# Patient Record
Sex: Female | Born: 1940 | Race: White | Hispanic: No | Smoking: Former smoker
Health system: Southern US, Community
[De-identification: ages and names within clinical notes are randomized; demographics above are authoritative.]

## PROBLEM LIST (undated history)

## (undated) DIAGNOSIS — I739 Peripheral vascular disease, unspecified: Secondary | ICD-10-CM

## (undated) DIAGNOSIS — K219 Gastro-esophageal reflux disease without esophagitis: Secondary | ICD-10-CM

## (undated) DIAGNOSIS — I480 Paroxysmal atrial fibrillation: Secondary | ICD-10-CM

## (undated) DIAGNOSIS — I779 Disorder of arteries and arterioles, unspecified: Secondary | ICD-10-CM

## (undated) DIAGNOSIS — J45909 Unspecified asthma, uncomplicated: Secondary | ICD-10-CM

## (undated) DIAGNOSIS — IMO0002 Reserved for concepts with insufficient information to code with codable children: Secondary | ICD-10-CM

## (undated) DIAGNOSIS — I771 Stricture of artery: Secondary | ICD-10-CM

## (undated) DIAGNOSIS — I10 Essential (primary) hypertension: Secondary | ICD-10-CM

## (undated) DIAGNOSIS — E785 Hyperlipidemia, unspecified: Secondary | ICD-10-CM

## (undated) DIAGNOSIS — I447 Left bundle-branch block, unspecified: Secondary | ICD-10-CM

## (undated) DIAGNOSIS — I4891 Unspecified atrial fibrillation: Secondary | ICD-10-CM

## (undated) DIAGNOSIS — I639 Cerebral infarction, unspecified: Secondary | ICD-10-CM

## (undated) DIAGNOSIS — T7840XA Allergy, unspecified, initial encounter: Secondary | ICD-10-CM

## (undated) DIAGNOSIS — J42 Unspecified chronic bronchitis: Secondary | ICD-10-CM

## (undated) DIAGNOSIS — I701 Atherosclerosis of renal artery: Secondary | ICD-10-CM

## (undated) DIAGNOSIS — I350 Nonrheumatic aortic (valve) stenosis: Secondary | ICD-10-CM

## (undated) DIAGNOSIS — I440 Atrioventricular block, first degree: Secondary | ICD-10-CM

## (undated) DIAGNOSIS — I679 Cerebrovascular disease, unspecified: Secondary | ICD-10-CM

## (undated) DIAGNOSIS — I2510 Atherosclerotic heart disease of native coronary artery without angina pectoris: Secondary | ICD-10-CM

## (undated) DIAGNOSIS — Z952 Presence of prosthetic heart valve: Secondary | ICD-10-CM

## (undated) DIAGNOSIS — M1990 Unspecified osteoarthritis, unspecified site: Secondary | ICD-10-CM

## (undated) DIAGNOSIS — R011 Cardiac murmur, unspecified: Secondary | ICD-10-CM

## (undated) DIAGNOSIS — H269 Unspecified cataract: Secondary | ICD-10-CM

## (undated) HISTORY — DX: Allergy, unspecified, initial encounter: T78.40XA

## (undated) HISTORY — PX: VAGINAL HYSTERECTOMY: SUR661

## (undated) HISTORY — DX: Cardiac murmur, unspecified: R01.1

## (undated) HISTORY — DX: Reserved for concepts with insufficient information to code with codable children: IMO0002

## (undated) HISTORY — PX: DILATION AND CURETTAGE OF UTERUS: SHX78

## (undated) HISTORY — DX: Unspecified cataract: H26.9

## (undated) HISTORY — PX: MULTIPLE TOOTH EXTRACTIONS: SHX2053

## (undated) HISTORY — PX: BASAL CELL CARCINOMA EXCISION: SHX1214

## (undated) HISTORY — DX: Cerebral infarction, unspecified: I63.9

## (undated) HISTORY — DX: Peripheral vascular disease, unspecified: I73.9

## (undated) HISTORY — DX: Atherosclerotic heart disease of native coronary artery without angina pectoris: I25.10

## (undated) HISTORY — PX: TUBAL LIGATION: SHX77

## (undated) HISTORY — DX: Nonrheumatic aortic (valve) stenosis: I35.0

## (undated) HISTORY — DX: Hyperlipidemia, unspecified: E78.5

---
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
## 2000-06-09 ENCOUNTER — Encounter: Payer: Self-pay | Admitting: Family Medicine

## 2000-06-09 ENCOUNTER — Ambulatory Visit (HOSPITAL_COMMUNITY): Admission: RE | Admit: 2000-06-09 | Discharge: 2000-06-09 | Payer: Self-pay | Admitting: Family Medicine

## 2000-12-13 ENCOUNTER — Ambulatory Visit (HOSPITAL_COMMUNITY): Admission: RE | Admit: 2000-12-13 | Discharge: 2000-12-13 | Payer: Self-pay | Admitting: Family Medicine

## 2000-12-13 ENCOUNTER — Encounter: Payer: Self-pay | Admitting: Family Medicine

## 2001-03-08 ENCOUNTER — Other Ambulatory Visit: Admission: RE | Admit: 2001-03-08 | Discharge: 2001-03-08 | Payer: Self-pay | Admitting: Family Medicine

## 2001-03-09 ENCOUNTER — Ambulatory Visit (HOSPITAL_COMMUNITY): Admission: RE | Admit: 2001-03-09 | Discharge: 2001-03-09 | Payer: Self-pay | Admitting: Family Medicine

## 2001-03-09 ENCOUNTER — Encounter: Payer: Self-pay | Admitting: Family Medicine

## 2001-03-10 ENCOUNTER — Ambulatory Visit (HOSPITAL_COMMUNITY): Admission: RE | Admit: 2001-03-10 | Discharge: 2001-03-10 | Payer: Self-pay | Admitting: Pulmonary Disease

## 2001-07-05 ENCOUNTER — Ambulatory Visit (HOSPITAL_COMMUNITY): Admission: RE | Admit: 2001-07-05 | Discharge: 2001-07-05 | Payer: Self-pay | Admitting: Family Medicine

## 2001-07-05 ENCOUNTER — Encounter: Payer: Self-pay | Admitting: Family Medicine

## 2002-03-14 ENCOUNTER — Encounter: Payer: Self-pay | Admitting: Family Medicine

## 2002-03-14 ENCOUNTER — Ambulatory Visit (HOSPITAL_COMMUNITY): Admission: RE | Admit: 2002-03-14 | Discharge: 2002-03-14 | Payer: Self-pay | Admitting: Family Medicine

## 2003-01-22 ENCOUNTER — Encounter (HOSPITAL_BASED_OUTPATIENT_CLINIC_OR_DEPARTMENT_OTHER): Admission: RE | Admit: 2003-01-22 | Discharge: 2003-04-19 | Payer: Self-pay | Admitting: Internal Medicine

## 2003-02-16 ENCOUNTER — Encounter (HOSPITAL_BASED_OUTPATIENT_CLINIC_OR_DEPARTMENT_OTHER): Admission: RE | Admit: 2003-02-16 | Discharge: 2003-04-20 | Payer: Self-pay | Admitting: Internal Medicine

## 2003-03-20 ENCOUNTER — Ambulatory Visit (HOSPITAL_COMMUNITY): Admission: RE | Admit: 2003-03-20 | Discharge: 2003-03-20 | Payer: Self-pay | Admitting: Family Medicine

## 2003-04-20 ENCOUNTER — Encounter (HOSPITAL_BASED_OUTPATIENT_CLINIC_OR_DEPARTMENT_OTHER): Admission: RE | Admit: 2003-04-20 | Discharge: 2003-07-12 | Payer: Self-pay | Admitting: Internal Medicine

## 2003-07-18 ENCOUNTER — Encounter (HOSPITAL_BASED_OUTPATIENT_CLINIC_OR_DEPARTMENT_OTHER): Admission: RE | Admit: 2003-07-18 | Discharge: 2003-08-08 | Payer: Self-pay | Admitting: Internal Medicine

## 2003-08-01 ENCOUNTER — Ambulatory Visit (HOSPITAL_COMMUNITY): Admission: RE | Admit: 2003-08-01 | Discharge: 2003-08-01 | Payer: Self-pay | Admitting: Family Medicine

## 2003-10-25 ENCOUNTER — Ambulatory Visit (HOSPITAL_COMMUNITY): Admission: RE | Admit: 2003-10-25 | Discharge: 2003-10-25 | Payer: Self-pay | Admitting: *Deleted

## 2004-04-18 ENCOUNTER — Ambulatory Visit (HOSPITAL_COMMUNITY): Admission: RE | Admit: 2004-04-18 | Discharge: 2004-04-18 | Payer: Self-pay | Admitting: Family Medicine

## 2004-04-30 ENCOUNTER — Ambulatory Visit (HOSPITAL_COMMUNITY): Admission: RE | Admit: 2004-04-30 | Discharge: 2004-04-30 | Payer: Self-pay | Admitting: Family Medicine

## 2005-04-22 ENCOUNTER — Ambulatory Visit (HOSPITAL_COMMUNITY): Admission: RE | Admit: 2005-04-22 | Discharge: 2005-04-22 | Payer: Self-pay | Admitting: Family Medicine

## 2006-03-02 HISTORY — PX: COLONOSCOPY: SHX174

## 2006-03-18 ENCOUNTER — Ambulatory Visit: Payer: Self-pay | Admitting: Internal Medicine

## 2006-03-22 ENCOUNTER — Ambulatory Visit (HOSPITAL_COMMUNITY): Admission: RE | Admit: 2006-03-22 | Discharge: 2006-03-22 | Payer: Self-pay | Admitting: Family Medicine

## 2006-04-30 ENCOUNTER — Ambulatory Visit (HOSPITAL_COMMUNITY): Admission: RE | Admit: 2006-04-30 | Discharge: 2006-04-30 | Payer: Self-pay | Admitting: Family Medicine

## 2006-06-01 ENCOUNTER — Ambulatory Visit: Payer: Self-pay | Admitting: Internal Medicine

## 2006-06-01 ENCOUNTER — Ambulatory Visit (HOSPITAL_COMMUNITY): Admission: RE | Admit: 2006-06-01 | Discharge: 2006-06-01 | Payer: Self-pay | Admitting: Internal Medicine

## 2006-07-06 ENCOUNTER — Ambulatory Visit (HOSPITAL_COMMUNITY): Admission: RE | Admit: 2006-07-06 | Discharge: 2006-07-06 | Payer: Self-pay | Admitting: Family Medicine

## 2007-03-28 ENCOUNTER — Ambulatory Visit (HOSPITAL_COMMUNITY): Admission: RE | Admit: 2007-03-28 | Discharge: 2007-03-28 | Payer: Self-pay | Admitting: Orthopaedic Surgery

## 2007-03-28 ENCOUNTER — Encounter: Payer: Self-pay | Admitting: Orthopedic Surgery

## 2007-10-19 ENCOUNTER — Ambulatory Visit: Payer: Self-pay | Admitting: Orthopedic Surgery

## 2007-10-19 DIAGNOSIS — M25559 Pain in unspecified hip: Secondary | ICD-10-CM | POA: Insufficient documentation

## 2007-10-25 ENCOUNTER — Telehealth: Payer: Self-pay | Admitting: Orthopedic Surgery

## 2007-11-01 ENCOUNTER — Encounter: Payer: Self-pay | Admitting: Orthopedic Surgery

## 2007-11-04 ENCOUNTER — Ambulatory Visit (HOSPITAL_COMMUNITY): Admission: RE | Admit: 2007-11-04 | Discharge: 2007-11-04 | Payer: Self-pay | Admitting: Family Medicine

## 2007-11-08 ENCOUNTER — Ambulatory Visit (HOSPITAL_COMMUNITY): Admission: RE | Admit: 2007-11-08 | Discharge: 2007-11-08 | Payer: Self-pay | Admitting: Family Medicine

## 2007-11-08 ENCOUNTER — Ambulatory Visit: Payer: Self-pay | Admitting: Cardiology

## 2007-11-08 ENCOUNTER — Encounter (INDEPENDENT_AMBULATORY_CARE_PROVIDER_SITE_OTHER): Payer: Self-pay | Admitting: Family Medicine

## 2007-12-06 ENCOUNTER — Encounter: Payer: Self-pay | Admitting: Orthopedic Surgery

## 2007-12-15 ENCOUNTER — Ambulatory Visit: Payer: Self-pay | Admitting: Orthopedic Surgery

## 2007-12-21 ENCOUNTER — Ambulatory Visit: Payer: Self-pay | Admitting: Orthopedic Surgery

## 2007-12-21 DIAGNOSIS — IMO0002 Reserved for concepts with insufficient information to code with codable children: Secondary | ICD-10-CM

## 2007-12-21 HISTORY — DX: Reserved for concepts with insufficient information to code with codable children: IMO0002

## 2007-12-26 ENCOUNTER — Encounter: Payer: Self-pay | Admitting: Orthopedic Surgery

## 2007-12-26 ENCOUNTER — Encounter (HOSPITAL_COMMUNITY): Admission: RE | Admit: 2007-12-26 | Discharge: 2008-01-25 | Payer: Self-pay | Admitting: Orthopedic Surgery

## 2007-12-30 ENCOUNTER — Telehealth: Payer: Self-pay | Admitting: Orthopedic Surgery

## 2008-01-06 ENCOUNTER — Telehealth: Payer: Self-pay | Admitting: Orthopedic Surgery

## 2008-01-16 ENCOUNTER — Encounter: Payer: Self-pay | Admitting: Orthopedic Surgery

## 2008-01-30 ENCOUNTER — Encounter (HOSPITAL_COMMUNITY): Admission: RE | Admit: 2008-01-30 | Discharge: 2008-02-29 | Payer: Self-pay | Admitting: Orthopedic Surgery

## 2008-01-31 ENCOUNTER — Encounter: Payer: Self-pay | Admitting: Orthopedic Surgery

## 2008-02-17 ENCOUNTER — Encounter: Payer: Self-pay | Admitting: Orthopedic Surgery

## 2008-11-27 ENCOUNTER — Ambulatory Visit (HOSPITAL_COMMUNITY): Admission: RE | Admit: 2008-11-27 | Discharge: 2008-11-27 | Payer: Self-pay | Admitting: Family Medicine

## 2009-11-28 ENCOUNTER — Ambulatory Visit (HOSPITAL_COMMUNITY): Admission: RE | Admit: 2009-11-28 | Discharge: 2009-11-28 | Payer: Self-pay | Admitting: Family Medicine

## 2010-02-11 ENCOUNTER — Ambulatory Visit (HOSPITAL_COMMUNITY)
Admission: RE | Admit: 2010-02-11 | Discharge: 2010-02-11 | Payer: Self-pay | Source: Home / Self Care | Attending: Family Medicine | Admitting: Family Medicine

## 2010-02-28 ENCOUNTER — Ambulatory Visit (HOSPITAL_COMMUNITY)
Admission: RE | Admit: 2010-02-28 | Discharge: 2010-02-28 | Payer: Self-pay | Source: Home / Self Care | Attending: Family Medicine | Admitting: Family Medicine

## 2010-03-22 ENCOUNTER — Encounter: Payer: Self-pay | Admitting: Family Medicine

## 2010-04-11 ENCOUNTER — Other Ambulatory Visit (HOSPITAL_COMMUNITY): Payer: Self-pay | Admitting: Family Medicine

## 2010-04-11 DIAGNOSIS — L819 Disorder of pigmentation, unspecified: Secondary | ICD-10-CM

## 2010-04-11 DIAGNOSIS — I739 Peripheral vascular disease, unspecified: Secondary | ICD-10-CM

## 2010-04-11 DIAGNOSIS — R52 Pain, unspecified: Secondary | ICD-10-CM

## 2010-04-16 ENCOUNTER — Ambulatory Visit (HOSPITAL_COMMUNITY)
Admission: RE | Admit: 2010-04-16 | Discharge: 2010-04-16 | Disposition: A | Discharge status: Home / Self Care | Payer: Medicare Other | Source: Ambulatory Visit | Attending: Family Medicine | Admitting: Family Medicine

## 2010-04-16 DIAGNOSIS — I739 Peripheral vascular disease, unspecified: Secondary | ICD-10-CM | POA: Insufficient documentation

## 2010-04-16 DIAGNOSIS — L819 Disorder of pigmentation, unspecified: Secondary | ICD-10-CM

## 2010-04-16 DIAGNOSIS — R52 Pain, unspecified: Secondary | ICD-10-CM

## 2010-04-24 ENCOUNTER — Encounter: Payer: Self-pay | Admitting: Orthopedic Surgery

## 2010-04-29 NOTE — Consult Note (Signed)
Summary: [facility] Rennis Golden  [facility]ult [facility] Rennis Golden   Imported By: Cammie Sickle 04/25/2010 11:23:49  _____________________________________________________________________  External Attachment:    Type:   Image     Comment:   External Document

## 2010-05-19 ENCOUNTER — Ambulatory Visit (HOSPITAL_COMMUNITY)
Admission: RE | Admit: 2010-05-19 | Discharge: 2010-05-19 | Disposition: A | Discharge status: Home / Self Care | Payer: Medicare Other | Source: Ambulatory Visit | Attending: Cardiology | Admitting: Cardiology

## 2010-05-19 ENCOUNTER — Other Ambulatory Visit (HOSPITAL_COMMUNITY): Payer: Self-pay | Admitting: Cardiology

## 2010-05-19 DIAGNOSIS — Z01818 Encounter for other preprocedural examination: Secondary | ICD-10-CM

## 2010-05-19 DIAGNOSIS — I10 Essential (primary) hypertension: Secondary | ICD-10-CM | POA: Insufficient documentation

## 2010-05-19 DIAGNOSIS — F17200 Nicotine dependence, unspecified, uncomplicated: Secondary | ICD-10-CM | POA: Insufficient documentation

## 2010-05-19 DIAGNOSIS — E119 Type 2 diabetes mellitus without complications: Secondary | ICD-10-CM | POA: Insufficient documentation

## 2010-05-23 ENCOUNTER — Observation Stay (HOSPITAL_COMMUNITY)
Admission: RE | Admit: 2010-05-23 | Discharge: 2010-05-23 | Disposition: A | Discharge status: Home / Self Care | Payer: Medicare Other | Source: Ambulatory Visit | Attending: Cardiology | Admitting: Cardiology

## 2010-05-23 DIAGNOSIS — I739 Peripheral vascular disease, unspecified: Principal | ICD-10-CM | POA: Insufficient documentation

## 2010-05-23 DIAGNOSIS — I359 Nonrheumatic aortic valve disorder, unspecified: Secondary | ICD-10-CM | POA: Insufficient documentation

## 2010-05-29 ENCOUNTER — Other Ambulatory Visit (HOSPITAL_COMMUNITY): Payer: Self-pay | Admitting: Cardiology

## 2010-05-29 DIAGNOSIS — I739 Peripheral vascular disease, unspecified: Secondary | ICD-10-CM

## 2010-05-29 DIAGNOSIS — I719 Aortic aneurysm of unspecified site, without rupture: Secondary | ICD-10-CM

## 2010-05-31 NOTE — Procedures (Signed)
NAME:  Katelyn [name], Katelyn [name] NO.:  [id number]  MEDICAL RECORD NO.:  [id number]           PATIENT TYPE:  O  LOCATION:  6522                         FACILITY:  [facility]  PHYSICIAN:  Landry Corporal, MD DATE OF BIRTH:  08-27-1940  DATE OF PROCEDURE:  05/23/2010 DATE OF DISCHARGE:  05/23/2010                   PERIPHERAL VASCULAR INVASIVE PROCEDURE   PERFORMING PHYSICIAN:  Landry Corporal, MD  PRIMARY CARDIOLOGIST:  Nicki Guadalajara, MD  PROCEDURE PERFORMED:  Abdominal aortobiiliac angiogram with bilateral lower extremity runoff.  INDICATIONS: 1. Claudication. 2. Also no evidence of severe aortoiliac disease.  BRIEF HISTORY:  Ms. Beasley is a 70 year old woman who is a long-term smoker with history of hypertension, hyperlipidemia, with a prior negative cardiac evaluation who was seen by Dr. Rennis Golden at our [place] office at [facility] when she presented with severe lower extremity left greater than right pain with rubor.  She basically has claudication with any amount of movement.  She was evaluated at [facility] with ultrasound demonstrating severe aortoiliac disease which was reconfirmed by our scans.  She has undergone a nuclear stress test which showed no evidence of ischemia, had an echocardiogram which showed moderate-to-severe aortic stenosis.  She has a history of AAA.  She is now referred for abdominal aortic angiogram with bilateral lower extremity runoff.  As there was concern for aortoiliac occlusion, decision was made to go from a left radial approach.  The risks, benefits, alternatives, and indication of the procedure were explained to the patient in detail and informed consent was obtained with a signed form and placed on the chart after all questions were answered according to the patient's satisfaction.  PROCEDURE:  The patient was brought to the [facility] in the  fasting state.  She was prepped and draped in the usual sterile fashion after a Tora Perches test was performed on the left wrist demonstrating an excellent lateral flow through the ulnar artery.  Left wrist was prepped and she was draped in the sterile fashion.  After a time-out period was performed, the patient was sedated with intravenous Versed and fentanyl.  The left radial artery was then accessed using the Seldinger technique with placement of 5-French sheath.  After the sheath was placed, it was infiltrated with 10 mL of thin radial cocktail.  Additionally, weight-based heparin bolus was administered with 50 units per kg at the time of sheath placement. Then, using a 5-French JR-4 catheter and a long fixed wire, we got into the descending aorta.  Once I was able to get into the descending aorta, the catheter was exchanged for a 5-French long pigtail catheter which was advanced down into the abdominal aorta.  Immediately, it was noted that the wire would not pass below the level what appeared to be where the renal arteries would be taken off.  For that reason, when the catheter was placed at this location, incision was made to go ahead and proceed with angiography at this location.  Digital subtraction angiography was then performed using first 20 mL contrast with 20 seconds and then 20 mL contrast with 20 seconds with  delay on the pelvis.  Then, once this was complete demonstrating total occlusion of the abdominal aorta below the renal arteries delayed imaging, first the pelvis followed by bilateral lower extremity runoff was performed. After completion of this, it was determined that this concluded the diagnostic portion of the study and the pigtail catheter was removed completely out of the available wire and the sheath was then removed from wrist and a TR band placed in the cath lab.  The patient was then returned to the postprocedure suite for ongoing care.  The patient was stable  before, during, and after the procedure.  There were no complications.  The estimated blood loss was less than 10 mL.  CATH LAB STATISTICS: 1. Contrast was 126 mL. 2. Sedation with 1 mg of IV Versed and 25 mcg of IV fentanyl. 3. Radial cocktail was 400 mcg of nitroglycerin, 5 mg of verapamil and     2 mL of 1% lidocaine.  ANGIOGRAPHIC FINDINGS: 1. The abdominal aorta is widely patent, renal arteries bilaterally     with the right renal veins somewhat cephalad to the left.  Superior     mesenteric artery also arises at this location.  Just distal to     each renal artery in that angle - the aorta is 100% occluded.     There is no distal flow noted in the aorta down to the iliacs.     There is evidence of collateral flow filling via hypogastric and     profunda arteries on the left backfilling up into the common iliac.     Unfortunately, there is no collateral flow noted in the right     common iliac. 2. Superior mesenteric artery is a large vessel with no significant     stenosis. 3. Bilateral renal arteries are normal in caliber with no stenosis. 4. The right common iliac appears calcified but does not fill via     collaterals.  On peripheral runoff, the continuation of right     common iliac with the external and internal iliacs also do not fill     with contrast.  Due to no filling of contrast throughout the entire     right lower extremity system with the right SFA and popliteal and     tibial trifurcation not filling.  There was no evidence of any     runoff distally. 5. Left leg system does backfill as mentioned into the common iliac     with no significant disease in the common iliac, but heavy     calcification but no significant stenosis in the common iliac or     internal iliac, SFA or profunda until the mid distal SFA what     appears to be heavily calcified and likely at least may be occluded     as there does not appear to be significant flow beyond the adductor      canal.  In the popliteal region, it is not notable for and there is     only single vessel ruff off where the posterior tibial     reconstitutes but does not reach all the way down to the foot.  The     anterior tibial and peroneal arteries are not visualized.  IMPRESSION: 1. 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 2. Reconstitution only left lower extremity artery system via this     angiogram with no evidence of fill in the right lower extremity.  PLAN: 1. At this  time, we will discontinue this procedure as she will be     discharged home postprocedure.  Follow up will be in clinic. 2. Plan is to evaluate the significance for the severity of the aortic     valve stenoses noted in echocardiogram and by left and right heart     catheterization to estimate pressure gradient and valve area. 3. We will contact Dr. Myra Gianotti to refer for possibly bi-fem surgery.     It is likely that she would require CT abdomen and pelvis as we     were not able to fully visualize her peripheral vascular system.  Pending any further procedures, we will restart her back on warfarin and continue antibiotics as well as blood pressure control.          ______________________________ Landry Corporal, MD     DWH/MEDQ  D:  05/30/2010  T:  05/30/2010  Job:  [id number]  cc:   Italy Hilty, MD V. Charlena Cross, MD [facility]  Electronically Signed by Bryan Lemma MD on 05/31/2010 04:54:09 AM

## 2010-06-02 ENCOUNTER — Other Ambulatory Visit (HOSPITAL_COMMUNITY): Payer: Self-pay | Admitting: Cardiology

## 2010-06-02 ENCOUNTER — Encounter (HOSPITAL_COMMUNITY): Payer: Self-pay

## 2010-06-02 ENCOUNTER — Ambulatory Visit (HOSPITAL_COMMUNITY)
Admission: RE | Admit: 2010-06-02 | Discharge: 2010-06-02 | Disposition: A | Discharge status: Home / Self Care | Payer: Medicare Other | Source: Ambulatory Visit | Attending: Cardiology | Admitting: Cardiology

## 2010-06-02 DIAGNOSIS — I739 Peripheral vascular disease, unspecified: Secondary | ICD-10-CM | POA: Insufficient documentation

## 2010-06-02 DIAGNOSIS — R9389 Abnormal findings on diagnostic imaging of other specified body structures: Secondary | ICD-10-CM | POA: Insufficient documentation

## 2010-06-02 DIAGNOSIS — I70219 Atherosclerosis of native arteries of extremities with intermittent claudication, unspecified extremity: Secondary | ICD-10-CM | POA: Insufficient documentation

## 2010-06-02 DIAGNOSIS — I719 Aortic aneurysm of unspecified site, without rupture: Secondary | ICD-10-CM

## 2010-06-02 DIAGNOSIS — I7409 Other arterial embolism and thrombosis of abdominal aorta: Secondary | ICD-10-CM | POA: Insufficient documentation

## 2010-06-02 DIAGNOSIS — I701 Atherosclerosis of renal artery: Secondary | ICD-10-CM | POA: Insufficient documentation

## 2010-06-02 DIAGNOSIS — I708 Atherosclerosis of other arteries: Secondary | ICD-10-CM | POA: Insufficient documentation

## 2010-06-02 HISTORY — DX: Essential (primary) hypertension: I10

## 2010-06-02 MED ORDER — IOHEXOL 350 MG/ML SOLN: 100.0000 mL | Freq: Once | INTRAVENOUS | Status: AC | PRN

## 2010-06-04 ENCOUNTER — Ambulatory Visit (HOSPITAL_COMMUNITY)
Admission: RE | Admit: 2010-06-04 | Discharge: 2010-06-04 | Disposition: A | Discharge status: Home / Self Care | Payer: Medicare Other | Source: Ambulatory Visit | Attending: Cardiology | Admitting: Cardiology

## 2010-06-04 DIAGNOSIS — Z7901 Long term (current) use of anticoagulants: Secondary | ICD-10-CM | POA: Insufficient documentation

## 2010-06-04 DIAGNOSIS — I359 Nonrheumatic aortic valve disorder, unspecified: Secondary | ICD-10-CM | POA: Insufficient documentation

## 2010-06-04 DIAGNOSIS — I70219 Atherosclerosis of native arteries of extremities with intermittent claudication, unspecified extremity: Secondary | ICD-10-CM | POA: Insufficient documentation

## 2010-06-04 DIAGNOSIS — I2510 Atherosclerotic heart disease of native coronary artery without angina pectoris: Secondary | ICD-10-CM | POA: Insufficient documentation

## 2010-06-04 LAB — POCT I-STAT 3, VENOUS BLOOD GAS (G3P V)
Bicarbonate: 22.3 mEq/L (ref 20.0–24.0)
Bicarbonate: 23 mEq/L (ref 20.0–24.0)
O2 Saturation: 68 %
O2 Saturation: 64 %
TCO2: 24 mmol/L (ref 0–100)
TCO2: 24 mmol/L (ref 0–100)
pCO2, Ven: 42.5 mmHg — ABNORMAL LOW (ref 45.0–50.0)
pCO2, Ven: 42.6 mmHg — ABNORMAL LOW (ref 45.0–50.0)
pH, Ven: 7.329 — ABNORMAL HIGH (ref 7.250–7.300)
pH, Ven: 7.34 — ABNORMAL HIGH (ref 7.250–7.300)
pO2, Ven: 35 mmHg (ref 30.0–45.0)

## 2010-06-04 LAB — CBC
Hemoglobin: 12.5 g/dL (ref 12.0–15.0)
MCH: 30 pg (ref 26.0–34.0)
MCV: 89.4 fL (ref 78.0–100.0)
Platelets: 162 10*3/uL (ref 150–400)
RBC: 4.16 MIL/uL (ref 3.87–5.11)
WBC: 4.2 10*3/uL (ref 4.0–10.5)

## 2010-06-04 LAB — PROTIME-INR: Prothrombin Time: 13.1 seconds (ref 11.6–15.2)

## 2010-06-04 LAB — POCT ACTIVATED CLOTTING TIME: Activated Clotting Time: 152 seconds

## 2010-06-04 LAB — BASIC METABOLIC PANEL
CO2: 24 mEq/L (ref 19–32)
Chloride: 110 mEq/L (ref 96–112)
GFR calc Af Amer: >60 mL/min (ref >60)
Potassium: 3.3 mEq/L — ABNORMAL LOW (ref 3.5–5.1)

## 2010-06-04 LAB — POCT I-STAT 3, ART BLOOD GAS (G3+)
Bicarbonate: 21.6 mEq/L (ref 20.0–24.0)
TCO2: 23 mmol/L (ref 0–100)
pCO2 arterial: 37.3 mmHg (ref 35.0–45.0)
pH, Arterial: 7.372 (ref 7.350–7.400)
pO2, Arterial: 67 mmHg — ABNORMAL LOW (ref 80.0–100.0)

## 2010-06-06 NOTE — Procedures (Signed)
NAME:  [name], [name] NO.:  [id number]  MEDICAL RECORD NO.:  [id number]           PATIENT TYPE:  O  LOCATION:  6522                         FACILITY:  [facility]  PHYSICIAN:  Landry Corporal, MD DATE OF BIRTH:  1940-09-28  DATE OF PROCEDURE:  06/04/2010 DATE OF DISCHARGE:  06/04/2010                           CARDIAC CATHETERIZATION   PRIMARY CARDIOLOGIST:  Nicki Guadalajara, MD  PROCEDURE PERFORMED: 1. Left heart catheterization via 5-French right radial artery access.     No left ventriculogram performed as the patient has had adequate     evaluation of LV function. 2. Right heart catheterization via the right IJ with measurement of     thermodilution cardiac outputs. 3. Native coronary angiography. 4. Intracoronary nitroglycerin injection.  PERFORMING PHYSICIAN:  Landry Corporal, MD  INDICATIONS: 1. Significant peripheral vascular disease with occlusive aortic     disease consistent Leriche syndrome. 2. Echocardiographic evidence of at least moderate aortic stenosis.  HISTORY:  [name] is a very pleasant 69-year woman who was seen for bilateral with greater than right severe lifestyle-limiting claudication.  Doppler evaluation indicated likely high-grade aortoiliac disease.  She was then referred for peripheral angiography performed via the left radial approach which demonstrated flush occlusion of the aorta just below the renal arteries.  There was only mild reconstitution of left lower extremity side.  Subsequently, underwent CT angiography which revealed delayed filling bilateral lower extremities with moderate-to- severe disease distally in the femoral and tibial vessels.  The aorta itself was occluded from the point just below the infrarenal segment to the bifurcation.  This was in the segment of a known small aneurysmal dilatation.  The patient was likely referred for significant vascular surgery with concern for aortic stenosis as well as  the given the extensive peripheral vascular disease even though there was a negative Myoview, there was concern of ischemia on stress test.  The patient was referred for right and left heart catheterization to evaluate the aortic valve area as well as right coronary arteries.  The risks, benefits, alternatives, and indication of the procedure was explained to the patient in detail and informed consent was obtained and placed on the chart.  PROCEDURE:  The patient was brought to the [facility] from the short-stay area in the fasting state.  She was prepped and draped with the right radial artery access site and the right IJ access site prepared.  A modified [name]/Doppler test was performed by plethysmography to evaluate the right ulnar collateral flow, which was deemed to be acceptable.  A time-out period was performed and the patient was unit sedated with engage for intravenous Versed and fentanyl.  First, the right neck was anesthetized using 1% subcutaneous lidocaine and using direct ultrasound visualization, the right internal jugular vein was accessed using the modified Seldinger technique with placement of a 7-French sheath.  Sheath was aspirated and flushed and a 7-French Swan-Ganz catheter was advanced into the first in right atrium and then through the right atrium into the right ventricle and subsequently into the pulmonary artery and then into the wedge position.  Pressure measures were recorded in each segment.  Balloon was then deflated and cardiac outputs were measured with 3 successive injections at 10 mL of saline to calculate using the thermodilution method.  The catheter was then left in the pulmonary artery for subsequent measurement of pulmonary arterial oxygen saturations in extension with aortic arterial saturations, also for measurement of simultaneous aortic pressure recordings.  Attention was turned to the radial  artery access and the right wrist was anesthetized using 1% subcutaneous lidocaine and the right radial artery was then accessed easily with the Seldinger technique with placement of 5-French sheath.  The sheath was aspirated and flushed and a total 10 mL of radial cocktail was infiltrated through the sheath with saline flushing.  After this was done, a weight-based heparin bolus roughly 50 units/kg was administered for anticoagulation.  Then, a 5-French TIG 4-0 catheter was advanced over J-wire into the ascending aorta.  It was used to engage first the left and then the right coronary artery.  Multiple angiographic views of first the left and the right coronary system were obtained.  With the catheter in the right coronary artery, 200 mcg injection of intracoronary nitroglycerin was administered followed by a repeat angiography.  After completion of angiography, attempt was made to cross the aortic valve using a straight Versacore wire which was unsuccessful.  After several attempts ensuring adequate timing intervals, we removed the wire for wiping.  After this was unsuccessful, the catheter was exchanged over J-wire for a 5-French JR-4 catheter which again multiple attempts were made first with Versacore wire and then a long angled Glidewire.  As these attempts were unsuccessful, the catheter was exchanged over a guidewire for a 5-French MP1 catheter which was then used to again attempt to cross the aortic valve with the long angled Glidewire which was successfully used to cross the aortic valve.  The multipurpose catheter was advanced over the wire into the left ventricle and the wire was removed.  Left ventricular hemodynamics were then obtained.  Also, simultaneous left ventricular and right ventricular pressures were measured showing concordance of pressure waves.  There was no significant evidence of valvular regurgitation on the pulmonary arterial waveforms in conjunction with the  left ventricular pressures.  Prior to crossing the aortic valve, simultaneous central aortic and pulmonary arterial pressures were measured for calculating cardiac output by Fick method.  Now, with the catheter in the left ventricle, left ventricular hemodynamics were measured and then the catheter was pulled back across the aortic valve to measure the pullback gradient as the [facility] catheter was not available.  TIG catheter was then removed completely out of body over wire without any complication.  The sheath was then removed in the catheterization lab with placement of TR band with adequate nonocclusive hemostasis measured by plethysmography.  The estimated blood loss was less than 10 mL other than for samples.  There were no complications.  The patient was stable after the procedure.  Also, the right internal jugular vein sheath was then removed after measurement of ACT.  Adequate hemostasis was obtained there as well.  Multiple calculations were performed to evaluate the aortic valve area eventually by Hakki formula.  CATH LAB STATISTICS: 1. Sedation 1 mg of Versed and 25 mcg of fentanyl. 2. Radial cocktail 2 mL of 1% subcutaneous lidocaine, 5 mg of     verapamil, 400 mcg of nitroglycerin, and 10 mL of normal saline.  CONTRAST: 1. Total of 125 mL was administered. 2. Intracoronary nitroglycerin 200 mcg  injection. 3. Standard weight-based heparin bolus of 50 units/kg was administered     at the time of sheath placement.  RIGHT HEART CATHETERIZATION/HEMODYNAMIC: 1. Right atrial pressure of 5/2 mmHg. 2. Right ventricular pressures 20/0 mmHg, RVEDP of 4 mmHg. 3. PA pressures 18/7 mmHg; mean PA pressure of 13 mmHg. 4. Pulmonary capillary wedge pressure 10/9 mmHg. 5. Cardiac output by Fick was 5.13 with a cardiac index 2.7. 6. Cardiac output by thermodilution was 4.76 with a cardiac index of     2.51.  Simultaneous Hemodynamics: 1. LVP 166/11 mmHg; LVEDP of 16 mmHg; PCWP  15/11 mmH, mean of . 2. LVP 154/12 mmHg; RVP 30/2 mmHg.  Pressure readings at time of pullback, left ventricular pressures was 154/12 mmHg mmHg with EDP of 8 mmHg and aortic pressure was 128/57 mmHg with a mean of 84 mmHg.  Estimate peak-to-peak of 26 mmHg (22-2mmHg)   OXYGEN SATURATIONS:  Aortic saturation 93% and PA saturation was 64%.  Calculation of the aortic valve area using the Hakki equation and a peak- to-peak of 26 was estimated at 0.9 cm.  Peak gradient of 22-36.  The mean gradient was not actively measured.  ANGIOGRAPHIC FINDINGS: 1. Left main is a short vessel which bifurcates into the circumflex     and left LAD.  No significant disease. 2. The LAD is a moderate-sized vessel which tapers into small vessel     distally.  A  large first diagonal branch with 50% proximal     stenosis.  There is another small diagonal followed by third     diagonal which is relatively and larger than the distal portion of     the LAD.  No significant disease in the rest of the LAD system.     The distal LAD and septal branches give collaterals to the     posterior descending artery via septals and then distal LAD. 3. The circumflex artery has a 40% proximal and mid stenoses.  It then     bifurcates into an obtuse marginal branch with 40-50% proximal     stenosis.  The atrioventricular artery is a relatively small     vessel, but it gives collaterals to the right posterolateral     system. 4. The right coronary artery is a relatively small caliber vessel     which did not get much bigger after nitroglycerin injection.  There     was a 70-80% ostial/proximal stenosis and the vessel is chronically     occluded at the branch point of the marginal branch.  This marginal     branch then courses across the acute margin and gives collateral     flows to the AV nodal artery and the right posterolateral branches     backfilling the entire remaining portion of the right coronary     artery up  to another acute marginal branch.  There is only a small     segment of the right coronary artery which does not have flow and     there is some mild bridging collaterals at this location.  No     improvement of flow noted with IV nitroglycerin.  IMPRESSION: 1. Moderate aortic stenosis by gradient peak of somewhere between 22     and 36 mmHg, but not significant.  Aortic valve area was measured     at 0.9.  Based on that valve area, it will be considered moderate     to severe, but not severe, similar to the  echocardiographic     evaluation. 2. 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 3. Moderate, but nonobstructive coronary artery disease in the ostial,     proximal circumflex as well as OM as well as V1.  PLAN: 1. Standard post cath care in the radial lounge.  I will consult with     Dr. Myra Gianotti here today versus in clinic for consideration for     aortobifemoral bypass. 2. She can continue her warfarin therapy upon discharge unless told     otherwise by Dr. Myra Gianotti.          ______________________________ Landry Corporal, MD     DWH/MEDQ  D:  06/04/2010  T:  06/05/2010  Job:  [id number]  cc:   Nicki Guadalajara, M.D. [facility] V. Charlena Cross, MD  Electronically Signed by Bryan Lemma MD on 06/06/2010 09:57:04 AM

## 2010-06-06 NOTE — Discharge Summary (Signed)
  NAME:  Katelyn [name], Katelyn [name]                ACCOUNT NO.:  [id number]  MEDICAL RECORD NO.:  [id number]           PATIENT TYPE:  O  LOCATION:  6522                         FACILITY:  [facility]  PHYSICIAN:  Landry Corporal, MD DATE OF BIRTH:  Oct 13, 1940  DATE OF ADMISSION:  05/23/2010 DATE OF DISCHARGE:  05/23/2010                              DISCHARGE SUMMARY   ADMITTING DIAGNOSIS:  Severe claudication left greater than right with concern for severe aortoiliac disease.  DISCHARGE DIAGNOSES: 1. 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 2. Hypertension. 3. Hyperlipidemia. 4. Chronic smoking. 5. A least moderate-to-severe aortic stenosis by echocardiogram. 6. Chronic low back pain.  PROCEDURE PERFORMED:  Abdominal aortobiiliac angiogram with bilateral lower extremity runoff via the left radial artery.  BRIEF HOSPITAL COURSE:  Ms. Sizer was brought in for via the short stay holding area for outpatient peripheral angiogram.  This was performed in the [facility].  After completion of the study, the patient was transferred to the postprocedure area where she was monitored per intracardiac post-radial catheterization protocol. The usual plan was for her to be seen by Vascular Surgery; however, the timing of this was not such that she will be always seen prior to being discharged.  She was then discharged in stable condition after adequate post-radial cath observation.  The plan was for her to follow up with myself at [facility].  In the following week, at which time we would decide what the plan will be after I would have time to discuss with Dr. Myra Gianotti for his recommendations.  DISCHARGE MEDICATIONS:  The same as her home medications. 1. She had been holding her warfarin, which she would not continue on     discharge.  So, she is taking  warfarin according to her previous     dosing. 2. Meloxicam 15 mg daily. 3. Premarin 1.25 mg daily. 4. Folbic tab one tablet daily. 5. Verapamil 120 mg daily. 6. Crestor 20 mg daily. 7. Amlodipine 10 mg daily. 8. Cilostazol 100 mg b.i.d. 9. Plavix 75 mg daily.  FOLLOWUP PLAN:  She is to be scheduled to see myself, Dr. Herbie Baltimore and will contact by our clinic at the time for followup visit.  Otherwise, she is discharged in stable condition and will be contacted with further studies that may be performed.          ______________________________ Landry Corporal, MD     DWH/MEDQ  D:  06/05/2010  T:  06/05/2010  Job:  [id number]  Electronically Signed by Bryan Lemma MD on 06/06/2010 09:49:29 AM

## 2010-06-12 HISTORY — PX: PR VEIN BYPASS GRAFT,AORTO-FEM-POP: 35551

## 2010-06-13 NOTE — Discharge Summary (Signed)
  [name], LINS NO.:  [id number]  MEDICAL RECORD NO.:  [id number]           PATIENT TYPE:  O  LOCATION:  6522                         FACILITY:  [facility]  PHYSICIAN:  Landry Corporal, MD DATE OF BIRTH:  1940/07/25  DATE OF ADMISSION:  06/04/2010 DATE OF DISCHARGE:  06/04/2010                              DISCHARGE SUMMARY   ADMITTING DIAGNOSES: 1. Severe aortoiliac occlusive disease. 2. Moderate-to-severe aortic stenosis. 3. Significant cardiac risk factors with negative Persantine Myoview.  DISCHARGE DIAGNOSES: 1. Moderate to moderately severe aortic stenosis without significant     pressure gradient. 2. Single vessel coronary artery disease with excellent     collateralizations. 3. Hypertension. 4. Occlusive aortoiliac disease as previously noted.  PROCEDURE PERFORMED:  Left and right heart catheterization from 5-French right radial and 7-French right IJ access.  HOSPITAL COURSE:  Katelyn [name] came to the hospital to the short-stay department.  She was brought to the cardiac catheterization lab, underwent right and left heart catheterization with the results dictated separately.  She tolerated the procedure well.  Sheaths were removed in the cath lab and the patient had no complications before, during, and after the procedure.  She waited in the postprocedure radial lounge recovery suite to see if she can be seen by Katelyn [name] prior to discharge, however, as Katelyn [name] was called to emergency procedures, he was unable to see her in the hospitalization.  Therefore, the plan was for him to see her in outpatient consultation.  I did discuss the case results with him and he has reviewed the charts.  Her blood pressure was a little elevated, but was found to be under relatively good control by the time of discharge.  No change to her discharge medications and she will restart the warfarin until told otherwise by Katelyn [name].  She continues her  other home medications including: 1. Zyrtec 10 mg daily. 2. Warfarin 3 mg daily. 3. Verapamil SR 120 mg daily. 4. Premarin 1.25 mg daily. 5. Plavix 75 mg daily. 6. Meloxicam 15 mg daily. 7. Folic acid tablets daily. 8. Crestor 20 mg daily. 9. Cilostazol 100 mg b.i.d. 10.Amlodipine 10 mg daily.  After standard postprocedural evaluation time, she was stable for discharge without complications.  She will follow up with me at [facility] and Vascular as well as for Cardiology followup and we will follow up with her primary cardiologist, Katelyn [name]. Followup appointment scheduled to see Katelyn [name] at the [facility] for consideration for possible aortobifem bypass surgery.          ______________________________ Landry Corporal, MD     DWH/MEDQ  D:  06/10/2010  T:  06/11/2010  Job:  [id number]  cc:   [name], MD [name], M.D.  Electronically Signed by Bryan Lemma MD on 06/13/2010 04:38:45 PM

## 2010-06-16 ENCOUNTER — Encounter (INDEPENDENT_AMBULATORY_CARE_PROVIDER_SITE_OTHER): Payer: Medicare Other | Admitting: Surgery

## 2010-06-16 DIAGNOSIS — I714 Abdominal aortic aneurysm, without rupture, unspecified: Secondary | ICD-10-CM

## 2010-06-16 DIAGNOSIS — I7092 Chronic total occlusion of artery of the extremities: Secondary | ICD-10-CM

## 2010-06-17 NOTE — Assessment & Plan Note (Signed)
OFFICE VISIT  [name], [name] DOB:  Jan 26, 1941                                       06/16/2010 ZOXWR#:[id number]  REASON FOR VISIT:  Bilateral claudication.  HISTORY:  This is a 70 year old female that I am seeing at the request of Dr. Herbie Baltimore for evaluation of aortic occlusion and lifestyle limiting claudication.  The patient states that she has been having symptoms for at least a year but it has gotten worse over the past 5 to 6 months. She can now only walk approximately 20 feet before her legs start hurting and they give out and she has to rest.  Her left leg bothers her worse than the right.  She does complain of mild rest pain.  She has an area at the base of the left fifth toe which has started to scab.  It is an area where she has scratched.  The patient has known moderate to severe aortic stenosis.  She has recently undergone heart catheterization and has been cleared for surgery.  She has had a negative Myoview done this month.  She is maintained on Coumadin and Plavix.  Ultrasound was performed that shows ABI of 9.37 on the right, 0.33 on the left.  The patient denies chest pain, shortness of breath.  She denies neurological symptoms of numbness, weakness, amaurosis fugax or slurred speech.  PAST MEDICAL HISTORY:  Aortic occlusive disease, moderate to severe aortic stenosis, hypertension and chronic tobacco abuse.  REVIEW OF SYSTEMS:  VASCULAR:  Positive for pain in legs with walking and with lying flat, stroke in 1982. CARDIAC:  Positive for heart murmur. MUSCULOSKELETAL:  Positive for muscle pain. All other review of systems were negative as documented in the encounter form.  SOCIAL HISTORY:  She is married, retired with 1 child.  Smokes 1 pack of cigarettes a week.  Does not drink.  FAMILY HISTORY:  Unknown per the patient.  ALLERGIES:  Are to adhesive and latex which causes a rash.  HOME MEDICATIONS:  Include Crestor, Coumadin,  Zyrtec, Foltx, meloxicam, verapamil, Premarin, Pletal, Plavix and amlodipine.  PHYSICAL EXAMINATION:  Vital signs:  Heart rate 90, blood pressure 145/73, O2 sat 98%.  General:  She is well-appearing, in no distress. HEENT:  Within normal limits.  Sclerae anicteric.  Nares without drainage.  Lungs:  Clear bilaterally.  No wheezes or rhonchi. Cardiovascular:  Regular rhythm with 4/6 systolic ejection murmur which extends up in the bilateral carotids.  Pedal and popliteal and femoral pulses are not palpable.  Abdomen:  Soft, nontender.  No surgical scars. No hernias.  Musculoskeletal:  Without major deformity.  Neurological: No focal deficits.  Skin:  Without rash.  There is an area of eschar approximately 1 mm on the lateral side of the left fifth metatarsal head.  DIAGNOSTIC STUDIES:  I reviewed her angiogram as well as her CT angiogram which show aortic occlusion at the level of the renal arteries.  There is reconstitution of the external iliac from collaterals.  Femoral and popliteal vessels are patent.  ASSESSMENT:  Aortic occlusive disease.  PLAN:  I discussed proceeding with aortobifemoral bypass graft.  The risks and benefits were discussed with the patient including the risk of cardiopulmonary complications, the risk of bleeding, the risk of intestinal complications, hernia, infection and blood loss.  All these were discussed with the patient.  I diagrammed out  the operation as well as discussed the incisions that will be required.  She understands there is a 5-7 day hospital stay and it will probably take her 3-6 months to fully recover but she should have significant improvement in her quality of life.  I have scheduled her operation for Friday, April 27.  She is going to have to come off of her Plavix, Coumadin and cilostazol prior to her operation.    [name], MD Electronically Signed  VWB/MEDQ  D:  06/16/2010  T:  06/17/2010  Job:  [id number]  cc:   Dr  Lanny Hurst. Renard Matter, MD

## 2010-06-24 ENCOUNTER — Ambulatory Visit (HOSPITAL_COMMUNITY)
Admission: RE | Admit: 2010-06-24 | Discharge: 2010-06-24 | Disposition: A | Discharge status: Home / Self Care | Payer: Medicare Other | Source: Ambulatory Visit | Attending: Surgery | Admitting: Surgery

## 2010-06-24 ENCOUNTER — Other Ambulatory Visit: Payer: Self-pay | Admitting: Surgery

## 2010-06-24 ENCOUNTER — Encounter (HOSPITAL_COMMUNITY)
Admission: RE | Admit: 2010-06-24 | Discharge: 2010-06-24 | Disposition: A | Discharge status: Home / Self Care | Payer: Medicare Other | Source: Ambulatory Visit | Attending: Surgery | Admitting: Surgery

## 2010-06-24 DIAGNOSIS — Z01812 Encounter for preprocedural laboratory examination: Secondary | ICD-10-CM | POA: Insufficient documentation

## 2010-06-24 DIAGNOSIS — Z01810 Encounter for preprocedural cardiovascular examination: Secondary | ICD-10-CM | POA: Insufficient documentation

## 2010-06-24 DIAGNOSIS — I70209 Unspecified atherosclerosis of native arteries of extremities, unspecified extremity: Secondary | ICD-10-CM

## 2010-06-24 DIAGNOSIS — I10 Essential (primary) hypertension: Secondary | ICD-10-CM | POA: Insufficient documentation

## 2010-06-24 DIAGNOSIS — F17200 Nicotine dependence, unspecified, uncomplicated: Secondary | ICD-10-CM | POA: Insufficient documentation

## 2010-06-24 DIAGNOSIS — Z01818 Encounter for other preprocedural examination: Secondary | ICD-10-CM | POA: Insufficient documentation

## 2010-06-24 LAB — COMPREHENSIVE METABOLIC PANEL
ALT: 22 U/L (ref 0–35)
AST: 29 U/L (ref 0–37)
Alkaline Phosphatase: 72 U/L (ref 39–117)
CO2: 20 mEq/L (ref 19–32)
Chloride: 109 mEq/L (ref 96–112)
GFR calc Af Amer: >60 mL/min (ref >60)
GFR calc non Af Amer: >60 mL/min (ref >60)
Sodium: 137 mEq/L (ref 135–145)
Total Bilirubin: 0.4 mg/dL (ref 0.3–1.2)

## 2010-06-24 LAB — CBC
Hemoglobin: 14.4 g/dL (ref 12.0–15.0)
MCV: 89.6 fL (ref 78.0–100.0)
Platelets: 149 10*3/uL — ABNORMAL LOW (ref 150–400)
RBC: 4.7 MIL/uL (ref 3.87–5.11)
WBC: 5.8 10*3/uL (ref 4.0–10.5)

## 2010-06-24 LAB — BLOOD GAS, ARTERIAL
Acid-base deficit: 2 mmol/L (ref 0.0–2.0)
Bicarbonate: 22 mEq/L (ref 20.0–24.0)
Drawn by: 206361
pCO2 arterial: 35.6 mmHg (ref 35.0–45.0)
pO2, Arterial: 82.3 mmHg (ref 80.0–100.0)

## 2010-06-24 LAB — URINALYSIS, ROUTINE W REFLEX MICROSCOPIC
Glucose, UA: NEGATIVE mg/dL
Leukocytes, UA: NEGATIVE
Protein, ur: NEGATIVE mg/dL
Specific Gravity, Urine: 1.018 (ref 1.005–1.030)
Urobilinogen, UA: 0.2 mg/dL (ref 0.0–1.0)

## 2010-06-24 LAB — URINE MICROSCOPIC-ADD ON

## 2010-06-27 ENCOUNTER — Inpatient Hospital Stay (HOSPITAL_COMMUNITY)
Admission: RE | Admit: 2010-06-27 | Discharge: 2010-07-02 | DRG: 238 | Disposition: A | Discharge status: Home / Self Care | Payer: Medicare Other | Source: Ambulatory Visit | Attending: Surgery | Admitting: Surgery

## 2010-06-27 ENCOUNTER — Other Ambulatory Visit: Payer: Self-pay | Admitting: Surgery

## 2010-06-27 ENCOUNTER — Inpatient Hospital Stay (HOSPITAL_COMMUNITY): Payer: Medicare Other

## 2010-06-27 DIAGNOSIS — Z8673 Personal history of transient ischemic attack (TIA), and cerebral infarction without residual deficits: Secondary | ICD-10-CM

## 2010-06-27 DIAGNOSIS — I2510 Atherosclerotic heart disease of native coronary artery without angina pectoris: Secondary | ICD-10-CM | POA: Diagnosis present

## 2010-06-27 DIAGNOSIS — I7092 Chronic total occlusion of artery of the extremities: Secondary | ICD-10-CM

## 2010-06-27 DIAGNOSIS — Z01812 Encounter for preprocedural laboratory examination: Secondary | ICD-10-CM

## 2010-06-27 DIAGNOSIS — I7409 Other arterial embolism and thrombosis of abdominal aorta: Principal | ICD-10-CM | POA: Diagnosis present

## 2010-06-27 DIAGNOSIS — I10 Essential (primary) hypertension: Secondary | ICD-10-CM | POA: Diagnosis present

## 2010-06-27 DIAGNOSIS — Z8614 Personal history of Methicillin resistant Staphylococcus aureus infection: Secondary | ICD-10-CM

## 2010-06-27 DIAGNOSIS — K560 Paralytic ileus: Secondary | ICD-10-CM | POA: Diagnosis not present

## 2010-06-27 DIAGNOSIS — Z01818 Encounter for other preprocedural examination: Secondary | ICD-10-CM

## 2010-06-27 DIAGNOSIS — F17200 Nicotine dependence, unspecified, uncomplicated: Secondary | ICD-10-CM | POA: Diagnosis present

## 2010-06-27 DIAGNOSIS — Z01810 Encounter for preprocedural cardiovascular examination: Secondary | ICD-10-CM

## 2010-06-27 HISTORY — PX: AORTO-FEMORAL BYPASS GRAFT: SHX885

## 2010-06-27 LAB — CBC
MCH: 30.2 pg (ref 26.0–34.0)
MCHC: 33.6 g/dL (ref 30.0–36.0)
Platelets: 115 10*3/uL — ABNORMAL LOW (ref 150–400)

## 2010-06-27 LAB — BASIC METABOLIC PANEL
CO2: 24 mEq/L (ref 19–32)
Calcium: 7.4 mg/dL — ABNORMAL LOW (ref 8.4–10.5)
Creatinine, Ser: 0.85 mg/dL (ref 0.4–1.2)
GFR calc Af Amer: >60 mL/min (ref >60)

## 2010-06-27 LAB — MAGNESIUM: Magnesium: 1.6 mg/dL (ref 1.5–2.5)

## 2010-06-27 LAB — APTT: aPTT: 30 seconds (ref 24–37)

## 2010-06-28 ENCOUNTER — Inpatient Hospital Stay (HOSPITAL_COMMUNITY): Payer: Medicare Other

## 2010-06-28 LAB — COMPREHENSIVE METABOLIC PANEL
ALT: 18 U/L (ref 0–35)
Calcium: 7.2 mg/dL — ABNORMAL LOW (ref 8.4–10.5)
Glucose, Bld: 227 mg/dL — ABNORMAL HIGH (ref 70–99)
Sodium: 134 mEq/L — ABNORMAL LOW (ref 135–145)
Total Protein: 4.6 g/dL — ABNORMAL LOW (ref 6.0–8.3)

## 2010-06-28 LAB — GLUCOSE, CAPILLARY
Glucose-Capillary: 147 mg/dL — ABNORMAL HIGH (ref 70–99)
Glucose-Capillary: 118 mg/dL — ABNORMAL HIGH (ref 70–99)

## 2010-06-28 LAB — CBC
HCT: 35.1 % — ABNORMAL LOW (ref 36.0–46.0)
Hemoglobin: 11.7 g/dL — ABNORMAL LOW (ref 12.0–15.0)
RBC: 3.9 MIL/uL (ref 3.87–5.11)
WBC: 9.9 10*3/uL (ref 4.0–10.5)

## 2010-06-28 LAB — MAGNESIUM: Magnesium: 2.2 mg/dL (ref 1.5–2.5)

## 2010-06-29 LAB — BASIC METABOLIC PANEL
Calcium: 7.6 mg/dL — ABNORMAL LOW (ref 8.4–10.5)
Creatinine, Ser: 0.83 mg/dL (ref 0.4–1.2)
GFR calc Af Amer: >60 mL/min (ref >60)
GFR calc non Af Amer: >60 mL/min (ref >60)
Sodium: 139 mEq/L (ref 135–145)

## 2010-06-29 LAB — CBC
MCHC: 32 g/dL (ref 30.0–36.0)
RDW: 14.9 % (ref 11.5–15.5)

## 2010-06-30 LAB — CBC
HCT: 33.2 % — ABNORMAL LOW (ref 36.0–46.0)
Hemoglobin: 10.7 g/dL — ABNORMAL LOW (ref 12.0–15.0)
MCH: 30 pg (ref 26.0–34.0)
MCHC: 32.2 g/dL (ref 30.0–36.0)
MCV: 93 fL (ref 78.0–100.0)
Platelets: 84 10*3/uL — ABNORMAL LOW (ref 150–400)
RBC: 3.57 MIL/uL — ABNORMAL LOW (ref 3.87–5.11)
RDW: 14.8 % (ref 11.5–15.5)
WBC: 11.9 10*3/uL — ABNORMAL HIGH (ref 4.0–10.5)

## 2010-06-30 LAB — BASIC METABOLIC PANEL
BUN: 6 mg/dL (ref 6–23)
CO2: 25 mEq/L (ref 19–32)
Calcium: 7.8 mg/dL — ABNORMAL LOW (ref 8.4–10.5)
Chloride: 112 mEq/L (ref 96–112)
Creatinine, Ser: 0.71 mg/dL (ref 0.4–1.2)
GFR calc Af Amer: >60 mL/min (ref >60)
GFR calc non Af Amer: >60 mL/min (ref >60)
Glucose, Bld: 134 mg/dL — ABNORMAL HIGH (ref 70–99)
Potassium: 3.8 mEq/L (ref 3.5–5.1)
Sodium: 143 mEq/L (ref 135–145)

## 2010-06-30 LAB — MAGNESIUM: Magnesium: 1.9 mg/dL (ref 1.5–2.5)

## 2010-07-01 LAB — CBC
MCV: 91.3 fL (ref 78.0–100.0)
Platelets: 84 10*3/uL — ABNORMAL LOW (ref 150–400)
RBC: 3.32 MIL/uL — ABNORMAL LOW (ref 3.87–5.11)
RDW: 14.7 % (ref 11.5–15.5)
WBC: 9 10*3/uL (ref 4.0–10.5)

## 2010-07-01 LAB — POCT I-STAT 7, (LYTES, BLD GAS, ICA,H+H)
Acid-base deficit: 1 mmol/L (ref 0.0–2.0)
Bicarbonate: 23.3 mEq/L (ref 20.0–24.0)
Calcium, Ion: 1.11 mmol/L — ABNORMAL LOW (ref 1.12–1.32)
HCT: 32 % — ABNORMAL LOW (ref 36.0–46.0)
Hemoglobin: 10.9 g/dL — ABNORMAL LOW (ref 12.0–15.0)
Patient temperature: 35.4
pCO2 arterial: 35 mmHg (ref 35.0–45.0)
pO2, Arterial: 296 mmHg — ABNORMAL HIGH (ref 80.0–100.0)

## 2010-07-02 LAB — PROTIME-INR: Prothrombin Time: 16.2 seconds — ABNORMAL HIGH (ref 11.6–15.2)

## 2010-07-14 NOTE — Op Note (Signed)
NAME:  Katelyn [name], Katelyn [name]                ACCOUNT NO.:  [id number]  MEDICAL RECORD NO.:  [id number]           PATIENT TYPE:  I  LOCATION:  2303                         FACILITY:  [facility]  PHYSICIAN:  Juleen China IV, MDDATE OF BIRTH:  [birth year]  DATE OF PROCEDURE:  06/27/2010 DATE OF DISCHARGE:                              OPERATIVE REPORT   PREOPERATIVE DIAGNOSIS:  Bilateral claudication.  POSTOPERATIVE DIAGNOSIS:  Bilateral claudication.  PROCEDURE PERFORMED:  Aortobifemoral bypass graft with bilateral profundoplasties using a 16 x 8 bifurcated Dacron graft.  SURGEON: 1. Katelyn Cross, MD  ASSISTANT:  Katelyn Goo, PA-C  ANESTHESIA:  General.  BLOOD LOSS:  400 mL.  COMPLICATIONS:  None.  FINDINGS:  Initially, a suprarenal clamp was placed for 6 minutes in order to evacuate all of the thrombus from the infrarenal aorta and end- to-end proximal anastomosis was performed, reinforced with a felt strip. Bilateral femoral anastomoses were done with profundoplasties extending several millimeters onto the profunda femoral bilaterally.  There were two main right profunda branches and one left.  The profundoplasty on the right was done to the lateral branch.  INDICATIONS:  [name] is a very pleasant 70 year old female who has been evaluated for claudication.  She was found to have occlusion of her infrarenal abdominal aorta extending up to the renal arteries.  She is having severe claudication and occasional rest pain.  Surgical options were discussed with the patient as well as medical management and the decision has been made to proceed with aortobifemoral bypass graft.  The risks and benefits were discussed extensively with the patient and detailed in the clinic note.  PROCEDURE IN DETAIL:  The patient was identified in the holding area and taken to room #9.  The appropriate anesthesia lines and monitoring devices were placed.  General endotracheal anesthesia was  administered. The patient was prepped and draped in usual fashion.  A time-out was called.  Antibiotics were given.  Initially, began in the groins.  The inguinal ligament was identified by bony landmarks.  Longitudinal incisions were made in each groin.  The femoral pulse was not palpable, however, the capsular femoral artery was easily discerned.  Cautery was used to divide subcutaneous tissue down to the femoral sheath.  The femoral sheath was opened sharply on both sides.  Sharp dissection was used to dissect out the common femoral artery.  I dissected out 1-2 cm distally on the profunda and the superficial femoral artery bilaterally. I then dissected proximally under both inguinal ligaments crossing the iliac veins were divided with 2-0 silk ties.  Once I was satisfied with groin exposure, the groins were packed with wet gauze and attention was turned towards the abdomen.  A midline abdominal incision was made from the xiphoid to below the umbilicus.  Cautery was used to the subcutaneous tissue.  The midline abdominal fascia was identified and then opened with cautery.  Sharp dissection was then used to enter the peritoneal cavity, and abdomen was exposed throughout the length of the incision.  The omentum was adhesed into the pelvis and was lysis of the omental adhesions was performed with  cautery.  Once the omentum was mobilized, the abdomen was grossly inspected.  There was no gross pathology.  Her liver appeared somewhat pale on the periphery, but of normal color centrally the large small bowel without abnormality.  The appendix was normal.  Next, a Katelyn [name] was used to open the abdomen. Omni tract retractor was placed.  The omentum was reflected cephalad. The ligament of Treitz was then taken down with a combination of sharp dissection and Bovie cautery.  The small bowel was mobilized to the patient's right and packed off in a moist towel.  Next, the abdominal aorta was exposed.   The inferior mesenteric vein was divided between 2-0 silk ties.  I initially dissected out the aorta down to its bifurcation. A tunnel was then created on each side into the groin passing directly anterior to the iliac arteries, posterior to the ureter bilaterally. Umbilical tapes were then passed.  Dissection was then proceeded cephalad.  Left renal vein was identified, however, by CT scan.  The patient did have a renal vein collar and in the mid crossing posterior to the aorta in its midportion, I identified this large renal vein and stayed away from it.  I dissected out the infrarenal abdominal aorta and passed an umbilical tape around the infrarenal aorta.  I then dissected out both renal arteries and encircled them with vessel loops.  The suprarenal abdominal aorta was then dissected out on each side.  Once I was satisfied with the exposure, the patient was given systemic heparinization as well as 25 g of mannitol.  After this had circulated, the suprarenal abdominal aorta was occluded with a aortic DeBakey clamp. There is mild increase in her systolic pressure.  She tolerated this well and then made an arteriotomy on the anterior side of the abdominal aorta approximately 5 cm below the renal.  I transected the aorta at this level.  The aorta was occluded.  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  Using a hemostat and an elevator, I evacuated all the thrombus within the infrarenal abdominal aorta.  Both renal arteries were flushed making sure there was no thrombus.  There was adequate backbleeding from both renal arteries.  Next, I used a Harken clamp and placed it on the infrarenal abdominal aorta and the suprarenal clamp was removed.  I did flush the aorta once at this time.  Total suprarenal clamp time was 6 minutes.  Next, I oversewed the distal end of the abdominal aorta with two layers of 4-0 Prolene.  Next, a bifurcated 16 x 8 Dacron graft was brought onto the field and end-to-end  anastomosis was performed between the Dacron graft in the infrarenal abdominal aorta.  Three mattress posterior wall sutures were placed incorporating a felt strip.  The lateral two 3-0 sutures were used to complete the running anastomosis incorporating the felt strip.  Once the anastomosis was completed, the graft was flushed and the infrarenal clamp was removed.  The proximal anastomosis was hemostatic.  This area was packed off and both limbs of the graft were brought into each groin through the previously created tunnel making sure that the graft maintain its orientation.  The right groin anastomosis was performed first.  A baby Gregory clamp was used to occlude the profunda femoral artery.  Fistula clamp occluded the superficial femoral artery.  The proximal femoral artery was occluded with a Henley clamp.  A #11 blade was used to make an arteriotomy in a soft spot of the femoral artery.  There was  very calcified posterior plaque.  Potts scissors were used to open the femoral artery down approximately 2 mm onto the profunda femoral artery.  The right limb of the graft was cut to the appropriate length and then spatulated to fit the size of the arteriotomy.  Running anastomosis was created with 6-0 Prolene.  Prior to completion of the anastomosis, appropriate flush maneuvers were performed and the anastomosis was complete.  The right limb of the graft was opened and blood flow was reestablished to the right leg.  Next, attention was turned towards the left groin.  Baby Gregory clamp was used to occlude the profunda and superficial femoral artery.  Henley clamp occluded the common femoral artery.  The plaque on the femoral artery at this level necessitated making the arteriotomy on the lateral side.  This was done with a #11 blade.  It was extended with Potts scissors down onto the profunda femoral artery approximately 2 mm. The left limb of the graft was cut to the appropriate length  and then spatulated to fit the size of the arteriotomy.  A running anastomosis was then created with 6-0 Prolene prior to completion, appropriate flush maneuvers were performed.  There was good backbleeding from the profunda and superficial femoral artery.  The anastomosis was then completed and blood flow was reestablished in the left leg.  Handheld Doppler was used to evaluate the signals in the superficial femoral and profunda femoral artery.  They had multiphasic signals, which became monophasic with graft occlusion.  At this point, I was satisfied with repair.  The patient's heparin was reversed with 50 mg of protamine.  The abdomen was then irrigated and hemostasis was achieved.  The retroperitoneum was then reapproximated using a running 2-0 Vicryl.  FloSeal was placed up around the proximal anastomosis.  The Omni tract clamps were removed. The small bowel was placed back into anatomic position.  The small bowel was carefully inspected to make sure there were no abnormalities. Transverse colon and omentum were placed back into their anatomic position.  I verified position of the NG tube.  The fascia was then reapproximated with two running #1 PDS suture.  The subcutaneous tissue was closed with running 2-0 Vicryl, and the skin was closed with running 4-0 Vicryl.  Attention was then turned towards the groins.  They were copiously irrigated.  The femoral sheath was reapproximated with 2-0 Vicryl.  Subcutaneous tissue was closed in two additional layers of 2-0 and 3-0 Vicryl.  The skin was closed with 4-0 Vicryl.  Dermabond was placed in the wound.  The patient tolerated the procedure well.  There were no complications.     [name], MD     VWB/MEDQ  D:  06/28/2010  T:  06/29/2010  Job:  [id number]  Electronically Signed by Arelia Longest IV MD on 07/14/2010 10:59:08 PM

## 2010-07-18 NOTE — Op Note (Signed)
NAME:  Katelyn [name], Katelyn [name]                ACCOUNT NO.:  [id number]   MEDICAL RECORD NO.:  [id number]          PATIENT TYPE:  AMB   LOCATION:  DAY                           FACILITY:  [facility]   PHYSICIAN:  Lionel December, M.D.    DATE OF BIRTH:  1940/04/29   DATE OF PROCEDURE:  06/01/2006  DATE OF DISCHARGE:                               OPERATIVE REPORT   PROCEDURE:  Colonoscopy.   INDICATIONS:  Katelyn [name] is a 70 year old Caucasian female who is undergoing  average risk screening colonoscopy.  Procedure risks were reviewed with  the patient, and informed consent was obtained.   MEDICATIONS FOR CONSCIOUS SEDATION:  Demerol 25 mg IV, Versed 5 mg IV.   FINDINGS:  Procedure performed in endoscopy suite.  The patient's vital  signs and O2 saturations were monitored during the procedure and  remained stable.  The patient was placed in the left lateral recumbent  position and rectal examination performed.  No abnormality noted on  external or digital exam.  The Pentax videoscope was placed in the  rectum and advanced under direct vision into the sigmoid colon and  beyond.  The preparation was excellent, except she had stool coating the  mucosa of her right colon.  The scope was passed into the cecum which  was identified by the ileocecal valve and appendiceal orifice.  Landmarks were obvious after vigorous washing of the mucosa.  As the  scope was withdrawn, the colonic mucosa was carefully examined and was  normal throughout.  She had very prominent haustral markings, and the  areas behind these were carefully evaluated and no abnormality was  noted.  The rectal mucosa similarly was normal.  The scope was  retroflexed to examine the anorectal junction, and small hemorrhoids  were noted below the dentate line.  The endoscope was straightened and  withdrawn.  The patient tolerated the procedure well.   FINAL DIAGNOSIS:  Normal colonoscopy, except small external hemorrhoids.   RECOMMENDATIONS:  1.  She will resume Coumadin at usual dose.  2. Yearly Hemoccults.  3. The patient advised not to take meloxicam on a daily basis since      she is on Coumadin.  She may want to review her options with Dr.      Renard Matter regarding her NSAID use in the setting of anticoagulation.      Lionel December, M.D.  Electronically Signed     NR/MEDQ  D:  06/01/2006  T:  06/01/2006  Job:  [id number]   cc:   Angus G. Renard Matter, MD  Fax: [phone]

## 2010-07-18 NOTE — Procedures (Signed)
[facility]  Patient:    Katelyn [name], Katelyn [name] Visit Number: [id number] MRN: [id number]          Service Type: OUT Location: RAD Attending Physician:  Alice Reichert Dictated by:   Kari Baars, M.D. Admit Date:  03/09/2001   CC:         Butch Penny, M.D.   Stress Test  PRIMARY CARE PHYSICIAN:  Dr. Butch Penny.  INDICATION FOR PROCEDURE:  [name] has had chest pain and is undergoing graded exercise testing to rule out ischemic cardiac disease.  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  There are no contraindications to graded exercise testing.  DESCRIPTION OF PROCEDURE:  [name] exercised for four minutes on the Bruce protocol, reaching and sustaining 7.0 METs.  Her maximum recorded heart rate was 138, which is 86% of her age-predicted maximal heart rate.  Her blood pressure response to exercise was somewhat exaggerated.  She became short of breath and had "a tightness" in the back of her legs with exercise but had no chest pain.  There were no electrocardiographic changes suggestive of inducible ischemia.  IMPRESSION: 1. Poor exercise tolerance. 2. Shortness of breath and leg tightness with exercise but no chest pain. 3. No evidence of inducible ischemia. 4. Somewhat exaggerated blood pressure response to exercise. Dictated by:   Kari Baars, M.D. Attending Physician:  Alice Reichert DD:  03/10/01 TD:  03/10/01 Job: [id number]

## 2010-07-18 NOTE — Procedures (Signed)
NAME:  Katelyn [name], Katelyn [name]                          ACCOUNT NO.:  [id number]   MEDICAL RECORD NO.:  [id number]                   PATIENT TYPE:  OUT   LOCATION:  RAD                                  FACILITY:  [facility]   PHYSICIAN:  Enid Bing, M.D.               DATE OF BIRTH:  1940-08-29   DATE OF PROCEDURE:  08/01/2003  DATE OF DISCHARGE:                                  ECHOCARDIOGRAM   REFERRING PHYSICIAN:  Angus G. Renard Matter, M.D. and Willa Rough, M.D.   CLINICAL DATA:  A 70 year old woman with TIA and aortic valve disease.   M-MODE MEASUREMENTS:  Aorta 2.8, left atrium 3.7, septum 1.5, posterior wall  1.3, LV diastole 3.8, LV systole 2.5.   1. Technically adequate echocardiographic study.  2. Normal left and right atrial size; normal right ventricular size and     function; borderline RVH.  3. Marked thickening with some calcification of the aortic valve leaflets;     very mild stenosis and mild to moderate insufficiency present by Doppler     criteria.  4. Mild to moderate mitral valve thickening;  normal function.  5. Normal tricuspid valve; trivial tricuspid and mitral regurgitation.  6. Normal pulmonic valve.  7. Normal proximal pulmonary artery.  8. Normal internal dimension of the left ventricle; mild to moderate     hypertrophy with distal portion and involvement of the proximal septum.     Normal regional and global LV systolic function.  9. Mild dilatation of the IVC; resperophasic response is blunted.      ___________________________________________                                            Sasser Bing, M.D.   RR/MEDQ  D:  08/01/2003  T:  08/02/2003  Job:  [id number]   cc:   Angus G. Renard Matter, M.D.  [address]  [place]  [place] [address]  Fax: [phone]   Willa Rough, M.D.

## 2010-07-18 NOTE — Procedures (Signed)
NAME:  Katelyn [name], Katelyn [name]                ACCOUNT NO.:  [id number]   MEDICAL RECORD NO.:  [id number]          PATIENT TYPE:  OUT   LOCATION:  RAD                           FACILITY:  [facility]   PHYSICIAN:  Pricilla Riffle, MD, FACCDATE OF BIRTH:  June 16, 1940   DATE OF PROCEDURE:  03/22/2006  DATE OF DISCHARGE:                                ECHOCARDIOGRAM   REFERRING PHYSICIAN:  Angus G. Renard Matter, MD.   INDICATIONS:  A 70 year old with aortic insufficiency. Recommend follow-  up.   A 2-D echo with echo Doppler, left ventricle is normal in size with an  end-diastolic dimension of 39 mm.  The interventricular septum and  posterior wall are thickened, measuring at 16 and 12 mm each.  Left  atrium is normal.  Right atrium, right ventricle are normal.  Aortic  root is normal at 30 mm.   Aortic valve is thickened, calcified with restricted motion.  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  There is mild insufficiency.   The mitral valve is thickened with mild annular calcification.  There is  no insufficiency.  Pulmonic valve is not well seen.  Tricuspid valve is  normal with trace insufficiency.   Overall LV systolic function is normal with an LVF of approximately 60-  65%.  RVF is normal.   IVC is normal.   No pericardial effusion is seen.   From report of August 01, 2003, gradients across the aortic valve are not  mentioned, but there does not appear to be significant change in the  aortic insufficiency.      Pricilla Riffle, MD, [facility]  Electronically Signed     PVR/MEDQ  D:  03/22/2006  T:  03/22/2006  Job:  [id number]

## 2010-07-18 NOTE — Consult Note (Signed)
NAME:  Katelyn [name], Katelyn [name]                          ACCOUNT NO.:  [id number]   MEDICAL RECORD NO.:  [id number]                   PATIENT TYPE:  REC   LOCATION:  FOOT                                 FACILITY:  [facility]   PHYSICIAN:  Jonelle Sports. Sevier, M.D.              DATE OF BIRTH:  Mar 05, 1940   DATE OF CONSULTATION:  01/29/2003  DATE OF DISCHARGE:                                   CONSULTATION   HISTORY:  This 70 year old white female was referred by Dr. Margo Aye and Dr.  Donia Ast for assistance with evaluation of a nonhealing lesion on the lateral  aspect of the left leg.   The patient is not a diabetic and has had some problems with chronic venous  insufficiency, but this has not been severe and she has had no prior  ulcerations. She does use support hose.   Approximately one month ago the patient noticed what was apparently a spider  bite on the lateral aspect of the distal left lower extremity. This became  somewhat necrotic and she saw Dr. Margo Aye and eventually Dr. Donia Ast for this.  They both felt that this was likely a necrotic insect bite as opposed to  simply a nonspecific venous ulceration. She had been treated with the  application of Bactroban and dry dressing and has continued to wear a  compression stocking. Despite this the lesion has enlarged over the past  several days and she is here now for our evaluation and advice.   PAST MEDICAL HISTORY:  Notable for carotid stenosis with a history of TIAs,  chronic bronchitis, chronic venous insufficiency, and hyperlipidemia.   ALLERGIES:  She is said to be allergic to tetanus and antidepressive  medications as well as several foods.   CURRENT MEDICATIONS:  1. Coumadin by schedule.  2. Lipitor.  3. Bextra.  4. Estratest.  5. Bactroban as previously indicated.  6. She has during the treatment of this lesion had courses of oral Augmentin     and Levaquin.   PHYSICAL EXAMINATION:  Examination today is limited to just the lower  extremities. The right lower extremity is largely unremarkable and will not  be further commented upon. The left lower extremity has normal temperatures,  which are incidentally equivalent to the  contralateral side. No significant  edema or evidence of superficial varicosities and chronic venous  insufficiency. Adequately palpable pulses and preservation of protected  sensation. On the lateral supramalleolar aspect of that leg is an opened  ulceration measuring 20x30 mm and some 3 mm in depth with clean margins,  very little surrounding erythema, but considerably fibrinous base.   IMPRESSION:  Probable ulcerated wound secondary to previous insect bite.   DISPOSITION:  1. It is discussed with the patient that the first order of business is to     get this wound debrided which hopefully, in view of her Coumadin therapy,     can be done  chemically with ointments. Once that is done, it is     anticipated that perhaps the use of Oasis or some other nitrous material     will assist with the wounds healing, although it is mentioned to the     patient that she may ultimately require a graft to this area.  2. The wound does not appear debrided, but was dressed with Accuzyme     ointment and this is covered with     Adaptic and an absorptive dressing. The extremity to the knee is placed     in a Kerlix Coban wrap.  3. The patient will return for change of this dressing in four days and     again in seven days, 11 days, and will be seen by this physician in 16     days.                                               Jonelle Sports. Cheryll Cockayne, M.D.    RES/MEDQ  D:  01/29/2003  T:  01/29/2003  Job:  [id number]   cc:   Mertha Finders., M.D.  [id number] [place]  [place] [address]  Fax: [phone]   Consuello Closs, M.D.  [place]  [place]  [place]  [place] [address]  Fax: [phone]

## 2010-07-21 ENCOUNTER — Encounter (INDEPENDENT_AMBULATORY_CARE_PROVIDER_SITE_OTHER): Payer: Medicare Other

## 2010-07-21 ENCOUNTER — Ambulatory Visit (INDEPENDENT_AMBULATORY_CARE_PROVIDER_SITE_OTHER): Payer: Medicare Other | Admitting: Surgery

## 2010-07-21 DIAGNOSIS — I7092 Chronic total occlusion of artery of the extremities: Secondary | ICD-10-CM

## 2010-07-21 DIAGNOSIS — I739 Peripheral vascular disease, unspecified: Secondary | ICD-10-CM

## 2010-07-21 DIAGNOSIS — R1900 Intra-abdominal and pelvic swelling, mass and lump, unspecified site: Secondary | ICD-10-CM

## 2010-07-21 DIAGNOSIS — Z48812 Encounter for surgical aftercare following surgery on the circulatory system: Secondary | ICD-10-CM

## 2010-07-22 NOTE — Assessment & Plan Note (Signed)
OFFICE VISIT  [name], [name] DOB:  1941/01/31                                       07/21/2010 [id number]#:[id number]  HISTORY:  This is a 70 year old female with bilateral claudication and known occluded infrarenal abdominal aorta.  She underwent aortobifemoral bypass graft on 06/27/2010.  Her postoperative course was uncomplicated. She was discharged home on 05/02.  She has noticed dramatic improvement in her ability to walk.  Her biggest complaint is that of poor appetite and that she does not have taste for any foods.  She also complains of some swelling in her left groin about a week ago.  PHYSICAL EXAMINATION:  She has palpable pedal pulses bilaterally.  Her incisions are well-healed.  There is a fullness to the left groin.  Overall I think she is doing very well.  I obtained an ultrasound of the left groin to make sure that this was not a pseudoaneurysm.  This was a fluid collection most likely consistent with seroma.  I reiterated to the patient that we should observe this for now and hopefully it will resolve spontaneously.  I have encouraged her to take nutritional supplements and to try to eat as much as she can so that she does not lose strength.  I will plan on seeing her back in 1 month for followup.    [name], MD Electronically Signed  VWB/MEDQ  D:  07/21/2010  T:  07/22/2010  Job:  [id number]  cc:   Landry Corporal, MD

## 2010-07-22 NOTE — Discharge Summary (Signed)
  NAME:  Katelyn [name], Katelyn [name]                ACCOUNT NO.:  [id number]  MEDICAL RECORD NO.:  [id number]           PATIENT TYPE:  I  LOCATION:  2022                         FACILITY:  [facility]  PHYSICIAN:  Juleen China IV, MDDATE OF BIRTH:  1940-11-18  DATE OF ADMISSION:  06/27/2010 DATE OF DISCHARGE:  07/02/2010                              DISCHARGE SUMMARY   ADMISSION DIAGNOSIS:  Bilateral claudication.  HISTORY OF PRESENT ILLNESS:  This is a 70 year old female who has been evaluated for claudication.  She was found to have occlusion of her infrarenal abdominal aorta extending up to the renal arteries.  She is having severe claudication and occasional rest pain.  Surgical options were discussed with the patient as well as medical management and a decision was made to proceed with an aortobifemoral bypass graft.  The risk and benefits were discussed extensively with the patient in detail in the clinic note.  HOSPITAL COURSE:  The patient was admitted to the hospital and taken to the operating room on June 27, 2010, where she underwent an aortobifemoral bypass graft with bilateral profundoplasties using a 16 x 8 bifurcated Dacron graft.  By postoperative day #1, she had positive palpable posterior tibial and dorsalis pedal pulses.  She was transported to the telemetry floor on that day.  Of postoperative day #2, she continued to have positive palpable pulses.  Her Foley and NG tube were discontinued and she was started on ice chips p.r.n.  She did have some thrombocytopenia postoperatively.  She was restarted on her Coumadin and her diet was advanced without difficulty.  Otherwise, her postoperative course included increasing ambulation.  DISCHARGE DIAGNOSES: 1. Bilateral claudication.     a.     Status post aortobifemoral bypass graft on June 27, 2010. 2. Moderate-to-severe aortic stenosis. 3. Negative nuclear Myoview. 4. Hypertension. 5. Chronic tobacco use.  DISCHARGE  MEDICATIONS: 1. Oxycodone 5 mg one to two p.o. q. 6 h. p.r.n. pain. 2. Amlodipine 10 mg p.o. daily. 3. Cilostazol 100 mg p.o. b.i.d. 4. Crestor 20 mg p.o. daily. 5. Folic tab one p.o. daily. 6. Meloxicam 15 mg p.o. daily. 7. Plavix 75 mg p.o. daily. 8. Premarin 1.25 mg p.o. daily. 9. Verapamil 120 mg p.o. daily. 10.Warfarin 3 mg p.o. daily. 11.Zyrtec 10 mg p.o. daily.  FOLLOWUP:  The patient is to follow up with Dr. Myra Gianotti in 6 weeks.    ______________________________ Newton Pigg, PA   ______________________________ V. Charlena Cross, MD    SE/MEDQ  D:  07/16/2010  T:  07/17/2010  Job:  [id number]  cc:   Angus G. Renard Matter, MD Nicki Guadalajara, M.D. [place] Hilty, MD  Electronically Signed by Newton Pigg PA on 07/17/2010 27:25:36 PM Electronically Signed by Arelia Longest IV MD on 07/22/2010 10:10:44 PM

## 2010-08-25 ENCOUNTER — Ambulatory Visit (INDEPENDENT_AMBULATORY_CARE_PROVIDER_SITE_OTHER): Payer: Medicare Other | Admitting: Surgery

## 2010-08-25 DIAGNOSIS — I70219 Atherosclerosis of native arteries of extremities with intermittent claudication, unspecified extremity: Secondary | ICD-10-CM

## 2010-08-26 NOTE — Assessment & Plan Note (Signed)
OFFICE VISIT  [name], [name] DOB:  05-Aug-1940                                       08/25/2010 [id number]#:[id number]  The patient is status post aortobifemoral bypass graft on 06/27/2010.  I last saw her in May.  She had a left groin seroma and she was having trouble with her appetite.  She was doing well from an ambulatory perspective.  She had an ultrasound of the left groin that showed no evidence of pseudoaneurysm.  This was most likely a seroma.  She comes back today for follow-up.  Her appetite is much improved.  She continues to ambulate very well.  She does complain of bilateral leg swelling.  PHYSICAL EXAMINATION:  The seroma in the left groin is unchanged.  It is somewhat tender.  She has palpable pulses.  Her midline incision is healed.  No hernia.  She does have 1-2+ pitting edema bilaterally.  I think this would best be treated with compression stockings.  I am giving her a prescription for thigh-high 20-30 mm compression.  I am going to see her back in 6 weeks for follow-up visit.    [name], MD Electronically Signed  VWB/MEDQ  D:  08/25/2010  T:  08/26/2010  Job:  [id number]  cc:   Landry Corporal, MD

## 2010-09-04 NOTE — Procedures (Unsigned)
VASCULAR LAB EXAM  INDICATION:  Status post aortobifem graft placed on 06/27/2010, now with palpable mass in left groin.  HISTORY: Diabetes:  No. Cardiac: Hypertension:  Yes.  EXAM:  Left groin duplex.  IMPRESSION: 1. No evidence of deep venous thrombosis. 2. No evidence of fistula. 3. No evidence of active pseudoaneurysm. 4. Fluid-filled mass without vascularization, as observed in the left     groin, measuring approximately 4.2 cm x 12.3 cm.  ___________________________________________ V. Charlena Cross, MD  LT/MEDQ  D:  07/21/2010  T:  07/21/2010  Job:  [id number]

## 2010-09-19 ENCOUNTER — Encounter: Payer: Self-pay | Admitting: Surgery

## 2010-10-13 ENCOUNTER — Ambulatory Visit (INDEPENDENT_AMBULATORY_CARE_PROVIDER_SITE_OTHER): Payer: Medicare Other | Admitting: Surgery

## 2010-10-13 ENCOUNTER — Encounter: Payer: Self-pay | Admitting: Surgery

## 2010-10-13 VITALS — BP 158/70 | HR 77 | Temp 98.2°F | Ht 70.0 in | Wt 170.0 lb

## 2010-10-13 DIAGNOSIS — Z95828 Presence of other vascular implants and grafts: Secondary | ICD-10-CM

## 2010-10-13 DIAGNOSIS — I70219 Atherosclerosis of native arteries of extremities with intermittent claudication, unspecified extremity: Secondary | ICD-10-CM

## 2010-10-13 DIAGNOSIS — Z9889 Other specified postprocedural states: Secondary | ICD-10-CM

## 2010-10-13 NOTE — Progress Notes (Signed)
Subjective:     Patient ID: Katelyn [name], female   DOB: 09/13/1940, 70 y.o.   MRN: [id number]  HPI Katelyn [name] returns today for followup. She is status post aortobifemoral bypass on 06/27/2010 using a 16 x 8 bifurcated dacryon graft a supra-renal clamp was required initially to evacuate the thrombus below the renal arteries. The clamp above the renals was on for approximately 6 minutes. The patient's postoperative course was uncomplicated and she was discharged to home on the fifth postoperative day. I saw her back in the office several weeks later. She did have a left groin seroma as well as problems with her appetite. The left groin was ultrasounded there was no evidence of pseudoaneurysm. She was complaining of bilateral edema and her claudication symptoms were resolved. I recommended putting her in compression stockings. She is not gotten that prescription filled. She complains of edema from her abdomen down the right side slightly worse than the left.  Review of Systems  Constitutional: Negative for fever and chills.  Cardiovascular: Positive for leg swelling. Negative for chest pain.  All other systems reviewed and are negative.       Objective:   Physical Exam  Constitutional: She appears well-developed and well-nourished.  HENT:  Head: Normocephalic and atraumatic.  Cardiovascular: Normal rate and regular rhythm.        Palpable dorsalis pedis pulse bilaterally  Abdominal: Soft.       Incision well healed with no evidence of hernia there is a slight prominence in the left groin consistent with known seroma no evidence of infection or drainage  Musculoskeletal: Normal range of motion. She exhibits edema.  Skin: Skin is warm and dry.       Assessment:    status post aortobifemoral bypass graft    Plan:     From a surgical perspective the patient is recovering very nicely. Her symptoms of claudication have resolved. She has palpable pedal pulses. Her biggest complaint has been  bilateral edema which does extend up to her waist area although postoperative edema and is relatively, and with the groin incisions her edema appears to be a little bit more pronounced than usual I would like to order a CT scan of her abdomen and pelvis with venous phase to make sure that there is not an obstructive component and her venous outflow tract which is contributing to her edema. In addition with her known aortic stenosis and coronary disease I am going to place her on Lasix to make sure this is just not a fluid accumulation giving her 20 mg of Lasix daily and 10 mEq of potassium supplementation. I plan on seeing the patient back in 2 weeks for evaluation I am also recommending thigh-high compression stockings which she is going to get measured for the next 2 weeks in refill.

## 2010-10-14 ENCOUNTER — Other Ambulatory Visit: Payer: Self-pay | Admitting: Surgery

## 2010-10-14 DIAGNOSIS — I739 Peripheral vascular disease, unspecified: Secondary | ICD-10-CM

## 2010-10-27 ENCOUNTER — Ambulatory Visit
Admission: RE | Admit: 2010-10-27 | Discharge: 2010-10-27 | Disposition: A | Discharge status: Home / Self Care | Payer: Medicare Other | Source: Ambulatory Visit | Attending: Surgery | Admitting: Surgery

## 2010-10-27 ENCOUNTER — Ambulatory Visit (INDEPENDENT_AMBULATORY_CARE_PROVIDER_SITE_OTHER): Payer: Medicare Other | Admitting: Surgery

## 2010-10-27 ENCOUNTER — Encounter: Payer: Self-pay | Admitting: Surgery

## 2010-10-27 VITALS — BP 128/71 | HR 82 | Resp 18 | Ht 70.0 in | Wt 167.0 lb

## 2010-10-27 DIAGNOSIS — I739 Peripheral vascular disease, unspecified: Secondary | ICD-10-CM

## 2010-10-27 DIAGNOSIS — I70219 Atherosclerosis of native arteries of extremities with intermittent claudication, unspecified extremity: Secondary | ICD-10-CM

## 2010-10-27 MED ORDER — IOHEXOL 300 MG/ML  SOLN
125.0000 mL | Freq: Once | INTRAMUSCULAR | Status: AC | PRN
  Administered 2010-10-27: 125 mL via INTRAVENOUS

## 2010-10-27 NOTE — Progress Notes (Signed)
Subjective:     Patient ID: Katelyn [name], female   DOB: [birth year], 70 y.o.   MRN: [id number]  HPI This is a 70 year old female who underwent aortobifemoral bypass graft on 06/27/2010 for an occluded infrarenal abdominal aorta. She did require a 6 minute suprarenal cross-clamp time. She has known aortic stenosis. I last saw her in early August where she had complaints of bilateral edema. I felt this was more pronounced than normal. I gave her Lasix in addition to getting her fitted for compression stockings. I also ordered a CT angiogram of the venous phase to rule out an obstructive process. She is back today for followup. She is no better than when I saw her several weeks ago. She has been wearing her stockings. She has noticed an area of slight skin breakdown on the lateral aspect of the right leg. She also had one episode of numbness in her foot last night.  Review of Systems No chest pain no shortness of breath    Past Medical History  Diagnosis Date  . Hypertension   . Stroke 1982  . Claudication   . Heart murmur   . Muscle pain   . Seroma, postoperative     Left Groin    History  Substance Use Topics  . Smoking status: Former Smoker -- 0.1 packs/day    Types: Cigarettes    Quit date: 06/01/2010  . Smokeless tobacco: Not on file  . Alcohol Use: No    Family History  Problem Relation Age of Onset  . Heart disease Mother   . Stroke Father   . Hypertension Father   . Arthritis Brother     Allergies  Allergen Reactions  . Adhesive (Tape) Rash  . Latex Rash    Current outpatient prescriptions:amLODipine (NORVASC) 10 MG tablet, Take 10 mg by mouth daily.  , Disp: , Rfl: ;  cetirizine (ZYRTEC) 10 MG tablet, Take 10 mg by mouth daily.  , Disp: , Rfl: ;  estrogens, conjugated, (PREMARIN) 1.25 MG tablet, Take 1.25 mg by mouth daily.  , Disp: , Rfl: ;  FOLIC ACID PO, Take by mouth daily.  , Disp: , Rfl: ;  meloxicam (MOBIC) 15 MG tablet, Take 15 mg by mouth daily.  , Disp: ,  Rfl:  rosuvastatin (CRESTOR) 20 MG tablet, Take 20 mg by mouth daily.  , Disp: , Rfl: ;  verapamil (CALAN) 120 MG tablet, Take 120 mg by mouth daily.  , Disp: , Rfl: ;  warfarin (COUMADIN) 3 MG tablet, Take 3 mg by mouth as directed.  , Disp: , Rfl:  No current facility-administered medications for this visit. Facility-Administered Medications Ordered in Other Visits: iohexol (OMNIPAQUE) 300 MG/ML injection 125 mL, 125 mL, Intravenous, Once PRN, Medication Radiologist, 125 mL at 10/27/10 1335  BP 128/71  Pulse 82  Resp 18  Ht 5\' 10"  (1.778 m)  Wt 167 lb (75.751 kg)  BMI 23.96 kg/m2  Body mass index is 23.96 kg/(m^2).       Objective:   Physical Exam  Constitutional: She is oriented to person, place, and time. She appears well-developed.  Cardiovascular: Normal rate and regular rhythm.        Palpable pedal pulses bilaterally  Pulmonary/Chest: Effort normal.  Abdominal: Soft.       Well-healed incision, no hernia  Neurological: She is alert and oriented to person, place, and time.  Skin:       Slight skin breakdown contralateral side of the right leg with some brawny  appearance to her skin color   CT scan normal postoperative findings. Left groin seroma patent inferior vena cava and pelvic iliac veins without compression    Assessment:     Status post aortobifemoral bypass graft with postoperative edema    Plan:     The patient had minimal response to Lasix. She also had minimal response to 20-30 mm compression stockings, thigh-high. Her CT scan does not show any evidence of external compression on her iliac veins. My suspicion at this point is that the patient is suffering from lymphedema following her groin dissections. She could potentially have venous reflux but out of the surprised if he presented in this manner. I'm going to send her back to see Dr. Herbie Baltimore for reevaluation of her aortic stenosis. I will plan on seeing her back in 3 months

## 2010-11-13 ENCOUNTER — Other Ambulatory Visit: Payer: Self-pay | Admitting: Surgery

## 2010-12-09 ENCOUNTER — Other Ambulatory Visit (HOSPITAL_COMMUNITY): Payer: Self-pay | Admitting: Family Medicine

## 2010-12-09 DIAGNOSIS — Z139 Encounter for screening, unspecified: Secondary | ICD-10-CM

## 2010-12-12 ENCOUNTER — Ambulatory Visit (HOSPITAL_COMMUNITY)
Admission: RE | Admit: 2010-12-12 | Discharge: 2010-12-12 | Disposition: A | Discharge status: Home / Self Care | Payer: Medicare Other | Source: Ambulatory Visit | Attending: Family Medicine | Admitting: Family Medicine

## 2010-12-12 DIAGNOSIS — Z1231 Encounter for screening mammogram for malignant neoplasm of breast: Secondary | ICD-10-CM | POA: Insufficient documentation

## 2010-12-12 DIAGNOSIS — Z139 Encounter for screening, unspecified: Secondary | ICD-10-CM

## 2010-12-15 ENCOUNTER — Encounter: Payer: Self-pay | Admitting: Surgery

## 2011-01-30 ENCOUNTER — Encounter: Payer: Self-pay | Admitting: Surgery

## 2011-02-02 ENCOUNTER — Ambulatory Visit (INDEPENDENT_AMBULATORY_CARE_PROVIDER_SITE_OTHER): Payer: Medicare Other | Admitting: Surgery

## 2011-02-02 ENCOUNTER — Encounter: Payer: Self-pay | Admitting: Surgery

## 2011-02-02 VITALS — BP 130/54 | HR 76 | Resp 16 | Ht 70.0 in | Wt 169.0 lb

## 2011-02-02 DIAGNOSIS — I70219 Atherosclerosis of native arteries of extremities with intermittent claudication, unspecified extremity: Secondary | ICD-10-CM

## 2011-02-02 NOTE — Progress Notes (Signed)
[facility]   Patient name: Katelyn [name] MRN: [id number] DOB: [birth year] Sex: female     Chief Complaint  Patient presents with  . PVD    3 month f/up.... blister from support stockings    HISTORY OF PRESENT ILLNESS: The patient is back today for followup. She is status post aortobifemoral bypass graft on 06/27/2010 for an occluded infrarenal abdominal aorta. She did require a 6 minute suprarenal cross-clamp. She also has known severe aortic stenosis. I have been following her for postoperative bilateral edema. This was treated with Lasix as well as compression stockings. I also ordered a CT angiogram with venous phase to rule out an obstructive process giving the severity of her edema. Since her last visit her swelling has decreased, however she has had 3 superficial ulcers on the lateral side of her left ankle. She continues to wear her compression stockings complains of swelling in her thighs as well as in her pelvis area. She does not have any problems walking any more.  Past Medical History  Diagnosis Date  . Hypertension   . Stroke 1982  . Claudication   . Heart murmur   . Muscle pain   . Seroma, postoperative     Left Groin  . Leg pain     Past Surgical History  Procedure Date  . Aortobifemoral bpg 06/27/10  . Abdominal hysterectomy     Partial     History   Social History  . Marital Status: Married    Spouse Name: N/A    Number of Children: N/A  . Years of Education: N/A   Occupational History  . Not on file.   Social History Main Topics  . Smoking status: Former Smoker -- 0.1 packs/day    Types: Cigarettes    Quit date: 06/01/2010  . Smokeless tobacco: Not on file  . Alcohol Use: No  . Drug Use: No  . Sexually Active:    Other Topics Concern  . Not on file   Social History Narrative  . No narrative on file    Family History  Problem Relation Age of Onset  . Heart disease Mother   . Stroke Father   . Hypertension  Father   . Arthritis Brother     Allergies as of 02/02/2011 - Review Complete 02/02/2011  Allergen Reaction Noted  . Adhesive (tape) Rash 09/19/2010  . Latex Rash 09/19/2010    Current Outpatient Prescriptions on File Prior to Visit  Medication Sig Dispense Refill  . amLODipine (NORVASC) 10 MG tablet Take 10 mg by mouth daily.        . cetirizine (ZYRTEC) 10 MG tablet Take 10 mg by mouth daily.        Marland Kitchen estrogens, conjugated, (PREMARIN) 1.25 MG tablet Take 1.25 mg by mouth daily.        Marland Kitchen FOLIC ACID PO Take by mouth daily.        . meloxicam (MOBIC) 15 MG tablet Take 15 mg by mouth daily.        . rosuvastatin (CRESTOR) 20 MG tablet Take 20 mg by mouth daily.        . verapamil (CALAN) 120 MG tablet Take 120 mg by mouth daily.        Marland Kitchen warfarin (COUMADIN) 3 MG tablet Take 3 mg by mouth as directed.           REVIEW OF SYSTEMS: Cardiovascular: No chest pain, chest pressure, . Pulmonary: No productive cough, asthma  or wheezing. Neurologic: dizziness. Hematologic: No bleeding problems or clotting disorders. Musculoskeletal: No joint pain or joint swelling. Gastrointestinal: No blood in stool or hematemesis Genitourinary: No dysuria or hematuria. Psychiatric:: No history of major depression. Integumentary: No rashes or ulcers. Constitutional: No fever or chills.  PHYSICAL EXAMINATION:   Vital signs are BP 130/54  Pulse 76  Resp 16  Ht 5\' 10"  (1.778 m)  Wt 169 lb (76.658 kg)  BMI 24.25 kg/m2  SpO2 99% General: The patient appears their stated age. HEENT:  No gross abnormalities Pulmonary:  Non labored breathing Abdomen: Soft and non-tender Musculoskeletal: There are no major deformities. Neurologic: No focal weakness or paresthesias are detected, Skin: Nearly healed lateral malleolar ulcer Psychiatric: The patient has normal affect. Cardiovascular: Palpable femoral pulses bilaterally   Diagnostic Studies None performed today. Per the patient she had venous reflux  studies and arterial studies done at [facility]  Assessment: Status post aortobifemoral bypass Plan: The patient still has issues with lower extremity edema. She wears her stockings when she can however she has had ulceration on the left foot which has caused her to have to stop wearing her compression stockings. She is continuing to take Lasix to help with her swelling. Reportedly she has had a negative venous reflux study done at her cardiologist. She is currently wearing a Holter monitor because of some dizzy episodes. I am going to refer her today to a lymphedema therapist to see if they can help with her swelling. I also want to make sure that her aortic stenosis is not contributing to her swelling. I will defer to her cardiologist for this. I may see her back in 6 months  V. Charlena Cross, M.D. [facility] Office: [phone] Pager:  [phone]

## 2011-07-31 ENCOUNTER — Telehealth: Payer: Self-pay | Admitting: Surgery

## 2011-07-31 NOTE — Telephone Encounter (Signed)
Spoke with patient to reschedule appointment from 08/03/11 on [facility] schedule to 08/11/11 on [facility]'s schedule per Darel Hong due to [facility] meeting. Confirmed appointment with patient.

## 2011-08-03 ENCOUNTER — Ambulatory Visit: Payer: Medicare Other | Admitting: Surgery

## 2011-08-07 ENCOUNTER — Encounter: Payer: Self-pay | Admitting: Neurosurgery

## 2011-08-10 ENCOUNTER — Encounter: Payer: Self-pay | Admitting: Neurosurgery

## 2011-08-10 ENCOUNTER — Ambulatory Visit (INDEPENDENT_AMBULATORY_CARE_PROVIDER_SITE_OTHER): Payer: Medicare Other | Admitting: Neurosurgery

## 2011-08-10 VITALS — BP 142/83 | HR 80 | Resp 16 | Ht 70.0 in | Wt 167.2 lb

## 2011-08-10 DIAGNOSIS — I70219 Atherosclerosis of native arteries of extremities with intermittent claudication, unspecified extremity: Secondary | ICD-10-CM | POA: Insufficient documentation

## 2011-08-10 NOTE — Progress Notes (Signed)
[facility] [place] HISTORY AND PHYSICAL   CC: Six-month followup for PID Referring Physician: Brabham  History of Present Illness: 71 year old female patient of Dr. Myra Gianotti status post a aorta bifemoral bypass graft in April 2012. She's done well since that time, she doesn't she is attended [facility] clinic in [place] and her lower extremity wounds have healed as well as the edema has resolved. Patient states doing much better her only complaint is numbness from the groin down in her thighs to the big toes of both legs.  Past Medical History  Diagnosis Date  . Hypertension   . Stroke 1982  . Claudication   . Heart murmur   . Muscle pain   . Seroma, postoperative     Left Groin  . Leg pain   . Coronary artery disease     ROS: [x]  Positive   [ ]  Denies    General: [ ]  Weight loss, [ ]  Fever, [ ]  chills Neurologic: [ ]  Dizziness, [ ]  Blackouts, [ ]  Seizure [ ]  Stroke, [ ]  "Mini stroke", [ ]  Slurred speech, [ ]  Temporary blindness; [ ]  weakness in arms or legs, [ ]  Hoarseness Cardiac: [ ]  Chest pain/pressure, [ ]  Shortness of breath at rest [ ]  Shortness of breath with exertion, [ ]  Atrial fibrillation or irregular heartbeat Vascular: [ ]  Pain in legs with walking, [ ]  Pain in legs at rest, [ ]  Pain in legs at night,  [ ]  Non-healing ulcer, [ ]  Blood clot in vein/DVT,   Pulmonary: [ ]  Home oxygen, [ ]  Productive cough, [ ]  Coughing up blood, [ ]  Asthma,  [ ]  Wheezing Musculoskeletal:  [ ]  Arthritis, [ ]  Low back pain, [ ]  Joint pain Hematologic: [ ]  Easy Bruising, [ ]  Anemia; [ ]  Hepatitis Gastrointestinal: [ ]  Blood in stool, [ ]  Gastroesophageal Reflux/heartburn, [ ]  Trouble swallowing Urinary: [ ]  chronic Kidney disease, [ ]  on HD - [ ]  MWF or [ ]  TTHS, [ ]  Burning with urination, [ ]  Difficulty urinating Skin: [ ]  Rashes, [ ]  Wounds Psychological: [ ]  Anxiety, [ ]  Depression   Social History History  Substance Use Topics  . Smoking status: Former Smoker -- 0.1  packs/day    Types: Cigarettes    Quit date: 06/01/2010  . Smokeless tobacco: Not on file  . Alcohol Use: No    Family History Family History  Problem Relation Age of Onset  . Heart disease Mother   . Stroke Father   . Hypertension Father   . Heart disease Father   . Arthritis Brother     Allergies  Allergen Reactions  . Adhesive (Tape) Rash  . Latex Rash    Current Outpatient Prescriptions  Medication Sig Dispense Refill  . amLODipine (NORVASC) 10 MG tablet Take 10 mg by mouth daily.        . cetirizine (ZYRTEC) 10 MG tablet Take 10 mg by mouth daily.        Marland Kitchen estrogens, conjugated, (PREMARIN) 1.25 MG tablet Take 1.25 mg by mouth daily.        Marland Kitchen FOLIC ACID PO Take by mouth daily.        . furosemide (LASIX) 20 MG tablet Take 20 mg by mouth daily.       . meloxicam (MOBIC) 15 MG tablet Take 15 mg by mouth daily.        . potassium chloride (KLOR-CON) 10 MEQ CR tablet Take 10 mEq by mouth daily.        Marland Kitchen  rosuvastatin (CRESTOR) 20 MG tablet Take 20 mg by mouth daily.        . verapamil (CALAN) 120 MG tablet Take 120 mg by mouth daily.        Marland Kitchen warfarin (COUMADIN) 3 MG tablet Take 3 mg by mouth as directed.          Physical Examination  Filed Vitals:   08/10/11 1334  BP: 142/83  Pulse: 80  Resp: 16    Body mass index is 23.99 kg/(m^2).  General:  WDWN in NAD Gait: Normal HEENT: WNL Eyes: Pupils equal Pulmonary: normal non-labored breathing , without Rales, rhonchi,  wheezing Cardiac: RRR, without  Murmurs, rubs or gallops; No carotid bruits Abdomen: soft, NT, no masses Skin: no rashes, ulcers noted Vascular Exam/Pulses: Palpable PT and DP pulses bilaterally  Extremities without ischemic changes, no Gangrene , no cellulitis; no open wounds;  Musculoskeletal: no muscle wasting or atrophy  Neurologic: A&O X 3; Appropriate Affect ; SENSATION: normal; MOTOR FUNCTION:  moving all extremities equally. Speech is fluent/normal  Non-Invasive Vascular  Imaging:N/A  ASSESSMENT/PLAN: Dr. Myra Gianotti spoke with the patient and her husband at length about the numbness from the incision sites in the groin is radiating down to the great toe bilaterally. He also explain that there is no real explanation for this and there having numbness that far down the extremity is unusual. The patient has followed up with Dr. Venetia Maxon in the past regarding her back and we will refer her back over to Dr. Maeola Harman for reevaluation to see if this paresthesia-like feeling is related to radiculopathy from nerve root impingement. Patient and her husband are in agreement with this her questions were encouraged and answered. She'll followup with Dr. Myra Gianotti in 6 months.  Lauree Chandler ANP  Clinic M.D.: Myra Gianotti

## 2011-09-17 ENCOUNTER — Other Ambulatory Visit (HOSPITAL_COMMUNITY): Payer: Self-pay | Admitting: Family Medicine

## 2011-09-17 DIAGNOSIS — M549 Dorsalgia, unspecified: Secondary | ICD-10-CM

## 2011-09-21 ENCOUNTER — Ambulatory Visit (HOSPITAL_COMMUNITY)
Admission: RE | Admit: 2011-09-21 | Discharge: 2011-09-21 | Disposition: A | Discharge status: Home / Self Care | Payer: Medicare Other | Source: Ambulatory Visit | Attending: Family Medicine | Admitting: Family Medicine

## 2011-09-21 DIAGNOSIS — M549 Dorsalgia, unspecified: Secondary | ICD-10-CM

## 2011-09-21 DIAGNOSIS — M545 Low back pain, unspecified: Secondary | ICD-10-CM | POA: Insufficient documentation

## 2011-09-21 DIAGNOSIS — M5126 Other intervertebral disc displacement, lumbar region: Secondary | ICD-10-CM | POA: Insufficient documentation

## 2011-09-21 DIAGNOSIS — R209 Unspecified disturbances of skin sensation: Secondary | ICD-10-CM | POA: Insufficient documentation

## 2011-11-16 ENCOUNTER — Other Ambulatory Visit (HOSPITAL_COMMUNITY): Payer: Self-pay | Admitting: Family Medicine

## 2011-11-16 DIAGNOSIS — Z139 Encounter for screening, unspecified: Secondary | ICD-10-CM

## 2011-12-14 ENCOUNTER — Ambulatory Visit (HOSPITAL_COMMUNITY)
Admission: RE | Admit: 2011-12-14 | Discharge: 2011-12-14 | Disposition: A | Discharge status: Home / Self Care | Payer: Medicare Other | Source: Ambulatory Visit | Attending: Family Medicine | Admitting: Family Medicine

## 2011-12-14 DIAGNOSIS — Z1231 Encounter for screening mammogram for malignant neoplasm of breast: Secondary | ICD-10-CM | POA: Insufficient documentation

## 2011-12-14 DIAGNOSIS — Z139 Encounter for screening, unspecified: Secondary | ICD-10-CM

## 2012-01-14 ENCOUNTER — Other Ambulatory Visit (HOSPITAL_COMMUNITY): Payer: Self-pay | Admitting: Cardiovascular Disease

## 2012-01-14 DIAGNOSIS — I6529 Occlusion and stenosis of unspecified carotid artery: Secondary | ICD-10-CM

## 2012-01-22 ENCOUNTER — Ambulatory Visit (HOSPITAL_COMMUNITY)
Admission: RE | Admit: 2012-01-22 | Discharge: 2012-01-22 | Disposition: A | Discharge status: Home / Self Care | Payer: Medicare Other | Source: Ambulatory Visit | Attending: Cardiovascular Disease | Admitting: Cardiovascular Disease

## 2012-01-22 DIAGNOSIS — M25559 Pain in unspecified hip: Secondary | ICD-10-CM | POA: Insufficient documentation

## 2012-01-22 DIAGNOSIS — IMO0002 Reserved for concepts with insufficient information to code with codable children: Secondary | ICD-10-CM | POA: Insufficient documentation

## 2012-01-22 DIAGNOSIS — I70219 Atherosclerosis of native arteries of extremities with intermittent claudication, unspecified extremity: Secondary | ICD-10-CM | POA: Insufficient documentation

## 2012-01-22 DIAGNOSIS — I6529 Occlusion and stenosis of unspecified carotid artery: Secondary | ICD-10-CM | POA: Insufficient documentation

## 2012-01-22 NOTE — Progress Notes (Signed)
Carotid Duplex Sonogram Completed. Katelyn [name] K 01/22/2012  

## 2012-02-05 ENCOUNTER — Encounter: Payer: Self-pay | Admitting: Surgery

## 2012-02-08 ENCOUNTER — Ambulatory Visit (INDEPENDENT_AMBULATORY_CARE_PROVIDER_SITE_OTHER): Payer: Medicare Other | Admitting: Surgery

## 2012-02-08 ENCOUNTER — Encounter: Payer: Self-pay | Admitting: Surgery

## 2012-02-08 VITALS — BP 152/75 | HR 55 | Resp 16 | Ht 70.0 in | Wt 180.0 lb

## 2012-02-08 DIAGNOSIS — I70219 Atherosclerosis of native arteries of extremities with intermittent claudication, unspecified extremity: Secondary | ICD-10-CM

## 2012-02-08 NOTE — Progress Notes (Signed)
[facility] [place]   Patient name: Katelyn [name] MRN: [id number] DOB: 1940-07-08 Sex: female     Chief Complaint  Patient presents with  . PVD    6 month f/u with claudication - pt complains of the same numbness in thighs that radiates down to toes from last OV    HISTORY OF PRESENT ILLNESS: The patient is back today for followup. She is status post aortobifemoral bypass graft in April of 2012. This was done for bilateral claudication. Postoperatively, she has had issues with swelling. She has been wearing compression stockings. In addition, she has complained of numbness from her hip to her toes bilaterally. She does have a history of back issues. Dr. Venetia Maxon performed injections without benefit to her numbness. She is walking better. An  Past Medical History  Diagnosis Date  . Hypertension   . Stroke 1982  . Claudication   . Heart murmur   . Muscle pain   . Seroma, postoperative     Left Groin  . Leg pain   . Coronary artery disease     Past Surgical History  Procedure Date  . Aortobifemoral bpg 06/27/10  . Abdominal hysterectomy     Partial   . Pr vein bypass graft,aorto-fem-pop 06/12/10    History   Social History  . Marital Status: Married    Spouse Name: N/A    Number of Children: N/A  . Years of Education: N/A   Occupational History  . Not on file.   Social History Main Topics  . Smoking status: Former Smoker -- 0.1 packs/day    Types: Cigarettes    Quit date: 06/01/2010  . Smokeless tobacco: Never Used  . Alcohol Use: No  . Drug Use: No  . Sexually Active: Not on file   Other Topics Concern  . Not on file   Social History Narrative  . No narrative on file    Family History  Problem Relation Age of Onset  . Heart disease Mother   . Stroke Father   . Hypertension Father   . Heart disease Father   . Arthritis Brother     Allergies as of 02/08/2012 - Review Complete 02/08/2012  Allergen Reaction Noted  . Adhesive (tape)  Rash 09/19/2010  . Latex Rash 09/19/2010    Current Outpatient Prescriptions on File Prior to Visit  Medication Sig Dispense Refill  . cetirizine (ZYRTEC) 10 MG tablet Take 10 mg by mouth daily.        Marland Kitchen estrogens, conjugated, (PREMARIN) 1.25 MG tablet Take 1.25 mg by mouth daily.        Marland Kitchen FOLIC ACID PO Take by mouth daily.        . furosemide (LASIX) 20 MG tablet Take 20 mg by mouth daily.       . meloxicam (MOBIC) 15 MG tablet Take 15 mg by mouth daily.        . potassium chloride (KLOR-CON) 10 MEQ CR tablet Take 10 mEq by mouth daily.        . rosuvastatin (CRESTOR) 20 MG tablet Take 20 mg by mouth daily.        . verapamil (CALAN) 120 MG tablet Take 120 mg by mouth daily.        Marland Kitchen warfarin (COUMADIN) 3 MG tablet Take 3 mg by mouth as directed.        Marland Kitchen amLODipine (NORVASC) 10 MG tablet Take 10 mg by mouth daily.  REVIEW OF SYSTEMS: No changes from previous visit  PHYSICAL EXAMINATION:   Vital signs are BP 152/75  Pulse 55  Resp 16  Ht 5\' 10"  (1.778 m)  Wt 180 lb (81.647 kg)  BMI 25.83 kg/m2  SpO2 97% General: The patient appears their stated age. HEENT:  No gross abnormalities Pulmonary:  Non labored breathing Abdomen: Soft and non-tender midline incision well-healed, no hernia Musculoskeletal: There are no major deformities. Neurologic: No focal weakness or paresthesias are detected, Skin: There are no ulcer or rashes noted. Psychiatric: The patient has normal affect. Cardiovascular: Palpable femoral pulses  Diagnostic Studies None  Assessment: Status post aortobifemoral bypass graft Plan: Overall, the patient is doing very well. She has significantly improved blood flow to both legs. Her claudication symptoms have resolved. With regards to her numbness in both legs from her hip to her toes, I am unsure of the etiology. She has been referred back to her neurosurgeon who has performed injections without benefit. I have recommended Neurontin or Elavil,  however she cannot take these medications. She states that she can deal with these issues. She is satisfied that her walking ability has improved. She'll continue her compression stockings. She'll see me back in one year with ankle-brachial indices.  [name], M.D. [facility] Office: [phone] Pager:  [phone]

## 2012-02-15 ENCOUNTER — Ambulatory Visit: Payer: Medicare Other | Admitting: Surgery

## 2012-02-16 ENCOUNTER — Other Ambulatory Visit (HOSPITAL_COMMUNITY): Payer: Self-pay | Admitting: Cardiovascular Disease

## 2012-02-16 DIAGNOSIS — I350 Nonrheumatic aortic (valve) stenosis: Secondary | ICD-10-CM

## 2012-02-25 ENCOUNTER — Ambulatory Visit (HOSPITAL_COMMUNITY)
Admission: RE | Admit: 2012-02-25 | Discharge: 2012-02-25 | Disposition: A | Discharge status: Home / Self Care | Payer: Medicare Other | Source: Ambulatory Visit | Attending: Cardiovascular Disease | Admitting: Cardiovascular Disease

## 2012-02-25 DIAGNOSIS — I350 Nonrheumatic aortic (valve) stenosis: Secondary | ICD-10-CM

## 2012-02-25 DIAGNOSIS — I359 Nonrheumatic aortic valve disorder, unspecified: Secondary | ICD-10-CM | POA: Insufficient documentation

## 2012-02-25 NOTE — Progress Notes (Signed)
2D Echo Performed 02/25/2012    Clearence Ped, RCS

## 2012-11-15 ENCOUNTER — Other Ambulatory Visit (HOSPITAL_COMMUNITY): Payer: Self-pay | Admitting: Cardiovascular Disease

## 2012-11-15 DIAGNOSIS — I350 Nonrheumatic aortic (valve) stenosis: Secondary | ICD-10-CM

## 2012-11-16 ENCOUNTER — Other Ambulatory Visit (HOSPITAL_COMMUNITY): Payer: Self-pay | Admitting: Family Medicine

## 2012-11-16 DIAGNOSIS — Z139 Encounter for screening, unspecified: Secondary | ICD-10-CM

## 2012-12-06 ENCOUNTER — Ambulatory Visit (HOSPITAL_COMMUNITY)
Admission: RE | Admit: 2012-12-06 | Discharge: 2012-12-06 | Disposition: A | Discharge status: Home / Self Care | Payer: Medicare Other | Source: Ambulatory Visit | Attending: Cardiovascular Disease | Admitting: Cardiovascular Disease

## 2012-12-06 DIAGNOSIS — I350 Nonrheumatic aortic (valve) stenosis: Secondary | ICD-10-CM

## 2012-12-06 DIAGNOSIS — I359 Nonrheumatic aortic valve disorder, unspecified: Secondary | ICD-10-CM

## 2012-12-06 NOTE — Progress Notes (Signed)
2D Echo Performed 12/06/2012    Kristain Hu, RCS  

## 2012-12-13 ENCOUNTER — Ambulatory Visit: Payer: Medicare Other | Admitting: Cardiovascular Disease

## 2012-12-13 ENCOUNTER — Encounter: Payer: Self-pay | Admitting: Cardiovascular Disease

## 2012-12-13 ENCOUNTER — Ambulatory Visit (INDEPENDENT_AMBULATORY_CARE_PROVIDER_SITE_OTHER): Payer: Medicare Other | Admitting: Cardiovascular Disease

## 2012-12-13 VITALS — BP 158/78 | HR 54 | Ht 70.5 in | Wt 184.5 lb

## 2012-12-13 DIAGNOSIS — I10 Essential (primary) hypertension: Secondary | ICD-10-CM

## 2012-12-13 DIAGNOSIS — I359 Nonrheumatic aortic valve disorder, unspecified: Secondary | ICD-10-CM

## 2012-12-13 DIAGNOSIS — I739 Peripheral vascular disease, unspecified: Secondary | ICD-10-CM | POA: Insufficient documentation

## 2012-12-13 DIAGNOSIS — E782 Mixed hyperlipidemia: Secondary | ICD-10-CM | POA: Insufficient documentation

## 2012-12-13 DIAGNOSIS — E785 Hyperlipidemia, unspecified: Secondary | ICD-10-CM

## 2012-12-13 DIAGNOSIS — I2510 Atherosclerotic heart disease of native coronary artery without angina pectoris: Secondary | ICD-10-CM

## 2012-12-13 DIAGNOSIS — I350 Nonrheumatic aortic (valve) stenosis: Secondary | ICD-10-CM

## 2012-12-13 NOTE — Progress Notes (Signed)
Patient ID: Katelyn [name], female   DOB: 1941/01/22, 72 y.o.   MRN: [id number]     HPI: Katelyn [name], is a 72 y.o. female who presents to the office for 6 months cardiology evaluation  Katelyn [name] is now 72 years old. She has multiple problems which include coronary artery disease: She status post cardiac catheterization in April 2012 which revealed pulmonary RCA occlusion with collaterals, 50% LAD stenosis, 30% circumflex stenosis, and 40% marginal stenosis. 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 Darryl Lent. She has a history of hypertension with grade 2 diastolic dysfunction, hyperlipidemia, and has developed moderately severe aortic valve stenosis. An echo Doppler study in May 2013 showed a mean transvalvular aortic gradient of 34 with a maximum of 56. In December 2013, her mean gradient was 32 and peak 56 with a valve area of 0.84. She recently underwent a sig a 10 month followup echo Doppler study on 12/06/2012. This continued to show normal systolic function with an ejection fraction of 60-65%. She had grade 1 diastolic dysfunction. Peak gradients were now 61 mm and mean gradient was 38 mm. A. the aortic valve area 0.8-0.9 cm. She had mild to moderate aortic insufficiency. Had mildly thickened mitral valve leaflets with mild MR. The left atrium was mildly dilated. She had mild TR. Pulmonary pressure was mildly increased at 33 mm.  Clinically, Katelyn [name] denies any change in symptom status. Specifically, there is no chest pain. She denies presyncope or syncope. She denies any increasing shortness of breath or CHF symptoms. She presents for followup evaluation.  Past Medical History  Diagnosis Date  . Hypertension   . Stroke 1982  . Claudication   . Heart murmur   . Muscle pain   . Seroma, postoperative     Left Groin  . Leg pain   . Coronary  artery disease     Past Surgical History  Procedure Laterality Date  . Aortobifemoral bpg  06/27/10  . Abdominal hysterectomy      Partial   . Pr vein bypass graft,aorto-fem-pop  06/12/10    Allergies  Allergen Reactions  . Adhesive [Tape] Rash  . Latex Rash    Current Outpatient Prescriptions  Medication Sig Dispense Refill  . amLODipine (NORVASC) 10 MG tablet Take 10 mg by mouth daily.        . cetirizine (ZYRTEC) 10 MG tablet Take 10 mg by mouth daily.        Marland Kitchen estrogens, conjugated, (PREMARIN) 1.25 MG tablet Take 1.25 mg by mouth daily.        Marland Kitchen FOLIC ACID PO Take by mouth daily.        . furosemide (LASIX) 20 MG tablet Take 20 mg by mouth as needed.       Marland Kitchen lisinopril (PRINIVIL,ZESTRIL) 5 MG tablet Take 5 mg by mouth daily.      . meloxicam (MOBIC) 15 MG tablet Take 15 mg by mouth daily.        . metoprolol succinate (TOPROL-XL) 25 MG 24 hr tablet Take 1 tablet by mouth daily.      . potassium chloride (KLOR-CON) 10 MEQ CR tablet Take 10 mEq by mouth as needed.       . rosuvastatin (CRESTOR) 20 MG tablet Take 20 mg by mouth daily.        Marland Kitchen warfarin (COUMADIN) 3 MG tablet Take 3 mg by  mouth as directed.         No current facility-administered medications for this visit.    History   Social History  . Marital Status: Married    Spouse Name: N/A    Number of Children: N/A  . Years of Education: N/A   Occupational History  . Not on file.   Social History Main Topics  . Smoking status: Former Smoker -- 0.10 packs/day    Types: Cigarettes    Quit date: 06/01/2010  . Smokeless tobacco: Never Used  . Alcohol Use: No  . Drug Use: No  . Sexual Activity: Not on file   Other Topics Concern  . Not on file   Social History Narrative  . No narrative on file    Family History  Problem Relation Age of Onset  . Heart disease Mother   . Stroke Father   . Hypertension Father   . Heart disease Father   . Arthritis Brother    Social history is notable that she is  married has one child. She quit tobacco in April 2012 after a long-standing tobacco history having started smoking at age 75.   ROS is negative for fevers, chills or night sweats. She denies rash. She denies tremors. She denies wheezing. She denies any shortness of breath. There is no presyncope or syncope. She denies any cough. There is no chest pressure. She denies change in abdominal symptoms. She denies claudication. She denies bleeding. She denies hematuria or hematochezia. There are no paresthesias.  Other comprehensive 12 point system review is negative.  PE BP 158/78  Pulse 54  Ht 5' 10.5" (1.791 m)  Wt 184 lb 8 oz (83.689 kg)  BMI 26.09 kg/m2  General: Alert, oriented, no distress.  Skin: normal turgor, no rashes HEENT: Normocephalic, atraumatic. Pupils round and reactive; sclera anicteric;no lid lag.  Nose without nasal septal hypertrophy Mouth/Parynx benign; Mallinpatti scale 2 Neck: No JVD, no carotid briuts Lungs: clear to ausculatation and percussion; no wheezing or rales Heart: RRR, s1 s2 normal 2/6 systolic murmur compatible with her aortic stenosis that is mid peaking. 2/6 diastolic murmur compatible with her aortic insufficiency. Abdomen: soft, nontender; no hepatosplenomehaly, BS+; abdominal aorta nontender and not dilated by palpation. Pulses 2+ Extremities: no clubbing cyanosis or edema, Homan's sign negative  Neurologic: grossly nonfocal Psychological: Normal affect and mood.  ECG: Sinus bradycardia with left axis deviation. Left bundle branch block with repolarization changes  LABS:  BMET    Component Value Date/Time   NA 143 06/30/2010 0919   K 3.8 06/30/2010 0919   CL 112 06/30/2010 0919   CO2 25 06/30/2010 0919   GLUCOSE 134* 06/30/2010 0919   BUN 6 06/30/2010 0919   CREATININE 0.71 06/30/2010 0919   CALCIUM 7.8* 06/30/2010 0919   GFRNONAA >60 06/30/2010 0919   GFRAA  Value: >60        The eGFR has been calculated using the MDRD equation. This calculation has  not been validated in all clinical situations. eGFR's persistently <60 mL/min signify possible Chronic Kidney Disease. 06/30/2010 0919     Hepatic Function Panel     Component Value Date/Time   PROT 4.6* 06/28/2010 0338   ALBUMIN 2.4* 06/28/2010 0338   AST 33 06/28/2010 0338   ALT 18 06/28/2010 0338   ALKPHOS 46 06/28/2010 0338   BILITOT 0.3 06/28/2010 0338     CBC    Component Value Date/Time   WBC 9.0 07/01/2010 0416   RBC 3.32* 07/01/2010 0416  HGB 9.9* 07/01/2010 0416   HCT 30.3* 07/01/2010 0416   PLT 84* 07/01/2010 0416   MCV 91.3 07/01/2010 0416   MCH 29.8 07/01/2010 0416   MCHC 32.7 07/01/2010 0416   RDW 14.7 07/01/2010 0416     BNP No results found for this basename: probnp    Lipid Panel  No results found for this basename: chol, trig, hdl, cholhdl, vldl, ldlcalc     RADIOLOGY: No results found.    ASSESSMENT AND PLAN:  From a cardiac standpoint, Ms. Lehenbauer continues to do fairly well. She has documented coronary artery disease with mid RCA occlusion but good collateralization from her left coronary system. She also has peripheral vascular disease and is status post aortobifem graft surgery with bilateral fundoplasty. Her blood pressure today was improved when rechecked by me at 130/80. I did review her echo Doppler findings. She does have moderately severe aortic valve stenosis. There is only minimal change from her last echo of 10 months previously. Her peak gradient is now 61 mm with a mean gradient of 38 mm. She does have mild to moderate aortic insufficiency I had a long discussion with her concerning potential symptoms associated with her aortic valve disease. She clinically remains stable. I have recommended continued close followup. In 8 months, I'm recommending she undergo a followup echo Doppler study for reassessment they'll see her back in the office for followup evaluation at that time.     Lennette Bihari, MD, [facility]  12/13/2012 12:45 PM

## 2012-12-13 NOTE — Progress Notes (Signed)
Quick Note:  Discussed at patient appointment on 10/14. ______

## 2012-12-13 NOTE — Patient Instructions (Signed)
Your physician recommends that you schedule a follow-up appointment  And ECHO in 8 months.

## 2012-12-14 ENCOUNTER — Encounter: Payer: Self-pay | Admitting: Cardiovascular Disease

## 2012-12-20 ENCOUNTER — Ambulatory Visit (HOSPITAL_COMMUNITY)
Admission: RE | Admit: 2012-12-20 | Discharge: 2012-12-20 | Disposition: A | Discharge status: Home / Self Care | Payer: Medicare Other | Source: Ambulatory Visit | Attending: Family Medicine | Admitting: Family Medicine

## 2012-12-20 ENCOUNTER — Other Ambulatory Visit: Payer: Self-pay | Admitting: *Deleted

## 2012-12-20 DIAGNOSIS — Z1231 Encounter for screening mammogram for malignant neoplasm of breast: Secondary | ICD-10-CM | POA: Insufficient documentation

## 2012-12-20 DIAGNOSIS — Z139 Encounter for screening, unspecified: Secondary | ICD-10-CM

## 2012-12-20 MED ORDER — METOPROLOL SUCCINATE ER 25 MG PO TB24: 25.0000 mg | ORAL_TABLET | Freq: Every day | ORAL | Status: DC

## 2012-12-20 NOTE — Telephone Encounter (Signed)
Rx was sent to pharmacy electronically. 

## 2013-01-05 ENCOUNTER — Other Ambulatory Visit: Payer: Self-pay | Admitting: Surgery

## 2013-01-05 DIAGNOSIS — I739 Peripheral vascular disease, unspecified: Secondary | ICD-10-CM

## 2013-01-30 ENCOUNTER — Other Ambulatory Visit: Payer: Self-pay | Admitting: *Deleted

## 2013-01-30 NOTE — Telephone Encounter (Signed)
Called in folbic to patient pharmacy #[phone] refils

## 2013-02-03 ENCOUNTER — Encounter: Payer: Self-pay | Admitting: Family

## 2013-02-06 ENCOUNTER — Ambulatory Visit (INDEPENDENT_AMBULATORY_CARE_PROVIDER_SITE_OTHER): Payer: Medicare Other | Admitting: Family

## 2013-02-06 ENCOUNTER — Encounter: Payer: Self-pay | Admitting: Family

## 2013-02-06 ENCOUNTER — Encounter (HOSPITAL_COMMUNITY): Payer: Medicare Other

## 2013-02-06 ENCOUNTER — Encounter (INDEPENDENT_AMBULATORY_CARE_PROVIDER_SITE_OTHER): Payer: Self-pay

## 2013-02-06 ENCOUNTER — Ambulatory Visit (HOSPITAL_COMMUNITY)
Admission: RE | Admit: 2013-02-06 | Discharge: 2013-02-06 | Disposition: A | Discharge status: Home / Self Care | Payer: Medicare Other | Source: Ambulatory Visit | Attending: Family | Admitting: Family

## 2013-02-06 VITALS — BP 146/77 | HR 70 | Resp 16 | Ht 70.5 in | Wt 180.0 lb

## 2013-02-06 DIAGNOSIS — I70219 Atherosclerosis of native arteries of extremities with intermittent claudication, unspecified extremity: Secondary | ICD-10-CM

## 2013-02-06 DIAGNOSIS — Z48812 Encounter for surgical aftercare following surgery on the circulatory system: Secondary | ICD-10-CM

## 2013-02-06 DIAGNOSIS — I739 Peripheral vascular disease, unspecified: Secondary | ICD-10-CM | POA: Insufficient documentation

## 2013-02-06 NOTE — Progress Notes (Signed)
[facility] [place] HISTORY AND PHYSICAL -PAD  History of Present Illness Katelyn [name] is a 72 y.o. female patient of Katelyn [name] who is status post aortobifemoral bypass graft in April of 2012. This was done for bilateral claudication. Postoperatively, she has had issues with swelling.  She no longer has swelling in her lower legs since she stopped amlodipine, but she still wears compression stockings. She still has numbness from the left groin to her toes as constant and mild to moderate She does have a history of back issues. Dr. Venetia [name] performed injections without benefit to her numbness.  She denies claudication symptoms, denies non-healing wounds. She had 2 strokes in 1982, states Dr. Nicholaus [name], cardiologist,  has been monitoring her carotid arteries, she states that she has a known heart murmur. Patient states dramatic improvement in ability to walk without claudication symptoms since the above surgery; prior to the aortobifemoral bypass graft she would get cludication symptoms in both thighs and calves after 5 steps, and her toes were turning black.  Now her toes are pink and warm.  Patient denies New Medical or Surgical History.  Pt Diabetic: No Pt smoker: former smoker, quit 2 years ago  Pt meds include: Statin :Yes Betablocker: Yes ASA: Yes Other anticoagulants/antiplatelets: takes coumadin for ICA stenosis and since she had strokes, prescribed by Dr. Dickey [name].  Past Medical History  Diagnosis Date  . Hypertension   . Claudication   . Heart murmur   . Muscle pain   . Seroma, postoperative     Left Groin  . Leg pain   . Coronary artery disease   . Stroke 1982    X's 2    Social History History  Substance Use Topics  . Smoking status: Former Smoker -- 0.10 packs/day    Types: Cigarettes    Quit date: 06/01/2010  . Smokeless tobacco: Never Used  . Alcohol Use: No    Family History Family History  Problem Relation Age of Onset  . Heart  disease Mother   . Stroke Father   . Hypertension Father   . Heart disease Father   . Arthritis Brother   . Diabetes Daughter     Past Surgical History  Procedure Laterality Date  . Aortobifemoral bpg  06/27/10  . Abdominal hysterectomy      Partial   . Pr vein bypass graft,aorto-fem-pop  06/12/10    Allergies  Allergen Reactions  . Adhesive [Tape] Rash  . Latex Rash    Current Outpatient Prescriptions  Medication Sig Dispense Refill  . amLODipine (NORVASC) 10 MG tablet Take 10 mg by mouth daily.        . cetirizine (ZYRTEC) 10 MG tablet Take 10 mg by mouth daily.        Marland Kitchen estrogens, conjugated, (PREMARIN) 1.25 MG tablet Take 1.25 mg by mouth daily.        Marland Kitchen FOLIC ACID PO Take by mouth daily.        . furosemide (LASIX) 20 MG tablet Take 20 mg by mouth as needed.       Marland Kitchen lisinopril (PRINIVIL,ZESTRIL) 5 MG tablet Take 5 mg by mouth daily.      . meloxicam (MOBIC) 15 MG tablet Take 15 mg by mouth daily.        . metoprolol succinate (TOPROL-XL) 25 MG 24 hr tablet Take 1 tablet (25 mg total) by mouth daily.  30 tablet  11  . potassium chloride (KLOR-CON) 10 MEQ CR tablet Take 10 mEq  by mouth as needed.       . rosuvastatin (CRESTOR) 20 MG tablet Take 20 mg by mouth daily.        Marland Kitchen warfarin (COUMADIN) 3 MG tablet Take 3 mg by mouth as directed.         No current facility-administered medications for this visit.   Physical Examination  Filed Vitals:   02/06/13 1530  BP: 146/77  Pulse: 70  Resp: 16   Filed Weights   02/06/13 1530  Weight: 180 lb (81.647 kg)   Body mass index is 25.45 kg/(m^2).   General: A&O x 3, WDWN,  Gait: normal Eyes: PERRLA, Pulmonary: CTAB, without wheezes , rales or rhonchi, chronic dry cough. Cardiac: regular Rythm , with murmur          Carotid Bruits Left Right   Transmitted cardiac murmur Transmitted cardiac murmur  Aorta: is slightly palpable. Radial pulses: 2+ and equal                           VASCULAR EXAM: Extremities  without ischemic changes  without Gangrene; without open wounds.                                                                                                          LE Pulses LEFT RIGHT       FEMORAL   palpable   palpable        POPLITEAL  not palpable   not palpable       POSTERIOR TIBIAL   palpable    palpable        DORSALIS PEDIS      ANTERIOR TIBIAL  palpable   palpable    Abdomen: soft, NT, no masses. Skin: no rashes, no ulcers noted. Musculoskeletal: no muscle wasting or atrophy.  Neurologic: A&O X 3; Appropriate Affect ; SENSATION: normal; MOTOR FUNCTION:  moving all extremities equally, motor strength 5/5 throughout. Speech is fluent/normal. CN 2-12 intact.    Non-Invasive Vascular Imaging: DATE: 02/06/2013 ABI: RIGHT 1.12, Waveforms: triphasic;  LEFT 1.09, Waveforms: tri and biphasic  ASSESSMENT: Katelyn [name] is a 72 y.o. female who is status post aortobifemoral bypass graft in April of 2012 and had dramatic resolution of her severe bilateral claudication symptoms since this surgery. Her ABI's today indicate no arterial occlusive disease. She has no ischemic changes in her lower extremities.  PLAN:  Patient advised to speak with her PCP re her chronic dry cough. I discussed in depth with the patient the nature of atherosclerosis, and emphasized the importance of maximal medical management including strict control of blood pressure, blood glucose, and lipid levels, obtaining regular exercise, and cessation of smoking.  The patient is aware that without maximal medical management the underlying atherosclerotic disease process will progress, limiting the benefit of any interventions. Based on the patient's vascular studies and examination, pt will return to clinic in 1 year for ABI's.  The patient was given information about PAD including signs, symptoms, treatment, what symptoms should prompt the patient  to seek immediate medical care, and risk reduction measures to  take.  Katelyn March, RN, MSN, FNP-C [facility] Phone: [phone]  Clinic MD: Katelyn [name]  02/06/2013 4:05 PM

## 2013-02-06 NOTE — Patient Instructions (Signed)
Peripheral Vascular Disease Peripheral Vascular Disease (PVD), also called Peripheral Arterial Disease (PAD), is a circulation problem caused by cholesterol (atherosclerotic plaque) deposits in the arteries. PVD commonly occurs in the lower extremities (legs) but it can occur in other areas of the body, such as your arms. The cholesterol buildup in the arteries reduces blood flow which can cause pain and other serious problems. The presence of PVD can place a person at risk for Coronary Artery Disease (CAD).  CAUSES  Causes of PVD can be many. It is usually associated with more than one risk factor such as:   High Cholesterol.  Smoking.  Diabetes.  Lack of exercise or inactivity.  High blood pressure (hypertension).  Obesity.  Family history. SYMPTOMS   When the lower extremities are affected, patients with PVD may experience:  Leg pain with exertion or physical activity. This is called INTERMITTENT CLAUDICATION. This may present as cramping or numbness with physical activity. The location of the pain is associated with the level of blockage. For example, blockage at the abdominal level (distal abdominal aorta) may result in buttock or hip pain. Lower leg arterial blockage may result in calf pain.  As PVD becomes more severe, pain can develop with less physical activity.  In people with severe PVD, leg pain may occur at rest.  Other PVD signs and symptoms:  Leg numbness or weakness.  Coldness in the affected leg or foot, especially when compared to the other leg.  A change in leg color.  Patients with significant PVD are more prone to ulcers or sores on toes, feet or legs. These may take longer to heal or may reoccur. The ulcers or sores can become infected.  If signs and symptoms of PVD are ignored, gangrene may occur. This can result in the loss of toes or loss of an entire limb.  Not all leg pain is related to PVD. Other medical conditions can cause leg pain such  as:  Blood clots (embolism) or Deep Vein Thrombosis.  Inflammation of the blood vessels (vasculitis).  Spinal stenosis. DIAGNOSIS  Diagnosis of PVD can involve several different types of tests. These can include:  Pulse Volume Recording Method (PVR). This test is simple, painless and does not involve the use of X-rays. PVR involves measuring and comparing the blood pressure in the arms and legs. An ABI (Ankle-Brachial Index) is calculated. The normal ratio of blood pressures is 1. As this number becomes smaller, it indicates more severe disease.  < 0.95  indicates significant narrowing in one or more leg vessels.  <0.8 there will usually be pain in the foot, leg or buttock with exercise.  <0.4 will usually have pain in the legs at rest.  <0.25  usually indicates limb threatening PVD.  Doppler detection of pulses in the legs. This test is painless and checks to see if you have a pulses in your legs/feet.  A dye or contrast material (a substance that highlights the blood vessels so they show up on x-ray) may be given to help your caregiver better see the arteries for the following tests. The dye is eliminated from your body by the kidney's. Your caregiver may order blood work to check your kidney function and other laboratory values before the following tests are performed:  Magnetic Resonance Angiography (MRA). An MRA is a picture study of the blood vessels and arteries. The MRA machine uses a large magnet to produce images of the blood vessels.  Computed Tomography Angiography (CTA). A CTA is a   specialized x-ray that looks at how the blood flows in your blood vessels. An IV may be inserted into your arm so contrast dye can be injected.  Angiogram. Is a procedure that uses x-rays to look at your blood vessels. This procedure is minimally invasive, meaning a small incision (cut) is made in your groin. A small tube (catheter) is then inserted into the artery of your groin. The catheter is  guided to the blood vessel or artery your caregiver wants to examine. Contrast dye is injected into the catheter. X-rays are then taken of the blood vessel or artery. After the images are obtained, the catheter is taken out. TREATMENT  Treatment of PVD involves many interventions which may include:  Lifestyle changes:  Quitting smoking.  Exercise.  Following a low fat, low cholesterol diet.  Control of diabetes.  Foot care is very important to the PVD patient. Good foot care can help prevent infection.  Medication:  Cholesterol-lowering medicine.  Blood pressure medicine.  Anti-platelet drugs.  Certain medicines may reduce symptoms of Intermittent Claudication.  Interventional/Surgical options:  Angioplasty. An Angioplasty is a procedure that inflates a balloon in the blocked artery. This opens the blocked artery to improve blood flow.  Stent Implant. A wire mesh tube (stent) is placed in the artery. The stent expands and stays in place, allowing the artery to remain open.  Peripheral Bypass Surgery. This is a surgical procedure that reroutes the blood around a blocked artery to help improve blood flow. This type of procedure may be performed if Angioplasty or stent implants are not an option. SEEK IMMEDIATE MEDICAL CARE IF:   You develop pain or numbness in your arms or legs.  Your arm or leg turns cold, becomes blue in color.  You develop redness, warmth, swelling and pain in your arms or legs. MAKE SURE YOU:   Understand these instructions.  Will watch your condition.  Will get help right away if you are not doing well or get worse. Document Released: 03/26/2004 Document Revised: 05/11/2011 Document Reviewed: 02/21/2008 [employer] Patient Information 2014 [employer].  

## 2013-02-07 NOTE — Addendum Note (Signed)
Addended by: Sharee Pimple on: 02/07/2013 09:31 AM   Modules accepted: Orders

## 2013-02-28 ENCOUNTER — Other Ambulatory Visit: Payer: Self-pay | Admitting: Cardiovascular Disease

## 2013-02-28 NOTE — Telephone Encounter (Signed)
Rx was sent to pharmacy electronically. 

## 2013-04-14 ENCOUNTER — Other Ambulatory Visit (HOSPITAL_COMMUNITY): Payer: Self-pay | Admitting: Cardiovascular Disease

## 2013-04-14 DIAGNOSIS — I6529 Occlusion and stenosis of unspecified carotid artery: Secondary | ICD-10-CM

## 2013-04-18 ENCOUNTER — Encounter (HOSPITAL_COMMUNITY): Payer: Medicare Other

## 2013-04-25 ENCOUNTER — Encounter (HOSPITAL_COMMUNITY): Payer: Medicare Other

## 2013-05-03 ENCOUNTER — Ambulatory Visit (HOSPITAL_COMMUNITY)
Admission: RE | Admit: 2013-05-03 | Discharge: 2013-05-03 | Disposition: A | Discharge status: Home / Self Care | Payer: Medicare Other | Source: Ambulatory Visit | Attending: Cardiovascular Disease | Admitting: Cardiovascular Disease

## 2013-05-03 DIAGNOSIS — I6529 Occlusion and stenosis of unspecified carotid artery: Secondary | ICD-10-CM | POA: Insufficient documentation

## 2013-05-03 NOTE — Progress Notes (Signed)
Carotid Duplex Completed. Katelyn [name], BS, RDMS, RVT  

## 2013-11-04 ENCOUNTER — Other Ambulatory Visit (HOSPITAL_COMMUNITY): Payer: Self-pay | Admitting: Cardiovascular Disease

## 2013-11-07 NOTE — Telephone Encounter (Signed)
Rx refill sent to patient pharmacy   

## 2013-12-01 ENCOUNTER — Other Ambulatory Visit (HOSPITAL_COMMUNITY): Payer: Self-pay | Admitting: Family Medicine

## 2013-12-01 DIAGNOSIS — Z1231 Encounter for screening mammogram for malignant neoplasm of breast: Secondary | ICD-10-CM

## 2013-12-22 ENCOUNTER — Ambulatory Visit (HOSPITAL_COMMUNITY)
Admission: RE | Admit: 2013-12-22 | Discharge: 2013-12-22 | Disposition: A | Discharge status: Home / Self Care | Payer: Medicare Other | Source: Ambulatory Visit | Attending: Family Medicine | Admitting: Family Medicine

## 2013-12-22 DIAGNOSIS — Z1231 Encounter for screening mammogram for malignant neoplasm of breast: Secondary | ICD-10-CM

## 2013-12-27 ENCOUNTER — Ambulatory Visit (HOSPITAL_COMMUNITY): Payer: Medicare Other

## 2013-12-28 ENCOUNTER — Ambulatory Visit (HOSPITAL_COMMUNITY)
Admission: RE | Admit: 2013-12-28 | Discharge: 2013-12-28 | Disposition: A | Discharge status: Home / Self Care | Payer: Medicare Other | Source: Ambulatory Visit | Attending: Family Medicine | Admitting: Family Medicine

## 2013-12-28 DIAGNOSIS — Z1231 Encounter for screening mammogram for malignant neoplasm of breast: Secondary | ICD-10-CM | POA: Diagnosis present

## 2014-01-01 ENCOUNTER — Other Ambulatory Visit: Payer: Self-pay | Admitting: Family Medicine

## 2014-01-01 DIAGNOSIS — R928 Other abnormal and inconclusive findings on diagnostic imaging of breast: Secondary | ICD-10-CM

## 2014-01-09 ENCOUNTER — Other Ambulatory Visit (HOSPITAL_COMMUNITY): Payer: Self-pay | Admitting: Cardiovascular Disease

## 2014-01-16 ENCOUNTER — Ambulatory Visit (HOSPITAL_COMMUNITY)
Admission: RE | Admit: 2014-01-16 | Discharge: 2014-01-16 | Disposition: A | Discharge status: Home / Self Care | Payer: Medicare Other | Source: Ambulatory Visit | Attending: Family Medicine | Admitting: Family Medicine

## 2014-01-16 ENCOUNTER — Other Ambulatory Visit: Payer: Self-pay | Admitting: Family Medicine

## 2014-01-16 DIAGNOSIS — R928 Other abnormal and inconclusive findings on diagnostic imaging of breast: Secondary | ICD-10-CM | POA: Diagnosis present

## 2014-02-03 ENCOUNTER — Other Ambulatory Visit (HOSPITAL_COMMUNITY): Payer: Self-pay | Admitting: Cardiovascular Disease

## 2014-02-05 NOTE — Telephone Encounter (Signed)
Rx was sent to pharmacy electronically. 

## 2014-02-06 ENCOUNTER — Ambulatory Visit (INDEPENDENT_AMBULATORY_CARE_PROVIDER_SITE_OTHER): Payer: Medicare Other | Admitting: Cardiovascular Disease

## 2014-02-06 ENCOUNTER — Encounter: Payer: Self-pay | Admitting: *Deleted

## 2014-02-06 ENCOUNTER — Encounter: Payer: Self-pay | Admitting: Cardiovascular Disease

## 2014-02-06 VITALS — BP 150/72 | HR 58 | Ht 70.0 in | Wt 182.4 lb

## 2014-02-06 DIAGNOSIS — I70219 Atherosclerosis of native arteries of extremities with intermittent claudication, unspecified extremity: Secondary | ICD-10-CM

## 2014-02-06 DIAGNOSIS — I350 Nonrheumatic aortic (valve) stenosis: Secondary | ICD-10-CM

## 2014-02-06 DIAGNOSIS — I739 Peripheral vascular disease, unspecified: Secondary | ICD-10-CM

## 2014-02-06 DIAGNOSIS — I6529 Occlusion and stenosis of unspecified carotid artery: Secondary | ICD-10-CM

## 2014-02-06 DIAGNOSIS — E785 Hyperlipidemia, unspecified: Secondary | ICD-10-CM

## 2014-02-06 DIAGNOSIS — I359 Nonrheumatic aortic valve disorder, unspecified: Secondary | ICD-10-CM

## 2014-02-06 DIAGNOSIS — I10 Essential (primary) hypertension: Secondary | ICD-10-CM

## 2014-02-06 MED ORDER — VALSARTAN 80 MG PO TABS: 80.0000 mg | ORAL_TABLET | Freq: Every day | ORAL | Status: DC

## 2014-02-06 NOTE — Patient Instructions (Signed)
Your physician recommends that you schedule a follow-up appointment in: 2 MONTHS WITH DR Claiborne Billings  STOP LISINOPRIL  START VALSARTAN 80 MG ONCE DAILY  Your physician has requested that you have an echocardiogram. Echocardiography is a painless test that uses sound waves to create images of your heart. It provides your doctor with information about the size and shape of your heart and how well your heart's chambers and valves are working. This procedure takes approximately one hour. There are no restrictions for this procedure.   Your physician recommends that you return for lab work in: [place]

## 2014-02-06 NOTE — Progress Notes (Signed)
Patient ID: Katelyn [name], female   DOB: May 11, 1940, 73 y.o.   MRN: [id number]     HPI: Katelyn [name] is a 73 y.o. female who presents to the office for a 86 month cardiology evaluation  [name] has multiple problems which include CAD. She is status post cardiac catheterization in April 2012 which revealed pulmonary RCA occlusion with collaterals, 50% LAD stenosis, 30% circumflex stenosis, and 40% marginal stenosis. 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 Velta Addison. She has a history of hypertension with grade 2 diastolic dysfunction, hyperlipidemia, and has developed moderately severe aortic valve stenosis. An echo Doppler study in May 2013 showed a mean transvalvular aortic gradient of 34 with a maximum of 56. In December 2013, her mean gradient was 32 and peak 56 with a valve area of 0.84. An echo Doppler study on 12/06/2012 continued to show normal systolic function with an ejection fraction of 60-65%. She had grade 1 diastolic dysfunction. Peak gradients were  61 mm and mean gradient was 38 mm.  The aortic valve area 0.8-0.9 cm. She had mild to moderate aortic insufficiency. Had mildly thickened mitral valve leaflets with mild MR. The left atrium was mildly dilated. She had mild TR. Pulmonary pressure was mildly increased at 33 mm.  Clinically, [name] denies any change in symptom status. Specifically, there is no chest pain. She denies presyncope or syncope. She denies any increasing shortness of breath or CHF symptoms.  She has been on lisinopril 5 mg, Toprol-XL 25 mg and has not been taking furosemide but only if swelling occurs.  She admits to having a dry cough.  Since I last saw her, she was supposed to have had a follow-up echo Doppler study, but this had never been done.  She presents for followup evaluation.  She brought with her laboratory  that she had done on 09/28/2013 by Dr. Emilee Hero, including a CBC, comprehensive metabolic panel, INR, lipid panel, liver function studies, and thyroid studies.  I reviewed these in detail with her.  Past Medical History  Diagnosis Date  . Hypertension   . Claudication   . Heart murmur   . Muscle pain   . Seroma, postoperative     Left Groin  . Leg pain   . Coronary artery disease   . Stroke 1982    X's 2    Past Surgical History  Procedure Laterality Date  . Aortobifemoral bpg  06/27/10  . Abdominal hysterectomy      Partial   . Pr vein bypass graft,aorto-fem-pop  06/12/10    Allergies  Allergen Reactions  . Adhesive [Tape] Rash  . Latex Rash    Current Outpatient Prescriptions  Medication Sig Dispense Refill  . cetirizine (ZYRTEC) 10 MG tablet Take 10 mg by mouth daily.      Marland Kitchen estrogens, conjugated, (PREMARIN) 1.25 MG tablet Take 1.25 mg by mouth daily.      . FOLBIC 2.5-25-2 MG TABS TAKE ONE TABLET BY MOUTH ONCE DAILY 180 tablet 0  . FOLIC ACID PO Take by mouth daily.      . furosemide (LASIX) 20 MG tablet Take 20 mg by mouth as needed.     Marland Kitchen lisinopril (PRINIVIL,ZESTRIL) 5 MG tablet TAKE ONE TABLET BY MOUTH EVERY DAY 30 tablet 10  . meloxicam (MOBIC) 15 MG tablet Take 15 mg by mouth daily.      Marland Kitchen  metoprolol succinate (TOPROL-XL) 25 MG 24 hr tablet Take 1 tablet (25 mg total) by mouth daily. NEED APPOINTMENT FOR FUTURE REFILLS. 15 tablet 0  . potassium chloride (KLOR-CON) 10 MEQ CR tablet Take 10 mEq by mouth as needed.     . rosuvastatin (CRESTOR) 20 MG tablet Take 20 mg by mouth daily.      Marland Kitchen warfarin (COUMADIN) 3 MG tablet Take 3 mg by mouth as directed.       No current facility-administered medications for this visit.    History   Social History  . Marital Status: Married    Spouse Name: N/A    Number of Children: N/A  . Years of Education: N/A   Occupational History  . Not on file.   Social History Main Topics  . Smoking status: Former Smoker -- 0.10  packs/day    Types: Cigarettes    Quit date: 06/01/2010  . Smokeless tobacco: Never Used  . Alcohol Use: No  . Drug Use: No  . Sexual Activity: Not on file   Other Topics Concern  . Not on file   Social History Narrative    Family History  Problem Relation Age of Onset  . Heart disease Mother   . Stroke Father   . Hypertension Father   . Heart disease Father   . Arthritis Brother   . Diabetes Daughter    Social history is notable that she is married has one child. She quit tobacco in April 2012 after a long-standing tobacco history having started smoking at age 59.   ROS General: Negative; No fevers, chills, or night sweats;  HEENT: Negative; No changes in vision or hearing, sinus congestion, difficulty swallowing Pulmonary: Negative; No cough, wheezing, shortness of breath, hemoptysis Cardiovascular: See history of present illness.  Specifically, she denies palpitations, presyncope or syncope, or change in shortness of breath development GI: Negative; No nausea, vomiting, diarrhea, or abdominal pain GU: Negative; No dysuria, hematuria, or difficulty voiding Musculoskeletal: Negative; no myalgias, joint pain, or weakness Hematologic/Oncology: Negative; no easy bruising, bleeding Endocrine: Negative; no heat/cold intolerance; no diabetes Neuro: Negative; no changes in balance, headaches Skin: Negative; No rashes or skin lesions Psychiatric: Negative; No behavioral problems, depression Sleep: Negative; No snoring, daytime sleepiness, hypersomnolence, bruxism, restless legs, hypnogognic hallucinations, no cataplexy Other comprehensive 14 point system review is negative.   PE BP 150/72 mmHg  Pulse 58  Ht _0  (1.778 m)  Wt 182 lb 6.4 oz (82.736 kg)  BMI 26.17 kg/m2  General: Alert, oriented, no distress.  Skin: normal turgor, no rashes HEENT: Normocephalic, atraumatic. Pupils round and reactive; sclera anicteric;no lid lag.  Nose without nasal septal  hypertrophy Mouth/Parynx benign; Mallinpatti scale 2 Neck: No JVD, no carotid briuts Lungs: clear to ausculatation and percussion; no wheezing or rales Heart: RRR, s1 s2 normal 2/6 systolic murmur compatible with her aortic stenosis that is mid peaking. 6-8/0 diastolic murmur compatible with her aortic insufficiency. Abdomen: soft, nontender; no hepatosplenomehaly, BS+; abdominal aorta nontender and not dilated by palpation. Pulses 2+ Extremities: no clubbing cyanosis or edema, Homan's sign negative  Neurologic: grossly nonfocal Psychological: Normal affect and mood.  ECG (independently read by me): Sinus bradycardia 58 bpm.  Mild left atrial enlargement.  Left axis deviation.  Left bundle branch block with repolarization changes.  October 2014 ECG: Sinus bradycardia with left axis deviation. Left bundle branch block with repolarization changes  LABS:  BMET    Component Value Date/Time   NA 143 06/30/2010 0919  K 3.8 06/30/2010 0919   CL 112 06/30/2010 0919   CO2 25 06/30/2010 0919   GLUCOSE 134* 06/30/2010 0919   BUN 6 06/30/2010 0919   CREATININE 0.71 06/30/2010 0919   CALCIUM 7.8* 06/30/2010 0919   GFRNONAA >60 06/30/2010 0919   GFRAA  06/30/2010 0919    >60        The eGFR has been calculated using the MDRD equation. This calculation has not been validated in all clinical situations. eGFR's persistently <60 mL/min signify possible Chronic Kidney Disease.     Hepatic Function Panel     Component Value Date/Time   PROT 4.6* 06/28/2010 0338   ALBUMIN 2.4* 06/28/2010 0338   AST 33 06/28/2010 0338   ALT 18 06/28/2010 0338   ALKPHOS 46 06/28/2010 0338   BILITOT 0.3 06/28/2010 0338     CBC    Component Value Date/Time   WBC 9.0 07/01/2010 0416   RBC 3.32* 07/01/2010 0416   HGB 9.9* 07/01/2010 0416   HCT 30.3* 07/01/2010 0416   PLT 84* 07/01/2010 0416   MCV 91.3 07/01/2010 0416   MCH 29.8 07/01/2010 0416   MCHC 32.7 07/01/2010 0416   RDW 14.7 07/01/2010  0416     BNP No results found for: PROBNP  Lipid Panel  No results found for: CHOL   RADIOLOGY: No results found.    ASSESSMENT AND PLAN:  From a cardiac standpoint, Ms. Standley continues to do fairly well and remains essentially asymptomatic. She has documented coronary artery disease with mid RCA occlusion but good collateralization from her left coronary system. She also has peripheral vascular disease and is status post aortobifem graft surgery with bilateral fundoplasty.  I again reviewed her echo Doppler findings with her from over a year ago which showed a mean transvalvular aortic gradient of 38 mm with a peak and sent.  Anus gradient at 61 mm.  She had moderately severe aortic stenosis with mild to moderate aortic insufficiency.  I also reviewed carotid studies which she had had in March 2015.  This showed 50-69% diameter reduction in the right bulb/proximal ICA but velocities suggestive the lower end of scale.  There was mild amount of fibrous plaque in the left bulb without significant diameter reduction.  This had not significant change from 2013.  Her blood pressure today was elevated and she also complained of a dry somewhat hacking cough which I suspect is due to lisinopril.  For this reason, I'm electing to start her on valsartan at 80 mg.  I have recommended a follow-up BMET in 2 weeks to make certain she is tolerating this from a renal standpoint.  She's not having any edema and has not been taking Lasix daily but rarely takes 20 mg only if swelling occurs.  She continues to be on Crestor 20 mg for hyperlipidemia and her most recent lipid studies revealed a total cholesterol 127, triglycerides 145, HDL 61, and LDL cholesterol 37.  I am recommending that she undergo a follow-up echo Doppler study to reevaluate her systolic and diastolic function and to further assess potential progression of aortic valve stenosis.  I will see her back in the office in 6-8 weeks for follow up  evaluation and further recommendations will be made at that time.  Troy Sine, MD, [facility]  02/06/2014 1:06 PM

## 2014-02-12 ENCOUNTER — Ambulatory Visit: Payer: Medicare Other | Admitting: Family

## 2014-02-12 ENCOUNTER — Encounter (HOSPITAL_COMMUNITY): Payer: Medicare Other

## 2014-02-12 ENCOUNTER — Ambulatory Visit (HOSPITAL_COMMUNITY)
Admission: RE | Admit: 2014-02-12 | Discharge: 2014-02-12 | Disposition: A | Discharge status: Home / Self Care | Payer: Medicare Other | Source: Ambulatory Visit | Attending: Cardiology | Admitting: Cardiology

## 2014-02-12 DIAGNOSIS — I10 Essential (primary) hypertension: Secondary | ICD-10-CM | POA: Insufficient documentation

## 2014-02-12 DIAGNOSIS — I2510 Atherosclerotic heart disease of native coronary artery without angina pectoris: Secondary | ICD-10-CM | POA: Insufficient documentation

## 2014-02-12 DIAGNOSIS — I359 Nonrheumatic aortic valve disorder, unspecified: Secondary | ICD-10-CM

## 2014-02-12 DIAGNOSIS — R011 Cardiac murmur, unspecified: Secondary | ICD-10-CM | POA: Diagnosis not present

## 2014-02-12 DIAGNOSIS — I739 Peripheral vascular disease, unspecified: Secondary | ICD-10-CM | POA: Insufficient documentation

## 2014-02-12 DIAGNOSIS — I358 Other nonrheumatic aortic valve disorders: Secondary | ICD-10-CM | POA: Diagnosis present

## 2014-02-12 NOTE — Progress Notes (Signed)
2D Echo Performed 02/12/2014    Marygrace Drought, RCS

## 2014-02-13 ENCOUNTER — Encounter: Payer: Self-pay | Admitting: Cardiovascular Disease

## 2014-02-13 LAB — BASIC METABOLIC PANEL WITH GFR
BUN: 14 mg/dL (ref 6–23)
CALCIUM: 8.5 mg/dL (ref 8.4–10.5)
CHLORIDE: 105 meq/L (ref 96–112)
CO2: 26 meq/L (ref 19–32)
Creat: 0.83 mg/dL (ref 0.50–1.10)
GFR, Est African American: 82 mL/min
GFR, Est Non African American: 71 mL/min
Glucose, Bld: 85 mg/dL (ref 70–99)
POTASSIUM: 4.7 meq/L (ref 3.5–5.3)
SODIUM: 141 meq/L (ref 135–145)

## 2014-02-19 ENCOUNTER — Encounter: Payer: Self-pay | Admitting: Family

## 2014-02-20 ENCOUNTER — Other Ambulatory Visit: Payer: Self-pay | Admitting: Family

## 2014-02-20 ENCOUNTER — Ambulatory Visit (HOSPITAL_COMMUNITY)
Admission: RE | Admit: 2014-02-20 | Discharge: 2014-02-20 | Disposition: A | Discharge status: Home / Self Care | Payer: Medicare Other | Source: Ambulatory Visit | Attending: Family | Admitting: Family

## 2014-02-20 ENCOUNTER — Encounter: Payer: Self-pay | Admitting: Family

## 2014-02-20 ENCOUNTER — Ambulatory Visit (INDEPENDENT_AMBULATORY_CARE_PROVIDER_SITE_OTHER): Payer: Medicare Other | Admitting: Family

## 2014-02-20 VITALS — BP 145/72 | HR 60 | Resp 16 | Ht 69.5 in | Wt 183.0 lb

## 2014-02-20 DIAGNOSIS — I739 Peripheral vascular disease, unspecified: Secondary | ICD-10-CM | POA: Diagnosis present

## 2014-02-20 DIAGNOSIS — Z48812 Encounter for surgical aftercare following surgery on the circulatory system: Secondary | ICD-10-CM

## 2014-02-20 DIAGNOSIS — I70219 Atherosclerosis of native arteries of extremities with intermittent claudication, unspecified extremity: Secondary | ICD-10-CM

## 2014-02-20 DIAGNOSIS — I6529 Occlusion and stenosis of unspecified carotid artery: Secondary | ICD-10-CM

## 2014-02-20 DIAGNOSIS — Z8679 Personal history of other diseases of the circulatory system: Secondary | ICD-10-CM

## 2014-02-20 NOTE — Progress Notes (Signed)
[facility] [place] HISTORY AND PHYSICAL -PAD  History of Present Illness Katelyn [name] is a 73 y.o. female patient of Dr.Brabham who is status post aortobifemoral bypass graft in April of 2012. This was done for bilateral claudication. Postoperatively, she has had issues with swelling.  She no longer has swelling in her lower legs since she stopped amlodipine, but she still wears compression stockings. She still has numbness from the left groin to her toes as constant and mild to moderate She does have a history of back issues. Dr. Vertell Limber performed injections without benefit to her numbness.  She denies claudication symptoms, denies non-healing wounds. She had 2 strokes in 1982, states Dr. Georgina Peer, cardiologist, has been monitoring her carotid arteries, she states that she has a known heart murmur. Patient states dramatic improvement in ability to walk without claudication symptoms since the above surgery; prior to the aortobifemoral bypass graft she would get cludication symptoms in both thighs and calves after 5 steps, and her toes were turning black.  Now her toes are pink and warm.  Patient denies New Medical or Surgical History.  Pt Diabetic: No Pt smoker: former smoker, quit in 2012  Pt meds include: Statin :Yes Betablocker: Yes ASA: no Other anticoagulants/antiplatelets: takes coumadin for ICA stenosis and since she had strokes, prescribed by Dr. Leighton Roach.     Past Medical History  Diagnosis Date  . Hypertension   . Claudication   . Heart murmur   . Muscle pain   . Seroma, postoperative     Left Groin  . Leg pain   . Coronary artery disease   . 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 Peripheral vascular disease     Social History History  Substance Use Topics  . Smoking status: Former Smoker -- 0.10 packs/day    Types: Cigarettes    Quit date: 06/01/2010  . Smokeless tobacco: Never Used  . Alcohol Use: No    Family History Family History  Problem  Relation Age of Onset  . Heart disease Mother   . Varicose Veins Mother   . Stroke Father   . Hypertension Father   . Heart disease Father     Aneurysm  . Heart attack Father   . Arthritis Brother   . Diabetes Daughter     Past Surgical History  Procedure Laterality Date  . Aortobifemoral bpg  06/27/10  . Abdominal hysterectomy      Partial   . Pr vein bypass graft,aorto-fem-pop  06/12/10    Allergies  Allergen Reactions  . Adhesive [Tape] Rash  . Latex Rash    Current Outpatient Prescriptions  Medication Sig Dispense Refill  . cetirizine (ZYRTEC) 10 MG tablet Take 10 mg by mouth daily.      Marland Kitchen estrogens, conjugated, (PREMARIN) 1.25 MG tablet Take 1.25 mg by mouth daily.      . FOLBIC 2.5-25-2 MG TABS TAKE ONE TABLET BY MOUTH ONCE DAILY 180 tablet 0  . furosemide (LASIX) 20 MG tablet Take 20 mg by mouth as needed.     . meloxicam (MOBIC) 15 MG tablet Take 15 mg by mouth daily.      . metoprolol succinate (TOPROL-XL) 25 MG 24 hr tablet Take 1 tablet (25 mg total) by mouth daily. NEED APPOINTMENT FOR FUTURE REFILLS. 15 tablet 0  . potassium chloride (KLOR-CON) 10 MEQ CR tablet Take 10 mEq by mouth as needed.     . rosuvastatin (CRESTOR) 20 MG tablet Take 20 mg  by mouth daily.      . valsartan (DIOVAN) 80 MG tablet Take 1 tablet (80 mg total) by mouth daily. 30 tablet 12  . warfarin (COUMADIN) 3 MG tablet Take 3 mg by mouth as directed.      Marland Kitchen FOLIC ACID PO Take by mouth daily.       No current facility-administered medications for this visit.    ROS: See HPI for pertinent positives and negatives.   Physical Examination  Filed Vitals:   02/20/14 1520  BP: 145/72  Pulse: 60  Resp: 16  Height: 5' 9.5" (1.765 m)  Weight: 183 lb (83.008 kg)  SpO2: 98%   Body mass index is 26.65 kg/(m^2).  General: A&O x 3, WDWN,  Gait: normal Eyes: PERRLA, Pulmonary: CTAB, without wheezes, no rales or rhonchi. Cardiac: regular Rythm , with murmur     Carotid Bruits  Left Right   Transmitted cardiac murmur Transmitted cardiac murmur  Aorta: is not palpable. Radial pulses: 2+ and equal   VASCULAR EXAM: Extremities without ischemic changes  without Gangrene; without open wounds.     LE Pulses LEFT RIGHT   FEMORAL  not palpable  palpable    POPLITEAL not palpable  not palpable   POSTERIOR TIBIAL not palpable   2+palpable    DORSALIS PEDIS  ANTERIOR TIBIAL 1+palpable  1+palpable    Abdomen: soft, NT, no palpable masses. Skin: no rashes, no ulcers. Musculoskeletal: no muscle wasting or atrophy. Neurologic: A&O X 3; Appropriate Affect ; SENSATION: normal; MOTOR FUNCTION: moving all extremities equally, motor strength 5/5 throughout. Speech is fluent/normal. CN 2-12 intact.    Non-Invasive Vascular Imaging: DATE: 02/20/2014 ABI: RIGHT 1.09, Waveforms: triphasic;  LEFT 1.04, Waveforms: tri and biphasic Previous (02/06/13) ABI's: Right: 1.12, Left: 1.09  ASSESSMENT: Katelyn [name] is a 73 y.o. female who is status post aortobifemoral bypass graft in April of 2012 and had dramatic resolution of her severe bilateral claudication symptoms since this surgery. Her ABI's today indicate no arterial occlusive disease. She has no ischemic changes in her lower extremities.  PLAN:  I discussed in depth with the patient the nature of atherosclerosis, and emphasized the importance of maximal medical management including strict control of blood pressure, blood glucose, and lipid levels, obtaining regular exercise, and continued cessation of smoking.  The patient is aware that without maximal medical management the underlying atherosclerotic disease process will progress, limiting the benefit of any interventions.  Based on the patient's  vascular studies and examination, pt will return to clinic in 1 year for ABI's.  The patient was given information about PAD including signs, symptoms, treatment, what symptoms should prompt the patient to seek immediate medical care, and risk reduction measures to take.  Clemon Chambers, RN, MSN, FNP-C [facility] Phone: [phone]  Clinic MD: Early  02/20/2014 3:29 PM

## 2014-02-20 NOTE — Patient Instructions (Signed)

## 2014-02-21 ENCOUNTER — Other Ambulatory Visit: Payer: Self-pay

## 2014-02-21 DIAGNOSIS — Z48812 Encounter for surgical aftercare following surgery on the circulatory system: Secondary | ICD-10-CM

## 2014-02-21 DIAGNOSIS — I739 Peripheral vascular disease, unspecified: Secondary | ICD-10-CM

## 2014-02-24 ENCOUNTER — Other Ambulatory Visit (HOSPITAL_COMMUNITY): Payer: Self-pay | Admitting: Cardiovascular Disease

## 2014-02-26 NOTE — Telephone Encounter (Signed)
Rx(s) sent to pharmacy electronically.  

## 2014-04-18 ENCOUNTER — Other Ambulatory Visit (HOSPITAL_COMMUNITY): Payer: Self-pay | Admitting: Cardiovascular Disease

## 2014-04-18 DIAGNOSIS — I6529 Occlusion and stenosis of unspecified carotid artery: Secondary | ICD-10-CM

## 2014-04-20 ENCOUNTER — Ambulatory Visit (INDEPENDENT_AMBULATORY_CARE_PROVIDER_SITE_OTHER): Payer: Medicare Other | Admitting: Cardiovascular Disease

## 2014-04-20 VITALS — BP 140/70 | HR 57 | Ht 70.0 in | Wt 181.5 lb

## 2014-04-20 DIAGNOSIS — I70219 Atherosclerosis of native arteries of extremities with intermittent claudication, unspecified extremity: Secondary | ICD-10-CM

## 2014-04-20 DIAGNOSIS — I350 Nonrheumatic aortic (valve) stenosis: Secondary | ICD-10-CM

## 2014-04-20 DIAGNOSIS — I10 Essential (primary) hypertension: Secondary | ICD-10-CM

## 2014-04-20 DIAGNOSIS — E785 Hyperlipidemia, unspecified: Secondary | ICD-10-CM

## 2014-04-20 NOTE — Patient Instructions (Signed)
Your physician has requested that you have an echocardiogram. Echocardiography is a painless test that uses sound waves to create images of your heart. It provides your doctor with information about the size and shape of your heart and how well your heart's chambers and valves are working. This procedure takes approximately one hour. There are no restrictions for this procedure. This will be done in July.  Your physician recommends that you schedule a follow-up appointment in: August 2016

## 2014-04-20 NOTE — Progress Notes (Signed)
Patient ID: Katelyn [name], female   DOB: 1940/06/10, 74 y.o.   MRN: [id number]     HPI: Katelyn [name] is a 74 y.o. female who presents to the office for a  Follow-up cardiology evaluation  Katelyn [name] has known CAD and  is status post cardiac catheterization in April 2012 which revealed mid RCA occlusion with collaterals, 50% LAD stenosis, 30% circumflex stenosis, and 40% marginal stenosis. 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 Trula Slade. She has a history of hypertension with grade 2 diastolic dysfunction, hyperlipidemia, and has developed moderately severe aortic valve stenosis. An echo Doppler study in May 2013 showed a mean transvalvular aortic gradient of 34 with a maximum of 56. In December 2013, her mean gradient was 32 and peak 56 with a valve area of 0.84. An echo Doppler study on 12/06/2012 continued to show normal systolic function with an ejection fraction of 60-65%. She had grade 1 diastolic dysfunction. Peak gradients were  61 mm and mean gradient was 38 mm.  The aortic valve area 0.8-0.9 cm. She had mild to moderate aortic insufficiency. Had mildly thickened mitral valve leaflets with mild MR. The left atrium was mildly dilated. She had mild TR. Pulmonary pressure was mildly increased at 33 mm.  I saw Katelyn [name] in December 2015  Had not seen her in over 67month previously., Katelyn [name] denied any change in symptom status. Specifically, no chest pain, presyncope or syncope or any increasing shortness of breath or CHF symptoms.  She has been on lisinopril 5 mg, Toprol-XL 25 mg and has not been taking furosemide but only if swelling occurs.  She admits to having a dry cough.  Since I last saw her, she was supposed to have had a follow-up echo Doppler study, but this had never been done.   when I saw her in December.  I scheduled her for a follow-up  echo Doppler study. This was completed on 02/12/2014.  Ejection fraction remained normal at 55-60%. Her aortic valve was severely calcified and stenosis was felt to be severe with a mean gradient of 42, a peak gradient of 64 which had slightly increaed from 38 and 61 mm, respectively.  She continues to claim that she is entirely asymptomatic.  She has not noticed any change in her ability to exercise.  She presents for evaluation.  Past Medical History  Diagnosis Date  . Hypertension   . Claudication   . Heart murmur   . Muscle pain   . Seroma, postoperative     Left Groin  . Leg pain   . Coronary artery disease   . 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 Peripheral vascular disease     Past Surgical History  Procedure Laterality Date  . Aortobifemoral bpg  06/27/10  . Abdominal hysterectomy      Partial   . Pr vein bypass graft,aorto-fem-pop  06/12/10    Allergies  Allergen Reactions  . Adhesive [Tape] Rash  . Latex Rash    Current Outpatient Prescriptions  Medication Sig Dispense Refill  . cetirizine (ZYRTEC) 10 MG tablet Take 10 mg by mouth daily.      .Marland Kitchenestrogens, conjugated, (PREMARIN) 1.25 MG tablet Take 1.25 mg by mouth daily.      . FOLBIC 2.5-25-2 MG TABS TAKE ONE TABLET BY MOUTH ONCE DAILY 180 tablet 0  . FOLIC ACID PO  Take by mouth daily.      . furosemide (LASIX) 20 MG tablet Take 20 mg by mouth as needed.     Marland Kitchen ipratropium (ATROVENT) 0.03 % nasal spray Place 2 sprays into both nostrils 2 (two) times daily.    . meloxicam (MOBIC) 15 MG tablet Take 15 mg by mouth daily.      . metoprolol succinate (TOPROL-XL) 25 MG 24 hr tablet Take 1 tablet (25 mg total) by mouth daily. 30 tablet 11  . potassium chloride (KLOR-CON) 10 MEQ CR tablet Take 10 mEq by mouth as needed.     . rosuvastatin (CRESTOR) 20 MG tablet Take 20 mg by mouth daily.      . valsartan (DIOVAN) 80 MG tablet Take 1 tablet (80 mg total) by mouth daily. 30 tablet 12  . warfarin (COUMADIN) 3 MG tablet Take 3 mg by mouth  as directed.       No current facility-administered medications for this visit.    History   Social History  . Marital Status: Married    Spouse Name: N/A  . Number of Children: N/A  . Years of Education: N/A   Occupational History  . Not on file.   Social History Main Topics  . Smoking status: Former Smoker -- 0.10 packs/day    Types: Cigarettes    Quit date: 06/01/2010  . Smokeless tobacco: Never Used  . Alcohol Use: No  . Drug Use: No  . Sexual Activity: Not on file   Other Topics Concern  . Not on file   Social History Narrative    Family History  Problem Relation Age of Onset  . Heart disease Mother   . Varicose Veins Mother   . Stroke Father   . Hypertension Father   . Heart disease Father     Aneurysm  . Heart attack Father   . Arthritis Brother   . Diabetes Daughter    Social history is notable that she is married has one child. She quit tobacco in April 2012 after a long-standing tobacco history having started smoking at age 7.   ROS General: Negative; No fevers, chills, or night sweats;  HEENT: Negative; No changes in vision or hearing, sinus congestion, difficulty swallowing Pulmonary: Negative; No cough, wheezing, shortness of breath, hemoptysis Cardiovascular: See history of present illness.  Specifically, she denies palpitations, presyncope or syncope, or change in shortness of breath development GI: Negative; No nausea, vomiting, diarrhea, or abdominal pain GU: Negative; No dysuria, hematuria, or difficulty voiding Musculoskeletal: Negative; no myalgias, joint pain, or weakness Hematologic/Oncology: Negative; no easy bruising, bleeding Endocrine: Negative; no heat/cold intolerance; no diabetes Neuro: Negative; no changes in balance, headaches Skin: Negative; No rashes or skin lesions Psychiatric: Negative; No behavioral problems, depression Sleep: Negative; No snoring, daytime sleepiness, hypersomnolence, bruxism, restless legs, hypnogognic  hallucinations, no cataplexy Other comprehensive 14 point system review is negative.   PE BP 140/70 mmHg  Pulse 57  Ht '5\' 10"'  (1.778 m)  Wt 181 lb 8 oz (82.328 kg)  BMI 26.04 kg/m2  General: Alert, oriented, no distress.  Skin: normal turgor, no rashes HEENT: Normocephalic, atraumatic. Pupils round and reactive; sclera anicteric;no lid lag.  Nose without nasal septal hypertrophy Mouth/Parynx benign; Mallinpatti scale 2 Neck: No JVD, no carotid briuts Lungs: clear to ausculatation and percussion; no wheezing or rales Heart: RRR, s1 s2 normal 2/6 systolic murmur compatible with her aortic stenosis that is mid peaking. 6-6/5 diastolic murmur compatible with her aortic insufficiency. Abdomen: soft, nontender;  no hepatosplenomehaly, BS+; abdominal aorta nontender and not dilated by palpation. Pulses 2+ Extremities: no clubbing cyanosis or edema, Homan's sign negative  Neurologic: grossly nonfocal Psychological: Normal affect and mood.  ECG (independently read by me):  Sinus bradycardia 57 bpm.  Left bundle branch block with repolarization changes..  No ectopy.  ECG (independently read by me): Sinus bradycardia 58 bpm.  Mild left atrial enlargement.  Left axis deviation.  Left bundle branch block with repolarization changes.  October 2014 ECG: Sinus bradycardia with left axis deviation. Left bundle branch block with repolarization changes  LABS:  BMET    Component Value Date/Time   NA 141 02/12/2014 1115   K 4.7 02/12/2014 1115   CL 105 02/12/2014 1115   CO2 26 02/12/2014 1115   GLUCOSE 85 02/12/2014 1115   BUN 14 02/12/2014 1115   CREATININE 0.83 02/12/2014 1115   CREATININE 0.71 06/30/2010 0919   CALCIUM 8.5 02/12/2014 1115   GFRNONAA 71 02/12/2014 1115   GFRNONAA >60 06/30/2010 0919   GFRAA 82 02/12/2014 1115   GFRAA  06/30/2010 0919    >60        The eGFR has been calculated using the MDRD equation. This calculation has not been validated in all  clinical situations. eGFR's persistently <60 mL/min signify possible Chronic Kidney Disease.     Hepatic Function Panel     Component Value Date/Time   PROT 4.6* 06/28/2010 0338   ALBUMIN 2.4* 06/28/2010 0338   AST 33 06/28/2010 0338   ALT 18 06/28/2010 0338   ALKPHOS 46 06/28/2010 0338   BILITOT 0.3 06/28/2010 0338     CBC    Component Value Date/Time   WBC 9.0 07/01/2010 0416   RBC 3.32* 07/01/2010 0416   HGB 9.9* 07/01/2010 0416   HCT 30.3* 07/01/2010 0416   PLT 84* 07/01/2010 0416   MCV 91.3 07/01/2010 0416   MCH 29.8 07/01/2010 0416   MCHC 32.7 07/01/2010 0416   RDW 14.7 07/01/2010 0416     BNP No results found for: PROBNP  Lipid Panel  No results found for: CHOL   RADIOLOGY: No results found.    ASSESSMENT AND PLAN:  Ms. Vezina is a 74 year old female who has documented coronary artery disease with mid RCA occlusion but good collateralization from her left coronary system. She also has peripheral vascular disease and is status post aortobifem graft surgery with bilateral fundoplasty.   And I last saw her several months ago, she was hypertensive and I elected to initiate valsartan 80 mg.  She has tolerated this well and her blood pressure today is improved.  I spent a long time with her today and reviewed with her her most recent echo Doppler data.  Compared this specifically to her prior echocardiographic evaluations.  She has slightly progressed.  Her aortic valve stenosis.  When compared to her last echo and she now is in the severe aortic stenosis range with a mean gradient of 42. I again had a long discussion with her and specifically reiterated potential symptoms associated with aortic valve stenosis.  I discussed with her is a very close follow-up since symptoms will develop.  She currently has normal systolic function.  Since her echo Doppler study was done in December, I am recommending that in July she had a follow-up echo Doppler study to reassess any  further progression of her aortic valve disease.  We discussed potential future need for aortic valve replacement surgery.  I will see her in follow-up of her  repeat echo.  If she notices any change in symptomatology between now and then she is to contact our office for further evaluation , with need for cardiac catheterization.   Time spent: 25 minutes   Troy Sine, MD, [facility]  04/20/2014 12:21 PM

## 2014-04-21 ENCOUNTER — Encounter: Payer: Self-pay | Admitting: Cardiovascular Disease

## 2014-05-03 ENCOUNTER — Ambulatory Visit (HOSPITAL_COMMUNITY)
Admission: RE | Admit: 2014-05-03 | Discharge: 2014-05-03 | Disposition: A | Discharge status: Home / Self Care | Payer: Medicare Other | Source: Ambulatory Visit | Attending: Cardiology | Admitting: Cardiology

## 2014-05-03 DIAGNOSIS — I6529 Occlusion and stenosis of unspecified carotid artery: Secondary | ICD-10-CM | POA: Insufficient documentation

## 2014-05-03 NOTE — Progress Notes (Signed)
Carotid Duplex Completed. °Brianna L Mazza,RVT °

## 2014-05-07 ENCOUNTER — Other Ambulatory Visit (HOSPITAL_COMMUNITY): Payer: Self-pay | Admitting: Cardiovascular Disease

## 2014-05-07 NOTE — Telephone Encounter (Signed)
Rx(s) sent to pharmacy electronically.  

## 2014-06-18 ENCOUNTER — Telehealth: Payer: Self-pay | Admitting: Cardiovascular Disease

## 2014-06-18 ENCOUNTER — Ambulatory Visit (HOSPITAL_COMMUNITY)
Admission: RE | Admit: 2014-06-18 | Discharge: 2014-06-18 | Disposition: A | Discharge status: Home / Self Care | Payer: Medicare Other | Source: Ambulatory Visit | Attending: Family Medicine | Admitting: Family Medicine

## 2014-06-18 ENCOUNTER — Other Ambulatory Visit (HOSPITAL_COMMUNITY): Payer: Self-pay | Admitting: Family Medicine

## 2014-06-18 DIAGNOSIS — R531 Weakness: Secondary | ICD-10-CM

## 2014-06-18 NOTE — Telephone Encounter (Signed)
Katelyn [name] called in stating that she is having some weakness in both arms and she also has vascular disease. Dr. Everette Rank is wanting her to call follow up with Dr. Claiborne Billings this week. Please f/u with the pt  Thanks

## 2014-06-18 NOTE — Telephone Encounter (Signed)
Left message for patient instructing to call if needed.

## 2014-06-21 NOTE — Telephone Encounter (Signed)
Spoke to patient. She was given appt for 5/12 w/ [place], I asked if any way I could help prior to appt, she states should be fine until then. Will close.

## 2014-07-07 ENCOUNTER — Other Ambulatory Visit (HOSPITAL_COMMUNITY): Payer: Self-pay | Admitting: Cardiovascular Disease

## 2014-07-09 NOTE — Telephone Encounter (Signed)
Rx(s) sent to pharmacy electronically.  

## 2014-07-12 ENCOUNTER — Telehealth: Payer: Self-pay | Admitting: *Deleted

## 2014-07-12 ENCOUNTER — Encounter: Payer: Self-pay | Admitting: Cardiology

## 2014-07-12 ENCOUNTER — Encounter: Payer: Self-pay | Admitting: Cardiovascular Disease

## 2014-07-12 ENCOUNTER — Ambulatory Visit (INDEPENDENT_AMBULATORY_CARE_PROVIDER_SITE_OTHER): Payer: Medicare Other | Admitting: Cardiology

## 2014-07-12 VITALS — BP 168/74 | HR 61 | Ht 70.0 in | Wt 179.9 lb

## 2014-07-12 DIAGNOSIS — I350 Nonrheumatic aortic (valve) stenosis: Secondary | ICD-10-CM | POA: Diagnosis not present

## 2014-07-12 DIAGNOSIS — I70219 Atherosclerosis of native arteries of extremities with intermittent claudication, unspecified extremity: Secondary | ICD-10-CM

## 2014-07-12 DIAGNOSIS — I10 Essential (primary) hypertension: Secondary | ICD-10-CM | POA: Diagnosis not present

## 2014-07-12 NOTE — Patient Instructions (Addendum)
Your physician has requested that you have a cardiac catheterization. Cardiac catheterization is used to diagnose and/or treat various heart conditions. Doctors may recommend this procedure for a number of different reasons. The most common reason is to evaluate chest pain. Chest pain can be a symptom of coronary artery disease (CAD), and cardiac catheterization can show whether plaque is narrowing or blocking your heart's arteries. This procedure is also used to evaluate the valves, as well as measure the blood flow and oxygen levels in different parts of your heart. For further information please visit [email or web address]. Please follow instruction sheet, as given. SCHEDULE WITH DR [place]  Your physician recommends that you return for lab work in: PRIOR TO PROCEDURE  Coronary Angiogram A coronary angiogram, also called coronary angiography, is an X-ray procedure used to look at the arteries in the heart. In this procedure, a dye (contrast dye) is injected through a long, hollow tube (catheter). The catheter is about the size of a piece of cooked spaghetti and is inserted through your groin, wrist, or arm. The dye is injected into each artery, and X-rays are then taken to show if there is a blockage in the arteries of your heart. LET [facility] CARE PROVIDER KNOW ABOUT:  Any allergies you have, including allergies to shellfish or contrast dye.   All medicines you are taking, including vitamins, herbs, eye drops, creams, and over-the-counter medicines.   Previous problems you or members of your family have had with the use of anesthetics.   Any blood disorders you have.   Previous surgeries you have had.  History of kidney problems or failure.   Other medical conditions you have. RISKS AND COMPLICATIONS  Generally, a coronary angiogram is a safe procedure. However, problems can occur and include:  Allergic reaction to the dye.  Bleeding from the access site or other  locations.  Kidney injury, especially in people with impaired kidney function.  Stroke (rare).  Heart attack (rare). BEFORE THE PROCEDURE   Do not eat or drink anything after midnight the night before the procedure or as directed by your health care provider.   Ask your health care provider about changing or stopping your regular medicines. This is especially important if you are taking diabetes medicines or blood thinners. PROCEDURE  You may be given a medicine to help you relax (sedative) before the procedure. This medicine is given through an intravenous (IV) access tube that is inserted into one of your veins.   The area where the catheter will be inserted will be washed and shaved. This is usually done in the groin but may be done in the fold of your arm (near your elbow) or in the wrist.   A medicine will be given to numb the area where the catheter will be inserted (local anesthetic).   The health care provider will insert the catheter into an artery. The catheter will be guided by using a special type of X-ray (fluoroscopy) of the blood vessel being examined.   A special dye will then be injected into the catheter, and X-rays will be taken. The dye will help to show where any narrowing or blockages are located in the heart arteries.  AFTER THE PROCEDURE   If the procedure is done through the leg, you will be kept in bed lying flat for several hours. You will be instructed to not bend or cross your legs.  The insertion site will be checked frequently.   The pulse in your  feet or wrist will be checked frequently.   Additional blood tests, X-rays, and an electrocardiogram may be done.  Document Released: 08/23/2002 Document Revised: 07/03/2013 Document Reviewed: 07/11/2012 [facility] Patient Information 2015 [place], [place]. This information is not intended to replace advice given to you by your health care provider. Make sure you discuss any questions you have with  your health care provider.

## 2014-07-12 NOTE — Progress Notes (Signed)
07/12/2014 Katelyn [name]   [birth year]  [id number]  Primary Physician Lanette Hampshire, MD Primary Cardiologist: Dr. Claiborne Billings  Reason for Visit/CC: Arm pain  HPI: The patient is a 74 y/o female followed by Dr. Claiborne Billings, who presents to clinic today for evaluation for exertional chest and bilateral arm pain. She has known CAD and is status post cardiac catheterization in April 2012 which revealed mid RCA occlusion with collaterals, 50% LAD stenosis, 30% circumflex stenosis, and 40% marginal stenosis. 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 Trula Slade. She has a history of hypertension with grade 2 diastolic dysfunction, hyperlipidemia, and has developed severe aortic valve stenosis. Her most recent 2D echo, completed on 02/12/14, demonstrated an EF of 55-60%. Her aortic valve was severely calcified and stenosis was felt to be severe with a mean gradient of 42, a peak gradient of 64 which had slightly increaed from 38 and 61 mm, respectively.AVA was 0.7 cm ^2. Mild AI was also seen. Her last OV with Dr. Claiborne Billings was 04/20/2014. At that time, she was asymptomatic denying angina, dyspnea and syncope. Plans were made to repeat a 2D echo 08/2014 to reassess aortic valve. She is scheduled to f/u with Dr. Claiborne Billings in July.  Since her last OV, she has developed symptomatic AS. Over the last month, she has developed exertional chest and bilateral arm pain. Described as tightness. Occurs while doing household chores and walking her dog. Symptoms resolve with rest. She denies resting angina. No dyspnea or syncope.    Current Outpatient Prescriptions  Medication Sig Dispense Refill  . cetirizine (ZYRTEC) 10 MG tablet Take 10 mg by mouth daily.      Marland Kitchen estrogens, conjugated, (PREMARIN) 1.25 MG tablet Take 1.25 mg by mouth daily.      Marland Kitchen FOLIC ACID PO Take by mouth daily.      .  folic acid-pyridoxine-cyancobalamin (FOLBIC) 2.5-25-2 MG TABS Take 1 tablet by mouth daily. 90 tablet 3  . furosemide (LASIX) 20 MG tablet Take 20 mg by mouth as needed.     . meloxicam (MOBIC) 15 MG tablet Take 15 mg by mouth daily.      . metoprolol succinate (TOPROL-XL) 25 MG 24 hr tablet Take 1 tablet (25 mg total) by mouth daily. 30 tablet 9  . potassium chloride (KLOR-CON) 10 MEQ CR tablet Take 10 mEq by mouth as needed.     . rosuvastatin (CRESTOR) 20 MG tablet Take 20 mg by mouth daily.      . valsartan (DIOVAN) 80 MG tablet Take 1 tablet (80 mg total) by mouth daily. 30 tablet 12  . warfarin (COUMADIN) 3 MG tablet Take 3 mg by mouth as directed.       No current facility-administered medications for this visit.    Allergies  Allergen Reactions  . Adhesive [Tape] Rash  . Latex Rash    History   Social History  . Marital Status: Married    Spouse Name: N/A  . Number of Children: N/A  . Years of Education: N/A   Occupational History  . Not on file.   Social History Main Topics  . Smoking status: Former Smoker -- 0.10 packs/day    Types: Cigarettes    Quit date: 06/01/2010  . Smokeless tobacco: Never Used  . Alcohol Use: No  . Drug Use: No  . Sexual Activity: Not on file   Other Topics  Concern  . Not on file   Social History Narrative     Review of Systems: General: negative for chills, fever, night sweats or weight changes.  Cardiovascular: positive for chest pain, negative for dyspnea on exertion, edema, orthopnea, palpitations, paroxysmal nocturnal dyspnea or shortness of breath Dermatological: negative for rash Respiratory: negative for cough or wheezing Urologic: negative for hematuria Abdominal: negative for nausea, vomiting, diarrhea, bright red blood per rectum, melena, or hematemesis Neurologic: negative for visual changes, syncope, or dizziness All other systems reviewed and are otherwise negative except as noted above.    Blood pressure 168/74,  pulse 61, height '5\' 10"'$  (1.778 m), weight 179 lb 14.4 oz (81.602 kg).  General appearance: alert, cooperative and no distress Neck: no carotid bruit and no JVD Lungs: clear to auscultation bilaterally Heart: regular rate and rhythm and 3/6 SM Extremities: no LEE Pulses: 2+ and symmetric Skin: warm and dry Neurologic: Grossly normal  EKG NSR with chronic LBBB  ASSESSMENT AND PLAN:   1. Severe Symptomatic Aortic Stenosis: severe by valve area and gradient based on most recent 2D echo. She has now developed symptoms of exertional angina. Will plan for OP LHC with Dr. Claiborne Billings to assess for progression of her CAD. Dr. Claiborne Billings will place surgical referral after cath. Patient advised to avoid exertional activities.   2. CAD: patient had moderate CAD at time of last cath in 2012. Will plan for [facility] as part of pre-surgical evaluation for AS. She may require CABG at time of AVR if severe CAD.   3. Hypertension: SBP is moderately elevated in the 160s. Patient reports white coat syndrome. Given severe AS, will not adjust meds as we need to avoid hypotension.   PLAN  Will scheduled LHC with Dr. Claiborne Billings. Will need AVR in the near future.   Shervin Cypert, BRITTAINYPA-C 07/12/2014 12:16 PM

## 2014-07-12 NOTE — Telephone Encounter (Signed)
Spoke with pt, she is scheduled for cath 07-25-14. Per kristin, pharm md and dr Claiborne Billings, patient will hold coumadin 5 days prior to the procedure and restart the day of. Patient voiced understanding of above instructions.

## 2014-07-19 ENCOUNTER — Other Ambulatory Visit: Payer: Self-pay | Admitting: *Deleted

## 2014-07-19 DIAGNOSIS — Z01818 Encounter for other preprocedural examination: Secondary | ICD-10-CM

## 2014-07-20 LAB — PROTIME-INR
INR: 2.58 — ABNORMAL HIGH (ref <1.50)
PROTHROMBIN TIME: 27.7 s — AB (ref 11.6–15.2)

## 2014-07-20 LAB — CBC
HEMATOCRIT: 44.2 % (ref 36.0–46.0)
Hemoglobin: 14.5 g/dL (ref 12.0–15.0)
MCH: 30 pg (ref 26.0–34.0)
MCHC: 32.8 g/dL (ref 30.0–36.0)
MCV: 91.5 fL (ref 78.0–100.0)
MPV: 11.8 fL (ref 8.6–12.4)
Platelets: 136 10*3/uL — ABNORMAL LOW (ref 150–400)
RBC: 4.83 MIL/uL (ref 3.87–5.11)
RDW: 14.3 % (ref 11.5–15.5)
WBC: 5.6 10*3/uL (ref 4.0–10.5)

## 2014-07-20 LAB — BASIC METABOLIC PANEL WITH GFR
BUN: 16 mg/dL (ref 6–23)
CHLORIDE: 105 meq/L (ref 96–112)
CO2: 27 mEq/L (ref 19–32)
Calcium: 8.7 mg/dL (ref 8.4–10.5)
Creat: 0.84 mg/dL (ref 0.50–1.10)
GFR, EST AFRICAN AMERICAN: 81 mL/min
GFR, EST NON AFRICAN AMERICAN: 70 mL/min
GLUCOSE: 81 mg/dL (ref 70–99)
POTASSIUM: 4.9 meq/L (ref 3.5–5.3)
Sodium: 140 mEq/L (ref 135–145)

## 2014-07-25 ENCOUNTER — Ambulatory Visit (HOSPITAL_COMMUNITY)
Admission: RE | Admit: 2014-07-25 | Discharge: 2014-07-25 | Disposition: A | Discharge status: Home / Self Care | Payer: Medicare Other | Source: Ambulatory Visit | Attending: Cardiovascular Disease | Admitting: Cardiovascular Disease

## 2014-07-25 ENCOUNTER — Encounter (HOSPITAL_COMMUNITY)
Admission: RE | Disposition: A | Discharge status: Home / Self Care | Payer: Medicare Other | Source: Ambulatory Visit | Attending: Cardiovascular Disease

## 2014-07-25 DIAGNOSIS — Z7901 Long term (current) use of anticoagulants: Secondary | ICD-10-CM | POA: Insufficient documentation

## 2014-07-25 DIAGNOSIS — E785 Hyperlipidemia, unspecified: Secondary | ICD-10-CM | POA: Diagnosis not present

## 2014-07-25 DIAGNOSIS — I352 Nonrheumatic aortic (valve) stenosis with insufficiency: Secondary | ICD-10-CM | POA: Diagnosis not present

## 2014-07-25 DIAGNOSIS — Z91040 Latex allergy status: Secondary | ICD-10-CM | POA: Diagnosis not present

## 2014-07-25 DIAGNOSIS — I350 Nonrheumatic aortic (valve) stenosis: Secondary | ICD-10-CM

## 2014-07-25 DIAGNOSIS — R079 Chest pain, unspecified: Secondary | ICD-10-CM | POA: Diagnosis present

## 2014-07-25 DIAGNOSIS — Z87891 Personal history of nicotine dependence: Secondary | ICD-10-CM | POA: Diagnosis not present

## 2014-07-25 DIAGNOSIS — I2582 Chronic total occlusion of coronary artery: Secondary | ICD-10-CM | POA: Diagnosis not present

## 2014-07-25 DIAGNOSIS — I10 Essential (primary) hypertension: Secondary | ICD-10-CM | POA: Diagnosis not present

## 2014-07-25 DIAGNOSIS — I2510 Atherosclerotic heart disease of native coronary artery without angina pectoris: Secondary | ICD-10-CM | POA: Diagnosis not present

## 2014-07-25 DIAGNOSIS — I739 Peripheral vascular disease, unspecified: Secondary | ICD-10-CM | POA: Insufficient documentation

## 2014-07-25 DIAGNOSIS — Z01818 Encounter for other preprocedural examination: Secondary | ICD-10-CM

## 2014-07-25 HISTORY — PX: CARDIAC CATHETERIZATION: SHX172

## 2014-07-25 LAB — PROTIME-INR
INR: 1.2 (ref 0.00–1.49)
Prothrombin Time: 15.4 seconds — ABNORMAL HIGH (ref 11.6–15.2)

## 2014-07-25 SURGERY — RIGHT/LEFT HEART CATH AND CORONARY ANGIOGRAPHY

## 2014-07-25 MED ORDER — SODIUM CHLORIDE 0.9 % WEIGHT BASED INFUSION: 1.0000 mL/kg/h | INTRAVENOUS | Status: DC

## 2014-07-25 MED ORDER — VERAPAMIL HCL 2.5 MG/ML IV SOLN
INTRAVENOUS | Status: DC | PRN
  Administered 2014-07-25: 09:00:00 via INTRA_ARTERIAL

## 2014-07-25 MED ORDER — MIDAZOLAM HCL 2 MG/2ML IJ SOLN
INTRAMUSCULAR | Status: DC | PRN
  Administered 2014-07-25: 1 mg via INTRAVENOUS

## 2014-07-25 MED ORDER — SODIUM CHLORIDE 0.9 % IJ SOLN: 3.0000 mL | INTRAMUSCULAR | Status: DC | PRN

## 2014-07-25 MED ORDER — SODIUM CHLORIDE 0.9 % IV SOLN: INTRAVENOUS | Status: AC

## 2014-07-25 MED ORDER — HEPARIN SODIUM (PORCINE) 1000 UNIT/ML IJ SOLN
INTRAMUSCULAR | Status: DC | PRN
  Administered 2014-07-25: 4000 [IU] via INTRAVENOUS

## 2014-07-25 MED ORDER — SODIUM CHLORIDE 0.9 % IJ SOLN: 3.0000 mL | Freq: Two times a day (BID) | INTRAMUSCULAR | Status: DC

## 2014-07-25 MED ORDER — ASPIRIN EC 81 MG PO TBEC
81.0000 mg | DELAYED_RELEASE_TABLET | Freq: Every day | ORAL | Status: DC
  Filled 2014-07-25: qty 1

## 2014-07-25 MED ORDER — SODIUM CHLORIDE 0.9 % IV SOLN: 250.0000 mL | INTRAVENOUS | Status: DC | PRN

## 2014-07-25 MED ORDER — HEPARIN SODIUM (PORCINE) 1000 UNIT/ML IJ SOLN
INTRAMUSCULAR | Status: AC
  Filled 2014-07-25: qty 1

## 2014-07-25 MED ORDER — FENTANYL CITRATE (PF) 100 MCG/2ML IJ SOLN
INTRAMUSCULAR | Status: DC | PRN
  Administered 2014-07-25: 25 ug via INTRAVENOUS

## 2014-07-25 MED ORDER — NITROGLYCERIN 1 MG/10 ML FOR IR/CATH LAB
INTRA_ARTERIAL | Status: AC
Start: 2014-07-25 — End: 2014-07-25
  Filled 2014-07-25: qty 10

## 2014-07-25 MED ORDER — LIDOCAINE HCL (PF) 1 % IJ SOLN
INTRAMUSCULAR | Status: AC
  Filled 2014-07-25: qty 30

## 2014-07-25 MED ORDER — SODIUM CHLORIDE 0.9 % WEIGHT BASED INFUSION
3.0000 mL/kg/h | INTRAVENOUS | Status: DC
  Administered 2014-07-25: 3 mL/kg/h via INTRAVENOUS

## 2014-07-25 MED ORDER — ONDANSETRON HCL 4 MG/2ML IJ SOLN: 4.0000 mg | Freq: Four times a day (QID) | INTRAMUSCULAR | Status: DC | PRN

## 2014-07-25 MED ORDER — MIDAZOLAM HCL 2 MG/2ML IJ SOLN
INTRAMUSCULAR | Status: AC
  Filled 2014-07-25: qty 2

## 2014-07-25 MED ORDER — HEPARIN (PORCINE) IN NACL 2-0.9 UNIT/ML-% IJ SOLN
INTRAMUSCULAR | Status: AC
  Filled 2014-07-25: qty 1500

## 2014-07-25 MED ORDER — ACETAMINOPHEN 325 MG PO TABS: 650.0000 mg | ORAL_TABLET | ORAL | Status: DC | PRN

## 2014-07-25 MED ORDER — VERAPAMIL HCL 2.5 MG/ML IV SOLN
INTRAVENOUS | Status: AC
  Filled 2014-07-25: qty 2

## 2014-07-25 MED ORDER — IOHEXOL 350 MG/ML SOLN
INTRAVENOUS | Status: DC | PRN
  Administered 2014-07-25: 50 mL via INTRA_ARTERIAL
  Administered 2014-07-25: 100 mL via INTRA_ARTERIAL

## 2014-07-25 MED ORDER — FENTANYL CITRATE (PF) 100 MCG/2ML IJ SOLN
INTRAMUSCULAR | Status: AC
  Filled 2014-07-25: qty 2

## 2014-07-25 SURGICAL SUPPLY — 18 items
CATH INFINITI 5FR ANG PIGTAIL (CATHETERS) ×3 IMPLANT
CATH INFINITI 5FR MULTPACK ANG (CATHETERS) IMPLANT
CATH INFINITI JR4 5F (CATHETERS) ×2 IMPLANT
CATH OPTITORQUE TIG 4.0 5F (CATHETERS) ×3 IMPLANT
CATH SWAN GANZ 7F STRAIGHT (CATHETERS) ×3 IMPLANT
DEVICE RAD COMP TR BAND LRG (VASCULAR PRODUCTS) ×3 IMPLANT
GLIDESHEATH SLEND A-KIT 6F 22G (SHEATH) ×3 IMPLANT
KIT HEART LEFT (KITS) ×3 IMPLANT
PACK CARDIAC CATHETERIZATION (CUSTOM PROCEDURE TRAY) ×3 IMPLANT
SHEATH PINNACLE 5F 10CM (SHEATH) IMPLANT
SHEATH PINNACLE 7F 10CM (SHEATH) ×3 IMPLANT
SYR MEDRAD MARK V 150ML (SYRINGE) ×3 IMPLANT
TRANSDUCER W/STOPCOCK (MISCELLANEOUS) ×6 IMPLANT
TUBING CIL FLEX 10 FLL-RA (TUBING) ×3 IMPLANT
WIRE EMERALD 3MM-J .035X150CM (WIRE) IMPLANT
WIRE EMERALD 3MM-J .035X260CM (WIRE) ×3 IMPLANT
WIRE EMERALD ST .035X260CM (WIRE) ×2 IMPLANT
WIRE SAFE-T 1.5MM-J .035X260CM (WIRE) ×3 IMPLANT

## 2014-07-25 NOTE — Discharge Instructions (Signed)
Radial Site Care Refer to this sheet in the next few weeks. These instructions provide you with information on caring for yourself after your procedure. Your caregiver may also give you more specific instructions. Your treatment has been planned according to current medical practices, but problems sometimes occur. Call your caregiver if you have any problems or questions after your procedure. HOME CARE INSTRUCTIONS  You may shower the day after the procedure.Remove the bandage (dressing) and gently wash the site with plain soap and water.Gently pat the site dry.  Do not apply powder or lotion to the site.  Do not submerge the affected site in water for 3 to 5 days.  Inspect the site at least twice daily.  Do not flex or bend the affected arm for 24 hours.  No lifting over 5 pounds (2.3 kg) for 5 days after your procedure. Do not drive home if you are discharged the same day of the procedure. Have someone else drive you.Venogram, Care After Refer to this sheet in the next few weeks. These instructions provide you with information on caring for yourself after your procedure. Your health care provider may also give you more specific instructions. Your treatment has been planned according to current medical practices, but problems sometimes occur. Call your health care provider if you have any problems or questions after your procedure. WHAT TO EXPECT AFTER THE PROCEDURE After your procedure, it is typical to have the following sensations: Mild discomfort at the catheter insertion site. HOME CARE INSTRUCTIONS  Take all medicines exactly as directed. Follow any prescribed diet. Follow instructions regarding both rest and physical activity. Drink more fluids for the first several days after the procedure in order to help flush dye from your kidneys. SEEK MEDICAL CARE IF: You develop a rash. You have fever not controlled by medicine. SEEK IMMEDIATE MEDICAL CARE IF: There is pain, drainage,  bleeding, redness, swelling, warmth or a red streak at the site of the IV tube. The extremity where your IV tube was placed becomes discolored, numb, or cool. You have difficulty breathing or shortness of breath. You develop chest pain. You have excessive dizziness or fainting. Document Released: 12/07/2012 Document Revised: 02/21/2013 Document Reviewed: 12/07/2012 [facility] Patient Information 2015 [place], [place]. This information is not intended to replace advice given to you by your health care provider. Make sure you discuss any questions you have with your health care provider.    You may drive 24 hours after the procedure unless otherwise instructed by your caregiver.  Do not operate machinery or power tools for 24 hours.  A responsible adult should be with you for the first 24 hours after you arrive home. What to expect:  Any bruising will usually fade within 1 to 2 weeks.  Blood that collects in the tissue (hematoma) may be painful to the touch. It should usually decrease in size and tenderness within 1 to 2 weeks. SEEK IMMEDIATE MEDICAL CARE IF:  You have unusual pain at the radial site.  You have redness, warmth, swelling, or pain at the radial site.  You have drainage (other than a small amount of blood on the dressing).  You have chills.  You have a fever or persistent symptoms for more than 72 hours.  You have a fever and your symptoms suddenly get worse.  Your arm becomes pale, cool, tingly, or numb.  You have heavy bleeding from the site. Hold pressure on the site. Document Released: 03/21/2010 Document Revised: 05/11/2011 Document Reviewed: 03/21/2010 [employer] Patient Information  2015 [employer]. This information is not intended to replace advice given to you by your health care provider. Make sure you discuss any questions you have with your health care provider.

## 2014-07-25 NOTE — H&P (View-Only) (Signed)
07/12/2014 Katelyn [name]   [birth year]  [id number]  Primary Physician Lanette Hampshire, MD Primary Cardiologist: Dr. Claiborne Billings  Reason for Visit/CC: Arm pain  HPI: The patient is a 74 y/o female followed by Dr. Claiborne Billings, who presents to clinic today for evaluation for exertional chest and bilateral arm pain. She has known CAD and is status post cardiac catheterization in April 2012 which revealed mid RCA occlusion with collaterals, 50% LAD stenosis, 30% circumflex stenosis, and 40% marginal stenosis. 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 Trula Slade. She has a history of hypertension with grade 2 diastolic dysfunction, hyperlipidemia, and has developed severe aortic valve stenosis. Her most recent 2D echo, completed on 02/12/14, demonstrated an EF of 55-60%. Her aortic valve was severely calcified and stenosis was felt to be severe with a mean gradient of 42, a peak gradient of 64 which had slightly increaed from 38 and 61 mm, respectively.AVA was 0.7 cm ^2. Mild AI was also seen. Her last OV with Dr. Claiborne Billings was 04/20/2014. At that time, she was asymptomatic denying angina, dyspnea and syncope. Plans were made to repeat a 2D echo 08/2014 to reassess aortic valve. She is scheduled to f/u with Dr. Claiborne Billings in July.  Since her last OV, she has developed symptomatic AS. Over the last month, she has developed exertional chest and bilateral arm pain. Described as tightness. Occurs while doing household chores and walking her dog. Symptoms resolve with rest. She denies resting angina. No dyspnea or syncope.    Current Outpatient Prescriptions  Medication Sig Dispense Refill  . cetirizine (ZYRTEC) 10 MG tablet Take 10 mg by mouth daily.      Marland Kitchen estrogens, conjugated, (PREMARIN) 1.25 MG tablet Take 1.25 mg by mouth daily.      Marland Kitchen FOLIC ACID PO Take by mouth daily.      .  folic acid-pyridoxine-cyancobalamin (FOLBIC) 2.5-25-2 MG TABS Take 1 tablet by mouth daily. 90 tablet 3  . furosemide (LASIX) 20 MG tablet Take 20 mg by mouth as needed.     . meloxicam (MOBIC) 15 MG tablet Take 15 mg by mouth daily.      . metoprolol succinate (TOPROL-XL) 25 MG 24 hr tablet Take 1 tablet (25 mg total) by mouth daily. 30 tablet 9  . potassium chloride (KLOR-CON) 10 MEQ CR tablet Take 10 mEq by mouth as needed.     . rosuvastatin (CRESTOR) 20 MG tablet Take 20 mg by mouth daily.      . valsartan (DIOVAN) 80 MG tablet Take 1 tablet (80 mg total) by mouth daily. 30 tablet 12  . warfarin (COUMADIN) 3 MG tablet Take 3 mg by mouth as directed.       No current facility-administered medications for this visit.    Allergies  Allergen Reactions  . Adhesive [Tape] Rash  . Latex Rash    History   Social History  . Marital Status: Married    Spouse Name: N/A  . Number of Children: N/A  . Years of Education: N/A   Occupational History  . Not on file.   Social History Main Topics  . Smoking status: Former Smoker -- 0.10 packs/day    Types: Cigarettes    Quit date: 06/01/2010  . Smokeless tobacco: Never Used  . Alcohol Use: No  . Drug Use: No  . Sexual Activity: Not on file   Other Topics  Concern  . Not on file   Social History Narrative     Review of Systems: General: negative for chills, fever, night sweats or weight changes.  Cardiovascular: positive for chest pain, negative for dyspnea on exertion, edema, orthopnea, palpitations, paroxysmal nocturnal dyspnea or shortness of breath Dermatological: negative for rash Respiratory: negative for cough or wheezing Urologic: negative for hematuria Abdominal: negative for nausea, vomiting, diarrhea, bright red blood per rectum, melena, or hematemesis Neurologic: negative for visual changes, syncope, or dizziness All other systems reviewed and are otherwise negative except as noted above.    Blood pressure 168/74,  pulse 61, height '5\' 10"'$  (1.778 m), weight 179 lb 14.4 oz (81.602 kg).  General appearance: alert, cooperative and no distress Neck: no carotid bruit and no JVD Lungs: clear to auscultation bilaterally Heart: regular rate and rhythm and 3/6 SM Extremities: no LEE Pulses: 2+ and symmetric Skin: warm and dry Neurologic: Grossly normal  EKG NSR with chronic LBBB  ASSESSMENT AND PLAN:   1. Severe Symptomatic Aortic Stenosis: severe by valve area and gradient based on most recent 2D echo. She has now developed symptoms of exertional angina. Will plan for OP LHC with Dr. Claiborne Billings to assess for progression of her CAD. Dr. Claiborne Billings will place surgical referral after cath. Patient advised to avoid exertional activities.   2. CAD: patient had moderate CAD at time of last cath in 2012. Will plan for [facility] as part of pre-surgical evaluation for AS. She may require CABG at time of AVR if severe CAD.   3. Hypertension: SBP is moderately elevated in the 160s. Patient reports white coat syndrome. Given severe AS, will not adjust meds as we need to avoid hypotension.   PLAN  Will scheduled LHC with Dr. Claiborne Billings. Will need AVR in the near future.   Avett Reineck, BRITTAINYPA-C 07/12/2014 12:16 PM

## 2014-07-25 NOTE — Progress Notes (Signed)
Site area: rt groin fv sheath   Site Prior to Removal:  Level 0 Pressure Applied For:  10 minutes Manual:   yes Patient Status During Pull:  stable Post Pull Site:  Level  0 Post Pull Instructions Given:  yes Post Pull Pulses Present: yes Dressing Applied:  tegaderm  Bedrest begins @  1105

## 2014-07-25 NOTE — Interval H&P Note (Signed)
History and Physical Interval Note:  07/25/2014 8:39 AM  Katelyn [name]  has presented today for surgery, with the diagnosis of arotic stenosis  The various methods of treatment have been discussed with the patient and family. After consideration of risks, benefits and other options for treatment, the patient has consented to  Procedure(s):  Right and Left Heart Cath and Coronary Angiography (N/A) as a surgical intervention .  The patient's history has been reviewed, patient examined, no change in status, stable for surgery.  I have reviewed the patient's chart and labs.  Questions were answered to the patient's satisfaction.     [name]

## 2014-07-26 ENCOUNTER — Encounter (HOSPITAL_COMMUNITY): Payer: Self-pay | Admitting: Cardiovascular Disease

## 2014-07-26 LAB — POCT I-STAT 3, VENOUS BLOOD GAS (G3P V)
Acid-base deficit: 3 mmol/L — ABNORMAL HIGH (ref 0.0–2.0)
Bicarbonate: 23.8 mEq/L (ref 20.0–24.0)
O2 SAT: 59 %
PH VEN: 7.3 (ref 7.250–7.300)
TCO2: 25 mmol/L (ref 0–100)
pCO2, Ven: 48.3 mmHg (ref 45.0–50.0)
pO2, Ven: 34 mmHg (ref 30.0–45.0)

## 2014-07-26 LAB — POCT I-STAT 3, ART BLOOD GAS (G3+)
Acid-base deficit: 4 mmol/L — ABNORMAL HIGH (ref 0.0–2.0)
Bicarbonate: 21.5 mEq/L (ref 20.0–24.0)
O2 SAT: 92 %
TCO2: 23 mmol/L (ref 0–100)
pCO2 arterial: 41.4 mmHg (ref 35.0–45.0)
pH, Arterial: 7.324 — ABNORMAL LOW (ref 7.350–7.450)
pO2, Arterial: 68 mmHg — ABNORMAL LOW (ref 80.0–100.0)

## 2014-07-26 LAB — POCT ACTIVATED CLOTTING TIME: Activated Clotting Time: 147 seconds

## 2014-07-26 MED FILL — Lidocaine HCl Local Preservative Free (PF) Inj 1%: INTRAMUSCULAR | Qty: 30 | Status: AC

## 2014-07-26 MED FILL — Heparin Sodium (Porcine) 2 Unit/ML in Sodium Chloride 0.9%: INTRAMUSCULAR | Qty: 1500 | Status: AC

## 2014-08-01 ENCOUNTER — Institutional Professional Consult (permissible substitution) (INDEPENDENT_AMBULATORY_CARE_PROVIDER_SITE_OTHER): Payer: Medicare Other | Admitting: Surgery

## 2014-08-01 ENCOUNTER — Encounter: Payer: Self-pay | Admitting: Surgery

## 2014-08-01 VITALS — BP 166/82 | HR 60 | Resp 20 | Ht 70.0 in | Wt 179.0 lb

## 2014-08-01 DIAGNOSIS — I2510 Atherosclerotic heart disease of native coronary artery without angina pectoris: Secondary | ICD-10-CM | POA: Diagnosis not present

## 2014-08-01 DIAGNOSIS — I350 Nonrheumatic aortic (valve) stenosis: Secondary | ICD-10-CM | POA: Diagnosis not present

## 2014-08-02 ENCOUNTER — Encounter: Payer: Self-pay | Admitting: Surgery

## 2014-08-02 ENCOUNTER — Other Ambulatory Visit: Payer: Self-pay | Admitting: *Deleted

## 2014-08-02 DIAGNOSIS — I350 Nonrheumatic aortic (valve) stenosis: Secondary | ICD-10-CM

## 2014-08-02 NOTE — Progress Notes (Signed)
Cardiothoracic Surgery Consultation   PCP is Lanette Hampshire, MD Referring Provider is Troy Sine, MD  Chief Complaint  Patient presents with  . Aortic Stenosis    Surgical eval for CABG and AVR, Cardiac Cath 07/25/14, 2D ECHO 02/12/14  . Coronary Artery Disease    HPI:  The patient is a 74 year old former smoker with HTN, HLD, known CAD s/p cath in 06/2010 showing mid RCA occlusion with collaterals, 50% LAD stenosis, 30% circumflex stenosis, and 40% marginal stenosis. She has known severe aortic stenosis by echo in 01/2014 with a mean gradient of 42 mm Hg and grade 2 diastolic dysfunction. 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 Trula Slade in 06/2010 for impending gangrene of her feet. Her severe aortic stenosis was apparently followed medically since she was asymptomatic. Over the last month she has developed exertional chest discomfort radiating into both arms that occurs with doing work around the house and walking her dog. She has shortness of breath and fatigue associated with this. She has had no symptoms at rest, no dizziness or syncope. She underwent cardiac cath on 07/25/2014 showing the chronically occluded RCA with collaterals to the distal vessel. There is a 70% ostial D1 stenosis, 50% ostial LCX stenosis, 40% AV groove LCX stenosis. There is 1-2+ AI, 2+ MR and severe AS with a peak to peak gradient of 46 mm Hg and a mean gradient of 39.5 mm HG. LVEF is 55%. It can also be seen on fluoroscopy that there is extensive diffuse calcification of the entire ascending aorta and arch. Past Medical History  Diagnosis Date  . Hypertension   . Claudication   . Heart murmur   . Muscle pain   . Seroma, postoperative     Left Groin  . Leg pain   . Coronary artery disease   . 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 Peripheral vascular disease     Past  Surgical History  Procedure Laterality Date  . Aortobifemoral bpg  06/27/10  . Abdominal hysterectomy      Partial   . Pr vein bypass graft,aorto-fem-pop  06/12/10  . Cardiac catheterization N/A 07/25/2014    Procedure: Right/Left Heart Cath and Coronary Angiography;  Surgeon: Troy Sine, MD;  Location: [facility];  Service: Cardiovascular;  Laterality: N/A;    Family History  Problem Relation Age of Onset  . Heart disease Mother   . Varicose Veins Mother   . Stroke Father   . Hypertension Father   . Heart disease Father     Aneurysm  . Heart attack Father   . Arthritis Brother   . Diabetes Daughter     Social History History  Substance Use Topics  . Smoking status: Former Smoker -- 0.10 packs/day    Types: Cigarettes    Quit date: 06/01/2010  . Smokeless tobacco: Never Used  . Alcohol Use: No    Current Outpatient Prescriptions  Medication Sig Dispense Refill  . cetirizine (ZYRTEC) 10 MG tablet Take 10 mg by mouth daily.      Marland Kitchen estrogens, conjugated, (PREMARIN) 1.25 MG tablet Take 1.25 mg by mouth daily.      Marland Kitchen FOLIC ACID PO Take by mouth daily.      . folic acid-pyridoxine-cyancobalamin (FOLBIC) 2.5-25-2 MG TABS Take 1 tablet by mouth daily. 90 tablet 3  . furosemide (LASIX) 20  MG tablet Take 20 mg by mouth as needed.     . meloxicam (MOBIC) 15 MG tablet Take 15 mg by mouth daily.      . metoprolol succinate (TOPROL-XL) 25 MG 24 hr tablet Take 1 tablet (25 mg total) by mouth daily. 30 tablet 9  . potassium chloride (KLOR-CON) 10 MEQ CR tablet Take 10 mEq by mouth as needed.     . rosuvastatin (CRESTOR) 20 MG tablet Take 20 mg by mouth daily.      . valsartan (DIOVAN) 80 MG tablet Take 1 tablet (80 mg total) by mouth daily. 30 tablet 12  . warfarin (COUMADIN) 3 MG tablet Take 3 mg by mouth as directed.       No current facility-administered medications for this visit.    Allergies  Allergen Reactions  . Adhesive [Tape] Rash  . Latex Rash    Review of  Systems  Constitutional: Positive for activity change and fatigue. Negative for fever, chills and unexpected weight change.  HENT: Negative.   Eyes: Negative.   Respiratory: Positive for shortness of breath.   Cardiovascular: Negative for palpitations and leg swelling.       Chest tightness radiating into arms with exertion  Gastrointestinal: Negative.   Endocrine: Negative.   Genitourinary: Negative.   Musculoskeletal: Negative.   Skin: Negative.   Allergic/Immunologic: Negative.   Neurological: Negative.   Hematological: Negative.   Psychiatric/Behavioral: Negative.     BP 166/82 mmHg  Pulse 60  Resp 20  Ht '5\' 10"'$  (1.778 m)  Wt 179 lb (81.194 kg)  BMI 25.68 kg/m2  SpO2 96% Physical Exam  Constitutional: She is oriented to person, place, and time. She appears well-developed and well-nourished. No distress.  HENT:  Head: Normocephalic and atraumatic.  Mouth/Throat: Oropharynx is clear and moist.  Eyes: EOM are normal. Pupils are equal, round, and reactive to light.  Neck: Neck supple. No JVD present. No thyromegaly present.  Cardiovascular: Normal rate and regular rhythm.   Murmur heard. 3/6 harsh systolic murmur along RSB. 2/6 diastolic murmur along RSB radiating to apex.  Palpable femoral pulses   Pulmonary/Chest: Effort normal and breath sounds normal. No respiratory distress. She has no wheezes. She has no rales.  Abdominal: Soft. Bowel sounds are normal. She exhibits no distension and no mass. There is no tenderness.  Musculoskeletal: Normal range of motion. She exhibits no edema.  Lymphadenopathy:    She has no cervical adenopathy.  Neurological: She is alert and oriented to person, place, and time. She has normal strength. No cranial nerve deficit or sensory deficit.  Skin: Skin is warm and dry.  Psychiatric: She has a normal mood and affect.     Diagnostic Tests:  *[facility] [place], [place], [place] [address]              [phone]  ------------------------------------------------------------------- Transthoracic Echocardiography  Patient:  Katelyn [name], Katelyn [name] MR #:    [id number] Study Date: 02/12/2014 Gender:   F Age:    39 Height:   177.8 cm Weight:   82.6 kg BSA:    2.03 m^2 Pt. Status: Room:  ORDERING   Shelva Majestic, M.D. REFERRING  Shelva Majestic, M.D. ATTENDING  Kirk Ruths SONOGRAPHER Marygrace Drought, RCS PERFORMING  [facility], Outpatient  cc:  ------------------------------------------------------------------- LV EF: 55% -  60%  ------------------------------------------------------------------- Indications:   424.1 Aortic valve  disorders.  ------------------------------------------------------------------- History:  PMH: Claudication. Murmur. Coronary artery disease. Risk factors: Hypertension.  ------------------------------------------------------------------- Study Conclusions  - Left ventricle: The cavity size was normal. Wall thickness was increased in a pattern of moderate LVH. Systolic function was normal. The estimated ejection fraction was in the range of 55% to 60%. Features are consistent with a pseudonormal left ventricular filling pattern, with concomitant abnormal relaxation and increased filling pressure (grade 2 diastolic dysfunction). E/medial e&' > 15, suggesting LV end diastolic pressure at least 20 mmHg. - Aortic valve: Trileaflet; severely calcified leaflets. There was severe stenosis. There was mild regurgitation. Mean gradient (S): 42 mm Hg. Peak gradient (S): 64 mm Hg. Valve area (VTI): 0.7 cm^2. - Mitral valve: Moderately calcified annulus. Mildly calcified leaflets . There was trivial regurgitation. - Left atrium: The atrium was mildly dilated. - Right ventricle: The cavity size was normal. Systolic function was normal. -  Tricuspid valve: Peak RV-RA gradient (S): 41 mm Hg. - Pulmonary arteries: PA peak pressure: 44 mm Hg (S). - Inferior vena cava: The vessel was normal in size. The respirophasic diameter changes were in the normal range (= 50%), consistent with normal central venous pressure.  Impressions:  - Normal LV size with moderate LV hypertrophy, EF 55-60%. Moderate diastolic dysfunction with evidence for elevated LV filling pressure. Normal RV size and systolic function. Severe aortic stenosis with mild aortic insufficiency. Trileaflet valve. Mild pulmonary hypertension.  Transthoracic echocardiography. M-mode, complete 2D, spectral Doppler, and color Doppler. Birthdate: Patient birthdate: [birth year]. Age: Patient is 73 yr old. Sex: Gender: female. BMI: 26.1 kg/m^2. Blood pressure:   171/71 Patient status: Outpatient. Study date: Study date: 02/12/2014. Study time: 10:15 AM. Location: Echo laboratory.  -------------------------------------------------------------------  ------------------------------------------------------------------- Left ventricle: The cavity size was normal. Wall thickness was increased in a pattern of moderate LVH. Systolic function was normal. The estimated ejection fraction was in the range of 55% to 60%. Features are consistent with a pseudonormal left ventricular filling pattern, with concomitant abnormal relaxation and increased filling pressure (grade 2 diastolic dysfunction). E/medial e&' > 15, suggesting LV end diastolic pressure at least 20 mmHg.  ------------------------------------------------------------------- Aortic valve:  Trileaflet; severely calcified leaflets. Doppler: There was severe stenosis.  There was mild regurgitation.  VTI ratio of LVOT to aortic valve: 0.2. Valve area (VTI): 0.7 cm^2. Indexed valve area (VTI): 0.34 cm^2/m^2. Indexed valve area (Vmax): 0.37 cm^2/m^2. Mean velocity ratio of LVOT to aortic  valve: 0.21. Indexed valve area (Vmean): 0.35 cm^2/m^2.  Mean gradient (S): 42 mm Hg. Peak gradient (S): 64 mm Hg.  ------------------------------------------------------------------- Aorta: Aortic root: The aortic root was normal in size. Ascending aorta: The ascending aorta was normal in size.  ------------------------------------------------------------------- Mitral valve:  Moderately calcified annulus. Mildly calcified leaflets . Doppler:  There was no evidence for stenosis.  There was trivial regurgitation.  Peak gradient (D): 7 mm Hg.  ------------------------------------------------------------------- Left atrium: LA volume/ BSA = 32.3 ml/m2. The atrium was mildly dilated.  ------------------------------------------------------------------- Right ventricle: The cavity size was normal. Systolic function was normal.  ------------------------------------------------------------------- Pulmonic valve:  Structurally normal valve.  Cusp separation was normal. Doppler: Transvalvular velocity was within the normal range. There was trivial regurgitation.  ------------------------------------------------------------------- Tricuspid valve:  Doppler: There was trivial regurgitation.  ------------------------------------------------------------------- Right atrium: The atrium was normal in size.  ------------------------------------------------------------------- Pericardium: There was no pericardial effusion.  ------------------------------------------------------------------- Systemic veins: Inferior vena cava: The vessel was normal in size. The respirophasic diameter changes were in the normal range (= 50%), consistent with normal central venous pressure.  Diameter: 12 mm.  ------------------------------------------------------------------- Measurements  IVC                    Value     Reference ID                     12  mm    ---------  Left ventricle              Value     Reference LV ID, ED, PLAX chordal          45.6 mm    43 - 52 LV ID, ES, PLAX chordal          32.5 mm    23 - 38 LV fx shortening, PLAX chordal      29  %    >=29 LV PW thickness, ED            13.4 mm    --------- IVS/LV PW ratio, ED            1.22      <=1.3 Stroke volume, 2D             81  ml    --------- Stroke volume/bsa, 2D           40  ml/m^2  --------- LV e&', lateral              8.22 cm/s   --------- LV E/e&', lateral             15.94     --------- LV e&', medial               2.74 cm/s   --------- LV E/e&', medial              47.81     --------- LV e&', average              5.48 cm/s   --------- LV E/e&', average             23.91     ---------  Ventricular septum            Value     Reference IVS thickness, ED             16.4 mm    ---------  LVOT                   Value     Reference LVOT ID, S                21  mm    --------- LVOT area                 3.46 cm^2   --------- LVOT peak velocity, S           86.7 cm/s   --------- LVOT mean velocity, S           63.2 cm/s   --------- LVOT VTI, S                23.5 cm    ---------  Aortic valve               Value     Reference Aortic valve mean velocity, S       305  cm/s   --------- Aortic valve VTI, S            116  cm    --------- Aortic mean gradient, S  41  mm Hg  --------- Aortic peak gradient, S          64  mm Hg  --------- VTI ratio, LVOT/AV             0.2      --------- Aortic valve area, VTI          0.7  cm^2   --------- Aortic valve area/bsa, VTI        0.34 cm^2/m^2 --------- Aortic valve area/bsa, peak        0.37 cm^2/m^2 --------- velocity Velocity ratio, mean, LVOT/AV       0.21      --------- Aortic valve area/bsa, mean        0.35 cm^2/m^2 --------- velocity Aortic regurg pressure half-time     423  ms    ---------  Aorta                   Value     Reference Aortic root ID, ED            33  mm    ---------  Left atrium                Value     Reference LA ID, A-P, ES              38  mm    --------- LA ID/bsa, A-P              1.87 cm/m^2  <=2.2 LA volume, S               65  ml    --------- LA volume/bsa, S             32  ml/m^2  --------- LA volume, ES, 1-p A4C          54  ml    --------- LA volume/bsa, ES, 1-p A4C        26.6 ml/m^2  --------- LA volume, ES, 1-p A2C          78  ml    --------- LA volume/bsa, ES, 1-p A2C        38.4 ml/m^2  ---------  Mitral valve               Value     Reference Mitral E-wave peak velocity        131  cm/s   --------- Mitral A-wave peak velocity        65.2 cm/s   --------- Mitral deceleration time     (H)   246  ms    150 - 230 Mitral peak gradient, D          7   mm Hg  --------- Mitral E/A ratio, peak          2       ---------  Pulmonary arteries            Value     Reference PA pressure, S, DP        (H)   44  mm Hg  <=30  Tricuspid valve              Value     Reference Tricuspid regurg peak velocity      322  cm/s   --------- Tricuspid peak RV-RA gradient        41  mm Hg  --------- Tricuspid maximal regurg         322  cm/s   ---------  velocity, PISA  Systemic veins              Value     Reference Estimated CVP               3   mm Hg  ---------  Legend: (L) and (H) mark values outside specified reference range.  ------------------------------------------------------------------- Prepared and Electronically Authenticated by  Loralie Champagne, M.D. 2015-12-14T15:14:32       Cardiac Cath:  Conclusion     Mid RCA lesion, 100% stenosed.  Ost 1st Diag lesion, 70% stenosed.  Ost Cx lesion, 50% stenosed.  Mid Cx lesion, 40% stenosed.  Ost RCA lesion, 60% stenosed.  Normal LV function with an ejection fraction at a proximally 55% but with evidence for small focal region of mid to basal inferior hypocontractility. 2+ angiographic mitral regurgitation with mean palmar E wedge pressure at 19 and V wave at 31 mmHg. Severe aortic valve stenosis with a peak to peak gradient of 46 mmHg, and a mean gradient of 39.5 mmHg. Calculated aortic valve  area 0.64 cm. 1 - 2+ angiographic aortic regurgitation. Significant native coronary obstructive disease with evidence for coronary calcification and 70% proximal first diagonal stenosis; 50% proximal circumflex stenosis before the first marginal branch with 40% AV groove circumflex stenosis; 60% ostial narrowing of the RCA with 100% mid RCA occlusion after a marginal branch with bridging collaterals supplying the mid RCA extending from this marginal branch as well as left-to-right collaterals from the distal circumflex and LAD to the distal RCA.  RECOMMENDATION: Surgical consultation will be obtained for aortic valve replacement/CABG revascularization surgery.     Coronary Findings    Dominance: Right   Left Anterior Descending   . 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 Ost 1st Diag lesion, 70% stenosed. diffuse .       Left Circumflex   . Ost Cx lesion, 50% stenosed. calcified .   Marland Kitchen Mid Cx lesion, 40% stenosed.     Right Coronary Artery  Dist RCA filled by collaterals from Acute Mrg. Mid RCA filled by collaterals from Acute Mrg.   . Ost RCA lesion, 60% stenosed.   . Mid RCA lesion, 100% stenosed.   . Right Posterior Descending Artery   RPDA filled by collaterals from Dist LAD.   Marland Kitchen Inferior Septal   Inf Sept filled by collaterals from Dist Cx.       Right Heart Pressures Hemodynamic findings consistent with mild pulmonary hypertension, mitral valve regurgitation and aortic stenosis. Elevated LV EDP consistent with volume overload.    Wall Motion                 Left Heart    Left Ventricle The left ventricular size is normal. The left ventricular systolic function is normal. There are wall motion abnormalities in the left ventricle. Mild focal upper mid to basal hypokinesis   Aortic Valve There is severe aortic valve stenosis, and mild (2+) aortic regurgitation. The aortic valve is calcified. There is restricted aortic valve motion. The aortic valve is not congenitally bicuspid.    Coronary Diagrams    Diagnostic Diagram            Hemo Data       Most Recent Value   Fick Cardiac Output  4.07 L/min   Fick Cardiac Output Index  2.05 (L/min)/BSA   Thermal Cardiac Output  4.28 L/min   Thermal Cardiac Output Index  2.15 (L/min)/BSA   Aortic Mean Gradient  39.5 mmHg  Aortic Peak Gradient  46 mmHg   RA A Wave  9 mmHg   RA V Wave  6 mmHg   RA Mean  5 mmHg   RV Systolic Pressure  36 mmHg   RV Diastolic Pressure  -4 mmHg   RV EDP  8 mmHg   PA Systolic Pressure  35 mmHg   PA Diastolic Pressure  15 mmHg   PA Mean  25 mmHg   PW A Wave  17 mmHg   PW V Wave  25 mmHg   PW Mean  16 mmHg   AO Systolic Pressure  382 mmHg   AO Diastolic Pressure  55 mmHg   AO Mean  82 mmHg   LV Systolic Pressure  505 mmHg   LV Diastolic Pressure  7 mmHg   LV EDP   21 mmHg   Arterial Occlusion Pressure Extended Systolic Pressure  397 mmHg   Arterial Occlusion Pressure Extended Diastolic Pressure  56 mmHg   Arterial Occlusion Pressure Extended Mean Pressure  90 mmHg   Left Ventricular Apex Extended Systolic Pressure  673 mmHg   Left Ventricular Apex Extended Diastolic Pressure  15 mmHg   Left Ventricular Apex Extended EDP Pressure  22 mmHg    Implants    Name ID Temporary Type Supply   No information to display    Hemo Data       Most Recent Value   Fick Cardiac Output  4.07 L/min   Fick Cardiac Output Index  2.05 (L/min)/BSA   Thermal Cardiac Output  4.28 L/min   Thermal Cardiac Output Index  2.15 (L/min)/BSA   Aortic Mean Gradient  39.5 mmHg   Aortic Peak Gradient  46 mmHg   RA A Wave  9 mmHg   RA V Wave  6 mmHg   RA Mean  5 mmHg   RV Systolic Pressure  36 mmHg   RV Diastolic Pressure  -4 mmHg   RV EDP  8 mmHg   PA Systolic Pressure  35 mmHg   PA Diastolic Pressure  15 mmHg   PA Mean  25 mmHg   PW A Wave  17 mmHg   PW V Wave  25 mmHg   PW Mean  16 mmHg   AO Systolic Pressure  419 mmHg   AO Diastolic Pressure  55 mmHg   AO Mean  82 mmHg   LV Systolic Pressure  379 mmHg   LV Diastolic Pressure  7 mmHg   LV EDP  21 mmHg   Arterial Occlusion Pressure Extended Systolic Pressure  024 mmHg   Arterial Occlusion Pressure Extended Diastolic Pressure  56 mmHg   Arterial Occlusion Pressure Extended Mean Pressure  90 mmHg   Left Ventricular Apex Extended Systolic Pressure  097 mmHg   Left Ventricular Apex Extended Diastolic Pressure  15 mmHg   Left Ventricular Apex Extended EDP Pressure  22 mmHg    Order-Level Documents:    There are no order-level documents.    Encounter-Level Documents - 07/12/14:      Scan on 07/26/2014 5:16 PM by Provider Default, MDScan on 07/26/2014 5:16 PM by Provider Default, MD     Scan on 07/26/2014 5:12 PM by Provider Default, MDScan on 07/26/2014 5:12 PM by  Provider Default, MD     Scan on 07/25/2014 10:08 AM by Provider Default, MDScan on 07/25/2014 10:08 AM by Provider Default, MD     Electronic signature on 07/25/2014 6:15 AM     Scan on 08/02/2014 9:39  AM by Provider Default, MDScan on 08/02/2014 9:39 AM by Provider Default, MD    Signed    Electronically signed by Troy Sine, MD on 07/25/14 at 1056 EDT     Impression:  I have personally reviewed her cardiac cath and echo studies. She has Stage D symptomatic severe aortic stenosis and moderate coronary disease. Her chest tightness radiating into her arms suggests angina but her coronary disease is not much different than on her last cath in 2012 with a chronically occluded and collateralized RCA with otherwise mild to moderate disease and no stenosis in the large LAD except for the D1 stenosis. Her symptoms are also consistent with severe aortic stenosis and it may be that the severe AS and mild to moderate AI in combination with the moderate coronary disease is bringing out symptoms. Unfortunately she has a completely calcified ascending aorta and arch which is not surprising in someone with prior abdominal aortic occlusion and I think open surgical AVR and CABG is out of the question. I think TAVR would be a reasonable alternative for treating her AS and if she continued to have anginal symptoms it may be possible to do PCI. I discussed the treatment of severe AS with her and her family and reviewed the echo and cath studies with them. We discussed treatment alternatives including open surgery , TAVR and palliative medical therapy. We discussed the natural progression of AS with worsening symptoms leading to death. She would like to proceed with TAVR evaluation.   Plan:  She will be scheduled for gated cardiac CT, CTA of the chest, abdomen and pelvis, and PFT's. I will see her back afterward to discuss the results and if these studies show that TAVR is feasible for her I will have her  evaluated by Dr. Roxy Manns and Dr. Burt Knack or Dr. Angelena Form.    Gaye Pollack, MD [facility] [phone]

## 2014-08-08 ENCOUNTER — Ambulatory Visit (HOSPITAL_COMMUNITY): Payer: Medicare Other

## 2014-08-08 ENCOUNTER — Ambulatory Visit (HOSPITAL_COMMUNITY)
Admission: RE | Admit: 2014-08-08 | Discharge: 2014-08-08 | Disposition: A | Discharge status: Home / Self Care | Payer: Medicare Other | Source: Ambulatory Visit | Attending: Surgery | Admitting: Surgery

## 2014-08-08 ENCOUNTER — Encounter (HOSPITAL_COMMUNITY): Payer: Self-pay

## 2014-08-08 DIAGNOSIS — I350 Nonrheumatic aortic (valve) stenosis: Secondary | ICD-10-CM

## 2014-08-08 DIAGNOSIS — I700 Atherosclerosis of aorta: Secondary | ICD-10-CM | POA: Insufficient documentation

## 2014-08-08 DIAGNOSIS — K8020 Calculus of gallbladder without cholecystitis without obstruction: Secondary | ICD-10-CM | POA: Diagnosis not present

## 2014-08-08 DIAGNOSIS — Z01818 Encounter for other preprocedural examination: Secondary | ICD-10-CM | POA: Insufficient documentation

## 2014-08-08 DIAGNOSIS — M899 Disorder of bone, unspecified: Secondary | ICD-10-CM | POA: Diagnosis not present

## 2014-08-08 DIAGNOSIS — R918 Other nonspecific abnormal finding of lung field: Secondary | ICD-10-CM | POA: Diagnosis not present

## 2014-08-08 DIAGNOSIS — I2584 Coronary atherosclerosis due to calcified coronary lesion: Secondary | ICD-10-CM | POA: Diagnosis not present

## 2014-08-08 DIAGNOSIS — I2510 Atherosclerotic heart disease of native coronary artery without angina pectoris: Secondary | ICD-10-CM | POA: Insufficient documentation

## 2014-08-08 DIAGNOSIS — J439 Emphysema, unspecified: Secondary | ICD-10-CM | POA: Diagnosis not present

## 2014-08-08 LAB — PULMONARY FUNCTION TEST
DL/VA % pred: 71 %
DL/VA: 3.86 ml/min/mmHg/L
DLCO UNC % PRED: 57 %
DLCO UNC: 18.45 ml/min/mmHg
DLCO cor % pred: 55 %
DLCO cor: 17.87 ml/min/mmHg
FEF 25-75 Post: 1.1 L/sec
FEF 25-75 Pre: 0.72 L/sec
FEF2575-%Change-Post: 52 %
FEF2575-%PRED-POST: 50 %
FEF2575-%Pred-Pre: 33 %
FEV1-%CHANGE-POST: 9 %
FEV1-%PRED-PRE: 61 %
FEV1-%Pred-Post: 66 %
FEV1-Post: 1.88 L
FEV1-Pre: 1.72 L
FEV1FVC-%CHANGE-POST: -3 %
FEV1FVC-%Pred-Pre: 82 %
FEV6-%CHANGE-POST: 9 %
FEV6-%PRED-POST: 78 %
FEV6-%Pred-Pre: 71 %
FEV6-POST: 2.77 L
FEV6-PRE: 2.53 L
FEV6FVC-%CHANGE-POST: -3 %
FEV6FVC-%Pred-Post: 93 %
FEV6FVC-%Pred-Pre: 96 %
FVC-%CHANGE-POST: 13 %
FVC-%Pred-Post: 83 %
FVC-%Pred-Pre: 73 %
FVC-PRE: 2.72 L
FVC-Post: 3.09 L
POST FEV1/FVC RATIO: 61 %
POST FEV6/FVC RATIO: 90 %
PRE FEV1/FVC RATIO: 63 %
PRE FEV6/FVC RATIO: 93 %
RV % pred: 113 %
RV: 2.88 L
TLC % PRED: 95 %
TLC: 5.69 L

## 2014-08-08 MED ORDER — IOHEXOL 350 MG/ML SOLN
80.0000 mL | Freq: Once | INTRAVENOUS | Status: AC | PRN
  Administered 2014-08-08: 80 mL via INTRAVENOUS

## 2014-08-08 MED ORDER — METOPROLOL TARTRATE 1 MG/ML IV SOLN
INTRAVENOUS | Status: AC
  Administered 2014-08-08: 2.5 mg
  Filled 2014-08-08: qty 5

## 2014-08-08 MED ORDER — IOHEXOL 350 MG/ML SOLN
70.0000 mL | Freq: Once | INTRAVENOUS | Status: AC | PRN
  Administered 2014-08-08: 70 mL via INTRAVENOUS

## 2014-08-08 MED ORDER — ALBUTEROL SULFATE (2.5 MG/3ML) 0.083% IN NEBU
2.5000 mg | INHALATION_SOLUTION | Freq: Once | RESPIRATORY_TRACT | Status: AC
  Administered 2014-08-08: 2.5 mg via RESPIRATORY_TRACT

## 2014-08-15 ENCOUNTER — Ambulatory Visit (INDEPENDENT_AMBULATORY_CARE_PROVIDER_SITE_OTHER): Payer: Medicare Other | Admitting: Surgery

## 2014-08-15 ENCOUNTER — Encounter: Payer: Self-pay | Admitting: Surgery

## 2014-08-15 ENCOUNTER — Encounter: Payer: Self-pay | Admitting: Physical Therapy

## 2014-08-15 ENCOUNTER — Ambulatory Visit: Payer: Medicare Other | Attending: Surgery | Admitting: Physical Therapy

## 2014-08-15 VITALS — BP 148/72 | HR 60 | Resp 16 | Ht 70.0 in | Wt 179.0 lb

## 2014-08-15 DIAGNOSIS — R262 Difficulty in walking, not elsewhere classified: Secondary | ICD-10-CM | POA: Diagnosis not present

## 2014-08-15 DIAGNOSIS — I350 Nonrheumatic aortic (valve) stenosis: Secondary | ICD-10-CM

## 2014-08-15 DIAGNOSIS — I2510 Atherosclerotic heart disease of native coronary artery without angina pectoris: Secondary | ICD-10-CM | POA: Diagnosis not present

## 2014-08-16 ENCOUNTER — Encounter: Payer: Self-pay | Admitting: Surgery

## 2014-08-16 NOTE — Progress Notes (Signed)
HPI:  The patient returns today to review the results of her recent CT scans done for TAVR workup and to discuss further plans. She says that she has been feeling worse since I last saw her on 08/02/2014. She has been very fatigued and short of breath with any activity. Her family says that she almost collapsed getting out of the car at home after out last visit.   Current Outpatient Prescriptions  Medication Sig Dispense Refill  . cetirizine (ZYRTEC) 10 MG tablet Take 10 mg by mouth daily.      Marland Kitchen estrogens, conjugated, (PREMARIN) 1.25 MG tablet Take 1.25 mg by mouth daily.      Marland Kitchen FOLIC ACID PO Take by mouth daily.      . folic acid-pyridoxine-cyancobalamin (FOLBIC) 2.5-25-2 MG TABS Take 1 tablet by mouth daily. 90 tablet 3  . furosemide (LASIX) 20 MG tablet Take 20 mg by mouth as needed.     . meloxicam (MOBIC) 15 MG tablet Take 15 mg by mouth daily.      . metoprolol succinate (TOPROL-XL) 25 MG 24 hr tablet Take 1 tablet (25 mg total) by mouth daily. 30 tablet 9  . potassium chloride (KLOR-CON) 10 MEQ CR tablet Take 10 mEq by mouth as needed.     . rosuvastatin (CRESTOR) 20 MG tablet Take 20 mg by mouth daily.      . valsartan (DIOVAN) 80 MG tablet Take 1 tablet (80 mg total) by mouth daily. 30 tablet 12  . warfarin (COUMADIN) 3 MG tablet Take 3 mg by mouth as directed.       No current facility-administered medications for this visit.     Physical Exam: BP 148/72 mmHg  Pulse 60  Resp 16  Ht '5\' 10"'$  (1.778 m)  Wt 179 lb (81.194 kg)  BMI 25.68 kg/m2  SpO2 96% Looks tired but in no distress Lungs are clear Cardiac exam shows a regular rate and rhythm with a 3/6 harsh systolic murmur along RSB. 2/6 diastolic murmur along RSB radiating to apex There is no peripheral edema  Diagnostic Tests:  ADDENDUM REPORT: 08/08/2014 19:16  CLINICAL DATA: 74 year old female with severe aortic stenosis.  EXAM: Cardiac TAVR CT  TECHNIQUE: The patient was scanned on a Philips 256  scanner. A 120 kV retrospective scan was triggered in the descending thoracic aorta at 111 HU's. Gantry rotation speed was 270 msecs and collimation was .9 mm. 2.5 of iv Metoprolol and no nitro were given. The 3D data set was reconstructed in 5% intervals of the R-R cycle. Systolic and diastolic phases were analyzed on a dedicated work station using MPR, MIP and VRT modes. The patient received 80 cc of contrast.  FINDINGS: Aortic Valve: Trileaflet, severely thickened, moderately calcified with severely restricted leaflet excursions.  Aorta: Normal caliber, no dissection. There are severe diffuse calcifications in the entire thoracic aorta predominantly in the aortic arch and descending thoracic aorta (no porcelain aorta).  Sinotubular Junction: 30 x 29 mm  Ascending Thoracic Aorta: 32 x 32 mm  Aortic Arch: 27 x 24 mm  Descending Thoracic Aorta: 25 x 23 mm  Sinus of Valsalva Measurements:  Non-coronary: 35 mm  Right -coronary: 35 mm  Left -coronary: 36 mm  Coronary Artery Height above Annulus:  Left Main: 14 mm  Right Coronary: 18 mm  Virtual Basal Annulus Measurements:  Maximum/Minimum Diameter: 28 x 24 mm  Perimeter: 99 mm  Area: 551 mm2  Coronary Arteries: The study suboptimal for coronary evaluation  performed without use of NTG.  Optimum Fluoroscopic Angle for Delivery: LAO 10 CAU 4  IMPRESSION: 1. Severely thickened and moderately calcified aortic valve with restricted leaflet opening and annular measurements suitable for delivery of 29 mm Edward-SAPIEN 3 valve.  2. Sufficient annulus to coronary distance.  3. Optimum fluoroscopic angle for delivery LAO 10 CAU 4.  4. Severe diffuse calcifications in the entire thoracic aorta predominantly in the aortic arch and descending thoracic aorta.  Ena Dawley   Electronically Signed  By: Ena Dawley  On: 08/08/2014 19:16      Study Result      EXAM: OVER-READ INTERPRETATION CT CHEST  The following report is an over-read performed by radiologist Dr. Rebekah Chesterfield [facility] on 08/08/2014. This over-read does not include interpretation of cardiac or coronary anatomy or pathology. The coronary calcium score/coronary CTA interpretation by the cardiologist is attached.  COMPARISON: No priors.  FINDINGS: Extracardiac findings will be fully described on separate dictation for a contemporaneously obtained CTA of the chest abdomen and pelvis.  IMPRESSION: See separate dictation for full description of extracardiac findings associated with the report for the CTA of the chest, abdomen and pelvis performed 08/08/2014.  Electronically Signed: By: Vinnie Langton M.D. On: 08/08/2014 15:30   CLINICAL DATA: 74 year old female with history of severe aortic stenosis. Preprocedural study prior to potential transcatheter aortic valve replacement (TAVR).  EXAM: CT ANGIOGRAPHY CHEST, ABDOMEN AND PELVIS  TECHNIQUE: Multidetector CT imaging through the chest, abdomen and pelvis was performed using the standard protocol during bolus administration of intravenous contrast. Multiplanar reconstructed images and MIPs were obtained and reviewed to evaluate the vascular anatomy.  CONTRAST: 87m OMNIPAQUE IOHEXOL 350 MG/ML SOLN, 731mOMNIPAQUE IOHEXOL 350 MG/ML SOLN  COMPARISON: CTA of the abdomen and pelvis 10/27/2010.  FINDINGS: CTA CHEST FINDINGS  Mediastinum/Lymph Nodes: Heart size is borderline enlarged with concentric left ventricular hypertrophy. There is no significant pericardial fluid, thickening or pericardial calcification. Severe thickening calcifications of the aortic valve. There is atherosclerosis of the thoracic aorta, the great vessels of the mediastinum and the coronary arteries, including calcified atherosclerotic plaque in the left main, left anterior descending, left circumflex  and right coronary arteries. No pathologically enlarged mediastinal or hilar lymph nodes. Numerous densely calcified right hilar and mediastinal lymph nodes are noted. Esophagus is unremarkable in appearance. No axillary lymphadenopathy.  Lungs/Pleura: Several calcified pulmonary nodules are noted in the lungs bilaterally, compatible with benign granulomas. 4 mm ground-glass attenuation nodule in the superior segment of the left lower lobe (image 21 of series 407). No other larger more suspicious appearing pulmonary nodules or masses are otherwise noted. Bilateral apical pleuroparenchymal thickening, somewhat nodular in appearance, presumably areas of post infectious or inflammatory scarring. Dependent ground-glass attenuation in the posterior aspect of the right lower lobe may reflect a focus of infection or sequela of recent mild aspiration. Mild diffuse bronchial wall thickening with mild centrilobular and paraseptal emphysema. No pleural effusions.  Musculoskeletal/Soft Tissues: There are no aggressive appearing lytic or blastic lesions noted in the visualized portions of the skeleton.  CTA ABDOMEN AND PELVIS FINDINGS  Hepatobiliary: No suspicious appearing cystic or solid hepatic lesions are noted. No intra or extrahepatic biliary ductal dilatation. Tiny calcified gallstones layer dependently in the gallbladder. No signs of acute cholecystitis at this time.  Pancreas: No pancreatic mass. No pancreatic ductal dilatation. No pancreatic or peripancreatic fluid or inflammatory changes.  Spleen: Unremarkable.  Adrenals/Urinary Tract: Bilateral adrenal glands and bilateral kidneys are normal in appearance. No hydroureteronephrosis. Urinary  bladder is normal in appearance.  Stomach/Bowel: Normal appearance of the stomach. No pathologic dilatation of small bowel or colon. Normal appendix.  Vascular/Lymphatic: Vascular findings and measurements pertinent to potential  TAVR procedure, as detailed below. As described, patient is status post aortobifemoral bypass graft, which is widely patent. 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 Retroaortic left renal vein (normal anatomical variant) incidentally noted. No lymphadenopathy noted in the abdomen or pelvis.  Reproductive: Status post hysterectomy. Ovaries are not confidently identified may be surgically absent or atrophic.  Other: No significant volume of ascites. No pneumoperitoneum. Surgical clips in the left inguinal region. Immediately superficial to this there is a well-circumscribed 2.6 x 2.3 cm low-attenuation lesion which likely represents a chronic postoperative fluid collections such as a seroma; this finding is smaller than remote prior study 10/27/2010.  Musculoskeletal: Well-circumscribed lucent lesion with sclerotic margins and narrow zone of transition in the intertrochanteric region of the left femur measures approximately 2.2 cm in diameter and is very similar to remote prior study 10/27/2010, and although indeterminate, is presumably benign. There are no other aggressive appearing lytic or blastic lesions noted in the visualized portions of the skeleton.  VASCULAR MEASUREMENTS PERTINENT TO TAVR:  AORTA:  Minimal Aortic Diameter - 14 x 16 mm  Severity of Aortic Calcification - Moderate to severe in the native abdominal aorta which is completely occluded in the infrarenal region, replaced by the aortobifemoral bypass graft.  COMMENT: The patient is status post aortobifemoral bypass graft. The bypass graft is widely patent, and the measurements indicated below reflect the portions of the graft which replace the corresponding portions of the native pelvic vessels. The native common iliac arteries are occluded bilaterally, while there is reconstituted flow in the native external iliac arteries and  native common femoral arteries bilaterally, these vessels are diminutive in size and severely diseased with atherosclerosis.  RIGHT PELVIS:  Right Common Iliac Artery -  Minimal Diameter - 9.2 x 7.6 mm  Tortuosity - minimal  Calcification - none  Right External Iliac Artery -  Minimal Diameter - 10.3 x 10.3 mm  Tortuosity - minimal  Calcification - none  Right Common Femoral Artery -  Minimal Diameter - 10.1 x 9.4 mm  Tortuosity - minimal  Calcification - none  LEFT PELVIS:  Left Common Iliac Artery -  Minimal Diameter - 10.1 x 10.1 mm  Tortuosity - mild  Calcification - none  Left External Iliac Artery -  Minimal Diameter - 10.1 x 10.9 mm  Tortuosity - minimal  Calcification - none  Left Common Femoral Artery -  Minimal Diameter - 10.4 x 11.1 mm  Tortuosity - minimal  Calcification - none  Review of the MIP images confirms the above findings.  IMPRESSION: 1. Status post aortobifemoral bypass graft placement. The bypass graft is widely patent. Assuming vascular access is acceptable via the graft rather than the native vessels, this patient does appear to have suitable pelvic arterial access, as above. 2. Severe thickening in calcifications of the aortic valve, compatible with the reported clinical history of aortic stenosis. This is associated with some concentric left ventricular hypertrophy. 3. Dependent ground-glass attenuation airspace disease in the right lower lobe likely reflects a mild infection, or sequela of recent aspiration. Clinical correlation is recommended. 4. Mild diffuse bronchial wall thickening with mild centrilobular and paraseptal emphysema; imaging findings suggestive of underlying COPD. 5. Small resolving postoperative fluid collection in the left inguinal region is presumably a resolving  seroma or old chronic hematoma. 6. Additional incidental findings, as above.   Electronically  Signed  By: Vinnie Langton M.D.  On: 08/08/2014 15:52    Ref Range 8d ago    FVC-Pre L 2.72   FVC-%Pred-Pre % 73   FVC-Post L 3.09   FVC-%Pred-Post % 83   FVC-%Change-Post % 13   FEV1-Pre L 1.72   FEV1-%Pred-Pre % 61   FEV1-Post L 1.88   FEV1-%Pred-Post % 66   FEV1-%Change-Post % 9   FEV6-Pre L 2.53   FEV6-%Pred-Pre % 71   FEV6-Post L 2.77   FEV6-%Pred-Post % 78   FEV6-%Change-Post % 9   Pre FEV1/FVC ratio % 63   FEV1FVC-%Pred-Pre % 82   Post FEV1/FVC ratio % 61   FEV1FVC-%Change-Post % -3   Pre FEV6/FVC Ratio % 93   FEV6FVC-%Pred-Pre % 96   Post FEV6/FVC ratio % 90   FEV6FVC-%Pred-Post % 93   FEV6FVC-%Change-Post % -3   FEF 25-75 Pre L/sec 0.72   FEF2575-%Pred-Pre % 33   FEF 25-75 Post L/sec 1.10   FEF2575-%Pred-Post % 50   FEF2575-%Change-Post % 52   RV L 2.88   RV % pred % 113   TLC L 5.69   TLC % pred % 95   DLCO unc ml/min/mmHg 18.45   DLCO unc % pred % 57   DLCO cor ml/min/mmHg 17.87   DLCO cor % pred % 55   DL/VA ml/min/mmHg/L 3.86   DL/VA % pred % 71   Resulting Agency [facility]       Specimen Collected: 08/08/14 2:47 PM Last Resulted: 08/08/14 3:43 PM                   Physical Therapy Evaluation  Patient Details  Name: DANISA KOPEC MRN: [id number] Date of Birth: [birth year] Referring Provider: Gaye Pollack, MD  Encounter Date: 08/15/2014      PT End of Session - 08/15/14 1333    Visit Number 1   PT Start Time 1638   PT Stop Time 1404   PT Time Calculation (min) 36 min      Past Medical History  Diagnosis Date  . Hypertension   . Claudication   . Heart murmur   . Muscle pain   . Seroma, postoperative     Left Groin  . Leg pain   . Coronary artery disease   . 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 Peripheral vascular disease     Past Surgical History  Procedure Laterality Date  . Aortobifemoral bpg  06/27/10  .  Abdominal hysterectomy      Partial   . Pr vein bypass graft,aorto-fem-pop  06/12/10  . Cardiac catheterization N/A 07/25/2014    Procedure: Right/Left Heart Cath and Coronary Angiography; Surgeon: Troy Sine, MD; Location: [facility]; Service: Cardiovascular; Laterality: N/A;    There were no vitals filed for this visit.  Visit Diagnosis: Difficulty walking - Plan: PT PLAN OF CARE CERT/RE-CERT  Severe aortic stenosis - Plan: PT PLAN OF CARE CERT/RE-CERT      Subjective Assessment - 08/15/14 1331    Subjective started notcing heaviness in chest and radiating down arms with walking about a month to a month and half ago with walking - would ease with rest, also reports difficulty taking deep breath during these times   Patient Stated Goals improve mobility   Currently in Pain? No/denies            [facility] PT Assessment -  08/15/14 0001    Assessment   Medical Diagnosis severe aortic stenosis   Onset Date/Surgical Date 07/15/14  approximate   Precautions   Precautions None   Precaution Comments except no driving   Restrictions   Weight Bearing Restrictions No   Balance Screen   Has the patient fallen in the past 6 months No   Has the patient had a decrease in activity level because of a fear of falling?  No   Is the patient reluctant to leave their home because of a fear of falling?  No   Home Environment   Living Environment Private residence   Living Arrangements Spouse/significant other   Home Access Stairs to enter   Entrance Stairs-Number of Steps 3   Entrance Stairs-Rails None   Home Layout One level   Prior Function   Level of Independence Independent with basic ADLs;Independent with homemaking with ambulation  in last month, now afraid to go out without supervision   ROM / Strength   AROM / PROM / Strength AROM;Strength   AROM   Overall AROM  Within  functional limits for tasks performed   Strength   Overall Strength Comments grossly 5/5 RUE/RLE; LUE/LLE grossly 3-4/5 due to hx of CVAs   Strength Assessment Site Hand   Right/Left hand Right;Left   Right Hand Grip (lbs) 44   Left Hand Grip (lbs) 30          [facility] Pre-Surgical Assessment - 08/15/14 0001    5 Meter Walk Test- trial 1 4.8 sec   5 Meter Walk Test- trial 2 5 sec.   5 Meter Walk Test- trial 3 4.6 sec.  </= 6 sec WNL   5 meter walk test average 4.8 sec   Timed Up & Go Test trial  9.1 sec.   Comments </= 12 seconds WNL   4 Stage Balance Test tolerated for:  10 sec.   4 Stage Balance Test Position 4   comment not indicative if high fall risk   Comment unable to stand without UE support   ADL/IADL Independent with: Bathing;Dressing;Meal prep;Finances   ADL/IADL Needs Assistance with: Valla Leaver work   ADL/IADL Fraility Index Vulnerable   6 Minute Walk- Baseline yes   BP (mmHg) 146/60 mmHg   HR (bpm) 67   02 Sat (%RA) 95 %   Modified Borg Scale for Dyspnea 0- Nothing at all   Perceived Rate of Exertion (Borg) 6-   6 Minute Walk Post Test yes   BP (mmHg) 170/68 mmHg   HR (bpm) 81   02 Sat (%RA) 92 %   Modified Borg Scale for Dyspnea 3- Moderate shortness of breath or breathing difficulty   Perceived Rate of Exertion (Borg) 12-   Aerobic Endurance Distance Walked 880   Endurance additional comments seated rest at 2:00, 360' O2 92%, HR 86 bpm, rested for 1 minute then able to resume walking for the remainder of the 6 minutes                                     Plan - 08/16/14 0710    Clinical Impression Statement Pt is a 74 yo female presenting to OP PT for evaluation prior to possible TAVR surgery due to severe aortic stenosis. Pt presents with ROM WNL, fair strength as L side has residual weakness from a past CVA, good balance as  she did not score at a  high fall risk per Timed up and go or 4 stage balance test. Pt's gait speed was WNL. She was able to ambulate 880' in 6 minute walk test but required a seated rest break at 2:00 (360') due to SOB. At that time her O2 had dropped from 95% at rest to 92%. Pt recovered after 1 minute of rest and was able to complete the remainder without resting again. Pt's primary limitation in mobility at this time appears to be SOB with exertion.    PT Frequency One time visit          G-Codes - 08-18-14 0715    Functional Assessment Tool Used 880' in 6 minute walk with 1 rest required, O2 92%   Functional Limitation Mobility: Walking and moving around   Mobility: Walking and Moving Around Current Status [phone]) At least 20 percent but less than 40 percent impaired, limited or restricted   Mobility: Walking and Moving Around Goal Status [phone]) At least 20 percent but less than 40 percent impaired, limited or restricted   Mobility: Walking and Moving Around Discharge Status [phone]) At least 20 percent but less than 40 percent impaired, limited or restricted       Problem List Patient Active Problem List   Diagnosis Date Noted  . Aortic stenosis, severe   . CAD in native artery   . Aftercare following surgery of the circulatory system, [place] 02/06/2013  . Aortic valve stenosis 12/13/2012  . Peripheral vascular disease 12/13/2012  . Essential hypertension 12/13/2012  . Hyperlipidemia with target LDL less than 70 12/13/2012  . Atherosclerosis of native arteries of extremity with intermittent claudication 08/10/2011  . LUMBAR RADICULOPATHY, RIGHT 12/21/2007  . HIP PAIN, LEFT 10/19/2007    Romualdo Bolk, PT 08-18-2014, 7:17 AM  [place], [place], [address] Phone: [phone] Fax: [phone]       Impression:  I have personally reviewed  her CT studies. She has Stage D symptomatic severe aortic stenosis and moderate coronary disease. Cardiac CT shows that she is a candidate for a 29 mm Sapien 3 or XT valve. There is extensive calcification of the ascending aorta that would preclude transaortic access. The abdominal and pelvic CT shows a widely patent aortobifemoral bypass graft with severe native femoral artery disease. I don't think transfemoral access via a limb of the graft is advisable. I think it would be best to plan on transapical access. I am concerned that her symptoms are worsening with near collapse two weeks ago. We will try to get her in for a second surgical consultation and cardiology consultation as soon as possible so that she can be scheduled as soon as possible.   Following the decision to proceed with transcatheter aortic valve replacement, a discussion has been held regarding what types of management strategies would be attempted intraoperatively in the event of life-threatening complications, including whether or not the patient would be considered a candidate for the use of cardiopulmonary bypass and/or conversion to open sternotomy for attempted surgical intervention.  The patient has been advised of a variety of complications that might develop including but not limited to risks of death, stroke, paravalvular leak, aortic dissection or other major vascular complications, aortic annulus rupture, device embolization, cardiac rupture or perforation, mitral regurgitation, acute myocardial infarction, arrhythmia, heart block or bradycardia requiring permanent pacemaker placement, congestive heart failure, respiratory failure, renal failure, pneumonia, infection, other late complications related to structural valve deterioration or migration, or other complications that  might ultimately cause a temporary or permanent loss of functional independence or other long term morbidity.  The patient provides full informed consent for the  procedure as described and all questions were answered.   Plan:  Second surgical consult and cardiology consultation. The schedule TAVR   Gaye Pollack, MD [facility] [phone]

## 2014-08-16 NOTE — Therapy (Signed)
[place], [place], [address] Phone: [phone]   Fax:  [phone]  Physical Therapy Evaluation  Patient Details  Name: Katelyn [name] MRN: [id number] Date of Birth: [birth year] Referring Provider:  Gaye Pollack, MD  Encounter Date: 08/15/2014      PT End of Session - 08/15/14 1333    Visit Number 1   PT Start Time 4818   PT Stop Time 1404   PT Time Calculation (min) 36 min      Past Medical History  Diagnosis Date  . Hypertension   . Claudication   . Heart murmur   . Muscle pain   . Seroma, postoperative     Left Groin  . Leg pain   . Coronary artery disease   . 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 Peripheral vascular disease     Past Surgical History  Procedure Laterality Date  . Aortobifemoral bpg  06/27/10  . Abdominal hysterectomy      Partial   . Pr vein bypass graft,aorto-fem-pop  06/12/10  . Cardiac catheterization N/A 07/25/2014    Procedure: Right/Left Heart Cath and Coronary Angiography;  Surgeon: Troy Sine, MD;  Location: [facility];  Service: Cardiovascular;  Laterality: N/A;    There were no vitals filed for this visit.  Visit Diagnosis:  Difficulty walking - Plan: PT PLAN OF CARE CERT/RE-CERT  Severe aortic stenosis - Plan: PT PLAN OF CARE CERT/RE-CERT      Subjective Assessment - 08/15/14 1331    Subjective started notcing heaviness in chest and radiating down arms with walking about a month to a month and half ago with walking - would ease with rest, also reports difficulty taking deep breath during these times   Patient Stated Goals improve mobility   Currently in Pain? No/denies            [facility] PT Assessment - 08/15/14 0001    Assessment   Medical Diagnosis severe aortic stenosis   Onset Date/Surgical Date 07/15/14  approximate   Precautions   Precautions None   Precaution Comments except no driving   Restrictions   Weight Bearing Restrictions No    Balance Screen   Has the patient fallen in the past 6 months No   Has the patient had a decrease in activity level because of a fear of falling?  No   Is the patient reluctant to leave their home because of a fear of falling?  No   Home Environment   Living Environment Private residence   Living Arrangements Spouse/significant other   Home Access Stairs to enter   Entrance Stairs-Number of Steps 3   Entrance Stairs-Rails None   Home Layout One level   Prior Function   Level of Independence Independent with basic ADLs;Independent with homemaking with ambulation  in last month, now afraid to go out without supervision   ROM / Strength   AROM / PROM / Strength AROM;Strength   AROM   Overall AROM  Within functional limits for tasks performed   Strength   Overall Strength Comments grossly 5/5 RUE/RLE; LUE/LLE grossly 3-4/5 due to hx of CVAs   Strength Assessment Site Hand   Right/Left hand Right;Left   Right Hand Grip (lbs) 44   Left Hand Grip (lbs) 30          [facility] Pre-Surgical Assessment - 08/15/14 0001    5 Meter Walk Test- trial 1 4.8 sec  5 Meter Walk Test- trial 2 5 sec.    5 Meter Walk Test- trial 3 4.6 sec.  </= 6 sec WNL   5 meter walk test average 4.8 sec   Timed Up & Go Test trial  9.1 sec.   Comments </= 12 seconds WNL   4 Stage Balance Test tolerated for:  10 sec.   4 Stage Balance Test Position 4   comment not indicative if high fall risk   Comment unable to stand without UE support   ADL/IADL Independent with: Bathing;Dressing;Meal prep;Finances   ADL/IADL Needs Assistance with: [employer] work   ADL/IADL Fraility Index Vulnerable   6 Minute Walk- Baseline yes   BP (mmHg) 146/60 mmHg   HR (bpm) 67   02 Sat (%RA) 95 %   Modified Borg Scale for Dyspnea 0- Nothing at all   Perceived Rate of Exertion (Borg) 6-   6 Minute Walk Post Test yes   BP (mmHg) 170/68 mmHg   HR (bpm) 81   02 Sat (%RA) 92 %   Modified Borg Scale for Dyspnea 3- Moderate shortness of  breath or breathing difficulty   Perceived Rate of Exertion (Borg) 12-   Aerobic Endurance Distance Walked 880   Endurance additional comments seated rest at 2:00, 360' O2 92%, HR 86 bpm, rested for 1 minute then able to resume walking for the remainder of the 6 minutes                                     Plan - 09/13/14 0710    Clinical Impression Statement Pt is a 74 yo female presenting to OP PT for evaluation prior to possible TAVR surgery due to severe aortic stenosis. Pt presents with ROM WNL, fair strength as L side has residual weakness from a past CVA, good balance as she did not score at a high fall risk per Timed up and go or 4 stage balance test. Pt's gait speed was WNL. She was able to ambulate 880' in 6 minute walk test but required a seated rest break at 2:00 (360') due to SOB. At that time her O2 had dropped from 95% at rest to 92%. Pt recovered after 1 minute of rest and was able to complete the remainder without resting again. Pt's primary limitation in mobility at this time appears to be SOB with exertion.    PT Frequency One time visit          G-Codes - 09/13/14 0715    Functional Assessment Tool Used 880' in 6 minute walk with 1 rest required, O2 92%   Functional Limitation Mobility: Walking and moving around   Mobility: Walking and Moving Around Current Status [phone]) At least 20 percent but less than 40 percent impaired, limited or restricted   Mobility: Walking and Moving Around Goal Status [phone]) At least 20 percent but less than 40 percent impaired, limited or restricted   Mobility: Walking and Moving Around Discharge Status [phone]) At least 20 percent but less than 40 percent impaired, limited or restricted       Problem List Patient Active Problem List   Diagnosis Date Noted  . Aortic stenosis, severe   . CAD in native artery   . Aftercare following surgery of the circulatory system, [place] 02/06/2013  . Aortic valve stenosis  12/13/2012  . Peripheral vascular disease 12/13/2012  . Essential hypertension 12/13/2012  . Hyperlipidemia with  target LDL less than 70 12/13/2012  . Atherosclerosis of native arteries of extremity with intermittent claudication 08/10/2011  . LUMBAR RADICULOPATHY, RIGHT 12/21/2007  . HIP PAIN, LEFT 10/19/2007    Romualdo Bolk, PT 08/16/2014, 7:17 AM  [facility] [address] [place], [place], [address] Phone: [phone]   Fax:  [phone]

## 2014-08-23 ENCOUNTER — Ambulatory Visit (INDEPENDENT_AMBULATORY_CARE_PROVIDER_SITE_OTHER): Payer: Medicare Other | Admitting: Cardiovascular Disease

## 2014-08-23 ENCOUNTER — Encounter: Payer: Self-pay | Admitting: Cardiovascular Disease

## 2014-08-23 VITALS — BP 140/78 | HR 65 | Ht 70.0 in | Wt 180.0 lb

## 2014-08-23 DIAGNOSIS — I350 Nonrheumatic aortic (valve) stenosis: Secondary | ICD-10-CM

## 2014-08-23 DIAGNOSIS — I2510 Atherosclerotic heart disease of native coronary artery without angina pectoris: Secondary | ICD-10-CM

## 2014-08-23 NOTE — Progress Notes (Signed)
Chief Complaint  Patient presents with  . Shortness of Breath    History of Present Illness: 74 yo female with history of severe aortic valve stenosis, HTN, HLD, CAD, PAD, prior CVA who is referred today to the multi-disciplinary valve clinic for further discussion regarding her aortic stenosis and planning for TAVR. She is followed by Dr. Claiborne Billings. She is known to have CAD with last cath 07/25/14 showing chronically occluded RCA with left to right collaterals, 70% diagonal stenosis, 50% ostial Circumflex stenosis, 40% AV groove Circumflex stenosis. Echo shows normal LV systolic function with severe AS  with mean gradient 42 mmHg. The aortic valve is heavily calcified and does not open well. 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 Trula Slade in April 2012 for impending gangrene of her feet. Over the last 2 months, she has developed exertional chest discomfort with associated dyspnea and fatigue. This led to the cardiac cath which is noted above. Her CAD was felt to stable. She was referred to see Dr. Cyndia Bent in [place] surgery and it is felt that her aortic stenosis is severe and likely causing her symptoms. She is felt to be a poor candidate for conventional AVR. She is being considered for TAVR. She has diffuse aortic calcification which also makes her a poor candidate for open surgery or trans-aortic approach to TAVR. Her PAD with prior aortobifemoral bypass will limit the option of trans-femoral access for TAVR.   She tells me today that she is having progressive dyspnea with minimal exertion, chest pressure and dizziness. She denies any syncopal events. She is with her husband and daughter who endorse progression of symptoms over the last 4 weeks. No LE edema, weight gain, PND or orthopnea.   Primary Care Physician: Everette Rank Primary Cardiologist: Ellouise Newer   Past Medical History  Diagnosis  Date  . Hypertension   . Claudication   . Heart murmur   . Muscle pain   . Seroma, postoperative     Left Groin  . Leg pain   . Coronary artery disease   . 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 Peripheral vascular disease     Past Surgical History  Procedure Laterality Date  . Aortobifemoral bpg  06/27/10  . Abdominal hysterectomy      Partial   . Pr vein bypass graft,aorto-fem-pop  06/12/10  . Cardiac catheterization N/A 07/25/2014    Procedure: Right/Left Heart Cath and Coronary Angiography;  Surgeon: Troy Sine, MD;  Location: [facility];  Service: Cardiovascular;  Laterality: N/A;    Current Outpatient Prescriptions  Medication Sig Dispense Refill  . cetirizine (ZYRTEC) 10 MG tablet Take 10 mg by mouth daily.      Marland Kitchen estrogens, conjugated, (PREMARIN) 1.25 MG tablet Take 1.25 mg by mouth daily.      Marland Kitchen FOLIC ACID PO Take 1 tablet by mouth daily.     . folic acid-pyridoxine-cyancobalamin (FOLBIC) 2.5-25-2 MG TABS Take 1 tablet by mouth daily. 90 tablet 3  . furosemide (LASIX) 20 MG tablet Take 20 mg by mouth as needed (fluid retention).     . meloxicam (MOBIC) 15 MG tablet Take 15 mg by mouth daily.      . metoprolol succinate (TOPROL-XL) 25 MG 24 hr tablet Take 1 tablet (25 mg total) by mouth daily. 30 tablet 9  . potassium chloride (KLOR-CON) 10 MEQ CR tablet Take 10  mEq by mouth as needed (when taking Lasix).     . rosuvastatin (CRESTOR) 20 MG tablet Take 20 mg by mouth daily.      . valsartan (DIOVAN) 80 MG tablet Take 1 tablet (80 mg total) by mouth daily. 30 tablet 12  . warfarin (COUMADIN) 3 MG tablet Take 3 mg by mouth as directed.       No current facility-administered medications for this visit.    Allergies  Allergen Reactions  . Adhesive [Tape] Rash  . Latex Rash    History   Social History  . Marital Status: Married    Spouse Name: N/A  . Number of Children: N/A  . Years of Education: N/A   Occupational History  . Not on file.   Social History Main  Topics  . Smoking status: Former Smoker -- 2.00 packs/day for 56 years    Types: Cigarettes    Quit date: 06/01/2010  . Smokeless tobacco: Never Used     Comment: HAD WEANED DOWN TO 1/4 PK A DAY BEFORE D/C  . Alcohol Use: No  . Drug Use: No  . Sexual Activity: Not on file   Other Topics Concern  . Not on file   Social History Narrative    Family History  Problem Relation Age of Onset  . Heart disease Mother   . Varicose Veins Mother   . Stroke Father   . Hypertension Father   . Heart disease Father     Aneurysm  . Heart attack Father   . Arthritis Brother   . Diabetes Daughter     Review of Systems:  As stated in the HPI and otherwise negative.   BP 140/78 mmHg  Pulse 65  Ht '5\' 10"'$  (1.778 m)  Wt 180 lb (81.647 kg)  BMI 25.83 kg/m2  Physical Examination: General: Well developed, well nourished, NAD HEENT: OP clear, mucus membranes moist SKIN: warm, dry. No rashes. Neuro: No focal deficits Musculoskeletal: Muscle strength 5/5 all ext Psychiatric: Mood and affect normal Neck: No JVD, no carotid bruits, no thyromegaly, no lymphadenopathy. Lungs:Clear bilaterally, no wheezes, rhonci, crackles Cardiovascular: Regular rate and rhythm. Harsh loud systolic murmur noted. No gallops or rubs. Abdomen:Soft. Bowel sounds present. Non-tender.  Extremities: No lower extremity edema. Pulses are 2 + in the bilateral DP/PT.  Cardiac cath 07/25/14: Normal LV function with an ejection fraction at a proximally 55% but with evidence for small focal region of mid to basal inferior hypocontractility. 2+ angiographic mitral regurgitation with mean palmar E wedge pressure at 19 and V wave at 31 mmHg. Severe aortic valve stenosis with a peak to peak gradient of 46 mmHg, and a mean gradient of 39.5 mmHg. Calculated aortic valve  area 0.64 cm. 1 - 2+ angiographic aortic regurgitation. Significant native coronary obstructive disease with evidence for coronary calcification and  70% proximal first diagonal stenosis; 50% proximal circumflex stenosis before the first marginal branch with 40% AV groove circumflex stenosis; 60% ostial narrowing of the RCA with 100% mid RCA occlusion after a marginal branch with bridging collaterals supplying the mid RCA extending from this marginal branch as well as left-to-right collaterals from the distal circumflex and LAD to the distal RCA.  RECOMMENDATION: Surgical consultation will be obtained for aortic valve replacement/CABG revascularization surgery.  Echo 02/12/14: Left ventricle: The cavity size was normal. Wall thickness was increased in a pattern of moderate LVH. Systolic function was normal. The estimated ejection fraction was in the range of 55% to 60%. Features are consistent  with a pseudonormal left ventricular filling pattern, with concomitant abnormal relaxation and increased filling pressure (grade 2 diastolic dysfunction). E/medial e&' > 15, suggesting LV end diastolic pressure at least 20 mmHg. - Aortic valve: Trileaflet; severely calcified leaflets. There was severe stenosis. There was mild regurgitation. Mean gradient (S): 42 mm Hg. Peak gradient (S): 64 mm Hg. Valve area (VTI): 0.7 cm^2. - Mitral valve: Moderately calcified annulus. Mildly calcified leaflets . There was trivial regurgitation. - Left atrium: The atrium was mildly dilated. - Right ventricle: The cavity size was normal. Systolic function was normal. - Tricuspid valve: Peak RV-RA gradient (S): 41 mm Hg. - Pulmonary arteries: PA peak pressure: 44 mm Hg (S). - Inferior vena cava: The vessel was normal in size. The respirophasic diameter changes were in the normal range (= 50%), consistent with normal central venous pressure.  Impressions:  - Normal LV size with moderate LV hypertrophy, EF 55-60%. Moderate diastolic dysfunction with evidence for elevated LV filling pressure. Normal RV size and systolic function.  Severe aortic stenosis with mild aortic insufficiency. Trileaflet valve. Mild pulmonary hypertension.  Transthoracic echocardiography. M-mode, complete 2D, spectral Doppler, and color Doppler. Birthdate: Patient birthdate: May 22, 1940. Age: Patient is 74 yr old. Sex: Gender: female. BMI: 26.1 kg/m^2. Blood pressure:   171/71 Patient status: Outpatient. Study date: Study date: 02/12/2014. Study time: 10:15 AM. Location: Echo laboratory.  -------------------------------------------------------------------  ------------------------------------------------------------------- Left ventricle: The cavity size was normal. Wall thickness was increased in a pattern of moderate LVH. Systolic function was normal. The estimated ejection fraction was in the range of 55% to 60%. Features are consistent with a pseudonormal left ventricular filling pattern, with concomitant abnormal relaxation and increased filling pressure (grade 2 diastolic dysfunction). E/medial e&' > 15, suggesting LV end diastolic pressure at least 20 mmHg.  ------------------------------------------------------------------- Aortic valve:  Trileaflet; severely calcified leaflets. Doppler: There was severe stenosis.  There was mild regurgitation.  VTI ratio of LVOT to aortic valve: 0.2. Valve area (VTI): 0.7 cm^2. Indexed valve area (VTI): 0.34 cm^2/m^2. Indexed valve area (Vmax): 0.37 cm^2/m^2. Mean velocity ratio of LVOT to aortic valve: 0.21. Indexed valve area (Vmean): 0.35 cm^2/m^2.  Mean gradient (S): 42 mm Hg. Peak gradient (S): 64 mm Hg.  ------------------------------------------------------------------- Aorta: Aortic root: The aortic root was normal in size. Ascending aorta: The ascending aorta was normal in size.  ------------------------------------------------------------------- Mitral valve:  Moderately calcified annulus. Mildly calcified leaflets . Doppler:  There was no evidence  for stenosis.  There was trivial regurgitation.  Peak gradient (D): 7 mm Hg.  ------------------------------------------------------------------- Left atrium: LA volume/ BSA = 32.3 ml/m2. The atrium was mildly dilated.  ------------------------------------------------------------------- Right ventricle: The cavity size was normal. Systolic function was normal.  ------------------------------------------------------------------- Pulmonic valve:  Structurally normal valve.  Cusp separation was normal. Doppler: Transvalvular velocity was within the normal range. There was trivial regurgitation.  ------------------------------------------------------------------- Tricuspid valve:  Doppler: There was trivial regurgitation.  ------------------------------------------------------------------- Right atrium: The atrium was normal in size.  ------------------------------------------------------------------- Pericardium: There was no pericardial effusion.  ------------------------------------------------------------------- Systemic veins: Inferior vena cava: The vessel was normal in size. The respirophasic diameter changes were in the normal range (= 50%), consistent with normal central venous pressure. Diameter: 12 mm.  ------------------------------------------------------------------- Measurements  IVC                    Value     Reference ID                    12  mm    ---------  Left ventricle              Value     Reference LV ID, ED, PLAX chordal          45.6 mm    43 - 52 LV ID, ES, PLAX chordal          32.5 mm    23 - 38 LV fx shortening, PLAX chordal      29  %    >=29 LV PW thickness, ED            13.4 mm    --------- IVS/LV PW ratio, ED            1.22      <=1.3 Stroke volume, 2D             81   ml    --------- Stroke volume/bsa, 2D           40  ml/m^2  --------- LV e&', lateral              8.22 cm/s   --------- LV E/e&', lateral             15.94     --------- LV e&', medial               2.74 cm/s   --------- LV E/e&', medial              47.81     --------- LV e&', average              5.48 cm/s   --------- LV E/e&', average             23.91     ---------  Ventricular septum            Value     Reference IVS thickness, ED             16.4 mm    ---------  LVOT                   Value     Reference LVOT ID, S                21  mm    --------- LVOT area                 3.46 cm^2   --------- LVOT peak velocity, S           86.7 cm/s   --------- LVOT mean velocity, S           63.2 cm/s   --------- LVOT VTI, S                23.5 cm    ---------  Aortic valve               Value     Reference Aortic valve mean velocity, S       305  cm/s   --------- Aortic valve VTI, S            116  cm    --------- Aortic mean gradient, S          41  mm Hg  --------- Aortic peak gradient, S          64  mm Hg  --------- VTI ratio, LVOT/AV            0.2      --------- Aortic valve area, VTI  0.7  cm^2   --------- Aortic valve area/bsa, VTI        0.34 cm^2/m^2 --------- Aortic valve area/bsa, peak        0.37 cm^2/m^2 --------- velocity Velocity ratio, mean, LVOT/AV       0.21      --------- Aortic valve area/bsa, mean        0.35 cm^2/m^2 --------- velocity Aortic regurg pressure half-time     423  ms    ---------  Aorta                    Value     Reference Aortic root ID, ED            33  mm    ---------  Left atrium                Value     Reference LA ID, A-P, ES              38  mm    --------- LA ID/bsa, A-P              1.87 cm/m^2  <=2.2 LA volume, S               65  ml    --------- LA volume/bsa, S             32  ml/m^2  --------- LA volume, ES, 1-p A4C          54  ml    --------- LA volume/bsa, ES, 1-p A4C        26.6 ml/m^2  --------- LA volume, ES, 1-p A2C          78  ml    --------- LA volume/bsa, ES, 1-p A2C        38.4 ml/m^2  ---------  Mitral valve               Value     Reference Mitral E-wave peak velocity        131  cm/s   --------- Mitral A-wave peak velocity        65.2 cm/s   --------- Mitral deceleration time     (H)   246  ms    150 - 230 Mitral peak gradient, D          7   mm Hg  --------- Mitral E/A ratio, peak          2       ---------  Pulmonary arteries            Value     Reference PA pressure, S, DP        (H)   44  mm Hg  <=30  Tricuspid valve              Value     Reference Tricuspid regurg peak velocity      322  cm/s   --------- Tricuspid peak RV-RA gradient       41  mm Hg  --------- Tricuspid maximal regurg         322  cm/s   --------- velocity, PISA  Systemic veins              Value     Reference Estimated CVP               3   mm Hg  ---------  CTA chest/abdomen/chest 08/08/14: CTA CHEST FINDINGS  Mediastinum/Lymph Nodes:  Heart size is borderline enlarged with concentric left ventricular hypertrophy. There is no significant pericardial fluid, thickening or  pericardial calcification. Severe thickening calcifications of the aortic valve. There is atherosclerosis of the thoracic aorta, the great vessels of the mediastinum and the coronary arteries, including calcified atherosclerotic plaque in the left main, left anterior descending, left circumflex and right coronary arteries. No pathologically enlarged mediastinal or hilar lymph nodes. Numerous densely calcified right hilar and mediastinal lymph nodes are noted. Esophagus is unremarkable in appearance. No axillary lymphadenopathy.  Lungs/Pleura: Several calcified pulmonary nodules are noted in the lungs bilaterally, compatible with benign granulomas. 4 mm ground-glass attenuation nodule in the superior segment of the left lower lobe (image 21 of series 407). No other larger more suspicious appearing pulmonary nodules or masses are otherwise noted. Bilateral apical pleuroparenchymal thickening, somewhat nodular in appearance, presumably areas of post infectious or inflammatory scarring. Dependent ground-glass attenuation in the posterior aspect of the right lower lobe may reflect a focus of infection or sequela of recent mild aspiration. Mild diffuse bronchial wall thickening with mild centrilobular and paraseptal emphysema. No pleural effusions.  Musculoskeletal/Soft Tissues: There are no aggressive appearing lytic or blastic lesions noted in the visualized portions of the skeleton.  CTA ABDOMEN AND PELVIS FINDINGS  Hepatobiliary: No suspicious appearing cystic or solid hepatic lesions are noted. No intra or extrahepatic biliary ductal dilatation. Tiny calcified gallstones layer dependently in the gallbladder. No signs of acute cholecystitis at this time.  Pancreas: No pancreatic mass. No pancreatic ductal dilatation. No pancreatic or peripancreatic fluid or inflammatory changes.  Spleen: Unremarkable.  Adrenals/Urinary Tract: Bilateral adrenal glands and bilateral kidneys  are normal in appearance. No hydroureteronephrosis. Urinary bladder is normal in appearance.  Stomach/Bowel: Normal appearance of the stomach. No pathologic dilatation of small bowel or colon. Normal appendix.  Vascular/Lymphatic: Vascular findings and measurements pertinent to potential TAVR procedure, as detailed below. As described, patient is status post aortobifemoral bypass graft, which is widely patent. 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 Retroaortic left renal vein (normal anatomical variant) incidentally noted. No lymphadenopathy noted in the abdomen or pelvis.  Reproductive: Status post hysterectomy. Ovaries are not confidently identified may be surgically absent or atrophic.  Other: No significant volume of ascites. No pneumoperitoneum. Surgical clips in the left inguinal region. Immediately superficial to this there is a well-circumscribed 2.6 x 2.3 cm low-attenuation lesion which likely represents a chronic postoperative fluid collections such as a seroma; this finding is smaller than remote prior study 10/27/2010.  Musculoskeletal: Well-circumscribed lucent lesion with sclerotic margins and narrow zone of transition in the intertrochanteric region of the left femur measures approximately 2.2 cm in diameter and is very similar to remote prior study 10/27/2010, and although indeterminate, is presumably benign. There are no other aggressive appearing lytic or blastic lesions noted in the visualized portions of the skeleton.  VASCULAR MEASUREMENTS PERTINENT TO TAVR:  AORTA:  Minimal Aortic Diameter - 14 x 16 mm  Severity of Aortic Calcification - Moderate to severe in the native abdominal aorta which is completely occluded in the infrarenal region, replaced by the aortobifemoral bypass graft.  COMMENT: The patient is status post aortobifemoral bypass graft. The bypass graft is  widely patent, and the measurements indicated below reflect the portions of the graft which replace the corresponding portions of the native pelvic vessels. The native common iliac arteries are occluded bilaterally, while there is reconstituted flow in the native external iliac arteries and native  common femoral arteries bilaterally, these vessels are diminutive in size and severely diseased with atherosclerosis.  RIGHT PELVIS:  Right Common Iliac Artery -  Minimal Diameter - 9.2 x 7.6 mm  Tortuosity - minimal  Calcification - none  Right External Iliac Artery -  Minimal Diameter - 10.3 x 10.3 mm  Tortuosity - minimal  Calcification - none  Right Common Femoral Artery -  Minimal Diameter - 10.1 x 9.4 mm  Tortuosity - minimal  Calcification - none  LEFT PELVIS:  Left Common Iliac Artery -  Minimal Diameter - 10.1 x 10.1 mm  Tortuosity - mild  Calcification - none  Left External Iliac Artery -  Minimal Diameter - 10.1 x 10.9 mm  Tortuosity - minimal  Calcification - none  Left Common Femoral Artery -  Minimal Diameter - 10.4 x 11.1 mm  Tortuosity - minimal  Calcification - none  Review of the MIP images confirms the above findings.  IMPRESSION: 1. Status post aortobifemoral bypass graft placement. The bypass graft is widely patent. Assuming vascular access is acceptable via the graft rather than the native vessels, this patient does appear to have suitable pelvic arterial access, as above. 2. Severe thickening in calcifications of the aortic valve, compatible with the reported clinical history of aortic stenosis. This is associated with some concentric left ventricular hypertrophy. 3. Dependent ground-glass attenuation airspace disease in the right lower lobe likely reflects a mild infection, or sequela of recent aspiration. Clinical correlation is recommended. 4. Mild diffuse bronchial wall thickening with mild  centrilobular and paraseptal emphysema; imaging findings suggestive of underlying COPD. 5. Small resolving postoperative fluid collection in the left inguinal region is presumably a resolving seroma or old chronic hematoma. 6. Additional incidental findings, as above.  Cardiac CT 08/08/14:  Aortic Valve: Trileaflet, severely thickened, moderately calcified with severely restricted leaflet excursions.  Aorta: Normal caliber, no dissection. There are severe diffuse calcifications in the entire thoracic aorta predominantly in the aortic arch and descending thoracic aorta (no porcelain aorta).  Sinotubular Junction: 30 x 29 mm  Ascending Thoracic Aorta: 32 x 32 mm  Aortic Arch: 27 x 24 mm  Descending Thoracic Aorta: 25 x 23 mm  Sinus of Valsalva Measurements:  Non-coronary: 35 mm  Right -coronary: 35 mm  Left -coronary: 36 mm  Coronary Artery Height above Annulus:  Left Main: 14 mm  Right Coronary: 18 mm  Virtual Basal Annulus Measurements:  Maximum/Minimum Diameter: 28 x 24 mm  Perimeter: 99 mm  Area: 551 mm2  Coronary Arteries: The study suboptimal for coronary evaluation performed without use of NTG.  Optimum Fluoroscopic Angle for Delivery: LAO 10 CAU 4  IMPRESSION: 1. Severely thickened and moderately calcified aortic valve with restricted leaflet opening and annular measurements suitable for delivery of 29 mm Edward-SAPIEN 3 valve.  2. Sufficient annulus to coronary distance.  3. Optimum fluoroscopic angle for delivery LAO 10 CAU 4.  4. Severe diffuse calcifications in the entire thoracic aorta predominantly in the aortic arch and descending thoracic aorta.  EKG:  EKG is not ordered today. The ekg ordered today demonstrates   Recent Labs: 07/19/2014: BUN 16; Creat 0.84; Hemoglobin 14.5; Platelets 136*; Potassium 4.9; Sodium 140   Lipid Panel No results found for: CHOL, TRIG, HDL, CHOLHDL, VLDL, LDLCALC,  LDLDIRECT   Wt Readings from Last 3 Encounters:  08/23/14 180 lb (81.647 kg)  08/15/14 179 lb (81.194 kg)  08/01/14 179 lb (81.194 kg)     Other studies Reviewed: Additional studies/ records that  were reviewed today include: Chest/Abd/Pelvis CTA, Cardiac CT, echo, cath films, labs. Review of the above records demonstrates: see below  Assessment and Plan:   1. Severe aortic valve stenosis: She has Class D symptomatic severe aortic valve stenosis. She is having dyspnea and chest pressure with minimal exertion. Her CAD is stable by recent cardiac cath. I agree that her aortic stenosis is likely contributing to her symptoms. I agree that AVR is indicated. She is a poor candidate for conventional AVR given the extent of calcification of her thoracic aorta. She appears to be an acceptable candidate for TAVR. She has completed all of her pre-TAVR scans. She will be appropriate for a 29 mm Sapien 3 valve. Her prior aorto-bifemoral bypass surgery will limit options for trans-femoral access for TAVR.  Based on her calcified aorta, will proceed with TAVR from the trans-apical approach. She will need to see Dr. Roxy Manns over the next few weeks and at that visit, we will plan a date for her procedure. She will need to hold coumadin 5 days prior to the TAVR. It seems that she has been on coumadin for over 30 years following a stroke. I do not think she needs a Lovenox bridge while holding coumadin for her procedure. She was not bridged prior to her cath last month.   2. CAD: Stable. Recent cath. Continue medical therapy  Current medicines are reviewed at length with the patient today.  The patient does not have concerns regarding medicines.  The following changes have been made:  no change  Labs/ tests ordered today include:  No orders of the defined types were placed in this encounter.    Disposition:   FU with me one month following her TAVR procedure.  Signed, Lauree Chandler, MD 08/23/2014 11:18  AM    [facility] [place], [place], [place]  [address] Phone: [phone]; Fax: [phone]

## 2014-08-27 ENCOUNTER — Other Ambulatory Visit: Payer: Self-pay

## 2014-08-28 ENCOUNTER — Other Ambulatory Visit: Payer: Self-pay | Admitting: *Deleted

## 2014-08-28 DIAGNOSIS — I350 Nonrheumatic aortic (valve) stenosis: Secondary | ICD-10-CM

## 2014-08-29 ENCOUNTER — Encounter: Payer: Medicare Other | Admitting: Thoracic Surgery (Cardiothoracic Vascular Surgery)

## 2014-08-29 ENCOUNTER — Ambulatory Visit (HOSPITAL_COMMUNITY)
Admission: RE | Admit: 2014-08-29 | Discharge: 2014-08-29 | Disposition: A | Discharge status: Home / Self Care | Payer: Medicare Other | Source: Ambulatory Visit | Attending: Thoracic Surgery (Cardiothoracic Vascular Surgery) | Admitting: Thoracic Surgery (Cardiothoracic Vascular Surgery)

## 2014-08-29 ENCOUNTER — Emergency Department (HOSPITAL_COMMUNITY): Payer: Medicare Other

## 2014-08-29 ENCOUNTER — Other Ambulatory Visit (HOSPITAL_COMMUNITY): Payer: Self-pay

## 2014-08-29 ENCOUNTER — Encounter (HOSPITAL_COMMUNITY)
Admission: RE | Admit: 2014-08-29 | Discharge: 2014-08-29 | Disposition: A | Discharge status: Home / Self Care | Payer: Medicare Other | Source: Ambulatory Visit | Attending: Thoracic Surgery (Cardiothoracic Vascular Surgery) | Admitting: Thoracic Surgery (Cardiothoracic Vascular Surgery)

## 2014-08-29 ENCOUNTER — Other Ambulatory Visit: Payer: Self-pay | Admitting: *Deleted

## 2014-08-29 ENCOUNTER — Inpatient Hospital Stay (HOSPITAL_COMMUNITY)
Admission: EM | Admit: 2014-08-29 | Discharge: 2014-09-05 | DRG: 267 | Disposition: A | Discharge status: Home Health | Payer: Medicare Other | Attending: Cardiothoracic Surgery | Admitting: Cardiothoracic Surgery

## 2014-08-29 ENCOUNTER — Other Ambulatory Visit: Payer: Self-pay

## 2014-08-29 ENCOUNTER — Encounter (HOSPITAL_COMMUNITY): Payer: Self-pay | Admitting: Emergency Medicine

## 2014-08-29 ENCOUNTER — Encounter (HOSPITAL_COMMUNITY): Payer: Self-pay

## 2014-08-29 VITALS — BP 178/94 | HR 89 | Temp 98.3°F | Resp 20 | Ht 70.0 in | Wt 179.2 lb

## 2014-08-29 DIAGNOSIS — Z952 Presence of prosthetic heart valve: Secondary | ICD-10-CM

## 2014-08-29 DIAGNOSIS — I2510 Atherosclerotic heart disease of native coronary artery without angina pectoris: Secondary | ICD-10-CM | POA: Diagnosis present

## 2014-08-29 DIAGNOSIS — Z7901 Long term (current) use of anticoagulants: Secondary | ICD-10-CM | POA: Diagnosis not present

## 2014-08-29 DIAGNOSIS — Z01812 Encounter for preprocedural laboratory examination: Secondary | ICD-10-CM

## 2014-08-29 DIAGNOSIS — I700 Atherosclerosis of aorta: Secondary | ICD-10-CM | POA: Diagnosis present

## 2014-08-29 DIAGNOSIS — I4891 Unspecified atrial fibrillation: Secondary | ICD-10-CM | POA: Diagnosis not present

## 2014-08-29 DIAGNOSIS — D6959 Other secondary thrombocytopenia: Secondary | ICD-10-CM | POA: Diagnosis not present

## 2014-08-29 DIAGNOSIS — Z91040 Latex allergy status: Secondary | ICD-10-CM

## 2014-08-29 DIAGNOSIS — Z888 Allergy status to other drugs, medicaments and biological substances status: Secondary | ICD-10-CM

## 2014-08-29 DIAGNOSIS — I272 Other secondary pulmonary hypertension: Secondary | ICD-10-CM | POA: Diagnosis present

## 2014-08-29 DIAGNOSIS — Z953 Presence of xenogenic heart valve: Secondary | ICD-10-CM

## 2014-08-29 DIAGNOSIS — R011 Cardiac murmur, unspecified: Secondary | ICD-10-CM | POA: Diagnosis present

## 2014-08-29 DIAGNOSIS — I352 Nonrheumatic aortic (valve) stenosis with insufficiency: Secondary | ICD-10-CM

## 2014-08-29 DIAGNOSIS — Z0183 Encounter for blood typing: Secondary | ICD-10-CM | POA: Insufficient documentation

## 2014-08-29 DIAGNOSIS — I350 Nonrheumatic aortic (valve) stenosis: Secondary | ICD-10-CM

## 2014-08-29 DIAGNOSIS — Z8673 Personal history of transient ischemic attack (TIA), and cerebral infarction without residual deficits: Secondary | ICD-10-CM | POA: Insufficient documentation

## 2014-08-29 DIAGNOSIS — E782 Mixed hyperlipidemia: Secondary | ICD-10-CM | POA: Diagnosis present

## 2014-08-29 DIAGNOSIS — Z01810 Encounter for preprocedural cardiovascular examination: Secondary | ICD-10-CM

## 2014-08-29 DIAGNOSIS — Z01818 Encounter for other preprocedural examination: Secondary | ICD-10-CM | POA: Insufficient documentation

## 2014-08-29 DIAGNOSIS — I447 Left bundle-branch block, unspecified: Secondary | ICD-10-CM

## 2014-08-29 DIAGNOSIS — I495 Sick sinus syndrome: Secondary | ICD-10-CM | POA: Diagnosis present

## 2014-08-29 DIAGNOSIS — Z91048 Other nonmedicinal substance allergy status: Secondary | ICD-10-CM | POA: Diagnosis not present

## 2014-08-29 DIAGNOSIS — Z87891 Personal history of nicotine dependence: Secondary | ICD-10-CM

## 2014-08-29 DIAGNOSIS — E785 Hyperlipidemia, unspecified: Secondary | ICD-10-CM | POA: Diagnosis present

## 2014-08-29 DIAGNOSIS — I10 Essential (primary) hypertension: Secondary | ICD-10-CM

## 2014-08-29 DIAGNOSIS — J439 Emphysema, unspecified: Secondary | ICD-10-CM

## 2014-08-29 DIAGNOSIS — Z954 Presence of other heart-valve replacement: Secondary | ICD-10-CM | POA: Diagnosis not present

## 2014-08-29 DIAGNOSIS — J42 Unspecified chronic bronchitis: Secondary | ICD-10-CM | POA: Diagnosis present

## 2014-08-29 DIAGNOSIS — M19011 Primary osteoarthritis, right shoulder: Secondary | ICD-10-CM | POA: Insufficient documentation

## 2014-08-29 DIAGNOSIS — I4819 Other persistent atrial fibrillation: Secondary | ICD-10-CM | POA: Diagnosis present

## 2014-08-29 DIAGNOSIS — Z006 Encounter for examination for normal comparison and control in clinical research program: Secondary | ICD-10-CM | POA: Diagnosis not present

## 2014-08-29 DIAGNOSIS — K219 Gastro-esophageal reflux disease without esophagitis: Secondary | ICD-10-CM | POA: Diagnosis present

## 2014-08-29 DIAGNOSIS — Z79899 Other long term (current) drug therapy: Secondary | ICD-10-CM

## 2014-08-29 DIAGNOSIS — I480 Paroxysmal atrial fibrillation: Secondary | ICD-10-CM | POA: Diagnosis not present

## 2014-08-29 DIAGNOSIS — I739 Peripheral vascular disease, unspecified: Secondary | ICD-10-CM | POA: Diagnosis present

## 2014-08-29 HISTORY — DX: Unspecified osteoarthritis, unspecified site: M19.90

## 2014-08-29 HISTORY — DX: Presence of prosthetic heart valve: Z95.2

## 2014-08-29 HISTORY — DX: Nonrheumatic aortic (valve) stenosis: I35.0

## 2014-08-29 HISTORY — DX: Gastro-esophageal reflux disease without esophagitis: K21.9

## 2014-08-29 HISTORY — DX: Unspecified asthma, uncomplicated: J45.909

## 2014-08-29 HISTORY — DX: Unspecified chronic bronchitis: J42

## 2014-08-29 LAB — COMPREHENSIVE METABOLIC PANEL
ALT: 18 U/L (ref 14–54)
ANION GAP: 9 (ref 5–15)
AST: 27 U/L (ref 15–41)
Albumin: 3.8 g/dL (ref 3.5–5.0)
Alkaline Phosphatase: 68 U/L (ref 38–126)
BUN: 17 mg/dL (ref 6–20)
CALCIUM: 8.8 mg/dL — AB (ref 8.9–10.3)
CO2: 19 mmol/L — AB (ref 22–32)
CREATININE: 0.88 mg/dL (ref 0.44–1.00)
Chloride: 109 mmol/L (ref 101–111)
GFR calc non Af Amer: >60 mL/min (ref >60)
GLUCOSE: 87 mg/dL (ref 65–99)
Potassium: 4.6 mmol/L (ref 3.5–5.1)
Sodium: 137 mmol/L (ref 135–145)
TOTAL PROTEIN: 6.6 g/dL (ref 6.5–8.1)
Total Bilirubin: 0.5 mg/dL (ref 0.3–1.2)

## 2014-08-29 LAB — APTT: aPTT: 33 seconds (ref 24–37)

## 2014-08-29 LAB — CBC
HCT: 44 % (ref 36.0–46.0)
HCT: 48.5 % — ABNORMAL HIGH (ref 36.0–46.0)
HEMOGLOBIN: 14.8 g/dL (ref 12.0–15.0)
Hemoglobin: 16.3 g/dL — ABNORMAL HIGH (ref 12.0–15.0)
MCH: 31 pg (ref 26.0–34.0)
MCH: 31 pg (ref 26.0–34.0)
MCHC: 33.6 g/dL (ref 30.0–36.0)
MCHC: 33.6 g/dL (ref 30.0–36.0)
MCV: 92.1 fL (ref 78.0–100.0)
MCV: 92.2 fL (ref 78.0–100.0)
PLATELETS: 115 10*3/uL — AB (ref 150–400)
Platelets: 113 10*3/uL — ABNORMAL LOW (ref 150–400)
RBC: 4.78 MIL/uL (ref 3.87–5.11)
RBC: 5.26 MIL/uL — AB (ref 3.87–5.11)
RDW: 14.4 % (ref 11.5–15.5)
RDW: 14.5 % (ref 11.5–15.5)
WBC: 6.1 10*3/uL (ref 4.0–10.5)
WBC: 6.2 10*3/uL (ref 4.0–10.5)

## 2014-08-29 LAB — URINALYSIS, ROUTINE W REFLEX MICROSCOPIC
Bilirubin Urine: NEGATIVE
Glucose, UA: NEGATIVE mg/dL
Ketones, ur: NEGATIVE mg/dL
Leukocytes, UA: NEGATIVE
NITRITE: NEGATIVE
PROTEIN: NEGATIVE mg/dL
Specific Gravity, Urine: 1.021 (ref 1.005–1.030)
Urobilinogen, UA: 0.2 mg/dL (ref 0.0–1.0)
pH: 5 (ref 5.0–8.0)

## 2014-08-29 LAB — BASIC METABOLIC PANEL
Anion gap: 7 (ref 5–15)
BUN: 16 mg/dL (ref 6–20)
CALCIUM: 9.1 mg/dL (ref 8.9–10.3)
CO2: 24 mmol/L (ref 22–32)
CREATININE: 0.95 mg/dL (ref 0.44–1.00)
Chloride: 108 mmol/L (ref 101–111)
GFR calc Af Amer: >60 mL/min (ref >60)
GFR, EST NON AFRICAN AMERICAN: 59 mL/min — AB (ref >60)
GLUCOSE: 97 mg/dL (ref 65–99)
Potassium: 4.4 mmol/L (ref 3.5–5.1)
Sodium: 139 mmol/L (ref 135–145)

## 2014-08-29 LAB — PROTIME-INR
INR: 2.44 — AB (ref 0.00–1.49)
INR: 2.54 — AB (ref 0.00–1.49)
PROTHROMBIN TIME: 26.2 s — AB (ref 11.6–15.2)
Prothrombin Time: 27 seconds — ABNORMAL HIGH (ref 11.6–15.2)

## 2014-08-29 LAB — BLOOD GAS, ARTERIAL
Acid-base deficit: 3.8 mmol/L — ABNORMAL HIGH (ref 0.0–2.0)
Bicarbonate: 20.4 mEq/L (ref 20.0–24.0)
DRAWN BY: 428831
FIO2: 0.21 %
O2 SAT: 98.1 %
Patient temperature: 98.6
TCO2: 21.5 mmol/L (ref 0–100)
pCO2 arterial: 35.2 mmHg (ref 35.0–45.0)
pH, Arterial: 7.381 (ref 7.350–7.450)
pO2, Arterial: 106 mmHg — ABNORMAL HIGH (ref 80.0–100.0)

## 2014-08-29 LAB — I-STAT TROPONIN, ED: TROPONIN I, POC: 0.01 ng/mL (ref 0.00–0.08)

## 2014-08-29 LAB — URINE MICROSCOPIC-ADD ON

## 2014-08-29 LAB — TYPE AND SCREEN
ABO/RH(D): B POS
ANTIBODY SCREEN: NEGATIVE

## 2014-08-29 LAB — SURGICAL PCR SCREEN
MRSA, PCR: NEGATIVE
STAPHYLOCOCCUS AUREUS: NEGATIVE

## 2014-08-29 LAB — TSH: TSH: 2.106 u[IU]/mL (ref 0.350–4.500)

## 2014-08-29 MED ORDER — PHYTONADIONE 5 MG PO TABS
10.0000 mg | ORAL_TABLET | Freq: Once | ORAL | Status: AC
  Administered 2014-08-30: 10 mg via ORAL
  Filled 2014-08-29: qty 2

## 2014-08-29 MED ORDER — DILTIAZEM LOAD VIA INFUSION
10.0000 mg | Freq: Once | INTRAVENOUS | Status: AC
  Administered 2014-08-29: 10 mg via INTRAVENOUS

## 2014-08-29 MED ORDER — METOPROLOL SUCCINATE ER 25 MG PO TB24
25.0000 mg | ORAL_TABLET | Freq: Every day | ORAL | Status: DC
  Administered 2014-08-30 – 2014-08-31 (×2): 25 mg via ORAL
  Filled 2014-08-29 (×2): qty 1

## 2014-08-29 MED ORDER — ESTROGENS CONJUGATED 1.25 MG PO TABS
1.2500 mg | ORAL_TABLET | Freq: Every day | ORAL | Status: DC
  Administered 2014-08-30 – 2014-09-05 (×7): 1.25 mg via ORAL
  Filled 2014-08-29 (×7): qty 1

## 2014-08-29 MED ORDER — PHYTONADIONE 5 MG PO TABS
10.0000 mg | ORAL_TABLET | Freq: Once | ORAL | Status: AC
  Administered 2014-08-29: 10 mg via ORAL
  Filled 2014-08-29: qty 2

## 2014-08-29 MED ORDER — FUROSEMIDE 40 MG PO TABS: 20.0000 mg | ORAL_TABLET | Freq: Every day | ORAL | Status: DC | PRN

## 2014-08-29 MED ORDER — DILTIAZEM HCL 100 MG IV SOLR
5.0000 mg/h | INTRAVENOUS | Status: DC
  Administered 2014-08-29: 5 mg/h via INTRAVENOUS

## 2014-08-29 MED ORDER — POTASSIUM CHLORIDE ER 10 MEQ PO TBCR: 10.0000 meq | EXTENDED_RELEASE_TABLET | ORAL | Status: DC | PRN

## 2014-08-29 MED ORDER — DILTIAZEM HCL 100 MG IV SOLR
10.0000 mg/h | INTRAVENOUS | Status: DC
  Administered 2014-08-29 – 2014-08-31 (×2): 2.5 mg/h via INTRAVENOUS
  Filled 2014-08-29: qty 100

## 2014-08-29 MED ORDER — ROSUVASTATIN CALCIUM 20 MG PO TABS
20.0000 mg | ORAL_TABLET | Freq: Every day | ORAL | Status: DC
  Administered 2014-08-29 – 2014-09-04 (×6): 20 mg via ORAL
  Filled 2014-08-29: qty 1
  Filled 2014-08-29: qty 2
  Filled 2014-08-29 (×5): qty 1
  Filled 2014-08-29: qty 2

## 2014-08-29 MED ORDER — POTASSIUM CHLORIDE CRYS ER 20 MEQ PO TBCR: 10.0000 meq | EXTENDED_RELEASE_TABLET | Freq: Every day | ORAL | Status: DC | PRN

## 2014-08-29 NOTE — ED Notes (Signed)
Chest xray at bedside.

## 2014-08-29 NOTE — Progress Notes (Signed)
ANTICOAGULATION CONSULT NOTE - Initial Consult  Pharmacy Consult for Heparin Indication: atrial fibrillation  Allergies  Allergen Reactions  . Adhesive [Tape] Rash  . Latex Rash  . Tetanus Toxoids Rash    Patient Measurements:   Heparin Dosing Weight: 81.3 kg  Vital Signs: Temp: 98.3 F (36.8 C) (06/29 0936) BP: 141/80 mmHg (06/29 1430) Pulse Rate: 77 (06/29 1430)  Labs:  Recent Labs  08/29/14 1147 08/29/14 1250  HGB 14.8 16.3*  HCT 44.0 48.5*  PLT 113* 115*  APTT 33  --   LABPROT 26.2* 27.0*  INR 2.44* 2.54*  CREATININE 0.88 0.95    Estimated Creatinine Clearance: 58.7 mL/min (by C-G formula based on Cr of 0.95).   Medical History: Past Medical History  Diagnosis Date  . Hypertension   . Claudication   . Heart murmur   . Muscle pain   . Seroma, postoperative     Left Groin  . Leg pain   . Coronary artery disease   . 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 Peripheral vascular disease   . Glaucoma     early stage  . Shortness of breath dyspnea   . Aortic stenosis, severe   . GERD (gastroesophageal reflux disease)   . Headache     PMH  . Arthritis     Medications:   (Not in a hospital admission) Scheduled:   Infusions:  . diltiazem (CARDIZEM) infusion 10 mg/hr (08/29/14 1422)    Assessment: 74yo female with history of CAD, HTN, HLD, PAD and prior CVA presents in Afib. Pharmacy is consulted to dose heparin for atrial fibrillation when INR < 2.0. Hgb 16.3, Plt 115, sCr 0.95, INR 2.54.  Goal of Therapy:  Heparin level 0.3-0.7 units/ml Monitor platelets by anticoagulation protocol: Yes   Plan:  Will start heparin when INR < 2.0 Daily INR Continue to monitor H&H and platelets  Andrey Cota. Diona Foley, PharmD Clinical Pharmacist Pager [phone] 08/29/2014,3:04 PM

## 2014-08-29 NOTE — Pre-Procedure Instructions (Signed)
KATIEANN HUNGATE  08/29/2014      [facility] - [place], [place] - [address] [place] [address] Phone: [phone] Fax: [phone]  [id number] [place], [place] - [place] [address] [place] [address] Phone: [phone] Fax: [phone]    Your procedure is scheduled on Friday, August 31, 2014  Report to [facility] Admitting at 10:30 A.M.  Call this number if you have problems the morning of surgery:  [phone]   Remember: Bring your Incentive Spirometer ( breathing device) and cardiac education book with you on day of admission and ointment ( only if required ).   Do not eat food or drink liquids after midnight Thursday, August 30, 2014  Take these medicines the morning of surgery with A SIP OF WATER :None  Stop taking vitamins  and herbal medications. Do not take any NSAIDs ie: Ibuprofen, Advil, Naproxen or meloxicam (MOBIC)    Do not wear jewelry, make-up or nail polish.  Do not wear lotions, powders, or perfumes.  You may not  wear deodorant.  Do not shave 48 hours prior to surgery.    Do not bring valuables to the hospital.  [facility] is not responsible for any belongings or valuables.  Contacts, dentures or bridgework may not be worn into surgery.  Leave your suitcase in the car.  After surgery it may be brought to your room.  For patients admitted to the hospital, discharge time will be determined by your treatment team.  Patients discharged the day of surgery will not be allowed to drive home.   Name and phone number of your driver:    Special instructions:   Special Instructions:Special Instructions: [facility] - Preparing for Surgery  Before surgery, you can play an important role.  Because skin is not sterile, your skin needs to be as free of germs as possible.  You can reduce the number of germs on you skin by washing with CHG (chlorahexidine gluconate) soap  before surgery.  CHG is an antiseptic cleaner which kills germs and bonds with the skin to continue killing germs even after washing.  Please DO NOT use if you have an allergy to CHG or antibacterial soaps.  If your skin becomes reddened/irritated stop using the CHG and inform your nurse when you arrive at Short Stay.  Do not shave (including legs and underarms) for at least 48 hours prior to the first CHG shower.  You may shave your face.  Please follow these instructions carefully:   1.  Shower with CHG Soap the night before surgery and the morning of Surgery.  2.  If you choose to wash your hair, wash your hair first as usual with your normal shampoo.  3.  After you shampoo, rinse your hair and body thoroughly to remove the Shampoo.  4.  Use CHG as you would any other liquid soap.  You can apply chg directly  to the skin and wash gently with scrungie or a clean washcloth.  5.  Apply the CHG Soap to your body ONLY FROM THE NECK DOWN.  Do not use on open wounds or open sores.  Avoid contact with your eyes, ears, mouth and genitals (private parts).  Wash genitals (private parts) with your normal soap.  6.  Wash thoroughly, paying special attention to the area where your surgery will be performed.  7.  Thoroughly rinse your body with warm water from  the neck down.  8.  DO NOT shower/wash with your normal soap after using and rinsing off the CHG Soap.  9.  Pat yourself dry with a clean towel.            10.  Wear clean pajamas.            11.  Place clean sheets on your bed the night of your first shower and do not sleep with pets.  Day of Surgery  Do not apply any lotions/deodorants the morning of surgery.  Please wear clean clothes to the hospital/surgery center.  Please read over the following fact sheets that you were given. Pain Booklet, Coughing and Deep Breathing, Blood Transfusion Information, Open Heart Packet, MRSA Information and Surgical Site Infection Prevention

## 2014-08-29 NOTE — ED Notes (Addendum)
Cards MD at bedside

## 2014-08-29 NOTE — ED Notes (Signed)
Cardiologist at bedside MD at bedside.

## 2014-08-29 NOTE — Progress Notes (Signed)
Pt denies chest pain but is under the care of Dr.Kelly, cardiology. Pt stated that she takes all of her medications at night. Pt stated that her last dose of Coumadin was Wednesday, 08/27/14. Pt assessed by Ebony Hail, [place] and sent to ED for new onset of symptomatic afib.

## 2014-08-29 NOTE — Consult Note (Signed)
[address]       [place] [address]             [phone]          CARDIOTHORACIC SURGERY CONSULTATION REPORT  PCP is Lanette Hampshire, MD Referring Provider is Gaye Pollack, MD Primary Cardiologist is Troy Sine, MD  Reason for consultation:  Severe aortic stenosis  HPI:  Patient is a 74 year old female with severe symptomatic aortic stenosis, coronary artery disease, hypertension, previous stroke on chronic warfarin therapy, and peripheral arterial disease who has been referred for a second surgical opinion to discuss treatment options for management of stage D severe symptomatic aortic stenosis.  The patient has known history of heart murmur for which she has been followed for several years by Dr. Claiborne Billings.  Transthoracic echocardiograms have demonstrated aortic stenosis which has gradually progressed over several years of follow up.  Echocardiogram performed 02/12/2014 revealed severe aortic stenosis with peak velocity across the aortic valve measured greater than 4 m/s corresponding to a mean transvalvular gradient of 42 mmHg. Left ventricular systolic function remained preserved with ejection fraction estimated 55-60%.  The patient was seen in follow-up by Dr. Claiborne Billings on 04/20/2014. At that time the patient claimed to remain asymptomatic.  Close follow-up was planned.  Over the next few months the patient developed progressive symptoms of exertional chest tightness and shortness of breath. She was seen in follow-up in early May and subsequently scheduled for diagnostic cardiac catheterization.  Cardiac catheterization performed 07/25/2014 revealed multivessel coronary artery disease with 100% chronic occlusion of the mid right coronary artery, 70% proximal stenosis of a large diagonal branch, and 50% stenosis of the left circumflex coronary artery. Peak to peak and mean transvalvular gradients measured across the aortic valve at catheterization measured 46 and  39.5 mmHg, respectively. Aortic valve area was calculated at 0.64 cm. Left ventricular function remained preserved with ejection fraction estimated 55%. The patient was referred for surgical consultation and evaluated by Dr. Cyndia Bent in early June. He noted that the patient had severe calcification involving the entire proximal ascending thoracic aorta and felt that the patient would be at very high risk for conventional surgical aortic valve replacement with or without coronary artery bypass grafting. Transcatheter aortic valve replacement was discussed as an alternative therapy and the patient has subsequently undergone CT angiography and been evaluated by a multidisciplinary team of specialists including Dr. Angelena Form from interventional cardiology.  The patient underwent numerous routine preoperative diagnostic tests as an outpatient earlier today. However, the patient developed rapid atrial fibrillation associated with near syncopal event and was subsequently admitted to the hospital.  Cardiothoracic surgical consultation was requested.  The patient is married and lives in [place] with her husband.  She has been reasonably active and functionally independent all of her life. Her mobility is limited somewhat by moderate chronic pain in her lower back and hip related to degenerative arthritis. However, she is fully ambulatory and requires no mechanical assistance. She drives an automobile and tends to her own personal daily needs. She describes a several month history of progressive symptoms of exertional shortness of breath and chest tightness. She recently has developed intermittent tachycardia palpitations associated with dizzy spells and near syncope.  She denies any history resting shortness of breath, PND, orthopnea. She has had mild lower extremity edema.  Past Medical History  Diagnosis Date  . Hypertension   . Claudication   . Heart murmur   . Muscle pain   .  Seroma, postoperative     Left  Groin  . Leg pain   . Coronary artery disease   . Peripheral vascular disease   . Glaucoma     early stage  . Shortness of breath dyspnea   . Aortic stenosis, severe   . GERD (gastroesophageal reflux disease)   . Headache     PMH  . Childhood asthma   . Emphysema of lung     "the beginning; slight" (08/29/2014)  . Chronic bronchitis     "usually q yr; haven't had it in the last couple years" (08/29/2014)  . Arthritis     "hips; spine" (08/29/2014)  . Stroke 1982 X 2    "a little bit weaker on the left side since" (08/29/2014)    Past Surgical History  Procedure Laterality Date  . Aorto-femoral bypass graft Bilateral 06/27/10  . Vaginal hysterectomy  1970's    Partial   . Pr vein bypass graft,aorto-fem-pop  06/12/10  . Cardiac catheterization N/A 07/25/2014    Procedure: Right/Left Heart Cath and Coronary Angiography;  Surgeon: Troy Sine, MD;  Location: [facility];  Service: Cardiovascular;  Laterality: N/A;  . Colonoscopy    . Multiple tooth extractions    . Dilation and curettage of uterus  "2 or 3"  . Tubal ligation  1970's  . Basal cell carcinoma excision Left     face    Family History  Problem Relation Age of Onset  . Heart disease Mother   . Varicose Veins Mother   . Stroke Father   . Hypertension Father   . Heart disease Father     Aneurysm  . Heart attack Father   . Arthritis Brother   . Diabetes Daughter     History   Social History  . Marital Status: Married    Spouse Name: N/A  . Number of Children: N/A  . Years of Education: N/A   Occupational History  . Not on file.   Social History Main Topics  . Smoking status: Former Smoker -- 2.00 packs/day for 56 years    Types: Cigarettes    Quit date: 06/01/2010  . Smokeless tobacco: Never Used     Comment: HAD WEANED DOWN TO 1/4 PK A DAY BEFORE D/C  . Alcohol Use: 0.0 oz/week    0 Standard drinks or equivalent per week     Comment: 08/29/2014 "never drank much; quit in the 1990's"  .  Drug Use: No  . Sexual Activity: Yes   Other Topics Concern  . Not on file   Social History Narrative    Prior to Admission medications   Medication Sig Start Date End Date Taking? Authorizing Provider  cetirizine (ZYRTEC) 10 MG tablet Take 10 mg by mouth daily.     Yes Historical Provider, MD  estrogens, conjugated, (PREMARIN) 1.25 MG tablet Take 1.25 mg by mouth daily.     Yes Historical Provider, MD  folic acid-pyridoxine-cyancobalamin (FOLBIC) 2.5-25-2 MG TABS Take 1 tablet by mouth daily. 05/07/14  Yes Troy Sine, MD  furosemide (LASIX) 20 MG tablet Take 20 mg by mouth as needed (fluid retention).    Yes Historical Provider, MD  meloxicam (MOBIC) 15 MG tablet Take 15 mg by mouth daily.     Yes Historical Provider, MD  metoprolol succinate (TOPROL-XL) 25 MG 24 hr tablet Take 1 tablet (25 mg total) by mouth daily. 07/09/14  Yes Troy Sine, MD  potassium chloride (KLOR-CON) 10 MEQ CR tablet Take  10 mEq by mouth as needed (when taking Lasix).    Yes Historical Provider, MD  rosuvastatin (CRESTOR) 20 MG tablet Take 20 mg by mouth daily.     Yes Historical Provider, MD  valsartan (DIOVAN) 80 MG tablet Take 1 tablet (80 mg total) by mouth daily. 02/06/14  Yes Troy Sine, MD  warfarin (COUMADIN) 3 MG tablet Take 3 mg by mouth daily at 6 PM.    Yes Historical Provider, MD    Current Facility-Administered Medications  Medication Dose Route Frequency Provider Last Rate Last Dose  . diltiazem (CARDIZEM) 100 mg in dextrose 5 % 100 mL (1 mg/mL) infusion  10 mg/hr Intravenous Titrated Brett Canales, PA-C 2.5 mL/hr at 08/29/14 1740 2.5 mg/hr at 08/29/14 1740  . [START ON 08/30/2014] estrogens (conjugated) (PREMARIN) tablet 1.25 mg  1.25 mg Oral Daily Brett Canales, PA-C      . furosemide (LASIX) tablet 20 mg  20 mg Oral Daily PRN Brett Canales, PA-C      . [START ON 08/30/2014] metoprolol succinate (TOPROL-XL) 24 hr tablet 25 mg  25 mg Oral Daily Bryan W Hager, PA-C      . phytonadione  (VITAMIN K) tablet 10 mg  10 mg Oral Once Rexene Alberts, MD      . Derrill Memo ON 08/30/2014] phytonadione (VITAMIN K) tablet 10 mg  10 mg Oral Once Rexene Alberts, MD      . potassium chloride (K-DUR,KLOR-CON) CR tablet 10 mEq  10 mEq Oral Daily PRN Troy Sine, MD      . rosuvastatin (CRESTOR) tablet 20 mg  20 mg Oral q1800 Brett Canales, PA-C   20 mg at 08/29/14 1811    Allergies  Allergen Reactions  . Adhesive [Tape] Rash  . Latex Rash  . Tetanus Toxoids Rash      Review of Systems:   General:  normal appetite, decreased energy, no weight gain, no weight loss, no fever  Cardiac:  + chest pain with exertion, no chest pain at rest, + SOB with exertion, no resting SOB, no PND, no orthopnea, + palpitations, + arrhythmia, + atrial fibrillation, + LE edema, + dizzy spells, + near syncope  Respiratory:  + shortness of breath, no home oxygen, no productive cough, no dry cough, no bronchitis, no wheezing, no hemoptysis, no asthma, no pain with inspiration or cough, no sleep apnea, no CPAP at night  GI:   + difficulty swallowing, no reflux, no frequent heartburn, no hiatal hernia, no abdominal pain, no constipation, no diarrhea, no hematochezia, no hematemesis, no melena  GU:   no dysuria,  no frequency, no urinary tract infection, no hematuria, no kidney stones, no kidney disease  Vascular:  no pain suggestive of claudication, no pain in feet, no leg cramps, no varicose veins, no DVT, no non-healing foot ulcer  Neuro:   + stroke in 1982, no TIA's, no seizures, no headaches, no temporary blindness one eye,  no slurred speech, + peripheral neuropathy, + mild chronic pain, no instability of gait, no memory/cognitive dysfunction  Musculoskeletal: + arthritis - primarily involving the lower back and hip, no joint swelling, no myalgias, no difficulty walking, normal mobility   Skin:   no rash, no itching, no skin infections, no pressure sores or ulcerations  Psych:   no anxiety, no depression, no  nervousness, no unusual recent stress  Eyes:   no blurry vision, no floaters, no recent vision changes, + wears glasses or contacts  ENT:  no hearing loss, no loose or painful teeth, + dentures, last saw dentist within the past year  Hematologic:  + easy bruising, no abnormal bleeding, no clotting disorder, no frequent epistaxis, no problems on long term warfarin  Endocrine:  no diabetes, does not check CBG's at home     Physical Exam:   BP 139/58 mmHg  Pulse 53  Temp(Src) 97.8 F (36.6 C) (Oral)  Resp 15  Ht '5\' 10"'$  (1.778 m)  SpO2 99%  General:    well-appearing  HEENT:  Unremarkable   Neck:   no JVD, no bruits, no adenopathy   Chest:   clear to auscultation, symmetrical breath sounds, no wheezes, no rhonchi   CV:   RRR, grade III/VI crescendo/decrescendo systolic murmur   Abdomen:  soft, non-tender, no masses   Extremities:  warm, well-perfused, pulses palpable, trace lower extremity edema  Rectal/GU  Deferred  Neuro:   Grossly non-focal and symmetrical throughout  Skin:   Clean and dry, no rashes, no breakdown  Diagnostic Tests:  Transthoracic Echocardiography  Patient:  Katelyn [name], Katelyn [name] MR #:    [id number] Study Date: 02/12/2014 Gender:   F Age:    63 Height:   177.8 cm Weight:   82.6 kg BSA:    2.03 m^2 Pt. Status: Room:  ORDERING   Shelva Majestic, M.D. REFERRING  Shelva Majestic, M.D. ATTENDING  Kirk Ruths SONOGRAPHER Marygrace Drought, RCS PERFORMING  [facility], Outpatient  cc:  ------------------------------------------------------------------- LV EF: 55% -  60%  ------------------------------------------------------------------- Indications:   424.1 Aortic valve disorders.  ------------------------------------------------------------------- History:  PMH: Claudication. Murmur. Coronary artery disease. Risk factors: Hypertension.  ------------------------------------------------------------------- Study Conclusions  -  Left ventricle: The cavity size was normal. Wall thickness was increased in a pattern of moderate LVH. Systolic function was normal. The estimated ejection fraction was in the range of 55% to 60%. Features are consistent with a pseudonormal left ventricular filling pattern, with concomitant abnormal relaxation and increased filling pressure (grade 2 diastolic dysfunction). E/medial e&' > 15, suggesting LV end diastolic pressure at least 20 mmHg. - Aortic valve: Trileaflet; severely calcified leaflets. There was severe stenosis. There was mild regurgitation. Mean gradient (S): 42 mm Hg. Peak gradient (S): 64 mm Hg. Valve area (VTI): 0.7 cm^2. - Mitral valve: Moderately calcified annulus. Mildly calcified leaflets . There was trivial regurgitation. - Left atrium: The atrium was mildly dilated. - Right ventricle: The cavity size was normal. Systolic function was normal. - Tricuspid valve: Peak RV-RA gradient (S): 41 mm Hg. - Pulmonary arteries: PA peak pressure: 44 mm Hg (S). - Inferior vena cava: The vessel was normal in size. The respirophasic diameter changes were in the normal range (= 50%), consistent with normal central venous pressure.  Impressions:  - Normal LV size with moderate LV hypertrophy, EF 55-60%. Moderate diastolic dysfunction with evidence for elevated LV filling pressure. Normal RV size and systolic function. Severe aortic stenosis with mild aortic insufficiency. Trileaflet valve. Mild pulmonary hypertension.  Transthoracic echocardiography. M-mode, complete 2D, spectral Doppler, and color Doppler. Birthdate: Patient birthdate: 06/26/1940. Age: Patient is 74 yr old. Sex: Gender: female. BMI: 26.1 kg/m^2. Blood pressure:   171/71 Patient status: Outpatient. Study date: Study date: 02/12/2014. Study time: 10:15 AM. Location: Echo  laboratory.  -------------------------------------------------------------------  ------------------------------------------------------------------- Left ventricle: The cavity size was normal. Wall thickness was increased in a pattern of moderate LVH. Systolic function was normal. The estimated ejection fraction was in the range of 55% to 60%. Features are consistent with a pseudonormal left ventricular  filling pattern, with concomitant abnormal relaxation and increased filling pressure (grade 2 diastolic dysfunction). E/medial e&' > 15, suggesting LV end diastolic pressure at least 20 mmHg.  ------------------------------------------------------------------- Aortic valve:  Trileaflet; severely calcified leaflets. Doppler: There was severe stenosis.  There was mild regurgitation.  VTI ratio of LVOT to aortic valve: 0.2. Valve area (VTI): 0.7 cm^2. Indexed valve area (VTI): 0.34 cm^2/m^2. Indexed valve area (Vmax): 0.37 cm^2/m^2. Mean velocity ratio of LVOT to aortic valve: 0.21. Indexed valve area (Vmean): 0.35 cm^2/m^2.  Mean gradient (S): 42 mm Hg. Peak gradient (S): 64 mm Hg.  ------------------------------------------------------------------- Aorta: Aortic root: The aortic root was normal in size. Ascending aorta: The ascending aorta was normal in size.  ------------------------------------------------------------------- Mitral valve:  Moderately calcified annulus. Mildly calcified leaflets . Doppler:  There was no evidence for stenosis.  There was trivial regurgitation.  Peak gradient (D): 7 mm Hg.  ------------------------------------------------------------------- Left atrium: LA volume/ BSA = 32.3 ml/m2. The atrium was mildly dilated.  ------------------------------------------------------------------- Right ventricle: The cavity size was normal. Systolic function  was normal.  ------------------------------------------------------------------- Pulmonic valve:  Structurally normal valve.  Cusp separation was normal. Doppler: Transvalvular velocity was within the normal range. There was trivial regurgitation.  ------------------------------------------------------------------- Tricuspid valve:  Doppler: There was trivial regurgitation.  ------------------------------------------------------------------- Right atrium: The atrium was normal in size.  ------------------------------------------------------------------- Pericardium: There was no pericardial effusion.  ------------------------------------------------------------------- Systemic veins: Inferior vena cava: The vessel was normal in size. The respirophasic diameter changes were in the normal range (= 50%), consistent with normal central venous pressure. Diameter: 12 mm.  ------------------------------------------------------------------- Measurements  IVC                    Value     Reference ID                    12  mm    ---------  Left ventricle              Value     Reference LV ID, ED, PLAX chordal          45.6 mm    43 - 52 LV ID, ES, PLAX chordal          32.5 mm    23 - 38 LV fx shortening, PLAX chordal      29  %    >=29 LV PW thickness, ED            13.4 mm    --------- IVS/LV PW ratio, ED            1.22      <=1.3 Stroke volume, 2D             81  ml    --------- Stroke volume/bsa, 2D           40  ml/m^2  --------- LV e&', lateral              8.22 cm/s   --------- LV E/e&', lateral             15.94     --------- LV e&', medial               2.74 cm/s   --------- LV E/e&', medial              47.81      --------- LV e&', average              5.48 cm/s   --------- LV  E/e&', average             23.91     ---------  Ventricular septum            Value     Reference IVS thickness, ED             16.4 mm    ---------  LVOT                   Value     Reference LVOT ID, S                21  mm    --------- LVOT area                 3.46 cm^2   --------- LVOT peak velocity, S           86.7 cm/s   --------- LVOT mean velocity, S           63.2 cm/s   --------- LVOT VTI, S                23.5 cm    ---------  Aortic valve               Value     Reference Aortic valve mean velocity, S       305  cm/s   --------- Aortic valve VTI, S            116  cm    --------- Aortic mean gradient, S          41  mm Hg  --------- Aortic peak gradient, S          64  mm Hg  --------- VTI ratio, LVOT/AV            0.2      --------- Aortic valve area, VTI          0.7  cm^2   --------- Aortic valve area/bsa, VTI        0.34 cm^2/m^2 --------- Aortic valve area/bsa, peak        0.37 cm^2/m^2 --------- velocity Velocity ratio, mean, LVOT/AV       0.21      --------- Aortic valve area/bsa, mean        0.35 cm^2/m^2 --------- velocity Aortic regurg pressure half-time     423  ms    ---------  Aorta                   Value     Reference Aortic root ID, ED            33  mm    ---------  Left atrium                Value     Reference LA ID, A-P, ES              38  mm    --------- LA ID/bsa, A-P              1.87 cm/m^2  <=2.2 LA volume, S                65  ml    --------- LA volume/bsa, S             32  ml/m^2  --------- LA volume, ES, 1-p A4C          54  ml    --------- LA volume/bsa, ES, 1-p A4C  26.6 ml/m^2  --------- LA volume, ES, 1-p A2C          78  ml    --------- LA volume/bsa, ES, 1-p A2C        38.4 ml/m^2  ---------  Mitral valve               Value     Reference Mitral E-wave peak velocity        131  cm/s   --------- Mitral A-wave peak velocity        65.2 cm/s   --------- Mitral deceleration time     (H)   246  ms    150 - 230 Mitral peak gradient, D          7   mm Hg  --------- Mitral E/A ratio, peak          2       ---------  Pulmonary arteries            Value     Reference PA pressure, S, DP        (H)   44  mm Hg  <=30  Tricuspid valve              Value     Reference Tricuspid regurg peak velocity      322  cm/s   --------- Tricuspid peak RV-RA gradient       41  mm Hg  --------- Tricuspid maximal regurg         322  cm/s   --------- velocity, PISA  Systemic veins              Value     Reference Estimated CVP               3   mm Hg  ---------  Legend: (L) and (H) mark values outside specified reference range.  ------------------------------------------------------------------- Prepared and Electronically Authenticated by  Loralie Champagne, M.D. 2015-12-14T15:14:32     CARDIAC CATHETERIZATION Conclusion     Mid RCA lesion, 100% stenosed.  Ost 1st Diag lesion, 70% stenosed.  Ost Cx lesion, 50% stenosed.  Mid Cx lesion, 40% stenosed.  Ost RCA lesion, 60% stenosed.  Normal LV function with an ejection fraction at a proximally 55% but with evidence for small focal region of mid to basal  inferior hypocontractility. 2+ angiographic mitral regurgitation with mean palmar E wedge pressure at 19 and V wave at 31 mmHg. Severe aortic valve stenosis with a peak to peak gradient of 46 mmHg, and a mean gradient of 39.5 mmHg. Calculated aortic valve  area 0.64 cm. 1 - 2+ angiographic aortic regurgitation. Significant native coronary obstructive disease with evidence for coronary calcification and 70% proximal first diagonal stenosis; 50% proximal circumflex stenosis before the first marginal branch with 40% AV groove circumflex stenosis; 60% ostial narrowing of the RCA with 100% mid RCA occlusion after a marginal branch with bridging collaterals supplying the mid RCA extending from this marginal branch as well as left-to-right collaterals from the distal circumflex and LAD to the distal RCA.  RECOMMENDATION: Surgical consultation will be obtained for aortic valve replacement/CABG revascularization surgery.     Coronary Findings    Dominance: Right   Left Anterior Descending   . 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 Ost 1st Diag lesion, 70% stenosed. diffuse .     Left Circumflex   . Ost Cx lesion, 50% stenosed. calcified .   Marland Kitchen Mid Cx lesion, 40% stenosed.  Right Coronary Artery  Dist RCA filled by collaterals from Acute Mrg. Mid RCA filled by collaterals from Acute Mrg.   . Ost RCA lesion, 60% stenosed.   . Mid RCA lesion, 100% stenosed.   . Right Posterior Descending Artery   RPDA filled by collaterals from Dist LAD.   Marland Kitchen Inferior Septal   Inf Sept filled by collaterals from Dist Cx.       Right Heart Pressures Hemodynamic findings consistent with mild pulmonary hypertension, mitral valve regurgitation and aortic stenosis. Elevated LV EDP consistent with volume overload.    Wall Motion                 Left Heart    Left Ventricle The left ventricular size is normal. The left ventricular systolic function is normal. There are wall motion  abnormalities in the left ventricle. Mild focal upper mid to basal hypokinesis   Aortic Valve There is severe aortic valve stenosis, and mild (2+) aortic regurgitation. The aortic valve is calcified. There is restricted aortic valve motion. The aortic valve is not congenitally bicuspid.    Coronary Diagrams    Diagnostic Diagram            Implants    Name ID Temporary Type Supply   No information to display    Hemo Data       Most Recent Value   Fick Cardiac Output  4.07 L/min   Fick Cardiac Output Index  2.05 (L/min)/BSA   Thermal Cardiac Output  4.28 L/min   Thermal Cardiac Output Index  2.15 (L/min)/BSA   Aortic Mean Gradient  39.5 mmHg   Aortic Peak Gradient  46 mmHg   Aortic Value Area Index  0.32 cm2/BSA   RA A Wave  9 mmHg   RA V Wave  6 mmHg   RA Mean  5 mmHg   RV Systolic Pressure  36 mmHg   RV Diastolic Pressure  -4 mmHg   RV EDP  8 mmHg   PA Systolic Pressure  35 mmHg   PA Diastolic Pressure  15 mmHg   PA Mean  25 mmHg   PW A Wave  17 mmHg   PW V Wave  25 mmHg   PW Mean  16 mmHg   AO Systolic Pressure  272 mmHg   AO Diastolic Pressure  55 mmHg   AO Mean  82 mmHg   LV Systolic Pressure  536 mmHg   LV Diastolic Pressure  7 mmHg   LV EDP  21 mmHg   Arterial Occlusion Pressure Extended Systolic Pressure  644 mmHg   Arterial Occlusion Pressure Extended Diastolic Pressure  56 mmHg   Arterial Occlusion Pressure Extended Mean Pressure  90 mmHg   Left Ventricular Apex Extended Systolic Pressure  034 mmHg   Left Ventricular Apex Extended Diastolic Pressure  15 mmHg   Left Ventricular Apex Extended EDP Pressure  22 mmHg   TPVR Index  11.63 HRUI   TSVR Index  38.13 HRUI   PVR SVR Ratio  0.08   TPVR/TSVR Ratio  0.3    Cardiac TAVR CT  TECHNIQUE: The patient was scanned on a [employer] 256 scanner. A 120 kV retrospective scan was triggered in the descending thoracic aorta at 111 HU's. Gantry rotation speed was 270  msecs and collimation was .9 mm. 2.5 of iv Metoprolol and no nitro were given. The 3D data set was reconstructed in 5% intervals of the R-R cycle. Systolic and diastolic phases were analyzed on  a dedicated work station using MPR, MIP and VRT modes. The patient received 80 cc of contrast.  FINDINGS: Aortic Valve: Trileaflet, severely thickened, moderately calcified with severely restricted leaflet excursions.  Aorta: Normal caliber, no dissection. There are severe diffuse calcifications in the entire thoracic aorta predominantly in the aortic arch and descending thoracic aorta (no porcelain aorta).  Sinotubular Junction: 30 x 29 mm  Ascending Thoracic Aorta: 32 x 32 mm  Aortic Arch: 27 x 24 mm  Descending Thoracic Aorta: 25 x 23 mm  Sinus of Valsalva Measurements:  Non-coronary: 35 mm  Right -coronary: 35 mm  Left -coronary: 36 mm  Coronary Artery Height above Annulus:  Left Main: 14 mm  Right Coronary: 18 mm  Virtual Basal Annulus Measurements:  Maximum/Minimum Diameter: 28 x 24 mm  Perimeter: 99 mm  Area: 551 mm2  Coronary Arteries: The study suboptimal for coronary evaluation performed without use of NTG.  Optimum Fluoroscopic Angle for Delivery: LAO 10 CAU 4  IMPRESSION: 1. Severely thickened and moderately calcified aortic valve with restricted leaflet opening and annular measurements suitable for delivery of 29 mm Edward-SAPIEN 3 valve.  2. Sufficient annulus to coronary distance.  3. Optimum fluoroscopic angle for delivery LAO 10 CAU 4.  4. Severe diffuse calcifications in the entire thoracic aorta predominantly in the aortic arch and descending thoracic aorta.  Ena Dawley   Electronically Signed  By: Ena Dawley  On: 08/08/2014 19:16      Study Result     EXAM: OVER-READ INTERPRETATION CT CHEST  The following report is an over-read performed by radiologist Dr. Rebekah Chesterfield  [facility] on 08/08/2014. This over-read does not include interpretation of cardiac or coronary anatomy or pathology. The coronary calcium score/coronary CTA interpretation by the cardiologist is attached.  COMPARISON: No priors.  FINDINGS: Extracardiac findings will be fully described on separate dictation for a contemporaneously obtained CTA of the chest abdomen and pelvis.  IMPRESSION: See separate dictation for full description of extracardiac findings associated with the report for the CTA of the chest, abdomen and pelvis performed 08/08/2014.  Electronically Signed: By: Vinnie Langton M.D. On: 08/08/2014 15:30     CT ANGIOGRAPHY CHEST, ABDOMEN AND PELVIS  TECHNIQUE: Multidetector CT imaging through the chest, abdomen and pelvis was performed using the standard protocol during bolus administration of intravenous contrast. Multiplanar reconstructed images and MIPs were obtained and reviewed to evaluate the vascular anatomy.  CONTRAST: 23m OMNIPAQUE IOHEXOL 350 MG/ML SOLN, 725mOMNIPAQUE IOHEXOL 350 MG/ML SOLN  COMPARISON: CTA of the abdomen and pelvis 10/27/2010.  FINDINGS: CTA CHEST FINDINGS  Mediastinum/Lymph Nodes: Heart size is borderline enlarged with concentric left ventricular hypertrophy. There is no significant pericardial fluid, thickening or pericardial calcification. Severe thickening calcifications of the aortic valve. There is atherosclerosis of the thoracic aorta, the great vessels of the mediastinum and the coronary arteries, including calcified atherosclerotic plaque in the left main, left anterior descending, left circumflex and right coronary arteries. No pathologically enlarged mediastinal or hilar lymph nodes. Numerous densely calcified right hilar and mediastinal lymph nodes are noted. Esophagus is unremarkable in appearance. No axillary lymphadenopathy.  Lungs/Pleura: Several calcified pulmonary nodules are  noted in the lungs bilaterally, compatible with benign granulomas. 4 mm ground-glass attenuation nodule in the superior segment of the left lower lobe (image 21 of series 407). No other larger more suspicious appearing pulmonary nodules or masses are otherwise noted. Bilateral apical pleuroparenchymal thickening, somewhat nodular in appearance, presumably areas of post infectious or inflammatory scarring. Dependent ground-glass  attenuation in the posterior aspect of the right lower lobe may reflect a focus of infection or sequela of recent mild aspiration. Mild diffuse bronchial wall thickening with mild centrilobular and paraseptal emphysema. No pleural effusions.  Musculoskeletal/Soft Tissues: There are no aggressive appearing lytic or blastic lesions noted in the visualized portions of the skeleton.  CTA ABDOMEN AND PELVIS FINDINGS  Hepatobiliary: No suspicious appearing cystic or solid hepatic lesions are noted. No intra or extrahepatic biliary ductal dilatation. Tiny calcified gallstones layer dependently in the gallbladder. No signs of acute cholecystitis at this time.  Pancreas: No pancreatic mass. No pancreatic ductal dilatation. No pancreatic or peripancreatic fluid or inflammatory changes.  Spleen: Unremarkable.  Adrenals/Urinary Tract: Bilateral adrenal glands and bilateral kidneys are normal in appearance. No hydroureteronephrosis. Urinary bladder is normal in appearance.  Stomach/Bowel: Normal appearance of the stomach. No pathologic dilatation of small bowel or colon. Normal appendix.  Vascular/Lymphatic: Vascular findings and measurements pertinent to potential TAVR procedure, as detailed below. As described, patient is status post aortobifemoral bypass graft, which is widely patent. 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 Retroaortic left renal vein (normal anatomical variant)  incidentally noted. No lymphadenopathy noted in the abdomen or pelvis.  Reproductive: Status post hysterectomy. Ovaries are not confidently identified may be surgically absent or atrophic.  Other: No significant volume of ascites. No pneumoperitoneum. Surgical clips in the left inguinal region. Immediately superficial to this there is a well-circumscribed 2.6 x 2.3 cm low-attenuation lesion which likely represents a chronic postoperative fluid collections such as a seroma; this finding is smaller than remote prior study 10/27/2010.  Musculoskeletal: Well-circumscribed lucent lesion with sclerotic margins and narrow zone of transition in the intertrochanteric region of the left femur measures approximately 2.2 cm in diameter and is very similar to remote prior study 10/27/2010, and although indeterminate, is presumably benign. There are no other aggressive appearing lytic or blastic lesions noted in the visualized portions of the skeleton.  VASCULAR MEASUREMENTS PERTINENT TO TAVR:  AORTA:  Minimal Aortic Diameter - 14 x 16 mm  Severity of Aortic Calcification - Moderate to severe in the native abdominal aorta which is completely occluded in the infrarenal region, replaced by the aortobifemoral bypass graft.  COMMENT: The patient is status post aortobifemoral bypass graft. The bypass graft is widely patent, and the measurements indicated below reflect the portions of the graft which replace the corresponding portions of the native pelvic vessels. The native common iliac arteries are occluded bilaterally, while there is reconstituted flow in the native external iliac arteries and native common femoral arteries bilaterally, these vessels are diminutive in size and severely diseased with atherosclerosis.  RIGHT PELVIS:  Right Common Iliac Artery -  Minimal Diameter - 9.2 x 7.6 mm  Tortuosity - minimal  Calcification - none  Right External Iliac Artery  -  Minimal Diameter - 10.3 x 10.3 mm  Tortuosity - minimal  Calcification - none  Right Common Femoral Artery -  Minimal Diameter - 10.1 x 9.4 mm  Tortuosity - minimal  Calcification - none  LEFT PELVIS:  Left Common Iliac Artery -  Minimal Diameter - 10.1 x 10.1 mm  Tortuosity - mild  Calcification - none  Left External Iliac Artery -  Minimal Diameter - 10.1 x 10.9 mm  Tortuosity - minimal  Calcification - none  Left Common Femoral Artery -  Minimal Diameter - 10.4 x 11.1 mm  Tortuosity - minimal  Calcification - none  Review  of the MIP images confirms the above findings.  IMPRESSION: 1. Status post aortobifemoral bypass graft placement. The bypass graft is widely patent. Assuming vascular access is acceptable via the graft rather than the native vessels, this patient does appear to have suitable pelvic arterial access, as above. 2. Severe thickening in calcifications of the aortic valve, compatible with the reported clinical history of aortic stenosis. This is associated with some concentric left ventricular hypertrophy. 3. Dependent ground-glass attenuation airspace disease in the right lower lobe likely reflects a mild infection, or sequela of recent aspiration. Clinical correlation is recommended. 4. Mild diffuse bronchial wall thickening with mild centrilobular and paraseptal emphysema; imaging findings suggestive of underlying COPD. 5. Small resolving postoperative fluid collection in the left inguinal region is presumably a resolving seroma or old chronic hematoma. 6. Additional incidental findings, as above.   Electronically Signed  By: Vinnie Langton M.D.  On: 08/08/2014 15:52    Results for MIKIYA, NEBERGALL (MRN [id number]) as of 08/29/2014 20:43  Ref. Range 08/08/2014 14:47  FVC-Pre Latest Units: L 2.72  FVC-%Pred-Pre Latest Units: % 73  FEV1-Pre Latest Units: L 1.72  FEV1-%Pred-Pre Latest Units: % 61   Pre FEV1/FVC ratio Latest Units: % 63  FEV1FVC-%Pred-Pre Latest Units: % 82  FEF 25-75 Pre Latest Units: L/sec 0.72  FEF2575-%Pred-Pre Latest Units: % 33  FEV6-Pre Latest Units: L 2.53  FEV6-%Pred-Pre Latest Units: % 71  Pre FEV6/FVC Ratio Latest Units: % 93  FEV6FVC-%Pred-Pre Latest Units: % 96  FVC-Post Latest Units: L 3.09  FVC-%Pred-Post Latest Units: % 83  FVC-%Change-Post Latest Units: % 13  FEV1-Post Latest Units: L 1.88  FEV1-%Pred-Post Latest Units: % 66  FEV1-%Change-Post Latest Units: % 9  Post FEV1/FVC ratio Latest Units: % 61  FEV1FVC-%Change-Post Latest Units: % -3  FEF 25-75 Post Latest Units: L/sec 1.10  FEF2575-%Pred-Post Latest Units: % 50  FEF2575-%Change-Post Latest Units: % 52  FEV6-Post Latest Units: L 2.77  FEV6-%Pred-Post Latest Units: % 78  FEV6-%Change-Post Latest Units: % 9  Post FEV6/FVC ratio Latest Units: % 90  FEV6FVC-%Pred-Post Latest Units: % 93  FEV6FVC-%Change-Post Latest Units: % -3  TLC Latest Units: L 5.69  TLC % pred Latest Units: % 95  RV Latest Units: L 2.88  RV % pred Latest Units: % 113  DLCO cor Latest Units: ml/min/mmHg 17.87  DLCO cor % pred Latest Units: % 55  DLCO unc Latest Units: ml/min/mmHg 18.45  DLCO unc % pred Latest Units: % 57  DL/VA Latest Units: ml/min/mmHg/L 3.86  DL/VA % pred Latest Units: % 71     STS Risk Calculator  Procedure    AVR + CABG  Risk of Mortality   6.0% Morbidity or Mortality  34.2% Prolonged LOS   19.7% Short LOS    13.0% Permanent Stroke   4.2% Prolonged Vent Support  26.5% DSW Infection    0.9% Renal Failure    8.6% Reoperation    11.6%    Impression:  Patient has 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 The patient has  also recently developed intermittent episodes of tachypalpitations associated with dizzy spells and near syncope. Earlier today she was found to be in rapid atrial fibrillation with underlying left bundle branch block.   I have personally reviewed the patient's most recent transthoracic echocardiogram which was performed  in December 2015. At that time the patient had severe calcific aortic stenosis with heavy calcification, thickening, and restricted leaflet mobility involving all 3 leaflets of her aortic valve. Peak velocity across the aortic valve measured greater than 4 m/s corresponding to mean transvalvular gradient of 42 mmHg. Left ventricular ejection fraction was estimated 55-60% at that time. I have also reviewed the patient's recent cardiac catheterization. The patient has multivessel coronary artery disease with chronic occlusion of the right coronary artery and 70% stenosis of a large diagonal branch. She otherwise has moderate nonobstructive coronary artery disease. Catheterization also revealed the presence of severe calcification of nearly the entire ascending thoracic aorta. Under the circumstances, risks associated with conventional surgical aortic valve replacement and coronary artery bypass grafting would be very high, much higher than that predicted using the STS risk calculator.   Cardiac gated CT angiogram of the heart demonstrates anatomical characteristics suitable for transcatheter aortic valve replacement.  I feel that transcatheter aortic valve replacement would be a far less risky means to definitively manage the patient's severe aortic stenosis. The patient has previously undergone aortobifemoral bypass graft using a 16 x 8 bifurcated graft for aortoiliac occlusive disease. CT angiogram of the abdomen and pelvis demonstrates that this graft is widely patent and free of disease. It may be feasible to proceed via open transfemoral approach using direct cannula placement into the distal  limb of one side of the patient's aortobifemoral graft.  Alternatively, a transapical approach appears relatively straightforward.  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   Plan:  The patient and her daughter counseled at length regarding treatment alternatives for management of severe symptomatic aortic stenosis.  The patient's husband is not currently present for consultation.  Alternative approaches such as conventional aortic valve replacement, transcatheter aortic valve replacement, and palliative medical therapy were compared and contrasted at length.  The risks associated with conventional surgical aortic valve replacement were been discussed in detail, as were expectations for post-operative convalescence. Long-term prognosis with medical therapy was discussed. This discussion was placed in the context of the patient's own specific clinical presentation and past medical history.   The patient hopes to proceed with transcatheter aortic valve replacement on Friday as previously planned.  The patient stopped taking Coumadin 2 days ago in anticipation of surgery but she remains therapeutic at this time. We will give vitamin K this evening and recheck her INR tomorrow. I favor preoperative consultation with the EP service given the patient's presenting problems with paroxysmal atrial fibrillation, baseline sinus bradycardia with left bundle branch block, and a fairly significant potential that she may require permanent pacemaker following TAVR.   I spent in excess of 120 minutes during the conduct of this hospital consultation and >50% of this time involved direct face-to-face encounter for counseling and/or coordination of the patient's care.   Valentina Gu. Roxy Manns, MD 08/29/2014 8:38 PM

## 2014-08-29 NOTE — ED Notes (Signed)
Aortic valve to be replaced Friday; was in short stay for preop and pt's heart rate ranging from 40's to 156. Pt was having chest pain and became diaphoretic and pale. Denies any symptoms currently.

## 2014-08-29 NOTE — Progress Notes (Addendum)
Pt admitted to [facility].  Upon assessment pt HR 50's and sinus brady rhythm.  Tarri Fuller, PA on floor and notified.  Orders received to D/C cardizem gtt.  Will continue to monitor closely.   Orders received by Tarri Fuller, PA to leave pt on cardizem gtt at 2.5

## 2014-08-29 NOTE — ED Provider Notes (Signed)
CSN: [id number]     Arrival date & time 08/29/14  1236 History   First MD Initiated Contact with Patient 08/29/14 1247     Chief Complaint  Patient presents with  . Atrial Fibrillation     (Consider location/radiation/quality/duration/timing/severity/associated sxs/prior Treatment) Patient is a 74 y.o. female presenting with chest pain.  Chest Pain Pain location:  Unable to specify Pain quality: pressure   Pain radiates to:  Does not radiate Pain radiates to the back: no   Pain severity:  Moderate Onset quality:  Gradual Duration:  1 hour Timing:  Constant Progression:  Resolved Chronicity:  New Context comment:  Found to be in afib with rvr Relieved by:  Nothing Worsened by:  Nothing tried Ineffective treatments:  None tried Associated symptoms: dizziness (lightheadedness), nausea, palpitations and shortness of breath   Associated symptoms: no cough, no fever, no near-syncope and not vomiting     Past Medical History  Diagnosis Date  . Hypertension   . Claudication   . Heart murmur   . Muscle pain   . Seroma, postoperative     Left Groin  . Leg pain   . Coronary artery disease   . Peripheral vascular disease   . Glaucoma     early stage  . Shortness of breath dyspnea   . Aortic stenosis, severe   . GERD (gastroesophageal reflux disease)   . Headache     PMH  . Childhood asthma   . Emphysema of lung     "the beginning; slight" (08/29/2014)  . Chronic bronchitis     "usually q yr; haven't had it in the last couple years" (08/29/2014)  . Arthritis     "hips; spine" (08/29/2014)  . Stroke 1982 X 2    "a little bit weaker on the left side since" (08/29/2014)   Past Surgical History  Procedure Laterality Date  . Aorto-femoral bypass graft Bilateral 06/27/10  . Vaginal hysterectomy  1970's    Partial   . Pr vein bypass graft,aorto-fem-pop  06/12/10  . Cardiac catheterization N/A 07/25/2014    Procedure: Right/Left Heart Cath and Coronary Angiography;  Surgeon:  Troy Sine, MD;  Location: [facility];  Service: Cardiovascular;  Laterality: N/A;  . Colonoscopy    . Multiple tooth extractions    . Dilation and curettage of uterus  "2 or 3"  . Tubal ligation  1970's  . Basal cell carcinoma excision Left     face   Family History  Problem Relation Age of Onset  . Heart disease Mother   . Varicose Veins Mother   . Stroke Father   . Hypertension Father   . Heart disease Father     Aneurysm  . Heart attack Father   . Arthritis Brother   . Diabetes Daughter    History  Substance Use Topics  . Smoking status: Former Smoker -- 2.00 packs/day for 56 years    Types: Cigarettes    Quit date: 06/01/2010  . Smokeless tobacco: Never Used     Comment: HAD WEANED DOWN TO 1/4 PK A DAY BEFORE D/C  . Alcohol Use: 0.0 oz/week    0 Standard drinks or equivalent per week     Comment: 08/29/2014 "never drank much; quit in the 1990's"   OB History    No data available     Review of Systems  Constitutional: Negative for fever.  Respiratory: Positive for shortness of breath. Negative for cough.   Cardiovascular: Positive for chest pain  and palpitations. Negative for near-syncope.  Gastrointestinal: Positive for nausea. Negative for vomiting.  Neurological: Positive for dizziness (lightheadedness).  All other systems reviewed and are negative.     Allergies  Adhesive; Latex; and Tetanus toxoids  Home Medications   Prior to Admission medications   Medication Sig Start Date End Date Taking? Authorizing Provider  cetirizine (ZYRTEC) 10 MG tablet Take 10 mg by mouth daily.     Yes Historical Provider, MD  estrogens, conjugated, (PREMARIN) 1.25 MG tablet Take 1.25 mg by mouth daily.     Yes Historical Provider, MD  folic acid-pyridoxine-cyancobalamin (FOLBIC) 2.5-25-2 MG TABS Take 1 tablet by mouth daily. 05/07/14  Yes Troy Sine, MD  furosemide (LASIX) 20 MG tablet Take 20 mg by mouth as needed (fluid retention).    Yes Historical  Provider, MD  meloxicam (MOBIC) 15 MG tablet Take 15 mg by mouth daily.     Yes Historical Provider, MD  metoprolol succinate (TOPROL-XL) 25 MG 24 hr tablet Take 1 tablet (25 mg total) by mouth daily. 07/09/14  Yes Troy Sine, MD  potassium chloride (KLOR-CON) 10 MEQ CR tablet Take 10 mEq by mouth as needed (when taking Lasix).    Yes Historical Provider, MD  rosuvastatin (CRESTOR) 20 MG tablet Take 20 mg by mouth daily.     Yes Historical Provider, MD  valsartan (DIOVAN) 80 MG tablet Take 1 tablet (80 mg total) by mouth daily. 02/06/14  Yes Troy Sine, MD  warfarin (COUMADIN) 3 MG tablet Take 3 mg by mouth daily at 6 PM.    Yes Historical Provider, MD   BP 130/52 mmHg  Pulse 65  Temp(Src) 98 F (36.7 C) (Oral)  Resp 15  Ht '5\' 10"'$  (1.778 m)  Wt 178 lb (80.74 kg)  BMI 25.54 kg/m2  SpO2 95% Physical Exam  Constitutional: She is oriented to person, place, and time. She appears well-developed and well-nourished.  HENT:  Head: Normocephalic and atraumatic.  Right Ear: External ear normal.  Left Ear: External ear normal.  Eyes: Conjunctivae and EOM are normal. Pupils are equal, round, and reactive to light.  Neck: Normal range of motion. Neck supple.  Cardiovascular: Normal heart sounds and intact distal pulses.  An irregularly irregular rhythm present. Tachycardia present.   Pulmonary/Chest: Effort normal and breath sounds normal.  Abdominal: Soft. Bowel sounds are normal. There is no tenderness.  Musculoskeletal: Normal range of motion.  Neurological: She is alert and oriented to person, place, and time.  Skin: Skin is warm and dry.  Vitals reviewed.   ED Course  Procedures (including critical care time) Labs Review Labs Reviewed  BASIC METABOLIC PANEL - Abnormal; Notable for the following:    GFR calc non Af Amer 59 (*)    All other components within normal limits  CBC - Abnormal; Notable for the following:    RBC 5.26 (*)    Hemoglobin 16.3 (*)    HCT 48.5 (*)     Platelets 115 (*)    All other components within normal limits  PROTIME-INR - Abnormal; Notable for the following:    Prothrombin Time 27.0 (*)    INR 2.54 (*)    All other components within normal limits  PROTIME-INR - Abnormal; Notable for the following:    Prothrombin Time 21.9 (*)    INR 1.92 (*)    All other components within normal limits  CBC - Abnormal; Notable for the following:    Platelets 99 (*)    All  other components within normal limits  BASIC METABOLIC PANEL - Abnormal; Notable for the following:    Glucose, Bld 103 (*)    Calcium 8.7 (*)    GFR calc non Af Amer 57 (*)    All other components within normal limits  TSH  I-STAT TROPOININ, ED  TYPE AND SCREEN    Imaging Review Dg Chest 2 View  08/29/2014   CLINICAL DATA:  Preoperative chest radiograph. Aortic valve replacement.  EXAM: CHEST  2 VIEW  COMPARISON:  CT 08/08/2014.  FINDINGS: Emphysema. Bilateral pleural apical scarring demonstrated on prior chest CT. Aortic atherosclerosis. No airspace disease. No pleural effusion. 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  IMPRESSION: No acute cardiopulmonary disease. Emphysema and bilateral chronic pleural apical scarring.   Electronically Signed   By: Dereck Ligas M.D.   On: 08/29/2014 12:35     EKG Interpretation None      MDM   Final diagnoses:  Atrial fibrillation with RVR    74 y.o. female with pertinent PMH of aortic valve stenosis, HTN, prior CVA presents with new onset afib with rvr while in preop clinic for aortic valve replacement.  After initial onset, pt had chest pain, fatigue.  Asymptomatic on arrival here.  Exam as above.  Started on dilt drip, but still in RVR.  Rest of wu unremarkable.  Admitted to cardiology.    I have reviewed all laboratory and imaging studies if ordered as above  1. Atrial fibrillation with RVR         Debby Freiberg, MD 08/30/14 [phone]

## 2014-08-29 NOTE — H&P (Signed)
Cardiologist:  Katelyn [name] is an 74 y.o. female.   Chief Complaint:  HPI:   74 yo female with history of severe aortic valve stenosis, HTN, HLD, CAD, PAD, prior CVA who is referred today to the multi-disciplinary valve clinic for further discussion regarding her aortic stenosis and planning for TAVR. She is followed by Dr. Claiborne [name]. She is known to have CAD with last cath 07/25/14 showing chronically occluded RCA with left to right collaterals, 70% diagonal stenosis, 50% ostial Circumflex stenosis, 40% AV groove Circumflex stenosis. Echo shows normal LV systolic function with severe AS with mean gradient 42 mmHg. The aortic valve is heavily calcified and does not open well. 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 Trula [name] in April 2012 for impending gangrene of her feet. Over the last 2 months, she has developed exertional chest discomfort with associated dyspnea and fatigue. This led to the cardiac cath 07/26/14 which is noted below. Her CAD was felt to stable. She was referred to see Dr. Cyndia [name] in [place] [facility] and it is felt that her aortic stenosis is severe and likely causing her symptoms. She is felt to be a poor candidate for conventional AVR. She is being considered for TAVR. She has diffuse aortic calcification which also makes her a poor candidate for open surgery or trans-aortic approach to TAVR. Her PAD with prior aortobifemoral bypass will limit the option of trans-femoral access for TAVR.   The patient presented for pre-surgical work up and was found to be in [place].  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 She reports feeling her heart rate elevated.  It has done this about two times in the last week but usually lasts only a few seconds.  Today she became hot, SOB with heaviness across her chest.  She can feel her HR "flying fast".   The patient currently denies nausea, vomiting, fever, orthopnea,  dizziness, PND, cough, congestion, abdominal pain, hematochezia, melena, lower extremity edema.  She has been on coumadin since 1982 when she had a stroke.  She stopped the coumadin the day before last in anticipation of her surgery.  INR is therapeutic.      Right and left heart cath 07/25/14  Mid RCA lesion, 100% stenosed.  Ost 1st Diag lesion, 70% stenosed.  Ost Cx lesion, 50% stenosed.  Mid Cx lesion, 40% stenosed.  Ost RCA lesion, 60% stenosed.  Normal LV function with an ejection fraction at a proximally 55% but with evidence for small focal region of mid to basal inferior hypocontractility. 2+ angiographic mitral regurgitation with mean palmar E wedge pressure at 19 and V wave at 31 mmHg.  Severe aortic valve stenosis with a peak to peak gradient of 46 mmHg, and a mean gradient of 39.5 mmHg. Calculated aortic valvearea 0.64 cm.  1 - 2+ angiographic aortic regurgitation.  Significant native coronary obstructive disease with evidence for coronary calcification and 70% proximal first diagonal stenosis; 50% proximal circumflex stenosis before the first marginal branch with 40% AV groove circumflex stenosis; 60% ostial narrowing of the RCA with 100% mid RCA occlusion after a marginal branch with bridging collaterals supplying the mid RCA extending from this marginal branch as well as left-to-right collaterals from the distal circumflex and LAD to the distal RCA.  Medications:  Medication Sig  cetirizine (ZYRTEC) 10 MG tablet Take 10 mg by mouth daily.    estrogens, conjugated, (PREMARIN) 1.25 MG  tablet Take 1.25 mg by mouth daily.    FOLIC ACID PO Take 1 tablet by mouth daily.   folic acid-pyridoxine-cyancobalamin (FOLBIC) 2.5-25-2 MG TABS Take 1 tablet by mouth daily.  furosemide (LASIX) 20 MG tablet Take 20 mg by mouth as needed (fluid retention).   meloxicam (MOBIC) 15 MG tablet Take 15 mg by mouth daily.    metoprolol succinate (TOPROL-XL) 25 MG 24 hr tablet Take 1 tablet (25 mg  total) by mouth daily.  potassium chloride (KLOR-CON) 10 MEQ CR tablet Take 10 mEq by mouth as needed (when taking Lasix).   rosuvastatin (CRESTOR) 20 MG tablet Take 20 mg by mouth daily.    valsartan (DIOVAN) 80 MG tablet Take 1 tablet (80 mg total) by mouth daily.  warfarin (COUMADIN) 3 MG tablet Take 3 mg by mouth as directed.       Past Medical History  Diagnosis Date  . Hypertension   . Claudication   . Heart murmur   . Muscle pain   . Seroma, postoperative     Left Groin  . Leg pain   . Coronary artery disease   . 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 Peripheral vascular disease   . Glaucoma     early stage  . Shortness of breath dyspnea   . Aortic stenosis, severe   . GERD (gastroesophageal reflux disease)   . Headache     PMH  . Arthritis     Past Surgical History  Procedure Laterality Date  . Aortobifemoral bpg  06/27/10  . Abdominal hysterectomy      Partial   . Pr vein bypass graft,aorto-fem-pop  06/12/10  . Cardiac catheterization N/A 07/25/2014    Procedure: Right/Left Heart Cath and Coronary Angiography;  Surgeon: Katelyn Sine, MD;  Location: [facility];  Service: Cardiovascular;  Laterality: N/A;  . Colonoscopy    . Multiple tooth extractions      Family History  Problem Relation Age of Onset  . Heart disease Mother   . Varicose Veins Mother   . Stroke Father   . Hypertension Father   . Heart disease Father     Aneurysm  . Heart attack Father   . Arthritis Brother   . Diabetes Daughter    Social History:  reports that she quit smoking about 4 years ago. Her smoking use included Cigarettes. She has a 112 pack-year smoking history. She has never used smokeless tobacco. She reports that she does not drink alcohol or use illicit drugs.  Allergies:  Allergies  Allergen Reactions  . Adhesive [Tape] Rash  . Latex Rash  . Tetanus Toxoids Rash     (Not in a hospital admission)  Results for orders placed or performed during the hospital encounter of  08/29/14 (from the past 48 hour(s))  Basic metabolic panel     Status: Abnormal   Collection Time: 08/29/14 12:50 PM  Result Value Ref Range   Sodium 139 135 - 145 mmol/L   Potassium 4.4 3.5 - 5.1 mmol/L   Chloride 108 101 - 111 mmol/L   CO2 24 22 - 32 mmol/L   Glucose, Bld 97 65 - 99 mg/dL   BUN 16 6 - 20 mg/dL   Creatinine, Ser 0.95 0.44 - 1.00 mg/dL   Calcium 9.1 8.9 - 10.3 mg/dL   GFR calc non Af Amer 59 (L) >60 mL/min   GFR calc Af Amer >60 >60 mL/min    Comment: (NOTE) The eGFR has been  calculated using the CKD EPI equation. This calculation has not been validated in all clinical situations. eGFR's persistently <60 mL/min signify possible Chronic Kidney Disease.    Anion gap 7 5 - 15  CBC     Status: Abnormal   Collection Time: 08/29/14 12:50 PM  Result Value Ref Range   WBC 6.2 4.0 - 10.5 K/uL   RBC 5.26 (H) 3.87 - 5.11 MIL/uL   Hemoglobin 16.3 (H) 12.0 - 15.0 g/dL   HCT 48.5 (H) 36.0 - 46.0 %   MCV 92.2 78.0 - 100.0 fL   MCH 31.0 26.0 - 34.0 pg   MCHC 33.6 30.0 - 36.0 g/dL   RDW 14.5 11.5 - 15.5 %   Platelets 115 (L) 150 - 400 K/uL    Comment: REPEATED TO VERIFY CONSISTENT WITH PREVIOUS RESULT   Protime-INR     Status: Abnormal   Collection Time: 08/29/14 12:50 PM  Result Value Ref Range   Prothrombin Time 27.0 (H) 11.6 - 15.2 seconds   INR 2.54 (H) 0.00 - 1.49  I-stat troponin, ED     Status: None   Collection Time: 08/29/14  1:04 PM  Result Value Ref Range   Troponin i, poc 0.01 0.00 - 0.08 ng/mL   Comment 3            Comment: Due to the release kinetics of cTnI, a negative result within the first hours of the onset of symptoms does not rule out myocardial infarction with certainty. If myocardial infarction is still suspected, repeat the test at appropriate intervals.    Dg Chest 2 View  08/29/2014   CLINICAL DATA:  Preoperative chest radiograph. Aortic valve replacement.  EXAM: CHEST  2 VIEW  COMPARISON:  CT 08/08/2014.  FINDINGS: Emphysema.  Bilateral pleural apical scarring demonstrated on prior chest CT. Aortic atherosclerosis. No airspace disease. No pleural effusion. 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  IMPRESSION: No acute cardiopulmonary disease. Emphysema and bilateral chronic pleural apical scarring.   Electronically Signed   By: Dereck Ligas M.D.   On: 08/29/2014 12:35    Review of Systems  Constitutional: Positive for diaphoresis. Negative for fever.  HENT: Negative for congestion and sore throat.   Eyes: Positive for pain.  Respiratory: Positive for shortness of breath. Negative for cough.   Cardiovascular: Positive for chest pain and palpitations. Negative for orthopnea, leg swelling and PND.  Gastrointestinal: Negative for nausea, vomiting, abdominal pain, blood in stool and melena.  Genitourinary: Negative for hematuria.  Musculoskeletal: Negative for myalgias.  Neurological: Negative for dizziness.  All other systems reviewed and are negative.   Blood pressure 109/72, pulse 56, resp. rate 10, SpO2 95 %. Physical Exam  Nursing note and vitals reviewed. Constitutional: She is oriented to person, place, and time. She appears well-developed and well-nourished. No distress.  HENT:  Head: Normocephalic and atraumatic.  Mouth/Throat: No oropharyngeal exudate.  Eyes: EOM are normal. Pupils are equal, round, and reactive to light. No scleral icterus.  Neck: Normal range of motion. Neck supple.  Cardiovascular: An irregularly irregular rhythm present. Tachycardia present.   Murmur heard.  Systolic murmur is present with a grade of 2/6  Pulses:      Radial pulses are 2+ on the right side, and 2+ on the left side.       Dorsalis pedis pulses are 2+ on the right side, and 2+ on the left side.  MM at RSB  Respiratory: Effort normal. She has no wheezes.  Some crackles  GI: Soft. Bowel sounds are normal. She exhibits no distension. There is no tenderness.  Musculoskeletal: She exhibits no edema.   Lymphadenopathy:    She has no cervical adenopathy.  Neurological: She is alert and oriented to person, place, and time. She exhibits normal muscle tone.  Skin: Skin is warm and dry.  Psychiatric: She has a normal mood and affect.     Assessment/Plan Principal Problem:   Atrial fibrillation with RVR Active Problems:   Aortic stenosis, severe   Peripheral vascular disease   Essential hypertension   Hyperlipidemia with target LDL less than 70   CAD in native artery  She was given a bolus of IV cardizem and started on 23m/hr.  We increased that to 19mhrwhile I was in the room.  Rate is improving.  INR is therapeutic.  She stopped coumadin two days ago.  Will need heparin once <2.0.  Will hold diovan since we added cardizem.  Hopefully she will convert with the dilt.  Normal EF by cath and echo(01/2014).  EKG: LBBB with lateral ST depression.    HATarri FullerPABarbourmeade/29/2016, 2:43 PM  History and all data above reviewed.  Patient examined.  I agree with the findings as above. Patient with severe AS.  Planned TAVR on Friday.  However, during evaluation today she was noted to be in atrial fib with RVR.  She had presyncope with this.  Currently no chest pain or SOB.  She does feel her heart racing and she has had similar episodes in the past couple of months.  The patient exam reveals COKTC:[id number],  Lungs: Clear  ,  Abd: Positive bowel sounds, no rebound no guarding, Ext No edema  .  All available labs, radiology testing, previous records reviewed. Agree with documented assessment and plan. Atrial fib with RVR:  Admit and rate control with Cardizem.  Off of warfarin but INR greater than 2.  Heparin when INR drifts down. Contact Drs. McAlhany and [facility]   JaJeneen Rinksochrein  3:07 PM  08/29/2014

## 2014-08-29 NOTE — Progress Notes (Signed)
Anesthesia Note: Patient is a 74 year old female scheduled for TAVR on 08/31/14 by Dr. Roxy Manns. Per Dr. Camillia Herter note, her history of prior AFBG limits options for transfemoral approach and "based on her calcified aorta, will proceed with TAVR from the trans-apical approach."    Her primary cardiologist is Dr. Claiborne Billings.  She has already been evaluated by CT surgeon Dr. Cyndia Bent for TAVR and was scheduled to meed Dr. Roxy Manns this afternoon. However, I was notified by patient's PAT RN that today's EKG showed what appeared to be new-onset afib.  HR was 101 at that time. She also had a left BBB which was old. On my way over to evaluate patient, she apparently experienced an episode of acute SOB and appeared pale. O2 sat pulse check showed HR in the 150's. I came directly over to see patient while vitals were cycling and HR was back down to 89 at that time (so HR of 150's likely occurred for < 30-60 seconds). BP up to 176/130. She felt her breathing was back to baseline, and she no longer appeared pale. She denied chest pain. She was alert and cooperative. She was placed on O2/Anza at 2L.  Vitals recycled again three minutes later (1207) and BP was down to 155/84. O2 sats stayed 97-99%. Vitals were periodically checked, and she remained on continuous pulse oximetry while in PAT (see flowsheet). Dr. Angelena Form was paged and Thurmond Butts at [facility] notified.  Plans were made to transport patient to the ED.  Patient was not hypotensive during her stay in PAT; however, her HR remained irregular and her rate was quite variable with brief drops to the low 40's and up the the 120's. Due to her variable HR and what sounded like unstable symptoms with rapid afib ~ 150 bpm, the rapid response team was contacted to assist with transport of patient to the ED on a telemetry monitor. I spoke with Dr. Angelena Form just prior to her transfer to the ED. He said cardiology would plan to admit patient following initial ED evaluation.  Thurmond Butts will notify Dr. Roxy Manns of  plans for patient to be admitted.      History includes severe AS, former smoker, HTN, PAD s/p AFBG 06/27/10 (from records it appears she is on warfarin for PAD), GERD, CVA '82. PCP is listed as Dr. Marjean Donna.  She lives in [place] with her husband.  07/25/14 Cardiac cath:  Mid RCA lesion, 100% stenosed.  Ost 1st Diag lesion, 70% stenosed.  Ost Cx lesion, 50% stenosed.  Mid Cx lesion, 40% stenosed.  Ost RCA lesion, 60% stenosed. Normal LV function with an ejection fraction at a proximally 55% but with evidence for small focal region of mid to basal inferiorhypocontractility. 2+ angiographic mitral regurgitation with mean palmar E wedge pressure at 19 and V wave at 31 mmHg. Severe aortic valve stenosis with a peak to peak gradient of 46 mmHg, and a mean gradient of 39.5 mmHg. Calculated aortic valvearea 0.64 cm. 1 - 2+ angiographic aortic regurgitation. Significant native coronary obstructive disease with evidence for coronary calcification and 70% proximal first diagonal stenosis; 50% proximal circumflex stenosis before the first marginal branch with 40% AV groove circumflex stenosis; 60% ostial narrowing of the RCA with 100% mid RCA occlusion after a marginal branch with bridging collaterals supplying the mid RCA extending from this marginal branch as well as left-to-right collaterals from the distal circumflex and LAD to the distal RCA. RECOMMENDATION: Surgical consultation will be obtained for aortic valve replacement/CABG revascularization surgery.  Per Dr. Vivi Martens note, "Her symptoms are also consistent with severe aortic stenosis and it may be that the severe AS and mild to moderate AI in combination with the moderate coronary disease is bringing out symptoms. Unfortunately she has a completely calcified ascending aorta and arch which is not surprising in someone with prior abdominal aortic occlusion and I think open surgical AVR and CABG is out of the question. I think TAVR  would be a reasonable alternative for treating her AS and if she continued to have anginal symptoms it may be possible to do PCI."  02/12/14 Echo: Normal LV size with moderate LV hypertrophy, EF 55-60%. Moderatediastolic dysfunction with evidence for elevated LV filling pressure. Normal RV size and systolic function. Severe aorticstenosis with mild aortic insufficiency. Trileaflet valve. Mildpulmonary hypertension. Mean gradient (S): 42 mm Hg. Peak gradient (S): 64 mm Hg. Valve area (VTI): 0.7 cm^2.  05/03/14 Carotid duplex: RICA 50-69% diameter reduction. LICA mild fibrous plaque with no evidence of a significant diameter reduction.   Preoperative Cardiac CT and CTA of chest/abd/pelvis noted. See Results Review tab.  08/29/14 CXR: IMPRESSION: No acute cardiopulmonary disease. Emphysema and bilateral chronic pleural apical scarring.  Labs from 08/29/14 1147 and 1250 noted.   Further management by the ED team, cardiology, and CT surgery.  If procedure remains as scheduled she will be further evaluated by one of our anesthesiologists preoperatively.  [facility] Short Stay Center/Anesthesiology Phone [phone] 08/29/2014 2:43 PM

## 2014-08-30 ENCOUNTER — Other Ambulatory Visit: Payer: Self-pay | Admitting: *Deleted

## 2014-08-30 ENCOUNTER — Encounter (HOSPITAL_COMMUNITY): Payer: Self-pay | Admitting: Nurse Practitioner

## 2014-08-30 ENCOUNTER — Inpatient Hospital Stay (HOSPITAL_COMMUNITY): Payer: Medicare Other

## 2014-08-30 DIAGNOSIS — I350 Nonrheumatic aortic (valve) stenosis: Secondary | ICD-10-CM

## 2014-08-30 DIAGNOSIS — I352 Nonrheumatic aortic (valve) stenosis with insufficiency: Secondary | ICD-10-CM

## 2014-08-30 DIAGNOSIS — I495 Sick sinus syndrome: Secondary | ICD-10-CM | POA: Diagnosis present

## 2014-08-30 DIAGNOSIS — I480 Paroxysmal atrial fibrillation: Secondary | ICD-10-CM

## 2014-08-30 LAB — BASIC METABOLIC PANEL
Anion gap: 10 (ref 5–15)
BUN: 16 mg/dL (ref 6–20)
CALCIUM: 8.7 mg/dL — AB (ref 8.9–10.3)
CO2: 23 mmol/L (ref 22–32)
CREATININE: 0.97 mg/dL (ref 0.44–1.00)
Chloride: 105 mmol/L (ref 101–111)
GFR calc Af Amer: >60 mL/min (ref >60)
GFR, EST NON AFRICAN AMERICAN: 57 mL/min — AB (ref >60)
Glucose, Bld: 103 mg/dL — ABNORMAL HIGH (ref 65–99)
Potassium: 3.9 mmol/L (ref 3.5–5.1)
Sodium: 138 mmol/L (ref 135–145)

## 2014-08-30 LAB — CBC
HCT: 43.6 % (ref 36.0–46.0)
Hemoglobin: 14.3 g/dL (ref 12.0–15.0)
MCH: 30.7 pg (ref 26.0–34.0)
MCHC: 32.8 g/dL (ref 30.0–36.0)
MCV: 93.6 fL (ref 78.0–100.0)
Platelets: 99 10*3/uL — ABNORMAL LOW (ref 150–400)
RBC: 4.66 MIL/uL (ref 3.87–5.11)
RDW: 14.5 % (ref 11.5–15.5)
WBC: 5.2 10*3/uL (ref 4.0–10.5)

## 2014-08-30 LAB — HEMOGLOBIN A1C
Hgb A1c MFr Bld: 5.8 % — ABNORMAL HIGH (ref 4.8–5.6)
Mean Plasma Glucose: 120 mg/dL

## 2014-08-30 LAB — PROTIME-INR
INR: 1.92 — AB (ref 0.00–1.49)
PROTHROMBIN TIME: 21.9 s — AB (ref 11.6–15.2)

## 2014-08-30 MED ORDER — PHENYLEPHRINE HCL 10 MG/ML IJ SOLN
30.0000 ug/min | INTRAVENOUS | Status: DC
  Filled 2014-08-30: qty 2

## 2014-08-30 MED ORDER — SODIUM CHLORIDE 0.9 % IV SOLN
INTRAVENOUS | Status: DC
  Filled 2014-08-30: qty 30

## 2014-08-30 MED ORDER — OFF THE BEAT BOOK
Freq: Once | Status: AC
  Administered 2014-08-30: 1
  Filled 2014-08-30: qty 1

## 2014-08-30 MED ORDER — DEXTROSE 5 % IV SOLN
1.5000 g | INTRAVENOUS | Status: DC
  Filled 2014-08-30: qty 1.5

## 2014-08-30 MED ORDER — METOPROLOL TARTRATE 12.5 MG HALF TABLET: 12.5000 mg | ORAL_TABLET | ORAL | Status: DC

## 2014-08-30 MED ORDER — DEXMEDETOMIDINE HCL IN NACL 400 MCG/100ML IV SOLN
0.1000 ug/kg/h | INTRAVENOUS | Status: DC
  Filled 2014-08-30: qty 100

## 2014-08-30 MED ORDER — DEXTROSE 5 % IV SOLN
0.0000 ug/min | INTRAVENOUS | Status: DC
  Filled 2014-08-30: qty 4

## 2014-08-30 MED ORDER — POTASSIUM CHLORIDE 2 MEQ/ML IV SOLN
80.0000 meq | INTRAVENOUS | Status: DC
  Filled 2014-08-30: qty 40

## 2014-08-30 MED ORDER — CHLORHEXIDINE GLUCONATE 4 % EX LIQD
60.0000 mL | Freq: Once | CUTANEOUS | Status: AC
  Administered 2014-08-31: 4 via TOPICAL
  Filled 2014-08-30: qty 60

## 2014-08-30 MED ORDER — MAGNESIUM SULFATE 50 % IJ SOLN
40.0000 meq | INTRAMUSCULAR | Status: DC
  Filled 2014-08-30: qty 10

## 2014-08-30 MED ORDER — VANCOMYCIN HCL 10 G IV SOLR
1250.0000 mg | INTRAVENOUS | Status: DC
  Filled 2014-08-30: qty 1250

## 2014-08-30 MED ORDER — NITROGLYCERIN IN D5W 200-5 MCG/ML-% IV SOLN
2.0000 ug/min | INTRAVENOUS | Status: DC
  Filled 2014-08-30: qty 250

## 2014-08-30 MED ORDER — SODIUM CHLORIDE 0.9 % IV SOLN
INTRAVENOUS | Status: DC
  Filled 2014-08-30: qty 2.5

## 2014-08-30 MED ORDER — DOPAMINE-DEXTROSE 3.2-5 MG/ML-% IV SOLN
0.0000 ug/kg/min | INTRAVENOUS | Status: DC
  Filled 2014-08-30: qty 250

## 2014-08-30 MED ORDER — CHLORHEXIDINE GLUCONATE 4 % EX LIQD
60.0000 mL | Freq: Once | CUTANEOUS | Status: AC
  Administered 2014-08-30: 4 via TOPICAL
  Filled 2014-08-30: qty 60

## 2014-08-30 NOTE — Progress Notes (Signed)
Utilization review completed. Teshia Mahone, RN, BSN. 

## 2014-08-30 NOTE — Progress Notes (Addendum)
      [address]       [place] [address]             [phone]     CARDIOTHORACIC SURGERY PROGRESS NOTE  Subjective: Clinically stable and feels well.  Objective: Vital signs in last 24 hours: Temp:  [97.8 F (36.6 C)-98.2 F (36.8 C)] 98.2 F (36.8 C) (06/30 1620) Pulse Rate:  [59-69] 59 (06/30 1620) Cardiac Rhythm:  [-] Normal sinus rhythm (06/30 0800) Resp:  [13-20] 19 (06/30 1620) BP: (112-142)/(51-82) 142/82 mmHg (06/30 1620) SpO2:  [95 %-99 %] 96 % (06/30 1620) Weight:  [80.74 kg (178 lb)] 80.74 kg (178 lb) (06/30 0400)  Physical Exam:  Rhythm:   sinus  Breath sounds: clear  Heart sounds:  RRR w/ systolic murmur  Incisions:  n/a  Abdomen:  soft  Extremities:  warm   Intake/Output from previous day: 06/29 0701 - 06/30 0700 In: -  Out: 650 [Urine:650] Intake/Output this shift: Total I/O In: 840 [P.O.:840] Out: 700 [Urine:700]  Lab Results:  Recent Labs  08/29/14 1250 08/30/14 0451  WBC 6.2 5.2  HGB 16.3* 14.3  HCT 48.5* 43.6  PLT 115* 99*   BMET:  Recent Labs  08/29/14 1250 08/30/14 0451  NA 139 138  K 4.4 3.9  CL 108 105  CO2 24 23  GLUCOSE 97 103*  BUN 16 16  CREATININE 0.95 0.97  CALCIUM 9.1 8.7*    CBG (last 3)  No results for input(s): GLUCAP in the last 72 hours. PT/INR:   Recent Labs  08/30/14 0451  LABPROT 21.9*  INR 1.92*    CXR:  CHEST 2 VIEW  COMPARISON: CT 08/08/2014.  FINDINGS: Emphysema. Bilateral pleural apical scarring demonstrated on prior chest CT. Aortic atherosclerosis. No airspace disease. No pleural effusion. 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  IMPRESSION: No acute cardiopulmonary disease. Emphysema and bilateral chronic pleural apical scarring.   Electronically Signed  By: Dereck Ligas M.D.  On: 08/29/2014 12:35  Assessment/Plan:  Input from Dr Rayann Heman and EPS appreciated.  I have again reviewed the indications, risks and potential  benefits of TAVR with the patient this evening.  We plan to attempt TAVR via open right transfemoral approach using direct cutdown onto the distal limb of her aortobifemoral graft.  If there is too much scar tissue to allow for safe cannulation we will proceed with transapical approach.  All questions answered.  I spent in excess of 15 minutes during the conduct of this hospital encounter and >50% of this time involved direct face-to-face encounter with the patient for counseling and/or coordination of their care.   Rexene Alberts 08/30/2014 6:11 PM

## 2014-08-30 NOTE — Consult Note (Signed)
ELECTROPHYSIOLOGY CONSULT NOTE    Patient ID: Katelyn [name] MRN: [id number], DOB/AGE: [birth year] 74 y.o.  Admit date: 08/29/2014 Date of Consult: 08/30/2014  Primary Physician: Lanette Hampshire, MD Primary Cardiologist: Claiborne Billings Requesting Physician: Roxy Manns  Reason for Consultation: AF with RVR  HPI:  Katelyn [name] is a 74 y.o. female with a past medical history significant for CAD, hypertension, PVD, prior CVA, and severe aortic stenosis. She has been evaluated by the TAVR team and has planned TAVR tomorrow.  During pre-op evaluation with anesthesia, she developed chest pain and pre-syncope and EKG demonstrated AF with RVR.  She was admitted for further observation.  She has been placed on Diltiazem drip with improvement in rate control but with 3 second post termination pauses.  EP has been asked to evaluate for treatment options.   Echo 01/2014 demonstrated EF 79-02%, grade 2 diastolic dysfunction, trivial MR, severe AS with mild aortic insufficiency, LA 38.   She currently states that her symptoms are much improved.  She has had occasional palpitations in the past but none as severe as the day of admission.  She currently denies chest pain, shortness of breath, LE edema, recent fevers, chills, nausea or vomiting.  She has not had frank syncope.   Past Medical History  Diagnosis Date  . Hypertension   . Claudication   . Seroma, postoperative     Left Groin  . Coronary artery disease     a. April 2012 which revealed mid RCA occlusion with collaterals, 50% LAD stenosis, 30% circumflex stenosis, and 40% marginal stenosis  . Peripheral vascular disease   . Glaucoma     early stage  . Aortic stenosis, severe   . GERD (gastroesophageal reflux disease)   . Childhood asthma   . Chronic bronchitis        . Arthritis        . Stroke 1982 X 2    "a little bit weaker on the left side since" (08/29/2014)     Surgical History:  Past Surgical History  Procedure Laterality Date  .  Aorto-femoral bypass graft Bilateral 06/27/10  . Vaginal hysterectomy  1970's    Partial   . Pr vein bypass graft,aorto-fem-pop  06/12/10  . Cardiac catheterization N/A 07/25/2014    Procedure: Right/Left Heart Cath and Coronary Angiography;  Surgeon: Troy Sine, MD;  Location: [facility];  Service: Cardiovascular;  Laterality: N/A;  . Colonoscopy    . Multiple tooth extractions    . Dilation and curettage of uterus  "2 or 3"  . Tubal ligation  1970's  . Basal cell carcinoma excision Left     face     Prescriptions prior to admission  Medication Sig Dispense Refill Last Dose  . cetirizine (ZYRTEC) 10 MG tablet Take 10 mg by mouth daily.     08/28/2014 at Unknown time  . estrogens, conjugated, (PREMARIN) 1.25 MG tablet Take 1.25 mg by mouth daily.     08/28/2014 at Unknown time  . folic acid-pyridoxine-cyancobalamin (FOLBIC) 2.5-25-2 MG TABS Take 1 tablet by mouth daily. 90 tablet 3 08/28/2014 at Unknown time  . furosemide (LASIX) 20 MG tablet Take 20 mg by mouth as needed (fluid retention).    Past Week at Unknown time  . meloxicam (MOBIC) 15 MG tablet Take 15 mg by mouth daily.     Past Week at Unknown time  . metoprolol succinate (TOPROL-XL) 25 MG 24 hr tablet Take 1 tablet (25 mg total) by mouth daily.  30 tablet 9 08/28/2014 at 1900  . potassium chloride (KLOR-CON) 10 MEQ CR tablet Take 10 mEq by mouth as needed (when taking Lasix).    Past Week at Unknown time  . rosuvastatin (CRESTOR) 20 MG tablet Take 20 mg by mouth daily.     08/28/2014 at Unknown time  . valsartan (DIOVAN) 80 MG tablet Take 1 tablet (80 mg total) by mouth daily. 30 tablet 12 08/28/2014 at Unknown time  . warfarin (COUMADIN) 3 MG tablet Take 3 mg by mouth daily at 6 PM.    Past Week at Unknown time    Inpatient Medications:  . estrogens (conjugated)  1.25 mg Oral Daily  . metoprolol succinate  25 mg Oral Daily  . phytonadione  10 mg Oral Once  . rosuvastatin  20 mg Oral q1800    Allergies:  Allergies    Allergen Reactions  . Adhesive [Tape] Rash  . Latex Rash  . Tetanus Toxoids Rash    History   Social History  . Marital Status: Married    Spouse Name: N/A  . Number of Children: N/A  . Years of Education: N/A   Occupational History  . Not on file.   Social History Main Topics  . Smoking status: Former Smoker -- 2.00 packs/day for 56 years    Types: Cigarettes    Quit date: 06/01/2010  . Smokeless tobacco: Never Used     Comment: HAD WEANED DOWN TO 1/4 PK A DAY BEFORE D/C  . Alcohol Use: 0.0 oz/week    0 Standard drinks or equivalent per week     Comment: 08/29/2014 "never drank much; quit in the 1990's"  . Drug Use: No  . Sexual Activity: Yes   Other Topics Concern  . Not on file   Social History Narrative     Family History  Problem Relation Age of Onset  . Heart disease Mother   . Varicose Veins Mother   . Stroke Father   . Hypertension Father   . Heart disease Father     Aneurysm  . Heart attack Father   . Arthritis Brother   . Diabetes Daughter      Review of Systems: All other systems reviewed and are otherwise negative except as noted above.  Physical Exam: Filed Vitals:   08/30/14 0000 08/30/14 0053 08/30/14 0400 08/30/14 0721  BP: 125/51 130/52    Pulse: 68 65    Temp: 98 F (36.7 C)  97.8 F (36.6 C) 98 F (36.7 C)  TempSrc: Oral  Oral Oral  Resp:      Height:      Weight:   178 lb (80.74 kg)   SpO2: 99% 95%      GEN- The patient is well appearing, alert and oriented x 3 today.   HEENT: normocephalic, atraumatic; sclera clear, conjunctiva pink; hearing intact; oropharynx clear; neck supple Lungs- Clear to ausculation bilaterally, normal work of breathing.  No wheezes, rales, rhonchi Heart- Irregular rate and rhythm, 3/6 systolic murmur GI- soft, non-tender, non-distended, bowel sounds present  Extremities- no clubbing, cyanosis, or edema  MS- no significant deformity or atrophy Skin- warm and dry, no rash or lesion Psych- euthymic  mood, full affect Neuro- strength and sensation are intact  Labs:   Lab Results  Component Value Date   WBC 5.2 08/30/2014   HGB 14.3 08/30/2014   HCT 43.6 08/30/2014   MCV 93.6 08/30/2014   PLT 99* 08/30/2014    Recent Labs Lab 08/29/14 1147  08/30/14 0451  NA 137  < > 138  K 4.6  < > 3.9  CL 109  < > 105  CO2 19*  < > 23  BUN 17  < > 16  CREATININE 0.88  < > 0.97  CALCIUM 8.8*  < > 8.7*  PROT 6.6  --   --   BILITOT 0.5  --   --   ALKPHOS 68  --   --   ALT 18  --   --   AST 27  --   --   GLUCOSE 87  < > 103*  < > = values in this interval not displayed.    Radiology/Studies: Dg Chest 2 View 08/29/2014   CLINICAL DATA:  Preoperative chest radiograph. Aortic valve replacement.  EXAM: CHEST  2 VIEW  COMPARISON:  CT 08/08/2014.  FINDINGS: Emphysema. Bilateral pleural apical scarring demonstrated on prior chest CT. Aortic atherosclerosis. No airspace disease. No pleural effusion. 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  IMPRESSION: No acute cardiopulmonary disease. Emphysema and bilateral chronic pleural apical scarring.   Electronically Signed   By: Dereck Ligas M.D.   On: 08/29/2014 12:35   EKG:AF with RVR, LBBB, QRS 116  TELEMETRY: AF with RVR, up to 3 second post termination pauses, sinus rhythm   Assessment/Plan: 1.  Paroxysmal atrial fibrillation Katelyn [name] has been found to have paroxysmal atrial fibrillation with RVR. She also has up to 3 second post termination pauses that have been asymptomatic so far.  She has been maintained on Warfarin for stroke; continue for [facility] of at least 6.   Continue diltiazem drip for now.  With post termination pauses and underlying conduction system disease, PPM is likely indicated. Discussed with Drs Roxy Manns and Burt Knack, will plan PPM implant post TAVR.  Concern for development of CHB during surgery with underlying conduction system disease, if this occurs, will plan PPM implant on Saturday.   2.  Aortic  stenosis Management per interventional cardiology/TCTS  Dr Rayann Heman to see later today.  Signed, Chanetta Marshall, NP 08/30/2014 8:24 AM  I have seen, examined the patient, and reviewed the above assessment and plan.  On exam, RRR with late peaking systolic murmur.  Changes to above are made where necessary.  She has tachycardia/ bradycardia syndrome.  Afib with RVR contributes to her symptoms of chest pain.  She will require aggressive rate control going forward.  This is currently limited by symptomatic post termination pauses and sinus bradycardia.  She also has evidence of intrinsic conduction system disease (LBBB) and has a very high likelihood of progression to AV block in the future. Plans for TAVR are noted.  Would anticipate possible ppm implant depending on how she does with TAVR.  This could be done later this admission, or potentially more electively down the road if her AV conduction remains in tact.   Co Sign: Thompson Grayer, MD 08/30/2014 3:05 PM

## 2014-08-30 NOTE — Progress Notes (Signed)
  Echocardiogram 2D Echocardiogram has been performed.  Katelyn [name] 08/30/2014, 1:21 PM

## 2014-08-31 ENCOUNTER — Encounter (HOSPITAL_COMMUNITY)
Admission: EM | Disposition: A | Discharge status: Home Health | Payer: Medicare Other | Source: Home / Self Care | Attending: Cardiothoracic Surgery

## 2014-08-31 ENCOUNTER — Inpatient Hospital Stay (HOSPITAL_COMMUNITY): Payer: Medicare Other

## 2014-08-31 ENCOUNTER — Inpatient Hospital Stay (HOSPITAL_COMMUNITY): Payer: Medicare Other | Admitting: Vascular Surgery

## 2014-08-31 ENCOUNTER — Encounter (HOSPITAL_COMMUNITY): Payer: Self-pay | Admitting: Certified Registered Nurse Anesthetist

## 2014-08-31 ENCOUNTER — Inpatient Hospital Stay (HOSPITAL_COMMUNITY): Payer: Medicare Other | Admitting: Certified Registered Nurse Anesthetist

## 2014-08-31 ENCOUNTER — Inpatient Hospital Stay (HOSPITAL_COMMUNITY)
Admission: RE | Admit: 2014-08-31 | Payer: Medicare Other | Source: Ambulatory Visit | Admitting: Thoracic Surgery (Cardiothoracic Vascular Surgery)

## 2014-08-31 DIAGNOSIS — Z006 Encounter for examination for normal comparison and control in clinical research program: Secondary | ICD-10-CM

## 2014-08-31 DIAGNOSIS — I350 Nonrheumatic aortic (valve) stenosis: Secondary | ICD-10-CM

## 2014-08-31 DIAGNOSIS — Z952 Presence of prosthetic heart valve: Secondary | ICD-10-CM

## 2014-08-31 HISTORY — DX: Presence of prosthetic heart valve: Z95.2

## 2014-08-31 HISTORY — PX: TRANSCATHETER AORTIC VALVE REPLACEMENT, TRANSFEMORAL: SHX6400

## 2014-08-31 HISTORY — PX: TEE WITHOUT CARDIOVERSION: SHX5443

## 2014-08-31 HISTORY — DX: Nonrheumatic aortic (valve) stenosis: I35.0

## 2014-08-31 LAB — APTT
APTT: 32 s (ref 24–37)
aPTT: 26 seconds (ref 24–37)

## 2014-08-31 LAB — POCT I-STAT, CHEM 8
BUN: 13 mg/dL (ref 6–20)
BUN: 12 mg/dL (ref 6–20)
BUN: 12 mg/dL (ref 6–20)
CALCIUM ION: 1.21 mmol/L (ref 1.13–1.30)
Calcium, Ion: 1.25 mmol/L (ref 1.13–1.30)
Calcium, Ion: 1.19 mmol/L (ref 1.13–1.30)
Chloride: 106 mmol/L (ref 101–111)
Chloride: 107 mmol/L (ref 101–111)
Chloride: 108 mmol/L (ref 101–111)
Creatinine, Ser: 0.7 mg/dL (ref 0.44–1.00)
Creatinine, Ser: 0.7 mg/dL (ref 0.44–1.00)
Creatinine, Ser: 0.8 mg/dL (ref 0.44–1.00)
GLUCOSE: 129 mg/dL — AB (ref 65–99)
Glucose, Bld: 115 mg/dL — ABNORMAL HIGH (ref 65–99)
Glucose, Bld: 116 mg/dL — ABNORMAL HIGH (ref 65–99)
HCT: 38 % (ref 36.0–46.0)
HCT: 35 % — ABNORMAL LOW (ref 36.0–46.0)
HEMATOCRIT: 41 % (ref 36.0–46.0)
HEMOGLOBIN: 11.9 g/dL — AB (ref 12.0–15.0)
Hemoglobin: 13.9 g/dL (ref 12.0–15.0)
Hemoglobin: 12.9 g/dL (ref 12.0–15.0)
POTASSIUM: 4.3 mmol/L (ref 3.5–5.1)
POTASSIUM: 3.9 mmol/L (ref 3.5–5.1)
Potassium: 4.1 mmol/L (ref 3.5–5.1)
SODIUM: 140 mmol/L (ref 135–145)
Sodium: 141 mmol/L (ref 135–145)
Sodium: 142 mmol/L (ref 135–145)
TCO2: 24 mmol/L (ref 0–100)
TCO2: 26 mmol/L (ref 0–100)
TCO2: 22 mmol/L (ref 0–100)

## 2014-08-31 LAB — BASIC METABOLIC PANEL
ANION GAP: 6 (ref 5–15)
BUN: 12 mg/dL (ref 6–20)
CALCIUM: 8.4 mg/dL — AB (ref 8.9–10.3)
CO2: 25 mmol/L (ref 22–32)
Chloride: 109 mmol/L (ref 101–111)
Creatinine, Ser: 0.89 mg/dL (ref 0.44–1.00)
GFR calc Af Amer: >60 mL/min (ref >60)
Glucose, Bld: 101 mg/dL — ABNORMAL HIGH (ref 65–99)
Potassium: 4 mmol/L (ref 3.5–5.1)
Sodium: 140 mmol/L (ref 135–145)

## 2014-08-31 LAB — PROTIME-INR
INR: 1.18 (ref 0.00–1.49)
INR: 1.43 (ref 0.00–1.49)
PROTHROMBIN TIME: 15.2 s (ref 11.6–15.2)
Prothrombin Time: 17.5 seconds — ABNORMAL HIGH (ref 11.6–15.2)

## 2014-08-31 LAB — BLOOD GAS, ARTERIAL
Acid-base deficit: 0.7 mmol/L (ref 0.0–2.0)
Bicarbonate: 23.8 mEq/L (ref 20.0–24.0)
Drawn by: 10006
FIO2: 0.21 %
O2 SAT: 94.6 %
PCO2 ART: 41.4 mmHg (ref 35.0–45.0)
PH ART: 7.378 (ref 7.350–7.450)
Patient temperature: 98.6
TCO2: 25.1 mmol/L (ref 0–100)
pO2, Arterial: 76.6 mmHg — ABNORMAL LOW (ref 80.0–100.0)

## 2014-08-31 LAB — POCT I-STAT 3, ART BLOOD GAS (G3+)
Acid-base deficit: 2 mmol/L (ref 0.0–2.0)
BICARBONATE: 23.8 meq/L (ref 20.0–24.0)
O2 Saturation: 91 %
PCO2 ART: 44 mmHg (ref 35.0–45.0)
PO2 ART: 62 mmHg — AB (ref 80.0–100.0)
TCO2: 25 mmol/L (ref 0–100)
pH, Arterial: 7.338 — ABNORMAL LOW (ref 7.350–7.450)

## 2014-08-31 LAB — CBC
HCT: 42.1 % (ref 36.0–46.0)
HCT: 36.3 % (ref 36.0–46.0)
HEMOGLOBIN: 11.9 g/dL — AB (ref 12.0–15.0)
Hemoglobin: 14 g/dL (ref 12.0–15.0)
MCH: 30.8 pg (ref 26.0–34.0)
MCH: 30.7 pg (ref 26.0–34.0)
MCHC: 33.3 g/dL (ref 30.0–36.0)
MCHC: 32.8 g/dL (ref 30.0–36.0)
MCV: 92.7 fL (ref 78.0–100.0)
MCV: 93.8 fL (ref 78.0–100.0)
Platelets: 98 10*3/uL — ABNORMAL LOW (ref 150–400)
Platelets: 75 10*3/uL — ABNORMAL LOW (ref 150–400)
RBC: 4.54 MIL/uL (ref 3.87–5.11)
RBC: 3.87 MIL/uL (ref 3.87–5.11)
RDW: 14.2 % (ref 11.5–15.5)
RDW: 14.2 % (ref 11.5–15.5)
WBC: 5.7 10*3/uL (ref 4.0–10.5)
WBC: 6.1 10*3/uL (ref 4.0–10.5)

## 2014-08-31 LAB — PREPARE PLATELET PHERESIS: Unit division: 0

## 2014-08-31 LAB — PREPARE RBC (CROSSMATCH)

## 2014-08-31 SURGERY — IMPLANTATION, AORTIC VALVE, TRANSCATHETER, FEMORAL APPROACH
Anesthesia: General | Site: Chest

## 2014-08-31 MED ORDER — NOREPINEPHRINE BITARTRATE 1 MG/ML IV SOLN
0.0000 ug/min | INTRAVENOUS | Status: DC
  Filled 2014-08-31: qty 4

## 2014-08-31 MED ORDER — HYDRALAZINE HCL 20 MG/ML IJ SOLN
20.0000 mg | Freq: Once | INTRAMUSCULAR | Status: AC
  Administered 2014-08-31: 20 mg via INTRAVENOUS

## 2014-08-31 MED ORDER — INSULIN REGULAR HUMAN 100 UNIT/ML IJ SOLN
INTRAMUSCULAR | Status: DC
  Filled 2014-08-31: qty 2.5

## 2014-08-31 MED ORDER — ONDANSETRON HCL 4 MG/2ML IJ SOLN
INTRAMUSCULAR | Status: DC | PRN
  Administered 2014-08-31: 4 mg via INTRAVENOUS

## 2014-08-31 MED ORDER — ACETAMINOPHEN 160 MG/5ML PO SOLN
1000.0000 mg | Freq: Four times a day (QID) | ORAL | Status: DC
  Filled 2014-08-31: qty 40

## 2014-08-31 MED ORDER — SODIUM CHLORIDE 0.9 % IV SOLN
INTRAVENOUS | Status: DC
  Filled 2014-08-31: qty 2.5

## 2014-08-31 MED ORDER — LACTATED RINGERS IV SOLN: 500.0000 mL | Freq: Once | INTRAVENOUS | Status: AC | PRN

## 2014-08-31 MED ORDER — SODIUM CHLORIDE 0.9 % IJ SOLN
INTRAMUSCULAR | Status: DC | PRN
  Administered 2014-08-31: 10 mL via INTRAVENOUS

## 2014-08-31 MED ORDER — FENTANYL CITRATE (PF) 100 MCG/2ML IJ SOLN
INTRAMUSCULAR | Status: DC | PRN
  Administered 2014-08-31 (×2): 50 ug via INTRAVENOUS

## 2014-08-31 MED ORDER — POTASSIUM CHLORIDE 2 MEQ/ML IV SOLN
80.0000 meq | INTRAVENOUS | Status: DC
  Filled 2014-08-31: qty 40

## 2014-08-31 MED ORDER — MORPHINE SULFATE 2 MG/ML IJ SOLN
1.0000 mg | INTRAMUSCULAR | Status: AC | PRN
  Administered 2014-08-31: 2 mg via INTRAVENOUS

## 2014-08-31 MED ORDER — PROPOFOL 10 MG/ML IV BOLUS
INTRAVENOUS | Status: DC | PRN
  Administered 2014-08-31: 40 mg via INTRAVENOUS
  Administered 2014-08-31: 50 mg via INTRAVENOUS

## 2014-08-31 MED ORDER — PROTAMINE SULFATE 10 MG/ML IV SOLN
INTRAVENOUS | Status: DC | PRN
  Administered 2014-08-31: 100 mg via INTRAVENOUS

## 2014-08-31 MED ORDER — NEOSTIGMINE METHYLSULFATE 10 MG/10ML IV SOLN
INTRAVENOUS | Status: DC | PRN
  Administered 2014-08-31: 3 mg via INTRAVENOUS

## 2014-08-31 MED ORDER — ALBUMIN HUMAN 5 % IV SOLN
INTRAVENOUS | Status: DC | PRN
Start: 2014-08-31 — End: 2014-08-31
  Administered 2014-08-31 (×2): via INTRAVENOUS

## 2014-08-31 MED ORDER — ONDANSETRON HCL 4 MG/2ML IJ SOLN
4.0000 mg | Freq: Four times a day (QID) | INTRAMUSCULAR | Status: DC | PRN
  Administered 2014-08-31 (×2): 4 mg via INTRAVENOUS
  Filled 2014-08-31 (×2): qty 2

## 2014-08-31 MED ORDER — MIDAZOLAM HCL 2 MG/2ML IJ SOLN: 2.0000 mg | INTRAMUSCULAR | Status: DC | PRN

## 2014-08-31 MED ORDER — GLYCOPYRROLATE 0.2 MG/ML IJ SOLN
INTRAMUSCULAR | Status: DC | PRN
  Administered 2014-08-31 (×2): 0.4 mg via INTRAVENOUS

## 2014-08-31 MED ORDER — CLOPIDOGREL BISULFATE 75 MG PO TABS
75.0000 mg | ORAL_TABLET | Freq: Every day | ORAL | Status: DC
  Administered 2014-09-01 – 2014-09-02 (×2): 75 mg via ORAL
  Filled 2014-08-31 (×3): qty 1

## 2014-08-31 MED ORDER — INSULIN REGULAR BOLUS VIA INFUSION
0.0000 [IU] | Freq: Three times a day (TID) | INTRAVENOUS | Status: DC
  Filled 2014-08-31: qty 10

## 2014-08-31 MED ORDER — VANCOMYCIN HCL 500 MG IV SOLR
INTRAVENOUS | Status: DC | PRN
  Administered 2014-08-31: 500 mg

## 2014-08-31 MED ORDER — LACTATED RINGERS IV SOLN
INTRAVENOUS | Status: DC | PRN
  Administered 2014-08-31: 12:00:00 via INTRAVENOUS

## 2014-08-31 MED ORDER — DEXMEDETOMIDINE HCL IN NACL 200 MCG/50ML IV SOLN: 0.1000 ug/kg/h | INTRAVENOUS | Status: DC

## 2014-08-31 MED ORDER — NITROGLYCERIN IN D5W 200-5 MCG/ML-% IV SOLN
2.0000 ug/min | INTRAVENOUS | Status: DC
  Filled 2014-08-31: qty 250

## 2014-08-31 MED ORDER — MAGNESIUM SULFATE 4 GM/100ML IV SOLN
4.0000 g | Freq: Once | INTRAVENOUS | Status: AC
  Administered 2014-08-31: 4 g via INTRAVENOUS
  Filled 2014-08-31: qty 100

## 2014-08-31 MED ORDER — SODIUM CHLORIDE 0.9 % IV SOLN
20.0000 mg | INTRAVENOUS | Status: DC | PRN
  Administered 2014-08-31: 10 ug/min via INTRAVENOUS

## 2014-08-31 MED ORDER — METOPROLOL TARTRATE 12.5 MG HALF TABLET
12.5000 mg | ORAL_TABLET | Freq: Two times a day (BID) | ORAL | Status: DC
  Filled 2014-08-31: qty 1

## 2014-08-31 MED ORDER — EPHEDRINE SULFATE 50 MG/ML IJ SOLN
INTRAMUSCULAR | Status: DC | PRN
  Administered 2014-08-31: 5 mg via INTRAVENOUS

## 2014-08-31 MED ORDER — EPINEPHRINE HCL 1 MG/ML IJ SOLN
INTRAMUSCULAR | Status: DC | PRN
  Administered 2014-08-31: 1 mg

## 2014-08-31 MED ORDER — OXYCODONE HCL 5 MG PO TABS: 5.0000 mg | ORAL_TABLET | ORAL | Status: DC | PRN

## 2014-08-31 MED ORDER — ACETAMINOPHEN 160 MG/5ML PO SOLN: 650.0000 mg | Freq: Once | ORAL | Status: AC

## 2014-08-31 MED ORDER — ASPIRIN 81 MG PO CHEW: 324.0000 mg | CHEWABLE_TABLET | Freq: Every day | ORAL | Status: DC

## 2014-08-31 MED ORDER — ACETAMINOPHEN 500 MG PO TABS
1000.0000 mg | ORAL_TABLET | Freq: Four times a day (QID) | ORAL | Status: DC
  Administered 2014-09-01 – 2014-09-04 (×12): 1000 mg via ORAL
  Filled 2014-08-31 (×18): qty 2

## 2014-08-31 MED ORDER — CHLORHEXIDINE GLUCONATE 4 % EX LIQD
Freq: Once | CUTANEOUS | Status: DC
  Filled 2014-08-31: qty 60

## 2014-08-31 MED ORDER — VANCOMYCIN HCL IN DEXTROSE 1-5 GM/200ML-% IV SOLN
1000.0000 mg | Freq: Once | INTRAVENOUS | Status: AC
  Administered 2014-08-31: 1000 mg via INTRAVENOUS
  Filled 2014-08-31: qty 200

## 2014-08-31 MED ORDER — ROCURONIUM BROMIDE 100 MG/10ML IV SOLN
INTRAVENOUS | Status: DC | PRN
Start: 2014-08-31 — End: 2014-08-31
  Administered 2014-08-31: 40 mg via INTRAVENOUS

## 2014-08-31 MED ORDER — SODIUM CHLORIDE 0.9 % IV SOLN
INTRAVENOUS | Status: DC
  Filled 2014-08-31: qty 30

## 2014-08-31 MED ORDER — SODIUM CHLORIDE 0.9 % IV SOLN: 0.0125 ug/kg/min | INTRAVENOUS | Status: DC

## 2014-08-31 MED ORDER — PHENYLEPHRINE HCL 10 MG/ML IJ SOLN
0.0000 ug/min | INTRAVENOUS | Status: DC
  Filled 2014-08-31: qty 2

## 2014-08-31 MED ORDER — IODIXANOL 320 MG/ML IV SOLN
INTRAVENOUS | Status: DC | PRN
  Administered 2014-08-31: 60.1 mL via INTRAVENOUS

## 2014-08-31 MED ORDER — EPINEPHRINE HCL 1 MG/ML IJ SOLN
INTRAMUSCULAR | Status: AC
  Filled 2014-08-31: qty 1

## 2014-08-31 MED ORDER — TRAMADOL HCL 50 MG PO TABS
50.0000 mg | ORAL_TABLET | ORAL | Status: DC | PRN
  Administered 2014-09-03: 50 mg via ORAL
  Filled 2014-08-31: qty 1

## 2014-08-31 MED ORDER — DEXMEDETOMIDINE HCL IN NACL 400 MCG/100ML IV SOLN
0.1000 ug/kg/h | INTRAVENOUS | Status: AC
  Administered 2014-08-31: .4 ug/kg/h via INTRAVENOUS
  Filled 2014-08-31: qty 100

## 2014-08-31 MED ORDER — VANCOMYCIN HCL 500 MG IV SOLR
INTRAVENOUS | Status: AC
  Filled 2014-08-31: qty 1000

## 2014-08-31 MED ORDER — METOPROLOL TARTRATE 1 MG/ML IV SOLN
2.5000 mg | INTRAVENOUS | Status: DC | PRN
  Administered 2014-08-31 – 2014-09-01 (×2): 5 mg via INTRAVENOUS
  Filled 2014-08-31 (×3): qty 5

## 2014-08-31 MED ORDER — DOPAMINE-DEXTROSE 3.2-5 MG/ML-% IV SOLN: 0.0000 ug/kg/min | INTRAVENOUS | Status: DC

## 2014-08-31 MED ORDER — ASPIRIN EC 325 MG PO TBEC
325.0000 mg | DELAYED_RELEASE_TABLET | Freq: Every day | ORAL | Status: DC
  Administered 2014-09-01 – 2014-09-02 (×2): 325 mg via ORAL
  Filled 2014-08-31 (×2): qty 1

## 2014-08-31 MED ORDER — SODIUM CHLORIDE 0.9 % IR SOLN
Status: DC | PRN
  Administered 2014-08-31 (×3): 500 mL

## 2014-08-31 MED ORDER — HYDRALAZINE HCL 20 MG/ML IJ SOLN: 20.0000 mg | INTRAMUSCULAR | Status: DC | PRN

## 2014-08-31 MED ORDER — PHENYLEPHRINE HCL 10 MG/ML IJ SOLN
30.0000 ug/min | INTRAVENOUS | Status: DC
  Filled 2014-08-31: qty 2

## 2014-08-31 MED ORDER — ACETAMINOPHEN 650 MG RE SUPP
650.0000 mg | Freq: Once | RECTAL | Status: AC
  Administered 2014-08-31: 650 mg via RECTAL

## 2014-08-31 MED ORDER — MAGNESIUM SULFATE 50 % IJ SOLN
40.0000 meq | INTRAMUSCULAR | Status: DC
  Filled 2014-08-31: qty 10

## 2014-08-31 MED ORDER — METOPROLOL TARTRATE 25 MG/10 ML ORAL SUSPENSION
12.5000 mg | Freq: Two times a day (BID) | ORAL | Status: DC
  Filled 2014-08-31: qty 5

## 2014-08-31 MED ORDER — NITROGLYCERIN IN D5W 200-5 MCG/ML-% IV SOLN
0.0000 ug/min | INTRAVENOUS | Status: DC
  Filled 2014-08-31: qty 250

## 2014-08-31 MED ORDER — 0.9 % SODIUM CHLORIDE (POUR BTL) OPTIME
TOPICAL | Status: DC | PRN
  Administered 2014-08-31: 3000 mL

## 2014-08-31 MED ORDER — DEXTROSE 5 % IV SOLN
1.5000 g | INTRAVENOUS | Status: AC
  Administered 2014-08-31: 1.5 g via INTRAVENOUS

## 2014-08-31 MED ORDER — HEPARIN SODIUM (PORCINE) 1000 UNIT/ML IJ SOLN
INTRAMUSCULAR | Status: DC | PRN
  Administered 2014-08-31: 10000 [IU] via INTRAVENOUS

## 2014-08-31 MED ORDER — SODIUM CHLORIDE 0.9 % IV SOLN
4000.0000 ug | INTRAVENOUS | Status: DC | PRN
  Administered 2014-08-31: 1 ug/min via INTRAVENOUS

## 2014-08-31 MED ORDER — LABETALOL HCL 5 MG/ML IV SOLN
10.0000 mg | INTRAVENOUS | Status: DC | PRN
  Administered 2014-08-31: 10 mg via INTRAVENOUS
  Filled 2014-08-31 (×2): qty 4

## 2014-08-31 MED ORDER — SODIUM CHLORIDE 0.9 % IV SOLN
INTRAVENOUS | Status: AC
  Administered 2014-08-31: 75 mL/h via INTRAVENOUS

## 2014-08-31 MED ORDER — VANCOMYCIN HCL 10 G IV SOLR
1250.0000 mg | INTRAVENOUS | Status: AC
  Administered 2014-08-31: 1250 mg via INTRAVENOUS
  Filled 2014-08-31: qty 1250

## 2014-08-31 MED ORDER — POTASSIUM CHLORIDE 10 MEQ/50ML IV SOLN
10.0000 meq | INTRAVENOUS | Status: AC
  Administered 2014-08-31 (×3): 10 meq via INTRAVENOUS

## 2014-08-31 MED ORDER — MORPHINE SULFATE 2 MG/ML IJ SOLN
2.0000 mg | INTRAMUSCULAR | Status: DC | PRN
  Administered 2014-08-31: 4 mg via INTRAVENOUS
  Filled 2014-08-31: qty 2
  Filled 2014-08-31: qty 1

## 2014-08-31 MED ORDER — REMIFENTANIL HCL 1 MG IV SOLR
INTRAVENOUS | Status: DC | PRN
  Administered 2014-08-31: .5 ug/kg/min via INTRAVENOUS

## 2014-08-31 MED ORDER — FAMOTIDINE IN NACL 20-0.9 MG/50ML-% IV SOLN
20.0000 mg | Freq: Two times a day (BID) | INTRAVENOUS | Status: AC
  Administered 2014-08-31: 20 mg via INTRAVENOUS

## 2014-08-31 MED ORDER — ALBUMIN HUMAN 5 % IV SOLN: 250.0000 mL | INTRAVENOUS | Status: AC | PRN

## 2014-08-31 MED ORDER — SODIUM CHLORIDE 0.9 % IV SOLN
Freq: Once | INTRAVENOUS | Status: AC
  Administered 2014-09-01: 10 mL/h via INTRAVENOUS

## 2014-08-31 MED ORDER — DEXTROSE 5 % IV SOLN
1.5000 g | Freq: Two times a day (BID) | INTRAVENOUS | Status: AC
  Administered 2014-08-31 – 2014-09-02 (×4): 1.5 g via INTRAVENOUS
  Filled 2014-08-31 (×4): qty 1.5

## 2014-08-31 MED ORDER — EPINEPHRINE HCL 1 MG/ML IJ SOLN
0.0000 ug/min | INTRAVENOUS | Status: DC
  Filled 2014-08-31: qty 4

## 2014-08-31 MED ORDER — PANTOPRAZOLE SODIUM 40 MG PO TBEC
40.0000 mg | DELAYED_RELEASE_TABLET | Freq: Every day | ORAL | Status: DC
Start: 2014-09-02 — End: 2014-09-05
  Administered 2014-09-02 – 2014-09-05 (×4): 40 mg via ORAL
  Filled 2014-08-31 (×4): qty 1

## 2014-08-31 SURGICAL SUPPLY — 103 items
ADH SKN CLS APL DERMABOND .7 (GAUZE/BANDAGES/DRESSINGS) ×1
BAG BANDED W/RUBBER/TAPE 36X54 (MISCELLANEOUS) ×2 IMPLANT
BAG DECANTER FOR FLEXI CONT (MISCELLANEOUS) IMPLANT
BAG EQP BAND 135X91 W/RBR TAPE (MISCELLANEOUS) ×1
BAG SNAP BAND KOVER 36X36 (MISCELLANEOUS) ×4 IMPLANT
BLADE OSCILLATING /SAGITTAL (BLADE) IMPLANT
BLADE STERNUM SYSTEM 6 (BLADE) ×2 IMPLANT
BLADE SURG ROTATE 9660 (MISCELLANEOUS) IMPLANT
CABLE PACING FASLOC BIEGE (MISCELLANEOUS) ×1 IMPLANT
CABLE PACING FASLOC BLUE (MISCELLANEOUS) ×3 IMPLANT
CANNULA FEM VENOUS REMOTE 22FR (CANNULA) IMPLANT
CANNULA OPTISITE PERFUSION 16F (CANNULA) IMPLANT
CANNULA OPTISITE PERFUSION 18F (CANNULA) IMPLANT
CATH DIAG EXPO 6F AL2 (CATHETERS) ×1 IMPLANT
CATH DIAG EXPO 6F VENT PIG 145 (CATHETERS) ×2 IMPLANT
CATH S G BIP PACING (SET/KITS/TRAYS/PACK) ×3 IMPLANT
CATH SOFT-VU 4F 65 STRAIGHT (CATHETERS) IMPLANT
CATH SOFT-VU STRAIGHT 4F 65CM (CATHETERS) ×2
CAUTERY SURG HI TEMP FINE TIP (MISCELLANEOUS) ×1 IMPLANT
CLIP TI MEDIUM 24 (CLIP) ×2 IMPLANT
CLIP TI WIDE RED SMALL 24 (CLIP) ×2 IMPLANT
CONT SPEC 4OZ CLIKSEAL STRL BL (MISCELLANEOUS) ×3 IMPLANT
COVER BACK TABLE 24X17X13 BIG (DRAPES) ×2 IMPLANT
COVER DOME SNAP 22 D (MISCELLANEOUS) ×2 IMPLANT
COVER MAYO STAND STRL (DRAPES) ×2 IMPLANT
COVER TABLE BACK 60X90 (DRAPES) ×2 IMPLANT
CRADLE DONUT ADULT HEAD (MISCELLANEOUS) ×2 IMPLANT
DERMABOND ADVANCED (GAUZE/BANDAGES/DRESSINGS) ×1
DERMABOND ADVANCED .7 DNX12 (GAUZE/BANDAGES/DRESSINGS) ×1 IMPLANT
DRAPE INCISE IOBAN 66X45 STRL (DRAPES) IMPLANT
DRAPE SLUSH MACHINE 52X66 (DRAPES) ×2 IMPLANT
DRAPE TABLE COVER HEAVY DUTY (DRAPES) ×2 IMPLANT
DRSG TEGADERM 4X4.75 (GAUZE/BANDAGES/DRESSINGS) ×2 IMPLANT
ELECT REM PT RETURN 9FT ADLT (ELECTROSURGICAL) ×4
ELECTRODE REM PT RTRN 9FT ADLT (ELECTROSURGICAL) ×2 IMPLANT
FELT TEFLON 6X6 (MISCELLANEOUS) ×2 IMPLANT
FEMORAL VENOUS CANN RAP (CANNULA) IMPLANT
GAUZE SPONGE 4X4 12PLY STRL (GAUZE/BANDAGES/DRESSINGS) ×2 IMPLANT
GLOVE BIO SURGEON STRL SZ 6.5 (GLOVE) ×2 IMPLANT
GLOVE BIO SURGEON STRL SZ7.5 (GLOVE) ×1 IMPLANT
GLOVE ECLIPSE 7.5 STRL STRAW (GLOVE) ×2 IMPLANT
GLOVE ECLIPSE 8.0 STRL XLNG CF (GLOVE) ×4 IMPLANT
GLOVE EUDERMIC 7 POWDERFREE (GLOVE) ×4 IMPLANT
GLOVE ORTHO TXT STRL SZ7.5 (GLOVE) ×4 IMPLANT
GLOVE SURG SS PI 6.5 STRL IVOR (GLOVE) ×2 IMPLANT
GOWN STRL REUS W/ TWL LRG LVL3 (GOWN DISPOSABLE) ×3 IMPLANT
GOWN STRL REUS W/ TWL XL LVL3 (GOWN DISPOSABLE) ×6 IMPLANT
GOWN STRL REUS W/TWL LRG LVL3 (GOWN DISPOSABLE) ×6
GOWN STRL REUS W/TWL XL LVL3 (GOWN DISPOSABLE) ×12
GUIDEWIRE SAF TJ AMPL .035X180 (WIRE) ×3 IMPLANT
GUIDEWIRE SAFE TJ AMPLATZ EXST (WIRE) ×1 IMPLANT
GUIDEWIRE STRAIGHT .035 260CM (WIRE) ×1 IMPLANT
INSERT FOGARTY 61MM (MISCELLANEOUS) ×2 IMPLANT
INSERT FOGARTY SM (MISCELLANEOUS) ×4 IMPLANT
INSERT FOGARTY XLG (MISCELLANEOUS) IMPLANT
KIT BASIN OR (CUSTOM PROCEDURE TRAY) ×2 IMPLANT
KIT DILATOR VASC 18G NDL (KITS) IMPLANT
KIT HEART LEFT (KITS) ×1 IMPLANT
KIT ROOM TURNOVER OR (KITS) ×2 IMPLANT
KIT SUCTION CATH 14FR (SUCTIONS) ×4 IMPLANT
NDL PERC 18GX7CM (NEEDLE) ×1 IMPLANT
NEEDLE PERC 18GX7CM (NEEDLE) ×2 IMPLANT
NS IRRIG 1000ML POUR BTL (IV SOLUTION) ×6 IMPLANT
PACK AORTA (CUSTOM PROCEDURE TRAY) ×2 IMPLANT
PAD ARMBOARD 7.5X6 YLW CONV (MISCELLANEOUS) ×4 IMPLANT
PAD ELECT DEFIB RADIOL ZOLL (MISCELLANEOUS) ×2 IMPLANT
SHEATH PINNACLE 6F 10CM (SHEATH) ×2 IMPLANT
SPONGE GAUZE 4X4 12PLY STER LF (GAUZE/BANDAGES/DRESSINGS) ×1 IMPLANT
SPONGE LAP 4X18 X RAY DECT (DISPOSABLE) ×2 IMPLANT
STOPCOCK 4 WAY LG BORE MALE ST (IV SETS) ×1 IMPLANT
STOPCOCK MORSE 400PSI 3WAY (MISCELLANEOUS) ×4 IMPLANT
SUT ETHIBOND X763 2 0 SH 1 (SUTURE) ×2 IMPLANT
SUT GORETEX CV 4 TH 22 36 (SUTURE) ×2 IMPLANT
SUT GORETEX CV4 TH-18 (SUTURE) ×6 IMPLANT
SUT GORETEX TH-18 36 INCH (SUTURE) ×4 IMPLANT
SUT MNCRL AB 3-0 PS2 18 (SUTURE) ×2 IMPLANT
SUT PROLENE 2 0 MH 48 (SUTURE) ×3 IMPLANT
SUT PROLENE 3 0 SH1 36 (SUTURE) IMPLANT
SUT PROLENE 4 0 RB 1 (SUTURE) ×10
SUT PROLENE 4-0 RB1 .5 CRCL 36 (SUTURE) ×1 IMPLANT
SUT PROLENE 5 0 C 1 36 (SUTURE) ×4 IMPLANT
SUT PROLENE 6 0 C 1 30 (SUTURE) ×4 IMPLANT
SUT SILK  1 MH (SUTURE) ×1
SUT SILK 1 MH (SUTURE) ×1 IMPLANT
SUT SILK 2 0 SH CR/8 (SUTURE) IMPLANT
SUT VIC AB 2-0 CT1 27 (SUTURE) ×2
SUT VIC AB 2-0 CT1 TAPERPNT 27 (SUTURE) ×1 IMPLANT
SUT VIC AB 2-0 CTX 36 (SUTURE) IMPLANT
SUT VIC AB 3-0 SH 8-18 (SUTURE) ×5 IMPLANT
SYR 30ML LL (SYRINGE) ×4 IMPLANT
SYR 50ML LL SCALE MARK (SYRINGE) ×2 IMPLANT
SYR CONTROL 10ML LL (SYRINGE) ×1 IMPLANT
SYRINGE 10CC LL (SYRINGE) ×2 IMPLANT
TAPE CLOTH SURG 4X10 WHT LF (GAUZE/BANDAGES/DRESSINGS) ×1 IMPLANT
TOWEL OR 17X26 10 PK STRL BLUE (TOWEL DISPOSABLE) ×4 IMPLANT
TRANSDUCER W/STOPCOCK (MISCELLANEOUS) ×2 IMPLANT
TRAY FOLEY IC TEMP SENS 16FR (CATHETERS) ×2 IMPLANT
TUBING ART PRESS 72  MALE/FEM (TUBING) ×1
TUBING ART PRESS 72 MALE/FEM (TUBING) IMPLANT
TUBING HIGH PRESSURE 120CM (CONNECTOR) ×2 IMPLANT
VALVE HEART TRANSCATH SZ3 26MM (Prosthesis & Implant Heart) ×1 IMPLANT
WIRE .035 3MM-J 145CM (WIRE) ×1 IMPLANT
WIRE AMPLATZ SS-J .035X180CM (WIRE) ×1 IMPLANT

## 2014-08-31 NOTE — Op Note (Signed)
CARDIOTHORACIC SURGERY OPERATIVE NOTE  Date of Procedure:  08/31/2014  Preoperative Diagnosis: Severe Aortic Stenosis   Postoperative Diagnosis: Same   Procedure:    Transcatheter Aortic Valve Replacement - Open Right Transfemoral Approach  Edwards Sapien 3 Transcatheter Heart Valve (size 26 mm, model # 9600TFX, serial # [id number])   Co-Surgeons:  Valentina Gu. Roxy Manns, MD and Lauree Chandler, MD  Assistants:   John Giovanni, PA-C  Anesthesiologist:  Laurie Panda, MD  Echocardiographer:  Loralie Champagne  Pre-operative Echo Findings:  Severe aortic stenosis  Normal left ventricular systolic function  Post-operative Echo Findings:  Trivial paravalvular leak  Normal left ventricular systolic function      DETAILS OF THE OPERATIVE PROCEDURE  The majority of the procedure is documented separately in a procedure note by Dr. Angelena Form.   TRANSFEMORAL ACCESS:   A small oblique transverse incision is made immediately above the inguinal ligament in the right groin. The subcutaneous tissues are divided with electrocautery.  Dissection is continued deep to the abdominal fascia in the preperitoneal space until the right limb of the patient's aortobifemoral bypass graft was identified.Hervey Ard dissection is utilized to free up the graft proximally and distally over a short distance with adequate circumferential dissection to allow for proximal and distal control.  The patient is heparinized systemically and ACT verified > 250 seconds.  The graft is transiently occluded proximally and thermal eye cautery is used to make a small transverse incision in the graft.  The incision is dilated bluntly with a hemostat and a soft J-tipped guidewire is passed through the graft into the abdominal aorta under fluoroscopic guidance after removal of the proximal clamp.  A 6 Fr straight diagnostic catheter is placed over the guidewire and the guidewire is removed.  An Amplatz super stiff guidewire is passed  through the sheath into the descending thoracic aorta and the introducing diagnostic catheter is removed.  Serial dilators are passed over the guidewire under continuous fluoroscopic guidance, making certain that each dilator passes easily all of the way into the distal abdominal aorta.  A 14 Fr Commander introducer sheath is passed over the guidewire into the abdominal aorta.  The introducing dilator is removed, the sheath is flushed with heparinized saline, and the sheath is secured to the skin.    FEMORAL SHEATH REMOVAL AND ARTERIAL CLOSURE:  After the completion of successful valve deployment as documented separately by Dr. Angelena Form, the femoral artery sheath is removed and the incision in the graft is closed using the interrupted 4-0 Prolene sutures. Once the repair has been completed protamine was administered to reverse the anticoagulation.  The incision is irrigated with saline solution and subsequently closed in multiple layers using absorbable suture.  The skin incision is closed using a subcuticular skin closure.     Valentina Gu. Roxy Manns MD 08/31/2014 2:46 PM

## 2014-08-31 NOTE — Anesthesia Postprocedure Evaluation (Signed)
  Anesthesia Post-op Note  Patient: Katelyn [name]  Procedure(s) Performed: Procedure(s): TRANSCATHETER AORTIC VALVE REPLACEMENT, TRANSFEMORAL approach (N/A) TRANSESOPHAGEAL ECHOCARDIOGRAM (TEE) (N/A)  Patient Location: ICU  Anesthesia Type:General  Level of Consciousness: awake, alert  and patient cooperative  Airway and Oxygen Therapy: Patient Spontanous Breathing  Post-op Pain: none  Post-op Assessment: Post-op Vital signs reviewed, Patient's Cardiovascular Status Stable, Respiratory Function Stable, Patent Airway, No signs of Nausea or vomiting and Pain level controlled               Post-op Vital Signs: Reviewed and stable  Last Vitals:  Filed Vitals:   08/31/14 1027  BP: 133/85  Pulse: 96  Temp:   Resp:     Complications: No apparent anesthesia complications, temporary pacing wire in place wuth VVI 50bpm

## 2014-08-31 NOTE — Transfer of Care (Signed)
Immediate Anesthesia Transfer of Care Note  Patient: CORNELLA EMMER  Procedure(s) Performed: Procedure(s): TRANSCATHETER AORTIC VALVE REPLACEMENT, TRANSFEMORAL approach (N/A) TRANSESOPHAGEAL ECHOCARDIOGRAM (TEE) (N/A)  Patient Location: SICU  Anesthesia Type:General  Level of Consciousness: awake, alert , oriented and patient cooperative  Airway & Oxygen Therapy: Patient Spontanous Breathing and Patient connected to nasal cannula oxygen  Post-op Assessment: Report given to RN, Post -op Vital signs reviewed and stable and Patient moving all extremities  Post vital signs: Reviewed and stable  Last Vitals:  Filed Vitals:   08/31/14 1027  BP: 133/85  Pulse: 96  Temp:   Resp:     Complications: No apparent anesthesia complications

## 2014-08-31 NOTE — Anesthesia Preprocedure Evaluation (Addendum)
Anesthesia Evaluation  Patient identified by MRN, date of birth, ID band Patient awake    Reviewed: Allergy & Precautions, NPO status , Patient's Chart, lab work & pertinent test results, reviewed documented beta blocker date and time   History of Anesthesia Complications Negative for: history of anesthetic complications  Airway Mallampati: II  TM Distance: >3 FB Neck ROM: Full    Dental  (+) Partial Lower, Upper Dentures   Pulmonary neg shortness of breath, neg sleep apnea, neg COPDneg recent URI, former smoker,  breath sounds clear to auscultation        Cardiovascular hypertension, Pt. on medications and Pt. on home beta blockers - angina+ CAD and + Peripheral Vascular Disease + dysrhythmias Atrial Fibrillation + Valvular Problems/Murmurs AS Rhythm:Irregular + Systolic murmurs    Neuro/Psych CVA, Residual Symptoms negative psych ROS   GI/Hepatic negative GI ROS, Neg liver ROS,   Endo/Other  negative endocrine ROS  Renal/GU negative Renal ROS     Musculoskeletal  (+) Arthritis -,   Abdominal   Peds  Hematology negative hematology ROS (+)   Anesthesia Other Findings   Reproductive/Obstetrics                            Anesthesia Physical Anesthesia Plan  ASA: IV  Anesthesia Plan: General   Post-op Pain Management:    Induction: Intravenous  Airway Management Planned: Oral ETT  Additional Equipment: Arterial line, TEE, CVP, PA Cath and Ultrasound Guidance Line Placement  Intra-op Plan:   Post-operative Plan: Extubation in OR and Possible Post-op intubation/ventilation  Informed Consent: I have reviewed the patients History and Physical, chart, labs and discussed the procedure including the risks, benefits and alternatives for the proposed anesthesia with the patient or authorized representative who has indicated his/her understanding and acceptance.   Dental advisory given  Plan  Discussed with: Surgeon and CRNA  Anesthesia Plan Comments:         Anesthesia Quick Evaluation

## 2014-08-31 NOTE — CV Procedure (Signed)
[facility]  TAVR OPERATIVE NOTE   Date of Procedure:  08/31/2014  Preoperative Diagnosis: Severe Aortic Stenosis   Postoperative Diagnosis: Same   Procedure:    Transcatheter Aortic Valve Replacement - Transfemoral Approach  Edwards Sapien 3 THV (size 26 mm, model # [id number]/ serial # [id number])   Co-Surgeons:  Valentina Gu. Roxy Manns, MD and Lauree Chandler, MD  Anesthesiologist:  Ermalene Postin  Echocardiographer:  Aundra Dubin  Pre-operative Echo Findings:  Severe aortic stenosis  Normal left ventricular systolic function  Post-operative Echo Findings:  Trivial paravalvular leak  Normal left ventricular systolic function   BRIEF CLINICAL NOTE AND INDICATIONS FOR SURGERY  74 yo female with history of severe aortic valve stenosis, HTN, HLD, CAD, PAD, prior CVA who is here today for TAVR. She is followed by Dr. Claiborne Billings. She is known to have CAD with last cath 07/25/14 showing chronically occluded RCA with left to right collaterals, 70% diagonal stenosis, 50% ostial Circumflex stenosis, 40% AV groove Circumflex stenosis. Echo shows normal LV systolic function with severe AS with mean gradient 42 mmHg. The aortic valve is heavily calcified and does not open well. 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 Trula Slade in April 2012 for impending gangrene of her feet. Over the last 2 months, she has developed exertional chest discomfort with associated dyspnea and fatigue. This led to the cardiac cath which is noted above. Her CAD was felt to stable. She was referred to see Dr. Cyndia Bent in [place] surgery and it is felt that her aortic stenosis is severe and likely causing her symptoms. She is felt to be a poor candidate for conventional AVR given her calcified aortic arch. We have planned TAVR through the right limb of the aorto-bifemoral bypass graft.    During the course of the  patient's preoperative work up they have been evaluated comprehensively by a multidisciplinary team of specialists coordinated through the [facility] in the [facility].  They have been demonstrated to suffer from symptomatic severe aortic stenosis as noted above. The patient has been counseled extensively as to the relative risks and benefits of all options for the treatment of severe aortic stenosis including long term medical therapy, conventional surgery for aortic valve replacement, and transcatheter aortic valve replacement.  The patient has been independently evaluated by two cardiac surgeons including Dr Roxy Manns and Dr. Cyndia Bent, and they are felt to be at high risk for conventional surgical aortic valve replacement. Both surgeons indicated the patient would be a poor candidate for conventional surgery. Based upon review of all of the patient's preoperative diagnostic tests they are felt to be candidate for transcatheter aortic valve replacement using the transfemoral approach through the bypass graft as an alternative to high risk conventional surgery.    Following the decision to proceed with transcatheter aortic valve replacement, a discussion has been held regarding what types of management strategies would be attempted intraoperatively in the event of life-threatening complications, including whether or not the patient would be considered a candidate for the use of cardiopulmonary bypass and/or conversion to open sternotomy for attempted surgical intervention.  The patient has been advised of a variety of complications that might develop peculiar to this approach including but not limited to risks of death, stroke, paravalvular leak, aortic dissection or other major vascular complications, aortic annulus rupture, device embolization, cardiac rupture or perforation, acute myocardial infarction,  arrhythmia, heart block or bradycardia requiring permanent  pacemaker placement, congestive heart failure, respiratory failure, renal failure, pneumonia, infection, other late complications related to structural valve deterioration or migration, or other complications that might ultimately cause a temporary or permanent loss of functional independence or other long term morbidity.  The patient provides full informed consent for the procedure as described and all questions were answered preoperatively.    DETAILS OF THE OPERATIVE PROCEDURE  PREPARATION:    The patient is brought to the operating room on the above mentioned date and central monitoring was established by the anesthesia team including placement of Swan-Ganz catheter and radial arterial line. The patient is placed in the supine position on the operating table.  Intravenous antibiotics are administered. General endotracheal anesthesia is induced uneventfully. A Foley catheter is placed.  Baseline transesophageal echocardiogram was performed. The patient's chest, abdomen, both groins, and both lower extremities are prepared and draped in a sterile manner. A time out procedure is performed.   PERIPHERAL ACCESS:    Using the modified Seldinger technique, femoral arterial and venous access was obtained with placement of 6 Fr sheaths on the left side.  A pigtail diagnostic catheter was passed through the left femoral bypass graft sheath under fluoroscopic guidance into the aortic root.  A temporary transvenous pacemaker catheter was passed through the left femoral venous sheath under fluoroscopic guidance into the right ventricle.  The pacemaker was tested to ensure stable lead placement and pacemaker capture. Aortic root angiography was performed in order to determine the optimal angiographic angle for valve deployment.   TRANSFEMORAL ACCESS:   A cutdown to the right femoral limb of the bypass graft was performed by Dr Roxy Manns. Please see his separate operative note for details. The patient was  heparinized systemically and ACT verified > 250 seconds.    A 14 Fr transfemoral E-sheath was introduced into the bypass graft of the right femoral artery after progressively dilating over an Amplatz superstiff wire. An AL2 catheter was used to direct a straight-tip exchange length wire across the native aortic valve into the left ventricle. This was exchanged out for a pigtail catheter and position was confirmed in the LV apex. Simultaneous LV and Ao pressures were recorded.  The pigtail catheter was then exchanged for an Amplatz Extra-stiff wire in the LV apex. At that point, BAV was performed using a 23 mm valvuloplasty balloon.  Once optimal position was achieved, BAV was done under rapid ventricular pacing at 180 bpm. The patient recovered well hemodynamically.   TRANSCATHETER HEART VALVE DEPLOYMENT:  An Edwards Sapien 3 THV (size 26 mm) was prepared and crimped per manufacturer's guidelines, and the proper orientation of the valve is confirmed on the [employer] delivery system. The valve was advanced through the introducer sheath using normal technique until in an appropriate position in the abdominal aorta beyond the sheath tip. The balloon was then retracted and using the fine-tuning wheel was centered on the valve. The valve was then advanced across the aortic arch using appropriate flexion of the catheter. The valve was carefully positioned across the aortic valve annulus. The Commander catheter was retracted using normal technique. Once final position of the valve has been confirmed by angiographic assessment, the valve is deployed while temporarily holding ventilation and during rapid ventricular pacing to maintain systolic blood pressure < 50 mmHg and pulse pressure < 10 mmHg. The balloon inflation is held for >3 seconds after reaching full deployment volume. Once the balloon has fully deflated the balloon  is retracted into the ascending aorta and valve function is assessed using TEE. There  is felt to be trivial paravalvular leak and no central aortic insufficiency.  The patient's hemodynamic recovery following valve deployment is good.  The deployment balloon and guidewire are both removed. Echo demostrated acceptable post-procedural gradients, stable mitral valve function, and trivial AI.   PROCEDURE COMPLETION:  The sheath was then removed and arteriotomy repaired by Dr Roxy Manns. Please see his separate report for details. Protamine was administered once femoral arterial repair was complete. The temporary pacemaker, pigtail catheters and femoral sheaths were removed with manual pressure used for hemostasis.   The patient tolerated the procedure well and is transported to the surgical intensive care in stable condition. There were no immediate intraoperative complications. All sponge instrument and needle counts are verified correct at completion of the operation.   No blood products were administered during the operation.  The patient received a total of  60.5 mL of intravenous contrast during the procedure.  MCALHANY,CHRISTOPHER MD 08/31/2014 2:15 PM

## 2014-08-31 NOTE — OR Nursing (Signed)
Dr. Roxy Manns was informed of patient's report of latex sensitivity. Dr. Roxy Manns stated that he is ok with using latex products for this procedure.

## 2014-08-31 NOTE — Progress Notes (Signed)
Patient: Katelyn [name] / Admit Date: 08/29/2014 / Date of Encounter: 08/31/2014, 10:12 AM   Subjective: Awaiting TAVR, denies chest pain or sob.    Objective: Telemetry: nsr Physical Exam: Blood pressure 141/70, pulse 60, temperature 97.8 F (36.6 C), temperature source Oral, resp. rate 15, height '5\' 10"'$  (1.778 m), weight 174 lb 12.8 oz (79.289 kg), SpO2 95 %. General: Well developed, well nourished, in no acute distress. Head: Normocephalic, atraumatic, sclera non-icteric, no xanthomas, nares are without discharge. Neck: Negative for carotid bruits. JVP not elevated. Lungs: Clear bilaterally to auscultation without wheezes, rales, or rhonchi. Breathing is unlabored. Heart: RRR S1 S2 with AS murmurs, rubs, or gallops.  Abdomen: Soft, non-tender, non-distended with normoactive bowel sounds. No rebound/guarding. Extremities: No clubbing or cyanosis. No edema. Distal pedal pulses are 2+ and equal bilaterally. Neuro: Alert and oriented X 3. Moves all extremities spontaneously. Psych:  Responds to questions appropriately with a normal affect.   Intake/Output Summary (Last 24 hours) at 08/31/14 1012 Last data filed at 08/31/14 0858  Gross per 24 hour  Intake    240 ml  Output   2150 ml  Net  -1910 ml    Inpatient Medications:  . cefUROXime (ZINACEF)  IV  1.5 g Intravenous To OR  . chlorhexidine   Topical Once  . dexmedetomidine  0.1-0.7 mcg/kg/hr Intravenous To OR  . DOPamine  0-10 mcg/kg/min Intravenous To OR  . epinephrine  0-10 mcg/min Intravenous To OR  . estrogens (conjugated)  1.25 mg Oral Daily  . heparin 30,000 units/NS 1000 mL solution for CELLSAVER   Other To OR  . insulin (NOVOLIN-R) infusion   Intravenous To OR  . magnesium sulfate  40 mEq Other To OR  . metoprolol succinate  25 mg Oral Daily  . metoprolol tartrate  12.5 mg Oral To SS-Surg  . nitroGLYCERIN  2-200 mcg/min Intravenous To OR  . norepinephrine (LEVOPHED) Adult infusion  0-10 mcg/min Intravenous To OR  .  phenylephrine (NEO-SYNEPHRINE) Adult infusion  30-200 mcg/min Intravenous To OR  . potassium chloride  80 mEq Other To OR  . rosuvastatin  20 mg Oral q1800  . vancomycin  1,250 mg Intravenous To OR   Infusions:  . diltiazem (CARDIZEM) infusion 2.5 mg/hr (08/31/14 0806)    Labs:  Recent Labs  08/30/14 0451 08/31/14 0350  NA 138 140  K 3.9 4.0  CL 105 109  CO2 23 25  GLUCOSE 103* 101*  BUN 16 12  CREATININE 0.97 0.89  CALCIUM 8.7* 8.4*    Recent Labs  08/29/14 1147  AST 27  ALT 18  ALKPHOS 68  BILITOT 0.5  PROT 6.6  ALBUMIN 3.8    Recent Labs  08/30/14 0451 08/31/14 0350  WBC 5.2 5.7  HGB 14.3 14.0  HCT 43.6 42.1  MCV 93.6 92.7  PLT 99* 98*   No results for input(s): CKTOTAL, CKMB, TROPONINI in the last 72 hours. Invalid input(s): POCBNP  Recent Labs  08/29/14 1151  HGBA1C 5.8*     Radiology/Studies:  Dg Chest 2 View  08/29/2014   CLINICAL DATA:  Preoperative chest radiograph. Aortic valve replacement.  EXAM: CHEST  2 VIEW  COMPARISON:  CT 08/08/2014.  FINDINGS: Emphysema. Bilateral pleural apical scarring demonstrated on prior chest CT. Aortic atherosclerosis. No airspace disease. No pleural effusion. 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  IMPRESSION: No acute cardiopulmonary disease. Emphysema and bilateral chronic pleural apical scarring.   Electronically Signed   By: Arvilla Market.D.  On: 08/29/2014 12:35   Ct Coronary Morp W/cta Cor W/score W/ca W/cm &/or Wo/cm  08/08/2014   ADDENDUM REPORT: 08/08/2014 19:16  CLINICAL DATA:  74 year old female with severe aortic stenosis.  EXAM: Cardiac TAVR CT  TECHNIQUE: The patient was scanned on a Philips 256 scanner. A 120 kV retrospective scan was triggered in the descending thoracic aorta at 111 HU's. Gantry rotation speed was 270 msecs and collimation was .9 mm. 2.5 of iv Metoprolol and no nitro were given. The 3D data set was reconstructed in 5% intervals of the R-R cycle. Systolic and  diastolic phases were analyzed on a dedicated work station using MPR, MIP and VRT modes. The patient received 80 cc of contrast.  FINDINGS: Aortic Valve: Trileaflet, severely thickened, moderately calcified with severely restricted leaflet excursions.  Aorta: Normal caliber, no dissection. There are severe diffuse calcifications in the entire thoracic aorta predominantly in the aortic arch and descending thoracic aorta (no porcelain aorta).  Sinotubular Junction:  30 x 29 mm  Ascending Thoracic Aorta:  32 x 32 mm  Aortic Arch:  27 x 24 mm  Descending Thoracic Aorta:  25 x 23 mm  Sinus of Valsalva Measurements:  Non-coronary:  35 mm  Right -coronary:  35 mm  Left -coronary:  36 mm  Coronary Artery Height above Annulus:  Left Main:  14 mm  Right Coronary:  18 mm  Virtual Basal Annulus Measurements:  Maximum/Minimum Diameter:  28 x 24 mm  Perimeter:  99 mm  Area:  551 mm2  Coronary Arteries: The study suboptimal for coronary evaluation performed without use of NTG.  Optimum Fluoroscopic Angle for Delivery:  LAO 10 CAU 4  IMPRESSION: 1. Severely thickened and moderately calcified aortic valve with restricted leaflet opening and annular measurements suitable for delivery of 29 mm Edward-SAPIEN 3 valve.  2.  Sufficient annulus to coronary distance.  3. Optimum fluoroscopic angle for delivery LAO 10 CAU 4.  4. Severe diffuse calcifications in the entire thoracic aorta predominantly in the aortic arch and descending thoracic aorta.  Ena Dawley   Electronically Signed   By: Ena Dawley   On: 08/08/2014 19:16   08/08/2014   EXAM: OVER-READ INTERPRETATION  CT CHEST  The following report is an over-read performed by radiologist Dr. Rebekah Chesterfield [facility] on 08/08/2014. This over-read does not include interpretation of cardiac or coronary anatomy or pathology. The coronary calcium score/coronary CTA interpretation by the cardiologist is attached.  COMPARISON:  No priors.  FINDINGS: Extracardiac  findings will be fully described on separate dictation for a contemporaneously obtained CTA of the chest abdomen and pelvis.  IMPRESSION: See separate dictation for full description of extracardiac findings associated with the report for the CTA of the chest, abdomen and pelvis performed 08/08/2014.  Electronically Signed: By: Vinnie Langton M.D. On: 08/08/2014 15:30   Ct Angio Chest Aorta W/cm &/or Wo/cm  08/08/2014   CLINICAL DATA:  74 year old female with history of severe aortic stenosis. Preprocedural study prior to potential transcatheter aortic valve replacement (TAVR).  EXAM: CT ANGIOGRAPHY CHEST, ABDOMEN AND PELVIS  TECHNIQUE: Multidetector CT imaging through the chest, abdomen and pelvis was performed using the standard protocol during bolus administration of intravenous contrast. Multiplanar reconstructed images and MIPs were obtained and reviewed to evaluate the vascular anatomy.  CONTRAST:  35m OMNIPAQUE IOHEXOL 350 MG/ML SOLN, 751mOMNIPAQUE IOHEXOL 350 MG/ML SOLN  COMPARISON:  CTA of the abdomen and pelvis 10/27/2010.  FINDINGS: CTA CHEST FINDINGS  Mediastinum/Lymph Nodes: Heart size  is borderline enlarged with concentric left ventricular hypertrophy. There is no significant pericardial fluid, thickening or pericardial calcification. Severe thickening calcifications of the aortic valve. There is atherosclerosis of the thoracic aorta, the great vessels of the mediastinum and the coronary arteries, including calcified atherosclerotic plaque in the left main, left anterior descending, left circumflex and right coronary arteries. No pathologically enlarged mediastinal or hilar lymph nodes. Numerous densely calcified right hilar and mediastinal lymph nodes are noted. Esophagus is unremarkable in appearance. No axillary lymphadenopathy.  Lungs/Pleura: Several calcified pulmonary nodules are noted in the lungs bilaterally, compatible with benign granulomas. 4 mm ground-glass attenuation nodule in the  superior segment of the left lower lobe (image 21 of series 407). No other larger more suspicious appearing pulmonary nodules or masses are otherwise noted. Bilateral apical pleuroparenchymal thickening, somewhat nodular in appearance, presumably areas of post infectious or inflammatory scarring. Dependent ground-glass attenuation in the posterior aspect of the right lower lobe may reflect a focus of infection or sequela of recent mild aspiration. Mild diffuse bronchial wall thickening with mild centrilobular and paraseptal emphysema. No pleural effusions.  Musculoskeletal/Soft Tissues: There are no aggressive appearing lytic or blastic lesions noted in the visualized portions of the skeleton.  CTA ABDOMEN AND PELVIS FINDINGS  Hepatobiliary: No suspicious appearing cystic or solid hepatic lesions are noted. No intra or extrahepatic biliary ductal dilatation. Tiny calcified gallstones layer dependently in the gallbladder. No signs of acute cholecystitis at this time.  Pancreas: No pancreatic mass. No pancreatic ductal dilatation. No pancreatic or peripancreatic fluid or inflammatory changes.  Spleen: Unremarkable.  Adrenals/Urinary Tract: Bilateral adrenal glands and bilateral kidneys are normal in appearance. No hydroureteronephrosis. Urinary bladder is normal in appearance.  Stomach/Bowel: Normal appearance of the stomach. No pathologic dilatation of small bowel or colon. Normal appendix.  Vascular/Lymphatic: Vascular findings and measurements pertinent to potential TAVR procedure, as detailed below. As described, patient is status post aortobifemoral bypass graft, which is widely patent. 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 Retroaortic left renal vein (normal anatomical variant) incidentally noted. No lymphadenopathy noted in the abdomen or pelvis.  Reproductive: Status post hysterectomy. Ovaries are not confidently identified may be  surgically absent or atrophic.  Other: No significant volume of ascites. No pneumoperitoneum. Surgical clips in the left inguinal region. Immediately superficial to this there is a well-circumscribed 2.6 x 2.3 cm low-attenuation lesion which likely represents a chronic postoperative fluid collections such as a seroma; this finding is smaller than remote prior study 10/27/2010.  Musculoskeletal: Well-circumscribed lucent lesion with sclerotic margins and narrow zone of transition in the intertrochanteric region of the left femur measures approximately 2.2 cm in diameter and is very similar to remote prior study 10/27/2010, and although indeterminate, is presumably benign. There are no other aggressive appearing lytic or blastic lesions noted in the visualized portions of the skeleton.  VASCULAR MEASUREMENTS PERTINENT TO TAVR:  AORTA:  Minimal Aortic Diameter -  14 x 16 mm  Severity of Aortic Calcification - Moderate to severe in the native abdominal aorta which is completely occluded in the infrarenal region, replaced by the aortobifemoral bypass graft.  COMMENT: The patient is status post aortobifemoral bypass graft. The bypass graft is widely patent, and the measurements indicated below reflect the portions of the graft which replace the corresponding portions of the native pelvic vessels. The native common iliac arteries are occluded bilaterally, while there is reconstituted flow in the native external iliac arteries and native common femoral  arteries bilaterally, these vessels are diminutive in size and severely diseased with atherosclerosis.  RIGHT PELVIS:  Right Common Iliac Artery -  Minimal Diameter - 9.2 x 7.6 mm  Tortuosity - minimal  Calcification - none  Right External Iliac Artery -  Minimal Diameter - 10.3 x 10.3 mm  Tortuosity - minimal  Calcification - none  Right Common Femoral Artery -  Minimal Diameter - 10.1 x 9.4 mm  Tortuosity - minimal  Calcification - none  LEFT PELVIS:  Left Common Iliac  Artery -  Minimal Diameter - 10.1 x 10.1 mm  Tortuosity - mild  Calcification - none  Left External Iliac Artery -  Minimal Diameter - 10.1 x 10.9 mm  Tortuosity - minimal  Calcification - none  Left Common Femoral Artery -  Minimal Diameter - 10.4 x 11.1 mm  Tortuosity - minimal  Calcification - none  Review of the MIP images confirms the above findings.  IMPRESSION: 1. Status post aortobifemoral bypass graft placement. The bypass graft is widely patent. Assuming vascular access is acceptable via the graft rather than the native vessels, this patient does appear to have suitable pelvic arterial access, as above. 2. Severe thickening in calcifications of the aortic valve, compatible with the reported clinical history of aortic stenosis. This is associated with some concentric left ventricular hypertrophy. 3. Dependent ground-glass attenuation airspace disease in the right lower lobe likely reflects a mild infection, or sequela of recent aspiration. Clinical correlation is recommended. 4. Mild diffuse bronchial wall thickening with mild centrilobular and paraseptal emphysema; imaging findings suggestive of underlying COPD. 5. Small resolving postoperative fluid collection in the left inguinal region is presumably a resolving seroma or old chronic hematoma. 6. Additional incidental findings, as above.   Electronically Signed   By: Vinnie Langton M.D.   On: 08/08/2014 15:52   Ct Angio Abd/pel W/ And/or W/o  08/08/2014   CLINICAL DATA:  74 year old female with history of severe aortic stenosis. Preprocedural study prior to potential transcatheter aortic valve replacement (TAVR).  EXAM: CT ANGIOGRAPHY CHEST, ABDOMEN AND PELVIS  TECHNIQUE: Multidetector CT imaging through the chest, abdomen and pelvis was performed using the standard protocol during bolus administration of intravenous contrast. Multiplanar reconstructed images and MIPs were obtained and reviewed to evaluate the vascular anatomy.  CONTRAST:  58m  OMNIPAQUE IOHEXOL 350 MG/ML SOLN, 739mOMNIPAQUE IOHEXOL 350 MG/ML SOLN  COMPARISON:  CTA of the abdomen and pelvis 10/27/2010.  FINDINGS: CTA CHEST FINDINGS  Mediastinum/Lymph Nodes: Heart size is borderline enlarged with concentric left ventricular hypertrophy. There is no significant pericardial fluid, thickening or pericardial calcification. Severe thickening calcifications of the aortic valve. There is atherosclerosis of the thoracic aorta, the great vessels of the mediastinum and the coronary arteries, including calcified atherosclerotic plaque in the left main, left anterior descending, left circumflex and right coronary arteries. No pathologically enlarged mediastinal or hilar lymph nodes. Numerous densely calcified right hilar and mediastinal lymph nodes are noted. Esophagus is unremarkable in appearance. No axillary lymphadenopathy.  Lungs/Pleura: Several calcified pulmonary nodules are noted in the lungs bilaterally, compatible with benign granulomas. 4 mm ground-glass attenuation nodule in the superior segment of the left lower lobe (image 21 of series 407). No other larger more suspicious appearing pulmonary nodules or masses are otherwise noted. Bilateral apical pleuroparenchymal thickening, somewhat nodular in appearance, presumably areas of post infectious or inflammatory scarring. Dependent ground-glass attenuation in the posterior aspect of the right lower lobe may reflect a focus of infection or sequela of recent  mild aspiration. Mild diffuse bronchial wall thickening with mild centrilobular and paraseptal emphysema. No pleural effusions.  Musculoskeletal/Soft Tissues: There are no aggressive appearing lytic or blastic lesions noted in the visualized portions of the skeleton.  CTA ABDOMEN AND PELVIS FINDINGS  Hepatobiliary: No suspicious appearing cystic or solid hepatic lesions are noted. No intra or extrahepatic biliary ductal dilatation. Tiny calcified gallstones layer dependently in the  gallbladder. No signs of acute cholecystitis at this time.  Pancreas: No pancreatic mass. No pancreatic ductal dilatation. No pancreatic or peripancreatic fluid or inflammatory changes.  Spleen: Unremarkable.  Adrenals/Urinary Tract: Bilateral adrenal glands and bilateral kidneys are normal in appearance. No hydroureteronephrosis. Urinary bladder is normal in appearance.  Stomach/Bowel: Normal appearance of the stomach. No pathologic dilatation of small bowel or colon. Normal appendix.  Vascular/Lymphatic: Vascular findings and measurements pertinent to potential TAVR procedure, as detailed below. As described, patient is status post aortobifemoral bypass graft, which is widely patent. 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 Retroaortic left renal vein (normal anatomical variant) incidentally noted. No lymphadenopathy noted in the abdomen or pelvis.  Reproductive: Status post hysterectomy. Ovaries are not confidently identified may be surgically absent or atrophic.  Other: No significant volume of ascites. No pneumoperitoneum. Surgical clips in the left inguinal region. Immediately superficial to this there is a well-circumscribed 2.6 x 2.3 cm low-attenuation lesion which likely represents a chronic postoperative fluid collections such as a seroma; this finding is smaller than remote prior study 10/27/2010.  Musculoskeletal: Well-circumscribed lucent lesion with sclerotic margins and narrow zone of transition in the intertrochanteric region of the left femur measures approximately 2.2 cm in diameter and is very similar to remote prior study 10/27/2010, and although indeterminate, is presumably benign. There are no other aggressive appearing lytic or blastic lesions noted in the visualized portions of the skeleton.  VASCULAR MEASUREMENTS PERTINENT TO TAVR:  AORTA:  Minimal Aortic Diameter -  14 x 16 mm  Severity of Aortic Calcification - Moderate  to severe in the native abdominal aorta which is completely occluded in the infrarenal region, replaced by the aortobifemoral bypass graft.  COMMENT: The patient is status post aortobifemoral bypass graft. The bypass graft is widely patent, and the measurements indicated below reflect the portions of the graft which replace the corresponding portions of the native pelvic vessels. The native common iliac arteries are occluded bilaterally, while there is reconstituted flow in the native external iliac arteries and native common femoral arteries bilaterally, these vessels are diminutive in size and severely diseased with atherosclerosis.  RIGHT PELVIS:  Right Common Iliac Artery -  Minimal Diameter - 9.2 x 7.6 mm  Tortuosity - minimal  Calcification - none  Right External Iliac Artery -  Minimal Diameter - 10.3 x 10.3 mm  Tortuosity - minimal  Calcification - none  Right Common Femoral Artery -  Minimal Diameter - 10.1 x 9.4 mm  Tortuosity - minimal  Calcification - none  LEFT PELVIS:  Left Common Iliac Artery -  Minimal Diameter - 10.1 x 10.1 mm  Tortuosity - mild  Calcification - none  Left External Iliac Artery -  Minimal Diameter - 10.1 x 10.9 mm  Tortuosity - minimal  Calcification - none  Left Common Femoral Artery -  Minimal Diameter - 10.4 x 11.1 mm  Tortuosity - minimal  Calcification - none  Review of the MIP images confirms the above findings.  IMPRESSION: 1. Status post aortobifemoral bypass graft placement. The bypass  graft is widely patent. Assuming vascular access is acceptable via the graft rather than the native vessels, this patient does appear to have suitable pelvic arterial access, as above. 2. Severe thickening in calcifications of the aortic valve, compatible with the reported clinical history of aortic stenosis. This is associated with some concentric left ventricular hypertrophy. 3. Dependent ground-glass attenuation airspace disease in the right lower lobe likely reflects a mild infection, or  sequela of recent aspiration. Clinical correlation is recommended. 4. Mild diffuse bronchial wall thickening with mild centrilobular and paraseptal emphysema; imaging findings suggestive of underlying COPD. 5. Small resolving postoperative fluid collection in the left inguinal region is presumably a resolving seroma or old chronic hematoma. 6. Additional incidental findings, as above.   Electronically Signed   By: Vinnie Langton M.D.   On: 08/08/2014 15:52     Assessment and Plan  1.Aortic stenosis for TAVR 2. LBBB 3. H/o bradycardia Rec: If she has heart block develop after TAVR, we will plan to proceed with early PPM. I have discussed the risks/benefits/goals/expectations of the procedure and she wishes to proceed if needed.  Mikle Bosworth.D.

## 2014-08-31 NOTE — Care Management Note (Signed)
Case Management Note  Patient Details  Name: Katelyn [name] MRN: [id number] Date of Birth: 1940-05-22  Subjective/Objective:  Pt admitted for AF with RVR. Severe Aortic Stenosis. Pan for TAVR today. Pt is from home with husband.                   Action/Plan: CM will continue to monitor disposition needs post procedure.    Expected Discharge Date:                  Expected Discharge Plan:  [place]  In-House Referral:     Discharge planning Services  CM Consult  Post Acute Care Choice:    Choice offered to:     DME Arranged:    DME Agency:     HH Arranged:    [place] Agency:     Status of Service:  In process, will continue to follow  Medicare Important Message Given:    Date Medicare IM Given:    Medicare IM give by:    Date Additional Medicare IM Given:    Additional Medicare Important Message give by:     If discussed at [place] of Stay Meetings, dates discussed:    Additional Comments:  Bethena Roys, RN 08/31/2014, 9:45 AM

## 2014-08-31 NOTE — Anesthesia Procedure Notes (Addendum)
Procedure Name: Intubation Date/Time: 08/31/2014 12:12 PM Performed by: Rogers Blocker Pre-anesthesia Checklist: Patient identified, Emergency Drugs available, Suction available, Patient being monitored and Timeout performed Patient Re-evaluated:Patient Re-evaluated prior to inductionOxygen Delivery Method: Circle system utilized Preoxygenation: Pre-oxygenation with 100% oxygen Intubation Type: IV induction Ventilation: Mask ventilation without difficulty Laryngoscope Size: Mac and 3 Grade View: Grade I Tube type: Oral Tube size: 7.5 mm Number of attempts: 1 Airway Equipment and Method: Stylet Placement Confirmation: ETT inserted through vocal cords under direct vision,  breath sounds checked- equal and bilateral and positive ETCO2 Secured at: 22 cm Tube secured with: Tape Dental Injury: Teeth and Oropharynx as per pre-operative assessment

## 2014-08-31 NOTE — Progress Notes (Signed)
RT instructed patient on IS. Patient able to demonstrate proper technique.

## 2014-08-31 NOTE — Progress Notes (Signed)
Pt had 7 beats Vtach. Pt asymptomatic; sleeping. Will continue to monitor.

## 2014-08-31 NOTE — Progress Notes (Signed)
UR Completed Elaysha Bevard Graves-Bigelow, RN,BSN [phone]  

## 2014-08-31 NOTE — Progress Notes (Signed)
CT surgery p.m. Rounds  Resting comfortably breathing spontaneously Heart rate remains 60-70 No hematoma right groin Temporary pacing wire left groin Continue current care

## 2014-08-31 NOTE — Progress Notes (Signed)
  Echocardiogram Echocardiogram Transesophageal has been performed.  [name] 08/31/2014, 2:23 PM

## 2014-09-01 ENCOUNTER — Inpatient Hospital Stay (HOSPITAL_COMMUNITY): Payer: Medicare Other

## 2014-09-01 DIAGNOSIS — Z954 Presence of other heart-valve replacement: Secondary | ICD-10-CM

## 2014-09-01 DIAGNOSIS — I350 Nonrheumatic aortic (valve) stenosis: Secondary | ICD-10-CM

## 2014-09-01 LAB — CBC
HCT: 34.9 % — ABNORMAL LOW (ref 36.0–46.0)
Hemoglobin: 11.5 g/dL — ABNORMAL LOW (ref 12.0–15.0)
MCH: 30.6 pg (ref 26.0–34.0)
MCHC: 33 g/dL (ref 30.0–36.0)
MCV: 92.8 fL (ref 78.0–100.0)
Platelets: 69 10*3/uL — ABNORMAL LOW (ref 150–400)
RBC: 3.76 MIL/uL — AB (ref 3.87–5.11)
RDW: 14.4 % (ref 11.5–15.5)
WBC: 8.8 10*3/uL (ref 4.0–10.5)

## 2014-09-01 LAB — HEMOGLOBIN A1C
Hgb A1c MFr Bld: 5.9 % — ABNORMAL HIGH (ref 4.8–5.6)
MEAN PLASMA GLUCOSE: 123 mg/dL

## 2014-09-01 LAB — BASIC METABOLIC PANEL
ANION GAP: 6 (ref 5–15)
BUN: 11 mg/dL (ref 6–20)
CO2: 22 mmol/L (ref 22–32)
CREATININE: 0.89 mg/dL (ref 0.44–1.00)
Calcium: 7.5 mg/dL — ABNORMAL LOW (ref 8.9–10.3)
Chloride: 108 mmol/L (ref 101–111)
GFR calc Af Amer: >60 mL/min (ref >60)
GFR calc non Af Amer: >60 mL/min (ref >60)
Glucose, Bld: 113 mg/dL — ABNORMAL HIGH (ref 65–99)
POTASSIUM: 4.5 mmol/L (ref 3.5–5.1)
Sodium: 136 mmol/L (ref 135–145)

## 2014-09-01 LAB — PROTIME-INR
INR: 1.35 (ref 0.00–1.49)
PROTHROMBIN TIME: 16.8 s — AB (ref 11.6–15.2)

## 2014-09-01 LAB — MAGNESIUM: Magnesium: 2.3 mg/dL (ref 1.7–2.4)

## 2014-09-01 MED ORDER — AMIODARONE LOAD VIA INFUSION
150.0000 mg | Freq: Once | INTRAVENOUS | Status: AC
  Administered 2014-09-01: 150 mg via INTRAVENOUS
  Filled 2014-09-01: qty 83.34

## 2014-09-01 MED ORDER — AMIODARONE HCL IN DEXTROSE 360-4.14 MG/200ML-% IV SOLN
INTRAVENOUS | Status: AC
  Filled 2014-09-01: qty 200

## 2014-09-01 MED ORDER — FUROSEMIDE 10 MG/ML IJ SOLN
20.0000 mg | Freq: Two times a day (BID) | INTRAMUSCULAR | Status: DC
  Administered 2014-09-01: 20 mg via INTRAVENOUS
  Filled 2014-09-01 (×3): qty 2

## 2014-09-01 MED ORDER — AMIODARONE HCL IN DEXTROSE 360-4.14 MG/200ML-% IV SOLN
30.0000 mg/h | INTRAVENOUS | Status: DC
  Administered 2014-09-01: 30 mg/h via INTRAVENOUS
  Filled 2014-09-01 (×3): qty 200

## 2014-09-01 MED ORDER — AMIODARONE HCL IN DEXTROSE 360-4.14 MG/200ML-% IV SOLN
60.0000 mg/h | INTRAVENOUS | Status: AC
  Administered 2014-09-01 (×2): 60 mg/h via INTRAVENOUS
  Filled 2014-09-01: qty 200

## 2014-09-01 MED ORDER — IRBESARTAN 75 MG PO TABS
75.0000 mg | ORAL_TABLET | Freq: Every day | ORAL | Status: DC
  Administered 2014-09-01 – 2014-09-05 (×5): 75 mg via ORAL
  Filled 2014-09-01 (×5): qty 1

## 2014-09-01 MED ORDER — TEMAZEPAM 15 MG PO CAPS
15.0000 mg | ORAL_CAPSULE | Freq: Every evening | ORAL | Status: DC | PRN
  Administered 2014-09-01 – 2014-09-03 (×3): 15 mg via ORAL
  Filled 2014-09-01 (×3): qty 1

## 2014-09-01 NOTE — Plan of Care (Signed)
Problem: Phase II - Intermediate Post-Op Goal: Advance Diet Outcome: Progressing Pt is tolerating liquid diet well, does not want to advance yet Goal: Activity Progressed Outcome: Not Progressing Pt remains on bedrest due to Left groin sheath

## 2014-09-01 NOTE — Progress Notes (Signed)
  Echocardiogram 2D Echocardiogram has been performed.  Katelyn [name] 09/01/2014, 10:33 AM

## 2014-09-01 NOTE — Progress Notes (Signed)
MD Prescott Gum made aware of lack of urniary output since the beginning of my shift. New orders received will monitor  Katelyn [name]

## 2014-09-01 NOTE — Progress Notes (Signed)
CT surgery p.m. Rounds  Patient back in sinus rhythm after starting amiodarone for Rapid atrial fibrillation  No pain in right groin which is without hematoma excellent diuresis from the 1 dose of Lasix  will remove temporary pacing wire in a.m.

## 2014-09-01 NOTE — Progress Notes (Signed)
Pt noted to convert from NSR to rate controlled afib to afib with RVR. MD Prescott Gum paged to make aware. Orders received for amio load and bolus. Will monitor.  Pt was asymptomatic with event and VSS  Lorre Munroe

## 2014-09-01 NOTE — Progress Notes (Signed)
1 Day Post-Op Procedure(s) (LRB): TRANSCATHETER AORTIC VALVE REPLACEMENT, TRANSFEMORAL approach (N/A) TRANSESOPHAGEAL ECHOCARDIOGRAM (TEE) (N/A) Subjective: Patient more alert this morning neuro intact Heart rate 70-80/m, temperature patient wire in place but not needed Right retroperitoneal incision clean without hematoma We'll give Lasix for decreased urine output and remove PA catheter Patient can be mobilized out of bed to chair after left femoral vein sheath removed for interpretation wire Objective: Vital signs in last 24 hours: Temp:  [97.2 F (36.2 C)-99.9 F (37.7 C)] 99.7 F (37.6 C) (07/02 1000) Pulse Rate:  [66-96] 84 (07/02 1000) Cardiac Rhythm:  [-] Normal sinus rhythm (07/02 1000) Resp:  [10-23] 16 (07/02 1000) BP: (98-164)/(44-85) 131/61 mmHg (07/02 1000) SpO2:  [95 %-99 %] 98 % (07/02 1000) Arterial Line BP: (127-197)/(28-65) 195/54 mmHg (07/02 0730)  Hemodynamic parameters for last 24 hours: PAP: (19-39)/(6-24) 39/24 mmHg CO:  [5.1 L/min-7.5 L/min] 6.9 L/min CI:  [2.6 L/min/m2-3.8 L/min/m2] 3.5 L/min/m2  Intake/Output from previous day: 07/01 0701 - 07/02 0700 In: 3745.3 [P.O.:240; I.V.:2455.3; IV Piggyback:1050] Out: 1525 [Urine:1525] Intake/Output this shift: Total I/O In: 152.5 [I.V.:152.5] Out: -   Neuro intact Lungs clear Extremities well-perfused  Lab Results:  Recent Labs  08/31/14 1620 09/01/14 0300  WBC 6.1 8.8  HGB 11.9* 11.5*  HCT 36.3 34.9*  PLT 75* 69*   BMET:  Recent Labs  08/31/14 0350  08/31/14 1607 09/01/14 0300  NA 140  < > 142 136  K 4.0  < > 3.9 4.5  CL 109  < > 108 108  CO2 25  --   --  22  GLUCOSE 101*  < > 116* 113*  BUN 12  < > 12 11  CREATININE 0.89  < > 0.80 0.89  CALCIUM 8.4*  --   --  7.5*  < > = values in this interval not displayed.  PT/INR:  Recent Labs  09/01/14 0300  LABPROT 16.8*  INR 1.35   ABG    Component Value Date/Time   PHART 7.338* 08/31/2014 1609   HCO3 23.8 08/31/2014 1609   TCO2  25 08/31/2014 1609   ACIDBASEDEF 2.0 08/31/2014 1609   O2SAT 91.0 08/31/2014 1609   CBG (last 3)  No results for input(s): GLUCAP in the last 72 hours.  Assessment/Plan: S/P Procedure(s) (LRB): TRANSCATHETER AORTIC VALVE REPLACEMENT, TRANSFEMORAL approach (N/A) TRANSESOPHAGEAL ECHOCARDIOGRAM (TEE) (N/A) See progression orders   LOS: 3 days    Katelyn [name] 09/01/2014

## 2014-09-02 ENCOUNTER — Inpatient Hospital Stay (HOSPITAL_COMMUNITY): Payer: Medicare Other

## 2014-09-02 DIAGNOSIS — I739 Peripheral vascular disease, unspecified: Secondary | ICD-10-CM

## 2014-09-02 DIAGNOSIS — I2510 Atherosclerotic heart disease of native coronary artery without angina pectoris: Secondary | ICD-10-CM

## 2014-09-02 DIAGNOSIS — I10 Essential (primary) hypertension: Secondary | ICD-10-CM

## 2014-09-02 DIAGNOSIS — I447 Left bundle-branch block, unspecified: Secondary | ICD-10-CM

## 2014-09-02 DIAGNOSIS — E785 Hyperlipidemia, unspecified: Secondary | ICD-10-CM

## 2014-09-02 LAB — TYPE AND SCREEN
ABO/RH(D): B POS
ANTIBODY SCREEN: NEGATIVE
Unit division: 0
Unit division: 0

## 2014-09-02 LAB — BASIC METABOLIC PANEL
Anion gap: 7 (ref 5–15)
BUN: 8 mg/dL (ref 6–20)
CALCIUM: 7.9 mg/dL — AB (ref 8.9–10.3)
CO2: 29 mmol/L (ref 22–32)
Chloride: 103 mmol/L (ref 101–111)
Creatinine, Ser: 0.99 mg/dL (ref 0.44–1.00)
GFR calc Af Amer: >60 mL/min (ref >60)
GFR calc non Af Amer: 56 mL/min — ABNORMAL LOW (ref >60)
Glucose, Bld: 113 mg/dL — ABNORMAL HIGH (ref 65–99)
Potassium: 3.4 mmol/L — ABNORMAL LOW (ref 3.5–5.1)
Sodium: 139 mmol/L (ref 135–145)

## 2014-09-02 LAB — PROTIME-INR
INR: 1.3 (ref 0.00–1.49)
Prothrombin Time: 16.3 seconds — ABNORMAL HIGH (ref 11.6–15.2)

## 2014-09-02 LAB — GLUCOSE, CAPILLARY: GLUCOSE-CAPILLARY: 94 mg/dL (ref 65–99)

## 2014-09-02 MED ORDER — PHENOL 1.4 % MT LIQD: 1.0000 | OROMUCOSAL | Status: DC | PRN

## 2014-09-02 MED ORDER — WARFARIN SODIUM 2.5 MG PO TABS
2.5000 mg | ORAL_TABLET | Freq: Every day | ORAL | Status: DC
  Administered 2014-09-02: 2.5 mg via ORAL
  Filled 2014-09-02 (×2): qty 1

## 2014-09-02 MED ORDER — INSULIN ASPART 100 UNIT/ML ~~LOC~~ SOLN: 0.0000 [IU] | SUBCUTANEOUS | Status: DC

## 2014-09-02 MED ORDER — AMIODARONE HCL 200 MG PO TABS
200.0000 mg | ORAL_TABLET | Freq: Every day | ORAL | Status: DC
  Administered 2014-09-02 – 2014-09-03 (×2): 200 mg via ORAL
  Filled 2014-09-02 (×2): qty 1

## 2014-09-02 MED ORDER — ASPIRIN EC 81 MG PO TBEC
81.0000 mg | DELAYED_RELEASE_TABLET | Freq: Every day | ORAL | Status: DC
  Administered 2014-09-03 – 2014-09-05 (×3): 81 mg via ORAL
  Filled 2014-09-02 (×3): qty 1

## 2014-09-02 MED ORDER — POTASSIUM CHLORIDE 10 MEQ/50ML IV SOLN
10.0000 meq | INTRAVENOUS | Status: AC
  Administered 2014-09-02 (×3): 10 meq via INTRAVENOUS
  Filled 2014-09-02 (×3): qty 50

## 2014-09-02 MED ORDER — WARFARIN - PHYSICIAN DOSING INPATIENT
Freq: Every day | Status: DC
  Administered 2014-09-03: 1

## 2014-09-02 NOTE — Progress Notes (Signed)
CT Surgery PM Rounds  Patient examined and record reviewed.Hemodynamics stable,labs satisfactory.Patient had stable day.Continue current care. With postop afib will start coumadin and hold plavix Tharon Aquas Trigt III 09/02/2014

## 2014-09-02 NOTE — Progress Notes (Signed)
SUBJECTIVE: Feels well. Denies chest pain and shortness of breath. Had been in atrial fibrillation earlier. Amiodarone started and reverted back to sinus rhythm. Pacing currently with plans to remove wire today as per CTS notes.     Intake/Output Summary (Last 24 hours) at 09/02/14 1037 Last data filed at 09/02/14 0930  Gross per 24 hour  Intake  933.8 ml  Output   4380 ml  Net -3446.2 ml    Current Facility-Administered Medications  Medication Dose Route Frequency Provider Last Rate Last Dose  . acetaminophen (TYLENOL) tablet 1,000 mg  1,000 mg Oral 4 times per day Burnell Blanks, MD   1,000 mg at 09/01/14 2213   Or  . acetaminophen (TYLENOL) solution 1,000 mg  1,000 mg Per Tube 4 times per day Burnell Blanks, MD      . amiodarone (NEXTERONE PREMIX) 360 MG/200ML (1.8 mg/mL) IV infusion  30 mg/hr Intravenous Continuous Ivin Poot, MD 16.7 mL/hr at 09/02/14 0800 30 mg/hr at 09/02/14 0800  . amiodarone (PACERONE) tablet 200 mg  200 mg Oral Daily Ivin Poot, MD   200 mg at 09/02/14 0929  . aspirin EC tablet 325 mg  325 mg Oral Daily Burnell Blanks, MD   325 mg at 09/02/14 1610   Or  . aspirin chewable tablet 324 mg  324 mg Per Tube Daily Burnell Blanks, MD      . chlorhexidine (HIBICLENS) 4 % liquid   Topical Once Troy Sine, MD      . clopidogrel (PLAVIX) tablet 75 mg  75 mg Oral Q breakfast Burnell Blanks, MD   75 mg at 09/02/14 0827  . estrogens (conjugated) (PREMARIN) tablet 1.25 mg  1.25 mg Oral Daily Brett Canales, PA-C   1.25 mg at 09/02/14 9604  . hydrALAZINE (APRESOLINE) injection 20 mg  20 mg Intravenous Q4H PRN Burnell Blanks, MD      . irbesartan (AVAPRO) tablet 75 mg  75 mg Oral Daily Ivin Poot, MD   75 mg at 09/02/14 0929  . labetalol (NORMODYNE,TRANDATE) injection 10 mg  10 mg Intravenous Q2H PRN Rexene Alberts, MD   10 mg at 08/31/14 1650  . metoprolol (LOPRESSOR) injection 2.5-5 mg  2.5-5 mg  Intravenous Q2H PRN Burnell Blanks, MD   5 mg at 09/01/14 1249  . morphine 2 MG/ML injection 2-5 mg  2-5 mg Intravenous Q1H PRN Burnell Blanks, MD   4 mg at 08/31/14 1630  . ondansetron (ZOFRAN) injection 4 mg  4 mg Intravenous Q6H PRN Burnell Blanks, MD   4 mg at 08/31/14 2300  . oxyCODONE (Oxy IR/ROXICODONE) immediate release tablet 5-10 mg  5-10 mg Oral Q3H PRN Burnell Blanks, MD      . pantoprazole (PROTONIX) EC tablet 40 mg  40 mg Oral Daily Burnell Blanks, MD   40 mg at 09/02/14 0929  . phenol (CHLORASEPTIC) mouth spray 1 spray  1 spray Mouth/Throat PRN Ivin Poot, MD      . phenylephrine (NEO-SYNEPHRINE) 20 mg in dextrose 5 % 250 mL (0.08 mg/mL) infusion  0-100 mcg/min Intravenous Titrated Burnell Blanks, MD   Stopped at 08/31/14 1545  . potassium chloride (K-DUR,KLOR-CON) CR tablet 10 mEq  10 mEq Oral Daily PRN Troy Sine, MD      . rosuvastatin (CRESTOR) tablet 20 mg  20 mg Oral q1800 Brett Canales, PA-C   20 mg at  09/01/14 1733  . temazepam (RESTORIL) capsule 15 mg  15 mg Oral QHS PRN Ivin Poot, MD   15 mg at 09/01/14 2213  . traMADol (ULTRAM) tablet 50-100 mg  50-100 mg Oral Q4H PRN Burnell Blanks, MD      . warfarin (COUMADIN) tablet 2.5 mg  2.5 mg Oral q1800 Ivin Poot, MD      . Warfarin - Physician Dosing Inpatient   Does not apply q1800 Ivin Poot, MD        Filed Vitals:   09/02/14 0700 09/02/14 0800 09/02/14 0804 09/02/14 0900  BP: 134/99 121/100  123/66  Pulse: 121 84  112  Temp:   97.6 F (36.4 C)   TempSrc:   Oral   Resp: '21 17  22  '$ Height:      Weight:      SpO2: 94% 94%  96%    PHYSICAL EXAM General: NAD HEENT: Normal. Neck: No JVD, no thyromegaly.  Lungs: Clear to auscultation bilaterally with normal respiratory effort. CV: Nondisplaced PMI.  Regular rate and rhythm, normal S1/crisp S2, no O2/V0, 1/6 systolic murmur over RUSB.  No pretibial edema.    Abdomen: Soft, nontender, no  distention.  Neurologic: Alert and oriented x 3.  Psych: Normal affect. Musculoskeletal: No gross deformities. Extremities: No clubbing or cyanosis.   TELEMETRY: Reviewed telemetry pt in paced rhythm. Atrial fibrillation earlier.  LABS: Basic Metabolic Panel:  Recent Labs  09/01/14 0300 09/02/14 0410  NA 136 139  K 4.5 3.4*  CL 108 103  CO2 22 29  GLUCOSE 113* 113*  BUN 11 8  CREATININE 0.89 0.99  CALCIUM 7.5* 7.9*  MG 2.3  --    Liver Function Tests: No results for input(s): AST, ALT, ALKPHOS, BILITOT, PROT, ALBUMIN in the last 72 hours. No results for input(s): LIPASE, AMYLASE in the last 72 hours. CBC:  Recent Labs  08/31/14 1620 09/01/14 0300  WBC 6.1 8.8  HGB 11.9* 11.5*  HCT 36.3 34.9*  MCV 93.8 92.8  PLT 75* 69*   Cardiac Enzymes: No results for input(s): CKTOTAL, CKMB, CKMBINDEX, TROPONINI in the last 72 hours. BNP: Invalid input(s): POCBNP D-Dimer: No results for input(s): DDIMER in the last 72 hours. Hemoglobin A1C:  Recent Labs  08/31/14 0350  HGBA1C 5.9*   Fasting Lipid Panel: No results for input(s): CHOL, HDL, LDLCALC, TRIG, CHOLHDL, LDLDIRECT in the last 72 hours. Thyroid Function Tests: No results for input(s): TSH, T4TOTAL, T3FREE, THYROIDAB in the last 72 hours.  Invalid input(s): FREET3 Anemia Panel: No results for input(s): VITAMINB12, FOLATE, FERRITIN, TIBC, IRON, RETICCTPCT in the last 72 hours.  RADIOLOGY: Dg Chest 2 View  08/29/2014   CLINICAL DATA:  Preoperative chest radiograph. Aortic valve replacement.  EXAM: CHEST  2 VIEW  COMPARISON:  CT 08/08/2014.  FINDINGS: Emphysema. Bilateral pleural apical scarring demonstrated on prior chest CT. Aortic atherosclerosis. No airspace disease. No pleural effusion. 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  IMPRESSION: No acute cardiopulmonary disease. Emphysema and bilateral chronic pleural apical scarring.   Electronically Signed   By: Dereck Ligas M.D.   On:  08/29/2014 12:35   Ct Coronary Morp W/cta Cor W/score W/ca W/cm &/or Wo/cm  08/08/2014   ADDENDUM REPORT: 08/08/2014 19:16  CLINICAL DATA:  74 year old female with severe aortic stenosis.  EXAM: Cardiac TAVR CT  TECHNIQUE: The patient was scanned on a Philips 256 scanner. A 120 kV retrospective scan was triggered in the descending thoracic aorta at 111 HU's.  Gantry rotation speed was 270 msecs and collimation was .9 mm. 2.5 of iv Metoprolol and no nitro were given. The 3D data set was reconstructed in 5% intervals of the R-R cycle. Systolic and diastolic phases were analyzed on a dedicated work station using MPR, MIP and VRT modes. The patient received 80 cc of contrast.  FINDINGS: Aortic Valve: Trileaflet, severely thickened, moderately calcified with severely restricted leaflet excursions.  Aorta: Normal caliber, no dissection. There are severe diffuse calcifications in the entire thoracic aorta predominantly in the aortic arch and descending thoracic aorta (no porcelain aorta).  Sinotubular Junction:  30 x 29 mm  Ascending Thoracic Aorta:  32 x 32 mm  Aortic Arch:  27 x 24 mm  Descending Thoracic Aorta:  25 x 23 mm  Sinus of Valsalva Measurements:  Non-coronary:  35 mm  Right -coronary:  35 mm  Left -coronary:  36 mm  Coronary Artery Height above Annulus:  Left Main:  14 mm  Right Coronary:  18 mm  Virtual Basal Annulus Measurements:  Maximum/Minimum Diameter:  28 x 24 mm  Perimeter:  99 mm  Area:  551 mm2  Coronary Arteries: The study suboptimal for coronary evaluation performed without use of NTG.  Optimum Fluoroscopic Angle for Delivery:  LAO 10 CAU 4  IMPRESSION: 1. Severely thickened and moderately calcified aortic valve with restricted leaflet opening and annular measurements suitable for delivery of 29 mm Edward-SAPIEN 3 valve.  2.  Sufficient annulus to coronary distance.  3. Optimum fluoroscopic angle for delivery LAO 10 CAU 4.  4. Severe diffuse calcifications in the entire thoracic aorta  predominantly in the aortic arch and descending thoracic aorta.  Ena Dawley   Electronically Signed   By: Ena Dawley   On: 08/08/2014 19:16   08/08/2014   EXAM: OVER-READ INTERPRETATION  CT CHEST  The following report is an over-read performed by radiologist Dr. Rebekah Chesterfield [facility] on 08/08/2014. This over-read does not include interpretation of cardiac or coronary anatomy or pathology. The coronary calcium score/coronary CTA interpretation by the cardiologist is attached.  COMPARISON:  No priors.  FINDINGS: Extracardiac findings will be fully described on separate dictation for a contemporaneously obtained CTA of the chest abdomen and pelvis.  IMPRESSION: See separate dictation for full description of extracardiac findings associated with the report for the CTA of the chest, abdomen and pelvis performed 08/08/2014.  Electronically Signed: By: Vinnie Langton M.D. On: 08/08/2014 15:30   Dg Chest Port 1 View  09/02/2014   CLINICAL DATA:  Status post valve replacement.  Shortness of Breath  EXAM: PORTABLE CHEST - 1 VIEW  COMPARISON:  09/01/2014  FINDINGS: Changes of aortic valve replacement. Interval removal of Swan-Ganz catheter. Pacer entering from the IVC remains in the right ventricle, stable. Heart is upper limits normal in size. Biapical scarring. No acute airspace opacities. No effusions or pneumothorax.  IMPRESSION: No acute findings.  Biapical scarring.  No pneumothorax.   Electronically Signed   By: Rolm Baptise M.D.   On: 09/02/2014 07:42   Dg Chest Port 1 View  09/01/2014   CLINICAL DATA:  One day post transcatheter aortic valve replacement. Shortness of breath.  EXAM: PORTABLE CHEST - 1 VIEW  COMPARISON:  08/31/2014 and earlier.  FINDINGS: Aortic valve prosthesis, unchanged. Right femoral temporary pacing wire at the expected location of the RV apex, unchanged. Right jugular Swan-Ganz catheter tip projects at the expected level of the pulmonary outflow tract or  proximal main pulmonary trunk, unchanged.  Cardiac silhouette upper normal in size to slightly enlarged, unchanged. Lungs clear. No pleural effusions.  IMPRESSION: 1. Support apparatus satisfactory. 2.  No acute cardiopulmonary disease.   Electronically Signed   By: Evangeline Dakin M.D.   On: 09/01/2014 07:33   Dg Chest Port 1 View  08/31/2014   CLINICAL DATA:  Status post transcatheter aortic valve replacement  EXAM: PORTABLE CHEST - 1 VIEW  COMPARISON:  PA and lateral chest x-ray of August 29, 2014  FINDINGS: The lungs are well-expanded. There is no focal infiltrate. There is no pleural effusion or pneumothorax. The cardiopericardial silhouette is mildly enlarged. The it cage from the transcatheter aortic valve placement is in reasonable radiographic position. There is a Swan-Ganz catheter in place via the right internal jugular approach whose tip lies in the region of the proximal pulmonary outflow tract. There is an additional catheter present placed via femoral produce that has its tip overlying the right ventricular apex. The bony thorax exhibits no acute abnormality.  IMPRESSION: 1. There is no postprocedure complication followingtranscatheter aortic valve replacement. The vascular support catheters are in place as described. 2. Underlying COPD.   Electronically Signed   By: David  Martinique M.D.   On: 08/31/2014 15:59   Ct Angio Chest Aorta W/cm &/or Wo/cm  08/08/2014   CLINICAL DATA:  74 year old female with history of severe aortic stenosis. Preprocedural study prior to potential transcatheter aortic valve replacement (TAVR).  EXAM: CT ANGIOGRAPHY CHEST, ABDOMEN AND PELVIS  TECHNIQUE: Multidetector CT imaging through the chest, abdomen and pelvis was performed using the standard protocol during bolus administration of intravenous contrast. Multiplanar reconstructed images and MIPs were obtained and reviewed to evaluate the vascular anatomy.  CONTRAST:  35m OMNIPAQUE IOHEXOL 350 MG/ML SOLN, 73mOMNIPAQUE  IOHEXOL 350 MG/ML SOLN  COMPARISON:  CTA of the abdomen and pelvis 10/27/2010.  FINDINGS: CTA CHEST FINDINGS  Mediastinum/Lymph Nodes: Heart size is borderline enlarged with concentric left ventricular hypertrophy. There is no significant pericardial fluid, thickening or pericardial calcification. Severe thickening calcifications of the aortic valve. There is atherosclerosis of the thoracic aorta, the great vessels of the mediastinum and the coronary arteries, including calcified atherosclerotic plaque in the left main, left anterior descending, left circumflex and right coronary arteries. No pathologically enlarged mediastinal or hilar lymph nodes. Numerous densely calcified right hilar and mediastinal lymph nodes are noted. Esophagus is unremarkable in appearance. No axillary lymphadenopathy.  Lungs/Pleura: Several calcified pulmonary nodules are noted in the lungs bilaterally, compatible with benign granulomas. 4 mm ground-glass attenuation nodule in the superior segment of the left lower lobe (image 21 of series 407). No other larger more suspicious appearing pulmonary nodules or masses are otherwise noted. Bilateral apical pleuroparenchymal thickening, somewhat nodular in appearance, presumably areas of post infectious or inflammatory scarring. Dependent ground-glass attenuation in the posterior aspect of the right lower lobe may reflect a focus of infection or sequela of recent mild aspiration. Mild diffuse bronchial wall thickening with mild centrilobular and paraseptal emphysema. No pleural effusions.  Musculoskeletal/Soft Tissues: There are no aggressive appearing lytic or blastic lesions noted in the visualized portions of the skeleton.  CTA ABDOMEN AND PELVIS FINDINGS  Hepatobiliary: No suspicious appearing cystic or solid hepatic lesions are noted. No intra or extrahepatic biliary ductal dilatation. Tiny calcified gallstones layer dependently in the gallbladder. No signs of acute cholecystitis at this  time.  Pancreas: No pancreatic mass. No pancreatic ductal dilatation. No pancreatic or peripancreatic fluid or inflammatory changes.  Spleen: Unremarkable.  Adrenals/Urinary Tract: Bilateral  adrenal glands and bilateral kidneys are normal in appearance. No hydroureteronephrosis. Urinary bladder is normal in appearance.  Stomach/Bowel: Normal appearance of the stomach. No pathologic dilatation of small bowel or colon. Normal appendix.  Vascular/Lymphatic: Vascular findings and measurements pertinent to potential TAVR procedure, as detailed below. As described, patient is status post aortobifemoral bypass graft, which is widely patent. 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 Retroaortic left renal vein (normal anatomical variant) incidentally noted. No lymphadenopathy noted in the abdomen or pelvis.  Reproductive: Status post hysterectomy. Ovaries are not confidently identified may be surgically absent or atrophic.  Other: No significant volume of ascites. No pneumoperitoneum. Surgical clips in the left inguinal region. Immediately superficial to this there is a well-circumscribed 2.6 x 2.3 cm low-attenuation lesion which likely represents a chronic postoperative fluid collections such as a seroma; this finding is smaller than remote prior study 10/27/2010.  Musculoskeletal: Well-circumscribed lucent lesion with sclerotic margins and narrow zone of transition in the intertrochanteric region of the left femur measures approximately 2.2 cm in diameter and is very similar to remote prior study 10/27/2010, and although indeterminate, is presumably benign. There are no other aggressive appearing lytic or blastic lesions noted in the visualized portions of the skeleton.  VASCULAR MEASUREMENTS PERTINENT TO TAVR:  AORTA:  Minimal Aortic Diameter -  14 x 16 mm  Severity of Aortic Calcification - Moderate to severe in the native abdominal aorta which is  completely occluded in the infrarenal region, replaced by the aortobifemoral bypass graft.  COMMENT: The patient is status post aortobifemoral bypass graft. The bypass graft is widely patent, and the measurements indicated below reflect the portions of the graft which replace the corresponding portions of the native pelvic vessels. The native common iliac arteries are occluded bilaterally, while there is reconstituted flow in the native external iliac arteries and native common femoral arteries bilaterally, these vessels are diminutive in size and severely diseased with atherosclerosis.  RIGHT PELVIS:  Right Common Iliac Artery -  Minimal Diameter - 9.2 x 7.6 mm  Tortuosity - minimal  Calcification - none  Right External Iliac Artery -  Minimal Diameter - 10.3 x 10.3 mm  Tortuosity - minimal  Calcification - none  Right Common Femoral Artery -  Minimal Diameter - 10.1 x 9.4 mm  Tortuosity - minimal  Calcification - none  LEFT PELVIS:  Left Common Iliac Artery -  Minimal Diameter - 10.1 x 10.1 mm  Tortuosity - mild  Calcification - none  Left External Iliac Artery -  Minimal Diameter - 10.1 x 10.9 mm  Tortuosity - minimal  Calcification - none  Left Common Femoral Artery -  Minimal Diameter - 10.4 x 11.1 mm  Tortuosity - minimal  Calcification - none  Review of the MIP images confirms the above findings.  IMPRESSION: 1. Status post aortobifemoral bypass graft placement. The bypass graft is widely patent. Assuming vascular access is acceptable via the graft rather than the native vessels, this patient does appear to have suitable pelvic arterial access, as above. 2. Severe thickening in calcifications of the aortic valve, compatible with the reported clinical history of aortic stenosis. This is associated with some concentric left ventricular hypertrophy. 3. Dependent ground-glass attenuation airspace disease in the right lower lobe likely reflects a mild infection, or sequela of recent aspiration. Clinical  correlation is recommended. 4. Mild diffuse bronchial wall thickening with mild centrilobular and paraseptal emphysema; imaging findings suggestive of underlying COPD. 5. Small  resolving postoperative fluid collection in the left inguinal region is presumably a resolving seroma or old chronic hematoma. 6. Additional incidental findings, as above.   Electronically Signed   By: Vinnie Langton M.D.   On: 08/08/2014 15:52   Ct Angio Abd/pel W/ And/or W/o  08/08/2014   CLINICAL DATA:  74 year old female with history of severe aortic stenosis. Preprocedural study prior to potential transcatheter aortic valve replacement (TAVR).  EXAM: CT ANGIOGRAPHY CHEST, ABDOMEN AND PELVIS  TECHNIQUE: Multidetector CT imaging through the chest, abdomen and pelvis was performed using the standard protocol during bolus administration of intravenous contrast. Multiplanar reconstructed images and MIPs were obtained and reviewed to evaluate the vascular anatomy.  CONTRAST:  72m OMNIPAQUE IOHEXOL 350 MG/ML SOLN, 724mOMNIPAQUE IOHEXOL 350 MG/ML SOLN  COMPARISON:  CTA of the abdomen and pelvis 10/27/2010.  FINDINGS: CTA CHEST FINDINGS  Mediastinum/Lymph Nodes: Heart size is borderline enlarged with concentric left ventricular hypertrophy. There is no significant pericardial fluid, thickening or pericardial calcification. Severe thickening calcifications of the aortic valve. There is atherosclerosis of the thoracic aorta, the great vessels of the mediastinum and the coronary arteries, including calcified atherosclerotic plaque in the left main, left anterior descending, left circumflex and right coronary arteries. No pathologically enlarged mediastinal or hilar lymph nodes. Numerous densely calcified right hilar and mediastinal lymph nodes are noted. Esophagus is unremarkable in appearance. No axillary lymphadenopathy.  Lungs/Pleura: Several calcified pulmonary nodules are noted in the lungs bilaterally, compatible with benign granulomas.  4 mm ground-glass attenuation nodule in the superior segment of the left lower lobe (image 21 of series 407). No other larger more suspicious appearing pulmonary nodules or masses are otherwise noted. Bilateral apical pleuroparenchymal thickening, somewhat nodular in appearance, presumably areas of post infectious or inflammatory scarring. Dependent ground-glass attenuation in the posterior aspect of the right lower lobe may reflect a focus of infection or sequela of recent mild aspiration. Mild diffuse bronchial wall thickening with mild centrilobular and paraseptal emphysema. No pleural effusions.  Musculoskeletal/Soft Tissues: There are no aggressive appearing lytic or blastic lesions noted in the visualized portions of the skeleton.  CTA ABDOMEN AND PELVIS FINDINGS  Hepatobiliary: No suspicious appearing cystic or solid hepatic lesions are noted. No intra or extrahepatic biliary ductal dilatation. Tiny calcified gallstones layer dependently in the gallbladder. No signs of acute cholecystitis at this time.  Pancreas: No pancreatic mass. No pancreatic ductal dilatation. No pancreatic or peripancreatic fluid or inflammatory changes.  Spleen: Unremarkable.  Adrenals/Urinary Tract: Bilateral adrenal glands and bilateral kidneys are normal in appearance. No hydroureteronephrosis. Urinary bladder is normal in appearance.  Stomach/Bowel: Normal appearance of the stomach. No pathologic dilatation of small bowel or colon. Normal appendix.  Vascular/Lymphatic: Vascular findings and measurements pertinent to potential TAVR procedure, as detailed below. As described, patient is status post aortobifemoral bypass graft, which is widely patent. 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 Retroaortic left renal vein (normal anatomical variant) incidentally noted. No lymphadenopathy noted in the abdomen or pelvis.  Reproductive: Status post hysterectomy. Ovaries  are not confidently identified may be surgically absent or atrophic.  Other: No significant volume of ascites. No pneumoperitoneum. Surgical clips in the left inguinal region. Immediately superficial to this there is a well-circumscribed 2.6 x 2.3 cm low-attenuation lesion which likely represents a chronic postoperative fluid collections such as a seroma; this finding is smaller than remote prior study 10/27/2010.  Musculoskeletal: Well-circumscribed lucent lesion with sclerotic margins and narrow zone of transition  in the intertrochanteric region of the left femur measures approximately 2.2 cm in diameter and is very similar to remote prior study 10/27/2010, and although indeterminate, is presumably benign. There are no other aggressive appearing lytic or blastic lesions noted in the visualized portions of the skeleton.  VASCULAR MEASUREMENTS PERTINENT TO TAVR:  AORTA:  Minimal Aortic Diameter -  14 x 16 mm  Severity of Aortic Calcification - Moderate to severe in the native abdominal aorta which is completely occluded in the infrarenal region, replaced by the aortobifemoral bypass graft.  COMMENT: The patient is status post aortobifemoral bypass graft. The bypass graft is widely patent, and the measurements indicated below reflect the portions of the graft which replace the corresponding portions of the native pelvic vessels. The native common iliac arteries are occluded bilaterally, while there is reconstituted flow in the native external iliac arteries and native common femoral arteries bilaterally, these vessels are diminutive in size and severely diseased with atherosclerosis.  RIGHT PELVIS:  Right Common Iliac Artery -  Minimal Diameter - 9.2 x 7.6 mm  Tortuosity - minimal  Calcification - none  Right External Iliac Artery -  Minimal Diameter - 10.3 x 10.3 mm  Tortuosity - minimal  Calcification - none  Right Common Femoral Artery -  Minimal Diameter - 10.1 x 9.4 mm  Tortuosity - minimal  Calcification - none   LEFT PELVIS:  Left Common Iliac Artery -  Minimal Diameter - 10.1 x 10.1 mm  Tortuosity - mild  Calcification - none  Left External Iliac Artery -  Minimal Diameter - 10.1 x 10.9 mm  Tortuosity - minimal  Calcification - none  Left Common Femoral Artery -  Minimal Diameter - 10.4 x 11.1 mm  Tortuosity - minimal  Calcification - none  Review of the MIP images confirms the above findings.  IMPRESSION: 1. Status post aortobifemoral bypass graft placement. The bypass graft is widely patent. Assuming vascular access is acceptable via the graft rather than the native vessels, this patient does appear to have suitable pelvic arterial access, as above. 2. Severe thickening in calcifications of the aortic valve, compatible with the reported clinical history of aortic stenosis. This is associated with some concentric left ventricular hypertrophy. 3. Dependent ground-glass attenuation airspace disease in the right lower lobe likely reflects a mild infection, or sequela of recent aspiration. Clinical correlation is recommended. 4. Mild diffuse bronchial wall thickening with mild centrilobular and paraseptal emphysema; imaging findings suggestive of underlying COPD. 5. Small resolving postoperative fluid collection in the left inguinal region is presumably a resolving seroma or old chronic hematoma. 6. Additional incidental findings, as above.   Electronically Signed   By: Vinnie Langton M.D.   On: 08/08/2014 15:52      ASSESSMENT AND PLAN: 1. Severe aortic stenosis s/p TAVR on 7/1: Symptomatically stable. Continue ASA and Plavix.  2. CAD: Symptomatically stable. On metoprolol as outpatient, currently being given IV prn. Continue Crestor.  3. Essential HTN: Normal today. Continue irbesartan.  4. PVD s/p aortobifemoral bypass and b/l profundoplasty: Stable. Continue ASA, Plavix, and statin.  5. 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   Kate Sable, M.D., F.A.C.C.

## 2014-09-02 NOTE — Progress Notes (Signed)
2 Days Post-Op Procedure(s) (LRB): TRANSCATHETER AORTIC VALVE REPLACEMENT, TRANSFEMORAL approach (N/A) TRANSESOPHAGEAL ECHOCARDIOGRAM (TEE) (N/A) Subjective: Rapid atrial fibrillation yesterday converted to sinus rhythm with IV amiodarone protocol--heart rate has been greater than 70 bpm without postoperative bradycardia We'll start oral amiodarone and stop IV infusion Temporary transvenous RV pacing wire and femoral vein sheath removed today so patient can be mobilize out of bed Coumadin dosing has started postop-2.5 mg daily  Objective: Vital signs in last 24 hours: Temp:  [97.6 F (36.4 C)-98.3 F (36.8 C)] 97.6 F (36.4 C) (07/03 0804) Pulse Rate:  [69-134] 80 (07/03 1100) Cardiac Rhythm:  [-] Normal sinus rhythm (07/03 1100) Resp:  [17-26] 17 (07/03 1100) BP: (86-154)/(38-100) 148/56 mmHg (07/03 1100) SpO2:  [91 %-96 %] 96 % (07/03 1100) Weight:  [179 lb 0.2 oz (81.2 kg)] 179 lb 0.2 oz (81.2 kg) (07/03 0600)  Hemodynamic parameters for last 24 hours:   stable  Intake/Output from previous day: 07/02 0701 - 07/03 0700 In: 1002.9 [P.O.:100; I.V.:702.9; IV Piggyback:200] Out: 4030 [Urine:4030] Intake/Output this shift: Total I/O In: 166.8 [I.V.:66.8; IV Piggyback:100] Out: 730 [Urine:730]  Right lower quadrant surgical incision clean and dry without hematoma  Lab Results:  Recent Labs  08/31/14 1620 09/01/14 0300  WBC 6.1 8.8  HGB 11.9* 11.5*  HCT 36.3 34.9*  PLT 75* 69*   BMET:  Recent Labs  09/01/14 0300 09/02/14 0410  NA 136 139  K 4.5 3.4*  CL 108 103  CO2 22 29  GLUCOSE 113* 113*  BUN 11 8  CREATININE 0.89 0.99  CALCIUM 7.5* 7.9*    PT/INR:  Recent Labs  09/02/14 0410  LABPROT 16.3*  INR 1.30   ABG    Component Value Date/Time   PHART 7.338* 08/31/2014 1609   HCO3 23.8 08/31/2014 1609   TCO2 25 08/31/2014 1609   ACIDBASEDEF 2.0 08/31/2014 1609   O2SAT 91.0 08/31/2014 1609   CBG (last 3)  No results for input(s): GLUCAP in the last 72  hours.  Assessment/Plan: S/P Procedure(s) (LRB): TRANSCATHETER AORTIC VALVE REPLACEMENT, TRANSFEMORAL approach (N/A) TRANSESOPHAGEAL ECHOCARDIOGRAM (TEE) (N/A) Mobilize Diuresis See progression orders   LOS: 4 days    Tharon Aquas Trigt III 09/02/2014

## 2014-09-03 ENCOUNTER — Inpatient Hospital Stay (HOSPITAL_COMMUNITY): Payer: Medicare Other

## 2014-09-03 LAB — CBC
HCT: 37.3 % (ref 36.0–46.0)
Hemoglobin: 12.2 g/dL (ref 12.0–15.0)
MCH: 30.7 pg (ref 26.0–34.0)
MCHC: 32.7 g/dL (ref 30.0–36.0)
MCV: 94 fL (ref 78.0–100.0)
Platelets: 56 10*3/uL — ABNORMAL LOW (ref 150–400)
RBC: 3.97 MIL/uL (ref 3.87–5.11)
RDW: 14.3 % (ref 11.5–15.5)
WBC: 8 10*3/uL (ref 4.0–10.5)

## 2014-09-03 LAB — BASIC METABOLIC PANEL
ANION GAP: 8 (ref 5–15)
BUN: 12 mg/dL (ref 6–20)
CHLORIDE: 105 mmol/L (ref 101–111)
CO2: 25 mmol/L (ref 22–32)
Calcium: 8 mg/dL — ABNORMAL LOW (ref 8.9–10.3)
Creatinine, Ser: 1.04 mg/dL — ABNORMAL HIGH (ref 0.44–1.00)
GFR calc Af Amer: >60 mL/min (ref >60)
GFR calc non Af Amer: 53 mL/min — ABNORMAL LOW (ref >60)
GLUCOSE: 112 mg/dL — AB (ref 65–99)
POTASSIUM: 3.9 mmol/L (ref 3.5–5.1)
Sodium: 138 mmol/L (ref 135–145)

## 2014-09-03 LAB — PROTIME-INR
INR: 1.2 (ref 0.00–1.49)
PROTHROMBIN TIME: 15.3 s — AB (ref 11.6–15.2)

## 2014-09-03 MED ORDER — SODIUM CHLORIDE 0.9 % IJ SOLN: 3.0000 mL | INTRAMUSCULAR | Status: DC | PRN

## 2014-09-03 MED ORDER — AMIODARONE HCL 200 MG PO TABS
200.0000 mg | ORAL_TABLET | Freq: Two times a day (BID) | ORAL | Status: DC
  Administered 2014-09-03: 200 mg via ORAL
  Filled 2014-09-03 (×3): qty 1

## 2014-09-03 MED ORDER — METOPROLOL TARTRATE 25 MG PO TABS
25.0000 mg | ORAL_TABLET | Freq: Two times a day (BID) | ORAL | Status: DC
  Administered 2014-09-03 (×2): 25 mg via ORAL
  Filled 2014-09-03 (×4): qty 1

## 2014-09-03 MED ORDER — WARFARIN SODIUM 3 MG PO TABS
3.0000 mg | ORAL_TABLET | Freq: Every day | ORAL | Status: DC
  Administered 2014-09-03 – 2014-09-04 (×2): 3 mg via ORAL
  Filled 2014-09-03 (×3): qty 1

## 2014-09-03 MED ORDER — SODIUM CHLORIDE 0.9 % IJ SOLN
3.0000 mL | Freq: Two times a day (BID) | INTRAMUSCULAR | Status: DC
  Administered 2014-09-03 – 2014-09-04 (×3): 3 mL via INTRAVENOUS

## 2014-09-03 NOTE — Progress Notes (Signed)
3 Days Post-Op Procedure(s) (LRB): TRANSCATHETER AORTIC VALVE REPLACEMENT, TRANSFEMORAL approach (N/A) TRANSESOPHAGEAL ECHOCARDIOGRAM (TEE) (N/A) Subjective: Back in PAF 120 - no w on inc dose amio,po metoprolol, and coumadin loading   -  feels great  Objective: Vital signs in last 24 hours: Temp:  [97.5 F (36.4 C)-98.2 F (36.8 C)] 98 F (36.7 C) (07/04 0743) Pulse Rate:  [36-127] 127 (07/04 0825) Cardiac Rhythm:  [-] Atrial flutter (07/04 0820) Resp:  [13-24] 18 (07/04 0825) BP: (93-148)/(29-83) 127/72 mmHg (07/04 0800) SpO2:  [93 %-97 %] 96 % (07/04 0825) Weight:  [174 lb (78.926 kg)] 174 lb (78.926 kg) (07/04 0545)  Hemodynamic parameters for last 24 hours:  stable  Intake/Output from previous day: 07/03 0701 - 07/04 0700 In: 406.8 [P.O.:240; I.V.:66.8; IV Piggyback:100] Out: 1920 [Urine:1920] Intake/Output this shift:    No groin hematoma  Lab Results:  Recent Labs  09/01/14 0300 09/03/14 0205  WBC 8.8 8.0  HGB 11.5* 12.2  HCT 34.9* 37.3  PLT 69* 56*   BMET:  Recent Labs  09/02/14 0410 09/03/14 0205  NA 139 138  K 3.4* 3.9  CL 103 105  CO2 29 25  GLUCOSE 113* 112*  BUN 8 12  CREATININE 0.99 1.04*  CALCIUM 7.9* 8.0*    PT/INR:  Recent Labs  09/03/14 0205  LABPROT 15.3*  INR 1.20   ABG    Component Value Date/Time   PHART 7.338* 08/31/2014 1609   HCO3 23.8 08/31/2014 1609   TCO2 25 08/31/2014 1609   ACIDBASEDEF 2.0 08/31/2014 1609   O2SAT 91.0 08/31/2014 1609   CBG (last 3)   Recent Labs  09/02/14 1245  GLUCAP 94    Assessment/Plan: S/P Procedure(s) (LRB): TRANSCATHETER AORTIC VALVE REPLACEMENT, TRANSFEMORAL approach (N/A) TRANSESOPHAGEAL ECHOCARDIOGRAM (TEE) (N/A) tx stepdpwn Cont coumadin  LOS: 5 days    Tharon Aquas Trigt III 09/03/2014

## 2014-09-03 NOTE — Plan of Care (Signed)
Problem: Phase II Progression Outcomes Goal: Ventricular heart rate < 100/min Outcome: Progressing Occasionally still increases with acitivity but more controlled this evening

## 2014-09-03 NOTE — Progress Notes (Signed)
Patient ID: Katelyn [name], female   DOB: 1940/03/14, 74 y.o.   MRN: [id number]    Patient Name: Katelyn [name] Date of Encounter: 09/03/2014     Principal Problem:   S/P TAVR (transcatheter aortic valve replacement) Active Problems:   Peripheral vascular disease   Essential hypertension   Hyperlipidemia with target LDL less than 70   Aortic stenosis, severe   CAD in native artery   Atrial fibrillation with RVR   LBBB (left bundle branch block), chronic   Tachycardia-bradycardia syndrome   Severe aortic valve stenosis    SUBJECTIVE  No chest pain or sob.   CURRENT MEDS . acetaminophen  1,000 mg Oral 4 times per day   Or  . acetaminophen (TYLENOL) oral liquid 160 mg/5 mL  1,000 mg Per Tube 4 times per day  . amiodarone  200 mg Oral Daily  . aspirin EC  81 mg Oral Daily  . chlorhexidine   Topical Once  . estrogens (conjugated)  1.25 mg Oral Daily  . irbesartan  75 mg Oral Daily  . pantoprazole  40 mg Oral Daily  . rosuvastatin  20 mg Oral q1800  . warfarin  2.5 mg Oral q1800  . Warfarin - Physician Dosing Inpatient   Does not apply q1800    OBJECTIVE  Filed Vitals:   09/03/14 0600 09/03/14 0700 09/03/14 0743 09/03/14 0800  BP: 107/60   127/72  Pulse: 124 118  109  Temp:   98 F (36.7 C)   TempSrc:   Oral   Resp: '16 18  16  '$ Height:      Weight:      SpO2: 95% 96%  96%    Intake/Output Summary (Last 24 hours) at 09/03/14 0840 Last data filed at 09/03/14 0500  Gross per 24 hour  Intake  340.1 ml  Output   1570 ml  Net -1229.9 ml   Filed Weights   08/31/14 0500 09/02/14 0600 09/03/14 0545  Weight: 174 lb 12.8 oz (79.289 kg) 179 lb 0.2 oz (81.2 kg) 174 lb (78.926 kg)    PHYSICAL EXAM  General: Pleasant, NAD. Neuro: Alert and oriented X 3. Moves all extremities spontaneously. Psych: Normal affect. HEENT:  Normal  Neck: Supple without bruits or JVD. Lungs:  Resp regular and unlabored, CTA. Heart: Reg tachy no s3, s4, soft systolic murmur. Abdomen:  Soft, non-tender, non-distended, BS + x 4.  Extremities: No clubbing, cyanosis or edema. DP/PT/Radials 2+ and equal bilaterally.  Accessory Clinical Findings  CBC  Recent Labs  09/01/14 0300 09/03/14 0205  WBC 8.8 8.0  HGB 11.5* 12.2  HCT 34.9* 37.3  MCV 92.8 94.0  PLT 69* 56*   Basic Metabolic Panel  Recent Labs  09/01/14 0300 09/02/14 0410 09/03/14 0205  NA 136 139 138  K 4.5 3.4* 3.9  CL 108 103 105  CO2 '22 29 25  '$ GLUCOSE 113* 113* 112*  BUN '11 8 12  '$ CREATININE 0.89 0.99 1.04*  CALCIUM 7.5* 7.9* 8.0*  MG 2.3  --   --    Liver Function Tests No results for input(s): AST, ALT, ALKPHOS, BILITOT, PROT, ALBUMIN in the last 72 hours. No results for input(s): LIPASE, AMYLASE in the last 72 hours. Cardiac Enzymes No results for input(s): CKTOTAL, CKMB, CKMBINDEX, TROPONINI in the last 72 hours. BNP Invalid input(s): POCBNP D-Dimer No results for input(s): DDIMER in the last 72 hours. Hemoglobin A1C No results for input(s): HGBA1C in the last 72 hours. Fasting Lipid Panel No results  for input(s): CHOL, HDL, LDLCALC, TRIG, CHOLHDL, LDLDIRECT in the last 72 hours. Thyroid Function Tests No results for input(s): TSH, T4TOTAL, T3FREE, THYROIDAB in the last 72 hours.  Invalid input(s): FREET3  TELE  nsr to atrial fib/flutter  Radiology/Studies  Dg Chest 2 View  08/29/2014   CLINICAL DATA:  Preoperative chest radiograph. Aortic valve replacement.  EXAM: CHEST  2 VIEW  COMPARISON:  CT 08/08/2014.  FINDINGS: Emphysema. Bilateral pleural apical scarring demonstrated on prior chest CT. Aortic atherosclerosis. No airspace disease. No pleural effusion. 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  IMPRESSION: No acute cardiopulmonary disease. Emphysema and bilateral chronic pleural apical scarring.   Electronically Signed   By: Dereck Ligas M.D.   On: 08/29/2014 12:35   Ct Coronary Morp W/cta Cor W/score W/ca W/cm &/or Wo/cm  08/08/2014   ADDENDUM REPORT:  08/08/2014 19:16  CLINICAL DATA:  75 year old female with severe aortic stenosis.  EXAM: Cardiac TAVR CT  TECHNIQUE: The patient was scanned on a Philips 256 scanner. A 120 kV retrospective scan was triggered in the descending thoracic aorta at 111 HU's. Gantry rotation speed was 270 msecs and collimation was .9 mm. 2.5 of iv Metoprolol and no nitro were given. The 3D data set was reconstructed in 5% intervals of the R-R cycle. Systolic and diastolic phases were analyzed on a dedicated work station using MPR, MIP and VRT modes. The patient received 80 cc of contrast.  FINDINGS: Aortic Valve: Trileaflet, severely thickened, moderately calcified with severely restricted leaflet excursions.  Aorta: Normal caliber, no dissection. There are severe diffuse calcifications in the entire thoracic aorta predominantly in the aortic arch and descending thoracic aorta (no porcelain aorta).  Sinotubular Junction:  30 x 29 mm  Ascending Thoracic Aorta:  32 x 32 mm  Aortic Arch:  27 x 24 mm  Descending Thoracic Aorta:  25 x 23 mm  Sinus of Valsalva Measurements:  Non-coronary:  35 mm  Right -coronary:  35 mm  Left -coronary:  36 mm  Coronary Artery Height above Annulus:  Left Main:  14 mm  Right Coronary:  18 mm  Virtual Basal Annulus Measurements:  Maximum/Minimum Diameter:  28 x 24 mm  Perimeter:  99 mm  Area:  551 mm2  Coronary Arteries: The study suboptimal for coronary evaluation performed without use of NTG.  Optimum Fluoroscopic Angle for Delivery:  LAO 10 CAU 4  IMPRESSION: 1. Severely thickened and moderately calcified aortic valve with restricted leaflet opening and annular measurements suitable for delivery of 29 mm Edward-SAPIEN 3 valve.  2.  Sufficient annulus to coronary distance.  3. Optimum fluoroscopic angle for delivery LAO 10 CAU 4.  4. Severe diffuse calcifications in the entire thoracic aorta predominantly in the aortic arch and descending thoracic aorta.  Ena Dawley   Electronically Signed   By: Ena Dawley   On: 08/08/2014 19:16   08/08/2014   EXAM: OVER-READ INTERPRETATION  CT CHEST  The following report is an over-read performed by radiologist Dr. Rebekah Chesterfield [facility] on 08/08/2014. This over-read does not include interpretation of cardiac or coronary anatomy or pathology. The coronary calcium score/coronary CTA interpretation by the cardiologist is attached.  COMPARISON:  No priors.  FINDINGS: Extracardiac findings will be fully described on separate dictation for a contemporaneously obtained CTA of the chest abdomen and pelvis.  IMPRESSION: See separate dictation for full description of extracardiac findings associated with the report for the CTA of the chest, abdomen and pelvis performed 08/08/2014.  Electronically Signed: By: Vinnie Langton M.D. On: 08/08/2014 15:30   Dg Chest Port 1 View  09/02/2014   CLINICAL DATA:  Status post valve replacement.  Shortness of Breath  EXAM: PORTABLE CHEST - 1 VIEW  COMPARISON:  09/01/2014  FINDINGS: Changes of aortic valve replacement. Interval removal of Swan-Ganz catheter. Pacer entering from the IVC remains in the right ventricle, stable. Heart is upper limits normal in size. Biapical scarring. No acute airspace opacities. No effusions or pneumothorax.  IMPRESSION: No acute findings.  Biapical scarring.  No pneumothorax.   Electronically Signed   By: Rolm Baptise M.D.   On: 09/02/2014 07:42   Dg Chest Port 1 View  09/01/2014   CLINICAL DATA:  One day post transcatheter aortic valve replacement. Shortness of breath.  EXAM: PORTABLE CHEST - 1 VIEW  COMPARISON:  08/31/2014 and earlier.  FINDINGS: Aortic valve prosthesis, unchanged. Right femoral temporary pacing wire at the expected location of the RV apex, unchanged. Right jugular Swan-Ganz catheter tip projects at the expected level of the pulmonary outflow tract or proximal main pulmonary trunk, unchanged.  Cardiac silhouette upper normal in size to slightly enlarged, unchanged. Lungs clear.  No pleural effusions.  IMPRESSION: 1. Support apparatus satisfactory. 2.  No acute cardiopulmonary disease.   Electronically Signed   By: Evangeline Dakin M.D.   On: 09/01/2014 07:33   Dg Chest Port 1 View  08/31/2014   CLINICAL DATA:  Status post transcatheter aortic valve replacement  EXAM: PORTABLE CHEST - 1 VIEW  COMPARISON:  PA and lateral chest x-ray of August 29, 2014  FINDINGS: The lungs are well-expanded. There is no focal infiltrate. There is no pleural effusion or pneumothorax. The cardiopericardial silhouette is mildly enlarged. The it cage from the transcatheter aortic valve placement is in reasonable radiographic position. There is a Swan-Ganz catheter in place via the right internal jugular approach whose tip lies in the region of the proximal pulmonary outflow tract. There is an additional catheter present placed via femoral produce that has its tip overlying the right ventricular apex. The bony thorax exhibits no acute abnormality.  IMPRESSION: 1. There is no postprocedure complication followingtranscatheter aortic valve replacement. The vascular support catheters are in place as described. 2. Underlying COPD.   Electronically Signed   By: David  Martinique M.D.   On: 08/31/2014 15:59   Ct Angio Chest Aorta W/cm &/or Wo/cm  08/08/2014   CLINICAL DATA:  74 year old female with history of severe aortic stenosis. Preprocedural study prior to potential transcatheter aortic valve replacement (TAVR).  EXAM: CT ANGIOGRAPHY CHEST, ABDOMEN AND PELVIS  TECHNIQUE: Multidetector CT imaging through the chest, abdomen and pelvis was performed using the standard protocol during bolus administration of intravenous contrast. Multiplanar reconstructed images and MIPs were obtained and reviewed to evaluate the vascular anatomy.  CONTRAST:  46m OMNIPAQUE IOHEXOL 350 MG/ML SOLN, 733mOMNIPAQUE IOHEXOL 350 MG/ML SOLN  COMPARISON:  CTA of the abdomen and pelvis 10/27/2010.  FINDINGS: CTA CHEST FINDINGS  Mediastinum/Lymph  Nodes: Heart size is borderline enlarged with concentric left ventricular hypertrophy. There is no significant pericardial fluid, thickening or pericardial calcification. Severe thickening calcifications of the aortic valve. There is atherosclerosis of the thoracic aorta, the great vessels of the mediastinum and the coronary arteries, including calcified atherosclerotic plaque in the left main, left anterior descending, left circumflex and right coronary arteries. No pathologically enlarged mediastinal or hilar lymph nodes. Numerous densely calcified right hilar and mediastinal lymph nodes are noted. Esophagus is unremarkable in appearance. No  axillary lymphadenopathy.  Lungs/Pleura: Several calcified pulmonary nodules are noted in the lungs bilaterally, compatible with benign granulomas. 4 mm ground-glass attenuation nodule in the superior segment of the left lower lobe (image 21 of series 407). No other larger more suspicious appearing pulmonary nodules or masses are otherwise noted. Bilateral apical pleuroparenchymal thickening, somewhat nodular in appearance, presumably areas of post infectious or inflammatory scarring. Dependent ground-glass attenuation in the posterior aspect of the right lower lobe may reflect a focus of infection or sequela of recent mild aspiration. Mild diffuse bronchial wall thickening with mild centrilobular and paraseptal emphysema. No pleural effusions.  Musculoskeletal/Soft Tissues: There are no aggressive appearing lytic or blastic lesions noted in the visualized portions of the skeleton.  CTA ABDOMEN AND PELVIS FINDINGS  Hepatobiliary: No suspicious appearing cystic or solid hepatic lesions are noted. No intra or extrahepatic biliary ductal dilatation. Tiny calcified gallstones layer dependently in the gallbladder. No signs of acute cholecystitis at this time.  Pancreas: No pancreatic mass. No pancreatic ductal dilatation. No pancreatic or peripancreatic fluid or inflammatory  changes.  Spleen: Unremarkable.  Adrenals/Urinary Tract: Bilateral adrenal glands and bilateral kidneys are normal in appearance. No hydroureteronephrosis. Urinary bladder is normal in appearance.  Stomach/Bowel: Normal appearance of the stomach. No pathologic dilatation of small bowel or colon. Normal appendix.  Vascular/Lymphatic: Vascular findings and measurements pertinent to potential TAVR procedure, as detailed below. As described, patient is status post aortobifemoral bypass graft, which is widely patent. 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 Retroaortic left renal vein (normal anatomical variant) incidentally noted. No lymphadenopathy noted in the abdomen or pelvis.  Reproductive: Status post hysterectomy. Ovaries are not confidently identified may be surgically absent or atrophic.  Other: No significant volume of ascites. No pneumoperitoneum. Surgical clips in the left inguinal region. Immediately superficial to this there is a well-circumscribed 2.6 x 2.3 cm low-attenuation lesion which likely represents a chronic postoperative fluid collections such as a seroma; this finding is smaller than remote prior study 10/27/2010.  Musculoskeletal: Well-circumscribed lucent lesion with sclerotic margins and narrow zone of transition in the intertrochanteric region of the left femur measures approximately 2.2 cm in diameter and is very similar to remote prior study 10/27/2010, and although indeterminate, is presumably benign. There are no other aggressive appearing lytic or blastic lesions noted in the visualized portions of the skeleton.  VASCULAR MEASUREMENTS PERTINENT TO TAVR:  AORTA:  Minimal Aortic Diameter -  14 x 16 mm  Severity of Aortic Calcification - Moderate to severe in the native abdominal aorta which is completely occluded in the infrarenal region, replaced by the aortobifemoral bypass graft.  COMMENT: The patient is status post  aortobifemoral bypass graft. The bypass graft is widely patent, and the measurements indicated below reflect the portions of the graft which replace the corresponding portions of the native pelvic vessels. The native common iliac arteries are occluded bilaterally, while there is reconstituted flow in the native external iliac arteries and native common femoral arteries bilaterally, these vessels are diminutive in size and severely diseased with atherosclerosis.  RIGHT PELVIS:  Right Common Iliac Artery -  Minimal Diameter - 9.2 x 7.6 mm  Tortuosity - minimal  Calcification - none  Right External Iliac Artery -  Minimal Diameter - 10.3 x 10.3 mm  Tortuosity - minimal  Calcification - none  Right Common Femoral Artery -  Minimal Diameter - 10.1 x 9.4 mm  Tortuosity - minimal  Calcification - none  LEFT  PELVIS:  Left Common Iliac Artery -  Minimal Diameter - 10.1 x 10.1 mm  Tortuosity - mild  Calcification - none  Left External Iliac Artery -  Minimal Diameter - 10.1 x 10.9 mm  Tortuosity - minimal  Calcification - none  Left Common Femoral Artery -  Minimal Diameter - 10.4 x 11.1 mm  Tortuosity - minimal  Calcification - none  Review of the MIP images confirms the above findings.  IMPRESSION: 1. Status post aortobifemoral bypass graft placement. The bypass graft is widely patent. Assuming vascular access is acceptable via the graft rather than the native vessels, this patient does appear to have suitable pelvic arterial access, as above. 2. Severe thickening in calcifications of the aortic valve, compatible with the reported clinical history of aortic stenosis. This is associated with some concentric left ventricular hypertrophy. 3. Dependent ground-glass attenuation airspace disease in the right lower lobe likely reflects a mild infection, or sequela of recent aspiration. Clinical correlation is recommended. 4. Mild diffuse bronchial wall thickening with mild centrilobular and paraseptal emphysema; imaging findings  suggestive of underlying COPD. 5. Small resolving postoperative fluid collection in the left inguinal region is presumably a resolving seroma or old chronic hematoma. 6. Additional incidental findings, as above.   Electronically Signed   By: Vinnie Langton M.D.   On: 08/08/2014 15:52   Ct Angio Abd/pel W/ And/or W/o  08/08/2014   CLINICAL DATA:  74 year old female with history of severe aortic stenosis. Preprocedural study prior to potential transcatheter aortic valve replacement (TAVR).  EXAM: CT ANGIOGRAPHY CHEST, ABDOMEN AND PELVIS  TECHNIQUE: Multidetector CT imaging through the chest, abdomen and pelvis was performed using the standard protocol during bolus administration of intravenous contrast. Multiplanar reconstructed images and MIPs were obtained and reviewed to evaluate the vascular anatomy.  CONTRAST:  56m OMNIPAQUE IOHEXOL 350 MG/ML SOLN, 724mOMNIPAQUE IOHEXOL 350 MG/ML SOLN  COMPARISON:  CTA of the abdomen and pelvis 10/27/2010.  FINDINGS: CTA CHEST FINDINGS  Mediastinum/Lymph Nodes: Heart size is borderline enlarged with concentric left ventricular hypertrophy. There is no significant pericardial fluid, thickening or pericardial calcification. Severe thickening calcifications of the aortic valve. There is atherosclerosis of the thoracic aorta, the great vessels of the mediastinum and the coronary arteries, including calcified atherosclerotic plaque in the left main, left anterior descending, left circumflex and right coronary arteries. No pathologically enlarged mediastinal or hilar lymph nodes. Numerous densely calcified right hilar and mediastinal lymph nodes are noted. Esophagus is unremarkable in appearance. No axillary lymphadenopathy.  Lungs/Pleura: Several calcified pulmonary nodules are noted in the lungs bilaterally, compatible with benign granulomas. 4 mm ground-glass attenuation nodule in the superior segment of the left lower lobe (image 21 of series 407). No other larger more  suspicious appearing pulmonary nodules or masses are otherwise noted. Bilateral apical pleuroparenchymal thickening, somewhat nodular in appearance, presumably areas of post infectious or inflammatory scarring. Dependent ground-glass attenuation in the posterior aspect of the right lower lobe may reflect a focus of infection or sequela of recent mild aspiration. Mild diffuse bronchial wall thickening with mild centrilobular and paraseptal emphysema. No pleural effusions.  Musculoskeletal/Soft Tissues: There are no aggressive appearing lytic or blastic lesions noted in the visualized portions of the skeleton.  CTA ABDOMEN AND PELVIS FINDINGS  Hepatobiliary: No suspicious appearing cystic or solid hepatic lesions are noted. No intra or extrahepatic biliary ductal dilatation. Tiny calcified gallstones layer dependently in the gallbladder. No signs of acute cholecystitis at this time.  Pancreas: No pancreatic mass. No pancreatic  ductal dilatation. No pancreatic or peripancreatic fluid or inflammatory changes.  Spleen: Unremarkable.  Adrenals/Urinary Tract: Bilateral adrenal glands and bilateral kidneys are normal in appearance. No hydroureteronephrosis. Urinary bladder is normal in appearance.  Stomach/Bowel: Normal appearance of the stomach. No pathologic dilatation of small bowel or colon. Normal appendix.  Vascular/Lymphatic: Vascular findings and measurements pertinent to potential TAVR procedure, as detailed below. As described, patient is status post aortobifemoral bypass graft, which is widely patent. 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 Retroaortic left renal vein (normal anatomical variant) incidentally noted. No lymphadenopathy noted in the abdomen or pelvis.  Reproductive: Status post hysterectomy. Ovaries are not confidently identified may be surgically absent or atrophic.  Other: No significant volume of ascites. No pneumoperitoneum.  Surgical clips in the left inguinal region. Immediately superficial to this there is a well-circumscribed 2.6 x 2.3 cm low-attenuation lesion which likely represents a chronic postoperative fluid collections such as a seroma; this finding is smaller than remote prior study 10/27/2010.  Musculoskeletal: Well-circumscribed lucent lesion with sclerotic margins and narrow zone of transition in the intertrochanteric region of the left femur measures approximately 2.2 cm in diameter and is very similar to remote prior study 10/27/2010, and although indeterminate, is presumably benign. There are no other aggressive appearing lytic or blastic lesions noted in the visualized portions of the skeleton.  VASCULAR MEASUREMENTS PERTINENT TO TAVR:  AORTA:  Minimal Aortic Diameter -  14 x 16 mm  Severity of Aortic Calcification - Moderate to severe in the native abdominal aorta which is completely occluded in the infrarenal region, replaced by the aortobifemoral bypass graft.  COMMENT: The patient is status post aortobifemoral bypass graft. The bypass graft is widely patent, and the measurements indicated below reflect the portions of the graft which replace the corresponding portions of the native pelvic vessels. The native common iliac arteries are occluded bilaterally, while there is reconstituted flow in the native external iliac arteries and native common femoral arteries bilaterally, these vessels are diminutive in size and severely diseased with atherosclerosis.  RIGHT PELVIS:  Right Common Iliac Artery -  Minimal Diameter - 9.2 x 7.6 mm  Tortuosity - minimal  Calcification - none  Right External Iliac Artery -  Minimal Diameter - 10.3 x 10.3 mm  Tortuosity - minimal  Calcification - none  Right Common Femoral Artery -  Minimal Diameter - 10.1 x 9.4 mm  Tortuosity - minimal  Calcification - none  LEFT PELVIS:  Left Common Iliac Artery -  Minimal Diameter - 10.1 x 10.1 mm  Tortuosity - mild  Calcification - none  Left  External Iliac Artery -  Minimal Diameter - 10.1 x 10.9 mm  Tortuosity - minimal  Calcification - none  Left Common Femoral Artery -  Minimal Diameter - 10.4 x 11.1 mm  Tortuosity - minimal  Calcification - none  Review of the MIP images confirms the above findings.  IMPRESSION: 1. Status post aortobifemoral bypass graft placement. The bypass graft is widely patent. Assuming vascular access is acceptable via the graft rather than the native vessels, this patient does appear to have suitable pelvic arterial access, as above. 2. Severe thickening in calcifications of the aortic valve, compatible with the reported clinical history of aortic stenosis. This is associated with some concentric left ventricular hypertrophy. 3. Dependent ground-glass attenuation airspace disease in the right lower lobe likely reflects a mild infection, or sequela of recent aspiration. Clinical correlation is recommended. 4. Mild diffuse  bronchial wall thickening with mild centrilobular and paraseptal emphysema; imaging findings suggestive of underlying COPD. 5. Small resolving postoperative fluid collection in the left inguinal region is presumably a resolving seroma or old chronic hematoma. 6. Additional incidental findings, as above.   Electronically Signed   By: Vinnie Langton M.D.   On: 08/08/2014 15:52    ASSESSMENT AND PLAN  1. Aortic stenosis, s/p TAVR 2. Post op atrial fib/flutter - on low dose amiodarone. Will add metoprolol po. Hopefully she will go back to NSR without a long pause. Agree with initiation of anti-coagulation. If she needs PPM, coumadin will not prevent the implant unless INR over 3.  3. LBBB - she has underlying conduction disease which did not appear to get damaged further with TAVR.   Gregg Taylor,M.D.  09/03/2014 8:40 AM

## 2014-09-03 NOTE — Care Management (Signed)
Important Message  Patient Details  Name: Katelyn [name] MRN: [id number] Date of Birth: August 19, 1940   Medicare Important Message Given:  Yes-second notification given    Loann Quill 09/03/2014, 9:29 AM

## 2014-09-03 NOTE — Progress Notes (Signed)
CT surgery p.m. Rounds  Patient maintained sinus rhythm with oral amiodarone and oral metoprolol Otherwise progressing Plan transfer to step down unit tomorrow

## 2014-09-04 ENCOUNTER — Inpatient Hospital Stay (HOSPITAL_COMMUNITY): Payer: Medicare Other

## 2014-09-04 ENCOUNTER — Encounter (HOSPITAL_COMMUNITY): Payer: Self-pay | Admitting: Thoracic Surgery (Cardiothoracic Vascular Surgery)

## 2014-09-04 LAB — PROTIME-INR
INR: 1.14 (ref 0.00–1.49)
PROTHROMBIN TIME: 14.8 s (ref 11.6–15.2)

## 2014-09-04 LAB — COMPREHENSIVE METABOLIC PANEL
ALT: 13 U/L — ABNORMAL LOW (ref 14–54)
AST: 15 U/L (ref 15–41)
Albumin: 2.8 g/dL — ABNORMAL LOW (ref 3.5–5.0)
Alkaline Phosphatase: 54 U/L (ref 38–126)
Anion gap: 8 (ref 5–15)
BUN: 15 mg/dL (ref 6–20)
CO2: 27 mmol/L (ref 22–32)
Calcium: 8.2 mg/dL — ABNORMAL LOW (ref 8.9–10.3)
Chloride: 104 mmol/L (ref 101–111)
Creatinine, Ser: 1.06 mg/dL — ABNORMAL HIGH (ref 0.44–1.00)
GFR calc Af Amer: 60 mL/min — ABNORMAL LOW (ref >60)
GFR calc non Af Amer: 52 mL/min — ABNORMAL LOW (ref >60)
Glucose, Bld: 109 mg/dL — ABNORMAL HIGH (ref 65–99)
Potassium: 4 mmol/L (ref 3.5–5.1)
Sodium: 139 mmol/L (ref 135–145)
Total Bilirubin: 0.4 mg/dL (ref 0.3–1.2)
Total Protein: 5.3 g/dL — ABNORMAL LOW (ref 6.5–8.1)

## 2014-09-04 LAB — CBC
HCT: 37.5 % (ref 36.0–46.0)
Hemoglobin: 12.2 g/dL (ref 12.0–15.0)
MCH: 30.4 pg (ref 26.0–34.0)
MCHC: 32.5 g/dL (ref 30.0–36.0)
MCV: 93.5 fL (ref 78.0–100.0)
Platelets: 59 10*3/uL — ABNORMAL LOW (ref 150–400)
RBC: 4.01 MIL/uL (ref 3.87–5.11)
RDW: 14.1 % (ref 11.5–15.5)
WBC: 7 10*3/uL (ref 4.0–10.5)

## 2014-09-04 MED ORDER — HYDROCORTISONE 1 % EX CREA
TOPICAL_CREAM | Freq: Two times a day (BID) | CUTANEOUS | Status: DC
  Administered 2014-09-04 – 2014-09-05 (×3): via TOPICAL
  Filled 2014-09-04: qty 28

## 2014-09-04 MED ORDER — AMIODARONE HCL 200 MG PO TABS
200.0000 mg | ORAL_TABLET | Freq: Every day | ORAL | Status: DC
  Administered 2014-09-04 – 2014-09-05 (×2): 200 mg via ORAL
  Filled 2014-09-04 (×2): qty 1

## 2014-09-04 MED ORDER — METOPROLOL TARTRATE 12.5 MG HALF TABLET
12.5000 mg | ORAL_TABLET | Freq: Two times a day (BID) | ORAL | Status: DC
  Administered 2014-09-04 – 2014-09-05 (×3): 12.5 mg via ORAL
  Filled 2014-09-04 (×4): qty 1

## 2014-09-04 NOTE — Progress Notes (Addendum)
T[facility] DAILY ICU PROGRESS NOTE                   [address]            [place] [address]          [phone]   4 Days Post-Op Procedure(s) (LRB): TRANSCATHETER AORTIC VALVE REPLACEMENT, TRANSFEMORAL approach (N/A) TRANSESOPHAGEAL ECHOCARDIOGRAM (TEE) (N/A)  Total Length of Stay:  LOS: 6 days   Subjective: Feels well, no complaints.   Objective: Vital signs in last 24 hours: Temp:  [97.6 F (36.4 C)-98.2 F (36.8 C)] 97.6 F (36.4 C) (07/05 0742) Pulse Rate:  [64-134] 67 (07/04 1100) Cardiac Rhythm:  [-] Sinus bradycardia (07/05 0445) Resp:  [11-22] 15 (07/05 0742) BP: (107-152)/(40-72) 116/59 mmHg (07/05 0742) SpO2:  [95 %-97 %] 95 % (07/05 0742) Weight:  [167 lb 8.8 oz (76 kg)] 167 lb 8.8 oz (76 kg) (07/05 0500)  Filed Weights   09/02/14 0600 09/03/14 0545 09/04/14 0500  Weight: 179 lb 0.2 oz (81.2 kg) 174 lb (78.926 kg) 167 lb 8.8 oz (76 kg)    Weight change: -6 lb 7.2 oz (-2.926 kg)   Hemodynamic parameters for last 24 hours:    Intake/Output from previous day: 07/04 0701 - 07/05 0700 In: 1080 [P.O.:1080] Out: 2050 [Urine:2050]  Intake/Output this shift:    Current Meds: Scheduled Meds: . acetaminophen  1,000 mg Oral 4 times per day   Or  . acetaminophen (TYLENOL) oral liquid 160 mg/5 mL  1,000 mg Per Tube 4 times per day  . amiodarone  200 mg Oral BID  . aspirin EC  81 mg Oral Daily  . chlorhexidine   Topical Once  . estrogens (conjugated)  1.25 mg Oral Daily  . irbesartan  75 mg Oral Daily  . metoprolol tartrate  25 mg Oral BID  . pantoprazole  40 mg Oral Daily  . rosuvastatin  20 mg Oral q1800  . sodium chloride  3 mL Intravenous Q12H  . warfarin  3 mg Oral q1800  . Warfarin - Physician Dosing Inpatient   Does not apply q1800   Continuous Infusions:  PRN Meds:.hydrALAZINE, labetalol, metoprolol, morphine injection, ondansetron (ZOFRAN) IV, oxyCODONE, phenol, potassium chloride, sodium chloride, temazepam, traMADol  Physical  Exam: General appearance: alert, cooperative and no distress Heart: Brady in 50s Lungs: clear to auscultation bilaterally Extremities: no edema, redness or tenderness in the calves or thighs Wound: Clean and dry    Lab Results: CBC: Recent Labs  09/03/14 0205 09/04/14 0216  WBC 8.0 7.0  HGB 12.2 12.2  HCT 37.3 37.5  PLT 56* 59*   BMET:  Recent Labs  09/03/14 0205 09/04/14 0216  NA 138 139  K 3.9 4.0  CL 105 104  CO2 25 27  GLUCOSE 112* 109*  BUN 12 15  CREATININE 1.04* 1.06*  CALCIUM 8.0* 8.2*    PT/INR:  Recent Labs  09/04/14 0216  LABPROT 14.8  INR 1.14   Radiology: Dg Chest 2 View  09/04/2014   CLINICAL DATA:  Aortic valve replacement .  EXAM: CHEST  2 VIEW  COMPARISON:  09/03/2014.  FINDINGS: Mediastinum and hilar structures are normal. Prior aortic valve replacement. Heart size stable. No pulmonary venous congestion. Mild left base subsegmental atelectasis. Apical pleural thickening noted consistent with scarring. No pneumothorax. No acute bony abnormality.  IMPRESSION: 1. Aortic valve replacement.  No CHF. 2. Mild left base subsegmental atelectasis .   Electronically Signed   By: Marcello Moores  Register   On: 09/04/2014 07:17     Assessment/Plan: S/P Procedure(s) (LRB): TRANSCATHETER AORTIC VALVE REPLACEMENT, TRANSFEMORAL approach (N/A) TRANSESOPHAGEAL ECHOCARDIOGRAM (TEE) (N/A)  CV- AF, now sinus brady on Amio, Lopressor.  Will decrease Amio dose and watch for recurrent AF.  BPs stable.  Thrombocytopenia- plts remain stable although low.  Continue to follow.  Mobilize, continue IS/pulm toilet, transfer to stepdown.  Possibly ready for d/c home 1-2 days if rhythm remains stable.   COLLINS,GINA H 09/04/2014 7:46 AM   tx to stepdown Decrease lopressor dose Cont home dose coumadin patient examined and medical record reviewed,agree with above note. Tharon Aquas Trigt III 09/04/2014

## 2014-09-04 NOTE — Progress Notes (Signed)
    Subjective:  Feels well. No chest pain or shortness of breath.  Objective:  Vital Signs in the last 24 hours: Temp:  [97.6 F (36.4 C)-98.2 F (36.8 C)] 97.6 F (36.4 C) (07/05 0742) Pulse Rate:  [55-134] 55 (07/05 0800) Resp:  [11-22] 18 (07/05 0800) BP: (107-152)/(40-62) 110/54 mmHg (07/05 0800) SpO2:  [94 %-97 %] 94 % (07/05 0800) Weight:  [167 lb 8.8 oz (76 kg)] 167 lb 8.8 oz (76 kg) (07/05 0500)  Intake/Output from previous day: 07/04 0701 - 07/05 0700 In: 1080 [P.O.:1080] Out: 2050 [Urine:2050]  Physical Exam: Pt is alert and oriented, NAD HEENT: normal Neck: JVP - normal Lungs: CTA bilaterally CV: brady and regular with 2/6 systolic murmur at RUSB Abd: soft, NT, Positive BS, no hepatomegaly Ext: no C/C/E, groin sites clear Skin: warm/dry - rash present on chest and back  Lab Results:  Recent Labs  09/03/14 0205 09/04/14 0216  WBC 8.0 7.0  HGB 12.2 12.2  PLT 56* 59*    Recent Labs  09/03/14 0205 09/04/14 0216  NA 138 139  K 3.9 4.0  CL 105 104  CO2 25 27  GLUCOSE 112* 109*  BUN 12 15  CREATININE 1.04* 1.06*   No results for input(s): TROPONINI in the last 72 hours.  Invalid input(s): CK, MB  Cardiac Studies: POD #1 Echo: Study Conclusions  - Left ventricle: The cavity size was normal. There was moderate concentric hypertrophy. Systolic function was normal. The estimated ejection fraction was in the range of 60% to 65%. Wall motion was normal; there were no regional wall motion abnormalities. Doppler parameters are consistent with abnormal left ventricular relaxation (grade 1 diastolic dysfunction). - Aortic valve: Aortic stent valve with normal function. There was no significant regurgitation. Valve area (VTI): 2.08 cm^2. Valve area (Vmax): 1.84 cm^2. Valve area (Vmean): 1.94 cm^2. - Mitral valve: Calcified annulus. Mildly thickened leaflets .  Tele: Episodes of AF, none since yesterday morning. Now sinus rhythm, sinus  brady  Assessment/Plan:  1. Severe AS s/p TAVR - normal valve function by echo, progressing well. 2. Post-op atrial fib/flutter - now on warfarin/ASA. Amio for rhythm control 3. Bradycardia/LBBB - significant conduction disease at baseline but no major change since TAVR procedure. Decrease metoprolol per Dr Prescott Gum - agree. OK to follow for now as no acute indication for PPM 4. Dispo: anticipate home tomorrow if rhythm stable.   Sherren Mocha, M.D. 09/04/2014, 8:24 AM

## 2014-09-04 NOTE — Progress Notes (Signed)
09/04/2014 09:30 PM The patient's IV site infiltrated and the IV was taken out.  When asked to have the IV replaced, the patient declined saying she did not need another IV.  Will continue to monitor. Lupita Dawn, RN

## 2014-09-04 NOTE — Discharge Summary (Signed)
[address]       [place] [address]             [phone]              Discharge Summary  Name: Katelyn [name] DOB: 01/19/1941 74 y.o. MRN: [id number]   Admission Date: 08/29/2014 Discharge Date:     Admitting Diagnosis: Severe aortic stenosis Multivessel coronary artery disease Atrial fibrillation   Discharge Diagnosis:  Principal Problem:   S/P TAVR (transcatheter aortic valve replacement) Active Problems:   Peripheral vascular disease   Essential hypertension   Hyperlipidemia with target LDL less than 70   Aortic stenosis, severe   CAD in native artery   Atrial fibrillation with RVR   LBBB (left bundle branch block), chronic   Tachycardia-bradycardia syndrome   Severe aortic valve stenosis Postoperative thrombocytopenia   Past Medical History  Diagnosis Date  . Hypertension   . Claudication   . Seroma, postoperative     Left Groin  . Coronary artery disease     a. April 2012 which revealed mid RCA occlusion with collaterals, 50% LAD stenosis, 30% circumflex stenosis, and 40% marginal stenosis  . Peripheral vascular disease   . Glaucoma     early stage  . Aortic stenosis, severe   . GERD (gastroesophageal reflux disease)   . Childhood asthma   . Chronic bronchitis        . Arthritis        . Stroke 1982 X 2    "a little bit weaker on the left side since" (08/29/2014)  . 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    26 mm Edwards Sapien 3 transcatheter heart valve placed via open right transfemoral approach     Procedures: Transcatheter Aortic Valve Replacement - Open Right Transfemoral Approach  Edwards Sapien 3 Transcatheter Heart Valve (size 26 mm) - 08/31/2014    HPI:  The patient is a 74 y.o. female with severe symptomatic aortic stenosis, coronary artery disease, hypertension, previous stroke on chronic warfarin therapy, and peripheral arterial disease. The patient has known history of heart  murmur for which she has been followed for several years by Dr. Claiborne Billings. Transthoracic echocardiograms have demonstrated aortic stenosis which has gradually progressed over several years of follow up. Echocardiogram performed 02/12/2014 revealed severe aortic stenosis with peak velocity across the aortic valve measured greater than 4 m/s corresponding to a mean transvalvular gradient of 42 mmHg. Left ventricular systolic function remained preserved with ejection fraction estimated 55-60%. The patient was seen in follow-up by Dr. Claiborne Billings on 04/20/2014. At that time, the patient claimed to have remained asymptomatic. Close follow-up was planned. Over the next few months, the patient developed progressive symptoms of exertional chest tightness and shortness of breath. She was seen in follow-up in early May and subsequently scheduled for diagnostic cardiac catheterization. Cardiac catheterization performed 07/25/2014 revealed multivessel coronary artery disease with 100% chronic occlusion of the mid right coronary artery, 70% proximal stenosis of a large diagonal branch, and 50% stenosis of the left circumflex coronary artery. Peak to peak and mean transvalvular gradients measured across the aortic valve at catheterization measured 46 and 39.5 mmHg, respectively. Aortic valve area was calculated at 0.64 cm. Left ventricular function remained preserved with ejection fraction estimated 55%. The patient was referred for surgical consultation and was evaluated by Dr. Cyndia Bent in early June. He noted that the patient had severe calcification involving the entire proximal ascending thoracic aorta and felt  that the patient would be at very high risk for conventional surgical aortic valve replacement with or without coronary artery bypass grafting. Transcatheter aortic valve replacement was discussed as an alternative therapy and the patient has subsequently undergone CT angiography and been evaluated by a multidisciplinary  team of specialists. The patient underwent numerous routine preoperative diagnostic tests as an outpatient. However, the patient developed rapid atrial fibrillation associated with a near syncopal event during her anesthesia consultation and was subsequently admitted to the hospital for treatment.   Hospital Course:  The patient was admitted to [facility] on 08/29/2014. She was started on IV Cardizem and she rate was controlled, but she developed significant pauses.  Electrophysiology was consulted and it was felt that she would likely require permanent pacemaker in the future due to tachy/brady syndrome.  She was evaluated by Dr. Roxy Manns again for possible TAVR.  Alternative approaches such as conventional aortic valve replacement, transcatheter aortic valve replacement, and palliative medical therapy were compared and contrasted at length, and the patient elected to proceed with TAVR. All risks, benefits and alternatives of surgery were explained in detail, and the patient agreed to proceed. The patient was taken to the operating room and underwent the above procedure.    The postoperative course was notable for atrial fibrillation/flutter.  The patient was treated with IV Amiodarone and po Metoprolol and converted to sinus rhythm. She did have some bradycardia which was asymptomatic, and her doses of Amiodarone and Lopressor were decreased. It is not felt that she will require a permanent pacemaker at this time. Coumadin was started and INR is trending up appropriately.  Groin wound is stable with no hematoma. She has had a postoperative thrombocytopenia which has remained stable and is slowly improving. The patient is ambulating in the halls without difficulty and is tolerating a regular diet. It is felt that if no further rhythm issues occur, the patient will be ready for discharge home in the next 24-48 hours.     Recent vital signs:  Filed Vitals:   09/04/14 1152  BP: 150/66  Pulse: 67  Temp: 97.4  F (36.3 C)  Resp: 18    Recent laboratory studies:  CBC: Recent Labs  09/03/14 0205 09/04/14 0216  WBC 8.0 7.0  HGB 12.2 12.2  HCT 37.3 37.5  PLT 56* 59*   BMET:  Recent Labs  09/03/14 0205 09/04/14 0216  NA 138 139  K 3.9 4.0  CL 105 104  CO2 25 27  GLUCOSE 112* 109*  BUN 12 15  CREATININE 1.04* 1.06*  CALCIUM 8.0* 8.2*    PT/INR:  Recent Labs  09/04/14 0216  LABPROT 14.8  INR 1.14      Medication List    STOP taking these medications        metoprolol succinate 25 MG 24 hr tablet  Commonly known as:  TOPROL-XL      TAKE these medications        amiodarone 200 MG tablet  Commonly known as:  PACERONE  Take 1 tablet (200 mg total) by mouth daily.     aspirin 81 MG EC tablet  Take 1 tablet (81 mg total) by mouth daily.     cetirizine 10 MG tablet  Commonly known as:  ZYRTEC  Take 10 mg by mouth daily.     estrogens (conjugated) 1.25 MG tablet  Commonly known as:  PREMARIN  Take 1.25 mg by mouth daily.     folic acid-pyridoxine-cyancobalamin 2.5-25-2 MG Tabs  Commonly  known as:  FOLBIC  Take 1 tablet by mouth daily.     furosemide 20 MG tablet  Commonly known as:  LASIX  Take 20 mg by mouth as needed (fluid retention).     meloxicam 15 MG tablet  Commonly known as:  MOBIC  Take 15 mg by mouth daily.     metoprolol tartrate 25 MG tablet  Commonly known as:  LOPRESSOR  Take 0.5 tablets (12.5 mg total) by mouth 2 (two) times daily.     oxyCODONE 5 MG immediate release tablet  Commonly known as:  Oxy IR/ROXICODONE  Take 1-2 tablets (5-10 mg total) by mouth every 6 (six) hours as needed for severe pain.     potassium chloride 10 MEQ CR tablet  Commonly known as:  KLOR-CON  Take 10 mEq by mouth as needed (when taking Lasix).     rosuvastatin 20 MG tablet  Commonly known as:  CRESTOR  Take 20 mg by mouth daily.     valsartan 80 MG tablet  Commonly known as:  DIOVAN  Take 1 tablet (80 mg total) by mouth daily.     warfarin 3 MG  tablet  Commonly known as:  COUMADIN  Take 3 mg by mouth daily at 6 PM.         Discharge Instructions:  The patient is to refrain from driving, heavy lifting or strenuous activity.  May shower daily and clean incisions with soap and water.  May resume regular diet.  Follow-up Information    Follow up with Thompson Grayer, MD On 10/15/2014.   Specialty:  Cardiology   Why:  at 4:15PM   Contact information:   [address] [place] [address] [phone]       Follow up with Lauree Chandler, MD.   Specialty:  Cardiology   Why:  Office will contact you with an appointment   Contact information:   [place] [place] [address] [phone]       Follow up with Rexene Alberts, MD.   Specialty:  Cardiothoracic Surgery   Why:  office will contact you   Contact information:   [address] [place] [address] [phone]       Follow up with Lanette Hampshire, MD.   Specialty:  Family Medicine   Why:  PT/INR blood test on friday to adjust coumadin dose   Contact information:   [place] [address] [phone]            COLLINS,GINA H 09/04/2014, 1:33 PM

## 2014-09-04 NOTE — Progress Notes (Signed)
Report called to [facility] on [facility]. Delsa Walder S. 11:31 AM   Pt transported to 2w20 in wheelchair, family at side and gathered all patient belongs, pt stable, SR, no complaints.  1145.

## 2014-09-05 LAB — PROTIME-INR
INR: 1.13 (ref 0.00–1.49)
Prothrombin Time: 14.6 seconds (ref 11.6–15.2)

## 2014-09-05 MED ORDER — METOPROLOL TARTRATE 25 MG PO TABS: 12.5000 mg | ORAL_TABLET | Freq: Two times a day (BID) | ORAL | Status: DC

## 2014-09-05 MED ORDER — AMIODARONE HCL 200 MG PO TABS: 200.0000 mg | ORAL_TABLET | Freq: Every day | ORAL | Status: DC

## 2014-09-05 MED ORDER — OXYCODONE HCL 5 MG PO TABS: 5.0000 mg | ORAL_TABLET | Freq: Four times a day (QID) | ORAL | Status: DC | PRN

## 2014-09-05 MED ORDER — ASPIRIN 81 MG PO TBEC
81.0000 mg | DELAYED_RELEASE_TABLET | Freq: Every day | ORAL | Status: DC
Start: 2014-09-05 — End: 2014-10-01

## 2014-09-05 MED FILL — Heparin Sodium (Porcine) Inj 1000 Unit/ML: INTRAMUSCULAR | Qty: 30 | Status: AC

## 2014-09-05 MED FILL — Magnesium Sulfate Inj 50%: INTRAMUSCULAR | Qty: 10 | Status: AC

## 2014-09-05 MED FILL — Insulin Regular (Human) Inj 100 Unit/ML: INTRAMUSCULAR | Qty: 2.5 | Status: AC

## 2014-09-05 MED FILL — Potassium Chloride Inj 2 mEq/ML: INTRAVENOUS | Qty: 40 | Status: AC

## 2014-09-05 NOTE — Discharge Instructions (Signed)
Atrial Fibrillation °Atrial fibrillation is a type of irregular heart rhythm (arrhythmia). During atrial fibrillation, the upper chambers of the heart (atria) quiver continuously in a chaotic pattern. This causes an irregular and often rapid heart rate.  °Atrial fibrillation is the result of the heart becoming overloaded with disorganized signals that tell it to beat. These signals are normally released one at a time by a part of the right atrium called the sinoatrial node. They then travel from the atria to the lower chambers of the heart (ventricles), causing the atria and ventricles to contract and pump blood as they pass. In atrial fibrillation, parts of the atria outside of the sinoatrial node also release these signals. This results in two problems. First, the atria receive so many signals that they do not have time to fully contract. Second, the ventricles, which can only receive one signal at a time, beat irregularly and out of rhythm with the atria.  °There are three types of atrial fibrillation:  °· Paroxysmal. Paroxysmal atrial fibrillation starts suddenly and stops on its own within a week. °· Persistent. Persistent atrial fibrillation lasts for more than a week. It may stop on its own or with treatment. °· Permanent. Permanent atrial fibrillation does not go away. Episodes of atrial fibrillation may lead to permanent atrial fibrillation. °Atrial fibrillation can prevent your heart from pumping blood normally. It increases your risk of stroke and can lead to heart failure.  °CAUSES  °· Heart conditions, including a heart attack, heart failure, coronary artery disease, and heart valve conditions.   °· Inflammation of the sac that surrounds the heart (pericarditis). °· Blockage of an artery in the lungs (pulmonary embolism). °· Pneumonia or other infections. °· Chronic lung disease. °· Thyroid problems, especially if the thyroid is overactive (hyperthyroidism). °· Caffeine, excessive alcohol use, and use  of some illegal drugs.   °· Use of some medicines, including certain decongestants and diet pills. °· Heart surgery.   °· Birth defects.   °Sometimes, no cause can be found. When this happens, the atrial fibrillation is called lone atrial fibrillation. The risk of complications from atrial fibrillation increases if you have lone atrial fibrillation and you are age 60 years or older. °RISK FACTORS °· Heart failure. °· Coronary artery disease. °· Diabetes mellitus.   °· High blood pressure (hypertension).   °· Obesity.   °· Other arrhythmias.   °· Increased age. °SIGNS AND SYMPTOMS  °· A feeling that your heart is beating rapidly or irregularly.   °· A feeling of discomfort or pain in your chest.   °· Shortness of breath.   °· Sudden light-headedness or weakness.   °· Getting tired easily when exercising.   °· Urinating more often than normal (mainly when atrial fibrillation first begins).   °In paroxysmal atrial fibrillation, symptoms may start and suddenly stop. °DIAGNOSIS  °Your health care provider may be able to detect atrial fibrillation when taking your pulse. Your health care provider may have you take a test called an ambulatory electrocardiogram (ECG). An ECG records your heartbeat patterns over a 24-hour period. You may also have other tests, such as: °· Transthoracic echocardiogram (TTE). During echocardiography, sound waves are used to evaluate how blood flows through your heart. °· Transesophageal echocardiogram (TEE). °· Stress test. There is more than one type of stress test. If a stress test is needed, ask your health care provider about which type is best for you. °· Chest X-ray exam. °· Blood tests. °· Computed tomography (CT). °TREATMENT  °Treatment may include: °· Treating any underlying conditions. For example, if you   have an overactive thyroid, treating the condition may correct atrial fibrillation.  Taking medicine. Medicines may be given to control a rapid heart rate or to prevent blood  clots, heart failure, or a stroke.  Having a procedure to correct the rhythm of the heart:  Electrical cardioversion. During electrical cardioversion, a controlled, low-energy shock is delivered to the heart through your skin. If you have chest pain, very low blood pressure, or sudden heart failure, this procedure may need to be done as an emergency.  Catheter ablation. During this procedure, heart tissues that send the signals that cause atrial fibrillation are destroyed.  Surgical ablation. During this surgery, thin lines of heart tissue that carry the abnormal signals are destroyed. This procedure can either be an open-heart surgery or a minimally invasive surgery. With the minimally invasive surgery, small cuts are made to access the heart instead of a large opening.  Pulmonary venous isolation. During this surgery, tissue around the veins that carry blood from the lungs (pulmonary veins) is destroyed. This tissue is thought to carry the abnormal signals. HOME CARE INSTRUCTIONS   Take medicines only as directed by your health care provider. Some medicines can make atrial fibrillation worse or recur.  If blood thinners were prescribed by your health care provider, take them exactly as directed. Too much blood-thinning medicine can cause bleeding. If you take too little, you will not have the needed protection against stroke and other problems.  Perform blood tests at home if directed by your health care provider. Perform blood tests exactly as directed.  Quit smoking if you smoke.  Do not drink alcohol.  Do not drink caffeinated beverages such as coffee, soda, and some teas. You may drink decaffeinated coffee, soda, or tea.   Maintain a healthy weight.Do not use diet pills unless your health care provider approves. They may make heart problems worse.   Follow diet instructions as directed by your health care provider.  Exercise regularly as directed by your health care  provider.  Keep all follow-up visits as directed by your health care provider. This is important. PREVENTION  The following substances can cause atrial fibrillation to recur:   Caffeinated beverages.  Alcohol.  Certain medicines, especially those used for breathing problems.  Certain herbs and herbal medicines, such as those containing ephedra or ginseng.  Illegal drugs, such as cocaine and amphetamines. Sometimes medicines are given to prevent atrial fibrillation from recurring. Proper treatment of any underlying condition is also important in helping prevent recurrence.  SEEK MEDICAL CARE IF:  You notice a change in the rate, rhythm, or strength of your heartbeat.  You suddenly begin urinating more frequently.  You tire more easily when exerting yourself or exercising. SEEK IMMEDIATE MEDICAL CARE IF:   You have chest pain, abdominal pain, sweating, or weakness.  You feel nauseous.  You have shortness of breath.  You suddenly have swollen feet and ankles.  You feel dizzy.  Your face or limbs feel numb or weak.  You have a change in your vision or speech. MAKE SURE YOU:   Understand these instructions.  Will watch your condition.  Will get help right away if you are not doing well or get worse. Document Released: 02/16/2005 Document Revised: 07/03/2013 Document Reviewed: 03/29/2012 [facility] Patient Information 2015 [place], [place]. This information is not intended to replace advice given to you by your health care provider. Make sure you discuss any questions you have with your health care provider.    After Your Transcatheter  Aortic Valve Replacement (TAVR) You have just had surgery to replace your aortic valve with a new biological tissue valve. When you go home, follow all instructions for medicines, pain control, diet, activity, and wound care. Make sure to keep all your follow-up appointments. You should feel better after your surgery. Complete recovery may  take several weeks. How long depends on whether the surgery was done through your groin, underneath the collarbone, or between your ribs. All of these methods are considered less invasive than open heart surgery. This means fewer complications and a shorter recovery time. Below are some general guidelines to follow as you heal.  A pill organizer can help you keep track of the medicines you take each day. Recovering at home Follow your healthcare providers directions for recovery at home. It may take several weeks to get back to your normal routine. Using the groin artery is the least invasive method and heals the fastest. TAVR done between your ribs requires a larger incision takes longer to recover from.  During your recovery:  Take medicine as directed. Take pain medicine, blood thinners, and any other medicine exactly as your healthcare provider advises. You will likely need to take aspirin and another blood thinner (antiplatelets) for a period. Youll need to take the aspirin for the rest of your life. Be sure your doctor knows about all other medicines you take, including over-the-counter medicines and dietary supplements.  Care for your incision. Its normal for your incision to be bruised, itchy, or sore while its healing. Your incision may take a week or more to heal. A surgery site between your ribs will take longer to heal than one in your groin. Care for the bandage and incision as advised. Wash it every day with warm water and soap. Gently pat it dry. Dont put powder, lotion, or ointment on the incision until its healed.  Shower carefully. Unless youre told otherwise, you can shower once you get home. Use warm water and a mild soap. Avoid hot water because it can make you feel lightheaded. Dont take a bath until your healthcare provider says its OK. Also dont sit in a swimming pool or hot tub until your doctor says its OK. If your surgery was between your ribs, you may need to keep  the incision site dry for a certain period.  Avoid activities as directed. Your doctor may tell you to avoid strenuous activities for a week or more. This includes no heavy lifting. Instructions on what to do are different depending on how your surgery was done. Your doctor will give you specific instructions that are appropriate for you.  Dont drive if you are taking opioid pain medicine. These medicines can make you feel sleepy and it is unsafe to drive or operate heavy machinery while taking them.  Walk regularly. One of the best ways to get stronger is to walk. If your doctor agrees, start with short walks at home. Walk a little more each day. Take someone with you until you feel OK to walk alone. Your healthcare provider may recommend a cardiac rehab program. This will allow you to be closely monitored  by trained personnel during exercise. Most insurance companies will cover these programs.  Call 911 Call 911 for immediate care if you have:  Chest pain  Severe difficulty breathing  Signs of stroke such as sudden numbness or weakness in the face, arms, or legs  When should I call my healthcare provider? Seek immediate medical help if you have  any of the following symptoms:  Bowel movement that is bright red  Bleeding that is frequent or persistent, such as nosebleeds  Chills or fever of 101F  or higher  Dizziness, lightheadedness, or fainting  Redness, swelling, bleeding, warmth, or fluid draining at the incision site  Weight gain of more than 2 to 3 pounds in 24 hours or more than 5 pounds in 1 week  Swelling in your hands, feet, or ankles  Shortness of breath that doesnt get better when you rest  Pain that gets worse or doesnt go away  Fast, slow, or irregular pulse  Worsening or severe fatigue  Other signs or symptoms as indicated by your healthcare provider  Follow-up care Follow-up visits with your healthcare provider help make sure youre recovering well.  In fact, to keep feeling your best, youll need regular checkups for the rest of your life. During these visits, you may have:  Blood tests to monitor the function of your heart and kidneys, and check for anemia  Echocardiograms to check how well your heart and new heart valve are working  Electrocardiograms (ECGs) to show if your heart rhythm has changed after your valve replacement  Staying healthy after a heart valve replacement Learn to take your own blood pressure and pulse. Keep a record of your results. Ask your healthcare provider which readings mean that you need medical care.  Weigh yourself everyday and keep a record of this. It is normal for your weight to fluctuate 2 to 3 pounds in a day, anything more than this could mean you are retaining fluid and developing heart failure  Tell all your healthcare providers and dentists youve had a valve replacement. Before any dental procedure, you will need to take an antibiotic to protect your new heart valve.  Take any prescribed blood thinners exactly as directed.  Make lifestyle changes as advised by your healthcare provider. Keep in mind that healthy habits such as exercise, and a healthy diet can strengthen your heart.  Alert your healthcare provider to any new symptoms.    Information on my medicine - Coumadin   (Warfarin)  This medication education was reviewed with me or my healthcare representative as part of my discharge preparation.  The pharmacist that spoke with me during my hospital stay was:  Reginia Naas, [facility]  Why was Coumadin prescribed for you? Coumadin was prescribed for you because you have a blood clot or a medical condition that can cause an increased risk of forming blood clots. Blood clots can cause serious health problems by blocking the flow of blood to the heart, lung, or brain. Coumadin can prevent harmful blood clots from forming. As a reminder your indication for Coumadin is:   Stroke Prevention  Because Of Atrial Fibrillation  What test will check on my response to Coumadin? While on Coumadin (warfarin) you will need to have an INR test regularly to ensure that your dose is keeping you in the desired range. The INR (international normalized ratio) number is calculated from the result of the laboratory test called prothrombin time (PT).  If an INR APPOINTMENT HAS NOT ALREADY BEEN MADE FOR YOU please schedule an appointment to have this lab work done by your health care provider within 7 days. Your INR goal is usually a number between:  2 to 3 or your provider may give you a more narrow range like 2-2.5.  Ask your health care provider during an office visit what your goal INR is.  What  do you need to  know  About  COUMADIN? Take Coumadin (warfarin) exactly as prescribed by your healthcare provider about the same time each day.  DO NOT stop taking without talking to the doctor who prescribed the medication.  Stopping without other blood clot prevention medication to take the place of Coumadin may increase your risk of developing a new clot or stroke.  Get refills before you run out.  What do you do if you miss a dose? If you miss a dose, take it as soon as you remember on the same day then continue your regularly scheduled regimen the next day.  Do not take two doses of Coumadin at the same time.  Important Safety Information A possible side effect of Coumadin (Warfarin) is an increased risk of bleeding. You should call your healthcare provider right away if you experience any of the following: ? Bleeding from an injury or your nose that does not stop. ? Unusual colored urine (red or dark brown) or unusual colored stools (red or black). ? Unusual bruising for unknown reasons. ? A serious fall or if you hit your head (even if there is no bleeding).  Some foods or medicines interact with Coumadin (warfarin) and might alter your response to warfarin. To help avoid this: ? Eat a balanced  diet, maintaining a consistent amount of Vitamin K. ? Notify your provider about major diet changes you plan to make. ? Avoid alcohol or limit your intake to 1 drink for women and 2 drinks for men per day. (1 drink is 5 oz. wine, 12 oz. beer, or 1.5 oz. liquor.)  Make sure that ANY health care provider who prescribes medication for you knows that you are taking Coumadin (warfarin).  Also make sure the healthcare provider who is monitoring your Coumadin knows when you have started a new medication including herbals and non-prescription products.  Coumadin (Warfarin)  Major Drug Interactions  Increased Warfarin Effect Decreased Warfarin Effect  Alcohol (large quantities) Antibiotics (esp. Septra/Bactrim, Flagyl, Cipro) Amiodarone (Cordarone) Aspirin (ASA) Cimetidine (Tagamet) Megestrol (Megace) NSAIDs (ibuprofen, naproxen, etc.) Piroxicam (Feldene) Propafenone (Rythmol SR) Propranolol (Inderal) Isoniazid (INH) Posaconazole (Noxafil) Barbiturates (Phenobarbital) Carbamazepine (Tegretol) Chlordiazepoxide (Librium) Cholestyramine (Questran) Griseofulvin Oral Contraceptives Rifampin Sucralfate (Carafate) Vitamin K   Coumadin (Warfarin) Major Herbal Interactions  Increased Warfarin Effect Decreased Warfarin Effect  Garlic Ginseng Ginkgo biloba Coenzyme Q10 Green tea St. Johns wort    Coumadin (Warfarin) FOOD Interactions  Eat a consistent number of servings per week of foods HIGH in Vitamin K (1 serving =  cup)  Collards (cooked, or boiled & drained) Kale (cooked, or boiled & drained) Mustard greens (cooked, or boiled & drained) Parsley *serving size only =  cup Spinach (cooked, or boiled & drained) Swiss chard (cooked, or boiled & drained) Turnip greens (cooked, or boiled & drained)  Eat a consistent number of servings per week of foods MEDIUM-HIGH in Vitamin K (1 serving = 1 cup)  Asparagus (cooked, or boiled & drained) Broccoli (cooked, boiled & drained, or raw &  chopped) Brussel sprouts (cooked, or boiled & drained) *serving size only =  cup Lettuce, raw (green leaf, endive, romaine) Spinach, raw Turnip greens, raw & chopped   These websites have more information on Coumadin (warfarin):  FailFactory.se; VeganReport.com.au;

## 2014-09-05 NOTE — Progress Notes (Signed)
[address]       [place], [address]             [phone]      5 Days Post-Op Procedure(s) (LRB): TRANSCATHETER AORTIC VALVE REPLACEMENT, TRANSFEMORAL approach (N/A) TRANSESOPHAGEAL ECHOCARDIOGRAM (TEE) (N/A) Subjective: Feels well, no new issues. In sinus rhythm  Objective: Vital signs in last 24 hours: Temp:  [97.4 F (36.3 C)-98.3 F (36.8 C)] 98 F (36.7 C) (07/06 0534) Pulse Rate:  [55-76] 76 (07/06 0534) Cardiac Rhythm:  [-] Normal sinus rhythm;Heart block (07/05 1955) Resp:  [15-18] 18 (07/06 0534) BP: (110-150)/(54-98) 145/57 mmHg (07/06 0534) SpO2:  [94 %-97 %] 96 % (07/06 0534) Weight:  [176 lb 4.8 oz (79.969 kg)] 176 lb 4.8 oz (79.969 kg) (07/06 0534)  Hemodynamic parameters for last 24 hours:    Intake/Output from previous day: 07/05 0701 - 07/06 0700 In: 222 [P.O.:222] Out: 200 [Urine:200] Intake/Output this shift:    General appearance: alert, cooperative and no distress Heart: regular rate and rhythm and 2/6 syst aortic murmur Lungs: clear to auscultation bilaterally Abdomen: benign exam Extremities: no edema Wound: incis healing well  Lab Results:  Recent Labs  09/03/14 0205 09/04/14 0216  WBC 8.0 7.0  HGB 12.2 12.2  HCT 37.3 37.5  PLT 56* 59*   BMET:  Recent Labs  09/03/14 0205 09/04/14 0216  NA 138 139  K 3.9 4.0  CL 105 104  CO2 25 27  GLUCOSE 112* 109*  BUN 12 15  CREATININE 1.04* 1.06*  CALCIUM 8.0* 8.2*    PT/INR:  Recent Labs  09/05/14 0300  LABPROT 14.6  INR 1.13   ABG    Component Value Date/Time   PHART 7.338* 08/31/2014 1609   HCO3 23.8 08/31/2014 1609   TCO2 25 08/31/2014 1609   ACIDBASEDEF 2.0 08/31/2014 1609   O2SAT 91.0 08/31/2014 1609   CBG (last 3)   Recent Labs  09/02/14 1245  GLUCAP 94    Meds Scheduled Meds: . acetaminophen  1,000 mg Oral 4 times per day   Or  . acetaminophen (TYLENOL) oral liquid 160 mg/5 mL  1,000 mg Per Tube 4 times per day  . amiodarone   200 mg Oral Daily  . aspirin EC  81 mg Oral Daily  . chlorhexidine   Topical Once  . estrogens (conjugated)  1.25 mg Oral Daily  . hydrocortisone cream   Topical BID  . irbesartan  75 mg Oral Daily  . metoprolol tartrate  12.5 mg Oral BID  . pantoprazole  40 mg Oral Daily  . rosuvastatin  20 mg Oral q1800  . sodium chloride  3 mL Intravenous Q12H  . warfarin  3 mg Oral q1800  . Warfarin - Physician Dosing Inpatient   Does not apply q1800   Continuous Infusions:  PRN Meds:.hydrALAZINE, labetalol, ondansetron (ZOFRAN) IV, oxyCODONE, phenol, potassium chloride, sodium chloride, temazepam, traMADol  Xrays Dg Chest 2 View  09/04/2014   CLINICAL DATA:  Aortic valve replacement .  EXAM: CHEST  2 VIEW  COMPARISON:  09/03/2014.  FINDINGS: Mediastinum and hilar structures are normal. Prior aortic valve replacement. Heart size stable. No pulmonary venous congestion. Mild left base subsegmental atelectasis. Apical pleural thickening noted consistent with scarring. No pneumothorax. No acute bony abnormality.  IMPRESSION: 1. Aortic valve replacement.  No CHF. 2. Mild left base subsegmental atelectasis .   Electronically Signed   By: Marcello Moores  Register   On: 09/04/2014 07:17    Assessment/Plan:  S/P Procedure(s) (LRB): TRANSCATHETER AORTIC VALVE REPLACEMENT, TRANSFEMORAL approach (N/A) TRANSESOPHAGEAL ECHOCARDIOGRAM (TEE) (N/A)  1 conts to make excellent progress 2 d/w cardiology - ready for discharge home   LOS: 7 days    Cannon Quinton E 09/05/2014

## 2014-09-05 NOTE — Care Management Note (Signed)
Case Management Note  Patient Details  Name: Katelyn [name] MRN: [id number] Date of Birth: 08-30-1940  Subjective/Objective:   Pt had TAVR                 Action/Plan:  Pt is from home with support of husband.  CM will assess pt and monitor for disposition needs   Expected Discharge Date:                  Expected Discharge Plan:  [place]  In-House Referral:     Discharge planning Services  CM Consult  Post Acute Care Choice:    Choice offered to:     DME Arranged:    DME Agency:     HH Arranged:    [place] Agency:     Status of Service:  Completed, signed off  Medicare Important Message Given:  Yes-second notification given Date Medicare IM Given:    Medicare IM give by:    Date Additional Medicare IM Given:    Additional Medicare Important Message give by:     If discussed at [place] of Stay Meetings, dates discussed:    Additional Comments: CM assessed pt , pt is independent and will discharge home with self/care.  No CM needs Maryclare Labrador, RN 09/05/2014, 9:52 AM

## 2014-09-05 NOTE — Care Management (Signed)
Important Message  Patient Details  Name: Katelyn [name] MRN: [id number] Date of Birth: 1940/08/09   Medicare Important Message Given:  Yes-third notification given    Maryclare Labrador, RN 09/05/2014, 9:55 AM

## 2014-09-05 NOTE — Progress Notes (Signed)
     SUBJECTIVE: No complaints. No chest pain or SOB.   BP 145/57 mmHg  Pulse 76  Temp(Src) 98 F (36.7 C) (Oral)  Resp 18  Ht '5\' 10"'$  (1.778 m)  Wt 176 lb 4.8 oz (79.969 kg)  BMI 25.30 kg/m2  SpO2 96%  Intake/Output Summary (Last 24 hours) at 09/05/14 0710 Last data filed at 09/04/14 2300  Gross per 24 hour  Intake    222 ml  Output    200 ml  Net     22 ml    PHYSICAL EXAM General: Well developed, well nourished, in no acute distress. Alert and oriented x 3.  Psych:  Good affect, responds appropriately Neck: No JVD. No masses noted.  Lungs: Clear bilaterally with no wheezes or rhonci noted.  Heart: RRR with ectopy, slight systolic murmur.  Abdomen: Bowel sounds are present. Soft, non-tender.  Extremities: No lower extremity edema.   LABS: Basic Metabolic Panel:  Recent Labs  09/03/14 0205 09/04/14 0216  NA 138 139  K 3.9 4.0  CL 105 104  CO2 25 27  GLUCOSE 112* 109*  BUN 12 15  CREATININE 1.04* 1.06*  CALCIUM 8.0* 8.2*   CBC:  Recent Labs  09/03/14 0205 09/04/14 0216  WBC 8.0 7.0  HGB 12.2 12.2  HCT 37.3 37.5  MCV 94.0 93.5  PLT 56* 59*   Current Meds: . acetaminophen  1,000 mg Oral 4 times per day   Or  . acetaminophen (TYLENOL) oral liquid 160 mg/5 mL  1,000 mg Per Tube 4 times per day  . amiodarone  200 mg Oral Daily  . aspirin EC  81 mg Oral Daily  . chlorhexidine   Topical Once  . estrogens (conjugated)  1.25 mg Oral Daily  . hydrocortisone cream   Topical BID  . irbesartan  75 mg Oral Daily  . metoprolol tartrate  12.5 mg Oral BID  . pantoprazole  40 mg Oral Daily  . rosuvastatin  20 mg Oral q1800  . sodium chloride  3 mL Intravenous Q12H  . warfarin  3 mg Oral q1800  . Warfarin - Physician Dosing Inpatient   Does not apply q1800    ASSESSMENT AND PLAN:  1. Severe AS: POD #5 s/p TAVR. Normal valve function by echo, progressing well. Will continue ASA and coumadin.   2. Atrial fib/flutter, paroxysmal: Now on coumadin/ASA. Amio  for rhythm control. She had been on coumadin as an outpatient. This is followed by her primary care doctor.   3. Bradycardia/LBBB: significant conduction disease at baseline but no major change since TAVR procedure. Continue low dose metoprolol.  No acute indication for PPM  4. Dispo: discharge home today. She will need 1-2 week f/u with Dr. Cyndia Bent or Dr. Roxy Manns and 30 day f/u with me. I will contact my office to arrange f/u with me. She will need to see primary care in several days for INR check. Coumadin dosing will likely be different now that she is on amiodarone.   MCALHANY,CHRISTOPHER  7/6/20167:10 AM

## 2014-09-06 LAB — HIT PANEL (HEPARIN AB + SRA)
Heparin Induced Plt Ab: 0.216 OD (ref 0.000–0.400)
SRA .2 IU/mL UFH Ser-aCnc: 1 % (ref 0–20)
SRA 100IU/mL UFH Ser-aCnc: 1 % (ref 0–20)

## 2014-09-07 ENCOUNTER — Telehealth: Payer: Self-pay | Admitting: *Deleted

## 2014-09-07 DIAGNOSIS — I350 Nonrheumatic aortic (valve) stenosis: Secondary | ICD-10-CM

## 2014-09-07 NOTE — Telephone Encounter (Signed)
Pt needs 30 day post TAVR appointment with Dr. Angelena Form.  I have scheduled echo for 2:00 on October 01, 2014 and appt with Dr. Angelena Form at 3:00 the same day.  I placed call to pt to give her appointment information. Left message to call back

## 2014-09-07 NOTE — Telephone Encounter (Signed)
Spoke with pt and she will be here for these appointments.

## 2014-09-10 ENCOUNTER — Ambulatory Visit (INDEPENDENT_AMBULATORY_CARE_PROVIDER_SITE_OTHER): Payer: Medicare Other | Admitting: Physician Assistant

## 2014-09-10 VITALS — BP 149/74 | HR 63 | Resp 20 | Ht 70.0 in | Wt 175.0 lb

## 2014-09-10 DIAGNOSIS — I350 Nonrheumatic aortic (valve) stenosis: Secondary | ICD-10-CM | POA: Diagnosis not present

## 2014-09-10 DIAGNOSIS — Z952 Presence of prosthetic heart valve: Secondary | ICD-10-CM

## 2014-09-10 DIAGNOSIS — Z954 Presence of other heart-valve replacement: Secondary | ICD-10-CM

## 2014-09-10 NOTE — Progress Notes (Signed)
  HPI:  Patient returns for routine postoperative follow-up having undergone a TAVR via right femoral approach by Dr. Roxy Manns on 08/31/2014  The patient's early postoperative recovery while in the hospital was notable for Since hospital discharge the patient reports   Current Outpatient Prescriptions  Medication Sig Dispense Refill  . amiodarone (PACERONE) 200 MG tablet Take 1 tablet (200 mg total) by mouth daily. 30 tablet 1  . aspirin EC 81 MG EC tablet Take 1 tablet (81 mg total) by mouth daily.    . cetirizine (ZYRTEC) 10 MG tablet Take 10 mg by mouth daily.      Marland Kitchen estrogens, conjugated, (PREMARIN) 1.25 MG tablet Take 1.25 mg by mouth daily.      . folic acid-pyridoxine-cyancobalamin (FOLBIC) 2.5-25-2 MG TABS Take 1 tablet by mouth daily. 90 tablet 3  . furosemide (LASIX) 20 MG tablet Take 20 mg by mouth as needed (fluid retention).     . meloxicam (MOBIC) 15 MG tablet Take 15 mg by mouth daily.      . metoprolol succinate (TOPROL-XL) 25 MG 24 hr tablet     . metoprolol tartrate (LOPRESSOR) 25 MG tablet Take 0.5 tablets (12.5 mg total) by mouth 2 (two) times daily. 31 tablet 1  . oxyCODONE (OXY IR/ROXICODONE) 5 MG immediate release tablet Take 1-2 tablets (5-10 mg total) by mouth every 6 (six) hours as needed for severe pain. 40 tablet 0  . potassium chloride (KLOR-CON) 10 MEQ CR tablet Take 10 mEq by mouth as needed (when taking Lasix).     . rosuvastatin (CRESTOR) 20 MG tablet Take 20 mg by mouth daily.      . valsartan (DIOVAN) 80 MG tablet Take 1 tablet (80 mg total) by mouth daily. 30 tablet 12  . warfarin (COUMADIN) 3 MG tablet Take 3 mg by mouth daily at 6 PM.      No current facility-administered medications for this visit.   Vital Signs: BP 149/74 RR 20 HR 63 Oxygen saturation 97% on room air  Physical Exam: CV-RRR, systolic murmur Pulmonary-Clear to auscultation bilaterally Extremities-no cyanosis, clubbing, or edema Wounds-Right groin wound is clean and dry. Dermabond  intact. No drainage and no erythema. Left groin puncture wound is clean and dry. No hematoma.   Impression and Plan: Overall, Katelyn [name] is recovering well from TAVR. She states her PCP Dr. Everette Rank is following her PT and INR. She has had previous CVAs and has been on Coumadin for quite some time.INR was drawn this past Friday and, according to the patient, her INR was 1.2. She had a fib/flutter post op. She is maintaining sinus rhythm and is currently on Amiodarone 200 mg daily and Lopressor 12.5 mg bid. Hopefully, Amiodarone will be stopped after she sees Dr. Angelena Form in August. Her systolic BP is elevated at this visit. She may require titration of her current medications if continues to be elevated.She inquired about driving. She is NOT taking narcotics for pain. She was instructed she may begin driving short distances (i.e. 30 minutes or less) and gradually increase her frequency and duration as tolerates. Her follow up ECHO and appointment with Dr. Angelena Form is on 10/01/2014. She will return to see [place] on a PRN basis.  Nani Skillern, PA-C [facility] [phone]

## 2014-09-11 ENCOUNTER — Encounter: Payer: Medicare Other | Admitting: Thoracic Surgery (Cardiothoracic Vascular Surgery)

## 2014-10-01 ENCOUNTER — Encounter: Payer: Self-pay | Admitting: Cardiovascular Disease

## 2014-10-01 ENCOUNTER — Ambulatory Visit (HOSPITAL_COMMUNITY): Payer: Medicare Other | Attending: Cardiovascular Disease

## 2014-10-01 ENCOUNTER — Other Ambulatory Visit: Payer: Self-pay

## 2014-10-01 ENCOUNTER — Ambulatory Visit (INDEPENDENT_AMBULATORY_CARE_PROVIDER_SITE_OTHER): Payer: Medicare Other | Admitting: Cardiovascular Disease

## 2014-10-01 VITALS — BP 180/62 | HR 57 | Ht 70.0 in | Wt 176.8 lb

## 2014-10-01 DIAGNOSIS — Z954 Presence of other heart-valve replacement: Secondary | ICD-10-CM | POA: Diagnosis not present

## 2014-10-01 DIAGNOSIS — I10 Essential (primary) hypertension: Secondary | ICD-10-CM | POA: Diagnosis not present

## 2014-10-01 DIAGNOSIS — I350 Nonrheumatic aortic (valve) stenosis: Secondary | ICD-10-CM

## 2014-10-01 DIAGNOSIS — I351 Nonrheumatic aortic (valve) insufficiency: Secondary | ICD-10-CM | POA: Insufficient documentation

## 2014-10-01 DIAGNOSIS — E785 Hyperlipidemia, unspecified: Secondary | ICD-10-CM | POA: Insufficient documentation

## 2014-10-01 DIAGNOSIS — I2510 Atherosclerotic heart disease of native coronary artery without angina pectoris: Secondary | ICD-10-CM | POA: Diagnosis not present

## 2014-10-01 DIAGNOSIS — I359 Nonrheumatic aortic valve disorder, unspecified: Secondary | ICD-10-CM | POA: Diagnosis present

## 2014-10-01 DIAGNOSIS — Z953 Presence of xenogenic heart valve: Secondary | ICD-10-CM

## 2014-10-01 NOTE — Patient Instructions (Signed)
Medication Instructions:  Your physician has recommended you make the following change in your medication: Stop amiodarone   Labwork: none  Testing/Procedures: none  Follow-Up: Your physician recommends that you schedule a follow-up appointment in:  3 months with Dr. Claiborne Billings  Your physician wants you to follow-up in: 12 months with Dr. Angelena Form. You will receive a reminder letter in the mail two months in advance. If you don't receive a letter, please call our office to schedule the follow-up appointment.   Any Other Special Instructions Will Be Listed Below (If Applicable).

## 2014-10-01 NOTE — Progress Notes (Signed)
Chief Complaint  Patient presents with  . Follow-up    History of Present Illness: 74 yo female with history of severe aortic valve stenosis, HTN, HLD, CAD, PAD, prior CVA who is here today in the valve clinic for follow up after her TAVR procedure on 08/31/14. She is followed by Dr. Claiborne Billings. She underwent TAVR on 08/31/14 with placement of a 26 mm Edwards Sapien 3 valve from the right femoral approach. She developed atrial fibrillation post-op and was started on amiodarone. At her surgical f/u visit on 09/10/14 she was in sinus.    She tells me today that she feels well. She is able to do all household chores with minimal LE edema. Resolved at Lasix. NYHA class 2. No chest pain or SOB. No palpitations.   Echo today shows normal LV function with normally functioning valve. Trivial AI.   Primary Care Physician: Everette Rank Primary Cardiologist: Ellouise Newer   Past Medical History  Diagnosis Date  . Hypertension   . Claudication   . Seroma, postoperative     Left Groin  . Coronary artery disease     a. April 2012 which revealed mid RCA occlusion with collaterals, 50% LAD stenosis, 30% circumflex stenosis, and 40% marginal stenosis  . Peripheral vascular disease   . Glaucoma     early stage  . Aortic stenosis, severe   . GERD (gastroesophageal reflux disease)   . Childhood asthma   . Chronic bronchitis        . Arthritis        . Stroke 1982 X 2    "a little bit weaker on the left side since" (08/29/2014)  . 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 Aorto-femoral bypass graft Bilateral 06/27/10  . Vaginal hysterectomy  1970's    Partial   . Pr vein bypass graft,aorto-fem-pop  06/12/10  . Cardiac catheterization N/A 07/25/2014    Procedure: Right/Left Heart Cath and Coronary Angiography;  Surgeon: Troy Sine, MD;   Location: [facility];  Service: Cardiovascular;  Laterality: N/A;  . Colonoscopy    . Multiple tooth extractions    . Dilation and curettage of uterus  "2 or 3"  . Tubal ligation  1970's  . Basal cell carcinoma excision Left     face  . Transcatheter aortic valve replacement, transfemoral N/A 08/31/2014    Procedure: TRANSCATHETER AORTIC VALVE REPLACEMENT, TRANSFEMORAL approach;  Surgeon: Rexene Alberts, MD;  Location: [facility];  Service: Open Heart Surgery;  Laterality: N/A;  . Tee without cardioversion N/A 08/31/2014    Procedure: TRANSESOPHAGEAL ECHOCARDIOGRAM (TEE);  Surgeon: Rexene Alberts, MD;  Location: [facility];  Service: Open Heart Surgery;  Laterality: N/A;    Current Outpatient Prescriptions  Medication Sig Dispense Refill  . cetirizine (ZYRTEC) 10 MG tablet Take 10 mg by mouth daily.      Marland Kitchen estrogens, conjugated, (PREMARIN) 1.25 MG tablet Take 1.25 mg by mouth daily.      . folic acid-pyridoxine-cyancobalamin (FOLBIC) 2.5-25-2 MG TABS Take 1 tablet by mouth daily. 90 tablet 3  . furosemide (LASIX) 20 MG tablet Take 20 mg by mouth as needed (fluid retention).     . meloxicam (MOBIC) 15 MG tablet Take 15 mg by mouth daily.      . metoprolol succinate (TOPROL-XL) 25 MG 24 hr tablet  Take 25 mg by mouth daily.     . potassium chloride (KLOR-CON) 10 MEQ CR tablet Take 10 mEq by mouth as needed (when taking Lasix).     . rosuvastatin (CRESTOR) 20 MG tablet Take 20 mg by mouth daily.      . valsartan (DIOVAN) 80 MG tablet Take 1 tablet (80 mg total) by mouth daily. 30 tablet 12  . warfarin (COUMADIN) 3 MG tablet Take 3 mg by mouth daily at 6 PM.      No current facility-administered medications for this visit.    Allergies  Allergen Reactions  . Adhesive [Tape] Rash  . Latex Rash  . Tetanus Toxoids Rash    History   Social History  . Marital Status: Married    Spouse Name: N/A  . Number of Children: N/A  . Years of Education: N/A   Occupational History  . Not on file.    Social History Main Topics  . Smoking status: Former Smoker -- 2.00 packs/day for 56 years    Types: Cigarettes    Quit date: 06/01/2010  . Smokeless tobacco: Never Used     Comment: HAD WEANED DOWN TO 1/4 PK A DAY BEFORE D/C  . Alcohol Use: 0.0 oz/week    0 Standard drinks or equivalent per week     Comment: 08/29/2014 "never drank much; quit in the 1990's"  . Drug Use: No  . Sexual Activity: Yes   Other Topics Concern  . Not on file   Social History Narrative    Family History  Problem Relation Age of Onset  . Heart disease Mother   . Varicose Veins Mother   . Stroke Father   . Hypertension Father   . Heart disease Father     Aneurysm  . Heart attack Father   . Arthritis Brother   . Diabetes Daughter     Review of Systems:  As stated in the HPI and otherwise negative.   BP 180/62 mmHg  Pulse 57  Ht '5\' 10"'$  (1.778 m)  Wt 176 lb 12.8 oz (80.196 kg)  BMI 25.37 kg/m2  SpO2 95%  Physical Examination: General: Well developed, well nourished, NAD HEENT: OP clear, mucus membranes moist SKIN: warm, dry. No rashes. Neuro: No focal deficits Musculoskeletal: Muscle strength 5/5 all ext Psychiatric: Mood and affect normal Neck: No JVD, no carotid bruits, no thyromegaly, no lymphadenopathy. Lungs:Clear bilaterally, no wheezes, rhonci, crackles Cardiovascular: Regular rate and rhythm. Harsh loud systolic murmur noted. No gallops or rubs. Abdomen:Soft. Bowel sounds present. Non-tender.  Extremities: No lower extremity edema. Pulses are 2 + in the bilateral DP/PT.  Echo 10/01/14: Left ventricle: The cavity size was normal. Wall thickness was increased in a pattern of mild LVH. Systolic function was normal. The estimated ejection fraction was in the range of 60% to 65%. Wall motion was normal; there were no regional wall motion abnormalities. Features are consistent with a pseudonormal left ventricular filling pattern, with concomitant abnormal relaxation and  increased filling pressure (grade 2 diastolic dysfunction). - Aortic valve: There was trivial regurgitation. Mean gradient (S): 16 mm Hg. Peak gradient (S): 26 mm Hg. - Mitral valve: Valve area by pressure half-time: 1.88 cm^2. - Pulmonary arteries: PA peak pressure: 38 mm Hg (S).  EKG:  EKG is not ordered today. The ekg ordered today demonstrates   Recent Labs: 08/29/2014: TSH 2.106 09/01/2014: Magnesium 2.3 09/04/2014: ALT 13*; BUN 15; Creatinine, Ser 1.06*; Hemoglobin 12.2; Platelets 59*; Potassium 4.0; Sodium 139   Lipid Panel  No results found for: CHOL, TRIG, HDL, CHOLHDL, VLDL, LDLCALC, LDLDIRECT   Wt Readings from Last 3 Encounters:  10/01/14 176 lb 12.8 oz (80.196 kg)  09/10/14 175 lb (79.379 kg)  09/05/14 176 lb 4.8 oz (79.969 kg)     Other studies Reviewed: Additional studies/ records that were reviewed today include Review of the above records demonstrates:   Assessment and Plan:   1. Severe aortic valve stenosis: She is now s/p TAVR on 08/31/14 with 26 mm Sapien 3 valve. She is doing well with no SOB or chest pain (NYHA class 2). Echo shows normally functioning bioprosthetic valve with no AI. LVEF is unchanged. Continue coumadin.   2. CAD: Stable. Recent cath. Continue medical therapy  3. Atrial fibrillation, paroxysmal: Occurred following procedure. Sinus today. Will stop amiodarone. She is on coumadin.   Current medicines are reviewed at length with the patient today.  The patient does not have concerns regarding medicines.  The following changes have been made:  no change  Labs/ tests ordered today include:  No orders of the defined types were placed in this encounter.    Disposition:   FU with me one year and Dr. Claiborne Billings 3 months.   Signed, Lauree Chandler, MD 10/02/2014 1:22 PM    [facility] [address], [place], [place]  [address] Phone: [phone]; Fax: [phone]

## 2014-10-15 ENCOUNTER — Encounter: Payer: Medicare Other | Admitting: Internal Medicine

## 2015-01-01 ENCOUNTER — Encounter: Payer: Self-pay | Admitting: Cardiovascular Disease

## 2015-01-01 ENCOUNTER — Ambulatory Visit (INDEPENDENT_AMBULATORY_CARE_PROVIDER_SITE_OTHER): Payer: Medicare Other | Admitting: Cardiovascular Disease

## 2015-01-01 VITALS — BP 152/76 | HR 55 | Ht 69.75 in | Wt 184.5 lb

## 2015-01-01 DIAGNOSIS — I2510 Atherosclerotic heart disease of native coronary artery without angina pectoris: Secondary | ICD-10-CM

## 2015-01-01 DIAGNOSIS — Z954 Presence of other heart-valve replacement: Secondary | ICD-10-CM

## 2015-01-01 DIAGNOSIS — I10 Essential (primary) hypertension: Secondary | ICD-10-CM | POA: Diagnosis not present

## 2015-01-01 DIAGNOSIS — Z952 Presence of prosthetic heart valve: Secondary | ICD-10-CM

## 2015-01-01 DIAGNOSIS — I25810 Atherosclerosis of coronary artery bypass graft(s) without angina pectoris: Secondary | ICD-10-CM | POA: Diagnosis not present

## 2015-01-01 DIAGNOSIS — I447 Left bundle-branch block, unspecified: Secondary | ICD-10-CM

## 2015-01-01 DIAGNOSIS — I4891 Unspecified atrial fibrillation: Secondary | ICD-10-CM | POA: Diagnosis not present

## 2015-01-01 DIAGNOSIS — I70211 Atherosclerosis of native arteries of extremities with intermittent claudication, right leg: Secondary | ICD-10-CM

## 2015-01-01 DIAGNOSIS — E785 Hyperlipidemia, unspecified: Secondary | ICD-10-CM

## 2015-01-01 MED ORDER — VALSARTAN 160 MG PO TABS: 160.0000 mg | ORAL_TABLET | Freq: Every day | ORAL | Status: DC

## 2015-01-01 NOTE — Patient Instructions (Signed)
Your physician has recommended you make the following change in your medication: the valsartan has been increased to 160 mg daily. You can take (2) of your 80 mg tablets daily until completed then start the new 160 mg prescription. This has already been sent to your pharmacy.  Your physician recommends that you return for lab work fasting.  Your physician recommends that you schedule a follow-up appointment in: 4 months with Dr. Claiborne Billings.  If you need a refill on your cardiac medications before your next appointment, please call your pharmacy.

## 2015-01-01 NOTE — Progress Notes (Signed)
Patient ID: AMRITHA YORKE, female   DOB: 1940-08-14, 74 y.o.   MRN: [id number]     HPI: LORENA BENHAM is a 74 y.o. female who presents to the office for a  Follow-up cardiology evaluation  Mrs. Hedstrom has known CAD. Cardiac catheterization in April 2012  revealed mid RCA occlusion with collaterals, 50% LAD stenosis, 30% circumflex stenosis, and 40% marginal stenosis. 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 Trula Slade. She has a history of hypertension with grade 2 diastolic dysfunction, hyperlipidemia, and has developed moderately severe aortic valve stenosis. An echo Doppler study in May 2013 showed a mean transvalvular aortic gradient of 34 with a maximum of 56. In December 2013, her mean gradient was 32 and peak 56 with a valve area of 0.84. An echo Doppler study on 12/06/2012 continued to show normal systolic function with an ejection fraction of 60-65%. She had grade 1 diastolic dysfunction. Peak gradients were  61 mm and mean gradient was 38 mm.  The aortic valve area 0.8-0.9 cm. She had mild to moderate aortic insufficiency. Had mildly thickened mitral valve leaflets with mild MR. The left atrium was mildly dilated. She had mild TR. Pulmonary pressure was mildly increased at 33 mm. A follow-up echo Doppler study on 02/12/2014 continue to show a normal EF at 55-60%. Her aortic valve was severely calcified and stenosis was felt to be severe with a mean gradient of 42, a peak gradient of 64 which had slightly increaed from 38 and 61 mm, respectively.  When I last saw her in February 2016.  She remained asymptomatic.  Socially, the spring of this year.  She began to develop exertional chest discomfort with associated dyspnea and fatigue.  She underwent repeat cardiac catheterization on 07/25/2014 which showed a chronically occluded RCA with left-to-right  collaterals, a 70% diagonal stenosis, 50% ostial circumflex stenosis and 40% AV groove circumflex stenosis.  Her aortic stenosis at further progressed such that she had a mean gradient of 42 mm.  She was felt to be a poor candidate for conventional aortic valve replacement and on 08/31/2014 underwent successful  TAVR with placement of a 26 mm Edward CP and 3 valve from the right femoral approach by Drs. Roxy Manns and [place].  She developed postoperative atrial fibrillation for which she was transiently treated with amiodarone and when seen for office follow-up she was back in sinus rhythm.  Over the last several months, she has noticed slight improvement in energy.  She is unaware of recurrent atrial fibrillation.  She denies recurrent chest tightness.  She at times is fatigued.  Subsequent echo on 10/01/2014 showed an EF of 60-65% with a mean aortic gradient of 16 mm and peak gradient 26 with trivial aortic insufficiency.  She presents for follow-up evaluation.     Past Medical History  Diagnosis Date  . Hypertension   . Claudication ([place])   . Seroma, postoperative     Left Groin  . Coronary artery disease     a. April 2012 which revealed mid RCA occlusion with collaterals, 50% LAD stenosis, 30% circumflex stenosis, and 40% marginal stenosis  . Peripheral vascular disease ([place])   . Glaucoma     early stage  . Aortic stenosis, severe   . GERD (gastroesophageal reflux disease)   . Childhood asthma   . Chronic bronchitis ([place])        . Arthritis        .  Stroke [facility]) 1982 X 2    "a little bit weaker on the left side since" (08/29/2014)  . 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 Aorto-femoral bypass graft Bilateral 06/27/10  . Vaginal hysterectomy  1970's    Partial   . Pr vein bypass graft,aorto-fem-pop  06/12/10  .  Cardiac catheterization N/A 07/25/2014    Procedure: Right/Left Heart Cath and Coronary Angiography;  Surgeon: Troy Sine, MD;  Location: [place] [facility];  Service: Cardiovascular;  Laterality: N/A;  . Colonoscopy    . Multiple tooth extractions    . Dilation and curettage of uterus  "2 or 3"  . Tubal ligation  1970's  . Basal cell carcinoma excision Left     face  . Transcatheter aortic valve replacement, transfemoral N/A 08/31/2014    Procedure: TRANSCATHETER AORTIC VALVE REPLACEMENT, TRANSFEMORAL approach;  Surgeon: Rexene Alberts, MD;  Location: [place];  Service: Open Heart Surgery;  Laterality: N/A;  . Tee without cardioversion N/A 08/31/2014    Procedure: TRANSESOPHAGEAL ECHOCARDIOGRAM (TEE);  Surgeon: Rexene Alberts, MD;  Location: [place];  Service: Open Heart Surgery;  Laterality: N/A;    Allergies  Allergen Reactions  . Adhesive [Tape] Rash  . Latex Rash  . Tetanus Toxoids Rash    Current Outpatient Prescriptions  Medication Sig Dispense Refill  . cetirizine (ZYRTEC) 10 MG tablet Take 10 mg by mouth daily.      Marland Kitchen estrogens, conjugated, (PREMARIN) 1.25 MG tablet Take 1.25 mg by mouth daily.      . folic acid-pyridoxine-cyancobalamin (FOLBIC) 2.5-25-2 MG TABS Take 1 tablet by mouth daily. 90 tablet 3  . furosemide (LASIX) 20 MG tablet Take 20 mg by mouth as needed (fluid retention).     . meloxicam (MOBIC) 15 MG tablet Take 15 mg by mouth daily.      . metoprolol succinate (TOPROL-XL) 25 MG 24 hr tablet Take 25 mg by mouth daily.     . potassium chloride (KLOR-CON) 10 MEQ CR tablet Take 10 mEq by mouth as needed (when taking Lasix).     . rosuvastatin (CRESTOR) 20 MG tablet Take 20 mg by mouth daily.      Marland Kitchen warfarin (COUMADIN) 3 MG tablet Take 3 mg by mouth daily at 6 PM.     . valsartan (DIOVAN) 160 MG tablet Take 1 tablet (160 mg total) by mouth daily. 30 tablet 11   No current facility-administered medications for this visit.    Social History   Social History  .  Marital Status: Married    Spouse Name: N/A  . Number of Children: N/A  . Years of Education: N/A   Occupational History  . Not on file.   Social History Main Topics  . Smoking status: Former Smoker -- 2.00 packs/day for 56 years    Types: Cigarettes    Quit date: 06/01/2010  . Smokeless tobacco: Never Used     Comment: HAD WEANED DOWN TO 1/4 PK A DAY BEFORE D/C  . Alcohol Use: 0.0 oz/week    0 Standard drinks or equivalent per week     Comment: 08/29/2014 "never drank much; quit in the 1990's"  . Drug Use: No  . Sexual Activity: Yes   Other Topics Concern  . Not on file   Social History Narrative    Family  History  Problem Relation Age of Onset  . Heart disease Mother   . Varicose Veins Mother   . Stroke Father   . Hypertension Father   . Heart disease Father     Aneurysm  . Heart attack Father   . Arthritis Brother   . Diabetes Daughter    Social history is notable that she is married has one child. She quit tobacco in April 2012 after a long-standing tobacco history having started smoking at age 77.   ROS General: Negative; No fevers, chills, or night sweats;  HEENT: Negative; No changes in vision or hearing, sinus congestion, difficulty swallowing Pulmonary: Negative; No cough, wheezing, shortness of breath, hemoptysis Cardiovascular: See history of present illness.   GI: Negative; No nausea, vomiting, diarrhea, or abdominal pain GU: Negative; No dysuria, hematuria, or difficulty voiding Musculoskeletal: Negative; no myalgias, joint pain, or weakness Hematologic/Oncology: Negative; no easy bruising, bleeding Endocrine: Negative; no heat/cold intolerance; no diabetes Neuro: Negative; no changes in balance, headaches Skin: Negative; No rashes or skin lesions Psychiatric: Negative; No behavioral problems, depression Sleep: Negative; No snoring, daytime sleepiness, hypersomnolence, bruxism, restless legs, hypnogognic hallucinations, no cataplexy Other  comprehensive 14 point system review is negative.   PE BP 152/76 mmHg  Pulse 55  Ht 5' 9.75" (1.772 m)  Wt 184 lb 8 oz (83.689 kg)  BMI 26.65 kg/m2  General: Alert, oriented, no distress.  Skin: normal turgor, no rashes HEENT: Normocephalic, atraumatic. Pupils round and reactive; sclera anicteric;no lid lag.  Nose without nasal septal hypertrophy Mouth/Parynx benign; Mallinpatti scale 2 Neck: No JVD, no carotid briuts Lungs: clear to ausculatation and percussion; no wheezing or rales Heart: RRR, s1 s2 normal 2/6 systolic murmur compatible with her aortic stenosis that is mid peaking. 6-8/3 diastolic murmur compatible with her aortic insufficiency. Abdomen: soft, nontender; no hepatosplenomehaly, BS+; abdominal aorta nontender and not dilated by palpation. Pulses 2+ Extremities: no clubbing cyanosis or edema, Homan's sign negative  Neurologic: grossly nonfocal Psychological: Normal affect and mood.  ECG (independently read by me): Sinus bradycardia 55 bpm with occasional premature atrial complex.  Left bundle-branch block.  February 2016 ECG (independently read by me):  Sinus bradycardia 57 bpm.  Left bundle branch block with repolarization changes..  No ectopy.  December 2015 ECG (independently read by me): Sinus bradycardia 58 bpm.  Mild left atrial enlargement.  Left axis deviation.  Left bundle branch block with repolarization changes.  October 2014 ECG: Sinus bradycardia with left axis deviation. Left bundle branch block with repolarization changes  LAB BMP Latest Ref Rng 09/04/2014 09/03/2014 09/02/2014  Glucose 65 - 99 mg/dL 109(H) 112(H) 113(H)  BUN 6 - 20 mg/dL '15 12 8  ' Creatinine 0.44 - 1.00 mg/dL 1.06(H) 1.04(H) 0.99  Sodium 135 - 145 mmol/L 139 138 139  Potassium 3.5 - 5.1 mmol/L 4.0 3.9 3.4(L)  Chloride 101 - 111 mmol/L 104 105 103  CO2 22 - 32 mmol/L '27 25 29  ' Calcium 8.9 - 10.3 mg/dL 8.2(L) 8.0(L) 7.9(L)   Hepatic Function Latest Ref Rng 09/04/2014 08/29/2014 06/28/2010    Total Protein 6.5 - 8.1 g/dL 5.3(L) 6.6 4.6(L)  Albumin 3.5 - 5.0 g/dL 2.8(L) 3.8 2.4(L)  AST 15 - 41 U/L 15 27 33  ALT 14 - 54 U/L 13(L) 18 18  Alk Phosphatase 38 - 126 U/L 54 68 46  Total Bilirubin 0.3 - 1.2 mg/dL 0.4 0.5 0.3   CBC Latest Ref Rng 09/04/2014 09/03/2014 09/01/2014  WBC 4.0 - 10.5 K/uL 7.0 8.0 8.8  Hemoglobin 12.0 - 15.0 g/dL 12.2 12.2 11.5(L)  Hematocrit 36.0 - 46.0 % 37.5 37.3 34.9(L)  Platelets 150 - 400 K/uL 59(L) 56(L) 69(L)   Lab Results  Component Value Date   MCV 93.5 09/04/2014   MCV 94.0 09/03/2014   MCV 92.8 09/01/2014   Lab Results  Component Value Date   TSH 2.106 08/29/2014   Lipid Panel  No results found for: CHOL, TRIG, HDL, CHOLHDL, VLDL, LDLCALC, LDLDIRECT   RADIOLOGY: No results found.    ASSESSMENT AND PLAN:  Ms. Sommerfield is a 74 year old female who has documented CAD, NP, VD, who developed progressive aortic valve stenosis.  Due to preoperative candidacy with traditional AVR.  She underwent successful Taber procedure on 08/31/2014 with placement of a 26 mm Edward C.  3.  Valve from the right femoral approach.  Presently, she denies any anginal symptoms.  She denies presyncope or syncope.  She denies any increasing shortness of breath.  She is maintaining sinus rhythm but does have occasional PACs.  Her blood pressure today is elevated at 152/76.  Review of her last office visit with Dr. Aleen Sells also indicates significant blood pressure elevation.  For this reason, I am further titrating her valsartan from 80 mg to 160 mg daily for more optimal blood pressure control.  She continues to be on warfarin anticoagulation and her INR yesterday was therapeutic at 3.3.  She denies any bleeding.  In 1-2 weeks she will undergo complete set of fasting laboratory.  She is on Crestor 20 mg for hyperlipidemia with target LDL  less than 70 in this patient with established CAD and PVD.  Presently she is not having significant edema and continues to do well with  furosemide.  I will contact her regarding her laboratory and adjustments to her medical regimen will be made if necessary.  I will see her in 4 months for reevaluation or sooner if problems arise.   Time spent: 25 minutes   Troy Sine, MD, [facility]  01/01/2015 2:30 PM

## 2015-01-14 ENCOUNTER — Other Ambulatory Visit (HOSPITAL_COMMUNITY): Payer: Self-pay | Admitting: Family Medicine

## 2015-01-14 DIAGNOSIS — Z1231 Encounter for screening mammogram for malignant neoplasm of breast: Secondary | ICD-10-CM

## 2015-01-17 LAB — COMPREHENSIVE METABOLIC PANEL
ALT: 12 U/L (ref 6–29)
AST: 21 U/L (ref 10–35)
Albumin: 3.7 g/dL (ref 3.6–5.1)
Alkaline Phosphatase: 63 U/L (ref 33–130)
BUN: 16 mg/dL (ref 7–25)
CO2: 30 mmol/L (ref 20–31)
CREATININE: 0.98 mg/dL — AB (ref 0.60–0.93)
Calcium: 9.4 mg/dL (ref 8.6–10.4)
Chloride: 103 mmol/L (ref 98–110)
Glucose, Bld: 84 mg/dL (ref 65–99)
Potassium: 4.8 mmol/L (ref 3.5–5.3)
Sodium: 139 mmol/L (ref 135–146)
TOTAL PROTEIN: 6.4 g/dL (ref 6.1–8.1)
Total Bilirubin: 0.7 mg/dL (ref 0.2–1.2)

## 2015-01-17 LAB — LIPID PANEL
CHOLESTEROL: 134 mg/dL (ref 125–200)
HDL: 54 mg/dL (ref >=46)
LDL CALC: 40 mg/dL (ref <130)
Total CHOL/HDL Ratio: 2.5 Ratio (ref <=5.0)
Triglycerides: 202 mg/dL — ABNORMAL HIGH (ref <150)
VLDL: 40 mg/dL — AB (ref <30)

## 2015-01-17 LAB — CBC
HCT: 46.5 % — ABNORMAL HIGH (ref 36.0–46.0)
Hemoglobin: 15.5 g/dL — ABNORMAL HIGH (ref 12.0–15.0)
MCH: 30.4 pg (ref 26.0–34.0)
MCHC: 33.3 g/dL (ref 30.0–36.0)
MCV: 91.2 fL (ref 78.0–100.0)
MPV: 12.2 fL (ref 8.6–12.4)
Platelets: 117 10*3/uL — ABNORMAL LOW (ref 150–400)
RBC: 5.1 MIL/uL (ref 3.87–5.11)
RDW: 14.1 % (ref 11.5–15.5)
WBC: 6.9 10*3/uL (ref 4.0–10.5)

## 2015-01-17 LAB — TSH: TSH: 3.997 u[IU]/mL (ref 0.350–4.500)

## 2015-01-21 ENCOUNTER — Ambulatory Visit (HOSPITAL_COMMUNITY)
Admission: RE | Admit: 2015-01-21 | Discharge: 2015-01-21 | Disposition: A | Discharge status: Home / Self Care | Payer: Medicare Other | Source: Ambulatory Visit | Attending: Family Medicine | Admitting: Family Medicine

## 2015-01-21 DIAGNOSIS — Z1231 Encounter for screening mammogram for malignant neoplasm of breast: Secondary | ICD-10-CM

## 2015-02-26 LAB — PROTIME-INR

## 2015-02-28 ENCOUNTER — Encounter: Payer: Self-pay | Admitting: Family

## 2015-03-08 ENCOUNTER — Other Ambulatory Visit (HOSPITAL_COMMUNITY): Payer: Self-pay | Admitting: Cardiovascular Disease

## 2015-03-11 ENCOUNTER — Ambulatory Visit (HOSPITAL_COMMUNITY)
Admission: RE | Admit: 2015-03-11 | Discharge: 2015-03-11 | Disposition: A | Discharge status: Home / Self Care | Payer: Medicare Other | Source: Ambulatory Visit | Attending: Family | Admitting: Family

## 2015-03-11 ENCOUNTER — Encounter: Payer: Self-pay | Admitting: Family

## 2015-03-11 ENCOUNTER — Ambulatory Visit (INDEPENDENT_AMBULATORY_CARE_PROVIDER_SITE_OTHER): Payer: Medicare Other | Admitting: Family

## 2015-03-11 VITALS — BP 174/83 | HR 60 | Temp 97.9°F | Resp 16 | Ht 69.5 in | Wt 185.0 lb

## 2015-03-11 DIAGNOSIS — I7409 Other arterial embolism and thrombosis of abdominal aorta: Secondary | ICD-10-CM

## 2015-03-11 DIAGNOSIS — R938 Abnormal findings on diagnostic imaging of other specified body structures: Secondary | ICD-10-CM | POA: Insufficient documentation

## 2015-03-11 DIAGNOSIS — Z48812 Encounter for surgical aftercare following surgery on the circulatory system: Secondary | ICD-10-CM | POA: Diagnosis not present

## 2015-03-11 DIAGNOSIS — I739 Peripheral vascular disease, unspecified: Secondary | ICD-10-CM | POA: Diagnosis not present

## 2015-03-11 DIAGNOSIS — I10 Essential (primary) hypertension: Secondary | ICD-10-CM | POA: Insufficient documentation

## 2015-03-11 DIAGNOSIS — Z95828 Presence of other vascular implants and grafts: Secondary | ICD-10-CM

## 2015-03-11 DIAGNOSIS — Z87891 Personal history of nicotine dependence: Secondary | ICD-10-CM

## 2015-03-11 DIAGNOSIS — Z4889 Encounter for other specified surgical aftercare: Secondary | ICD-10-CM

## 2015-03-11 DIAGNOSIS — Z8679 Personal history of other diseases of the circulatory system: Secondary | ICD-10-CM | POA: Diagnosis not present

## 2015-03-11 NOTE — Patient Instructions (Signed)
Peripheral Vascular Disease Peripheral vascular disease (PVD) is a disease of the blood vessels that are not part of your heart and brain. A simple term for PVD is poor circulation. In most cases, PVD narrows the blood vessels that carry blood from your heart to the rest of your body. This can result in a decreased supply of blood to your arms, legs, and internal organs, like your stomach or kidneys. However, it most often affects a person's lower legs and feet. There are two types of PVD.  Organic PVD. This is the more common type. It is caused by damage to the structure of blood vessels.  Functional PVD. This is caused by conditions that make blood vessels contract and tighten (spasm). Without treatment, PVD tends to get worse over time. PVD can also lead to acute ischemic limb. This is when an arm or limb suddenly has trouble getting enough blood. This is a medical emergency. CAUSES Each type of PVD has many different causes. The most common cause of PVD is buildup of a fatty material (plaque) inside of your arteries (atherosclerosis). Small amounts of plaque can break off from the walls of the blood vessels and become lodged in a smaller artery. This blocks blood flow and can cause acute ischemic limb. Other common causes of PVD include:  Blood clots that form inside of blood vessels.  Injuries to blood vessels.  Diseases that cause inflammation of blood vessels or cause blood vessel spasms.  Health behaviors and health history that increase your risk of developing PVD. RISK FACTORS  You may have a greater risk of PVD if you:  Have a family history of PVD.  Have certain medical conditions, including:  High cholesterol.  Diabetes.  High blood pressure (hypertension).  Coronary heart disease.  Past problems with blood clots.  Past injury, such as burns or a broken bone. These may have damaged blood vessels in your limbs.  Buerger disease. This is caused by inflamed blood  vessels in your hands and feet.  Some forms of arthritis.  Rare birth defects that affect the arteries in your legs.  Use tobacco.  Do not get enough exercise.  Are obese.  Are age 50 or older. SIGNS AND SYMPTOMS  PVD may cause many different symptoms. Your symptoms depend on what part of your body is not getting enough blood. Some common signs and symptoms include:  Cramps in your lower legs. This may be a symptom of poor leg circulation (claudication).  Pain and weakness in your legs while you are physically active that goes away when you rest (intermittent claudication).  Leg pain when at rest.  Leg numbness, tingling, or weakness.  Coldness in a leg or foot, especially when compared with the other leg.  Skin or hair changes. These can include:  Hair loss.  Shiny skin.  Pale or bluish skin.  Thick toenails.  Inability to get or maintain an erection (erectile dysfunction). People with PVD are more prone to developing ulcers and sores on their toes, feet, or legs. These may take longer than normal to heal. DIAGNOSIS Your health care provider may diagnose PVD from your signs and symptoms. The health care provider will also do a physical exam. You may have tests to find out what is causing your PVD and determine its severity. Tests may include:  Blood pressure recordings from your arms and legs and measurements of the strength of your pulses (pulse volume recordings).  Imaging studies using sound waves to take pictures of   the blood flow through your blood vessels (Doppler ultrasound).  Injecting a dye into your blood vessels before having imaging studies using:  X-rays (angiogram or arteriogram).  Computer-generated X-rays (CT angiogram).  A powerful electromagnetic field and a computer (magnetic resonance angiogram or MRA). TREATMENT Treatment for PVD depends on the cause of your condition and the severity of your symptoms. It also depends on your age. Underlying  causes need to be treated and controlled. These include long-lasting (chronic) conditions, such as diabetes, high cholesterol, and high blood pressure. You may need to first try making lifestyle changes and taking medicines. Surgery may be needed if these do not work. Lifestyle changes may include:  Quitting smoking.  Exercising regularly.  Following a low-fat, low-cholesterol diet. Medicines may include:  Blood thinners to prevent blood clots.  Medicines to improve blood flow.  Medicines to improve your blood cholesterol levels. Surgical procedures may include:  A procedure that uses an inflated balloon to open a blocked artery and improve blood flow (angioplasty).  A procedure to put in a tube (stent) to keep a blocked artery open (stent implant).  Surgery to reroute blood flow around a blocked artery (peripheral bypass surgery).  Surgery to remove dead tissue from an infected wound on the affected limb.  Amputation. This is surgical removal of the affected limb. This may be necessary in cases of acute ischemic limb that are not improved through medical or surgical treatments. HOME CARE INSTRUCTIONS  Take medicines only as directed by your health care provider.  Do not use any tobacco products, including cigarettes, chewing tobacco, or electronic cigarettes. If you need help quitting, ask your health care provider.  Lose weight if you are overweight, and maintain a healthy weight as directed by your health care provider.  Eat a diet that is low in fat and cholesterol. If you need help, ask your health care provider.  Exercise regularly. Ask your health care provider to suggest some good activities for you.  Use compression stockings or other mechanical devices as directed by your health care provider.  Take good care of your feet.  Wear comfortable shoes that fit well.  Check your feet often for any cuts or sores. SEEK MEDICAL CARE IF:  You have cramps in your legs  while walking.  You have leg pain when you are at rest.  You have coldness in a leg or foot.  Your skin changes.  You have erectile dysfunction.  You have cuts or sores on your feet that are not healing. SEEK IMMEDIATE MEDICAL CARE IF:  Your arm or leg turns cold and blue.  Your arms or legs become red, warm, swollen, painful, or numb.  You have chest pain or trouble breathing.  You suddenly have weakness in your face, arm, or leg.  You become very confused or lose the ability to speak.  You suddenly have a very bad headache or lose your vision.   This information is not intended to replace advice given to you by your health care provider. Make sure you discuss any questions you have with your health care provider.   Document Released: 03/26/2004 Document Revised: 03/09/2014 Document Reviewed: 07/27/2013 [employer]vier Interactive Patient Education 2016 [employer]  

## 2015-03-11 NOTE — Progress Notes (Signed)
[facility] [place] HISTORY AND PHYSICAL -PAD  History of Present Illness Katelyn [name] is a 75 y.o. female patient of Dr.Brabham who is status post aortobifemoral bypass graft in April of 2012. This was done for bilateral claudication. Postoperatively, she has had issues with swelling.  She no longer has swelling in her lower legs since she stopped amlodipine, but she still wears compression stockings.  She still has numbness from both groins to her toes as constant and mild.  She does have a history of back issues. Dr. Vertell Limber performed injections without benefit to her numbness.  She denies claudication symptoms, denies non-healing wounds.  She had 2 strokes in 1982, states Dr. Georgina Peer, cardiologist, has been monitoring her carotid arteries, she states that she has a known heart murmur.  Patient states dramatic improvement in ability to walk without claudication symptoms since the above surgery; prior to the aortobifemoral bypass graft she would get cludication symptoms in both thighs and calves after 5 steps, and her toes were turning black.  Now her toes are pink and warm.  Patient reports New Medical or Surgical History: aortic valve replaced July 2016 with a bovine valve. She has felt tired since this, she is slowly getting her energy back.  Pt states her blood pressure increases in a medical office, that at home it runs 120-140/60-80.  Pt Diabetic: No  Pt smoker: former smoker, quit in 2012   Pt meds include:  Statin :Yes  Betablocker: Yes  ASA: no  Other anticoagulants/antiplatelets: takes coumadin for ICA stenosis and since she had strokes, prescribed by Dr. Leighton Roach.     Past Medical History  Diagnosis Date  . Hypertension   . Claudication ([place])   . Seroma, postoperative     Left Groin  . Coronary artery disease     a. April 2012 which revealed mid RCA occlusion with collaterals, 50% LAD stenosis, 30% circumflex stenosis, and 40% marginal stenosis  .  Peripheral vascular disease ([place])   . Glaucoma     early stage  . Aortic stenosis, severe   . GERD (gastroesophageal reflux disease)   . Childhood asthma   . Chronic bronchitis ([place])        . Arthritis        . Stroke [facility]) 1982 X 2    "a little bit weaker on the left side since" (08/29/2014)  . 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 Smoking status: Former Smoker -- 2.00 packs/day for 56 years    Types: Cigarettes    Quit date: 06/01/2010  . Smokeless tobacco: Never Used     Comment: HAD WEANED DOWN TO 1/4 PK A DAY BEFORE D/C  . Alcohol Use: 0.0 oz/week    0 Standard drinks or equivalent per week     Comment: 08/29/2014 "never drank much; quit in the 1990's"    Family History Family History  Problem Relation Age of Onset  . Heart disease Mother   . Varicose Veins Mother   . Stroke Father   . Hypertension Father   . Heart disease Father     Aneurysm  . Heart attack Father   . Arthritis Brother   . Diabetes Daughter     Past Surgical History  Procedure Laterality Date  . Aorto-femoral bypass graft Bilateral 06/27/10  . Vaginal  hysterectomy  1970's    Partial   . Pr vein bypass graft,aorto-fem-pop  06/12/10  . Cardiac catheterization N/A 07/25/2014    Procedure: Right/Left Heart Cath and Coronary Angiography;  Surgeon: Troy Sine, MD;  Location: [place] [facility];  Service: Cardiovascular;  Laterality: N/A;  . Colonoscopy    . Multiple tooth extractions    . Dilation and curettage of uterus  "2 or 3"  . Tubal ligation  1970's  . Basal cell carcinoma excision Left     face  . Transcatheter aortic valve replacement, transfemoral N/A 08/31/2014    Procedure: TRANSCATHETER AORTIC VALVE REPLACEMENT, TRANSFEMORAL approach;  Surgeon: Rexene Alberts, MD;  Location: [facility];  Service: Open Heart  Surgery;  Laterality: N/A;  . Tee without cardioversion N/A 08/31/2014    Procedure: TRANSESOPHAGEAL ECHOCARDIOGRAM (TEE);  Surgeon: Rexene Alberts, MD;  Location: [place];  Service: Open Heart Surgery;  Laterality: N/A;    Allergies  Allergen Reactions  . Adhesive [Tape] Rash  . Latex Rash  . Tetanus Toxoids Rash    Current Outpatient Prescriptions  Medication Sig Dispense Refill  . cetirizine (ZYRTEC) 10 MG tablet Take 10 mg by mouth daily.      Marland Kitchen estrogens, conjugated, (PREMARIN) 1.25 MG tablet Take 1.25 mg by mouth daily.      . folic acid-pyridoxine-cyancobalamin (FOLBIC) 2.5-25-2 MG TABS Take 1 tablet by mouth daily. 90 tablet 3  . furosemide (LASIX) 20 MG tablet Take 20 mg by mouth as needed (fluid retention).     . meloxicam (MOBIC) 15 MG tablet Take 15 mg by mouth daily.      . metoprolol succinate (TOPROL-XL) 25 MG 24 hr tablet Take 25 mg by mouth daily.     . metoprolol succinate (TOPROL-XL) 25 MG 24 hr tablet TAKE ONE TABLET BY MOUTH ONCE DAILY 30 tablet 2  . potassium chloride (KLOR-CON) 10 MEQ CR tablet Take 10 mEq by mouth as needed (when taking Lasix).     . rosuvastatin (CRESTOR) 20 MG tablet Take 20 mg by mouth daily.      . valsartan (DIOVAN) 160 MG tablet Take 1 tablet (160 mg total) by mouth daily. 30 tablet 11  . warfarin (COUMADIN) 3 MG tablet Take 3 mg by mouth daily at 6 PM.      No current facility-administered medications for this visit.    ROS: See HPI for pertinent positives and negatives.   Physical Examination  Filed Vitals:   03/11/15 1324 03/11/15 1329  BP: 158/80 174/83  Pulse: 60 60  Temp: 97.9 F (36.6 C)   TempSrc: Oral   Resp: 16   Height: 5' 9.5" (1.765 m)   Weight: 185 lb (83.915 kg)   SpO2: 97%    Body mass index is 26.94 kg/(m^2).   General: A&O x 3, WDWN,  Gait: normal Eyes: PERRLA, Pulmonary: CTAB, without wheezes, no rales or rhonchi. Cardiac: regular rythm, with murmur     Carotid Bruits Left Right    Transmitted cardiac murmur Transmitted cardiac murmur  Aorta: is not palpable. Radial pulses: 2+ and equal   VASCULAR EXAM: Extremities without ischemic changes  without Gangrene; without open wounds.     LE Pulses LEFT RIGHT   FEMORAL  not palpable  3+palpable    POPLITEAL not palpable  not palpable   POSTERIOR TIBIAL 2+ palpable   2+palpable    DORSALIS PEDIS  ANTERIOR TIBIAL 2+palpable  2+palpable    Abdomen: soft, NT, no palpable masses. Skin:  no rashes, no ulcers. Musculoskeletal: no muscle wasting or atrophy. Neurologic: A&O X 3; Appropriate Affect ; SENSATION: normal; MOTOR FUNCTION: moving all extremities equally, motor strength 5/5 throughout. Speech is fluent/normal. CN 2-12 intact.          Non-Invasive Vascular Imaging: DATE: 03/11/2015 ABI (Date: 03/11/2015)  R: 1.10 (1.09, 12//22/15), DP: triphasic, PT: triphasic, TBI: 0.71  L: 1.11 (1.04), DP: triphasic, PT: triphasic, TBI: 0.76   ASSESSMENT: GALENA LOGIE is a 75 y.o. female who is status post aortobifemoral bypass graft in April of 2012 and had dramatic resolution of her severe bilateral claudication symptoms since this surgery. There are no signs of ischemia in her feet/legs.  Her ABI's and TBI's remain normal with all triphasic waveforms.     PLAN:  Continued graduated walking program.   Based on the patient's vascular studies and examination, pt will return to clinic in 1 year for ABI's. I advised her to notify our office if she develops concerns re the circulation in her feet/legs.  I discussed in depth with the patient the nature of atherosclerosis, and emphasized the importance of maximal medical management including strict control of blood pressure, blood  glucose, and lipid levels, obtaining regular exercise, and continued cessation of smoking.  The patient is aware that without maximal medical management the underlying atherosclerotic disease process will progress, limiting the benefit of any interventions.  The patient was given information about PAD including signs, symptoms, treatment, what symptoms should prompt the patient to seek immediate medical care, and risk reduction measures to take.  Clemon Chambers, RN, MSN, FNP-C [facility] Phone: [phone]  Clinic MD: Trula Slade  03/11/2015 12:47 PM

## 2015-03-12 NOTE — Addendum Note (Signed)
Addended by: Dorthula Rue L on: 03/12/2015 12:04 PM   Modules accepted: Orders

## 2015-03-29 DIAGNOSIS — Z7901 Long term (current) use of anticoagulants: Secondary | ICD-10-CM | POA: Diagnosis not present

## 2015-04-03 DIAGNOSIS — Z7901 Long term (current) use of anticoagulants: Secondary | ICD-10-CM | POA: Diagnosis not present

## 2015-04-03 DIAGNOSIS — I69954 Hemiplegia and hemiparesis following unspecified cerebrovascular disease affecting left non-dominant side: Secondary | ICD-10-CM | POA: Diagnosis not present

## 2015-04-03 DIAGNOSIS — N951 Menopausal and female climacteric states: Secondary | ICD-10-CM | POA: Diagnosis not present

## 2015-04-03 DIAGNOSIS — E785 Hyperlipidemia, unspecified: Secondary | ICD-10-CM | POA: Diagnosis not present

## 2015-04-29 DIAGNOSIS — Z7901 Long term (current) use of anticoagulants: Secondary | ICD-10-CM | POA: Diagnosis not present

## 2015-05-06 ENCOUNTER — Encounter: Payer: Self-pay | Admitting: Cardiovascular Disease

## 2015-05-06 ENCOUNTER — Ambulatory Visit (INDEPENDENT_AMBULATORY_CARE_PROVIDER_SITE_OTHER): Payer: Medicare Other | Admitting: Cardiovascular Disease

## 2015-05-06 VITALS — BP 148/78 | HR 54 | Ht 69.5 in | Wt 183.0 lb

## 2015-05-06 DIAGNOSIS — Z954 Presence of other heart-valve replacement: Secondary | ICD-10-CM

## 2015-05-06 DIAGNOSIS — I2510 Atherosclerotic heart disease of native coronary artery without angina pectoris: Secondary | ICD-10-CM

## 2015-05-06 DIAGNOSIS — I447 Left bundle-branch block, unspecified: Secondary | ICD-10-CM | POA: Diagnosis not present

## 2015-05-06 DIAGNOSIS — I739 Peripheral vascular disease, unspecified: Secondary | ICD-10-CM

## 2015-05-06 DIAGNOSIS — I10 Essential (primary) hypertension: Secondary | ICD-10-CM

## 2015-05-06 DIAGNOSIS — E785 Hyperlipidemia, unspecified: Secondary | ICD-10-CM

## 2015-05-06 DIAGNOSIS — Z952 Presence of prosthetic heart valve: Secondary | ICD-10-CM

## 2015-05-06 MED ORDER — VALSARTAN 160 MG PO TABS: ORAL_TABLET | ORAL | Status: DC

## 2015-05-06 NOTE — Patient Instructions (Addendum)
Your physician recommends that you schedule a follow-up appointment with Dr. Angelena Form in  August. And Dr Claiborne Billings in February 2018.  Your physician has requested that you have an echocardiogram. Echocardiography is a painless test that uses sound waves to create images of your heart. It provides your doctor with information about the size and shape of your heart and how well your heart's chambers and valves are working. This procedure takes approximately one hour. There are no restrictions for this procedure. THIS WILL BE DONE IN July.

## 2015-05-07 ENCOUNTER — Encounter: Payer: Self-pay | Admitting: Cardiovascular Disease

## 2015-05-07 NOTE — Progress Notes (Signed)
Patient ID: Katelyn [name], female   DOB: 11-11-1940, 75 y.o.   MRN: [id number]     HPI: Katelyn [name] is a 75 y.o. female who presents to the office for a  Follow-up cardiology evaluation  [name] has known CAD and underwent initial cardiac catheterization in  April 2012 which revealed mid RCA occlusion with collaterals, 50% LAD stenosis, 30% circumflex stenosis, and 40% marginal stenosis. 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 Trula Slade. She has a history of hypertension with grade 2 diastolic dysfunction, hyperlipidemia, and has developed moderately severe aortic valve stenosis. An echo Doppler study in May 2013 showed a mean transvalvular aortic gradient of 34 with a maximum of 56. In December 2013, her mean gradient was 32 and peak 56 with a valve area of 0.84. An echo Doppler study on 12/06/2012 continued to show normal systolic function with an ejection fraction of 60-65%. She had grade 1 diastolic dysfunction. Peak gradients were  61 mm and mean gradient was 38 mm.  The aortic valve area 0.8-0.9 cm. She had mild to moderate aortic insufficiency. Had mildly thickened mitral valve leaflets with mild MR. The left atrium was mildly dilated. She had mild TR. Pulmonary pressure was mildly increased at 33 mm.  A follow-up echo Doppler study on 02/12/2014  Showed normal EF at 55-60%. Her aortic valve was severely calcified and stenosis was felt to be severe with a mean gradient of 42, a peak gradient of 64 which had slightly increaed from 38 and 61 mm, respectively.  She began to develop exertional chest discomfort with associated dyspnea and fatigue.  She underwent repeat cardiac catheterization on 07/25/2014 which showed a chronically occluded RCA with left-to-right collaterals, a 70% diagonal stenosis, 50% ostial circumflex stenosis and 40% AV groove  circumflex stenosis.  Her aortic stenosis at further progressed such that she had a mean gradient of 42 mm.  She was felt to be a poor candidate for conventional aortic valve replacement and on 08/31/2014 underwent successful  TAVR with placement of a 26 mm Edward Sapien 3 valve from the right femoral approach by Drs. Roxy Manns and [place].  She developed postoperative atrial fibrillation for which she was transiently treated with amiodarone and when seen for office follow-up she was back in sinus rhythm.  She has noticed improvement in energy.  She is unaware of recurrent atrial fibrillation.  She denies recurrent chest tightness.  She at times is fatigued.  A subsequent echo on 10/01/2014 showed an EF of 60-65% with a mean aortic gradient of 16 mm and peak gradient 26 with trivial aortic insufficiency.    Since I last saw her, she admits to experiencing one very brief episode of palpitations.  He denies any episodes of chest pain.  She admits to mild shortness of breath with walking.  She hasn't maintained on Toprol-XL 25 mg, valsartan 160 mg and has a prescription for furosemide as needed for swelling.  She continues to be on warfarin anticoagulation.  She has been on rosuvastatin 20 mg for hyperlipidemia.  She presents for reevaluation.   Past Medical History  Diagnosis Date  . Hypertension   . Claudication ([place])   . Seroma, postoperative     Left Groin  . Coronary artery disease     a. April 2012 which revealed mid RCA occlusion with collaterals, 50% LAD stenosis, 30% circumflex stenosis, and 40% marginal  stenosis  . Peripheral vascular disease ([place])   . Glaucoma     early stage  . Aortic stenosis, severe   . GERD (gastroesophageal reflux disease)   . Childhood asthma   . Chronic bronchitis ([place])        . Arthritis        . Stroke [facility]) 1982 X 2    "a little bit weaker on the left side since" (08/29/2014)  . 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 Aorto-femoral bypass graft Bilateral 06/27/10  . Vaginal hysterectomy  1970's    Partial   . Pr vein bypass graft,aorto-fem-pop  06/12/10  . Cardiac catheterization N/A 07/25/2014    Procedure: Right/Left Heart Cath and Coronary Angiography;  Surgeon: Troy Sine, MD;  Location: [place] [facility];  Service: Cardiovascular;  Laterality: N/A;  . Colonoscopy    . Multiple tooth extractions    . Dilation and curettage of uterus  "2 or 3"  . Tubal ligation  1970's  . Basal cell carcinoma excision Left     face  . Transcatheter aortic valve replacement, transfemoral N/A 08/31/2014    Procedure: TRANSCATHETER AORTIC VALVE REPLACEMENT, TRANSFEMORAL approach;  Surgeon: Rexene Alberts, MD;  Location: [place];  Service: Open Heart Surgery;  Laterality: N/A;  . Tee without cardioversion N/A 08/31/2014    Procedure: TRANSESOPHAGEAL ECHOCARDIOGRAM (TEE);  Surgeon: Rexene Alberts, MD;  Location: [place];  Service: Open Heart Surgery;  Laterality: N/A;    Allergies  Allergen Reactions  . Adhesive [Tape] Rash  . Latex Rash  . Tetanus Toxoids Rash    Current Outpatient Prescriptions  Medication Sig Dispense Refill  . cetirizine (ZYRTEC) 10 MG tablet Take 10 mg by mouth daily.      Marland Kitchen estradiol (ESTRACE) 1 MG tablet 1 mg once.    . folic acid-pyridoxine-cyancobalamin (FOLBIC) 2.5-25-2 MG TABS Take 1 tablet by mouth daily. 90 tablet 3  . furosemide (LASIX) 20 MG tablet Take 20 mg by mouth as needed (fluid retention).     . meloxicam (MOBIC) 15 MG tablet Take 15 mg by mouth daily.      . metoprolol succinate (TOPROL-XL) 25 MG 24 hr tablet Take 25 mg by mouth daily.     . metoprolol succinate (TOPROL-XL) 25 MG 24 hr tablet TAKE ONE TABLET BY MOUTH ONCE DAILY 30 tablet 2  . potassium chloride (KLOR-CON) 10 MEQ CR tablet Take 10 mEq by mouth as needed (when taking Lasix).      . rosuvastatin (CRESTOR) 20 MG tablet Take 20 mg by mouth daily.      . valsartan (DIOVAN) 160 MG tablet Take 1 tablet alternating with 1/2 tablet daily 30 tablet 11  . warfarin (COUMADIN) 3 MG tablet Take 3 mg by mouth daily at 6 PM.      No current facility-administered medications for this visit.    Social History   Social History  . Marital Status: Married    Spouse Name: N/A  . Number of Children: N/A  . Years of Education: N/A   Occupational History  . Not on file.   Social History Main Topics  . Smoking status: Former Smoker -- 2.00 packs/day for 56 years    Types: Cigarettes    Quit date: 06/01/2010  . Smokeless tobacco: Never Used  Comment: HAD WEANED DOWN TO 1/4 PK A DAY BEFORE D/C  . Alcohol Use: 0.0 oz/week    0 Standard drinks or equivalent per week     Comment: 08/29/2014 "never drank much; quit in the 1990's"  . Drug Use: No  . Sexual Activity: Yes   Other Topics Concern  . Not on file   Social History Narrative    Family History  Problem Relation Age of Onset  . Heart disease Mother   . Varicose Veins Mother   . Stroke Father   . Hypertension Father   . Heart disease Father     Aneurysm  . Heart attack Father   . Arthritis Brother   . Diabetes Daughter    Social history is notable that she is married has one child. She quit tobacco in April 2012 after a long-standing tobacco history having started smoking at age 74.   ROS General: Negative; No fevers, chills, or night sweats;  HEENT: Negative; No changes in vision or hearing, sinus congestion, difficulty swallowing Pulmonary: Negative; No cough, wheezing, shortness of breath, hemoptysis Cardiovascular: See history of present illness.   GI: Negative; No nausea, vomiting, diarrhea, or abdominal pain GU: Negative; No dysuria, hematuria, or difficulty voiding Musculoskeletal: Negative; no myalgias, joint pain, or weakness Hematologic/Oncology: Negative; no easy bruising, bleeding Endocrine:  Negative; no heat/cold intolerance; no diabetes Neuro: Negative; no changes in balance, headaches Skin: Negative; No rashes or skin lesions Psychiatric: Negative; No behavioral problems, depression Sleep: Negative; No snoring, daytime sleepiness, hypersomnolence, bruxism, restless legs, hypnogognic hallucinations, no cataplexy Other comprehensive 14 point system review is negative.   PE BP 148/78 mmHg  Pulse 54  Ht 5' 9.5" (1.765 m)  Wt 183 lb (83.008 kg)  BMI 26.65 kg/m2   Repeat blood pressure by me 132/80  Wt Readings from Last 3 Encounters:  05/06/15 183 lb (83.008 kg)  03/11/15 185 lb (83.915 kg)  01/01/15 184 lb 8 oz (83.689 kg)   General: Alert, oriented, no distress.  Skin: normal turgor, no rashes HEENT: Normocephalic, atraumatic. Pupils round and reactive; sclera anicteric;no lid lag.  Nose without nasal septal hypertrophy Mouth/Parynx benign; Mallinpatti scale 2 Neck: No JVD, no carotid bruits Lungs: clear to ausculatation and percussion; no wheezing or rales Heart: RRR, s1 s2 normal 8-0/0 systolic murmur compatible with her aortic bioprosthesis.  There was no audible aortic insufficiency. Abdomen: soft, nontender; no hepatosplenomehaly, BS+; abdominal aorta nontender and not dilated by palpation. Pulses 2+ Extremities: no clubbing cyanosis or edema, Homan's sign negative  Neurologic: grossly nonfocal Psychological: Normal affect and mood.  ECG (independently read by me): Sinus bradycardia 54 bpm.  Left bundle branch block with repolarization changes.  November 2016 ECG (independently read by me): Sinus bradycardia 55 bpm with occasional premature atrial complex.  Left bundle-branch block.  February 2016 ECG (independently read by me):  Sinus bradycardia 57 bpm.  Left bundle branch block with repolarization changes..  No ectopy.  December 2015 ECG (independently read by me): Sinus bradycardia 58 bpm.  Mild left atrial enlargement.  Left axis deviation.  Left  bundle branch block with repolarization changes.  October 2014 ECG: Sinus bradycardia with left axis deviation. Left bundle branch block with repolarization changes  LAB BMP Latest Ref Rng 01/16/2015 09/04/2014 09/03/2014  Glucose 65 - 99 mg/dL 84 109(H) 112(H)  BUN 7 - 25 mg/dL '16 15 12  ' Creatinine 0.60 - 0.93 mg/dL 0.98(H) 1.06(H) 1.04(H)  Sodium 135 - 146 mmol/L 139 139 138  Potassium 3.5 -  5.3 mmol/L 4.8 4.0 3.9  Chloride 98 - 110 mmol/L 103 104 105  CO2 20 - 31 mmol/L '30 27 25  ' Calcium 8.6 - 10.4 mg/dL 9.4 8.2(L) 8.0(L)   Hepatic Function Latest Ref Rng 01/16/2015 09/04/2014 08/29/2014  Total Protein 6.1 - 8.1 g/dL 6.4 5.3(L) 6.6  Albumin 3.6 - 5.1 g/dL 3.7 2.8(L) 3.8  AST 10 - 35 U/L '21 15 27  ' ALT 6 - 29 U/L 12 13(L) 18  Alk Phosphatase 33 - 130 U/L 63 54 68  Total Bilirubin 0.2 - 1.2 mg/dL 0.7 0.4 0.5   CBC Latest Ref Rng 01/16/2015 09/04/2014 09/03/2014  WBC 4.0 - 10.5 K/uL 6.9 7.0 8.0  Hemoglobin 12.0 - 15.0 g/dL 15.5(H) 12.2 12.2  Hematocrit 36.0 - 46.0 % 46.5(H) 37.5 37.3  Platelets 150 - 400 K/uL 117(L) 59(L) 56(L)   Lab Results  Component Value Date   MCV 91.2 01/16/2015   MCV 93.5 09/04/2014   MCV 94.0 09/03/2014   Lab Results  Component Value Date   TSH 3.997 01/16/2015   Lipid Panel     Component Value Date/Time   CHOL 134 01/16/2015 1028   TRIG 202* 01/16/2015 1028   HDL 54 01/16/2015 1028   CHOLHDL 2.5 01/16/2015 1028   VLDL 40* 01/16/2015 1028   LDLCALC 40 01/16/2015 1028     RADIOLOGY: No results found.    ASSESSMENT AND PLAN: Ms. Pata is a 75 year old female who has documented CAD, and PVD who developed progressive aortic valve stenosis.   She underwent successful TAVR procedure on 08/31/2014 with placement of a 26 mm Edward Sapien 3 Valve from the right femoral approach.  Presently, she denies any anginal symptoms.  She's had issues with hypertension and has been maintained on 160 mg, valsartan daily, Toprol-XL 25 mg in addition to furosemide 20 mg  as needed for leg swelling.  She has experienced an episode of mild dizziness and one episode of palpitation.  Her repeat blood pressure by me was 132/80.  She is bradycardic on ECG with heart rate at 54.  She's had normal renal function.  I have suggested slight additional reduction in valsartan dose and she will alternate 160 mg with 80 mg every other day.  Will be undergoing repeat laboratory by Dr. Karie Kirks and will be seeing him later this month.  I am also suggesting that she have a follow-up echo Doppler study in July prior to her one-year follow-up evaluation with Dr. Julianne Handler.  If she experiences any recurrent episodes of palpitation.  A monitor will be obtained.  Her lipid studies were excellent with an LDL of 40 on Crestor 20 mg.  I will schedule her one-year follow-up evaluation with Dr. Kinnie Scales in August and I will see her back in the office in February 2018.   Time spent: 25 minutes   Troy Sine, MD, [facility]  05/07/2015 6:29 PM

## 2015-05-15 DIAGNOSIS — E785 Hyperlipidemia, unspecified: Secondary | ICD-10-CM | POA: Diagnosis not present

## 2015-05-15 DIAGNOSIS — M1990 Unspecified osteoarthritis, unspecified site: Secondary | ICD-10-CM | POA: Diagnosis not present

## 2015-05-15 DIAGNOSIS — Z7901 Long term (current) use of anticoagulants: Secondary | ICD-10-CM | POA: Diagnosis not present

## 2015-05-15 DIAGNOSIS — N951 Menopausal and female climacteric states: Secondary | ICD-10-CM | POA: Diagnosis not present

## 2015-05-30 DIAGNOSIS — H35033 Hypertensive retinopathy, bilateral: Secondary | ICD-10-CM | POA: Diagnosis not present

## 2015-06-03 DIAGNOSIS — Z7901 Long term (current) use of anticoagulants: Secondary | ICD-10-CM | POA: Diagnosis not present

## 2015-07-03 DIAGNOSIS — Z7901 Long term (current) use of anticoagulants: Secondary | ICD-10-CM | POA: Diagnosis not present

## 2015-07-08 DIAGNOSIS — Z008 Encounter for other general examination: Secondary | ICD-10-CM | POA: Diagnosis not present

## 2015-08-06 DIAGNOSIS — Z7901 Long term (current) use of anticoagulants: Secondary | ICD-10-CM | POA: Diagnosis not present

## 2015-09-09 ENCOUNTER — Other Ambulatory Visit: Payer: Self-pay

## 2015-09-09 ENCOUNTER — Ambulatory Visit (HOSPITAL_COMMUNITY): Payer: Medicare Other | Attending: Cardiology

## 2015-09-09 DIAGNOSIS — Z7901 Long term (current) use of anticoagulants: Secondary | ICD-10-CM | POA: Diagnosis not present

## 2015-09-09 DIAGNOSIS — Z954 Presence of other heart-valve replacement: Secondary | ICD-10-CM

## 2015-09-09 DIAGNOSIS — Z952 Presence of prosthetic heart valve: Secondary | ICD-10-CM

## 2015-09-09 DIAGNOSIS — Z953 Presence of xenogenic heart valve: Secondary | ICD-10-CM | POA: Diagnosis not present

## 2015-09-09 DIAGNOSIS — I119 Hypertensive heart disease without heart failure: Secondary | ICD-10-CM | POA: Insufficient documentation

## 2015-09-09 DIAGNOSIS — I340 Nonrheumatic mitral (valve) insufficiency: Secondary | ICD-10-CM | POA: Diagnosis not present

## 2015-09-09 DIAGNOSIS — I2510 Atherosclerotic heart disease of native coronary artery without angina pectoris: Secondary | ICD-10-CM | POA: Diagnosis not present

## 2015-09-09 DIAGNOSIS — I10 Essential (primary) hypertension: Secondary | ICD-10-CM

## 2015-09-11 NOTE — Progress Notes (Signed)
Chief Complaint  Patient presents with  . Follow-up    1 year f/u    History of Present Illness: 75 yo female with history of severe aortic valve stenosis, HTN, HLD, CAD, PAD, prior CVA who is here today in the valve clinic for one year follow up after her TAVR procedure on 08/31/14. She is followed by Dr. Claiborne Billings. She underwent TAVR on 08/31/14 with placement of a 26 mm Edwards Sapien 3 valve from the right femoral approach. She developed atrial fibrillation post-op and was started on amiodarone. At her surgical f/u visit on 09/10/14 she was in sinus.  Her amiodarone was stopped.   She tells me today that she feels well. She is having occasional chest pressure. This only lasts for several minutes, occurs at rest. No dyspnea. Lower ext edema late in the day that resolves at night. Echo today shows normal LV function with normally functioning valve. No AI.   Primary Care Physician:  Robert Bellow, MD Primary Cardiologist: Ellouise Newer   Past Medical History  Diagnosis Date  . Hypertension   . Claudication ([place])   . Seroma, postoperative     Left Groin  . Coronary artery disease     a. April 2012 which revealed mid RCA occlusion with collaterals, 50% LAD stenosis, 30% circumflex stenosis, and 40% marginal stenosis  . Peripheral vascular disease ([place])   . Glaucoma     early stage  . Aortic stenosis, severe   . GERD (gastroesophageal reflux disease)   . Childhood asthma   . Chronic bronchitis ([place])        . Arthritis        . Stroke [facility]) 1982 X 2    "a little bit weaker on the left side since" (08/29/2014)  . 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 Aorto-femoral bypass graft Bilateral 06/27/10  . Vaginal hysterectomy  1970's    Partial   . Pr vein bypass graft,aorto-fem-pop  06/12/10  . Cardiac catheterization  N/A 07/25/2014    Procedure: Right/Left Heart Cath and Coronary Angiography;  Surgeon: Troy Sine, MD;  Location: [place] [facility];  Service: Cardiovascular;  Laterality: N/A;  . Colonoscopy    . Multiple tooth extractions    . Dilation and curettage of uterus  "2 or 3"  . Tubal ligation  1970's  . Basal cell carcinoma excision Left     face  . Transcatheter aortic valve replacement, transfemoral N/A 08/31/2014    Procedure: TRANSCATHETER AORTIC VALVE REPLACEMENT, TRANSFEMORAL approach;  Surgeon: Rexene Alberts, MD;  Location: [place];  Service: Open Heart Surgery;  Laterality: N/A;  . Tee without cardioversion N/A 08/31/2014    Procedure: TRANSESOPHAGEAL ECHOCARDIOGRAM (TEE);  Surgeon: Rexene Alberts, MD;  Location: [place];  Service: Open Heart Surgery;  Laterality: N/A;    Current Outpatient Prescriptions  Medication Sig Dispense Refill  . cetirizine (ZYRTEC) 10 MG tablet Take 10 mg by mouth daily.      Marland Kitchen estradiol (ESTRACE) 1 MG tablet 1 mg once.    . furosemide (LASIX) 20 MG tablet Take 20 mg by mouth as needed (fluid retention).     . meloxicam (MOBIC) 15 MG tablet Take 15 mg by mouth daily.      . metoprolol succinate (TOPROL-XL) 25 MG 24 hr tablet Take 25  mg by mouth daily.     . potassium chloride (KLOR-CON) 10 MEQ CR tablet Take 10 mEq by mouth as needed (when taking Lasix).     . rosuvastatin (CRESTOR) 20 MG tablet Take 20 mg by mouth daily.      . valsartan (DIOVAN) 160 MG tablet Take 1 tablet alternating with 1/2 tablet daily 30 tablet 11  . warfarin (COUMADIN) 3 MG tablet Take 3 mg by mouth daily at 6 PM.     . nitroGLYCERIN (NITROSTAT) 0.4 MG SL tablet Place 1 tablet (0.4 mg total) under the tongue every 5 (five) minutes as needed for chest pain. 25 tablet 6   No current facility-administered medications for this visit.    Allergies  Allergen Reactions  . Adhesive [Tape] Rash  . Latex Rash  . Tetanus Toxoids Rash    Social History   Social History  . Marital  Status: Married    Spouse Name: N/A  . Number of Children: N/A  . Years of Education: N/A   Occupational History  . Not on file.   Social History Main Topics  . Smoking status: Former Smoker -- 2.00 packs/day for 56 years    Types: Cigarettes    Quit date: 06/01/2010  . Smokeless tobacco: Never Used     Comment: HAD WEANED DOWN TO 1/4 PK A DAY BEFORE D/C  . Alcohol Use: 0.0 oz/week    0 Standard drinks or equivalent per week     Comment: 08/29/2014 "never drank much; quit in the 1990's"  . Drug Use: No  . Sexual Activity: Yes   Other Topics Concern  . Not on file   Social History Narrative    Family History  Problem Relation Age of Onset  . Heart disease Mother   . Varicose Veins Mother   . Stroke Father   . Hypertension Father   . Heart disease Father     Aneurysm  . Heart attack Father   . Arthritis Brother   . Diabetes Daughter     Review of Systems:  As stated in the HPI and otherwise negative.   BP 128/66 mmHg  Pulse 54  Ht '5\' 10"'$  (1.778 m)  Wt 188 lb (85.276 kg)  BMI 26.98 kg/m2  Physical Examination: General: Well developed, well nourished, NAD HEENT: OP clear, mucus membranes moist SKIN: warm, dry. No rashes. Neuro: No focal deficits Musculoskeletal: Muscle strength 5/5 all ext Psychiatric: Mood and affect normal Neck: No JVD, no carotid bruits, no thyromegaly, no lymphadenopathy. Lungs:Clear bilaterally, no wheezes, rhonci, crackles Cardiovascular: Regular rate and rhythm. Soft systolic murmur noted. No gallops or rubs. Abdomen:Soft. Bowel sounds present. Non-tender.  Extremities: No lower extremity edema. Pulses are 2 + in the bilateral DP/PT.  Echo 09/09/15: Left ventricle: The cavity size was normal. There was moderate  focal basal hypertrophy. Systolic function was normal. The  estimated ejection fraction was in the range of 60% to 65%. Wall  motion was normal; there were no regional wall motion  abnormalities. Features are consistent  with a pseudonormal left  ventricular filling pattern, with concomitant abnormal relaxation  and increased filling pressure (grade 2 diastolic dysfunction).  Doppler parameters are consistent with high ventricular filling  pressure. - Aortic valve: S/P TAVR with 63m Edwards Sapien 3 valve which  appears well seated. The mean AVG is 182mg. There is no  perivavlular leak. - Mitral valve: Calcified annulus. There was mild regurgitation. - Left atrium: The atrium was mildly dilated. - Right ventricle: The  cavity size was mildly dilated. Wall  thickness was normal. - Pulmonary arteries: PA peak pressure: 33 mm Hg (S).   EKG:  EKG is not ordered today. The ekg ordered today demonstrates   Recent Labs: 01/16/2015: ALT 12; BUN 16; Creat 0.98*; Hemoglobin 15.5*; Platelets 117*; Potassium 4.8; Sodium 139; TSH 3.997   Lipid Panel    Component Value Date/Time   CHOL 134 01/16/2015 1028   TRIG 202* 01/16/2015 1028   HDL 54 01/16/2015 1028   CHOLHDL 2.5 01/16/2015 1028   VLDL 40* 01/16/2015 1028   LDLCALC 40 01/16/2015 1028     Wt Readings from Last 3 Encounters:  09/12/15 188 lb (85.276 kg)  05/06/15 183 lb (83.008 kg)  03/11/15 185 lb (83.915 kg)     Other studies Reviewed: Additional studies/ records that were reviewed today include Review of the above records demonstrates:   Assessment and Plan:   1. Severe aortic valve stenosis: She is now one year s/p TAVR on 08/31/14 with 26 mm Sapien 3 valve. She is doing well with mild dyspnea with exertion. (NYHA class 2). Echo today shows normally functioning bioprosthetic valve with no AI. LVEF is unchanged. Continue coumadin.   2. Atrial fibrillation, paroxysmal: Occurred following procedure. Appears to be in sinus today. She is on coumadin.   3. CAD: Rare chest pains. Will refill NTG today.   Current medicines are reviewed at length with the patient today.  The patient does not have concerns regarding medicines.  The  following changes have been made:  no change  Labs/ tests ordered today include:  No orders of the defined types were placed in this encounter.    Disposition:   Follow up with Dr. Claiborne Billings as planned.    Signed, Lauree Chandler, MD 09/12/2015 10:30 AM    [facility] [place], [place], [place]  [address] Phone: [phone]; Fax: [phone]

## 2015-09-12 ENCOUNTER — Ambulatory Visit (INDEPENDENT_AMBULATORY_CARE_PROVIDER_SITE_OTHER): Payer: Medicare Other | Admitting: Cardiovascular Disease

## 2015-09-12 ENCOUNTER — Encounter: Payer: Self-pay | Admitting: Cardiovascular Disease

## 2015-09-12 VITALS — BP 128/66 | HR 54 | Ht 70.0 in | Wt 188.0 lb

## 2015-09-12 DIAGNOSIS — Z952 Presence of prosthetic heart valve: Secondary | ICD-10-CM

## 2015-09-12 DIAGNOSIS — I350 Nonrheumatic aortic (valve) stenosis: Secondary | ICD-10-CM | POA: Diagnosis not present

## 2015-09-12 DIAGNOSIS — I2510 Atherosclerotic heart disease of native coronary artery without angina pectoris: Secondary | ICD-10-CM

## 2015-09-12 DIAGNOSIS — Z954 Presence of other heart-valve replacement: Secondary | ICD-10-CM

## 2015-09-12 DIAGNOSIS — I480 Paroxysmal atrial fibrillation: Secondary | ICD-10-CM

## 2015-09-12 MED ORDER — NITROGLYCERIN 0.4 MG SL SUBL: 0.4000 mg | SUBLINGUAL_TABLET | SUBLINGUAL | Status: DC | PRN

## 2015-09-12 NOTE — Patient Instructions (Addendum)
Medication Instructions:  Your physician recommends that you continue on your current medications as directed. Please refer to the Current Medication list given to you today.   Labwork: none  Testing/Procedures: none  Follow-Up: Your physician recommends that you schedule a follow-up appointment in about 6-8 weeks with Dr. Claiborne Billings    Any Other Special Instructions Will Be Listed Below (If Applicable).     If you need a refill on your cardiac medications before your next appointment, please call your pharmacy.

## 2015-09-30 ENCOUNTER — Telehealth: Payer: Self-pay | Admitting: *Deleted

## 2015-09-30 NOTE — Telephone Encounter (Signed)
Patient notified of results. Voiced understanding.

## 2015-09-30 NOTE — Telephone Encounter (Signed)
-----   Message from Troy Sine, MD sent at 09/24/2015 11:19 PM EDT ----- Nl EF; s/p TAVR; well seated; MAC with mild MR

## 2015-10-09 DIAGNOSIS — Z7901 Long term (current) use of anticoagulants: Secondary | ICD-10-CM | POA: Diagnosis not present

## 2015-11-07 ENCOUNTER — Other Ambulatory Visit (HOSPITAL_COMMUNITY): Payer: Self-pay | Admitting: Cardiovascular Disease

## 2015-11-11 DIAGNOSIS — Z7901 Long term (current) use of anticoagulants: Secondary | ICD-10-CM | POA: Diagnosis not present

## 2015-11-13 DIAGNOSIS — N951 Menopausal and female climacteric states: Secondary | ICD-10-CM | POA: Diagnosis not present

## 2015-11-13 DIAGNOSIS — Z7901 Long term (current) use of anticoagulants: Secondary | ICD-10-CM | POA: Diagnosis not present

## 2015-11-13 DIAGNOSIS — Z954 Presence of other heart-valve replacement: Secondary | ICD-10-CM | POA: Diagnosis not present

## 2015-11-13 DIAGNOSIS — I10 Essential (primary) hypertension: Secondary | ICD-10-CM | POA: Diagnosis not present

## 2015-11-13 DIAGNOSIS — Z23 Encounter for immunization: Secondary | ICD-10-CM | POA: Diagnosis not present

## 2015-12-23 ENCOUNTER — Encounter: Payer: Self-pay | Admitting: Cardiovascular Disease

## 2015-12-23 ENCOUNTER — Ambulatory Visit (INDEPENDENT_AMBULATORY_CARE_PROVIDER_SITE_OTHER): Payer: Medicare Other | Admitting: Cardiovascular Disease

## 2015-12-23 VITALS — BP 149/77 | HR 74 | Ht 69.5 in | Wt 185.8 lb

## 2015-12-23 DIAGNOSIS — I2510 Atherosclerotic heart disease of native coronary artery without angina pectoris: Secondary | ICD-10-CM

## 2015-12-23 DIAGNOSIS — I739 Peripheral vascular disease, unspecified: Secondary | ICD-10-CM

## 2015-12-23 DIAGNOSIS — I4891 Unspecified atrial fibrillation: Secondary | ICD-10-CM | POA: Diagnosis not present

## 2015-12-23 DIAGNOSIS — E785 Hyperlipidemia, unspecified: Secondary | ICD-10-CM

## 2015-12-23 DIAGNOSIS — I10 Essential (primary) hypertension: Secondary | ICD-10-CM

## 2015-12-23 DIAGNOSIS — I493 Ventricular premature depolarization: Secondary | ICD-10-CM

## 2015-12-23 DIAGNOSIS — I70211 Atherosclerosis of native arteries of extremities with intermittent claudication, right leg: Secondary | ICD-10-CM

## 2015-12-23 MED ORDER — VALSARTAN 160 MG PO TABS
ORAL_TABLET | ORAL | 11 refills | Status: DC
Start: 2015-12-23 — End: 2016-01-06

## 2015-12-23 MED ORDER — METOPROLOL SUCCINATE ER 50 MG PO TB24: 50.0000 mg | ORAL_TABLET | Freq: Every day | ORAL | 6 refills | Status: DC

## 2015-12-23 NOTE — Progress Notes (Signed)
Patient ID: TYSHAUNA FINKBINER, female   DOB: 01/06/1941, 75 y.o.   MRN: [id number]     HPI: DENNA FRYBERGER is a 75 y.o. female who presents to the office for a 7 month follow-up cardiology evaluation  Mrs. Keshishyan has known CAD and underwent initial cardiac catheterization in  April 2012 which revealed mid RCA occlusion with collaterals, 50% LAD stenosis, 30% circumflex stenosis, and 40% marginal stenosis. 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 Trula Slade. She has a history of hypertension with grade 2 diastolic dysfunction, hyperlipidemia, and has developed moderately severe aortic valve stenosis. An echo Doppler study in May 2013 showed a mean transvalvular aortic gradient of 34 with a maximum of 56. In December 2013, her mean gradient was 32 and peak 56 with a valve area of 0.84. An echo Doppler study on 12/06/2012 continued to show normal systolic function with an ejection fraction of 60-65%. She had grade 1 diastolic dysfunction. Peak gradients were  61 mm and mean gradient was 38 mm.  The aortic valve area 0.8-0.9 cm. She had mild to moderate aortic insufficiency. Had mildly thickened mitral valve leaflets with mild MR. The left atrium was mildly dilated. She had mild TR. Pulmonary pressure was mildly increased at 33 mm.  A follow-up echo Doppler study on 02/12/2014  Showed normal EF at 55-60%. Her aortic valve was severely calcified and stenosis was felt to be severe with a mean gradient of 42, a peak gradient of 64 which had slightly increaed from 38 and 61 mm, respectively.  She began to develop exertional chest discomfort with associated dyspnea and fatigue.  She underwent repeat cardiac catheterization on 07/25/2014 which showed a chronically occluded RCA with left-to-right collaterals, a 70% diagonal stenosis, 50% ostial circumflex stenosis and 40% AV groove  circumflex stenosis.  Her aortic stenosis at further progressed such that she had a mean gradient of 42 mm.  She was felt to be a poor candidate for conventional aortic valve replacement and on 08/31/2014 underwent successful  TAVR with placement of a 26 mm Edward Sapien 3 valve from the right femoral approach by Drs. Roxy Manns and [place].  She developed postoperative atrial fibrillation for which she was transiently treated with amiodarone and when seen for office follow-up she was back in sinus rhythm.  She has noticed improvement in energy.  She is unaware of recurrent atrial fibrillation.  She denies recurrent chest tightness.  She at times is fatigued.  A subsequent echo on 10/01/2014 showed an EF of 60-65% with a mean aortic gradient of 16 mm and peak gradient 26 with trivial aortic insufficiency.    Since I last saw her, she denies any episodes of chest pain.  She admits to mild shortness of breath with walking.  She is on Toprol-XL 25 mg, and had been on valsartan 160 mg.  Recently, she tells me her primary physician, reduced this to 80 mg.  She is unaware of palpitations.  She is on Coumadin for anticoagulation.  She takes furosemide on an as-needed basis for leg swelling.  She underwent a follow-up echo Doppler study on 09/09/2015.  This showed an EF of 60-65%.  There was grade 2 diastolic dysfunction.  There were no wall motion abnormalities.  Her 26 mm Edward's Sapien 3 valve was well-seated from her TAVR procedure.  The mean valve gradient was 16 mm.  There was no evidence  for perivalvular leak.  There was mitral annular calcification with mild MR, mild LA dilatation and mild dilation of her right ventricle.  There is mild coronary hypertension with a PA pressure 33 mm.  She presents for evaluation.  Past Medical History:  Diagnosis Date  . Aortic stenosis, severe   . Arthritis       . Childhood asthma   . Chronic bronchitis ([place])       . Claudication ([place])   . Coronary artery disease     a. April 2012 which revealed mid RCA occlusion with collaterals, 50% LAD stenosis, 30% circumflex stenosis, and 40% marginal stenosis  . GERD (gastroesophageal reflux disease)   . Glaucoma    early stage  . Hypertension   . Peripheral vascular disease ([place])   . 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 Seroma, postoperative    Left Groin  . Stroke [facility]) 1982 X 2   "a little bit weaker on the left side since" (08/29/2014)    Past Surgical History:  Procedure Laterality Date  . AORTO-FEMORAL BYPASS GRAFT Bilateral 06/27/10  . BASAL CELL CARCINOMA EXCISION Left    face  . CARDIAC CATHETERIZATION N/A 07/25/2014   Procedure: Right/Left Heart Cath and Coronary Angiography;  Surgeon: Troy Sine, MD;  Location: [facility];  Service: Cardiovascular;  Laterality: N/A;  . COLONOSCOPY    . DILATION AND CURETTAGE OF UTERUS  "2 or 3"  . MULTIPLE TOOTH EXTRACTIONS    . PR VEIN BYPASS GRAFT,AORTO-FEM-POP  06/12/10  . TEE WITHOUT CARDIOVERSION N/A 08/31/2014   Procedure: TRANSESOPHAGEAL ECHOCARDIOGRAM (TEE);  Surgeon: Rexene Alberts, MD;  Location: [facility];  Service: Open Heart Surgery;  Laterality: N/A;  . TRANSCATHETER AORTIC VALVE REPLACEMENT, TRANSFEMORAL N/A 08/31/2014   Procedure: TRANSCATHETER AORTIC VALVE REPLACEMENT, TRANSFEMORAL approach;  Surgeon: Rexene Alberts, MD;  Location: [place];  Service: Open Heart Surgery;  Laterality: N/A;  . TUBAL LIGATION  1970's  . VAGINAL HYSTERECTOMY  1970's   Partial     Allergies  Allergen Reactions  . Adhesive [Tape] Rash  . Latex Rash  . Tetanus Toxoids Rash    Current Outpatient Prescriptions  Medication Sig Dispense Refill  . cetirizine (ZYRTEC) 10 MG tablet Take 10 mg by mouth daily.      Marland Kitchen estradiol (ESTRACE) 1 MG tablet 1 mg once.    . furosemide (LASIX) 20 MG tablet Take 20 mg by mouth as needed (fluid retention).       . meloxicam (MOBIC) 15 MG tablet Take 15 mg by mouth daily.      . nitroGLYCERIN (NITROSTAT) 0.4 MG SL tablet Place 1 tablet (0.4 mg total) under the tongue every 5 (five) minutes as needed for chest pain. 25 tablet 6  . potassium chloride (KLOR-CON) 10 MEQ CR tablet Take 10 mEq by mouth as needed (when taking Lasix).     . rosuvastatin (CRESTOR) 20 MG tablet Take 20 mg by mouth daily.      . valsartan (DIOVAN) 160 MG tablet Take 1/2 tablet daily 30 tablet 11  . warfarin (COUMADIN) 3 MG tablet Take 3 mg by mouth daily at 6 PM.     . metoprolol succinate (TOPROL-XL) 50 MG 24 hr tablet Take 1 tablet (50 mg total) by mouth daily. Take with or immediately following a meal. 30 tablet 6   No current facility-administered medications for this  visit.     Social History   Social History  . Marital status: Married    Spouse name: N/A  . Number of children: N/A  . Years of education: N/A   Occupational History  . Not on file.   Social History Main Topics  . Smoking status: Former Smoker    Packs/day: 2.00    Years: 56.00    Types: Cigarettes    Quit date: 06/01/2010  . Smokeless tobacco: Never Used     Comment: HAD WEANED DOWN TO 1/4 PK A DAY BEFORE D/C  . Alcohol use 0.0 oz/week     Comment: 08/29/2014 "never drank much; quit in the 1990's"  . Drug use: No  . Sexual activity: Yes   Other Topics Concern  . Not on file   Social History Narrative  . No narrative on file    Family History  Problem Relation Age of Onset  . Heart disease Mother   . Varicose Veins Mother   . Stroke Father   . Hypertension Father   . Heart disease Father     Aneurysm  . Heart attack Father   . Arthritis Brother   . Diabetes Daughter    Social history is notable that she is married has one child. She quit tobacco in April 2012 after a long-standing tobacco history having started smoking at age 63.   ROS General: Negative; No fevers, chills, or night sweats;  HEENT: Negative; No changes in  vision or hearing, sinus congestion, difficulty swallowing Pulmonary: Negative; No cough, wheezing, shortness of breath, hemoptysis Cardiovascular: See history of present illness.   GI: Negative; No nausea, vomiting, diarrhea, or abdominal pain GU: Negative; No dysuria, hematuria, or difficulty voiding Musculoskeletal: Negative; no myalgias, joint pain, or weakness Hematologic/Oncology: Negative; no easy bruising, bleeding Endocrine: Negative; no heat/cold intolerance; no diabetes Neuro: Negative; no changes in balance, headaches Skin: Negative; No rashes or skin lesions Psychiatric: Negative; No behavioral problems, depression Sleep: Negative; No snoring, daytime sleepiness, hypersomnolence, bruxism, restless legs, hypnogognic hallucinations, no cataplexy Other comprehensive 14 point system review is negative.   PE BP (!) 149/77   Pulse 74   Ht 5' 9.5" (1.765 m)   Wt 185 lb 12.8 oz (84.3 kg)   BMI 27.04 kg/m    Repeat blood pressure by me 130/78  Wt Readings from Last 3 Encounters:  12/23/15 185 lb 12.8 oz (84.3 kg)  09/12/15 188 lb (85.3 kg)  05/06/15 183 lb (83 kg)   General: Alert, oriented, no distress.  Skin: normal turgor, no rashes HEENT: Normocephalic, atraumatic. Pupils round and reactive; sclera anicteric;no lid lag.  Nose without nasal septal hypertrophy Mouth/Parynx benign; Mallinpatti scale 2 Neck: No JVD, no carotid bruits Lungs: clear to ausculatation and percussion; no wheezing or rales Heart: Regular rate with an occasional ectopic complex., s1 s2 normal 6-0/6 systolic murmur compatible with her aortic bioprosthesis.  There was no audible aortic insufficiency. Abdomen: soft, nontender; no hepatosplenomehaly, BS+; abdominal aorta nontender and not dilated by palpation. Pulses 2+ Extremities: no clubbing cyanosis or edema, Homan's sign negative  Neurologic: grossly nonfocal Psychological: Normal affect and mood.  ECG (independently read by me): Sinus rhythm  at 74 bpm with frequent PVCs in a trigeminal pattern.  March 2017 ECG (independently read by me): Sinus bradycardia 54 bpm.  Left bundle branch block with repolarization changes.  November 2016 ECG (independently read by me): Sinus bradycardia 55 bpm with occasional premature atrial complex.  Left bundle-branch block.  February 2016 ECG (  independently read by me):  Sinus bradycardia 57 bpm.  Left bundle branch block with repolarization changes..  No ectopy.  December 2015 ECG (independently read by me): Sinus bradycardia 58 bpm.  Mild left atrial enlargement.  Left axis deviation.  Left bundle branch block with repolarization changes.  October 2014 ECG: Sinus bradycardia with left axis deviation. Left bundle branch block with repolarization changes  LAB BMP Latest Ref Rng & Units 01/16/2015 09/04/2014 09/03/2014  Glucose 65 - 99 mg/dL 84 109(H) 112(H)  BUN 7 - 25 mg/dL _0 Creatinine 0.60 - 0.93 mg/dL 0.98(H) 1.06(H) 1.04(H)  Sodium 135 - 146 mmol/L 139 139 138  Potassium 3.5 - 5.3 mmol/L 4.8 4.0 3.9  Chloride 98 - 110 mmol/L 103 104 105  CO2 20 - 31 mmol/L _1 Calcium 8.6 - 10.4 mg/dL 9.4 8.2(L) 8.0(L)   Hepatic Function Latest Ref Rng & Units 01/16/2015 09/04/2014 08/29/2014  Total Protein 6.1 - 8.1 g/dL 6.4 5.3(L) 6.6  Albumin 3.6 - 5.1 g/dL 3.7 2.8(L) 3.8  AST 10 - 35 U/L _2 ALT 6 - 29 U/L 12 13(L) 18  Alk Phosphatase 33 - 130 U/L 63 54 68  Total Bilirubin 0.2 - 1.2 mg/dL 0.7 0.4 0.5   CBC Latest Ref Rng & Units 01/16/2015 09/04/2014 09/03/2014  WBC 4.0 - 10.5 K/uL 6.9 7.0 8.0  Hemoglobin 12.0 - 15.0 g/dL 15.5(H) 12.2 12.2  Hematocrit 36.0 - 46.0 % 46.5(H) 37.5 37.3  Platelets 150 - 400 K/uL 117(L) 59(L) 56(L)   Lab Results  Component Value Date   MCV 91.2 01/16/2015   MCV 93.5 09/04/2014   MCV 94.0 09/03/2014   Lab Results  Component Value Date   TSH 3.997 01/16/2015   Lipid Panel     Component Value Date/Time   CHOL 134 01/16/2015 1028   TRIG 202 (H)  01/16/2015 1028   HDL 54 01/16/2015 1028   CHOLHDL 2.5 01/16/2015 1028   VLDL 40 (H) 01/16/2015 1028   LDLCALC 40 01/16/2015 1028     RADIOLOGY: No results found.    ASSESSMENT AND PLAN: Ms. Cassity is a 75 year old female who has documented CAD, and PVD who developed progressive aortic valve stenosis and underwent successful TAVR procedure on 08/31/2014 with placement of a 26 mm Edward Sapien 3 Valve from the right femoral approach.  Presently, she denies any anginal symptoms.  She had issues with hypertension and had been maintained on 160 mg, valsartan daily, Toprol-XL 25 mg in addition to furosemide 20 mg as needed for leg swelling.  Recently, she tells me that Dr. Karie Kirks was concerned that her blood pressure was dropping when she was standing and he reduced her valsartan dose to 80 mg.  On exam today she has occasional to frequent palpitations and has documented trigeminy on ECG.  With her being on the reduced valsartan dose, I recommended further titration of her beta blocker and she will increase Toprol to 50 mg daily.  I reviewed her most recent echo Doppler study, which shows a well-seated aortic valve replacement with no signs of insufficiency and a mean gradient, consistent with her valve.  She continues to be on rosuvastatin 20 mg for hyperlipidemia.  She denies myalgias.  I am recommending complete set of fasting laboratory be obtained including a CMP, magnesium, TSH, CBC, and lipid panel.  Adjustments to her medications will be made if needed.  I will see her in 2-3 months for reevaluation.   Time spent:  25 minutes   Troy Sine, MD, [facility]  12/23/2015 6:54 PM

## 2015-12-23 NOTE — Patient Instructions (Addendum)
Your physician has recommended you make the following change in your medication:   1.) the metoprolol has been increased to 50 mg daily. A new 50 mg prescription has been sent to your pharmacy. You can take (2) of the 25 mg tablets until completed, then start the new 50 mg tablet.  Your physician recommends that you return for lab work FASTING.  Your physician recommends that you schedule a follow-up appointment in: 2-3  MONTHS

## 2015-12-25 ENCOUNTER — Other Ambulatory Visit: Payer: Self-pay | Admitting: Cardiovascular Disease

## 2015-12-25 DIAGNOSIS — I70211 Atherosclerosis of native arteries of extremities with intermittent claudication, right leg: Secondary | ICD-10-CM | POA: Diagnosis not present

## 2015-12-25 DIAGNOSIS — E785 Hyperlipidemia, unspecified: Secondary | ICD-10-CM | POA: Diagnosis not present

## 2015-12-25 DIAGNOSIS — I4891 Unspecified atrial fibrillation: Secondary | ICD-10-CM | POA: Diagnosis not present

## 2015-12-25 DIAGNOSIS — I10 Essential (primary) hypertension: Secondary | ICD-10-CM | POA: Diagnosis not present

## 2015-12-26 LAB — COMPREHENSIVE METABOLIC PANEL
ALBUMIN: 4.2 g/dL (ref 3.5–4.8)
ALT: 24 IU/L (ref 0–32)
AST: 29 IU/L (ref 0–40)
Albumin/Globulin Ratio: 1.9 (ref 1.2–2.2)
Alkaline Phosphatase: 95 IU/L (ref 39–117)
BILIRUBIN TOTAL: 0.5 mg/dL (ref 0.0–1.2)
BUN/Creatinine Ratio: 21 (ref 12–28)
BUN: 21 mg/dL (ref 8–27)
CHLORIDE: 108 mmol/L — AB (ref 96–106)
CO2: 24 mmol/L (ref 18–29)
Calcium: 8.9 mg/dL (ref 8.7–10.3)
Creatinine, Ser: 1.01 mg/dL — ABNORMAL HIGH (ref 0.57–1.00)
GFR calc Af Amer: 64 mL/min/{1.73_m2} (ref >59)
GFR calc non Af Amer: 55 mL/min/{1.73_m2} — ABNORMAL LOW (ref >59)
GLOBULIN, TOTAL: 2.2 g/dL (ref 1.5–4.5)
GLUCOSE: 98 mg/dL (ref 65–99)
Potassium: 4.6 mmol/L (ref 3.5–5.2)
SODIUM: 145 mmol/L — AB (ref 134–144)
Total Protein: 6.4 g/dL (ref 6.0–8.5)

## 2015-12-26 LAB — MAGNESIUM: Magnesium: 1.9 mg/dL (ref 1.6–2.3)

## 2015-12-26 LAB — LIPID PANEL
Chol/HDL Ratio: 2.6 ratio units (ref 0.0–4.4)
Cholesterol, Total: 118 mg/dL (ref 100–199)
HDL: 45 mg/dL (ref >39)
LDL Calculated: 44 mg/dL (ref 0–99)
Triglycerides: 144 mg/dL (ref 0–149)
VLDL CHOLESTEROL CAL: 29 mg/dL (ref 5–40)

## 2015-12-26 LAB — CBC
Hematocrit: 45.8 % (ref 34.0–46.6)
Hemoglobin: 15 g/dL (ref 11.1–15.9)
MCH: 30.7 pg (ref 26.6–33.0)
MCHC: 32.8 g/dL (ref 31.5–35.7)
MCV: 94 fL (ref 79–97)
Platelets: 129 10*3/uL — ABNORMAL LOW (ref 150–379)
RBC: 4.88 x10E6/uL (ref 3.77–5.28)
RDW: 14.8 % (ref 12.3–15.4)
WBC: 6.4 10*3/uL (ref 3.4–10.8)

## 2015-12-26 LAB — TSH: TSH: 3.3 u[IU]/mL (ref 0.450–4.500)

## 2015-12-30 DIAGNOSIS — I10 Essential (primary) hypertension: Secondary | ICD-10-CM | POA: Diagnosis not present

## 2015-12-30 DIAGNOSIS — N951 Menopausal and female climacteric states: Secondary | ICD-10-CM | POA: Diagnosis not present

## 2016-01-06 ENCOUNTER — Other Ambulatory Visit (HOSPITAL_COMMUNITY): Payer: Self-pay | Admitting: Family Medicine

## 2016-01-06 ENCOUNTER — Other Ambulatory Visit: Payer: Self-pay | Admitting: Cardiovascular Disease

## 2016-01-06 DIAGNOSIS — Z1231 Encounter for screening mammogram for malignant neoplasm of breast: Secondary | ICD-10-CM

## 2016-01-06 NOTE — Telephone Encounter (Signed)
Rx request sent to pharmacy.  

## 2016-01-15 DIAGNOSIS — Z7901 Long term (current) use of anticoagulants: Secondary | ICD-10-CM | POA: Diagnosis not present

## 2016-01-22 ENCOUNTER — Ambulatory Visit (HOSPITAL_COMMUNITY)
Admission: RE | Admit: 2016-01-22 | Discharge: 2016-01-22 | Disposition: A | Discharge status: Home / Self Care | Payer: Medicare Other | Source: Ambulatory Visit | Attending: Family Medicine | Admitting: Family Medicine

## 2016-01-22 DIAGNOSIS — Z1231 Encounter for screening mammogram for malignant neoplasm of breast: Secondary | ICD-10-CM | POA: Diagnosis not present

## 2016-02-12 ENCOUNTER — Ambulatory Visit (INDEPENDENT_AMBULATORY_CARE_PROVIDER_SITE_OTHER): Payer: Medicare Other | Admitting: Cardiovascular Disease

## 2016-02-12 ENCOUNTER — Encounter: Payer: Self-pay | Admitting: Cardiovascular Disease

## 2016-02-12 VITALS — BP 190/85 | HR 57 | Ht 69.5 in | Wt 187.8 lb

## 2016-02-12 DIAGNOSIS — I4891 Unspecified atrial fibrillation: Secondary | ICD-10-CM | POA: Diagnosis not present

## 2016-02-12 DIAGNOSIS — I2510 Atherosclerotic heart disease of native coronary artery without angina pectoris: Secondary | ICD-10-CM | POA: Diagnosis not present

## 2016-02-12 DIAGNOSIS — E785 Hyperlipidemia, unspecified: Secondary | ICD-10-CM

## 2016-02-12 DIAGNOSIS — I480 Paroxysmal atrial fibrillation: Secondary | ICD-10-CM

## 2016-02-12 DIAGNOSIS — Z7901 Long term (current) use of anticoagulants: Secondary | ICD-10-CM

## 2016-02-12 DIAGNOSIS — I739 Peripheral vascular disease, unspecified: Secondary | ICD-10-CM

## 2016-02-12 DIAGNOSIS — I10 Essential (primary) hypertension: Secondary | ICD-10-CM | POA: Diagnosis not present

## 2016-02-12 DIAGNOSIS — Z952 Presence of prosthetic heart valve: Secondary | ICD-10-CM

## 2016-02-12 DIAGNOSIS — I70211 Atherosclerosis of native arteries of extremities with intermittent claudication, right leg: Secondary | ICD-10-CM | POA: Diagnosis not present

## 2016-02-12 MED ORDER — AMLODIPINE BESYLATE 5 MG PO TABS: 5.0000 mg | ORAL_TABLET | Freq: Every day | ORAL | 6 refills | Status: DC

## 2016-02-12 NOTE — Patient Instructions (Signed)
Your physician has recommended you make the following change in your medication:   1.) start new amlodipine 5 mg prescription. This has been sent to your pharmacy.  Your physician recommends that you schedule a follow-up appointment and bloodwork in 6 weeks.

## 2016-02-13 DIAGNOSIS — I480 Paroxysmal atrial fibrillation: Secondary | ICD-10-CM | POA: Insufficient documentation

## 2016-02-13 NOTE — Progress Notes (Signed)
Patient ID: LILLYN WIECZOREK, female   DOB: 1940-07-07, 75 y.o.   MRN: [id number]    PCP: Dr. Newt Minion HPI: DEKAYLA PRESTRIDGE is a 75 y.o. female who presents to the office for a 2 month follow-up cardiology evaluation  Mrs. Iseminger has known CAD and underwent initial cardiac catheterization in  April 2012 which revealed mid RCA occlusion with collaterals, 50% LAD stenosis, 30% circumflex stenosis, and 40% marginal stenosis. 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 Trula Slade. She has a history of hypertension with grade 2 diastolic dysfunction, hyperlipidemia, and has developed moderately severe aortic valve stenosis. An echo Doppler study in May 2013 showed a mean transvalvular aortic gradient of 34 with a maximum of 56. In December 2013, her mean gradient was 32 and peak 56 with a valve area of 0.84. An echo Doppler study on 12/06/2012 continued to show normal systolic function with an ejection fraction of 60-65%. She had grade 1 diastolic dysfunction. Peak gradients were  61 mm and mean gradient was 38 mm.  The aortic valve area 0.8-0.9 cm. She had mild to moderate aortic insufficiency. Had mildly thickened mitral valve leaflets with mild MR. The left atrium was mildly dilated. She had mild TR. Pulmonary pressure was mildly increased at 33 mm.  A follow-up echo Doppler study on 02/12/2014  Showed normal EF at 55-60%. Her aortic valve was severely calcified and stenosis was felt to be severe with a mean gradient of 42, a peak gradient of 64 which had slightly increaed from 38 and 61 mm, respectively.  She began to develop exertional chest discomfort with associated dyspnea and fatigue.  She underwent repeat cardiac catheterization on 07/25/2014 which showed a chronically occluded RCA with left-to-right collaterals, a 70% diagonal stenosis, 50% ostial circumflex  stenosis and 40% AV groove circumflex stenosis.  Her aortic stenosis at further progressed such that she had a mean gradient of 42 mm.  She was felt to be a poor candidate for conventional aortic valve replacement and on 08/31/2014 underwent successful  TAVR with placement of a 26 mm Edward Sapien 3 valve from the right femoral approach by Drs. Roxy Manns and Rowena.  She developed postoperative atrial fibrillation for which she was transiently treated with amiodarone and when seen for office follow-up she was back in sinus rhythm.  She has noticed improvement in energy.  She is unaware of recurrent atrial fibrillation.  She denies recurrent chest tightness.  She at times is fatigued.  A subsequent echo on 10/01/2014 showed an EF of 60-65% with a mean aortic gradient of 16 mm and peak gradient 26 with trivial aortic insufficiency.    She underwent a follow-up echo Doppler study on 09/09/2015.  This showed an EF of 60-65%.  There was grade 2 diastolic dysfunction.  There were no wall motion abnormalities.  Her 26 mm Edward's Sapien 3 valve was well-seated from her TAVR procedure.  The mean valve gradient was 16 mm.  There was no evidence for perivalvular leak.  There was mitral annular calcification with mild MR, mild LA dilatation and mild dilation of her right ventricle.  There is mild coronary hypertension with a PA pressure 33 mm.    When I last saw her, her dose of valsartan had been reduced prior to the office visit by Dr. Karie Kirks.  At that time, she was having more frequent palpitations and had trigeminy on  her ECG.  With her being on reduce valsartan.  I recommended further titration of her beta blocker and increased her Toprol to 50 mg daily.  Improved on this regimen and is unaware of any recurrent palpitations or skipped beats.  She denies any orthostatic symptoms.  She denies PND or orthopnea.  She presents for evaluation.  Past Medical History:  Diagnosis Date  . Aortic stenosis, severe   .  Arthritis       . Childhood asthma   . Chronic bronchitis ([place])       . Claudication ([place])   . Coronary artery disease    a. April 2012 which revealed mid RCA occlusion with collaterals, 50% LAD stenosis, 30% circumflex stenosis, and 40% marginal stenosis  . GERD (gastroesophageal reflux disease)   . Glaucoma    early stage  . Hypertension   . Peripheral vascular disease ([place])   . 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 Seroma, postoperative    Left Groin  . Stroke [facility]) 1982 X 2   "a little bit weaker on the left side since" (08/29/2014)    Past Surgical History:  Procedure Laterality Date  . AORTO-FEMORAL BYPASS GRAFT Bilateral 06/27/10  . BASAL CELL CARCINOMA EXCISION Left    face  . CARDIAC CATHETERIZATION N/A 07/25/2014   Procedure: Right/Left Heart Cath and Coronary Angiography;  Surgeon: Troy Sine, MD;  Location: [place] [facility];  Service: Cardiovascular;  Laterality: N/A;  . COLONOSCOPY    . DILATION AND CURETTAGE OF UTERUS  "2 or 3"  . MULTIPLE TOOTH EXTRACTIONS    . PR VEIN BYPASS GRAFT,AORTO-FEM-POP  06/12/10  . TEE WITHOUT CARDIOVERSION N/A 08/31/2014   Procedure: TRANSESOPHAGEAL ECHOCARDIOGRAM (TEE);  Surgeon: Rexene Alberts, MD;  Location: [place];  Service: Open Heart Surgery;  Laterality: N/A;  . TRANSCATHETER AORTIC VALVE REPLACEMENT, TRANSFEMORAL N/A 08/31/2014   Procedure: TRANSCATHETER AORTIC VALVE REPLACEMENT, TRANSFEMORAL approach;  Surgeon: Rexene Alberts, MD;  Location: [place];  Service: Open Heart Surgery;  Laterality: N/A;  . TUBAL LIGATION  1970's  . VAGINAL HYSTERECTOMY  1970's   Partial     Allergies  Allergen Reactions  . Adhesive [Tape] Rash  . Latex Rash  . Tetanus Toxoids Rash    Current Outpatient Prescriptions  Medication Sig Dispense Refill  . cetirizine (ZYRTEC) 10 MG tablet Take 10 mg by mouth daily.      Marland Kitchen  estradiol (ESTRACE) 1 MG tablet 1 mg once.    . furosemide (LASIX) 20 MG tablet Take 20 mg by mouth as needed (fluid retention).     . meloxicam (MOBIC) 15 MG tablet Take 15 mg by mouth daily.      . metoprolol succinate (TOPROL-XL) 50 MG 24 hr tablet Take 1 tablet (50 mg total) by mouth daily. Take with or immediately following a meal. 30 tablet 6  . nitroGLYCERIN (NITROSTAT) 0.4 MG SL tablet Place 1 tablet (0.4 mg total) under the tongue every 5 (five) minutes as needed for chest pain. 25 tablet 6  . potassium chloride (KLOR-CON) 10 MEQ CR tablet Take 10 mEq by mouth as needed (when taking Lasix).     . rosuvastatin (CRESTOR) 20 MG tablet Take 20 mg by mouth daily.      Marland Kitchen warfarin (COUMADIN) 3 MG tablet Take 3 mg by mouth daily at 6 PM.     . amLODipine (NORVASC)  5 MG tablet Take 1 tablet (5 mg total) by mouth daily. 30 tablet 6   No current facility-administered medications for this visit.     Social History   Social History  . Marital status: Married    Spouse name: N/A  . Number of children: N/A  . Years of education: N/A   Occupational History  . Not on file.   Social History Main Topics  . Smoking status: Former Smoker    Packs/day: 2.00    Years: 56.00    Types: Cigarettes    Quit date: 06/01/2010  . Smokeless tobacco: Never Used     Comment: HAD WEANED DOWN TO 1/4 PK A DAY BEFORE D/C  . Alcohol use 0.0 oz/week     Comment: 08/29/2014 "never drank much; quit in the 1990's"  . Drug use: No  . Sexual activity: Yes   Other Topics Concern  . Not on file   Social History Narrative  . No narrative on file    Family History  Problem Relation Age of Onset  . Heart disease Mother   . Varicose Veins Mother   . Stroke Father   . Hypertension Father   . Heart disease Father     Aneurysm  . Heart attack Father   . Arthritis Brother   . Diabetes Daughter    Social history is notable that she is married has one child. She quit tobacco in April 2012 after a long-standing  tobacco history having started smoking at age 78.   ROS General: Negative; No fevers, chills, or night sweats;  HEENT: Negative; No changes in vision or hearing, sinus congestion, difficulty swallowing Pulmonary: Negative; No cough, wheezing, shortness of breath, hemoptysis Cardiovascular: See history of present illness.   GI: Negative; No nausea, vomiting, diarrhea, or abdominal pain GU: Negative; No dysuria, hematuria, or difficulty voiding Musculoskeletal: Negative; no myalgias, joint pain, or weakness Hematologic/Oncology: Negative; no easy bruising, bleeding Endocrine: Negative; no heat/cold intolerance; no diabetes Neuro: Negative; no changes in balance, headaches Skin: Negative; No rashes or skin lesions Psychiatric: Negative; No behavioral problems, depression Sleep: Negative; No snoring, daytime sleepiness, hypersomnolence, bruxism, restless legs, hypnogognic hallucinations, no cataplexy Other comprehensive 14 point system review is negative.   PE BP (!) 190/85   Pulse (!) 57   Ht 5' 9.5" (1.765 m)   Wt 187 lb 12.8 oz (85.2 kg)   BMI 27.34 kg/m    Repeat blood pressure by me 180/80.  Wt Readings from Last 3 Encounters:  02/12/16 187 lb 12.8 oz (85.2 kg)  12/23/15 185 lb 12.8 oz (84.3 kg)  09/12/15 188 lb (85.3 kg)   General: Alert, oriented, no distress.  Skin: normal turgor, no rashes HEENT: Normocephalic, atraumatic. Pupils round and reactive; sclera anicteric;no lid lag.  Nose without nasal septal hypertrophy Mouth/Parynx benign; Mallinpatti scale 2 Neck: No JVD, no carotid bruits Lungs: clear to ausculatation and percussion; no wheezing or rales Heart: Regular rate with an occasional ectopic complex., s1 s2 normal 3-0/1 systolic murmur compatible with her aortic bioprosthesis.  There was no audible aortic insufficiency. Abdomen: soft, nontender; no hepatosplenomehaly, BS+; abdominal aorta nontender and not dilated by palpation. Pulses 2+ Extremities: no  clubbing cyanosis or edema, Homan's sign negative  Neurologic: grossly nonfocal Psychological: Normal affect and mood.  ECG (independently read by me): Sinus bradycardia 57 bpm.  Left bundle branch block with repolarization changes.  October 2017 ECG (independently read by me): Sinus rhythm at 74 bpm with frequent PVCs in a trigeminal  pattern.  March 2017 ECG (independently read by me): Sinus bradycardia 54 bpm.  Left bundle branch block with repolarization changes.  November 2016 ECG (independently read by me): Sinus bradycardia 55 bpm with occasional premature atrial complex.  Left bundle-branch block.  February 2016 ECG (independently read by me):  Sinus bradycardia 57 bpm.  Left bundle branch block with repolarization changes..  No ectopy.  December 2015 ECG (independently read by me): Sinus bradycardia 58 bpm.  Mild left atrial enlargement.  Left axis deviation.  Left bundle branch block with repolarization changes.  October 2014 ECG: Sinus bradycardia with left axis deviation. Left bundle branch block with repolarization changes  LAB BMP Latest Ref Rng & Units 12/25/2015 01/16/2015 09/04/2014  Glucose 65 - 99 mg/dL 98 84 109(H)  BUN 8 - 27 mg/dL '21 16 15  ' Creatinine 0.57 - 1.00 mg/dL 1.01(H) 0.98(H) 1.06(H)  BUN/Creat Ratio 12 - 28 21 - -  Sodium 134 - 144 mmol/L 145(H) 139 139  Potassium 3.5 - 5.2 mmol/L 4.6 4.8 4.0  Chloride 96 - 106 mmol/L 108(H) 103 104  CO2 18 - 29 mmol/L '24 30 27  ' Calcium 8.7 - 10.3 mg/dL 8.9 9.4 8.2(L)   Hepatic Function Latest Ref Rng & Units 12/25/2015 01/16/2015 09/04/2014  Total Protein 6.0 - 8.5 g/dL 6.4 6.4 5.3(L)  Albumin 3.5 - 4.8 g/dL 4.2 3.7 2.8(L)  AST 0 - 40 IU/L '29 21 15  ' ALT 0 - 32 IU/L 24 12 13(L)  Alk Phosphatase 39 - 117 IU/L 95 63 54  Total Bilirubin 0.0 - 1.2 mg/dL 0.5 0.7 0.4   CBC Latest Ref Rng & Units 12/25/2015 01/16/2015 09/04/2014  WBC 3.4 - 10.8 x10E3/uL 6.4 6.9 7.0  Hemoglobin 12.0 - 15.0 g/dL - 15.5(H) 12.2  Hematocrit 34.0  - 46.6 % 45.8 46.5(H) 37.5  Platelets 150 - 379 x10E3/uL 129(L) 117(L) 59(L)   Lab Results  Component Value Date   MCV 94 12/25/2015   MCV 91.2 01/16/2015   MCV 93.5 09/04/2014   Lab Results  Component Value Date   TSH 3.300 12/25/2015   Lipid Panel     Component Value Date/Time   CHOL 118 12/25/2015 0909   TRIG 144 12/25/2015 0909   HDL 45 12/25/2015 0909   CHOLHDL 2.6 12/25/2015 0909   CHOLHDL 2.5 01/16/2015 1028   VLDL 40 (H) 01/16/2015 1028   LDLCALC 44 12/25/2015 0909     RADIOLOGY: No results found.  IMPRESSION:  1. Atherosclerosis of native artery of right lower extremity with intermittent claudication ([place])   2. Essential hypertension, benign   3. CAD in native artery   4. Atrial fibrillation with RVR ([place])   5. Hyperlipidemia with target LDL less than 70   6. Peripheral vascular disease ([place])   7. S/P TAVR (transcatheter aortic valve replacement)   8. Paroxysmal atrial fibrillation (H[facility])   9. Anticoagulation adequate      ASSESSMENT AND PLAN: Ms. Worthington is a 75 year old female who has documented CAD, and PVD who developed progressive aortic valve stenosis and underwent successful TAVR procedure on 08/31/2014 with placement of a 26 mm Edward Sapien 3 Valve from the right femoral approach.  Presently, she denies any anginal symptoms.  She had issues with hypertension and had been maintained on 160 mg, valsartan daily, Toprol-XL 25 mg in addition to furosemide 20 mg as needed for leg swelling.  Several months ago, Dr. Karie Kirks was concerned that her blood pressure was dropping when she was standing and he reduced her  valsartan dose to 80 mg.  When I last saw her, she was having frequent ectopy and I further titrated her Toprol-XL to 50 mg daily.  Her blood pressure today is significantly elevated.  Her resting pulse is 57.  She is no longer taking her valsartan.  I am adding amlodipine 5 mg for more optimal blood pressure control.  Reviewed laboratory from October.   Her creatinine at that time was 1.01.  In November.  Lipid studies were excellent with a total cholesterol of 118, LDL cholesterol 44, triglycerides 144 and HDL 45, on Crestor 20 mg.  She continues to be on warfarin for anticoagulation.  She will undergo a follow-up chemistry profile and CBC.  I will see her in 6 weeks for reevaluation.    Time spent: 25 minutes   Troy Sine, MD, [facility]  02/13/2016 3:25 PM

## 2016-02-19 DIAGNOSIS — Z7901 Long term (current) use of anticoagulants: Secondary | ICD-10-CM | POA: Diagnosis not present

## 2016-03-12 ENCOUNTER — Encounter: Payer: Self-pay | Admitting: Family

## 2016-03-13 DIAGNOSIS — I4891 Unspecified atrial fibrillation: Secondary | ICD-10-CM | POA: Diagnosis not present

## 2016-03-13 DIAGNOSIS — I70211 Atherosclerosis of native arteries of extremities with intermittent claudication, right leg: Secondary | ICD-10-CM | POA: Diagnosis not present

## 2016-03-13 LAB — COMPREHENSIVE METABOLIC PANEL
ALBUMIN: 3.8 g/dL (ref 3.6–5.1)
ALT: 13 U/L (ref 6–29)
AST: 19 U/L (ref 10–35)
Alkaline Phosphatase: 76 U/L (ref 33–130)
BUN: 18 mg/dL (ref 7–25)
CALCIUM: 8.4 mg/dL — AB (ref 8.6–10.4)
CHLORIDE: 106 mmol/L (ref 98–110)
CO2: 25 mmol/L (ref 20–31)
Creat: 1.06 mg/dL — ABNORMAL HIGH (ref 0.60–0.93)
Glucose, Bld: 88 mg/dL (ref 65–99)
POTASSIUM: 4.3 mmol/L (ref 3.5–5.3)
Sodium: 140 mmol/L (ref 135–146)
TOTAL PROTEIN: 6.2 g/dL (ref 6.1–8.1)
Total Bilirubin: 0.4 mg/dL (ref 0.2–1.2)

## 2016-03-13 LAB — CBC
HEMATOCRIT: 45.2 % — AB (ref 35.0–45.0)
HEMOGLOBIN: 14.8 g/dL (ref 11.7–15.5)
MCH: 30.6 pg (ref 27.0–33.0)
MCHC: 32.7 g/dL (ref 32.0–36.0)
MCV: 93.6 fL (ref 80.0–100.0)
MPV: 11 fL (ref 7.5–12.5)
Platelets: 135 10*3/uL — ABNORMAL LOW (ref 140–400)
RBC: 4.83 MIL/uL (ref 3.80–5.10)
RDW: 14.3 % (ref 11.0–15.0)
WBC: 5.6 10*3/uL (ref 3.8–10.8)

## 2016-03-16 ENCOUNTER — Encounter: Payer: Self-pay | Admitting: Family

## 2016-03-16 ENCOUNTER — Ambulatory Visit (INDEPENDENT_AMBULATORY_CARE_PROVIDER_SITE_OTHER): Payer: Medicare Other | Admitting: Family

## 2016-03-16 ENCOUNTER — Ambulatory Visit (HOSPITAL_COMMUNITY)
Admission: RE | Admit: 2016-03-16 | Discharge: 2016-03-16 | Disposition: A | Discharge status: Home / Self Care | Payer: Medicare Other | Source: Ambulatory Visit | Attending: Family | Admitting: Family

## 2016-03-16 VITALS — BP 126/70 | HR 58 | Temp 97.5°F | Resp 16 | Ht 69.5 in | Wt 187.0 lb

## 2016-03-16 DIAGNOSIS — I10 Essential (primary) hypertension: Secondary | ICD-10-CM | POA: Diagnosis not present

## 2016-03-16 DIAGNOSIS — Z8679 Personal history of other diseases of the circulatory system: Secondary | ICD-10-CM | POA: Diagnosis not present

## 2016-03-16 DIAGNOSIS — R938 Abnormal findings on diagnostic imaging of other specified body structures: Secondary | ICD-10-CM | POA: Insufficient documentation

## 2016-03-16 DIAGNOSIS — Z95828 Presence of other vascular implants and grafts: Secondary | ICD-10-CM

## 2016-03-16 DIAGNOSIS — I7409 Other arterial embolism and thrombosis of abdominal aorta: Secondary | ICD-10-CM

## 2016-03-16 DIAGNOSIS — E785 Hyperlipidemia, unspecified: Secondary | ICD-10-CM | POA: Diagnosis not present

## 2016-03-16 DIAGNOSIS — R0989 Other specified symptoms and signs involving the circulatory and respiratory systems: Secondary | ICD-10-CM | POA: Diagnosis present

## 2016-03-16 DIAGNOSIS — Z48812 Encounter for surgical aftercare following surgery on the circulatory system: Secondary | ICD-10-CM

## 2016-03-16 DIAGNOSIS — Z87891 Personal history of nicotine dependence: Secondary | ICD-10-CM

## 2016-03-16 DIAGNOSIS — Z4889 Encounter for other specified surgical aftercare: Secondary | ICD-10-CM

## 2016-03-16 NOTE — Progress Notes (Signed)
[facility] [place]   CC: Follow up peripheral artery occlusive disease  History of Present Illness Katelyn [name] is a 76 y.o. female patient of Dr.Brabham who is status post aortobifemoral bypass graft in April of 2012. This was done for bilateral claudication. Postoperatively, she had issues with swelling.  She no longer has swelling in her lower legs since she stopped amlodipine, but she still wears compression stockings.   She still has numbness from both groins to her toes as constant and mild.  She does have a history of back issues. Dr. Vertell Limber performed injections without benefit to her numbness.  She denies claudication symptoms, denies non-healing wounds.   She had 2 strokes in 1982; Dr. Georgina Peer, cardiologist, has been monitoring her carotid arteries, pt states that she has a known heart murmur.  Patient states dramatic improvement in ability to walk without claudication symptoms since the above surgery; prior to the aortobifemoral bypass graft she would get claudication symptoms in both thighs and calves after 5 steps, and her toes were turning black.  Now her toes are pink and warm.   Patient's aortic valve was replaced July 2016 with a bovine valve. She reports feeling tired since this. Her walking seems limited by her general fatigue.  Pt states her blood pressure increases in a medical office, that at home it runs 120-140/60-80.  Pt Diabetic: No  Pt smoker: former smoker, quit in 2012   Pt meds include:  Statin :Yes  Betablocker: Yes  ASA: no  Other anticoagulants/antiplatelets: takes coumadin for ICA stenosis and since she had strokes, prescribed by Dr. Leighton Roach.     Past Medical History:  Diagnosis Date  . Aortic stenosis, severe   . Arthritis       . Childhood asthma   . Chronic bronchitis ([place])       . Claudication ([place])   . Coronary artery disease    a. April 2012 which revealed mid RCA occlusion with collaterals, 50% LAD stenosis,  30% circumflex stenosis, and 40% marginal stenosis  . GERD (gastroesophageal reflux disease)   . Glaucoma    early stage  . Hypertension   . Peripheral vascular disease ([place])   . 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 Seroma, postoperative    Left Groin  . Stroke [facility]) 1982 X 2   "a little bit weaker on the left side since" (08/29/2014)    Social History Social History  Substance Use Topics  . Smoking status: Former Smoker    Packs/day: 2.00    Years: 56.00    Types: Cigarettes    Quit date: 06/01/2010  . Smokeless tobacco: Never Used     Comment: HAD WEANED DOWN TO 1/4 PK A DAY BEFORE D/C  . Alcohol use 0.0 oz/week     Comment: 08/29/2014 "never drank much; quit in the 1990's"    Family History Family History  Problem Relation Age of Onset  . Heart disease Mother   . Varicose Veins Mother   . Stroke Father   . Hypertension Father   . Heart disease Father     Aneurysm  . Heart attack Father   . Arthritis Brother   . Diabetes Daughter     Past Surgical History:  Procedure Laterality Date  . AORTO-FEMORAL BYPASS GRAFT Bilateral 06/27/10  . BASAL CELL CARCINOMA EXCISION Left    face  . CARDIAC  CATHETERIZATION N/A 07/25/2014   Procedure: Right/Left Heart Cath and Coronary Angiography;  Surgeon: Troy Sine, MD;  Location: [place] [facility];  Service: Cardiovascular;  Laterality: N/A;  . COLONOSCOPY    . DILATION AND CURETTAGE OF UTERUS  "2 or 3"  . MULTIPLE TOOTH EXTRACTIONS    . PR VEIN BYPASS GRAFT,AORTO-FEM-POP  06/12/10  . TEE WITHOUT CARDIOVERSION N/A 08/31/2014   Procedure: TRANSESOPHAGEAL ECHOCARDIOGRAM (TEE);  Surgeon: Rexene Alberts, MD;  Location: [place];  Service: Open Heart Surgery;  Laterality: N/A;  . TRANSCATHETER AORTIC VALVE REPLACEMENT, TRANSFEMORAL N/A 08/31/2014   Procedure: TRANSCATHETER AORTIC VALVE REPLACEMENT, TRANSFEMORAL  approach;  Surgeon: Rexene Alberts, MD;  Location: [place];  Service: Open Heart Surgery;  Laterality: N/A;  . TUBAL LIGATION  1970's  . VAGINAL HYSTERECTOMY  1970's   Partial     Allergies  Allergen Reactions  . Adhesive [Tape] Rash  . Latex Rash  . Tetanus Toxoids Rash    Current Outpatient Prescriptions  Medication Sig Dispense Refill  . amLODipine (NORVASC) 5 MG tablet Take 1 tablet (5 mg total) by mouth daily. 30 tablet 6  . cetirizine (ZYRTEC) 10 MG tablet Take 10 mg by mouth daily.      Marland Kitchen estradiol (ESTRACE) 1 MG tablet 1 mg once.    . furosemide (LASIX) 20 MG tablet Take 20 mg by mouth as needed (fluid retention).     . meloxicam (MOBIC) 15 MG tablet Take 15 mg by mouth daily.      . metoprolol succinate (TOPROL-XL) 50 MG 24 hr tablet Take 1 tablet (50 mg total) by mouth daily. Take with or immediately following a meal. 30 tablet 6  . nitroGLYCERIN (NITROSTAT) 0.4 MG SL tablet Place 1 tablet (0.4 mg total) under the tongue every 5 (five) minutes as needed for chest pain. 25 tablet 6  . potassium chloride (KLOR-CON) 10 MEQ CR tablet Take 10 mEq by mouth as needed (when taking Lasix).     . rosuvastatin (CRESTOR) 20 MG tablet Take 20 mg by mouth daily.      Marland Kitchen warfarin (COUMADIN) 3 MG tablet Take 3 mg by mouth daily at 6 PM.      No current facility-administered medications for this visit.     ROS: See HPI for pertinent positives and negatives.   Physical Examination  Vitals:   03/16/16 1229  BP: 126/70  Pulse: (!) 58  Resp: 16  Temp: 97.5 F (36.4 C)  TempSrc: Oral  SpO2: 95%  Weight: 187 lb (84.8 kg)  Height: 5' 9.5" (1.765 m)   Body mass index is 27.22 kg/m.  General: A&O x 3, WDWN,  Gait: normal Eyes: PERRLA, Pulmonary: Respirations are non labored, CTAB, without wheezes, no rales or rhonchi. Cardiac: regular rythm, with murmur     Carotid Bruits Left Right   Transmitted cardiac murmur Transmitted cardiac murmur  Aorta: is not  palpable. Radial pulses: 2+ and equal   VASCULAR EXAM: Extremities without ischemic changes  without Gangrene; without open wounds.     LE Pulses LEFT RIGHT   FEMORAL  not palpable  3+palpable    POPLITEAL not palpable  not palpable   POSTERIOR TIBIAL 2+ palpable   2+palpable    DORSALIS PEDIS  ANTERIOR TIBIAL 2+palpable  2+palpable    Abdomen: soft, NT, no palpable masses. Skin: no rashes, no ulcers. Musculoskeletal: no muscle wasting or atrophy. Neurologic: A&O X 3; Appropriate Affect ; SENSATION: normal; MOTOR FUNCTION: moving all extremities equally, motor  strength 5/5 throughout. Speech is fluent/normal. CN 2-12 intact.     ASSESSMENT: TEOFILA BOWERY is a 76 y.o. female who is status post aortobifemoral bypass graft in April of 2012 and had dramatic resolution of her severe bilateral claudication symptoms since this surgery. There are no signs of ischemia in her feet/legs.   DATA ABI's today: Her ABI's and TBI's remain normal with all triphasic waveforms.     PLAN:  Continued graduated walking program.   Based on the patient's vascular studies and examination, pt will return to clinic in 1 year for ABI's. I advised her to notify our office if she develops concerns re the circulation in her feet/legs.   I discussed in depth with the patient the nature of atherosclerosis, and emphasized the importance of maximal medical management including strict control of blood pressure, blood glucose, and lipid levels, obtaining regular exercise, and continued cessation of smoking.  The patient is aware that without maximal medical management the underlying atherosclerotic disease process will progress, limiting the benefit of any  interventions.  The patient was given information about PAD including signs, symptoms, treatment, what symptoms should prompt the patient to seek immediate medical care, and risk reduction measures to take.  Clemon Chambers, RN, MSN, FNP-C [facility] Phone: [phone]  Clinic MD:   03/16/16 12:39 PM

## 2016-03-16 NOTE — Patient Instructions (Signed)

## 2016-03-19 NOTE — Addendum Note (Signed)
Addended by: Lianne Cure A on: 03/19/2016 01:02 PM   Modules accepted: Orders

## 2016-03-23 DIAGNOSIS — Z7901 Long term (current) use of anticoagulants: Secondary | ICD-10-CM | POA: Diagnosis not present

## 2016-03-31 ENCOUNTER — Encounter: Payer: Self-pay | Admitting: Cardiovascular Disease

## 2016-03-31 ENCOUNTER — Ambulatory Visit (INDEPENDENT_AMBULATORY_CARE_PROVIDER_SITE_OTHER): Payer: Medicare Other | Admitting: Cardiovascular Disease

## 2016-03-31 VITALS — BP 138/72 | HR 91 | Ht 69.0 in | Wt 187.0 lb

## 2016-03-31 DIAGNOSIS — Z95828 Presence of other vascular implants and grafts: Secondary | ICD-10-CM

## 2016-03-31 DIAGNOSIS — I2510 Atherosclerotic heart disease of native coronary artery without angina pectoris: Secondary | ICD-10-CM | POA: Diagnosis not present

## 2016-03-31 DIAGNOSIS — Z952 Presence of prosthetic heart valve: Secondary | ICD-10-CM

## 2016-03-31 DIAGNOSIS — Z7901 Long term (current) use of anticoagulants: Secondary | ICD-10-CM

## 2016-03-31 DIAGNOSIS — I480 Paroxysmal atrial fibrillation: Secondary | ICD-10-CM | POA: Diagnosis not present

## 2016-03-31 DIAGNOSIS — I10 Essential (primary) hypertension: Secondary | ICD-10-CM

## 2016-03-31 DIAGNOSIS — Z87891 Personal history of nicotine dependence: Secondary | ICD-10-CM

## 2016-03-31 DIAGNOSIS — E785 Hyperlipidemia, unspecified: Secondary | ICD-10-CM

## 2016-03-31 MED ORDER — METOPROLOL SUCCINATE ER 50 MG PO TB24: ORAL_TABLET | ORAL | 6 refills | Status: DC

## 2016-03-31 NOTE — Patient Instructions (Signed)
Your physician recommends that you return for lab work in: 3 weeks.  Your physician has recommended you make the following change in your medication:   1.) increase the metoprolol succ to 75 mg ( 1.5 tablet) daily for 5 days then increase to 2 tablets daily. ( 100 mg)  Your physician recommends that you schedule a follow-up appointment in: 4-6 weeks.

## 2016-04-01 NOTE — Progress Notes (Signed)
Patient ID: Katelyn [name], female   DOB: 1940/07/12, 76 y.o.   MRN: [id number]    PCP: Dr. Katharine Look HPI: Katelyn [name] is a 76 y.o. female who presents to the office for a 6 week follow-up cardiology evaluation  Katelyn [name] has known CAD and underwent initial cardiac catheterization in  April 2012 which revealed mid RCA occlusion with collaterals, 50% LAD stenosis, 30% circumflex stenosis, and 40% marginal stenosis. 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 Myra Gianotti. Katelyn [name] has a history of hypertension with grade 2 diastolic dysfunction, hyperlipidemia, and has developed moderately severe aortic valve stenosis. An echo Doppler study in May 2013 showed a mean transvalvular aortic gradient of 34 with a maximum of 56. In December 2013, her mean gradient was 32 and peak 56 with a valve area of 0.84. An echo Doppler study on 12/06/2012 continued to show normal systolic function with an ejection fraction of 60-65%. Katelyn [name] had grade 1 diastolic dysfunction. Peak gradients were  61 mm and mean gradient was 38 mm.  The aortic valve area 0.8-0.9 cm. Katelyn [name] had mild to moderate aortic insufficiency. Had mildly thickened mitral valve leaflets with mild MR. The left atrium was mildly dilated. Katelyn [name] had mild TR. Pulmonary pressure was mildly increased at 33 mm.  A follow-up echo Doppler study on 02/12/2014  Showed normal EF at 55-60%. Her aortic valve was severely calcified and stenosis was felt to be severe with a mean gradient of 42, a peak gradient of 64 which had slightly increaed from 38 and 61 mm, respectively.  Katelyn [name] began to develop exertional chest discomfort with associated dyspnea and fatigue.  Katelyn [name] underwent repeat cardiac catheterization on 07/25/2014 which showed a chronically occluded RCA with left-to-right collaterals, a 70% diagonal stenosis, 50% ostial circumflex  stenosis and 40% AV groove circumflex stenosis.  Her aortic stenosis at further progressed such that Katelyn [name] had a mean gradient of 42 mm.  Katelyn [name] was felt to be a poor candidate for conventional aortic valve replacement and on 08/31/2014 underwent successful  TAVR with placement of a 26 mm Edward Sapien 3 valve from the right femoral approach by Drs. Cornelius Moras and Harper.  Katelyn [name] developed postoperative atrial fibrillation for which Katelyn [name] was transiently treated with amiodarone and when seen for office follow-up Katelyn [name] was back in sinus rhythm.  Katelyn [name] has noticed improvement in energy.  Katelyn [name] is unaware of recurrent atrial fibrillation.  Katelyn [name] denies recurrent chest tightness.  Katelyn [name] at times is fatigued.  A subsequent echo on 10/01/2014 showed an EF of 60-65% with a mean aortic gradient of 16 mm and peak gradient 26 with trivial aortic insufficiency.    Katelyn [name] underwent a follow-up echo Doppler study on 09/09/2015.  This showed an EF of 60-65%.  There was grade 2 diastolic dysfunction.  There were no wall motion abnormalities.  Her 26 mm Edward's Sapien 3 valve was well-seated from her TAVR procedure.  The mean valve gradient was 16 mm.  There was no evidence for perivalvular leak.  There was mitral annular calcification with mild MR, mild LA dilatation and mild dilation of her right ventricle.  There is mild coronary hypertension with a PA pressure 33 mm.    When I  saw her in October 2017, her dose of valsartan had been reduced prior to the office visit by Dr. Sudie Bailey.  At that time, Katelyn [name] was having more frequent palpitations and  had trigeminy on her ECG.  With her being on reduce valsartan.  I recommended further titration of her beta blocker and increased her Toprol to 50 mg daily.    I saw her last in December 2017 at which time her blood pressure was significantly elevated.  Her resting pulse was 57.  Katelyn [name] was no longer taking valsartan and I added amlodipine 5 mg for more optimal blood pressure control.  Katelyn [name] recently underwent  lower extremity Doppler studies on 03/16/2016 by Dr. Velta Addison which revealed normal ABIs bilaterally.  Katelyn [name] denies chest pain.  Katelyn [name] has noticed some irregularity to her heart rate.  Katelyn [name] presents for evaluation.  Past Medical History:  Diagnosis Date  . Aortic stenosis, severe   . Arthritis       . Childhood asthma   . Chronic bronchitis ([place])       . Claudication ([place])   . Coronary artery disease    a. April 2012 which revealed mid RCA occlusion with collaterals, 50% LAD stenosis, 30% circumflex stenosis, and 40% marginal stenosis  . GERD (gastroesophageal reflux disease)   . Glaucoma    early stage  . Hypertension   . Peripheral vascular disease ([place])   . 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 Seroma, postoperative    Left Groin  . Stroke [facility]) 1982 X 2   "a little bit weaker on the left side since" (08/29/2014)    Past Surgical History:  Procedure Laterality Date  . AORTO-FEMORAL BYPASS GRAFT Bilateral 06/27/10  . BASAL CELL CARCINOMA EXCISION Left    face  . CARDIAC CATHETERIZATION N/A 07/25/2014   Procedure: Right/Left Heart Cath and Coronary Angiography;  Surgeon: Troy Sine, MD;  Location: [facility];  Service: Cardiovascular;  Laterality: N/A;  . COLONOSCOPY    . DILATION AND CURETTAGE OF UTERUS  "2 or 3"  . MULTIPLE TOOTH EXTRACTIONS    . PR VEIN BYPASS GRAFT,AORTO-FEM-POP  06/12/10  . TEE WITHOUT CARDIOVERSION N/A 08/31/2014   Procedure: TRANSESOPHAGEAL ECHOCARDIOGRAM (TEE);  Surgeon: Rexene Alberts, MD;  Location: [facility];  Service: Open Heart Surgery;  Laterality: N/A;  . TRANSCATHETER AORTIC VALVE REPLACEMENT, TRANSFEMORAL N/A 08/31/2014   Procedure: TRANSCATHETER AORTIC VALVE REPLACEMENT, TRANSFEMORAL approach;  Surgeon: Rexene Alberts, MD;  Location: [facility];  Service: Open Heart Surgery;  Laterality: N/A;  . TUBAL LIGATION  1970's  . VAGINAL  HYSTERECTOMY  1970's   Partial     Allergies  Allergen Reactions  . Adhesive [Tape] Rash  . Latex Rash  . Tetanus Toxoids Rash    Current Outpatient Prescriptions  Medication Sig Dispense Refill  . amLODipine (NORVASC) 5 MG tablet Take 1 tablet (5 mg total) by mouth daily. 30 tablet 6  . cetirizine (ZYRTEC) 10 MG tablet Take 10 mg by mouth daily.      Marland Kitchen estradiol (ESTRACE) 1 MG tablet 1 mg once.    . furosemide (LASIX) 20 MG tablet Take 20 mg by mouth as needed (fluid retention).     . meloxicam (MOBIC) 15 MG tablet Take 15 mg by mouth daily.      . nitroGLYCERIN (NITROSTAT) 0.4 MG SL tablet Place 1 tablet (0.4 mg total) under the tongue every 5 (five) minutes as needed for chest pain. 25 tablet 6  . potassium chloride (KLOR-CON) 10 MEQ CR tablet Take 10 mEq by mouth as needed (when taking Lasix).     Marland Kitchen  rosuvastatin (CRESTOR) 20 MG tablet Take 20 mg by mouth daily.      Marland Kitchen warfarin (COUMADIN) 3 MG tablet Take 3 mg by mouth daily at 6 PM.     . metoprolol succinate (TOPROL-XL) 50 MG 24 hr tablet Take 1.5 tablets daily for 5 days then increase to 2 tablets daily 60 tablet 6   No current facility-administered medications for this visit.     Social History   Social History  . Marital status: Married    Spouse name: N/A  . Number of children: N/A  . Years of education: N/A   Occupational History  . Not on file.   Social History Main Topics  . Smoking status: Former Smoker    Packs/day: 2.00    Years: 56.00    Types: Cigarettes    Quit date: 06/01/2010  . Smokeless tobacco: Never Used     Comment: HAD WEANED DOWN TO 1/4 PK A DAY BEFORE D/C  . Alcohol use 0.0 oz/week     Comment: 08/29/2014 "never drank much; quit in the 1990's"  . Drug use: No  . Sexual activity: Yes   Other Topics Concern  . Not on file   Social History Narrative  . No narrative on file    Family History  Problem Relation Age of Onset  . Heart disease Mother   . Varicose Veins Mother   . Stroke  Father   . Hypertension Father   . Heart disease Father     Aneurysm  . Heart attack Father   . Arthritis Brother   . Diabetes Daughter    Social history is notable that Katelyn [name] is married has one child. Katelyn [name] quit tobacco in April 2012 after a long-standing tobacco history having started smoking at age 89.   ROS General: Negative; No fevers, chills, or night sweats;  HEENT: Negative; No changes in vision or hearing, sinus congestion, difficulty swallowing Pulmonary: Negative; No cough, wheezing, shortness of breath, hemoptysis Cardiovascular: See history of present illness.   GI: Negative; No nausea, vomiting, diarrhea, or abdominal pain GU: Negative; No dysuria, hematuria, or difficulty voiding Musculoskeletal: Negative; no myalgias, joint pain, or weakness Hematologic/Oncology: Negative; no easy bruising, bleeding Endocrine: Negative; no heat/cold intolerance; no diabetes Neuro: Negative; no changes in balance, headaches Skin: Negative; No rashes or skin lesions Psychiatric: Negative; No behavioral problems, depression Sleep: Negative; No snoring, daytime sleepiness, hypersomnolence, bruxism, restless legs, hypnogognic hallucinations, no cataplexy Other comprehensive 14 point system review is negative.   PE BP 138/72   Pulse 91   Ht _0  (1.753 m)   Wt 187 lb (84.8 kg)   BMI 27.62 kg/m    Repeat blood pressure by me 136/80  Wt Readings from Last 3 Encounters:  03/31/16 187 lb (84.8 kg)  03/16/16 187 lb (84.8 kg)  02/12/16 187 lb 12.8 oz (85.2 kg)   General: Alert, oriented, no distress.  Skin: normal turgor, no rashes HEENT: Normocephalic, atraumatic. Pupils round and reactive; sclera anicteric;no lid lag.  Nose without nasal septal hypertrophy Mouth/Parynx benign; Mallinpatti scale 2 Neck: No JVD, no carotid bruits Lungs: clear to ausculatation and percussion; no wheezing or rales Heart: Irregularly irregular with a rate in the 90s ., s1 s2 normal 7-5/6 systolic  murmur compatible with her aortic bioprosthesis.  There was no audible aortic insufficiency. Abdomen: soft, nontender; no hepatosplenomehaly, BS+; abdominal aorta nontender and not dilated by palpation. Pulses 2+ Extremities: no clubbing cyanosis or edema, Homan's sign negative  Neurologic: grossly nonfocal Psychological:  Normal affect and mood.  ECG (independently read by me): Atrial fibrillation with a rate at 91.  Left bundle-branch block.  December 2017 ECG (independently read by me): Sinus bradycardia 57 bpm.  Left bundle branch block with repolarization changes.  October 2017 ECG (independently read by me): Sinus rhythm at 74 bpm with frequent PVCs in a trigeminal pattern.  March 2017 ECG (independently read by me): Sinus bradycardia 54 bpm.  Left bundle branch block with repolarization changes.  November 2016 ECG (independently read by me): Sinus bradycardia 55 bpm with occasional premature atrial complex.  Left bundle-branch block.  February 2016 ECG (independently read by me):  Sinus bradycardia 57 bpm.  Left bundle branch block with repolarization changes..  No ectopy.  December 2015 ECG (independently read by me): Sinus bradycardia 58 bpm.  Mild left atrial enlargement.  Left axis deviation.  Left bundle branch block with repolarization changes.  October 2014 ECG: Sinus bradycardia with left axis deviation. Left bundle branch block with repolarization changes  LAB BMP Latest Ref Rng & Units 03/13/2016 12/25/2015 01/16/2015  Glucose 65 - 99 mg/dL 88 98 84  BUN 7 - 25 mg/dL _0 Creatinine 0.60 - 0.93 mg/dL 1.06(H) 1.01(H) 0.98(H)  BUN/Creat Ratio 12 - 28 - 21 -  Sodium 135 - 146 mmol/L 140 145(H) 139  Potassium 3.5 - 5.3 mmol/L 4.3 4.6 4.8  Chloride 98 - 110 mmol/L 106 108(H) 103  CO2 20 - 31 mmol/L _1 Calcium 8.6 - 10.4 mg/dL 8.4(L) 8.9 9.4   Hepatic Function Latest Ref Rng & Units 03/13/2016 12/25/2015 01/16/2015  Total Protein 6.1 - 8.1 g/dL 6.2 6.4 6.4    Albumin 3.6 - 5.1 g/dL 3.8 4.2 3.7  AST 10 - 35 U/L _2 ALT 6 - 29 U/L _3 Alk Phosphatase 33 - 130 U/L 76 95 63  Total Bilirubin 0.2 - 1.2 mg/dL 0.4 0.5 0.7   CBC Latest Ref Rng & Units 03/13/2016 12/25/2015 01/16/2015  WBC 3.8 - 10.8 K/uL 5.6 6.4 6.9  Hemoglobin 11.7 - 15.5 g/dL 14.8 - 15.5(H)  Hematocrit 35.0 - 45.0 % 45.2(H) 45.8 46.5(H)  Platelets 140 - 400 K/uL 135(L) 129(L) 117(L)   Lab Results  Component Value Date   MCV 93.6 03/13/2016   MCV 94 12/25/2015   MCV 91.2 01/16/2015   Lab Results  Component Value Date   TSH 3.300 12/25/2015   Lipid Panel     Component Value Date/Time   CHOL 118 12/25/2015 0909   TRIG 144 12/25/2015 0909   HDL 45 12/25/2015 0909   CHOLHDL 2.6 12/25/2015 0909   CHOLHDL 2.5 01/16/2015 1028   VLDL 40 (H) 01/16/2015 1028   LDLCALC 44 12/25/2015 0909     RADIOLOGY: No results found.  IMPRESSION:  1. Paroxysmal atrial fibrillation (HCC)   2. Essential hypertension   3. CAD in native artery   4. Long term current use of anticoagulant therapy   5. S/P TAVR (transcatheter aortic valve replacement)   6. S/P aortobifemoral bypass surgery   7. Former smoker   8. Hyperlipidemia with target LDL less than 70      ASSESSMENT AND PLAN: Katelyn [name] is a 76 year old female who has documented CAD, and PVD who developed progressive aortic valve stenosis and underwent successful TAVR procedure on 08/31/2014 with placement of a 26 mm Edward Sapien 3 Valve from the right femoral approach.  Presently, Katelyn [name] denies any anginal symptoms.  Katelyn [name] had issues  with hypertension and had been maintained on 160 mg, valsartan daily, Toprol-XL 25 mg in addition to furosemide 20 mg as needed for leg swelling.  Several months ago, Dr. Karie Kirks was concerned that her blood pressure was dropping when Katelyn [name] was standing and he reduced her valsartan dose.  I have subsequently increased her beta blocker dose due to complaints of palpitations.  When last seen, Katelyn [name] had  stage II hypertension and was no longer taking valsartan.  Katelyn [name] has now been on amlodipine with improvement in blood pressure.  Her ECG today, however, shows atrial fibrillation which is new from her last ECG in December 2017.  Katelyn [name] continues to be on warfarin anticoagulation and her INR recently was 2.8.  I am further titrating Toprol-XL and Katelyn [name] will increase this to 75 mg for 5 days and after 5 days if Katelyn [name] still notes her heart rate irregular.  Katelyn [name] will then further titrate to 100 mg as long as her pulse is not 50s.  I will recheck laboratory.  I will see her in 4 weeks for reassessment.    Time spent: 25 minutes   Troy Sine, MD, [facility]  04/01/2016 8:13 PM

## 2016-04-03 ENCOUNTER — Encounter: Payer: Self-pay | Admitting: Internal Medicine

## 2016-04-15 DIAGNOSIS — I350 Nonrheumatic aortic (valve) stenosis: Secondary | ICD-10-CM | POA: Diagnosis not present

## 2016-04-15 DIAGNOSIS — Z7901 Long term (current) use of anticoagulants: Secondary | ICD-10-CM | POA: Diagnosis not present

## 2016-04-15 DIAGNOSIS — N951 Menopausal and female climacteric states: Secondary | ICD-10-CM | POA: Diagnosis not present

## 2016-04-15 DIAGNOSIS — I10 Essential (primary) hypertension: Secondary | ICD-10-CM | POA: Diagnosis not present

## 2016-04-20 DIAGNOSIS — I10 Essential (primary) hypertension: Secondary | ICD-10-CM | POA: Diagnosis not present

## 2016-04-20 DIAGNOSIS — Z7901 Long term (current) use of anticoagulants: Secondary | ICD-10-CM | POA: Diagnosis not present

## 2016-04-20 DIAGNOSIS — I480 Paroxysmal atrial fibrillation: Secondary | ICD-10-CM | POA: Diagnosis not present

## 2016-04-21 LAB — BASIC METABOLIC PANEL
BUN: 21 mg/dL (ref 7–25)
CALCIUM: 8.9 mg/dL (ref 8.6–10.4)
CO2: 28 mmol/L (ref 20–31)
CREATININE: 1.14 mg/dL — AB (ref 0.60–0.93)
Chloride: 106 mmol/L (ref 98–110)
Glucose, Bld: 99 mg/dL (ref 65–99)
Potassium: 4.6 mmol/L (ref 3.5–5.3)
Sodium: 141 mmol/L (ref 135–146)

## 2016-04-21 LAB — TSH: TSH: 2.98 m[IU]/L

## 2016-04-21 LAB — PROTIME-INR
INR: 1.5 — ABNORMAL HIGH
PROTHROMBIN TIME: 15.9 s — AB (ref 9.0–11.5)

## 2016-04-21 LAB — MAGNESIUM: MAGNESIUM: 1.8 mg/dL (ref 1.5–2.5)

## 2016-04-28 ENCOUNTER — Ambulatory Visit (INDEPENDENT_AMBULATORY_CARE_PROVIDER_SITE_OTHER): Payer: Medicare Other | Admitting: Allergy & Immunology

## 2016-04-28 ENCOUNTER — Encounter: Payer: Self-pay | Admitting: Allergy & Immunology

## 2016-04-28 VITALS — BP 120/80 | HR 85 | Temp 97.8°F | Resp 16 | Ht 68.9 in | Wt 183.8 lb

## 2016-04-28 DIAGNOSIS — J310 Chronic rhinitis: Secondary | ICD-10-CM

## 2016-04-28 DIAGNOSIS — J3489 Other specified disorders of nose and nasal sinuses: Secondary | ICD-10-CM | POA: Diagnosis not present

## 2016-04-28 MED ORDER — AZELASTINE-FLUTICASONE 137-50 MCG/ACT NA SUSP: 2.0000 | Freq: Every day | NASAL | 6 refills | Status: DC

## 2016-04-28 NOTE — Patient Instructions (Addendum)
1. Chronic rhinitis - likely allergic - We will get blood work to look for environmental allergies.  - In the meantime, start Dymista one spray per nostril 1-2 times daily.  - Start Allegra '180mg'$  once daily.  - We will call you one week with the results. - We can discuss possible treatment options once we get lab work back.   2. Return in about 6 months (around 10/26/2016).  Please inform us of any Emergency Department visits, hospitalizations, or changes in symptoms. Call us before going to the ED for breathing or allergy symptoms since we might be able to fit you in for a sick visit. Feel free to contact us anytime with any questions, problems, or concerns.  It was a pleasure to meet you today! Best wishes in the [place] Year!   Websites that have reliable patient information: 1. American Academy of Asthma, Allergy, and Immunology: www.aaaai.org 2. Food Allergy Research and Education (FARE): foodallergy.org 3. Mothers of Asthmatics: http://www.asthmacommunitynetwork.org 4. American College of Allergy, Asthma, and Immunology: www.acaai.org

## 2016-04-28 NOTE — Progress Notes (Signed)
NEW PATIENT  Date of Service/Encounter:  04/28/16  Referring provider: Robert Bellow, MD   Assessment:   Chronic rhinitis  Rhinorrhea  Postnasal drip  Current treatment with beta blocker    Plan/Recommendations:   1. Chronic rhinitis - likely allergic - We will get blood work to look for environmental allergies.  - In the meantime, start Dymista one spray per nostril 1-2 times daily (administration technique discussed).  - Start Allegra '180mg'$  once daily (samples provided)  - We will call you one week with the results of the testing.  - We can discuss possible treatment options once we get lab work back.  - There is a relative contraindication in starting allergy shots in patients on beta blockers, therefore if something comes up positive we will have to have a risks versus benefit discussion regarding starting allergy shots.  - The beta blocker is clearly indicated in a patient with Katelyn [name]'s history, therefore substituting another antiarrythmic is likely not an option.   2. Return in about 6 months (around 10/26/2016).  Subjective:   Katelyn [name] is a 76 y.o. female presenting today for evaluation of  Chief Complaint  Patient presents with  . Nasal Congestion    [name] has a history of the following: Patient Active Problem List   Diagnosis Date Noted  . Paroxysmal atrial fibrillation ([place]) 02/13/2016  . Hyperlipidemia LDL goal <70 05/07/2015  . 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 Severe aortic valve stenosis 08/31/2014  . Tachycardia-bradycardia syndrome ([place])   . Atrial fibrillation with RVR ([place]) 08/29/2014  . LBBB (left bundle branch block), chronic 08/29/2014  . Aortic stenosis, severe   . CAD in native artery   . Peripheral vascular disease ([place]) 12/13/2012  . Essential hypertension 12/13/2012  . Hyperlipidemia with target LDL less than 70 12/13/2012  . Atherosclerosis of native arteries of extremity with  intermittent claudication ([place]) 08/10/2011  . LUMBAR RADICULOPATHY, RIGHT 12/21/2007  . HIP PAIN, LEFT 10/19/2007    History obtained from: chart review and patient.  Katelyn [name] was referred by Robert Bellow, MD.     Katelyn [name] is a 76 y.o. female presenting for nasal congestion and rhinorrhea. She has had problems that have been ongoing for 5-6 years but have become more frequent as of late. She feels that all of her symptoms started following an abdominal aortic bypass in 2012. At that time, she presented with bilateral darkening feet. 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 She had open surgery to ameliorate this. In any case, she has had worsening nasal rhinorrhea since that time. These symptoms started when she gets up in the morning with nasal dripping and sneezing. At night, she has no problems whatsoever. Symptoms occur throughout the year. She was on cetirizine for a period of time, but this stopped providing any relief. Benadryl no longer helps.  She did go for an evaluation to a physician in [place], where she was given a nose spray. She does not recall the name, but she developed nose bleeds from it. 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 She is unsure whether she got worked up for bleeding disorders. She also has a cardiac history significant for an aortic valve replacement in July 2017.  Katelyn [name] does have a dog. She has had the dog for six years. She has carpeting throughout the home with  new carpeting put in since the dog entered the home. She does have some COPD but she does not use inhalers. She does endorse intermittent SOB.    Otherwise, there is no history of other atopic diseases, including asthma, drug allergies, food allergies, stinging insect allergies, or urticaria. There is no significant infectious history. Vaccinations are up to date.    Past Medical History: Patient Active  Problem List   Diagnosis Date Noted  . Paroxysmal atrial fibrillation ([place]) 02/13/2016  . Hyperlipidemia LDL goal <70 05/07/2015  . 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 Severe aortic valve stenosis 08/31/2014  . Tachycardia-bradycardia syndrome ([place])   . Atrial fibrillation with RVR ([place]) 08/29/2014  . LBBB (left bundle branch block), chronic 08/29/2014  . Aortic stenosis, severe   . CAD in native artery   . Peripheral vascular disease ([place]) 12/13/2012  . Essential hypertension 12/13/2012  . Hyperlipidemia with target LDL less than 70 12/13/2012  . Atherosclerosis of native arteries of extremity with intermittent claudication ([place]) 08/10/2011  . LUMBAR RADICULOPATHY, RIGHT 12/21/2007  . HIP PAIN, LEFT 10/19/2007    Medication List:  Allergies as of 04/28/2016      Reactions   Adhesive [tape] Rash   Latex Rash   Tetanus Toxoids Rash      Medication List       Accurate as of 04/28/16  9:52 AM. Always use your most recent med list.          amLODipine 5 MG tablet Commonly known as:  NORVASC Take 1 tablet (5 mg total) by mouth daily.   cetirizine 10 MG tablet Commonly known as:  ZYRTEC Take 10 mg by mouth daily.   estradiol 2 MG tablet Commonly known as:  ESTRACE   furosemide 20 MG tablet Commonly known as:  LASIX Take 20 mg by mouth as needed (fluid retention).   meloxicam 15 MG tablet Commonly known as:  MOBIC Take 15 mg by mouth daily.   metoprolol succinate 50 MG 24 hr tablet Commonly known as:  TOPROL-XL Take 1.5 tablets daily for 5 days then increase to 2 tablets daily   nitroGLYCERIN 0.4 MG SL tablet Commonly known as:  NITROSTAT Place 1 tablet (0.4 mg total) under the tongue every 5 (five) minutes as needed for chest pain.   potassium chloride 10 MEQ CR tablet Commonly known as:  KLOR-CON Take 10 mEq by mouth as needed (when taking Lasix).   rosuvastatin 20 MG tablet Commonly known as:  CRESTOR Take 20 mg by mouth  daily.   valsartan 160 MG tablet Commonly known as:  DIOVAN   warfarin 3 MG tablet Commonly known as:  COUMADIN Take 3 mg by mouth daily at 6 PM.       Birth History: non-contributory.  Developmental History: non-contributory.  Past Surgical History: Past Surgical History:  Procedure Laterality Date  . AORTO-FEMORAL BYPASS GRAFT Bilateral 06/27/10  . BASAL CELL CARCINOMA EXCISION Left    face  . CARDIAC CATHETERIZATION N/A 07/25/2014   Procedure: Right/Left Heart Cath and Coronary Angiography;  Surgeon: Troy Sine, MD;  Location: [facility];  Service: Cardiovascular;  Laterality: N/A;  . COLONOSCOPY    . DILATION AND CURETTAGE OF UTERUS  "2 or 3"  . MULTIPLE TOOTH EXTRACTIONS    . PR VEIN BYPASS GRAFT,AORTO-FEM-POP  06/12/10  . TEE WITHOUT CARDIOVERSION N/A 08/31/2014   Procedure: TRANSESOPHAGEAL ECHOCARDIOGRAM (TEE);  Surgeon: Rexene Alberts, MD;  Location: [facility];  Service: Open Heart  Surgery;  Laterality: N/A;  . TRANSCATHETER AORTIC VALVE REPLACEMENT, TRANSFEMORAL N/A 08/31/2014   Procedure: TRANSCATHETER AORTIC VALVE REPLACEMENT, TRANSFEMORAL approach;  Surgeon: Rexene Alberts, MD;  Location: [facility];  Service: Open Heart Surgery;  Laterality: N/A;  . TUBAL LIGATION  1970's  . VAGINAL HYSTERECTOMY  1970's   Partial      Family History: Family History  Problem Relation Age of Onset  . Heart disease Mother   . Varicose Veins Mother   . Stroke Father   . Hypertension Father   . Heart disease Father     Aneurysm  . Heart attack Father   . Arthritis Brother   . Diabetes Daughter   . Allergic rhinitis Neg Hx   . Angioedema Neg Hx   . Asthma Neg Hx   . Atopy Neg Hx   . Eczema Neg Hx   . Immunodeficiency Neg Hx   . Urticaria Neg Hx      Social History: Jaquetta lives at home with her husband. They live in a home with carpeting throughout. There is one dog that for around 5 years. They have electric heating and central cooling. There are dust mite coverings  on their bedding. There is no exposure to tobacco smoke. She was a smoker for 56 years.   Review of Systems: a 14-point review of systems is pertinent for what is mentioned in HPI.  Otherwise, all other systems were negative. Constitutional: negative other than that listed in the HPI Eyes: negative other than that listed in the HPI Ears, nose, mouth, throat, and face: negative other than that listed in the HPI Respiratory: negative other than that listed in the HPI Cardiovascular: negative other than that listed in the HPI Gastrointestinal: negative other than that listed in the HPI Genitourinary: negative other than that listed in the HPI Integument: negative other than that listed in the HPI Hematologic: negative other than that listed in the HPI Musculoskeletal: negative other than that listed in the HPI Neurological: negative other than that listed in the HPI Allergy/Immunologic: negative other than that listed in the HPI    Objective:   Blood pressure 120/80, pulse 85, temperature 97.8 F (36.6 C), temperature source Oral, resp. rate 16, height 5' 8.9" (1.75 m), weight 183 lb 12.8 oz (83.4 kg), SpO2 96 %. Body mass index is 27.22 kg/m.   Physical Exam:  General: Alert, interactive, in no acute distress. Cooperative with the exam. Friendly.  Eyes: No conjunctival injection present on the right, No conjunctival injection present on the left, PERRL bilaterally, No discharge on the right, No discharge on the left and No Horner-Trantas dots present Ears: Right TM pearly gray with normal light reflex, Left TM pearly gray with normal light reflex, Right TM intact without perforation and Left TM intact without perforation.  Nose/Throat: External nose within normal limits and septum midline, turbinates edematous and pale with clear discharge, post-pharynx erythematous with cobblestoning in the posterior oropharynx. Tonsils 2+ without exudates Neck: Supple without  thyromegaly. Adenopathy: no enlarged lymph nodes appreciated in the anterior cervical, occipital, axillary, epitrochlear, inguinal, or popliteal regions Lungs: Clear to auscultation without wheezing, rhonchi or rales. No increased work of breathing. CV: Normal S1/S2, no murmurs. Capillary refill <2 seconds.  Abdomen: Nondistended, nontender. No guarding or rebound tenderness. Bowel sounds present in all fields and hypoactive  Skin: Warm and dry, without lesions or rashes. Extremities:  No clubbing, cyanosis or edema. Neuro:   Grossly intact. No focal deficits appreciated. Responsive to questions.  Diagnostic studies: None    Salvatore Marvel, MD [facility]

## 2016-04-30 LAB — CP584 ZONE 3
Allergen, A. alternata, m6: <0.1 kU/L
Allergen, Black Locust, Acacia9: <0.1 kU/L
Allergen, D pternoyssinus,d7: <0.1 kU/L
Allergen, Mucor Racemosus, M4: <0.1 kU/L
Allergen, Mulberry, t76: <0.1 kU/L
Allergen, S. Botryosum, m10: <0.1 kU/L
Aspergillus fumigatus, m3: <0.1 kU/L
Bahia Grass: <0.1 kU/L
Bermuda Grass: <0.1 kU/L
Box Elder IgE: <0.1 kU/L
Cockroach: <0.1 kU/L
Common Ragweed: <0.1 kU/L
D. farinae: <0.1 kU/L
Nettle: <0.1 kU/L
Plantain: <0.1 kU/L

## 2016-05-06 ENCOUNTER — Encounter: Payer: Self-pay | Admitting: Cardiovascular Disease

## 2016-05-06 ENCOUNTER — Ambulatory Visit (INDEPENDENT_AMBULATORY_CARE_PROVIDER_SITE_OTHER): Payer: Medicare Other | Admitting: Cardiovascular Disease

## 2016-05-06 VITALS — BP 130/62 | HR 52 | Ht 68.0 in | Wt 188.0 lb

## 2016-05-06 DIAGNOSIS — Z95828 Presence of other vascular implants and grafts: Secondary | ICD-10-CM

## 2016-05-06 DIAGNOSIS — I480 Paroxysmal atrial fibrillation: Secondary | ICD-10-CM | POA: Diagnosis not present

## 2016-05-06 DIAGNOSIS — Z952 Presence of prosthetic heart valve: Secondary | ICD-10-CM | POA: Diagnosis not present

## 2016-05-06 DIAGNOSIS — I350 Nonrheumatic aortic (valve) stenosis: Secondary | ICD-10-CM | POA: Diagnosis not present

## 2016-05-06 DIAGNOSIS — E785 Hyperlipidemia, unspecified: Secondary | ICD-10-CM | POA: Diagnosis not present

## 2016-05-06 DIAGNOSIS — I2510 Atherosclerotic heart disease of native coronary artery without angina pectoris: Secondary | ICD-10-CM

## 2016-05-06 DIAGNOSIS — I10 Essential (primary) hypertension: Secondary | ICD-10-CM

## 2016-05-06 DIAGNOSIS — Z7901 Long term (current) use of anticoagulants: Secondary | ICD-10-CM | POA: Diagnosis not present

## 2016-05-06 MED ORDER — METOPROLOL SUCCINATE ER 50 MG PO TB24: ORAL_TABLET | ORAL | 6 refills | Status: DC

## 2016-05-06 NOTE — Patient Instructions (Signed)
Dr Claiborne Billings requests that you get a echo in July. Appointment to see him in August.  No changes were made today in your therapy.

## 2016-05-07 NOTE — Progress Notes (Signed)
Patient ID: Katelyn [name], female   DOB: 05-03-1940, 76 y.o.   MRN: [id number]    PCP: Katelyn [name] HPI: Katelyn [name] is a 76 y.o. female who presents to the office for a 2 month follow-up cardiology evaluation  Katelyn [name] has known CAD and underwent initial cardiac catheterization in  April 2012 which revealed mid RCA occlusion with collaterals, 50% LAD stenosis, 30% circumflex stenosis, and 40% marginal stenosis. 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 She has a history of hypertension with grade 2 diastolic dysfunction, hyperlipidemia, and has developed moderately severe aortic valve stenosis. An echo Doppler study in May 2013 showed a mean transvalvular aortic gradient of 34 with a maximum of 56. In December 2013, her mean gradient was 32 and peak 56 with a valve area of 0.84. An echo Doppler study on 12/06/2012 continued to show normal systolic function with an ejection fraction of 60-65%. She had grade 1 diastolic dysfunction. Peak gradients were  61 mm and mean gradient was 38 mm.  The aortic valve area 0.8-0.9 cm. She had mild to moderate aortic insufficiency. Had mildly thickened mitral valve leaflets with mild MR. The left atrium was mildly dilated. She had mild TR. Pulmonary pressure was mildly increased at 33 mm.  A follow-up echo Doppler study on 02/12/2014  Showed normal EF at 55-60%. Her aortic valve was severely calcified and stenosis was felt to be severe with a mean gradient of 42, a peak gradient of 64 which had slightly increaed from 38 and 61 mm, respectively.  She began to develop exertional chest discomfort with associated dyspnea and fatigue.  She underwent repeat cardiac catheterization on 07/25/2014 which showed a chronically occluded RCA with left-to-right collaterals, a 70% diagonal stenosis, 50% ostial circumflex  stenosis and 40% AV groove circumflex stenosis.  Her aortic stenosis at further progressed such that she had a mean gradient of 42 mm.  She was felt to be a poor candidate for conventional aortic valve replacement and on 08/31/2014 underwent successful  TAVR with placement of a 26 mm Edward Sapien 3 valve from the right femoral approach by Katelyn [name].  She developed postoperative atrial fibrillation for which she was transiently treated with amiodarone and when seen for office follow-up she was back in sinus rhythm.  She has noticed improvement in energy.  She is unaware of recurrent atrial fibrillation.  She denies recurrent chest tightness.  She at times is fatigued.  A subsequent echo on 10/01/2014 showed an EF of 60-65% with a mean aortic gradient of 16 mm and peak gradient 26 with trivial aortic insufficiency.    She underwent a follow-up echo Doppler study on 09/09/2015.  This showed an EF of 60-65%.  There was grade 2 diastolic dysfunction.  There were no wall motion abnormalities.  Her 26 mm Edward's Sapien 3 valve was well-seated from her TAVR procedure.  The mean valve gradient was 16 mm.  There was no evidence for perivalvular leak.  There was mitral annular calcification with mild MR, mild LA dilatation and mild dilation of her right ventricle.  There is mild coronary hypertension with a PA pressure 33 mm.    When I  saw her in October 2017, her dose of valsartan had been reduced prior to the office visit by Katelyn [name].  At that time, she was having more frequent palpitations and  had trigeminy on her ECG.  With her being on reduce valsartan.  I recommended further titration of her beta blocker and increased her Toprol to 50 mg daily.    When I saw her last in December 2017 at which time her blood pressure was significantly elevated.  Her resting pulse was 57.  She was no longer taking valsartan and I added amlodipine 5 mg for more optimal blood pressure control.  She  underwent  lower extremity Doppler studies on 03/16/2016 by Katelyn [name] which revealed normal ABIs bilaterally. When I saw her in f/u on 03/31/16 BP was improved but was in atrial fibrillation which was new.  She has continued to be on warfarin anticoagulation and her INR was therapeutic.  I further titrated Toprol-XL to 75 mg for 5 days .  Amended that she increase it to 100 mg depending upon her heart rate response.  She did take 100 mg, she did experience some dizziness and as result, self reduced her back to 75 mg daily.  She is unaware of her rhythm being irregular.  She presents for re-evaluation.  Past Medical History:  Diagnosis Date  . Aortic stenosis, severe   . Arthritis       . Childhood asthma   . Chronic bronchitis ([place])       . Claudication ([place])   . Coronary artery disease    a. April 2012 which revealed mid RCA occlusion with collaterals, 50% LAD stenosis, 30% circumflex stenosis, and 40% marginal stenosis  . GERD (gastroesophageal reflux disease)   . Glaucoma    early stage  . Hypertension   . Peripheral vascular disease ([place])   . 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 Seroma, postoperative    Left Groin  . Stroke [facility]) 1982 X 2   "a little bit weaker on the left side since" (08/29/2014)    Past Surgical History:  Procedure Laterality Date  . AORTO-FEMORAL BYPASS GRAFT Bilateral 06/27/10  . BASAL CELL CARCINOMA EXCISION Left    face  . CARDIAC CATHETERIZATION N/A 07/25/2014   Procedure: Right/Left Heart Cath and Coronary Angiography;  Surgeon: Katelyn Sine, MD;  Location: [facility];  Service: Cardiovascular;  Laterality: N/A;  . COLONOSCOPY    . DILATION AND CURETTAGE OF UTERUS  "2 or 3"  . MULTIPLE TOOTH EXTRACTIONS    . PR VEIN BYPASS GRAFT,AORTO-FEM-POP  06/12/10  . TEE WITHOUT CARDIOVERSION N/A 08/31/2014   Procedure: TRANSESOPHAGEAL ECHOCARDIOGRAM  (TEE);  Surgeon: Katelyn Alberts, MD;  Location: [facility];  Service: Open Heart Surgery;  Laterality: N/A;  . TRANSCATHETER AORTIC VALVE REPLACEMENT, TRANSFEMORAL N/A 08/31/2014   Procedure: TRANSCATHETER AORTIC VALVE REPLACEMENT, TRANSFEMORAL approach;  Surgeon: Katelyn Alberts, MD;  Location: [facility];  Service: Open Heart Surgery;  Laterality: N/A;  . TUBAL LIGATION  1970's  . VAGINAL HYSTERECTOMY  1970's   Partial     Allergies  Allergen Reactions  . Adhesive [Tape] Rash  . Latex Rash  . Tetanus Toxoids Rash    Current Outpatient Prescriptions  Medication Sig Dispense Refill  . amLODipine (NORVASC) 5 MG tablet Take 1 tablet (5 mg total) by mouth daily. 30 tablet 6  . Azelastine-Fluticasone 137-50 MCG/ACT SUSP Place 2 sprays into the nose daily. 23 g 6  . cetirizine (ZYRTEC) 10 MG tablet Take 10 mg by mouth daily.      Marland Kitchen estradiol (ESTRACE)  2 MG tablet     . furosemide (LASIX) 20 MG tablet Take 20 mg by mouth as needed (fluid retention).     . meloxicam (MOBIC) 15 MG tablet Take 15 mg by mouth daily.      . metoprolol succinate (TOPROL-XL) 50 MG 24 hr tablet Take 1.5 tablets daily 45 tablet 6  . nitroGLYCERIN (NITROSTAT) 0.4 MG SL tablet Place 1 tablet (0.4 mg total) under the tongue every 5 (five) minutes as needed for chest pain. 25 tablet 6  . potassium chloride (KLOR-CON) 10 MEQ CR tablet Take 10 mEq by mouth as needed (when taking Lasix).     . rosuvastatin (CRESTOR) 20 MG tablet Take 20 mg by mouth daily.      . valsartan (DIOVAN) 160 MG tablet     . warfarin (COUMADIN) 3 MG tablet Take 3 mg by mouth daily at 6 PM.      No current facility-administered medications for this visit.     Social History   Social History  . Marital status: Married    Spouse name: N/A  . Number of children: N/A  . Years of education: N/A   Occupational History  . Not on file.   Social History Main Topics  . Smoking status: Former Smoker    Packs/day: 2.00    Years: 56.00    Types: Cigarettes     Quit date: 06/01/2010  . Smokeless tobacco: Never Used     Comment: HAD WEANED DOWN TO 1/4 PK A DAY BEFORE D/C  . Alcohol use 0.0 oz/week     Comment: 08/29/2014 "never drank much; quit in the 1990's"  . Drug use: No  . Sexual activity: Yes   Other Topics Concern  . Not on file   Social History Narrative  . No narrative on file    Family History  Problem Relation Age of Onset  . Heart disease Mother   . Varicose Veins Mother   . Stroke Father   . Hypertension Father   . Heart disease Father     Aneurysm  . Heart attack Father   . Arthritis Brother   . Diabetes Daughter   . Allergic rhinitis Neg Hx   . Angioedema Neg Hx   . Asthma Neg Hx   . Atopy Neg Hx   . Eczema Neg Hx   . Immunodeficiency Neg Hx   . Urticaria Neg Hx    Social history is notable that she is married has one child. She quit tobacco in April 2012 after a long-standing tobacco history having started smoking at age 52.   ROS General: Negative; No fevers, chills, or night sweats;  HEENT: Negative; No changes in vision or hearing, sinus congestion, difficulty swallowing Pulmonary: Negative; No cough, wheezing, shortness of breath, hemoptysis Cardiovascular: See history of present illness.   GI: Negative; No nausea, vomiting, diarrhea, or abdominal pain GU: Negative; No dysuria, hematuria, or difficulty voiding Musculoskeletal: Negative; no myalgias, joint pain, or weakness Hematologic/Oncology: Negative; no easy bruising, bleeding Endocrine: Negative; no heat/cold intolerance; no diabetes Neuro: Negative; no changes in balance, headaches Skin: Negative; No rashes or skin lesions Psychiatric: Negative; No behavioral problems, depression Sleep: Negative; No snoring, daytime sleepiness, hypersomnolence, bruxism, restless legs, hypnogognic hallucinations, no cataplexy Other comprehensive 14 point system review is negative.   PE BP 130/62   Pulse (!) 52   Ht _0  (1.727 m)   Wt 188 lb (85.3 kg)    BMI 28.59 kg/m    Repeat blood  pressure by me 136/80  Wt Readings from Last 3 Encounters:  05/06/16 188 lb (85.3 kg)  04/28/16 183 lb 12.8 oz (83.4 kg)  03/31/16 187 lb (84.8 kg)   General: Alert, oriented, no distress.  Skin: normal turgor, no rashes HEENT: Normocephalic, atraumatic. Pupils round and reactive; sclera anicteric;no lid lag.  Nose without nasal septal hypertrophy Mouth/Parynx benign; Mallinpatti scale 2 Neck: No JVD, no carotid bruits Lungs: clear to ausculatation and percussion; no wheezing or rales Heart: Rhythm regular; rate in the 50s ., s1 s2 normal 2-0/9 systolic murmur compatible with her aortic bioprosthesis.  There was no audible aortic insufficiency. Abdomen: soft, nontender; no hepatosplenomehaly, BS+; abdominal aorta nontender and not dilated by palpation. Pulses 2+ Extremities: no clubbing cyanosis or edema, Homan's sign negative  Neurologic: grossly nonfocal Psychological: Normal affect and mood.  ECG (independently read by me): Sinus bradycardia 52 bpm.  First-degree AV block with a PR interval of 210 ms.  Left bundle branch block with repolarization changes.  PE the C.  January 2018 ECG (independently read by me): Atrial fibrillation with a rate at 91.  Left bundle-branch block.  December 2017 ECG (independently read by me): Sinus bradycardia 57 bpm.  Left bundle branch block with repolarization changes.  October 2017 ECG (independently read by me): Sinus rhythm at 74 bpm with frequent PVCs in a trigeminal pattern.  March 2017 ECG (independently read by me): Sinus bradycardia 54 bpm.  Left bundle branch block with repolarization changes.  November 2016 ECG (independently read by me): Sinus bradycardia 55 bpm with occasional premature atrial complex.  Left bundle-branch block.  February 2016 ECG (independently read by me):  Sinus bradycardia 57 bpm.  Left bundle branch block with repolarization changes..  No ectopy.  December 2015 ECG (independently  read by me): Sinus bradycardia 58 bpm.  Mild left atrial enlargement.  Left axis deviation.  Left bundle branch block with repolarization changes.  October 2014 ECG: Sinus bradycardia with left axis deviation. Left bundle branch block with repolarization changes  LAB BMP Latest Ref Rng & Units 04/20/2016 03/13/2016 12/25/2015  Glucose 65 - 99 mg/dL 99 88 98  BUN 7 - 25 mg/dL _0 Creatinine 0.60 - 0.93 mg/dL 1.14(H) 1.06(H) 1.01(H)  BUN/Creat Ratio 12 - 28 - - 21  Sodium 135 - 146 mmol/L 141 140 145(H)  Potassium 3.5 - 5.3 mmol/L 4.6 4.3 4.6  Chloride 98 - 110 mmol/L 106 106 108(H)  CO2 20 - 31 mmol/L _1 Calcium 8.6 - 10.4 mg/dL 8.9 8.4(L) 8.9   Hepatic Function Latest Ref Rng & Units 03/13/2016 12/25/2015 01/16/2015  Total Protein 6.1 - 8.1 g/dL 6.2 6.4 6.4  Albumin 3.6 - 5.1 g/dL 3.8 4.2 3.7  AST 10 - 35 U/L _2 ALT 6 - 29 U/L _3 Alk Phosphatase 33 - 130 U/L 76 95 63  Total Bilirubin 0.2 - 1.2 mg/dL 0.4 0.5 0.7   CBC Latest Ref Rng & Units 03/13/2016 12/25/2015 01/16/2015  WBC 3.8 - 10.8 K/uL 5.6 6.4 6.9  Hemoglobin 11.7 - 15.5 g/dL 14.8 - 15.5(H)  Hematocrit 35.0 - 45.0 % 45.2(H) 45.8 46.5(H)  Platelets 140 - 400 K/uL 135(L) 129(L) 117(L)   Lab Results  Component Value Date   MCV 93.6 03/13/2016   MCV 94 12/25/2015   MCV 91.2 01/16/2015   Lab Results  Component Value Date   TSH 2.98 04/20/2016   Lipid Panel     Component Value  Date/Time   CHOL 118 12/25/2015 0909   TRIG 144 12/25/2015 0909   HDL 45 12/25/2015 0909   CHOLHDL 2.6 12/25/2015 0909   CHOLHDL 2.5 01/16/2015 1028   VLDL 40 (H) 01/16/2015 1028   LDLCALC 44 12/25/2015 0909     RADIOLOGY: No results found.  IMPRESSION:  1. CAD in native artery   2. Essential hypertension   3. Aortic stenosis, severe   4. S/P TAVR (transcatheter aortic valve replacement)   5. PAF (paroxysmal atrial fibrillation) ([place])   6. Long term current use of anticoagulant therapy   7. S/P  aortobifemoral bypass surgery   8. Hyperlipidemia with target LDL less than 70      ASSESSMENT AND PLAN: Ms. Keisler is a 76 year old female who has documented CAD, and PVD who developed progressive aortic valve stenosis and underwent successful TAVR procedure on 08/31/2014 with placement of a 26 mm Edward Sapien 3 Valve from the right femoral approach. Recently developed stage II hypertension resulting in increased medical regimen.  When last seen, she developed atrial fibrillation for which he was unaware.  She has converted back to sinus rhythm on her increased beta blocker dose.  Her blood pressure today is stable on Toprol-XL 75 mg, valsartan 160 mg, furosemide 20 mg, and amlodipine 5 mg.  She is bradycardic.  I suspect an she had increase Toprol to 100 mg she may have become symptomatic with reference to bradycardia leading to dizziness.  Her for a follow-up echo Doppler study to reassess her aortic valve replacement and this will be scheduled for July 2018.  Recent lipid studies in October 2017 showed a LDL cholesterol at 44.  Her in August 2018 for follow-up evaluation.   Time spent: 25 minutes   Katelyn Sine, MD, [facility]  05/08/2016 8:06 PM

## 2016-05-18 DIAGNOSIS — Z7901 Long term (current) use of anticoagulants: Secondary | ICD-10-CM | POA: Diagnosis not present

## 2016-06-05 DIAGNOSIS — M542 Cervicalgia: Secondary | ICD-10-CM | POA: Diagnosis not present

## 2016-06-08 ENCOUNTER — Ambulatory Visit (HOSPITAL_COMMUNITY)
Admission: RE | Admit: 2016-06-08 | Discharge: 2016-06-08 | Disposition: A | Discharge status: Home / Self Care | Payer: Medicare Other | Source: Ambulatory Visit | Attending: Family Medicine | Admitting: Family Medicine

## 2016-06-08 ENCOUNTER — Other Ambulatory Visit (HOSPITAL_COMMUNITY): Payer: Self-pay | Admitting: Family Medicine

## 2016-06-08 DIAGNOSIS — M542 Cervicalgia: Secondary | ICD-10-CM

## 2016-06-08 DIAGNOSIS — M47812 Spondylosis without myelopathy or radiculopathy, cervical region: Secondary | ICD-10-CM | POA: Insufficient documentation

## 2016-06-29 DIAGNOSIS — Z7901 Long term (current) use of anticoagulants: Secondary | ICD-10-CM | POA: Diagnosis not present

## 2016-07-16 ENCOUNTER — Other Ambulatory Visit (HOSPITAL_COMMUNITY): Payer: Self-pay | Admitting: Family Medicine

## 2016-07-16 DIAGNOSIS — R0989 Other specified symptoms and signs involving the circulatory and respiratory systems: Secondary | ICD-10-CM

## 2016-07-17 ENCOUNTER — Ambulatory Visit (HOSPITAL_COMMUNITY)
Admission: RE | Admit: 2016-07-17 | Discharge: 2016-07-17 | Disposition: A | Discharge status: Home / Self Care | Payer: Medicare Other | Source: Ambulatory Visit | Attending: Internal Medicine | Admitting: Internal Medicine

## 2016-07-17 DIAGNOSIS — I6523 Occlusion and stenosis of bilateral carotid arteries: Secondary | ICD-10-CM | POA: Insufficient documentation

## 2016-07-17 DIAGNOSIS — R0989 Other specified symptoms and signs involving the circulatory and respiratory systems: Secondary | ICD-10-CM

## 2016-07-29 DIAGNOSIS — Z7901 Long term (current) use of anticoagulants: Secondary | ICD-10-CM | POA: Diagnosis not present

## 2016-07-29 DIAGNOSIS — I10 Essential (primary) hypertension: Secondary | ICD-10-CM | POA: Diagnosis not present

## 2016-07-29 DIAGNOSIS — Z954 Presence of other heart-valve replacement: Secondary | ICD-10-CM | POA: Diagnosis not present

## 2016-07-29 DIAGNOSIS — N951 Menopausal and female climacteric states: Secondary | ICD-10-CM | POA: Diagnosis not present

## 2016-07-29 DIAGNOSIS — Z87891 Personal history of nicotine dependence: Secondary | ICD-10-CM | POA: Diagnosis not present

## 2016-07-29 DIAGNOSIS — E785 Hyperlipidemia, unspecified: Secondary | ICD-10-CM | POA: Diagnosis not present

## 2016-07-31 ENCOUNTER — Other Ambulatory Visit: Payer: Self-pay | Admitting: Cardiovascular Disease

## 2016-08-03 DIAGNOSIS — H35363 Drusen (degenerative) of macula, bilateral: Secondary | ICD-10-CM | POA: Diagnosis not present

## 2016-08-25 DIAGNOSIS — Z7901 Long term (current) use of anticoagulants: Secondary | ICD-10-CM | POA: Diagnosis not present

## 2016-08-31 DIAGNOSIS — H25013 Cortical age-related cataract, bilateral: Secondary | ICD-10-CM | POA: Diagnosis not present

## 2016-08-31 DIAGNOSIS — H25012 Cortical age-related cataract, left eye: Secondary | ICD-10-CM | POA: Diagnosis not present

## 2016-08-31 DIAGNOSIS — H2512 Age-related nuclear cataract, left eye: Secondary | ICD-10-CM | POA: Diagnosis not present

## 2016-08-31 DIAGNOSIS — H2513 Age-related nuclear cataract, bilateral: Secondary | ICD-10-CM | POA: Diagnosis not present

## 2016-08-31 DIAGNOSIS — H35363 Drusen (degenerative) of macula, bilateral: Secondary | ICD-10-CM | POA: Diagnosis not present

## 2016-08-31 DIAGNOSIS — H35033 Hypertensive retinopathy, bilateral: Secondary | ICD-10-CM | POA: Diagnosis not present

## 2016-09-09 ENCOUNTER — Ambulatory Visit (HOSPITAL_COMMUNITY): Payer: Medicare Other | Attending: Cardiovascular Disease

## 2016-09-09 ENCOUNTER — Other Ambulatory Visit: Payer: Self-pay

## 2016-09-09 DIAGNOSIS — I350 Nonrheumatic aortic (valve) stenosis: Secondary | ICD-10-CM | POA: Insufficient documentation

## 2016-09-09 LAB — ECHOCARDIOGRAM COMPLETE
AV Peak grad: 21 mmHg
AV VEL mean LVOT/AV: 0.56
AV pk vel: 229 cm/s
AVG: 10 mmHg
AVLVOTPG: 6 mmHg
Ao pk vel: 0.52 m/s
CHL CUP DOP CALC LVOT VTI: 29.7 cm
DOP CAL AO MEAN VELOCITY: 151 cm/s
E/e' ratio: 26.61
EWDT: 324 ms
FS: 32 % (ref 28–44)
IV/PV OW: 1.39
LA diam end sys: 47 mm
LA vol A4C: 64 ml
LA vol index: 32.7 mL/m2
LADIAMINDEX: 2.36 cm/m2
LASIZE: 47 mm
LAVOL: 65 mL
LV E/e'average: 26.61
LV PW d: 11.3 mm — AB (ref 0.6–1.1)
LV TDI E'LATERAL: 4.51
LV e' LATERAL: 4.51 cm/s
LVEEMED: 26.61
LVOT peak VTI: 0.55 cm
LVOT peak vel: 120 cm/s
Lateral S' vel: 11.8 cm/s
MV Dec: 324
MV pk A vel: 118 m/s
MVPG: 6 mmHg
MVPKEVEL: 120 m/s
Reg peak vel: 281 cm/s
TDI e' medial: 5.64
TRMAXVEL: 281 cm/s
VTI: 54.1 cm

## 2016-09-28 DIAGNOSIS — Z7901 Long term (current) use of anticoagulants: Secondary | ICD-10-CM | POA: Diagnosis not present

## 2016-09-29 DIAGNOSIS — H25812 Combined forms of age-related cataract, left eye: Secondary | ICD-10-CM | POA: Diagnosis not present

## 2016-09-29 DIAGNOSIS — H2512 Age-related nuclear cataract, left eye: Secondary | ICD-10-CM | POA: Diagnosis not present

## 2016-09-29 HISTORY — PX: CATARACT EXTRACTION, BILATERAL: SHX1313

## 2016-10-05 ENCOUNTER — Ambulatory Visit (INDEPENDENT_AMBULATORY_CARE_PROVIDER_SITE_OTHER): Payer: Medicare Other | Admitting: Cardiovascular Disease

## 2016-10-05 ENCOUNTER — Encounter: Payer: Self-pay | Admitting: Cardiovascular Disease

## 2016-10-05 VITALS — BP 130/52 | HR 64 | Ht 69.5 in | Wt 188.6 lb

## 2016-10-05 DIAGNOSIS — I2510 Atherosclerotic heart disease of native coronary artery without angina pectoris: Secondary | ICD-10-CM

## 2016-10-05 DIAGNOSIS — I10 Essential (primary) hypertension: Secondary | ICD-10-CM | POA: Diagnosis not present

## 2016-10-05 DIAGNOSIS — Z79899 Other long term (current) drug therapy: Secondary | ICD-10-CM | POA: Diagnosis not present

## 2016-10-05 DIAGNOSIS — Z952 Presence of prosthetic heart valve: Secondary | ICD-10-CM

## 2016-10-05 DIAGNOSIS — I480 Paroxysmal atrial fibrillation: Secondary | ICD-10-CM | POA: Diagnosis not present

## 2016-10-05 DIAGNOSIS — E785 Hyperlipidemia, unspecified: Secondary | ICD-10-CM

## 2016-10-05 DIAGNOSIS — Z7901 Long term (current) use of anticoagulants: Secondary | ICD-10-CM | POA: Diagnosis not present

## 2016-10-05 MED ORDER — IRBESARTAN 150 MG PO TABS: 150.0000 mg | ORAL_TABLET | Freq: Every day | ORAL | 3 refills | Status: DC

## 2016-10-05 NOTE — Patient Instructions (Signed)
Medication Instructions:  START irbesartan 150 mg (1 tablet) daily  Labwork: Please return in 2 weeks for lab work (CMET, CBC, Lipids).   We will check with Dr. Karie Kirks for recent labs-if lipid panel was completed we will not repeat.    Follow-Up: Your physician wants you to follow-up in: 6 MONTHS with Dr. Claiborne Billings.  You will receive a reminder letter in the mail two months in advance. If you don't receive a letter, please call our office to schedule the follow-up appointment.   Any Other Special Instructions Will Be Listed Below (If Applicable).      If you need a refill on your cardiac medications before your next appointment, please call your pharmacy.

## 2016-10-05 NOTE — Progress Notes (Signed)
Patient ID: Katelyn [name], female   DOB: [birth year], 76 y.o.   MRN: [id number]    PCP: Dr. Newt Minion HPI: Katelyn [name] is a 76 y.o. female who presents to the office for  5 month follow-up cardiology evaluation  Katelyn [name] has known CAD and underwent initial cardiac catheterization in  April 2012 which revealed mid RCA occlusion with collaterals, 50% LAD stenosis, 30% circumflex stenosis, and 40% marginal stenosis. 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 Trula Slade. She has a history of hypertension with grade 2 diastolic dysfunction, hyperlipidemia, and has developed moderately severe aortic valve stenosis. An echo Doppler study in May 2013 showed a mean transvalvular aortic gradient of 34 with a maximum of 56. In December 2013, her mean gradient was 32 and peak 56 with a valve area of 0.84. An echo Doppler study on 12/06/2012 continued to show normal systolic function with an ejection fraction of 60-65%. She had grade 1 diastolic dysfunction. Peak gradients were  61 mm and mean gradient was 38 mm.  The aortic valve area 0.8-0.9 cm. She had mild to moderate aortic insufficiency. Had mildly thickened mitral valve leaflets with mild MR. The left atrium was mildly dilated. She had mild TR. Pulmonary pressure was mildly increased at 33 mm.  A follow-up echo Doppler study on 02/12/2014  Showed normal EF at 55-60%. Her aortic valve was severely calcified and stenosis was felt to be severe with a mean gradient of 42, a peak gradient of 64 which had slightly increaed from 38 and 61 mm, respectively.  She began to develop exertional chest discomfort with associated dyspnea and fatigue.  She underwent repeat cardiac catheterization on 07/25/2014 which showed a chronically occluded RCA with left-to-right collaterals, a 70% diagonal stenosis, 50% ostial circumflex  stenosis and 40% AV groove circumflex stenosis.  Her aortic stenosis at further progressed such that she had a mean gradient of 42 mm.  She was felt to be a poor candidate for conventional aortic valve replacement and on 08/31/2014 underwent successful  TAVR with placement of a 26 mm Edward Sapien 3 valve from the right femoral approach by Drs. Roxy Manns and [place].  She developed postoperative atrial fibrillation for which she was transiently treated with amiodarone and when seen for office follow-up she was back in sinus rhythm.  She has noticed improvement in energy.  She is unaware of recurrent atrial fibrillation.  She denies recurrent chest tightness.  She at times is fatigued.  A subsequent echo on 10/01/2014 showed an EF of 60-65% with a mean aortic gradient of 16 mm and peak gradient 26 with trivial aortic insufficiency.    She underwent a follow-up echo Doppler study on 09/09/2015.  This showed an EF of 60-65%.  There was grade 2 diastolic dysfunction.  There were no wall motion abnormalities.  Her 26 mm Edward's Sapien 3 valve was well-seated from her TAVR procedure.  The mean valve gradient was 16 mm.  There was no evidence for perivalvular leak.  There was mitral annular calcification with mild MR, mild LA dilatation and mild dilation of her right ventricle.  There is mild coronary hypertension with a PA pressure 33 mm.    When I  saw her in October 2017, her dose of valsartan had been reduced prior to the office visit by Dr. Karie [name].  At that time, she was having more frequent palpitations and  had trigeminy on her ECG.  With her being on reduce valsartan.  I recommended further titration of her beta blocker and increased her Toprol to 50 mg daily.    When I saw her in December 2017 her blood pressure was significantly elevated.  Her resting pulse was 57.  She was no longer taking valsartan and I added amlodipine 5 mg for more optimal blood pressure control.  She  underwent lower extremity  Doppler studies on 03/16/2016 by Katelyn [name] which revealed normal ABIs bilaterally. When I saw her in f/u on 03/31/16 BP was improved but was in atrial fibrillation which was new.  She has continued to be on warfarin anticoagulation and her INR was therapeutic.  I further titrated Toprol-XL to 75 mg for 5 days and recommended that she increase it to 100 mg depending upon her heart rate response.  She did take 100 mg, she did experience some dizziness and as result, self reduced her back to 75 mg daily.  She is unaware of her rhythm being irregular.  When I last saw her in March 2018 her blood pressure was stable.  On 09/09/2016, she underwent a one-year follow-up echo Doppler study.  This showed mild LVH with normal systolic function.  There was grade 1 diastolic dysfunction.  The TAVR bioprosthesis was functioning well.  She had mild LA dilation and mild pulmonary hypertension at 40 mm.  She recently underwent lab work in May by Dr. Sudie [name].  After she left the office.  I was able to obtain these results.  This showed a cholesterol of 128 and LDL of 54, HDL 49, and triglycerides of 168.  Her creatinine was 1.0.  Hemoglobin 15.7, hematocrit 45.8.  She had recently been notified by her pharmacy that the valsartan that she was taken was recently recalled.  As result, she stopped taking this actually 5 days ago.  She presents for evaluation  Past Medical History:  Diagnosis Date  . Aortic stenosis, severe   . Arthritis       . Childhood asthma   . Chronic bronchitis (HCC)       . Claudication (HCC)   . Coronary artery disease    a. April 2012 which revealed mid RCA occlusion with collaterals, 50% LAD stenosis, 30% circumflex stenosis, and 40% marginal stenosis  . GERD (gastroesophageal reflux disease)   . Glaucoma    early stage  . Hypertension   . Peripheral vascular disease (HCC)   . 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 Seroma, postoperative    Left Groin  . Stroke [facility]) 1982 X 2   "a little bit weaker on the left side since" (08/29/2014)    Past Surgical History:  Procedure Laterality Date  . AORTO-FEMORAL BYPASS GRAFT Bilateral 06/27/10  . BASAL CELL CARCINOMA EXCISION Left    face  . CARDIAC CATHETERIZATION N/A 07/25/2014   Procedure: Right/Left Heart Cath and Coronary Angiography;  Surgeon: Lennette Bihari, MD;  Location: [facility] INVASIVE CV LAB;  Service: Cardiovascular;  Laterality: N/A;  . COLONOSCOPY    . DILATION AND CURETTAGE OF UTERUS  "2 or 3"  . MULTIPLE TOOTH EXTRACTIONS    . PR VEIN BYPASS GRAFT,AORTO-FEM-POP  06/12/10  . TEE WITHOUT CARDIOVERSION N/A 08/31/2014   Procedure: TRANSESOPHAGEAL ECHOCARDIOGRAM (TEE);  Surgeon: Purcell Nails, MD;  Location: [facility] OR;  Service: Open Heart Surgery;  Laterality: N/A;  .  TRANSCATHETER AORTIC VALVE REPLACEMENT, TRANSFEMORAL N/A 08/31/2014   Procedure: TRANSCATHETER AORTIC VALVE REPLACEMENT, TRANSFEMORAL approach;  Surgeon: Rexene Alberts, MD;  Location: [place];  Service: Open Heart Surgery;  Laterality: N/A;  . TUBAL LIGATION  1970's  . VAGINAL HYSTERECTOMY  1970's   Partial     Allergies  Allergen Reactions  . Adhesive [Tape] Rash  . Latex Rash  . Tetanus Toxoids Rash    Current Outpatient Prescriptions  Medication Sig Dispense Refill  . amLODipine (NORVASC) 5 MG tablet Take 5 mg by mouth daily.    . Azelastine-Fluticasone 137-50 MCG/ACT SUSP Place 2 sprays into the nose as directed.    . cetirizine (ZYRTEC) 10 MG tablet Take 10 mg by mouth daily.      Marland Kitchen estradiol (ESTRACE) 2 MG tablet     . furosemide (LASIX) 20 MG tablet Take 20 mg by mouth as needed (fluid retention).     . meloxicam (MOBIC) 15 MG tablet Take 15 mg by mouth daily.      . metoprolol succinate (TOPROL-XL) 50 MG 24 hr tablet Take 1.5 tablets daily 45 tablet 6  . nitroGLYCERIN (NITROSTAT) 0.4 MG SL tablet Place 1 tablet (0.4 mg total) under  the tongue every 5 (five) minutes as needed for chest pain. 25 tablet 6  . potassium chloride (KLOR-CON) 10 MEQ CR tablet Take 10 mEq by mouth as needed (when taking Lasix).     . rosuvastatin (CRESTOR) 20 MG tablet Take 20 mg by mouth daily.      Marland Kitchen warfarin (COUMADIN) 3 MG tablet Take 3 mg by mouth daily at 6 PM.     . irbesartan (AVAPRO) 150 MG tablet Take 1 tablet (150 mg total) by mouth daily. 90 tablet 3   No current facility-administered medications for this visit.     Social History   Social History  . Marital status: Married    Spouse name: N/A  . Number of children: N/A  . Years of education: N/A   Occupational History  . Not on file.   Social History Main Topics  . Smoking status: Former Smoker    Packs/day: 2.00    Years: 56.00    Types: Cigarettes    Quit date: 06/01/2010  . Smokeless tobacco: Never Used     Comment: HAD WEANED DOWN TO 1/4 PK A DAY BEFORE D/C  . Alcohol use 0.0 oz/week     Comment: 08/29/2014 "never drank much; quit in the 1990's"  . Drug use: No  . Sexual activity: Yes   Other Topics Concern  . Not on file   Social History Narrative  . No narrative on file    Family History  Problem Relation Age of Onset  . Heart disease Mother   . Varicose Veins Mother   . Stroke Father   . Hypertension Father   . Heart disease Father        Aneurysm  . Heart attack Father   . Arthritis Brother   . Diabetes Daughter   . Allergic rhinitis Neg Hx   . Angioedema Neg Hx   . Asthma Neg Hx   . Atopy Neg Hx   . Eczema Neg Hx   . Immunodeficiency Neg Hx   . Urticaria Neg Hx    Social history is notable that she is married has one child. She quit tobacco in April 2012 after a long-standing tobacco history having started smoking at age 71.   ROS General: Negative; No fevers, chills, or night  sweats;  HEENT: Negative; No changes in vision or hearing, sinus congestion, difficulty swallowing Pulmonary: Negative; No cough, wheezing, shortness of breath,  hemoptysis Cardiovascular: See history of present illness.   GI: Negative; No nausea, vomiting, diarrhea, or abdominal pain GU: Negative; No dysuria, hematuria, or difficulty voiding Musculoskeletal: Negative; no myalgias, joint pain, or weakness Hematologic/Oncology: Negative; no easy bruising, bleeding Endocrine: Negative; no heat/cold intolerance; no diabetes Neuro: Negative; no changes in balance, headaches Skin: Negative; No rashes or skin lesions Psychiatric: Negative; No behavioral problems, depression Sleep: Negative; No snoring, daytime sleepiness, hypersomnolence, bruxism, restless legs, hypnogognic hallucinations, no cataplexy Other comprehensive 14 point system review is negative.   PE BP (!) 130/52   Pulse 64   Ht 5' 9.5" (1.765 m)   Wt 188 lb 9.6 oz (85.5 kg)   BMI 27.45 kg/m    Repeat blood pressure by me 136/80  Wt Readings from Last 3 Encounters:  10/05/16 188 lb 9.6 oz (85.5 kg)  05/06/16 188 lb (85.3 kg)  04/28/16 183 lb 12.8 oz (83.4 kg)   General: Alert, oriented, no distress.  Skin: normal turgor, no rashes HEENT: Normocephalic, atraumatic. Pupils round and reactive; sclera anicteric;no lid lag.  Nose without nasal septal hypertrophy Mouth/Parynx benign; Mallinpatti scale 2 Neck: No JVD, no carotid bruits Lungs: clear to ausculatation and percussion; no wheezing or rales Heart: Rhythm regular; rate in the 50s ., s1 s2 normal 0-8/6 systolic murmur compatible with her aortic bioprosthesis.  There was no audible aortic insufficiency. Abdomen: soft, nontender; no hepatosplenomehaly, BS+; abdominal aorta nontender and not dilated by palpation. Pulses 2+ Extremities: no clubbing cyanosis or edema, Homan's sign negative  Neurologic: grossly nonfocal Psychological: Normal affect and mood.  ECG (independently read by me): Sinus bradycardia with occasional PACs.  Left axis deviation.  Borderline first-degree AV block with a PR interval of 22 ms.  Left  bundle-branch block  March 2018 ECG (independently read by me): Sinus bradycardia 52 bpm.  First-degree AV block with a PR interval of 210 ms.  Left bundle branch block with repolarization changes.  PE the C.  January 2018 ECG (independently read by me): Atrial fibrillation with a rate at 91.  Left bundle-branch block.  December 2017 ECG (independently read by me): Sinus bradycardia 57 bpm.  Left bundle branch block with repolarization changes.  October 2017 ECG (independently read by me): Sinus rhythm at 74 bpm with frequent PVCs in a trigeminal pattern.  March 2017 ECG (independently read by me): Sinus bradycardia 54 bpm.  Left bundle branch block with repolarization changes.  November 2016 ECG (independently read by me): Sinus bradycardia 55 bpm with occasional premature atrial complex.  Left bundle-branch block.  February 2016 ECG (independently read by me):  Sinus bradycardia 57 bpm.  Left bundle branch block with repolarization changes..  No ectopy.  December 2015 ECG (independently read by me): Sinus bradycardia 58 bpm.  Mild left atrial enlargement.  Left axis deviation.  Left bundle branch block with repolarization changes.  October 2014 ECG: Sinus bradycardia with left axis deviation. Left bundle branch block with repolarization changes  LAB BMP Latest Ref Rng & Units 04/20/2016 03/13/2016 12/25/2015  Glucose 65 - 99 mg/dL 99 88 98  BUN 7 - 25 mg/dL _0 Creatinine 0.60 - 0.93 mg/dL 1.14(H) 1.06(H) 1.01(H)  BUN/Creat Ratio 12 - 28 - - 21  Sodium 135 - 146 mmol/L 141 140 145(H)  Potassium 3.5 - 5.3 mmol/L 4.6 4.3 4.6  Chloride 98 - 110 mmol/L  106 106 108(H)  CO2 20 - 31 mmol/L _0 Calcium 8.6 - 10.4 mg/dL 8.9 8.4(L) 8.9   Hepatic Function Latest Ref Rng & Units 03/13/2016 12/25/2015 01/16/2015  Total Protein 6.1 - 8.1 g/dL 6.2 6.4 6.4  Albumin 3.6 - 5.1 g/dL 3.8 4.2 3.7  AST 10 - 35 U/L _1 ALT 6 - 29 U/L _2 Alk Phosphatase 33 - 130 U/L 76 95 63    Total Bilirubin 0.2 - 1.2 mg/dL 0.4 0.5 0.7   CBC Latest Ref Rng & Units 03/13/2016 12/25/2015 01/16/2015  WBC 3.8 - 10.8 K/uL 5.6 6.4 6.9  Hemoglobin 11.7 - 15.5 g/dL 14.8 15.0 15.5(H)  Hematocrit 35.0 - 45.0 % 45.2(H) 45.8 46.5(H)  Platelets 140 - 400 K/uL 135(L) 129(L) 117(L)   Lab Results  Component Value Date   MCV 93.6 03/13/2016   MCV 94 12/25/2015   MCV 91.2 01/16/2015   Lab Results  Component Value Date   TSH 2.98 04/20/2016   Lipid Panel     Component Value Date/Time   CHOL 118 12/25/2015 0909   TRIG 144 12/25/2015 0909   HDL 45 12/25/2015 0909   CHOLHDL 2.6 12/25/2015 0909   CHOLHDL 2.5 01/16/2015 1028   VLDL 40 (H) 01/16/2015 1028   LDLCALC 44 12/25/2015 0909     RADIOLOGY: No results found.  IMPRESSION:  1. S/P TAVR (transcatheter aortic valve replacement)   2. CAD in native artery   3. Essential hypertension   4. PAF (paroxysmal atrial fibrillation) ([place])   5. Hyperlipidemia with target LDL less than 70   6. Medication management   7. Warfarin anticoagulation      ASSESSMENT AND PLAN: Katelyn [name] is a 76 year old female who has documented CAD, and PVD who developed progressive aortic valve stenosis and underwent successful TAVR procedure on 08/31/2014 with placement of a 26 mm Edward Sapien 3 Valve from the right femoral approach. Recently developed stage II hypertension resulting in increased medical regimen.  She had developed atrial fibrillation for which he was unaware.  She has converted back to sinus rhythm on her increased beta blocker dose.  She is on warfarin for anticoagulation and denies bleeding.  She underwent a one-year follow-up echo Doppler study.  I reviewed this with her in detail.  This continues to show normal systolic function.  Her bioprosthetic TAVR valve was functioning well without mobility restriction.  The mean gradient was 10 with a peak gradient of 21.  I reviewed her recent blood work done by Dr. Karie [name].  Her LDL continues to  be excellent, but triglycerides are mildly increased.  Her blood pressure today is stable and she had been on valsartan 160 mg, metoprolol succinate 75 mg, and amlodipine 5 mg with when necessary furosemide.  With the recall of her valsartan preparation, I will change her to irbesartan 150 mg.  She will undergo a repeat chemistry profile and CBC in several weeks.  She will also will be following up with Dr. Karie [name].  I will see her in 6 months for cardiology reevaluation.     Time spent: 25 minutes   Troy Sine, MD, [facility]  10/05/2016 6:34 PM

## 2016-10-21 DIAGNOSIS — H25011 Cortical age-related cataract, right eye: Secondary | ICD-10-CM | POA: Diagnosis not present

## 2016-10-21 DIAGNOSIS — H2511 Age-related nuclear cataract, right eye: Secondary | ICD-10-CM | POA: Diagnosis not present

## 2016-10-22 DIAGNOSIS — I10 Essential (primary) hypertension: Secondary | ICD-10-CM | POA: Diagnosis not present

## 2016-10-22 DIAGNOSIS — E785 Hyperlipidemia, unspecified: Secondary | ICD-10-CM | POA: Diagnosis not present

## 2016-10-22 DIAGNOSIS — I480 Paroxysmal atrial fibrillation: Secondary | ICD-10-CM | POA: Diagnosis not present

## 2016-10-22 DIAGNOSIS — I2510 Atherosclerotic heart disease of native coronary artery without angina pectoris: Secondary | ICD-10-CM | POA: Diagnosis not present

## 2016-10-22 DIAGNOSIS — Z79899 Other long term (current) drug therapy: Secondary | ICD-10-CM | POA: Diagnosis not present

## 2016-10-22 DIAGNOSIS — Z7901 Long term (current) use of anticoagulants: Secondary | ICD-10-CM | POA: Diagnosis not present

## 2016-10-22 LAB — CBC
HCT: 44.3 % (ref 35.0–45.0)
Hemoglobin: 14.6 g/dL (ref 11.7–15.5)
MCH: 30.9 pg (ref 27.0–33.0)
MCHC: 33 g/dL (ref 32.0–36.0)
MCV: 93.7 fL (ref 80.0–100.0)
MPV: 11.7 fL (ref 7.5–12.5)
Platelets: 134 10*3/uL — ABNORMAL LOW (ref 140–400)
RBC: 4.73 MIL/uL (ref 3.80–5.10)
RDW: 13.9 % (ref 11.0–15.0)
WBC: 6.3 10*3/uL (ref 3.8–10.8)

## 2016-10-23 LAB — COMPREHENSIVE METABOLIC PANEL
ALT: 16 U/L (ref 6–29)
AST: 21 U/L (ref 10–35)
Albumin: 3.8 g/dL (ref 3.6–5.1)
Alkaline Phosphatase: 79 U/L (ref 33–130)
BILIRUBIN TOTAL: 0.5 mg/dL (ref 0.2–1.2)
BUN: 20 mg/dL (ref 7–25)
CALCIUM: 9 mg/dL (ref 8.6–10.4)
CHLORIDE: 105 mmol/L (ref 98–110)
CO2: 25 mmol/L (ref 20–32)
CREATININE: 1.18 mg/dL — AB (ref 0.60–0.93)
GLUCOSE: 117 mg/dL — AB (ref 65–99)
Potassium: 4.8 mmol/L (ref 3.5–5.3)
SODIUM: 140 mmol/L (ref 135–146)
Total Protein: 6 g/dL — ABNORMAL LOW (ref 6.1–8.1)

## 2016-10-27 ENCOUNTER — Ambulatory Visit: Payer: Medicare Other | Admitting: Allergy & Immunology

## 2016-10-27 DIAGNOSIS — H25811 Combined forms of age-related cataract, right eye: Secondary | ICD-10-CM | POA: Diagnosis not present

## 2016-10-27 DIAGNOSIS — H2511 Age-related nuclear cataract, right eye: Secondary | ICD-10-CM | POA: Diagnosis not present

## 2016-11-06 ENCOUNTER — Other Ambulatory Visit: Payer: Self-pay | Admitting: Cardiovascular Disease

## 2016-11-06 NOTE — Telephone Encounter (Signed)
Rx has been sent to the pharmacy electronically. ° °

## 2016-11-10 ENCOUNTER — Ambulatory Visit (INDEPENDENT_AMBULATORY_CARE_PROVIDER_SITE_OTHER): Payer: Medicare Other | Admitting: Allergy & Immunology

## 2016-11-10 ENCOUNTER — Encounter: Payer: Self-pay | Admitting: Allergy & Immunology

## 2016-11-10 ENCOUNTER — Ambulatory Visit: Payer: Medicare Other | Admitting: Allergy & Immunology

## 2016-11-10 VITALS — BP 130/60 | HR 65 | Temp 97.8°F | Resp 16

## 2016-11-10 DIAGNOSIS — Z7901 Long term (current) use of anticoagulants: Secondary | ICD-10-CM | POA: Diagnosis not present

## 2016-11-10 DIAGNOSIS — J310 Chronic rhinitis: Secondary | ICD-10-CM | POA: Diagnosis not present

## 2016-11-10 DIAGNOSIS — I10 Essential (primary) hypertension: Secondary | ICD-10-CM | POA: Diagnosis not present

## 2016-11-10 DIAGNOSIS — E785 Hyperlipidemia, unspecified: Secondary | ICD-10-CM | POA: Diagnosis not present

## 2016-11-10 DIAGNOSIS — I350 Nonrheumatic aortic (valve) stenosis: Secondary | ICD-10-CM | POA: Diagnosis not present

## 2016-11-10 MED ORDER — IPRATROPIUM BROMIDE 0.06 % NA SOLN: 1.0000 | Freq: Four times a day (QID) | NASAL | 3 refills | Status: DC

## 2016-11-10 NOTE — Progress Notes (Signed)
FOLLOW UP  Date of Service/Encounter:  11/10/16   Assessment:   Non-allergic rhinitis  Plan/Recommendations:   1. Non-allergic rhinitis (negative testing in Feb/March 2018) - Continue with Dymista one spray per nostril 1-2 times daily.  - Continue Allegra 180mg  once daily.  - Add nasal ipratropium one spray per nostril every 6 hours as needed for runny nose.   2. Return in about 6 months (around 05/10/2017).   Subjective:   Katelyn [name] is a 76 y.o. female presenting today for follow up of  Chief Complaint  Patient presents with  . Allergic Rhinitis     ELKY FUNCHES has a history of the following: Patient Active Problem List   Diagnosis Date Noted  . Non-allergic rhinitis 11/10/2016  . Paroxysmal atrial fibrillation ([place]) 02/13/2016  . Hyperlipidemia LDL goal <70 05/07/2015  . 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 Severe aortic valve stenosis 08/31/2014  . Tachycardia-bradycardia syndrome ([place])   . Atrial fibrillation with RVR ([place]) 08/29/2014  . LBBB (left bundle branch block), chronic 08/29/2014  . Aortic stenosis, severe   . CAD in native artery   . Peripheral vascular disease ([place]) 12/13/2012  . Essential hypertension 12/13/2012  . Hyperlipidemia with target LDL less than 70 12/13/2012  . Atherosclerosis of native arteries of extremity with intermittent claudication ([place]) 08/10/2011  . LUMBAR RADICULOPATHY, RIGHT 12/21/2007  . HIP PAIN, LEFT 10/19/2007    History obtained from: chart review and patient.  Katelyn [name] Primary Care Provider is Lemmie Evens, MD.     Katelyn [name] is a 76 y.o. female presenting for a follow up visit. She was first seen in February 2018 for a new patient evaluation. She was endorsing chronic nasal drainage with throat clearing. I recommended doing Dymista and Allegra to help with the drainage. We did get environmental allergy testing via the blood which was completely negative.   Since the last  visit, she has done somewhat better. However, she still travels around with a tissue in case her nose decides to start running. She is using the [place] regularly which does provide some relief. Symptoms do not seem to worsen during a particular time of the year. She is not using the Allegra regularly.  She has otherwise done well. She did have surgery to correct a heart valve as well as her aorta. She recently underwent cataract surgery and this is the first time that she drove herself in quite some time. She did not do any traveling this summer but she would like to go back to [place] where she was born (in [place], [place]).  Otherwise, there have been no changes to her past medical history, surgical history, family history, or social history.    Review of Systems: a 14-point review of systems is pertinent for what is mentioned in HPI.  Otherwise, all other systems were negative. Constitutional: negative other than that listed in the HPI Eyes: negative other than that listed in the HPI Ears, nose, mouth, throat, and face: negative other than that listed in the HPI Respiratory: negative other than that listed in the HPI Cardiovascular: negative other than that listed in the HPI Gastrointestinal: negative other than that listed in the HPI Genitourinary: negative other than that listed in the HPI Integument: negative other than that listed in the HPI Hematologic: negative other than that listed in the HPI Musculoskeletal: negative other than that listed in the HPI Neurological: negative other than that listed in the HPI Allergy/Immunologic: negative other than  that listed in the HPI    Objective:   Blood pressure 130/60, pulse 65, temperature 97.8 F (36.6 C), temperature source Oral, resp. rate 16, SpO2 95 %. There is no height or weight on file to calculate BMI.   Physical Exam:  General: Alert, interactive, in no acute distress. Pleasant adorable female.  Eyes: No conjunctival  injection present on the right, No conjunctival injection present on the left, PERRL bilaterally, No discharge on the right, No discharge on the left and No Horner-Trantas dots present Ears: Right TM pearly gray with normal light reflex, Left TM pearly gray with normal light reflex, Right TM intact without perforation and Left TM intact without perforation.  Nose/Throat: External nose within normal limits and septum midline, turbinates minimally edematous without discharge, post-pharynx erythematous with cobblestoning in the posterior oropharynx. Tonsils 2+ without exudates Neck: Supple without thyromegaly. Lungs: Clear to auscultation without wheezing, rhonchi or rales. No increased work of breathing. CV: Normal S1/S2, no murmurs. Capillary refill <2 seconds.  Skin: Warm and dry, without lesions or rashes. Neuro:   Grossly intact. No focal deficits appreciated. Responsive to questions.   Diagnostic studies: none    Salvatore Marvel, MD [place] [facility] [place]

## 2016-11-10 NOTE — Patient Instructions (Addendum)
1. Non-allergic rhinitis (negative testing in Feb/March 2018) - Continue with Dymista one spray per nostril 1-2 times daily.  - Continue Allegra 180mg  once daily.  - Add nasal ipratropium one spray per nostril every 6 hours as needed for runny nose.   2. Return in about 6 months (around 05/10/2017).   Please inform us of any Emergency Department visits, hospitalizations, or changes in symptoms. Call us before going to the ED for breathing or allergy symptoms since we might be able to fit you in for a sick visit. Feel free to contact us anytime with any questions, problems, or concerns.  It was a pleasure to see you again today! Enjoy the upcoming fall season!  Websites that have reliable patient information: 1. American Academy of Asthma, Allergy, and Immunology: www.aaaai.org 2. Food Allergy Research and Education (FARE): foodallergy.org 3. Mothers of Asthmatics: http://www.asthmacommunitynetwork.org 4. American College of Allergy, Asthma, and Immunology: www.acaai.org   Election Day is coming up on Tuesday, November 6th! Make your voice heard! Register to vote at [email or web address]!

## 2016-11-24 DIAGNOSIS — Z7901 Long term (current) use of anticoagulants: Secondary | ICD-10-CM | POA: Diagnosis not present

## 2016-12-01 ENCOUNTER — Other Ambulatory Visit: Payer: Self-pay | Admitting: Cardiovascular Disease

## 2016-12-21 ENCOUNTER — Other Ambulatory Visit: Payer: Self-pay | Admitting: *Deleted

## 2016-12-21 DIAGNOSIS — I6523 Occlusion and stenosis of bilateral carotid arteries: Secondary | ICD-10-CM

## 2016-12-22 DIAGNOSIS — Z23 Encounter for immunization: Secondary | ICD-10-CM | POA: Diagnosis not present

## 2016-12-24 DIAGNOSIS — Z7901 Long term (current) use of anticoagulants: Secondary | ICD-10-CM | POA: Diagnosis not present

## 2017-01-29 DIAGNOSIS — Z7901 Long term (current) use of anticoagulants: Secondary | ICD-10-CM | POA: Diagnosis not present

## 2017-02-07 ENCOUNTER — Other Ambulatory Visit: Payer: Self-pay | Admitting: Cardiovascular Disease

## 2017-02-16 ENCOUNTER — Other Ambulatory Visit (HOSPITAL_COMMUNITY): Payer: Self-pay | Admitting: Family Medicine

## 2017-02-16 DIAGNOSIS — Z1231 Encounter for screening mammogram for malignant neoplasm of breast: Secondary | ICD-10-CM

## 2017-02-16 DIAGNOSIS — I350 Nonrheumatic aortic (valve) stenosis: Secondary | ICD-10-CM | POA: Diagnosis not present

## 2017-02-16 DIAGNOSIS — Z7901 Long term (current) use of anticoagulants: Secondary | ICD-10-CM | POA: Diagnosis not present

## 2017-02-16 DIAGNOSIS — R600 Localized edema: Secondary | ICD-10-CM | POA: Diagnosis not present

## 2017-02-16 DIAGNOSIS — I10 Essential (primary) hypertension: Secondary | ICD-10-CM | POA: Diagnosis not present

## 2017-02-24 ENCOUNTER — Ambulatory Visit (HOSPITAL_COMMUNITY)
Admission: RE | Admit: 2017-02-24 | Discharge: 2017-02-24 | Disposition: A | Discharge status: Home / Self Care | Payer: Medicare Other | Source: Ambulatory Visit | Attending: Family Medicine | Admitting: Family Medicine

## 2017-02-24 DIAGNOSIS — Z1231 Encounter for screening mammogram for malignant neoplasm of breast: Secondary | ICD-10-CM

## 2017-03-03 ENCOUNTER — Other Ambulatory Visit (HOSPITAL_COMMUNITY)
Admission: RE | Admit: 2017-03-03 | Discharge: 2017-03-03 | Disposition: A | Discharge status: Home / Self Care | Payer: Medicare Other | Source: Ambulatory Visit | Attending: Family Medicine | Admitting: Family Medicine

## 2017-03-03 ENCOUNTER — Ambulatory Visit (HOSPITAL_COMMUNITY)
Admission: RE | Admit: 2017-03-03 | Discharge: 2017-03-03 | Disposition: A | Discharge status: Home / Self Care | Payer: Medicare Other | Source: Ambulatory Visit | Attending: Family Medicine | Admitting: Family Medicine

## 2017-03-03 ENCOUNTER — Other Ambulatory Visit (HOSPITAL_COMMUNITY): Payer: Self-pay | Admitting: Family Medicine

## 2017-03-03 DIAGNOSIS — Z954 Presence of other heart-valve replacement: Secondary | ICD-10-CM | POA: Diagnosis not present

## 2017-03-03 DIAGNOSIS — M7989 Other specified soft tissue disorders: Secondary | ICD-10-CM | POA: Insufficient documentation

## 2017-03-03 DIAGNOSIS — R600 Localized edema: Secondary | ICD-10-CM | POA: Diagnosis not present

## 2017-03-03 DIAGNOSIS — Z7901 Long term (current) use of anticoagulants: Secondary | ICD-10-CM | POA: Diagnosis not present

## 2017-03-03 LAB — D-DIMER, QUANTITATIVE: D-Dimer, Quant: 0.92 ug/mL-FEU — ABNORMAL HIGH (ref 0.00–0.50)

## 2017-03-03 LAB — CBC
HEMATOCRIT: 46.2 % — AB (ref 36.0–46.0)
Hemoglobin: 14.6 g/dL (ref 12.0–15.0)
MCH: 30.4 pg (ref 26.0–34.0)
MCHC: 31.6 g/dL (ref 30.0–36.0)
MCV: 96.3 fL (ref 78.0–100.0)
Platelets: 130 10*3/uL — ABNORMAL LOW (ref 150–400)
RBC: 4.8 MIL/uL (ref 3.87–5.11)
RDW: 14 % (ref 11.5–15.5)
WBC: 6.9 10*3/uL (ref 4.0–10.5)

## 2017-03-08 DIAGNOSIS — Z7901 Long term (current) use of anticoagulants: Secondary | ICD-10-CM | POA: Diagnosis not present

## 2017-03-10 ENCOUNTER — Ambulatory Visit (INDEPENDENT_AMBULATORY_CARE_PROVIDER_SITE_OTHER): Payer: Medicare Other | Admitting: Family Medicine

## 2017-03-10 ENCOUNTER — Other Ambulatory Visit: Payer: Self-pay

## 2017-03-10 ENCOUNTER — Encounter: Payer: Self-pay | Admitting: Family Medicine

## 2017-03-10 VITALS — BP 138/70 | HR 60 | Temp 97.3°F | Resp 16 | Ht 69.0 in | Wt 193.1 lb

## 2017-03-10 DIAGNOSIS — Z7901 Long term (current) use of anticoagulants: Secondary | ICD-10-CM | POA: Insufficient documentation

## 2017-03-10 DIAGNOSIS — E785 Hyperlipidemia, unspecified: Secondary | ICD-10-CM

## 2017-03-10 DIAGNOSIS — Z79890 Hormone replacement therapy (postmenopausal): Secondary | ICD-10-CM | POA: Diagnosis not present

## 2017-03-10 DIAGNOSIS — I10 Essential (primary) hypertension: Secondary | ICD-10-CM | POA: Diagnosis not present

## 2017-03-10 DIAGNOSIS — I480 Paroxysmal atrial fibrillation: Secondary | ICD-10-CM | POA: Diagnosis not present

## 2017-03-10 NOTE — Patient Instructions (Signed)
Need old records  See me for a PE in one month

## 2017-03-10 NOTE — Progress Notes (Signed)
Chief Complaint  Patient presents with  . Hypertension    est care   This is a new patient to establish.  She has coronary artery disease, atrial fibrillation, history of valvular disease and valve replacement who is followed by cardiology.  She sees Dr. Claiborne Billings in [place].  She is on chronic anticoagulation in the form of warfarin.  She gets her INR checked once a month.  Currently followed by her family practitioner Dr.  She wishes to be followed here.  She inquires whether she could be placed on an anticoagulation regimen then did not require dietary restrictions and blood work.  I will ask her cardiologist if she can change to a newer agent. She had a hysterectomy many years ago for unknown reasons.  She does not think she was ever diagnosed with cancer.  She is on post surgery/postmenopausal hormone replacement ever since.  She takes Estrace 2 mg a day.  We discussed that is not optimal to stay on estrogen for life.  Does increase her risk of breast cancer.  She states she does get mammograms every year.  She states that at her age she would rather risk of breast cancer then go off of the estrogen. She has hypertension, well controlled.  She has hyperlipidemia, controlled on statin. She does not get any exercise.  She states she has arthritis in her back. She likes to crochet, however has significant arthritis in her hands. She states that her immunizations are up-to-date.  She has had flu, Prevnar, and shingles shot.  Uncertain tetanus status. She states she has had a colonoscopy some years ago.  She does not think it is yet due.  It was normal.   Patient Active Problem List   Diagnosis Date Noted  . Chronic anticoagulation 03/10/2017  . Current long-term use of postmenopausal hormone replacement therapy 03/10/2017  . Non-allergic rhinitis 11/10/2016  . Paroxysmal atrial fibrillation ([place]) 02/13/2016  . 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  Tachycardia-bradycardia syndrome ([place])   . Atrial fibrillation with RVR ([place]) 08/29/2014  . LBBB (left bundle branch block), chronic 08/29/2014  . CAD in native artery   . Peripheral vascular disease ([place]) 12/13/2012  . Essential hypertension 12/13/2012  . Hyperlipidemia with target LDL less than 70 12/13/2012  . Atherosclerosis of native arteries of extremity with intermittent claudication ([place]) 08/10/2011    Outpatient Encounter Medications as of 03/10/2017  Medication Sig  . amLODipine (NORVASC) 5 MG tablet take 1 tablet by mouth once daily  . Azelastine-Fluticasone 137-50 MCG/ACT SUSP Place 2 sprays into the nose as directed.  . cetirizine (ZYRTEC) 10 MG tablet Take 10 mg by mouth daily.    Marland Kitchen estradiol (ESTRACE) 2 MG tablet   . furosemide (LASIX) 20 MG tablet Take 20 mg by mouth as needed (fluid retention).   Marland Kitchen ipratropium (ATROVENT) 0.06 % nasal spray Place 1 spray into both nostrils 4 (four) times daily.  . irbesartan (AVAPRO) 150 MG tablet Take 1 tablet (150 mg total) by mouth daily.  . meloxicam (MOBIC) 15 MG tablet Take 15 mg by mouth daily.    . metoprolol succinate (TOPROL-XL) 25 MG 24 hr tablet Take 3 tablets (75 mg total) by mouth daily.  . nitroGLYCERIN (NITROSTAT) 0.4 MG SL tablet Place 1 tablet (0.4 mg total) under the tongue every 5 (five) minutes as needed for chest pain.  . potassium chloride (KLOR-CON) 10 MEQ CR tablet Take 10 mEq by mouth as needed (when taking Lasix).   Marland Kitchen  rosuvastatin (CRESTOR) 20 MG tablet Take 20 mg by mouth daily.    Marland Kitchen warfarin (COUMADIN) 3 MG tablet Take 3 mg by mouth daily at 6 PM.    No facility-administered encounter medications on file as of 03/10/2017.     Past Medical History:  Diagnosis Date  . Allergy    rhinitis  . Aortic stenosis, severe   . Aortic stenosis, severe   . Arthritis    spine and various joints  . Cataract   . Childhood asthma   . Chronic bronchitis ([place])       . Claudication ([place])   . Coronary artery disease     a. April 2012 which revealed mid RCA occlusion with collaterals, 50% LAD stenosis, 30% circumflex stenosis, and 40% marginal stenosis  . GERD (gastroesophageal reflux disease)   . Heart murmur   . Hyperlipidemia   . Hypertension   . LUMBAR RADICULOPATHY, RIGHT 12/21/2007   Qualifier: Diagnosis of  By: Aline Brochure MD, Dorothyann Peng    . Peripheral vascular disease ([place])   . 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 Seroma, postoperative    Left Groin  . Severe aortic valve stenosis 08/31/2014  . Stroke [facility]) 1982 X 2   "a little bit weaker on the left side since" (08/29/2014)    Past Surgical History:  Procedure Laterality Date  . AORTO-FEMORAL BYPASS GRAFT Bilateral 06/27/10  . BASAL CELL CARCINOMA EXCISION Left    face  . CARDIAC CATHETERIZATION N/A 07/25/2014   Procedure: Right/Left Heart Cath and Coronary Angiography;  Surgeon: Troy Sine, MD;  Location: [facility];  Service: Cardiovascular;  Laterality: N/A;  . COLONOSCOPY    . DILATION AND CURETTAGE OF UTERUS  "2 or 3"  . MULTIPLE TOOTH EXTRACTIONS    . PR VEIN BYPASS GRAFT,AORTO-FEM-POP  06/12/10  . TEE WITHOUT CARDIOVERSION N/A 08/31/2014   Procedure: TRANSESOPHAGEAL ECHOCARDIOGRAM (TEE);  Surgeon: Rexene Alberts, MD;  Location: [facility];  Service: Open Heart Surgery;  Laterality: N/A;  . TRANSCATHETER AORTIC VALVE REPLACEMENT, TRANSFEMORAL N/A 08/31/2014   Procedure: TRANSCATHETER AORTIC VALVE REPLACEMENT, TRANSFEMORAL approach;  Surgeon: Rexene Alberts, MD;  Location: [facility];  Service: Open Heart Surgery;  Laterality: N/A;  . TUBAL LIGATION  1970's  . VAGINAL HYSTERECTOMY  1970's   Partial     Social History   Socioeconomic History  . Marital status: Married    Spouse name: bobby  . Number of children: 1  . Years of education: Not on file  . Highest education level: High school graduate  Social Needs  .  Financial resource strain: Not very hard  . Food insecurity - worry: Never true  . Food insecurity - inability: Never true  . Transportation needs - medical: No  . Transportation needs - non-medical: No  Occupational History  . 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 Smoking status: Former Smoker    Packs/day: 2.00    Years: 56.00    Pack years: 112.00    Types: Cigarettes    Start date: 03/02/1952    Last attempt to quit: 06/01/2010    Years since quitting: 6.7  . Smokeless tobacco: Never Used  . Tobacco comment: HAD WEANED DOWN TO 1/4 PK A DAY BEFORE D/C  Substance and Sexual Activity  . Alcohol use: No    Alcohol/week: 0.0 oz  Frequency: Never    Comment: 08/29/2014 "never drank much; quit in the 1990's"  . Drug use: No  . Sexual activity: Yes  Other Topics Concern  . Not on file  Social History Narrative   Married to [place] 60 years   Mortimer Fries has 3 daughters   Patient has one daughter, 3 grands   Likes to crochet    Family History  Problem Relation Age of Onset  . Heart disease Mother 58       died young  . Varicose Veins Mother   . Stroke Father   . Hypertension Father   . Heart disease Father 23       Aneurysm  . Heart attack Father   . Arthritis Brother   . Stroke Brother   . Diabetes Daughter   . Arthritis Daughter   . Allergic rhinitis Neg Hx   . Angioedema Neg Hx   . Asthma Neg Hx   . Atopy Neg Hx   . Eczema Neg Hx   . Immunodeficiency Neg Hx   . Urticaria Neg Hx     Review of Systems  Constitutional: Negative for chills, fever and weight loss.  HENT: Negative for congestion and hearing loss.   Eyes: Negative for blurred vision and pain.  Respiratory: Negative for cough and shortness of breath.   Cardiovascular: Negative for chest pain and leg swelling.  Gastrointestinal: Negative for abdominal pain, constipation, diarrhea and heartburn.  Genitourinary: Negative for dysuria and frequency.  Musculoskeletal: Positive for back  pain and joint pain. Negative for falls and myalgias.  Neurological: Negative for dizziness, seizures and headaches.  Psychiatric/Behavioral: Negative for depression. The patient is not nervous/anxious and does not have insomnia.     BP 138/70 (BP Location: Left Arm, Patient Position: Sitting, Cuff Size: Large)   Pulse 60   Temp (!) 97.3 F (36.3 C) (Temporal)   Resp 16   Ht 5\' 9"  (1.753 m)   Wt 193 lb 1.3 oz (87.6 kg)   SpO2 95%   BMI 28.51 kg/m   Physical Exam  Constitutional: She is oriented to person, place, and time. She appears well-developed and well-nourished.  Pleasant elderly woman.  Appears stated age  HENT:  Head: Normocephalic and atraumatic.  Mouth/Throat: Oropharynx is clear and moist.  Eyes: Conjunctivae are normal. Pupils are equal, round, and reactive to light.  Neck: Normal range of motion. Neck supple. No thyromegaly present.  Cardiovascular: Normal rate, regular rhythm and normal heart sounds.  Pulmonary/Chest: Effort normal and breath sounds normal. No respiratory distress.  Abdominal: Soft. Bowel sounds are normal.  Musculoskeletal: Normal range of motion. She exhibits no edema.  Lymphadenopathy:    She has no cervical adenopathy.  Neurological: She is alert and oriented to person, place, and time.  Gait normal  Skin: Skin is warm and dry.  Psychiatric: She has a normal mood and affect. Her behavior is normal. Thought content normal.  Nursing note and vitals reviewed. ASSESSMENT/PLAN:   1. Paroxysmal atrial fibrillation [facility]) Patient states she is unaware of A. fib.  She feels well.  No chest pain.  On Coumadin.  2. Chronic anticoagulation Standing order sent to lab - PT with INR/Fingerstick; Standing  3. Current long-term use of postmenopausal hormone replacement therapy Discussed Estrace.  Patient desires to stay on.  She understands risk  4. Essential hypertension Controlled  5. Hyperlipidemia with target LDL less than 70 Compliant with  Crestor   Patient Instructions  Need old records  See me for  a PE in one month   Raylene Everts, MD

## 2017-03-17 ENCOUNTER — Other Ambulatory Visit: Payer: Self-pay | Admitting: Family Medicine

## 2017-03-22 ENCOUNTER — Encounter (HOSPITAL_COMMUNITY): Payer: Medicare Other

## 2017-03-22 ENCOUNTER — Ambulatory Visit: Payer: Medicare Other | Admitting: Family

## 2017-03-29 ENCOUNTER — Other Ambulatory Visit: Payer: Self-pay

## 2017-03-29 ENCOUNTER — Encounter: Payer: Self-pay | Admitting: Family

## 2017-03-29 ENCOUNTER — Ambulatory Visit (INDEPENDENT_AMBULATORY_CARE_PROVIDER_SITE_OTHER): Payer: Medicare Other | Admitting: Family

## 2017-03-29 ENCOUNTER — Ambulatory Visit (HOSPITAL_COMMUNITY)
Admission: RE | Admit: 2017-03-29 | Discharge: 2017-03-29 | Disposition: A | Discharge status: Home / Self Care | Payer: Medicare Other | Source: Ambulatory Visit | Attending: Family | Admitting: Family

## 2017-03-29 VITALS — BP 140/71 | HR 58 | Temp 97.1°F | Resp 16 | Ht 70.0 in | Wt 191.0 lb

## 2017-03-29 DIAGNOSIS — I7409 Other arterial embolism and thrombosis of abdominal aorta: Secondary | ICD-10-CM | POA: Diagnosis not present

## 2017-03-29 DIAGNOSIS — Z87891 Personal history of nicotine dependence: Secondary | ICD-10-CM | POA: Diagnosis not present

## 2017-03-29 DIAGNOSIS — Z95828 Presence of other vascular implants and grafts: Secondary | ICD-10-CM

## 2017-03-29 DIAGNOSIS — Z8679 Personal history of other diseases of the circulatory system: Secondary | ICD-10-CM

## 2017-03-29 DIAGNOSIS — I7789 Other specified disorders of arteries and arterioles: Secondary | ICD-10-CM | POA: Insufficient documentation

## 2017-03-29 NOTE — Patient Instructions (Signed)

## 2017-03-29 NOTE — Progress Notes (Signed)
[facility] [place]   CC: Follow up peripheral artery occlusive disease  History of Present Illness Katelyn [name] is a 77 y.o. female who is status post aortobifemoral bypass graft in April of 2012 by Dr. Trula Slade. This was done for bilateral claudication. Postoperatively, she had issues with swelling.  She has swelling in both lower legs, left more so than right, which mostly resolve by morning, she still wears compression stockings.   She still has numbness from both groins to her toes as constant and mild.  She does have a history of arthritis pain. Dr. Vertell Limber performed injections in her right hip, per pt.   She had 2 strokes in 1982; Dr. Georgina Peer, her cardiologist, has been monitoring her carotid arteries. She denies any stroke or TIA's since 1982.   Patient states dramatic improvement in ability to walk without claudication symptoms since the above surgery; prior to the aortobifemoral bypass graft she would get claudication symptoms in both thighs and calves after 5 steps, and her toes were turning black.  Now her toes are pink and warm.   Patient's aortic valve was replaced July 2016 with a bovine valve.  Pt states her blood pressure increases in a medical office, that at home it runs 120-140/60-80.  Pt Diabetic: No Pt smoker: former smoker, quit in 2012  Pt meds include:  Statin :Yes Betablocker: Yes ASA: no  Other anticoagulants/antiplatelets: takes coumadin for a history of atrial fib, and since she had strokes, prescribed by Dr. Leighton Roach, her PCP as of the end of 2018 is Dr. Lysle Morales      Past Medical History:  Diagnosis Date  . Allergy    rhinitis  . Aortic stenosis, severe   . Aortic stenosis, severe   . Arthritis    spine and various joints  . Cataract   . Childhood asthma   . Chronic bronchitis ([place])       . Claudication ([place])   . Coronary artery disease    a. April 2012 which revealed mid RCA occlusion with collaterals, 50%  LAD stenosis, 30% circumflex stenosis, and 40% marginal stenosis  . GERD (gastroesophageal reflux disease)   . Heart murmur   . Hyperlipidemia   . Hypertension   . LUMBAR RADICULOPATHY, RIGHT 12/21/2007   Qualifier: Diagnosis of  By: Aline Brochure MD, Dorothyann Peng    . Peripheral vascular disease ([place])   . 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 Seroma, postoperative    Left Groin  . Severe aortic valve stenosis 08/31/2014  . Stroke [facility]) 1982 X 2   "a little bit weaker on the left side since" (08/29/2014)    Social History Social History   Tobacco Use  . Smoking status: Former Smoker    Packs/day: 2.00    Years: 56.00    Pack years: 112.00    Types: Cigarettes    Start date: 03/02/1952    Last attempt to quit: 06/01/2010    Years since quitting: 6.8  . Smokeless tobacco: Never Used  . Tobacco comment: HAD WEANED DOWN TO 1/4 PK A DAY BEFORE D/C  Substance Use Topics  . Alcohol use: No    Alcohol/week: 0.0 oz    Frequency: Never    Comment: 08/29/2014 "never drank much; quit in the 1990's"  . Drug use: No    Family History Family History  Problem Relation Age of Onset  .  Heart disease Mother 56       died young  . Varicose Veins Mother   . Stroke Father   . Hypertension Father   . Heart disease Father 64       Aneurysm  . Heart attack Father   . Arthritis Brother   . Stroke Brother   . Diabetes Daughter   . Arthritis Daughter   . Allergic rhinitis Neg Hx   . Angioedema Neg Hx   . Asthma Neg Hx   . Atopy Neg Hx   . Eczema Neg Hx   . Immunodeficiency Neg Hx   . Urticaria Neg Hx     Past Surgical History:  Procedure Laterality Date  . AORTO-FEMORAL BYPASS GRAFT Bilateral 06/27/10  . BASAL CELL CARCINOMA EXCISION Left    face  . CARDIAC CATHETERIZATION N/A 07/25/2014   Procedure: Right/Left Heart Cath and Coronary Angiography;  Surgeon: Troy Sine, MD;   Location: [facility];  Service: Cardiovascular;  Laterality: N/A;  . CATARACT EXTRACTION, BILATERAL Bilateral 09/29/2016  . COLONOSCOPY    . DILATION AND CURETTAGE OF UTERUS  "2 or 3"  . MULTIPLE TOOTH EXTRACTIONS    . PR VEIN BYPASS GRAFT,AORTO-FEM-POP  06/12/10  . TEE WITHOUT CARDIOVERSION N/A 08/31/2014   Procedure: TRANSESOPHAGEAL ECHOCARDIOGRAM (TEE);  Surgeon: Rexene Alberts, MD;  Location: [facility];  Service: Open Heart Surgery;  Laterality: N/A;  . TRANSCATHETER AORTIC VALVE REPLACEMENT, TRANSFEMORAL N/A 08/31/2014   Procedure: TRANSCATHETER AORTIC VALVE REPLACEMENT, TRANSFEMORAL approach;  Surgeon: Rexene Alberts, MD;  Location: [facility];  Service: Open Heart Surgery;  Laterality: N/A;  . TUBAL LIGATION  1970's  . VAGINAL HYSTERECTOMY  1970's   Partial     Allergies  Allergen Reactions  . Adhesive [Tape] Rash  . Latex Rash  . Tetanus Toxoids Rash    Current Outpatient Medications  Medication Sig Dispense Refill  . amLODipine (NORVASC) 5 MG tablet take 1 tablet by mouth once daily 90 tablet 3  . cetirizine (ZYRTEC) 10 MG tablet Take 10 mg by mouth daily.      Marland Kitchen estradiol (ESTRACE) 2 MG tablet     . furosemide (LASIX) 20 MG tablet Take 20 mg by mouth as needed (fluid retention).     Marland Kitchen ipratropium (ATROVENT) 0.06 % nasal spray Place 1 spray into both nostrils 4 (four) times daily. 15 mL 3  . irbesartan (AVAPRO) 150 MG tablet Take 1 tablet (150 mg total) by mouth daily. 90 tablet 3  . metoprolol succinate (TOPROL-XL) 25 MG 24 hr tablet Take 3 tablets (75 mg total) by mouth daily. 90 tablet 6  . nitroGLYCERIN (NITROSTAT) 0.4 MG SL tablet Place 1 tablet (0.4 mg total) under the tongue every 5 (five) minutes as needed for chest pain. 25 tablet 6  . potassium chloride (KLOR-CON) 10 MEQ CR tablet Take 10 mEq by mouth as needed (when taking Lasix).     . rosuvastatin (CRESTOR) 20 MG tablet Take 20 mg by mouth daily.      Marland Kitchen warfarin (COUMADIN) 3 MG tablet Take 3 mg by mouth daily at 6  PM.     . Azelastine-Fluticasone 137-50 MCG/ACT SUSP Place 2 sprays into the nose as directed.     No current facility-administered medications for this visit.     ROS: See HPI for pertinent positives and negatives.   Physical Examination  Vitals:   03/29/17 0929  BP: 140/71  Pulse: (!) 58  Resp: 16  Temp: (!) 97.1 F (  36.2 C)  TempSrc: Oral  SpO2: 97%  Weight: 191 lb (86.6 kg)  Height: 5\' 10"  (1.778 m)   Body mass index is 27.41 kg/m.  General:A&O x 3, WDWN female Gait: normal HENT: No gross abnormalities  Eyes: PERRLA, Pulmonary: Respirations are non labored, CTAB, without wheezes, no rales or rhonchi. Cardiac: regular rhythm with occasional premature contraction,no detected murmur     Carotid Bruits Left Right   negative negative   Abdominal aortic pulse is not palpable. Radial pulses: 2+ and equal   VASCULAR EXAM: Extremitieswithout ischemic changes  without Gangrene; without open wounds. Trace bilateral ankle edema, knee high compression hose on.      LE Pulses LEFT RIGHT   FEMORAL  2+ palpable  2+palpable    POPLITEAL not palpable  not palpable   POSTERIOR TIBIAL not  palpable   not palpable    DORSALIS PEDIS  ANTERIOR TIBIAL not palpable  not palpable    Abdomen: soft, NT, no palpable masses. Skin: no rashes, no ulcers, no cellulitis. Musculoskeletal: no muscle wasting or atrophy. Neurologic: A&O X 3; appropriate affect, sensation is normal; MOTOR FUNCTION: moving all extremities equally, motor strength 5/5 throughout. Speech is fluent/normal. CN 2-12 intact Psychiatric: Thought content is normal, mood appropriate for clinical situation.     ASSESSMENT: Katelyn [name] is a 77 y.o. female who  is status post aortobifemoral bypass graft in April of 2012 and had dramatic resolution of her severe bilateral claudication symptoms since this surgery. There are no signs of ischemia in her feet/legs.   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 She has had no stroke or TIA since 1982.   DATA  ABI (Date: 03/29/2017):  R:   ABI: 1.22 (was 1.03 on 03-16-16),   PT: Waveform morphology not documented (was triphasic)  DP: Waveform morphology not documented (was triphasic)  TBI:  0.69 (was 0.67)  L:   ABI: 1.12 (was 1.11),   PT: Waveform morphology not documented (was triphasic)  DP: Waveform morphology not documented (was triphasic)   TBI: 0.60 (was 0.58)  Bilateral ABI remain normal.    May, 2018 Carotid Duplex requested by Dr. Karie Kirks: Heterogeneous plaque, bilaterally. 60-79% stenosis in the right ICA which has increased from prior exam. 1-39% stenosis in the left ICA. >50% stenosis in the left ECA. Normal subclavian arteries, bilaterally. Patent vertebral arteries with antegrade flow. f/u 1 year   PLAN:  Walk at least 30 minutes daily, at a moderate pace or higher.   Based on the patient's vascular studies and examination, pt will returnto clinic in 1 year for ABI's.  I advised her to notify our office if she develops concerns re the circulation in her feet/legs.    I discussed in depth with the patient the nature of atherosclerosis, and emphasized the importance of maximal medical management including strict control of blood pressure, blood glucose, and lipid levels, obtaining regular exercise, and continued cessation of smoking.  The patient is aware that without maximal medical management the underlying atherosclerotic disease process will progress, limiting the benefit of any interventions.  The patient was given information about PAD including signs, symptoms, treatment, what symptoms should prompt the patient to seek  immediate medical care, and risk reduction measures to take.  Clemon Chambers, RN, MSN, FNP-C [facility] Phone: [phone]  Clinic MD: Trula Slade  03/29/17 9:41 AM

## 2017-04-12 ENCOUNTER — Encounter: Payer: Medicare Other | Admitting: Family Medicine

## 2017-04-13 ENCOUNTER — Ambulatory Visit (INDEPENDENT_AMBULATORY_CARE_PROVIDER_SITE_OTHER): Payer: Medicare Other | Admitting: Family Medicine

## 2017-04-13 ENCOUNTER — Encounter: Payer: Self-pay | Admitting: Family Medicine

## 2017-04-13 ENCOUNTER — Other Ambulatory Visit: Payer: Self-pay

## 2017-04-13 VITALS — BP 134/68 | HR 60 | Temp 97.2°F | Resp 16 | Ht 70.0 in | Wt 194.1 lb

## 2017-04-13 DIAGNOSIS — R1319 Other dysphagia: Secondary | ICD-10-CM | POA: Insufficient documentation

## 2017-04-13 DIAGNOSIS — K219 Gastro-esophageal reflux disease without esophagitis: Secondary | ICD-10-CM | POA: Diagnosis not present

## 2017-04-13 DIAGNOSIS — I480 Paroxysmal atrial fibrillation: Secondary | ICD-10-CM

## 2017-04-13 DIAGNOSIS — E785 Hyperlipidemia, unspecified: Secondary | ICD-10-CM

## 2017-04-13 DIAGNOSIS — E559 Vitamin D deficiency, unspecified: Secondary | ICD-10-CM | POA: Diagnosis not present

## 2017-04-13 DIAGNOSIS — I10 Essential (primary) hypertension: Secondary | ICD-10-CM

## 2017-04-13 DIAGNOSIS — R1310 Dysphagia, unspecified: Secondary | ICD-10-CM | POA: Diagnosis not present

## 2017-04-13 DIAGNOSIS — Z7901 Long term (current) use of anticoagulants: Secondary | ICD-10-CM

## 2017-04-13 MED ORDER — ROSUVASTATIN CALCIUM 20 MG PO TABS: 20.0000 mg | ORAL_TABLET | Freq: Every day | ORAL | 0 refills | Status: DC

## 2017-04-13 NOTE — Progress Notes (Signed)
Chief Complaint  Patient presents with  . Annual Exam   Patient is here for her annual examination. She had a mammogram last fall. DEXA scan is up-to-date. She has had 2 colonoscopies, both normal.  She does not think she wants to have another.  I will get those reports and see if they were truly negative, she may benefit from cologuard. Patient immunizations are up-to-date. She complains today of acid reflux.  She states that when she lays down acid washes up to the back of her throat.  She takes Tums every night.  She is never had this evaluated.  She does not take any antiacid pills like Zantac or omeprazole over-the-counter.  She feels like she gets good enough control with her Tums.  She does have difficulty swallowing.  She periodically feels like food gets stuck and she points to the center of her chest.  She states that when this happens is very painful and she has to get up and walk the floor.  Usually this will go away within a matter of minutes.  She states is upsetting to her husband.  I told her that this is a symptom that requires additional evaluation.  Would refer her to gastroenterology and feel that she needs an EGD. She is under the care of cardiology for her atrial fibrillation.  She is due for laboratory work today.  I am going to add a PT/INR.  Patient Active Problem List   Diagnosis Date Noted  . Gastroesophageal reflux disease 04/13/2017  . Esophageal dysphagia 04/13/2017  . Chronic anticoagulation 03/10/2017  . Current long-term use of postmenopausal hormone replacement therapy 03/10/2017  . Non-allergic rhinitis 11/10/2016  . Paroxysmal atrial fibrillation ([place]) 02/13/2016  . 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 Tachycardia-bradycardia syndrome ([place])   . Atrial fibrillation with RVR ([place]) 08/29/2014  . LBBB (left bundle branch block), chronic 08/29/2014  . CAD in native artery   . Peripheral vascular disease ([place]) 12/13/2012  .  Essential hypertension 12/13/2012  . Hyperlipidemia with target LDL less than 70 12/13/2012  . Atherosclerosis of native arteries of extremity with intermittent claudication ([place]) 08/10/2011    Outpatient Encounter Medications as of 04/13/2017  Medication Sig  . amLODipine (NORVASC) 5 MG tablet take 1 tablet by mouth once daily  . Azelastine-Fluticasone 137-50 MCG/ACT SUSP Place 2 sprays into the nose as directed.  . cetirizine (ZYRTEC) 10 MG tablet Take 10 mg by mouth daily.    Marland Kitchen estradiol (ESTRACE) 2 MG tablet   . furosemide (LASIX) 20 MG tablet Take 20 mg by mouth as needed (fluid retention).   Marland Kitchen ipratropium (ATROVENT) 0.06 % nasal spray Place 1 spray into both nostrils 4 (four) times daily.  . irbesartan (AVAPRO) 150 MG tablet Take 1 tablet (150 mg total) by mouth daily.  . metoprolol succinate (TOPROL-XL) 25 MG 24 hr tablet Take 3 tablets (75 mg total) by mouth daily.  . nitroGLYCERIN (NITROSTAT) 0.4 MG SL tablet Place 1 tablet (0.4 mg total) under the tongue every 5 (five) minutes as needed for chest pain.  . potassium chloride (KLOR-CON) 10 MEQ CR tablet Take 10 mEq by mouth as needed (when taking Lasix).   . rosuvastatin (CRESTOR) 20 MG tablet Take 1 tablet (20 mg total) by mouth daily.  Marland Kitchen warfarin (COUMADIN) 3 MG tablet Take 3 mg by mouth daily at 6 PM.   . [DISCONTINUED] rosuvastatin (CRESTOR) 20 MG tablet Take 20 mg by mouth daily.  No facility-administered encounter medications on file as of 04/13/2017.     Allergies  Allergen Reactions  . Adhesive [Tape] Rash  . Latex Rash  . Tetanus Toxoids Rash    Review of Systems  Constitutional: Negative for activity change, appetite change and unexpected weight change.  HENT: Positive for trouble swallowing. Negative for congestion, dental problem, postnasal drip and rhinorrhea.   Eyes: Negative for redness and visual disturbance.  Respiratory: Negative for cough and shortness of breath.   Cardiovascular: Negative for chest  pain, palpitations and leg swelling.  Gastrointestinal: Positive for abdominal distention and abdominal pain. Negative for constipation and diarrhea.       GERD, bloating, "gas"  Genitourinary: Negative for difficulty urinating and frequency.  Musculoskeletal: Negative for arthralgias and back pain.  Neurological: Negative for dizziness and headaches.  Psychiatric/Behavioral: Negative for dysphoric mood and sleep disturbance. The patient is not nervous/anxious.     BP 134/68 (BP Location: Left Arm, Patient Position: Sitting, Cuff Size: Large)   Pulse 60   Temp (!) 97.2 F (36.2 C) (Temporal)   Resp 16   Ht 5\' 10"  (1.778 m)   Wt 194 lb 1.9 oz (88.1 kg)   BMI 27.85 kg/m   Physical Exam   BP 134/68 (BP Location: Left Arm, Patient Position: Sitting, Cuff Size: Large)   Pulse 60   Temp (!) 97.2 F (36.2 C) (Temporal)   Resp 16   Ht 5\' 10"  (1.778 m)   Wt 194 lb 1.9 oz (88.1 kg)   BMI 27.85 kg/m   General Appearance:    Alert, cooperative, no distress, appears stated age  Head:    Normocephalic, without obvious abnormality, atraumatic  Eyes:    PERRL, conjunctiva/corneas clear, EOM's intact, fundi    benign, both eyes  Ears:    Normal TM's and external ear canals, both ears  Nose:   Nares normal, septum midline, mucosa normal, no drainage    or sinus tenderness  Throat:   Lips, mucosa, and tongue normal; teeth well repaired, partial plates, and gums normal  Neck:   Supple, symmetrical, trachea midline, no adenopathy;    thyroid:  no enlargement/tenderness/nodules; no carotid   bruit   Back:     Symmetric, no curvature, ROM normal, no CVA tenderness  Lungs:     Clear to auscultation bilaterally, respirations unlabored  Chest Wall:    No tenderness or deformity   Heart:    Regular rate and rhythm, S1 and S2 normal, prominent systolic murmur, no rub   or gallop  Breast Exam:    No tenderness, masses, or nipple abnormality  Abdomen:     Soft, non-tender, bowel sounds active all  four quadrants,    no masses, no organomegaly.large central scar from peripheral vascular surgery.  Mild tenderness deep epigastric palpation  Extremities:   Extremities normal, atraumatic, no cyanosis or edema  Pulses:  Absent in feet, 2+ in upper and symmetric extremities  Skin:   Skin color, texture, turgor normal, no rashes or lesions.  Hyperpigmentation of feet from chronic venous insufficiency.  Dystrophic great toenails bilaterally with pincer deformities  Lymph nodes:   Cervical, supraclavicular, and axillary nodes normal  Neurologic:   CNII-XII intact, normal strength, sensation and reflexes    throughout     ASSESSMENT/PLAN:  1. Paroxysmal atrial fibrillation ([place]) Under care of cardiology.  On Coumadin - COMPLETE METABOLIC PANEL WITH GFR - CBC - Lipid panel - TSH - Urinalysis, Routine w reflex microscopic - VITAMIN D 25  Hydroxy (Vit-D Deficiency, Fractures)  2. Chronic anticoagulation - INR/PT  3. Essential hypertension Well-controlled  4. Hyperlipidemia with target LDL less than 70 Controlled by history.  Check lipids  5. Vitamin D deficiency Not on replacement.  Will check vitamin D level  6. Esophageal dysphagia New complaint - Ambulatory referral to Gastroenterology  7. Gastroesophageal reflux disease, esophagitis presence not specified New complaint. - Ambulatory referral to Gastroenterology 8.  Annual physical examination  Patient Instructions  Labs today No change in medicines Need colonoscopy record  See me in 3 months    Raylene Everts, MD

## 2017-04-13 NOTE — Patient Instructions (Signed)
Labs today No change in medicines Need colonoscopy record  See me in 3 months

## 2017-04-14 ENCOUNTER — Encounter: Payer: Self-pay | Admitting: Cardiovascular Disease

## 2017-04-14 ENCOUNTER — Ambulatory Visit (INDEPENDENT_AMBULATORY_CARE_PROVIDER_SITE_OTHER): Payer: Medicare Other | Admitting: Cardiovascular Disease

## 2017-04-14 VITALS — BP 124/74 | HR 77 | Ht 70.0 in | Wt 193.4 lb

## 2017-04-14 DIAGNOSIS — Z7901 Long term (current) use of anticoagulants: Secondary | ICD-10-CM

## 2017-04-14 DIAGNOSIS — E559 Vitamin D deficiency, unspecified: Secondary | ICD-10-CM

## 2017-04-14 DIAGNOSIS — I2510 Atherosclerotic heart disease of native coronary artery without angina pectoris: Secondary | ICD-10-CM | POA: Diagnosis not present

## 2017-04-14 DIAGNOSIS — I10 Essential (primary) hypertension: Secondary | ICD-10-CM | POA: Diagnosis not present

## 2017-04-14 DIAGNOSIS — Z952 Presence of prosthetic heart valve: Secondary | ICD-10-CM | POA: Diagnosis not present

## 2017-04-14 DIAGNOSIS — I480 Paroxysmal atrial fibrillation: Secondary | ICD-10-CM

## 2017-04-14 LAB — URINALYSIS, ROUTINE W REFLEX MICROSCOPIC
Bacteria, UA: NONE SEEN /HPF
Bilirubin Urine: NEGATIVE
GLUCOSE, UA: NEGATIVE
HYALINE CAST: NONE SEEN /LPF
Ketones, ur: NEGATIVE
Leukocytes, UA: NEGATIVE
Nitrite: NEGATIVE
Protein, ur: NEGATIVE
SPECIFIC GRAVITY, URINE: 1.02 (ref 1.001–1.03)
WBC UA: NONE SEEN /HPF (ref 0–5)

## 2017-04-14 LAB — CBC
HEMATOCRIT: 43.8 % (ref 35.0–45.0)
HEMOGLOBIN: 15 g/dL (ref 11.7–15.5)
MCH: 30.8 pg (ref 27.0–33.0)
MCHC: 34.2 g/dL (ref 32.0–36.0)
MCV: 89.9 fL (ref 80.0–100.0)
MPV: 11.7 fL (ref 7.5–12.5)
Platelets: 125 10*3/uL — ABNORMAL LOW (ref 140–400)
RBC: 4.87 10*6/uL (ref 3.80–5.10)
RDW: 12.9 % (ref 11.0–15.0)
WBC: 5.7 10*3/uL (ref 3.8–10.8)

## 2017-04-14 LAB — LIPID PANEL
CHOL/HDL RATIO: 2.7 (calc) (ref <5.0)
Cholesterol: 124 mg/dL (ref <200)
HDL: 46 mg/dL — ABNORMAL LOW (ref >50)
LDL CHOLESTEROL (CALC): 50 mg/dL
Non-HDL Cholesterol (Calc): 78 mg/dL (calc) (ref <130)
TRIGLYCERIDES: 214 mg/dL — AB (ref <150)

## 2017-04-14 LAB — COMPLETE METABOLIC PANEL WITH GFR
AG RATIO: 1.5 (calc) (ref 1.0–2.5)
ALT: 12 U/L (ref 6–29)
AST: 16 U/L (ref 10–35)
Albumin: 4 g/dL (ref 3.6–5.1)
Alkaline phosphatase (APISO): 87 U/L (ref 33–130)
BUN/Creatinine Ratio: 20 (calc) (ref 6–22)
BUN: 20 mg/dL (ref 7–25)
CALCIUM: 8.8 mg/dL (ref 8.6–10.4)
CO2: 29 mmol/L (ref 20–32)
CREATININE: 1.01 mg/dL — AB (ref 0.60–0.93)
Chloride: 105 mmol/L (ref 98–110)
GFR, EST AFRICAN AMERICAN: 64 mL/min/{1.73_m2} (ref >=60)
GFR, EST NON AFRICAN AMERICAN: 55 mL/min/{1.73_m2} — AB (ref >=60)
Globulin: 2.6 g/dL (calc) (ref 1.9–3.7)
Glucose, Bld: 90 mg/dL (ref 65–139)
POTASSIUM: 4.8 mmol/L (ref 3.5–5.3)
Sodium: 138 mmol/L (ref 135–146)
TOTAL PROTEIN: 6.6 g/dL (ref 6.1–8.1)
Total Bilirubin: 0.6 mg/dL (ref 0.2–1.2)

## 2017-04-14 LAB — VITAMIN D 25 HYDROXY (VIT D DEFICIENCY, FRACTURES): Vit D, 25-Hydroxy: 7 ng/mL — ABNORMAL LOW (ref 30–100)

## 2017-04-14 LAB — PROTIME-INR
INR: 2.6 — ABNORMAL HIGH
PROTHROMBIN TIME: 27.3 s — AB (ref 9.0–11.5)

## 2017-04-14 LAB — TSH: TSH: 3.33 m[IU]/L (ref 0.40–4.50)

## 2017-04-14 MED ORDER — METOPROLOL SUCCINATE ER 100 MG PO TB24: 100.0000 mg | ORAL_TABLET | Freq: Every day | ORAL | 3 refills | Status: DC

## 2017-04-14 MED ORDER — VITAMIN D (ERGOCALCIFEROL) 1.25 MG (50000 UNIT) PO CAPS: 50000.0000 [IU] | ORAL_CAPSULE | ORAL | 0 refills | Status: DC

## 2017-04-14 NOTE — Progress Notes (Signed)
Patient ID: SHANON SEAWRIGHT, female   DOB: 11-28-1940, 77 y.o.   MRN: [id number]    PCP: Dr. Newt Minion HPI: TYCHELLE PURKEY is a 77 y.o. female who presents to the office for a 6 month follow-up cardiology evaluation  Mrs. Gruenhagen has known CAD and underwent initial cardiac catheterization in  April 2012 which revealed mid RCA occlusion with collaterals, 50% LAD stenosis, 30% circumflex stenosis, and 40% marginal stenosis. 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 Trula Slade. She has a history of hypertension with grade 2 diastolic dysfunction, hyperlipidemia, and has developed moderately severe aortic valve stenosis. An echo Doppler study in May 2013 showed a mean transvalvular aortic gradient of 34 with a maximum of 56. In December 2013, her mean gradient was 32 and peak 56 with a valve area of 0.84. An echo Doppler study on 12/06/2012 continued to show normal systolic function with an ejection fraction of 60-65%. She had grade 1 diastolic dysfunction. Peak gradients were  61 mm and mean gradient was 38 mm.  The aortic valve area 0.8-0.9 cm. She had mild to moderate aortic insufficiency. Had mildly thickened mitral valve leaflets with mild MR. The left atrium was mildly dilated. She had mild TR. Pulmonary pressure was mildly increased at 33 mm.  A follow-up echo Doppler study on 02/12/2014  Showed normal EF at 55-60%. Her aortic valve was severely calcified and stenosis was felt to be severe with a mean gradient of 42, a peak gradient of 64 which had slightly increaed from 38 and 61 mm, respectively.  She began to develop exertional chest discomfort with associated dyspnea and fatigue.  She underwent repeat cardiac catheterization on 07/25/2014 which showed a chronically occluded RCA with left-to-right collaterals, a 70% diagonal stenosis, 50% ostial circumflex  stenosis and 40% AV groove circumflex stenosis.  Her aortic stenosis at further progressed such that she had a mean gradient of 42 mm.  She was felt to be a poor candidate for conventional aortic valve replacement and on 08/31/2014 underwent successful  TAVR with placement of a 26 mm Edward Sapien 3 valve from the right femoral approach by Drs. Roxy Manns and [place].  She developed postoperative atrial fibrillation for which she was transiently treated with amiodarone and when seen for office follow-up she was back in sinus rhythm.  She has noticed improvement in energy.  She is unaware of recurrent atrial fibrillation.  She denies recurrent chest tightness.  She at times is fatigued.  A subsequent echo on 10/01/2014 showed an EF of 60-65% with a mean aortic gradient of 16 mm and peak gradient 26 with trivial aortic insufficiency.    A follow-up echo Doppler study on 09/09/2015 showed an EF of 60-65% and grade 2 diastolic dysfunction.  There were no wall motion abnormalities.  Her 26 mm Edward's Sapien 3 valve was well-seated from her TAVR procedure.  The mean valve gradient was 16 mm.  There was no evidence for perivalvular leak.  There was mitral annular calcification with mild MR, mild LA dilatation and mild dilation of her right ventricle.  There is mild coronary hypertension with a PA pressure 33 mm.    When I  saw her in October 2017, her dose of valsartan had been reduced prior to the office visit by Dr. Karie Kirks.  At that time, she was having more frequent palpitations and had trigeminy on her ECG.  With her being on reduce valsartan.  I recommended further titration of her beta blocker and increased her Toprol to 50 mg daily.    When I saw her in December 2017 her blood pressure was significantly elevated.  Her resting pulse was 57.  She was no longer taking valsartan and I added amlodipine 5 mg for more optimal blood pressure control.  She  underwent lower extremity Doppler studies on 03/16/2016 by  [place] which revealed normal ABIs bilaterally. When I saw her in f/u on 03/31/16 BP was improved but was in atrial fibrillation which was new.  She has continued to be on warfarin anticoagulation and her INR was therapeutic.  I further titrated Toprol-XL to 75 mg for 5 days and recommended that she increase it to 100 mg depending upon her heart rate response.  She did take 100 mg, she did experience some dizziness and as result, self reduced her back to 75 mg daily.  She is unaware of her rhythm being irregular.  In March 2018 her blood pressure was stable.  On 09/09/2016, she underwent a one-year follow-up echo Doppler study.  This showed mild LVH with normal systolic function.  There was grade 1 diastolic dysfunction.  The TAVR bioprosthesis was functioning well.  She had mild LA dilation and mild pulmonary hypertension at 40 mm.  She recently underwent lab work in May by Dr. Karie Kirks.  After she left the office.  I was able to obtain these results.  This showed a cholesterol of 128 and LDL of 54, HDL 49, and triglycerides of 168.  Her creatinine was 1.0.  Hemoglobin 15.7, hematocrit 45.8.  She had recently been notified by her pharmacy that the valsartan that she was taken was recently recalled.  When I last saw her, she had stopped taking the valsartan.  I changed her to irbesartan 150 mg.  Since I last saw her, she was seen by Dr. Albesa Seen, for primary care in [place] earlier this week.  Her INR was therapeutic at 2.6.  She has been on amlodipine 5 mg, furosemide as needed, irbesartan 150 mg, and Toprol 25 mg.  She also continues to be on rosuvastatin 20 mg and warfarin. Laboratory from every 12 2019 showed a creatinine of 1.01.  TSH was pending.  Hemoglobin 15, hematocrit 43, but platelets were decreased at 1 25,000.  Total cholesterol is 124, HDL 46, triglycerides 214, and LDL 50.  Vitamin D was very low at 7.  She presents for evaluation.  Past Medical History:  Diagnosis Date  . Allergy     rhinitis  . Aortic stenosis, severe   . Aortic stenosis, severe   . Arthritis    spine and various joints  . Cataract   . Childhood asthma   . Chronic bronchitis ([place])       . Claudication ([place])   . Coronary artery disease    a. April 2012 which revealed mid RCA occlusion with collaterals, 50% LAD stenosis, 30% circumflex stenosis, and 40% marginal stenosis  . GERD (gastroesophageal reflux disease)   . Heart murmur   . Hyperlipidemia   . Hypertension   . LUMBAR RADICULOPATHY, RIGHT 12/21/2007   Qualifier: Diagnosis of  By: Aline Brochure MD, Dorothyann Peng    . Peripheral vascular disease ([place])   . 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 Seroma, postoperative    Left Groin  . Severe aortic  valve stenosis 08/31/2014  . Stroke [facility]) 1982 X 2   "a little bit weaker on the left side since" (08/29/2014)    Past Surgical History:  Procedure Laterality Date  . AORTO-FEMORAL BYPASS GRAFT Bilateral 06/27/10  . BASAL CELL CARCINOMA EXCISION Left    face  . CARDIAC CATHETERIZATION N/A 07/25/2014   Procedure: Right/Left Heart Cath and Coronary Angiography;  Surgeon: Troy Sine, MD;  Location: [place] [facility];  Service: Cardiovascular;  Laterality: N/A;  . CATARACT EXTRACTION, BILATERAL Bilateral 09/29/2016  . COLONOSCOPY    . DILATION AND CURETTAGE OF UTERUS  "2 or 3"  . MULTIPLE TOOTH EXTRACTIONS    . PR VEIN BYPASS GRAFT,AORTO-FEM-POP  06/12/10  . TEE WITHOUT CARDIOVERSION N/A 08/31/2014   Procedure: TRANSESOPHAGEAL ECHOCARDIOGRAM (TEE);  Surgeon: Rexene Alberts, MD;  Location: [place];  Service: Open Heart Surgery;  Laterality: N/A;  . TRANSCATHETER AORTIC VALVE REPLACEMENT, TRANSFEMORAL N/A 08/31/2014   Procedure: TRANSCATHETER AORTIC VALVE REPLACEMENT, TRANSFEMORAL approach;  Surgeon: Rexene Alberts, MD;  Location: [place];  Service: Open Heart Surgery;  Laterality: N/A;  . TUBAL  LIGATION  1970's  . VAGINAL HYSTERECTOMY  1970's   Partial     Allergies  Allergen Reactions  . Adhesive [Tape] Rash  . Latex Rash  . Tetanus Toxoids Rash    Current Outpatient Medications  Medication Sig Dispense Refill  . amLODipine (NORVASC) 5 MG tablet take 1 tablet by mouth once daily 90 tablet 3  . Azelastine-Fluticasone 137-50 MCG/ACT SUSP Place 2 sprays into the nose as directed.    . cetirizine (ZYRTEC) 10 MG tablet Take 10 mg by mouth daily.      Marland Kitchen estradiol (ESTRACE) 2 MG tablet     . furosemide (LASIX) 20 MG tablet Take 20 mg by mouth as needed (fluid retention).     Marland Kitchen ipratropium (ATROVENT) 0.06 % nasal spray Place 1 spray into both nostrils 4 (four) times daily. 15 mL 3  . irbesartan (AVAPRO) 150 MG tablet Take 1 tablet (150 mg total) by mouth daily. 90 tablet 3  . metoprolol succinate (TOPROL-XL) 100 MG 24 hr tablet Take 1 tablet (100 mg total) by mouth daily. 90 tablet 3  . nitroGLYCERIN (NITROSTAT) 0.4 MG SL tablet Place 1 tablet (0.4 mg total) under the tongue every 5 (five) minutes as needed for chest pain. 25 tablet 6  . potassium chloride (KLOR-CON) 10 MEQ CR tablet Take 10 mEq by mouth as needed (when taking Lasix).     . rosuvastatin (CRESTOR) 20 MG tablet Take 1 tablet (20 mg total) by mouth daily. 90 tablet 0  . warfarin (COUMADIN) 3 MG tablet Take 3 mg by mouth daily at 6 PM.     . Vitamin D, Ergocalciferol, (DRISDOL) 50000 units CAPS capsule Take 1 capsule (50,000 Units total) by mouth every 7 (seven) days. 14 capsule 0   No current facility-administered medications for this visit.     Social History   Socioeconomic History  . Marital status: Married    Spouse name: bobby  . Number of children: 1  . Years of education: Not on file  . Highest education level: High school graduate  Social Needs  . Financial resource strain: Not very hard  . Food insecurity - worry: Never true  . Food insecurity - inability: Never true  . Transportation needs -  medical: No  . Transportation needs - non-medical: No  Occupational History  . 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 Smoking status: Former Smoker    Packs/day: 2.00    Years: 56.00    Pack years: 112.00    Types: Cigarettes    Start date: 03/02/1952    Last attempt to quit: 06/01/2010    Years since quitting: 6.8  . Smokeless tobacco: Never Used  . Tobacco comment: HAD WEANED DOWN TO 1/4 PK A DAY BEFORE D/C  Substance and Sexual Activity  . Alcohol use: No    Alcohol/week: 0.0 oz    Frequency: Never    Comment: 08/29/2014 "never drank much; quit in the 1990's"  . Drug use: No  . Sexual activity: Yes  Other Topics Concern  . Not on file  Social History Narrative   Married to [place] 37 years   Mortimer Fries has 3 daughters   Patient has one daughter, 3 grands   Likes to crochet    Family History  Problem Relation Age of Onset  . Heart disease Mother 51       died young  . Varicose Veins Mother   . Stroke Father   . Hypertension Father   . Heart disease Father 23       Aneurysm  . Heart attack Father   . Arthritis Brother   . Stroke Brother   . Diabetes Daughter   . Arthritis Daughter   . Allergic rhinitis Neg Hx   . Angioedema Neg Hx   . Asthma Neg Hx   . Atopy Neg Hx   . Eczema Neg Hx   . Immunodeficiency Neg Hx   . Urticaria Neg Hx    Social history is notable that she is married has one child. She quit tobacco in April 2012 after a long-standing tobacco history having started smoking at age 48.   ROS General: Negative; No fevers, chills, or night sweats;  HEENT: Negative; No changes in vision or hearing, sinus congestion, difficulty swallowing Pulmonary: Negative; No cough, wheezing, shortness of breath, hemoptysis Cardiovascular: See history of present illness.   GI: Negative; No nausea, vomiting, diarrhea, or abdominal pain GU: Negative; No dysuria, hematuria, or difficulty voiding Musculoskeletal: Negative; no myalgias, joint pain, or  weakness Hematologic/Oncology: Negative; no easy bruising, bleeding Endocrine: Negative; no heat/cold intolerance; no diabetes Neuro: Negative; no changes in balance, headaches Skin: Negative; No rashes or skin lesions Psychiatric: Negative; No behavioral problems, depression Sleep: Negative; No snoring, daytime sleepiness, hypersomnolence, bruxism, restless legs, hypnogognic hallucinations, no cataplexy Other comprehensive 14 point system review is negative.   PE BP 124/74 (BP Location: Left Arm, Patient Position: Sitting)   Pulse 77   Ht _0  (1.778 m)   Wt 193 lb 6.4 oz (87.7 kg)   SpO2 96%   BMI 27.75 kg/m    Repeat blood pressure by me was 122/76  Wt Readings from Last 3 Encounters:  04/14/17 193 lb 6.4 oz (87.7 kg)  04/13/17 194 lb 1.9 oz (88.1 kg)  03/29/17 191 lb (86.6 kg)   General: Alert, oriented, no distress.  Skin: normal turgor, no rashes, warm and dry HEENT: Normocephalic, atraumatic. Pupils equal round and reactive to light; sclera anicteric; extraocular muscles intact; Nose without nasal septal hypertrophy Mouth/Parynx benign; Mallinpatti scale 3 Neck: No JVD, no carotid bruits; normal carotid upstroke Lungs: clear to ausculatation and percussion; no wheezing or rales Chest wall: without tenderness to palpitation Heart: PMI not displaced, rhythm appeared irregular, irregular with a rate in the 70s, s1 s2 normal, 2/6 systolic murmur, no diastolic murmur, no rubs, gallops, thrills,  or heaves Abdomen: soft, nontender; no hepatosplenomehaly, BS+; abdominal aorta nontender and not dilated by palpation. Back: no CVA tenderness Pulses 2+ Musculoskeletal: full range of motion, normal strength, no joint deformities Extremities: no clubbing cyanosis or edema, Homan's sign negative  Neurologic: grossly nonfocal; Cranial nerves grossly wnl Psychologic: Normal mood and affect  ECG (independently read by me): Atrial fibrillation at 77 bpm left bundle branch block.   Left axis deviation.  QTc interval 520 ms  August 2018 ECG (independently read by me): Sinus bradycardia with occasional PACs.  Left axis deviation.  Borderline first-degree AV block with a PR interval of 22 ms.  Left bundle-branch block  March 2018 ECG (independently read by me): Sinus bradycardia 52 bpm.  First-degree AV block with a PR interval of 210 ms.  Left bundle branch block with repolarization changes.  PE the C.  January 2018 ECG (independently read by me): Atrial fibrillation with a rate at 91.  Left bundle-branch block.  December 2017 ECG (independently read by me): Sinus bradycardia 57 bpm.  Left bundle branch block with repolarization changes.  October 2017 ECG (independently read by me): Sinus rhythm at 74 bpm with frequent PVCs in a trigeminal pattern.  March 2017 ECG (independently read by me): Sinus bradycardia 54 bpm.  Left bundle branch block with repolarization changes.  November 2016 ECG (independently read by me): Sinus bradycardia 55 bpm with occasional premature atrial complex.  Left bundle-branch block.  February 2016 ECG (independently read by me):  Sinus bradycardia 57 bpm.  Left bundle branch block with repolarization changes..  No ectopy.  December 2015 ECG (independently read by me): Sinus bradycardia 58 bpm.  Mild left atrial enlargement.  Left axis deviation.  Left bundle branch block with repolarization changes.  October 2014 ECG: Sinus bradycardia with left axis deviation. Left bundle branch block with repolarization changes  LAB BMP Latest Ref Rng & Units 04/13/2017 10/22/2016 04/20/2016  Glucose 65 - 139 mg/dL 90 117(H) 99  BUN 7 - 25 mg/dL _0 Creatinine 0.60 - 0.93 mg/dL 1.01(H) 1.18(H) 1.14(H)  BUN/Creat Ratio 6 - 22 (calc) 20 - -  Sodium 135 - 146 mmol/L 138 140 141  Potassium 3.5 - 5.3 mmol/L 4.8 4.8 4.6  Chloride 98 - 110 mmol/L 105 105 106  CO2 20 - 32 mmol/L _1 Calcium 8.6 - 10.4 mg/dL 8.8 9.0 8.9   Hepatic Function Latest Ref  Rng & Units 04/13/2017 10/22/2016 03/13/2016  Total Protein 6.1 - 8.1 g/dL 6.6 6.0(L) 6.2  Albumin 3.6 - 5.1 g/dL - 3.8 3.8  AST 10 - 35 U/L _2 ALT 6 - 29 U/L _3 Alk Phosphatase 33 - 130 U/L - 79 76  Total Bilirubin 0.2 - 1.2 mg/dL 0.6 0.5 0.4   CBC Latest Ref Rng & Units 04/13/2017 03/03/2017 10/22/2016  WBC 3.8 - 10.8 Thousand/uL 5.7 6.9 6.3  Hemoglobin 11.7 - 15.5 g/dL 15.0 14.6 14.6  Hematocrit 35.0 - 45.0 % 43.8 46.2(H) 44.3  Platelets 140 - 400 Thousand/uL 125(L) 130(L) 134(L)   Lab Results  Component Value Date   MCV 89.9 04/13/2017   MCV 96.3 03/03/2017   MCV 93.7 10/22/2016   Lab Results  Component Value Date   TSH 3.33 04/13/2017   Lipid Panel     Component Value Date/Time   CHOL 124 04/13/2017 1439   CHOL 118 12/25/2015 0909   TRIG 214 (H) 04/13/2017 1439   HDL 46 (L) 04/13/2017 1439  HDL 45 12/25/2015 0909   CHOLHDL 2.7 04/13/2017 1439   VLDL 40 (H) 01/16/2015 1028   LDLCALC 44 12/25/2015 0909     RADIOLOGY: No results found.  IMPRESSION:  1. PAF (paroxysmal atrial fibrillation) ([place])   2. S/P TAVR (transcatheter aortic valve replacement)   3. CAD in native artery   4. Warfarin anticoagulation   5. Vitamin D deficiency   6. Essential hypertension      ASSESSMENT AND PLAN: Ms. Molenda is a 77 year old female who has documented CAD, and PVD who developed progressive aortic valve stenosis and underwent successful TAVR procedure on 08/31/2014 with placement of a 26 mm Edward Sapien 3 Valve from the right femoral approach. Recently developed stage II hypertension resulting in increased medical regimen.  She had developed atrial fibrillation for which he was unaware.  She has converted back to sinus rhythm on her increased beta blocker dose.  She is on warfarin for anticoagulation and denies bleeding.  Her last echo Doppler study in July 2018 continue to show normal systolic function.  Her bioprosthetic TAVR valve was functioning well without mobility  restriction.  The mean gradient was 10 with a peak gradient of 21.  Her ECG today reveals that she is in atrial fibrillation of questionable duration.  She had seen her primary physician earlier in the week and there was no mention in that report of atrial fibrillation.  No ECG was done.  I am recommending further titration of metoprolol, succinate up to 100 mg daily.  I'm scheduling her for repeat echo Doppler study.  She is significantly vitamin D deficient and I am initiating vitamin D 50,000 units weekly for the next 3 months.  Prior nor is therapeutic at 2.6.  I will see her in 4 weeks for reevaluation.  Time spent: 25 minutes  Troy Sine, MD, [facility]  04/16/2017 4:17 PM

## 2017-04-14 NOTE — Patient Instructions (Signed)
Medication Instructions:  INCREASE metoprolol succinate (Toprol XL) to 100 mg daily  START Vitamin D 50,000 units once weekly x 3 months  Testing/Procedures: Your physician has requested that you have an echocardiogram. Echocardiography is a painless test that uses sound waves to create images of your heart. It provides your doctor with information about the size and shape of your heart and how well your heart's chambers and valves are working. This procedure takes approximately one hour. There are no restrictions for this procedure.  This will be done at our [facility] location:  [place]: 4 weeks with Dr. Claiborne Billings   If you need a refill on your cardiac medications before your next appointment, please call your pharmacy.

## 2017-04-16 ENCOUNTER — Encounter: Payer: Self-pay | Admitting: Cardiovascular Disease

## 2017-04-16 ENCOUNTER — Encounter: Payer: Self-pay | Admitting: Family Medicine

## 2017-04-20 ENCOUNTER — Encounter: Payer: Self-pay | Admitting: Internal Medicine

## 2017-04-23 ENCOUNTER — Other Ambulatory Visit: Payer: Self-pay

## 2017-04-23 ENCOUNTER — Ambulatory Visit (HOSPITAL_COMMUNITY): Payer: Medicare Other | Attending: Cardiovascular Disease

## 2017-04-23 DIAGNOSIS — Z952 Presence of prosthetic heart valve: Secondary | ICD-10-CM | POA: Diagnosis not present

## 2017-04-23 DIAGNOSIS — I2510 Atherosclerotic heart disease of native coronary artery without angina pectoris: Secondary | ICD-10-CM | POA: Insufficient documentation

## 2017-04-23 DIAGNOSIS — I071 Rheumatic tricuspid insufficiency: Secondary | ICD-10-CM | POA: Insufficient documentation

## 2017-04-23 DIAGNOSIS — Z8673 Personal history of transient ischemic attack (TIA), and cerebral infarction without residual deficits: Secondary | ICD-10-CM | POA: Insufficient documentation

## 2017-04-23 DIAGNOSIS — R011 Cardiac murmur, unspecified: Secondary | ICD-10-CM | POA: Diagnosis not present

## 2017-04-23 DIAGNOSIS — I480 Paroxysmal atrial fibrillation: Secondary | ICD-10-CM | POA: Insufficient documentation

## 2017-04-23 DIAGNOSIS — Z953 Presence of xenogenic heart valve: Secondary | ICD-10-CM | POA: Diagnosis not present

## 2017-04-23 DIAGNOSIS — I4891 Unspecified atrial fibrillation: Secondary | ICD-10-CM | POA: Diagnosis present

## 2017-05-01 ENCOUNTER — Other Ambulatory Visit: Payer: Self-pay | Admitting: Allergy & Immunology

## 2017-05-01 ENCOUNTER — Other Ambulatory Visit: Payer: Self-pay | Admitting: Family Medicine

## 2017-05-07 ENCOUNTER — Telehealth: Payer: Self-pay | Admitting: Family Medicine

## 2017-05-07 DIAGNOSIS — Z7901 Long term (current) use of anticoagulants: Secondary | ICD-10-CM | POA: Diagnosis not present

## 2017-05-07 LAB — PROTIME-INR
INR: 2.1 — ABNORMAL HIGH
Prothrombin Time: 22 s — ABNORMAL HIGH (ref 9.0–11.5)

## 2017-05-07 NOTE — Telephone Encounter (Signed)
Left message that INR order is already at lab.

## 2017-05-07 NOTE — Telephone Encounter (Signed)
Patient is requesting an INR order for lab

## 2017-05-10 ENCOUNTER — Telehealth: Payer: Self-pay

## 2017-05-10 NOTE — Telephone Encounter (Signed)
Called and LVM advising patient of results. Advised to call back if any questions.

## 2017-05-10 NOTE — Telephone Encounter (Signed)
-----   Message from Raylene Everts, MD sent at 05/10/2017  8:26 AM EDT ----- Please call and tell Katelyn [name] her INR.  It is therapeutic.  Check monthly

## 2017-05-11 ENCOUNTER — Encounter: Payer: Self-pay | Admitting: Allergy & Immunology

## 2017-05-11 ENCOUNTER — Ambulatory Visit (INDEPENDENT_AMBULATORY_CARE_PROVIDER_SITE_OTHER): Payer: Medicare Other | Admitting: Allergy & Immunology

## 2017-05-11 VITALS — BP 130/68 | HR 62 | Temp 98.0°F | Resp 18

## 2017-05-11 DIAGNOSIS — J300 Vasomotor rhinitis: Secondary | ICD-10-CM | POA: Diagnosis not present

## 2017-05-11 DIAGNOSIS — I2510 Atherosclerotic heart disease of native coronary artery without angina pectoris: Secondary | ICD-10-CM

## 2017-05-11 MED ORDER — IPRATROPIUM BROMIDE 0.06 % NA SOLN: 1.0000 | Freq: Four times a day (QID) | NASAL | 5 refills | Status: DC | PRN

## 2017-05-11 NOTE — Progress Notes (Signed)
FOLLOW UP  Date of Service/Encounter:  05/11/17   Assessment:   Vasomotor rhinitis   Plan/Recommendations:   1. 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  - Continue with Benadryl as needed. - Samples of Ryvent provided (can use every 6 hours as needed for runny nose) in lieu of Benadryl.  - I did ask her to call us if she would like a prescription for the Ryvent.   2. Return in about 1 year (around 05/12/2018).  Subjective:   Katelyn [name] is a 77 y.o. female presenting today for follow up of  Chief Complaint  Patient presents with  . Non-Allergic Rhinitis    everything has been doing well. she needs medication refills.     Katelyn [name] has a history of the following: Patient Active Problem List   Diagnosis Date Noted  . Gastroesophageal reflux disease 04/13/2017  . Esophageal dysphagia 04/13/2017  . Chronic anticoagulation 03/10/2017  . Current long-term use of postmenopausal hormone replacement therapy 03/10/2017  . Non-allergic rhinitis 11/10/2016  . Paroxysmal atrial fibrillation ([place]) 02/13/2016  . 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 Tachycardia-bradycardia syndrome ([place])   . Atrial fibrillation with RVR ([place]) 08/29/2014  . LBBB (left bundle branch block), chronic 08/29/2014  . CAD in native artery   . Peripheral vascular disease ([place]) 12/13/2012  . Essential hypertension 12/13/2012  . Hyperlipidemia with target LDL less than 70 12/13/2012  . Atherosclerosis of native arteries of extremity with intermittent claudication ([place]) 08/10/2011    History obtained from: chart review and patient.  Katelyn [name] Primary Care Provider is Raylene Everts, MD.     Katelyn [name] is a 77 y.o. female presenting for a follow up visit. She was last seen in September 2018. At that time, we continued her on Dymista one spray per nostril 1-2  times daily as well as Allegra. We also added on nasal ipratropium one spray per nostril every six hours as needed. She had testing that was negative in February 2018.   Since the last visit, she has done well. She remains on the nasal ipratropium which does provide some relief, but it does help somewhat. She is no longer on the Dymista and does not know why she stopped this. She is no longer on the Allegra because it did not seem to be helping. She does use Benadryl at night to help with drying of of her nasal passages.   Otherwise, there have been no changes to her past medical history, surgical history, family history, or social history. She is doing well from a health perspective otherwise. She is doing well following her cataract surgery and she is doing well following her heart valve replacement. She has no plans to travel and she is looking forward to the warmer weather.     Review of Systems: a 14-point review of systems is pertinent for what is mentioned in HPI.  Otherwise, all other systems were negative. Constitutional: negative other than that listed in the HPI Eyes: negative other than that listed in the HPI Ears, nose, mouth, throat, and face: negative other than that listed in the HPI Respiratory: negative other than that listed in the HPI Cardiovascular: negative other than that listed in the HPI Gastrointestinal: negative other than that listed in the HPI Genitourinary: negative other than that listed in the HPI Integument: negative other than that listed in  the HPI Hematologic: negative other than that listed in the HPI Musculoskeletal: negative other than that listed in the HPI Neurological: negative other than that listed in the HPI Allergy/Immunologic: negative other than that listed in the HPI    Objective:   Blood pressure 130/68, pulse 62, temperature 98 F (36.7 C), temperature source Oral, resp. rate 18, SpO2 95 %. There is no height or weight on file to  calculate BMI.   Physical Exam:  General: Alert, interactive, in no acute distress. Eyes: No conjunctival injection bilaterally, no discharge on the right, no discharge on the left and no Horner-Trantas dots present. PERRL bilaterally. EOMI without pain. No photophobia.  Ears: Cerumen bilaterall but TMs clearly visualized, Right TM pearly gray with normal light reflex, Left TM pearly gray with normal light reflex, Right TM intact without perforation and Left TM intact without perforation.  Nose/Throat: External nose within normal limits and septum midline. Turbinates minimally edematous without discharge. Posterior oropharynx mildly erythematous without cobblestoning in the posterior oropharynx. Tonsils 2+ without exudates.  Tongue without thrush. Lungs: Clear to auscultation without wheezing, rhonchi or rales. No increased work of breathing. CV: Normal S1/S2. No murmurs. Capillary refill <2 seconds.  Skin: Warm and dry, without lesions or rashes. Neuro:   Grossly intact. No focal deficits appreciated. Responsive to questions.  Diagnostic studies: none      Salvatore Marvel, MD [facility]

## 2017-05-11 NOTE — Patient Instructions (Addendum)
1. Non-allergic rhinitis (negative testing in Feb 2018) - Continue with nasal ipratropium one spray per nostril every 6 hours as needed for runny nose.  - Continue with Benadryl as needed. - Samples of Ryvent provided (can use every 6 hours as needed for runny nose) in lieu of Benadryl.   2. Return in about 1 year (around 05/12/2018).   Please inform us of any Emergency Department visits, hospitalizations, or changes in symptoms. Call us before going to the ED for breathing or allergy symptoms since we might be able to fit you in for a sick visit. Feel free to contact us anytime with any questions, problems, or concerns.  It was a pleasure to see you again today!  Websites that have reliable patient information: 1. American Academy of Asthma, Allergy, and Immunology: www.aaaai.org 2. Food Allergy Research and Education (FARE): foodallergy.org 3. Mothers of Asthmatics: http://www.asthmacommunitynetwork.org 4. American College of Allergy, Asthma, and Immunology: www.acaai.org

## 2017-05-21 ENCOUNTER — Encounter: Payer: Self-pay | Admitting: Cardiovascular Disease

## 2017-05-21 ENCOUNTER — Ambulatory Visit (INDEPENDENT_AMBULATORY_CARE_PROVIDER_SITE_OTHER): Payer: Medicare Other | Admitting: Cardiovascular Disease

## 2017-05-21 VITALS — BP 128/66 | HR 56 | Ht 69.5 in | Wt 195.4 lb

## 2017-05-21 DIAGNOSIS — Z952 Presence of prosthetic heart valve: Secondary | ICD-10-CM

## 2017-05-21 DIAGNOSIS — I480 Paroxysmal atrial fibrillation: Secondary | ICD-10-CM | POA: Diagnosis not present

## 2017-05-21 DIAGNOSIS — I10 Essential (primary) hypertension: Secondary | ICD-10-CM | POA: Diagnosis not present

## 2017-05-21 DIAGNOSIS — Z7901 Long term (current) use of anticoagulants: Secondary | ICD-10-CM | POA: Diagnosis not present

## 2017-05-21 DIAGNOSIS — I2510 Atherosclerotic heart disease of native coronary artery without angina pectoris: Secondary | ICD-10-CM | POA: Diagnosis not present

## 2017-05-21 NOTE — Progress Notes (Signed)
Patient ID: Katelyn [name], female   DOB: [birth year], 77 y.o.   MRN: [id number]    PCP: Dr. Newt Minion  HPI: Katelyn [name] is a 77 y.o. female who presents to the office for a 6 week follow-up cardiology evaluation  Mrs. Hitch has known CAD and underwent initial cardiac catheterization in  April 2012 which revealed mid RCA occlusion with collaterals, 50% LAD stenosis, 30% circumflex stenosis, and 40% marginal stenosis. 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 Trula Slade. She has a history of hypertension with grade 2 diastolic dysfunction, hyperlipidemia, and has developed moderately severe aortic valve stenosis. An echo Doppler study in May 2013 showed a mean transvalvular aortic gradient of 34 with a maximum of 56. In December 2013, her mean gradient was 32 and peak 56 with a valve area of 0.84. An echo Doppler study on 12/06/2012 continued to show normal systolic function with an ejection fraction of 60-65%. She had grade 1 diastolic dysfunction. Peak gradients were  61 mm and mean gradient was 38 mm.  The aortic valve area 0.8-0.9 cm. She had mild to moderate aortic insufficiency. Had mildly thickened mitral valve leaflets with mild MR. The left atrium was mildly dilated. She had mild TR. Pulmonary pressure was mildly increased at 33 mm.  A follow-up echo Doppler study on 02/12/2014  Showed normal EF at 55-60%. Her aortic valve was severely calcified and stenosis was felt to be severe with a mean gradient of 42, a peak gradient of 64 which had slightly increaed from 38 and 61 mm, respectively.  She began to develop exertional chest discomfort with associated dyspnea and fatigue.  She underwent repeat cardiac catheterization on 07/25/2014 which showed a chronically occluded RCA with left-to-right collaterals, a 70% diagonal stenosis, 50% ostial circumflex  stenosis and 40% AV groove circumflex stenosis.  Her aortic stenosis at further progressed such that she had a mean gradient of 42 mm.  She was felt to be a poor candidate for conventional aortic valve replacement and on 08/31/2014 underwent successful  TAVR with placement of a 26 mm Edward Sapien 3 valve from the right femoral approach by Drs. Roxy Manns and [place].  She developed postoperative atrial fibrillation for which she was transiently treated with amiodarone and when seen for office follow-up she was back in sinus rhythm.  She has noticed improvement in energy.  She is unaware of recurrent atrial fibrillation.  She denies recurrent chest tightness.  She at times is fatigued.  A subsequent echo on 10/01/2014 showed an EF of 60-65% with a mean aortic gradient of 16 mm and peak gradient 26 with trivial aortic insufficiency.    A follow-up echo Doppler study on 09/09/2015 showed an EF of 60-65% and grade 2 diastolic dysfunction.  There were no wall motion abnormalities.  Her 26 mm Edward's Sapien 3 valve was well-seated from her TAVR procedure.  The mean valve gradient was 16 mm.  There was no evidence for perivalvular leak.  There was mitral annular calcification with mild MR, mild LA dilatation and mild dilation of her right ventricle.  There is mild coronary hypertension with a PA pressure 33 mm.    When I  saw her in October 2017, her dose of valsartan had been reduced prior to the office visit by Dr. Karie Kirks.  At that time, she was having more frequent palpitations and had trigeminy on her ECG.  With her being on reduce valsartan.  I recommended further titration of her beta blocker and increased her Toprol to 50 mg daily.    When I saw her in December 2017 her blood pressure was significantly elevated.  Her resting pulse was 57.  She was no longer taking valsartan and I added amlodipine 5 mg for more optimal blood pressure control.  She  underwent lower extremity Doppler studies on 03/16/2016 by  [place] which revealed normal ABIs bilaterally. When I saw her in f/u on 03/31/16 BP was improved but was in atrial fibrillation which was new.  She has continued to be on warfarin anticoagulation and her INR was therapeutic.  I further titrated Toprol-XL to 75 mg for 5 days and recommended that she increase it to 100 mg depending upon her heart rate response.  She did take 100 mg, she did experience some dizziness and as result, self reduced her back to 75 mg daily.  She is unaware of her rhythm being irregular.  In March 2018 her blood pressure was stable.  On 09/09/2016, she underwent a one-year follow-up echo Doppler study.  This showed mild LVH with normal systolic function.  There was grade 1 diastolic dysfunction.  The TAVR bioprosthesis was functioning well.  She had mild LA dilation and mild pulmonary hypertension at 40 mm.  She recently underwent lab work in May by Dr. Karie Kirks.  After she left the office.  I was able to obtain these results.  This showed a cholesterol of 128 and LDL of 54, HDL 49, and triglycerides of 168.  Her creatinine was 1.0.  Hemoglobin 15.7, hematocrit 45.8.  She had recently been notified by her pharmacy that the valsartan that she was taken was recently recalled.  When I last saw her, she had stopped taking the valsartan.  I changed her to irbesartan 150 mg.  She was seen by Dr. Meda Coffee, for primary care in [place] in early February 2019. Her INR was therapeutic at 2.6.  She was on amlodipine 5 mg, furosemide as needed, irbesartan 150 mg, and Toprol 25 mg in addition to rosuvastatin 20 mg and warfarin.  Lab from 04/24/17  showed a creatinine of 1.01.  TSH was pending.  Hemoglobin 15, hematocrit 43, but platelets were decreased at 1 25,000.  Total cholesterol is 124, HDL 46, triglycerides 214, and LDL 50.  Vitamin D was very low at 7.  When I saw her in February 2019, her ECG revealed that she was in atrial fibrillation for which she was asymptomatic.  At that time, I  titrated metoprolol succinate to 100 mg daily.  I also started her on 50,000 units of vitamin D weekly for at least 3 months.  She underwent a follow-up echo Doppler study on for 05/21/2017 which showed an EF of 60-65% with grade 2 diastolic dysfunction.  HerTAVR bioprosthesis was well-seated and functioned well.  There was no aortic insufficiency. Her left atrium was severely dilated.  Estimated PA pressure was 34 mm.  Over the past several weeks, she has felt well.  She is unaware of any rhythm disturbance.  She denies shortness of breath, PND, orthopnea.  She presents for reevaluation.  Past Medical History:  Diagnosis Date  . Allergy    rhinitis  . Aortic stenosis, severe   . Aortic stenosis, severe   . Arthritis    spine and various joints  . Cataract   . Childhood asthma   . Chronic bronchitis ([place])       .  Claudication ([place])   . Coronary artery disease    a. April 2012 which revealed mid RCA occlusion with collaterals, 50% LAD stenosis, 30% circumflex stenosis, and 40% marginal stenosis  . GERD (gastroesophageal reflux disease)   . Heart murmur   . Hyperlipidemia   . Hypertension   . LUMBAR RADICULOPATHY, RIGHT 12/21/2007   Qualifier: Diagnosis of  By: Aline Brochure MD, Dorothyann Peng    . Peripheral vascular disease ([place])   . 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 Seroma, postoperative    Left Groin  . Severe aortic valve stenosis 08/31/2014  . Stroke [facility]) 1982 X 2   "a little bit weaker on the left side since" (08/29/2014)    Past Surgical History:  Procedure Laterality Date  . AORTO-FEMORAL BYPASS GRAFT Bilateral 06/27/10  . BASAL CELL CARCINOMA EXCISION Left    face  . CARDIAC CATHETERIZATION N/A 07/25/2014   Procedure: Right/Left Heart Cath and Coronary Angiography;  Surgeon: Troy Sine, MD;  Location: [facility];  Service: Cardiovascular;  Laterality:  N/A;  . CATARACT EXTRACTION, BILATERAL Bilateral 09/29/2016  . COLONOSCOPY    . DILATION AND CURETTAGE OF UTERUS  "2 or 3"  . MULTIPLE TOOTH EXTRACTIONS    . PR VEIN BYPASS GRAFT,AORTO-FEM-POP  06/12/10  . TEE WITHOUT CARDIOVERSION N/A 08/31/2014   Procedure: TRANSESOPHAGEAL ECHOCARDIOGRAM (TEE);  Surgeon: Rexene Alberts, MD;  Location: [facility];  Service: Open Heart Surgery;  Laterality: N/A;  . TRANSCATHETER AORTIC VALVE REPLACEMENT, TRANSFEMORAL N/A 08/31/2014   Procedure: TRANSCATHETER AORTIC VALVE REPLACEMENT, TRANSFEMORAL approach;  Surgeon: Rexene Alberts, MD;  Location: [facility];  Service: Open Heart Surgery;  Laterality: N/A;  . TUBAL LIGATION  1970's  . VAGINAL HYSTERECTOMY  1970's   Partial     Allergies  Allergen Reactions  . Adhesive [Tape] Rash  . Latex Rash  . Tetanus Toxoids Rash    Current Outpatient Medications  Medication Sig Dispense Refill  . amLODipine (NORVASC) 5 MG tablet take 1 tablet by mouth once daily 90 tablet 3  . estradiol (ESTRACE) 2 MG tablet     . furosemide (LASIX) 20 MG tablet Take 20 mg by mouth as needed (fluid retention).     Marland Kitchen ipratropium (ATROVENT) 0.06 % nasal spray Place 1 spray into both nostrils every 6 (six) hours as needed for rhinitis. 16 mL 5  . irbesartan (AVAPRO) 150 MG tablet Take 1 tablet (150 mg total) by mouth daily. 90 tablet 3  . metoprolol succinate (TOPROL-XL) 100 MG 24 hr tablet Take 1 tablet (100 mg total) by mouth daily. 90 tablet 3  . nitroGLYCERIN (NITROSTAT) 0.4 MG SL tablet Place 1 tablet (0.4 mg total) under the tongue every 5 (five) minutes as needed for chest pain. 25 tablet 6  . potassium chloride (KLOR-CON) 10 MEQ CR tablet Take 10 mEq by mouth as needed (when taking Lasix).     . rosuvastatin (CRESTOR) 20 MG tablet Take 1 tablet (20 mg total) by mouth daily. 90 tablet 0  . Vitamin D, Ergocalciferol, (DRISDOL) 50000 units CAPS capsule Take 1 capsule (50,000 Units total) by mouth every 7 (seven) days. 14 capsule 0  .  warfarin (COUMADIN) 3 MG tablet Take 3 mg by mouth daily at 6 PM.      No current facility-administered medications for this visit.     Social History   Socioeconomic History  . Marital  status: Married    Spouse name: bobby  . Number of children: 1  . Years of education: Not on file  . Highest education level: High school graduate  Occupational History  . 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 Financial resource strain: Not very hard  . Food insecurity:    Worry: Never true    Inability: Never true  . Transportation needs:    Medical: No    Non-medical: No  Tobacco Use  . Smoking status: Former Smoker    Packs/day: 2.00    Years: 56.00    Pack years: 112.00    Types: Cigarettes    Start date: 03/02/1952    Last attempt to quit: 06/01/2010    Years since quitting: 6.9  . Smokeless tobacco: Never Used  . Tobacco comment: HAD WEANED DOWN TO 1/4 PK A DAY BEFORE D/C  Substance and Sexual Activity  . Alcohol use: No    Alcohol/week: 0.0 oz    Frequency: Never    Comment: 08/29/2014 "never drank much; quit in the 1990's"  . Drug use: No  . Sexual activity: Yes  Lifestyle  . Physical activity:    Days per week: 0 days    Minutes per session: 0 min  . Stress: Only a little  Relationships  . Social connections:    Talks on phone: More than three times a week    Gets together: More than three times a week    Attends religious service: Patient refused    Active member of club or organization: Patient refused    Attends meetings of clubs or organizations: Never    Relationship status: Married  . Intimate partner violence:    Fear of current or ex partner: No    Emotionally abused: No    Physically abused: No    Forced sexual activity: No  Other Topics Concern  . Not on file  Social History Narrative   Married to [place] 32 years   Mortimer Fries has 3 daughters   Patient has one daughter, 3 grands   Likes to crochet    Family History  Problem Relation  Age of Onset  . Heart disease Mother 73       died young  . Varicose Veins Mother   . Stroke Father   . Hypertension Father   . Heart disease Father 66       Aneurysm  . Heart attack Father   . Arthritis Brother   . Stroke Brother   . Diabetes Daughter   . Arthritis Daughter   . Allergic rhinitis Neg Hx   . Angioedema Neg Hx   . Asthma Neg Hx   . Atopy Neg Hx   . Eczema Neg Hx   . Immunodeficiency Neg Hx   . Urticaria Neg Hx    Social history is notable that she is married has one child. She quit tobacco in April 2012 after a long-standing tobacco history having started smoking at age 73.   ROS General: Negative; No fevers, chills, or night sweats;  HEENT: Negative; No changes in vision or hearing, sinus congestion, difficulty swallowing Pulmonary: Negative; No cough, wheezing, shortness of breath, hemoptysis Cardiovascular: See history of present illness.   GI: Negative; No nausea, vomiting, diarrhea, or abdominal pain GU: Negative; No dysuria, hematuria, or difficulty voiding Musculoskeletal: Negative; no myalgias, joint pain, or weakness Hematologic/Oncology: Negative; no easy bruising, bleeding Endocrine: Negative; no heat/cold intolerance; no diabetes Neuro: Negative; no changes  in balance, headaches Skin: Negative; No rashes or skin lesions Psychiatric: Negative; No behavioral problems, depression Sleep: Negative; No snoring, daytime sleepiness, hypersomnolence, bruxism, restless legs, hypnogognic hallucinations, no cataplexy Other comprehensive 14 point system review is negative.   PE BP 128/66   Pulse (!) 56   Ht 5' 9.5" (1.765 m)   Wt 195 lb 6.4 oz (88.6 kg)   BMI 28.44 kg/m    Repeat blood pressure was 124/64  Wt Readings from Last 3 Encounters:  05/21/17 195 lb 6.4 oz (88.6 kg)  04/14/17 193 lb 6.4 oz (87.7 kg)  04/13/17 194 lb 1.9 oz (88.1 kg)   General: Alert, oriented, no distress.  Skin: normal turgor, no rashes, warm and dry HEENT:  Normocephalic, atraumatic. Pupils equal round and reactive to light; sclera anicteric; extraocular muscles intact;  Nose without nasal septal hypertrophy Mouth/Parynx benign; Mallinpatti scale 3 Neck: No JVD, no carotid bruits; normal carotid upstroke Lungs: clear to ausculatation and percussion; no wheezing or rales Chest wall: without tenderness to palpitation Heart: PMI not displaced, RRR, s1 s2 normal, 2/6 systolic murmur, no diastolic murmur, no rubs, gallops, thrills, or heaves Abdomen: soft, nontender; no hepatosplenomehaly, BS+; abdominal aorta nontender and not dilated by palpation. Back: no CVA tenderness Pulses 2+ Musculoskeletal: full range of motion, normal strength, no joint deformities Extremities: no clubbing cyanosis or edema, Homan's sign negative  Neurologic: grossly nonfocal; Cranial nerves grossly wnl Psychologic: Normal mood and affect   ECG (independently read by me): sinus bradycardia 56 bpm.  Left bundle branch block.  PR interval 198 ms.  04/14/2016 ECG (independently read by me): Atrial fibrillation at 77 bpm left bundle branch block.  Left axis deviation.  QTc interval 520 ms  August 2018 ECG (independently read by me): Sinus bradycardia with occasional PACs.  Left axis deviation.  Borderline first-degree AV block with a PR interval of 22 ms.  Left bundle-branch block  March 2018 ECG (independently read by me): Sinus bradycardia 52 bpm.  First-degree AV block with a PR interval of 210 ms.  Left bundle branch block with repolarization changes.  PE the C.  January 2018 ECG (independently read by me): Atrial fibrillation with a rate at 91.  Left bundle-branch block.  December 2017 ECG (independently read by me): Sinus bradycardia 57 bpm.  Left bundle branch block with repolarization changes.  October 2017 ECG (independently read by me): Sinus rhythm at 74 bpm with frequent PVCs in a trigeminal pattern.  March 2017 ECG (independently read by me): Sinus  bradycardia 54 bpm.  Left bundle branch block with repolarization changes.  November 2016 ECG (independently read by me): Sinus bradycardia 55 bpm with occasional premature atrial complex.  Left bundle-branch block.  February 2016 ECG (independently read by me):  Sinus bradycardia 57 bpm.  Left bundle branch block with repolarization changes..  No ectopy.  December 2015 ECG (independently read by me): Sinus bradycardia 58 bpm.  Mild left atrial enlargement.  Left axis deviation.  Left bundle branch block with repolarization changes.  October 2014 ECG: Sinus bradycardia with left axis deviation. Left bundle branch block with repolarization changes  LAB BMP Latest Ref Rng & Units 04/13/2017 10/22/2016 04/20/2016  Glucose 65 - 139 mg/dL 90 117(H) 99  BUN 7 - 25 mg/dL '20 20 21  ' Creatinine 0.60 - 0.93 mg/dL 1.01(H) 1.18(H) 1.14(H)  BUN/Creat Ratio 6 - 22 (calc) 20 - -  Sodium 135 - 146 mmol/L 138 140 141  Potassium 3.5 - 5.3 mmol/L 4.8 4.8 4.6  Chloride 98 - 110 mmol/L 105 105 106  CO2 20 - 32 mmol/L '29 25 28  ' Calcium 8.6 - 10.4 mg/dL 8.8 9.0 8.9   Hepatic Function Latest Ref Rng & Units 04/13/2017 10/22/2016 03/13/2016  Total Protein 6.1 - 8.1 g/dL 6.6 6.0(L) 6.2  Albumin 3.6 - 5.1 g/dL - 3.8 3.8  AST 10 - 35 U/L '16 21 19  ' ALT 6 - 29 U/L '12 16 13  ' Alk Phosphatase 33 - 130 U/L - 79 76  Total Bilirubin 0.2 - 1.2 mg/dL 0.6 0.5 0.4   CBC Latest Ref Rng & Units 04/13/2017 03/03/2017 10/22/2016  WBC 3.8 - 10.8 Thousand/uL 5.7 6.9 6.3  Hemoglobin 11.7 - 15.5 g/dL 15.0 14.6 14.6  Hematocrit 35.0 - 45.0 % 43.8 46.2(H) 44.3  Platelets 140 - 400 Thousand/uL 125(L) 130(L) 134(L)   Lab Results  Component Value Date   MCV 89.9 04/13/2017   MCV 96.3 03/03/2017   MCV 93.7 10/22/2016   Lab Results  Component Value Date   TSH 3.33 04/13/2017   Lipid Panel     Component Value Date/Time   CHOL 124 04/13/2017 1439   CHOL 118 12/25/2015 0909   TRIG 214 (H) 04/13/2017 1439   HDL 46 (L) 04/13/2017 1439    HDL 45 12/25/2015 0909   CHOLHDL 2.7 04/13/2017 1439   VLDL 40 (H) 01/16/2015 1028   LDLCALC 50 04/13/2017 1439     RADIOLOGY: No results found.  IMPRESSION:  1. Paroxysmal atrial fibrillation (HCC)   2. CAD in native artery   3. Warfarin anticoagulation   4. Essential hypertension   5. S/P TAVR (transcatheter aortic valve replacement)      ASSESSMENT AND PLAN: Ms. Hagerty is a 77 year old female who has documented CAD and PVD who developed progressive aortic valve stenosis and underwent successful TAVR procedure on 08/31/2014 with placement of a 26 mm Edward Sapien 3 Valve from the right femoral approach.  She later  developed stage II hypertension resulting in increased medical regimen.  .  She developed postoperative atrial fibrillation and previouslyconverted back to sinus rhythm on her increased beta blocker dose.  She is on warfarin for anticoagulation and denies bleeding.  An echo Doppler study in July 2018 continued to show normal systolic function.  Her bioprosthetic TAVR valve was functioning well without mobility restriction.  The mean gradient was 10 with a peak gradient of 21.  When I saw her on 04/14/2017, her ECG demonstrated recurrent atrial fibrillation of questionable duration.  She was anticoagulated on warfarin.  I further titrated metoprolol succinate up to 100 mg daily.  Her repeat echo Doppler study was reviewed with her in detail today.  This continued to show normal systolic function.  There was grade 2 diastolic dysfunction.  Her bioprosthetic valve was functioning well.  The mean gradient was 12 and peak gradient 25.  There was no aortic insufficiency.  PA peak pressure was 34 mm.  Over the past several weeks she has noticed improved energy and feels better.  Her ECG today confirms that she is converted back to sinus rhythm.  She is not having any ectopy.  She continues to be on rosuvastatin 20 mg for hyperlipidemia.  LDL is excellent at 50.  Her blood pressure today  is excellent on irbesartan 150 mg, amlodipine 5 mg and she takes furosemide on an as-needed basis.  She also continues to be on the Toprol-XL 100 mg daily.  She is tolerating vitamin D replacement.  She will be following  up with her primary M.D.  I will see her in 6 months for reevaluation or sooner if problems arise.   Time spent: 25 minutes  Troy Sine, MD, [facility]  05/21/2017 12:48 PM

## 2017-05-21 NOTE — Patient Instructions (Signed)
MEDICATION INSTRUCTIONS  NO CHANGE WITH CURRENT MEDICATIONS       Your physician wants you to follow-up in [place]. You will receive a reminder letter in the mail two months in advance. If you don't receive a letter, please call our office to schedule the follow-up appointment.  If you need a refill on your cardiac medications before your next appointment, please call your pharmacy.

## 2017-06-07 ENCOUNTER — Other Ambulatory Visit: Payer: Self-pay | Admitting: Family Medicine

## 2017-06-07 DIAGNOSIS — Z952 Presence of prosthetic heart valve: Secondary | ICD-10-CM | POA: Diagnosis not present

## 2017-06-08 LAB — PROTIME-INR
INR: 2.8 — ABNORMAL HIGH
Prothrombin Time: 29.8 s — ABNORMAL HIGH (ref 9.0–11.5)

## 2017-06-08 NOTE — Progress Notes (Signed)
Spoke with patient and gave results of recent lab results with verbal understanding.

## 2017-06-09 ENCOUNTER — Encounter: Payer: Self-pay | Admitting: Gastroenterology

## 2017-06-09 ENCOUNTER — Ambulatory Visit (INDEPENDENT_AMBULATORY_CARE_PROVIDER_SITE_OTHER): Payer: Medicare Other | Admitting: Gastroenterology

## 2017-06-09 ENCOUNTER — Telehealth: Payer: Self-pay | Admitting: Gastroenterology

## 2017-06-09 VITALS — BP 149/85 | HR 81 | Temp 97.1°F | Ht 70.0 in | Wt 191.8 lb

## 2017-06-09 DIAGNOSIS — K219 Gastro-esophageal reflux disease without esophagitis: Secondary | ICD-10-CM

## 2017-06-09 DIAGNOSIS — R1310 Dysphagia, unspecified: Secondary | ICD-10-CM | POA: Diagnosis not present

## 2017-06-09 DIAGNOSIS — I2510 Atherosclerotic heart disease of native coronary artery without angina pectoris: Secondary | ICD-10-CM | POA: Diagnosis not present

## 2017-06-09 DIAGNOSIS — R1319 Other dysphagia: Secondary | ICD-10-CM

## 2017-06-09 MED ORDER — PANTOPRAZOLE SODIUM 40 MG PO TBEC: 40.0000 mg | DELAYED_RELEASE_TABLET | Freq: Every day | ORAL | 5 refills | Status: DC

## 2017-06-09 NOTE — Assessment & Plan Note (Signed)
77 year old female with chronic nocturnal reflux occurring for at least 7 years, now with complaints of solid food dysphagia off and on.  Sometimes food gets lodged and she has to wait for her to go down but usually will within a few minutes.  Suspect esophageal stricture related to untreated reflux.  We discussed at length options of barium esophagram versus upper endoscopy.  Things are complicated by a requirement of Coumadin therapy.  Based on her symptoms however I suspect esophageal stricture and ultimately anticipate esophagram to be abnormal and she would still need upper endoscopy.  We will start her on PPI therapy, reinforced antireflux measures.  We will discuss Coumadin management with Dr. Claiborne Billings as well as Dr. Gala Romney and make arrangements accordingly.  I have discussed the risks, alternatives, benefits with regards to but not limited to the risk of reaction to medication, bleeding, infection, perforation and the patient is agreeable to proceed. Written consent to be obtained.

## 2017-06-09 NOTE — Progress Notes (Signed)
Primary Care Physician:  Caren Macadam, MD  Primary Gastroenterologist:  Garfield Cornea, MD   Chief Complaint  Patient presents with  . Gastroesophageal Reflux  . Dysphagia    food gets stuck in chest; gets strangled    HPI:  Katelyn [name] is a 77 y.o. female with multiple comorbidities including here peripheral vascular disease status post aortofemoral bypass graft, transcatheter aortic valve replacement for severe aortic stenosis (9528) complicated by postoperative A. fib, history of stroke, CAD who presents for further evaluation of GERD, esophageal dysphagia at the request of Dr. Meda Coffee.  Feels like stufff getting stuck in her chest, does not occur every time she eats.  She has been more careful chewing her food thoroughly to try to prevent problems.  No issues swallowing her pills.  When the food becomes lodged she has to wait for her to go down on its own, she often walks around, stretches.  She finds that she is not able to wash it down with liquids.  It is uncomfortable.  During the day she does well as far as reflux.  Really denies any significant heartburn.  At nighttime however she frequently wakes up with acid in her mouth, burning.  She takes Tums when this occurs and will have to wait a while before she lays back down.  She denies any preventative acid reflux medications such as PPI therapy or H2 blockers.  No bowel concerns.  No blood in stool or melena.  Her last colonoscopy was in 2008 by Dr. Laural Golden and was unremarkable.  Patient declines having future colonoscopies.   Current Outpatient Medications  Medication Sig Dispense Refill  . amLODipine (NORVASC) 5 MG tablet take 1 tablet by mouth once daily 90 tablet 3  . furosemide (LASIX) 20 MG tablet Take 20 mg by mouth as needed (fluid retention).     Marland Kitchen ipratropium (ATROVENT) 0.06 % nasal spray Place 1 spray into both nostrils every 6 (six) hours as needed for rhinitis. 16 mL 5  . irbesartan (AVAPRO) 150 MG tablet Take 1 tablet  (150 mg total) by mouth daily. 90 tablet 3  . metoprolol succinate (TOPROL-XL) 100 MG 24 hr tablet Take 1 tablet (100 mg total) by mouth daily. 90 tablet 3  . nitroGLYCERIN (NITROSTAT) 0.4 MG SL tablet Place 1 tablet (0.4 mg total) under the tongue every 5 (five) minutes as needed for chest pain. 25 tablet 6  . potassium chloride (KLOR-CON) 10 MEQ CR tablet Take 10 mEq by mouth as needed (when taking Lasix).     . rosuvastatin (CRESTOR) 20 MG tablet Take 1 tablet (20 mg total) by mouth daily. 90 tablet 0  . Vitamin D, Ergocalciferol, (DRISDOL) 50000 units CAPS capsule Take 1 capsule (50,000 Units total) by mouth every 7 (seven) days. 14 capsule 0  . warfarin (COUMADIN) 3 MG tablet Take 3 mg by mouth daily at 6 PM.     . estradiol (ESTRACE) 2 MG tablet Take 2 mg by mouth daily.      No current facility-administered medications for this visit.     Allergies as of 06/09/2017 - Review Complete 06/09/2017  Allergen Reaction Noted  . Adhesive [tape] Rash 09/19/2010  . Latex Rash 09/19/2010  . Tetanus toxoids Rash 08/29/2014    Past Medical History:  Diagnosis Date  . Allergy    rhinitis  . Aortic stenosis, severe   . Aortic stenosis, severe   . Arthritis    spine and various joints  . Cataract   .  Childhood asthma   . Chronic bronchitis ([place])       . Claudication ([place])   . Coronary artery disease    a. April 2012 which revealed mid RCA occlusion with collaterals, 50% LAD stenosis, 30% circumflex stenosis, and 40% marginal stenosis  . GERD (gastroesophageal reflux disease)   . Heart murmur   . Hyperlipidemia   . Hypertension   . LUMBAR RADICULOPATHY, RIGHT 12/21/2007   Qualifier: Diagnosis of  By: Aline Brochure MD, Dorothyann Peng    . Peripheral vascular disease ([place])   . 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 Seroma, postoperative    Left Groin  . Severe aortic valve  stenosis 08/31/2014  . Stroke [facility]) 1982 X 2   "a little bit weaker on the left side since" (08/29/2014)    Past Surgical History:  Procedure Laterality Date  . AORTO-FEMORAL BYPASS GRAFT Bilateral 06/27/10  . BASAL CELL CARCINOMA EXCISION Left    face  . CARDIAC CATHETERIZATION N/A 07/25/2014   Procedure: Right/Left Heart Cath and Coronary Angiography;  Surgeon: Troy Sine, MD;  Location: [facility];  Service: Cardiovascular;  Laterality: N/A;  . CATARACT EXTRACTION, BILATERAL Bilateral 09/29/2016  . COLONOSCOPY  2008   Dr. Laural Golden: normal.   . DILATION AND CURETTAGE OF UTERUS  "2 or 3"  . MULTIPLE TOOTH EXTRACTIONS    . PR VEIN BYPASS GRAFT,AORTO-FEM-POP  06/12/10  . TEE WITHOUT CARDIOVERSION N/A 08/31/2014   Procedure: TRANSESOPHAGEAL ECHOCARDIOGRAM (TEE);  Surgeon: Rexene Alberts, MD;  Location: [facility];  Service: Open Heart Surgery;  Laterality: N/A;  . TRANSCATHETER AORTIC VALVE REPLACEMENT, TRANSFEMORAL N/A 08/31/2014   Procedure: TRANSCATHETER AORTIC VALVE REPLACEMENT, TRANSFEMORAL approach;  Surgeon: Rexene Alberts, MD;  Location: [facility];  Service: Open Heart Surgery;  Laterality: N/A;  . TUBAL LIGATION  1970's  . VAGINAL HYSTERECTOMY  1970's   Partial     Family History  Problem Relation Age of Onset  . Heart disease Mother 58       died young  . Varicose Veins Mother   . Stroke Father   . Hypertension Father   . Heart disease Father 87       Aneurysm  . Heart attack Father   . Arthritis Brother   . Stroke Brother   . Diabetes Daughter   . Arthritis Daughter   . Allergic rhinitis Neg Hx   . Angioedema Neg Hx   . Asthma Neg Hx   . Atopy Neg Hx   . Eczema Neg Hx   . Immunodeficiency Neg Hx   . Urticaria Neg Hx     Social History   Socioeconomic History  . Marital status: Married    Spouse name: bobby  . Number of children: 1  . Years of education: Not on file  . Highest education level: High school graduate  Occupational History  . 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 Financial resource strain: Not very hard  . Food insecurity:    Worry: Never true    Inability: Never true  . Transportation needs:    Medical: No    Non-medical: No  Tobacco Use  . Smoking status: Former Smoker    Packs/day: 2.00    Years: 56.00    Pack years: 112.00    Types: Cigarettes    Start date: 03/02/1952    Last  attempt to quit: 06/01/2010    Years since quitting: 7.0  . Smokeless tobacco: Never Used  . Tobacco comment: HAD WEANED DOWN TO 1/4 PK A DAY BEFORE D/C  Substance and Sexual Activity  . Alcohol use: No    Alcohol/week: 0.0 oz    Frequency: Never    Comment: 08/29/2014 "never drank much; quit in the 1990's"  . Drug use: No  . Sexual activity: Yes  Lifestyle  . Physical activity:    Days per week: 0 days    Minutes per session: 0 min  . Stress: Only a little  Relationships  . Social connections:    Talks on phone: More than three times a week    Gets together: More than three times a week    Attends religious service: Patient refused    Active member of club or organization: Patient refused    Attends meetings of clubs or organizations: Never    Relationship status: Married  . Intimate partner violence:    Fear of current or ex partner: No    Emotionally abused: No    Physically abused: No    Forced sexual activity: No  Other Topics Concern  . Not on file  Social History Narrative   Married to [place] 91 years   Mortimer Fries has 3 daughters   Patient has one daughter, 3 grands   Likes to crochet      ROS:  General: Negative for anorexia, weight loss, fever, chills, fatigue, weakness. Eyes: Negative for vision changes.  ENT: Negative for hoarseness,   nasal congestion. See hpi CV: Negative for chest pain, angina, palpitations, dyspnea on exertion, peripheral edema.  Respiratory: Negative for dyspnea at rest, dyspnea on exertion, cough, sputum, wheezing.  GI: See history of present illness. GU:   Negative for dysuria, hematuria, urinary incontinence, urinary frequency, nocturnal urination.  MS: Negative for joint pain, low back pain.  Derm: Negative for rash or itching.  Neuro: Negative for weakness, abnormal sensation, seizure, frequent headaches, memory loss, confusion.  Psych: Negative for anxiety, depression, suicidal ideation, hallucinations.  Endo: Negative for unusual weight change.  Heme: Negative for bruising or bleeding. Allergy: Negative for rash or hives.    Physical Examination:  BP (!) 149/85   Pulse 81   Temp (!) 97.1 F (36.2 C) (Oral)   Ht 5\' 10"  (1.778 m)   Wt 191 lb 12.8 oz (87 kg)   BMI 27.52 kg/m    General: Well-nourished, well-developed in no acute distress.  Head: Normocephalic, atraumatic.   Eyes: Conjunctiva pink, no icterus. Mouth: Oropharyngeal mucosa moist and pink , no lesions erythema or exudate. Neck: Supple without thyromegaly, masses, or lymphadenopathy.  Lungs: Clear to auscultation bilaterally.  Heart: Regular rate and rhythm, no murmurs rubs or gallops.  Abdomen: Bowel sounds are normal, nontender, nondistended, no hepatosplenomegaly or masses, no abdominal bruits or    hernia , no rebound or guarding.   Rectal: not performed Extremities: No lower extremity edema. No clubbing or deformities.  Neuro: Alert and oriented x 4 , grossly normal neurologically.  Skin: Warm and dry, no rash or jaundice.   Psych: Alert and cooperative, normal mood and affect.  Labs: Lab Results  Component Value Date   INR 2.8 (H) 06/07/2017   INR 2.1 (H) 05/07/2017   INR 2.6 (H) 04/13/2017   Lab Results  Component Value Date   CREATININE 1.01 (H) 04/13/2017   BUN 20 04/13/2017   NA 138 04/13/2017   K 4.8 04/13/2017  CL 105 04/13/2017   CO2 29 04/13/2017   Lab Results  Component Value Date   ALT 12 04/13/2017   AST 16 04/13/2017   ALKPHOS 79 10/22/2016   BILITOT 0.6 04/13/2017   Lab Results  Component Value Date   WBC 5.7 04/13/2017    HGB 15.0 04/13/2017   HCT 43.8 04/13/2017   MCV 89.9 04/13/2017   PLT 125 (L) 04/13/2017     Imaging Studies: No results found.

## 2017-06-09 NOTE — Telephone Encounter (Signed)
Dr. Claiborne Billings,  Patient will require upper endoscopy with esophageal dilation in near future. We will need her off coumadin for 3-4 days prior to endoscopy but also for a period of time post procedure.   Does she require Lovenox bridging?   Laureen Ochs. Bernarda Caffey [facility] [phone] 4/10/20194:19 PM

## 2017-06-09 NOTE — Patient Instructions (Signed)
1. Start pantoprazole 30 minutes before supper daily to prevent night time acid reflux. 2. I will discuss upper endoscopy and coumadin management with Dr. Claiborne Billings and Dr. Gala Romney. Further recommendations to follow.    Gastroesophageal Reflux Disease, Adult Normally, food travels down the esophagus and stays in the stomach to be digested. However, when a person has gastroesophageal reflux disease (GERD), food and stomach acid move back up into the esophagus. When this happens, the esophagus becomes sore and inflamed. Over time, GERD can create small holes (ulcers) in the lining of the esophagus. What are the causes? This condition is caused by a problem with the muscle between the esophagus and the stomach (lower esophageal sphincter, or LES). Normally, the LES muscle closes after food passes through the esophagus to the stomach. When the LES is weakened or abnormal, it does not close properly, and that allows food and stomach acid to go back up into the esophagus. The LES can be weakened by certain dietary substances, medicines, and medical conditions, including:  Tobacco use.  Pregnancy.  Having a hiatal hernia.  Heavy alcohol use.  Certain foods and beverages, such as coffee, chocolate, onions, and peppermint.  What increases the risk? This condition is more likely to develop in:  People who have an increased body weight.  People who have connective tissue disorders.  People who use NSAID medicines.  What are the signs or symptoms? Symptoms of this condition include:  Heartburn.  Difficult or painful swallowing.  The feeling of having a lump in the throat.  Abitter taste in the mouth.  Bad breath.  Having a large amount of saliva.  Having an upset or bloated stomach.  Belching.  Chest pain.  Shortness of breath or wheezing.  Ongoing (chronic) cough or a night-time cough.  Wearing away of tooth enamel.  Weight loss.  Different conditions can cause chest pain.  Make sure to see your health care provider if you experience chest pain. How is this diagnosed? Your health care provider will take a medical history and perform a physical exam. To determine if you have mild or severe GERD, your health care provider may also monitor how you respond to treatment. You may also have other tests, including:  An endoscopy toexamine your stomach and esophagus with a small camera.  A test thatmeasures the acidity level in your esophagus.  A test thatmeasures how much pressure is on your esophagus.  A barium swallow or modified barium swallow to show the shape, size, and functioning of your esophagus.  How is this treated? The goal of treatment is to help relieve your symptoms and to prevent complications. Treatment for this condition may vary depending on how severe your symptoms are. Your health care provider may recommend:  Changes to your diet.  Medicine.  Surgery.  Follow these instructions at home: Diet  Follow a diet as recommended by your health care provider. This may involve avoiding foods and drinks such as: ? Coffee and tea (with or without caffeine). ? Drinks that containalcohol. ? Energy drinks and sports drinks. ? Carbonated drinks or sodas. ? Chocolate and cocoa. ? Peppermint and mint flavorings. ? Garlic and onions. ? Horseradish. ? Spicy and acidic foods, including peppers, chili powder, curry powder, vinegar, hot sauces, and barbecue sauce. ? Citrus fruit juices and citrus fruits, such as oranges, lemons, and limes. ? Tomato-based foods, such as red sauce, chili, salsa, and pizza with red sauce. ? Fried and fatty foods, such as donuts, french fries, potato  chips, and high-fat dressings. ? High-fat meats, such as hot dogs and fatty cuts of red and white meats, such as rib eye steak, sausage, ham, and bacon. ? High-fat dairy items, such as whole milk, butter, and cream cheese.  Eat small, frequent meals instead of large  meals.  Avoid drinking large amounts of liquid with your meals.  Avoid eating meals during the 2-3 hours before bedtime.  Avoid lying down right after you eat.  Do not exercise right after you eat. General instructions  Pay attention to any changes in your symptoms.  Take over-the-counter and prescription medicines only as told by your health care provider. Do not take aspirin, ibuprofen, or other NSAIDs unless your health care provider told you to do so.  Do not use any tobacco products, including cigarettes, chewing tobacco, and e-cigarettes. If you need help quitting, ask your health care provider.  Wear loose-fitting clothing. Do not wear anything tight around your waist that causes pressure on your abdomen.  Raise (elevate) the head of your bed 6 inches (15cm).  Try to reduce your stress, such as with yoga or meditation. If you need help reducing stress, ask your health care provider.  If you are overweight, reduce your weight to an amount that is healthy for you. Ask your health care provider for guidance about a safe weight loss goal.  Keep all follow-up visits as told by your health care provider. This is important. Contact a health care provider if:  You have new symptoms.  You have unexplained weight loss.  You have difficulty swallowing, or it hurts to swallow.  You have wheezing or a persistent cough.  Your symptoms do not improve with treatment.  You have a hoarse voice. Get help right away if:  You have pain in your arms, neck, jaw, teeth, or back.  You feel sweaty, dizzy, or light-headed.  You have chest pain or shortness of breath.  You vomit and your vomit looks like blood or coffee grounds.  You faint.  Your stool is bloody or black.  You cannot swallow, drink, or eat. This information is not intended to replace advice given to you by your health care provider. Make sure you discuss any questions you have with your health care  provider. Document Released: 11/26/2004 Document Revised: 07/17/2015 Document Reviewed: 06/13/2014 Elsevier Interactive Patient Education  2018 [employer] for Gastroesophageal Reflux Disease, Adult When you have gastroesophageal reflux disease (GERD), the foods you eat and your eating habits are very important. Choosing the right foods can help ease the discomfort of GERD. Consider working with a diet and nutrition specialist (dietitian) to help you make healthy food choices. What general guidelines should I follow? Eating plan  Choose healthy foods low in fat, such as fruits, vegetables, whole grains, low-fat dairy products, and lean meat, fish, and poultry.  Eat frequent, small meals instead of three large meals each day. Eat your meals slowly, in a relaxed setting. Avoid bending over or lying down until 2-3 hours after eating.  Limit high-fat foods such as fatty meats or fried foods.  Limit your intake of oils, butter, and shortening to less than 8 teaspoons each day.  Avoid the following: ? Foods that cause symptoms. These may be different for different people. Keep a food diary to keep track of foods that cause symptoms. ? Alcohol. ? Drinking large amounts of liquid with meals. ? Eating meals during the 2-3 hours before bed.  Cook foods using methods  other than frying. This may include baking, grilling, or broiling. Lifestyle   Maintain a healthy weight. Ask your health care provider what weight is healthy for you. If you need to lose weight, work with your health care provider to do so safely.  Exercise for at least 30 minutes on 5 or more days each week, or as told by your health care provider.  Avoid wearing clothes that fit tightly around your waist and chest.  Do not use any products that contain nicotine or tobacco, such as cigarettes and e-cigarettes. If you need help quitting, ask your health care provider.  Sleep with the head of your bed raised.  Use a wedge under the mattress or blocks under the bed frame to raise the head of the bed. What foods are not recommended? The items listed may not be a complete list. Talk with your dietitian about what dietary choices are best for you. Grains Pastries or quick breads with added fat. [place] toast. Vegetables Deep fried vegetables. [place] fries. Any vegetables prepared with added fat. Any vegetables that cause symptoms. For some people this may include tomatoes and tomato products, chili peppers, onions and garlic, and horseradish. Fruits Any fruits prepared with added fat. Any fruits that cause symptoms. For some people this may include citrus fruits, such as oranges, grapefruit, pineapple, and lemons. Meats and other protein foods High-fat meats, such as fatty beef or pork, hot dogs, ribs, ham, sausage, salami and bacon. Fried meat or protein, including fried fish and fried chicken. Nuts and nut butters. Dairy Whole milk and chocolate milk. Sour cream. Cream. Ice cream. Cream cheese. Milk shakes. Beverages Coffee and tea, with or without caffeine. Carbonated beverages. Sodas. Energy drinks. Fruit juice made with acidic fruits (such as orange or grapefruit). Tomato juice. Alcoholic drinks. Fats and oils Butter. Margarine. Shortening. Ghee. Sweets and desserts Chocolate and cocoa. Donuts. Seasoning and other foods Pepper. Peppermint and spearmint. Any condiments, herbs, or seasonings that cause symptoms. For some people, this may include curry, hot sauce, or vinegar-based salad dressings. Summary  When you have gastroesophageal reflux disease (GERD), food and lifestyle choices are very important to help ease the discomfort of GERD.  Eat frequent, small meals instead of three large meals each day. Eat your meals slowly, in a relaxed setting. Avoid bending over or lying down until 2-3 hours after eating.  Limit high-fat foods such as fatty meat or fried foods. This information is not  intended to replace advice given to you by your health care provider. Make sure you discuss any questions you have with your health care provider. Document Released: 02/16/2005 Document Revised: 02/18/2016 Document Reviewed: 02/18/2016 Elsevier Interactive Patient Education  [employer].

## 2017-06-10 NOTE — Progress Notes (Signed)
CC'ED TO PCP 

## 2017-06-11 NOTE — Telephone Encounter (Signed)
Patient with diagnosis of Atrial fibrillation and aortic valve replecement on warfarin for anticoagulation.    Procedure: endoscopy Date of procedure: TBD  Per office protocol, patient can hold warfarin prior to procedure.   Bridging with Lovenox (enoxaparin) around procedure is indicated.   **This patient warfarin is NOT follow by [place]. Please contact coumadin clinic in charge with day of procedure to arrange bridging plan*    Marriah Sanderlin Rodriguez-Guzman PharmD, BCPS, [place] [address] 06/11/2017 10:08 AM

## 2017-06-11 NOTE — Telephone Encounter (Signed)
Thank you for your input.   Patient is not followed by a coumadin clinic, her PCP manages her anticoagulation.   I will make arrangements for Lovenox bridging.

## 2017-06-14 ENCOUNTER — Other Ambulatory Visit: Payer: Self-pay | Admitting: Allergy & Immunology

## 2017-06-14 NOTE — Telephone Encounter (Signed)
   Primary Cardiologist: Shelva Majestic, MD  Chart reviewed as part of pre-operative protocol coverage. Given past medical history and time since last visit, based on ACC/AHA guidelines, Katelyn [name] would be at acceptable risk for the planned procedure without further cardiovascular testing.   See attached anticoagulant recommendations.   I will route this recommendation to the requesting party via Epic fax function and remove from pre-op pool.  Please call with questions.  [name], NP 06/14/2017, 2:12 PM

## 2017-06-16 NOTE — Telephone Encounter (Signed)
   Please schedule EGD/ED with RMR. Patient will need Lovenox bridge. Please let me know when scheduled and I will give further instructions.

## 2017-06-17 ENCOUNTER — Other Ambulatory Visit: Payer: Self-pay

## 2017-06-17 DIAGNOSIS — K219 Gastro-esophageal reflux disease without esophagitis: Secondary | ICD-10-CM

## 2017-06-17 DIAGNOSIS — R1310 Dysphagia, unspecified: Secondary | ICD-10-CM

## 2017-06-17 DIAGNOSIS — R1319 Other dysphagia: Secondary | ICD-10-CM

## 2017-06-17 NOTE — Telephone Encounter (Signed)
Called pt. EGD/DIL w/RMR scheduled for 08/18/17 at 2:00pm. She is aware she will be holding Coumadin prior to procedure and will need Lovenox bridge. Instructions will be mailed after LSL gives further instructions.  Magda Paganini, she requests for Lovenox rx be sent to [facility] in [place].

## 2017-06-25 MED ORDER — ENOXAPARIN SODIUM 100 MG/ML ~~LOC~~ SOLN: 1.0000 mg/kg | Freq: Two times a day (BID) | SUBCUTANEOUS | 0 refills | Status: DC

## 2017-06-25 NOTE — Telephone Encounter (Signed)
EGD, Coumadin/Lovenox instructions mailed to pt.

## 2017-06-25 NOTE — Addendum Note (Signed)
Addended by: Mahala Menghini on: 06/25/2017 07:57 AM   Modules accepted: Orders

## 2017-06-25 NOTE — Telephone Encounter (Signed)
Please follow the Medication Instructions below for your procedure:  You need to hold your coumadin four days before your procedure and start a Lovenox bridge.  Your procedure is scheduled for August 18, 2017. RX for Lovenox has been sent to your pharmacy. Please pick up asap in case they have to order it, etc.     August 13, 2017 Last dose of Coumadin/warfarin   August 14, 2017  No Coumadin/warfarin   August 15, 2017  Begin Lovenox/enoxaparin [place] 87 mg at 10am and 87 mg at 10pm   August 16, 2017 No Coumadin/warfarin Lovenox/enoxaparin [place] 87 mg at10am and 87 mg at10pm  August 17, 2017 No Coumadin/warfarin Lovenox/enoxaparin [place] 87 mg at 10amand 87 mg at 10pm  August 18, 2017 Day of procedue No Coumadin/warfarin No Lovenox/enoxaparin  Dr. Gala Romney will provide you with further instructions about your coumadin and Lovenox after your procedure.     You will be instructed when to get your next INR checked.

## 2017-07-02 ENCOUNTER — Other Ambulatory Visit: Payer: Self-pay | Admitting: Family Medicine

## 2017-07-13 ENCOUNTER — Ambulatory Visit: Payer: Medicare Other | Admitting: Family Medicine

## 2017-07-13 ENCOUNTER — Other Ambulatory Visit: Payer: Self-pay | Admitting: Family Medicine

## 2017-07-13 DIAGNOSIS — Z952 Presence of prosthetic heart valve: Secondary | ICD-10-CM | POA: Diagnosis not present

## 2017-07-14 LAB — PROTIME-INR
INR: 2.9 — ABNORMAL HIGH
Prothrombin Time: 30.6 s — ABNORMAL HIGH (ref 9.0–11.5)

## 2017-07-19 ENCOUNTER — Encounter: Payer: Self-pay | Admitting: Family Medicine

## 2017-07-19 ENCOUNTER — Ambulatory Visit (HOSPITAL_COMMUNITY)
Admission: RE | Admit: 2017-07-19 | Discharge: 2017-07-19 | Disposition: A | Discharge status: Home / Self Care | Payer: Medicare Other | Source: Ambulatory Visit | Attending: Cardiovascular Disease | Admitting: Cardiovascular Disease

## 2017-07-19 DIAGNOSIS — I6523 Occlusion and stenosis of bilateral carotid arteries: Secondary | ICD-10-CM | POA: Insufficient documentation

## 2017-07-20 ENCOUNTER — Other Ambulatory Visit: Payer: Self-pay | Admitting: *Deleted

## 2017-07-20 ENCOUNTER — Telehealth: Payer: Self-pay | Admitting: Family Medicine

## 2017-07-20 DIAGNOSIS — I6523 Occlusion and stenosis of bilateral carotid arteries: Secondary | ICD-10-CM

## 2017-07-20 NOTE — Telephone Encounter (Signed)
CB#: [phone]  Patient came in requesting INR results, last INR is 2.9 and her range is 2.0-3.0  Patient wants to know if she needs to make any changes to her coumadin

## 2017-07-20 NOTE — Telephone Encounter (Signed)
Advised patient of Dr.Hagler's recommendations with verbal understanding. She has an appt to establish care on 5/31 and I advised her to discuss her coumadin and INR testing with Dr.Hagler at that visit. She verbalized understanding.

## 2017-07-20 NOTE — Telephone Encounter (Signed)
Please call patient and find out who has been managing her Coumadin.  It appears that she is seeing cardiology and they have followed this in the past.  I have never seen this patient.  She will need to schedule an office visit.

## 2017-07-21 ENCOUNTER — Telehealth: Payer: Self-pay

## 2017-07-21 NOTE — Telephone Encounter (Signed)
Advised patient to go to the lab and have INR drawn prior to appt on 5/31 per Dr.Hagler with verbal understanding.

## 2017-07-28 ENCOUNTER — Telehealth: Payer: Self-pay

## 2017-07-28 DIAGNOSIS — I4891 Unspecified atrial fibrillation: Secondary | ICD-10-CM | POA: Diagnosis not present

## 2017-07-29 LAB — PROTIME-INR
INR: 3.4 — ABNORMAL HIGH
PROTHROMBIN TIME: 35.5 s — AB (ref 9.0–11.5)

## 2017-07-30 ENCOUNTER — Other Ambulatory Visit: Payer: Self-pay

## 2017-07-30 ENCOUNTER — Ambulatory Visit (INDEPENDENT_AMBULATORY_CARE_PROVIDER_SITE_OTHER): Payer: Medicare Other | Admitting: Family Medicine

## 2017-07-30 ENCOUNTER — Encounter: Payer: Self-pay | Admitting: Family Medicine

## 2017-07-30 VITALS — BP 126/60 | HR 61 | Temp 98.6°F | Resp 14 | Ht 70.0 in | Wt 194.0 lb

## 2017-07-30 DIAGNOSIS — Z952 Presence of prosthetic heart valve: Secondary | ICD-10-CM

## 2017-07-30 DIAGNOSIS — I10 Essential (primary) hypertension: Secondary | ICD-10-CM | POA: Diagnosis not present

## 2017-07-30 DIAGNOSIS — Z79890 Hormone replacement therapy (postmenopausal): Secondary | ICD-10-CM

## 2017-07-30 DIAGNOSIS — E559 Vitamin D deficiency, unspecified: Secondary | ICD-10-CM | POA: Diagnosis not present

## 2017-07-30 DIAGNOSIS — J439 Emphysema, unspecified: Secondary | ICD-10-CM

## 2017-07-30 DIAGNOSIS — R319 Hematuria, unspecified: Secondary | ICD-10-CM | POA: Diagnosis not present

## 2017-07-30 DIAGNOSIS — Z23 Encounter for immunization: Secondary | ICD-10-CM

## 2017-07-30 MED ORDER — VITAMIN D (ERGOCALCIFEROL) 1.25 MG (50000 UNIT) PO CAPS: 50000.0000 [IU] | ORAL_CAPSULE | ORAL | 0 refills | Status: DC

## 2017-07-30 MED ORDER — ESTRADIOL 2 MG PO TABS: 2.0000 mg | ORAL_TABLET | Freq: Every day | ORAL | 0 refills | Status: DC

## 2017-07-30 NOTE — Progress Notes (Signed)
Patient ID: Katelyn [name], female    DOB: March 26, 1940, 77 y.o.   MRN: [id number]  Chief Complaint  Patient presents with  . Establish Care    former Katelyn [name] patient    Allergies Adhesive [tape]; Latex; and Tetanus toxoids  Subjective:   Katelyn [name] is a 77 y.o. female who presents to [facility] today.  HPI Katelyn [name] presents today to establish care.  She is previously been followed by Dr. Lysle [name].  She has a history significant for peripheral artery disease and coronary artery disease.  She is followed by multiple specialists for her medical problems. She reports that yesterday she was bending down and when she stood up she felt a little bit dizzy.  She reports that this is happened before and she has had a work-up.  She reports she was even seen by ear nose and throat and they told her that everything was okay.  She denies any ear pain.  Has not had any subsequent episodes.  Denies any chest pain, shortness of breath, or swelling in her extremities.  Reports her mood is good.  Is taking all her medications.  Had labs done for her INR a couple days ago.  Has been on this dose for long time.  Reports she never misses a dose. Her care team includes: Dr. Kelly/Cardiology Dr. Mila Merry Dr. Gallaghger/Allergies Dr. Enedina Finner  He would like to discuss her hormone replacement therapy.  She reports that she cannot live without this medication.  She reports her hot flashes are so severe she stops this medication that she cannot make it.  She reports that it would be unbearable.  She reports hot flashes and sweating.  She reports that she needs a refill of this medication.  Has been on this long-term since her hysterectomy.  She reports that she has been counseled by many doctors regarding the risks.  She understands that she is at increased risk of heart attack, stroke, cancer, blood clots.  She reports she understands all these things but still wishes to take the  medication.  Nuys any symptoms of urinary tract infection.  Denies any dysuria.  Denies seeing any blood in her urine.  Did smoke approximately 2 packs a day for over 50 years.  Quit smoking at the time of her valve replacement. Lives with her husband.  Has a daughter.  Enjoys working in her flower beds.  Denies feeling sad, down, or depressed.  Appetite is good.  Reports that she does not use any inhalers for her breathing.   Past Medical History:  Diagnosis Date  . Allergy    rhinitis  . Aortic stenosis, severe   . Aortic stenosis, severe   . Arthritis    spine and various joints  . Cataract   . Childhood asthma   . Chronic bronchitis ([place])       . Claudication ([place])   . Coronary artery disease    a. April 2012 which revealed mid RCA occlusion with collaterals, 50% LAD stenosis, 30% circumflex stenosis, and 40% marginal stenosis  . GERD (gastroesophageal reflux disease)   . Heart murmur   . Hyperlipidemia   . Hypertension   . LUMBAR RADICULOPATHY, RIGHT 12/21/2007   Qualifier: Diagnosis of  By: Aline Brochure MD, Dorothyann Peng    . Peripheral vascular disease ([place])   . 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 Seroma,  postoperative    Left Groin  . Severe aortic valve stenosis 08/31/2014  . Stroke [facility]) 1982 X 2   "a little bit weaker on the left side since" (08/29/2014)    Past Surgical History:  Procedure Laterality Date  . AORTO-FEMORAL BYPASS GRAFT Bilateral 06/27/10  . BASAL CELL CARCINOMA EXCISION Left    face  . CARDIAC CATHETERIZATION N/A 07/25/2014   Procedure: Right/Left Heart Cath and Coronary Angiography;  Surgeon: Troy Sine, MD;  Location: [place] [facility];  Service: Cardiovascular;  Laterality: N/A;  . CATARACT EXTRACTION, BILATERAL Bilateral 09/29/2016  . COLONOSCOPY  2008   Dr. Laural Golden: normal.   . DILATION AND CURETTAGE OF UTERUS  "2 or 3"  .  MULTIPLE TOOTH EXTRACTIONS    . PR VEIN BYPASS GRAFT,AORTO-FEM-POP  06/12/10  . TEE WITHOUT CARDIOVERSION N/A 08/31/2014   Procedure: TRANSESOPHAGEAL ECHOCARDIOGRAM (TEE);  Surgeon: Rexene Alberts, MD;  Location: [place];  Service: Open Heart Surgery;  Laterality: N/A;  . TRANSCATHETER AORTIC VALVE REPLACEMENT, TRANSFEMORAL N/A 08/31/2014   Procedure: TRANSCATHETER AORTIC VALVE REPLACEMENT, TRANSFEMORAL approach;  Surgeon: Rexene Alberts, MD;  Location: [place];  Service: Open Heart Surgery;  Laterality: N/A;  . TUBAL LIGATION  1970's  . VAGINAL HYSTERECTOMY  1970's   Partial     Family History  Problem Relation Age of Onset  . Heart disease Mother 67       died young  . Varicose Veins Mother   . Stroke Father   . Hypertension Father   . Heart disease Father 73       Aneurysm  . Heart attack Father   . Arthritis Brother   . Stroke Brother   . Diabetes Daughter   . Arthritis Daughter   . Allergic rhinitis Neg Hx   . Angioedema Neg Hx   . Asthma Neg Hx   . Atopy Neg Hx   . Eczema Neg Hx   . Immunodeficiency Neg Hx   . Urticaria Neg Hx      Social History   Socioeconomic History  . Marital status: Married    Spouse name: Katelyn [name]  . Number of children: 1  . Years of education: Not on file  . Highest education level: High school graduate  Occupational History  . 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 Financial resource strain: Not very hard  . Food insecurity:    Worry: Never true    Inability: Never true  . Transportation needs:    Medical: No    Non-medical: No  Tobacco Use  . Smoking status: Former Smoker    Packs/day: 2.00    Years: 56.00    Pack years: 112.00    Types: Cigarettes    Start date: 03/02/1952    Last attempt to quit: 06/01/2010    Years since quitting: 7.1  . Smokeless tobacco: Never Used  . Tobacco comment: HAD WEANED DOWN TO 1/4 PK A DAY BEFORE D/C  Substance and Sexual Activity  . Alcohol use: No    Alcohol/week: 0.0 oz     Frequency: Never    Comment: 08/29/2014 "never drank much; quit in the 1990's"  . Drug use: No  . Sexual activity: Yes    Partners: Male  Lifestyle  . Physical activity:    Days per week: 0 days    Minutes per session: 0 min  . Stress: Only a little  Relationships  . Social connections:    Talks on  phone: More than three times a week    Gets together: More than three times a week    Attends religious service: Patient refused    Active member of club or organization: Patient refused    Attends meetings of clubs or organizations: Never    Relationship status: Married  Other Topics Concern  . Not on file  Social History Narrative   Married. Has one daughter from first marriage, second husband has three daughters.    Daughter livers here.      Katelyn [name] has 3 daughters   Patient has one daughter, 3 grands   Likes to crochet   Works in [employer].    Current Outpatient Medications on File Prior to Visit  Medication Sig Dispense Refill  . amLODipine (NORVASC) 5 MG tablet take 1 tablet by mouth once daily 90 tablet 3  . enoxaparin (LOVENOX) 100 MG/ML injection Inject 0.85 mLs (85 mg total) into the skin every 12 (twelve) hours. At 10am and 10pm as directed 12 Syringe 0  . furosemide (LASIX) 20 MG tablet Take 20 mg by mouth as needed (fluid retention).     Marland Kitchen ipratropium (ATROVENT) 0.06 % nasal spray Place 1 spray into both nostrils every 6 (six) hours as needed for rhinitis. 16 mL 5  . irbesartan (AVAPRO) 150 MG tablet Take 1 tablet (150 mg total) by mouth daily. 90 tablet 3  . meloxicam (MOBIC) 15 MG tablet Take 15 mg by mouth daily.  11  . metoprolol succinate (TOPROL-XL) 100 MG 24 hr tablet Take 1 tablet (100 mg total) by mouth daily. 90 tablet 3  . nitroGLYCERIN (NITROSTAT) 0.4 MG SL tablet Place 1 tablet (0.4 mg total) under the tongue every 5 (five) minutes as needed for chest pain. 25 tablet 6  . pantoprazole (PROTONIX) 40 MG tablet Take 1 tablet (40 mg total) by mouth daily before  supper. 30 tablet 5  . potassium chloride (KLOR-CON) 10 MEQ CR tablet Take 10 mEq by mouth as needed (when taking Lasix).     . rosuvastatin (CRESTOR) 20 MG tablet TAKE 1 TABLET BY MOUTH ONCE DAILY 90 tablet 0  . warfarin (COUMADIN) 3 MG tablet Take 3 mg by mouth daily at 6 PM.      No current facility-administered medications on file prior to visit.     Review of Systems   Objective:   BP 126/60 (BP Location: Left Arm, Patient Position: Sitting, Cuff Size: Large)   Pulse 61   Temp 98.6 F (37 C) (Temporal)   Resp 14   Ht 5\' 10"  (1.778 m)   Wt 194 lb 0.6 oz (88 kg)   SpO2 94%   BMI 27.84 kg/m   Physical Exam  Constitutional: She is oriented to person, place, and time. She appears well-developed and well-nourished. No distress.  HENT:  Head: Normocephalic and atraumatic.  Right Ear: External ear normal.  Left Ear: External ear normal.  Nose: Nose normal.  Mouth/Throat: Oropharynx is clear and moist. No oropharyngeal exudate.  Eyes: Pupils are equal, round, and reactive to light. Conjunctivae are normal. No scleral icterus.  Neck: Normal range of motion. Neck supple. No JVD present. No thyromegaly present.  Cardiovascular: Normal rate, regular rhythm and normal heart sounds.  Pulmonary/Chest: Effort normal and breath sounds normal. No respiratory distress.  Abdominal: Soft. Bowel sounds are normal.  Musculoskeletal: Normal range of motion.  Lymphadenopathy:    She has no cervical adenopathy.  Neurological: She is alert and oriented to person, place, and  time. No cranial nerve deficit.  Skin: Skin is warm and dry. Capillary refill takes less than 2 seconds.  Psychiatric: She has a normal mood and affect. Her behavior is normal. Judgment and thought content normal.  Nursing note and vitals reviewed.  Depression screen [facility] 2/9 07/30/2017 03/10/2017  Decreased Interest 0 0  Down, Depressed, Hopeless 0 0  PHQ - 2 Score 0 0     Assessment and Plan  1. Pulmonary emphysema,  unspecified emphysema type ([place]) Follow up with allergy /asthma as directed.  - Pneumococcal conjugate vaccine 13-valent IM Patient with long history of tobacco use.  Quit greater than 10 years ago. 2. S/P TAVR (transcatheter aortic valve replacement) Patient was counseled concerning her PT/INR.  She is to return to lab in 10 days for recheck.  Continue current dosing. - INR/PT  3. Hematuria, unspecified type Review several urinalysis with evidence of red blood cells.  Will send for reflex microscopy/urinalysis. - Urinalysis, Routine w reflex microscopic  4. Vitamin D deficiency 3 months ago level of 7.  Will refill at this time and plan to check in about 6 months. - Vitamin D, Ergocalciferol, (DRISDOL) 50000 units CAPS capsule; Take 1 capsule (50,000 Units total) by mouth every 7 (seven) days.  Dispense: 12 capsule; Refill: 0  5. Essential hypertension Lifestyle modifications discussed with patient including a diet emphasizing vegetables, fruits, and whole grains. Limiting intake of sodium to less than 2,400 mg per day.  Recommendations discussed include consuming low-fat dairy products, poultry, fish, legumes, non-tropical vegetable oils, and nuts; and limiting intake of sweets, sugar-sweetened beverages, and red meat. Discussed following a plan such as the Dietary Approaches to Stop Hypertension (DASH) diet. Patient to read up on this diet.    6. Current long-term use of postmenopausal hormone replacement therapy Patient counseled in detail regarding the risks of medication. Told to call or return to clinic if develop any worrisome signs or symptoms. Patient voiced understanding.  Patient understands the cardiovascular risks and malignancy risks associated with this medication.  - estradiol (ESTRACE) 2 MG tablet; Take 1 tablet (2 mg total) by mouth daily.  Dispense: 90 tablet; Refill: 0  She was encouraged to keep her scheduled visits with cardiology.  Patient office visit today was  greater than 30 minutes.  Greater than 50% of office visit spent counseling and coordinating care.  We have reviewed her history.  She understands her medication use.  She also understands the risk versus benefits of her medication. Return in about 2 months (around 09/29/2017). Caren Macadam, MD 07/30/2017

## 2017-07-30 NOTE — Patient Instructions (Addendum)
Calcium 600 mg tablets, one a day  Centrum silver, one a day

## 2017-08-05 ENCOUNTER — Encounter: Payer: Self-pay | Admitting: Family Medicine

## 2017-08-06 ENCOUNTER — Other Ambulatory Visit: Payer: Self-pay | Admitting: Cardiovascular Disease

## 2017-08-06 ENCOUNTER — Other Ambulatory Visit: Payer: Self-pay | Admitting: Family Medicine

## 2017-08-06 ENCOUNTER — Encounter: Payer: Self-pay | Admitting: Family Medicine

## 2017-08-06 DIAGNOSIS — E559 Vitamin D deficiency, unspecified: Secondary | ICD-10-CM

## 2017-08-06 DIAGNOSIS — Z79890 Hormone replacement therapy (postmenopausal): Secondary | ICD-10-CM

## 2017-08-06 NOTE — Telephone Encounter (Signed)
Rx sent to pharmacy   

## 2017-08-11 ENCOUNTER — Telehealth: Payer: Self-pay | Admitting: Family Medicine

## 2017-08-11 DIAGNOSIS — Z952 Presence of prosthetic heart valve: Secondary | ICD-10-CM | POA: Diagnosis not present

## 2017-08-11 DIAGNOSIS — R319 Hematuria, unspecified: Secondary | ICD-10-CM | POA: Diagnosis not present

## 2017-08-11 LAB — URINALYSIS, ROUTINE W REFLEX MICROSCOPIC
Bilirubin Urine: NEGATIVE
Glucose, UA: NEGATIVE
HGB URINE DIPSTICK: NEGATIVE
Leukocytes, UA: NEGATIVE
NITRITE: NEGATIVE
PROTEIN: NEGATIVE
Specific Gravity, Urine: 1.018 (ref 1.001–1.03)

## 2017-08-11 LAB — PROTIME-INR
INR: 2.8 — AB
PROTHROMBIN TIME: 29.3 s — AB (ref 9.0–11.5)

## 2017-08-11 NOTE — Telephone Encounter (Signed)
Please advise that INR was 2.8. This is good. Continue current coumadin dosing and RTC in one month for recheck. Gwen Her. Mannie Stabile, MD

## 2017-08-12 NOTE — Telephone Encounter (Signed)
Spoke with patient and advised of recent INR result and Dr.Hagler's recommendations with verbal understanding.

## 2017-08-17 ENCOUNTER — Encounter: Payer: Self-pay | Admitting: Family Medicine

## 2017-08-17 ENCOUNTER — Telehealth: Payer: Self-pay | Admitting: General Practice

## 2017-08-17 NOTE — Telephone Encounter (Signed)
Per Threasa Beards ok for the patient to come in at 6 am for a 7 am procedure tomorrow.   I spoke with the patient and made her aware of the change and not to have anything to eat or drink after midnight tonight.

## 2017-08-18 ENCOUNTER — Ambulatory Visit (HOSPITAL_COMMUNITY)
Admission: RE | Admit: 2017-08-18 | Discharge: 2017-08-18 | Disposition: A | Discharge status: Home / Self Care | Payer: Medicare Other | Source: Ambulatory Visit | Attending: Internal Medicine | Admitting: Internal Medicine

## 2017-08-18 ENCOUNTER — Encounter (HOSPITAL_COMMUNITY)
Admission: RE | Disposition: A | Discharge status: Home / Self Care | Payer: Self-pay | Source: Ambulatory Visit | Attending: Internal Medicine

## 2017-08-18 ENCOUNTER — Encounter (HOSPITAL_COMMUNITY): Payer: Self-pay | Admitting: *Deleted

## 2017-08-18 ENCOUNTER — Other Ambulatory Visit: Payer: Self-pay

## 2017-08-18 DIAGNOSIS — Z8249 Family history of ischemic heart disease and other diseases of the circulatory system: Secondary | ICD-10-CM | POA: Insufficient documentation

## 2017-08-18 DIAGNOSIS — I10 Essential (primary) hypertension: Secondary | ICD-10-CM | POA: Diagnosis not present

## 2017-08-18 DIAGNOSIS — I739 Peripheral vascular disease, unspecified: Secondary | ICD-10-CM | POA: Diagnosis not present

## 2017-08-18 DIAGNOSIS — M1990 Unspecified osteoarthritis, unspecified site: Secondary | ICD-10-CM | POA: Insufficient documentation

## 2017-08-18 DIAGNOSIS — Z952 Presence of prosthetic heart valve: Secondary | ICD-10-CM | POA: Diagnosis not present

## 2017-08-18 DIAGNOSIS — K219 Gastro-esophageal reflux disease without esophagitis: Secondary | ICD-10-CM | POA: Diagnosis not present

## 2017-08-18 DIAGNOSIS — Z8673 Personal history of transient ischemic attack (TIA), and cerebral infarction without residual deficits: Secondary | ICD-10-CM | POA: Insufficient documentation

## 2017-08-18 DIAGNOSIS — Z7901 Long term (current) use of anticoagulants: Secondary | ICD-10-CM | POA: Insufficient documentation

## 2017-08-18 DIAGNOSIS — R1319 Other dysphagia: Secondary | ICD-10-CM

## 2017-08-18 DIAGNOSIS — R011 Cardiac murmur, unspecified: Secondary | ICD-10-CM | POA: Insufficient documentation

## 2017-08-18 DIAGNOSIS — I2510 Atherosclerotic heart disease of native coronary artery without angina pectoris: Secondary | ICD-10-CM | POA: Diagnosis not present

## 2017-08-18 DIAGNOSIS — Z87891 Personal history of nicotine dependence: Secondary | ICD-10-CM | POA: Diagnosis not present

## 2017-08-18 DIAGNOSIS — K449 Diaphragmatic hernia without obstruction or gangrene: Secondary | ICD-10-CM | POA: Insufficient documentation

## 2017-08-18 DIAGNOSIS — E785 Hyperlipidemia, unspecified: Secondary | ICD-10-CM | POA: Insufficient documentation

## 2017-08-18 DIAGNOSIS — I350 Nonrheumatic aortic (valve) stenosis: Secondary | ICD-10-CM | POA: Insufficient documentation

## 2017-08-18 DIAGNOSIS — Z79899 Other long term (current) drug therapy: Secondary | ICD-10-CM | POA: Insufficient documentation

## 2017-08-18 DIAGNOSIS — R1310 Dysphagia, unspecified: Secondary | ICD-10-CM

## 2017-08-18 HISTORY — PX: ESOPHAGOGASTRODUODENOSCOPY: SHX5428

## 2017-08-18 HISTORY — PX: MALONEY DILATION: SHX5535

## 2017-08-18 SURGERY — EGD (ESOPHAGOGASTRODUODENOSCOPY)
Anesthesia: Moderate Sedation

## 2017-08-18 MED ORDER — LIDOCAINE VISCOUS HCL 2 % MT SOLN
OROMUCOSAL | Status: DC | PRN
  Administered 2017-08-18: 4 mL via OROMUCOSAL

## 2017-08-18 MED ORDER — SODIUM CHLORIDE 0.9 % IV SOLN
INTRAVENOUS | Status: DC
  Administered 2017-08-18: 06:00:00 via INTRAVENOUS

## 2017-08-18 MED ORDER — STERILE WATER FOR IRRIGATION IR SOLN
Status: DC | PRN
  Administered 2017-08-18: 100 mL

## 2017-08-18 MED ORDER — ONDANSETRON HCL 4 MG/2ML IJ SOLN
INTRAMUSCULAR | Status: AC
  Filled 2017-08-18: qty 2

## 2017-08-18 MED ORDER — MIDAZOLAM HCL 5 MG/5ML IJ SOLN
INTRAMUSCULAR | Status: DC | PRN
  Administered 2017-08-18 (×2): 1 mg via INTRAVENOUS

## 2017-08-18 MED ORDER — MEPERIDINE HCL 100 MG/ML IJ SOLN
INTRAMUSCULAR | Status: AC
  Filled 2017-08-18: qty 2

## 2017-08-18 MED ORDER — MEPERIDINE HCL 100 MG/ML IJ SOLN
INTRAMUSCULAR | Status: DC | PRN
  Administered 2017-08-18 (×2): 25 mg via INTRAVENOUS

## 2017-08-18 MED ORDER — MIDAZOLAM HCL 5 MG/5ML IJ SOLN
INTRAMUSCULAR | Status: AC
  Filled 2017-08-18: qty 10

## 2017-08-18 MED ORDER — ONDANSETRON HCL 4 MG/2ML IJ SOLN
INTRAMUSCULAR | Status: DC | PRN
  Administered 2017-08-18: 4 mg via INTRAVENOUS

## 2017-08-18 MED ORDER — LIDOCAINE VISCOUS HCL 2 % MT SOLN
OROMUCOSAL | Status: AC
  Filled 2017-08-18: qty 15

## 2017-08-18 NOTE — Discharge Instructions (Signed)
EGD Discharge instructions Please read the instructions outlined below and refer to this sheet in the next few weeks. These discharge instructions provide you with general information on caring for yourself after you leave the hospital. Your doctor may also give you specific instructions. While your treatment has been planned according to the most current medical practices available, unavoidable complications occasionally occur. If you have any problems or questions after discharge, please call your doctor. ACTIVITY  You may resume your regular activity but move at a slower pace for the next 24 hours.   Take frequent rest periods for the next 24 hours.   Walking will help expel (get rid of) the air and reduce the bloated feeling in your abdomen.   No driving for 24 hours (because of the anesthesia (medicine) used during the test).   You may shower.   Do not sign any important legal documents or operate any machinery for 24 hours (because of the anesthesia used during the test).  NUTRITION  Drink plenty of fluids.   You may resume your normal diet.   Begin with a light meal and progress to your normal diet.   Avoid alcoholic beverages for 24 hours or as instructed by your caregiver.  MEDICATIONS  You may resume your normal medications unless your caregiver tells you otherwise.  WHAT YOU CAN EXPECT TODAY  You may experience abdominal discomfort such as a feeling of fullness or gas pains.  FOLLOW-UP  Your doctor will discuss the results of your test with you.  SEEK IMMEDIATE MEDICAL ATTENTION IF ANY OF THE FOLLOWING OCCUR:  Excessive nausea (feeling sick to your stomach) and/or vomiting.   Severe abdominal pain and distention (swelling).   Trouble swallowing.   Temperature over 101 F (37.8 C).   Rectal bleeding or vomiting of blood.    GERD information provided  Continue Protonix 40 mg daily  Resume Coumadin today  Resume Lovenox tomorrow morning; continue  Lovenox until INR  Therapeutic  Office visit with [place] in 6 months  PATIENT INSTRUCTIONS POST-ANESTHESIA  IMMEDIATELY FOLLOWING SURGERY:  Do not drive or operate machinery for the first twenty four hours after surgery.  Do not make any important decisions for twenty four hours after surgery or while taking narcotic pain medications or sedatives.  If you develop intractable nausea and vomiting or a severe headache please notify your doctor immediately.  FOLLOW-UP:  Please make an appointment with your surgeon as instructed. You do not need to follow up with anesthesia unless specifically instructed to do so.  WOUND CARE INSTRUCTIONS (if applicable):  Keep a dry clean dressing on the anesthesia/puncture wound site if there is drainage.  Once the wound has quit draining you may leave it open to air.  Generally you should leave the bandage intact for twenty four hours unless there is drainage.  If the epidural site drains for more than 36-48 hours please call the anesthesia department.  QUESTIONS?:  Please feel free to call your physician or the hospital operator if you have any questions, and they will be happy to assist you.      Gastroesophageal Reflux Disease, Adult Normally, food travels down the esophagus and stays in the stomach to be digested. If a person has gastroesophageal reflux disease (GERD), food and stomach acid move back up into the esophagus. When this happens, the esophagus becomes sore and swollen (inflamed). Over time, GERD can make small holes (ulcers) in the lining of the esophagus. Follow these instructions at home: Diet  Follow a diet as told by your doctor. You may need to avoid foods and drinks such as: ? Coffee and tea (with or without caffeine). ? Drinks that contain alcohol. ? Energy drinks and sports drinks. ? Carbonated drinks or sodas. ? Chocolate and cocoa. ? Peppermint and mint flavorings. ? Garlic and onions. ? Horseradish. ? Spicy and acidic foods, such as  peppers, chili powder, curry powder, vinegar, hot sauces, and BBQ sauce. ? Citrus fruit juices and citrus fruits, such as oranges, lemons, and limes. ? Tomato-based foods, such as red sauce, chili, salsa, and pizza with red sauce. ? Fried and fatty foods, such as donuts, french fries, potato chips, and high-fat dressings. ? High-fat meats, such as hot dogs, rib eye steak, sausage, ham, and bacon. ? High-fat dairy items, such as whole milk, butter, and cream cheese.  Eat small meals often. Avoid eating large meals.  Avoid drinking large amounts of liquid with your meals.  Avoid eating meals during the 2-3 hours before bedtime.  Avoid lying down right after you eat.  Do not exercise right after you eat. General instructions  Pay attention to any changes in your symptoms.  Take over-the-counter and prescription medicines only as told by your doctor. Do not take aspirin, ibuprofen, or other NSAIDs unless your doctor says it is okay.  Do not use any tobacco products, including cigarettes, chewing tobacco, and e-cigarettes. If you need help quitting, ask your doctor.  Wear loose clothes. Do not wear anything tight around your waist.  Raise (elevate) the head of your bed about 6 inches (15 cm).  Try to lower your stress. If you need help doing this, ask your doctor.  If you are overweight, lose an amount of weight that is healthy for you. Ask your doctor about a safe weight loss goal.  Keep all follow-up visits as told by your doctor. This is important. Contact a doctor if:  You have new symptoms.  You lose weight and you do not know why it is happening.  You have trouble swallowing, or it hurts to swallow.  You have wheezing or a cough that keeps happening.  Your symptoms do not get better with treatment.  You have a hoarse voice. Get help right away if:  You have pain in your arms, neck, jaw, teeth, or back.  You feel sweaty, dizzy, or light-headed.  You have chest pain  or shortness of breath.  You throw up (vomit) and your throw up looks like blood or coffee grounds.  You pass out (faint).  Your poop (stool) is bloody or black.  You cannot swallow, drink, or eat. This information is not intended to replace advice given to you by your health care provider. Make sure you discuss any questions you have with your health care provider. Document Released: 08/05/2007 Document Revised: 07/25/2015 Document Reviewed: 06/13/2014 Elsevier Interactive Patient Education  [employer].

## 2017-08-18 NOTE — H&P (Signed)
@LOGO @   Primary Care Physician:  Caren Macadam, MD Primary Gastroenterologist:  Dr. Gala Romney  Pre-Procedure History & Physical: HPI:  Katelyn [name] is a 77 y.o. female here for for further evaluation of esophageal  Dysphagia and GERD. Symptoms of GERD much improved with pantoprazole.  Dysphagia symptoms persist. Coumadin held and patient was bridged With Lovenox.  Past Medical History:  Diagnosis Date  . Allergy    rhinitis  . Aortic stenosis, severe   . Aortic stenosis, severe   . Arthritis    spine and various joints  . Cataract   . Childhood asthma   . Chronic bronchitis ([place])       . Claudication ([place])   . Coronary artery disease    a. April 2012 which revealed mid RCA occlusion with collaterals, 50% LAD stenosis, 30% circumflex stenosis, and 40% marginal stenosis  . GERD (gastroesophageal reflux disease)   . Heart murmur   . Hyperlipidemia   . Hypertension   . LUMBAR RADICULOPATHY, RIGHT 12/21/2007   Qualifier: Diagnosis of  By: Aline Brochure MD, Dorothyann Peng    . Peripheral vascular disease ([place])   . 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 Seroma, postoperative    Left Groin  . Severe aortic valve stenosis 08/31/2014  . Stroke [facility]) 1982 X 2   "a little bit weaker on the left side since" (08/29/2014)    Past Surgical History:  Procedure Laterality Date  . AORTO-FEMORAL BYPASS GRAFT Bilateral 06/27/10  . BASAL CELL CARCINOMA EXCISION Left    face  . CARDIAC CATHETERIZATION N/A 07/25/2014   Procedure: Right/Left Heart Cath and Coronary Angiography;  Surgeon: Troy Sine, MD;  Location: [place] [facility];  Service: Cardiovascular;  Laterality: N/A;  . CATARACT EXTRACTION, BILATERAL Bilateral 09/29/2016  . COLONOSCOPY  2008   Dr. Laural Golden: normal.   . DILATION AND CURETTAGE OF UTERUS  "2 or 3"  . MULTIPLE TOOTH EXTRACTIONS    . PR VEIN BYPASS  GRAFT,AORTO-FEM-POP  06/12/10  . TEE WITHOUT CARDIOVERSION N/A 08/31/2014   Procedure: TRANSESOPHAGEAL ECHOCARDIOGRAM (TEE);  Surgeon: Rexene Alberts, MD;  Location: [place];  Service: Open Heart Surgery;  Laterality: N/A;  . TRANSCATHETER AORTIC VALVE REPLACEMENT, TRANSFEMORAL N/A 08/31/2014   Procedure: TRANSCATHETER AORTIC VALVE REPLACEMENT, TRANSFEMORAL approach;  Surgeon: Rexene Alberts, MD;  Location: [facility];  Service: Open Heart Surgery;  Laterality: N/A;  . TUBAL LIGATION  1970's  . VAGINAL HYSTERECTOMY  1970's   Partial     Prior to Admission medications   Medication Sig Start Date End Date Taking? Authorizing Provider  amLODipine (NORVASC) 5 MG tablet take 1 tablet by mouth once daily 02/10/17  Yes Troy Sine, MD  enoxaparin (LOVENOX) 100 MG/ML injection Inject 0.85 mLs (85 mg total) into the skin every 12 (twelve) hours. At 10am and 10pm as directed 06/25/17  Yes Mahala Menghini, PA-C  estradiol (ESTRACE) 2 MG tablet TAKE 1 TABLET BY MOUTH ONCE DAILY 08/06/17  Yes Fayrene Helper, MD  ipratropium (ATROVENT) 0.06 % nasal spray Place 1 spray into both nostrils every 6 (six) hours as needed for rhinitis. 05/11/17  Yes Valentina Shaggy, MD  irbesartan (AVAPRO) 150 MG tablet TAKE 1 TABLET BY MOUTH ONCE DAILY 08/06/17  Yes Troy Sine, MD  meloxicam (MOBIC) 15 MG tablet Take 15 mg by mouth daily. 07/02/17  Yes [provider]  metoprolol  succinate (TOPROL-XL) 100 MG 24 hr tablet Take 1 tablet (100 mg total) by mouth daily. 04/14/17  Yes Troy Sine, MD  Multiple Vitamins-Minerals (CENTRUM SILVER PO) Take 1 tablet by mouth daily.   Yes [provider]  nitroGLYCERIN (NITROSTAT) 0.4 MG SL tablet Place 1 tablet (0.4 mg total) under the tongue every 5 (five) minutes as needed for chest pain. 09/12/15  Yes Troy Sine, MD  pantoprazole (PROTONIX) 40 MG tablet Take 1 tablet (40 mg total) by mouth daily before supper. 06/09/17  Yes Mahala Menghini, PA-C  rosuvastatin  (CRESTOR) 20 MG tablet TAKE 1 TABLET BY MOUTH ONCE DAILY 07/02/17  Yes Raylene Everts, MD  Vitamin D, Ergocalciferol, (DRISDOL) 50000 units CAPS capsule TAKE 1 CAPSULE BY MOUTH ONCE A WEEK Patient taking differently: TAKE 1 CAPSULE BY MOUTH ONCE A WEEK ON THURSDAY 08/06/17  Yes Fayrene Helper, MD  warfarin (COUMADIN) 3 MG tablet Take 3 mg by mouth daily at 6 PM.    Yes [provider]  furosemide (LASIX) 20 MG tablet Take 20 mg by mouth daily as needed for fluid.     [provider]  potassium chloride (KLOR-CON) 10 MEQ CR tablet Take 10 mEq by mouth daily as needed (when taking Lasix).     [provider]    Allergies as of 06/17/2017 - Review Complete 06/09/2017  Allergen Reaction Noted  . Adhesive [tape] Rash 09/19/2010  . Latex Rash 09/19/2010  . Tetanus toxoids Rash 08/29/2014    Family History  Problem Relation Age of Onset  . Heart disease Mother 36       died young  . Varicose Veins Mother   . Stroke Father   . Hypertension Father   . Heart disease Father 47       Aneurysm  . Heart attack Father   . Arthritis Brother   . Stroke Brother   . Diabetes Daughter   . Arthritis Daughter   . Allergic rhinitis Neg Hx   . Angioedema Neg Hx   . Asthma Neg Hx   . Atopy Neg Hx   . Eczema Neg Hx   . Immunodeficiency Neg Hx   . Urticaria Neg Hx     Social History   Socioeconomic History  . Marital status: Married    Spouse name: bobby  . Number of children: 1  . Years of education: Not on file  . Highest education level: High school graduate  Occupational History  . 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 Financial resource strain: Not very hard  . Food insecurity:    Worry: Never true    Inability: Never true  . Transportation needs:    Medical: No    Non-medical: No  Tobacco Use  . Smoking status: Former Smoker    Packs/day: 2.00    Years: 56.00    Pack years: 112.00    Types: Cigarettes    Start date:  03/02/1952    Last attempt to quit: 06/01/2010    Years since quitting: 7.2  . Smokeless tobacco: Never Used  . Tobacco comment: HAD WEANED DOWN TO 1/4 PK A DAY BEFORE D/C  Substance and Sexual Activity  . Alcohol use: No    Alcohol/week: 0.0 oz    Frequency: Never    Comment: 08/29/2014 "never drank much; quit in the 1990's"  . Drug use: No  . Sexual activity: Yes    Partners: Male  Lifestyle  .  Physical activity:    Days per week: 0 days    Minutes per session: 0 min  . Stress: Only a little  Relationships  . Social connections:    Talks on phone: More than three times a week    Gets together: More than three times a week    Attends religious service: Patient refused    Active member of club or organization: Patient refused    Attends meetings of clubs or organizations: Never    Relationship status: Married  . Intimate partner violence:    Fear of current or ex partner: No    Emotionally abused: No    Physically abused: No    Forced sexual activity: No  Other Topics Concern  . Not on file  Social History Narrative   Married. Has one daughter from first marriage, second husband has three daughters.    Daughter livers here.      Mortimer Fries has 3 daughters   Patient has one daughter, 3 grands   Likes to crochet   Works in [employer].     Review of Systems: See HPI, otherwise negative ROS  Physical Exam: BP (!) 113/91   Pulse (!) 56   Temp 97.7 F (36.5 C) (Oral)   Resp 12   Ht 5\' 10"  (1.778 m)   Wt 194 lb (88 kg)   SpO2 94%   BMI 27.84 kg/m  General:   Alert,  Well-developed, well-nourished, pleasant and cooperative in NAD Neck:  Supple; no masses or thyromegaly. No significant cervical adenopathy. Lungs:  Clear throughout to auscultation.   No wheezes, crackles, or rhonchi. No acute distress. Heart:  Regular rate and rhythm; Abdomen: Non-distended, normal bowel sounds.  Soft and nontender without appreciable mass or hepatosplenomegaly.  Pulses:  Normal pulses  noted. Extremities:  Without clubbing or edema.  Impression:  77 year old lady with dysphagia in reflux symptoms. Reflux much better now That she is on treatment with panpoprazole.  Recommendations:  I have offered the patient an EGD with esophageal dilation per plan.  The risks, benefits, limitations, alternatives and imponderables have been reviewed with the patient. Questions have been answered. All parties are agreeable.     Notice: This dictation was prepared with Dragon dictation along with smaller phrase technology. Any transcriptional errors that result from this process are unintentional and may not be corrected upon review.

## 2017-08-18 NOTE — Op Note (Signed)
[facility] Patient Name: Katelyn [name] Procedure Date: 08/18/2017 9:47 AM MRN: [id number] Date of Birth: 06-08-1940 Attending MD: Norvel Richards , MD CSN: [id number] Age: 77 Admit Type: Outpatient Procedure:                Upper GI endoscopy Indications:              Dysphagia Providers:                Norvel Richards, MD, Rosina Lowenstein, RN, Aram Candela Referring MD:              Medicines:                Midazolam 2 mg IV, Meperidine 50 mg IV, Ondansetron                            4 mg IV Complications:            No immediate complications. Estimated Blood Loss:     Estimated blood loss was minimal. Procedure:                Pre-Anesthesia Assessment:                           - Prior to the procedure, a History and Physical                            was performed, and patient medications and                            allergies were reviewed. The patient's tolerance of                            previous anesthesia was also reviewed. The risks                            and benefits of the procedure and the sedation                            options and risks were discussed with the patient.                            All questions were answered, and informed consent                            was obtained. Prior Anticoagulants: The patient has                            taken no previous anticoagulant or antiplatelet                            agents. ASA Grade Assessment: III - A patient with  severe systemic disease. After reviewing the risks                            and benefits, the patient was deemed in                            satisfactory condition to undergo the procedure.                           After obtaining informed consent, the endoscope was                            passed under direct vision. Throughout the                            procedure, the patient's blood pressure, pulse, and                             oxygen saturations were monitored continuously. The                            [id number] ([id number]) scope was introduced through the                            mouth, and advanced to the second part of duodenum.                            The upper GI endoscopy was accomplished without                            difficulty. The patient tolerated the procedure                            well. Scope In: 10:10:02 AM Scope Out: 10:16:20 AM Total Procedure Duration: 0 hours 6 minutes 18 seconds  Findings:      The examined esophagus was normal.      A small hiatal hernia was present.      No other significant abnormalities were identified in a careful       examination of the stomach.      The duodenal bulb and second portion of the duodenum were normal. The       scope was withdrawn. Dilation was performed with a Maloney dilator with       mild resistance at 5 Fr. The dilation site was examined following       endoscope reinsertion and showed mild mucosal disruption. Estimated       blood loss was minimal. mucosal disruption was noted through the upper       esophageal sphincter only. Impression:               - Normal esophagus. Dilated. Suspect an occult                            cervical esophageal as the cause of patient's  dysphagia.                           - Small hiatal hernia.                           - Normal duodenal bulb and second portion of the                            duodenum.                           - No specimens collected. Moderate Sedation:      Moderate (conscious) sedation was administered by the endoscopy nurse       and supervised by the endoscopist. The following parameters were       monitored: oxygen saturation, heart rate, blood pressure, respiratory       rate, EKG, adequacy of pulmonary ventilation, and response to care.       Total physician intraservice time was 14 minutes. Recommendation:            - Patient has a contact number available for                            emergencies. The signs and symptoms of potential                            delayed complications were discussed with the                            patient. Return to normal activities tomorrow.                            Written discharge instructions were provided to the                            patient.                           - Resume previous diet.                           - Continue present medications. Continue                            pantoprazole 40 mg daily. Resume Couadin today.                            Resume Lovenox tomorrow morning and continue until                            INR therapeutic.                           - No repeat upper endoscopy.                           - Return to GI office  in 6 months. Procedure Code(s):        --- Professional ---                           [phone], Esophagogastroduodenoscopy, flexible,                            transoral; diagnostic, including collection of                            specimen(s) by brushing or washing, when performed                            (separate procedure)                           43450, Dilation of esophagus, by unguided sound or                            bougie, single or multiple passes                           G0500, Moderate sedation services provided by the                            same physician or other qualified health care                            professional performing a gastrointestinal                            endoscopic service that sedation supports,                            requiring the presence of an independent trained                            observer to assist in the monitoring of the                            patient's level of consciousness and physiological                            status; initial 15 minutes of intra-service time;                            patient age 14 years or older  (additional time may                            be reported with [phone], as appropriate) Diagnosis Code(s):        --- Professional ---                           K44.9, Diaphragmatic hernia without obstruction or  gangrene                           R13.10, Dysphagia, unspecified CPT copyright 2017 American Medical Association. All rights reserved. The codes documented in this report are preliminary and upon coder review may  be revised to meet current compliance requirements. Cristopher Estimable. Baylynn Shifflett, MD Norvel Richards, MD 08/18/2017 10:28:47 AM This report has been signed electronically. Number of Addenda: 0

## 2017-08-18 NOTE — OR Nursing (Signed)
Dr. Gala Romney notified of patient taking Lovenox last night at 2200. Procedure will have to be done after 1000 this morning. Patient aware.

## 2017-08-20 ENCOUNTER — Encounter: Payer: Self-pay | Admitting: Family Medicine

## 2017-08-23 ENCOUNTER — Encounter (HOSPITAL_COMMUNITY): Payer: Self-pay | Admitting: Internal Medicine

## 2017-08-30 ENCOUNTER — Other Ambulatory Visit: Payer: Self-pay | Admitting: Family Medicine

## 2017-08-30 DIAGNOSIS — Z952 Presence of prosthetic heart valve: Secondary | ICD-10-CM | POA: Diagnosis not present

## 2017-08-30 LAB — PROTIME-INR
INR: 2.3 — AB
PROTHROMBIN TIME: 24.3 s — AB (ref 9.0–11.5)

## 2017-08-31 NOTE — Telephone Encounter (Signed)
Error

## 2017-09-01 ENCOUNTER — Telehealth: Payer: Self-pay | Admitting: Family Medicine

## 2017-09-01 DIAGNOSIS — H35363 Drusen (degenerative) of macula, bilateral: Secondary | ICD-10-CM | POA: Diagnosis not present

## 2017-09-01 NOTE — Telephone Encounter (Signed)
Advised patient of recent lab results with verbal understanding. Patient understands to continue current medication dosing.

## 2017-09-01 NOTE — Telephone Encounter (Signed)
Please advise that INR is 2.3. Continue the current dose of warfarin and recheck INR in 1 month. Gwen Her. Mannie Stabile, MD

## 2017-09-05 ENCOUNTER — Other Ambulatory Visit: Payer: Self-pay | Admitting: Family Medicine

## 2017-09-30 ENCOUNTER — Other Ambulatory Visit: Payer: Self-pay | Admitting: Family Medicine

## 2017-09-30 ENCOUNTER — Ambulatory Visit: Payer: Medicare Other | Admitting: Family Medicine

## 2017-09-30 DIAGNOSIS — Z952 Presence of prosthetic heart valve: Secondary | ICD-10-CM | POA: Diagnosis not present

## 2017-10-01 LAB — PROTIME-INR
INR: 3.1 — ABNORMAL HIGH
Prothrombin Time: 32 s — ABNORMAL HIGH (ref 9.0–11.5)

## 2017-10-13 ENCOUNTER — Other Ambulatory Visit: Payer: Self-pay

## 2017-10-13 ENCOUNTER — Ambulatory Visit (INDEPENDENT_AMBULATORY_CARE_PROVIDER_SITE_OTHER): Payer: Medicare Other | Admitting: Family Medicine

## 2017-10-13 ENCOUNTER — Other Ambulatory Visit: Payer: Self-pay | Admitting: Family Medicine

## 2017-10-13 ENCOUNTER — Encounter: Payer: Self-pay | Admitting: Family Medicine

## 2017-10-13 VITALS — BP 124/52 | HR 61 | Temp 98.0°F | Resp 14 | Ht 69.5 in | Wt 195.0 lb

## 2017-10-13 DIAGNOSIS — Z952 Presence of prosthetic heart valve: Secondary | ICD-10-CM | POA: Diagnosis not present

## 2017-10-13 DIAGNOSIS — L239 Allergic contact dermatitis, unspecified cause: Secondary | ICD-10-CM

## 2017-10-13 DIAGNOSIS — I480 Paroxysmal atrial fibrillation: Secondary | ICD-10-CM | POA: Diagnosis not present

## 2017-10-13 DIAGNOSIS — J3089 Other allergic rhinitis: Secondary | ICD-10-CM

## 2017-10-13 DIAGNOSIS — Z7901 Long term (current) use of anticoagulants: Secondary | ICD-10-CM

## 2017-10-13 LAB — PROTIME-INR
INR: 3.4 — AB
Prothrombin Time: 35.4 s — ABNORMAL HIGH (ref 9.0–11.5)

## 2017-10-13 MED ORDER — LEVOCETIRIZINE DIHYDROCHLORIDE 5 MG PO TABS: 5.0000 mg | ORAL_TABLET | Freq: Every evening | ORAL | 0 refills | Status: DC

## 2017-10-13 MED ORDER — DESOXIMETASONE 0.05 % EX CREA: TOPICAL_CREAM | Freq: Two times a day (BID) | CUTANEOUS | 0 refills | Status: DC

## 2017-10-13 NOTE — Progress Notes (Signed)
Patient ID: Katelyn [name], female    DOB: [birth year], 77 y.o.   MRN: [id number]  Chief Complaint  Patient presents with  . Allergies    follow up    Allergies Adhesive [tape]; Latex; and Tetanus toxoids  Subjective:   Katelyn [name] is a 77 y.o. female who presents to [facility] today.  HPI Here for follow up. Is keeping appointment with cardiology/Dr. Claiborne [name]. Is on coumadin 3mg  a day, no missing doses. Has had aortic valve replacement and blood clots.   Reports that Dr. Ernst [name] has done allergy testing. Reports that had allergy shots years ago and told allergic to everything. Has had dx of seasonal and environmental allergies. Has been using benadryl. No SOB or CP. No sore throat. Energy is good.   Has upcoming appointment with Dr. Morrison [name] to establish are her PCP. Reports that needed a refill on steroid cream that she has had for four years, so kept her appointment today. Reports that uses the cream prn. Reports that occasionally gets red bump in skin and uses the cream. Does not apply to face or neck. Uses it on the itchy area. Does not have a rash or bump today but wants to get the refill of it in case needs it.    Past Medical History:  Diagnosis Date  . Allergy    rhinitis  . Aortic stenosis, severe   . Aortic stenosis, severe   . Arthritis    spine and various joints  . Cataract   . Childhood asthma   . Chronic bronchitis ([place])       . Claudication ([place])   . Coronary artery disease    a. April 2012 which revealed mid RCA occlusion with collaterals, 50% LAD stenosis, 30% circumflex stenosis, and 40% marginal stenosis  . GERD (gastroesophageal reflux disease)   . Heart murmur   . Hyperlipidemia   . Hypertension   . LUMBAR RADICULOPATHY, RIGHT 12/21/2007   Qualifier: Diagnosis of  By: Katelyn Brochure MD, Katelyn [name]    . Peripheral vascular disease ([place])   . 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 Seroma, postoperative    Left Groin  . Severe aortic valve stenosis 08/31/2014  . Stroke [facility]) 1982 X 2   "a little bit weaker on the left side since" (08/29/2014)    Past Surgical History:  Procedure Laterality Date  . AORTO-FEMORAL BYPASS GRAFT Bilateral 06/27/10  . BASAL CELL CARCINOMA EXCISION Left    face  . CARDIAC CATHETERIZATION N/A 07/25/2014   Procedure: Right/Left Heart Cath and Coronary Angiography;  Surgeon: Katelyn Sine, MD;  Location: [place] [facility];  Service: Cardiovascular;  Laterality: N/A;  . CATARACT EXTRACTION, BILATERAL Bilateral 09/29/2016  . COLONOSCOPY  2008   Dr. Laural [name]: normal.   . DILATION AND CURETTAGE OF UTERUS  "2 or 3"  . ESOPHAGOGASTRODUODENOSCOPY N/A 08/18/2017   Procedure: ESOPHAGOGASTRODUODENOSCOPY (EGD);  Surgeon: Katelyn Dolin, MD;  [facility];  Service: Endoscopy;  Laterality: N/A;  2:00pm  . MALONEY DILATION N/A 08/18/2017   Procedure: Katelyn [name] DILATION;  Surgeon: Katelyn Dolin, MD;  Location: [facility];  Service: Endoscopy;  Laterality: N/A;  . MULTIPLE TOOTH EXTRACTIONS    . PR VEIN BYPASS GRAFT,AORTO-FEM-POP  06/12/10  . TEE WITHOUT CARDIOVERSION N/A 08/31/2014   Procedure: TRANSESOPHAGEAL ECHOCARDIOGRAM (TEE);  Surgeon: Katelyn Alberts, MD;  Location[facility]  OR;  Service: Open Heart Surgery;  Laterality: N/A;  . TRANSCATHETER AORTIC VALVE REPLACEMENT, TRANSFEMORAL N/A 08/31/2014   Procedure: TRANSCATHETER AORTIC VALVE REPLACEMENT, TRANSFEMORAL approach;  Surgeon: Katelyn Alberts, MD;  Location: [place];  Service: Open Heart Surgery;  Laterality: N/A;  . TUBAL LIGATION  1970's  . VAGINAL HYSTERECTOMY  1970's   Partial     Family History  Problem Relation Age of Onset  . Heart disease Mother 80       died young  . Varicose Veins Mother   . Stroke Father   . Hypertension Father   . Heart disease Father 10       Aneurysm  . Heart attack Father   . Arthritis Brother     . Stroke Brother   . Diabetes Daughter   . Arthritis Daughter   . Allergic rhinitis Neg Hx   . Angioedema Neg Hx   . Asthma Neg Hx   . Atopy Neg Hx   . Eczema Neg Hx   . Immunodeficiency Neg Hx   . Urticaria Neg Hx      Social History   Socioeconomic History  . Marital status: Married    Spouse name: Katelyn [name]  . Number of children: 1  . Years of education: Not on file  . Highest education level: High school graduate  Occupational History  . 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 Financial resource strain: Not very hard  . Food insecurity:    Worry: Never true    Inability: Never true  . Transportation needs:    Medical: No    Non-medical: No  Tobacco Use  . Smoking status: Former Smoker    Packs/day: 2.00    Years: 56.00    Pack years: 112.00    Types: Cigarettes    Start date: 03/02/1952    Last attempt to quit: 06/01/2010    Years since quitting: 7.3  . Smokeless tobacco: Never Used  . Tobacco comment: HAD WEANED DOWN TO 1/4 PK A DAY BEFORE D/C  Substance and Sexual Activity  . Alcohol use: No    Alcohol/week: 0.0 standard drinks    Frequency: Never    Comment: 08/29/2014 "never drank much; quit in the 1990's"  . Drug use: No  . Sexual activity: Yes    Partners: Male  Lifestyle  . Physical activity:    Days per week: 0 days    Minutes per session: 0 min  . Stress: Only a little  Relationships  . Social connections:    Talks on phone: More than three times a week    Gets together: More than three times a week    Attends religious service: Patient refused    Active member of club or organization: Patient refused    Attends meetings of clubs or organizations: Never    Relationship status: Married  Other Topics Concern  . Not on file  Social History Narrative   Married. Has one daughter from first marriage, second husband has three daughters.    Daughter livers here.      Katelyn [name] has 3 daughters   Patient has one daughter, 3 grands    Likes to crochet   Works in [employer].    Current Outpatient Medications on File Prior to Visit  Medication Sig Dispense Refill  . amLODipine (NORVASC) 5 MG tablet take 1 tablet by mouth once daily 90 tablet 3  . estradiol (ESTRACE) 2 MG tablet TAKE 1 TABLET  BY MOUTH ONCE DAILY 90 tablet 0  . furosemide (LASIX) 20 MG tablet Take 20 mg by mouth daily as needed for fluid.     Marland Kitchen ipratropium (ATROVENT) 0.06 % nasal spray Place 1 spray into both nostrils every 6 (six) hours as needed for rhinitis. 16 mL 5  . irbesartan (AVAPRO) 150 MG tablet TAKE 1 TABLET BY MOUTH ONCE DAILY 90 tablet 0  . meloxicam (MOBIC) 15 MG tablet Take 15 mg by mouth daily.  11  . metoprolol succinate (TOPROL-XL) 100 MG 24 hr tablet Take 1 tablet (100 mg total) by mouth daily. 90 tablet 3  . Multiple Vitamins-Minerals (CENTRUM SILVER PO) Take 1 tablet by mouth daily.    . nitroGLYCERIN (NITROSTAT) 0.4 MG SL tablet Place 1 tablet (0.4 mg total) under the tongue every 5 (five) minutes as needed for chest pain. 25 tablet 6  . pantoprazole (PROTONIX) 40 MG tablet Take 1 tablet (40 mg total) by mouth daily before supper. 30 tablet 5  . potassium chloride (KLOR-CON) 10 MEQ CR tablet Take 10 mEq by mouth daily as needed (when taking Lasix).     . rosuvastatin (CRESTOR) 20 MG tablet TAKE 1 TABLET BY MOUTH ONCE DAILY 90 tablet 0  . Vitamin D, Ergocalciferol, (DRISDOL) 50000 units CAPS capsule TAKE 1 CAPSULE BY MOUTH ONCE A WEEK (Patient taking differently: TAKE 1 CAPSULE BY MOUTH ONCE A WEEK ON THURSDAY) 12 capsule 0  . warfarin (COUMADIN) 3 MG tablet Take 3 mg by mouth daily at 6 PM.      No current facility-administered medications on file prior to visit.     Review of Systems  Constitutional: Negative for activity change, appetite change, chills, fatigue and unexpected weight change.  HENT: Positive for postnasal drip, rhinorrhea and sneezing. Negative for congestion, ear discharge, ear pain, sinus pressure, sinus pain, sore  throat, tinnitus, trouble swallowing and voice change.   Respiratory: Negative for cough, chest tightness, shortness of breath, wheezing and stridor.   Cardiovascular: Negative for chest pain, palpitations and leg swelling.  Gastrointestinal: Negative for diarrhea, nausea and vomiting.  Psychiatric/Behavioral: Negative for dysphoric mood. The patient is not nervous/anxious.      Objective:   BP (!) 124/52 (BP Location: Right Arm, Patient Position: Sitting, Cuff Size: Normal)   Pulse 61   Temp 98 F (36.7 C) (Temporal)   Resp 14   Ht 5' 9.5" (1.765 m)   Wt 195 lb 0.6 oz (88.5 kg)   SpO2 96% Comment: room air  BMI 28.39 kg/m   Physical Exam  Constitutional: She is oriented to person, place, and time. She appears well-developed and well-nourished. No distress.  HENT:  Head: Normocephalic and atraumatic.  Mouth/Throat: Uvula is midline and mucous membranes are normal. Mucous membranes are not pale. No oral lesions. No uvula swelling. Posterior oropharyngeal erythema present. No oropharyngeal exudate or posterior oropharyngeal edema. No tonsillar exudate.  Pharyngeal cobblestoning and drainage present.   Eyes: Pupils are equal, round, and reactive to light. No scleral icterus.  Neck: Normal range of motion. Neck supple. No thyromegaly present.  Cardiovascular: Normal rate, regular rhythm and normal heart sounds.  Pulmonary/Chest: Effort normal and breath sounds normal. No respiratory distress.  Neurological: She is alert and oriented to person, place, and time. No cranial nerve deficit.  Skin: Skin is warm and dry.  Psychiatric: She has a normal mood and affect. Her behavior is normal. Judgment and thought content normal.  Nursing note and vitals reviewed.    Assessment and  Plan  1. Environmental and seasonal allergies Follow up on with Dr. Ernst [name]. Allergy avoidance.  Patient counseled in detail regarding the risks of medication. Told to call or return to clinic if develop any  worrisome signs or symptoms. Patient voiced understanding.   - levocetirizine (XYZAL) 5 MG tablet; Take 1 tablet (5 mg total) by mouth every evening.  Dispense: 30 tablet; Refill: 0  2. Allergic contact dermatitis, unspecified trigger Patient counseled in detail regarding the risks of medication. Told to call or return to clinic if develop any worrisome signs or symptoms. Patient voiced understanding.  Do not apply to face or neck. Discussed risks of cream and possible skin pigmentation issues, etc.  - desoximetasone (TOPICORT) 0.05 % cream; Apply topically 2 (two) times daily.  Dispense: 30 g; Refill: 0  Getting INR checked today and will adjust dose as needed.  Keep OV with Dr. Morrison [name] for next week.  Keep scheduled appointments with cardiology.  No follow-ups on file. Caren Macadam, MD 10/13/2017

## 2017-10-14 ENCOUNTER — Telehealth: Payer: Self-pay | Admitting: Family Medicine

## 2017-10-14 DIAGNOSIS — I480 Paroxysmal atrial fibrillation: Secondary | ICD-10-CM

## 2017-10-14 MED ORDER — WARFARIN SODIUM 2.5 MG PO TABS: 2.5000 mg | ORAL_TABLET | Freq: Every day | ORAL | 0 refills | Status: DC

## 2017-10-14 NOTE — Telephone Encounter (Signed)
Called pt INR-3.4 continue to go up and with Dr. Mannie Stabile instructions

## 2017-10-14 NOTE — Telephone Encounter (Signed)
Please call patient advised that her INR was 3.4.  Is continued to go up.  She has previously been taking 3 mg of Coumadin p.o. daily.  I would like for her to decrease her dose to Coumadin 2.5 mg p.o. daily.  I have called this medication into her [employer].  She will need to return to the lab for a recheck of her Coumadin and 10 to 14 days.  However, if she is already going to be following up with her new primary care physician they can recheck the INR at that visit.  However, please advise that she just needs to make sure that it is rechecked and I would recommend her changing to this new dose of medication.  Please let me know if she has any questions.

## 2017-10-14 NOTE — Telephone Encounter (Signed)
Katelyn [name]- see msg

## 2017-10-19 DIAGNOSIS — Z23 Encounter for immunization: Secondary | ICD-10-CM | POA: Diagnosis not present

## 2017-10-19 DIAGNOSIS — R5383 Other fatigue: Secondary | ICD-10-CM | POA: Diagnosis not present

## 2017-10-19 DIAGNOSIS — K219 Gastro-esophageal reflux disease without esophagitis: Secondary | ICD-10-CM | POA: Diagnosis not present

## 2017-10-19 DIAGNOSIS — E785 Hyperlipidemia, unspecified: Secondary | ICD-10-CM | POA: Diagnosis not present

## 2017-10-19 DIAGNOSIS — E559 Vitamin D deficiency, unspecified: Secondary | ICD-10-CM | POA: Diagnosis not present

## 2017-10-19 DIAGNOSIS — I10 Essential (primary) hypertension: Secondary | ICD-10-CM | POA: Diagnosis not present

## 2017-11-03 DIAGNOSIS — Z5181 Encounter for therapeutic drug level monitoring: Secondary | ICD-10-CM | POA: Diagnosis not present

## 2017-11-04 ENCOUNTER — Telehealth: Payer: Self-pay | Admitting: Family Medicine

## 2017-11-04 NOTE — Telephone Encounter (Signed)
Estradiol 2 mg a 90 day supply was signed by me 10/06/2017  I have left a note on your desk  from [facility] about medication concerns  I will not be singing anymore scripts for her and have left the correspondence from [facility] care. I am uncertain if you were regularly prescribing this for her , so am asking that you follow through as she is your patient

## 2017-11-05 LAB — PROTIME-INR

## 2017-11-12 DIAGNOSIS — Z7901 Long term (current) use of anticoagulants: Secondary | ICD-10-CM | POA: Diagnosis not present

## 2017-11-18 ENCOUNTER — Ambulatory Visit (INDEPENDENT_AMBULATORY_CARE_PROVIDER_SITE_OTHER): Payer: Medicare Other | Admitting: Physician Assistant

## 2017-11-18 ENCOUNTER — Encounter: Payer: Self-pay | Admitting: Physician Assistant

## 2017-11-18 VITALS — BP 148/93 | HR 87 | Ht 69.0 in | Wt 197.0 lb

## 2017-11-18 DIAGNOSIS — Z952 Presence of prosthetic heart valve: Secondary | ICD-10-CM | POA: Diagnosis not present

## 2017-11-18 DIAGNOSIS — I10 Essential (primary) hypertension: Secondary | ICD-10-CM

## 2017-11-18 DIAGNOSIS — I480 Paroxysmal atrial fibrillation: Secondary | ICD-10-CM | POA: Diagnosis not present

## 2017-11-18 DIAGNOSIS — I5032 Chronic diastolic (congestive) heart failure: Secondary | ICD-10-CM | POA: Diagnosis not present

## 2017-11-18 DIAGNOSIS — I4891 Unspecified atrial fibrillation: Secondary | ICD-10-CM

## 2017-11-18 DIAGNOSIS — I2510 Atherosclerotic heart disease of native coronary artery without angina pectoris: Secondary | ICD-10-CM

## 2017-11-18 MED ORDER — NITROGLYCERIN 0.4 MG SL SUBL: 0.4000 mg | SUBLINGUAL_TABLET | SUBLINGUAL | 6 refills | Status: DC | PRN

## 2017-11-18 NOTE — Progress Notes (Signed)
Cardiology Office Note:    Date:  11/18/2017   ID:  Katelyn [name], DOB 10/21/1940, MRN [id number]  PCP:  Caren Macadam, MD  Cardiologist:  Shelva Majestic, MD   Referring MD: Caren Macadam, MD   Chief Complaint  Patient presents with  . Follow-up    6 month follow up    History of Present Illness:    Katelyn [name] is a 77 y.o. female with a hx of CAD, peripheral vascular disease, atrial fibrillation anticoagulated with warfarin, severe aortic stenosis status post TAVR, chronic diastolic heart failure with grade 2 diastolic dysfunction, hypertension, hyperlipidemia.  Last heart catheterization 07/25/2014 with chronically occluded RCA with left-to-right collaterals and residual nonobstructive disease.  She underwent TAVR on 08/31/2014.  She developed postoperative atrial fibrillation and was treated with amiodarone.  She was in sinus rhythm on follow-up in clinic.  She was last seen in clinic by Dr. Claiborne Billings on 05/21/2017.  At that time he had increased her metoprolol to 100 mg daily.  She was overall doing well at that time.    She returns today for six-month follow-up.  She is here alone.  Overall she is doing quite well.  Her pressure at home is generally well controlled.  Pressure is elevated here because she rushed in from the parking lot.  Pressure had normalized on recheck.  She states that she battles lower extremity swelling.  She is taking 20 mg of Lasix as needed, but is taking it about 3 times per week.  She is unable to take Lasix daily because her husband has many doctors appointments.  I encouraged her to wear compression socks on days that she is unable to take Lasix.  On days that she does take Lasix, her lower extremity swelling is resolved.   Past Medical History:  Diagnosis Date  . Allergy    rhinitis  . Aortic stenosis, severe   . Aortic stenosis, severe   . Arthritis    spine and various joints  . Cataract   . Childhood asthma   . Chronic bronchitis ([place])       .  Claudication ([place])   . Coronary artery disease    a. April 2012 which revealed mid RCA occlusion with collaterals, 50% LAD stenosis, 30% circumflex stenosis, and 40% marginal stenosis  . GERD (gastroesophageal reflux disease)   . Heart murmur   . Hyperlipidemia   . Hypertension   . LUMBAR RADICULOPATHY, RIGHT 12/21/2007   Qualifier: Diagnosis of  By: Aline Brochure MD, Dorothyann Peng    . Peripheral vascular disease ([place])   . 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 Seroma, postoperative    Left Groin  . Severe aortic valve stenosis 08/31/2014  . Stroke [facility]) 1982 X 2   "a little bit weaker on the left side since" (08/29/2014)    Past Surgical History:  Procedure Laterality Date  . AORTO-FEMORAL BYPASS GRAFT Bilateral 06/27/10  . BASAL CELL CARCINOMA EXCISION Left    face  . CARDIAC CATHETERIZATION N/A 07/25/2014   Procedure: Right/Left Heart Cath and Coronary Angiography;  Surgeon: Troy Sine, MD;  Location: [place] [facility];  Service: Cardiovascular;  Laterality: N/A;  . CATARACT EXTRACTION, BILATERAL Bilateral 09/29/2016  . COLONOSCOPY  2008   Dr. Laural Golden: normal.   . DILATION AND CURETTAGE OF UTERUS  "2 or 3"  . ESOPHAGOGASTRODUODENOSCOPY N/A 08/18/2017   Procedure: ESOPHAGOGASTRODUODENOSCOPY (EGD);  Surgeon: Daneil Dolin, MD;  [facility];  Service: Endoscopy;  Laterality: N/A;  2:00pm  . MALONEY DILATION N/A 08/18/2017   Procedure: Venia Minks DILATION;  Surgeon: Daneil Dolin, MD;  [facility];  Service: Endoscopy;  Laterality: N/A;  . MULTIPLE TOOTH EXTRACTIONS    . PR VEIN BYPASS GRAFT,AORTO-FEM-POP  06/12/10  . TEE WITHOUT CARDIOVERSION N/A 08/31/2014   Procedure: TRANSESOPHAGEAL ECHOCARDIOGRAM (TEE);  Surgeon: Rexene Alberts, MD;  Location: [place];  Service: Open Heart Surgery;  Laterality: N/A;  . TRANSCATHETER AORTIC VALVE REPLACEMENT,  TRANSFEMORAL N/A 08/31/2014   Procedure: TRANSCATHETER AORTIC VALVE REPLACEMENT, TRANSFEMORAL approach;  Surgeon: Rexene Alberts, MD;  Location: [place];  Service: Open Heart Surgery;  Laterality: N/A;  . TUBAL LIGATION  1970's  . VAGINAL HYSTERECTOMY  1970's   Partial     Current Medications: Current Meds  Medication Sig  . amLODipine (NORVASC) 5 MG tablet take 1 tablet by mouth once daily  . desoximetasone (TOPICORT) 0.05 % cream Apply topically 2 (two) times daily.  Marland Kitchen estradiol (ESTRACE) 2 MG tablet TAKE 1 TABLET BY MOUTH ONCE DAILY  . furosemide (LASIX) 20 MG tablet Take 20 mg by mouth daily as needed for fluid.   Marland Kitchen ipratropium (ATROVENT) 0.06 % nasal spray Place 1 spray into both nostrils every 6 (six) hours as needed for rhinitis.  Marland Kitchen irbesartan (AVAPRO) 150 MG tablet TAKE 1 TABLET BY MOUTH ONCE DAILY  . levocetirizine (XYZAL) 5 MG tablet Take 1 tablet (5 mg total) by mouth every evening.  . meloxicam (MOBIC) 15 MG tablet Take 15 mg by mouth daily.  . metoprolol succinate (TOPROL-XL) 100 MG 24 hr tablet Take 1 tablet (100 mg total) by mouth daily.  . Multiple Vitamins-Minerals (CENTRUM SILVER PO) Take 1 tablet by mouth daily.  . nitroGLYCERIN (NITROSTAT) 0.4 MG SL tablet Place 1 tablet (0.4 mg total) under the tongue every 5 (five) minutes as needed for chest pain.  . pantoprazole (PROTONIX) 40 MG tablet Take 1 tablet (40 mg total) by mouth daily before supper.  . potassium chloride (KLOR-CON) 10 MEQ CR tablet Take 10 mEq by mouth daily as needed (when taking Lasix).   . rosuvastatin (CRESTOR) 20 MG tablet TAKE 1 TABLET BY MOUTH ONCE DAILY  . Vitamin D, Ergocalciferol, (DRISDOL) 50000 units CAPS capsule TAKE 1 CAPSULE BY MOUTH ONCE A WEEK (Patient taking differently: TAKE 1 CAPSULE BY MOUTH ONCE A WEEK ON THURSDAY)  . warfarin (COUMADIN) 3 MG tablet Take 3 mg by mouth daily at 6 PM.   . [DISCONTINUED] nitroGLYCERIN (NITROSTAT) 0.4 MG SL tablet Place 1 tablet (0.4 mg total) under the  tongue every 5 (five) minutes as needed for chest pain.     Allergies:   Adhesive [tape]; Latex; and Tetanus toxoids   Social History   Socioeconomic History  . Marital status: Married    Spouse name: bobby  . Number of children: 1  . Years of education: Not on file  . Highest education level: High school graduate  Occupational History  . 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 Financial resource strain: Not very hard  . Food insecurity:    Worry: Never true    Inability: Never true  . Transportation needs:    Medical: No    Non-medical: No  Tobacco Use  . Smoking status: Former Smoker    Packs/day: 2.00    Years: 56.00    Pack years: 112.00  Types: Cigarettes    Start date: 03/02/1952    Last attempt to quit: 06/01/2010    Years since quitting: 7.4  . Smokeless tobacco: Never Used  . Tobacco comment: HAD WEANED DOWN TO 1/4 PK A DAY BEFORE D/C  Substance and Sexual Activity  . Alcohol use: No    Alcohol/week: 0.0 standard drinks    Frequency: Never    Comment: 08/29/2014 "never drank much; quit in the 1990's"  . Drug use: No  . Sexual activity: Yes    Partners: Male  Lifestyle  . Physical activity:    Days per week: 0 days    Minutes per session: 0 min  . Stress: Only a little  Relationships  . Social connections:    Talks on phone: More than three times a week    Gets together: More than three times a week    Attends religious service: Patient refused    Active member of club or organization: Patient refused    Attends meetings of clubs or organizations: Never    Relationship status: Married  Other Topics Concern  . Not on file  Social History Narrative   Married. Has one daughter from first marriage, second husband has three daughters.    Daughter livers here.      Mortimer Fries has 3 daughters   Patient has one daughter, 3 grands   Likes to crochet   Works in [employer].      Family History: The patient's family history includes  Arthritis in her brother and daughter; Diabetes in her daughter; Heart attack in her father; Heart disease (age of onset: 39) in her mother; Heart disease (age of onset: 62) in her father; Hypertension in her father; Stroke in her brother and father; Varicose Veins in her mother. There is no history of Allergic rhinitis, Angioedema, Asthma, Atopy, Eczema, Immunodeficiency, or Urticaria.  ROS:   Please see the history of present illness.     All other systems reviewed and are negative.  EKGs/Labs/Other Studies Reviewed:    The following studies were reviewed today:  Echo 04/23/17: Study Conclusions - Left ventricle: The cavity size was normal. There was mild   concentric hypertrophy. Systolic function was normal. The   estimated ejection fraction was in the range of 60% to 65%. Wall   motion was normal; there were no regional wall motion   abnormalities. Features are consistent with a pseudonormal left   ventricular filling pattern, with concomitant abnormal relaxation   and increased filling pressure (grade 2 diastolic dysfunction).   Doppler parameters are consistent with high ventricular filling   pressure. - Aortic valve: A TAVR bioprosthesis was present and functioning   properly. - Mitral valve: Mildly calcified annulus. Transvalvular velocity   was within the normal range. There was no evidence for stenosis.   There was trivial regurgitation. - Left atrium: The atrium was severely dilated. - Right ventricle: The cavity size was normal. Wall thickness was   normal. Systolic function was normal. - Atrial septum: No defect or patent foramen ovale was identified   by color flow Doppler. - Tricuspid valve: There was mild regurgitation. - Pulmonary arteries: Systolic pressure was within the normal   range. PA peak pressure: 34 mm Hg (S).  Impressions: - TAVR aortic valve prosthesis gradients unchanged from 08/2016.  Left/right heart cath 07/25/14:  Mid RCA lesion, 100%  stenosed.  Ost 1st Diag lesion, 70% stenosed.  Ost Cx lesion, 50% stenosed.  Mid Cx lesion, 40% stenosed.  Colon Flattery  RCA lesion, 60% stenosed.   Normal LV function with an ejection fraction at a proximally 55% but with evidence for small focal region of mid to basal inferior            hypocontractility. 2+ angiographic mitral regurgitation with mean palmar E wedge pressure at 19 and V wave at 31 mmHg. Severe aortic valve stenosis with a peak to peak gradient of 46 mmHg, and a mean gradient of 39.5 mmHg.  Calculated aortic valve    area 0.64 cm. 1 - 2+ angiographic aortic regurgitation. Significant native coronary obstructive disease with evidence for coronary calcification and 70% proximal first diagonal stenosis; 50% proximal circumflex stenosis before the first marginal branch with 40% AV groove circumflex stenosis; 60% ostial narrowing of the RCA with 100% mid RCA occlusion after a marginal branch with bridging collaterals supplying the mid RCA extending from this marginal branch as well as left-to-right collaterals from the distal circumflex and LAD to the distal RCA.  RECOMMENDATION: Surgical consultation will be obtained for aortic valve replacement/CABG revascularization surgery.  EKG:  EKG is not ordered today.    Recent Labs: 04/13/2017: ALT 12; BUN 20; Creat 1.01; Hemoglobin 15.0; Platelets 125; Potassium 4.8; Sodium 138; TSH 3.33  Recent Lipid Panel    Component Value Date/Time   CHOL 124 04/13/2017 1439   CHOL 118 12/25/2015 0909   TRIG 214 (H) 04/13/2017 1439   HDL 46 (L) 04/13/2017 1439   HDL 45 12/25/2015 0909   CHOLHDL 2.7 04/13/2017 1439   VLDL 40 (H) 01/16/2015 1028   LDLCALC 50 04/13/2017 1439    Physical Exam:    VS:  BP (!) 148/93   Pulse 87   Ht 5\' 9"  (1.753 m)   Wt 197 lb (89.4 kg)   SpO2 95%   BMI 29.09 kg/m     Wt Readings from Last 3 Encounters:  11/18/17 197 lb (89.4 kg)  10/13/17 195 lb 0.6 oz (88.5 kg)  08/18/17 194 lb (88 kg)     GEN:  Well nourished, well developed in no acute distress HEENT: Normal NECK: No JVD; No carotid bruits CARDIAC: RRR, + murmur RESPIRATORY:  Clear to auscultation without rales, wheezing or rhonchi  ABDOMEN: Soft, non-tender, non-distended MUSCULOSKELETAL:  Trace edema; No deformity  SKIN: Warm and dry NEUROLOGIC:  Alert and oriented x 3 PSYCHIATRIC:  Normal affect   ASSESSMENT:    1. Atrial fibrillation with RVR ([place])   2. CAD in native artery   3. Essential hypertension   4. Paroxysmal atrial fibrillation (HCC)   5. S/P TAVR (transcatheter aortic valve replacement)   6. Chronic diastolic heart failure (H[facility])    PLAN:    In order of problems listed above:  Paroxysmal atrial fibrillation with RVR (HCC) Heart rate and rhythm regular today.  She is anticoagulated with Coumadin in the setting of her TAVR.  Her PCP is following her INR.  She is doing well on the increased dose of Toprol.  I will make no medication changes today.  CAD in native artery No complaints of anginal symptoms.  Essential hypertension Pressure was elevated on arrival but normalized while in the office.  I will make no medication changes.  S/P TAVR (transcatheter aortic valve replacement) She denies shortness of breath, and orthopnea.  Chronic diastolic heart failure Last echo with grade 2 diastolic dysfunction.  She takes Lasix approximately 3 times per week.  This resolved her lower extremity swelling.  She is unable to take Lasix daily because her  husband has many doctors appointments.  I encouraged her to wear compression socks on the days that she is unable to take Lasix.  Overall, she is doing quite well.   I would like to see her back in 6 months with Dr. Claiborne Billings.   Medication Adjustments/Labs and Tests Ordered: Current medicines are reviewed at length with the patient today.  Concerns regarding medicines are outlined above.  No orders of the defined types were placed in this encounter.  Meds ordered  this encounter  Medications  . nitroGLYCERIN (NITROSTAT) 0.4 MG SL tablet    Sig: Place 1 tablet (0.4 mg total) under the tongue every 5 (five) minutes as needed for chest pain.    Dispense:  25 tablet    Refill:  6    Signed, Ledora Bottcher, [place]  11/18/2017 2:36 PM    [facility]

## 2017-11-18 NOTE — Patient Instructions (Addendum)
Medication Instructions:  Your physician recommends that you continue on your current medications as directed. Please refer to the Current Medication list given to you today.  If you need a refill on your cardiac medications before your next appointment, please call your pharmacy.  Labwork: None   Testing/Procedures: None   Follow-Up: Your physician wants you to follow-up in: 6 months with Dr Claiborne Billings. You will receive a reminder letter in the mail two months in advance. If you don't receive a letter, please call our office to schedule the follow-up appointment.  Any Other Special Instructions Will Be Listed Below (If Applicable).

## 2017-11-29 DIAGNOSIS — J440 Chronic obstructive pulmonary disease with acute lower respiratory infection: Secondary | ICD-10-CM | POA: Diagnosis not present

## 2017-12-01 DIAGNOSIS — Z7901 Long term (current) use of anticoagulants: Secondary | ICD-10-CM | POA: Diagnosis not present

## 2017-12-15 DIAGNOSIS — Z23 Encounter for immunization: Secondary | ICD-10-CM | POA: Diagnosis not present

## 2017-12-16 ENCOUNTER — Other Ambulatory Visit: Payer: Self-pay | Admitting: Gastroenterology

## 2017-12-27 DIAGNOSIS — L03119 Cellulitis of unspecified part of limb: Secondary | ICD-10-CM | POA: Diagnosis not present

## 2018-01-06 DIAGNOSIS — Z7901 Long term (current) use of anticoagulants: Secondary | ICD-10-CM | POA: Diagnosis not present

## 2018-01-07 DIAGNOSIS — T63481A Toxic effect of venom of other arthropod, accidental (unintentional), initial encounter: Secondary | ICD-10-CM | POA: Diagnosis not present

## 2018-01-07 DIAGNOSIS — Z7901 Long term (current) use of anticoagulants: Secondary | ICD-10-CM | POA: Diagnosis not present

## 2018-01-07 DIAGNOSIS — E785 Hyperlipidemia, unspecified: Secondary | ICD-10-CM | POA: Diagnosis not present

## 2018-01-07 DIAGNOSIS — Z8673 Personal history of transient ischemic attack (TIA), and cerebral infarction without residual deficits: Secondary | ICD-10-CM | POA: Diagnosis not present

## 2018-01-07 DIAGNOSIS — T63481S Toxic effect of venom of other arthropod, accidental (unintentional), sequela: Secondary | ICD-10-CM | POA: Diagnosis not present

## 2018-01-07 DIAGNOSIS — Z79899 Other long term (current) drug therapy: Secondary | ICD-10-CM | POA: Diagnosis not present

## 2018-01-07 DIAGNOSIS — I10 Essential (primary) hypertension: Secondary | ICD-10-CM | POA: Diagnosis not present

## 2018-01-07 DIAGNOSIS — L03119 Cellulitis of unspecified part of limb: Secondary | ICD-10-CM | POA: Diagnosis not present

## 2018-01-07 DIAGNOSIS — J449 Chronic obstructive pulmonary disease, unspecified: Secondary | ICD-10-CM | POA: Diagnosis not present

## 2018-01-11 ENCOUNTER — Other Ambulatory Visit: Payer: Self-pay | Admitting: Cardiovascular Disease

## 2018-01-11 ENCOUNTER — Other Ambulatory Visit: Payer: Self-pay | Admitting: Family Medicine

## 2018-01-11 DIAGNOSIS — Z79890 Hormone replacement therapy (postmenopausal): Secondary | ICD-10-CM

## 2018-01-12 DIAGNOSIS — R5383 Other fatigue: Secondary | ICD-10-CM | POA: Diagnosis not present

## 2018-01-12 DIAGNOSIS — E559 Vitamin D deficiency, unspecified: Secondary | ICD-10-CM | POA: Diagnosis not present

## 2018-01-12 DIAGNOSIS — E785 Hyperlipidemia, unspecified: Secondary | ICD-10-CM | POA: Diagnosis not present

## 2018-01-12 DIAGNOSIS — I10 Essential (primary) hypertension: Secondary | ICD-10-CM | POA: Diagnosis not present

## 2018-02-03 DIAGNOSIS — Z7901 Long term (current) use of anticoagulants: Secondary | ICD-10-CM | POA: Diagnosis not present

## 2018-02-04 ENCOUNTER — Other Ambulatory Visit: Payer: Self-pay | Admitting: Cardiovascular Disease

## 2018-02-04 ENCOUNTER — Other Ambulatory Visit: Payer: Self-pay | Admitting: Family Medicine

## 2018-02-04 DIAGNOSIS — Z79890 Hormone replacement therapy (postmenopausal): Secondary | ICD-10-CM

## 2018-02-09 DIAGNOSIS — Z6828 Body mass index (BMI) 28.0-28.9, adult: Secondary | ICD-10-CM | POA: Diagnosis not present

## 2018-02-09 DIAGNOSIS — E663 Overweight: Secondary | ICD-10-CM | POA: Diagnosis not present

## 2018-02-17 ENCOUNTER — Ambulatory Visit (INDEPENDENT_AMBULATORY_CARE_PROVIDER_SITE_OTHER): Payer: Medicare Other | Admitting: Gastroenterology

## 2018-02-17 ENCOUNTER — Encounter: Payer: Self-pay | Admitting: Gastroenterology

## 2018-02-17 ENCOUNTER — Other Ambulatory Visit (HOSPITAL_COMMUNITY): Payer: Self-pay | Admitting: Internal Medicine

## 2018-02-17 VITALS — BP 107/65 | HR 84 | Temp 97.7°F | Ht 70.0 in | Wt 198.8 lb

## 2018-02-17 DIAGNOSIS — Z1231 Encounter for screening mammogram for malignant neoplasm of breast: Secondary | ICD-10-CM

## 2018-02-17 DIAGNOSIS — R1310 Dysphagia, unspecified: Secondary | ICD-10-CM | POA: Diagnosis not present

## 2018-02-17 DIAGNOSIS — K219 Gastro-esophageal reflux disease without esophagitis: Secondary | ICD-10-CM | POA: Diagnosis not present

## 2018-02-17 DIAGNOSIS — I6523 Occlusion and stenosis of bilateral carotid arteries: Secondary | ICD-10-CM | POA: Diagnosis not present

## 2018-02-17 DIAGNOSIS — R1319 Other dysphagia: Secondary | ICD-10-CM

## 2018-02-17 NOTE — Progress Notes (Signed)
Primary Care Physician: Doree Albee, MD  Primary Gastroenterologist:  Garfield Cornea, MD   Chief Complaint  Patient presents with  . Gastroesophageal Reflux    doing ok    HPI: Katelyn [name] is a 77 y.o. female here for follow up. She had EGD with esophageal dilation in 07/2017 for dysphagia. Esophagus appeared normal. Dilation was performed and she was suspected to have occult cervical esophageal stricture.   Dysphagia has resolved.  She was having nighttime reflux but since starting pantoprazole before her evening meal over 6 months ago she has been doing very well.  Denies any breakthrough symptoms.  No abdominal pain.  No melena or rectal bleeding.  No constipation or diarrhea.  Declines future colonoscopies.   Current Outpatient Medications  Medication Sig Dispense Refill  . amLODipine (NORVASC) 5 MG tablet take 1 tablet by mouth once daily 90 tablet 3  . desoximetasone (TOPICORT) 0.05 % cream Apply topically 2 (two) times daily. 30 g 0  . estradiol (ESTRACE) 2 MG tablet TAKE 1 TABLET BY MOUTH ONCE DAILY 90 tablet 0  . furosemide (LASIX) 20 MG tablet Take 20 mg by mouth daily as needed for fluid.     Marland Kitchen ipratropium (ATROVENT) 0.06 % nasal spray Place 1 spray into both nostrils every 6 (six) hours as needed for rhinitis. 16 mL 5  . irbesartan (AVAPRO) 150 MG tablet TAKE 1 TABLET BY MOUTH ONCE DAILY 90 tablet 1  . meloxicam (MOBIC) 15 MG tablet Take 15 mg by mouth daily.  11  . metoprolol succinate (TOPROL-XL) 100 MG 24 hr tablet TAKE 1 TABLET BY MOUTH ONCE DAILY 90 tablet 1  . Multiple Vitamins-Minerals (CENTRUM SILVER PO) Take 1 tablet by mouth daily.    . nitroGLYCERIN (NITROSTAT) 0.4 MG SL tablet Place 1 tablet (0.4 mg total) under the tongue every 5 (five) minutes as needed for chest pain. 25 tablet 6  . pantoprazole (PROTONIX) 40 MG tablet TAKE 1 TABLET BY MOUTH ONCE DAILY BEFORE SUPPER 30 tablet 5  . potassium chloride (KLOR-CON) 10 MEQ CR tablet Take 10 mEq by  mouth daily as needed (when taking Lasix).     . rosuvastatin (CRESTOR) 20 MG tablet TAKE 1 TABLET BY MOUTH ONCE DAILY 90 tablet 0  . Vitamin D, Ergocalciferol, (DRISDOL) 50000 units CAPS capsule TAKE 1 CAPSULE BY MOUTH ONCE A WEEK (Patient taking differently: TAKE 1 CAPSULE BY MOUTH ONCE A WEEK ON THURSDAY) 12 capsule 0  . warfarin (COUMADIN) 3 MG tablet Take 3 mg by mouth daily at 6 PM.      No current facility-administered medications for this visit.     Allergies as of 02/17/2018 - Review Complete 02/17/2018  Allergen Reaction Noted  . Adhesive [tape] Rash 09/19/2010  . Latex Rash 09/19/2010  . Tetanus toxoids Rash 08/29/2014    ROS:  General: Negative for anorexia, weight loss, fever, chills, fatigue, weakness. ENT: Negative for hoarseness, difficulty swallowing , nasal congestion. CV: Negative for chest pain, angina, palpitations, dyspnea on exertion, peripheral edema.  Respiratory: Negative for dyspnea at rest, dyspnea on exertion, cough, sputum, wheezing.  GI: See history of present illness. GU:  Negative for dysuria, hematuria, urinary incontinence, urinary frequency, nocturnal urination.  Endo: Negative for unusual weight change.    Physical Examination:   BP 107/65   Pulse 84   Temp 97.7 F (36.5 C) (Oral)   Ht 5\' 10"  (1.778 m)   Wt 198 lb 12.8 oz (90.2 kg)  BMI 28.52 kg/m   General: Well-nourished, well-developed in no acute distress.  Eyes: No icterus. Mouth: Oropharyngeal mucosa moist and pink , no lesions erythema or exudate. Lungs: Clear to auscultation bilaterally.  Heart: Regular rate and rhythm, no murmurs rubs or gallops.  Abdomen: Bowel sounds are normal, nontender, nondistended, no hepatosplenomegaly or masses, no abdominal bruits or hernia , no rebound or guarding.   Extremities: No lower extremity edema. No clubbing or deformities. Neuro: Alert and oriented x 4   Skin: Warm and dry, no jaundice.   Psych: Alert and cooperative, normal mood and  affect.

## 2018-02-17 NOTE — Assessment & Plan Note (Signed)
Doing very well on pantoprazole 40 mg daily.  Nocturnal symptoms have resolved.  Dysphagia resolved status post dilation as outlined above.  Return to the office in 1 year or sooner if needed.

## 2018-02-17 NOTE — Patient Instructions (Signed)
Continue pantoprazole once daily.   Return to the office in one year or sooner if needed.

## 2018-02-21 NOTE — Progress Notes (Signed)
cc'd to pcp 

## 2018-03-03 ENCOUNTER — Other Ambulatory Visit: Payer: Self-pay | Admitting: Family Medicine

## 2018-03-03 DIAGNOSIS — Z7901 Long term (current) use of anticoagulants: Secondary | ICD-10-CM | POA: Diagnosis not present

## 2018-03-10 ENCOUNTER — Ambulatory Visit (HOSPITAL_COMMUNITY)
Admission: RE | Admit: 2018-03-10 | Discharge: 2018-03-10 | Disposition: A | Discharge status: Home / Self Care | Payer: Medicare Other | Source: Ambulatory Visit | Attending: Internal Medicine | Admitting: Internal Medicine

## 2018-03-10 DIAGNOSIS — Z1231 Encounter for screening mammogram for malignant neoplasm of breast: Secondary | ICD-10-CM | POA: Diagnosis not present

## 2018-03-23 ENCOUNTER — Other Ambulatory Visit: Payer: Self-pay | Admitting: *Deleted

## 2018-03-23 MED ORDER — AMLODIPINE BESYLATE 5 MG PO TABS: 5.0000 mg | ORAL_TABLET | Freq: Every day | ORAL | 1 refills | Status: DC

## 2018-03-28 ENCOUNTER — Other Ambulatory Visit: Payer: Self-pay

## 2018-03-28 DIAGNOSIS — Z8679 Personal history of other diseases of the circulatory system: Secondary | ICD-10-CM

## 2018-03-28 DIAGNOSIS — I7409 Other arterial embolism and thrombosis of abdominal aorta: Secondary | ICD-10-CM

## 2018-03-31 ENCOUNTER — Ambulatory Visit (HOSPITAL_COMMUNITY)
Admission: RE | Admit: 2018-03-31 | Discharge: 2018-03-31 | Disposition: A | Discharge status: Home / Self Care | Payer: Medicare Other | Source: Ambulatory Visit | Attending: Surgery | Admitting: Surgery

## 2018-03-31 ENCOUNTER — Encounter: Payer: Self-pay | Admitting: Family

## 2018-03-31 ENCOUNTER — Ambulatory Visit (INDEPENDENT_AMBULATORY_CARE_PROVIDER_SITE_OTHER): Payer: Medicare Other | Admitting: Physician Assistant

## 2018-03-31 ENCOUNTER — Other Ambulatory Visit: Payer: Self-pay

## 2018-03-31 VITALS — BP 140/90 | HR 101 | Temp 96.7°F | Resp 16 | Ht 70.0 in | Wt 195.0 lb

## 2018-03-31 DIAGNOSIS — I7409 Other arterial embolism and thrombosis of abdominal aorta: Secondary | ICD-10-CM

## 2018-03-31 DIAGNOSIS — Z8679 Personal history of other diseases of the circulatory system: Secondary | ICD-10-CM | POA: Diagnosis not present

## 2018-03-31 DIAGNOSIS — Z7901 Long term (current) use of anticoagulants: Secondary | ICD-10-CM | POA: Diagnosis not present

## 2018-03-31 NOTE — Progress Notes (Signed)
History of Present Illness:  Patient is a 78 y.o. year old female who is status post aortobifemoral bypass graft in April of 2012 by Dr. Trula Slade. This was done for bilateral claudication. Postoperatively, she had issues with swelling.  She has swelling in both lower legs, left more so than right.   She still wears compression stockings.   She had 2 strokes in 1982;Dr. Georgina Peer, her cardiologist, has been monitoring her carotid arteries. She denies any stroke or TIA's since 1982.   The patient currently describes a cramping sensation in the both lower extremity. There is not rest pain.  There is no history of ulcerations on the feet.  Atherosclerotic risk factors and other medical problems include HTN, A fib managed with Coumadin, and Hyperlipidemia managed with Crestor.    Past Medical History:  Diagnosis Date  . Allergy    rhinitis  . Aortic stenosis, severe   . Aortic stenosis, severe   . Arthritis    spine and various joints  . Cataract   . Childhood asthma   . Chronic bronchitis ([place])       . Claudication ([place])   . Coronary artery disease    a. April 2012 which revealed mid RCA occlusion with collaterals, 50% LAD stenosis, 30% circumflex stenosis, and 40% marginal stenosis  . GERD (gastroesophageal reflux disease)   . Heart murmur   . Hyperlipidemia   . Hypertension   . LUMBAR RADICULOPATHY, RIGHT 12/21/2007   Qualifier: Diagnosis of  By: Aline Brochure MD, Dorothyann Peng    . LUMBAR RADICULOPATHY, RIGHT   . Peripheral vascular disease ([place])   . 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 Seroma, postoperative    Left Groin  . Severe aortic valve stenosis 08/31/2014  . Stroke [facility]) 1982 X 2   "a little bit weaker on the left side since" (08/29/2014)    Past Surgical History:  Procedure Laterality Date  . AORTO-FEMORAL BYPASS GRAFT Bilateral 06/27/10  . BASAL CELL  CARCINOMA EXCISION Left    face  . CARDIAC CATHETERIZATION N/A 07/25/2014   Procedure: Right/Left Heart Cath and Coronary Angiography;  Surgeon: Troy Sine, MD;  Location: [facility];  Service: Cardiovascular;  Laterality: N/A;  . CATARACT EXTRACTION, BILATERAL Bilateral 09/29/2016  . COLONOSCOPY  2008   Dr. Laural Golden: normal.   . DILATION AND CURETTAGE OF UTERUS  "2 or 3"  . ESOPHAGOGASTRODUODENOSCOPY N/A 08/18/2017   Procedure: ESOPHAGOGASTRODUODENOSCOPY (EGD);  Surgeon: Daneil Dolin, MD;  [facility];  Service: Endoscopy;  Laterality: N/A;  2:00pm  . MALONEY DILATION N/A 08/18/2017   Procedure: Venia Minks DILATION;  Surgeon: Daneil Dolin, MD;  Location[facility];  Service: Endoscopy;  Laterality: N/A;  . MULTIPLE TOOTH EXTRACTIONS    . PR VEIN BYPASS GRAFT,AORTO-FEM-POP  06/12/10  . TEE WITHOUT CARDIOVERSION N/A 08/31/2014   Procedure: TRANSESOPHAGEAL ECHOCARDIOGRAM (TEE);  Surgeon: Rexene Alberts, MD;  Location: [place];  Service: Open Heart Surgery;  Laterality: N/A;  . TRANSCATHETER AORTIC VALVE REPLACEMENT, TRANSFEMORAL N/A 08/31/2014   Procedure: TRANSCATHETER AORTIC VALVE REPLACEMENT, TRANSFEMORAL approach;  Surgeon: Rexene Alberts, MD;  Location: [place];  Service: Open Heart Surgery;  Laterality: N/A;  . TUBAL LIGATION  1970's  . VAGINAL HYSTERECTOMY  1970's   Partial     ROS:   General:  No weight loss, Fever, chills  HEENT: No recent  headaches, no nasal bleeding, no visual changes, no sore throat  Neurologic: No dizziness, blackouts, seizures. No recent symptoms of stroke or mini- stroke. No recent episodes of slurred speech, or temporary blindness.  Cardiac: No recent episodes of chest pain/pressure, no shortness of breath at rest.  No shortness of breath with exertion.  Denies history of atrial fibrillation or irregular heartbeat  Vascular: No history of rest pain in feet.  No history of claudication.  No history of non-healing ulcer, No history of  DVT   Pulmonary: No home oxygen, no productive cough, no hemoptysis,  No asthma or wheezing  Musculoskeletal:  [x ] Arthritis, [ ]  Low back pain,  [ ]  Joint pain  Hematologic:No history of hypercoagulable state.  No history of easy bleeding.  No history of anemia  Gastrointestinal: No hematochezia or melena,  No gastroesophageal reflux, no trouble swallowing  Urinary: [ ]  chronic Kidney disease, [ ]  on HD - [ ]  MWF or [ ]  TTHS, [ ]  Burning with urination, [ ]  Frequent urination, [ ]  Difficulty urinating;   Skin: No rashes  Psychological: No history of anxiety,  No history of depression  Social History Social History   Tobacco Use  . Smoking status: Former Smoker    Packs/day: 2.00    Years: 56.00    Pack years: 112.00    Types: Cigarettes    Start date: 03/02/1952    Last attempt to quit: 06/01/2010    Years since quitting: 7.8  . Smokeless tobacco: Never Used  . Tobacco comment: HAD WEANED DOWN TO 1/4 PK A DAY BEFORE D/C  Substance Use Topics  . Alcohol use: No    Alcohol/week: 0.0 standard drinks    Frequency: Never    Comment: 08/29/2014 "never drank much; quit in the 1990's"  . Drug use: No    Family History Family History  Problem Relation Age of Onset  . Heart disease Mother 55       died young  . Varicose Veins Mother   . Stroke Father   . Hypertension Father   . Heart disease Father 66       Aneurysm  . Heart attack Father   . Arthritis Brother   . Stroke Brother   . Diabetes Daughter   . Arthritis Daughter   . Allergic rhinitis Neg Hx   . Angioedema Neg Hx   . Asthma Neg Hx   . Atopy Neg Hx   . Eczema Neg Hx   . Immunodeficiency Neg Hx   . Urticaria Neg Hx     Allergies  Allergies  Allergen Reactions  . Adhesive [Tape] Rash  . Latex Rash  . Tetanus Toxoids Rash     Current Outpatient Medications  Medication Sig Dispense Refill  . amLODipine (NORVASC) 5 MG tablet Take 1 tablet (5 mg total) by mouth daily. 90 tablet 1  . desoximetasone  (TOPICORT) 0.05 % cream Apply topically 2 (two) times daily. 30 g 0  . estradiol (ESTRACE) 2 MG tablet TAKE 1 TABLET BY MOUTH ONCE DAILY 90 tablet 0  . furosemide (LASIX) 20 MG tablet Take 20 mg by mouth daily as needed for fluid.     Marland Kitchen ipratropium (ATROVENT) 0.06 % nasal spray Place 1 spray into both nostrils every 6 (six) hours as needed for rhinitis. 16 mL 5  . irbesartan (AVAPRO) 150 MG tablet TAKE 1 TABLET BY MOUTH ONCE DAILY 90 tablet 1  . meloxicam (MOBIC) 15 MG tablet Take 15 mg by mouth  daily.  11  . metoprolol succinate (TOPROL-XL) 100 MG 24 hr tablet TAKE 1 TABLET BY MOUTH ONCE DAILY 90 tablet 1  . Multiple Vitamins-Minerals (CENTRUM SILVER PO) Take 1 tablet by mouth daily.    . nitroGLYCERIN (NITROSTAT) 0.4 MG SL tablet Place 1 tablet (0.4 mg total) under the tongue every 5 (five) minutes as needed for chest pain. 25 tablet 6  . pantoprazole (PROTONIX) 40 MG tablet TAKE 1 TABLET BY MOUTH ONCE DAILY BEFORE SUPPER 30 tablet 5  . potassium chloride (KLOR-CON) 10 MEQ CR tablet Take 10 mEq by mouth daily as needed (when taking Lasix).     . rosuvastatin (CRESTOR) 20 MG tablet TAKE 1 TABLET BY MOUTH ONCE DAILY 90 tablet 0  . Vitamin D, Ergocalciferol, (DRISDOL) 50000 units CAPS capsule TAKE 1 CAPSULE BY MOUTH ONCE A WEEK (Patient taking differently: TAKE 1 CAPSULE BY MOUTH ONCE A WEEK ON THURSDAY) 12 capsule 0  . warfarin (COUMADIN) 3 MG tablet Take 3 mg by mouth daily at 6 PM.      No current facility-administered medications for this visit.     Physical Examination  Vitals:   03/31/18 1437  BP: 140/90  Pulse: (!) 101  Resp: 16  Temp: (!) 96.7 F (35.9 C)  TempSrc: Oral  SpO2: 95%  Weight: 195 lb (88.5 kg)  Height: 5\' 10"  (1.778 m)    Body mass index is 27.98 kg/m.  General:  Alert and oriented, no acute distress HEENT: Normal, normocephalic Neck: No bruit or JVD Pulmonary: Clear to auscultation bilaterally Cardiac: Regular Rate and Rhythm without murmur Abdomen: Soft,  non-tender, non-distended, no mass, no scars Skin: No rash, left lateral ankle with reddish scale skin no open wounds Extremity Pulses:  2+ radial, brachial, femoral,   posterior tibial pulses bilaterally Musculoskeletal:Chronic left > right LE edema  Neurologic: Upper and lower extremity motor 5/5 and symmetric  DATA:  ABI Findings: +---------+------------------+-----+---------+--------+ Right    Rt Pressure (mmHg)IndexWaveform Comment  +---------+------------------+-----+---------+--------+ Brachial 140                                      +---------+------------------+-----+---------+--------+ PTA      173               1.22 triphasic         +---------+------------------+-----+---------+--------+ DP       149               1.05 biphasic          +---------+------------------+-----+---------+--------+ Great Toe119               0.84 Dampened          +---------+------------------+-----+---------+--------+  +---------+------------------+-----+---------+-------+ Left     Lt Pressure (mmHg)IndexWaveform Comment +---------+------------------+-----+---------+-------+ Brachial 142                                     +---------+------------------+-----+---------+-------+ PTA      176               1.24 triphasic        +---------+------------------+-----+---------+-------+ DP       155               1.09 triphasic        +---------+------------------+-----+---------+-------+ Orma Render  0.75 dampened         +---------+------------------+-----+---------+-------+  +-------+-----------+-----------+------------+------------+ ABI/TBIToday's ABIToday's TBIPrevious ABIPrevious TBI +-------+-----------+-----------+------------+------------+ Right  1.22       0.84       1.22        0.69         +-------+-----------+-----------+------------+------------+ Left   1.24       0.75       1.12        0.60          +-------+-----------+-----------+------------+------------+    Bilateral ABIs appear essentially unchanged compared to prior study on 03/29/2017. Bilateral TBIs appear increased compared to prior study on 03/29/2017.   Summary: Right: Resting right ankle-brachial index is within normal range. No evidence of significant right lower extremity arterial disease. The right toe-brachial index is normal. RT great toe pressure = 119 mmHg. PPG tracings appear dampened.  Left: Resting left ankle-brachial index is within normal range. No evidence of significant left lower extremity arterial disease. The left toe-brachial index is normal. LT Great toe pressure = 106 mmHg. PPG tracings appear dampened.    ASSESSMENT:  PAD s/p status post aortobifemoral bypass graft in April of 2012  Chronic edema left LE > right skin changes lateral left ankle.  Dry scaly no openings in the skin.   PLAN:  ABI are accentually unchanged.  She Can ambulate without symptoms of claudication.  She will f/u in 1 year for repeat ABI's and if neccessary we will order venous reflux studies.  If she develops nonhealing ulcers or symptoms of claudication she will call.    She will continue to wear her compression socks daily and elevate her legs when at rest.  Of note her husband is very ill and is being returned home on Hospice secondary to  sever COPD.   Roxy Horseman PA-C [facility] Office: [phone]  MD in cilnic: Oneida Alar

## 2018-04-14 DIAGNOSIS — E785 Hyperlipidemia, unspecified: Secondary | ICD-10-CM | POA: Diagnosis not present

## 2018-04-14 DIAGNOSIS — E663 Overweight: Secondary | ICD-10-CM | POA: Diagnosis not present

## 2018-04-14 DIAGNOSIS — I10 Essential (primary) hypertension: Secondary | ICD-10-CM | POA: Diagnosis not present

## 2018-04-14 DIAGNOSIS — R5383 Other fatigue: Secondary | ICD-10-CM | POA: Diagnosis not present

## 2018-04-28 DIAGNOSIS — Z7901 Long term (current) use of anticoagulants: Secondary | ICD-10-CM | POA: Diagnosis not present

## 2018-05-10 ENCOUNTER — Ambulatory Visit: Payer: Medicare Other | Admitting: Allergy & Immunology

## 2018-05-11 ENCOUNTER — Ambulatory Visit: Payer: Medicare Other | Admitting: Allergy & Immunology

## 2018-05-12 ENCOUNTER — Other Ambulatory Visit: Payer: Self-pay

## 2018-05-12 ENCOUNTER — Encounter: Payer: Self-pay | Admitting: Cardiovascular Disease

## 2018-05-12 ENCOUNTER — Ambulatory Visit: Payer: Medicare Other | Admitting: Cardiovascular Disease

## 2018-05-12 ENCOUNTER — Ambulatory Visit (INDEPENDENT_AMBULATORY_CARE_PROVIDER_SITE_OTHER): Payer: Medicare Other | Admitting: Cardiovascular Disease

## 2018-05-12 VITALS — BP 144/86 | HR 87 | Ht 70.0 in | Wt 189.2 lb

## 2018-05-12 DIAGNOSIS — I2510 Atherosclerotic heart disease of native coronary artery without angina pectoris: Secondary | ICD-10-CM

## 2018-05-12 DIAGNOSIS — I480 Paroxysmal atrial fibrillation: Secondary | ICD-10-CM | POA: Diagnosis not present

## 2018-05-12 DIAGNOSIS — I10 Essential (primary) hypertension: Secondary | ICD-10-CM | POA: Diagnosis not present

## 2018-05-12 DIAGNOSIS — Z7901 Long term (current) use of anticoagulants: Secondary | ICD-10-CM | POA: Diagnosis not present

## 2018-05-12 DIAGNOSIS — Z952 Presence of prosthetic heart valve: Secondary | ICD-10-CM | POA: Diagnosis not present

## 2018-05-12 DIAGNOSIS — I6523 Occlusion and stenosis of bilateral carotid arteries: Secondary | ICD-10-CM | POA: Diagnosis not present

## 2018-05-12 DIAGNOSIS — E785 Hyperlipidemia, unspecified: Secondary | ICD-10-CM | POA: Diagnosis not present

## 2018-05-12 MED ORDER — DILTIAZEM HCL ER COATED BEADS 180 MG PO CP24: 180.0000 mg | ORAL_CAPSULE | Freq: Every day | ORAL | 3 refills | Status: DC

## 2018-05-12 NOTE — Progress Notes (Signed)
Patient ID: Katelyn [name], female   DOB: October 06, 1940, 78 y.o.   MRN: [id number]    PCP: Dr. Newt Minion  HPI: Katelyn [name] is a 78 y.o. female who presents to the office for a 1 year follow-up cardiology evaluation  Katelyn [name] has known CAD and underwent initial cardiac catheterization in  April 2012 which revealed mid RCA occlusion with collaterals, 50% LAD stenosis, 30% circumflex stenosis, and 40% marginal stenosis. 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 Trula Slade. She has a history of hypertension with grade 2 diastolic dysfunction, hyperlipidemia, and has developed moderately severe aortic valve stenosis. An echo Doppler study in May 2013 showed a mean transvalvular aortic gradient of 34 with a maximum of 56. In December 2013, her mean gradient was 32 and peak 56 with a valve area of 0.84. An echo Doppler study on 12/06/2012 continued to show normal systolic function with an ejection fraction of 60-65%. She had grade 1 diastolic dysfunction. Peak gradients were  61 mm and mean gradient was 38 mm.  The aortic valve area 0.8-0.9 cm. She had mild to moderate aortic insufficiency. Had mildly thickened mitral valve leaflets with mild MR. The left atrium was mildly dilated. She had mild TR. Pulmonary pressure was mildly increased at 33 mm.  A follow-up echo Doppler study on 02/12/2014  Showed normal EF at 55-60%. Her aortic valve was severely calcified and stenosis was felt to be severe with a mean gradient of 42, a peak gradient of 64 which had slightly increaed from 38 and 61 mm, respectively.  She began to develop exertional chest discomfort with associated dyspnea and fatigue.  She underwent repeat cardiac catheterization on 07/25/2014 which showed a chronically occluded RCA with left-to-right collaterals, a 70% diagonal stenosis, 50% ostial circumflex  stenosis and 40% AV groove circumflex stenosis.  Her aortic stenosis at further progressed such that she had a mean gradient of 42 mm.  She was felt to be a poor candidate for conventional aortic valve replacement and on 08/31/2014 underwent successful  TAVR with placement of a 26 mm Edward Sapien 3 valve from the right femoral approach by Drs. Roxy Manns and [place].  She developed postoperative atrial fibrillation for which she was transiently treated with amiodarone and when seen for office follow-up she was back in sinus rhythm.  She has noticed improvement in energy.  She is unaware of recurrent atrial fibrillation.  She denies recurrent chest tightness.  She at times is fatigued.  A subsequent echo on 10/01/2014 showed an EF of 60-65% with a mean aortic gradient of 16 mm and peak gradient 26 with trivial aortic insufficiency.    A follow-up echo Doppler study on 09/09/2015 showed an EF of 60-65% and grade 2 diastolic dysfunction.  There were no wall motion abnormalities.  Her 26 mm Edward's Sapien 3 valve was well-seated from her TAVR procedure.  The mean valve gradient was 16 mm.  There was no evidence for perivalvular leak.  There was mitral annular calcification with mild MR, mild LA dilatation and mild dilation of her right ventricle.  There is mild coronary hypertension with a PA pressure 33 mm.    When I  saw her in October 2017, her dose of valsartan had been reduced prior to the office visit by Dr. Karie Kirks.  At that time, she was having more frequent palpitations and had trigeminy on her ECG.  With her being on reduce valsartan.  I recommended further titration of her beta blocker and increased her Toprol to 50 mg daily.    When I saw her in December 2017 her blood pressure was significantly elevated.  Her resting pulse was 57.  She was no longer taking valsartan and I added amlodipine 5 mg for more optimal blood pressure control.  She  underwent lower extremity Doppler studies on 03/16/2016 by  [facility] which revealed normal ABIs bilaterally. When I saw her in f/u on 03/31/16 BP was improved but was in atrial fibrillation which was new.  She has continued to be on warfarin anticoagulation and her INR was therapeutic.  I further titrated Toprol-XL to 75 mg for 5 days and recommended that she increase it to 100 mg depending upon her heart rate response.  She did take 100 mg, she did experience some dizziness and as result, self reduced her back to 75 mg daily.  She is unaware of her rhythm being irregular.  In March 2018 her blood pressure was stable.  On 09/09/2016, she underwent a one-year follow-up echo Doppler study.  This showed mild LVH with normal systolic function.  There was grade 1 diastolic dysfunction.  The TAVR bioprosthesis was functioning well.  She had mild LA dilation and mild pulmonary hypertension at 40 mm.  She recently underwent lab work in May by Dr. Karie Kirks.  After she left the office.  I was able to obtain these results.  This showed a cholesterol of 128 and LDL of 54, HDL 49, and triglycerides of 168.  Her creatinine was 1.0.  Hemoglobin 15.7, hematocrit 45.8.  She had recently been notified by her pharmacy that the valsartan that she was taken was recently recalled.  When I last saw her, she had stopped taking the valsartan.  I changed her to irbesartan 150 mg.  She was seen by Dr. Meda Coffee, for primary care in [place] in early February 2019. Her INR was therapeutic at 2.6.  She was on amlodipine 5 mg, furosemide as needed, irbesartan 150 mg, and Toprol 25 mg in addition to rosuvastatin 20 mg and warfarin.  Lab from 04/24/17  showed a creatinine of 1.01.  TSH was pending.  Hemoglobin 15, hematocrit 43, but platelets were decreased at 1 25,000.  Total cholesterol is 124, HDL 46, triglycerides 214, and LDL 50.  Vitamin D was very low at 7.  When I saw her in February 2019, her ECG revealed that she was in atrial fibrillation for which she was asymptomatic.  At that time, I  titrated metoprolol succinate to 100 mg daily.  I also started her on 50,000 units of vitamin D weekly for at least 3 months.  She underwent a follow-up echo Doppler study on for 05/21/2017 which showed an EF of 60-65% with grade 2 diastolic dysfunction.  HerTAVR bioprosthesis was well-seated and functioned well.  There was no aortic insufficiency. Her left atrium was severely dilated.  Estimated PA pressure was 34 mm.   I last saw her in March 2019 at which time she was maintaining sinus rhythm and ECG showed sinus bradycardia at 56 bpm with left bundle branch block.  She underwent a carotid study in May 2019 which showed mild carotid disease with 40 to 50% stenosis in the right internal carotid and 1 at 39% in the left.  She had normal subclavian and vertebral flow.  She over the past year, she was seen in September 2019 by Fabian Sharp, PA and was  noting some intermittent leg swelling for which she was taking Lasix as needed approximately 2-3 times per week and also using support stockings.    Presently, she has noticed a rare episode where perhaps her heart rate has been a little irregular.  She denies any chest pain or significant shortness of breath.  She continues to be on amlodipine 5 mg,irbesartan 150 mg, furosemide 20 mg for which he takes it 2-3 times per week, Toprol-XL 100 mg for hypertension and heart rate.  She is on warfarin anticoagulation.  GERD has been controlled with pantoprazole.  In the office today while laying down she did notice some sensation of perhaps mild heart rate irregularity.  She presents for evaluation.  Past Medical History:  Diagnosis Date   Allergy    rhinitis   Aortic stenosis, severe    Aortic stenosis, severe    Arthritis    spine and various joints   Cataract    Childhood asthma    Chronic bronchitis (HCC)        Claudication (HCC)    Coronary artery disease    a. 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   26 mm Edwards Sapien 3 transcatheter heart valve placed via open right transfemoral approach   Seroma, postoperative    Left Groin   Severe aortic valve stenosis 08/31/2014   Stroke ([place]) 1982 X 2   "a little bit weaker on the left side since" (08/29/2014)    Past Surgical History:  Procedure Laterality Date   AORTO-FEMORAL BYPASS GRAFT Bilateral 06/27/10   BASAL CELL CARCINOMA EXCISION Left    face   CARDIAC CATHETERIZATION N/A 07/25/2014   Procedure: Right/Left Heart Cath and Coronary Angiography;  Surgeon: Troy Sine, MD;  Location: [facility];  Service: Cardiovascular;  Laterality: N/A;   CATARACT EXTRACTION, BILATERAL Bilateral 09/29/2016   COLONOSCOPY  2008   Dr. Laural Golden: normal.    DILATION AND CURETTAGE OF UTERUS  "2 or 3"   ESOPHAGOGASTRODUODENOSCOPY N/A 08/18/2017   Procedure: ESOPHAGOGASTRODUODENOSCOPY (EGD);  Surgeon: Daneil Dolin, MD;  [facility];  Service: Endoscopy;  Laterality: N/A;  2:00pm   MALONEY DILATION N/A 08/18/2017   Procedure: Venia Minks DILATION;  Surgeon: Daneil Dolin, MD;  Location: [facility];  Service: Endoscopy;  Laterality: N/A;   MULTIPLE TOOTH EXTRACTIONS     PR VEIN BYPASS GRAFT,AORTO-FEM-POP  06/12/10   TEE WITHOUT CARDIOVERSION N/A 08/31/2014   Procedure: TRANSESOPHAGEAL ECHOCARDIOGRAM (TEE);  Surgeon: Rexene Alberts, MD;  Location: [facility];  Service: Open Heart Surgery;  Laterality: N/A;   TRANSCATHETER AORTIC VALVE REPLACEMENT, TRANSFEMORAL N/A 08/31/2014   Procedure: TRANSCATHETER AORTIC VALVE REPLACEMENT, TRANSFEMORAL approach;  Surgeon:  Rexene Alberts, MD;  Location: [facility];  Service: Open Heart Surgery;  Laterality: N/A;   TUBAL LIGATION  1970's   VAGINAL HYSTERECTOMY  1970's   Partial     Allergies  Allergen Reactions   Adhesive [Tape] Rash   Latex Rash   Tetanus Toxoids Rash    Current Outpatient Medications  Medication Sig Dispense Refill  desoximetasone (TOPICORT) 0.05 % cream Apply topically 2 (two) times daily. 30 g 0   diltiazem (CARDIZEM CD) 180 MG 24 hr capsule Take 1 capsule (180 mg total) by mouth daily. 90 capsule 3   estradiol (ESTRACE) 2 MG tablet TAKE 1 TABLET BY MOUTH ONCE DAILY 90 tablet 0   furosemide (LASIX) 20 MG tablet Take 20 mg by mouth daily as needed for fluid.      ipratropium (ATROVENT) 0.06 % nasal spray Place 1 spray into both nostrils every 6 (six) hours as needed for rhinitis. 16 mL 5   irbesartan (AVAPRO) 150 MG tablet TAKE 1 TABLET BY MOUTH ONCE DAILY 90 tablet 1   meloxicam (MOBIC) 15 MG tablet Take 15 mg by mouth daily.  11   metoprolol succinate (TOPROL-XL) 100 MG 24 hr tablet TAKE 1 TABLET BY MOUTH ONCE DAILY 90 tablet 1   pantoprazole (PROTONIX) 40 MG tablet TAKE 1 TABLET BY MOUTH ONCE DAILY BEFORE SUPPER 30 tablet 5   potassium chloride (KLOR-CON) 10 MEQ CR tablet Take 10 mEq by mouth daily as needed (when taking Lasix).      rosuvastatin (CRESTOR) 20 MG tablet TAKE 1 TABLET BY MOUTH ONCE DAILY 90 tablet 0   Vitamin D, Ergocalciferol, (DRISDOL) 50000 units CAPS capsule TAKE 1 CAPSULE BY MOUTH ONCE A WEEK (Patient taking differently: TAKE 1 CAPSULE BY MOUTH ONCE A WEEK ON THURSDAY) 12 capsule 0   warfarin (COUMADIN) 3 MG tablet Take 3 mg by mouth daily at 6 PM.      nitroGLYCERIN (NITROSTAT) 0.4 MG SL tablet Place 1 tablet (0.4 mg total) under the tongue every 5 (five) minutes as needed for chest pain. (Patient not taking: Reported on 05/12/2018) 25 tablet 6   No current facility-administered medications for this visit.     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 strain: Not very hard   Food insecurity:    Worry: Never true    Inability: Never true   Transportation needs:    Medical: No    Non-medical: No  Tobacco Use   Smoking status: Former Smoker    Packs/day: 2.00    Years: 56.00    Pack years: 112.00    Types: Cigarettes    Start date: 03/02/1952    Last attempt to quit: 06/01/2010    Years since quitting: 7.9   Smokeless tobacco: Never Used   Tobacco comment: HAD WEANED DOWN TO 1/4 PK A DAY BEFORE D/C  Substance and Sexual Activity   Alcohol use: No    Alcohol/week: 0.0 standard drinks    Frequency: Never    Comment: 08/29/2014 "never drank much; quit in the 1990's"   Drug use: No   Sexual activity: Yes    Partners: Male  Lifestyle   Physical activity:    Days per week: 0 days    Minutes per session: 0 min   Stress: Only a little  Relationships   Social connections:    Talks on phone: More than three times a week    Gets together: More than three times a week    Attends religious service: Patient refused    Active member of club or organization: Patient refused  Attends meetings of clubs or organizations: Never    Relationship status: Married   Intimate partner violence:    Fear of current or ex partner: No    Emotionally abused: No    Physically abused: No    Forced sexual activity: No  Other Topics Concern   Not on file  Social History Narrative   Married. Has one daughter from first marriage, second husband has three daughters.    Daughter livers here.      Mortimer Fries has 3 daughters   Patient has one daughter, 3 grands   Likes to crochet   Works in [employer].     Family History  Problem Relation Age of Onset   Heart  disease Mother 77       died young   Varicose Veins Mother    Stroke Father    Hypertension Father    Heart disease Father 73       Aneurysm   Heart attack Father    Arthritis Brother    Stroke Brother    Diabetes Daughter    Arthritis Daughter    Allergic rhinitis Neg Hx    Angioedema Neg Hx    Asthma Neg Hx    Atopy Neg Hx    Eczema Neg Hx    Immunodeficiency Neg Hx    Urticaria Neg Hx    Social history is notable that she is married has one child. She quit tobacco in April 2012 after a long-standing tobacco history having started smoking at age 40.   ROS General: Negative; No fevers, chills, or night sweats;  HEENT: Negative; No changes in vision or hearing, sinus congestion, difficulty swallowing Pulmonary: Negative; No cough, wheezing, shortness of breath, hemoptysis Cardiovascular: See history of present illness.   GI: Negative; No nausea, vomiting, diarrhea, or abdominal pain GU: Negative; No dysuria, hematuria, or difficulty voiding Musculoskeletal: Negative; no myalgias, joint pain, or weakness Hematologic/Oncology: Negative; no easy bruising, bleeding Endocrine: Negative; no heat/cold intolerance; no diabetes Neuro: Negative; no changes in balance, headaches Skin: Negative; No rashes or skin lesions Psychiatric: Negative; No behavioral problems, depression Sleep: Negative; No snoring, daytime sleepiness, hypersomnolence, bruxism, restless legs, hypnogognic hallucinations, no cataplexy Other comprehensive 14 point system review is negative.   PE BP (!) 144/86    Pulse 87    Ht '5\' 10"'  (1.778 m)    Wt 189 lb 3.2 oz (85.8 kg)    SpO2 96%    BMI 27.15 kg/m    Repeat blood pressure by me 142/86.  Wt Readings from Last 3 Encounters:  05/12/18 189 lb 3.2 oz (85.8 kg)  03/31/18 195 lb (88.5 kg)  02/17/18 198 lb 12.8 oz (90.2 kg)   General: Alert, oriented, no distress.  Skin: normal turgor, no rashes, warm and dry HEENT: Normocephalic, atraumatic.  Pupils equal round and reactive to light; sclera anicteric; extraocular muscles intact;  Nose without nasal septal hypertrophy Mouth/Parynx benign; Mallinpatti scale 3 Neck: No JVD, no carotid bruits; normal carotid upstroke Lungs: clear to ausculatation and percussion; no wheezing or rales March 2019 chest wall: without tenderness to palpitation Heart: PMI not displaced, irregularly irregular with ventricular rate in the 80s, s1 s2 normal, 2/6 systolic murmur, no diastolic murmur, no rubs, gallops, thrills, or heaves Abdomen: soft, nontender; no hepatosplenomehaly, BS+; abdominal aorta nontender and not dilated by palpation. Back: no CVA tenderness Pulses 2+ Musculoskeletal: full range of motion, normal strength, no joint deformities Extremities: no clubbing cyanosis or edema, Homan's sign negative  Neurologic:  grossly nonfocal; Cranial nerves grossly wnl Psychologic: Normal mood and affect  ECG (independently read by me): Atrial fibrillation with ventricular rate at 87.  Left bundle branch block with repolarization changes.  March 2019 ECG (independently read by me): sinus bradycardia 56 bpm.  Left bundle branch block.  PR interval 198 ms.  04/14/2016 ECG (independently read by me): Atrial fibrillation at 77 bpm left bundle branch block.  Left axis deviation.  QTc interval 520 ms  August 2018 ECG (independently read by me): Sinus bradycardia with occasional PACs.  Left axis deviation.  Borderline first-degree AV block with a PR interval of 22 ms.  Left bundle-branch block  March 2018 ECG (independently read by me): Sinus bradycardia 52 bpm.  First-degree AV block with a PR interval of 210 ms.  Left bundle branch block with repolarization changes.  PE the C.  January 2018 ECG (independently read by me): Atrial fibrillation with a rate at 91.  Left bundle-branch block.  December 2017 ECG (independently read by me): Sinus bradycardia 57 bpm.  Left bundle branch block with repolarization  changes.  October 2017 ECG (independently read by me): Sinus rhythm at 74 bpm with frequent PVCs in a trigeminal pattern.  March 2017 ECG (independently read by me): Sinus bradycardia 54 bpm.  Left bundle branch block with repolarization changes.  November 2016 ECG (independently read by me): Sinus bradycardia 55 bpm with occasional premature atrial complex.  Left bundle-branch block.  February 2016 ECG (independently read by me):  Sinus bradycardia 57 bpm.  Left bundle branch block with repolarization changes..  No ectopy.  December 2015 ECG (independently read by me): Sinus bradycardia 58 bpm.  Mild left atrial enlargement.  Left axis deviation.  Left bundle branch block with repolarization changes.  October 2014 ECG: Sinus bradycardia with left axis deviation. Left bundle branch block with repolarization changes  LAB BMP Latest Ref Rng & Units 04/13/2017 10/22/2016 04/20/2016  Glucose 65 - 139 mg/dL 90 117(H) 99  BUN 7 - 25 mg/dL '20 20 21  ' Creatinine 0.60 - 0.93 mg/dL 1.01(H) 1.18(H) 1.14(H)  BUN/Creat Ratio 6 - 22 (calc) 20 - -  Sodium 135 - 146 mmol/L 138 140 141  Potassium 3.5 - 5.3 mmol/L 4.8 4.8 4.6  Chloride 98 - 110 mmol/L 105 105 106  CO2 20 - 32 mmol/L '29 25 28  ' Calcium 8.6 - 10.4 mg/dL 8.8 9.0 8.9   Hepatic Function Latest Ref Rng & Units 04/13/2017 10/22/2016 03/13/2016  Total Protein 6.1 - 8.1 g/dL 6.6 6.0(L) 6.2  Albumin 3.6 - 5.1 g/dL - 3.8 3.8  AST 10 - 35 U/L '16 21 19  ' ALT 6 - 29 U/L '12 16 13  ' Alk Phosphatase 33 - 130 U/L - 79 76  Total Bilirubin 0.2 - 1.2 mg/dL 0.6 0.5 0.4   CBC Latest Ref Rng & Units 04/13/2017 03/03/2017 10/22/2016  WBC 3.8 - 10.8 Thousand/uL 5.7 6.9 6.3  Hemoglobin 11.7 - 15.5 g/dL 15.0 14.6 14.6  Hematocrit 35.0 - 45.0 % 43.8 46.2(H) 44.3  Platelets 140 - 400 Thousand/uL 125(L) 130(L) 134(L)   Lab Results  Component Value Date   MCV 89.9 04/13/2017   MCV 96.3 03/03/2017   MCV 93.7 10/22/2016   Lab Results  Component Value Date   TSH 3.33  04/13/2017   Lipid Panel     Component Value Date/Time   CHOL 124 04/13/2017 1439   CHOL 118 12/25/2015 0909   TRIG 214 (H) 04/13/2017 1439   HDL 46 (L) 04/13/2017  1439   HDL 45 12/25/2015 0909   CHOLHDL 2.7 04/13/2017 1439   VLDL 40 (H) 01/16/2015 1028   LDLCALC 50 04/13/2017 1439     RADIOLOGY: No results found.  IMPRESSION:  1. 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 PAF (paroxysmal atrial fibrillation) ([place])   3. Essential hypertension   4. CAD in native artery   5. Bilateral carotid artery stenosis   6. Warfarin anticoagulation   7. Hyperlipidemia with target LDL less than 70     ASSESSMENT AND PLAN: Ms. Smyre is a 78 year old female who has documented CAD and PVD who developed progressive aortic valve stenosis and underwent successful TAVR procedure on 08/31/2014 with placement of a 26 mm Edward Sapien 3 Valve from the right femoral approach.  She later  developed stage II hypertension resulting in increased medical regimen.  .  She developed postoperative atrial fibrillation and previouslyconverted back to sinus rhythm on her increased beta blocker dose.  She is on warfarin for anticoagulation and denies bleeding.  An echo Doppler study in July 2018 continued to show normal systolic function.  Her bioprosthetic TAVR valve was functioning well without mobility restriction.  The mean gradient was 10 with a peak gradient of 21.  When I saw her on 04/14/2017, her ECG demonstrated recurrent atrial fibrillation of questionable duration.  She was anticoagulated on warfarin.  I further titrated metoprolol succinate up to 100 mg daily.  Her last echo Doppler study in March 2019 revealed normal systolic function, and grade 2 diastolic dysfunction.  Her bioprosthetic valve was functioning well.  The mean gradient was 12 and peak gradient 25.  There was no aortic insufficiency.  PA peak pressure was 34 mm.  Ms. Zeiger EKG today confirms that she is back in atrial  fibrillation.  She believes this may have just occurred but she is uncertain.  She continues to be on anticoagulation with warfarin.  With her recurrent AF I am recommending discontinuance of amlodipine and in its place I will start Cardizem CD 180 mg.  She will continue with her current dose of Toprol-XL 100 mg daily.  Her blood pressure today was mildly elevated.  Further titration of medications may be necessary.  I am recommending a follow-up echo Doppler study to reassess her TAVR valve as well as systolic and diastolic function with recurrent AF.  She continues to be on [employer] in addition to her furosemide which he now is taking 2-3 times per week depending upon leg swelling.  She also takes meloxicam on a daily basis which may be contributing to some of her leg edema intermittently.  She is on rosuvastatin 20 mg.  Target LDL is less than 70.  She is tolerating this.  I am recommending a complete set of fasting laboratory be obtained including a chemistry profile, museum, CBC, TSH, and fasting lipids.  I will see her in 3 to 4 weeks for follow-up evaluation.  Time spent: 25 minutes  Troy Sine, MD, [facility]  05/12/2018 1:45 PM

## 2018-05-12 NOTE — Patient Instructions (Signed)
Medication Instructions:  Stop Amlodipine Start Cardizem CD 180 mg.  If you need a refill on your cardiac medications before your next appointment, please call your pharmacy.   Lab work: Fasting lab work (CBC, CMET, TSH, LIPID, [place])  If you have labs (blood work) drawn today and your tests are completely normal, you will receive your results only by: Marland Kitchen MyChart Message (if you have MyChart) OR . A paper copy in the mail If you have any lab test that is abnormal or we need to change your treatment, we will call you to review the results.  Testing/Procedures: Echocardiogram - Your physician has requested that you have an echocardiogram. Echocardiography is a painless test that uses sound waves to create images of your heart. It provides your doctor with information about the size and shape of your heart and how well your heart's chambers and valves are working. This procedure takes approximately one hour. There are no restrictions for this procedure. This will be performed at our [facility] location - [address], [address].   Follow-Up: At [facility], you and your health needs are our priority.  As part of our continuing mission to provide you with exceptional heart care, we have created designated Provider Care Teams.  These Care Teams include your primary Cardiologist (physician) and Advanced Practice Providers (APPs -  Physician Assistants and Nurse Practitioners) who all work together to provide you with the care you need, when you need it. You will need a follow up appointment in 3-4 weeks.  Please call our office 2 months in advance to schedule this appointment.  You may see Shelva Majestic, MD or one of the following Advanced Practice Providers on your designated Care Team: [place], [place] . Fabian Sharp, PA-C

## 2018-05-17 DIAGNOSIS — I10 Essential (primary) hypertension: Secondary | ICD-10-CM | POA: Diagnosis not present

## 2018-05-17 DIAGNOSIS — Z952 Presence of prosthetic heart valve: Secondary | ICD-10-CM | POA: Diagnosis not present

## 2018-05-17 DIAGNOSIS — I480 Paroxysmal atrial fibrillation: Secondary | ICD-10-CM | POA: Diagnosis not present

## 2018-05-18 LAB — CBC
Hematocrit: 47 % — ABNORMAL HIGH (ref 34.0–46.6)
Hemoglobin: 15.4 g/dL (ref 11.1–15.9)
MCH: 30.2 pg (ref 26.6–33.0)
MCHC: 32.8 g/dL (ref 31.5–35.7)
MCV: 92 fL (ref 79–97)
PLATELETS: 163 10*3/uL (ref 150–450)
RBC: 5.1 x10E6/uL (ref 3.77–5.28)
RDW: 13.2 % (ref 11.7–15.4)
WBC: 7.5 10*3/uL (ref 3.4–10.8)

## 2018-05-18 LAB — COMPREHENSIVE METABOLIC PANEL
ALT: 14 IU/L (ref 0–32)
AST: 19 IU/L (ref 0–40)
Albumin/Globulin Ratio: 1.8 (ref 1.2–2.2)
Albumin: 4 g/dL (ref 3.7–4.7)
Alkaline Phosphatase: 76 IU/L (ref 39–117)
BUN/Creatinine Ratio: 16 (ref 12–28)
BUN: 17 mg/dL (ref 8–27)
Bilirubin Total: 1 mg/dL (ref 0.0–1.2)
CHLORIDE: 103 mmol/L (ref 96–106)
CO2: 24 mmol/L (ref 20–29)
Calcium: 9 mg/dL (ref 8.7–10.3)
Creatinine, Ser: 1.06 mg/dL — ABNORMAL HIGH (ref 0.57–1.00)
GFR calc Af Amer: 59 mL/min/{1.73_m2} — ABNORMAL LOW (ref >59)
GFR calc non Af Amer: 51 mL/min/{1.73_m2} — ABNORMAL LOW (ref >59)
GLOBULIN, TOTAL: 2.2 g/dL (ref 1.5–4.5)
Glucose: 96 mg/dL (ref 65–99)
POTASSIUM: 5.3 mmol/L — AB (ref 3.5–5.2)
SODIUM: 139 mmol/L (ref 134–144)
Total Protein: 6.2 g/dL (ref 6.0–8.5)

## 2018-05-18 LAB — MAGNESIUM: Magnesium: 1.9 mg/dL (ref 1.6–2.3)

## 2018-05-18 LAB — LIPID PANEL
Chol/HDL Ratio: 2.4 ratio (ref 0.0–4.4)
Cholesterol, Total: 115 mg/dL (ref 100–199)
HDL: 48 mg/dL (ref >39)
LDL Calculated: 46 mg/dL (ref 0–99)
Triglycerides: 107 mg/dL (ref 0–149)
VLDL Cholesterol Cal: 21 mg/dL (ref 5–40)

## 2018-05-18 LAB — TSH: TSH: 2.5 u[IU]/mL (ref 0.450–4.500)

## 2018-05-19 ENCOUNTER — Other Ambulatory Visit: Payer: Self-pay

## 2018-05-19 ENCOUNTER — Ambulatory Visit (HOSPITAL_COMMUNITY): Payer: Medicare Other | Attending: Cardiology

## 2018-05-19 DIAGNOSIS — I10 Essential (primary) hypertension: Secondary | ICD-10-CM | POA: Diagnosis not present

## 2018-05-19 DIAGNOSIS — Z952 Presence of prosthetic heart valve: Secondary | ICD-10-CM | POA: Diagnosis not present

## 2018-05-19 DIAGNOSIS — I480 Paroxysmal atrial fibrillation: Secondary | ICD-10-CM | POA: Diagnosis not present

## 2018-05-22 ENCOUNTER — Other Ambulatory Visit: Payer: Self-pay | Admitting: Nurse Practitioner

## 2018-05-26 ENCOUNTER — Encounter (INDEPENDENT_AMBULATORY_CARE_PROVIDER_SITE_OTHER): Payer: Self-pay | Admitting: Internal Medicine

## 2018-05-26 DIAGNOSIS — Z7901 Long term (current) use of anticoagulants: Secondary | ICD-10-CM | POA: Diagnosis not present

## 2018-05-31 DIAGNOSIS — E559 Vitamin D deficiency, unspecified: Secondary | ICD-10-CM | POA: Diagnosis not present

## 2018-05-31 DIAGNOSIS — I10 Essential (primary) hypertension: Secondary | ICD-10-CM | POA: Diagnosis not present

## 2018-05-31 DIAGNOSIS — R5383 Other fatigue: Secondary | ICD-10-CM | POA: Diagnosis not present

## 2018-06-09 ENCOUNTER — Telehealth: Payer: Self-pay

## 2018-06-09 NOTE — Telephone Encounter (Signed)
Virtual Visit Pre-Appointment Phone Call  Steps For Call:  1. Confirm consent - "In the setting of the current Covid19 crisis, you are scheduled for a (phone or video) visit with your provider on (date) at (time).  Just as we do with many in-office visits, in order for you to participate in this visit, we must obtain consent.  If you'd like, I can send this to your mychart (if signed up) or email for you to review.  Otherwise, I can obtain your verbal consent now.  All virtual visits are billed to your insurance company just like a normal visit would be.  By agreeing to a virtual visit, we'd like you to understand that the technology does not allow for your provider to perform an examination, and thus may limit your provider's ability to fully assess your condition.  Finally, though the technology is pretty good, we cannot assure that it will always work on either your or our end, and in the setting of a video visit, we may have to convert it to a phone-only visit.  In either situation, we cannot ensure that we have a secure connection.  Are you willing to proceed?"  2. Give patient instructions for WebEx download to smartphone as below if video visit  3. Advise patient to be prepared with any vital sign or heart rhythm information, their current medicines, and a piece of paper and pen handy for any instructions they may receive the day of their visit  4. Inform patient they will receive a phone call 15 minutes prior to their appointment time (may be from unknown caller ID) so they should be prepared to answer  5. Confirm that appointment type is correct in Epic appointment notes (video vs telephone)    TELEPHONE CALL NOTE  Katelyn [name] has been deemed a candidate for a follow-up tele-health visit to limit community exposure during the Covid-19 pandemic. I spoke with the patient via phone to ensure availability of phone/video source, confirm preferred email & phone number, and discuss  instructions and expectations.  I reminded Katelyn [name] to be prepared with any vital sign and/or heart rhythm information that could potentially be obtained via home monitoring, at the time of her visit. I reminded Katelyn [name] to expect a phone call at the time of her visit if her visit.  Did the patient verbally acknowledge consent to treatment? Patient provided verbal consent.    Mitzie Na, [place] 06/09/2018 11:18 AM   DOWNLOADING THE [place]  - If Apple, go to [employer] and type in WebEx in the search bar. [place] [employer], the blue/green circle. The app is free but as with any other app downloads, their phone may require them to verify saved payment information or Apple password. The patient does NOT have to create an account.  - If Android, ask patient to go to [employer] and type in WebEx in the search bar. [place] [employer], the blue/green circle. The app is free but as with any other app downloads, their phone may require them to verify saved payment information or Android password. The patient does NOT have to create an account.   CONSENT FOR TELE-HEALTH VISIT - PLEASE REVIEW  I hereby voluntarily request, consent and authorize [facility] and its employed or contracted physicians, physician assistants, nurse practitioners or other licensed health care professionals (the Practitioner), to provide me with telemedicine health care services (the "Services") as deemed  necessary by the treating Practitioner. I acknowledge and consent to receive the Services by the Practitioner via telemedicine. I understand that the telemedicine visit will involve communicating with the Practitioner through live audiovisual communication technology and the disclosure of certain medical information by electronic transmission. I acknowledge that I have been given the opportunity to request an in-person assessment or other available alternative  prior to the telemedicine visit and am voluntarily participating in the telemedicine visit.  I understand that I have the right to withhold or withdraw my consent to the use of telemedicine in the course of my care at any time, without affecting my right to future care or treatment, and that the Practitioner or I may terminate the telemedicine visit at any time. I understand that I have the right to inspect all information obtained and/or recorded in the course of the telemedicine visit and may receive copies of available information for a reasonable fee.  I understand that some of the potential risks of receiving the Services via telemedicine include:  Marland Kitchen Delay or interruption in medical evaluation due to technological equipment failure or disruption; . Information transmitted may not be sufficient (e.g. poor resolution of images) to allow for appropriate medical decision making by the Practitioner; and/or  . In rare instances, security protocols could fail, causing a breach of personal health information.  Furthermore, I acknowledge that it is my responsibility to provide information about my medical history, conditions and care that is complete and accurate to the best of my ability. I acknowledge that Practitioner's advice, recommendations, and/or decision may be based on factors not within their control, such as incomplete or inaccurate data provided by me or distortions of diagnostic images or specimens that may result from electronic transmissions. I understand that the practice of medicine is not an exact science and that Practitioner makes no warranties or guarantees regarding treatment outcomes. I acknowledge that I will receive a copy of this consent concurrently upon execution via email to the email address I last provided but may also request a printed copy by calling the office of [facility].    I understand that my insurance will be billed for this visit.   I have read or had this  consent read to me. . I understand the contents of this consent, which adequately explains the benefits and risks of the Services being provided via telemedicine.  . I have been provided ample opportunity to ask questions regarding this consent and the Services and have had my questions answered to my satisfaction. . I give my informed consent for the services to be provided through the use of telemedicine in my medical care  By participating in this telemedicine visit I agree to the above.

## 2018-06-14 ENCOUNTER — Telehealth: Payer: Self-pay | Admitting: Cardiovascular Disease

## 2018-06-14 NOTE — Telephone Encounter (Signed)
Smart phone/mychart/consent obtained/pre reg complete/04.14.20/dc

## 2018-06-15 ENCOUNTER — Telehealth (INDEPENDENT_AMBULATORY_CARE_PROVIDER_SITE_OTHER): Payer: Medicare Other | Admitting: Cardiovascular Disease

## 2018-06-15 ENCOUNTER — Encounter: Payer: Self-pay | Admitting: Cardiovascular Disease

## 2018-06-15 VITALS — BP 112/62 | Ht 70.0 in | Wt 189.0 lb

## 2018-06-15 DIAGNOSIS — Z7901 Long term (current) use of anticoagulants: Secondary | ICD-10-CM

## 2018-06-15 DIAGNOSIS — I2510 Atherosclerotic heart disease of native coronary artery without angina pectoris: Secondary | ICD-10-CM

## 2018-06-15 DIAGNOSIS — I10 Essential (primary) hypertension: Secondary | ICD-10-CM

## 2018-06-15 DIAGNOSIS — I480 Paroxysmal atrial fibrillation: Secondary | ICD-10-CM | POA: Diagnosis not present

## 2018-06-15 DIAGNOSIS — Z952 Presence of prosthetic heart valve: Secondary | ICD-10-CM

## 2018-06-15 DIAGNOSIS — E785 Hyperlipidemia, unspecified: Secondary | ICD-10-CM | POA: Diagnosis not present

## 2018-06-15 DIAGNOSIS — M7989 Other specified soft tissue disorders: Secondary | ICD-10-CM

## 2018-06-15 NOTE — Patient Instructions (Signed)
Medication Instructions:  Try to decrease Meloxicam.  If you need a refill on your cardiac medications before your next appointment, please call your pharmacy.   Follow-Up: At [facility], you and your health needs are our priority.  As part of our continuing mission to provide you with exceptional heart care, we have created designated Provider Care Teams.  These Care Teams include your primary Cardiologist (physician) and Advanced Practice Providers (APPs -  Physician Assistants and Nurse Practitioners) who all work together to provide you with the care you need, when you need it. You will need a follow up appointment in 3-4 months.  You may see Shelva Majestic, MD or one of the following Advanced Practice Providers on your designated Care Team: [place], [place] . Fabian Sharp, PA-C

## 2018-06-15 NOTE — Progress Notes (Signed)
Virtual Visit via Telephone Note   This visit type was conducted due to national recommendations for restrictions regarding the COVID-19 Pandemic (e.g. social distancing) in an effort to limit this patient's exposure and mitigate transmission in our community.  Due to her co-morbid illnesses, this patient is at least at moderate risk for complications without adequate follow up.  This format is felt to be most appropriate for this patient at this time.  The patient did not have access to video technology/had technical difficulties with video requiring transitioning to audio format only (telephone).  The patient's smart phone was not working properly.  All issues noted in this document were discussed and addressed.  No physical exam could be performed with this format.  Please refer to the patient's chart for her  consent to telehealth for [facility].  Consent was obtained on June 14, 2018  Evaluation Performed:  Follow-up visit  Date:  06/15/2018   ID:  Katelyn [name], DOB 1941-02-09, MRN [id number]  Patient Location: Home Provider Location: Home  PCP:  Doree Albee, MD  Cardiologist:  Shelva Majestic, MD Electrophysiologist:  None   Chief Complaint: Follow-up TAVR and recent recurrent atrial fibrillation  History of Present Illness:    Katelyn [name] is a 78 y.o. female  has known CAD and underwent initial cardiac catheterization in  April 2012 which revealed mid RCA occlusion with collaterals, 50% LAD stenosis, 30% circumflex stenosis, and 40% marginal stenosis. 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 Trula Slade. She has a history of hypertension with grade 2 diastolic dysfunction, hyperlipidemia, and has developed moderately severe aortic valve stenosis. An echo Doppler study in May 2013 showed a mean transvalvular aortic gradient of 34  with a maximum of 56. In December 2013, her mean gradient was 32 and peak 56 with a valve area of 0.84. An echo Doppler study on 12/06/2012 continued to show normal systolic function with an ejection fraction of 60-65%. She had grade 1 diastolic dysfunction. Peak gradients were  61 mm and mean gradient was 38 mm.  The aortic valve area 0.8-0.9 cm. She had mild to moderate aortic insufficiency. Had mildly thickened mitral valve leaflets with mild MR. The left atrium was mildly dilated. She had mild TR. Pulmonary pressure was mildly increased at 33 mm.  A follow-up echo Doppler study on 02/12/2014  Showed normal EF at 55-60%. Her aortic valve was severely calcified and stenosis was felt to be severe with a mean gradient of 42, a peak gradient of 64 which had slightly increaed from 38 and 61 mm, respectively.  She began to develop exertional chest discomfort with associated dyspnea and fatigue.  She underwent repeat cardiac catheterization on 07/25/2014 which showed a chronically occluded RCA with left-to-right collaterals, a 70% diagonal stenosis, 50% ostial circumflex stenosis and 40% AV groove circumflex stenosis.  Her aortic stenosis at further progressed such that she had a mean gradient of 42 mm.  She was felt to be a poor candidate for conventional aortic valve replacement and on 08/31/2014 underwent successful  TAVR with placement of a 26 mm Edward Sapien 3 valve from the right femoral approach by Drs. Roxy Manns and [place].  She developed postoperative atrial fibrillation for which she was transiently treated with amiodarone and when seen for office follow-up she was back in sinus rhythm.  A subsequent echo on 10/01/2014 showed an EF of  60-65% with a mean aortic gradient of 16 mm and peak gradient 26 with trivial aortic insufficiency.    A follow-up echo Doppler study on 09/09/2015 showed an EF of 60-65% and grade 2 diastolic dysfunction.  There were no wall motion abnormalities.  Her 26 mm Edward's  Sapien 3 valve was well-seated from her TAVR procedure.  The mean valve gradient was 16 mm.  There was no evidence for perivalvular leak.  There was mitral annular calcification with mild MR, mild LA dilatation and mild dilation of her right ventricle.  There is mild coronary hypertension with a PA pressure 33 mm.    When I  saw her in October 2017, her dose of valsartan had been reduced prior to the office visit by Dr. Karie Kirks.  At that time, she was having more frequent palpitations and had trigeminy on her ECG.  With her being on reduce valsartan.  I recommended further titration of her beta blocker and increased her Toprol to 50 mg daily.    When I saw her in December 2017 her blood pressure was significantly elevated.  Her resting pulse was 57.  She was no longer taking valsartan and I added amlodipine 5 mg for more optimal blood pressure control.  She  underwent lower extremity Doppler studies on 03/16/2016 by [place] which revealed normal ABIs bilaterally. When I saw her in f/u on 03/31/16 BP was improved but was in atrial fibrillation which was new.  She has continued to be on warfarin anticoagulation and her INR was therapeutic.  I further titrated Toprol-XL to 75 mg for 5 days and recommended that she increase it to 100 mg depending upon her heart rate response.  She did take 100 mg, she did experience some dizziness and as result, self reduced her back to 75 mg daily.  She is unaware of her rhythm being irregular.  In March 2018 her blood pressure was stable.  On 09/09/2016, she underwent a one-year follow-up echo Doppler study.  This showed mild LVH with normal systolic function.  There was grade 1 diastolic dysfunction.  The TAVR bioprosthesis was functioning well.  She had mild LA dilation and mild pulmonary hypertension at 40 mm.  She recently underwent lab work in May by Dr. Karie Kirks.  After she left the office.  I was able to obtain these results.  This showed a cholesterol of 128 and LDL  of 54, HDL 49, and triglycerides of 168.  Her creatinine was 1.0.  Hemoglobin 15.7, hematocrit 45.8.  She had recently been notified by her pharmacy that the valsartan that she was taken was recently recalled.  When I last saw her, she had stopped taking the valsartan.  I changed her to irbesartan 150 mg.  She was seen by Dr. Meda Coffee, for primary care in [place] in early February 2019. Her INR was therapeutic at 2.6.  She was on amlodipine 5 mg, furosemide as needed, irbesartan 150 mg, and Toprol 25 mg in addition to rosuvastatin 20 mg and warfarin.  Lab from 04/24/17  showed a creatinine of 1.01.  TSH was pending.  Hemoglobin 15, hematocrit 43, but platelets were decreased at 1 25,000.  Total cholesterol is 124, HDL 46, triglycerides 214, and LDL 50.  Vitamin D was very low at 7.  When I saw her in February 2019, her ECG revealed that she was in atrial fibrillation for which she was asymptomatic.  At that time, I titrated metoprolol succinate to 100 mg daily.  I also started her  on 50,000 units of vitamin D weekly for at least 3 months.  She underwent a follow-up echo Doppler study on for 05/21/2017 which showed an EF of 60-65% with grade 2 diastolic dysfunction.  HerTAVR bioprosthesis was well-seated and functioned well.  There was no aortic insufficiency. Her left atrium was severely dilated. Estimated PA pressure was 34 mm.   When I saw her in March 2019 at which time she was maintaining sinus rhythm and ECG showed sinus bradycardia at 56 bpm with left bundle branch block.  She underwent a carotid study in May 2019 which showed mild carotid disease with 40 to 50% stenosis in the right internal carotid and 1 at 39% in the left.  She had normal subclavian and vertebral flow.  She over the past year, she was seen in September 2019 by Fabian Sharp, PA and was noting some intermittent leg swelling for which she was taking Lasix as needed approximately 2-3 times per week and also using support stockings.     I last saw her on May 09, 2018 presently, at which time she had noticed a rare episode of heart rate irregularity.  She denied any chest pain or shortness of breath.  She continued to be on amlodipine 5 mg,irbesartan 150 mg, furosemide 20 mg for which he takes it 2-3 times per week, Toprol-XL 100 mg for hypertension and heart rate.  She was on warfarin anticoagulation.  GERD has been controlled with pantoprazole.   Her ECG in the office confirmed that she was back in atrial fibrillation.  Her ventricular rate was 87.  During that evaluation I discontinued amlodipine and substituted this with Cardizem CD 180 mg.  Laboratory was obtained.  She underwent a one-year follow-up echo Doppler study 1 week later on May 19, 2018.  At the time of her echo evaluation she was still in atrial fibrillation.  However, LV function remained normal.  Her TAVR valve was stable and actually gradients appeared slightly less than previously.  Over the past month, she believes her heart rhythm has stabilized.  Obviously she has not had a follow-up EKG.  When she takes her pulse her pulse seems to be running fairly regularly in the 60s.  She does note almost daily ankle swelling and she has increased her furosemide and is now been taking 20 mg daily.  She continues to use support stockings.  She denies chest pain.  She denies recurrent palpitations.  She denies presyncope or syncope.  She presents for evaluation.   The patient does not have symptoms concerning for COVID-19 infection (fever, chills, cough, or new shortness of breath).    Past Medical History:  Diagnosis Date   Allergy    rhinitis   Aortic stenosis, severe    Aortic stenosis, severe    Arthritis    spine and various joints   Cataract    Childhood asthma    Chronic bronchitis (HCC)        Claudication (HCC)    Coronary artery disease    a. 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  26 mm Edwards Sapien 3 transcatheter heart valve placed via open right transfemoral approach   Seroma, postoperative    Left Groin   Severe aortic valve stenosis 08/31/2014   Stroke ([place]) 1982 X 2   "a little bit weaker on the left side since" (08/29/2014)   Past Surgical History:  Procedure Laterality Date   AORTO-FEMORAL BYPASS GRAFT Bilateral 06/27/10   BASAL CELL CARCINOMA EXCISION Left    face   CARDIAC CATHETERIZATION N/A 07/25/2014   Procedure: Right/Left Heart Cath and Coronary Angiography;  Surgeon: Troy Sine, MD;  Location: [place] [facility];  Service: Cardiovascular;  Laterality: N/A;   CATARACT EXTRACTION, BILATERAL Bilateral 09/29/2016   COLONOSCOPY  2008   Dr. Laural Golden: normal.    DILATION AND CURETTAGE OF UTERUS  "2 or 3"   ESOPHAGOGASTRODUODENOSCOPY N/A 08/18/2017   Procedure: ESOPHAGOGASTRODUODENOSCOPY (EGD);  Surgeon: Daneil Dolin, MD;  [facility];  Service: Endoscopy;  Laterality: N/A;  2:00pm   MALONEY DILATION N/A 08/18/2017   Procedure: Venia Minks DILATION;  Surgeon: Daneil Dolin, MD;  [facility];  Service: Endoscopy;  Laterality: N/A;   MULTIPLE TOOTH EXTRACTIONS     PR VEIN BYPASS GRAFT,AORTO-FEM-POP  06/12/10   TEE WITHOUT CARDIOVERSION N/A 08/31/2014   Procedure: TRANSESOPHAGEAL ECHOCARDIOGRAM (TEE);  Surgeon: Rexene Alberts, MD;  Location: [place];  Service: Open Heart Surgery;  Laterality: N/A;   TRANSCATHETER AORTIC VALVE REPLACEMENT, TRANSFEMORAL N/A 08/31/2014   Procedure: TRANSCATHETER AORTIC VALVE REPLACEMENT, TRANSFEMORAL approach;  Surgeon: Rexene Alberts, MD;  Location: [place];  Service: Open Heart Surgery;  Laterality: N/A;   TUBAL LIGATION  1970's   VAGINAL HYSTERECTOMY  1970's   Partial      Current Meds  Medication Sig   desoximetasone (TOPICORT) 0.05 % cream Apply topically 2 (two) times daily.   diltiazem (CARDIZEM CD) 180 MG 24 hr capsule Take 1 capsule (180 mg total) by mouth daily.   estradiol (ESTRACE) 2 MG tablet TAKE 1 TABLET BY MOUTH ONCE DAILY   furosemide (LASIX) 20 MG tablet Take 20 mg by mouth daily as needed for fluid.    ipratropium (ATROVENT) 0.06 % nasal spray Place 1 spray into both nostrils every 6 (six) hours as needed for rhinitis.   irbesartan (AVAPRO) 150 MG tablet TAKE 1 TABLET BY MOUTH ONCE DAILY   meloxicam (MOBIC) 15 MG tablet Take 15 mg by mouth daily.   metoprolol succinate (TOPROL-XL) 100 MG 24 hr tablet TAKE 1 TABLET BY MOUTH ONCE DAILY   nitroGLYCERIN (NITROSTAT) 0.4 MG SL tablet Place 1 tablet (0.4 mg total) under the tongue every 5 (five) minutes as needed for chest pain.   pantoprazole (PROTONIX) 40 MG tablet TAKE 1 TABLET BY MOUTH ONCE DAILY BEFORE SUPPER   potassium chloride (KLOR-CON) 10 MEQ CR tablet Take 10 mEq by mouth daily as needed (when taking Lasix).    rosuvastatin (CRESTOR) 20 MG tablet TAKE 1 TABLET BY MOUTH ONCE DAILY   Vitamin D, Ergocalciferol, (DRISDOL) 50000 units CAPS capsule TAKE 1 CAPSULE BY MOUTH ONCE A WEEK (Patient taking differently: TAKE 1 CAPSULE BY MOUTH ONCE A WEEK ON THURSDAY)   warfarin (COUMADIN) 3 MG tablet Take 3 mg by mouth daily at 6 PM.      Allergies:   Adhesive [tape]; Latex; and Tetanus toxoids   Social History   Tobacco Use   Smoking status: Former Smoker    Packs/day: 2.00    Years: 56.00    Pack years:  112.00    Types: Cigarettes    Start date: 03/02/1952    Last attempt to quit: 06/01/2010    Years since quitting: 8.0   Smokeless tobacco: Never Used   Tobacco comment: HAD WEANED DOWN TO 1/4 PK A DAY BEFORE D/C  Substance Use  Topics   Alcohol use: No    Alcohol/week: 0.0 standard drinks    Frequency: Never    Comment: 08/29/2014 "never drank much; quit in the 1990's"   Drug use: No     Family Hx: The patient's family history includes Arthritis in her brother and daughter; Diabetes in her daughter; Heart attack in her father; Heart disease (age of onset: 57) in her mother; Heart disease (age of onset: 57) in her father; Hypertension in her father; Stroke in her brother and father; Varicose Veins in her mother. There is no history of Allergic rhinitis, Angioedema, Asthma, Atopy, Eczema, Immunodeficiency, or Urticaria.  ROS:   Please see the history of present illness.    Positive for daily ankle swelling. No chest pain PND orthopnea.  No presyncope. No changes in vision or hearing. No wheezing or difficulty breathing. Sleeping well without snoring. No claudication All other systems reviewed and are negative.   Prior CV studies:   The following studies were reviewed today:  ECHO 05/19/2018 IMPRESSIONS   1. The left ventricle has normal systolic function with an ejection fraction of 60-65%. The cavity size was normal. There is mild concentric left ventricular hypertrophy. Left ventricular diastolic function could not be evaluated secondary to atrial  fibrillation.  2. The right ventricle has normal systolic function. The cavity was normal. There is no increase in right ventricular wall thickness.  3. Left atrial size was mildly dilated.  4. Right atrial size was mildly dilated.  5. The mitral valve is grossly normal. There is mild mitral annular calcification present.  6. A 26 an Edwards Edwards Sapien bioprosthetic aortic valve (TAVR) valve is present in the aortic position. Normal aortic valve prosthesis.  7. The ascending aorta is normal in size and structure.  8. The inferior vena cava was normal in size with <50% respiratory variability.  9. When compared to the prior study: TAVR aortic valve gradient  similar to slightly lower as compared to prior.  SUMMARY   Normal LV EF, stable TAVR valve. Unable to assess diastolic function due to afib.  Labs/Other Tests and Data Reviewed:    EKG:   Her last ECG from May 12, 2018 was personally reviewed by me today and shows atrial fibrillation with a ventricular rate at 87, left bundle branch block with repolarization changes.  Recent Labs: 05/17/2018: ALT 14; BUN 17; Creatinine, Ser 1.06; Hemoglobin 15.4; Magnesium 1.9; Platelets 163; Potassium 5.3; Sodium 139; TSH 2.500   Recent Lipid Panel Lab Results  Component Value Date/Time   CHOL 115 05/17/2018 09:13 AM   TRIG 107 05/17/2018 09:13 AM   HDL 48 05/17/2018 09:13 AM   CHOLHDL 2.4 05/17/2018 09:13 AM   CHOLHDL 2.7 04/13/2017 02:39 PM   LDLCALC 46 05/17/2018 09:13 AM   LDLCALC 50 04/13/2017 02:39 PM    Wt Readings from Last 3 Encounters:  06/15/18 189 lb (85.7 kg)  05/12/18 189 lb 3.2 oz (85.8 kg)  03/31/18 195 lb (88.5 kg)     Objective:    Vital Signs:  BP 112/62 Comment: around 3:00pm   Ht 5\' 10"  (1.778 m)    Wt 189 lb (85.7 kg)    BMI 27.12 kg/m  During the evaluation she took her pulse and this was registering in the 25s and appeared to be regular without a appreciation of any arrhythmia or ectopy  Well nourished, well developed female in no acute distress. Breathing is unlabored and normal. She is not short of breath. There is no audible wheezing. She denies any pain in her chest or discomfort with palpation.  She denies abdominal discomfort. She admits to daily swelling since she is on her feet much of the day if he is continue to wear support stockings on a daily basis. She is unaware of any neurologic symptoms. She had normal cognitive function and affect  ASSESSMENT & PLAN:    1. PAF: When the patient was last evaluated 1 month ago, her ECG confirmed that she was back in atrial fibrillation.  She had noticed at that time that she was experiencing some  intermittent episodes of irregularity to her heart rhythm.  At that time, I discontinued amlodipine and started Cardizem in its place.  She has continued to take Toprol-XL 100 mg daily in addition to her Irbesartan.  On her feeling her pulse today her pulse was in the 60s and by her estimate it was regular without awareness of any irregularity.  Hopefully she is converted back to sinus rhythm.  She continues to be on warfarin anticoagulation. 2. Aortic stenosis, status post TAVR: I reviewed her most recent echo Doppler study from May 19, 2018.  LV function remains stable with an EF of 60 to 65%.  There is mild LVH.  Diastolic function could not be assessed due to the atrial fibrillation at the time of the echo.  Aortic valve gradients are stable. 3. CAD: The patient remains asymptomatic on medical therapy. 4. Essential hypertension, remotely stage II.  Blood pressure today remains stable and ideal at 112/62 on her medical regimen. 5. Warfarin anticoagulation: Tolerating this well.  No bleeding 6. Hyperlipidemia with target LDL less than 70: Laboratory from March 2020 shows an LDL excellent at 46.  Her triglycerides have improved from 214 down to 107 on her current dose of rosuvastatin 20 mg. 7. Leg swelling: She is now taking furosemide 20 mg on a daily basis and also is wearing support stockings.  Apparently she has been taking meloxicam for arthritic issues.  I have suggested she try to reduce this and take this as a PRN basis or significantly less frequently than presently since this may also be contributing to her leg swelling.   COVID-19 Education: The signs and symptoms of COVID-19 were discussed with the patient and how to seek care for testing (follow up with PCP or arrange E-visit). The importance of social distancing was discussed today.  Time:   Today, I have spent 25 minutes with the patient with telehealth technology discussing the above problems.     Medication Adjustments/Labs and  Tests Ordered: Current medicines are reviewed at length with the patient today.  Concerns regarding medicines are outlined above.   Tests Ordered: No orders of the defined types were placed in this encounter.   Medication Changes: No orders of the defined types were placed in this encounter.   Disposition:  Follow up 3-4 months  Signed, Shelva Majestic, MD  06/15/2018 3:52 PM    [facility]

## 2018-06-23 DIAGNOSIS — Z7901 Long term (current) use of anticoagulants: Secondary | ICD-10-CM | POA: Diagnosis not present

## 2018-07-21 DIAGNOSIS — Z7901 Long term (current) use of anticoagulants: Secondary | ICD-10-CM | POA: Diagnosis not present

## 2018-07-26 ENCOUNTER — Other Ambulatory Visit: Payer: Self-pay | Admitting: Cardiovascular Disease

## 2018-07-26 DIAGNOSIS — Z7901 Long term (current) use of anticoagulants: Secondary | ICD-10-CM | POA: Diagnosis not present

## 2018-08-02 ENCOUNTER — Ambulatory Visit (HOSPITAL_COMMUNITY)
Admission: RE | Admit: 2018-08-02 | Discharge: 2018-08-02 | Disposition: A | Discharge status: Home / Self Care | Payer: Medicare Other | Source: Ambulatory Visit | Attending: Cardiology | Admitting: Cardiology

## 2018-08-02 ENCOUNTER — Other Ambulatory Visit (HOSPITAL_COMMUNITY): Payer: Self-pay | Admitting: Cardiovascular Disease

## 2018-08-02 ENCOUNTER — Other Ambulatory Visit: Payer: Self-pay

## 2018-08-02 DIAGNOSIS — I6523 Occlusion and stenosis of bilateral carotid arteries: Secondary | ICD-10-CM | POA: Diagnosis not present

## 2018-08-18 DIAGNOSIS — Z7901 Long term (current) use of anticoagulants: Secondary | ICD-10-CM | POA: Diagnosis not present

## 2018-08-25 DIAGNOSIS — Z7901 Long term (current) use of anticoagulants: Secondary | ICD-10-CM | POA: Diagnosis not present

## 2018-08-30 DIAGNOSIS — I10 Essential (primary) hypertension: Secondary | ICD-10-CM | POA: Diagnosis not present

## 2018-08-30 DIAGNOSIS — E785 Hyperlipidemia, unspecified: Secondary | ICD-10-CM | POA: Diagnosis not present

## 2018-08-30 DIAGNOSIS — E2839 Other primary ovarian failure: Secondary | ICD-10-CM | POA: Diagnosis not present

## 2018-08-30 DIAGNOSIS — E559 Vitamin D deficiency, unspecified: Secondary | ICD-10-CM | POA: Diagnosis not present

## 2018-08-30 DIAGNOSIS — R5383 Other fatigue: Secondary | ICD-10-CM | POA: Diagnosis not present

## 2018-09-15 DIAGNOSIS — Z7901 Long term (current) use of anticoagulants: Secondary | ICD-10-CM | POA: Diagnosis not present

## 2018-09-22 LAB — PROTIME-INR: INR: 2.1 — AB (ref 0.9–1.1)

## 2018-09-29 LAB — PROTIME-INR: INR: 2.4 — AB (ref 0.9–1.1)

## 2018-10-04 DIAGNOSIS — H26493 Other secondary cataract, bilateral: Secondary | ICD-10-CM | POA: Diagnosis not present

## 2018-10-13 DIAGNOSIS — Z7901 Long term (current) use of anticoagulants: Secondary | ICD-10-CM | POA: Diagnosis not present

## 2018-10-13 LAB — PROTIME-INR: INR: 2.3 — AB (ref 0.9–1.1)

## 2018-10-25 ENCOUNTER — Other Ambulatory Visit: Payer: Self-pay | Admitting: Cardiovascular Disease

## 2018-10-26 ENCOUNTER — Other Ambulatory Visit: Payer: Self-pay

## 2018-10-26 ENCOUNTER — Ambulatory Visit (INDEPENDENT_AMBULATORY_CARE_PROVIDER_SITE_OTHER): Payer: Medicare Other | Admitting: Cardiovascular Disease

## 2018-10-26 ENCOUNTER — Encounter: Payer: Self-pay | Admitting: Cardiovascular Disease

## 2018-10-26 VITALS — BP 153/82 | HR 67 | Ht 70.0 in | Wt 178.8 lb

## 2018-10-26 DIAGNOSIS — I6523 Occlusion and stenosis of bilateral carotid arteries: Secondary | ICD-10-CM | POA: Diagnosis not present

## 2018-10-26 DIAGNOSIS — E785 Hyperlipidemia, unspecified: Secondary | ICD-10-CM

## 2018-10-26 DIAGNOSIS — I480 Paroxysmal atrial fibrillation: Secondary | ICD-10-CM

## 2018-10-26 DIAGNOSIS — Z952 Presence of prosthetic heart valve: Secondary | ICD-10-CM

## 2018-10-26 DIAGNOSIS — Z7901 Long term (current) use of anticoagulants: Secondary | ICD-10-CM | POA: Diagnosis not present

## 2018-10-26 MED ORDER — AMIODARONE HCL 200 MG PO TABS: 200.0000 mg | ORAL_TABLET | Freq: Every day | ORAL | 3 refills | Status: DC

## 2018-10-26 NOTE — Patient Instructions (Signed)
Medication Instructions:  Start Amiodarone 200 mg daily.  If you need a refill on your cardiac medications before your next appointment, please call your pharmacy.   Lab work: CMET, MAG, TSH, CBC, LIPID before follow up in 4 weeks. If you have labs (blood work) drawn today and your tests are completely normal, you will receive your results only by: Marland Kitchen MyChart Message (if you have MyChart) OR . A paper copy in the mail If you have any lab test that is abnormal or we need to change your treatment, we will call you to review the results.  Follow-Up: At [facility], you and your health needs are our priority.  As part of our continuing mission to provide you with exceptional heart care, we have created designated Provider Care Teams.  These Care Teams include your primary Cardiologist (physician) and Advanced Practice Providers (APPs -  Physician Assistants and Nurse Practitioners) who all work together to provide you with the care you need, when you need it. You will need a follow up appointment in 4 weeks. (11/21/2018 @ 11:40) please schedule this. You may see Shelva Majestic, MD or one of the following Advanced Practice Providers on your designated Care Team: [place], [place] . Fabian Sharp, PA-C

## 2018-10-26 NOTE — Progress Notes (Signed)
Patient ID: Katelyn [name], female   DOB: [birth year], 78 y.o.   MRN: [id number]    PCP: Dr. Newt Minion  HPI: Katelyn [name] is a 78 y.o. female who presents to the office for a 4 month follow-up cardiology evaluation  Katelyn [name] has known CAD and underwent initial cardiac catheterization in  April 2012 which revealed mid RCA occlusion with collaterals, 50% LAD stenosis, 30% circumflex stenosis, and 40% marginal stenosis. 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 Trula Slade. She has a history of hypertension with grade 2 diastolic dysfunction, hyperlipidemia, and has developed moderately severe aortic valve stenosis. An echo Doppler study in May 2013 showed a mean transvalvular aortic gradient of 34 with a maximum of 56. In December 2013, her mean gradient was 32 and peak 56 with a valve area of 0.84. An echo Doppler study on 12/06/2012 continued to show normal systolic function with an ejection fraction of 60-65%. She had grade 1 diastolic dysfunction. Peak gradients were  61 mm and mean gradient was 38 mm.  The aortic valve area 0.8-0.9 cm. She had mild to moderate aortic insufficiency. Had mildly thickened mitral valve leaflets with mild MR. The left atrium was mildly dilated. She had mild TR. Pulmonary pressure was mildly increased at 33 mm.  A follow-up echo Doppler study on 02/12/2014  Showed normal EF at 55-60%. Her aortic valve was severely calcified and stenosis was felt to be severe with a mean gradient of 42, a peak gradient of 64 which had slightly increaed from 38 and 61 mm, respectively.  She began to develop exertional chest discomfort with associated dyspnea and fatigue.  She underwent repeat cardiac catheterization on 07/25/2014 which showed a chronically occluded RCA with left-to-right collaterals, a 70% diagonal stenosis, 50% ostial circumflex  stenosis and 40% AV groove circumflex stenosis.  Her aortic stenosis at further progressed such that she had a mean gradient of 42 mm.  She was felt to be a poor candidate for conventional aortic valve replacement and on 08/31/2014 underwent successful  TAVR with placement of a 26 mm Edward Sapien 3 valve from the right femoral approach by Drs. Roxy Manns and [place].  She developed postoperative atrial fibrillation for which she was transiently treated with amiodarone and when seen for office follow-up she was back in sinus rhythm.  She has noticed improvement in energy.  She is unaware of recurrent atrial fibrillation.  She denies recurrent chest tightness.  She at times is fatigued.  A subsequent echo on 10/01/2014 showed an EF of 60-65% with a mean aortic gradient of 16 mm and peak gradient 26 with trivial aortic insufficiency.    A follow-up echo Doppler study on 09/09/2015 showed an EF of 60-65% and grade 2 diastolic dysfunction.  There were no wall motion abnormalities.  Her 26 mm Edward's Sapien 3 valve was well-seated from her TAVR procedure.  The mean valve gradient was 16 mm.  There was no evidence for perivalvular leak.  There was mitral annular calcification with mild MR, mild LA dilatation and mild dilation of her right ventricle.  There is mild coronary hypertension with a PA pressure 33 mm.    When I  saw her in October 2017, her dose of valsartan had been reduced prior to the office visit by Dr. Karie Kirks.  At that time, she was having more frequent palpitations and had trigeminy on her ECG.  With her being on reduce valsartan.  I recommended further titration of her beta blocker and increased her Toprol to 50 mg daily.    When I saw her in December 2017 her blood pressure was significantly elevated.  Her resting pulse was 57.  She was no longer taking valsartan and I added amlodipine 5 mg for more optimal blood pressure control.  She  underwent lower extremity Doppler studies on 03/16/2016 by  [facility] which revealed normal ABIs bilaterally. When I saw her in f/u on 03/31/16 BP was improved but was in atrial fibrillation which was new.  She has continued to be on warfarin anticoagulation and her INR was therapeutic.  I further titrated Toprol-XL to 75 mg for 5 days and recommended that she increase it to 100 mg depending upon her heart rate response.  She did take 100 mg, she did experience some dizziness and as result, self reduced her back to 75 mg daily.  She is unaware of her rhythm being irregular.  In March 2018 her blood pressure was stable.  On 09/09/2016, she underwent a one-year follow-up echo Doppler study.  This showed mild LVH with normal systolic function.  There was grade 1 diastolic dysfunction.  The TAVR bioprosthesis was functioning well.  She had mild LA dilation and mild pulmonary hypertension at 40 mm.  She recently underwent lab work in May by Dr. Karie Kirks.  After she left the office.  I was able to obtain these results.  This showed a cholesterol of 128 and LDL of 54, HDL 49, and triglycerides of 168.  Her creatinine was 1.0.  Hemoglobin 15.7, hematocrit 45.8.  She had recently been notified by her pharmacy that the valsartan that she was taken was recently recalled.  When I last saw her, she had stopped taking the valsartan.  I changed her to irbesartan 150 mg.  She was seen by Dr. Meda Coffee, for primary care in [place] in early February 2019. Her INR was therapeutic at 2.6.  She was on amlodipine 5 mg, furosemide as needed, irbesartan 150 mg, and Toprol 25 mg in addition to rosuvastatin 20 mg and warfarin.  Lab from 04/24/17  showed a creatinine of 1.01.  TSH was pending.  Hemoglobin 15, hematocrit 43, but platelets were decreased at 1 25,000.  Total cholesterol is 124, HDL 46, triglycerides 214, and LDL 50.  Vitamin D was very low at 7.  When I saw her in February 2019, her ECG revealed that she was in atrial fibrillation for which she was asymptomatic.  At that time, I  titrated metoprolol succinate to 100 mg daily.  I also started her on 50,000 units of vitamin D weekly for at least 3 months.  She underwent a follow-up echo Doppler study on for 05/21/2017 which showed an EF of 60-65% with grade 2 diastolic dysfunction.  HerTAVR bioprosthesis was well-seated and functioned well.  There was no aortic insufficiency. Her left atrium was severely dilated.  Estimated PA pressure was 34 mm.   I saw her in March 2019 at which time she was maintaining sinus rhythm and ECG showed sinus bradycardia at 56 bpm with left bundle branch block.  She underwent a carotid study in May 2019 which showed mild carotid disease with 40 to 50% stenosis in the right internal carotid and 1 at 39% in the left.  She had normal subclavian and vertebral flow.  She over the past year, she was seen in September 2019 by Fabian Sharp, PA and was noting  some intermittent leg swelling for which she was taking Lasix as needed approximately 2-3 times per week and also using support stockings.    I saw her on May 09, 2018 presently, at which time she had noticed a rare episode of heart rate irregularity.  She denied any chest pain or shortness of breath.  She continued to be on amlodipine 5 mg,irbesartan150 mg,furosemide 20 mg for which he takes it 2-3 times per week, Toprol-XL 100 mg for hypertension and heart rate. She was on warfarin anticoagulation. GERD has been controlled with pantoprazole.  Her ECG in the office confirmed that she was back in atrial fibrillation.  Her ventricular rate was 87.  During that evaluation I discontinued amlodipine and substituted this with Cardizem CD 180 mg.  Laboratory was obtained.  She underwent a one-year follow-up echo Doppler study  on May 19, 2018.  At the time of her echo evaluation she was still in atrial fibrillation.  However, LV function remained normal.  Her TAVR valve was stable and actually gradients appeared slightly less than previously.  She was last  evaluated by me in April 2020 and a telemedicine visit.  She believes her heart rhythm has stabilized.  Obviously she has not had a follow-up EKG.  Her pulse was running fairly regularly in the 60s.  She does note almost daily ankle swelling and she has increased her furosemide to 20 mg daily.  She continued to use support stockings.  She denied chest pain,recurrent palpitations, presyncope or syncope.    Over the past several months, she has felt well.  She has had purposeful weight loss of approximately 20 pounds.  She has continued to be on diltiazem 180 mg, irbesartan 150 mg, Toprol-XL 100 mg for blood pressure and rate control.  She has been taking furosemide as needed 2 times per week 20 mg.  She is on rosuvastatin for hyperlipidemia.  She continues to be on warfarin for anticoagulation.  She presents for evaluation.   Past Medical History:  Diagnosis Date   Allergy    rhinitis   Aortic stenosis, severe    Aortic stenosis, severe    Arthritis    spine and various joints   Cataract    Childhood asthma    Chronic bronchitis (HCC)        Claudication (HCC)    Coronary artery disease    a. 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   26 mm Edwards Sapien 3 transcatheter heart valve placed via open right transfemoral approach   Seroma, postoperative    Left Groin   Severe aortic valve stenosis 08/31/2014   Stroke ([place]) 1982 X 2   "a little bit weaker on the left side since" (08/29/2014)    Past Surgical History:  Procedure Laterality Date   AORTO-FEMORAL BYPASS  GRAFT Bilateral 06/27/10   BASAL CELL CARCINOMA EXCISION Left    face   CARDIAC CATHETERIZATION N/A 07/25/2014   Procedure: Right/Left Heart Cath and Coronary Angiography;  Surgeon: Troy Sine, MD;  Location: [facility];  Service: Cardiovascular;  Laterality: N/A;   CATARACT EXTRACTION, BILATERAL Bilateral 09/29/2016   COLONOSCOPY  2008   Dr. Laural Golden: normal.    DILATION AND CURETTAGE OF UTERUS  "2 or 3"   ESOPHAGOGASTRODUODENOSCOPY N/A 08/18/2017   Procedure: ESOPHAGOGASTRODUODENOSCOPY (EGD);  Surgeon: Daneil Dolin, MD;  [facility];  Service: Endoscopy;  Laterality: N/A;  2:00pm   MALONEY DILATION N/A 08/18/2017   Procedure: Venia Minks DILATION;  Surgeon: Daneil Dolin, MD;  Location: [facility];  Service: Endoscopy;  Laterality: N/A;   MULTIPLE TOOTH EXTRACTIONS     PR VEIN BYPASS GRAFT,AORTO-FEM-POP  06/12/10   TEE WITHOUT CARDIOVERSION N/A 08/31/2014   Procedure: TRANSESOPHAGEAL ECHOCARDIOGRAM (TEE);  Surgeon: Rexene Alberts, MD;  Location: [facility];  Service: Open Heart Surgery;  Laterality: N/A;   TRANSCATHETER AORTIC VALVE REPLACEMENT, TRANSFEMORAL N/A 08/31/2014   Procedure: TRANSCATHETER AORTIC VALVE REPLACEMENT, TRANSFEMORAL approach;  Surgeon: Rexene Alberts, MD;  Location: [facility];  Service: Open Heart Surgery;  Laterality: N/A;   TUBAL LIGATION  1970's   VAGINAL HYSTERECTOMY  1970's   Partial     Allergies  Allergen Reactions   Adhesive [Tape] Rash   Latex Rash   Tetanus Toxoids Rash    Current Outpatient Medications  Medication Sig Dispense Refill   desoximetasone (TOPICORT) 0.05 % cream Apply topically 2 (two) times daily. 30 g 0   diltiazem (CARDIZEM CD) 180 MG 24 hr capsule Take 1 capsule (180 mg total) by mouth daily. 90 capsule 3   estradiol (ESTRACE) 2 MG tablet TAKE 1 TABLET BY MOUTH ONCE DAILY 90 tablet 0   furosemide (LASIX) 20 MG tablet Take 20 mg by mouth daily as needed for fluid.      ipratropium (ATROVENT) 0.06 %  nasal spray Place 1 spray into both nostrils every 6 (six) hours as needed for rhinitis. 16 mL 5   irbesartan (AVAPRO) 150 MG tablet Take 1 tablet by mouth once daily 90 tablet 0   meloxicam (MOBIC) 15 MG tablet Take 15 mg by mouth daily.  11   nitroGLYCERIN (NITROSTAT) 0.4 MG SL tablet Place 1 tablet (0.4 mg total) under the tongue every 5 (five) minutes as needed for chest pain. 25 tablet 6   pantoprazole (PROTONIX) 40 MG tablet TAKE 1 TABLET BY MOUTH ONCE DAILY BEFORE SUPPER 90 tablet 1   potassium chloride (KLOR-CON) 10 MEQ CR tablet Take 10 mEq by mouth daily as needed (when taking Lasix).      rosuvastatin (CRESTOR) 20 MG tablet TAKE 1 TABLET BY MOUTH ONCE DAILY 90 tablet 0   THYROID PO Take by mouth.     Vitamin D, Ergocalciferol, (DRISDOL) 50000 units CAPS capsule TAKE 1 CAPSULE BY MOUTH ONCE A WEEK (Patient taking differently: TAKE 1 CAPSULE BY MOUTH ONCE A WEEK ON THURSDAY) 12 capsule 0   warfarin (COUMADIN) 3 MG tablet Take 3 mg by mouth daily at 6 PM.      amiodarone (PACERONE) 200 MG tablet Take 1 tablet (200 mg total) by mouth daily. 90 tablet 3   metoprolol succinate (TOPROL-XL) 100 MG 24 hr tablet Take 1 tablet by mouth once daily 90 tablet 0   No current facility-administered medications for this visit.     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 strain: Not very hard   Food insecurity    Worry: Never true    Inability: Never true   Transportation needs    Medical: No    Non-medical: No  Tobacco Use   Smoking status: Former Smoker    Packs/day: 2.00    Years: 56.00    Pack years: 112.00    Types: Cigarettes    Start date: 03/02/1952    Quit date: 06/01/2010    Years since quitting: 8.4     Smokeless tobacco: Never Used   Tobacco comment: HAD WEANED DOWN TO 1/4 PK A DAY BEFORE D/C  Substance and Sexual Activity   Alcohol use: No    Alcohol/week: 0.0 standard drinks    Frequency: Never    Comment: 08/29/2014 "never drank much; quit in the 1990's"   Drug use: No   Sexual activity: Yes    Partners: Male  Lifestyle   Physical activity    Days per week: 0 days    Minutes per session: 0 min   Stress: Only a little  Relationships   Social connections    Talks on phone: More than three times a week    Gets together: More than three times a week    Attends religious service: Patient refused    Active member of club or organization: Patient refused    Attends meetings of clubs or organizations: Never    Relationship status: Married   Intimate partner violence    Fear of current or ex partner: No    Emotionally abused: No    Physically abused: No    Forced sexual activity: No  Other Topics Concern   Not on file  Social History Narrative   Married. Has one daughter from first marriage, second husband has three daughters.    Daughter livers here.      Mortimer Fries has 3 daughters   Patient has one daughter, 3 grands   Likes to crochet   Works in [employer].     Family History  Problem Relation Age of Onset   Heart disease Mother 61       died young   Varicose Veins Mother    Stroke Father    Hypertension Father    Heart disease Father 18       Aneurysm   Heart attack Father    Arthritis Brother    Stroke Brother    Diabetes Daughter    Arthritis Daughter    Allergic rhinitis Neg Hx    Angioedema Neg Hx    Asthma Neg Hx    Atopy Neg Hx    Eczema Neg Hx    Immunodeficiency Neg Hx    Urticaria Neg Hx    Social history is notable that she is married has one child. She quit tobacco in April 2012 after a long-standing tobacco history having started smoking at age 18.   ROS General: Negative; No fevers, chills, or night sweats;   HEENT: Negative; No changes in vision or hearing, sinus congestion, difficulty swallowing Pulmonary: Negative; No cough, wheezing, shortness of breath, hemoptysis Cardiovascular: See history of present illness.   GI: Negative; No nausea, vomiting, diarrhea, or abdominal pain GU: Negative; No dysuria, hematuria, or difficulty voiding Musculoskeletal: Negative; no myalgias, joint pain, or weakness Hematologic/Oncology: Negative; no easy bruising, bleeding Endocrine: Negative; no heat/cold intolerance; no diabetes Neuro: Negative; no changes in balance, headaches Skin: Negative; No rashes or skin lesions  Psychiatric: Negative; No behavioral problems, depression Sleep: Negative; No snoring, daytime sleepiness, hypersomnolence, bruxism, restless legs, hypnogognic hallucinations, no cataplexy Other comprehensive 14 point system review is negative.   PE BP (!) 153/82    Pulse 67    Ht '5\' 10"'  (1.778 m)    Wt 178 lb 12.8 oz (81.1 kg)    SpO2 96%    BMI 25.66 kg/m    Repeat blood pressure by me 134/82 supine and 132/80 standing  Wt Readings from Last 3 Encounters:  10/26/18 178 lb 12.8 oz (81.1 kg)  06/15/18 189 lb (85.7 kg)  05/12/18 189 lb 3.2 oz (85.8 kg)     Physical Exam BP (!) 153/82    Pulse 67    Ht '5\' 10"'  (1.778 m)    Wt 178 lb 12.8 oz (81.1 kg)    SpO2 96%    BMI 25.66 kg/m  General: Alert, oriented, no distress.  Skin: normal turgor, no rashes, warm and dry HEENT: Normocephalic, atraumatic. Pupils equal round and reactive to light; sclera anicteric; extraocular muscles intact;  Nose without nasal septal hypertrophy Mouth/Parynx benign; Mallinpatti scale 3 Neck: No JVD, no carotid bruits; normal carotid upstroke Lungs: clear to ausculatation and percussion; no wheezing or rales Chest wall: without tenderness to palpitation Heart: PMI not displaced, irregularly irregular with ventricular rate in the 60s, s1 s2 normal, 1/6 systolic murmur, no diastolic murmur, no rubs, gallops,  thrills, or heaves Abdomen: soft, nontender; no hepatosplenomehaly, BS+; abdominal aorta nontender and not dilated by palpation. Back: no CVA tenderness Pulses 2+ Musculoskeletal: full range of motion, normal strength, no joint deformities Extremities: no clubbing cyanosis or edema, Homan's sign negative  Neurologic: grossly nonfocal; Cranial nerves grossly wnl Psychologic: Normal mood and affect   ECG (independently read by me): Atrial Fibrillation at 67; LBBB with repolarization  March 2020 ECG (independently read by me): Atrial fibrillation with ventricular rate at 87.  Left bundle branch block with repolarization changes.  March 2019 ECG (independently read by me): sinus bradycardia 56 bpm.  Left bundle branch block.  PR interval 198 ms.  04/14/2017 ECG (independently read by me): Atrial fibrillation at 77 bpm left bundle branch block.  Left axis deviation.  QTc interval 520 ms  August 2018 ECG (independently read by me): Sinus bradycardia with occasional PACs.  Left axis deviation.  Borderline first-degree AV block with a PR interval of 22 ms.  Left bundle-branch block  March 2018 ECG (independently read by me): Sinus bradycardia 52 bpm.  First-degree AV block with a PR interval of 210 ms.  Left bundle branch block with repolarization changes.  PE the C.  January 2018 ECG (independently read by me): Atrial fibrillation with a rate at 91.  Left bundle-branch block.  December 2017 ECG (independently read by me): Sinus bradycardia 57 bpm.  Left bundle branch block with repolarization changes.  October 2017 ECG (independently read by me): Sinus rhythm at 74 bpm with frequent PVCs in a trigeminal pattern.  March 2017 ECG (independently read by me): Sinus bradycardia 54 bpm.  Left bundle branch block with repolarization changes.  November 2016 ECG (independently read by me): Sinus bradycardia 55 bpm with occasional premature atrial complex.  Left bundle-branch block.  February 2016 ECG  (independently read by me):  Sinus bradycardia 57 bpm.  Left bundle branch block with repolarization changes..  No ectopy.  December 2015 ECG (independently read by me): Sinus bradycardia 58 bpm.  Mild left atrial enlargement.  Left axis deviation.  Left bundle  branch block with repolarization changes.  October 2014 ECG: Sinus bradycardia with left axis deviation. Left bundle branch block with repolarization changes  LAB BMP Latest Ref Rng & Units 05/17/2018 04/13/2017 10/22/2016  Glucose 65 - 99 mg/dL 96 90 117(H)  BUN 8 - 27 mg/dL '17 20 20  ' Creatinine 0.57 - 1.00 mg/dL 1.06(H) 1.01(H) 1.18(H)  BUN/Creat Ratio 12 - '28 16 20 ' -  Sodium 134 - 144 mmol/L 139 138 140  Potassium 3.5 - 5.2 mmol/L 5.3(H) 4.8 4.8  Chloride 96 - 106 mmol/L 103 105 105  CO2 20 - 29 mmol/L '24 29 25  ' Calcium 8.7 - 10.3 mg/dL 9.0 8.8 9.0   Hepatic Function Latest Ref Rng & Units 05/17/2018 04/13/2017 10/22/2016  Total Protein 6.0 - 8.5 g/dL 6.2 6.6 6.0(L)  Albumin 3.7 - 4.7 g/dL 4.0 - 3.8  AST 0 - 40 IU/L '19 16 21  ' ALT 0 - 32 IU/L '14 12 16  ' Alk Phosphatase 39 - 117 IU/L 76 - 79  Total Bilirubin 0.0 - 1.2 mg/dL 1.0 0.6 0.5   CBC Latest Ref Rng & Units 05/17/2018 04/13/2017 03/03/2017  WBC 3.4 - 10.8 x10E3/uL 7.5 5.7 6.9  Hemoglobin 11.1 - 15.9 g/dL 15.4 15.0 14.6  Hematocrit 34.0 - 46.6 % 47.0(H) 43.8 46.2(H)  Platelets 150 - 450 x10E3/uL 163 125(L) 130(L)   Lab Results  Component Value Date   MCV 92 05/17/2018   MCV 89.9 04/13/2017   MCV 96.3 03/03/2017   Lab Results  Component Value Date   TSH 2.500 05/17/2018   Lipid Panel     Component Value Date/Time   CHOL 115 05/17/2018 0913   TRIG 107 05/17/2018 0913   HDL 48 05/17/2018 0913   CHOLHDL 2.4 05/17/2018 0913   CHOLHDL 2.7 04/13/2017 1439   VLDL 40 (H) 01/16/2015 1028   LDLCALC 46 05/17/2018 0913   LDLCALC 50 04/13/2017 1439     RADIOLOGY: No results found.  IMPRESSION:  1. PAF (paroxysmal atrial fibrillation) ([place])   2. Warfarin  anticoagulation   3. 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 Hyperlipidemia with target LDL less than 70   5. Bilateral carotid artery stenosis     ASSESSMENT AND PLAN: Katelyn [name] is a 78 year old female who has documented CAD and PVD who developed progressive aortic valve stenosis and underwent successful TAVR procedure on 08/31/2014 with placement of a 26 mm Edward Sapien 3 Valve from the right femoral approach.  She later  developed stage II hypertension resulting in increased medical regimen.   She developed postoperative atrial fibrillation and previouslyconverted back to sinus rhythm on her increased beta blocker dose.  She is on warfarin for anticoagulation and denies bleeding.  An echo Doppler study in July 2018 continued to show normal systolic function.  Her bioprosthetic TAVR valve was functioning well without mobility restriction.  The mean gradient was 10 with a peak gradient of 21.  When I saw her on 04/14/2017, her ECG demonstrated recurrent atrial fibrillation of questionable duration.  She was anticoagulated on warfarin.  I further titrated metoprolol succinate up to 100 mg daily.  Her  echo Doppler study in March 2019 revealed normal systolic function, and grade 2 diastolic dysfunction.  Her bioprosthetic valve was functioning well.  The mean gradient was 12 and peak gradient 25.  There was no aortic insufficiency.  PA peak pressure was 34 mm.  When seen in March 2020 Katelyn [name] again had developed recurrent atrial fibrillation.  An echo  Doppler study was done on May 19, 2018 which showed mild concentric LVH and EF 60 to 65%.  She was in atrial fibrillation at that time.  Her 26 mm Edwards sapient bioprosthetic aortic valve was present and functioning normally.  She now has been in atrial fibrillation on 3 different occurrences in 2018, 2019 and 2020.  Presently, I have recommended addition of amiodarone 200 mg daily.  In 2 weeks laboratory will be obtained including  a chemistry profile magnesium TSH lipid studies and CBC.  Blood pressure today is stable without orthostatic symptoms on her current regimen.  Reviewed her most recent carotid duplex study from June 2020 which showed mild internal carotid plaque with 40 to 50% stenosis on the right and less than 39% on the left.  She had normal vertebral and subclavian flow bilaterally.  She does not have significant edema on her current regimen of furosemide 20 mg twice a week.  She continues to be on rosuvastatin for hyperlipidemia.  She is anticoagulated on warfarin 3 mg and her INR check at home on August 20 was therapeutic at 2.3.  I will see her back in the office in 4 weeks for reevaluation and if at that time she is still in atrial fibrillation further titration of amiodarone will be done and plans will be made for DC cardioversion  Time spent: 25-minute  Troy Sine, MD, [facility]  11/01/2018 6:17 PM

## 2018-11-01 ENCOUNTER — Other Ambulatory Visit: Payer: Self-pay | Admitting: Cardiovascular Disease

## 2018-11-01 ENCOUNTER — Encounter: Payer: Self-pay | Admitting: Cardiovascular Disease

## 2018-11-02 ENCOUNTER — Ambulatory Visit (INDEPENDENT_AMBULATORY_CARE_PROVIDER_SITE_OTHER): Payer: Self-pay

## 2018-11-03 ENCOUNTER — Encounter: Payer: Self-pay | Admitting: Nurse Practitioner

## 2018-11-03 LAB — POCT INR: INR: 2 (ref 2.0–3.0)

## 2018-11-10 ENCOUNTER — Other Ambulatory Visit (INDEPENDENT_AMBULATORY_CARE_PROVIDER_SITE_OTHER): Payer: Self-pay

## 2018-11-10 ENCOUNTER — Encounter: Payer: Self-pay | Admitting: Nurse Practitioner

## 2018-11-10 DIAGNOSIS — Z7901 Long term (current) use of anticoagulants: Secondary | ICD-10-CM | POA: Diagnosis not present

## 2018-11-10 LAB — POCT INR: INR: 2.4 (ref 2.0–3.0)

## 2018-11-10 MED ORDER — THYROID 60 MG PO TABS: 60.0000 mg | ORAL_TABLET | Freq: Every day | ORAL | 3 refills | Status: DC

## 2018-11-17 ENCOUNTER — Telehealth (INDEPENDENT_AMBULATORY_CARE_PROVIDER_SITE_OTHER): Payer: Self-pay

## 2018-11-17 ENCOUNTER — Encounter: Payer: Self-pay | Admitting: Nurse Practitioner

## 2018-11-17 LAB — POCT INR: INR: 2.3 (ref 2.0–3.0)

## 2018-11-17 NOTE — Telephone Encounter (Signed)
She should continue with the same dose of warfarin based on this INR.

## 2018-11-21 ENCOUNTER — Ambulatory Visit: Payer: Medicare Other | Admitting: Cardiovascular Disease

## 2018-11-21 DIAGNOSIS — H40013 Open angle with borderline findings, low risk, bilateral: Secondary | ICD-10-CM | POA: Diagnosis not present

## 2018-11-21 DIAGNOSIS — H26493 Other secondary cataract, bilateral: Secondary | ICD-10-CM | POA: Diagnosis not present

## 2018-11-21 DIAGNOSIS — H35363 Drusen (degenerative) of macula, bilateral: Secondary | ICD-10-CM | POA: Diagnosis not present

## 2018-11-21 DIAGNOSIS — H26491 Other secondary cataract, right eye: Secondary | ICD-10-CM | POA: Diagnosis not present

## 2018-11-21 DIAGNOSIS — I480 Paroxysmal atrial fibrillation: Secondary | ICD-10-CM | POA: Diagnosis not present

## 2018-11-21 DIAGNOSIS — H35033 Hypertensive retinopathy, bilateral: Secondary | ICD-10-CM | POA: Diagnosis not present

## 2018-11-22 LAB — COMPREHENSIVE METABOLIC PANEL
ALT: 20 IU/L (ref 0–32)
AST: 23 IU/L (ref 0–40)
Albumin/Globulin Ratio: 1.7 (ref 1.2–2.2)
Albumin: 4.1 g/dL (ref 3.7–4.7)
Alkaline Phosphatase: 101 IU/L (ref 39–117)
BUN/Creatinine Ratio: 22 (ref 12–28)
BUN: 31 mg/dL — ABNORMAL HIGH (ref 8–27)
Bilirubin Total: 0.6 mg/dL (ref 0.0–1.2)
CO2: 25 mmol/L (ref 20–29)
Calcium: 9.1 mg/dL (ref 8.7–10.3)
Chloride: 103 mmol/L (ref 96–106)
Creatinine, Ser: 1.38 mg/dL — ABNORMAL HIGH (ref 0.57–1.00)
GFR calc Af Amer: 43 mL/min/{1.73_m2} — ABNORMAL LOW (ref >59)
GFR calc non Af Amer: 37 mL/min/{1.73_m2} — ABNORMAL LOW (ref >59)
Globulin, Total: 2.4 g/dL (ref 1.5–4.5)
Glucose: 102 mg/dL — ABNORMAL HIGH (ref 65–99)
Potassium: 4.8 mmol/L (ref 3.5–5.2)
Sodium: 138 mmol/L (ref 134–144)
Total Protein: 6.5 g/dL (ref 6.0–8.5)

## 2018-11-22 LAB — MAGNESIUM: Magnesium: 1.9 mg/dL (ref 1.6–2.3)

## 2018-11-22 LAB — LIPID PANEL
Chol/HDL Ratio: 2.5 ratio (ref 0.0–4.4)
Cholesterol, Total: 126 mg/dL (ref 100–199)
HDL: 51 mg/dL (ref >39)
LDL Chol Calc (NIH): 54 mg/dL (ref 0–99)
Triglycerides: 116 mg/dL (ref 0–149)
VLDL Cholesterol Cal: 21 mg/dL (ref 5–40)

## 2018-11-22 LAB — TSH: TSH: 3.57 u[IU]/mL (ref 0.450–4.500)

## 2018-11-24 ENCOUNTER — Telehealth (INDEPENDENT_AMBULATORY_CARE_PROVIDER_SITE_OTHER): Payer: Self-pay

## 2018-11-24 DIAGNOSIS — Z7901 Long term (current) use of anticoagulants: Secondary | ICD-10-CM

## 2018-11-24 LAB — PROTIME-INR: INR: 3 — AB (ref 0.9–1.1)

## 2018-11-24 NOTE — Telephone Encounter (Signed)
Incoming fax for [facility] for results is 3.0. Copy of fax to be signed in yellow folder.

## 2018-11-24 NOTE — Telephone Encounter (Signed)
Yes, continue the same dose.  Thanks.

## 2018-11-30 ENCOUNTER — Other Ambulatory Visit (INDEPENDENT_AMBULATORY_CARE_PROVIDER_SITE_OTHER): Payer: Self-pay | Admitting: Internal Medicine

## 2018-12-01 ENCOUNTER — Telehealth (INDEPENDENT_AMBULATORY_CARE_PROVIDER_SITE_OTHER): Payer: Self-pay

## 2018-12-01 LAB — POCT INR

## 2018-12-01 NOTE — Telephone Encounter (Signed)
Continue with the same dose of warfarin/Coumadin.

## 2018-12-01 NOTE — Telephone Encounter (Signed)
Pt was called and given the verbal instructions to continue warfarin dose unitl next week INR check-up.

## 2018-12-05 ENCOUNTER — Other Ambulatory Visit: Payer: Self-pay

## 2018-12-05 ENCOUNTER — Ambulatory Visit (INDEPENDENT_AMBULATORY_CARE_PROVIDER_SITE_OTHER): Payer: Medicare Other | Admitting: Internal Medicine

## 2018-12-05 ENCOUNTER — Encounter (INDEPENDENT_AMBULATORY_CARE_PROVIDER_SITE_OTHER): Payer: Self-pay | Admitting: Internal Medicine

## 2018-12-05 VITALS — BP 130/70 | HR 64 | Ht 70.0 in | Wt 181.0 lb

## 2018-12-05 DIAGNOSIS — I6523 Occlusion and stenosis of bilateral carotid arteries: Secondary | ICD-10-CM | POA: Diagnosis not present

## 2018-12-05 DIAGNOSIS — Z7901 Long term (current) use of anticoagulants: Secondary | ICD-10-CM | POA: Diagnosis not present

## 2018-12-05 DIAGNOSIS — I10 Essential (primary) hypertension: Secondary | ICD-10-CM

## 2018-12-05 DIAGNOSIS — Z23 Encounter for immunization: Secondary | ICD-10-CM

## 2018-12-05 DIAGNOSIS — E785 Hyperlipidemia, unspecified: Secondary | ICD-10-CM | POA: Diagnosis not present

## 2018-12-05 DIAGNOSIS — I679 Cerebrovascular disease, unspecified: Secondary | ICD-10-CM | POA: Diagnosis not present

## 2018-12-05 DIAGNOSIS — E559 Vitamin D deficiency, unspecified: Secondary | ICD-10-CM

## 2018-12-05 DIAGNOSIS — Z952 Presence of prosthetic heart valve: Secondary | ICD-10-CM | POA: Diagnosis not present

## 2018-12-05 DIAGNOSIS — Z79890 Hormone replacement therapy (postmenopausal): Secondary | ICD-10-CM | POA: Diagnosis not present

## 2018-12-05 HISTORY — DX: Cerebrovascular disease, unspecified: I67.9

## 2018-12-05 NOTE — Progress Notes (Signed)
Wellness Office Visit  Subjective:  Patient ID: Katelyn [name], female    DOB: 11/15/1940  Age: 78 y.o. MRN: [id number]  CC: This lady comes in for follow-up of her multiple medical problems including hypertension, hyperlipidemia, cerebrovascular disease, vitamin D deficiency.  She is also on identical hormone therapy for postmenopausal state. HPI  She is doing well.  She continues with antihypertensive medications without problems.  She denies any chest pain, dyspnea, palpitations or limb weakness. She continues on statin therapy without myalgias.  She did have lipid panel checked by her cardiologist and this was in a good range. Her kidney function has been worse recently and she tells me that she has been drinking more water. She also takes warfarin for previous history of paroxysmal atrial fibrillation and also she has a aortic metal valve I believe. In the past she has had a stroke and this also is the reason why she takes anticoagulation therapy. Past Medical History:  Diagnosis Date  . Allergy    rhinitis  . Aortic stenosis, severe   . Aortic stenosis, severe   . Arthritis    spine and various joints  . Cataract   . Cerebrovascular disease 12/05/2018  . Childhood asthma   . Chronic bronchitis ([place])       . Claudication ([place])   . Coronary artery disease    a. April 2012 which revealed mid RCA occlusion with collaterals, 50% LAD stenosis, 30% circumflex stenosis, and 40% marginal stenosis  . GERD (gastroesophageal reflux disease)   . Heart murmur   . Hyperlipidemia   . Hypertension   . LUMBAR RADICULOPATHY, RIGHT 12/21/2007   Qualifier: Diagnosis of  By: Aline Brochure MD, Dorothyann Peng    . LUMBAR RADICULOPATHY, RIGHT   . Peripheral vascular disease ([place])   . 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 Seroma, postoperative    Left Groin  . Severe aortic valve  stenosis 08/31/2014  . Stroke [facility]) 1982 X 2   "a little bit weaker on the left side since" (08/29/2014)      Family History  Problem Relation Age of Onset  . Heart disease Mother 68       died young  . Varicose Veins Mother   . Stroke Father   . Hypertension Father   . Heart disease Father 24       Aneurysm  . Heart attack Father   . Arthritis Brother   . Stroke Brother   . Diabetes Daughter   . Arthritis Daughter   . Allergic rhinitis Neg Hx   . Angioedema Neg Hx   . Asthma Neg Hx   . Atopy Neg Hx   . Eczema Neg Hx   . Immunodeficiency Neg Hx   . Urticaria Neg Hx     Social History   Social History Narrative   Married for 36 years.Lives with husband. Retired,used to work at [employer].      Mortimer Fries has 3 daughters   Patient has one daughter, 3 grands   Likes to crochet   Works in [employer].      Current Meds  Medication Sig  . Cholecalciferol (VITAMIN D3) 25 MCG (1000 UT) CAPS Take 2 capsules by mouth daily.  Marland Kitchen diltiazem (CARDIZEM CD) 180 MG 24 hr capsule Take 1 capsule (180 mg total) by mouth daily.  Marland Kitchen estradiol (ESTRACE) 2 MG tablet TAKE 1 TABLET BY  MOUTH ONCE DAILY  . furosemide (LASIX) 20 MG tablet Take 20 mg by mouth daily as needed for fluid.   Marland Kitchen ipratropium (ATROVENT) 0.06 % nasal spray Place 1 spray into both nostrils every 6 (six) hours as needed for rhinitis.  Marland Kitchen irbesartan (AVAPRO) 150 MG tablet Take 1 tablet by mouth once daily  . meloxicam (MOBIC) 15 MG tablet Take 1 tablet by mouth once daily  . metoprolol succinate (TOPROL-XL) 100 MG 24 hr tablet Take 1 tablet by mouth once daily  . nitroGLYCERIN (NITROSTAT) 0.4 MG SL tablet Place 1 tablet (0.4 mg total) under the tongue every 5 (five) minutes as needed for chest pain.  . pantoprazole (PROTONIX) 40 MG tablet TAKE 1 TABLET BY MOUTH ONCE DAILY BEFORE SUPPER  . potassium chloride (KLOR-CON) 10 MEQ CR tablet Take 10 mEq by mouth daily as needed (when taking Lasix).   . rosuvastatin (CRESTOR) 20 MG  tablet TAKE 1 TABLET BY MOUTH ONCE DAILY  . thyroid (NP THYROID) 60 MG tablet Take 1 tablet (60 mg total) by mouth daily before breakfast.  . warfarin (COUMADIN) 2.5 MG tablet Take 2.5 mg by mouth daily at 6 PM.      Nutrition  Variable but she says she eats healthy. Sleep  She says she sleeps a good 6 to 7 hours of uninterrupted sleep.  Exercise  She looks after her husband and she feels that she is quite active. Bio Identical Hormones  Estradiol is being used in this patient for multiple benefits based on several studies including protection against heart disease, cerebrovascular disease, osteoporosis, colon cancer, Alzheimer's disease, macular degeneration and cataracts. The patient has been counseled regarding benefits and side effects and modes of administration. The patient is agreeable that this therapy is an integral to part of her wellness, quality of life and prevention of chronic disease.  Objective:   Today's Vitals: BP 130/70   Pulse 64   Ht 5\' 10"  (1.778 m)   Wt 181 lb (82.1 kg)   BMI 25.97 kg/m  Vitals with BMI 12/05/2018 10/26/2018 06/15/2018  Height 5\' 10"  5\' 10"  -  Weight 181 lbs 178 lbs 13 oz -  BMI 67.34 19.37 -  Systolic 902 409 735  Diastolic 70 82 62  Pulse 64 67 -     Physical Exam       Assessment   1. Essential hypertension   2. Hyperlipidemia with target LDL less than 70   3. S/P TAVR (transcatheter aortic valve replacement)   4. Chronic anticoagulation   5. Current long-term use of postmenopausal hormone replacement therapy   6. Cerebrovascular disease   7. Vitamin D deficiency disease       Tests ordered Orders Placed This Encounter  Procedures  . COMPLETE METABOLIC PANEL WITH GFR  . VITAMIN D 25 Hydroxy (Vit-D Deficiency, Fractures)  . T3, free  . TSH     Plan: 1. Her blood pressure is well controlled and she will continue with the same medications. 2. Hyperlipidemia is controlled on statin therapy and she does not have  any myalgias.  She will continue with this. 3. She is on chronic anticoagulation therapy and her last INR was in the therapeutic range so she will continue with the same dose. 4. She is tolerating estradiol and her last estradiol level was in a very good range over 100.  She will continue with this. 5. We will check vitamin D levels again today to see if we can optimize.  My understanding  was that she should be taking 10,000 units of vitamin D3 but she thinks she is taking only 2000. 6. I will see her back in about 3 months time for follow-up and further recommendations will depend on blood results from the test done above.  Today she was given high-dose influenza vaccination.     Doree Albee, MD

## 2018-12-06 ENCOUNTER — Other Ambulatory Visit (INDEPENDENT_AMBULATORY_CARE_PROVIDER_SITE_OTHER): Payer: Self-pay | Admitting: Internal Medicine

## 2018-12-06 LAB — COMPLETE METABOLIC PANEL WITH GFR
AG Ratio: 1.6 (calc) (ref 1.0–2.5)
ALT: 14 U/L (ref 6–29)
AST: 18 U/L (ref 10–35)
Albumin: 3.8 g/dL (ref 3.6–5.1)
Alkaline phosphatase (APISO): 82 U/L (ref 37–153)
BUN/Creatinine Ratio: 19 (calc) (ref 6–22)
BUN: 25 mg/dL (ref 7–25)
CO2: 26 mmol/L (ref 20–32)
Calcium: 8.8 mg/dL (ref 8.6–10.4)
Chloride: 105 mmol/L (ref 98–110)
Creat: 1.34 mg/dL — ABNORMAL HIGH (ref 0.60–0.93)
GFR, Est African American: 44 mL/min/{1.73_m2} — ABNORMAL LOW (ref >=60)
GFR, Est Non African American: 38 mL/min/{1.73_m2} — ABNORMAL LOW (ref >=60)
Globulin: 2.4 g/dL (calc) (ref 1.9–3.7)
Glucose, Bld: 87 mg/dL (ref 65–99)
Potassium: 4.6 mmol/L (ref 3.5–5.3)
Sodium: 141 mmol/L (ref 135–146)
Total Bilirubin: 0.8 mg/dL (ref 0.2–1.2)
Total Protein: 6.2 g/dL (ref 6.1–8.1)

## 2018-12-06 LAB — VITAMIN D 25 HYDROXY (VIT D DEFICIENCY, FRACTURES): Vit D, 25-Hydroxy: 59 ng/mL (ref 30–100)

## 2018-12-06 LAB — TSH: TSH: 4.23 mIU/L (ref 0.40–4.50)

## 2018-12-06 LAB — T3, FREE: T3, Free: 3 pg/mL (ref 2.3–4.2)

## 2018-12-06 MED ORDER — THYROID 90 MG PO TABS: 90.0000 mg | ORAL_TABLET | Freq: Every day | ORAL | 3 refills | Status: DC

## 2018-12-08 DIAGNOSIS — H26492 Other secondary cataract, left eye: Secondary | ICD-10-CM | POA: Diagnosis not present

## 2018-12-08 DIAGNOSIS — Z7901 Long term (current) use of anticoagulants: Secondary | ICD-10-CM | POA: Diagnosis not present

## 2018-12-08 LAB — POCT INR: INR: 2.1 (ref 2.0–3.0)

## 2018-12-15 ENCOUNTER — Telehealth (INDEPENDENT_AMBULATORY_CARE_PROVIDER_SITE_OTHER): Payer: Self-pay

## 2018-12-15 NOTE — Telephone Encounter (Signed)
Pt MDInr fax was sent to office . Results is 2.6. Verbal instructions to call pt ; tell her no changes in the medications today. Resume same medications until next test next week.

## 2018-12-22 LAB — POCT INR: INR: 2.1 (ref 2.0–3.0)

## 2018-12-27 ENCOUNTER — Other Ambulatory Visit (INDEPENDENT_AMBULATORY_CARE_PROVIDER_SITE_OTHER): Payer: Self-pay | Admitting: Internal Medicine

## 2018-12-28 ENCOUNTER — Telehealth (INDEPENDENT_AMBULATORY_CARE_PROVIDER_SITE_OTHER): Payer: Self-pay | Admitting: Nurse Practitioner

## 2018-12-28 NOTE — Telephone Encounter (Signed)
This patient's INR level came back at 2.7.  This is within goal.  She was called in notified to continue her same dose of medication.  She tells me she understands.

## 2019-01-05 DIAGNOSIS — Z7901 Long term (current) use of anticoagulants: Secondary | ICD-10-CM | POA: Diagnosis not present

## 2019-01-05 LAB — POCT INR: INR: 2 (ref 2.0–3.0)

## 2019-01-07 ENCOUNTER — Other Ambulatory Visit (INDEPENDENT_AMBULATORY_CARE_PROVIDER_SITE_OTHER): Payer: Self-pay | Admitting: Internal Medicine

## 2019-01-12 LAB — POCT INR: INR: 2.6 (ref 2.0–3.0)

## 2019-01-17 ENCOUNTER — Other Ambulatory Visit: Payer: Self-pay

## 2019-01-17 ENCOUNTER — Encounter: Payer: Self-pay | Admitting: Internal Medicine

## 2019-01-17 ENCOUNTER — Ambulatory Visit (INDEPENDENT_AMBULATORY_CARE_PROVIDER_SITE_OTHER): Payer: Medicare Other | Admitting: Cardiovascular Disease

## 2019-01-17 VITALS — BP 159/61 | HR 53 | Temp 97.2°F | Ht 70.0 in | Wt 180.6 lb

## 2019-01-17 DIAGNOSIS — Z952 Presence of prosthetic heart valve: Secondary | ICD-10-CM | POA: Diagnosis not present

## 2019-01-17 DIAGNOSIS — E785 Hyperlipidemia, unspecified: Secondary | ICD-10-CM

## 2019-01-17 DIAGNOSIS — I6523 Occlusion and stenosis of bilateral carotid arteries: Secondary | ICD-10-CM

## 2019-01-17 DIAGNOSIS — Z7901 Long term (current) use of anticoagulants: Secondary | ICD-10-CM

## 2019-01-17 DIAGNOSIS — I480 Paroxysmal atrial fibrillation: Secondary | ICD-10-CM

## 2019-01-17 MED ORDER — FUROSEMIDE 20 MG PO TABS: 20.0000 mg | ORAL_TABLET | ORAL | 6 refills | Status: DC

## 2019-01-17 MED ORDER — AMIODARONE HCL 200 MG PO TABS: 200.0000 mg | ORAL_TABLET | Freq: Every day | ORAL | 3 refills | Status: DC

## 2019-01-17 MED ORDER — AMLODIPINE BESYLATE 2.5 MG PO TABS: 2.5000 mg | ORAL_TABLET | Freq: Every day | ORAL | 12 refills | Status: DC

## 2019-01-17 NOTE — Progress Notes (Signed)
Patient ID: Katelyn [name], female   DOB: 07/17/1940, 78 y.o.   MRN: [id number]    PCP: Dr. Newt Minion  HPI: Katelyn [name] is a 78 y.o. female who presents to the office for a 4 month follow-up cardiology evaluation  Katelyn [name] has known CAD and underwent initial cardiac catheterization in  April 2012 which revealed mid RCA occlusion with collaterals, 50% LAD stenosis, 30% circumflex stenosis, and 40% marginal stenosis. 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 Trula Slade. She has a history of hypertension with grade 2 diastolic dysfunction, hyperlipidemia, and has developed moderately severe aortic valve stenosis. An echo Doppler study in May 2013 showed a mean transvalvular aortic gradient of 34 with a maximum of 56. In December 2013, her mean gradient was 32 and peak 56 with a valve area of 0.84. An echo Doppler study on 12/06/2012 continued to show normal systolic function with an ejection fraction of 60-65%. She had grade 1 diastolic dysfunction. Peak gradients were  61 mm and mean gradient was 38 mm.  The aortic valve area 0.8-0.9 cm. She had mild to moderate aortic insufficiency. Had mildly thickened mitral valve leaflets with mild MR. The left atrium was mildly dilated. She had mild TR. Pulmonary pressure was mildly increased at 33 mm.  A follow-up echo Doppler study on 02/12/2014  Showed normal EF at 55-60%. Her aortic valve was severely calcified and stenosis was felt to be severe with a mean gradient of 42, a peak gradient of 64 which had slightly increaed from 38 and 61 mm, respectively.  She began to develop exertional chest discomfort with associated dyspnea and fatigue.  She underwent repeat cardiac catheterization on 07/25/2014 which showed a chronically occluded RCA with left-to-right collaterals, a 70% diagonal stenosis, 50% ostial circumflex  stenosis and 40% AV groove circumflex stenosis.  Her aortic stenosis at further progressed such that she had a mean gradient of 42 mm.  She was felt to be a poor candidate for conventional aortic valve replacement and on 08/31/2014 underwent successful  TAVR with placement of a 26 mm Edward Sapien 3 valve from the right femoral approach by Drs. Roxy Manns and [place].  She developed postoperative atrial fibrillation for which she was transiently treated with amiodarone and when seen for office follow-up she was back in sinus rhythm.  She has noticed improvement in energy.  She is unaware of recurrent atrial fibrillation.  She denies recurrent chest tightness.  She at times is fatigued.  A subsequent echo on 10/01/2014 showed an EF of 60-65% with a mean aortic gradient of 16 mm and peak gradient 26 with trivial aortic insufficiency.    A follow-up echo Doppler study on 09/09/2015 showed an EF of 60-65% and grade 2 diastolic dysfunction.  There were no wall motion abnormalities.  Her 26 mm Edward's Sapien 3 valve was well-seated from her TAVR procedure.  The mean valve gradient was 16 mm.  There was no evidence for perivalvular leak.  There was mitral annular calcification with mild MR, mild LA dilatation and mild dilation of her right ventricle.  There is mild coronary hypertension with a PA pressure 33 mm.    When I  saw her in October 2017, her dose of valsartan had been reduced prior to the office visit by Dr. Karie Kirks.  At that time, she was having more frequent palpitations and had trigeminy on her ECG.  With her being on reduce valsartan.  I recommended further titration of her beta blocker and increased her Toprol to 50 mg daily.    When I saw her in December 2017 her blood pressure was significantly elevated.  Her resting pulse was 57.  She was no longer taking valsartan and I added amlodipine 5 mg for more optimal blood pressure control.  She  underwent lower extremity Doppler studies on 03/16/2016 by  [place] which revealed normal ABIs bilaterally. When I saw her in f/u on 03/31/16 BP was improved but was in atrial fibrillation which was new.  She has continued to be on warfarin anticoagulation and her INR was therapeutic.  I further titrated Toprol-XL to 75 mg for 5 days and recommended that she increase it to 100 mg depending upon her heart rate response.  She did take 100 mg, she did experience some dizziness and as result, self reduced her back to 75 mg daily.  She is unaware of her rhythm being irregular.  In March 2018 her blood pressure was stable.  On 09/09/2016, she underwent a one-year follow-up echo Doppler study.  This showed mild LVH with normal systolic function.  There was grade 1 diastolic dysfunction.  The TAVR bioprosthesis was functioning well.  She had mild LA dilation and mild pulmonary hypertension at 40 mm.  She recently underwent lab work in May by Dr. Karie Kirks.  After she left the office.  I was able to obtain these results.  This showed a cholesterol of 128 and LDL of 54, HDL 49, and triglycerides of 168.  Her creatinine was 1.0.  Hemoglobin 15.7, hematocrit 45.8.  She had recently been notified by her pharmacy that the valsartan that she was taken was recently recalled.  When I last saw her, she had stopped taking the valsartan.  I changed her to irbesartan 150 mg.  She was seen by Dr. Meda Coffee, for primary care in [place] in early February 2019. Her INR was therapeutic at 2.6.  She was on amlodipine 5 mg, furosemide as needed, irbesartan 150 mg, and Toprol 25 mg in addition to rosuvastatin 20 mg and warfarin.  Lab from 04/24/17  showed a creatinine of 1.01.  TSH was pending.  Hemoglobin 15, hematocrit 43, but platelets were decreased at 1 25,000.  Total cholesterol is 124, HDL 46, triglycerides 214, and LDL 50.  Vitamin D was very low at 7.  When I saw her in February 2019, her ECG revealed that she was in atrial fibrillation for which she was asymptomatic.  At that time, I  titrated metoprolol succinate to 100 mg daily.  I also started her on 50,000 units of vitamin D weekly for at least 3 months.  She underwent a follow-up echo Doppler study on for 05/21/2017 which showed an EF of 60-65% with grade 2 diastolic dysfunction.  HerTAVR bioprosthesis was well-seated and functioned well.  There was no aortic insufficiency. Her left atrium was severely dilated.  Estimated PA pressure was 34 mm.   I saw her in March 2019 at which time she was maintaining sinus rhythm and ECG showed sinus bradycardia at 56 bpm with left bundle branch block.  She underwent a carotid study in May 2019 which showed mild carotid disease with 40 to 50% stenosis in the right internal carotid and 1 at 39% in the left.  She had normal subclavian and vertebral flow.  She over the past year, she was seen in September 2019 by Fabian Sharp, PA and was noting  some intermittent leg swelling for which she was taking Lasix as needed approximately 2-3 times per week and also using support stockings.    I saw her on May 09, 2018 presently, at which time she had noticed a rare episode of heart rate irregularity.  She denied any chest pain or shortness of breath.  She continued to be on amlodipine 5 mg,irbesartan150 mg,furosemide 20 mg for which he takes it 2-3 times per week, Toprol-XL 100 mg for hypertension and heart rate. She was on warfarin anticoagulation. GERD has been controlled with pantoprazole.  Her ECG in the office confirmed that she was back in atrial fibrillation.  Her ventricular rate was 87.  During that evaluation I discontinued amlodipine and substituted this with Cardizem CD 180 mg.  Laboratory was obtained.  She underwent a one-year follow-up echo Doppler study  on May 19, 2018.  At the time of her echo evaluation she was still in atrial fibrillation.  However, LV function remained normal.  Her TAVR valve was stable and actually gradients appeared slightly less than previously.  She was  evaluated by me in April 2020 and a telemedicine visit.  She believes her heart rhythm has stabilized.  Obviously she has not had a follow-up EKG.  Her pulse was running fairly regularly in the 60s.  She does note almost daily ankle swelling and she has increased her furosemide to 20 mg daily.  She continued to use support stockings.  She denied chest pain,recurrent palpitations, presyncope or syncope.    I last saw her for an in person evaluation in August 2020.  Over the past several months, she has felt well.  She has had purposeful weight loss of approximately 20 pounds.  She continued to be on diltiazem 180 mg, irbesartan 150 mg, Toprol-XL 100 mg for blood pressure and rate control.  She was taking furosemide as needed 2 times per week 20 mg.  She was on rosuvastatin for hyperlipidemia.  She continued to be on warfarin for anticoagulation.  During that evaluation, her ECG demonstrated that she was in atrial fibrillation with a ventricular rate at 67 bpm and had left bundle branch block with repolarization changes.  She was unaware of her atrial fibrillation.  During that evaluation I initiated amiodarone 200 mg daily.  Subsequent laboratory was obtained.  Since I saw her she has been divided by her primary physician Dr. Anastasio Champion.  She presently is unaware of any abnormal rhythm.  She denies dizziness.  She does note some trivial ankle swelling.  She denies presyncope or syncope.  Her INR last week was 2.6.  She presents for evaluation.  Past Medical History:  Diagnosis Date  . Allergy    rhinitis  . Aortic stenosis, severe   . Aortic stenosis, severe   . Arthritis    spine and various joints  . Cataract   . Cerebrovascular disease 12/05/2018  . Childhood asthma   . Chronic bronchitis ([place])       . Claudication ([place])   . Coronary artery disease    a. April 2012 which revealed mid RCA occlusion with collaterals, 50% LAD stenosis, 30% circumflex stenosis, and 40% marginal stenosis  . GERD  (gastroesophageal reflux disease)   . Heart murmur   . Hyperlipidemia   . Hypertension   . LUMBAR RADICULOPATHY, RIGHT 12/21/2007   Qualifier: Diagnosis of  By: Aline Brochure MD, Dorothyann Peng    . LUMBAR RADICULOPATHY, RIGHT   . Peripheral vascular disease ([place])   . 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 Seroma, postoperative    Left Groin  . Severe aortic valve stenosis 08/31/2014  . Stroke [facility]) 1982 X 2   "a little bit weaker on the left side since" (08/29/2014)    Past Surgical History:  Procedure Laterality Date  . AORTO-FEMORAL BYPASS GRAFT Bilateral 06/27/10  . BASAL CELL CARCINOMA EXCISION Left    face  . CARDIAC CATHETERIZATION N/A 07/25/2014   Procedure: Right/Left Heart Cath and Coronary Angiography;  Surgeon: Troy Sine, MD;  Location: [facility];  Service: Cardiovascular;  Laterality: N/A;  . CATARACT EXTRACTION, BILATERAL Bilateral 09/29/2016  . COLONOSCOPY  2008   Dr. Laural Golden: normal.   . DILATION AND CURETTAGE OF UTERUS  "2 or 3"  . ESOPHAGOGASTRODUODENOSCOPY N/A 08/18/2017   Procedure: ESOPHAGOGASTRODUODENOSCOPY (EGD);  Surgeon: Daneil Dolin, MD;  [facility];  Service: Endoscopy;  Laterality: N/A;  2:00pm  . MALONEY DILATION N/A 08/18/2017   Procedure: Venia Minks DILATION;  Surgeon: Daneil Dolin, MD;  Location: [facility];  Service: Endoscopy;  Laterality: N/A;  . MULTIPLE TOOTH EXTRACTIONS    . PR VEIN BYPASS GRAFT,AORTO-FEM-POP  06/12/10  . TEE WITHOUT CARDIOVERSION N/A 08/31/2014   Procedure: TRANSESOPHAGEAL ECHOCARDIOGRAM (TEE);  Surgeon: Rexene Alberts, MD;  Location: [facility];  Service: Open Heart Surgery;  Laterality: N/A;  . TRANSCATHETER AORTIC VALVE REPLACEMENT, TRANSFEMORAL N/A 08/31/2014   Procedure: TRANSCATHETER AORTIC VALVE REPLACEMENT, TRANSFEMORAL approach;  Surgeon: Rexene Alberts, MD;  Location: [facility];  Service: Open  Heart Surgery;  Laterality: N/A;  . TUBAL LIGATION  1970's  . VAGINAL HYSTERECTOMY  1970's   Partial     Allergies  Allergen Reactions  . Adhesive [Tape] Rash  . Latex Rash  . Tetanus Toxoids Rash    Current Outpatient Medications  Medication Sig Dispense Refill  . Cholecalciferol (VITAMIN D3) 25 MCG (1000 UT) CAPS Take 2 capsules by mouth daily.    Marland Kitchen estradiol (ESTRACE) 2 MG tablet TAKE 1 TABLET BY MOUTH ONCE DAILY 90 tablet 0  . furosemide (LASIX) 20 MG tablet Take 1 tablet (20 mg total) by mouth every other day. 30 tablet 6  . ipratropium (ATROVENT) 0.06 % nasal spray USE 1 SPRAY(S) IN EACH NOSTRIL EVERY 6 HOURS AS NEEDED FOR RUNNY NOSE 15 mL 0  . irbesartan (AVAPRO) 150 MG tablet Take 1 tablet by mouth once daily 90 tablet 0  . meloxicam (MOBIC) 15 MG tablet Take 1 tablet by mouth once daily 30 tablet 0  . metoprolol succinate (TOPROL-XL) 100 MG 24 hr tablet Take 1 tablet by mouth once daily 90 tablet 0  . nitroGLYCERIN (NITROSTAT) 0.4 MG SL tablet Place 1 tablet (0.4 mg total) under the tongue every 5 (five) minutes as needed for chest pain. 25 tablet 6  . pantoprazole (PROTONIX) 40 MG tablet TAKE 1 TABLET BY MOUTH ONCE DAILY BEFORE SUPPER 90 tablet 1  . potassium chloride (KLOR-CON) 10 MEQ CR tablet Take 10 mEq by mouth daily as needed (when taking Lasix).     . rosuvastatin (CRESTOR) 20 MG tablet TAKE 1 TABLET BY MOUTH ONCE DAILY 90 tablet 0  . thyroid (NP THYROID) 90 MG tablet Take 1 tablet (90 mg total) by mouth daily. 30 tablet 3  . warfarin (COUMADIN) 2.5 MG tablet Take 2.5 mg by mouth daily at 6 PM.     . amiodarone (PACERONE)  200 MG tablet Take 1 tablet (200 mg total) by mouth daily. 90 tablet 3  . diltiazem (CARDIZEM CD) 180 MG 24 hr capsule Take 1 capsule (180 mg total) by mouth daily. 90 capsule 3   No current facility-administered medications for this visit.     Social History   Socioeconomic History  . Marital status: Married    Spouse name: bobby  . Number of  children: 1  . Years of education: Not on file  . Highest education level: High school graduate  Occupational History  . 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 Financial resource strain: Not very hard  . Food insecurity    Worry: Never true    Inability: Never true  . Transportation needs    Medical: No    Non-medical: No  Tobacco Use  . Smoking status: Former Smoker    Packs/day: 2.00    Years: 56.00    Pack years: 112.00    Types: Cigarettes    Start date: 03/02/1952    Quit date: 06/01/2010    Years since quitting: 8.6  . Smokeless tobacco: Never Used  . Tobacco comment: HAD WEANED DOWN TO 1/4 PK A DAY BEFORE D/C  Substance and Sexual Activity  . Alcohol use: No    Alcohol/week: 0.0 standard drinks    Frequency: Never    Comment: 08/29/2014 "never drank much; quit in the 1990's"  . Drug use: No  . Sexual activity: Yes    Partners: Male  Lifestyle  . Physical activity    Days per week: 0 days    Minutes per session: 0 min  . Stress: Only a little  Relationships  . Social connections    Talks on phone: More than three times a week    Gets together: More than three times a week    Attends religious service: Patient refused    Active member of club or organization: Patient refused    Attends meetings of clubs or organizations: Never    Relationship status: Married  . Intimate partner violence    Fear of current or ex partner: No    Emotionally abused: No    Physically abused: No    Forced sexual activity: No  Other Topics Concern  . Not on file  Social History Narrative   Married for 36 years.Lives with husband. Retired,used to work at [employer].      Mortimer Fries has 3 daughters   Patient has one daughter, 3 grands   Likes to crochet   Works in [employer].     Family History  Problem Relation Age of Onset  . Heart disease Mother 95       died young  . Varicose Veins Mother   . Stroke Father   . Hypertension Father   . Heart  disease Father 45       Aneurysm  . Heart attack Father   . Arthritis Brother   . Stroke Brother   . Diabetes Daughter   . Arthritis Daughter   . Allergic rhinitis Neg Hx   . Angioedema Neg Hx   . Asthma Neg Hx   . Atopy Neg Hx   . Eczema Neg Hx   . Immunodeficiency Neg Hx   . Urticaria Neg Hx    Social history is notable that she is married has one child. She quit tobacco in April 2012 after a long-standing tobacco history having started smoking at age 71.  ROS General: Negative; No fevers, chills, or night sweats;  HEENT: Negative; No changes in vision or hearing, sinus congestion, difficulty swallowing Pulmonary: Negative; No cough, wheezing, shortness of breath, hemoptysis Cardiovascular: See history of present illness.   GI: Negative; No nausea, vomiting, diarrhea, or abdominal pain GU: Negative; No dysuria, hematuria, or difficulty voiding Musculoskeletal: Negative; no myalgias, joint pain, or weakness Hematologic/Oncology: Negative; no easy bruising, bleeding Endocrine: Negative; no heat/cold intolerance; no diabetes Neuro: Negative; no changes in balance, headaches Skin: Negative; No rashes or skin lesions Psychiatric: Negative; No behavioral problems, depression Sleep: Negative; No snoring, daytime sleepiness, hypersomnolence, bruxism, restless legs, hypnogognic hallucinations, no cataplexy Other comprehensive 14 point system review is negative.   PE BP (!) 159/61   Pulse (!) 53   Temp (!) 97.2 F (36.2 C)   Ht '5\' 10"'  (1.778 m)   Wt 180 lb 9.6 oz (81.9 kg)   SpO2 91%   BMI 25.91 kg/m    Repeat blood pressure by me was 142/60.  She states her blood pressure at home typically is 135 over the 60s  Wt Readings from Last 3 Encounters:  01/17/19 180 lb 9.6 oz (81.9 kg)  12/05/18 181 lb (82.1 kg)  10/26/18 178 lb 12.8 oz (81.1 kg)    General: Alert, oriented, no distress.  Skin: normal turgor, no rashes, warm and dry HEENT: Normocephalic, atraumatic. Pupils  equal round and reactive to light; sclera anicteric; extraocular muscles intact;  Nose without nasal septal hypertrophy Mouth/Parynx benign; Mallinpatti scale 3 Neck: No JVD, no carotid bruits; normal carotid upstroke Lungs: clear to ausculatation and percussion; no wheezing or rales Chest wall: without tenderness to palpitation Heart: PMI not displaced, RRR, s1 s2 normal, 7-1/2 systolic murmur, no diastolic murmur, no rubs, gallops, thrills, or heaves Abdomen: soft, nontender; no hepatosplenomehaly, BS+; abdominal aorta nontender and not dilated by palpation. Back: no CVA tenderness Pulses 2+ Musculoskeletal: full range of motion, normal strength, no joint deformities Extremities: no clubbing cyanosis or edema, Homan's sign negative  Neurologic: grossly nonfocal; Cranial nerves grossly wnl Psychologic: Normal mood and affect   ECG (independently read by me): Sinus bradycardia 51 bpm, first-degree AV block, left bundle branch block with repolarization changes.  August 2020 ECG (independently read by me): Atrial Fibrillation at 67; LBBB with repolarization  March 2020 ECG (independently read by me): Atrial fibrillation with ventricular rate at 87.  Left bundle branch block with repolarization changes.  March 2019 ECG (independently read by me): sinus bradycardia 56 bpm.  Left bundle branch block.  PR interval 198 ms.  04/14/2017 ECG (independently read by me): Atrial fibrillation at 77 bpm left bundle branch block.  Left axis deviation.  QTc interval 520 ms  August 2018 ECG (independently read by me): Sinus bradycardia with occasional PACs.  Left axis deviation.  Borderline first-degree AV block with a PR interval of 22 ms.  Left bundle-branch block  March 2018 ECG (independently read by me): Sinus bradycardia 52 bpm.  First-degree AV block with a PR interval of 210 ms.  Left bundle branch block with repolarization changes.  PE the C.  January 2018 ECG (independently read by me): Atrial  fibrillation with a rate at 91.  Left bundle-branch block.  December 2017 ECG (independently read by me): Sinus bradycardia 57 bpm.  Left bundle branch block with repolarization changes.  October 2017 ECG (independently read by me): Sinus rhythm at 74 bpm with frequent PVCs in a trigeminal pattern.  March 2017 ECG (independently read by me): Sinus  bradycardia 54 bpm.  Left bundle branch block with repolarization changes.  November 2016 ECG (independently read by me): Sinus bradycardia 55 bpm with occasional premature atrial complex.  Left bundle-branch block.  February 2016 ECG (independently read by me):  Sinus bradycardia 57 bpm.  Left bundle branch block with repolarization changes..  No ectopy.  December 2015 ECG (independently read by me): Sinus bradycardia 58 bpm.  Mild left atrial enlargement.  Left axis deviation.  Left bundle branch block with repolarization changes.  October 2014 ECG: Sinus bradycardia with left axis deviation. Left bundle branch block with repolarization changes  LAB BMP Latest Ref Rng & Units 12/05/2018 11/21/2018 05/17/2018  Glucose 65 - 99 mg/dL 87 102(H) 96  BUN 7 - 25 mg/dL 25 31(H) 17  Creatinine 0.60 - 0.93 mg/dL 1.34(H) 1.38(H) 1.06(H)  BUN/Creat Ratio 6 - 22 (calc) '19 22 16  ' Sodium 135 - 146 mmol/L 141 138 139  Potassium 3.5 - 5.3 mmol/L 4.6 4.8 5.3(H)  Chloride 98 - 110 mmol/L 105 103 103  CO2 20 - 32 mmol/L '26 25 24  ' Calcium 8.6 - 10.4 mg/dL 8.8 9.1 9.0   Hepatic Function Latest Ref Rng & Units 12/05/2018 11/21/2018 05/17/2018  Total Protein 6.1 - 8.1 g/dL 6.2 6.5 6.2  Albumin 3.7 - 4.7 g/dL - 4.1 4.0  AST 10 - 35 U/L '18 23 19  ' ALT 6 - 29 U/L '14 20 14  ' Alk Phosphatase 39 - 117 IU/L - 101 76  Total Bilirubin 0.2 - 1.2 mg/dL 0.8 0.6 1.0   CBC Latest Ref Rng & Units 05/17/2018 04/13/2017 03/03/2017  WBC 3.4 - 10.8 x10E3/uL 7.5 5.7 6.9  Hemoglobin 11.1 - 15.9 g/dL 15.4 15.0 14.6  Hematocrit 34.0 - 46.6 % 47.0(H) 43.8 46.2(H)  Platelets 150 - 450  x10E3/uL 163 125(L) 130(L)   Lab Results  Component Value Date   MCV 92 05/17/2018   MCV 89.9 04/13/2017   MCV 96.3 03/03/2017   Lab Results  Component Value Date   TSH 4.23 12/05/2018   Lipid Panel     Component Value Date/Time   CHOL 126 11/21/2018 1146   TRIG 116 11/21/2018 1146   HDL 51 11/21/2018 1146   CHOLHDL 2.5 11/21/2018 1146   CHOLHDL 2.7 04/13/2017 1439   VLDL 40 (H) 01/16/2015 1028   LDLCALC 54 11/21/2018 1146   LDLCALC 50 04/13/2017 1439     RADIOLOGY: No results found.  IMPRESSION:  1. S/P TAVR (transcatheter aortic valve replacement)   2. PAF (paroxysmal atrial fibrillation) ([place])   3. Warfarin anticoagulation   4. Bilateral carotid artery stenosis   5. Hyperlipidemia with target LDL less than 70     ASSESSMENT AND PLAN: Ms. Strege is a 78 year old female who has documented CAD and PVD who developed progressive aortic valve stenosis and underwent successful TAVR procedure on 08/31/2014 with placement of a 26 mm Edward Sapien 3 Valve from the right femoral approach.  She later developed stage II hypertension resulting in increased medical regimen.   She developed postoperative atrial fibrillation and previouslyconverted back to sinus rhythm on her increased beta blocker dose.  She is on warfarin for anticoagulation and denies bleeding.  An echo Doppler study in July 2018 continued to show normal systolic function.  Her bioprosthetic TAVR valve was functioning well without mobility restriction.  The mean gradient was 10 with a peak gradient of 21.  When I saw her on 04/14/2017, her ECG demonstrated recurrent atrial fibrillation of questionable duration.  She was  anticoagulated on warfarin.  I further titrated metoprolol succinate up to 100 mg daily.  An echo Doppler study in March 2019 revealed normal systolic function, and grade 2 diastolic dysfunction.  Her bioprosthetic valve was functioning well.  The mean gradient was 12 and peak gradient 25.  There was no  aortic insufficiency.  PA peak pressure was 34 mm.  When seen in March 2020 Ms. Engelson again had developed recurrent atrial fibrillation.  An echo Doppler study on May 19, 2018  showed mild concentric LVH and EF 60 to 65%.  She was in atrial fibrillation at that time.  Her 26 mm Edwards sapient bioprosthetic aortic valve was present and functioning normally.  When I last saw her in August 2020 she was in atrial fibrillation and since she had been in atrial fibrillation on 3 different occurrences in 2018, 2019, and now in 2020 I recommended the addition of amiodarone 200 mg daily.  She continues to be on amiodarone presently.  Her ECG today verifies that she is back in sinus rhythm.  She is unaware of when she converted.  Her INR is therapeutic on warfarin.  Her blood pressure today was elevated initially and she states at home typically it runs in the 689 systolic range.  With her ankle edema, I have suggested she increase her Lasix from 2 times per week and take this every other day.  She continues to be on irbesartan 150 mg, diltiazem 180 mg, and metoprolol succinate 100 mg in addition to her furosemide.  She is on rosuvastatin 20 mg for hyperlipidemia.  Most recent LDL cholesterol was excellent at 46.  She has documented mild carotid plaque with 40 to 50% stenoses on the right and less than 39% on the left with normal vertebral and subclavian flow bilaterally.  I have recommended she continue amiodarone at its present dose for now and in the future it may be possible to perhaps decrease this to 100 mg daily.  She will undergo a follow-up echo Doppler study in April 2021 and I will see her in follow-up for further evaluation.  Time spent: 25 minutes Troy Sine, MD, [facility]  01/19/2019 5:20 PM

## 2019-01-17 NOTE — Patient Instructions (Signed)
Medication Instructions:  TAKE FUROSEMIDE EVERY-OTHER-DAY CONTINUE AMIODARONE 200MG  DAILY If you need a refill on your cardiac medications before your next appointment, please call your pharmacy.  Testing/Procedures: Echocardiogram - Your physician has requested that you have an echocardiogram. Echocardiography is a painless test that uses sound waves to create images of your heart. It provides your doctor with information about the size and shape of your heart and how well your heart's chambers and valves are working. This procedure takes approximately one hour. There are no restrictions for this procedure. This will be performed at our [facility] location - [address], [address].   Follow-Up: AFTER ECHO In Person Shelva Majestic, MD.    At [facility], you and your health needs are our priority.  As part of our continuing mission to provide you with exceptional heart care, we have created designated Provider Care Teams.  These Care Teams include your primary Cardiologist (physician) and Advanced Practice Providers (APPs -  Physician Assistants and Nurse Practitioners) who all work together to provide you with the care you need, when you need it.  Thank you for choosing [facility]!!

## 2019-01-19 ENCOUNTER — Encounter: Payer: Self-pay | Admitting: Cardiovascular Disease

## 2019-01-20 LAB — POCT INR: INR: 3.3 — AB (ref 2.0–3.0)

## 2019-01-23 ENCOUNTER — Telehealth (INDEPENDENT_AMBULATORY_CARE_PROVIDER_SITE_OTHER): Payer: Self-pay | Admitting: Nurse Practitioner

## 2019-01-23 NOTE — Telephone Encounter (Signed)
I attempted to call this patient today to address her INR result which is above goal.  Her goal is anywhere between 2-3 and current INR today is 3.3.  She did not answer the phone.  I did ask her to call the office back.

## 2019-01-24 ENCOUNTER — Telehealth (INDEPENDENT_AMBULATORY_CARE_PROVIDER_SITE_OTHER): Payer: Self-pay | Admitting: Nurse Practitioner

## 2019-01-24 NOTE — Telephone Encounter (Signed)
I was able to contact this patient today to discuss her INR result.  It is out of range.  I recommended that she skip 1 dose of her warfarin this week.  She did tell me that she forgot a dose earlier in the week already.  She also mentioned that she just lost her husband.  I expressed our sympathies and encouraged her to call the office with any questions or concerns.  She will check INR again next week.

## 2019-01-26 LAB — POCT INR

## 2019-01-30 ENCOUNTER — Telehealth (INDEPENDENT_AMBULATORY_CARE_PROVIDER_SITE_OTHER): Payer: Self-pay | Admitting: Nurse Practitioner

## 2019-01-30 NOTE — Telephone Encounter (Signed)
I called this patient today to discuss her INR result which is 2.0.  Her goal is 2.0-3.0.  She is at goal so she will continue on current medication dose.  She was encouraged to check her INR as scheduled next week.  She expressed understanding.

## 2019-02-02 DIAGNOSIS — Z7901 Long term (current) use of anticoagulants: Secondary | ICD-10-CM | POA: Diagnosis not present

## 2019-02-02 LAB — PROTIME-INR: INR: 2.9 — AB (ref 0.9–1.1)

## 2019-02-05 ENCOUNTER — Other Ambulatory Visit (INDEPENDENT_AMBULATORY_CARE_PROVIDER_SITE_OTHER): Payer: Self-pay | Admitting: Internal Medicine

## 2019-02-09 ENCOUNTER — Encounter: Payer: Self-pay | Admitting: Nurse Practitioner

## 2019-02-09 ENCOUNTER — Telehealth (INDEPENDENT_AMBULATORY_CARE_PROVIDER_SITE_OTHER): Payer: Self-pay | Admitting: Nurse Practitioner

## 2019-02-09 LAB — POCT INR: INR: 1.9 — AB (ref 2.0–3.0)

## 2019-02-09 NOTE — Telephone Encounter (Signed)
I called this patient today to inquire about her INR results as we did not receive them yet this week. She did not answer the phone, but I was able to leave a message asking her to call back with her results. Only first name was used on message.

## 2019-02-09 NOTE — Telephone Encounter (Signed)
Patient's INR results was received it was 1.9 which is right below her goal.  Historically she has been well controlled on her current dose of warfarin.  I recommend that she continue on the same dose of warfarin and recheck next week.  She tells me she understands.

## 2019-02-16 ENCOUNTER — Other Ambulatory Visit (INDEPENDENT_AMBULATORY_CARE_PROVIDER_SITE_OTHER): Payer: Self-pay | Admitting: Internal Medicine

## 2019-02-16 ENCOUNTER — Other Ambulatory Visit: Payer: Self-pay | Admitting: Cardiovascular Disease

## 2019-02-16 ENCOUNTER — Other Ambulatory Visit: Payer: Self-pay | Admitting: Gastroenterology

## 2019-02-16 LAB — POCT INR: INR: 1.7 — AB (ref 2.0–3.0)

## 2019-02-16 NOTE — Telephone Encounter (Signed)
Please arrange office visit. I have refilled prescriptions till then.

## 2019-02-18 ENCOUNTER — Emergency Department (HOSPITAL_COMMUNITY)
Admission: EM | Admit: 2019-02-18 | Discharge: 2019-02-18 | Disposition: A | Discharge status: Home / Self Care | Payer: Medicare Other | Attending: Emergency Medicine | Admitting: Emergency Medicine

## 2019-02-18 ENCOUNTER — Encounter (HOSPITAL_COMMUNITY): Payer: Self-pay | Admitting: Emergency Medicine

## 2019-02-18 ENCOUNTER — Other Ambulatory Visit: Payer: Self-pay

## 2019-02-18 DIAGNOSIS — Z91040 Latex allergy status: Secondary | ICD-10-CM | POA: Insufficient documentation

## 2019-02-18 DIAGNOSIS — J45909 Unspecified asthma, uncomplicated: Secondary | ICD-10-CM | POA: Diagnosis not present

## 2019-02-18 DIAGNOSIS — Z79899 Other long term (current) drug therapy: Secondary | ICD-10-CM | POA: Diagnosis not present

## 2019-02-18 DIAGNOSIS — I951 Orthostatic hypotension: Secondary | ICD-10-CM | POA: Diagnosis not present

## 2019-02-18 DIAGNOSIS — R531 Weakness: Secondary | ICD-10-CM | POA: Diagnosis present

## 2019-02-18 DIAGNOSIS — I2510 Atherosclerotic heart disease of native coronary artery without angina pectoris: Secondary | ICD-10-CM | POA: Insufficient documentation

## 2019-02-18 DIAGNOSIS — Z87891 Personal history of nicotine dependence: Secondary | ICD-10-CM | POA: Insufficient documentation

## 2019-02-18 DIAGNOSIS — I10 Essential (primary) hypertension: Secondary | ICD-10-CM | POA: Insufficient documentation

## 2019-02-18 DIAGNOSIS — E876 Hypokalemia: Secondary | ICD-10-CM

## 2019-02-18 DIAGNOSIS — U071 COVID-19: Secondary | ICD-10-CM | POA: Insufficient documentation

## 2019-02-18 DIAGNOSIS — Z85828 Personal history of other malignant neoplasm of skin: Secondary | ICD-10-CM | POA: Insufficient documentation

## 2019-02-18 LAB — PROTIME-INR
INR: 2.1 — ABNORMAL HIGH (ref 0.8–1.2)
Prothrombin Time: 23.5 seconds — ABNORMAL HIGH (ref 11.4–15.2)

## 2019-02-18 LAB — COMPREHENSIVE METABOLIC PANEL
ALT: 39 U/L (ref 0–44)
AST: 39 U/L (ref 15–41)
Albumin: 3.6 g/dL (ref 3.5–5.0)
Alkaline Phosphatase: 72 U/L (ref 38–126)
Anion gap: 10 (ref 5–15)
BUN: 14 mg/dL (ref 8–23)
CO2: 28 mmol/L (ref 22–32)
Calcium: 8.3 mg/dL — ABNORMAL LOW (ref 8.9–10.3)
Chloride: 99 mmol/L (ref 98–111)
Creatinine, Ser: 1.17 mg/dL — ABNORMAL HIGH (ref 0.44–1.00)
GFR calc Af Amer: 52 mL/min — ABNORMAL LOW (ref >60)
GFR calc non Af Amer: 45 mL/min — ABNORMAL LOW (ref >60)
Glucose, Bld: 125 mg/dL — ABNORMAL HIGH (ref 70–99)
Potassium: 3.1 mmol/L — ABNORMAL LOW (ref 3.5–5.1)
Sodium: 137 mmol/L (ref 135–145)
Total Bilirubin: 0.8 mg/dL (ref 0.3–1.2)
Total Protein: 6.8 g/dL (ref 6.5–8.1)

## 2019-02-18 LAB — CBG MONITORING, ED: Glucose-Capillary: 124 mg/dL — ABNORMAL HIGH (ref 70–99)

## 2019-02-18 LAB — TYPE AND SCREEN
ABO/RH(D): B POS
Antibody Screen: NEGATIVE

## 2019-02-18 LAB — CBC WITH DIFFERENTIAL/PLATELET
Abs Immature Granulocytes: 0.01 10*3/uL (ref 0.00–0.07)
Basophils Absolute: 0 10*3/uL (ref 0.0–0.1)
Basophils Relative: 1 %
Eosinophils Absolute: 0 10*3/uL (ref 0.0–0.5)
Eosinophils Relative: 0 %
HCT: 52 % — ABNORMAL HIGH (ref 36.0–46.0)
Hemoglobin: 16.8 g/dL — ABNORMAL HIGH (ref 12.0–15.0)
Immature Granulocytes: 0 %
Lymphocytes Relative: 17 %
Lymphs Abs: 0.5 10*3/uL — ABNORMAL LOW (ref 0.7–4.0)
MCH: 30.1 pg (ref 26.0–34.0)
MCHC: 32.3 g/dL (ref 30.0–36.0)
MCV: 93 fL (ref 80.0–100.0)
Monocytes Absolute: 0.3 10*3/uL (ref 0.1–1.0)
Monocytes Relative: 10 %
Neutro Abs: 2.1 10*3/uL (ref 1.7–7.7)
Neutrophils Relative %: 72 %
Platelets: 99 10*3/uL — ABNORMAL LOW (ref 150–400)
RBC: 5.59 MIL/uL — ABNORMAL HIGH (ref 3.87–5.11)
RDW: 14.6 % (ref 11.5–15.5)
WBC: 2.9 10*3/uL — ABNORMAL LOW (ref 4.0–10.5)
nRBC: 0 % (ref 0.0–0.2)

## 2019-02-18 LAB — POC OCCULT BLOOD, ED: Fecal Occult Bld: NEGATIVE

## 2019-02-18 LAB — LACTIC ACID, PLASMA: Lactic Acid, Venous: 1 mmol/L (ref 0.5–1.9)

## 2019-02-18 MED ORDER — POTASSIUM CHLORIDE CRYS ER 20 MEQ PO TBCR: 20.0000 meq | EXTENDED_RELEASE_TABLET | Freq: Every day | ORAL | 0 refills | Status: DC

## 2019-02-18 MED ORDER — POTASSIUM CHLORIDE CRYS ER 20 MEQ PO TBCR
40.0000 meq | EXTENDED_RELEASE_TABLET | Freq: Once | ORAL | Status: AC
  Administered 2019-02-18: 40 meq via ORAL
  Filled 2019-02-18: qty 2

## 2019-02-18 MED ORDER — SODIUM CHLORIDE 0.9 % IV BOLUS
2000.0000 mL | Freq: Once | INTRAVENOUS | Status: AC
  Administered 2019-02-18: 2000 mL via INTRAVENOUS

## 2019-02-18 NOTE — ED Triage Notes (Addendum)
Patient c/o hypotension with standing. Per patient started having a headache on Monday which resolved after taking tylenol. Per patient started getting dizzy and nauseated with standing on Tuesday. Denies any vomiting. Patient reports running a temp of 101 on Tuesday but states no fever since. Patient still feeling dizzy and nauseated with standing. Orthostatic vitals checked-per patient blood pressure dropped to 50/40 with standing. Patient has hx of a-fib which she shows on the monitor with rate 98-110.

## 2019-02-18 NOTE — Discharge Instructions (Signed)
Get help right away if you: Have chest pain. Have a fast or irregular heartbeat. Develop numbness in any part of your body. Cannot move your arms or your legs. Have trouble speaking. Become sweaty or feel light-headed. Faint. Feel short of breath. Have trouble staying awake. Feel confused.

## 2019-02-18 NOTE — ED Provider Notes (Signed)
[facility] Provider Note   CSN: [id number] Arrival date & time: 02/18/19  1301     History Chief Complaint  Patient presents with  . Hypotension    Katelyn [name] is a 78 y.o. female who presents emergency department with chief complaint of weakness and lightheadedness.  She has a past medical history of coronary artery disease, reflux, lumbar radiculopathy, previous stroke, claudication, and severe aortic stenosis status post TAVR.  Patient states that she has had about 4 days of generalized weakness.  She feels like this is worse when she stands up, better with rest.  She said that this past Tuesday she had a fever up to 101 F.  He denies urinary symptoms, frequency, urgency.  She denies melena or hematochezia.  She is on Coumadin for chronic A. fib.  She denies chest pain, racing or skipping in her heart.  She denies cough, loss of sense of taste or smell, diarrhea, or exposure to anyone with coronavirus.  HPI     Past Medical History:  Diagnosis Date  . Allergy    rhinitis  . Aortic stenosis, severe   . Aortic stenosis, severe   . Arthritis    spine and various joints  . Cataract   . Cerebrovascular disease 12/05/2018  . Childhood asthma   . Chronic bronchitis ([place])       . Claudication ([place])   . Coronary artery disease    a. April 2012 which revealed mid RCA occlusion with collaterals, 50% LAD stenosis, 30% circumflex stenosis, and 40% marginal stenosis  . GERD (gastroesophageal reflux disease)   . Heart murmur   . Hyperlipidemia   . Hypertension   . LUMBAR RADICULOPATHY, RIGHT 12/21/2007   Qualifier: Diagnosis of  By: Aline Brochure MD, Dorothyann Peng    . LUMBAR RADICULOPATHY, RIGHT   . Peripheral vascular disease ([place])   . 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 Seroma, postoperative    Left Groin  . Severe aortic valve stenosis  08/31/2014  . Stroke [facility]) 1982 X 2   "a little bit weaker on the left side since" (08/29/2014)    Patient Active Problem List   Diagnosis Date Noted  . Cerebrovascular disease 12/05/2018  . Environmental and seasonal allergies 10/13/2017  . Gastroesophageal reflux disease 04/13/2017  . Esophageal dysphagia 04/13/2017  . Chronic anticoagulation 03/10/2017  . Current long-term use of postmenopausal hormone replacement therapy 03/10/2017  . Non-allergic rhinitis 11/10/2016  . Paroxysmal atrial fibrillation ([place]) 02/13/2016  . 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 Tachycardia-bradycardia syndrome ([place])   . Atrial fibrillation with RVR ([place]) 08/29/2014  . LBBB (left bundle branch block), chronic 08/29/2014  . CAD in native artery   . Peripheral vascular disease ([place]) 12/13/2012  . Essential hypertension 12/13/2012  . Hyperlipidemia with target LDL less than 70 12/13/2012  . Atherosclerosis of native arteries of extremity with intermittent claudication ([place]) 08/10/2011    Past Surgical History:  Procedure Laterality Date  . AORTO-FEMORAL BYPASS GRAFT Bilateral 06/27/10  . BASAL CELL CARCINOMA EXCISION Left    face  . CARDIAC CATHETERIZATION N/A 07/25/2014   Procedure: Right/Left Heart Cath and Coronary Angiography;  Surgeon: Troy Sine, MD;  Location: [facility];  Service: Cardiovascular;  Laterality: N/A;  . CATARACT EXTRACTION, BILATERAL Bilateral 09/29/2016  . COLONOSCOPY  2008   Dr. Laural Golden: normal.   . DILATION AND  CURETTAGE OF UTERUS  "2 or 3"  . ESOPHAGOGASTRODUODENOSCOPY N/A 08/18/2017   Procedure: ESOPHAGOGASTRODUODENOSCOPY (EGD);  Surgeon: Daneil Dolin, MD;  [facility];  Service: Endoscopy;  Laterality: N/A;  2:00pm  . MALONEY DILATION N/A 08/18/2017   Procedure: Venia Minks DILATION;  Surgeon: Daneil Dolin, MD;  Location: [facility];  Service: Endoscopy;  Laterality: N/A;  . MULTIPLE TOOTH EXTRACTIONS    . PR VEIN BYPASS  GRAFT,AORTO-FEM-POP  06/12/10  . TEE WITHOUT CARDIOVERSION N/A 08/31/2014   Procedure: TRANSESOPHAGEAL ECHOCARDIOGRAM (TEE);  Surgeon: Rexene Alberts, MD;  Location: [facility];  Service: Open Heart Surgery;  Laterality: N/A;  . TRANSCATHETER AORTIC VALVE REPLACEMENT, TRANSFEMORAL N/A 08/31/2014   Procedure: TRANSCATHETER AORTIC VALVE REPLACEMENT, TRANSFEMORAL approach;  Surgeon: Rexene Alberts, MD;  Location: [facility];  Service: Open Heart Surgery;  Laterality: N/A;  . TUBAL LIGATION  1970's  . VAGINAL HYSTERECTOMY  1970's   Partial      OB History   No obstetric history on file.     Family History  Problem Relation Age of Onset  . Heart disease Mother 52       died young  . Varicose Veins Mother   . Stroke Father   . Hypertension Father   . Heart disease Father 76       Aneurysm  . Heart attack Father   . Arthritis Brother   . Stroke Brother   . Diabetes Daughter   . Arthritis Daughter   . Allergic rhinitis Neg Hx   . Angioedema Neg Hx   . Asthma Neg Hx   . Atopy Neg Hx   . Eczema Neg Hx   . Immunodeficiency Neg Hx   . Urticaria Neg Hx     Social History   Tobacco Use  . Smoking status: Former Smoker    Packs/day: 2.00    Years: 56.00    Pack years: 112.00    Types: Cigarettes    Start date: 03/02/1952    Quit date: 06/01/2010    Years since quitting: 8.7  . Smokeless tobacco: Never Used  . Tobacco comment: HAD WEANED DOWN TO 1/4 PK A DAY BEFORE D/C  Substance Use Topics  . Alcohol use: No    Alcohol/week: 0.0 standard drinks    Comment: 08/29/2014 "never drank much; quit in the 1990's"  . Drug use: No    Home Medications Prior to Admission medications   Medication Sig Start Date End Date Taking? Authorizing Provider  amiodarone (PACERONE) 200 MG tablet Take 1 tablet (200 mg total) by mouth daily. 01/17/19   Troy Sine, MD  Cholecalciferol (VITAMIN D3) 25 MCG (1000 UT) CAPS Take 2 capsules by mouth daily.    [provider]  diltiazem (CARDIZEM CD) 180  MG 24 hr capsule Take 1 capsule (180 mg total) by mouth daily. 05/12/18 12/05/18  Troy Sine, MD  estradiol (ESTRACE) 2 MG tablet TAKE 1 TABLET BY MOUTH ONCE DAILY 01/11/18   Fayrene Helper, MD  furosemide (LASIX) 20 MG tablet Take 1 tablet (20 mg total) by mouth every other day. 01/17/19   Troy Sine, MD  ipratropium (ATROVENT) 0.06 % nasal spray USE 1 SPRAY(S) IN EACH NOSTRIL EVERY 6 HOURS AS NEEDED FOR RUNNY NOSE 02/06/19   Doree Albee, MD  irbesartan (AVAPRO) 150 MG tablet Take 1 tablet by mouth once daily 02/16/19   Troy Sine, MD  meloxicam (MOBIC) 15 MG tablet Take 1 tablet by mouth  once daily 02/16/19   Doree Albee, MD  metoprolol succinate (TOPROL-XL) 100 MG 24 hr tablet Take 1 tablet by mouth once daily 11/01/18   Troy Sine, MD  nitroGLYCERIN (NITROSTAT) 0.4 MG SL tablet Place 1 tablet (0.4 mg total) under the tongue every 5 (five) minutes as needed for chest pain. 11/18/17   Ledora Bottcher, PA  pantoprazole (PROTONIX) 40 MG tablet TAKE 1 TABLET BY MOUTH ONCE DAILY BEFORE SUPPER 02/16/19   Annitta Needs, NP  potassium chloride (KLOR-CON) 10 MEQ CR tablet Take 10 mEq by mouth daily as needed (when taking Lasix).     [provider]  rosuvastatin (CRESTOR) 20 MG tablet TAKE 1 TABLET BY MOUTH ONCE DAILY 09/06/17   Caren Macadam, MD  thyroid (NP THYROID) 90 MG tablet Take 1 tablet (90 mg total) by mouth daily. 12/06/18   Doree Albee, MD  warfarin (COUMADIN) 2.5 MG tablet Take 1 tablet by mouth once daily 02/16/19   Gosrani, Nimish C, MD  ipratropium (ATROVENT) 0.06 % nasal spray USE 1 SPRAY(S) IN EACH NOSTRIL EVERY 6 HOURS AS NEEDED FOR RUNNY NOSE 12/27/18   Gosrani, Nimish C, MD  meloxicam (MOBIC) 15 MG tablet Take 15 mg by mouth daily. 07/02/17   [provider]  meloxicam (MOBIC) 15 MG tablet Take 1 tablet by mouth once daily 12/01/18   Hurshel Party C, MD    Allergies    Adhesive [tape], Latex, and Tetanus toxoids  Review of  Systems   Review of Systems Ten systems reviewed and are negative for acute change, except as noted in the HPI.   Physical Exam Updated Vital Signs BP (!) 150/97 (BP Location: Right Arm)   Pulse (!) 105   Temp 98.4 F (36.9 C) (Oral)   Resp 18   Ht 5\' 10"  (1.778 m)   Wt 77.1 kg   SpO2 96%   BMI 24.39 kg/m   Physical Exam Vitals and nursing note reviewed.  Constitutional:      General: She is not in acute distress.    Appearance: She is well-developed. She is not diaphoretic.  HENT:     Head: Normocephalic and atraumatic.  Eyes:     General: No scleral icterus.    Conjunctiva/sclera: Conjunctivae normal.  Cardiovascular:     Rate and Rhythm: Normal rate and regular rhythm.     Heart sounds: Normal heart sounds. No murmur. No friction rub. No gallop.   Pulmonary:     Effort: Pulmonary effort is normal. No respiratory distress.     Breath sounds: Normal breath sounds.  Abdominal:     General: Bowel sounds are normal. There is no distension.     Palpations: Abdomen is soft. There is no mass.     Tenderness: There is no abdominal tenderness. There is no guarding.  Genitourinary:    Comments: Digital Rectal Exam reveals sphincter with good tone. No external hemorrhoids. No masses or fissures. Stool color is brown with no overt blood. Hemmocult negative Musculoskeletal:     Cervical back: Normal range of motion.  Skin:    General: Skin is warm and dry.  Neurological:     General: No focal deficit present.     Mental Status: She is alert and oriented to person, place, and time.  Psychiatric:        Behavior: Behavior normal.     ED Results / Procedures / Treatments   Labs (all labs ordered are listed, but only abnormal results are  displayed) Labs Reviewed  CBG MONITORING, ED - Abnormal; Notable for the following components:      Result Value   Glucose-Capillary 124 (*)    All other components within normal limits  CBC WITH DIFFERENTIAL/PLATELET  COMPREHENSIVE  METABOLIC PANEL  PROTIME-INR  POC OCCULT BLOOD, ED  TYPE AND SCREEN    EKG EKG Interpretation  Date/Time:  Saturday February 18 2019 13:40:41 EST Ventricular Rate:  89 PR Interval:    QRS Duration: 157 QT Interval:  470 QTC Calculation: 572 R Axis:   -63 Text Interpretation: Atrial fibrillation Left bundle branch block Confirmed by Nat Christen [phone]) on 02/18/2019 10:06:35 PM    Radiology No results found.  Procedures Procedures (including critical care time)  Medications Ordered in ED Medications - No data to display  ED Course  I have reviewed the triage vital signs and the nursing notes.  Pertinent labs & imaging results that were available during my care of the patient were reviewed by me and considered in my medical decision making (see chart for details).  Clinical Course as of Feb 17 2202  Sat Feb 18, 2019  1445 Orthostatic Lying  BP- Lying:136/95 (!) Pulse- Lying:85 Orthostatic Sitting BP- Sitting:118/86 Pulse- Sitting:103 Orthostatic Standing at 0 minutes BP- Standing at 0 minutes:89/62 (!) Pulse- Standing at 0 minutes:123 Orthostatic Standing at 3 minutesOrthostatic Lying  BP- Lying:136/95 (!) Pulse- Lying:85 Orthostatic Sitting BP- Sitting:118/86 Pulse- Sitting:103 Orthostatic Standing at 0 minutes BP- Standing at 0 minutes:89/62 (!) Pulse- Standing at 0 minutes:123 Orthostatic Standing at 3 minutes   [AH]    Clinical Course User Index [AH] Margarita Mail, PA-C   MDM Rules/Calculators/A&P                      .  78 year old female presents with complaint of hypotension with standing.  Patient's vital signs do show significant orthostatic hypotension.  I reviewed the patient's lab which shows mildly elevated blood glucose of insignificant value. The patient has mild leukonpenia and thrombocytopenia.  Hemoglobin slightly elevated may be secondary to volume contraction.  Negative lactic acidosis.  EKG shows rate controlled A. fib at a  rate of 89.  She is a negative Hemoccult on my examination.  Patient given 2 L of fluid.  Although repeat orthostatics did show positive value from lying to standing her blood pressure did normalize after standing and she had no symptoms of dizziness.  This may be secondary to dehydration however patient may also be exhibiting some autonomic dysfunction.  He is currently asymptomatic and appears appropriate for discharge at this time.        Final Clinical Impression(s) / ED Diagnoses Final diagnoses:  Orthostatic hypotension  Hypokalemia    Rx / DC Orders ED Discharge Orders    None       Margarita Mail, PA-C 02/18/19 2216    Milton Ferguson, MD 02/22/19 [phone]

## 2019-02-19 ENCOUNTER — Other Ambulatory Visit: Payer: Self-pay | Admitting: Cardiovascular Disease

## 2019-02-19 LAB — SARS CORONAVIRUS 2 (TAT 6-24 HRS): SARS Coronavirus 2: POSITIVE — AB

## 2019-02-20 ENCOUNTER — Telehealth: Payer: Self-pay | Admitting: Nurse Practitioner

## 2019-02-20 ENCOUNTER — Telehealth (INDEPENDENT_AMBULATORY_CARE_PROVIDER_SITE_OTHER): Payer: Self-pay

## 2019-02-20 ENCOUNTER — Encounter: Payer: Self-pay | Admitting: Internal Medicine

## 2019-02-20 NOTE — Telephone Encounter (Signed)
Called to discuss with patient about Covid symptoms and the use of bamlanivimab, a monoclonal antibody infusion for those with mild to moderate Covid symptoms and at a high risk of hospitalization.  Pt is qualified for this infusion at the [facility] infusion center due to Age > 65   However, her symptoms started 02/13/2019, putting her at day 7 and she is beginning to feel better.  At this time would not qualify for transfusion on 02/22/2019 or 02/23/2019 due to length of period since symptom onset.  Discussed with her to continue to monitor and if any worsening SOB or difficulty breahing to be seen immediately or call her PCP.

## 2019-02-20 NOTE — Telephone Encounter (Signed)
Patient scheduled and letter sent  °

## 2019-02-20 NOTE — Telephone Encounter (Signed)
Katelyn [name] wanted me to let you know she tested positive for Covid this weekend.

## 2019-02-20 NOTE — Telephone Encounter (Signed)
Thanks.  Please let her know that she needs to quarantine for at least 2 weeks.  If she has fever, increasing shortness of breath especially, she should contact us.  If she is concerned about her symptoms, she must go to the emergency room

## 2019-02-20 NOTE — Telephone Encounter (Signed)
Called patient and gave her information °

## 2019-02-23 ENCOUNTER — Telehealth (INDEPENDENT_AMBULATORY_CARE_PROVIDER_SITE_OTHER): Payer: Self-pay | Admitting: Nurse Practitioner

## 2019-02-23 ENCOUNTER — Encounter: Payer: Self-pay | Admitting: Internal Medicine

## 2019-02-23 ENCOUNTER — Encounter: Payer: Self-pay | Admitting: Nurse Practitioner

## 2019-02-23 LAB — CULTURE, BLOOD (ROUTINE X 2)
Culture: NO GROWTH
Culture: NO GROWTH
Special Requests: ADEQUATE

## 2019-02-23 LAB — POCT INR: INR: 6.5 — AB (ref 2.0–3.0)

## 2019-02-23 NOTE — Telephone Encounter (Signed)
Patient's INR level received today.  It is 6.5 which is above her goal.  Her goal is to be between 2-3.  I did call her and told her to hold 2 doses in a row.  She denies any signs of bleeding such as easy bruising, bleeding in her gums, or noticing any blood in her stool.  She also was recently found to be positive for presence of SARS-CoV-2 virus, and she tells me that she is feeling much better now.  She tells me her symptoms were fairly mild while she was sick but she is already improving.  I encouraged her to continue quarantining at home until her 2 weeks are up.  She tells me she understands.  I asked that she recheck her INR on Monday, and if she were to notice any signs of bleeding as discussed above that she should proceed to the emergency department.  She tells me she understands.

## 2019-03-06 LAB — POCT INR: INR: 2 (ref 2.0–3.0)

## 2019-03-07 ENCOUNTER — Telehealth (INDEPENDENT_AMBULATORY_CARE_PROVIDER_SITE_OTHER): Payer: Self-pay | Admitting: Nurse Practitioner

## 2019-03-07 NOTE — Telephone Encounter (Signed)
I did call this patient to address her INR level.  It is 2.0 this is at goal.  I encouraged her to continue taking her dose of Coumadin as prescribed.  I also encouraged her to recheck her INR next week.

## 2019-03-13 LAB — POCT INR: INR: 2.1 (ref 2.0–3.0)

## 2019-03-14 ENCOUNTER — Ambulatory Visit (INDEPENDENT_AMBULATORY_CARE_PROVIDER_SITE_OTHER): Payer: Medicare Other | Admitting: Internal Medicine

## 2019-03-14 ENCOUNTER — Encounter (INDEPENDENT_AMBULATORY_CARE_PROVIDER_SITE_OTHER): Payer: Self-pay | Admitting: Internal Medicine

## 2019-03-14 ENCOUNTER — Other Ambulatory Visit: Payer: Self-pay

## 2019-03-14 VITALS — BP 140/60 | HR 46 | Ht 69.5 in | Wt 170.8 lb

## 2019-03-14 DIAGNOSIS — I10 Essential (primary) hypertension: Secondary | ICD-10-CM | POA: Diagnosis not present

## 2019-03-14 DIAGNOSIS — E785 Hyperlipidemia, unspecified: Secondary | ICD-10-CM | POA: Diagnosis not present

## 2019-03-14 DIAGNOSIS — Z79890 Hormone replacement therapy (postmenopausal): Secondary | ICD-10-CM

## 2019-03-14 DIAGNOSIS — Z952 Presence of prosthetic heart valve: Secondary | ICD-10-CM | POA: Diagnosis not present

## 2019-03-14 DIAGNOSIS — E559 Vitamin D deficiency, unspecified: Secondary | ICD-10-CM | POA: Diagnosis not present

## 2019-03-14 NOTE — Progress Notes (Signed)
Metrics: Intervention Frequency ACO  Documented Smoking Status Yearly  Screened one or more times in 24 months  Cessation Counseling or  Active cessation medication Past 24 months  Past 24 months   Guideline developer: UpToDate (See UpToDate for funding source) Date Released: 2014       Wellness Office Visit  Subjective:  Patient ID: Katelyn [name], female    DOB: [birth year]  Age: 79 y.o. MRN: [id number]  CC: This lady comes in for follow-up of atrial fibrillation, hyperlipidemia, hypertension, vitamin D deficiency. HPI  She is doing reasonably well.  Unfortunately, in November, she lost her husband of 36 years through COPD. She is tolerating this reasonably well at the present time. She denies any chest pain or palpitations. She had seen Dr. Claiborne Billings, cardiology, in November of last year and he had recommended to continue with amiodarone, possibly to reduce the dose to 100 mg daily in the future. Past Medical History:  Diagnosis Date  . Allergy    rhinitis  . Aortic stenosis, severe   . Aortic stenosis, severe   . Arthritis    spine and various joints  . Cataract   . Cerebrovascular disease 12/05/2018  . Childhood asthma   . Chronic bronchitis ([place])       . Claudication ([place])   . Coronary artery disease    a. April 2012 which revealed mid RCA occlusion with collaterals, 50% LAD stenosis, 30% circumflex stenosis, and 40% marginal stenosis  . GERD (gastroesophageal reflux disease)   . Heart murmur   . Hyperlipidemia   . Hypertension   . LUMBAR RADICULOPATHY, RIGHT 12/21/2007   Qualifier: Diagnosis of  By: Aline Brochure MD, Dorothyann Peng    . LUMBAR RADICULOPATHY, RIGHT   . Peripheral vascular disease ([place])   . 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 Seroma, postoperative    Left Groin  . Severe aortic valve stenosis 08/31/2014  . Stroke [facility]) 1982 X 2   "a little bit  weaker on the left side since" (08/29/2014)      Family History  Problem Relation Age of Onset  . Heart disease Mother 63       died young  . Varicose Veins Mother   . Stroke Father   . Hypertension Father   . Heart disease Father 57       Aneurysm  . Heart attack Father   . Arthritis Brother   . Stroke Brother   . Diabetes Daughter   . Arthritis Daughter   . Allergic rhinitis Neg Hx   . Angioedema Neg Hx   . Asthma Neg Hx   . Atopy Neg Hx   . Eczema Neg Hx   . Immunodeficiency Neg Hx   . Urticaria Neg Hx     Social History   Social History Narrative   [place] husband in November 2020. Retired,used to work at [employer].Lives alone.         Patient has one daughter, 3 grands   Likes to crochet   Works in [employer].    Social History   Tobacco Use  . Smoking status: Former Smoker    Packs/day: 2.00    Years: 56.00    Pack years: 112.00    Types: Cigarettes    Start date: 03/02/1952    Quit date: 06/01/2010    Years since quitting: 8.7  . Smokeless tobacco: Never Used  .  Tobacco comment: HAD WEANED DOWN TO 1/4 PK A DAY BEFORE D/C  Substance Use Topics  . Alcohol use: No    Alcohol/week: 0.0 standard drinks    Comment: 08/29/2014 "never drank much; quit in the 1990's"    Current Meds  Medication Sig  . amiodarone (PACERONE) 200 MG tablet Take 1 tablet (200 mg total) by mouth daily.  . Cholecalciferol (VITAMIN D3) 50 MCG (2000 UT) capsule Take 1 capsule by mouth daily.   Marland Kitchen estradiol (ESTRACE) 2 MG tablet TAKE 1 TABLET BY MOUTH ONCE DAILY (Patient taking differently: Take 2 mg by mouth daily. )  . furosemide (LASIX) 20 MG tablet Take 1 tablet (20 mg total) by mouth every other day.  . ipratropium (ATROVENT) 0.06 % nasal spray USE 1 SPRAY(S) IN EACH NOSTRIL EVERY 6 HOURS AS NEEDED FOR RUNNY NOSE (Patient taking differently: Place 1 spray into both nostrils every 6 (six) hours as needed for rhinitis. )  . irbesartan (AVAPRO) 150 MG tablet Take 1 tablet by  mouth once daily (Patient taking differently: Take 150 mg by mouth daily. )  . meloxicam (MOBIC) 15 MG tablet Take 1 tablet by mouth once daily (Patient taking differently: Take 15 mg by mouth daily. )  . metoprolol succinate (TOPROL-XL) 100 MG 24 hr tablet Take 1 tablet (100 mg total) by mouth daily.  . nitroGLYCERIN (NITROSTAT) 0.4 MG SL tablet Place 1 tablet (0.4 mg total) under the tongue every 5 (five) minutes as needed for chest pain.  . pantoprazole (PROTONIX) 40 MG tablet TAKE 1 TABLET BY MOUTH ONCE DAILY BEFORE SUPPER (Patient taking differently: Take 40 mg by mouth daily before supper. )  . potassium chloride SA (KLOR-CON) 20 MEQ tablet Take 1 tablet (20 mEq total) by mouth daily.  Marland Kitchen thyroid (NP THYROID) 90 MG tablet Take 1 tablet (90 mg total) by mouth daily.  Marland Kitchen warfarin (COUMADIN) 2.5 MG tablet Take 1 tablet by mouth once daily (Patient taking differently: Take 2.5 mg by mouth daily at 6 PM. )  . [DISCONTINUED] potassium chloride (KLOR-CON) 10 MEQ CR tablet Take 10 mEq by mouth daily as needed (when taking Lasix).       Objective:   Today's Vitals: BP 140/60   Pulse (!) 46   Ht 5' 9.5" (1.765 m)   Wt 170 lb 12.8 oz (77.5 kg)   SpO2 97%   BMI 24.86 kg/m  Vitals with BMI 03/14/2019 02/18/2019 02/18/2019  Height 5' 9.5" - -  Weight 170 lbs 13 oz - -  BMI 81.10 - -  Systolic 315 945 -  Diastolic 60 94 -  Pulse 46 82 76     Physical Exam   She looks systemically well per ventricular rate is somewhat low today.  She does not have any bad side effects from this.  She appears to be clinically in sinus rhythm.  She is alert and orientated without any focal neurological signs.    Assessment   1. Essential hypertension   2. Hyperlipidemia with target LDL less than 70   3. S/P TAVR (transcatheter aortic valve replacement)   4. Vitamin D deficiency disease   5. Current long-term use of postmenopausal hormone replacement therapy       Tests ordered Orders Placed This  Encounter  Procedures  . CMP with eGFR([facility])  . Lipid Panel  . T3, Free  . TSH  . Vitamin D, 25-hydroxy     Plan: 1. Blood work is ordered as above. 2. She will continue  with antihypertensive therapy. 3. She will continue with statin therapy and we will see what the lipid panel shows. 4. She will continue with vitamin D3 supplementation and I will check vitamin D levels. 5. Further recommendations will depend on blood results and I will have her follow-up with Sarah in about 3 months time.   No orders of the defined types were placed in this encounter.   Doree Albee, MD

## 2019-03-15 LAB — COMPLETE METABOLIC PANEL WITH GFR
AG Ratio: 1.5 (calc) (ref 1.0–2.5)
ALT: 8 U/L (ref 6–29)
AST: 15 U/L (ref 10–35)
Albumin: 3.6 g/dL (ref 3.6–5.1)
Alkaline phosphatase (APISO): 83 U/L (ref 37–153)
BUN/Creatinine Ratio: 17 (calc) (ref 6–22)
BUN: 20 mg/dL (ref 7–25)
CO2: 27 mmol/L (ref 20–32)
Calcium: 8.6 mg/dL (ref 8.6–10.4)
Chloride: 106 mmol/L (ref 98–110)
Creat: 1.18 mg/dL — ABNORMAL HIGH (ref 0.60–0.93)
GFR, Est African American: 52 mL/min/{1.73_m2} — ABNORMAL LOW (ref >=60)
GFR, Est Non African American: 45 mL/min/{1.73_m2} — ABNORMAL LOW (ref >=60)
Globulin: 2.4 g/dL (calc) (ref 1.9–3.7)
Glucose, Bld: 94 mg/dL (ref 65–99)
Potassium: 5 mmol/L (ref 3.5–5.3)
Sodium: 139 mmol/L (ref 135–146)
Total Bilirubin: 0.7 mg/dL (ref 0.2–1.2)
Total Protein: 6 g/dL — ABNORMAL LOW (ref 6.1–8.1)

## 2019-03-15 LAB — LIPID PANEL
Cholesterol: 126 mg/dL (ref <200)
HDL: 40 mg/dL — ABNORMAL LOW (ref >=50)
LDL Cholesterol (Calc): 66 mg/dL (calc)
Non-HDL Cholesterol (Calc): 86 mg/dL (calc) (ref <130)
Total CHOL/HDL Ratio: 3.2 (calc) (ref <5.0)
Triglycerides: 114 mg/dL (ref <150)

## 2019-03-15 LAB — T3, FREE: T3, Free: 3.6 pg/mL (ref 2.3–4.2)

## 2019-03-15 LAB — VITAMIN D 25 HYDROXY (VIT D DEFICIENCY, FRACTURES): Vit D, 25-Hydroxy: 33 ng/mL (ref 30–100)

## 2019-03-15 LAB — TSH: TSH: 0.21 mIU/L — ABNORMAL LOW (ref 0.40–4.50)

## 2019-03-19 ENCOUNTER — Encounter: Payer: Self-pay | Admitting: Gastroenterology

## 2019-03-19 NOTE — Progress Notes (Signed)
Primary Care Physician: Doree Albee, MD  Primary Gastroenterologist:  Garfield Cornea, MD  Chief Complaint  Patient presents with  . Gastroesophageal Reflux    doing fine    HPI: STEHANIE [name] is a 79 y.o. female here for follow up.  Patient last seen December 2019.  She has a history of GERD, esophageal dysphagia.  Last colonoscopy 2008 unremarkable.  Patient declines future colonoscopies.  EGD in June 2019, esophagus appeared normal.  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  Small hiatal hernia.  She has lost 25 pounds in the past one year. Taking care of husband on Hospice, COPD. Passed away 01-28-19.  Weight loss stabilized over the past month.  She also had Covid right before Christmas.  Doing better now.  As long as she takes her pantoprazole her reflux is well controlled.  Missed pantoprazole a couple times while husband at hospice house and she had recurrent heartburn.  No significant dysphagia.  Has to take her time eating.  Bowel movements are regular.  No melena or rectal bleeding.  No abdominal pain.   Current Outpatient Medications  Medication Sig Dispense Refill  . amiodarone (PACERONE) 200 MG tablet Take 1 tablet (200 mg total) by mouth daily. 90 tablet 3  . Cholecalciferol (VITAMIN D3) 50 MCG (2000 UT) capsule Take 1 capsule by mouth daily.     Marland Kitchen diltiazem (CARDIZEM CD) 180 MG 24 hr capsule Take 1 capsule (180 mg total) by mouth daily. 90 capsule 3  . estradiol (ESTRACE) 2 MG tablet TAKE 1 TABLET BY MOUTH ONCE DAILY (Patient taking differently: Take 2 mg by mouth daily. ) 90 tablet 0  . furosemide (LASIX) 20 MG tablet Take 1 tablet (20 mg total) by mouth every other day. 30 tablet 6  . ipratropium (ATROVENT) 0.06 % nasal spray USE 1 SPRAY(S) IN EACH NOSTRIL EVERY 6 HOURS AS NEEDED FOR RUNNY NOSE (Patient taking differently: Place 1 spray into both nostrils every 6 (six) hours as needed for rhinitis. ) 15 mL 0  . irbesartan (AVAPRO) 150 MG  tablet Take 1 tablet by mouth once daily (Patient taking differently: Take 150 mg by mouth daily. ) 90 tablet 0  . meloxicam (MOBIC) 15 MG tablet Take 1 tablet by mouth once daily (Patient taking differently: Take 15 mg by mouth daily. ) 30 tablet 3  . metoprolol succinate (TOPROL-XL) 100 MG 24 hr tablet Take 1 tablet (100 mg total) by mouth daily. 90 tablet 0  . nitroGLYCERIN (NITROSTAT) 0.4 MG SL tablet Place 1 tablet (0.4 mg total) under the tongue every 5 (five) minutes as needed for chest pain. 25 tablet 6  . pantoprazole (PROTONIX) 40 MG tablet TAKE 1 TABLET BY MOUTH ONCE DAILY BEFORE SUPPER (Patient taking differently: Take 40 mg by mouth daily before supper. ) 90 tablet 0  . potassium chloride SA (KLOR-CON) 20 MEQ tablet Take 1 tablet (20 mEq total) by mouth daily. 3 tablet 0  . rosuvastatin (CRESTOR) 20 MG tablet TAKE 1 TABLET BY MOUTH ONCE DAILY (Patient taking differently: Take 20 mg by mouth daily. ) 90 tablet 0  . thyroid (NP THYROID) 90 MG tablet Take 1 tablet (90 mg total) by mouth daily. 30 tablet 3  . warfarin (COUMADIN) 2.5 MG tablet Take 1 tablet by mouth once daily (Patient taking differently: Take 2.5 mg by mouth daily at 6 PM. ) 30 tablet 3   No current facility-administered medications for this visit.  Allergies as of 03/20/2019 - Review Complete 03/20/2019  Allergen Reaction Noted  . Adhesive [tape] Rash 09/19/2010  . Latex Rash 09/19/2010  . Tetanus toxoids Rash 08/29/2014    ROS:  General: Negative for anorexia, weight loss, fever, chills, fatigue, weakness. ENT: Negative for hoarseness, difficulty swallowing , nasal congestion. CV: Negative for chest pain, angina, palpitations, dyspnea on exertion, peripheral edema.  Respiratory: Negative for dyspnea at rest, dyspnea on exertion, cough, sputum, wheezing.  GI: See history of present illness. GU:  Negative for dysuria, hematuria, urinary incontinence, urinary frequency, nocturnal urination.  Endo: Negative for  unusual weight change.    Physical Examination:   BP 136/66   Pulse 78   Temp (!) 97.1 F (36.2 C) (Oral)   Ht 5' 9.5" (1.765 m)   Wt 170 lb 12.8 oz (77.5 kg)   BMI 24.86 kg/m   General: Well-nourished, well-developed in no acute distress.  Eyes: No icterus. Mouth:masked  Abdomen: Bowel sounds are normal, nontender, nondistended, no hepatosplenomegaly or masses, no abdominal bruits or hernia , no rebound or guarding.   Extremities: No lower extremity edema. No clubbing or deformities. Neuro: Alert and oriented x 4   Skin: Warm and dry, no jaundice.   Psych: Alert and cooperative, normal mood and affect.  Labs:  Lab Results  Component Value Date   CREATININE 1.18 (H) 03/14/2019   BUN 20 03/14/2019   NA 139 03/14/2019   K 5.0 03/14/2019   CL 106 03/14/2019   CO2 27 03/14/2019   Lab Results  Component Value Date   ALT 8 03/14/2019   AST 15 03/14/2019   ALKPHOS 72 02/18/2019   BILITOT 0.7 03/14/2019   Lab Results  Component Value Date   WBC 2.9 (L) 02/18/2019   HGB 16.8 (H) 02/18/2019   HCT 52.0 (H) 02/18/2019   MCV 93.0 02/18/2019   PLT 99 (L) 02/18/2019      Imaging Studies: No results found.

## 2019-03-20 ENCOUNTER — Other Ambulatory Visit: Payer: Self-pay

## 2019-03-20 ENCOUNTER — Other Ambulatory Visit (INDEPENDENT_AMBULATORY_CARE_PROVIDER_SITE_OTHER): Payer: Self-pay | Admitting: Nurse Practitioner

## 2019-03-20 ENCOUNTER — Telehealth: Payer: Self-pay | Admitting: Gastroenterology

## 2019-03-20 ENCOUNTER — Encounter: Payer: Self-pay | Admitting: Gastroenterology

## 2019-03-20 ENCOUNTER — Ambulatory Visit (INDEPENDENT_AMBULATORY_CARE_PROVIDER_SITE_OTHER): Payer: Medicare Other | Admitting: Gastroenterology

## 2019-03-20 VITALS — BP 136/66 | HR 78 | Temp 97.1°F | Ht 69.5 in | Wt 170.8 lb

## 2019-03-20 DIAGNOSIS — K219 Gastro-esophageal reflux disease without esophagitis: Secondary | ICD-10-CM | POA: Diagnosis not present

## 2019-03-20 DIAGNOSIS — D696 Thrombocytopenia, unspecified: Secondary | ICD-10-CM | POA: Diagnosis not present

## 2019-03-20 LAB — POCT INR: INR: 2.8 (ref 2.0–3.0)

## 2019-03-20 NOTE — Assessment & Plan Note (Signed)
Chronic intermittent thrombocytopenia.  Noted after patient left the office.  We will contact the patient to see if she is ever seen hematology for this as well as offer ultrasound to rule out splenomegaly/cirrhosis.

## 2019-03-20 NOTE — Patient Instructions (Signed)
1. Continue pantoprazole 40 mg daily. 2. We will see back in 2 years.  Call sooner if you need anything.

## 2019-03-20 NOTE — Telephone Encounter (Signed)
What about the u/s? Did she want [place] to complete that?

## 2019-03-20 NOTE — Telephone Encounter (Signed)
Lmom, waiting on a return call.  

## 2019-03-20 NOTE — Telephone Encounter (Signed)
Per pt, she isn't able to do an u/s at this time either. I asked pt if she could call us when she was ready to schedule and she said, she would call back.

## 2019-03-20 NOTE — Telephone Encounter (Signed)
Pt returned call. Pt hasn't seen a hematologist in the past and isn't interested at this time in seeing a hematologist.

## 2019-03-20 NOTE — Telephone Encounter (Signed)
Please touch base with patient and find out if she has ever seen hematologist for chronic low platelets.  After she left the office and noted that she has had low platelets off and on for several years.  At a minimum, would recommend abdominal ultrasound to look at her liver and spleen to see if this is the source of her low platelets.  If normal, then would consider at least one-time consultation with hematologist.  Let me know if patient is in agreement.

## 2019-03-20 NOTE — Assessment & Plan Note (Signed)
Doing well on pantoprazole 40 mg daily.  Return to the office in 2 years or call sooner if needed.

## 2019-03-20 NOTE — Telephone Encounter (Signed)
Noted  

## 2019-03-20 NOTE — Progress Notes (Signed)
Cc'ed to pcp °

## 2019-03-20 NOTE — Telephone Encounter (Signed)
Noted! Thank you

## 2019-03-23 ENCOUNTER — Telehealth (INDEPENDENT_AMBULATORY_CARE_PROVIDER_SITE_OTHER): Payer: Self-pay | Admitting: Nurse Practitioner

## 2019-03-23 NOTE — Telephone Encounter (Signed)
I called patient today to see what her INR was this week. She tells me it was 2.8. I encouraged her to continue taking her dose of warfarin as prescribed. This is goal. She tells me she understands.

## 2019-03-27 ENCOUNTER — Other Ambulatory Visit (INDEPENDENT_AMBULATORY_CARE_PROVIDER_SITE_OTHER): Payer: Self-pay | Admitting: Internal Medicine

## 2019-03-27 DIAGNOSIS — Z7901 Long term (current) use of anticoagulants: Secondary | ICD-10-CM | POA: Diagnosis not present

## 2019-03-27 LAB — POCT INR: INR: 2.7 (ref 2.0–3.0)

## 2019-04-03 ENCOUNTER — Telehealth (INDEPENDENT_AMBULATORY_CARE_PROVIDER_SITE_OTHER): Payer: Self-pay | Admitting: Nurse Practitioner

## 2019-04-03 LAB — POCT INR: INR: 2.6 (ref 2.0–3.0)

## 2019-04-03 NOTE — Telephone Encounter (Signed)
Advised patient of INR results with verbal understanding. Patient aware to have INR rechecked again next week.

## 2019-04-03 NOTE — Telephone Encounter (Signed)
Please call this patient and let her know that we received her INR results (2.6). Let her know she should continue on the same dose of warfarin. No changes are needed at this time. Remind her to check her INR again next week. Thank you.

## 2019-04-04 ENCOUNTER — Ambulatory Visit: Payer: Medicare Other

## 2019-04-04 ENCOUNTER — Inpatient Hospital Stay (HOSPITAL_COMMUNITY): Admission: RE | Admit: 2019-04-04 | Payer: Medicare Other | Source: Ambulatory Visit

## 2019-04-10 LAB — POCT INR

## 2019-04-13 ENCOUNTER — Other Ambulatory Visit: Payer: Self-pay

## 2019-04-13 ENCOUNTER — Telehealth (HOSPITAL_COMMUNITY): Payer: Self-pay

## 2019-04-13 DIAGNOSIS — I739 Peripheral vascular disease, unspecified: Secondary | ICD-10-CM

## 2019-04-13 NOTE — Telephone Encounter (Signed)

## 2019-04-14 ENCOUNTER — Ambulatory Visit (HOSPITAL_COMMUNITY)
Admission: RE | Admit: 2019-04-14 | Discharge: 2019-04-14 | Disposition: A | Discharge status: Home / Self Care | Payer: Medicare Other | Source: Ambulatory Visit | Attending: Vascular Surgery | Admitting: Vascular Surgery

## 2019-04-14 ENCOUNTER — Other Ambulatory Visit: Payer: Self-pay

## 2019-04-14 ENCOUNTER — Ambulatory Visit (INDEPENDENT_AMBULATORY_CARE_PROVIDER_SITE_OTHER): Payer: Medicare Other | Admitting: Physician Assistant

## 2019-04-14 VITALS — BP 155/67 | HR 43 | Temp 96.8°F | Resp 14 | Ht 69.5 in | Wt 170.0 lb

## 2019-04-14 DIAGNOSIS — I7409 Other arterial embolism and thrombosis of abdominal aorta: Secondary | ICD-10-CM | POA: Diagnosis not present

## 2019-04-14 DIAGNOSIS — I872 Venous insufficiency (chronic) (peripheral): Secondary | ICD-10-CM

## 2019-04-14 DIAGNOSIS — I739 Peripheral vascular disease, unspecified: Secondary | ICD-10-CM | POA: Insufficient documentation

## 2019-04-14 NOTE — Progress Notes (Signed)
Office Note     CC:  follow up Requesting Provider:  Doree Albee, MD  HPI: Katelyn [name] is a 79 y.o. ([birth year]) female who is status post aortobifemoral bypass graft in April of 2012 by Dr. Trula Slade.  She is not having any lower extremity claudication symptoms, rest pain, weakness, or numbness at this time. She has no non healing wounds. She does continue to have some swelling in bilateral lower extremities which is not disabling. She states it improves with elevation. She continues to wear compression stockings almost daily.  Her last CTA evaluation of her aortobifemoral bypass graft was in 2016 at which time it was stable and widely patent. She denies any abdominal pain, back pain, weakness of her lower extremities  She follows up with her cardiologist for Atrial fibrillation on Coumadin and her Cardiologist also monitors her carotid arteries. She has not had any stroke like symptoms.  The pt on a statin for cholesterol management.  The pt is not on a daily aspirin.   Other AC:  Coumadin The pt is not diabetic.  Tobacco hx:  Former smoker  Past Medical History:  Diagnosis Date  . Allergy    rhinitis  . Aortic stenosis, severe   . Aortic stenosis, severe   . Arthritis    spine and various joints  . Cataract   . Cerebrovascular disease 12/05/2018  . Childhood asthma   . Chronic bronchitis ([place])       . Claudication ([place])   . Coronary artery disease    a. April 2012 which revealed mid RCA occlusion with collaterals, 50% LAD stenosis, 30% circumflex stenosis, and 40% marginal stenosis  . GERD (gastroesophageal reflux disease)   . Heart murmur   . Hyperlipidemia   . Hypertension   . LUMBAR RADICULOPATHY, RIGHT 12/21/2007   Qualifier: Diagnosis of  By: Aline Brochure MD, Dorothyann Peng    . LUMBAR RADICULOPATHY, RIGHT   . Peripheral vascular disease ([place])   . 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 Seroma, postoperative    Left Groin  . Severe aortic valve stenosis 08/31/2014  . Stroke [facility]) 1982 X 2   "a little bit weaker on the left side since" (08/29/2014)    Past Surgical History:  Procedure Laterality Date  . AORTO-FEMORAL BYPASS GRAFT Bilateral 06/27/10  . BASAL CELL CARCINOMA EXCISION Left    face  . CARDIAC CATHETERIZATION N/A 07/25/2014   Procedure: Right/Left Heart Cath and Coronary Angiography;  Surgeon: Troy Sine, MD;  Location: [facility];  Service: Cardiovascular;  Laterality: N/A;  . CATARACT EXTRACTION, BILATERAL Bilateral 09/29/2016  . COLONOSCOPY  2008   Dr. Laural Golden: normal.   . DILATION AND CURETTAGE OF UTERUS  "2 or 3"  . ESOPHAGOGASTRODUODENOSCOPY N/A 08/18/2017   Dr. Gala Romney: Normal-appearing esophagus status post dilation.  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  Small hiatal hernia.  Marland Kitchen MALONEY DILATION N/A 08/18/2017   Procedure: Venia Minks DILATION;  Surgeon: Daneil Dolin, MD;  [facility];  Service: Endoscopy;  Laterality: N/A;  . MULTIPLE TOOTH EXTRACTIONS    . PR VEIN BYPASS GRAFT,AORTO-FEM-POP  06/12/10  . TEE WITHOUT CARDIOVERSION N/A 08/31/2014   Procedure: TRANSESOPHAGEAL ECHOCARDIOGRAM (TEE);  Surgeon: Rexene Alberts, MD;  Location: [facility];  Service: Open Heart Surgery;  Laterality: N/A;  . TRANSCATHETER AORTIC VALVE REPLACEMENT, TRANSFEMORAL N/A 08/31/2014   Procedure: TRANSCATHETER AORTIC VALVE  REPLACEMENT, TRANSFEMORAL approach;  Surgeon: Rexene Alberts, MD;  Location: [facility];  Service: Open Heart Surgery;  Laterality: N/A;  . TUBAL LIGATION  1970's  . VAGINAL HYSTERECTOMY  1970's   Partial     Social History   Socioeconomic History  . Marital status: Married    Spouse name: bobby  . Number of children: 1  . Years of education: Not on file  . Highest education level: High school graduate  Occupational History  . 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  Smoking status: Former Smoker    Packs/day: 2.00    Years: 56.00    Pack years: 112.00    Types: Cigarettes    Start date: 03/02/1952    Quit date: 06/01/2010    Years since quitting: 8.8  . Smokeless tobacco: Never Used  . Tobacco comment: HAD WEANED DOWN TO 1/4 PK A DAY BEFORE D/C  Substance and Sexual Activity  . Alcohol use: No    Alcohol/week: 0.0 standard drinks    Comment: 08/29/2014 "never drank much; quit in the 1990's"  . Drug use: No  . Sexual activity: Yes    Partners: Male  Other Topics Concern  . Not on file  Social History Narrative   [place] husband in November 2020. Retired,used to work at [employer].Lives alone.         Patient has one daughter, 3 grands   Likes to crochet   Works in [employer].    Social Determinants of Health   Financial Resource Strain:   . Difficulty of Paying Living Expenses: Not on file  Food Insecurity:   . Worried About Charity fundraiser in the Last Year: Not on file  . Ran Out of Food in the Last Year: Not on file  Transportation Needs:   . Lack of Transportation (Medical): Not on file  . Lack of Transportation (Non-Medical): Not on file  Physical Activity:   . Days of Exercise per Week: Not on file  . Minutes of Exercise per Session: Not on file  Stress:   . Feeling of Stress : Not on file  Social Connections:   . Frequency of Communication with Friends and Family: Not on file  . Frequency of Social Gatherings with Friends and Family: Not on file  . Attends Religious Services: Not on file  . Active Member of Clubs or Organizations: Not on file  . Attends Archivist Meetings: Not on file  . Marital Status: Not on file  Intimate Partner Violence:   . Fear of Current or Ex-Partner: Not on file  . Emotionally Abused: Not on file  . Physically Abused: Not on file  . Sexually Abused: Not on file    Family History  Problem Relation Age of Onset  . Heart disease Mother 43       died young  . Varicose  Veins Mother   . Stroke Father   . Hypertension Father   . Heart disease Father 16       Aneurysm  . Heart attack Father   . Arthritis Brother   . Stroke Brother   . Diabetes Daughter   . Arthritis Daughter   . Allergic rhinitis Neg Hx   . Angioedema Neg Hx   . Asthma Neg Hx   . Atopy Neg Hx   . Eczema Neg Hx   . Immunodeficiency Neg Hx   . Urticaria Neg Hx     Current  Outpatient Medications  Medication Sig Dispense Refill  . amiodarone (PACERONE) 200 MG tablet Take 1 tablet (200 mg total) by mouth daily. 90 tablet 3  . Cholecalciferol (VITAMIN D3) 50 MCG (2000 UT) capsule Take 1 capsule by mouth daily.     Marland Kitchen estradiol (ESTRACE) 2 MG tablet TAKE 1 TABLET BY MOUTH ONCE DAILY (Patient taking differently: Take 2 mg by mouth daily. ) 90 tablet 0  . furosemide (LASIX) 20 MG tablet Take 1 tablet (20 mg total) by mouth every other day. 30 tablet 6  . ipratropium (ATROVENT) 0.06 % nasal spray Place 1 spray into both nostrils every 6 (six) hours as needed for rhinitis. 15 mL 3  . irbesartan (AVAPRO) 150 MG tablet Take 1 tablet by mouth once daily (Patient taking differently: Take 150 mg by mouth daily. ) 90 tablet 0  . meloxicam (MOBIC) 15 MG tablet Take 1 tablet by mouth once daily (Patient taking differently: Take 15 mg by mouth daily. ) 30 tablet 3  . metoprolol succinate (TOPROL-XL) 100 MG 24 hr tablet Take 1 tablet (100 mg total) by mouth daily. 90 tablet 0  . nitroGLYCERIN (NITROSTAT) 0.4 MG SL tablet Place 1 tablet (0.4 mg total) under the tongue every 5 (five) minutes as needed for chest pain. 25 tablet 6  . pantoprazole (PROTONIX) 40 MG tablet TAKE 1 TABLET BY MOUTH ONCE DAILY BEFORE SUPPER (Patient taking differently: Take 40 mg by mouth daily before supper. ) 90 tablet 0  . potassium chloride SA (KLOR-CON) 20 MEQ tablet Take 1 tablet (20 mEq total) by mouth daily. 3 tablet 0  . rosuvastatin (CRESTOR) 20 MG tablet Take 1 tablet (20 mg total) by mouth at bedtime. 90 tablet 0  .  thyroid (NP THYROID) 90 MG tablet Take 1 tablet (90 mg total) by mouth daily. 30 tablet 3  . warfarin (COUMADIN) 2.5 MG tablet Take 1 tablet by mouth once daily (Patient taking differently: Take 2.5 mg by mouth daily at 6 PM. ) 30 tablet 3  . diltiazem (CARDIZEM CD) 180 MG 24 hr capsule Take 1 capsule (180 mg total) by mouth daily. 90 capsule 3   No current facility-administered medications for this visit.    Allergies  Allergen Reactions  . Adhesive [Tape] Rash  . Latex Rash  . Tetanus Toxoids Rash     REVIEW OF SYSTEMS:  [X]  denotes positive finding, [ ]  denotes negative finding Cardiac  Comments:  Chest pain or chest pressure:    Shortness of breath upon exertion: x She states that occasionally when going up an incline she will become short winded but this has been present for several years and is not worsening  Short of breath when lying flat:    Irregular heart rhythm:        Vascular    Pain in calf, thigh, or hip brought on by ambulation:    Pain in feet at night that wakes you up from your sleep:     Blood clot in your veins:    Leg swelling:         Pulmonary    Oxygen at home:    Productive cough:     Wheezing:         Neurologic    Sudden weakness in arms or legs:     Sudden numbness in arms or legs:     Sudden onset of difficulty speaking or slurred speech:    Temporary loss of vision in one eye:  Problems with dizziness:         Gastrointestinal    Blood in stool:     Vomited blood:         Genitourinary    Burning when urinating:     Blood in urine:        Psychiatric    Major depression:         Hematologic    Bleeding problems:    Problems with blood clotting too easily:        Skin    Rashes or ulcers:        Constitutional    Fever or chills:      PHYSICAL EXAMINATION:  Vitals:   04/14/19 1352  BP: (!) 155/67  Pulse: (!) 43  Resp: 14  Temp: (!) 96.8 F (36 C)  TempSrc: Temporal  SpO2: 99%  Weight: 170 lb (77.1 kg)   Height: 5' 9.5" (1.765 m)    General: Pleasant, well nourished, well appearing Gait: Normal HENT: WNL, normocephalic Pulmonary: normal non-labored breathing , without wheezing Cardiac: regular HR, without  Murmurs without carotid bruit Abdomen: soft, NT, no masses Skin: without rashes Vascular Exam/Pulses:  Right Left  Radial 2+ (normal) 2+ (normal)  Ulnar 2+ (normal) 2+ (normal)  Femoral 2+ (normal) 2+ (normal)  Popliteal 2+ (normal) 2+ (normal)  DP 2+ (normal) 2+ (normal)  PT 2+ (normal) 2+ (normal)   Extremities: without ischemic changes, without Gangrene , without cellulitis; without open wounds; bilateral mild edema, multiple varicosities and spider veins of bilateral lower legs, no tenderness, no erythema. R foot warm. L foot warm Musculoskeletal: no muscle wasting or atrophy  Neurologic: A&O X 3;  No focal weakness or paresthesias are detected Psychiatric:  The pt has Normal affect.   Non-Invasive Vascular Imaging:    ABI Findings:  +---------+------------------+-----+----------+--------+  Right  Rt Pressure (mmHg)IndexWaveform Comment   +---------+------------------+-----+----------+--------+  Brachial 177                      +---------+------------------+-----+----------+--------+  PTA   199        1.12 biphasic       +---------+------------------+-----+----------+--------+  DP    167        0.94 monophasic      +---------+------------------+-----+----------+--------+  Great Toe174        0.98            +---------+------------------+-----+----------+--------+   +---------+------------------+-----+--------+-------+  Left   Lt Pressure (mmHg)IndexWaveformComment  +---------+------------------+-----+--------+-------+  Brachial 165                     +---------+------------------+-----+--------+-------+  PTA   200        1.13  biphasic      +---------+------------------+-----+--------+-------+  DP    175        0.99 biphasic      +---------+------------------+-----+--------+-------+  Great Toe178        1.01           +---------+------------------+-----+--------+-------+   +-------+-----------+-----------+------------+------------+  ABI/TBIToday's ABIToday's TBIPrevious ABIPrevious TBI  +-------+-----------+-----------+------------+------------+  Right 1.12    0.98    1.22    0.84      +-------+-----------+-----------+------------+------------+  Left  1.13    1.01    1.24    0.75      +-------+-----------+-----------+------------+------------+   Preivous ABI on 03/31/18.    Summary:  Right: Resting right ankle-brachial index is within normal range. The  right toe-brachial index is normal. RT great toe pressure = 174  mmHg.   Left: Resting left ankle-brachial index is within normal range. LT Great  toe pressure = 178 mmHg.    ASSESSMENT/PLAN:: 79 y.o. female here for follow up for peripheral vascular disease follow up. She is s/p aortobifemoral bypass graft in April of 2012. She has no lower extremity rest pain, claudication or nonhealing wounds. She is able to do her activities of daily living without any limitations. Her ABI's today remain stable. She does likely have chronic venous insufficiency. She currently elevates and uses compression stockings almost daily. She is okay with continuing these conservative measures at this time. She will continue her Statin and Coumadin. She has follow up next month with her Cardiologist for her carotid disease.  I discussed with her lower extremity occlusive symptoms and symptoms should they occur she should go to the Emergency room  She will follow up in one year with CTA abdomen and pelvis for evaluation of her aortobifemoral bypass graft and ABI's.     Karoline Caldwell, PA-C  [facility] [phone]  Clinic MD:   Donzetta Matters

## 2019-04-17 ENCOUNTER — Encounter: Payer: Self-pay | Admitting: Nurse Practitioner

## 2019-04-17 ENCOUNTER — Telehealth (INDEPENDENT_AMBULATORY_CARE_PROVIDER_SITE_OTHER): Payer: Self-pay | Admitting: Nurse Practitioner

## 2019-04-17 LAB — POCT INR: INR: 2.2 (ref 2.0–3.0)

## 2019-04-17 NOTE — Telephone Encounter (Signed)
This patient's INR level is 2.2 this week.  Nellie, please call this patient and notify her that she should continue the same dose of her coumadin. Thank you.

## 2019-04-18 ENCOUNTER — Other Ambulatory Visit: Payer: Self-pay | Admitting: *Deleted

## 2019-04-18 DIAGNOSIS — I739 Peripheral vascular disease, unspecified: Secondary | ICD-10-CM

## 2019-04-24 ENCOUNTER — Encounter: Payer: Self-pay | Admitting: Nurse Practitioner

## 2019-04-24 ENCOUNTER — Telehealth (INDEPENDENT_AMBULATORY_CARE_PROVIDER_SITE_OTHER): Payer: Self-pay

## 2019-04-24 DIAGNOSIS — Z7901 Long term (current) use of anticoagulants: Secondary | ICD-10-CM | POA: Diagnosis not present

## 2019-04-24 LAB — POCT INR: INR: 2 (ref 2.0–3.0)

## 2019-04-24 NOTE — Telephone Encounter (Signed)
INR results from 04/24/19 is 2.0  Please advise of any changes

## 2019-04-24 NOTE — Telephone Encounter (Signed)
Continue with same dose of Coumadin,thanks.

## 2019-04-25 NOTE — Telephone Encounter (Signed)
Pt  was called and given the instructions on coumadin for the week.

## 2019-05-01 ENCOUNTER — Telehealth (INDEPENDENT_AMBULATORY_CARE_PROVIDER_SITE_OTHER): Payer: Self-pay | Admitting: Nurse Practitioner

## 2019-05-01 ENCOUNTER — Encounter: Payer: Self-pay | Admitting: Nurse Practitioner

## 2019-05-01 LAB — POCT INR: INR: 2.9 (ref 2.0–3.0)

## 2019-05-01 NOTE — Telephone Encounter (Signed)
Patient's INR came back and it is 2.9. This is at goal. Katelyn [name], please call this patient and let her know that she can continue on the same dose of her coumadin. Thank you.

## 2019-05-02 NOTE — Telephone Encounter (Signed)
Called Katelyn [name]. Pt was advised of instruction to continue same dose.

## 2019-05-08 LAB — PROTIME-INR: INR: 3.7 — AB (ref 0.9–1.1)

## 2019-05-12 ENCOUNTER — Other Ambulatory Visit (INDEPENDENT_AMBULATORY_CARE_PROVIDER_SITE_OTHER): Payer: Self-pay | Admitting: Internal Medicine

## 2019-05-15 ENCOUNTER — Telehealth (INDEPENDENT_AMBULATORY_CARE_PROVIDER_SITE_OTHER): Payer: Self-pay | Admitting: Nurse Practitioner

## 2019-05-15 LAB — PROTIME-INR: INR: 2.2 — AB (ref 0.9–1.1)

## 2019-05-15 NOTE — Telephone Encounter (Signed)
Patient is aware of the recommendation 

## 2019-05-15 NOTE — Telephone Encounter (Signed)
Please call this patient and notify her that her INR was within goal (2.2; goal is 2.0-3.0).  Thus, she can continue taking her current dose of Coumadin.  Thank you.

## 2019-05-17 NOTE — Addendum Note (Signed)
Addended by: Jeralyn Ruths E on: 05/17/2019 10:11 AM   Modules accepted: Miquel Dunn

## 2019-05-22 ENCOUNTER — Encounter: Payer: Self-pay | Admitting: Nurse Practitioner

## 2019-05-22 DIAGNOSIS — Z7901 Long term (current) use of anticoagulants: Secondary | ICD-10-CM | POA: Diagnosis not present

## 2019-05-22 LAB — POCT INR: INR: 2.1 (ref 2.0–3.0)

## 2019-05-23 ENCOUNTER — Telehealth (INDEPENDENT_AMBULATORY_CARE_PROVIDER_SITE_OTHER): Payer: Self-pay

## 2019-05-23 MED ORDER — MELOXICAM 15 MG PO TABS: 15.0000 mg | ORAL_TABLET | Freq: Every day | ORAL | 3 refills | Status: DC

## 2019-05-23 MED ORDER — WARFARIN SODIUM 2.5 MG PO TABS: 2.5000 mg | ORAL_TABLET | Freq: Every day | ORAL | 3 refills | Status: DC

## 2019-05-23 NOTE — Telephone Encounter (Signed)
LeighAnn Madelynne Lasker, CMA  

## 2019-05-29 ENCOUNTER — Ambulatory Visit (INDEPENDENT_AMBULATORY_CARE_PROVIDER_SITE_OTHER): Payer: Self-pay | Admitting: Nurse Practitioner

## 2019-05-29 LAB — POCT INR: INR: 2.4 (ref 2–3)

## 2019-05-29 NOTE — Progress Notes (Signed)
05/29/19 @ 0920: Patient's INR result is 2.4 today.  This is within goal.  She will be told to continue current dose of Coumadin.  Serena Colonel, please call this patient and let her know that she can continue her current dose of Coumadin.  Thank you.

## 2019-05-29 NOTE — Progress Notes (Signed)
Patient is aware of results & recommendation

## 2019-06-05 ENCOUNTER — Telehealth (INDEPENDENT_AMBULATORY_CARE_PROVIDER_SITE_OTHER): Payer: Self-pay

## 2019-06-05 LAB — POCT INR: INR: 2.3 (ref 2–3)

## 2019-06-06 ENCOUNTER — Other Ambulatory Visit (HOSPITAL_COMMUNITY): Payer: Medicare Other

## 2019-06-06 NOTE — Telephone Encounter (Signed)
Pt was called. No answer;left voicemail and was to continue the same dose of warfarin. No changes at this time.

## 2019-06-06 NOTE — Telephone Encounter (Signed)
Continue with the same dose of warfarin.  INR is at goal.

## 2019-06-07 ENCOUNTER — Encounter: Payer: Self-pay | Admitting: Physician Assistant

## 2019-06-07 ENCOUNTER — Telehealth: Payer: Self-pay

## 2019-06-07 ENCOUNTER — Telehealth (INDEPENDENT_AMBULATORY_CARE_PROVIDER_SITE_OTHER): Payer: Medicare Other | Admitting: Physician Assistant

## 2019-06-07 VITALS — Ht 70.0 in | Wt 176.0 lb

## 2019-06-07 DIAGNOSIS — Z952 Presence of prosthetic heart valve: Secondary | ICD-10-CM

## 2019-06-07 DIAGNOSIS — I2510 Atherosclerotic heart disease of native coronary artery without angina pectoris: Secondary | ICD-10-CM

## 2019-06-07 DIAGNOSIS — Z87891 Personal history of nicotine dependence: Secondary | ICD-10-CM

## 2019-06-07 DIAGNOSIS — Z8673 Personal history of transient ischemic attack (TIA), and cerebral infarction without residual deficits: Secondary | ICD-10-CM

## 2019-06-07 DIAGNOSIS — E785 Hyperlipidemia, unspecified: Secondary | ICD-10-CM | POA: Diagnosis not present

## 2019-06-07 DIAGNOSIS — I480 Paroxysmal atrial fibrillation: Secondary | ICD-10-CM

## 2019-06-07 DIAGNOSIS — I10 Essential (primary) hypertension: Secondary | ICD-10-CM | POA: Diagnosis not present

## 2019-06-07 MED ORDER — METOPROLOL SUCCINATE ER 100 MG PO TB24: 100.0000 mg | ORAL_TABLET | Freq: Every day | ORAL | 3 refills | Status: DC

## 2019-06-07 MED ORDER — DILTIAZEM HCL ER COATED BEADS 180 MG PO CP24: 180.0000 mg | ORAL_CAPSULE | Freq: Every day | ORAL | 3 refills | Status: DC

## 2019-06-07 MED ORDER — IRBESARTAN 150 MG PO TABS: 150.0000 mg | ORAL_TABLET | Freq: Every day | ORAL | 3 refills | Status: DC

## 2019-06-07 NOTE — Progress Notes (Signed)
Virtual Visit via Telephone Note   This visit type was conducted due to national recommendations for restrictions regarding the COVID-19 Pandemic (e.g. social distancing) in an effort to limit this patient's exposure and mitigate transmission in our community.  Due to her co-morbid illnesses, this patient is at least at moderate risk for complications without adequate follow up.  This format is felt to be most appropriate for this patient at this time.  The patient did not have access to video technology/had technical difficulties with video requiring transitioning to audio format only (telephone).  All issues noted in this document were discussed and addressed.  No physical exam could be performed with this format.  Please refer to the patient's chart for her  consent to telehealth for [facility].   The patient was identified using 2 identifiers.  Date:  06/09/2019   ID:  Katelyn [name], DOB 1940-12-09, MRN [id number]  Patient Location: Home Provider Location: Home  PCP:  Doree Albee, MD  Cardiologist:  Shelva Majestic, MD Electrophysiologist:  None   Evaluation Performed:  Follow-Up Visit  Chief Complaint:  followup  History of Present Illness:    Katelyn [name] is a 79 y.o. female with past medical history of CAD, aortic stenosis s/p TAVR, PAD, PAF, hypertension, hyperlipidemia, history of CVA.  Previous cardiac catheterization in 2012 revealed mid RCA occlusion with collaterals, 30% LAD, 30% left circumflex, 40% marginal stenosis.  She has documented occlusion of infrarenal abdominal aorta and underwent aortobifemoral bypass surgery and bilateral fundoplasty by Dr. Trula Slade.  Repeat cardiac catheterization performed in May 2016 showed chronically occluded RCA with left-to-right collaterals, 70% diagonal lesion, 50% ostial left circumflex lesion, 40% AV groove stenosis.  Her aortic valve stenosis has further progressed.  She eventually underwent TAVR on 08/31/2014 with 26 mm Edwards  sapient III valve by Dr. Angelena Form and Dr. Roxy Manns.  During the hospitalization, she did develop postoperative atrial fibrillation recur transient amiodarone therapy.  Follow-up echocardiogram obtained 2 months later showed stable aortic valve with trivial AI, EF 60 to 65%.  She had recurrent atrial fibrillation in January 2018 and was initiated on Coumadin therapy.  Amiodarone therapy was later restarted.  Most recent echocardiogram obtained on 05/19/2018 showed EF 60 to 65%, stable bioprosthetic aortic valve.  Patient was last seen by Dr. Claiborne Billings November 2020 at which time she was doing well.  She was taking Lasix every other day.  Patient was admitted for orthostatic hypotension in December 2020, during the ED visit, it was noted she went back into atrial fibrillation.  Patient presents today for virtual visit.  She denies any chest pain or shortness of breath.  She has no lower extremity edema, orthopnea or PND.  Overall she has been doing well.  I will refill her diltiazem, irbesartan and metoprolol.  She is due for repeat echocardiogram for her bioprosthetic aortic valve.  I also recommend a nursing visit with EKG as well to double check if he is still in atrial fibrillation.  Previous EKG demonstrated patient has went back into A. fib in December 2020 during ED visit.  The patient does not have symptoms concerning for COVID-19 infection (fever, chills, cough, or new shortness of breath).    Past Medical History:  Diagnosis Date  . Allergy    rhinitis  . Aortic stenosis, severe   . Aortic stenosis, severe   . Arthritis    spine and various joints  . Cataract   . Cerebrovascular disease 12/05/2018  . Childhood  asthma   . Chronic bronchitis ([place])       . Claudication ([place])   . Coronary artery disease    a. April 2012 which revealed mid RCA occlusion with collaterals, 50% LAD stenosis, 30% circumflex stenosis, and 40% marginal stenosis  . GERD (gastroesophageal reflux disease)   . Heart murmur     . Hyperlipidemia   . Hypertension   . LUMBAR RADICULOPATHY, RIGHT 12/21/2007   Qualifier: Diagnosis of  By: Aline Brochure MD, Dorothyann Peng    . LUMBAR RADICULOPATHY, RIGHT   . Peripheral vascular disease ([place])   . 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 Seroma, postoperative    Left Groin  . Severe aortic valve stenosis 08/31/2014  . Stroke [facility]) 1982 X 2   "a little bit weaker on the left side since" (08/29/2014)   Past Surgical History:  Procedure Laterality Date  . AORTO-FEMORAL BYPASS GRAFT Bilateral 06/27/10  . BASAL CELL CARCINOMA EXCISION Left    face  . CARDIAC CATHETERIZATION N/A 07/25/2014   Procedure: Right/Left Heart Cath and Coronary Angiography;  Surgeon: Troy Sine, MD;  Location: [facility];  Service: Cardiovascular;  Laterality: N/A;  . CATARACT EXTRACTION, BILATERAL Bilateral 09/29/2016  . COLONOSCOPY  2008   Dr. Laural Golden: normal.   . DILATION AND CURETTAGE OF UTERUS  "2 or 3"  . ESOPHAGOGASTRODUODENOSCOPY N/A 08/18/2017   Dr. Gala Romney: Normal-appearing esophagus status post dilation.  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  Small hiatal hernia.  Marland Kitchen MALONEY DILATION N/A 08/18/2017   Procedure: Venia Minks DILATION;  Surgeon: Daneil Dolin, MD;  [facility];  Service: Endoscopy;  Laterality: N/A;  . MULTIPLE TOOTH EXTRACTIONS    . PR VEIN BYPASS GRAFT,AORTO-FEM-POP  06/12/10  . TEE WITHOUT CARDIOVERSION N/A 08/31/2014   Procedure: TRANSESOPHAGEAL ECHOCARDIOGRAM (TEE);  Surgeon: Rexene Alberts, MD;  Location: [place];  Service: Open Heart Surgery;  Laterality: N/A;  . TRANSCATHETER AORTIC VALVE REPLACEMENT, TRANSFEMORAL N/A 08/31/2014   Procedure: TRANSCATHETER AORTIC VALVE REPLACEMENT, TRANSFEMORAL approach;  Surgeon: Rexene Alberts, MD;  Location: [place];  Service: Open Heart Surgery;  Laterality: N/A;  . TUBAL LIGATION  1970's  . VAGINAL  HYSTERECTOMY  1970's   Partial      Current Meds  Medication Sig  . amiodarone (PACERONE) 200 MG tablet Take 1 tablet (200 mg total) by mouth daily.  . Cholecalciferol (VITAMIN D3) 50 MCG (2000 UT) capsule Take 1 capsule by mouth daily.   Marland Kitchen diltiazem (CARDIZEM CD) 180 MG 24 hr capsule Take 1 capsule (180 mg total) by mouth daily.  Marland Kitchen estradiol (ESTRACE) 2 MG tablet TAKE 1 TABLET BY MOUTH ONCE DAILY (Patient taking differently: Take 2 mg by mouth daily. )  . furosemide (LASIX) 20 MG tablet Take 1 tablet (20 mg total) by mouth every other day.  . ipratropium (ATROVENT) 0.06 % nasal spray Place 1 spray into both nostrils every 6 (six) hours as needed for rhinitis.  Marland Kitchen irbesartan (AVAPRO) 150 MG tablet Take 1 tablet (150 mg total) by mouth daily.  . meloxicam (MOBIC) 15 MG tablet Take 1 tablet (15 mg total) by mouth daily.  . metoprolol succinate (TOPROL-XL) 100 MG 24 hr tablet Take 1 tablet (100 mg total) by mouth daily.  . NP THYROID 90 MG tablet Take 1 tablet by mouth once daily  . pantoprazole (PROTONIX) 40 MG tablet TAKE 1 TABLET BY MOUTH  ONCE DAILY BEFORE SUPPER (Patient taking differently: Take 40 mg by mouth daily before supper. )  . potassium chloride SA (KLOR-CON) 20 MEQ tablet Take 1 tablet (20 mEq total) by mouth daily.  . rosuvastatin (CRESTOR) 20 MG tablet Take 1 tablet (20 mg total) by mouth at bedtime.  Marland Kitchen warfarin (COUMADIN) 2.5 MG tablet Take 1 tablet (2.5 mg total) by mouth daily.  . [DISCONTINUED] diltiazem (CARDIZEM CD) 180 MG 24 hr capsule Take 1 capsule (180 mg total) by mouth daily.  . [DISCONTINUED] irbesartan (AVAPRO) 150 MG tablet Take 1 tablet by mouth once daily (Patient taking differently: Take 150 mg by mouth daily. )  . [DISCONTINUED] metoprolol succinate (TOPROL-XL) 100 MG 24 hr tablet Take 1 tablet (100 mg total) by mouth daily.     Allergies:   Adhesive [tape], Latex, and Tetanus toxoids   Social History   Tobacco Use  . Smoking status: Former Smoker     Packs/day: 2.00    Years: 56.00    Pack years: 112.00    Types: Cigarettes    Start date: 03/02/1952    Quit date: 06/01/2010    Years since quitting: 9.0  . Smokeless tobacco: Never Used  . Tobacco comment: HAD WEANED DOWN TO 1/4 PK A DAY BEFORE D/C  Substance Use Topics  . Alcohol use: No    Alcohol/week: 0.0 standard drinks    Comment: 08/29/2014 "never drank much; quit in the 1990's"  . Drug use: No     Family Hx: The patient's family history includes Arthritis in her brother and daughter; Diabetes in her daughter; Heart attack in her father; Heart disease (age of onset: 38) in her mother; Heart disease (age of onset: 34) in her father; Hypertension in her father; Stroke in her brother and father; Varicose Veins in her mother. There is no history of Allergic rhinitis, Angioedema, Asthma, Atopy, Eczema, Immunodeficiency, or Urticaria.  ROS:   Please see the history of present illness.     All other systems reviewed and are negative.   Prior CV studies:   The following studies were reviewed today:   Echo 05/19/2018 1. The left ventricle has normal systolic function with an ejection  fraction of 60-65%. The cavity size was normal. There is mild concentric  left ventricular hypertrophy. Left ventricular diastolic function could  not be evaluated secondary to atrial  fibrillation.  2. The right ventricle has normal systolic function. The cavity was  normal. There is no increase in right ventricular wall thickness.  3. Left atrial size was mildly dilated.  4. Right atrial size was mildly dilated.  5. The mitral valve is grossly normal. There is mild mitral annular  calcification present.  6. A 26 an Edwards Edwards Sapien bioprosthetic aortic valve (TAVR) valve  is present in the aortic position. Normal aortic valve prosthesis.  7. The ascending aorta is normal in size and structure.  8. The inferior vena cava was normal in size with <50% respiratory  variability.  9.  When compared to the prior study: TAVR aortic valve gradient similar  to slightly lower as compared to prior.   Labs/Other Tests and Data Reviewed:    EKG:  An ECG dated 02/18/2019 was personally reviewed today and demonstrated:  Atrial fibrillation.  Recent Labs: 11/21/2018: Magnesium 1.9 02/18/2019: Hemoglobin 16.8; Platelets 99 03/14/2019: ALT 8; BUN 20; Creat 1.18; Potassium 5.0; Sodium 139; TSH 0.21   Recent Lipid Panel Lab Results  Component Value Date/Time   CHOL 126 03/14/2019 10:59  AM   CHOL 126 11/21/2018 11:46 AM   TRIG 114 03/14/2019 10:59 AM   HDL 40 (L) 03/14/2019 10:59 AM   HDL 51 11/21/2018 11:46 AM   CHOLHDL 3.2 03/14/2019 10:59 AM   LDLCALC 66 03/14/2019 10:59 AM    Wt Readings from Last 3 Encounters:  06/07/19 176 lb (79.8 kg)  04/14/19 170 lb (77.1 kg)  03/20/19 170 lb 12.8 oz (77.5 kg)     Objective:    Vital Signs:  Ht 5\' 10"  (1.778 m)   Wt 176 lb (79.8 kg)   BMI 25.25 kg/m    VITAL SIGNS:  reviewed  ASSESSMENT & PLAN:    1. CAD: Denies any chest discomfort.  Not on aspirin given the need for Coumadin  2. S/p TAVR: Stable on previous echocardiogram in 2020.  Due for repeat echocardiogram  3. PAF: Patient went back into atrial fibrillation based on EKG obtained in December 2020.  Will obtain nursing visit with EKG.  4. Hypertension: Continue on the current therapy  5. Hyperlipidemia: On Crestor  6. History of CVA: No recent recurrence.  COVID-19 Education: The signs and symptoms of COVID-19 were discussed with the patient and how to seek care for testing (follow up with PCP or arrange E-visit).  The importance of social distancing was discussed today.  Time:   Today, I have spent 10 minutes with the patient with telehealth technology discussing the above problems.     Medication Adjustments/Labs and Tests Ordered: Current medicines are reviewed at length with the patient today.  Concerns regarding medicines are outlined above.   Tests  Ordered: No orders of the defined types were placed in this encounter.   Medication Changes: Meds ordered this encounter  Medications  . diltiazem (CARDIZEM CD) 180 MG 24 hr capsule    Sig: Take 1 capsule (180 mg total) by mouth daily.    Dispense:  90 capsule    Refill:  3  . irbesartan (AVAPRO) 150 MG tablet    Sig: Take 1 tablet (150 mg total) by mouth daily.    Dispense:  90 tablet    Refill:  3  . metoprolol succinate (TOPROL-XL) 100 MG 24 hr tablet    Sig: Take 1 tablet (100 mg total) by mouth daily.    Dispense:  90 tablet    Refill:  3    Follow Up:  Either In Person or Virtual in 6 month(s)  Signed, Almyra Deforest, [place]  06/09/2019 11:37 PM    [facility]

## 2019-06-07 NOTE — Telephone Encounter (Signed)
  Patient Consent for Virtual Visit         Katelyn [name] has provided verbal consent on 06/07/2019 for a virtual visit (video or telephone).   CONSENT FOR VIRTUAL VISIT FOR:  Katelyn [name]  By participating in this virtual visit I agree to the following:  I hereby voluntarily request, consent and authorize [facility] and its employed or contracted physicians, physician assistants, nurse practitioners or other licensed health care professionals (the Practitioner), to provide me with telemedicine health care services (the "Services") as deemed necessary by the treating Practitioner. I acknowledge and consent to receive the Services by the Practitioner via telemedicine. I understand that the telemedicine visit will involve communicating with the Practitioner through live audiovisual communication technology and the disclosure of certain medical information by electronic transmission. I acknowledge that I have been given the opportunity to request an in-person assessment or other available alternative prior to the telemedicine visit and am voluntarily participating in the telemedicine visit.  I understand that I have the right to withhold or withdraw my consent to the use of telemedicine in the course of my care at any time, without affecting my right to future care or treatment, and that the Practitioner or I may terminate the telemedicine visit at any time. I understand that I have the right to inspect all information obtained and/or recorded in the course of the telemedicine visit and may receive copies of available information for a reasonable fee.  I understand that some of the potential risks of receiving the Services via telemedicine include:  Marland Kitchen Delay or interruption in medical evaluation due to technological equipment failure or disruption; . Information transmitted may not be sufficient (e.g. poor resolution of images) to allow for appropriate medical decision making by the Practitioner;  and/or  . In rare instances, security protocols could fail, causing a breach of personal health information.  Furthermore, I acknowledge that it is my responsibility to provide information about my medical history, conditions and care that is complete and accurate to the best of my ability. I acknowledge that Practitioner's advice, recommendations, and/or decision may be based on factors not within their control, such as incomplete or inaccurate data provided by me or distortions of diagnostic images or specimens that may result from electronic transmissions. I understand that the practice of medicine is not an exact science and that Practitioner makes no warranties or guarantees regarding treatment outcomes. I acknowledge that a copy of this consent can be made available to me via my patient portal ([facility]), or I can request a printed copy by calling the office of [facility].    I understand that my insurance will be billed for this visit.   I have read or had this consent read to me. . I understand the contents of this consent, which adequately explains the benefits and risks of the Services being provided via telemedicine.  . I have been provided ample opportunity to ask questions regarding this consent and the Services and have had my questions answered to my satisfaction. . I give my informed consent for the services to be provided through the use of telemedicine in my medical care

## 2019-06-07 NOTE — Telephone Encounter (Signed)
Called patient to discuss AVS instructions gave Hao Meng's recommendations and patient voiced understanding. AVS summary mailed to patient.    

## 2019-06-07 NOTE — Progress Notes (Signed)
INR on 06/05/19 was 2.3

## 2019-06-07 NOTE — Patient Instructions (Signed)
Medication Instructions:  Your physician recommends that you continue on your current medications as directed. Please refer to the Current Medication list given to you today.  *If you need a refill on your cardiac medications before your next appointment, please call your pharmacy*  Lab Work: NONE ordered at this time of appointment   If you have labs (blood work) drawn today and your tests are completely normal, you will receive your results only by: Marland Kitchen MyChart Message (if you have MyChart) OR . A paper copy in the mail If you have any lab test that is abnormal or we need to change your treatment, we will call you to review the results.  Testing/Procedures: Your physician has requested that you have an echocardiogram. Echocardiography is a painless test that uses sound waves to create images of your heart. It provides your doctor with information about the size and shape of your heart and how well your heart's chambers and valves are working. This procedure takes approximately one hour. There are no restrictions for this procedure.   PLEASE SCHEDULE WITHIN THE NEXT 2 MONTHS  Follow-Up: At [facility], you and your health needs are our priority.  As part of our continuing mission to provide you with exceptional heart care, we have created designated Provider Care Teams.  These Care Teams include your primary Cardiologist (physician) and Advanced Practice Providers (APPs -  Physician Assistants and Nurse Practitioners) who all work together to provide you with the care you need, when you need it.  Your next appointment:   6 month(s)  The format for your next appointment:   In Person  Provider:   Shelva Majestic, MD  Other Instructions You will need to be schedule for a Nurse visit in 1 month

## 2019-06-12 LAB — POCT INR: INR: 2.6 (ref 2–3)

## 2019-06-13 ENCOUNTER — Other Ambulatory Visit (INDEPENDENT_AMBULATORY_CARE_PROVIDER_SITE_OTHER): Payer: Self-pay | Admitting: Internal Medicine

## 2019-06-13 ENCOUNTER — Other Ambulatory Visit: Payer: Self-pay | Admitting: Gastroenterology

## 2019-06-13 DIAGNOSIS — Z79890 Hormone replacement therapy (postmenopausal): Secondary | ICD-10-CM

## 2019-06-14 ENCOUNTER — Other Ambulatory Visit: Payer: Self-pay

## 2019-06-14 ENCOUNTER — Ambulatory Visit (INDEPENDENT_AMBULATORY_CARE_PROVIDER_SITE_OTHER): Payer: Medicare Other | Admitting: Nurse Practitioner

## 2019-06-14 ENCOUNTER — Encounter (INDEPENDENT_AMBULATORY_CARE_PROVIDER_SITE_OTHER): Payer: Self-pay | Admitting: Nurse Practitioner

## 2019-06-14 VITALS — BP 140/85 | HR 48 | Temp 97.7°F | Ht 70.0 in | Wt 176.6 lb

## 2019-06-14 DIAGNOSIS — I10 Essential (primary) hypertension: Secondary | ICD-10-CM

## 2019-06-14 DIAGNOSIS — E559 Vitamin D deficiency, unspecified: Secondary | ICD-10-CM

## 2019-06-14 DIAGNOSIS — E785 Hyperlipidemia, unspecified: Secondary | ICD-10-CM

## 2019-06-14 DIAGNOSIS — N1832 Chronic kidney disease, stage 3b: Secondary | ICD-10-CM

## 2019-06-14 DIAGNOSIS — I480 Paroxysmal atrial fibrillation: Secondary | ICD-10-CM | POA: Diagnosis not present

## 2019-06-14 DIAGNOSIS — I2510 Atherosclerotic heart disease of native coronary artery without angina pectoris: Secondary | ICD-10-CM | POA: Diagnosis not present

## 2019-06-14 DIAGNOSIS — N1831 Chronic kidney disease, stage 3a: Secondary | ICD-10-CM | POA: Diagnosis not present

## 2019-06-14 NOTE — Progress Notes (Signed)
Patient told to continue her current dose of Coumadin.

## 2019-06-14 NOTE — Progress Notes (Signed)
Subjective:  Patient ID: Katelyn [name], female    DOB: [birth year]  Age: 79 y.o. MRN: [id number]  CC:  Chief Complaint  Patient presents with  . Hypertension  . Follow-up    Vitamin D deficiency, chronic kidney disease  . Hyperlipidemia  . Coronary Artery Disease  . Atrial Fibrillation      HPI  This patient arrives today for a office visit regarding her chronic conditions as listed above.  Hypertension: She has a history of hypertension.  She continues on metoprolol and diltiazem.  She says she is tolerating these medications well and is not experiencing any negative side effects.  Vitamin D deficiency: Last serum level was collected earlier this year in January and it was 60.  She continues on 5000 IUs vitamin D3 daily.  Chronic kidney disease stage III: She has history of chronic kidney disease.  Most recent blood work in January of this year showed EGFR 45 and creatinine of 1.18, this is improvement from previous values which were collected in October 2020 and showed EGFR of 38 and creatinine of 1.34.  She is on an ARB, she will be due to have her urine checked for albuminuria.  Hyperlipidemia: She continues on statin therapy.  Last LDL was collected in January of this year and it was 47.  Coronary artery disease/atrial fibrillation: She does have a history of coronary artery disease, she underwent cardiac catheterization initially in 2012 which showed disease in RCA, LAD, and circumflex vessels.  She was also noted to have aortic stenosis.  Her cardiac symptoms progressed so repeat catheterization was completed in 2016 which showed progressive occlusion to the RCA and progressed aortic stenosis.  She underwent TAVR on 08-31-2014, and developed atrial fibrillation postop.  This initially resolved, but she developed atrial fibrillation again in 2018.  She is now on amiodarone and warfarin.      Past Medical History:  Diagnosis Date  . Allergy    rhinitis  . Aortic stenosis,  severe   . Aortic stenosis, severe   . Arthritis    spine and various joints  . Cataract   . Cerebrovascular disease 12/05/2018  . Childhood asthma   . Chronic bronchitis ([employer])       . Claudication ([place])   . Coronary artery disease    a. April 2012 which revealed mid RCA occlusion with collaterals, 50% LAD stenosis, 30% circumflex stenosis, and 40% marginal stenosis  . GERD (gastroesophageal reflux disease)   . Heart murmur   . Hyperlipidemia   . Hypertension   . LUMBAR RADICULOPATHY, RIGHT 12/21/2007   Qualifier: Diagnosis of  By: Aline Brochure MD, Dorothyann Peng    . LUMBAR RADICULOPATHY, RIGHT   . Peripheral vascular disease ([place])   . 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 Seroma, postoperative    Left Groin  . Severe aortic valve stenosis 08/31/2014  . Stroke [facility]) 1982 X 2   "a little bit weaker on the left side since" (08/29/2014)      Family History  Problem Relation Age of Onset  . Heart disease Mother 42       died young  . Varicose Veins Mother   . Stroke Father   . Hypertension Father   . Heart disease Father 41       Aneurysm  . Heart attack Father   . Arthritis Brother   . Stroke  Brother   . Diabetes Daughter   . Arthritis Daughter   . Allergic rhinitis Neg Hx   . Angioedema Neg Hx   . Asthma Neg Hx   . Atopy Neg Hx   . Eczema Neg Hx   . Immunodeficiency Neg Hx   . Urticaria Neg Hx     Social History   Social History Narrative   [place] husband in November 2020. Retired,used to work at [employer].Lives alone.         Patient has one daughter, 3 grands   Likes to crochet   Works in [employer].    Social History   Tobacco Use  . Smoking status: Former Smoker    Packs/day: 2.00    Years: 56.00    Pack years: 112.00    Types: Cigarettes    Start date: 03/02/1952    Quit date: 06/01/2010    Years since quitting: 9.0  . Smokeless  tobacco: Never Used  . Tobacco comment: HAD WEANED DOWN TO 1/4 PK A DAY BEFORE D/C  Substance Use Topics  . Alcohol use: No    Alcohol/week: 0.0 standard drinks    Comment: 08/29/2014 "never drank much; quit in the 1990's"     Current Meds  Medication Sig  . amiodarone (PACERONE) 200 MG tablet Take 1 tablet (200 mg total) by mouth daily.  . Cholecalciferol (VITAMIN D3) 50 MCG (2000 UT) capsule Take 1 capsule by mouth daily.   Marland Kitchen diltiazem (CARDIZEM CD) 180 MG 24 hr capsule Take 1 capsule (180 mg total) by mouth daily.  Marland Kitchen estradiol (ESTRACE) 2 MG tablet Take 1 tablet by mouth once daily  . furosemide (LASIX) 20 MG tablet Take 1 tablet (20 mg total) by mouth every other day.  . ipratropium (ATROVENT) 0.06 % nasal spray Place 1 spray into both nostrils every 6 (six) hours as needed for rhinitis.  Marland Kitchen irbesartan (AVAPRO) 150 MG tablet Take 1 tablet (150 mg total) by mouth daily.  . meloxicam (MOBIC) 15 MG tablet Take 1 tablet (15 mg total) by mouth daily.  . metoprolol succinate (TOPROL-XL) 100 MG 24 hr tablet Take 1 tablet (100 mg total) by mouth daily.  . nitroGLYCERIN (NITROSTAT) 0.4 MG SL tablet Place 1 tablet (0.4 mg total) under the tongue every 5 (five) minutes as needed for chest pain.  . NP THYROID 90 MG tablet Take 1 tablet by mouth once daily  . pantoprazole (PROTONIX) 40 MG tablet TAKE 1 TABLET BY MOUTH ONCE DAILY BEFORE SUPPER (Patient taking differently: Take 40 mg by mouth daily before supper. )  . potassium chloride SA (KLOR-CON) 20 MEQ tablet Take 1 tablet (20 mEq total) by mouth daily.  . rosuvastatin (CRESTOR) 20 MG tablet Take 1 tablet (20 mg total) by mouth at bedtime.  Marland Kitchen warfarin (COUMADIN) 2.5 MG tablet Take 1 tablet (2.5 mg total) by mouth daily.    ROS:  Review of Systems  Constitutional: Negative for malaise/fatigue.  Respiratory: Negative for cough and shortness of breath.   Cardiovascular: Negative for chest pain and palpitations.  Neurological: Negative for  dizziness.     Objective:   Today's Vitals: BP 140/85 (BP Location: Left Arm, Patient Position: Sitting, Cuff Size: Normal)   Pulse (!) 48   Temp 97.7 F (36.5 C) (Temporal)   Ht '5\' 10"'  (1.778 m)   Wt 176 lb 9.6 oz (80.1 kg)   SpO2 96%   BMI 25.34 kg/m  Vitals with BMI 06/14/2019 06/07/2019 04/14/2019  Height '5\' 10"'   '5\' 10"'  5' 9.5"  Weight 176 lbs 10 oz 176 lbs 170 lbs  BMI 25.34 69.67 89.38  Systolic 101 - 751  Diastolic 85 - 67  Pulse 48 - 43     Physical Exam Vitals reviewed.  Constitutional:      General: She is not in acute distress.    Appearance: Normal appearance.  HENT:     Head: Normocephalic and atraumatic.  Neck:     Vascular: No carotid bruit.  Cardiovascular:     Rate and Rhythm: Normal rate and regular rhythm.     Pulses: Normal pulses.     Heart sounds: Normal heart sounds.  Pulmonary:     Effort: Pulmonary effort is normal.     Breath sounds: Normal breath sounds.  Skin:    General: Skin is warm and dry.  Neurological:     General: No focal deficit present.     Mental Status: She is alert and oriented to person, place, and time.  Psychiatric:        Mood and Affect: Mood normal.        Behavior: Behavior normal.        Judgment: Judgment normal.          Assessment and Plan   1. Vitamin D deficiency disease   2. Stage 3b chronic kidney disease   3. Paroxysmal atrial fibrillation (HCC)   4. CAD in native artery   5. Essential hypertension   6. Hyperlipidemia with target LDL less than 70      Plan: 1.  I will collect serum level today for further evaluation.  She will continue on her current medication regimen as prescribed for now.  2.  We will collect metabolic panel for further evaluation of renal function.  I would also like to check her urine to check for microalbuminuria, patient tells me she may not be able to urinate today, but is willing to come back another day to complete this.  3.  Clinically she appears to be in sinus  rhythm today.  She will continue on her current medication regimen as prescribed for now.  She will continue to check her INR at home and report this to [place] weekly.  She has been within goal on a regular basis for some time now.  She has not noted any signs of bleeding.  4.  Appears stable, no further changes to medication regimen will be made today.  5.  She will continue on her current medication as prescribed.  6.  She will continue on her current medication as prescribed.  May consider checking lipid panel again later on this year.  Of note, she did recently lose her husband.  She continues to grieve, but tells me she feels that her mood is fairly stable.  I did encourage her to reach out to [place] if her mood worsens.  She tells me she understands.   Tests ordered Orders Placed This Encounter  Procedures  . Vitamin D, 25-hydroxy  . CMP with eGFR([facility])  . Microalbumin/Creatinine Ratio, Urine      No orders of the defined types were placed in this encounter.   Patient to follow-up in 6 months or sooner as needed.  Ailene Ards, NP

## 2019-06-15 LAB — COMPLETE METABOLIC PANEL WITH GFR
AG Ratio: 1.6 (calc) (ref 1.0–2.5)
ALT: 9 U/L (ref 6–29)
AST: 16 U/L (ref 10–35)
Albumin: 3.9 g/dL (ref 3.6–5.1)
Alkaline phosphatase (APISO): 83 U/L (ref 37–153)
BUN/Creatinine Ratio: 15 (calc) (ref 6–22)
BUN: 17 mg/dL (ref 7–25)
CO2: 30 mmol/L (ref 20–32)
Calcium: 8.6 mg/dL (ref 8.6–10.4)
Chloride: 105 mmol/L (ref 98–110)
Creat: 1.1 mg/dL — ABNORMAL HIGH (ref 0.60–0.93)
GFR, Est African American: 56 mL/min/{1.73_m2} — ABNORMAL LOW (ref >=60)
GFR, Est Non African American: 49 mL/min/{1.73_m2} — ABNORMAL LOW (ref >=60)
Globulin: 2.4 g/dL (calc) (ref 1.9–3.7)
Glucose, Bld: 102 mg/dL — ABNORMAL HIGH (ref 65–99)
Potassium: 4.6 mmol/L (ref 3.5–5.3)
Sodium: 139 mmol/L (ref 135–146)
Total Bilirubin: 0.8 mg/dL (ref 0.2–1.2)
Total Protein: 6.3 g/dL (ref 6.1–8.1)

## 2019-06-15 LAB — VITAMIN D 25 HYDROXY (VIT D DEFICIENCY, FRACTURES): Vit D, 25-Hydroxy: 33 ng/mL (ref 30–100)

## 2019-06-16 ENCOUNTER — Ambulatory Visit (INDEPENDENT_AMBULATORY_CARE_PROVIDER_SITE_OTHER): Payer: Medicare Other | Admitting: Nurse Practitioner

## 2019-06-16 ENCOUNTER — Telehealth (INDEPENDENT_AMBULATORY_CARE_PROVIDER_SITE_OTHER): Payer: Self-pay | Admitting: Nurse Practitioner

## 2019-06-16 ENCOUNTER — Other Ambulatory Visit: Payer: Self-pay

## 2019-06-16 ENCOUNTER — Encounter (INDEPENDENT_AMBULATORY_CARE_PROVIDER_SITE_OTHER): Payer: Self-pay | Admitting: Nurse Practitioner

## 2019-06-16 VITALS — BP 131/57 | HR 44

## 2019-06-16 DIAGNOSIS — Z0000 Encounter for general adult medical examination without abnormal findings: Secondary | ICD-10-CM | POA: Diagnosis not present

## 2019-06-16 DIAGNOSIS — Z1159 Encounter for screening for other viral diseases: Secondary | ICD-10-CM | POA: Diagnosis not present

## 2019-06-16 NOTE — Telephone Encounter (Signed)
Please print out this patient's after visit summary from 06/16/2019 and mail this to her home.  Also call her, and ask her if she would be interested in lung cancer screening via a CT scan of the chest.  I forgot to discuss this with her at her office visit virtually on Friday.  If she is interested, please schedule her for a telephone conversation with me next time in the office so that I can discuss the risks and benefits of this test.  Thank you.

## 2019-06-16 NOTE — Progress Notes (Signed)
Due to national recommendations of social distancing related to the [place] pandemic, an audio/visual tele-health visit was felt to be the most appropriate encounter type for this patient today. I connected with  [name] on 06/16/19 utilizing audio-only technology and verified that I am speaking with the correct person using two identifiers. The patient was located at their home, and I was located at home during the encounter. I discussed the limitations of evaluation and management by telemedicine. The patient expressed understanding and agreed to proceed.  The patient did not have access to technology that would allow [place] to do this via video thus audio only technology was used.     Subjective:   Katelyn [name] is a 79 y.o. female who presents for Medicare Annual (Subsequent) preventive examination.  Review of Systems:  Reviewed and negative. Cardiac Risk Factors include: advanced age (>58men, >41 women)     Objective:     Vitals: BP (!) 131/57   Pulse (!) 44   There is no height or weight on file to calculate BMI.  Advanced Directives 06/16/2019 02/18/2019 08/18/2017 03/16/2016 03/11/2015 08/29/2014 08/29/2014  Does Patient Have a Medical Advance Directive? Yes Yes No No No No No  Type of Advance Directive Healthcare Power of [place]  Does patient want to make changes to medical advance directive? No - Patient declined - - - - - -  Copy of [place] in Chart? No - copy requested - - - - - -  Would patient like information on creating a medical advance directive? No - Guardian declined - No - Patient declined - - No - patient declined information No - patient declined information    Tobacco Social History   Tobacco Use  Smoking Status Former Smoker  . Packs/day: 2.00  . Years: 56.00  . Pack years: 112.00  . Types: Cigarettes  . Start date: 03/02/1952  . Quit date: 06/01/2010  . Years since quitting: 9.0  Smokeless  Tobacco Never Used  Tobacco Comment   HAD WEANED DOWN TO 1/4 PK A DAY BEFORE D/C     Counseling given: Not Answered Comment: HAD WEANED DOWN TO 1/4 PK A DAY BEFORE D/C   Clinical Intake:  Pre-visit preparation completed: Yes  Pain : No/denies pain     BMI - recorded: 25.34 Nutritional Status: BMI 25 -29 Overweight Nutritional Risks: None Diabetes: No  How often do you need to have someone help you when you read instructions, pamphlets, or other written materials from your doctor or pharmacy?: 1 - Never What is the last grade level you completed in school?: 12th grade  Interpreter Needed?: No  Information entered by :: Jeralyn Ruths, NP-C  Past Medical History:  Diagnosis Date  . Allergy    rhinitis  . Aortic stenosis, severe   . Aortic stenosis, severe   . Arthritis    spine and various joints  . Cataract   . Cerebrovascular disease 12/05/2018  . Childhood asthma   . Chronic bronchitis ([place])       . Claudication ([place])   . Coronary artery disease    a. April 2012 which revealed mid RCA occlusion with collaterals, 50% LAD stenosis, 30% circumflex stenosis, and 40% marginal stenosis  . GERD (gastroesophageal reflux disease)   . Heart murmur   . Hyperlipidemia   . Hypertension   . LUMBAR RADICULOPATHY, RIGHT 12/21/2007   Qualifier: Diagnosis of  By: Aline Brochure  MD, Dorothyann Peng    . LUMBAR RADICULOPATHY, RIGHT   . Peripheral vascular disease ([place])   . 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 Seroma, postoperative    Left Groin  . Severe aortic valve stenosis 08/31/2014  . Stroke [facility]) 1982 X 2   "a little bit weaker on the left side since" (08/29/2014)   Past Surgical History:  Procedure Laterality Date  . AORTO-FEMORAL BYPASS GRAFT Bilateral 06/27/10  . BASAL CELL CARCINOMA EXCISION Left    face  . CARDIAC CATHETERIZATION N/A 07/25/2014   Procedure:  Right/Left Heart Cath and Coronary Angiography;  Surgeon: Troy Sine, MD;  Location: [place] [facility];  Service: Cardiovascular;  Laterality: N/A;  . CATARACT EXTRACTION, BILATERAL Bilateral 09/29/2016  . COLONOSCOPY  2008   Dr. Laural Golden: normal.   . DILATION AND CURETTAGE OF UTERUS  "2 or 3"  . ESOPHAGOGASTRODUODENOSCOPY N/A 08/18/2017   Dr. Gala Romney: Normal-appearing esophagus status post dilation.  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  Small hiatal hernia.  Marland Kitchen MALONEY DILATION N/A 08/18/2017   Procedure: Venia Minks DILATION;  Surgeon: Daneil Dolin, MD;  [facility];  Service: Endoscopy;  Laterality: N/A;  . MULTIPLE TOOTH EXTRACTIONS    . PR VEIN BYPASS GRAFT,AORTO-FEM-POP  06/12/10  . TEE WITHOUT CARDIOVERSION N/A 08/31/2014   Procedure: TRANSESOPHAGEAL ECHOCARDIOGRAM (TEE);  Surgeon: Rexene Alberts, MD;  Location: [place];  Service: Open Heart Surgery;  Laterality: N/A;  . TRANSCATHETER AORTIC VALVE REPLACEMENT, TRANSFEMORAL N/A 08/31/2014   Procedure: TRANSCATHETER AORTIC VALVE REPLACEMENT, TRANSFEMORAL approach;  Surgeon: Rexene Alberts, MD;  Location: [place];  Service: Open Heart Surgery;  Laterality: N/A;  . TUBAL LIGATION  1970's  . VAGINAL HYSTERECTOMY  1970's   Partial    Family History  Problem Relation Age of Onset  . Heart disease Mother 35       died young  . Varicose Veins Mother   . Stroke Father   . Hypertension Father   . Heart disease Father 74       Aneurysm  . Heart attack Father   . Arthritis Brother   . Stroke Brother   . Diabetes Daughter   . Arthritis Daughter   . Allergic rhinitis Neg Hx   . Angioedema Neg Hx   . Asthma Neg Hx   . Atopy Neg Hx   . Eczema Neg Hx   . Immunodeficiency Neg Hx   . Urticaria Neg Hx    Social History   Socioeconomic History  . Marital status: Married    Spouse name: bobby  . Number of children: 1  . Years of education: Not on file  . Highest education level: High school graduate  Occupational History  .  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 Smoking status: Former Smoker    Packs/day: 2.00    Years: 56.00    Pack years: 112.00    Types: Cigarettes    Start date: 03/02/1952    Quit date: 06/01/2010    Years since quitting: 9.0  . Smokeless tobacco: Never Used  . Tobacco comment: HAD WEANED DOWN TO 1/4 PK A DAY BEFORE D/C  Substance and Sexual Activity  . Alcohol use: No    Alcohol/week: 0.0 standard drinks    Comment: 08/29/2014 "never drank much; quit in the 1990's"  . Drug use: No  . Sexual  activity: Yes    Partners: Male  Other Topics Concern  . Not on file  Social History Narrative   [place] husband in November 2020. Retired,used to work at [employer].Lives alone.         Patient has one daughter, 3 grands   Likes to crochet   Works in [employer].    Social Determinants of Health   Financial Resource Strain:   . Difficulty of Paying Living Expenses:   Food Insecurity:   . Worried About Charity fundraiser in the Last Year:   . Arboriculturist in the Last Year:   Transportation Needs:   . Film/video editor (Medical):   Marland Kitchen Lack of Transportation (Non-Medical):   Physical Activity:   . Days of Exercise per Week:   . Minutes of Exercise per Session:   Stress:   . Feeling of Stress :   Social Connections:   . Frequency of Communication with Friends and Family:   . Frequency of Social Gatherings with Friends and Family:   . Attends Religious Services:   . Active Member of Clubs or Organizations:   . Attends Archivist Meetings:   Marland Kitchen Marital Status:     Outpatient Encounter Medications as of 06/16/2019  Medication Sig  . amiodarone (PACERONE) 200 MG tablet Take 1 tablet (200 mg total) by mouth daily.  . Cholecalciferol (VITAMIN D3) 50 MCG (2000 UT) capsule Take 1 capsule by mouth daily.   Marland Kitchen diltiazem (CARDIZEM CD) 180 MG 24 hr capsule Take 1 capsule (180 mg total) by mouth daily.  Marland Kitchen estradiol (ESTRACE) 2 MG tablet Take 1  tablet by mouth once daily  . furosemide (LASIX) 20 MG tablet Take 1 tablet (20 mg total) by mouth every other day.  . ipratropium (ATROVENT) 0.06 % nasal spray Place 1 spray into both nostrils every 6 (six) hours as needed for rhinitis.  Marland Kitchen irbesartan (AVAPRO) 150 MG tablet Take 1 tablet (150 mg total) by mouth daily.  . meloxicam (MOBIC) 15 MG tablet Take 1 tablet (15 mg total) by mouth daily.  . metoprolol succinate (TOPROL-XL) 100 MG 24 hr tablet Take 1 tablet (100 mg total) by mouth daily.  . nitroGLYCERIN (NITROSTAT) 0.4 MG SL tablet Place 1 tablet (0.4 mg total) under the tongue every 5 (five) minutes as needed for chest pain.  . NP THYROID 90 MG tablet Take 1 tablet by mouth once daily  . pantoprazole (PROTONIX) 40 MG tablet TAKE 1 TABLET BY MOUTH ONCE DAILY BEFORE SUPPER  . potassium chloride SA (KLOR-CON) 20 MEQ tablet Take 1 tablet (20 mEq total) by mouth daily.  . rosuvastatin (CRESTOR) 20 MG tablet Take 1 tablet (20 mg total) by mouth at bedtime.  Marland Kitchen warfarin (COUMADIN) 2.5 MG tablet Take 1 tablet (2.5 mg total) by mouth daily.  . [DISCONTINUED] ipratropium (ATROVENT) 0.06 % nasal spray USE 1 SPRAY(S) IN EACH NOSTRIL EVERY 6 HOURS AS NEEDED FOR RUNNY NOSE  . [DISCONTINUED] meloxicam (MOBIC) 15 MG tablet Take 15 mg by mouth daily.  . [DISCONTINUED] meloxicam (MOBIC) 15 MG tablet Take 1 tablet by mouth once daily   No facility-administered encounter medications on file as of 06/16/2019.    Activities of Daily Living In your present state of health, do you have any difficulty performing the following activities: 06/16/2019  Hearing? N  Vision? N  Difficulty concentrating or making decisions? Y  Walking or climbing stairs? Y  Dressing or bathing? N  Doing errands, shopping?  N  Preparing Food and eating ? N  Using the Toilet? N  In the past six months, have you accidently leaked urine? Y  Do you have problems with loss of bowel control? N  Managing your Medications? N  Managing  your Finances? N  Housekeeping or managing your Housekeeping? N  Some recent data might be hidden    Patient Care Team: Doree Albee, MD as PCP - General (Internal Medicine) Troy Sine, MD as PCP - Cardiology (Cardiology) Troy Sine, MD as Attending Physician (Cardiology) Gala Romney Cristopher Estimable, MD as Consulting Physician (Gastroenterology) Almyra Deforest, [place] as Physician Assistant (Cardiology)    Assessment:   This is a routine wellness examination for [place].  Exercise Activities and Dietary recommendations Current Exercise Habits: Home exercise routine, Type of exercise: walking, Time (Minutes): 20, Frequency (Times/Week): 7, Weekly Exercise (Minutes/Week): 140, Intensity: Mild, Exercise limited by: cardiac condition(s)  Goals    . Exercise 150 min/wk Moderate Activity       Fall Risk Fall Risk  06/16/2019 06/14/2019 10/13/2017 07/30/2017 03/10/2017  Falls in the past year? 0 0 No No No  Number falls in past yr: 0 0 - - -  Injury with Fall? 0 0 - - -  Risk for fall due to : No Fall Risks No Fall Risks - - -  Follow up Falls prevention discussed;Education provided Falls evaluation completed - - -   Is the patient's home free of loose throw rugs in walkways, pet beds, electrical cords, etc?   yes      Grab bars in the bathroom? yes      Handrails on the stairs?   yes      Adequate lighting?   yes  Timed Get Up and Go performed: Not performed  Depression Screen PHQ 2/9 Scores 06/16/2019 06/14/2019 10/13/2017 07/30/2017  PHQ - 2 Score 0 0 0 0  Exception Documentation - Medical reason - -     Cognitive Function     6CIT Screen 06/16/2019  What Year? 0 points  What month? 0 points  What time? 0 points  Count back from 20 0 points  Months in reverse 0 points  Repeat phrase 2 points  Total Score 2    Immunization History  Administered Date(s) Administered  . Fluad Quad(high Dose 65+) 12/05/2018  . Influenza-Unspecified 11/30/2016  . Pneumococcal Conjugate-13 07/30/2017   . Pneumococcal Polysaccharide-23 10/20/2017  . Zoster 03/03/2007    Qualifies for Shingles Vaccine?  Has already been completed  Screening Tests Health Maintenance  Topic Date Due  . INFLUENZA VACCINE  10/01/2019  . DEXA SCAN  Completed  . PNA vac Low Risk Adult  Completed  . TETANUS/TDAP  Discontinued    Cancer Screenings: Lung: Low Dose CT Chest recommended if Age 65-80 years, 30 pack-year currently smoking OR have quit w/in 15years. Patient does qualify. Breast:  Up to date on Mammogram? Yes -no longer applicable Up to date of Bone Density/Dexa? No -patient would like to not undergo bone density scan at this time Colorectal: No longer applicable  Additional Screenings:  Hepatitis C Screening: Has not been completed and patient would like to be screened     Plan:   Patient is up-to-date with current recommended vaccines except for the COVID-19 vaccine, she will consider having this administered in the future.  As for screening she is up-to-date with screenings that are recommended for her except for lung cancer screening.  Unfortunately I forgot to discuss this with her  today, I will have my staff call to see if is something she is interested in.  If she is, I will have a risk versus benefit discussion with her in the future.  I have personally reviewed and noted the following in the patient's chart:   . Medical and social history . Use of alcohol, tobacco or illicit drugs  . Current medications and supplements . Functional ability and status . Nutritional status . Physical activity . Advanced directives . List of other physicians . Hospitalizations, surgeries, and ER visits in previous 12 months . Vitals . Screenings to include cognitive, depression, and falls . Referrals and appointments  In addition, I have reviewed and discussed with patient certain preventive protocols, quality metrics, and best practice recommendations. A written personalized care plan for  preventive services as well as general preventive health recommendations were provided to patient.    She will follow up as scheduled in October, or sooner as needed.  Ailene Ards, NP  06/16/2019

## 2019-06-16 NOTE — Patient Instructions (Signed)
  Ms. Lebron , Thank you for taking time to come for your Medicare Wellness Visit. I appreciate your ongoing commitment to your health goals. Please review the following plan we discussed and let me know if I can assist you in the future.   These are the goals we discussed: Goals    . Exercise 150 min/wk Moderate Activity       This is a list of the screening recommended for you and due dates:  Health Maintenance  Topic Date Due  . Flu Shot  10/01/2019  . DEXA scan (bone density measurement)  Completed  . Pneumonia vaccines  Completed  . Tetanus Vaccine  Discontinued

## 2019-06-19 DIAGNOSIS — Z7901 Long term (current) use of anticoagulants: Secondary | ICD-10-CM | POA: Diagnosis not present

## 2019-06-19 LAB — PROTIME-INR: INR: 2.1 — AB (ref 0.9–1.1)

## 2019-06-19 NOTE — Telephone Encounter (Signed)
Noted. Done.

## 2019-06-22 ENCOUNTER — Other Ambulatory Visit: Payer: Self-pay

## 2019-06-22 ENCOUNTER — Ambulatory Visit (HOSPITAL_COMMUNITY): Payer: Medicare Other | Attending: Cardiovascular Disease

## 2019-06-22 DIAGNOSIS — N1831 Chronic kidney disease, stage 3a: Secondary | ICD-10-CM | POA: Diagnosis not present

## 2019-06-22 DIAGNOSIS — Z952 Presence of prosthetic heart valve: Secondary | ICD-10-CM | POA: Diagnosis not present

## 2019-06-22 DIAGNOSIS — E559 Vitamin D deficiency, unspecified: Secondary | ICD-10-CM | POA: Diagnosis not present

## 2019-06-23 LAB — MICROALBUMIN / CREATININE URINE RATIO
Creatinine, Urine: 75 mg/dL (ref 20–275)
Microalb Creat Ratio: 17 mcg/mg creat (ref <30)
Microalb, Ur: 1.3 mg/dL

## 2019-06-26 DIAGNOSIS — Z23 Encounter for immunization: Secondary | ICD-10-CM | POA: Diagnosis not present

## 2019-06-28 LAB — POCT INR: INR: 2.6 (ref 2–3)

## 2019-06-29 ENCOUNTER — Telehealth (INDEPENDENT_AMBULATORY_CARE_PROVIDER_SITE_OTHER): Payer: Self-pay | Admitting: Nurse Practitioner

## 2019-06-29 NOTE — Telephone Encounter (Signed)
Spoke with patient and advised her to continue current coumadin dose with verbal understanding.

## 2019-06-29 NOTE — Telephone Encounter (Signed)
Please call this patient and let her know that her INR is at goal and she should continue on her current dose of coumadin.

## 2019-07-03 ENCOUNTER — Other Ambulatory Visit (INDEPENDENT_AMBULATORY_CARE_PROVIDER_SITE_OTHER): Payer: Medicare Other | Admitting: *Deleted

## 2019-07-03 ENCOUNTER — Other Ambulatory Visit: Payer: Self-pay

## 2019-07-03 VITALS — HR 51

## 2019-07-03 DIAGNOSIS — I480 Paroxysmal atrial fibrillation: Secondary | ICD-10-CM | POA: Diagnosis not present

## 2019-07-03 LAB — POCT INR: INR: 2.2 (ref 2–3)

## 2019-07-03 NOTE — Progress Notes (Unsigned)
1.) Reason for visit: EKG  2.) Name of MD requesting visit: Almyra Deforest PA    EKG completed by Jacqulynn Cadet CMA and reviewed with DOD, Dr. Stanford Breed.   No new orders received.

## 2019-07-03 NOTE — Progress Notes (Addendum)
Katelyn [name], please call this patient and notify her to continue her current dose of coumadin and to recheck her INR again next week. Thank you.

## 2019-07-03 NOTE — Progress Notes (Signed)
Katelyn [name] is aware

## 2019-07-10 LAB — POCT INR: INR: 2.5 (ref 2.0–3.0)

## 2019-07-11 ENCOUNTER — Other Ambulatory Visit (INDEPENDENT_AMBULATORY_CARE_PROVIDER_SITE_OTHER): Payer: Self-pay | Admitting: Nurse Practitioner

## 2019-07-12 ENCOUNTER — Encounter (INDEPENDENT_AMBULATORY_CARE_PROVIDER_SITE_OTHER): Payer: Self-pay

## 2019-07-12 LAB — POCT INR: INR: 2.5 (ref 2–3)

## 2019-07-12 NOTE — Progress Notes (Signed)
Serena Colonel, please call this patient and let her know that her INR is at goal. She can continue her current dose of warfarin. Please remind her to check again next week.

## 2019-07-12 NOTE — Progress Notes (Signed)
Katelyn [name] is aware

## 2019-07-17 DIAGNOSIS — Z7901 Long term (current) use of anticoagulants: Secondary | ICD-10-CM | POA: Diagnosis not present

## 2019-07-18 ENCOUNTER — Telehealth (INDEPENDENT_AMBULATORY_CARE_PROVIDER_SITE_OTHER): Payer: Self-pay

## 2019-07-18 LAB — POCT INR: INR: 2.8 (ref 2–3)

## 2019-07-24 ENCOUNTER — Ambulatory Visit (INDEPENDENT_AMBULATORY_CARE_PROVIDER_SITE_OTHER): Payer: Self-pay | Admitting: Nurse Practitioner

## 2019-07-24 DIAGNOSIS — Z23 Encounter for immunization: Secondary | ICD-10-CM | POA: Diagnosis not present

## 2019-07-24 LAB — POCT INR: INR: 2.4 (ref 2–3)

## 2019-07-24 NOTE — Progress Notes (Signed)
Katelyn [name], please call this patient and tell her to continue her current dose of her warfarin.

## 2019-07-24 NOTE — Progress Notes (Signed)
I left Echo a detailed voicemail of recommendation

## 2019-07-26 NOTE — Telephone Encounter (Signed)
closed

## 2019-08-01 ENCOUNTER — Telehealth (INDEPENDENT_AMBULATORY_CARE_PROVIDER_SITE_OTHER): Payer: Self-pay

## 2019-08-01 LAB — POCT INR: INR: 3.7 — AB (ref 2–3)

## 2019-08-01 NOTE — Telephone Encounter (Signed)
Tell her to stop Coumadin for 2 days and then restart  at the same dose.  Repeat check as scheduled in a week's time or so.

## 2019-08-02 ENCOUNTER — Other Ambulatory Visit: Payer: Self-pay

## 2019-08-02 ENCOUNTER — Ambulatory Visit (HOSPITAL_COMMUNITY)
Admission: RE | Admit: 2019-08-02 | Discharge: 2019-08-02 | Disposition: A | Discharge status: Home / Self Care | Payer: Medicare Other | Source: Ambulatory Visit | Attending: Cardiology | Admitting: Cardiology

## 2019-08-02 ENCOUNTER — Other Ambulatory Visit (HOSPITAL_COMMUNITY): Payer: Self-pay | Admitting: Cardiovascular Disease

## 2019-08-02 DIAGNOSIS — I6523 Occlusion and stenosis of bilateral carotid arteries: Secondary | ICD-10-CM | POA: Insufficient documentation

## 2019-08-02 NOTE — Telephone Encounter (Signed)
Pt was called and given instructions to STOP Coumadin for 2 days(Fri,Sat.). Then start back same dose on Sunday; take daily dose. Next week recheck her INR and we will wait for results via fax from MD INR.

## 2019-08-07 LAB — POCT INR: INR: 3 (ref 2–3)

## 2019-08-07 NOTE — Progress Notes (Signed)
Katelyn [name], please call this patient and let her know that her INR is in goal, but is borderline high. She can continue her current dose of her warfarin for now, but she should consider checking her INR again later this week (Thursday) to ensure that it remains in goal. Thank you.

## 2019-08-07 NOTE — Progress Notes (Signed)
Katelyn [name] is aware

## 2019-08-14 ENCOUNTER — Telehealth (INDEPENDENT_AMBULATORY_CARE_PROVIDER_SITE_OTHER): Payer: Self-pay

## 2019-08-14 DIAGNOSIS — Z7901 Long term (current) use of anticoagulants: Secondary | ICD-10-CM | POA: Diagnosis not present

## 2019-08-14 NOTE — Telephone Encounter (Signed)
Pt called and given instructions to continue same dose with Warfin;no changes. Will look for next upcoming in a week test/results.

## 2019-08-14 NOTE — Telephone Encounter (Signed)
Continue with the same dose of warfarin.

## 2019-08-14 NOTE — Telephone Encounter (Signed)
Fax from MD Inr was sent to office . Results are 2.2 . PLease advise.

## 2019-08-20 ENCOUNTER — Other Ambulatory Visit (INDEPENDENT_AMBULATORY_CARE_PROVIDER_SITE_OTHER): Payer: Self-pay | Admitting: Internal Medicine

## 2019-08-21 ENCOUNTER — Other Ambulatory Visit (INDEPENDENT_AMBULATORY_CARE_PROVIDER_SITE_OTHER): Payer: Self-pay | Admitting: Nurse Practitioner

## 2019-08-21 LAB — POCT INR: INR: 2.6 (ref 2.0–3.0)

## 2019-08-21 MED ORDER — THYROID 90 MG PO TABS: 90.0000 mg | ORAL_TABLET | Freq: Every day | ORAL | 0 refills | Status: DC

## 2019-08-21 NOTE — Progress Notes (Signed)
Patient's INR was 2.6 on her at home INR device.  She was called to continue taking her current dose of warfarin as her goal is to be between 2-3.

## 2019-08-21 NOTE — Progress Notes (Signed)
Patient requested refill on her thyroid medication today during phone call to discuss her INR. Refill sent.

## 2019-08-22 ENCOUNTER — Other Ambulatory Visit (INDEPENDENT_AMBULATORY_CARE_PROVIDER_SITE_OTHER): Payer: Self-pay

## 2019-08-23 MED ORDER — THYROID 90 MG PO TABS: 90.0000 mg | ORAL_TABLET | Freq: Every day | ORAL | 3 refills | Status: DC

## 2019-08-28 LAB — POCT INR: INR: 2.8 (ref 2–3)

## 2019-08-31 ENCOUNTER — Ambulatory Visit (INDEPENDENT_AMBULATORY_CARE_PROVIDER_SITE_OTHER): Payer: Self-pay | Admitting: Nurse Practitioner

## 2019-08-31 NOTE — Progress Notes (Signed)
Patient was told to continue her same dose of warfarin and recheck her INR again in 1 week

## 2019-09-04 LAB — POCT INR: INR: 2.9 (ref 2–3)

## 2019-09-05 ENCOUNTER — Telehealth (INDEPENDENT_AMBULATORY_CARE_PROVIDER_SITE_OTHER): Payer: Self-pay | Admitting: Nurse Practitioner

## 2019-09-05 ENCOUNTER — Ambulatory Visit (INDEPENDENT_AMBULATORY_CARE_PROVIDER_SITE_OTHER): Payer: Self-pay | Admitting: Nurse Practitioner

## 2019-09-05 NOTE — Progress Notes (Signed)
Patient will be told to continue current dose. She will be reminded to check her INR again next week.

## 2019-09-05 NOTE — Telephone Encounter (Signed)
Katelyn [name] Is aware of the results &  recommendation

## 2019-09-05 NOTE — Telephone Encounter (Signed)
Serena Colonel, please call patient let her know that the she should continue on her current dose of her Coumadin.  Remind her to check her INR again next week.  Thank you.

## 2019-09-11 LAB — POCT INR: INR: 2.3 (ref 2–3)

## 2019-09-16 ENCOUNTER — Other Ambulatory Visit (INDEPENDENT_AMBULATORY_CARE_PROVIDER_SITE_OTHER): Payer: Self-pay | Admitting: Internal Medicine

## 2019-09-18 ENCOUNTER — Telehealth (INDEPENDENT_AMBULATORY_CARE_PROVIDER_SITE_OTHER): Payer: Self-pay | Admitting: Nurse Practitioner

## 2019-09-18 ENCOUNTER — Ambulatory Visit (INDEPENDENT_AMBULATORY_CARE_PROVIDER_SITE_OTHER): Payer: Self-pay | Admitting: Nurse Practitioner

## 2019-09-18 LAB — POCT INR: INR: 2.6 (ref 2–3)

## 2019-09-18 NOTE — Telephone Encounter (Signed)
Katelyn [name] is aware of the recommendation

## 2019-09-18 NOTE — Telephone Encounter (Signed)
Katelyn [name], please call patient and let her know that she should continue her current dose of Coumadin as prescribed.  Remind her to check her INR again next week.  Thank you.

## 2019-09-19 LAB — POCT INR: INR: 2.9 (ref 2.0–3.0)

## 2019-09-21 ENCOUNTER — Other Ambulatory Visit (INDEPENDENT_AMBULATORY_CARE_PROVIDER_SITE_OTHER): Payer: Self-pay | Admitting: Internal Medicine

## 2019-09-21 DIAGNOSIS — Z79890 Hormone replacement therapy (postmenopausal): Secondary | ICD-10-CM

## 2019-09-25 LAB — POCT INR: INR: 2.9 (ref 2–3)

## 2019-10-02 ENCOUNTER — Ambulatory Visit (INDEPENDENT_AMBULATORY_CARE_PROVIDER_SITE_OTHER): Payer: Self-pay | Admitting: Nurse Practitioner

## 2019-10-02 NOTE — Progress Notes (Signed)
Patient was told to continue same dose of coumadin

## 2019-10-03 ENCOUNTER — Ambulatory Visit (INDEPENDENT_AMBULATORY_CARE_PROVIDER_SITE_OTHER): Payer: Self-pay | Admitting: Nurse Practitioner

## 2019-10-03 ENCOUNTER — Telehealth (INDEPENDENT_AMBULATORY_CARE_PROVIDER_SITE_OTHER): Payer: Self-pay | Admitting: Nurse Practitioner

## 2019-10-03 LAB — POCT INR: INR: 2 (ref 2–3)

## 2019-10-03 NOTE — Telephone Encounter (Signed)
Marion is aware

## 2019-10-03 NOTE — Progress Notes (Signed)
Patient to continue current dose.

## 2019-10-03 NOTE — Telephone Encounter (Signed)
Katelyn [name] is aware of the results / [facility] can't get any Transfer Tubes she only has 1 left please advise?

## 2019-10-03 NOTE — Telephone Encounter (Signed)
Please call this patient and let her know to continue her current dose of coumadin and recheck level next week. Thank you.

## 2019-10-03 NOTE — Telephone Encounter (Signed)
As far as I know she is to call [facility] (I think there is supposed to be a number on the device) to refill on her supplies. If she has already tried this then I would recommend calling Ashly at [facility] to ask how he get her additional supplies. His number is [phone].

## 2019-10-09 ENCOUNTER — Telehealth (INDEPENDENT_AMBULATORY_CARE_PROVIDER_SITE_OTHER): Payer: Self-pay

## 2019-10-09 DIAGNOSIS — Z7901 Long term (current) use of anticoagulants: Secondary | ICD-10-CM | POA: Diagnosis not present

## 2019-10-09 LAB — POCT INR: INR: 3 (ref 2–3)

## 2019-10-09 NOTE — Telephone Encounter (Signed)
Pt called and given instructions to continue same dose as directed by Dr Anastasio Champion. Will send in next week inr once taken.

## 2019-10-09 NOTE — Telephone Encounter (Signed)
Continue the same dose of warfarin/Coumadin.

## 2019-10-15 ENCOUNTER — Other Ambulatory Visit (INDEPENDENT_AMBULATORY_CARE_PROVIDER_SITE_OTHER): Payer: Self-pay | Admitting: Internal Medicine

## 2019-10-16 ENCOUNTER — Ambulatory Visit (INDEPENDENT_AMBULATORY_CARE_PROVIDER_SITE_OTHER): Payer: Self-pay | Admitting: Nurse Practitioner

## 2019-10-16 LAB — POCT INR: INR: 3.1 — AB (ref 2–3)

## 2019-10-16 NOTE — Progress Notes (Signed)
Patient takes one 2.5 mg tablet by mouth daily.  INR today was 3.1 which is above her goal of 2-3.  She was told to take 1/2 tablet by mouth when her next dose is due, and then go back to taking 1 tablet by mouth daily.  She was told to recollect her INR next week, and tells me she will.

## 2019-10-23 LAB — POCT INR: INR: 2.5 (ref 2–3)

## 2019-10-27 ENCOUNTER — Other Ambulatory Visit (INDEPENDENT_AMBULATORY_CARE_PROVIDER_SITE_OTHER): Payer: Self-pay | Admitting: Internal Medicine

## 2019-10-30 LAB — POCT INR: INR: 3 (ref 2–3)

## 2019-11-01 ENCOUNTER — Telehealth (INDEPENDENT_AMBULATORY_CARE_PROVIDER_SITE_OTHER): Payer: Self-pay | Admitting: Nurse Practitioner

## 2019-11-01 ENCOUNTER — Ambulatory Visit (INDEPENDENT_AMBULATORY_CARE_PROVIDER_SITE_OTHER): Payer: Self-pay | Admitting: Nurse Practitioner

## 2019-11-01 NOTE — Telephone Encounter (Signed)
Katelyn [name] is aware of the recommendations

## 2019-11-01 NOTE — Progress Notes (Signed)
Patient will be notified to continue her current dose of warfarin and then recheck INR next week

## 2019-11-01 NOTE — Progress Notes (Signed)
Patient told to continue same dose of warfarin and recheck INR next week.

## 2019-11-01 NOTE — Telephone Encounter (Signed)
Katelyn [name], please call patient let her know that she should continue on her current dose of warfarin and recheck her INR level again on 11/07/2019 because our office will be closed on 11/06/2019 due to the holiday.  Thank you.

## 2019-11-06 DIAGNOSIS — Z7901 Long term (current) use of anticoagulants: Secondary | ICD-10-CM | POA: Diagnosis not present

## 2019-11-06 LAB — POCT INR: INR: 2.7 (ref 2–3)

## 2019-11-07 ENCOUNTER — Telehealth (INDEPENDENT_AMBULATORY_CARE_PROVIDER_SITE_OTHER): Payer: Self-pay

## 2019-11-07 NOTE — Telephone Encounter (Signed)
MD inr via faxed over the INR result to office. Results is 2.7 . Copy will be scanned into chart.

## 2019-11-07 NOTE — Telephone Encounter (Signed)
Okay, continue with the same dose of warfarin as before.

## 2019-11-08 NOTE — Telephone Encounter (Signed)
Pt was called and given the instructions to continue same dose of warfarin. No changes this week.

## 2019-11-13 LAB — POCT INR: INR: 2.8 (ref 2–3)

## 2019-11-14 ENCOUNTER — Ambulatory Visit (INDEPENDENT_AMBULATORY_CARE_PROVIDER_SITE_OTHER): Payer: Self-pay | Admitting: Nurse Practitioner

## 2019-11-14 NOTE — Progress Notes (Signed)
Patient called and message left stating she should continue on current dose. No PHI was left on machine.

## 2019-11-20 ENCOUNTER — Encounter (INDEPENDENT_AMBULATORY_CARE_PROVIDER_SITE_OTHER): Payer: Self-pay | Admitting: Nurse Practitioner

## 2019-11-20 ENCOUNTER — Ambulatory Visit (INDEPENDENT_AMBULATORY_CARE_PROVIDER_SITE_OTHER): Payer: Self-pay | Admitting: Nurse Practitioner

## 2019-11-20 LAB — POCT INR: INR: 2.3 (ref 2–3)

## 2019-11-20 NOTE — Telephone Encounter (Signed)
This encounter was created in error - please disregard.

## 2019-11-20 NOTE — Progress Notes (Signed)
Patient called and told to continue on same dose of Coumadin.

## 2019-11-27 ENCOUNTER — Ambulatory Visit (INDEPENDENT_AMBULATORY_CARE_PROVIDER_SITE_OTHER): Payer: Self-pay | Admitting: Nurse Practitioner

## 2019-11-27 LAB — POCT INR: INR: 3.1 — AB (ref 2–3)

## 2019-11-27 NOTE — Progress Notes (Signed)
Patient told to hold today's dose, then restart medication as prescribed tomorrow and recheck level next week.

## 2019-12-04 DIAGNOSIS — Z7901 Long term (current) use of anticoagulants: Secondary | ICD-10-CM | POA: Diagnosis not present

## 2019-12-04 LAB — POCT INR: INR: 2.8 (ref 2.0–3.0)

## 2019-12-11 ENCOUNTER — Telehealth (INDEPENDENT_AMBULATORY_CARE_PROVIDER_SITE_OTHER): Payer: Self-pay | Admitting: Nurse Practitioner

## 2019-12-11 ENCOUNTER — Ambulatory Visit (INDEPENDENT_AMBULATORY_CARE_PROVIDER_SITE_OTHER): Payer: Self-pay | Admitting: Nurse Practitioner

## 2019-12-11 LAB — POCT INR: INR: 3.8 — AB (ref 2–3)

## 2019-12-11 NOTE — Telephone Encounter (Signed)
I called this patient to discuss her INR value.  Per MD INR her result is 2.8 but she tells me that it was actually 3.8 this morning.  I told her to skip her dose tonight and resume as normal tomorrow.  She was told to recheck INR next week as scheduled.  She denies any signs of bleeding and was told if these were to occur that she needs to call our office.  She tells me she understands.

## 2019-12-14 ENCOUNTER — Ambulatory Visit (INDEPENDENT_AMBULATORY_CARE_PROVIDER_SITE_OTHER): Payer: Medicare Other | Admitting: Internal Medicine

## 2019-12-14 ENCOUNTER — Encounter (INDEPENDENT_AMBULATORY_CARE_PROVIDER_SITE_OTHER): Payer: Self-pay | Admitting: Internal Medicine

## 2019-12-14 ENCOUNTER — Other Ambulatory Visit: Payer: Self-pay

## 2019-12-14 VITALS — BP 142/78 | HR 57 | Temp 97.5°F | Resp 18 | Ht 70.0 in | Wt 184.0 lb

## 2019-12-14 DIAGNOSIS — E2839 Other primary ovarian failure: Secondary | ICD-10-CM | POA: Diagnosis not present

## 2019-12-14 DIAGNOSIS — E785 Hyperlipidemia, unspecified: Secondary | ICD-10-CM

## 2019-12-14 DIAGNOSIS — I10 Essential (primary) hypertension: Secondary | ICD-10-CM | POA: Diagnosis not present

## 2019-12-14 DIAGNOSIS — I6523 Occlusion and stenosis of bilateral carotid arteries: Secondary | ICD-10-CM

## 2019-12-14 DIAGNOSIS — E559 Vitamin D deficiency, unspecified: Secondary | ICD-10-CM | POA: Diagnosis not present

## 2019-12-14 DIAGNOSIS — N1832 Chronic kidney disease, stage 3b: Secondary | ICD-10-CM | POA: Diagnosis not present

## 2019-12-14 NOTE — Progress Notes (Signed)
Metrics: Intervention Frequency ACO  Documented Smoking Status Yearly  Screened one or more times in 24 months  Cessation Counseling or  Active cessation medication Past 24 months  Past 24 months   Guideline developer: UpToDate (See UpToDate for funding source) Date Released: 2014       Wellness Office Visit  Subjective:  Patient ID: Katelyn [name], female    DOB: 04-06-1940  Age: 79 y.o. MRN: [id number]  CC: This lady comes in for follow-up of hypertension, chronic kidney disease, hyperlipidemia and vitamin D deficiency.  She also has menopausal symptoms and is on treatment with about" hormone therapy with estradiol. HPI  She is on chronic anticoagulation therapy with warfarin and her INR is therapeutic. She continues on Crestor for hyperlipidemia. She continues on desiccated NP thyroid for symptoms of thyroid hypofunction. She continues on diltiazem and Avapro for hypertension. Past Medical History:  Diagnosis Date  . Allergy    rhinitis  . Aortic stenosis, severe   . Aortic stenosis, severe   . Arthritis    spine and various joints  . Cataract   . Cerebrovascular disease 12/05/2018  . Childhood asthma   . Chronic bronchitis ([place])       . Claudication ([place])   . Coronary artery disease    a. April 2012 which revealed mid RCA occlusion with collaterals, 50% LAD stenosis, 30% circumflex stenosis, and 40% marginal stenosis  . GERD (gastroesophageal reflux disease)   . Heart murmur   . Hyperlipidemia   . Hypertension   . LUMBAR RADICULOPATHY, RIGHT 12/21/2007   Qualifier: Diagnosis of  By: Aline Brochure MD, Dorothyann Peng    . LUMBAR RADICULOPATHY, RIGHT   . Peripheral vascular disease ([place])   . 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 Seroma, postoperative    Left Groin  . Severe aortic valve stenosis 08/31/2014  . Stroke [facility]) 1982 X 2   "a little bit weaker on the  left side since" (08/29/2014)   Past Surgical History:  Procedure Laterality Date  . AORTO-FEMORAL BYPASS GRAFT Bilateral 06/27/10  . BASAL CELL CARCINOMA EXCISION Left    face  . CARDIAC CATHETERIZATION N/A 07/25/2014   Procedure: Right/Left Heart Cath and Coronary Angiography;  Surgeon: Troy Sine, MD;  Location: [facility];  Service: Cardiovascular;  Laterality: N/A;  . CATARACT EXTRACTION, BILATERAL Bilateral 09/29/2016  . COLONOSCOPY  2008   Dr. Laural Golden: normal.   . DILATION AND CURETTAGE OF UTERUS  "2 or 3"  . ESOPHAGOGASTRODUODENOSCOPY N/A 08/18/2017   Dr. Gala Romney: Normal-appearing esophagus status post dilation.  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  Small hiatal hernia.  Marland Kitchen MALONEY DILATION N/A 08/18/2017   Procedure: Venia Minks DILATION;  Surgeon: Daneil Dolin, MD;  [facility];  Service: Endoscopy;  Laterality: N/A;  . MULTIPLE TOOTH EXTRACTIONS    . PR VEIN BYPASS GRAFT,AORTO-FEM-POP  06/12/10  . TEE WITHOUT CARDIOVERSION N/A 08/31/2014   Procedure: TRANSESOPHAGEAL ECHOCARDIOGRAM (TEE);  Surgeon: Rexene Alberts, MD;  Location: [facility];  Service: Open Heart Surgery;  Laterality: N/A;  . TRANSCATHETER AORTIC VALVE REPLACEMENT, TRANSFEMORAL N/A 08/31/2014   Procedure: TRANSCATHETER AORTIC VALVE REPLACEMENT, TRANSFEMORAL approach;  Surgeon: Rexene Alberts, MD;  Location: [facility];  Service: Open Heart Surgery;  Laterality: N/A;  . TUBAL LIGATION  1970's  . VAGINAL HYSTERECTOMY  1970's   Partial      Family History  Problem  Relation Age of Onset  . Heart disease Mother 68       died young  . Varicose Veins Mother   . Stroke Father   . Hypertension Father   . Heart disease Father 47       Aneurysm  . Heart attack Father   . Arthritis Brother   . Stroke Brother   . Diabetes Daughter   . Arthritis Daughter   . Allergic rhinitis Neg Hx   . Angioedema Neg Hx   . Asthma Neg Hx   . Atopy Neg Hx   . Eczema Neg Hx   . Immunodeficiency Neg Hx   . Urticaria  Neg Hx     Social History   Social History Narrative   [place] husband in November 2020. Retired,used to work at [employer].Lives alone.         Patient has one daughter, 3 grands   Likes to crochet   Works in [employer].    Social History   Tobacco Use  . Smoking status: Former Smoker    Packs/day: 2.00    Years: 56.00    Pack years: 112.00    Types: Cigarettes    Start date: 03/02/1952    Quit date: 06/01/2010    Years since quitting: 9.5  . Smokeless tobacco: Never Used  . Tobacco comment: HAD WEANED DOWN TO 1/4 PK A DAY BEFORE D/C  Substance Use Topics  . Alcohol use: No    Alcohol/week: 0.0 standard drinks    Comment: 08/29/2014 "never drank much; quit in the 1990's"    Current Meds  Medication Sig  . amiodarone (PACERONE) 200 MG tablet Take 1 tablet (200 mg total) by mouth daily.  . Cholecalciferol (VITAMIN D3) 50 MCG (2000 UT) capsule Take 1 capsule by mouth daily.   Marland Kitchen diltiazem (CARDIZEM CD) 180 MG 24 hr capsule Take 1 capsule (180 mg total) by mouth daily.  Marland Kitchen estradiol (ESTRACE) 2 MG tablet Take 1 tablet by mouth once daily  . furosemide (LASIX) 20 MG tablet Take 1 tablet (20 mg total) by mouth every other day.  . ipratropium (ATROVENT) 0.06 % nasal spray USE 1 SPRAY(S) IN EACH NOSTRIL EVERY 6 HOURS AS NEEDED FOR RHINITIS  . irbesartan (AVAPRO) 150 MG tablet Take 1 tablet (150 mg total) by mouth daily.  . meloxicam (MOBIC) 15 MG tablet Take 1 tablet by mouth once daily  . metoprolol succinate (TOPROL-XL) 100 MG 24 hr tablet Take 1 tablet (100 mg total) by mouth daily.  . nitroGLYCERIN (NITROSTAT) 0.4 MG SL tablet Place 1 tablet (0.4 mg total) under the tongue every 5 (five) minutes as needed for chest pain.  . pantoprazole (PROTONIX) 40 MG tablet TAKE 1 TABLET BY MOUTH ONCE DAILY BEFORE SUPPER  . potassium chloride SA (KLOR-CON) 20 MEQ tablet Take 1 tablet (20 mEq total) by mouth daily.  . rosuvastatin (CRESTOR) 20 MG tablet TAKE 1 TABLET BY MOUTH AT BEDTIME   . thyroid (NP THYROID) 90 MG tablet Take 1 tablet (90 mg total) by mouth daily.  Marland Kitchen warfarin (COUMADIN) 2.5 MG tablet Take 1 tablet by mouth once daily      Depression screen [facility] 2/9 06/16/2019 06/14/2019 10/13/2017 07/30/2017 03/10/2017  Decreased Interest 0 0 0 0 0  Down, Depressed, Hopeless 0 0 0 0 0  PHQ - 2 Score 0 0 0 0 0  Some recent data might be hidden     Objective:   Today's Vitals: BP (!) 142/78 (BP Location: Right  Arm, Patient Position: Sitting, Cuff Size: Normal)   Pulse (!) 57   Temp (!) 97.5 F (36.4 C) (Temporal)   Resp 18   Ht 5\' 10"  (1.778 m)   Wt 184 lb (83.5 kg)   SpO2 98%   BMI 26.40 kg/m  Vitals with BMI 12/14/2019 07/03/2019 06/16/2019  Height 5\' 10"  - -  Weight 184 lbs - -  BMI 11.9 - -  Systolic 417 - 408  Diastolic 78 - 57  Pulse 57 51 44     Physical Exam  She looks systemically well.  Blood pressure is reasonable.  No new physical findings.     Assessment   1. Vitamin D deficiency disease   2. Stage 3b chronic kidney disease ([place])   3. Essential hypertension   4. Hyperlipidemia with target LDL less than 70   5. Primary ovarian failure       Tests ordered Orders Placed This Encounter  Procedures  . Estradiol  . T3, free  . TSH  . Lipid panel  . VITAMIN D 25 Hydroxy (Vit-D Deficiency, Fractures)  . COMPLETE METABOLIC PANEL WITH GFR     Plan: 1. She will continue with antihypertensive therapy documented above.  We will check renal function. 2. She will continue with estradiol and we will check estradiol levels today.  Today I mentioned the use of progesterone also and she will think about it. 3. We will check thyroid function and she will continue with desiccated NP thyroid as before. 4. Further recommendations will depend on blood results and she will follow-up as scheduled with Judson Roch in April of next year.  High-dose influenza vaccination was given today.   No orders of the defined types were placed in this  encounter.   Doree Albee, MD

## 2019-12-15 LAB — COMPLETE METABOLIC PANEL WITH GFR
AG Ratio: 1.6 (calc) (ref 1.0–2.5)
ALT: 15 U/L (ref 6–29)
AST: 16 U/L (ref 10–35)
Albumin: 3.8 g/dL (ref 3.6–5.1)
Alkaline phosphatase (APISO): 88 U/L (ref 37–153)
BUN/Creatinine Ratio: 19 (calc) (ref 6–22)
BUN: 20 mg/dL (ref 7–25)
CO2: 28 mmol/L (ref 20–32)
Calcium: 8.5 mg/dL — ABNORMAL LOW (ref 8.6–10.4)
Chloride: 105 mmol/L (ref 98–110)
Creat: 1.03 mg/dL — ABNORMAL HIGH (ref 0.60–0.93)
GFR, Est African American: 61 mL/min/{1.73_m2} (ref >=60)
GFR, Est Non African American: 52 mL/min/{1.73_m2} — ABNORMAL LOW (ref >=60)
Globulin: 2.4 g/dL (calc) (ref 1.9–3.7)
Glucose, Bld: 86 mg/dL (ref 65–99)
Potassium: 4.6 mmol/L (ref 3.5–5.3)
Sodium: 140 mmol/L (ref 135–146)
Total Bilirubin: 0.9 mg/dL (ref 0.2–1.2)
Total Protein: 6.2 g/dL (ref 6.1–8.1)

## 2019-12-15 LAB — LIPID PANEL
Cholesterol: 133 mg/dL (ref <200)
HDL: 57 mg/dL (ref >=50)
LDL Cholesterol (Calc): 57 mg/dL (calc)
Non-HDL Cholesterol (Calc): 76 mg/dL (calc) (ref <130)
Total CHOL/HDL Ratio: 2.3 (calc) (ref <5.0)
Triglycerides: 105 mg/dL (ref <150)

## 2019-12-15 LAB — ESTRADIOL: Estradiol: 150 pg/mL

## 2019-12-15 LAB — TSH: TSH: 1.98 m[IU]/L (ref 0.40–4.50)

## 2019-12-15 LAB — VITAMIN D 25 HYDROXY (VIT D DEFICIENCY, FRACTURES): Vit D, 25-Hydroxy: 30 ng/mL (ref 30–100)

## 2019-12-15 LAB — T3, FREE: T3, Free: 2.4 pg/mL (ref 2.3–4.2)

## 2019-12-19 ENCOUNTER — Encounter: Payer: Self-pay | Admitting: Nurse Practitioner

## 2019-12-19 ENCOUNTER — Telehealth (INDEPENDENT_AMBULATORY_CARE_PROVIDER_SITE_OTHER): Payer: Self-pay

## 2019-12-19 LAB — POCT INR: INR: 2.2 (ref 2.0–3.0)

## 2019-12-19 NOTE — Telephone Encounter (Signed)
Pt is given as recommendations.

## 2019-12-19 NOTE — Telephone Encounter (Signed)
Continue with same dose of warfarin/Coumadin.

## 2019-12-25 ENCOUNTER — Encounter: Payer: Self-pay | Admitting: Nurse Practitioner

## 2019-12-25 LAB — POCT INR: INR: 3.3 — AB (ref 2.0–3.0)

## 2020-01-01 ENCOUNTER — Ambulatory Visit (INDEPENDENT_AMBULATORY_CARE_PROVIDER_SITE_OTHER): Payer: Self-pay | Admitting: Nurse Practitioner

## 2020-01-01 DIAGNOSIS — Z7901 Long term (current) use of anticoagulants: Secondary | ICD-10-CM | POA: Diagnosis not present

## 2020-01-01 LAB — POCT INR: INR: 2.9 (ref 2–3)

## 2020-01-01 NOTE — Progress Notes (Signed)
Attempted to call patient to discuss INR level. She did not answer phone, but message was left stating she should continue her current dose of warfarin and to then recheck next week.

## 2020-01-07 ENCOUNTER — Other Ambulatory Visit (INDEPENDENT_AMBULATORY_CARE_PROVIDER_SITE_OTHER): Payer: Self-pay | Admitting: Internal Medicine

## 2020-01-07 DIAGNOSIS — Z79890 Hormone replacement therapy (postmenopausal): Secondary | ICD-10-CM

## 2020-01-08 ENCOUNTER — Ambulatory Visit (INDEPENDENT_AMBULATORY_CARE_PROVIDER_SITE_OTHER): Payer: Self-pay | Admitting: Nurse Practitioner

## 2020-01-08 LAB — POCT INR: INR: 3.5 — AB (ref 2–3)

## 2020-01-08 NOTE — Patient Instructions (Signed)
Patient will be told to hold one dose of her warfarin (next dose) and to then take 1 tablet by mouth daily and to recheck INR in 1 week.

## 2020-01-15 ENCOUNTER — Telehealth (INDEPENDENT_AMBULATORY_CARE_PROVIDER_SITE_OTHER): Payer: Self-pay

## 2020-01-15 ENCOUNTER — Encounter (INDEPENDENT_AMBULATORY_CARE_PROVIDER_SITE_OTHER): Payer: Self-pay

## 2020-01-15 LAB — POCT INR: INR: 3.2 — AB (ref <=1.1)

## 2020-01-15 NOTE — Telephone Encounter (Signed)
Called and left a detailed voice message for patient per Dr. Anastasio Champion for her to continue same dosage of Coumadin per INR results of 3.2 from 01/15/2020 from md INR self testing service. INR has been abstracted into chart.

## 2020-01-18 ENCOUNTER — Other Ambulatory Visit (INDEPENDENT_AMBULATORY_CARE_PROVIDER_SITE_OTHER): Payer: Self-pay | Admitting: Internal Medicine

## 2020-01-22 ENCOUNTER — Ambulatory Visit (INDEPENDENT_AMBULATORY_CARE_PROVIDER_SITE_OTHER): Payer: Medicare Other | Admitting: Cardiovascular Disease

## 2020-01-22 ENCOUNTER — Other Ambulatory Visit: Payer: Self-pay

## 2020-01-22 ENCOUNTER — Encounter: Payer: Self-pay | Admitting: Cardiovascular Disease

## 2020-01-22 VITALS — BP 198/82 | HR 47 | Ht 70.0 in | Wt 185.8 lb

## 2020-01-22 DIAGNOSIS — Z7901 Long term (current) use of anticoagulants: Secondary | ICD-10-CM | POA: Diagnosis not present

## 2020-01-22 DIAGNOSIS — I272 Pulmonary hypertension, unspecified: Secondary | ICD-10-CM | POA: Diagnosis not present

## 2020-01-22 DIAGNOSIS — Z952 Presence of prosthetic heart valve: Secondary | ICD-10-CM

## 2020-01-22 DIAGNOSIS — I739 Peripheral vascular disease, unspecified: Secondary | ICD-10-CM | POA: Diagnosis not present

## 2020-01-22 DIAGNOSIS — I2510 Atherosclerotic heart disease of native coronary artery without angina pectoris: Secondary | ICD-10-CM

## 2020-01-22 DIAGNOSIS — I480 Paroxysmal atrial fibrillation: Secondary | ICD-10-CM

## 2020-01-22 DIAGNOSIS — I10 Essential (primary) hypertension: Secondary | ICD-10-CM

## 2020-01-22 DIAGNOSIS — I6523 Occlusion and stenosis of bilateral carotid arteries: Secondary | ICD-10-CM | POA: Diagnosis not present

## 2020-01-22 DIAGNOSIS — R001 Bradycardia, unspecified: Secondary | ICD-10-CM | POA: Diagnosis not present

## 2020-01-22 DIAGNOSIS — E785 Hyperlipidemia, unspecified: Secondary | ICD-10-CM

## 2020-01-22 DIAGNOSIS — Z79899 Other long term (current) drug therapy: Secondary | ICD-10-CM

## 2020-01-22 DIAGNOSIS — Z5181 Encounter for therapeutic drug level monitoring: Secondary | ICD-10-CM | POA: Diagnosis not present

## 2020-01-22 MED ORDER — CHLORTHALIDONE 25 MG PO TABS: 12.5000 mg | ORAL_TABLET | Freq: Every day | ORAL | 3 refills | Status: DC

## 2020-01-22 MED ORDER — DILTIAZEM HCL ER COATED BEADS 120 MG PO CP24: 120.0000 mg | ORAL_CAPSULE | Freq: Every day | ORAL | 3 refills | Status: DC

## 2020-01-22 MED ORDER — IRBESARTAN 300 MG PO TABS: 300.0000 mg | ORAL_TABLET | Freq: Every day | ORAL | 3 refills | Status: DC

## 2020-01-22 NOTE — Patient Instructions (Signed)
Medication Instructions:  Stop taking  Furosemide Start taking Chlorthalidone 12.5 mg  ( 1/2 tablet of 25 mg )  Daily   Stop taking Irbesaratan  150  mg Change to  Taking Irbesartan 300 mg daily   Stop taking diltiazem CD 180 mg Start taking Diltiazem CD 120 mg  Daily  *If you need a refill on your cardiac medications before your next appointment, please call your pharmacy*   Lab Work: [facility] for 2 weeks If you have labs (blood work) drawn today and your tests are completely normal, you will receive your results only by: Marland Kitchen MyChart Message (if you have MyChart) OR . A paper copy in the mail If you have any lab test that is abnormal or we need to change your treatment, we will call you to review the results.   Testing/Procedures: Will be schedule at [place] has requested that you have a renal artery duplex. During this test, an ultrasound is used to evaluate blood flow to the kidneys. Allow one hour for this exam. Do not eat after midnight the day before and avoid carbonated beverages. Take your medications as you usually do.     Follow-Up: At [facility], you and your health needs are our priority.  As part of our continuing mission to provide you with exceptional heart care, we have created designated Provider Care Teams.  These Care Teams include your primary Cardiologist (physician) and Advanced Practice Providers (APPs -  Physician Assistants and Nurse Practitioners) who all work together to provide you with the care you need, when you need it.  We recommend signing up for the patient portal called "MyChart".  Sign up information is provided on this After Visit Summary.  MyChart is used to connect with patients for Virtual Visits (Telemedicine).  Patients are able to view lab/test results, encounter notes, upcoming appointments, etc.  Non-urgent messages can be sent to your provider as well.   To learn more about what you can do with MyChart, go  to [email or web address].    Your next appointment:   3 month(s)  The format for your next appointment:   In Person  Provider:   Shelva Majestic, MD  You have been referred to [email or web address]  - blood pressure

## 2020-01-22 NOTE — Progress Notes (Signed)
Patient ID: Katelyn [name], female   DOB: 08-31-1940, 79 y.o.   MRN: [id number]    PCP: Dr. Newt Minion  HPI: Katelyn [name] is a 79 y.o. female who presents to the office for a 12 month follow-up cardiology evaluation  Katelyn [name] has known CAD and underwent initial cardiac catheterization in  April 2012 which revealed mid RCA occlusion with collaterals, 50% LAD stenosis, 30% circumflex stenosis, and 40% marginal stenosis. 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 Trula Slade. She has a history of hypertension with grade 2 diastolic dysfunction, hyperlipidemia, and has developed moderately severe aortic valve stenosis. An echo Doppler study in May 2013 showed a mean transvalvular aortic gradient of 34 with a maximum of 56. In December 2013, her mean gradient was 32 and peak 56 with a valve area of 0.84. An echo Doppler study on 12/06/2012 continued to show normal systolic function with an ejection fraction of 60-65%. She had grade 1 diastolic dysfunction. Peak gradients were  61 mm and mean gradient was 38 mm.  The aortic valve area 0.8-0.9 cm. She had mild to moderate aortic insufficiency. Had mildly thickened mitral valve leaflets with mild MR. The left atrium was mildly dilated. She had mild TR. Pulmonary pressure was mildly increased at 33 mm.  A follow-up echo Doppler study on 02/12/2014  Showed normal EF at 55-60%. Her aortic valve was severely calcified and stenosis was felt to be severe with a mean gradient of 42, a peak gradient of 64 which had slightly increaed from 38 and 61 mm, respectively.  She began to develop exertional chest discomfort with associated dyspnea and fatigue.  She underwent repeat cardiac catheterization on 07/25/2014 which showed a chronically occluded RCA with left-to-right collaterals, a 70% diagonal stenosis, 50% ostial circumflex  stenosis and 40% AV groove circumflex stenosis.  Her aortic stenosis at further progressed such that she had a mean gradient of 42 mm.  She was felt to be a poor candidate for conventional aortic valve replacement and on 08/31/2014 underwent successful  TAVR with placement of a 26 mm Edward Sapien 3 valve from the right femoral approach by Drs. Roxy Manns and [place].  She developed postoperative atrial fibrillation for which she was transiently treated with amiodarone and when seen for office follow-up she was back in sinus rhythm.  She has noticed improvement in energy.  She is unaware of recurrent atrial fibrillation.  She denies recurrent chest tightness.  She at times is fatigued.  A subsequent echo on 10/01/2014 showed an EF of 60-65% with a mean aortic gradient of 16 mm and peak gradient 26 with trivial aortic insufficiency.    A follow-up echo Doppler study on 09/09/2015 showed an EF of 60-65% and grade 2 diastolic dysfunction.  There were no wall motion abnormalities.  Her 26 mm Edward's Sapien 3 valve was well-seated from her TAVR procedure.  The mean valve gradient was 16 mm.  There was no evidence for perivalvular leak.  There was mitral annular calcification with mild MR, mild LA dilatation and mild dilation of her right ventricle.  There is mild coronary hypertension with a PA pressure 33 mm.    When I  saw her in October 2017, her dose of valsartan had been reduced prior to the office visit by Dr. Karie Kirks.  At that time, she was having more frequent palpitations and had trigeminy on her ECG.  With her being on reduce valsartan.  I recommended further titration of her beta blocker and increased her Toprol to 50 mg daily.    When I saw her in December 2017 her blood pressure was significantly elevated.  Her resting pulse was 57.  She was no longer taking valsartan and I added amlodipine 5 mg for more optimal blood pressure control.  She  underwent lower extremity Doppler studies on 03/16/2016 by  [place] which revealed normal ABIs bilaterally. When I saw her in f/u on 03/31/16 BP was improved but was in atrial fibrillation which was new.  She has continued to be on warfarin anticoagulation and her INR was therapeutic.  I further titrated Toprol-XL to 75 mg for 5 days and recommended that she increase it to 100 mg depending upon her heart rate response.  She did take 100 mg, she did experience some dizziness and as result, self reduced her back to 75 mg daily.  She is unaware of her rhythm being irregular.  In March 2018 her blood pressure was stable.  On 09/09/2016, she underwent a one-year follow-up echo Doppler study.  This showed mild LVH with normal systolic function.  There was grade 1 diastolic dysfunction.  The TAVR bioprosthesis was functioning well.  She had mild LA dilation and mild pulmonary hypertension at 40 mm.  She recently underwent lab work in May by Dr. Karie Kirks.  After she left the office.  I was able to obtain these results.  This showed a cholesterol of 128 and LDL of 54, HDL 49, and triglycerides of 168.  Her creatinine was 1.0.  Hemoglobin 15.7, hematocrit 45.8.  She had recently been notified by her pharmacy that the valsartan that she was taken was recently recalled.  When I last saw her, she had stopped taking the valsartan.  I changed her to irbesartan 150 mg.  She was seen by Dr. Meda Coffee, for primary care in [place] in early February 2019. Her INR was therapeutic at 2.6.  She was on amlodipine 5 mg, furosemide as needed, irbesartan 150 mg, and Toprol 25 mg in addition to rosuvastatin 20 mg and warfarin.  Lab from 04/24/17  showed a creatinine of 1.01.  TSH was pending.  Hemoglobin 15, hematocrit 43, but platelets were decreased at 1 25,000.  Total cholesterol is 124, HDL 46, triglycerides 214, and LDL 50.  Vitamin D was very low at 7.  When I saw her in February 2019, her ECG revealed that she was in atrial fibrillation for which she was asymptomatic.  At that time, I  titrated metoprolol succinate to 100 mg daily.  I also started her on 50,000 units of vitamin D weekly for at least 3 months.  She underwent a follow-up echo Doppler study on for 05/21/2017 which showed an EF of 60-65% with grade 2 diastolic dysfunction.  HerTAVR bioprosthesis was well-seated and functioned well.  There was no aortic insufficiency. Her left atrium was severely dilated.  Estimated PA pressure was 34 mm.   I saw her in March 2019 at which time she was maintaining sinus rhythm and ECG showed sinus bradycardia at 56 bpm with left bundle branch block.  She underwent a carotid study in May 2019 which showed mild carotid disease with 40 to 50% stenosis in the right internal carotid and 1 at 39% in the left.  She had normal subclavian and vertebral flow.  She over the past year, she was seen in September 2019 by Fabian Sharp, PA and was noting  some intermittent leg swelling for which she was taking Lasix as needed approximately 2-3 times per week and also using support stockings.    I saw her on May 09, 2018 presently, at which time she had noticed a rare episode of heart rate irregularity.  She denied any chest pain or shortness of breath.  She continued to be on amlodipine 5 mg,irbesartan150 mg,furosemide 20 mg for which he takes it 2-3 times per week, Toprol-XL 100 mg for hypertension and heart rate. She was on warfarin anticoagulation. GERD has been controlled with pantoprazole.  Her ECG in the office confirmed that she was back in atrial fibrillation.  Her ventricular rate was 87.  During that evaluation I discontinued amlodipine and substituted this with Cardizem CD 180 mg.  Laboratory was obtained.  She underwent a one-year follow-up echo Doppler study  on May 19, 2018.  At the time of her echo evaluation she was still in atrial fibrillation.  However, LV function remained normal.  Her TAVR valve was stable and actually gradients appeared slightly less than previously.  She was  evaluated by me in April 2020 and a telemedicine visit.  She believes her heart rhythm has stabilized.  Obviously she has not had a follow-up EKG.  Her pulse was running fairly regularly in the 60s.  She does note almost daily ankle swelling and she has increased her furosemide to 20 mg daily.  She continued to use support stockings.  She denied chest pain,recurrent palpitations, presyncope or syncope.    I saw her in August 2020  She has had purposeful weight loss of approximately 20 pounds.  She continued to be on diltiazem 180 mg, irbesartan 150 mg, Toprol-XL 100 mg for blood pressure and rate control.  She was taking furosemide as needed 2 times per week 20 mg.  She was on rosuvastatin for hyperlipidemia.  She continued to be on warfarin for anticoagulation.  During that evaluation, her ECG demonstrated that she was in atrial fibrillation with a ventricular rate at 67 bpm and had left bundle branch block with repolarization changes.  She was unaware of her atrial fibrillation.  During that evaluation I initiated amiodarone 200 mg daily.  Subsequent laboratory was obtained.  Since I saw her she has been divided by her primary physician Dr. Anastasio Champion.  I last saw her in November 2020.  At that time she was unaware of any abnormal rhythm.  She denied chest pain PND orthopnea.  She denied any dizziness.  She had noted some trivial ankle swelling.  Her INR was therapeutic at 2.6.  Her EKG at that time revealed that she had converted to sinus rhythm.  Due to mild blood pressure elevation and ankle edema I suggested that she increase her Lasix from 2 times per week and take this every other day.  She underwent a follow-up echo Doppler studies on June 22, 2019.  This revealed an EF of 60 to 65% with moderate LVH.  There was moderately elevated pulmonary artery systolic pressure with estimated right ventricular systolic pressure at 58 mmHg. Her TAVR 26 mm sapient 3 valve was stable with stable gradients with a mean  gradient of 5.3 and peak gradient of 11.6.  In June 2021 she underwent carotid duplex imaging which showed right ICA velocities consistent with a 40 to 59% stenosis in the left ICA consistent with a 1 to 39% stenosis.  Presently, she denies any chest pain.  She states her blood pressure at home typically runs around  145/64.  At times she develops  episodes of apparent recurrent atrial fibrillation and believes she had an episode 2 days previously that lasted for 30 minutes.  She has only been taking furosemide every third day.  She continues to be on amiodarone 200 mg daily, diltiazem 180 mg, irbesartan 150 mg and metoprolol succinate 100 mg daily.  She is on rosuvastatin for hyperlipidemia and takes 90 mg of thyroid pill.  She continues to be on warfarin for anticoagulation.  She presents to the office for evaluation.  Past Medical History:  Diagnosis Date  . Allergy    rhinitis  . Aortic stenosis, severe   . Aortic stenosis, severe   . Arthritis    spine and various joints  . Cataract   . Cerebrovascular disease 12/05/2018  . Childhood asthma   . Chronic bronchitis ([place])       . Claudication ([place])   . Coronary artery disease    a. April 2012 which revealed mid RCA occlusion with collaterals, 50% LAD stenosis, 30% circumflex stenosis, and 40% marginal stenosis  . GERD (gastroesophageal reflux disease)   . Heart murmur   . Hyperlipidemia   . Hypertension   . LUMBAR RADICULOPATHY, RIGHT 12/21/2007   Qualifier: Diagnosis of  By: Aline Brochure MD, Dorothyann Peng    . LUMBAR RADICULOPATHY, RIGHT   . Peripheral vascular disease ([address])   . 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 Seroma, postoperative    Left Groin  . Severe aortic valve stenosis 08/31/2014  . Stroke [facility]) 1982 X 2   "a little bit weaker on the left side since" (08/29/2014)    Past Surgical History:  Procedure  Laterality Date  . AORTO-FEMORAL BYPASS GRAFT Bilateral 06/27/10  . BASAL CELL CARCINOMA EXCISION Left    face  . CARDIAC CATHETERIZATION N/A 07/25/2014   Procedure: Right/Left Heart Cath and Coronary Angiography;  Surgeon: Troy Sine, MD;  Location: [facility];  Service: Cardiovascular;  Laterality: N/A;  . CATARACT EXTRACTION, BILATERAL Bilateral 09/29/2016  . COLONOSCOPY  2008   Dr. Laural Golden: normal.   . DILATION AND CURETTAGE OF UTERUS  "2 or 3"  . ESOPHAGOGASTRODUODENOSCOPY N/A 08/18/2017   Dr. Gala Romney: Normal-appearing esophagus status post dilation.  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  Small hiatal hernia.  Marland Kitchen MALONEY DILATION N/A 08/18/2017   Procedure: Venia Minks DILATION;  Surgeon: Daneil Dolin, MD;  Location: [facility];  Service: Endoscopy;  Laterality: N/A;  . MULTIPLE TOOTH EXTRACTIONS    . PR VEIN BYPASS GRAFT,AORTO-FEM-POP  06/12/10  . TEE WITHOUT CARDIOVERSION N/A 08/31/2014   Procedure: TRANSESOPHAGEAL ECHOCARDIOGRAM (TEE);  Surgeon: Rexene Alberts, MD;  Location: [facility];  Service: Open Heart Surgery;  Laterality: N/A;  . TRANSCATHETER AORTIC VALVE REPLACEMENT, TRANSFEMORAL N/A 08/31/2014   Procedure: TRANSCATHETER AORTIC VALVE REPLACEMENT, TRANSFEMORAL approach;  Surgeon: Rexene Alberts, MD;  Location: [facility];  Service: Open Heart Surgery;  Laterality: N/A;  . TUBAL LIGATION  1970's  . VAGINAL HYSTERECTOMY  1970's   Partial     Allergies  Allergen Reactions  . Adhesive [Tape] Rash  . Latex Rash  . Tetanus Toxoids Rash    Current Outpatient Medications  Medication Sig Dispense Refill  . amiodarone (PACERONE) 200 MG tablet Take 1 tablet (200 mg total) by mouth daily. 90 tablet 3  . Cholecalciferol (VITAMIN D3) 50 MCG (2000 UT) capsule Take 1 capsule by  mouth daily.     Marland Kitchen estradiol (ESTRACE) 2 MG tablet Take 1 tablet by mouth once daily 90 tablet 0  . ipratropium (ATROVENT) 0.06 % nasal spray USE 1 SPRAY(S) IN EACH NOSTRIL EVERY 6 HOURS AS NEEDED FOR  RHINITIS 15 mL 3  . meloxicam (MOBIC) 15 MG tablet Take 1 tablet by mouth once daily 90 tablet 0  . metoprolol succinate (TOPROL-XL) 100 MG 24 hr tablet Take 1 tablet (100 mg total) by mouth daily. 90 tablet 3  . nitroGLYCERIN (NITROSTAT) 0.4 MG SL tablet Place 1 tablet (0.4 mg total) under the tongue every 5 (five) minutes as needed for chest pain. 25 tablet 6  . pantoprazole (PROTONIX) 40 MG tablet TAKE 1 TABLET BY MOUTH ONCE DAILY BEFORE SUPPER 90 tablet 3  . potassium chloride SA (KLOR-CON) 20 MEQ tablet Take 1 tablet (20 mEq total) by mouth daily. 3 tablet 0  . rosuvastatin (CRESTOR) 20 MG tablet TAKE 1 TABLET BY MOUTH AT BEDTIME 90 tablet 0  . thyroid (NP THYROID) 90 MG tablet Take 1 tablet (90 mg total) by mouth daily. 30 tablet 3  . warfarin (COUMADIN) 2.5 MG tablet Take 1 tablet by mouth once daily 90 tablet 0  . chlorthalidone (HYGROTON) 25 MG tablet Take 0.5 tablets (12.5 mg total) by mouth daily. 45 tablet 3  . diltiazem (CARDIZEM CD) 120 MG 24 hr capsule Take 1 capsule (120 mg total) by mouth daily. 90 capsule 3  . irbesartan (AVAPRO) 300 MG tablet Take 1 tablet (300 mg total) by mouth daily. 90 tablet 3   No current facility-administered medications for this visit.    Social History   Socioeconomic History  . Marital status: Married    Spouse name: bobby  . Number of children: 1  . Years of education: Not on file  . Highest education level: High school graduate  Occupational History  . 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 Smoking status: Former Smoker    Packs/day: 2.00    Years: 56.00    Pack years: 112.00    Types: Cigarettes    Start date: 03/02/1952    Quit date: 06/01/2010    Years since quitting: 9.6  . Smokeless tobacco: Never Used  . Tobacco comment: HAD WEANED DOWN TO 1/4 PK A DAY BEFORE D/C  Vaping Use  . Vaping Use: Never used  Substance and Sexual Activity  . Alcohol use: No    Alcohol/week: 0.0 standard drinks    Comment:  08/29/2014 "never drank much; quit in the 1990's"  . Drug use: No  . Sexual activity: Yes    Partners: Male  Other Topics Concern  . Not on file  Social History Narrative   [place] husband in November 2020. Retired,used to work at [employer].Lives alone.         Patient has one daughter, 3 grands   Likes to crochet   Works in [employer].    Social Determinants of Health   Financial Resource Strain:   . Difficulty of Paying Living Expenses: Not on file  Food Insecurity:   . Worried About Charity fundraiser in the Last Year: Not on file  . Ran Out of Food in the Last Year: Not on file  Transportation Needs:   . Lack of Transportation (Medical): Not on file  . Lack of Transportation (Non-Medical): Not on file  Physical Activity:   . Days of Exercise per Week: Not on file  .  Minutes of Exercise per Session: Not on file  Stress:   . Feeling of Stress : Not on file  Social Connections:   . Frequency of Communication with Friends and Family: Not on file  . Frequency of Social Gatherings with Friends and Family: Not on file  . Attends Religious Services: Not on file  . Active Member of Clubs or Organizations: Not on file  . Attends Archivist Meetings: Not on file  . Marital Status: Not on file  Intimate Partner Violence:   . Fear of Current or Ex-Partner: Not on file  . Emotionally Abused: Not on file  . Physically Abused: Not on file  . Sexually Abused: Not on file    Family History  Problem Relation Age of Onset  . Heart disease Mother 63       died young  . Varicose Veins Mother   . Stroke Father   . Hypertension Father   . Heart disease Father 26       Aneurysm  . Heart attack Father   . Arthritis Brother   . Stroke Brother   . Diabetes Daughter   . Arthritis Daughter   . Allergic rhinitis Neg Hx   . Angioedema Neg Hx   . Asthma Neg Hx   . Atopy Neg Hx   . Eczema Neg Hx   . Immunodeficiency Neg Hx   . Urticaria Neg Hx    Social history  is notable that she is married has one child. She quit tobacco in April 2012 after a long-standing tobacco history having started smoking at age 19.   ROS General: Negative; No fevers, chills, or night sweats;  HEENT: Negative; No changes in vision or hearing, sinus congestion, difficulty swallowing Pulmonary: Negative; No cough, wheezing, shortness of breath, hemoptysis Cardiovascular: See history of present illness.   GI: Negative; No nausea, vomiting, diarrhea, or abdominal pain GU: Negative; No dysuria, hematuria, or difficulty voiding Musculoskeletal: Negative; no myalgias, joint pain, or weakness Hematologic/Oncology: Negative; no easy bruising, bleeding Endocrine: Negative; no heat/cold intolerance; no diabetes Neuro: Negative; no changes in balance, headaches Skin: Negative; No rashes or skin lesions Psychiatric: Negative; No behavioral problems, depression Sleep: Negative; No snoring, daytime sleepiness, hypersomnolence, bruxism, restless legs, hypnogognic hallucinations, no cataplexy Other comprehensive 14 point system review is negative.   PE BP (!) 198/82   Pulse (!) 47   Ht '5\' 10"'  (1.778 m)   Wt 185 lb 12.8 oz (84.3 kg)   BMI 26.66 kg/m    Repeat blood pressure by me was 188/80  Wt Readings from Last 3 Encounters:  01/22/20 185 lb 12.8 oz (84.3 kg)  12/14/19 184 lb (83.5 kg)  06/14/19 176 lb 9.6 oz (80.1 kg)   General: Alert, oriented, no distress.  Skin: normal turgor, no rashes, warm and dry HEENT: Normocephalic, atraumatic. Pupils equal round and reactive to light; sclera anicteric; extraocular muscles intact; Nose without nasal septal hypertrophy Mouth/Parynx benign; Mallinpatti scale Neck: No JVD, no carotid bruits; normal carotid upstroke Lungs: clear to ausculatation and percussion; no wheezing or rales Chest wall: without tenderness to palpitation Heart: PMI not displaced, bradycardic at 47, s1 s2 normal, 6-6/0 systolic murmur in aortic area consistent  with her TAVR; no diastolic murmur, no rubs, gallops, thrills, or heaves Abdomen: soft, nontender; no hepatosplenomehaly, BS+; abdominal aorta nontender and not dilated by palpation. Back: no CVA tenderness Pulses 2+ Musculoskeletal: full range of motion, normal strength, no joint deformities Extremities: Trivial ankle edema; no clubbing cyanosis, Homan's  sign negative  Neurologic: grossly nonfocal; Cranial nerves grossly wnl Psychologic: Normal mood and affect    ECG (independently read by me): Sinus Bradycardia at 47; 1st degree block; PR 228 msec; LBBB  November 2020 ECG (independently read by me): Sinus bradycardia 51 bpm, first-degree AV block, left bundle branch block with repolarization changes.  August 2020 ECG (independently read by me): Atrial Fibrillation at 67; LBBB with repolarization  March 2020 ECG (independently read by me): Atrial fibrillation with ventricular rate at 87.  Left bundle branch block with repolarization changes.  March 2019 ECG (independently read by me): sinus bradycardia 56 bpm.  Left bundle branch block.  PR interval 198 ms.  04/14/2017 ECG (independently read by me): Atrial fibrillation at 77 bpm left bundle branch block.  Left axis deviation.  QTc interval 520 ms  August 2018 ECG (independently read by me): Sinus bradycardia with occasional PACs.  Left axis deviation.  Borderline first-degree AV block with a PR interval of 22 ms.  Left bundle-branch block  March 2018 ECG (independently read by me): Sinus bradycardia 52 bpm.  First-degree AV block with a PR interval of 210 ms.  Left bundle branch block with repolarization changes.  PE the C.  January 2018 ECG (independently read by me): Atrial fibrillation with a rate at 91.  Left bundle-branch block.  December 2017 ECG (independently read by me): Sinus bradycardia 57 bpm.  Left bundle branch block with repolarization changes.  October 2017 ECG (independently read by me): Sinus rhythm at 74 bpm with  frequent PVCs in a trigeminal pattern.  March 2017 ECG (independently read by me): Sinus bradycardia 54 bpm.  Left bundle branch block with repolarization changes.  November 2016 ECG (independently read by me): Sinus bradycardia 55 bpm with occasional premature atrial complex.  Left bundle-branch block.  February 2016 ECG (independently read by me):  Sinus bradycardia 57 bpm.  Left bundle branch block with repolarization changes..  No ectopy.  December 2015 ECG (independently read by me): Sinus bradycardia 58 bpm.  Mild left atrial enlargement.  Left axis deviation.  Left bundle branch block with repolarization changes.  October 2014 ECG: Sinus bradycardia with left axis deviation. Left bundle branch block with repolarization changes  LAB BMP Latest Ref Rng & Units 12/14/2019 06/14/2019 03/14/2019  Glucose 65 - 99 mg/dL 86 102(H) 94  BUN 7 - 25 mg/dL '20 17 20  ' Creatinine 0.60 - 0.93 mg/dL 1.03(H) 1.10(H) 1.18(H)  BUN/Creat Ratio 6 - 22 (calc) '19 15 17  ' Sodium 135 - 146 mmol/L 140 139 139  Potassium 3.5 - 5.3 mmol/L 4.6 4.6 5.0  Chloride 98 - 110 mmol/L 105 105 106  CO2 20 - 32 mmol/L '28 30 27  ' Calcium 8.6 - 10.4 mg/dL 8.5(L) 8.6 8.6   Hepatic Function Latest Ref Rng & Units 12/14/2019 06/14/2019 03/14/2019  Total Protein 6.1 - 8.1 g/dL 6.2 6.3 6.0(L)  Albumin 3.5 - 5.0 g/dL - - -  AST 10 - 35 U/L '16 16 15  ' ALT 6 - 29 U/L '15 9 8  ' Alk Phosphatase 38 - 126 U/L - - -  Total Bilirubin 0.2 - 1.2 mg/dL 0.9 0.8 0.7   CBC Latest Ref Rng & Units 02/18/2019 05/17/2018 04/13/2017  WBC 4.0 - 10.5 K/uL 2.9(L) 7.5 5.7  Hemoglobin 12.0 - 15.0 g/dL 16.8(H) 15.4 15.0  Hematocrit 36 - 46 % 52.0(H) 47.0(H) 43.8  Platelets 150 - 400 K/uL 99(L) 163 125(L)   Lab Results  Component Value Date   MCV  93.0 02/18/2019   MCV 92 05/17/2018   MCV 89.9 04/13/2017   Lab Results  Component Value Date   TSH 1.98 12/14/2019   Lipid Panel     Component Value Date/Time   CHOL 133 12/14/2019 1317   CHOL 126  11/21/2018 1146   TRIG 105 12/14/2019 1317   HDL 57 12/14/2019 1317   HDL 51 11/21/2018 1146   CHOLHDL 2.3 12/14/2019 1317   VLDL 40 (H) 01/16/2015 1028   LDLCALC 57 12/14/2019 1317     RADIOLOGY: No results found.  ECHO: 06/22/2019 IMPRESSIONS  1. Left ventricular ejection fraction, by estimation, is 60 to 65%. The  left ventricle has normal function. The left ventricle has no regional  wall motion abnormalities. There is moderate left ventricular hypertrophy.  Left ventricular diastolic  parameters are indeterminate.  2. Right ventricular systolic function is normal. The right ventricular  size is normal. There is moderately elevated pulmonary artery systolic  pressure.  3. Left atrial size was moderately dilated.  4. The mitral valve is normal in structure. Mild mitral valve  regurgitation. No evidence of mitral stenosis.  5. Post TAVR with 26 mm Sapien 3 valve stable gradients since 05/19/18 and  no PVL. The aortic valve has been repaired/replaced. Aortic valve  regurgitation is not visualized. No aortic stenosis is present.  6. The inferior vena cava is normal in size with greater than 50%  respiratory variability, suggesting right atrial pressure of 3 mmHg.   FINDINGS  Left Ventricle: Left ventricular ejection fraction, by estimation, is 60  to 65%. The left ventricle has normal function. The left ventricle has no  regional wall motion abnormalities. The left ventricular internal cavity  size was normal in size. There is  moderate left ventricular hypertrophy. Left ventricular diastolic  parameters are indeterminate.   Right Ventricle: The right ventricular size is normal. No increase in  right ventricular wall thickness. Right ventricular systolic function is  normal. There is moderately elevated pulmonary artery systolic pressure.  The tricuspid regurgitant velocity is  3.46 m/s, and with an assumed right atrial pressure of 10 mmHg, the  estimated right  ventricular systolic pressure is 16.1 mmHg.   Left Atrium: Left atrial size was moderately dilated.   Right Atrium: Right atrial size was normal in size.   Pericardium: There is no evidence of pericardial effusion.   Mitral Valve: The mitral valve is normal in structure. There is moderate  thickening of the mitral valve leaflet(s). There is moderate calcification  of the mitral valve leaflet(s). Normal mobility of the mitral valve  leaflets. Moderate mitral annular  calcification. Mild mitral valve regurgitation. No evidence of mitral  valve stenosis. MV peak gradient, 17.5 mmHg. The mean mitral valve  gradient is 3.0 mmHg.   Tricuspid Valve: The tricuspid valve is normal in structure. Tricuspid  valve regurgitation is mild . No evidence of tricuspid stenosis.   Aortic Valve: Post TAVR with 26 mm Sapien 3 valve stable gradients since  05/19/18 and no PVL. The aortic valve has been repaired/replaced. Aortic  valve regurgitation is not visualized. No aortic stenosis is present.  Aortic valve mean gradient measures 5.3  mmHg. Aortic valve peak gradient measures 11.6 mmHg.   Pulmonic Valve: The pulmonic valve was normal in structure. Pulmonic valve  regurgitation is not visualized. No evidence of pulmonic stenosis.   Aorta: The aortic root is normal in size and structure.   Venous: The inferior vena cava is normal in size with greater than 50%  respiratory variability, suggesting right atrial pressure of 3 mmHg.   IAS/Shunts: No atrial level shunt detected by color flow Doppler.     LEFT VENTRICLE  PLAX 2D  LVIDd:     3.80 cm Diastology  LVIDs:     2.70 cm LV e' lateral:  4.70 cm/s  LV PW:     1.10 cm LV E/e' lateral: 34.3  LV IVS:    1.60 cm LV e' medial:  4.62 cm/s             LV E/e' medial: 34.8     RIGHT VENTRICLE  RV Basal diam: 2.30 cm  RV S prime:   9.73 cm/s  TAPSE (M-mode): 1.5 cm  RVSP:      55.9 mmHg   LEFT ATRIUM        Index    RIGHT ATRIUM      Index  LA diam:    4.80 cm 2.42 cm/m RA Pressure: 8.00 mmHg  LA Vol (A2C):  59.0 ml 29.80 ml/m RA Area:   13.60 cm  LA Vol (A4C):  31.2 ml 15.76 ml/m RA Volume:  28.50 ml 14.40 ml/m  LA Biplane Vol: 46.0 ml 23.23 ml/m  AORTIC VALVE  AV Vmax:      170.33 cm/s  AV Vmean:     107.633 cm/s  AV VTI:      0.427 m  AV Peak Grad:   11.6 mmHg  AV Mean Grad:   5.3 mmHg  LVOT Vmax:     117.00 cm/s  LVOT Vmean:    78.700 cm/s  LVOT VTI:     0.315 m  LVOT/AV VTI ratio: 0.74   MITRAL VALVE        TRICUSPID VALVE  MV Area (PHT): 3.85 cm   TR Peak grad:  47.9 mmHg  MV Peak grad: 17.5 mmHg  TR Vmax:    346.00 cm/s  MV Mean grad: 3.0 mmHg   Estimated RAP: 8.00 mmHg  MV Vmax:    2.09 m/s   RVSP:      55.9 mmHg  MV Vmean:   78.9 cm/s  MV Decel Time: 197 msec   SHUNTS  MR Peak grad: 77.4 mmHg   Systemic VTI: 0.32 m  MR Mean grad: 35.0 mmHg  MR Vmax:   440.00 cm/s  MR Vmean:   254.0 cm/s  MV E velocity: 161.00 cm/s  MV A velocity: 71.50 cm/s  MV E/A ratio: 2.25    IMPRESSION: 1. PAF (paroxysmal atrial fibrillation) ([place])   2. Essential hypertension   3. CAD in native artery   4. S/P TAVR (transcatheter aortic valve replacement)   5. Coronary artery disease involving native coronary artery of native heart without angina pectoris   6. Sinus bradycardia   7. Hyperlipidemia with target LDL less than 70   8. Moderate pulmonary hypertension ([place])   9. Encounter for monitoring amiodarone therapy   10. Warfarin anticoagulation   11. Peripheral vascular disease [facility])     ASSESSMENT AND PLAN: Ms. Goebel is a 79 year old female who has documented CAD and PVD who developed progressive aortic valve stenosis and underwent successful TAVR procedure on 08/31/2014 with placement of a 26 mm Edward Sapien 3 Valve from the right femoral approach.  She later developed  stage II hypertension resulting in increased medical regimen.   She developed postoperative atrial fibrillation and previouslyconverted back to sinus rhythm on her increased beta blocker dose and has been on warfarin for anticoagulation. An echo Doppler study  in July 2018 continued to show normal systolic function.  Her bioprosthetic TAVR valve was functioning well without mobility restriction.  The mean gradient was 10 with a peak gradient of 21.  When I saw her on 04/14/2017, her ECG demonstrated recurrent atrial fibrillation of questionable duration and metoprolol succinate was further titrated titrated up to 100 mg daily.  An echo Doppler study in March 2019 revealed normal systolic function, and grade 2 diastolic dysfunction.  Her bioprosthetic valve was functioning well.  The mean gradient was 12 and peak gradient 25.  There was no aortic insufficiency.  PA peak pressure was 34 mm.  Over the last several years she has had recurrent episodes of paroxysmal atrial fibrillation and in 2020 amiodarone was initiated.  Most recently, she has been on a regimen consisting of amiodarone 200 mg daily, diltiazem 180 mg daily, furosemide 20 mg every third day, irbesartan 150 mg daily and metoprolol succinate 100 mg daily.  She believes she had had an episode of atrial fibrillation several days ago which lasted for at least 30 minutes.  Her blood pressure today is significantly elevated and her blood pressure at home has typically run high.  Presently I am recommending further titration of irbesartan from 150 mg to 300 mg daily.  I have recommended she discontinue furosemide since she has been taking this only every third day and I am starting her on chlorthalidone 12.5 mg daily.  She continues to be on amiodarone and with her recurrent episodes I will not further reduced her dose.  We will need to monitor thyroid and LFTs.  With her significant bradycardia today I have recommended she decrease diltiazem from 180 mg down to  120 mg.  I am also recommending she undergo renal duplex imaging in light of her peripheral vascular disease to make certain she does not have any renal vascular etiology such as renal artery stenosis contributing to her accelerated hypertension.  She continues to be on rosuvastatin for hyperlipidemia.  LDL cholesterol on December 14, 2019 was 57.  I will check a be met in 2 weeks.  I have scheduled her to see our pharmacist in 4 to 6 weeks for follow-up evaluation.  I will see her in 3 months for follow-up evaluation with me.   Troy Sine, MD, [facility]  01/24/2020 9:35 AM

## 2020-01-23 ENCOUNTER — Encounter (INDEPENDENT_AMBULATORY_CARE_PROVIDER_SITE_OTHER): Payer: Self-pay

## 2020-01-23 LAB — POCT INR: INR: 2.7 — AB (ref 0.9–1.1)

## 2020-01-24 ENCOUNTER — Encounter: Payer: Self-pay | Admitting: Cardiovascular Disease

## 2020-01-24 ENCOUNTER — Other Ambulatory Visit (INDEPENDENT_AMBULATORY_CARE_PROVIDER_SITE_OTHER): Payer: Self-pay | Admitting: Internal Medicine

## 2020-01-26 ENCOUNTER — Other Ambulatory Visit (INDEPENDENT_AMBULATORY_CARE_PROVIDER_SITE_OTHER): Payer: Self-pay | Admitting: Internal Medicine

## 2020-01-29 ENCOUNTER — Telehealth (INDEPENDENT_AMBULATORY_CARE_PROVIDER_SITE_OTHER): Payer: Self-pay | Admitting: Nurse Practitioner

## 2020-01-29 ENCOUNTER — Ambulatory Visit (INDEPENDENT_AMBULATORY_CARE_PROVIDER_SITE_OTHER): Payer: Self-pay | Admitting: Nurse Practitioner

## 2020-01-29 DIAGNOSIS — Z7901 Long term (current) use of anticoagulants: Secondary | ICD-10-CM | POA: Diagnosis not present

## 2020-01-29 LAB — POCT INR: INR: 3.1 — AB (ref 2–3)

## 2020-01-29 NOTE — Telephone Encounter (Signed)
Called patient and gave her the message from Judson Roch. Patient verbalized an understanding.

## 2020-01-29 NOTE — Telephone Encounter (Signed)
Please call patient and let her know that I recommended she skip the next dose of her coumadin. Encourage her to test INR again next week.

## 2020-01-29 NOTE — Progress Notes (Signed)
Will have staff call patient and recommend she skip one dose. Retest INR next week.

## 2020-02-05 ENCOUNTER — Telehealth (INDEPENDENT_AMBULATORY_CARE_PROVIDER_SITE_OTHER): Payer: Self-pay | Admitting: Nurse Practitioner

## 2020-02-05 ENCOUNTER — Ambulatory Visit (INDEPENDENT_AMBULATORY_CARE_PROVIDER_SITE_OTHER): Payer: Self-pay | Admitting: Nurse Practitioner

## 2020-02-05 LAB — POCT INR: INR: 2.9 (ref 2–3)

## 2020-02-05 NOTE — Telephone Encounter (Signed)
Please call this patient and let her know that she should continue same dose of warfarin and retest her INR next week.

## 2020-02-05 NOTE — Progress Notes (Signed)
Will call patient and let her know that she should continue current dose and retest next week.

## 2020-02-05 NOTE — Telephone Encounter (Signed)
Called patient and gave her the message from Judson Roch. Patient verbalized an understanding.

## 2020-02-07 ENCOUNTER — Telehealth: Payer: Self-pay | Admitting: Cardiovascular Disease

## 2020-02-07 NOTE — Telephone Encounter (Signed)
Spoke with patient and advised how to manually check HR for 1 minute  Patient checked 4 times and got 38, 36, and 40 twice. Checked with her blood pressure machine and got 41 Patient stated she was feeling fine now and only had a brief episode of feeling dizzy this morning  Discussed with Dr Claiborne Billings and will have her decrease her Metoprolol Succ 100 mg daily to 1/2 tablet daily Advised patient, verbalized understanding.

## 2020-02-07 NOTE — Telephone Encounter (Signed)
Pt c/o BP issue: STAT if pt c/o blurred vision, one-sided weakness or slurred speech  1. What are your last 5 BP readings? Yesterday 118/58 and pulse 35, today it is 125/58 and pulse 32  2. Are you having any other symptoms (ex. Dizziness, headache, blurred vision, passed out)? Dizzy yesterday  3. What is your BP issue? Blood pressure running low*

## 2020-02-12 ENCOUNTER — Encounter (INDEPENDENT_AMBULATORY_CARE_PROVIDER_SITE_OTHER): Payer: Self-pay

## 2020-02-12 ENCOUNTER — Telehealth (INDEPENDENT_AMBULATORY_CARE_PROVIDER_SITE_OTHER): Payer: Self-pay

## 2020-02-12 LAB — POCT INR: INR: 2.2 — AB (ref 0.9–1.1)

## 2020-02-12 NOTE — Telephone Encounter (Signed)
Called patient to let her know to continue the same dose of Coumadin per her INR of 2.2. Patient verbalized an understanding.

## 2020-02-14 DIAGNOSIS — I2510 Atherosclerotic heart disease of native coronary artery without angina pectoris: Secondary | ICD-10-CM | POA: Diagnosis not present

## 2020-02-14 DIAGNOSIS — I10 Essential (primary) hypertension: Secondary | ICD-10-CM | POA: Diagnosis not present

## 2020-02-15 LAB — BASIC METABOLIC PANEL
BUN/Creatinine Ratio: 24 (ref 12–28)
BUN: 33 mg/dL — ABNORMAL HIGH (ref 8–27)
CO2: 23 mmol/L (ref 20–29)
Calcium: 9.5 mg/dL (ref 8.7–10.3)
Chloride: 103 mmol/L (ref 96–106)
Creatinine, Ser: 1.37 mg/dL — ABNORMAL HIGH (ref 0.57–1.00)
GFR calc Af Amer: 43 mL/min/{1.73_m2} — ABNORMAL LOW (ref >59)
GFR calc non Af Amer: 37 mL/min/{1.73_m2} — ABNORMAL LOW (ref >59)
Glucose: 123 mg/dL — ABNORMAL HIGH (ref 65–99)
Potassium: 4.9 mmol/L (ref 3.5–5.2)
Sodium: 142 mmol/L (ref 134–144)

## 2020-02-18 ENCOUNTER — Other Ambulatory Visit (INDEPENDENT_AMBULATORY_CARE_PROVIDER_SITE_OTHER): Payer: Self-pay | Admitting: Internal Medicine

## 2020-02-19 ENCOUNTER — Other Ambulatory Visit: Payer: Self-pay | Admitting: Cardiovascular Disease

## 2020-02-19 LAB — POCT INR: INR: 3 (ref 2–3)

## 2020-02-20 ENCOUNTER — Telehealth (INDEPENDENT_AMBULATORY_CARE_PROVIDER_SITE_OTHER): Payer: Self-pay | Admitting: Nurse Practitioner

## 2020-02-20 ENCOUNTER — Ambulatory Visit (INDEPENDENT_AMBULATORY_CARE_PROVIDER_SITE_OTHER): Payer: Self-pay | Admitting: Nurse Practitioner

## 2020-02-20 NOTE — Telephone Encounter (Signed)
Please call this patient and let her know that her INR is within goal. She should continue her current dose of warfarin. Please tell her that next week our office is closed so if her INR is above 3.0 when she checks it on 02/26/20 she needs to call the on-call number.  I believe this is [phone].  She should listen for the option to speak to the operator and request that she be put through to the on-call provider for Dr. Anastasio Champion.  Please let me know if she has any questions.

## 2020-02-20 NOTE — Telephone Encounter (Signed)
Called patient and gave her the message from Judson Roch. Patient verbalized an understanding and I gave her the on call number.

## 2020-02-26 DIAGNOSIS — Z7901 Long term (current) use of anticoagulants: Secondary | ICD-10-CM | POA: Diagnosis not present

## 2020-02-26 LAB — POCT INR: INR: 2.3 — AB (ref 0.9–1.1)

## 2020-02-27 ENCOUNTER — Telehealth (INDEPENDENT_AMBULATORY_CARE_PROVIDER_SITE_OTHER): Payer: Self-pay

## 2020-02-27 ENCOUNTER — Encounter (INDEPENDENT_AMBULATORY_CARE_PROVIDER_SITE_OTHER): Payer: Self-pay

## 2020-02-27 NOTE — Telephone Encounter (Signed)
Received fax that her INR results from 02/26/2020 is 2.3.  Okay to call patient to advised to stay on current coumadin dose?

## 2020-02-27 NOTE — Telephone Encounter (Signed)
I am receiving this message after Katelyn [name] has left for the day. Please call this patient on 02/28/20 to tell her she can continue on her current dose of warfarin and to test INR again next week. Thank you.

## 2020-02-28 NOTE — Telephone Encounter (Signed)
Pt was called today and given instructions. Also gave safety on COVID currently on family and gatherings. To be safe & wear mask, was hands, and limit visit times. To protect her self and conditions.

## 2020-03-04 ENCOUNTER — Ambulatory Visit (INDEPENDENT_AMBULATORY_CARE_PROVIDER_SITE_OTHER): Payer: Self-pay | Admitting: Nurse Practitioner

## 2020-03-04 ENCOUNTER — Telehealth (INDEPENDENT_AMBULATORY_CARE_PROVIDER_SITE_OTHER): Payer: Self-pay | Admitting: Nurse Practitioner

## 2020-03-04 LAB — POCT INR: INR: 2.3 (ref 2–3)

## 2020-03-04 NOTE — Telephone Encounter (Signed)
Please call this patient and let her know that her INR is at goal and she should continue her current dose of coumadin and recheck INR next week.

## 2020-03-04 NOTE — Progress Notes (Signed)
Patient will be called and told to continue current dose of coumadin and recheck INR next week.

## 2020-03-04 NOTE — Telephone Encounter (Signed)
Called patient and gave her the information from Judson Roch. Patient verbalized an understanding and thanked [place].

## 2020-03-06 ENCOUNTER — Other Ambulatory Visit: Payer: Self-pay

## 2020-03-06 ENCOUNTER — Ambulatory Visit (INDEPENDENT_AMBULATORY_CARE_PROVIDER_SITE_OTHER): Payer: Medicare Other | Admitting: Pharmacist Clinician (PhC)/ Clinical Pharmacy Specialist

## 2020-03-06 ENCOUNTER — Ambulatory Visit (HOSPITAL_COMMUNITY)
Admission: RE | Admit: 2020-03-06 | Discharge: 2020-03-06 | Disposition: A | Discharge status: Home / Self Care | Payer: Medicare Other | Source: Ambulatory Visit | Attending: Cardiology | Admitting: Cardiology

## 2020-03-06 VITALS — BP 172/68 | HR 52 | Ht 70.0 in | Wt 184.0 lb

## 2020-03-06 DIAGNOSIS — I10 Essential (primary) hypertension: Secondary | ICD-10-CM

## 2020-03-06 DIAGNOSIS — I2510 Atherosclerotic heart disease of native coronary artery without angina pectoris: Secondary | ICD-10-CM | POA: Diagnosis not present

## 2020-03-06 MED ORDER — METOPROLOL SUCCINATE ER 25 MG PO TB24: 25.0000 mg | ORAL_TABLET | Freq: Every day | ORAL | 3 refills | Status: DC

## 2020-03-06 NOTE — Patient Instructions (Signed)
Return for a a follow up appointment January 27 at 11 am  Go to the lab Friday or Monday to check kidney function  Check your blood pressure at home twice daily and keep record of the readings.  See if you can get an [employer] blood pressure cuff.  If you cannot find one, another brand is fine, but be sure to keep the receipt so you can return it if it doesn't work accurately  Take your BP meds as follows:  Stop current metoprolol prescription and start metoprolol succinate 25 mg once daily  Continue with all other medications  Bring all of your meds, your BP cuff and your record of home blood pressures to your next appointment.  Exercise as you're able, try to walk approximately 30 minutes per day.  Keep salt intake to a minimum, especially watch canned and prepared boxed foods.  Eat more fresh fruits and vegetables and fewer canned items.  Avoid eating in fast food restaurants.    HOW TO TAKE YOUR BLOOD PRESSURE: . Rest 5 minutes before taking your blood pressure. .  Don't smoke or drink caffeinated beverages for at least 30 minutes before. . Take your blood pressure before (not after) you eat. . Sit comfortably with your back supported and both feet on the floor (don't cross your legs). . Elevate your arm to heart level on a table or a desk. . Use the proper sized cuff. It should fit smoothly and snugly around your bare upper arm. There should be enough room to slip a fingertip under the cuff. The bottom edge of the cuff should be 1 inch above the crease of the elbow. . Ideally, take 3 measurements at one sitting and record the average.

## 2020-03-06 NOTE — Assessment & Plan Note (Signed)
Patient with essential hypertension, currently not to goal.  Because of her low heart rate, will have her further cut the metoprolol succ to 25 mg daily.  Would like to increase chlorthalidone, but will need to have her repeat BMET first to see if any improvement in kidney function.  Because she has not had anything to drink today, will have her hydrate normally for the next two days and repeat labs on Friday or Monday in [place].  If those labs look good can consider increasing chlorthalidone to 25 mg.  Could also consider switching diltiazem to amlodipine for better control.  She was asked to purchase a new home BP monitor and record home readings, along with pulse, for the next month.  We will see her back at that time and make any further adjustments.

## 2020-03-06 NOTE — Progress Notes (Signed)
03/06/2020 Katelyn [name] 1941-01-23 [id number]   HPI:  Katelyn [name] is a 80 y.o. female patient of Dr Claiborne Billings, with a PMH below who presents today for hypertension clinic evaluation.  She has followed with Dr. Claiborne Billings for years, but didn't have BP increase significantly until this year.  At her last visit with him it was noted to be 198/82, with a pulse of 47.  At that time he increased her irbesartan to 300 mg and added chlorthalidone 12.5 mg.  Labs drawn 2-3 weeks later showed a significant increase in SCr from 1.03 to 1.37.  He asked that she be sure to increase hydration.  He also ordered a renal doppler to check for any renal artery stenosis.  Patient returns to office today for follow up.  She had renal dopplers done this morning, and has had nothing to eat or drink since midnight.  She feels well overall, no problems with the medication changes at her last visit.  She had been monitoring her pressure and HR at home, and called into the office in early December when her heart rate dropped to the high 30's.  Her metoprolol succinate was cut from 100 to 50 mg daily.  She has not been able to continue with home monitoring much lately, as she was using her daughter's cuff, and the daughter needed it back to monitor her own elevated pressures.  In reviewing her medication list, it is noted that she take meloxicam.  Patient states she uses this several days per week, especially in damp/rainy weather because of arthritis pain.  She is also still taking a potassium supplement, although she stopped the furosemide (taking q3d) at her last visit.     Past Medical History: ASCVD Infrarenal abdominal aorta occlusion, Aortobifemoral bypass; chronically occluded RCA w/ left to right collaterals; chart indicates prior stroke x 2 in 1982  Aortic stenosis TAVR 2016  AF Developed post TAVR, reappeared in 2018, Superior score 7  Hyperlipidemia 10/21: TC 133, TG 105, HDL 57, LDL 57 - on rosuvastatin 20      Blood Pressure Goal:  130/80  Current Medications: chlorthalidone 12.5 mg qd, diltiazem CD 120 mg qd, irbesartan 300 mg qd, metoprolol succ 100 mg qd (takes all at night)  Family Hx: father with heart disease, died around 40; mother had epilepsy died at 48. 2 brothers deceased, 1 with multiple stroke both died in 78's; 1 daughter with AF, DM  Social Hx: no tobacco, quit in 2012; no alcohol, 2 cups of coffee a day at the most  Diet: mostly home cooked, adds some salt with cooking; likes salads, cooked greens; mostly canned vegetables - rinses to gete salt down or gets low sodium/no salt  Exercise: walks dog, daschund  Home BP readings: no readings today 135/60, heart rate as low as 35; diastolic, mostly 40-97'D, systolic not below 532, as high as 150's  Intolerances: no cardiac medications, is allergic to latex  Labs: 12/21:  Na 142, K 4.9, Glu 123, BUN 33, SCr 1.37  Wt Readings from Last 3 Encounters:  03/06/20 184 lb (83.5 kg)  01/22/20 185 lb 12.8 oz (84.3 kg)  12/14/19 184 lb (83.5 kg)   BP Readings from Last 3 Encounters:  03/06/20 (!) 172/68  01/22/20 (!) 198/82  12/14/19 (!) 142/78   Pulse Readings from Last 3 Encounters:  03/06/20 (!) 52  01/22/20 (!) 47  12/14/19 (!) 57    Current Outpatient Medications  Medication Sig Dispense Refill  .  amiodarone (PACERONE) 200 MG tablet Take 1 tablet by mouth once daily 90 tablet 3  . chlorthalidone (HYGROTON) 25 MG tablet Take 0.5 tablets (12.5 mg total) by mouth daily. 45 tablet 3  . Cholecalciferol (VITAMIN D3) 50 MCG (2000 UT) capsule Take 1 capsule by mouth daily.     Marland Kitchen diltiazem (CARDIZEM CD) 120 MG 24 hr capsule Take 1 capsule (120 mg total) by mouth daily. 90 capsule 3  . estradiol (ESTRACE) 2 MG tablet Take 1 tablet by mouth once daily 90 tablet 0  . ipratropium (ATROVENT) 0.06 % nasal spray USE 1 SPRAY(S) IN EACH NOSTRIL EVERY 6 HOURS AS NEEDED FOR RHINITIS 15 mL 2  . irbesartan (AVAPRO) 300 MG tablet Take 1  tablet (300 mg total) by mouth daily. 90 tablet 3  . meloxicam (MOBIC) 15 MG tablet Take 1 tablet by mouth once daily 90 tablet 0  . metoprolol succinate (TOPROL-XL) 100 MG 24 hr tablet Take 100 mg by mouth as directed. Take 1/2 tablet daily Take with or immediately following a meal.    . nitroGLYCERIN (NITROSTAT) 0.4 MG SL tablet Place 1 tablet (0.4 mg total) under the tongue every 5 (five) minutes as needed for chest pain. 25 tablet 6  . pantoprazole (PROTONIX) 40 MG tablet TAKE 1 TABLET BY MOUTH ONCE DAILY BEFORE SUPPER 90 tablet 3  . potassium chloride SA (KLOR-CON) 20 MEQ tablet Take 1 tablet (20 mEq total) by mouth daily. 3 tablet 0  . rosuvastatin (CRESTOR) 20 MG tablet TAKE 1 TABLET BY MOUTH AT BEDTIME 90 tablet 0  . warfarin (COUMADIN) 2.5 MG tablet Take 1 tablet by mouth once daily 90 tablet 0  . NP THYROID 90 MG tablet Take 1 tablet by mouth once daily 30 tablet 3   No current facility-administered medications for this visit.    Allergies  Allergen Reactions  . Adhesive [Tape] Rash  . Latex Rash  . Tetanus Toxoids Rash    Past Medical History:  Diagnosis Date  . Allergy    rhinitis  . Aortic stenosis, severe   . Aortic stenosis, severe   . Arthritis    spine and various joints  . Cataract   . Cerebrovascular disease 12/05/2018  . Childhood asthma   . Chronic bronchitis ([place])       . Claudication ([place])   . Coronary artery disease    a. April 2012 which revealed mid RCA occlusion with collaterals, 50% LAD stenosis, 30% circumflex stenosis, and 40% marginal stenosis  . GERD (gastroesophageal reflux disease)   . Heart murmur   . Hyperlipidemia   . Hypertension   . LUMBAR RADICULOPATHY, RIGHT 12/21/2007   Qualifier: Diagnosis of  By: Aline Brochure MD, Dorothyann Peng    . LUMBAR RADICULOPATHY, RIGHT   . Peripheral vascular disease ([place])   . 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   26 mm Edwards Sapien 3 transcatheter heart valve placed via open right  transfemoral approach  . Seroma, postoperative    Left Groin  . Severe aortic valve stenosis 08/31/2014  . Stroke [facility]) 1982 X 2   "a little bit weaker on the left side since" (08/29/2014)    Blood pressure (!) 172/68, pulse (!) 52, height 5\' 10"  (1.778 m), weight 184 lb (83.5 kg).  Essential hypertension Patient with essential hypertension, currently not to goal.  Because of her low heart rate, will have her further cut the metoprolol succ to 25 mg daily.  Would like to increase chlorthalidone, but will need to  have her repeat BMET first to see if any improvement in kidney function.  Because she has not had anything to drink today, will have her hydrate normally for the next two days and repeat labs on Friday or Monday in [place].  If those labs look good can consider increasing chlorthalidone to 25 mg.  Could also consider switching diltiazem to amlodipine for better control.  She was asked to purchase a new home BP monitor and record home readings, along with pulse, for the next month.  We will see her back at that time and make any further adjustments.     Tommy Medal PharmD CPP [facility] [address] [employer] [place], [place] [address] [phone]

## 2020-03-08 DIAGNOSIS — I10 Essential (primary) hypertension: Secondary | ICD-10-CM | POA: Diagnosis not present

## 2020-03-08 LAB — BASIC METABOLIC PANEL
BUN/Creatinine Ratio: 22 (ref 12–28)
BUN: 38 mg/dL — ABNORMAL HIGH (ref 8–27)
CO2: 25 mmol/L (ref 20–29)
Calcium: 8.7 mg/dL (ref 8.7–10.3)
Chloride: 102 mmol/L (ref 96–106)
Creatinine, Ser: 1.7 mg/dL — ABNORMAL HIGH (ref 0.57–1.00)
GFR calc Af Amer: 33 mL/min/{1.73_m2} — ABNORMAL LOW (ref >59)
GFR calc non Af Amer: 29 mL/min/{1.73_m2} — ABNORMAL LOW (ref >59)
Glucose: 110 mg/dL — ABNORMAL HIGH (ref 65–99)
Potassium: 4.5 mmol/L (ref 3.5–5.2)
Sodium: 139 mmol/L (ref 134–144)

## 2020-03-11 LAB — POCT INR: INR: 3 — AB (ref <=1.1)

## 2020-03-12 ENCOUNTER — Telehealth (INDEPENDENT_AMBULATORY_CARE_PROVIDER_SITE_OTHER): Payer: Self-pay

## 2020-03-12 ENCOUNTER — Encounter (INDEPENDENT_AMBULATORY_CARE_PROVIDER_SITE_OTHER): Payer: Self-pay

## 2020-03-12 NOTE — Telephone Encounter (Signed)
Called patient and let her know to remain on her current coumadin dose per Dr. Anastasio Champion. Patient verbalized an understanding.

## 2020-03-13 ENCOUNTER — Other Ambulatory Visit: Payer: Self-pay

## 2020-03-13 DIAGNOSIS — I10 Essential (primary) hypertension: Secondary | ICD-10-CM

## 2020-03-20 ENCOUNTER — Ambulatory Visit (INDEPENDENT_AMBULATORY_CARE_PROVIDER_SITE_OTHER): Payer: Self-pay | Admitting: Nurse Practitioner

## 2020-03-20 ENCOUNTER — Telehealth (INDEPENDENT_AMBULATORY_CARE_PROVIDER_SITE_OTHER): Payer: Self-pay | Admitting: Nurse Practitioner

## 2020-03-20 ENCOUNTER — Other Ambulatory Visit (INDEPENDENT_AMBULATORY_CARE_PROVIDER_SITE_OTHER): Payer: Self-pay | Admitting: Internal Medicine

## 2020-03-20 DIAGNOSIS — I10 Essential (primary) hypertension: Secondary | ICD-10-CM | POA: Diagnosis not present

## 2020-03-20 LAB — POCT INR: INR: 3.4 — AB (ref 2–3)

## 2020-03-20 NOTE — Telephone Encounter (Signed)
Called patient and LMOVM to return call  Called and left a detailed voice message on both numbers provided for patient to return call to discuss medication dose change for this week.

## 2020-03-20 NOTE — Telephone Encounter (Signed)
Please call patient let her know that I did get her INR which was above goal, my recommendation would be that she hold one dose of her Coumadin this week, then continue taking it as prescribed.  She should also check her INR again next week.  Because her results came in on the fax machine when we were closed due to inclement weather if she reports already having held one dose this week, then she does not need to hold an additional dose.  If you have any questions or if the patient has any questions please let me know.

## 2020-03-20 NOTE — Telephone Encounter (Signed)
Patient called back and I gave her the message. Patient will withhold one coumadin dose this week and will have INR checked next week. Patient verbalized an understanding.

## 2020-03-21 LAB — BASIC METABOLIC PANEL
BUN/Creatinine Ratio: 21 (ref 12–28)
BUN: 31 mg/dL — ABNORMAL HIGH (ref 8–27)
CO2: 26 mmol/L (ref 20–29)
Calcium: 8.9 mg/dL (ref 8.7–10.3)
Chloride: 101 mmol/L (ref 96–106)
Creatinine, Ser: 1.49 mg/dL — ABNORMAL HIGH (ref 0.57–1.00)
GFR calc Af Amer: 39 mL/min/{1.73_m2} — ABNORMAL LOW (ref >59)
GFR calc non Af Amer: 34 mL/min/{1.73_m2} — ABNORMAL LOW (ref >59)
Glucose: 96 mg/dL (ref 65–99)
Potassium: 5 mmol/L (ref 3.5–5.2)
Sodium: 137 mmol/L (ref 134–144)

## 2020-03-26 DIAGNOSIS — Z7901 Long term (current) use of anticoagulants: Secondary | ICD-10-CM | POA: Diagnosis not present

## 2020-03-26 LAB — POCT INR: INR: 3 (ref 2.0–3.0)

## 2020-03-27 ENCOUNTER — Telehealth (INDEPENDENT_AMBULATORY_CARE_PROVIDER_SITE_OTHER): Payer: Self-pay

## 2020-03-27 NOTE — Telephone Encounter (Signed)
Called patient and let her know to continue her same coumadin dose. Patient verbalized an understanding.

## 2020-03-28 ENCOUNTER — Ambulatory Visit: Payer: Medicare Other

## 2020-04-04 ENCOUNTER — Ambulatory Visit (INDEPENDENT_AMBULATORY_CARE_PROVIDER_SITE_OTHER): Payer: Medicare Other | Admitting: Pharmacist Clinician (PhC)/ Clinical Pharmacy Specialist

## 2020-04-04 ENCOUNTER — Other Ambulatory Visit: Payer: Self-pay

## 2020-04-04 DIAGNOSIS — I2510 Atherosclerotic heart disease of native coronary artery without angina pectoris: Secondary | ICD-10-CM

## 2020-04-04 DIAGNOSIS — I10 Essential (primary) hypertension: Secondary | ICD-10-CM | POA: Diagnosis not present

## 2020-04-04 MED ORDER — NITROGLYCERIN 0.4 MG SL SUBL: 0.4000 mg | SUBLINGUAL_TABLET | SUBLINGUAL | 12 refills | Status: DC | PRN

## 2020-04-04 MED ORDER — HYDRALAZINE HCL 25 MG PO TABS: 25.0000 mg | ORAL_TABLET | Freq: Two times a day (BID) | ORAL | 6 refills | Status: DC

## 2020-04-04 NOTE — Progress Notes (Signed)
04/05/2020 Katelyn [name] 28-Aug-1940 [id number]   HPI:  Katelyn [name] is a 80 y.o. female patient of Dr Claiborne Billings, with a PMH below who presents today for hypertension clinic evaluation.  She has followed with Dr. Claiborne Billings for years, but didn't have BP increase significantly until this year.  At her last visit with him it was noted to be 198/82, with a pulse of 47.  At that time he increased her irbesartan to 300 mg and added chlorthalidone 12.5 mg.  Labs drawn 2-3 weeks later showed a significant increase in SCr from 1.03 to 1.37.  He asked that she be sure to increase hydration.  He also ordered a renal doppler to check for any renal artery stenosis.  Patient returns to office today for follow up.  She had renal dopplers done this morning, and has had nothing to eat or drink since midnight.  She feels well overall, no problems with the medication changes at her last visit.  She had been monitoring her pressure and HR at home, and called into the office in early December when her heart rate dropped to the high 30's.  Her metoprolol succinate was cut from 100 to 50 mg daily.  She has not been able to continue with home monitoring much lately, as she was using her daughter's cuff, and the daughter needed it back to monitor her own elevated pressures.  In reviewing her medication list, it is noted that she take meloxicam.  Patient states she uses this several days per week, especially in damp/rainy weather because of arthritis pain.  She is also still taking a potassium supplement, although she stopped the furosemide (taking q3d) at her last visit.    At last visit cut metoprolol succ to 25 mg qd due to bradycardia.  Labs drawn that day showed some improvement in SCr, down to 1.49 with GFR improved to 34.  Were not able to increase dose of chlorthalidone based on taht information.    Switch back to amlodipine or add hydralazine - depend on sx af and HR Last visit BP 172/68 (after renal dopplers) Swelling  started after d/c chlorthalidone    Past Medical History: ASCVD Infrarenal abdominal aorta occlusion, Aortobifemoral bypass; chronically occluded RCA w/ left to right collaterals; chart indicates prior stroke x 2 in 1982  Aortic stenosis TAVR 2016  AF Developed post TAVR, reappeared in 2018, [place] score 7  Hyperlipidemia 10/21: TC 133, TG 105, HDL 57, LDL 57 - on rosuvastatin 20     Blood Pressure Goal:  130/80  Current Medications: chlorthalidone 12.5 mg qd, diltiazem CD 120 mg qd, irbesartan 300 mg qd, metoprolol succ 100 mg qd (takes all at night)  Family Hx: father with heart disease, died around 74; mother had epilepsy died at 17. 2 brothers deceased, 1 with multiple stroke both died in 70's; 1 daughter with AF, DM  Social Hx: no tobacco, quit in 2012; no alcohol, 2 cups of coffee a day at the most  Diet: mostly home cooked, adds some salt with cooking; likes salads, cooked greens; mostly canned vegetables - rinses to gete salt down or gets low sodium/no salt  Exercise: walks dog, (small dog, so not terribly fast)  Home BP readings:    26 morning readings average 140/68  HR range 41-60  28 evening readings average 145/71  HR range 41-73  Intolerances: no cardiac medications, is allergic to latex  Labs: 12/21:  Na 142, K 4.9, Glu 123, BUN 33,  SCr 1.37  Wt Readings from Last 3 Encounters:  04/04/20 187 lb 3.2 oz (84.9 kg)  03/06/20 184 lb (83.5 kg)  01/22/20 185 lb 12.8 oz (84.3 kg)   BP Readings from Last 3 Encounters:  04/04/20 (!) 142/68  03/06/20 (!) 172/68  01/22/20 (!) 198/82   Pulse Readings from Last 3 Encounters:  04/04/20 (!) 54  03/06/20 (!) 52  01/22/20 (!) 47    Current Outpatient Medications  Medication Sig Dispense Refill  . amiodarone (PACERONE) 200 MG tablet Take 1 tablet by mouth once daily 90 tablet 3  . Cholecalciferol (VITAMIN D3) 50 MCG (2000 UT) capsule Take 1 capsule by mouth daily.     Marland Kitchen diltiazem (CARDIZEM CD) 120 MG 24 hr capsule  Take 1 capsule (120 mg total) by mouth daily. 90 capsule 3  . estradiol (ESTRACE) 2 MG tablet Take 1 tablet by mouth once daily 90 tablet 0  . hydrALAZINE (APRESOLINE) 25 MG tablet Take 1 tablet (25 mg total) by mouth in the morning and at bedtime. 60 tablet 6  . ipratropium (ATROVENT) 0.06 % nasal spray USE 1 SPRAY(S) IN EACH NOSTRIL EVERY 6 HOURS AS NEEDED FOR RHINITIS 15 mL 2  . irbesartan (AVAPRO) 300 MG tablet Take 1 tablet (300 mg total) by mouth daily. 90 tablet 3  . metoprolol succinate (TOPROL XL) 25 MG 24 hr tablet Take 1 tablet (25 mg total) by mouth daily. 90 tablet 3  . nitroGLYCERIN (NITROSTAT) 0.4 MG SL tablet Place 1 tablet (0.4 mg total) under the tongue every 5 (five) minutes as needed for chest pain. 25 tablet 12  . NP THYROID 90 MG tablet Take 1 tablet by mouth once daily 30 tablet 3  . pantoprazole (PROTONIX) 40 MG tablet TAKE 1 TABLET BY MOUTH ONCE DAILY BEFORE SUPPER 90 tablet 3  . rosuvastatin (CRESTOR) 20 MG tablet TAKE 1 TABLET BY MOUTH AT BEDTIME 90 tablet 0  . warfarin (COUMADIN) 2.5 MG tablet Take 1 tablet by mouth once daily 90 tablet 0  . chlorthalidone (HYGROTON) 25 MG tablet Take 0.5 tablets (12.5 mg total) by mouth daily. (Patient not taking: Reported on 04/04/2020) 45 tablet 3  . meloxicam (MOBIC) 15 MG tablet Take 1 tablet by mouth once daily (Patient taking differently: Take 15 mg by mouth as needed.) 30 tablet 0   No current facility-administered medications for this visit.    Allergies  Allergen Reactions  . Adhesive [Tape] Rash  . Latex Rash  . Tetanus Toxoids Rash    Past Medical History:  Diagnosis Date  . Allergy    rhinitis  . Aortic stenosis, severe   . Aortic stenosis, severe   . Arthritis    spine and various joints  . Cataract   . Cerebrovascular disease 12/05/2018  . Childhood asthma   . Chronic bronchitis ([place])       . Claudication ([place])   . Coronary artery disease    a. April 2012 which revealed mid RCA occlusion with collaterals,  50% LAD stenosis, 30% circumflex stenosis, and 40% marginal stenosis  . GERD (gastroesophageal reflux disease)   . Heart murmur   . Hyperlipidemia   . Hypertension   . LUMBAR RADICULOPATHY, RIGHT 12/21/2007   Qualifier: Diagnosis of  By: Aline Brochure MD, Dorothyann Peng    . LUMBAR RADICULOPATHY, RIGHT   . Peripheral vascular disease ([place])   . 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 Seroma, postoperative    Left Groin  . Severe aortic valve stenosis 08/31/2014  . Stroke [facility]) 1982 X 2   "a little bit weaker on the left side since" (08/29/2014)    Blood pressure (!) 142/68, pulse (!) 54, height 5\' 10"  (1.778 m), weight 187 lb 3.2 oz (84.9 kg).  Essential hypertension Patient with essential hypertension, still not at goal.  Discussed option of switching from diltiazem back to amlodipine, however she believes that the amlodipine caused her to have lower extremity edema.  Instead will have her continue with her current medications and add hydralazine 25 mg twice daily.  She should continue with regular home BP monitoring and has an appointment with Dr. Claiborne Billings at the end of the month.    Tommy Medal PharmD CPP [facility] [address] [place], [place] [address] [phone]

## 2020-04-04 NOTE — Patient Instructions (Signed)
Return for a a follow up appointment on Feb 28 with Dr. Claiborne Billings  Check your blood pressure at home daily and keep record of the readings.  Take your BP meds as follows:  Start hydralazine 25 mg twice daily.    Continue all other medications  Bring all of your meds, your BP cuff and your record of home blood pressures to your next appointment.  Exercise as you're able, try to walk approximately 30 minutes per day.  Keep salt intake to a minimum, especially watch canned and prepared boxed foods.  Eat more fresh fruits and vegetables and fewer canned items.  Avoid eating in fast food restaurants.    HOW TO TAKE YOUR BLOOD PRESSURE: . Rest 5 minutes before taking your blood pressure. .  Don't smoke or drink caffeinated beverages for at least 30 minutes before. . Take your blood pressure before (not after) you eat. . Sit comfortably with your back supported and both feet on the floor (don't cross your legs). . Elevate your arm to heart level on a table or a desk. . Use the proper sized cuff. It should fit smoothly and snugly around your bare upper arm. There should be enough room to slip a fingertip under the cuff. The bottom edge of the cuff should be 1 inch above the crease of the elbow. . Ideally, take 3 measurements at one sitting and record the average.

## 2020-04-05 ENCOUNTER — Encounter: Payer: Self-pay | Admitting: Pharmacist Clinician (PhC)/ Clinical Pharmacy Specialist

## 2020-04-05 ENCOUNTER — Telehealth: Payer: Self-pay | Admitting: Cardiovascular Disease

## 2020-04-05 NOTE — Telephone Encounter (Signed)
Spoke with pt, she has taken 2 doses of the hydralazine and feels like she is now out of rhythm. She reports her heart started beating fast and it is has been running in the 90's all day. Currently it is 120/67 with pulse of 96 and she can feel her heart beating in her chest. She denies other symptoms. She takes her metoprolol in the evening and instructed patient to take an extra metoprolol now to see if that will help with the heart rate. Patient voiced understanding and will call back if she continues to have problems.

## 2020-04-05 NOTE — Telephone Encounter (Signed)
Patient c/o Palpitations:  High priority if patient c/o lightheadedness, shortness of breath, or chest pain  1) How long have you had palpitations/irregular HR/ Afib? Are you having the symptoms now? This morning, not having symptoms now  2) Are you currently experiencing lightheadedness, SOB or CP? Had lightheadedness earlier, not now  3) Do you have a history of afib (atrial fibrillation) or irregular heart rhythm? yes  4) Have you checked your BP or HR? (document readings if available): HR 97, 90, 88, 97, 93, 86, 91, 92  5) Are you experiencing any other symptoms? no  Patient states she has been having a low HR but this morning she thinks she went into afib. She states she is not having symptoms now. She states she was also started on a new medication, hydralazine. She states she took one tablet this morning and another one this morning. She states she hasn't had afib lately.

## 2020-04-05 NOTE — Assessment & Plan Note (Addendum)
Patient with essential hypertension, still not at goal.  Discussed option of switching from diltiazem back to amlodipine, however she believes that the amlodipine caused her to have lower extremity edema.  Instead will have her continue with her current medications and add hydralazine 25 mg twice daily.  She should continue with regular home BP monitoring and has an appointment with Dr. Claiborne Billings at the end of the month.

## 2020-04-08 ENCOUNTER — Other Ambulatory Visit (INDEPENDENT_AMBULATORY_CARE_PROVIDER_SITE_OTHER): Payer: Self-pay | Admitting: Internal Medicine

## 2020-04-08 DIAGNOSIS — Z79890 Hormone replacement therapy (postmenopausal): Secondary | ICD-10-CM

## 2020-04-11 LAB — PROTIME-INR: INR: 3 — AB (ref 0.9–1.1)

## 2020-04-15 ENCOUNTER — Encounter: Payer: Self-pay | Admitting: Nurse Practitioner

## 2020-04-15 ENCOUNTER — Other Ambulatory Visit: Payer: Self-pay | Admitting: *Deleted

## 2020-04-15 DIAGNOSIS — I872 Venous insufficiency (chronic) (peripheral): Secondary | ICD-10-CM

## 2020-04-15 DIAGNOSIS — I739 Peripheral vascular disease, unspecified: Secondary | ICD-10-CM

## 2020-04-15 LAB — POCT INR: INR: 2.3 (ref 2.0–3.0)

## 2020-04-22 DIAGNOSIS — Z7901 Long term (current) use of anticoagulants: Secondary | ICD-10-CM | POA: Diagnosis not present

## 2020-04-22 LAB — POCT INR: INR: 2.8 (ref 2.0–3.0)

## 2020-04-23 ENCOUNTER — Telehealth (INDEPENDENT_AMBULATORY_CARE_PROVIDER_SITE_OTHER): Payer: Self-pay

## 2020-04-23 NOTE — Telephone Encounter (Signed)
RECEIVED FAX FROM [facility] THAT KatelynAllen INR  VALUE WAS 2.8.  CALLED MRS. Renegar AND GIVEN VERBAL INSTRUCTIONS TO CONTINUE MEDICATIONS. NO CHANGES AT THIS TIME. WE WILL LOOK FOR NEXT WEEK RESULTS VIA FAX.

## 2020-04-29 ENCOUNTER — Encounter: Payer: Self-pay | Admitting: Cardiovascular Disease

## 2020-04-29 ENCOUNTER — Other Ambulatory Visit: Payer: Self-pay

## 2020-04-29 ENCOUNTER — Ambulatory Visit (INDEPENDENT_AMBULATORY_CARE_PROVIDER_SITE_OTHER): Payer: Medicare Other | Admitting: Cardiovascular Disease

## 2020-04-29 VITALS — BP 152/74 | HR 55 | Ht 70.0 in | Wt 182.6 lb

## 2020-04-29 DIAGNOSIS — I10 Essential (primary) hypertension: Secondary | ICD-10-CM

## 2020-04-29 DIAGNOSIS — E785 Hyperlipidemia, unspecified: Secondary | ICD-10-CM

## 2020-04-29 DIAGNOSIS — I701 Atherosclerosis of renal artery: Secondary | ICD-10-CM | POA: Diagnosis not present

## 2020-04-29 DIAGNOSIS — N1832 Chronic kidney disease, stage 3b: Secondary | ICD-10-CM

## 2020-04-29 DIAGNOSIS — I480 Paroxysmal atrial fibrillation: Secondary | ICD-10-CM

## 2020-04-29 DIAGNOSIS — R001 Bradycardia, unspecified: Secondary | ICD-10-CM

## 2020-04-29 DIAGNOSIS — I447 Left bundle-branch block, unspecified: Secondary | ICD-10-CM

## 2020-04-29 DIAGNOSIS — Z952 Presence of prosthetic heart valve: Secondary | ICD-10-CM | POA: Diagnosis not present

## 2020-04-29 LAB — POCT INR: INR: 2 (ref 2.0–3.0)

## 2020-04-29 NOTE — Progress Notes (Signed)
Patient ID: EVANY SCHECTER, female   DOB: [birth year], 80 y.o.   MRN: [id number]    PCP: Dr. Newt Minion  HPI: TERRILYN TYNER is a 80 y.o. female who presents to the office for a 3 month follow-up cardiology evaluation  Mrs. Novakovich has known CAD and underwent initial cardiac catheterization in  April 2012 which revealed mid RCA occlusion with collaterals, 50% LAD stenosis, 30% circumflex stenosis, and 40% marginal stenosis. 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 Trula Slade. She has a history of hypertension with grade 2 diastolic dysfunction, hyperlipidemia, and has developed moderately severe aortic valve stenosis. An echo Doppler study in May 2013 showed a mean transvalvular aortic gradient of 34 with a maximum of 56. In December 2013, her mean gradient was 32 and peak 56 with a valve area of 0.84. An echo Doppler study on 12/06/2012 continued to show normal systolic function with an ejection fraction of 60-65%. She had grade 1 diastolic dysfunction. Peak gradients were  61 mm and mean gradient was 38 mm.  The aortic valve area 0.8-0.9 cm. She had mild to moderate aortic insufficiency. Had mildly thickened mitral valve leaflets with mild MR. The left atrium was mildly dilated. She had mild TR. Pulmonary pressure was mildly increased at 33 mm.  A follow-up echo Doppler study on 02/12/2014  Showed normal EF at 55-60%. Her aortic valve was severely calcified and stenosis was felt to be severe with a mean gradient of 42, a peak gradient of 64 which had slightly increaed from 38 and 61 mm, respectively.  She began to develop exertional chest discomfort with associated dyspnea and fatigue.  She underwent repeat cardiac catheterization on 07/25/2014 which showed a chronically occluded RCA with left-to-right collaterals, a 70% diagonal stenosis, 50% ostial circumflex  stenosis and 40% AV groove circumflex stenosis.  Her aortic stenosis at further progressed such that she had a mean gradient of 42 mm.  She was felt to be a poor candidate for conventional aortic valve replacement and on 08/31/2014 underwent successful  TAVR with placement of a 26 mm Edward Sapien 3 valve from the right femoral approach by Drs. Roxy Manns and [place].  She developed postoperative atrial fibrillation for which she was transiently treated with amiodarone and when seen for office follow-up she was back in sinus rhythm.  She has noticed improvement in energy.  She is unaware of recurrent atrial fibrillation.  She denies recurrent chest tightness.  She at times is fatigued.  A subsequent echo on 10/01/2014 showed an EF of 60-65% with a mean aortic gradient of 16 mm and peak gradient 26 with trivial aortic insufficiency.    A follow-up echo Doppler study on 09/09/2015 showed an EF of 60-65% and grade 2 diastolic dysfunction.  There were no wall motion abnormalities.  Her 26 mm Edward's Sapien 3 valve was well-seated from her TAVR procedure.  The mean valve gradient was 16 mm.  There was no evidence for perivalvular leak.  There was mitral annular calcification with mild MR, mild LA dilatation and mild dilation of her right ventricle.  There is mild coronary hypertension with a PA pressure 33 mm.    When I  saw her in October 2017, her dose of valsartan had been reduced prior to the office visit by Dr. Karie Kirks.  At that time, she was having more frequent palpitations and had trigeminy on her ECG.  With her being on reduce valsartan.  I recommended further titration of her beta blocker and increased her Toprol to 50 mg daily.    When I saw her in December 2017 her blood pressure was significantly elevated.  Her resting pulse was 57.  She was no longer taking valsartan and I added amlodipine 5 mg for more optimal blood pressure control.  She  underwent lower extremity Doppler studies on 03/16/2016 by  [facility] which revealed normal ABIs bilaterally. When I saw her in f/u on 03/31/16 BP was improved but was in atrial fibrillation which was new.  She has continued to be on warfarin anticoagulation and her INR was therapeutic.  I further titrated Toprol-XL to 75 mg for 5 days and recommended that she increase it to 100 mg depending upon her heart rate response.  She did take 100 mg, she did experience some dizziness and as result, self reduced her back to 75 mg daily.  She is unaware of her rhythm being irregular.  In March 2018 her blood pressure was stable.  On 09/09/2016, she underwent a one-year follow-up echo Doppler study.  This showed mild LVH with normal systolic function.  There was grade 1 diastolic dysfunction.  The TAVR bioprosthesis was functioning well.  She had mild LA dilation and mild pulmonary hypertension at 40 mm.  She recently underwent lab work in May by Dr. Karie Kirks.  After she left the office.  I was able to obtain these results.  This showed a cholesterol of 128 and LDL of 54, HDL 49, and triglycerides of 168.  Her creatinine was 1.0.  Hemoglobin 15.7, hematocrit 45.8.  She had recently been notified by her pharmacy that the valsartan that she was taken was recently recalled.  When I last saw her, she had stopped taking the valsartan.  I changed her to irbesartan 150 mg.  She was seen by Dr. Meda Coffee, for primary care in [place] in early February 2019. Her INR was therapeutic at 2.6.  She was on amlodipine 5 mg, furosemide as needed, irbesartan 150 mg, and Toprol 25 mg in addition to rosuvastatin 20 mg and warfarin.  Lab from 04/24/17  showed a creatinine of 1.01.  TSH was pending.  Hemoglobin 15, hematocrit 43, but platelets were decreased at 1 25,000.  Total cholesterol is 124, HDL 46, triglycerides 214, and LDL 50.  Vitamin D was very low at 7.  When I saw her in February 2019, her ECG revealed that she was in atrial fibrillation for which she was asymptomatic.  At that time, I  titrated metoprolol succinate to 100 mg daily.  I also started her on 50,000 units of vitamin D weekly for at least 3 months.  She underwent a follow-up echo Doppler study on for 05/21/2017 which showed an EF of 60-65% with grade 2 diastolic dysfunction.  HerTAVR bioprosthesis was well-seated and functioned well.  There was no aortic insufficiency. Her left atrium was severely dilated.  Estimated PA pressure was 34 mm.   I saw her in March 2019 at which time she was maintaining sinus rhythm and ECG showed sinus bradycardia at 56 bpm with left bundle branch block.  She underwent a carotid study in May 2019 which showed mild carotid disease with 40 to 50% stenosis in the right internal carotid and 1 at 39% in the left.  She had normal subclavian and vertebral flow.  She over the past year, she was seen in September 2019 by Fabian Sharp, PA and was noting  some intermittent leg swelling for which she was taking Lasix as needed approximately 2-3 times per week and also using support stockings.    I saw her on May 09, 2018 presently, at which time she had noticed a rare episode of heart rate irregularity.  She denied any chest pain or shortness of breath.  She continued to be on amlodipine 5 mg,irbesartan150 mg,furosemide 20 mg for which he takes it 2-3 times per week, Toprol-XL 100 mg for hypertension and heart rate. She was on warfarin anticoagulation. GERD has been controlled with pantoprazole.  Her ECG in the office confirmed that she was back in atrial fibrillation.  Her ventricular rate was 87.  During that evaluation I discontinued amlodipine and substituted this with Cardizem CD 180 mg.  Laboratory was obtained.  She underwent a one-year follow-up echo Doppler study  on May 19, 2018.  At the time of her echo evaluation she was still in atrial fibrillation.  However, LV function remained normal.  Her TAVR valve was stable and actually gradients appeared slightly less than previously.  She was  evaluated by me in April 2020 and a telemedicine visit.  She believes her heart rhythm has stabilized.  Obviously she has not had a follow-up EKG.  Her pulse was running fairly regularly in the 60s.  She does note almost daily ankle swelling and she has increased her furosemide to 20 mg daily.  She continued to use support stockings.  She denied chest pain,recurrent palpitations, presyncope or syncope.    I saw her in August 2020  She has had purposeful weight loss of approximately 20 pounds.  She continued to be on diltiazem 180 mg, irbesartan 150 mg, Toprol-XL 100 mg for blood pressure and rate control.  She was taking furosemide as needed 2 times per week 20 mg.  She was on rosuvastatin for hyperlipidemia.  She continued to be on warfarin for anticoagulation.  During that evaluation, her ECG demonstrated that she was in atrial fibrillation with a ventricular rate at 67 bpm and had left bundle branch block with repolarization changes.  She was unaware of her atrial fibrillation.  During that evaluation I initiated amiodarone 200 mg daily.  Subsequent laboratory was obtained.  Since I saw her she has been divided by her primary physician Dr. Anastasio Champion.  I last saw her in November 2020.  At that time she was unaware of any abnormal rhythm.  She denied chest pain PND orthopnea.  She denied any dizziness.  She had noted some trivial ankle swelling.  Her INR was therapeutic at 2.6.  Her EKG at that time revealed that she had converted to sinus rhythm.  Due to mild blood pressure elevation and ankle edema I suggested that she increase her Lasix from 2 times per week and take this every other day.  She underwent a follow-up echo Doppler studies on June 22, 2019.  This revealed an EF of 60 to 65% with moderate LVH.  There was moderately elevated pulmonary artery systolic pressure with estimated right ventricular systolic pressure at 58 mmHg. Her TAVR 26 mm sapient 3 valve was stable with stable gradients with a mean  gradient of 5.3 and peak gradient of 11.6.  In June 2021 she underwent carotid duplex imaging which showed right ICA velocities consistent with a 40 to 59% stenosis in the left ICA consistent with a 1 to 39% stenosis.  I last saw her in November 2021.  At that time she denied any chest pain.  Her blood pressure at home typically was running around 145/64.  At times she develops  episodes of apparent recurrent atrial fibrillation and believes she had an episode 2 days previously that lasted for 30 minutes.  She has only been taking furosemide every third day.  She continues to be on amiodarone 200 mg daily, diltiazem 180 mg, irbesartan 150 mg and metoprolol succinate 100 mg daily.  She is on rosuvastatin for hyperlipidemia and takes 90 mg of thyroid pill.  She continues to be on warfarin for anticoagulation.  During that evaluation, she was significantly hypertensive with blood pressure initially at 198/82 and repeat 188/80.  She was bradycardic with heart rate at 47.  With her bradycardia I decrease diltiazem to 120 mg and recommended she undergo renal duplex imaging in light of her peripheral vascular disease to make certain she did not have a renal vascular etiology such as renal artery stenosis contributing to her accelerated hypertension.  During that evaluation I recommended further titration of irbesartan to 300 mg and recommended discontinuance of furosemide since she had been taking this only every third day and started her on chlorthalidone 12.5 mg daily.   She has subsequently been seen by Joslyn Hy, Pharm.D. metoprolol dose has been reduced to 50 mg and later down to 25 mg due to bradycardia.  Laboratory on March 08, 2020 showed increased creatinine at 1.7.  She also has been taking meloxicam several days per week which was discontinued and she was advised hydration.  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   At her last visit with Joslyn Hy, her creatinine had improved to 1.49 she was started on hydralazine 25 mg twice a day.  Presently, she feels well.  She is now on a regimen consisting of amiodarone 20 mg daily, chlorthalidone 12.5 mg, diltiazem 120 mg, irbesartan 300 mg, metoprolol succinate 25 mg.  She denies any swelling.  She states she takes meloxicam 1-2 times per week depending upon her arthritic symptoms.  Over the past month she is unaware of any recurrent atrial fibrillation.  She continues to be on rosuvastatin 20 mg and LDL cholesterol in October 2021 was 57.  She presents for follow-up evaluation.  Past Medical History:  Diagnosis Date  . Allergy    rhinitis  . Aortic stenosis, severe   . Aortic stenosis, severe   . Arthritis    spine and various joints  . Cataract   . Cerebrovascular disease 12/05/2018  . Childhood asthma   . Chronic bronchitis ([place])       . Claudication ([place])   . Coronary artery disease    a. April 2012 which revealed mid RCA occlusion with collaterals, 50% LAD stenosis, 30% circumflex stenosis, and 40% marginal stenosis  . GERD (gastroesophageal reflux disease)   . Heart murmur   . Hyperlipidemia   . Hypertension   . LUMBAR RADICULOPATHY, RIGHT 12/21/2007   Qualifier: Diagnosis of  By: Aline Brochure MD, Dorothyann Peng    . LUMBAR RADICULOPATHY, RIGHT   . Peripheral vascular disease ([place])   . 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 Seroma, postoperative    Left Groin  . Severe aortic valve stenosis 08/31/2014  . Stroke [facility]) 1982 X 2   "a little bit weaker on the left side since" (08/29/2014)    Past Surgical  History:  Procedure Laterality Date  . AORTO-FEMORAL BYPASS GRAFT Bilateral 06/27/10  . BASAL CELL CARCINOMA EXCISION Left    face  . CARDIAC CATHETERIZATION N/A 07/25/2014   Procedure: Right/Left Heart Cath and Coronary Angiography;   Surgeon: Troy Sine, MD;  Location: [facility];  Service: Cardiovascular;  Laterality: N/A;  . CATARACT EXTRACTION, BILATERAL Bilateral 09/29/2016  . COLONOSCOPY  2008   Dr. Laural Golden: normal.   . DILATION AND CURETTAGE OF UTERUS  "2 or 3"  . ESOPHAGOGASTRODUODENOSCOPY N/A 08/18/2017   Dr. Gala Romney: Normal-appearing esophagus status post dilation.  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  Small hiatal hernia.  Marland Kitchen MALONEY DILATION N/A 08/18/2017   Procedure: Venia Minks DILATION;  Surgeon: Daneil Dolin, MD;  Location[facility];  Service: Endoscopy;  Laterality: N/A;  . MULTIPLE TOOTH EXTRACTIONS    . PR VEIN BYPASS GRAFT,AORTO-FEM-POP  06/12/10  . TEE WITHOUT CARDIOVERSION N/A 08/31/2014   Procedure: TRANSESOPHAGEAL ECHOCARDIOGRAM (TEE);  Surgeon: Rexene Alberts, MD;  Location: [facility];  Service: Open Heart Surgery;  Laterality: N/A;  . TRANSCATHETER AORTIC VALVE REPLACEMENT, TRANSFEMORAL N/A 08/31/2014   Procedure: TRANSCATHETER AORTIC VALVE REPLACEMENT, TRANSFEMORAL approach;  Surgeon: Rexene Alberts, MD;  Location: [facility];  Service: Open Heart Surgery;  Laterality: N/A;  . TUBAL LIGATION  1970's  . VAGINAL HYSTERECTOMY  1970's   Partial     Allergies  Allergen Reactions  . Adhesive [Tape] Rash  . Latex Rash  . Tetanus Toxoids Rash    Current Outpatient Medications  Medication Sig Dispense Refill  . amiodarone (PACERONE) 200 MG tablet Take 1 tablet by mouth once daily 90 tablet 3  . chlorthalidone (HYGROTON) 25 MG tablet Take 0.5 tablets (12.5 mg total) by mouth daily. 45 tablet 3  . Cholecalciferol (VITAMIN D3) 50 MCG (2000 UT) capsule Take 1 capsule by mouth daily.     Marland Kitchen diltiazem (CARDIZEM CD) 120 MG 24 hr capsule Take 1 capsule (120 mg total) by mouth daily. 90 capsule 3  . estradiol (ESTRACE) 2 MG tablet Take 1 tablet by mouth once daily 90 tablet 0  . hydrALAZINE (APRESOLINE) 25 MG tablet Take 1 tablet (25 mg total) by mouth in the morning and at bedtime. 60 tablet 6  .  ipratropium (ATROVENT) 0.06 % nasal spray USE 1 SPRAY(S) IN EACH NOSTRIL EVERY 6 HOURS AS NEEDED FOR RHINITIS 15 mL 2  . irbesartan (AVAPRO) 300 MG tablet Take 1 tablet (300 mg total) by mouth daily. 90 tablet 3  . meloxicam (MOBIC) 15 MG tablet Take 1 tablet by mouth once daily 30 tablet 0  . metoprolol succinate (TOPROL XL) 25 MG 24 hr tablet Take 1 tablet (25 mg total) by mouth daily. 90 tablet 3  . nitroGLYCERIN (NITROSTAT) 0.4 MG SL tablet Place 1 tablet (0.4 mg total) under the tongue every 5 (five) minutes as needed for chest pain. 25 tablet 12  . NP THYROID 90 MG tablet Take 1 tablet by mouth once daily 30 tablet 3  . pantoprazole (PROTONIX) 40 MG tablet TAKE 1 TABLET BY MOUTH ONCE DAILY BEFORE SUPPER 90 tablet 3  . rosuvastatin (CRESTOR) 20 MG tablet TAKE 1 TABLET BY MOUTH AT BEDTIME 90 tablet 0  . warfarin (COUMADIN) 2.5 MG tablet Take 1 tablet by mouth once daily 90 tablet 0   No current facility-administered medications for this visit.    Social History   Socioeconomic History  . Marital status: Married    Spouse name: bobby  .  Number of children: 1  . Years of education: Not on file  . Highest education level: High school graduate  Occupational History  . 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 Smoking status: Former Smoker    Packs/day: 2.00    Years: 56.00    Pack years: 112.00    Types: Cigarettes    Start date: 03/02/1952    Quit date: 06/01/2010    Years since quitting: 9.9  . Smokeless tobacco: Never Used  . Tobacco comment: HAD WEANED DOWN TO 1/4 PK A DAY BEFORE D/C  Vaping Use  . Vaping Use: Never used  Substance and Sexual Activity  . Alcohol use: No    Alcohol/week: 0.0 standard drinks    Comment: 08/29/2014 "never drank much; quit in the 1990's"  . Drug use: No  . Sexual activity: Yes    Partners: Male  Other Topics Concern  . Not on file  Social History Narrative   [place] husband in November 2020. Retired,used to work at  [employer].Lives alone.         Patient has one daughter, 3 grands   Likes to crochet   Works in [employer].    Social Determinants of Health   Financial Resource Strain: Not on file  Food Insecurity: Not on file  Transportation Needs: Not on file  Physical Activity: Not on file  Stress: Not on file  Social Connections: Not on file  Intimate Partner Violence: Not on file    Family History  Problem Relation Age of Onset  . Heart disease Mother 47       died young  . Varicose Veins Mother   . Stroke Father   . Hypertension Father   . Heart disease Father 60       Aneurysm  . Heart attack Father   . Arthritis Brother   . Stroke Brother   . Diabetes Daughter   . Arthritis Daughter   . Allergic rhinitis Neg Hx   . Angioedema Neg Hx   . Asthma Neg Hx   . Atopy Neg Hx   . Eczema Neg Hx   . Immunodeficiency Neg Hx   . Urticaria Neg Hx    Social history is notable that she is married has one child. She quit tobacco in April 2012 after a long-standing tobacco history having started smoking at age 55.   ROS General: Negative; No fevers, chills, or night sweats;  HEENT: Negative; No changes in vision or hearing, sinus congestion, difficulty swallowing Pulmonary: Negative; No cough, wheezing, shortness of breath, hemoptysis Cardiovascular: See history of present illness.   GI: Negative; No nausea, vomiting, diarrhea, or abdominal pain GU: Negative; No dysuria, hematuria, or difficulty voiding Musculoskeletal: Negative; no myalgias, joint pain, or weakness Hematologic/Oncology: Negative; no easy bruising, bleeding Endocrine: Negative; no heat/cold intolerance; no diabetes Neuro: Negative; no changes in balance, headaches Skin: Negative; No rashes or skin lesions Psychiatric: Negative; No behavioral problems, depression Sleep: Negative; No snoring, daytime sleepiness, hypersomnolence, bruxism, restless legs, hypnogognic hallucinations, no cataplexy Other comprehensive 14  point system review is negative.   PE BP (!) 152/74 (BP Location: Left Arm, Patient Position: Sitting)   Pulse (!) 55   Ht '5\' 10"'  (1.778 m)   Wt 182 lb 9.6 oz (82.8 kg)   SpO2 94%   BMI 26.20 kg/m    Repeat blood pressure by me was 124/74  Wt Readings from Last 3 Encounters:  04/29/20 182 lb 9.6 oz (  82.8 kg)  04/04/20 187 lb 3.2 oz (84.9 kg)  03/06/20 184 lb (83.5 kg)   General: Alert, oriented, no distress.  Skin: normal turgor, no rashes, warm and dry HEENT: Normocephalic, atraumatic. Pupils equal round and reactive to light; sclera anicteric; extraocular muscles intact;  Nose without nasal septal hypertrophy Mouth/Parynx benign; Mallinpatti scale 3 Neck: No JVD, no carotid bruits; normal carotid upstroke Lungs: clear to ausculatation and percussion; no wheezing or rales Chest wall: without tenderness to palpitation Heart: PMI not displaced, RRR, s1 s2 normal, 1/6 systolic murmur, no diastolic murmur, no rubs, gallops, thrills, or heaves Abdomen: soft, nontender; no hepatosplenomehaly, BS+; abdominal aorta nontender and not dilated by palpation. Back: no CVA tenderness Pulses 2+ Musculoskeletal: full range of motion, normal strength, no joint deformities Extremities: no clubbing cyanosis or edema, Homan's sign negative  Neurologic: grossly nonfocal; Cranial nerves grossly wnl Psychologic: Normal mood and affect   ECG (independently read by me): Sinus bradycardia at 47; LBBB, 1st degree block, PR 222 msec   November 2021 ECG (independently read by me): Sinus Bradycardia at 47; 1st degree block; PR 228 msec; LBBB  November 2020 ECG (independently read by me): Sinus bradycardia 51 bpm, first-degree AV block, left bundle branch block with repolarization changes.  August 2020 ECG (independently read by me): Atrial Fibrillation at 67; LBBB with repolarization  March 2020 ECG (independently read by me): Atrial fibrillation with ventricular rate at 87.  Left bundle branch block  with repolarization changes.  March 2019 ECG (independently read by me): sinus bradycardia 56 bpm.  Left bundle branch block.  PR interval 198 ms.  04/14/2017 ECG (independently read by me): Atrial fibrillation at 77 bpm left bundle branch block.  Left axis deviation.  QTc interval 520 ms  August 2018 ECG (independently read by me): Sinus bradycardia with occasional PACs.  Left axis deviation.  Borderline first-degree AV block with a PR interval of 22 ms.  Left bundle-branch block  March 2018 ECG (independently read by me): Sinus bradycardia 52 bpm.  First-degree AV block with a PR interval of 210 ms.  Left bundle branch block with repolarization changes.  PE the C.  January 2018 ECG (independently read by me): Atrial fibrillation with a rate at 91.  Left bundle-branch block.  December 2017 ECG (independently read by me): Sinus bradycardia 57 bpm.  Left bundle branch block with repolarization changes.  October 2017 ECG (independently read by me): Sinus rhythm at 74 bpm with frequent PVCs in a trigeminal pattern.  March 2017 ECG (independently read by me): Sinus bradycardia 54 bpm.  Left bundle branch block with repolarization changes.  November 2016 ECG (independently read by me): Sinus bradycardia 55 bpm with occasional premature atrial complex.  Left bundle-branch block.  February 2016 ECG (independently read by me):  Sinus bradycardia 57 bpm.  Left bundle branch block with repolarization changes..  No ectopy.  December 2015 ECG (independently read by me): Sinus bradycardia 58 bpm.  Mild left atrial enlargement.  Left axis deviation.  Left bundle branch block with repolarization changes.  October 2014 ECG: Sinus bradycardia with left axis deviation. Left bundle branch block with repolarization changes  LAB BMP Latest Ref Rng & Units 03/20/2020 03/08/2020 02/14/2020  Glucose 65 - 99 mg/dL 96 110(H) 123(H)  BUN 8 - 27 mg/dL 31(H) 38(H) 33(H)  Creatinine 0.57 - 1.00 mg/dL 1.49(H) 1.70(H)  1.37(H)  BUN/Creat Ratio 12 - '28 21 22 24  ' Sodium 134 - 144 mmol/L 137 139 142  Potassium 3.5 -  5.2 mmol/L 5.0 4.5 4.9  Chloride 96 - 106 mmol/L 101 102 103  CO2 20 - 29 mmol/L '26 25 23  ' Calcium 8.7 - 10.3 mg/dL 8.9 8.7 9.5   Hepatic Function Latest Ref Rng & Units 12/14/2019 06/14/2019 03/14/2019  Total Protein 6.1 - 8.1 g/dL 6.2 6.3 6.0(L)  Albumin 3.5 - 5.0 g/dL - - -  AST 10 - 35 U/L '16 16 15  ' ALT 6 - 29 U/L '15 9 8  ' Alk Phosphatase 38 - 126 U/L - - -  Total Bilirubin 0.2 - 1.2 mg/dL 0.9 0.8 0.7   CBC Latest Ref Rng & Units 02/18/2019 05/17/2018 04/13/2017  WBC 4.0 - 10.5 K/uL 2.9(L) 7.5 5.7  Hemoglobin 12.0 - 15.0 g/dL 16.8(H) 15.4 15.0  Hematocrit 36.0 - 46.0 % 52.0(H) 47.0(H) 43.8  Platelets 150 - 400 K/uL 99(L) 163 125(L)   Lab Results  Component Value Date   MCV 93.0 02/18/2019   MCV 92 05/17/2018   MCV 89.9 04/13/2017   Lab Results  Component Value Date   TSH 1.98 12/14/2019   Lipid Panel     Component Value Date/Time   CHOL 133 12/14/2019 1317   CHOL 126 11/21/2018 1146   TRIG 105 12/14/2019 1317   HDL 57 12/14/2019 1317   HDL 51 11/21/2018 1146   CHOLHDL 2.3 12/14/2019 1317   VLDL 40 (H) 01/16/2015 1028   LDLCALC 57 12/14/2019 1317     RADIOLOGY: No results found.  ECHO: 06/22/2019 IMPRESSIONS  1. Left ventricular ejection fraction, by estimation, is 60 to 65%. The  left ventricle has normal function. The left ventricle has no regional  wall motion abnormalities. There is moderate left ventricular hypertrophy.  Left ventricular diastolic  parameters are indeterminate.  2. Right ventricular systolic function is normal. The right ventricular  size is normal. There is moderately elevated pulmonary artery systolic  pressure.  3. Left atrial size was moderately dilated.  4. The mitral valve is normal in structure. Mild mitral valve  regurgitation. No evidence of mitral stenosis.  5. Post TAVR with 26 mm Sapien 3 valve stable gradients since 05/19/18  and  no PVL. The aortic valve has been repaired/replaced. Aortic valve  regurgitation is not visualized. No aortic stenosis is present.  6. The inferior vena cava is normal in size with greater than 50%  respiratory variability, suggesting right atrial pressure of 3 mmHg.   FINDINGS  Left Ventricle: Left ventricular ejection fraction, by estimation, is 60  to 65%. The left ventricle has normal function. The left ventricle has no  regional wall motion abnormalities. The left ventricular internal cavity  size was normal in size. There is  moderate left ventricular hypertrophy. Left ventricular diastolic  parameters are indeterminate.   Right Ventricle: The right ventricular size is normal. No increase in  right ventricular wall thickness. Right ventricular systolic function is  normal. There is moderately elevated pulmonary artery systolic pressure.  The tricuspid regurgitant velocity is  3.46 m/s, and with an assumed right atrial pressure of 10 mmHg, the  estimated right ventricular systolic pressure is 74.8 mmHg.   Left Atrium: Left atrial size was moderately dilated.   Right Atrium: Right atrial size was normal in size.   Pericardium: There is no evidence of pericardial effusion.   Mitral Valve: The mitral valve is normal in structure. There is moderate  thickening of the mitral valve leaflet(s). There is moderate calcification  of the mitral valve leaflet(s). Normal mobility of the mitral valve  leaflets. Moderate mitral annular  calcification. Mild mitral valve regurgitation. No evidence of mitral  valve stenosis. MV peak gradient, 17.5 mmHg. The mean mitral valve  gradient is 3.0 mmHg.   Tricuspid Valve: The tricuspid valve is normal in structure. Tricuspid  valve regurgitation is mild . No evidence of tricuspid stenosis.   Aortic Valve: Post TAVR with 26 mm Sapien 3 valve stable gradients since  05/19/18 and no PVL. The aortic valve has been repaired/replaced. Aortic   valve regurgitation is not visualized. No aortic stenosis is present.  Aortic valve mean gradient measures 5.3  mmHg. Aortic valve peak gradient measures 11.6 mmHg.   Pulmonic Valve: The pulmonic valve was normal in structure. Pulmonic valve  regurgitation is not visualized. No evidence of pulmonic stenosis.   Aorta: The aortic root is normal in size and structure.   Venous: The inferior vena cava is normal in size with greater than 50%  respiratory variability, suggesting right atrial pressure of 3 mmHg.   IAS/Shunts: No atrial level shunt detected by color flow Doppler.     LEFT VENTRICLE  PLAX 2D  LVIDd:     3.80 cm Diastology  LVIDs:     2.70 cm LV e' lateral:  4.70 cm/s  LV PW:     1.10 cm LV E/e' lateral: 34.3  LV IVS:    1.60 cm LV e' medial:  4.62 cm/s             LV E/e' medial: 34.8     RIGHT VENTRICLE  RV Basal diam: 2.30 cm  RV S prime:   9.73 cm/s  TAPSE (M-mode): 1.5 cm  RVSP:      55.9 mmHg   LEFT ATRIUM       Index    RIGHT ATRIUM      Index  LA diam:    4.80 cm 2.42 cm/m RA Pressure: 8.00 mmHg  LA Vol (A2C):  59.0 ml 29.80 ml/m RA Area:   13.60 cm  LA Vol (A4C):  31.2 ml 15.76 ml/m RA Volume:  28.50 ml 14.40 ml/m  LA Biplane Vol: 46.0 ml 23.23 ml/m  AORTIC VALVE  AV Vmax:      170.33 cm/s  AV Vmean:     107.633 cm/s  AV VTI:      0.427 m  AV Peak Grad:   11.6 mmHg  AV Mean Grad:   5.3 mmHg  LVOT Vmax:     117.00 cm/s  LVOT Vmean:    78.700 cm/s  LVOT VTI:     0.315 m  LVOT/AV VTI ratio: 0.74   MITRAL VALVE        TRICUSPID VALVE  MV Area (PHT): 3.85 cm   TR Peak grad:  47.9 mmHg  MV Peak grad: 17.5 mmHg  TR Vmax:    346.00 cm/s  MV Mean grad: 3.0 mmHg   Estimated RAP: 8.00 mmHg  MV Vmax:    2.09 m/s   RVSP:      55.9 mmHg  MV Vmean:   78.9 cm/s  MV Decel Time: 197 msec   SHUNTS  MR Peak grad: 77.4  mmHg   Systemic VTI: 0.32 m  MR Mean grad: 35.0 mmHg  MR Vmax:   440.00 cm/s  MR Vmean:   254.0 cm/s  MV E velocity: 161.00 cm/s  MV A velocity: 71.50 cm/s  MV E/A ratio: 2.25    IMPRESSION: 1. Essential hypertension   2. S/P TAVR (transcatheter aortic valve replacement)   3.  PAF (paroxysmal atrial fibrillation) ([place])   4. Sinus bradycardia   5. Hyperlipidemia with target LDL less than 70   6. LBBB (left bundle branch block), chronic   7. Stage 3b chronic kidney disease ([place])   8. Renal artery stenosis ([place]): > 60% on left     ASSESSMENT AND PLAN: Ms. Feijoo is a 56 -year-old female who has documented CAD and PVD who developed progressive aortic valve stenosis and underwent successful TAVR procedure on 08/31/2014 with placement of a 26 mm Edward Sapien 3 Valve from the right femoral approach.  She later developed stage II hypertension resulting in increased medical regimen.   She developed postoperative atrial fibrillation and previouslyconverted back to sinus rhythm on her increased beta blocker dose and has been on warfarin for anticoagulation. An echo Doppler study in July 2018 continued to show normal systolic function.  Her bioprosthetic TAVR valve was functioning well without mobility restriction.  The mean gradient was 10 with a peak gradient of 21.  When I saw her on 04/14/2017, her ECG demonstrated recurrent atrial fibrillation of questionable duration and metoprolol succinate was further titrated titrated up to 100 mg daily.  An echo Doppler study in March 2019 revealed normal systolic function, and grade 2 diastolic dysfunction.  Her bioprosthetic valve was functioning well.  The mean gradient was 12 and peak gradient 25.  There was no aortic insufficiency.  PA peak pressure was 34 mm.  Over the last several years she has had recurrent episodes of paroxysmal atrial fibrillation and in 2020 amiodarone was initiated.  When she was evaluated in November 2021 she had noticed 2  brief episodes of atrial fibrillation.  She was significantly hypertensive.  Medications were adjusted.  Due to bradycardia her diltiazem dose was reduced, irbesartan was increased and chlorthalidone 12.5 mg was added.  She continued to be on low-dose metoprolol succinate.  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  She had developed slight increase in creatinine at the time she was more frequently taking meloxicam.  Over the last several months medications have been adjusted and she is now on chlorthalidone 12.5 mg, diltiazem 120 mg, hydralazine 25 mg twice a day, irbesartan 300 mg daily in addition to metoprolol succinate 25 mg.  Blood pressure today is now controlled and a repeat by me was 124/74.  She is bradycardic but heart rate has improved and is now 55 compared to previously.  She has left bundle branch block which is chronic.  There is evidence for first-degree AV block.  Most recent creatinine was 1.49.  She continues to be on rosuvastatin 20 mg with most recent LDL cholesterol at 57.  Again recommended she try to avoid taking meloxicam as much as possible.  She is to stay hydrated.  She will monitor her blood pressure.  At present she will continue her current regimen.  However if blood pressure increases further titration of hydralazine can be done.  I will see her in 6 months for follow-up evaluation or sooner as needed.   Troy Sine, MD, [facility]  04/29/2020 12:43 PM

## 2020-04-29 NOTE — Telephone Encounter (Signed)
Weekly call today for INR level for 2.0. To continue same dose. Until next week for testing.

## 2020-04-29 NOTE — Patient Instructions (Signed)
Medication Instructions:  The current medical regimen is effective;  continue present plan and medications.  *If you need a refill on your cardiac medications before your next appointment, please call your pharmacy*   Follow-Up: At [facility], you and your health needs are our priority.  As part of our continuing mission to provide you with exceptional heart care, we have created designated Provider Care Teams.  These Care Teams include your primary Cardiologist (physician) and Advanced Practice Providers (APPs -  Physician Assistants and Nurse Practitioners) who all work together to provide you with the care you need, when you need it.  We recommend signing up for the patient portal called "MyChart".  Sign up information is provided on this After Visit Summary.  MyChart is used to connect with patients for Virtual Visits (Telemedicine).  Patients are able to view lab/test results, encounter notes, upcoming appointments, etc.  Non-urgent messages can be sent to your provider as well.   To learn more about what you can do with MyChart, go to [email or web address].    Your next appointment:   6 month(s)  The format for your next appointment:   In Person  Provider:   Shelva Majestic, MD

## 2020-05-01 ENCOUNTER — Other Ambulatory Visit: Payer: Self-pay

## 2020-05-01 ENCOUNTER — Ambulatory Visit (HOSPITAL_COMMUNITY)
Admission: RE | Admit: 2020-05-01 | Discharge: 2020-05-01 | Disposition: A | Discharge status: Home / Self Care | Payer: Medicare Other | Source: Ambulatory Visit | Attending: Surgery | Admitting: Surgery

## 2020-05-01 ENCOUNTER — Encounter: Payer: Self-pay | Admitting: Physician Assistant

## 2020-05-01 ENCOUNTER — Ambulatory Visit (INDEPENDENT_AMBULATORY_CARE_PROVIDER_SITE_OTHER): Payer: Medicare Other | Admitting: Physician Assistant

## 2020-05-01 VITALS — BP 150/76 | HR 98 | Temp 98.0°F | Resp 20 | Ht 70.0 in | Wt 183.5 lb

## 2020-05-01 DIAGNOSIS — I872 Venous insufficiency (chronic) (peripheral): Secondary | ICD-10-CM | POA: Insufficient documentation

## 2020-05-01 DIAGNOSIS — I7409 Other arterial embolism and thrombosis of abdominal aorta: Secondary | ICD-10-CM

## 2020-05-01 DIAGNOSIS — I739 Peripheral vascular disease, unspecified: Secondary | ICD-10-CM | POA: Diagnosis not present

## 2020-05-01 DIAGNOSIS — I701 Atherosclerosis of renal artery: Secondary | ICD-10-CM

## 2020-05-01 NOTE — Progress Notes (Signed)
History of Present Illness:  Patient is a 80 y.o. year old female who presents for evaluation of claudication.  S/P aortobifemoral bypass graft in April of 2012 by Dr. Trula Slade.   She is not having any lower extremity claudication symptoms except when she walks up hills occasionally, rest pain, weakness, or numbness at this time. She has no non healing wounds.   She has chronic edema and continues to wear compression stockings almost daily.  Past Medical History:  Diagnosis Date  . Allergy    rhinitis  . Aortic stenosis, severe   . Aortic stenosis, severe   . Arthritis    spine and various joints  . Cataract   . Cerebrovascular disease 12/05/2018  . Childhood asthma   . Chronic bronchitis ([place])       . Claudication ([place])   . Coronary artery disease    a. April 2012 which revealed mid RCA occlusion with collaterals, 50% LAD stenosis, 30% circumflex stenosis, and 40% marginal stenosis  . GERD (gastroesophageal reflux disease)   . Heart murmur   . Hyperlipidemia   . Hypertension   . LUMBAR RADICULOPATHY, RIGHT 12/21/2007   Qualifier: Diagnosis of  By: Aline Brochure MD, Dorothyann Peng    . LUMBAR RADICULOPATHY, RIGHT   . Peripheral vascular disease ([place])   . 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 Seroma, postoperative    Left Groin  . Severe aortic valve stenosis 08/31/2014  . Stroke [facility]) 1982 X 2   "a little bit weaker on the left side since" (08/29/2014)    Past Surgical History:  Procedure Laterality Date  . AORTO-FEMORAL BYPASS GRAFT Bilateral 06/27/10  . BASAL CELL CARCINOMA EXCISION Left    face  . CARDIAC CATHETERIZATION N/A 07/25/2014   Procedure: Right/Left Heart Cath and Coronary Angiography;  Surgeon: Troy Sine, MD;  Location: [facility];  Service: Cardiovascular;  Laterality: N/A;  . CATARACT EXTRACTION, BILATERAL Bilateral 09/29/2016  . COLONOSCOPY   2008   Dr. Laural Golden: normal.   . DILATION AND CURETTAGE OF UTERUS  "2 or 3"  . ESOPHAGOGASTRODUODENOSCOPY N/A 08/18/2017   Dr. Gala Romney: Normal-appearing esophagus status post dilation.  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  Small hiatal hernia.  Marland Kitchen MALONEY DILATION N/A 08/18/2017   Procedure: Venia Minks DILATION;  Surgeon: Daneil Dolin, MD;  Location[facility];  Service: Endoscopy;  Laterality: N/A;  . MULTIPLE TOOTH EXTRACTIONS    . PR VEIN BYPASS GRAFT,AORTO-FEM-POP  06/12/10  . TEE WITHOUT CARDIOVERSION N/A 08/31/2014   Procedure: TRANSESOPHAGEAL ECHOCARDIOGRAM (TEE);  Surgeon: Rexene Alberts, MD;  Location: [facility];  Service: Open Heart Surgery;  Laterality: N/A;  . TRANSCATHETER AORTIC VALVE REPLACEMENT, TRANSFEMORAL N/A 08/31/2014   Procedure: TRANSCATHETER AORTIC VALVE REPLACEMENT, TRANSFEMORAL approach;  Surgeon: Rexene Alberts, MD;  Location: [facility];  Service: Open Heart Surgery;  Laterality: N/A;  . TUBAL LIGATION  1970's  . VAGINAL HYSTERECTOMY  1970's   Partial     ROS:   General:  No weight loss, Fever, chills  HEENT: No recent headaches, no nasal bleeding, no visual changes, no sore throat  Neurologic: No dizziness, blackouts, seizures. No recent symptoms of stroke or mini- stroke. No recent episodes of slurred speech, or temporary blindness.  Cardiac: No recent episodes of chest pain/pressure, no shortness of breath at rest.  No shortness of breath with exertion.  Denies history of atrial fibrillation  or irregular heartbeat  Vascular: No history of rest pain in feet.  No history of claudication.  No history of non-healing ulcer, No history of DVT   Pulmonary: No home oxygen, no productive cough, no hemoptysis,  No asthma or wheezing  Musculoskeletal:  [ ]  Arthritis, [ ]  Low back pain,  [ ]  Joint pain  Hematologic:No history of hypercoagulable state.  No history of easy bleeding.  No history of anemia  Gastrointestinal: No hematochezia or melena,  No gastroesophageal  reflux, no trouble swallowing  Urinary: [ ]  chronic Kidney disease, [ ]  on HD - [ ]  MWF or [ ]  TTHS, [ ]  Burning with urination, [ ]  Frequent urination, [ ]  Difficulty urinating;   Skin: No rashes  Psychological: No history of anxiety,  No history of depression  Social History Social History   Tobacco Use  . Smoking status: Former Smoker    Packs/day: 2.00    Years: 56.00    Pack years: 112.00    Types: Cigarettes    Start date: 03/02/1952    Quit date: 06/01/2010    Years since quitting: 9.9  . Smokeless tobacco: Never Used  . Tobacco comment: HAD WEANED DOWN TO 1/4 PK A DAY BEFORE D/C  Vaping Use  . Vaping Use: Never used  Substance Use Topics  . Alcohol use: No    Alcohol/week: 0.0 standard drinks    Comment: 08/29/2014 "never drank much; quit in the 1990's"  . Drug use: No    Family History Family History  Problem Relation Age of Onset  . Heart disease Mother 74       died young  . Varicose Veins Mother   . Stroke Father   . Hypertension Father   . Heart disease Father 18       Aneurysm  . Heart attack Father   . Arthritis Brother   . Stroke Brother   . Diabetes Daughter   . Arthritis Daughter   . Allergic rhinitis Neg Hx   . Angioedema Neg Hx   . Asthma Neg Hx   . Atopy Neg Hx   . Eczema Neg Hx   . Immunodeficiency Neg Hx   . Urticaria Neg Hx     Allergies  Allergies  Allergen Reactions  . Adhesive [Tape] Rash  . Latex Rash  . Tetanus Toxoids Rash     Current Outpatient Medications  Medication Sig Dispense Refill  . amiodarone (PACERONE) 200 MG tablet Take 1 tablet by mouth once daily 90 tablet 3  . chlorthalidone (HYGROTON) 25 MG tablet Take 0.5 tablets (12.5 mg total) by mouth daily. 45 tablet 3  . Cholecalciferol (VITAMIN D3) 50 MCG (2000 UT) capsule Take 1 capsule by mouth daily.     Marland Kitchen diltiazem (CARDIZEM CD) 120 MG 24 hr capsule Take 1 capsule (120 mg total) by mouth daily. 90 capsule 3  . estradiol (ESTRACE) 2 MG tablet Take 1 tablet by  mouth once daily 90 tablet 0  . hydrALAZINE (APRESOLINE) 25 MG tablet Take 1 tablet (25 mg total) by mouth in the morning and at bedtime. 60 tablet 6  . ipratropium (ATROVENT) 0.06 % nasal spray USE 1 SPRAY(S) IN EACH NOSTRIL EVERY 6 HOURS AS NEEDED FOR RHINITIS 15 mL 2  . irbesartan (AVAPRO) 300 MG tablet Take 1 tablet (300 mg total) by mouth daily. 90 tablet 3  . meloxicam (MOBIC) 15 MG tablet Take 1 tablet by mouth once daily 30 tablet 0  . metoprolol succinate (TOPROL XL)  25 MG 24 hr tablet Take 1 tablet (25 mg total) by mouth daily. 90 tablet 3  . nitroGLYCERIN (NITROSTAT) 0.4 MG SL tablet Place 1 tablet (0.4 mg total) under the tongue every 5 (five) minutes as needed for chest pain. 25 tablet 12  . pantoprazole (PROTONIX) 40 MG tablet TAKE 1 TABLET BY MOUTH ONCE DAILY BEFORE SUPPER 90 tablet 3  . rosuvastatin (CRESTOR) 20 MG tablet TAKE 1 TABLET BY MOUTH AT BEDTIME 90 tablet 0  . warfarin (COUMADIN) 2.5 MG tablet Take 1 tablet by mouth once daily 90 tablet 0  . NP THYROID 90 MG tablet Take 1 tablet by mouth once daily 30 tablet 3   No current facility-administered medications for this visit.    Physical Examination  Vitals:   05/01/20 1111  BP: (!) 150/76  Pulse: 98  Resp: 20  Temp: 98 F (36.7 C)  TempSrc: Temporal  SpO2: 97%  Weight: 183 lb 8 oz (83.2 kg)  Height: 5\' 10"  (1.778 m)    Body mass index is 26.33 kg/m.  General:  Alert and oriented, no acute distress HEENT: Normal Neck: No bruit or JVD Pulmonary: Clear to auscultation bilaterally Cardiac: Regular Rate and Rhythm without murmur Abdomen: Soft, non-tender, non-distended, no mass, no scars Skin: No rash, brawny skin changes lower legs B Extremity Pulses:  2+ radial, brachial, femoral, AT pulses bilaterally Musculoskeletal: No deformity or edema  Neurologic: Upper and lower extremity motor  and symmetric  DATA:     ABI Findings:  +---------+------------------+-----+--------+--------+  Right  Rt  Pressure (mmHg)IndexWaveformComment   +---------+------------------+-----+--------+--------+  Brachial 162                     +---------+------------------+-----+--------+--------+  PTA   175        1.08 biphasic      +---------+------------------+-----+--------+--------+  DP    150        0.93 biphasic      +---------+------------------+-----+--------+--------+  Great Toe104        0.64           +---------+------------------+-----+--------+--------+   +---------+------------------+-----+---------+-------+  Left   Lt Pressure (mmHg)IndexWaveform Comment  +---------+------------------+-----+---------+-------+  Brachial 153                      +---------+------------------+-----+---------+-------+  PTA   164        1.01 triphasic      +---------+------------------+-----+---------+-------+  DP    158        0.98 biphasic       +---------+------------------+-----+---------+-------+  Great Toe89        0.55            +---------+------------------+-----+---------+-------+   +-------+-----------+-----------+------------+------------+  ABI/TBIToday's ABIToday's TBIPrevious ABIPrevious TBI  +-------+-----------+-----------+------------+------------+  Right 1.08    0.64    1.12    0.98      +-------+-----------+-----------+------------+------------+  Left  1.01    0.55    1.13    1.01      +-------+-----------+-----------+------------+------------+   ASSESSMENT:  S/P  aortobifemoral bypass graft in April of 2012 by Dr. Trula Slade. She remains relatively stable with some claudication only when walking up an incline. No significant changes to ABI's with palpable AT pulses B.   PLAN: Continue to stay as active as possible with walking.  She wears compression daily  secondary to history of DVT and venous reflux.  She is on Coumadin for life.  F/u for repeat ABI in 1 year.     Terrence Dupont  Gerri Lins PA-C [facility] Office: [phone]  MD in office [place]

## 2020-05-07 LAB — POCT INR: INR: 3 — AB (ref 2–3)

## 2020-05-08 ENCOUNTER — Telehealth (INDEPENDENT_AMBULATORY_CARE_PROVIDER_SITE_OTHER): Payer: Self-pay | Admitting: Nurse Practitioner

## 2020-05-08 ENCOUNTER — Ambulatory Visit (INDEPENDENT_AMBULATORY_CARE_PROVIDER_SITE_OTHER): Payer: Self-pay | Admitting: Nurse Practitioner

## 2020-05-08 NOTE — Telephone Encounter (Signed)
Called patient and gave her the message from Judson Roch. Patient verbalized an understanding and thanked [place].

## 2020-05-08 NOTE — Telephone Encounter (Signed)
Please call this patient and let her know that she should continue her current dose of warfarin and recheck her INR again next week. Thank you.

## 2020-05-08 NOTE — Progress Notes (Signed)
Patient to continue current dose

## 2020-05-13 ENCOUNTER — Other Ambulatory Visit (INDEPENDENT_AMBULATORY_CARE_PROVIDER_SITE_OTHER): Payer: Self-pay | Admitting: Nurse Practitioner

## 2020-05-13 LAB — POCT INR: INR: 2.4 (ref 2.0–3.0)

## 2020-05-19 ENCOUNTER — Other Ambulatory Visit (INDEPENDENT_AMBULATORY_CARE_PROVIDER_SITE_OTHER): Payer: Self-pay | Admitting: Internal Medicine

## 2020-05-20 ENCOUNTER — Ambulatory Visit (INDEPENDENT_AMBULATORY_CARE_PROVIDER_SITE_OTHER): Payer: Self-pay | Admitting: Nurse Practitioner

## 2020-05-20 ENCOUNTER — Telehealth (INDEPENDENT_AMBULATORY_CARE_PROVIDER_SITE_OTHER): Payer: Self-pay

## 2020-05-20 ENCOUNTER — Other Ambulatory Visit: Payer: Self-pay

## 2020-05-20 ENCOUNTER — Encounter (INDEPENDENT_AMBULATORY_CARE_PROVIDER_SITE_OTHER): Payer: Self-pay | Admitting: Internal Medicine

## 2020-05-20 ENCOUNTER — Telehealth (INDEPENDENT_AMBULATORY_CARE_PROVIDER_SITE_OTHER): Payer: Medicare Other | Admitting: Internal Medicine

## 2020-05-20 ENCOUNTER — Telehealth (INDEPENDENT_AMBULATORY_CARE_PROVIDER_SITE_OTHER): Payer: Self-pay | Admitting: Nurse Practitioner

## 2020-05-20 VITALS — Ht 70.0 in

## 2020-05-20 DIAGNOSIS — Z7901 Long term (current) use of anticoagulants: Secondary | ICD-10-CM | POA: Diagnosis not present

## 2020-05-20 DIAGNOSIS — R509 Fever, unspecified: Secondary | ICD-10-CM

## 2020-05-20 DIAGNOSIS — R059 Cough, unspecified: Secondary | ICD-10-CM | POA: Diagnosis not present

## 2020-05-20 LAB — POCT INR: INR: 3.1 — AB (ref 2–3)

## 2020-05-20 MED ORDER — AZITHROMYCIN 250 MG PO TABS: ORAL_TABLET | ORAL | 0 refills | Status: DC

## 2020-05-20 NOTE — Telephone Encounter (Signed)
Please call this patient and recommend that she skip one dose of her coumadin. She should recheck INR again next week. Thank you.

## 2020-05-20 NOTE — Progress Notes (Signed)
Metrics: Intervention Frequency ACO  Documented Smoking Status Yearly  Screened one or more times in 24 months  Cessation Counseling or  Active cessation medication Past 24 months  Past 24 months   Guideline developer: UpToDate (See UpToDate for funding source) Date Released: 2014       Wellness Office Visit  Subjective:  Patient ID: Katelyn [name], female    DOB: January 03, 1941  Age: 80 y.o. MRN: [id number]  CC: This is an audio telemedicine visit with the permission of the patient who is at home and I am in my office.  I used 2 identifiers to identify the patient.  Unfortunately, video technology was not possible in this case. Productive cough, fever. HPI  This patient describes a 6-day history of the above symptoms and also feeling lethargic.  She denies any wheezing or shortness of breath.  She says somewhat feeling better today.  3 days ago, she took a COVID-19 test and it was negative. Past Medical History:  Diagnosis Date  . Allergy    rhinitis  . Aortic stenosis, severe   . Aortic stenosis, severe   . Arthritis    spine and various joints  . Cataract   . Cerebrovascular disease 12/05/2018  . Childhood asthma   . Chronic bronchitis ([place])       . Claudication ([place])   . Coronary artery disease    a. April 2012 which revealed mid RCA occlusion with collaterals, 50% LAD stenosis, 30% circumflex stenosis, and 40% marginal stenosis  . GERD (gastroesophageal reflux disease)   . Heart murmur   . Hyperlipidemia   . Hypertension   . LUMBAR RADICULOPATHY, RIGHT 12/21/2007   Qualifier: Diagnosis of  By: Aline Brochure MD, Dorothyann Peng    . LUMBAR RADICULOPATHY, RIGHT   . Peripheral vascular disease ([place])   . 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 Seroma, postoperative    Left Groin  . Severe aortic valve stenosis 08/31/2014  . Stroke [facility]) 1982 X 2   "a little bit weaker on  the left side since" (08/29/2014)   Past Surgical History:  Procedure Laterality Date  . AORTO-FEMORAL BYPASS GRAFT Bilateral 06/27/10  . BASAL CELL CARCINOMA EXCISION Left    face  . CARDIAC CATHETERIZATION N/A 07/25/2014   Procedure: Right/Left Heart Cath and Coronary Angiography;  Surgeon: Troy Sine, MD;  Location: [place] [facility];  Service: Cardiovascular;  Laterality: N/A;  . CATARACT EXTRACTION, BILATERAL Bilateral 09/29/2016  . COLONOSCOPY  2008   Dr. Laural Golden: normal.   . DILATION AND CURETTAGE OF UTERUS  "2 or 3"  . ESOPHAGOGASTRODUODENOSCOPY N/A 08/18/2017   Dr. Gala Romney: Normal-appearing esophagus status post dilation.  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  Small hiatal hernia.  Marland Kitchen MALONEY DILATION N/A 08/18/2017   Procedure: Venia Minks DILATION;  Surgeon: Daneil Dolin, MD;  [facility];  Service: Endoscopy;  Laterality: N/A;  . MULTIPLE TOOTH EXTRACTIONS    . PR VEIN BYPASS GRAFT,AORTO-FEM-POP  06/12/10  . TEE WITHOUT CARDIOVERSION N/A 08/31/2014   Procedure: TRANSESOPHAGEAL ECHOCARDIOGRAM (TEE);  Surgeon: Rexene Alberts, MD;  Location: [place];  Service: Open Heart Surgery;  Laterality: N/A;  . TRANSCATHETER AORTIC VALVE REPLACEMENT, TRANSFEMORAL N/A 08/31/2014   Procedure: TRANSCATHETER AORTIC VALVE REPLACEMENT, TRANSFEMORAL approach;  Surgeon: Rexene Alberts, MD;  Location: [place];  Service: Open Heart Surgery;  Laterality: N/A;  . TUBAL LIGATION  1970's  .  VAGINAL HYSTERECTOMY  1970's   Partial      Family History  Problem Relation Age of Onset  . Heart disease Mother 37       died young  . Varicose Veins Mother   . Stroke Father   . Hypertension Father   . Heart disease Father 54       Aneurysm  . Heart attack Father   . Arthritis Brother   . Stroke Brother   . Diabetes Daughter   . Arthritis Daughter   . Allergic rhinitis Neg Hx   . Angioedema Neg Hx   . Asthma Neg Hx   . Atopy Neg Hx   . Eczema Neg Hx   . Immunodeficiency Neg Hx   .  Urticaria Neg Hx     Social History   Social History Narrative   Normanna husband in November 2020. Retired,used to work at [employer].Lives alone.         Patient has one daughter, 3 grands   Likes to crochet   Works in [employer].    Social History   Tobacco Use  . Smoking status: Former Smoker    Packs/day: 2.00    Years: 56.00    Pack years: 112.00    Types: Cigarettes    Start date: 03/02/1952    Quit date: 06/01/2010    Years since quitting: 9.9  . Smokeless tobacco: Never Used  . Tobacco comment: HAD WEANED DOWN TO 1/4 PK A DAY BEFORE D/C  Substance Use Topics  . Alcohol use: No    Alcohol/week: 0.0 standard drinks    Comment: 08/29/2014 "never drank much; quit in the 1990's"    Current Meds  Medication Sig  . amiodarone (PACERONE) 200 MG tablet Take 1 tablet by mouth once daily  . azithromycin (ZITHROMAX) 250 MG tablet Take 2 tablets the first day and then 1 tablet every day for the next 4 days  . Cholecalciferol (VITAMIN D3) 50 MCG (2000 UT) capsule Take 1 capsule by mouth daily.   Marland Kitchen diltiazem (CARDIZEM CD) 120 MG 24 hr capsule Take 1 capsule (120 mg total) by mouth daily.  Marland Kitchen estradiol (ESTRACE) 2 MG tablet Take 1 tablet by mouth once daily  . hydrALAZINE (APRESOLINE) 25 MG tablet Take 1 tablet (25 mg total) by mouth in the morning and at bedtime.  Marland Kitchen ipratropium (ATROVENT) 0.06 % nasal spray USE 1 SPRAY(S) IN EACH NOSTRIL EVERY 6 HOURS AS NEEDED FOR RUNNY NOSE  . irbesartan (AVAPRO) 300 MG tablet Take 1 tablet (300 mg total) by mouth daily.  . meloxicam (MOBIC) 15 MG tablet Take 1 tablet by mouth once daily  . metoprolol succinate (TOPROL XL) 25 MG 24 hr tablet Take 1 tablet (25 mg total) by mouth daily.  . nitroGLYCERIN (NITROSTAT) 0.4 MG SL tablet Place 1 tablet (0.4 mg total) under the tongue every 5 (five) minutes as needed for chest pain.  . pantoprazole (PROTONIX) 40 MG tablet TAKE 1 TABLET BY MOUTH ONCE DAILY BEFORE SUPPER  . rosuvastatin (CRESTOR) 20  MG tablet TAKE 1 TABLET BY MOUTH AT BEDTIME  . warfarin (COUMADIN) 2.5 MG tablet Take 1 tablet by mouth once daily       Objective:   Today's Vitals: Ht 5\' 10"  (1.778 m)   BMI 26.33 kg/m  Vitals with BMI 05/20/2020 05/01/2020 04/29/2020  Height 5\' 10"  5\' 10"  5\' 10"   Weight (No Data) 183 lbs 8 oz 182 lbs 10 oz  BMI - 09.73 53.2  Systolic (  No Data) 308 657  Diastolic (No Data) 76 74  Pulse (No Data) 98 55     Physical Exam  Virtual visit.  The patient does not appear to be dyspneic while talking to me.  She appears to be alert and oriented.     Assessment   1. Cough   2. Fever, unspecified fever cause       Tests ordered No orders of the defined types were placed in this encounter.    Plan: 1. I will treated empirically as a lung infection with antibiotics although this may entirely be viral in origin.  I have urged her to get another COVID-19 test today and if it is negative, then she will have 2 - tests which make it less likely that she does in fact have COVID-19 disease.  If it is positive, she will let us know. 2. If things do not improve, she will also let us know. 3. This phone call lasted 5 minutes.   Meds ordered this encounter  Medications  . azithromycin (ZITHROMAX) 250 MG tablet    Sig: Take 2 tablets the first day and then 1 tablet every day for the next 4 days    Dispense:  6 tablet    Refill:  0    Nimish Luther Parody, MD

## 2020-05-20 NOTE — Telephone Encounter (Signed)
Estill Bamberg called from [facility] in [place] and stated that a Zpack was sent in for the patient and this interacted with her Warfarin. Please confirm if you want to use this antibiotic or choose a different antibiotic? [phone]

## 2020-05-20 NOTE — Telephone Encounter (Signed)
Called patient and gave her the message. Patient verbalized an understanding and stated that she was not feeling well. Patient stated that she has a cough and started last Tuesday. Now thick mucous is coming up and she does have a runny nose and her ribs are sore from coughing. Patient had a Negative Covid test. Patient is worried that it could be bronchitis. I scheduled her for a virtual visit today at 4:45pm with Dr. Anastasio Champion.

## 2020-05-20 NOTE — Telephone Encounter (Signed)
Yes, continue with the Z-Pak.

## 2020-05-20 NOTE — Telephone Encounter (Signed)
Called [facility] and spoke to [place] and gave her the message per Dr. Anastasio Champion. Estill Bamberg verbalized an understanding.

## 2020-05-21 ENCOUNTER — Telehealth (INDEPENDENT_AMBULATORY_CARE_PROVIDER_SITE_OTHER): Payer: Self-pay

## 2020-05-21 ENCOUNTER — Other Ambulatory Visit (INDEPENDENT_AMBULATORY_CARE_PROVIDER_SITE_OTHER): Payer: Self-pay | Admitting: Internal Medicine

## 2020-05-21 MED ORDER — GUAIFENESIN-CODEINE 100-10 MG/5ML PO SYRP: 5.0000 mL | ORAL_SOLUTION | Freq: Three times a day (TID) | ORAL | 0 refills | Status: DC | PRN

## 2020-05-21 NOTE — Telephone Encounter (Signed)
Please let the patient know that I have sent a prescription of Robitussin with codeine to [facility] in [place].  Hopefully this will help her cough.  If her cough gets worse and especially if her breathing gets worse, she needs to go to the emergency room.

## 2020-05-21 NOTE — Telephone Encounter (Signed)
Patient called and left a voice message and stated that she is not able to talk and her cough is really bad. Patient stated that her Covid test was negative. Patient is not able to go get medication for her cough. Patient did get Zpack and starting today. Please advise.

## 2020-05-22 NOTE — Telephone Encounter (Signed)
Called patient and gave her the message. Patient thinks her antibiotic is helping. Patient verbalized an understanding.

## 2020-05-23 NOTE — Telephone Encounter (Signed)
Called patient and she stated that she is feeling a little better today and she ate soup yesterday and has been staying hydrated. Patient advised to call back if becomes worse or go to ER. Patient verbalized an understanding.

## 2020-05-27 ENCOUNTER — Encounter: Payer: Self-pay | Admitting: Nurse Practitioner

## 2020-05-27 LAB — POCT INR: INR: 4 — AB (ref 2.0–3.0)

## 2020-05-29 ENCOUNTER — Telehealth (INDEPENDENT_AMBULATORY_CARE_PROVIDER_SITE_OTHER): Payer: Self-pay | Admitting: Nurse Practitioner

## 2020-05-29 ENCOUNTER — Ambulatory Visit (INDEPENDENT_AMBULATORY_CARE_PROVIDER_SITE_OTHER): Payer: Self-pay | Admitting: Nurse Practitioner

## 2020-05-29 ENCOUNTER — Other Ambulatory Visit (INDEPENDENT_AMBULATORY_CARE_PROVIDER_SITE_OTHER): Payer: Self-pay | Admitting: Internal Medicine

## 2020-05-29 LAB — POCT INR: INR: 4 — AB (ref 2–3)

## 2020-05-29 NOTE — Telephone Encounter (Addendum)
Please call this patient and let her know that we received her INR and that it is above goal at 4.0. Please tell her to hold 1 dose of her warfarin and to recheck INR again next week. Thank you.

## 2020-05-29 NOTE — Telephone Encounter (Signed)
Called patient and gave her the message. Patient stated that she already with held a dose since she knew the results. Patient verbalized an understanding.

## 2020-06-03 ENCOUNTER — Other Ambulatory Visit (INDEPENDENT_AMBULATORY_CARE_PROVIDER_SITE_OTHER): Payer: Self-pay | Admitting: Internal Medicine

## 2020-06-03 ENCOUNTER — Encounter: Payer: Self-pay | Admitting: Nurse Practitioner

## 2020-06-03 LAB — POCT INR: INR: 2.1 (ref 2.0–3.0)

## 2020-06-05 ENCOUNTER — Ambulatory Visit (INDEPENDENT_AMBULATORY_CARE_PROVIDER_SITE_OTHER): Payer: Self-pay | Admitting: Nurse Practitioner

## 2020-06-05 ENCOUNTER — Telehealth (INDEPENDENT_AMBULATORY_CARE_PROVIDER_SITE_OTHER): Payer: Self-pay | Admitting: Nurse Practitioner

## 2020-06-05 LAB — POCT INR: INR: 2.1 (ref 2–3)

## 2020-06-05 NOTE — Telephone Encounter (Signed)
Called patient and gave her the message from Judson Roch. Patient verbalized an understanding.

## 2020-06-05 NOTE — Telephone Encounter (Addendum)
Please call patient let her know that I got her INR result at 2.1.  She should continue taking her current dose of warfarin as prescribed.  Encourage her to check her INR again next week.

## 2020-06-10 ENCOUNTER — Encounter (INDEPENDENT_AMBULATORY_CARE_PROVIDER_SITE_OTHER): Payer: Self-pay

## 2020-06-10 ENCOUNTER — Telehealth (INDEPENDENT_AMBULATORY_CARE_PROVIDER_SITE_OTHER): Payer: Self-pay

## 2020-06-10 LAB — POCT INR: INR: 2.7 — AB (ref <=1.1)

## 2020-06-10 NOTE — Telephone Encounter (Signed)
She can continue on her current dose and retest next week. Thank you.

## 2020-06-10 NOTE — Telephone Encounter (Signed)
Received a fax with INR results of 2.7 from 06/10/2020  Please advise.

## 2020-06-10 NOTE — Telephone Encounter (Signed)
Called patient and LMOVM to return call  Left a detailed voice message for patient in regards to the message from Judson Roch.

## 2020-06-12 ENCOUNTER — Encounter (INDEPENDENT_AMBULATORY_CARE_PROVIDER_SITE_OTHER): Payer: Medicare Other | Admitting: Nurse Practitioner

## 2020-06-17 DIAGNOSIS — Z7901 Long term (current) use of anticoagulants: Secondary | ICD-10-CM | POA: Diagnosis not present

## 2020-06-17 LAB — PROTIME-INR

## 2020-06-19 ENCOUNTER — Ambulatory Visit (INDEPENDENT_AMBULATORY_CARE_PROVIDER_SITE_OTHER): Payer: Self-pay | Admitting: Nurse Practitioner

## 2020-06-19 ENCOUNTER — Encounter (INDEPENDENT_AMBULATORY_CARE_PROVIDER_SITE_OTHER): Payer: Self-pay | Admitting: Nurse Practitioner

## 2020-06-19 ENCOUNTER — Other Ambulatory Visit: Payer: Self-pay

## 2020-06-19 ENCOUNTER — Telehealth (INDEPENDENT_AMBULATORY_CARE_PROVIDER_SITE_OTHER): Payer: Medicare Other | Admitting: Nurse Practitioner

## 2020-06-19 ENCOUNTER — Telehealth (INDEPENDENT_AMBULATORY_CARE_PROVIDER_SITE_OTHER): Payer: Self-pay | Admitting: Nurse Practitioner

## 2020-06-19 ENCOUNTER — Ambulatory Visit (INDEPENDENT_AMBULATORY_CARE_PROVIDER_SITE_OTHER): Payer: Medicare Other | Admitting: Nurse Practitioner

## 2020-06-19 VITALS — Ht 70.0 in

## 2020-06-19 DIAGNOSIS — B379 Candidiasis, unspecified: Secondary | ICD-10-CM | POA: Diagnosis not present

## 2020-06-19 DIAGNOSIS — R059 Cough, unspecified: Secondary | ICD-10-CM | POA: Diagnosis not present

## 2020-06-19 LAB — POCT INR: INR: 2.9 (ref 2–3)

## 2020-06-19 MED ORDER — GUAIFENESIN-CODEINE 100-10 MG/5ML PO SYRP
5.0000 mL | ORAL_SOLUTION | Freq: Three times a day (TID) | ORAL | 0 refills | Status: DC | PRN
Start: 2020-06-19 — End: 2020-07-10

## 2020-06-19 MED ORDER — DOXYCYCLINE HYCLATE 100 MG PO TABS: 100.0000 mg | ORAL_TABLET | Freq: Two times a day (BID) | ORAL | 0 refills | Status: DC

## 2020-06-19 MED ORDER — FLUCONAZOLE 150 MG PO TABS: 150.0000 mg | ORAL_TABLET | Freq: Once | ORAL | 0 refills | Status: AC

## 2020-06-19 NOTE — Telephone Encounter (Signed)
Please call this patient and notify her that we got her INR results and we recommend she continue on her current dose of warfarin and recheck INR next week. Thank you.

## 2020-06-19 NOTE — Progress Notes (Signed)
Patient to continue on same dose and retest next week.

## 2020-06-19 NOTE — Progress Notes (Signed)
Due to national recommendations of social distancing related to the [place] pandemic, an audio-only tele-health visit was felt to be the most appropriate encounter type for this patient today. I connected with  Katelyn [name] on 06/19/20 utilizing audio-only technology and verified that I am speaking with the correct person using two identifiers. The patient was located at their home, and I was located at the office of [facility] during the encounter. I discussed the limitations of evaluation and management by telemedicine. The patient expressed understanding and agreed to proceed.   Subjective:  Patient ID: Katelyn [name], female    DOB: 03/27/1940  Age: 80 y.o. MRN: [id number]  CC:  Chief Complaint  Patient presents with  . Cough    Patient stated that her cough came back and it is hurting her really bad anytime she coughs in her chest, SOB with exertion, started getting worse last Friday, coughing is worse at night, uses [facility]      HPI  This patient arrives today for virtual visit for the above.  She tells me that approximately 5 days ago she started coughing, she does produce a small amount of sputum, and has some shortness of breath with exertion.  She has been a bit more fatigued but denies any fever or chills.  She has been around her great grandchildren who have also been sick.  She has been vaccinated against COVID-19, she has not had a booster.  She has taken 2 at home test for COVID-19 both which have been negative.  Past Medical History:  Diagnosis Date  . Allergy    rhinitis  . Aortic stenosis, severe   . Aortic stenosis, severe   . Arthritis    spine and various joints  . Cataract   . Cerebrovascular disease 12/05/2018  . Childhood asthma   . Chronic bronchitis ([place])       . Claudication ([place])   . Coronary artery disease    a. April 2012 which revealed mid RCA occlusion with collaterals, 50% LAD stenosis, 30% circumflex stenosis, and 40%  marginal stenosis  . GERD (gastroesophageal reflux disease)   . Heart murmur   . Hyperlipidemia   . Hypertension   . LUMBAR RADICULOPATHY, RIGHT 12/21/2007   Qualifier: Diagnosis of  By: Aline Brochure MD, Dorothyann Peng    . LUMBAR RADICULOPATHY, RIGHT   . Peripheral vascular disease ([place])   . 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 Seroma, postoperative    Left Groin  . Severe aortic valve stenosis 08/31/2014  . Stroke [facility]) 1982 X 2   "a little bit weaker on the left side since" (08/29/2014)      Family History  Problem Relation Age of Onset  . Heart disease Mother 51       died young  . Varicose Veins Mother   . Stroke Father   . Hypertension Father   . Heart disease Father 4       Aneurysm  . Heart attack Father   . Arthritis Brother   . Stroke Brother   . Diabetes Daughter   . Arthritis Daughter   . Allergic rhinitis Neg Hx   . Angioedema Neg Hx   . Asthma Neg Hx   . Atopy Neg Hx   . Eczema Neg Hx   . Immunodeficiency Neg Hx   . Urticaria Neg Hx  Social History   Social History Narrative   Maury husband in November 2020. Retired,used to work at [employer].Lives alone.         Patient has one daughter, 3 grands   Likes to crochet   Works in [employer].    Social History   Tobacco Use  . Smoking status: Former Smoker    Packs/day: 2.00    Years: 56.00    Pack years: 112.00    Types: Cigarettes    Start date: 03/02/1952    Quit date: 06/01/2010    Years since quitting: 10.0  . Smokeless tobacco: Never Used  . Tobacco comment: HAD WEANED DOWN TO 1/4 PK A DAY BEFORE D/C  Substance Use Topics  . Alcohol use: No    Alcohol/week: 0.0 standard drinks    Comment: 08/29/2014 "never drank much; quit in the 1990's"     Current Meds  Medication Sig  . amiodarone (PACERONE) 200 MG tablet Take 1 tablet by mouth once daily  . Cholecalciferol  (VITAMIN D3) 50 MCG (2000 UT) capsule Take 1 capsule by mouth daily.   Marland Kitchen diltiazem (CARDIZEM CD) 120 MG 24 hr capsule Take 1 capsule (120 mg total) by mouth daily.  Marland Kitchen doxycycline (VIBRA-TABS) 100 MG tablet Take 1 tablet (100 mg total) by mouth 2 (two) times daily.  Marland Kitchen estradiol (ESTRACE) 2 MG tablet Take 1 tablet by mouth once daily  . fluconazole (DIFLUCAN) 150 MG tablet Take 1 tablet (150 mg total) by mouth once for 1 dose.  . hydrALAZINE (APRESOLINE) 25 MG tablet Take 1 tablet (25 mg total) by mouth in the morning and at bedtime.  Marland Kitchen ipratropium (ATROVENT) 0.06 % nasal spray USE 1 SPRAY(S) IN EACH NOSTRIL EVERY 6 HOURS AS NEEDED FOR RUNNY NOSE  . irbesartan (AVAPRO) 300 MG tablet Take 1 tablet (300 mg total) by mouth daily.  . meloxicam (MOBIC) 15 MG tablet Take 1 tablet by mouth once daily  . metoprolol succinate (TOPROL XL) 25 MG 24 hr tablet Take 1 tablet (25 mg total) by mouth daily.  . nitroGLYCERIN (NITROSTAT) 0.4 MG SL tablet Place 1 tablet (0.4 mg total) under the tongue every 5 (five) minutes as needed for chest pain.  . NP THYROID 90 MG tablet Take 1 tablet by mouth once daily  . pantoprazole (PROTONIX) 40 MG tablet TAKE 1 TABLET BY MOUTH ONCE DAILY BEFORE SUPPER  . rosuvastatin (CRESTOR) 20 MG tablet TAKE 1 TABLET BY MOUTH AT BEDTIME  . warfarin (COUMADIN) 2.5 MG tablet Take 1 tablet by mouth once daily  . [DISCONTINUED] guaiFENesin-codeine (ROBITUSSIN AC) 100-10 MG/5ML syrup Take 5 mLs by mouth 3 (three) times daily as needed for cough.    ROS:  Review of Systems  Constitutional: Positive for malaise/fatigue. Negative for chills and fever.  Respiratory: Positive for cough, sputum production (small amount) and shortness of breath (with exertion).   Cardiovascular: Positive for chest pain (with coughing).  Neurological: Negative for dizziness and headaches.     Objective:   Today's Vitals: Ht 5\' 10"  (1.778 m)   BMI 26.33 kg/m  Vitals with BMI 06/19/2020 05/20/2020 05/01/2020   Height 5\' 10"  5\' 10"  5\' 10"   Weight (No Data) (No Data) 183 lbs 8 oz  BMI - - 35.70  Systolic (No Data) (No Data) 177  Diastolic (No Data) (No Data) 76  Pulse (No Data) (No Data) 98     Physical Exam Comprehensive physical exam not completed today as office visit was conducted remotely.  Patient  sounded fairly well over the phone, she was able to speak in complete sentences without having to stop to breathe.  She did sound a bit congested.  Patient was alert and oriented, and appeared to have appropriate judgment.    Assessment and Plan   1. Cough   2. Yeast infection      Plan: 1.,  2.  We will send her for chest x-ray and will start treating her empirically for pneumonia.  We will start her on doxycycline 100 mg by mouth twice a day for 7 days.  She does mention she will get a vaginal yeast infection on this medication so I will prescribe her 1 dose of Diflucan that she can take if she starts to experience symptoms of vaginal yeast infection.  We will also refill her cough suppressant syrup to see if this will help with the cough.  She will follow-up as scheduled next week at which point we will see how she is feeling.  I did discuss the possible concerns with medication reactions between warfarin and doxycycline and Diflucan.  I did tell her that she should check her INR about 2 days into the treatment of doxycycline, and if it is above 3 she should skip the dose of warfarin.  She tells me she understands.  I did tell her if her chest x-ray comes back and there is no signs of pneumonia we may call her to tell her to stop taking the doxycycline, however due to her age and risk for negative outcomes for pneumonia we will err on the side of caution and start by treating today with antibiotic.  She is agreeable to this.   Tests ordered Orders Placed This Encounter  Procedures  . DG Chest 2 View      Meds ordered this encounter  Medications  . guaiFENesin-codeine (ROBITUSSIN AC)  100-10 MG/5ML syrup    Sig: Take 5 mLs by mouth 3 (three) times daily as needed for cough.    Dispense:  120 mL    Refill:  0    Order Specific Question:   Supervising Provider    Answer:   Anastasio Champion, NIMISH C [[id number]]  . doxycycline (VIBRA-TABS) 100 MG tablet    Sig: Take 1 tablet (100 mg total) by mouth 2 (two) times daily.    Dispense:  14 tablet    Refill:  0    Order Specific Question:   Supervising Provider    Answer:   Hurshel Party C [[id number]]  . fluconazole (DIFLUCAN) 150 MG tablet    Sig: Take 1 tablet (150 mg total) by mouth once for 1 dose.    Dispense:  1 tablet    Refill:  0    Order Specific Question:   Supervising Provider    Answer:   Doree Albee [[id number]]    Patient to follow-up as scheduled next week or sooner as needed. Total time on the phone was 9 minutes and 5 seconds.  Ailene Ards, NP

## 2020-06-19 NOTE — Telephone Encounter (Signed)
Called patient and gave her the message about her INR and coumadin per Judson Roch. Patient verbalized an understanding and stated that she was not feeling well and coughing. Coughing hurts her chest really really bad and patient stated she was having SOB with exertion when she takes her dog out to go potty. I scheduled patient with Judson Roch today and advised Judson Roch of her symptoms. Patient verbalized an understanding.

## 2020-06-21 ENCOUNTER — Other Ambulatory Visit: Payer: Self-pay

## 2020-06-21 ENCOUNTER — Ambulatory Visit (HOSPITAL_COMMUNITY)
Admission: RE | Admit: 2020-06-21 | Discharge: 2020-06-21 | Disposition: A | Discharge status: Home / Self Care | Payer: Medicare Other | Source: Ambulatory Visit | Attending: Nurse Practitioner | Admitting: Nurse Practitioner

## 2020-06-21 DIAGNOSIS — R059 Cough, unspecified: Secondary | ICD-10-CM | POA: Diagnosis not present

## 2020-06-24 ENCOUNTER — Ambulatory Visit (INDEPENDENT_AMBULATORY_CARE_PROVIDER_SITE_OTHER): Payer: Medicare Other | Admitting: Internal Medicine

## 2020-06-24 ENCOUNTER — Telehealth (INDEPENDENT_AMBULATORY_CARE_PROVIDER_SITE_OTHER): Payer: Self-pay

## 2020-06-24 ENCOUNTER — Other Ambulatory Visit (INDEPENDENT_AMBULATORY_CARE_PROVIDER_SITE_OTHER): Payer: Self-pay | Admitting: Internal Medicine

## 2020-06-24 ENCOUNTER — Other Ambulatory Visit: Payer: Self-pay

## 2020-06-24 ENCOUNTER — Encounter (INDEPENDENT_AMBULATORY_CARE_PROVIDER_SITE_OTHER): Payer: Self-pay | Admitting: Internal Medicine

## 2020-06-24 ENCOUNTER — Encounter (INDEPENDENT_AMBULATORY_CARE_PROVIDER_SITE_OTHER): Payer: Self-pay

## 2020-06-24 VITALS — BP 158/72 | HR 57 | Temp 97.5°F | Ht 70.0 in | Wt 179.2 lb

## 2020-06-24 DIAGNOSIS — R918 Other nonspecific abnormal finding of lung field: Secondary | ICD-10-CM

## 2020-06-24 DIAGNOSIS — N1832 Chronic kidney disease, stage 3b: Secondary | ICD-10-CM

## 2020-06-24 DIAGNOSIS — R911 Solitary pulmonary nodule: Secondary | ICD-10-CM | POA: Diagnosis not present

## 2020-06-24 LAB — POCT INR: INR: 3.1 — AB (ref 0.9–1.1)

## 2020-06-24 NOTE — Telephone Encounter (Signed)
Please recommend that she hold one dose of her warfarin, and retest her INR next week. Thank you.

## 2020-06-24 NOTE — Progress Notes (Signed)
Metrics: Intervention Frequency ACO  Documented Smoking Status Yearly  Screened one or more times in 24 months  Cessation Counseling or  Active cessation medication Past 24 months  Past 24 months   Guideline developer: UpToDate (See UpToDate for funding source) Date Released: 2014       Wellness Office Visit  Subjective:  Patient ID: Katelyn [name], female    DOB: 1940/03/27  Age: 80 y.o. MRN: [id number]  CC: Abnormal chest x-ray HPI This lady comes in to discuss a very abnormal chest x-ray.  She appears to have a 4.8 cm suprahilar mass suggestive of malignancy. She had been a longtime smoker of almost 50 years and quit smoking 10 years ago.  More recently, she has had a cough and has been treated appropriately by Judson Roch for a presumed pneumonia and this is why Judson Roch obtained a chest x-ray with the subsequent results. She says that she is somewhat better from her respiratory symptoms although not completely recovered.  She feels very weak.  Past Medical History:  Diagnosis Date  . Allergy    rhinitis  . Aortic stenosis, severe   . Aortic stenosis, severe   . Arthritis    spine and various joints  . Cataract   . Cerebrovascular disease 12/05/2018  . Childhood asthma   . Chronic bronchitis ([place])       . Claudication ([place])   . Coronary artery disease    a. April 2012 which revealed mid RCA occlusion with collaterals, 50% LAD stenosis, 30% circumflex stenosis, and 40% marginal stenosis  . GERD (gastroesophageal reflux disease)   . Heart murmur   . Hyperlipidemia   . Hypertension   . LUMBAR RADICULOPATHY, RIGHT 12/21/2007   Qualifier: Diagnosis of  By: Aline Brochure MD, Dorothyann Peng    . LUMBAR RADICULOPATHY, RIGHT   . Peripheral vascular disease ([place])   . 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 Seroma, postoperative    Left Groin  . Severe aortic valve stenosis 08/31/2014   . Stroke [facility]) 1982 X 2   "a little bit weaker on the left side since" (08/29/2014)   Past Surgical History:  Procedure Laterality Date  . AORTO-FEMORAL BYPASS GRAFT Bilateral 06/27/10  . BASAL CELL CARCINOMA EXCISION Left    face  . CARDIAC CATHETERIZATION N/A 07/25/2014   Procedure: Right/Left Heart Cath and Coronary Angiography;  Surgeon: Troy Sine, MD;  Location: [place] CV [facility];  Service: Cardiovascular;  Laterality: N/A;  . CATARACT EXTRACTION, BILATERAL Bilateral 09/29/2016  . COLONOSCOPY  2008   Dr. Laural Golden: normal.   . DILATION AND CURETTAGE OF UTERUS  "2 or 3"  . ESOPHAGOGASTRODUODENOSCOPY N/A 08/18/2017   Dr. Gala Romney: Normal-appearing esophagus status post dilation.  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  Small hiatal hernia.  Marland Kitchen MALONEY DILATION N/A 08/18/2017   Procedure: Venia Minks DILATION;  Surgeon: Daneil Dolin, MD;  [facility];  Service: Endoscopy;  Laterality: N/A;  . MULTIPLE TOOTH EXTRACTIONS    . PR VEIN BYPASS GRAFT,AORTO-FEM-POP  06/12/10  . TEE WITHOUT CARDIOVERSION N/A 08/31/2014   Procedure: TRANSESOPHAGEAL ECHOCARDIOGRAM (TEE);  Surgeon: Rexene Alberts, MD;  Location: [place];  Service: Open Heart Surgery;  Laterality: N/A;  . TRANSCATHETER AORTIC VALVE REPLACEMENT, TRANSFEMORAL N/A 08/31/2014   Procedure: TRANSCATHETER AORTIC VALVE REPLACEMENT, TRANSFEMORAL approach;  Surgeon: Rexene Alberts, MD;  Location: [place];  Service: Open Heart Surgery;  Laterality:  N/A;  . TUBAL LIGATION  1970's  . VAGINAL HYSTERECTOMY  1970's   Partial      Family History  Problem Relation Age of Onset  . Heart disease Mother 48       died young  . Varicose Veins Mother   . Stroke Father   . Hypertension Father   . Heart disease Father 25       Aneurysm  . Heart attack Father   . Arthritis Brother   . Stroke Brother   . Diabetes Daughter   . Arthritis Daughter   . Allergic rhinitis Neg Hx   . Angioedema Neg Hx   . Asthma Neg Hx   . Atopy Neg Hx   .  Eczema Neg Hx   . Immunodeficiency Neg Hx   . Urticaria Neg Hx     Social History   Social History Narrative   [place] husband in November 2020. Retired,used to work at [employer].Lives alone.         Patient has one daughter, 3 grands   Likes to crochet   Works in [employer].    Social History   Tobacco Use  . Smoking status: Former Smoker    Packs/day: 2.00    Years: 56.00    Pack years: 112.00    Types: Cigarettes    Start date: 03/02/1952    Quit date: 06/01/2010    Years since quitting: 10.0  . Smokeless tobacco: Never Used  . Tobacco comment: HAD WEANED DOWN TO 1/4 PK A DAY BEFORE D/C  Substance Use Topics  . Alcohol use: No    Alcohol/week: 0.0 standard drinks    Comment: 08/29/2014 "never drank much; quit in the 1990's"    Current Meds  Medication Sig  . amiodarone (PACERONE) 200 MG tablet Take 1 tablet by mouth once daily  . Cholecalciferol (VITAMIN D3) 50 MCG (2000 UT) capsule Take 1 capsule by mouth daily.   Marland Kitchen diltiazem (CARDIZEM CD) 120 MG 24 hr capsule Take 1 capsule (120 mg total) by mouth daily.  Marland Kitchen doxycycline (VIBRA-TABS) 100 MG tablet Take 1 tablet (100 mg total) by mouth 2 (two) times daily.  Marland Kitchen estradiol (ESTRACE) 2 MG tablet Take 1 tablet by mouth once daily  . fluconazole (DIFLUCAN) 150 MG tablet Take 150 mg by mouth once.  Marland Kitchen guaiFENesin-codeine (ROBITUSSIN AC) 100-10 MG/5ML syrup Take 5 mLs by mouth 3 (three) times daily as needed for cough.  . hydrALAZINE (APRESOLINE) 25 MG tablet Take 1 tablet (25 mg total) by mouth in the morning and at bedtime.  Marland Kitchen ipratropium (ATROVENT) 0.06 % nasal spray USE 1 SPRAY(S) IN EACH NOSTRIL EVERY 6 HOURS AS NEEDED FOR RUNNY NOSE  . irbesartan (AVAPRO) 300 MG tablet Take 1 tablet (300 mg total) by mouth daily.  . meloxicam (MOBIC) 15 MG tablet Take 1 tablet by mouth once daily  . metoprolol succinate (TOPROL XL) 25 MG 24 hr tablet Take 1 tablet (25 mg total) by mouth daily.  . nitroGLYCERIN (NITROSTAT) 0.4 MG SL  tablet Place 1 tablet (0.4 mg total) under the tongue every 5 (five) minutes as needed for chest pain.  . NP THYROID 90 MG tablet Take 1 tablet by mouth once daily  . pantoprazole (PROTONIX) 40 MG tablet TAKE 1 TABLET BY MOUTH ONCE DAILY BEFORE SUPPER  . rosuvastatin (CRESTOR) 20 MG tablet TAKE 1 TABLET BY MOUTH AT BEDTIME  . warfarin (COUMADIN) 2.5 MG tablet Take 1 tablet by mouth once daily  [place] Office Visit from 06/24/2020 in [facility]  PHQ-9 Total Score 0      Objective:   Today's Vitals: BP (!) 158/72   Pulse (!) 57   Temp (!) 97.5 F (36.4 C) (Temporal)   Ht 5\' 10"  (1.778 m)   Wt 179 lb 3.2 oz (81.3 kg)   SpO2 95%   BMI 25.71 kg/m  Vitals with BMI 06/24/2020 06/19/2020 05/20/2020  Height 5\' 10"  5\' 10"  5\' 10"   Weight 179 lbs 3 oz (No Data) (No Data)  BMI 92.33 - -  Systolic 007 (No Data) (No Data)  Diastolic 72 (No Data) (No Data)  Pulse 57 (No Data) (No Data)     Physical Exam  I did not examine her today.  Saturation on room air is adequate and there was no respiratory distress.     Assessment   1. Lung mass   2. Solitary pulmonary nodule       Tests ordered Orders Placed This Encounter  Procedures  . CT Chest W Contrast     Plan: 1. I will order a CT scan with contrast as recommended and depending on the result, I will refer her to the thoracic clinic in [facility] for further management.  I discussed with her the findings of the chest x-ray and showed her the chest x-ray and did tell her that there is a possibility that the mass seen on the chest x-ray is cancer.  She appreciated my candor.  She had no particular questions at the present time. 2. Continue to follow-up with Judson Roch in a couple of days for an annual Medicare wellness visit.   No orders of the defined types were placed in this encounter.   Doree Albee, MD

## 2020-06-24 NOTE — Telephone Encounter (Signed)
Received INR 3.1 for patient today. Abstracted in chart. Please advise.

## 2020-06-24 NOTE — Telephone Encounter (Signed)
Patient came into the office today for an appointment and I gave her the message. Patient verbalized an understanding.

## 2020-06-24 NOTE — Telephone Encounter (Signed)
Received a call from [place] from [facility] in regards to the xray findings for patient.   I discussed with Dr. Anastasio Champion and called patient to have her come into our office today at 4pm to discuss xray results. Patient verbalized an understanding and stated that Dr. Everette Rank found a place years ago before her heart surgery and wanted to let us know. Sending as Juluis Rainier.

## 2020-06-25 ENCOUNTER — Ambulatory Visit (INDEPENDENT_AMBULATORY_CARE_PROVIDER_SITE_OTHER): Payer: Self-pay | Admitting: Nurse Practitioner

## 2020-06-25 NOTE — Progress Notes (Signed)
Patient told to hold one dose of warfarin and recheck INR again next week.

## 2020-06-26 ENCOUNTER — Encounter (INDEPENDENT_AMBULATORY_CARE_PROVIDER_SITE_OTHER): Payer: Self-pay | Admitting: Nurse Practitioner

## 2020-06-26 ENCOUNTER — Other Ambulatory Visit: Payer: Self-pay

## 2020-06-26 ENCOUNTER — Ambulatory Visit (INDEPENDENT_AMBULATORY_CARE_PROVIDER_SITE_OTHER): Payer: Medicare Other | Admitting: Nurse Practitioner

## 2020-06-26 VITALS — BP 130/80 | HR 71 | Temp 96.9°F | Ht 69.0 in | Wt 175.4 lb

## 2020-06-26 DIAGNOSIS — Z0000 Encounter for general adult medical examination without abnormal findings: Secondary | ICD-10-CM | POA: Diagnosis not present

## 2020-06-26 DIAGNOSIS — I10 Essential (primary) hypertension: Secondary | ICD-10-CM | POA: Diagnosis not present

## 2020-06-26 DIAGNOSIS — E559 Vitamin D deficiency, unspecified: Secondary | ICD-10-CM | POA: Diagnosis not present

## 2020-06-26 DIAGNOSIS — N1832 Chronic kidney disease, stage 3b: Secondary | ICD-10-CM | POA: Diagnosis not present

## 2020-06-26 DIAGNOSIS — R7303 Prediabetes: Secondary | ICD-10-CM | POA: Diagnosis not present

## 2020-06-26 NOTE — Progress Notes (Signed)
Subjective:   Katelyn [name] is a 80 y.o. female who presents for Medicare Annual (Subsequent) preventive examination.  Review of Systems     Cardiac Risk Factors include: advanced age (>68men, >31 women);dyslipidemia;hypertension     Objective:    Today's Vitals   06/26/20 0956  BP: 130/80  Pulse: 71  Temp: (!) 96.9 F (36.1 C)  TempSrc: Temporal  SpO2: 98%  Weight: 175 lb 6.4 oz (79.6 kg)  Height: 5\' 9"  (1.753 m)   Body mass index is 25.9 kg/m.  Advanced Directives 06/26/2020 06/16/2019 02/18/2019 08/18/2017 03/16/2016 03/11/2015 08/29/2014  Does Patient Have a Medical Advance Directive? Yes Yes Yes No No No No  Type of Industrial/product designer of [employer] Power of Attorney - - - -  Does patient want to make changes to medical advance directive? - No - Patient declined - - - - -  Copy of [place] in Chart? No - copy requested No - copy requested - - - - -  Would patient like information on creating a medical advance directive? No - Patient declined No - Guardian declined - No - Patient declined - - No - patient declined information    Current Medications (verified) Outpatient Encounter Medications as of 06/26/2020  Medication Sig  . amiodarone (PACERONE) 200 MG tablet Take 1 tablet by mouth once daily  . Cholecalciferol (VITAMIN D3) 50 MCG (2000 UT) capsule Take 1 capsule by mouth daily.   Marland Kitchen diltiazem (CARDIZEM CD) 120 MG 24 hr capsule Take 1 capsule (120 mg total) by mouth daily.  Marland Kitchen doxycycline (VIBRA-TABS) 100 MG tablet Take 1 tablet (100 mg total) by mouth 2 (two) times daily.  Marland Kitchen estradiol (ESTRACE) 2 MG tablet Take 1 tablet by mouth once daily  . guaiFENesin-codeine (ROBITUSSIN AC) 100-10 MG/5ML syrup Take 5 mLs by mouth 3 (three) times daily as needed for cough.  . hydrALAZINE (APRESOLINE) 25 MG tablet Take 1 tablet (25 mg total) by mouth in the morning and at bedtime.  Marland Kitchen ipratropium (ATROVENT) 0.06 %  nasal spray USE 1 SPRAY(S) IN EACH NOSTRIL EVERY 6 HOURS AS NEEDED FOR RUNNY NOSE  . irbesartan (AVAPRO) 300 MG tablet Take 1 tablet (300 mg total) by mouth daily.  . meloxicam (MOBIC) 15 MG tablet Take 1 tablet by mouth once daily  . metoprolol succinate (TOPROL XL) 25 MG 24 hr tablet Take 1 tablet (25 mg total) by mouth daily.  . nitroGLYCERIN (NITROSTAT) 0.4 MG SL tablet Place 1 tablet (0.4 mg total) under the tongue every 5 (five) minutes as needed for chest pain.  . NP THYROID 90 MG tablet Take 1 tablet by mouth once daily  . pantoprazole (PROTONIX) 40 MG tablet TAKE 1 TABLET BY MOUTH ONCE DAILY BEFORE SUPPER  . rosuvastatin (CRESTOR) 20 MG tablet TAKE 1 TABLET BY MOUTH AT BEDTIME  . warfarin (COUMADIN) 2.5 MG tablet Take 1 tablet by mouth once daily  . chlorthalidone (HYGROTON) 25 MG tablet Take 0.5 tablets (12.5 mg total) by mouth daily.  . fluconazole (DIFLUCAN) 150 MG tablet Take 150 mg by mouth once. (Patient not taking: Reported on 06/26/2020)   No facility-administered encounter medications on file as of 06/26/2020.    Allergies (verified) Adhesive [tape], Latex, and Tetanus toxoids   History: Past Medical History:  Diagnosis Date  . Allergy    rhinitis  . Aortic stenosis, severe   . Aortic stenosis, severe   . Arthritis  spine and various joints  . Cataract   . Cerebrovascular disease 12/05/2018  . Childhood asthma   . Chronic bronchitis ([place])       . Claudication ([place])   . Coronary artery disease    a. April 2012 which revealed mid RCA occlusion with collaterals, 50% LAD stenosis, 30% circumflex stenosis, and 40% marginal stenosis  . GERD (gastroesophageal reflux disease)   . Heart murmur   . Hyperlipidemia   . Hypertension   . LUMBAR RADICULOPATHY, RIGHT 12/21/2007   Qualifier: Diagnosis of  By: Aline Brochure MD, Dorothyann Peng    . LUMBAR RADICULOPATHY, RIGHT   . Peripheral vascular disease ([place])   . 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 Seroma, postoperative    Left Groin  . Severe aortic valve stenosis 08/31/2014  . Stroke [facility]) 1982 X 2   "a little bit weaker on the left side since" (08/29/2014)   Past Surgical History:  Procedure Laterality Date  . AORTO-FEMORAL BYPASS GRAFT Bilateral 06/27/10  . BASAL CELL CARCINOMA EXCISION Left    face  . CARDIAC CATHETERIZATION N/A 07/25/2014   Procedure: Right/Left Heart Cath and Coronary Angiography;  Surgeon: Troy Sine, MD;  Location: [facility] [facility];  Service: Cardiovascular;  Laterality: N/A;  . CATARACT EXTRACTION, BILATERAL Bilateral 09/29/2016  . COLONOSCOPY  2008   Dr. Laural Golden: normal.   . DILATION AND CURETTAGE OF UTERUS  "2 or 3"  . ESOPHAGOGASTRODUODENOSCOPY N/A 08/18/2017   Dr. Gala Romney: Normal-appearing esophagus status post dilation.  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  Small hiatal hernia.  Marland Kitchen MALONEY DILATION N/A 08/18/2017   Procedure: Venia Minks DILATION;  Surgeon: Daneil Dolin, MD;  [facility];  Service: Endoscopy;  Laterality: N/A;  . MULTIPLE TOOTH EXTRACTIONS    . PR VEIN BYPASS GRAFT,AORTO-FEM-POP  06/12/10  . TEE WITHOUT CARDIOVERSION N/A 08/31/2014   Procedure: TRANSESOPHAGEAL ECHOCARDIOGRAM (TEE);  Surgeon: Rexene Alberts, MD;  Location: [place];  Service: Open Heart Surgery;  Laterality: N/A;  . TRANSCATHETER AORTIC VALVE REPLACEMENT, TRANSFEMORAL N/A 08/31/2014   Procedure: TRANSCATHETER AORTIC VALVE REPLACEMENT, TRANSFEMORAL approach;  Surgeon: Rexene Alberts, MD;  Location: [place];  Service: Open Heart Surgery;  Laterality: N/A;  . TUBAL LIGATION  1970's  . VAGINAL HYSTERECTOMY  1970's   Partial    Family History  Problem Relation Age of Onset  . Heart disease Mother 64       died young  . Varicose Veins Mother   . Stroke Father   . Hypertension Father   . Heart disease Father 35       Aneurysm  . Heart attack Father   . Arthritis  Brother   . Stroke Brother   . Diabetes Daughter   . Arthritis Daughter   . Allergic rhinitis Neg Hx   . Angioedema Neg Hx   . Asthma Neg Hx   . Atopy Neg Hx   . Eczema Neg Hx   . Immunodeficiency Neg Hx   . Urticaria Neg Hx    Social History   Socioeconomic History  . Marital status: Married    Spouse name: bobby  . Number of children: 1  . Years of education: Not on file  . Highest education level: High school graduate  Occupational History  . 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 Smoking status: Former Smoker  Packs/day: 2.00    Years: 56.00    Pack years: 112.00    Types: Cigarettes    Start date: 03/02/1952    Quit date: 06/01/2010    Years since quitting: 10.0  . Smokeless tobacco: Never Used  . Tobacco comment: HAD WEANED DOWN TO 1/4 PK A DAY BEFORE D/C  Vaping Use  . Vaping Use: Never used  Substance and Sexual Activity  . Alcohol use: No    Alcohol/week: 0.0 standard drinks    Comment: 08/29/2014 "never drank much; quit in the 1990's"  . Drug use: No  . Sexual activity: Yes    Partners: Male  Other Topics Concern  . Not on file  Social History Narrative   [place] husband in November 2020. Retired,used to work at [employer].Lives alone.         Patient has one daughter, 3 grands   Likes to crochet   Works in [employer].    Social Determinants of Health   Financial Resource Strain: Not on file  Food Insecurity: Not on file  Transportation Needs: Not on file  Physical Activity: Not on file  Stress: Not on file  Social Connections: Not on file    Tobacco Counseling Counseling given: Yes Comment: HAD WEANED DOWN TO 1/4 PK A DAY BEFORE D/C   Clinical Intake:  Pre-visit preparation completed: Yes  Pain : No/denies pain     BMI - recorded: 25.9 Nutritional Status: BMI 25 -29 Overweight Nutritional Risks: None Diabetes: No  How often do you need to have someone help you when you read instructions,  pamphlets, or other written materials from your doctor or pharmacy?: 1 - Never What is the last grade level you completed in school?: 12th grade  Diabetic? No  Interpreter Needed?: No  Information entered by :: Jeralyn Ruths, NP-C   Activities of Daily Living In your present state of health, do you have any difficulty performing the following activities: 06/26/2020  Hearing? N  Vision? N  Comment does use reading glasses as needed  Difficulty concentrating or making decisions? Y  Walking or climbing stairs? Y  Comment Difficulty going up  Dressing or bathing? N  Doing errands, shopping? N  Preparing Food and eating ? N  Using the Toilet? N  In the past six months, have you accidently leaked urine? Y  Do you have problems with loss of bowel control? N  Managing your Medications? N  Managing your Finances? N  Housekeeping or managing your Housekeeping? N  Some recent data might be hidden    Patient Care Team: Doree Albee, MD as PCP - General (Internal Medicine) Troy Sine, MD as PCP - Cardiology (Cardiology) Troy Sine, MD as Attending Physician (Cardiology) Gala Romney Cristopher Estimable, MD as Consulting Physician (Gastroenterology) Almyra Deforest, [place] as Physician Assistant (Cardiology)  Indicate any recent Medical Services you may have received from other than [facility] providers in the past year (date may be approximate).     Assessment:   This is a routine wellness examination for [place].  Hearing/Vision screen No exam data present  Dietary issues and exercise activities discussed: Current Exercise Habits: The patient does not participate in regular exercise at present, Exercise limited by: respiratory conditions(s);cardiac condition(s)  Goals    . Exercise 150 min/wk Moderate Activity      Depression Screen PHQ 2/9 Scores 06/26/2020 06/24/2020 06/16/2019 06/14/2019 10/13/2017 07/30/2017 03/10/2017  PHQ - 2 Score 0 0 0 0 0 0 0  PHQ- 9 Score  0 0 - - - - -  Exception  Documentation - - - Medical reason - - -    Fall Risk Fall Risk  06/26/2020 06/24/2020 06/16/2019 06/14/2019 10/13/2017  Falls in the past year? 0 0 0 0 No  Number falls in past yr: - - 0 0 -  Injury with Fall? - - 0 0 -  Risk for fall due to : - - No Fall Risks No Fall Risks -  Follow up - - Falls prevention discussed;Education provided Falls evaluation completed -    FALL RISK PREVENTION PERTAINING TO THE HOME:  Any stairs in or around the home? Yes  If so, are there any without handrails? Yes  - Kitchen steps without railing, but has wall Home free of loose throw rugs in walkways, pet beds, electrical cords, etc? Yes  Adequate lighting in your home to reduce risk of falls? Yes   ASSISTIVE DEVICES UTILIZED TO PREVENT FALLS:  Life alert? No  Use of a cane, walker or w/c? No  Grab bars in the bathroom? Yes  Shower chair or bench in shower? Yes  - shower stool Elevated toilet seat or a handicapped toilet? Yes   TIMED UP AND GO:  Was the test performed? Yes .  Length of time to ambulate 10 feet:11 sec.   Gait steady and fast without use of assistive device  Cognitive Function:     6CIT Screen 06/26/2020 06/16/2019  What Year? 0 points 0 points  What month? 0 points 0 points  What time? 0 points 0 points  Count back from 20 0 points 0 points  Months in reverse 0 points 0 points  Repeat phrase 0 points 2 points  Total Score 0 2    Immunizations Immunization History  Administered Date(s) Administered  . Fluad Quad(high Dose 65+) 12/05/2018, 12/14/2019  . Influenza-Unspecified 11/30/2016, 12/15/2017  . Moderna Sars-Covid-2 Vaccination 06/26/2019, 07/24/2019  . Pneumococcal Conjugate-13 07/30/2017  . Pneumococcal Polysaccharide-23 10/20/2017  . Zoster 03/03/2007    Tetanus shot contraindicated due to allergy   Flu Vaccine status: Up to date  Pneumococcal vaccine status: Up to date  Covid-19 vaccine status: Completed vaccines - due for booster  Qualifies for Shingles  Vaccine? Yes   Zostavax completed Yes   Shingrix Completed?: No.    Education has been provided regarding the importance of this vaccine. Patient has been advised to call insurance company to determine out of pocket expense if they have not yet received this vaccine. Advised may also receive vaccine at local pharmacy or Health Dept. Verbalized acceptance and understanding.  Screening Tests Health Maintenance  Topic Date Due  . COVID-19 Vaccine (3 - Booster for Moderna series) 01/24/2020  . INFLUENZA VACCINE  09/30/2020  . DEXA SCAN  Completed  . PNA vac Low Risk Adult  Completed  . HPV VACCINES  Aged Out  . TETANUS/TDAP  Discontinued  . Hepatitis C Screening  Discontinued    Health Maintenance  Health Maintenance Due  Topic Date Due  . COVID-19 Vaccine (3 - Booster for Moderna series) 01/24/2020    Colorectal cancer screening: No longer required.   Mammogram status: No longer required due to age. - Last mammogram completed at age 22 without signs of breast cancer  Per patient preference will hold off on ordering DEXA scan  Lung Cancer Screening: (Low Dose CT Chest recommended if Age 104-80 years, 30 pack-year currently smoking OR have quit w/in 15years.) does qualify.   Lung Cancer Screening Referral: N/A  Additional Screening:  Hepatitis C Screening: does qualify; Completed: not completed per patient preference  Vision Screening: Recommended annual ophthalmology exams for early detection of glaucoma and other disorders of the eye. Is the patient up to date with their annual eye exam?  No  Who is the provider or what is the name of the office in which the patient attends annual eye exams? Dr. Radford Pax If pt is not established with a provider, would they like to be referred to a provider to establish care? No .   Dental Screening: Recommended annual dental exams for proper oral hygiene  Community Resource Referral / Chronic Care Management: CRR required this visit?  No    CCM required this visit?  No      Plan:   Patient is due for COVID-19 booster, however she has been having an upper respiratory infection and is trying to wait to have booster administered until she recovers.  She has an allergy to the tetanus shot so we will not administer this.  She is up-to-date with other vaccines recommended for a female of her age.  She tells me she has had colon cancer screening in the past and was told everything looked okay.  She is not interested in repeating a colon cancer screening test at this time.  Technically she has aged out so I am agreeable to holding off on this.  She denies any blood in her stool.  Last mammogram was in 2020, she is officially aged out of recommendations to repeat mammogram.  I have offered to send her again if she would like to continue doing mammograms, however she would like to discontinue mammograms for now.  She does qualify for lung cancer screening as her pack year is approximately 134 and she quit smoking about 10 years ago.  She does have a CT scan of her chest already scheduled later this week as there was a mass noted on her x-ray which was ordered for further evaluation of her respiratory infection.  Thus I will not reorder CT scan today.  She is not interested in undergoing bone density scan at this time.  She is also not interested in undergoing hepatitis C screening at this time.  Per chart review I do see she has a history of prediabetes last A1c was collected about 6 years ago so we will check A1c today.  She also history of vitamin D deficiency and has been on a vitamin D supplement so we will check her vitamin D level as well as metabolic panel to check most recent creatinine level.  It has been a few years and she is been checked for anemia so we will also check her CBC today.  I have personally reviewed and noted the following in the patient's chart:   . Medical and social history . Use of alcohol, tobacco or illicit drugs   . Current medications and supplements . Functional ability and status . Nutritional status . Physical activity . Advanced directives . List of other physicians . Hospitalizations, surgeries, and ER visits in previous 12 months . Vitals . Screenings to include cognitive, depression, and falls . Referrals and appointments  In addition, I have reviewed and discussed with patient certain preventive protocols, quality metrics, and best practice recommendations. A written personalized care plan for preventive services as well as general preventive health recommendations were provided to patient.    Patient will follow-up in 3 months for office visit and again next year for annual wellness visit.  She can also follow-up sooner as needed.  Ailene Ards, NP   06/26/2020

## 2020-06-26 NOTE — Patient Instructions (Signed)
  Katelyn [name] , Thank you for taking time to come for your Medicare Wellness Visit. I appreciate your ongoing commitment to your health goals. Please review the following plan we discussed and let me know if I can assist you in the future.   These are the goals we discussed: Goals    . Exercise 150 min/wk Moderate Activity       This is a list of the screening recommended for you and due dates:  Health Maintenance  Topic Date Due  . COVID-19 Vaccine (3 - Booster for Moderna series) 01/24/2020  . Flu Shot  09/30/2020  . DEXA scan (bone density measurement)  Completed  . Pneumonia vaccines  Completed  . HPV Vaccine  Aged Out  . Tetanus Vaccine  Discontinued  .  Hepatitis C: One time screening is recommended by Center for Disease Control  (CDC) for  adults born from [birth year] through 1963.   Discontinued

## 2020-06-27 ENCOUNTER — Other Ambulatory Visit (INDEPENDENT_AMBULATORY_CARE_PROVIDER_SITE_OTHER): Payer: Self-pay | Admitting: Nurse Practitioner

## 2020-06-27 DIAGNOSIS — N1832 Chronic kidney disease, stage 3b: Secondary | ICD-10-CM

## 2020-06-27 DIAGNOSIS — D751 Secondary polycythemia: Secondary | ICD-10-CM

## 2020-06-27 LAB — COMPLETE METABOLIC PANEL WITH GFR
AG Ratio: 1.3 (calc) (ref 1.0–2.5)
ALT: 10 U/L (ref 6–29)
AST: 15 U/L (ref 10–35)
Albumin: 3.9 g/dL (ref 3.6–5.1)
Alkaline phosphatase (APISO): 83 U/L (ref 37–153)
BUN/Creatinine Ratio: 18 (calc) (ref 6–22)
BUN: 23 mg/dL (ref 7–25)
CO2: 26 mmol/L (ref 20–32)
Calcium: 9.2 mg/dL (ref 8.6–10.4)
Chloride: 104 mmol/L (ref 98–110)
Creat: 1.31 mg/dL — ABNORMAL HIGH (ref 0.60–0.93)
GFR, Est African American: 45 mL/min/{1.73_m2} — ABNORMAL LOW (ref >=60)
GFR, Est Non African American: 39 mL/min/{1.73_m2} — ABNORMAL LOW (ref >=60)
Globulin: 2.9 g/dL (calc) (ref 1.9–3.7)
Glucose, Bld: 114 mg/dL (ref 65–139)
Potassium: 4.9 mmol/L (ref 3.5–5.3)
Sodium: 139 mmol/L (ref 135–146)
Total Bilirubin: 0.9 mg/dL (ref 0.2–1.2)
Total Protein: 6.8 g/dL (ref 6.1–8.1)

## 2020-06-27 LAB — CBC WITH DIFFERENTIAL/PLATELET
Absolute Monocytes: 581 cells/uL (ref 200–950)
Basophils Absolute: 88 cells/uL (ref 0–200)
Basophils Relative: 1 %
Eosinophils Absolute: 326 cells/uL (ref 15–500)
Eosinophils Relative: 3.7 %
HCT: 47.4 % — ABNORMAL HIGH (ref 35.0–45.0)
Hemoglobin: 15.7 g/dL — ABNORMAL HIGH (ref 11.7–15.5)
Lymphs Abs: 1179 cells/uL (ref 850–3900)
MCH: 31.2 pg (ref 27.0–33.0)
MCHC: 33.1 g/dL (ref 32.0–36.0)
MCV: 94.2 fL (ref 80.0–100.0)
MPV: 11.4 fL (ref 7.5–12.5)
Monocytes Relative: 6.6 %
Neutro Abs: 6626 cells/uL (ref 1500–7800)
Neutrophils Relative %: 75.3 %
Platelets: 188 10*3/uL (ref 140–400)
RBC: 5.03 10*6/uL (ref 3.80–5.10)
RDW: 13.7 % (ref 11.0–15.0)
Total Lymphocyte: 13.4 %
WBC: 8.8 10*3/uL (ref 3.8–10.8)

## 2020-06-27 LAB — HEMOGLOBIN A1C
Hgb A1c MFr Bld: 5.7 % of total Hgb — ABNORMAL HIGH (ref <5.7)
Mean Plasma Glucose: 117 mg/dL
eAG (mmol/L): 6.5 mmol/L

## 2020-06-27 LAB — VITAMIN D 25 HYDROXY (VIT D DEFICIENCY, FRACTURES): Vit D, 25-Hydroxy: 31 ng/mL (ref 30–100)

## 2020-06-28 ENCOUNTER — Ambulatory Visit (HOSPITAL_COMMUNITY)
Admission: RE | Admit: 2020-06-28 | Discharge: 2020-06-28 | Disposition: A | Discharge status: Home / Self Care | Payer: Medicare Other | Source: Ambulatory Visit | Attending: Internal Medicine | Admitting: Internal Medicine

## 2020-06-28 DIAGNOSIS — R911 Solitary pulmonary nodule: Secondary | ICD-10-CM | POA: Diagnosis not present

## 2020-06-28 DIAGNOSIS — J984 Other disorders of lung: Secondary | ICD-10-CM | POA: Diagnosis not present

## 2020-06-28 DIAGNOSIS — I898 Other specified noninfective disorders of lymphatic vessels and lymph nodes: Secondary | ICD-10-CM | POA: Diagnosis not present

## 2020-06-28 DIAGNOSIS — J439 Emphysema, unspecified: Secondary | ICD-10-CM | POA: Diagnosis not present

## 2020-06-28 DIAGNOSIS — R918 Other nonspecific abnormal finding of lung field: Secondary | ICD-10-CM | POA: Insufficient documentation

## 2020-06-28 DIAGNOSIS — I2510 Atherosclerotic heart disease of native coronary artery without angina pectoris: Secondary | ICD-10-CM | POA: Diagnosis not present

## 2020-06-28 MED ORDER — IOHEXOL 300 MG/ML  SOLN
60.0000 mL | Freq: Once | INTRAMUSCULAR | Status: AC | PRN
  Administered 2020-06-28: 60 mL via INTRAVENOUS

## 2020-07-01 ENCOUNTER — Telehealth (INDEPENDENT_AMBULATORY_CARE_PROVIDER_SITE_OTHER): Payer: Self-pay

## 2020-07-01 ENCOUNTER — Other Ambulatory Visit (INDEPENDENT_AMBULATORY_CARE_PROVIDER_SITE_OTHER): Payer: Self-pay | Admitting: Internal Medicine

## 2020-07-01 DIAGNOSIS — R918 Other nonspecific abnormal finding of lung field: Secondary | ICD-10-CM

## 2020-07-01 LAB — POCT INR: INR: 3 (ref 2.0–3.0)

## 2020-07-01 NOTE — Telephone Encounter (Signed)
Okay, thank you. She just said she wanted you to go over what her results was on test. Ok glad it work out.

## 2020-07-01 NOTE — Telephone Encounter (Signed)
I have spoken with the patient and she apparently has an appointment on 07/11/2020.

## 2020-07-01 NOTE — Telephone Encounter (Signed)
Yes I am aware of the result.  I have communicated this result to the patient.  Thanks.

## 2020-07-01 NOTE — Telephone Encounter (Signed)
Olivia Mackie called from [facility] and wanted you to be aware of the CT scan done for patient.

## 2020-07-09 LAB — POCT INR: INR: 2.2 (ref 2–3)

## 2020-07-10 ENCOUNTER — Encounter (HOSPITAL_COMMUNITY): Payer: Self-pay

## 2020-07-10 ENCOUNTER — Telehealth (INDEPENDENT_AMBULATORY_CARE_PROVIDER_SITE_OTHER): Payer: Self-pay | Admitting: Nurse Practitioner

## 2020-07-10 ENCOUNTER — Other Ambulatory Visit: Payer: Self-pay

## 2020-07-10 ENCOUNTER — Ambulatory Visit (INDEPENDENT_AMBULATORY_CARE_PROVIDER_SITE_OTHER): Payer: Self-pay | Admitting: Nurse Practitioner

## 2020-07-10 NOTE — Telephone Encounter (Signed)
Called patient and gave her the message. Patient verbalized an understanding.

## 2020-07-10 NOTE — Telephone Encounter (Signed)
Please call this patient and let her know to continue her current dose of warfarin as prescribed. She should check her INR next week. Thank you.

## 2020-07-11 ENCOUNTER — Inpatient Hospital Stay (HOSPITAL_COMMUNITY): Payer: Medicare Other | Attending: Hematology | Admitting: Hematology

## 2020-07-11 DIAGNOSIS — Z87891 Personal history of nicotine dependence: Secondary | ICD-10-CM | POA: Diagnosis not present

## 2020-07-11 DIAGNOSIS — Z85828 Personal history of other malignant neoplasm of skin: Secondary | ICD-10-CM | POA: Diagnosis not present

## 2020-07-11 DIAGNOSIS — R918 Other nonspecific abnormal finding of lung field: Secondary | ICD-10-CM

## 2020-07-11 DIAGNOSIS — J439 Emphysema, unspecified: Secondary | ICD-10-CM | POA: Insufficient documentation

## 2020-07-11 NOTE — Progress Notes (Signed)
[facility] [address] [place] [address]   CLINIC:  Medical Oncology/Hematology  CONSULT NOTE  Patient Care Team: Doree Albee, MD as PCP - General (Internal Medicine) Troy Sine, MD as PCP - Cardiology (Cardiology) Troy Sine, MD as Attending Physician (Cardiology) Gala Romney Cristopher Estimable, MD as Consulting Physician (Gastroenterology) Almyra Deforest, [place] as Physician Assistant (Cardiology)  CHIEF COMPLAINTS/PURPOSE OF CONSULTATION:  Evaluation of mass of upper lobe right lung  HISTORY OF PRESENTING ILLNESS:  Katelyn [name] 80 y.o. female is here because of evaluation of mass in upper right lung, at the request of Dr. Hurshel Party.   Today she is accompanied by her daughter. A chest x-ray was taken previously because she had a constant but unproductive cough. She denies any chest pains or other new pains. She reports some weakness in her legs since a Covid infection in December 2020. Weakness occurs after walking. She currently lives alone, and is typically active. She denies any headaches or blurry vision since covid infection. She has had 2 strokes; she lost mobility in left side, but it has returned 90%.   Her nephew had rectal cancer, but denies history of cancer in direct family. She quit smoking in May 2012. She had smoked for 56 years, 2 ppd. 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 She denies chemical exposure. 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    MEDICAL HISTORY:  Past Medical History:  Diagnosis Date  . Allergy    rhinitis  . Aortic stenosis, severe   . Aortic stenosis, severe   . Arthritis    spine and various joints  . Cataract   . Cerebrovascular disease 12/05/2018  . Childhood asthma   . Chronic bronchitis ([place])       . Claudication ([place])   . Coronary artery disease    a. April 2012 which revealed mid RCA occlusion with collaterals, 50% LAD stenosis, 30% circumflex stenosis, and 40% marginal stenosis  . GERD  (gastroesophageal reflux disease)   . Heart murmur   . Hyperlipidemia   . Hypertension   . LUMBAR RADICULOPATHY, RIGHT 12/21/2007   Qualifier: Diagnosis of  By: Aline Brochure MD, Dorothyann Peng    . LUMBAR RADICULOPATHY, RIGHT   . Peripheral vascular disease ([place])   . 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 Seroma, postoperative    Left Groin  . Severe aortic valve stenosis 08/31/2014  . Stroke [facility]) 1982 X 2   "a little bit weaker on the left side since" (08/29/2014)    SURGICAL HISTORY: Past Surgical History:  Procedure Laterality Date  . AORTO-FEMORAL BYPASS GRAFT Bilateral 06/27/10  . BASAL CELL CARCINOMA EXCISION Left    face  . CARDIAC CATHETERIZATION N/A 07/25/2014   Procedure: Right/Left Heart Cath and Coronary Angiography;  Surgeon: Troy Sine, MD;  Location: [facility];  Service: Cardiovascular;  Laterality: N/A;  . CATARACT EXTRACTION, BILATERAL Bilateral 09/29/2016  . COLONOSCOPY  2008   Dr. Laural Golden: normal.   . DILATION AND CURETTAGE OF UTERUS  "2 or 3"  . ESOPHAGOGASTRODUODENOSCOPY N/A 08/18/2017   Dr. Gala Romney: Normal-appearing esophagus status post dilation.  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  Small hiatal hernia.  Marland Kitchen MALONEY DILATION N/A 08/18/2017   Procedure: Venia Minks DILATION;  Surgeon: Daneil Dolin, MD;  Location[facility];  Service: Endoscopy;  Laterality: N/A;  .  MULTIPLE TOOTH EXTRACTIONS    . PR VEIN BYPASS GRAFT,AORTO-FEM-POP  06/12/10  . TEE WITHOUT CARDIOVERSION N/A 08/31/2014   Procedure: TRANSESOPHAGEAL ECHOCARDIOGRAM (TEE);  Surgeon: Rexene Alberts, MD;  Location: [facility];  Service: Open Heart Surgery;  Laterality: N/A;  . TRANSCATHETER AORTIC VALVE REPLACEMENT, TRANSFEMORAL N/A 08/31/2014   Procedure: TRANSCATHETER AORTIC VALVE REPLACEMENT, TRANSFEMORAL approach;  Surgeon: Rexene Alberts, MD;  Location: [facility];  Service: Open Heart  Surgery;  Laterality: N/A;  . TUBAL LIGATION  1970's  . VAGINAL HYSTERECTOMY  1970's   Partial     SOCIAL HISTORY: Social History   Socioeconomic History  . Marital status: Widowed    Spouse name: bobby  . Number of children: 1  . Years of education: Not on file  . Highest education level: High school graduate  Occupational History  . 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 Smoking status: Former Smoker    Packs/day: 2.00    Years: 56.00    Pack years: 112.00    Types: Cigarettes    Start date: 03/02/1952    Quit date: 06/01/2010    Years since quitting: 10.1  . Smokeless tobacco: Never Used  . Tobacco comment: HAD WEANED DOWN TO 1/4 PK A DAY BEFORE D/C  Vaping Use  . Vaping Use: Never used  Substance and Sexual Activity  . Alcohol use: No    Alcohol/week: 0.0 standard drinks    Comment: 08/29/2014 "never drank much; quit in the 1990's"  . Drug use: No  . Sexual activity: Yes    Partners: Male  Other Topics Concern  . Not on file  Social History Narrative   [place] husband in November 2020. Retired,used to work at [employer].Lives alone.         Patient has one daughter, 3 grands   Likes to crochet   Works in [employer].    Social Determinants of Health   Financial Resource Strain: Not on file  Food Insecurity: Not on file  Transportation Needs: Not on file  Physical Activity: Not on file  Stress: Not on file  Social Connections: Not on file  Intimate Partner Violence: Not on file    FAMILY HISTORY: Family History  Problem Relation Age of Onset  . Heart disease Mother 37       died young  . Varicose Veins Mother   . Stroke Father   . Hypertension Father   . Heart disease Father 54       Aneurysm  . Heart attack Father   . Arthritis Brother   . Stroke Brother   . Diabetes Daughter   . Arthritis Daughter   . Heart attack Brother        unsure  . Allergic rhinitis Neg Hx   . Angioedema Neg Hx   . Asthma Neg Hx   .  Atopy Neg Hx   . Eczema Neg Hx   . Immunodeficiency Neg Hx   . Urticaria Neg Hx     ALLERGIES:  is allergic to adhesive [tape], latex, and tetanus toxoids.  MEDICATIONS:  Current Outpatient Medications  Medication Sig Dispense Refill  . amiodarone (PACERONE) 200 MG tablet Take 1 tablet by mouth once daily 90 tablet 3  . chlorthalidone (HYGROTON) 25 MG tablet Take 0.5 tablets (12.5 mg total) by mouth daily. (Patient taking differently: Take 12.5 mg by mouth as directed. Takes it on Monday and Friday) 45 tablet 3  . Cholecalciferol (VITAMIN D3)  50 MCG (2000 UT) capsule Take 1 capsule by mouth daily.     Marland Kitchen diltiazem (CARDIZEM CD) 120 MG 24 hr capsule Take 1 capsule (120 mg total) by mouth daily. 90 capsule 3  . estradiol (ESTRACE) 2 MG tablet Take 1 tablet by mouth once daily 90 tablet 0  . hydrALAZINE (APRESOLINE) 25 MG tablet Take 1 tablet (25 mg total) by mouth in the morning and at bedtime. 60 tablet 6  . ipratropium (ATROVENT) 0.06 % nasal spray USE 1 SPRAY(S) IN EACH NOSTRIL EVERY 6 HOURS AS NEEDED FOR RUNNY NOSE 15 mL 6  . irbesartan (AVAPRO) 300 MG tablet Take 1 tablet (300 mg total) by mouth daily. 90 tablet 3  . meloxicam (MOBIC) 15 MG tablet Take 1 tablet by mouth once daily (Patient taking differently: as needed.) 30 tablet 3  . metoprolol succinate (TOPROL XL) 25 MG 24 hr tablet Take 1 tablet (25 mg total) by mouth daily. 90 tablet 3  . nitroGLYCERIN (NITROSTAT) 0.4 MG SL tablet Place 1 tablet (0.4 mg total) under the tongue every 5 (five) minutes as needed for chest pain. 25 tablet 12  . NP THYROID 90 MG tablet Take 1 tablet by mouth once daily 90 tablet 0  . pantoprazole (PROTONIX) 40 MG tablet TAKE 1 TABLET BY MOUTH ONCE DAILY BEFORE SUPPER 90 tablet 3  . rosuvastatin (CRESTOR) 20 MG tablet TAKE 1 TABLET BY MOUTH AT BEDTIME 90 tablet 0  . warfarin (COUMADIN) 2.5 MG tablet Take 1 tablet by mouth once daily 90 tablet 0   No current facility-administered medications for this  visit.    REVIEW OF SYSTEMS:   Review of Systems  Constitutional: Negative for appetite change.  Cardiovascular: Negative for chest pain.  Neurological: Positive for extremity weakness (legs). Negative for headaches.  All other systems reviewed and are negative.    PHYSICAL EXAMINATION: ECOG PERFORMANCE STATUS: 1 - Symptomatic but completely ambulatory  Vitals:   07/11/20 1322  BP: (!) 170/63  Pulse: (!) 56  Resp: 20  Temp: (!) 96.8 F (36 C)  SpO2: 98%   Filed Weights   07/11/20 1322  Weight: 177 lb 11.1 oz (80.6 kg)   Physical Exam Vitals reviewed.  Constitutional:      Appearance: Normal appearance.  Cardiovascular:     Rate and Rhythm: Normal rate and regular rhythm.     Pulses: Normal pulses.     Heart sounds: Normal heart sounds.  Pulmonary:     Effort: Pulmonary effort is normal.     Breath sounds: Normal breath sounds.  Chest:  Breasts:     Right: No axillary adenopathy.     Left: No axillary adenopathy.    Abdominal:     Palpations: Abdomen is soft. There is no hepatomegaly, splenomegaly or mass.     Tenderness: There is no abdominal tenderness.  Musculoskeletal:     Right lower leg: Edema (trace) present.     Left lower leg: Edema (trace) present.  Lymphadenopathy:     Upper Body:     Right upper body: No axillary or pectoral adenopathy.     Left upper body: No axillary or pectoral adenopathy.  Neurological:     General: No focal deficit present.     Mental Status: She is alert and oriented to person, place, and time.  Psychiatric:        Mood and Affect: Mood normal.        Behavior: Behavior normal.      LABORATORY DATA:  I have reviewed the data as listed CBC Latest Ref Rng & Units 06/26/2020 02/18/2019 05/17/2018  WBC 3.8 - 10.8 Thousand/uL 8.8 2.9(L) 7.5  Hemoglobin 11.7 - 15.5 g/dL 15.7(H) 16.8(H) 15.4  Hematocrit 35.0 - 45.0 % 47.4(H) 52.0(H) 47.0(H)  Platelets 140 - 400 Thousand/uL 188 99(L) 163   CMP Latest Ref Rng & Units  06/26/2020 03/20/2020 03/08/2020  Glucose 65 - 139 mg/dL 114 96 110(H)  BUN 7 - 25 mg/dL 23 31(H) 38(H)  Creatinine 0.60 - 0.93 mg/dL 1.31(H) 1.49(H) 1.70(H)  Sodium 135 - 146 mmol/L 139 137 139  Potassium 3.5 - 5.3 mmol/L 4.9 5.0 4.5  Chloride 98 - 110 mmol/L 104 101 102  CO2 20 - 32 mmol/L 26 26 25   Calcium 8.6 - 10.4 mg/dL 9.2 8.9 8.7  Total Protein 6.1 - 8.1 g/dL 6.8 - -  Total Bilirubin 0.2 - 1.2 mg/dL 0.9 - -  Alkaline Phos 38 - 126 U/L - - -  AST 10 - 35 U/L 15 - -  ALT 6 - 29 U/L 10 - -    RADIOGRAPHIC STUDIES: I have personally reviewed the radiological images as listed and agreed with the findings in the report. DG Chest 2 View  Result Date: 06/22/2020 CLINICAL DATA:  Cough EXAM: CHEST - 2 VIEW COMPARISON:  None. FINDINGS: Cardiomediastinal silhouette and pulmonary vasculature are within normal limits. Prosthetic aortic valve is noted. 4.8 cm masslike opacity seen in the right suprahilar lung. Lungs are otherwise clear. IMPRESSION: 1. No acute cardiopulmonary process. 2. Masslike opacity in the right suprahilar lung measuring 4.8 cm is highly suspicious for malignancy. Further evaluation with contrast enhanced CT is recommended. These results will be called to the ordering clinician or representative by the Radiologist Assistant, and communication documented in the PACS or [employer]. Electronically Signed   By: Miachel Roux M.D.   On: 06/22/2020 13:35   CT Chest W Contrast  Result Date: 07/01/2020 CLINICAL DATA:  Abnormal xray - lung nodule, >= 1 cm Masslike opacity on radiograph. Patient reports mild shortness of breath and cough for 1 month. EXAM: CT CHEST WITH CONTRAST TECHNIQUE: Multidetector CT imaging of the chest was performed during intravenous contrast administration. CONTRAST:  20mL OMNIPAQUE IOHEXOL 300 MG/ML  SOLN COMPARISON:  Chest radiograph 06/21/2020. Chest CT 08/08/2014 reviewed. FINDINGS: Cardiovascular: Moderate aortic atherosclerosis. No aortic aneurysm. TAVR.  Left vertebral artery arises directly from the thoracic aorta, variant arch anatomy. Normal heart size. Coronary artery calcifications. No pericardial effusion or thickening. Mediastinum/Nodes: Calcified mediastinal and right hilar lymph nodes consistent with prior granulomatous disease. Small noncalcified mediastinal nodes are all subcentimeter, for example 8 mm right lower paratracheal node, series 2, image 55. Anterior paratracheal node measures 7 mm, series 2, image 44. No supraclavicular or axillary adenopathy. No bulky right hilar adenopathy. There is no left hilar adenopathy. No thyroid nodule. No esophageal wall thickening. Lungs/Pleura: Right upper lobe pulmonary mass measures 4.0 x 3.2 x 3.8 cm, series 4, image 47. Margins are lobulated and irregular in the superior portion with surrounding spiculation and ground-glass opacity extending peripherally. There is occlusion of segmental bronchi in the right upper lobe. Scattered internal calcifications. No necrosis. Scattered subpleural nodules in the right upper lobe include 5 mm anteriorly, series 4, image 35, 5 mm posteriorly series 4, image 38 and linear 7 mm opacity series 4, image 69. Tiny subpleural nodule in the superior segment of the right lower lobe measuring 3 mm, series 4, image 48. Nonspecific perifissural ground-glass in the right  middle lobe, series 4, image 66, measures approximately 11 mm. Four discrete clustered lingular nodules measuring up to 6 mm, series 4, image 81-84. Calcified nodules in the anterior right upper lobe and lingula consistent with prior granulomatous disease. Mild underlying emphysema. Biapical pleuroparenchymal scarring which is similar to prior. No pleural fluid. Upper Abdomen: No adrenal nodule. 8 mm enhancing focus in the left lobe of the liver is nonspecific, series 2, image 171. No upper abdominal adenopathy. No abdominal ascites. Musculoskeletal: No destructive lytic lesion. Small sclerotic focus in the right  humeral head with seen on prior exam and likely represents a bone island. Mild sclerotic cortical thickening of the left anterior first rib is also seen on prior, unchanged, and likely benign. IMPRESSION: 1. Right upper lobe pulmonary mass measuring 4.0 x 3.2 x 3.8 cm, highly suspicious for primary bronchogenic malignancy. There is surrounding spiculation and ground-glass opacity extending peripherally. Recommend malignancy workup. 2. Small noncalcified mediastinal nodes are all subcentimeter, nonspecific. 3. Small subpleural nodules in the right upper lobe measure up to 5 mm. Clustered lingular nodules measuring up to 6 mm are nonspecific. There is also a nonspecific ground-glass opacity within the right middle lobe. 4. Sequela of prior granulomatous disease with calcified nodules, mediastinal and right hilar lymph nodes. 5. Nonspecific 8 mm enhancing focus in the left lobe of the liver. Attention on staging exam is recommended. Aortic Atherosclerosis (ICD10-I70.0) and Emphysema (ICD10-J43.9). These results will be called to the ordering clinician or representative by the Radiologist Assistant, and communication documented in the PACS or [employer]. Electronically Signed   By: Keith Rake M.D.   On: 07/01/2020 11:40    ASSESSMENT:  1.  Right upper lobe lung mass: - Presentation with cough for the last 1 and half month. - She reported COVID infection in December 2020 and has been fatigued since then. - Chest x-ray on 06/21/2020 showed masslike opacity in the right lung. - CT chest with contrast on 06/28/2020 showed right upper lobe lung mass measuring 4 x 3.2 x 3.8 cm.  Small noncalcified mediastinal lymph nodes are all subcentimeter, nonspecific.  Small subpleural nodules in the right upper lobe measure up to 5 mm.  Clustered lingular nodules measuring up to 6 mm nonspecific.  Nonspecific 8 mm enhancing focus in the left lobe of the liver.  2.  Social/family history: - She lives by herself at  home and is independent of ADLs and IADLs. - She smoked 2 packs/day for 56 years and quit in May 2012. - She worked in [employer]. - Her nephew had colon cancer.  No other malignancies in the family.   PLAN:  1.  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 - We discussed the differential diagnosis including malignancy in detail. - She denies any weight loss, chest pains, headaches or blurring of vision. - Recommend PET CT scan and MRI of the brain with and without contrast for further staging. - She is seeing Dr. [name] on 07/18/2020. - We will see her back after the scans to discuss staging and further plan. - If there is no metastatic disease, will consider PFTs.  2.  Elevated hemoglobin: - Her latest CBC on 06/26/2020 shows hemoglobin 15.7 and hematocrit 47 (35-45).  RBC count was normal. - Mildly elevated hemoglobin and hematocrit likely from plasma volume contraction from chlorthalidone.  We will closely monitor.  If there is any worsening, will consider JAK2 V617F mutation  testing and erythropoietin levels.    All questions were answered. The patient knows to call the clinic with any problems, questions or concerns.   Derek Jack, MD, 07/11/20 5:58 PM  [place] [phone]   I, Thana Ates, am acting as a scribe for Dr. Sanda Linger.  I, Derek Jack MD, have reviewed the above documentation for accuracy and completeness, and I agree with the above.

## 2020-07-11 NOTE — Patient Instructions (Addendum)
[place] at [facility] Discharge Instructions  You were seen today by Dr. Delton Coombes. He went over your recent results. You were seen today for a mass in the right upper lund. You will be scheduled for a PET scan and an MRI of your brain. Keep your appointment with your pulmonologist. Dr. Delton Coombes will see you back after the results of your scans for follow up.   Thank you for choosing [place] at [facility] to provide your oncology and hematology care.  To afford each patient quality time with our provider, please arrive at least 15 minutes before your scheduled appointment time.   If you have a lab appointment with the [facility] please come in thru the Main Entrance and check in at the main information desk  You need to re-schedule your appointment should you arrive 10 or more minutes late.  We strive to give you quality time with our providers, and arriving late affects you and other patients whose appointments are after yours.  Also, if you no show three or more times for appointments you may be dismissed from the clinic at the providers discretion.     Again, thank you for choosing [facility].  Our hope is that these requests will decrease the amount of time that you wait before being seen by our physicians.       _____________________________________________________________  Should you have questions after your visit to [facility], please contact our office at [phone] between the hours of 8:00 a.m. and 4:30 p.m.  Voicemails left after 4:00 p.m. will not be returned until the following business day.  For prescription refill requests, have your pharmacy contact our office and allow 72 hours.    Cancer Center Support Programs:   > Cancer Support Group  2nd Tuesday of the month 1pm-2pm, Journey Room

## 2020-07-12 ENCOUNTER — Other Ambulatory Visit (INDEPENDENT_AMBULATORY_CARE_PROVIDER_SITE_OTHER): Payer: Self-pay | Admitting: Internal Medicine

## 2020-07-12 DIAGNOSIS — Z79890 Hormone replacement therapy (postmenopausal): Secondary | ICD-10-CM

## 2020-07-15 ENCOUNTER — Encounter: Payer: Self-pay | Admitting: Nurse Practitioner

## 2020-07-15 DIAGNOSIS — Z7901 Long term (current) use of anticoagulants: Secondary | ICD-10-CM | POA: Diagnosis not present

## 2020-07-15 LAB — POCT INR: INR: 2 (ref 2.0–3.0)

## 2020-07-16 ENCOUNTER — Other Ambulatory Visit: Payer: Self-pay | Admitting: Gastroenterology

## 2020-07-18 ENCOUNTER — Ambulatory Visit (INDEPENDENT_AMBULATORY_CARE_PROVIDER_SITE_OTHER): Payer: Medicare Other | Admitting: Internal Medicine

## 2020-07-18 ENCOUNTER — Other Ambulatory Visit: Payer: Self-pay

## 2020-07-18 ENCOUNTER — Encounter: Payer: Self-pay | Admitting: Internal Medicine

## 2020-07-18 VITALS — BP 158/70 | HR 51 | Temp 97.5°F | Ht 69.0 in | Wt 179.4 lb

## 2020-07-18 DIAGNOSIS — R918 Other nonspecific abnormal finding of lung field: Secondary | ICD-10-CM | POA: Diagnosis not present

## 2020-07-18 DIAGNOSIS — I701 Atherosclerosis of renal artery: Secondary | ICD-10-CM

## 2020-07-18 DIAGNOSIS — J449 Chronic obstructive pulmonary disease, unspecified: Secondary | ICD-10-CM

## 2020-07-18 HISTORY — DX: Chronic obstructive pulmonary disease, unspecified: J44.9

## 2020-07-18 NOTE — Assessment & Plan Note (Signed)
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

## 2020-07-18 NOTE — Assessment & Plan Note (Signed)
PFT's  07/18/2020  FEV1 1.88 (66 % ) ratio 0.61  p 9 % improvement from saba p 0 prior to study with DLCO  18.45 (57%) corrects to 3.86 (71%)  for alv volume and FV curve classically mildly concave   She is mild/moderate copd s limiting doe or tendency to aecopd and should be a good candidate for RULobectomy if surgically resectable.  Discussed in detail all the  indications, usual  risks and alternatives  relative to the benefits with patient who agrees to proceed with w/u as outlined.            Each maintenance medication was reviewed in detail including emphasizing most importantly the difference between maintenance and prns and under what circumstances the prns are to be triggered using an action plan format where appropriate.  Total time for H and P, chart review, counseling,   and generating customized AVS unique to this office visit / same day charting = 50 min

## 2020-07-18 NOTE — Patient Instructions (Signed)
We will go ahead and set you up for next available PFTs and I will call you with the results   If your PET scan just shows one hot spot and your lung function looks good, I would recommend direct referral to a thoracic surgeon in [place].

## 2020-07-18 NOTE — Progress Notes (Signed)
Katelyn [name], female    DOB: 04-27-1940,   MRN: [id number]   Brief patient profile:  80 yowf quit smoking 2012 s/p TAVR 2016 and recovered with nl baseline then covid Dec 2021 not previously vaccinated > mainly had  HA but since then more sob / cough > greyish mucus  better with doxycylcline but still coughing so cxr done 4/22/2 c/w RUL mass confirmed on CT chest 06/28/20  And PET scheduled for Katelyn [name] 26th and referred to pulmonary clinic in [place]  07/18/2020 by Dr  Katelyn [name]      History of Present Illness  07/18/2020  Pulmonary/ 1st office eval/ Katelyn [name] / [name]  Chief Complaint  Patient presents with  . Consult    Non productive cough since March 2022   Dyspnea:  Really not limited from desired activities/ housework Cough: min productive/ mucoid never bloody  Sleep: ok flat  SABA use: none  No obvious day to day or daytime variability or assoc excess/ purulent sputum or mucus plugs or hemoptysis or cp or chest tightness, subjective wheeze or overt sinus or hb symptoms.   Sleeping ok now  without nocturnal  or early am exacerbation  of respiratory  c/o's or need for noct saba. Also denies any obvious fluctuation of symptoms with weather or environmental changes or other aggravating or alleviating factors except as outlined above   No unusual exposure hx or h/o childhood pna/ asthma or knowledge of premature birth.  Current Allergies, Complete Past Medical History, Past Surgical History, Family History, and Social History were reviewed in [employer] record.  ROS  The following are not active complaints unless bolded Hoarseness, sore throat, dysphagia, dental problems, itching, sneezing,  nasal congestion or discharge of excess mucus or purulent secretions, ear ache,   fever, chills, sweats, unintended wt loss or wt gain, classically pleuritic or exertional cp,  orthopnea pnd or arm/hand swelling  or leg swelling, presyncope, palpitations, abdominal  pain, anorexia, nausea, vomiting, diarrhea  or change in bowel habits or change in bladder habits, change in stools or change in urine, dysuria, hematuria,  rash, arthralgias, visual complaints, headache, numbness, weakness or ataxia or problems with walking or coordination,  change in mood or  memory.           Past Medical History:  Diagnosis Date  . Allergy    rhinitis  . Aortic stenosis, severe   . Aortic stenosis, severe   . Arthritis    spine and various joints  . Cataract   . Cerebrovascular disease 12/05/2018  . Childhood asthma   . Chronic bronchitis ([place])       . Claudication ([place])   . Coronary artery disease    a. April 2012 which revealed mid RCA occlusion with collaterals, 50% LAD stenosis, 30% circumflex stenosis, and 40% marginal stenosis  . GERD (gastroesophageal reflux disease)   . Heart murmur   . Hyperlipidemia   . Hypertension   . LUMBAR RADICULOPATHY, RIGHT 12/21/2007   Qualifier: Diagnosis of  By: Aline Brochure MD, Dorothyann Peng    . LUMBAR RADICULOPATHY, RIGHT   . Peripheral vascular disease ([place])   . 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 Seroma, postoperative    Left Groin  . Severe aortic valve stenosis 08/31/2014  . Stroke [name] 1982 X 2   "a little bit weaker on the left side since" (08/29/2014)  Outpatient Medications Prior to Visit  Medication Sig Dispense Refill  . amiodarone (PACERONE) 200 MG tablet Take 1 tablet by mouth once daily 90 tablet 3  . chlorthalidone (HYGROTON) 25 MG tablet Take 0.5 tablets (12.5 mg total) by mouth daily. (Patient taking differently: Take 12.5 mg by mouth as directed. Takes it on Monday and Friday) 45 tablet 3  . Cholecalciferol (VITAMIN D3) 50 MCG (2000 UT) capsule Take 1 capsule by mouth daily.     Marland Kitchen diltiazem (CARDIZEM CD) 120 MG 24 hr capsule Take 1 capsule (120 mg total) by mouth daily. 90 capsule 3  .  estradiol (ESTRACE) 2 MG tablet Take 1 tablet by mouth once daily 90 tablet 0  . ipratropium (ATROVENT) 0.06 % nasal spray USE 1 SPRAY(S) IN EACH NOSTRIL EVERY 6 HOURS AS NEEDED FOR RUNNY NOSE 15 mL 6  . irbesartan (AVAPRO) 300 MG tablet Take 1 tablet (300 mg total) by mouth daily. 90 tablet 3  . meloxicam (MOBIC) 15 MG tablet Take 1 tablet by mouth once daily (Patient taking differently: as needed.) 30 tablet 3  . metoprolol succinate (TOPROL XL) 25 MG 24 hr tablet Take 1 tablet (25 mg total) by mouth daily. 90 tablet 3  . nitroGLYCERIN (NITROSTAT) 0.4 MG SL tablet Place 1 tablet (0.4 mg total) under the tongue every 5 (five) minutes as needed for chest pain. 25 tablet 12  . NP THYROID 90 MG tablet Take 1 tablet by mouth once daily 90 tablet 0  . pantoprazole (PROTONIX) 40 MG tablet TAKE 1 TABLET BY MOUTH ONCE DAILY BEFORE SUPPER 90 tablet 3  . rosuvastatin (CRESTOR) 20 MG tablet TAKE 1 TABLET BY MOUTH AT BEDTIME 90 tablet 0  . warfarin (COUMADIN) 2.5 MG tablet Take 1 tablet by mouth once daily 90 tablet 0  . hydrALAZINE (APRESOLINE) 25 MG tablet Take 1 tablet (25 mg total) by mouth in the morning and at bedtime. 60 tablet 6   No facility-administered medications prior to visit.     Objective:     BP (!) 158/70 (BP Location: Left Arm, Cuff Size: Normal)   Pulse (!) 51   Temp (!) 97.5 F (36.4 C) (Temporal)   Ht 5\' 9"  (1.753 m)   Wt 179 lb 6.4 oz (81.4 kg)   SpO2 97% Comment: Room air  BMI 26.49 kg/m   SpO2: 97 % (Room air)  amb somber wf nad   HEENT : pt wearing mask not removed for exam due to covid -19 concerns.    NECK :  without JVD/Nodes/TM/ nl carotid upstrokes bilaterally   LUNGS: no acc muscle use,  Nl contour chest which is clear to A and P bilaterally without cough on insp or exp maneuvers   CV:  RRR  no s3 or murmur or increase in P2, and  Trace L > R pitting edema  ABD:  soft and nontender with nl inspiratory excursion in the supine position. No bruits or  organomegaly appreciated, bowel sounds nl  MS:  Nl gait/ ext warm without deformities, calf tenderness, cyanosis or clubbing No obvious joint restrictions   SKIN: warm and dry without lesions    NEURO:  alert, approp, nl sensorium with  no motor or cerebellar deficits apparent.   I personally reviewed images and agree with radiology impression as follows:   Chest CT 06/28/20 1. Right upper lobe pulmonary mass measuring 4.0 x 3.2 x 3.8 cm, highly suspicious for primary bronchogenic malignancy. There is surrounding spiculation and ground-glass opacity extending  peripherally. Recommend malignancy workup. 2. Small noncalcified mediastinal nodes are all subcentimeter, nonspecific. 3. Small subpleural nodules in the right upper lobe measure up to 5 mm. Clustered lingular nodules measuring up to 6 mm are nonspecific. There is also a nonspecific ground-glass opacity within the right middle lobe. 4. Sequela of prior granulomatous disease with calcified nodules, mediastinal and right hilar lymph nodes. 5. Nonspecific 8 mm enhancing focus in the left lobe of the liver. Attention on staging exam is recommended.    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  Discussed in detail all the  indications, usual  risks and alternatives  relative to the benefits with  patient who agrees to proceed with w/u as outlined.         Each maintenance medication was reviewed in detail including emphasizing most importantly the difference between maintenance and prns and under what circumstances the prns are to be triggered using an action plan format where appropriate.  Total time for H and P, chart review, counseling,   and generating customized AVS unique to this office visit / same day charting = 50 min       Christinia Gully, MD 07/18/2020

## 2020-07-22 ENCOUNTER — Telehealth (INDEPENDENT_AMBULATORY_CARE_PROVIDER_SITE_OTHER): Payer: Self-pay

## 2020-07-22 LAB — POCT INR: INR: 2.6 (ref 2.0–3.0)

## 2020-07-22 NOTE — Telephone Encounter (Addendum)
Great, please call the patient and let her know that her INR is at goal. She should continue her current dose of warfarin and recheck INR again next week. Please let me know if she has nay questions.

## 2020-07-22 NOTE — Telephone Encounter (Signed)
Pt called given INR regimen to stay same. No changes at this time. Also to continue the metformin as well as directed.

## 2020-07-22 NOTE — Telephone Encounter (Signed)
My apologies I meant to state to continue just her warfarin NOT metformin. I have addended previous note, it appears you recommended she needs to continue her warfarin at current dose which is the appropriate recommendation. Thank you.

## 2020-07-23 ENCOUNTER — Other Ambulatory Visit: Payer: Self-pay

## 2020-07-23 ENCOUNTER — Ambulatory Visit (HOSPITAL_COMMUNITY)
Admission: RE | Admit: 2020-07-23 | Discharge: 2020-07-23 | Disposition: A | Discharge status: Home / Self Care | Payer: Medicare Other | Source: Ambulatory Visit | Attending: Hematology | Admitting: Hematology

## 2020-07-23 DIAGNOSIS — K8020 Calculus of gallbladder without cholecystitis without obstruction: Secondary | ICD-10-CM | POA: Diagnosis not present

## 2020-07-23 DIAGNOSIS — R918 Other nonspecific abnormal finding of lung field: Secondary | ICD-10-CM

## 2020-07-23 DIAGNOSIS — J439 Emphysema, unspecified: Secondary | ICD-10-CM | POA: Insufficient documentation

## 2020-07-23 DIAGNOSIS — I700 Atherosclerosis of aorta: Secondary | ICD-10-CM | POA: Diagnosis not present

## 2020-07-23 DIAGNOSIS — R911 Solitary pulmonary nodule: Secondary | ICD-10-CM | POA: Diagnosis not present

## 2020-07-23 DIAGNOSIS — R591 Generalized enlarged lymph nodes: Secondary | ICD-10-CM | POA: Insufficient documentation

## 2020-07-23 LAB — GLUCOSE, CAPILLARY: Glucose-Capillary: 100 mg/dL — ABNORMAL HIGH (ref 70–99)

## 2020-07-23 MED ORDER — FLUDEOXYGLUCOSE F - 18 (FDG) INJECTION
9.0000 | Freq: Once | INTRAVENOUS | Status: AC | PRN
  Administered 2020-07-23: 8.97 via INTRAVENOUS

## 2020-07-26 ENCOUNTER — Other Ambulatory Visit: Payer: Self-pay

## 2020-07-26 ENCOUNTER — Ambulatory Visit (HOSPITAL_COMMUNITY)
Admission: RE | Admit: 2020-07-26 | Discharge: 2020-07-26 | Disposition: A | Discharge status: Home / Self Care | Payer: Medicare Other | Source: Ambulatory Visit | Attending: Hematology | Admitting: Hematology

## 2020-07-26 DIAGNOSIS — R918 Other nonspecific abnormal finding of lung field: Secondary | ICD-10-CM | POA: Diagnosis not present

## 2020-07-26 DIAGNOSIS — I6389 Other cerebral infarction: Secondary | ICD-10-CM | POA: Diagnosis not present

## 2020-07-26 DIAGNOSIS — I6782 Cerebral ischemia: Secondary | ICD-10-CM | POA: Diagnosis not present

## 2020-07-26 MED ORDER — GADOBUTROL 1 MMOL/ML IV SOLN
7.0000 mL | Freq: Once | INTRAVENOUS | Status: AC | PRN
  Administered 2020-07-26: 7 mL via INTRAVENOUS

## 2020-07-29 LAB — POCT INR: INR: 3.7 — AB (ref 2.0–3.0)

## 2020-07-30 ENCOUNTER — Telehealth (INDEPENDENT_AMBULATORY_CARE_PROVIDER_SITE_OTHER): Payer: Self-pay

## 2020-07-30 NOTE — Telephone Encounter (Signed)
I recommend that she hold the warfarin for 2 days starting today and then restart at the same dose and then repeat the blood test per schedule

## 2020-07-30 NOTE — Progress Notes (Signed)
[name],  [address]   CLINIC:  Medical Oncology/Hematology  PCP:  Doree Albee, MD [place] / [place] [address] [phone]   REASON FOR VISIT:  Follow-up for mass of upper lobe right lung  PRIOR THERAPY: none  NGS Results: not done  CURRENT THERAPY: under work-up  BRIEF ONCOLOGIC HISTORY:  Oncology History   No history exists.    CANCER STAGING: Cancer Staging No matching staging information was found for the patient.  INTERVAL HISTORY:  Katelyn [name], a 80 y.o. female, returns for routine follow-up of her mass of upper lobe right lung. Esparanza was last seen on 07/11/2020.   Today she reports feeling well. She expressed a preference to avoid chemotherapy. She has a history of high BP. She reports that she has a cough tat has not produced any sputum. She is not currently using any inhalers.   REVIEW OF SYSTEMS:  Review of Systems  Constitutional: Positive for fatigue (60%). Negative for appetite change.  Respiratory: Positive for cough (dry).   All other systems reviewed and are negative.   PAST MEDICAL/SURGICAL HISTORY:  Past Medical History:  Diagnosis Date  . Allergy    rhinitis  . Aortic stenosis, severe   . Aortic stenosis, severe   . Arthritis    spine and various joints  . Cataract   . Cerebrovascular disease 12/05/2018  . Childhood asthma   . Chronic bronchitis ([place])       . Claudication ([place])   . COPD GOLD II 07/18/2020  . Coronary artery disease    a. April 2012 which revealed mid RCA occlusion with collaterals, 50% LAD stenosis, 30% circumflex stenosis, and 40% marginal stenosis  . GERD (gastroesophageal reflux disease)   . Heart murmur   . Hyperlipidemia   . Hypertension   . LUMBAR RADICULOPATHY, RIGHT 12/21/2007   Qualifier: Diagnosis of  By: Aline Brochure MD, Dorothyann Peng    . LUMBAR RADICULOPATHY, RIGHT   . Peripheral vascular disease ([place])   . 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 Seroma, postoperative    Left Groin  . Severe aortic valve stenosis 08/31/2014  . Stroke [facility]) 1982 X 2   "a little bit weaker on the left side since" (08/29/2014)   Past Surgical History:  Procedure Laterality Date  . AORTO-FEMORAL BYPASS GRAFT Bilateral 06/27/10  . BASAL CELL CARCINOMA EXCISION Left    face  . CARDIAC CATHETERIZATION N/A 07/25/2014   Procedure: Right/Left Heart Cath and Coronary Angiography;  Surgeon: Troy Sine, MD;  Location: [facility];  Service: Cardiovascular;  Laterality: N/A;  . CATARACT EXTRACTION, BILATERAL Bilateral 09/29/2016  . COLONOSCOPY  2008   Dr. Laural Golden: normal.   . DILATION AND CURETTAGE OF UTERUS  "2 or 3"  . ESOPHAGOGASTRODUODENOSCOPY N/A 08/18/2017   Dr. Gala Romney: Normal-appearing esophagus status post dilation.  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  Small hiatal hernia.  Marland Kitchen MALONEY DILATION N/A 08/18/2017   Procedure: Venia Minks DILATION;  Surgeon: Daneil Dolin, MD;  Location[facility];  Service: Endoscopy;  Laterality: N/A;  . MULTIPLE TOOTH EXTRACTIONS    . PR VEIN BYPASS GRAFT,AORTO-FEM-POP  06/12/10  . TEE WITHOUT CARDIOVERSION N/A 08/31/2014   Procedure: TRANSESOPHAGEAL ECHOCARDIOGRAM (TEE);  Surgeon: Rexene Alberts, MD;  Location: [facility];  Service: Open Heart Surgery;  Laterality: N/A;  .  TRANSCATHETER AORTIC VALVE REPLACEMENT, TRANSFEMORAL N/A 08/31/2014   Procedure: TRANSCATHETER AORTIC VALVE REPLACEMENT, TRANSFEMORAL approach;  Surgeon: Rexene Alberts, MD;  Location: [facility];  Service: Open Heart Surgery;  Laterality: N/A;  . TUBAL LIGATION  1970's  . VAGINAL HYSTERECTOMY  1970's   Partial     SOCIAL HISTORY:  Social History   Socioeconomic History  . Marital status: Widowed    Spouse name: Katelyn [name]  . Number of children: 1  . Years of education: Not on file  . Highest education level: High school  graduate  Occupational History  . 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 Smoking status: Former Smoker    Packs/day: 2.00    Years: 56.00    Pack years: 112.00    Types: Cigarettes    Start date: 03/02/1952    Quit date: 06/01/2010    Years since quitting: 10.1  . Smokeless tobacco: Never Used  Vaping Use  . Vaping Use: Never used  Substance and Sexual Activity  . Alcohol use: No    Alcohol/week: 0.0 standard drinks    Comment: 08/29/2014 "never drank much; quit in the 1990's"  . Drug use: No  . Sexual activity: Yes    Partners: Male  Other Topics Concern  . Not on file  Social History Narrative   [place] husband in November 2020. Retired,used to work at [employer].Lives alone.         Patient has one daughter, 3 grands   Likes to crochet   Works in [employer].    Social Determinants of Health   Financial Resource Strain: Not on file  Food Insecurity: Not on file  Transportation Needs: Not on file  Physical Activity: Not on file  Stress: Not on file  Social Connections: Not on file  Intimate Partner Violence: Not on file    FAMILY HISTORY:  Family History  Problem Relation Age of Onset  . Heart disease Mother 33       died young  . Varicose Veins Mother   . Stroke Father   . Hypertension Father   . Heart disease Father 1       Aneurysm  . Heart attack Father   . Arthritis Brother   . Stroke Brother   . Diabetes Daughter   . Arthritis Daughter   . Heart attack Brother        unsure  . Allergic rhinitis Neg Hx   . Angioedema Neg Hx   . Asthma Neg Hx   . Atopy Neg Hx   . Eczema Neg Hx   . Immunodeficiency Neg Hx   . Urticaria Neg Hx     CURRENT MEDICATIONS:  Current Outpatient Medications  Medication Sig Dispense Refill  . amiodarone (PACERONE) 200 MG tablet Take 1 tablet by mouth once daily 90 tablet 3  . chlorthalidone (HYGROTON) 25 MG tablet Take 0.5 tablets (12.5 mg total) by mouth daily. (Patient taking  differently: Take 12.5 mg by mouth as directed. Takes it on Monday and Friday) 45 tablet 3  . Cholecalciferol (VITAMIN D3) 50 MCG (2000 UT) capsule Take 1 capsule by mouth daily.     Marland Kitchen diltiazem (CARDIZEM CD) 120 MG 24 hr capsule Take 1 capsule (120 mg total) by mouth daily. 90 capsule 3  . estradiol (ESTRACE) 2 MG tablet Take 1 tablet by mouth once daily 90 tablet 0  . hydrALAZINE (APRESOLINE) 25 MG tablet Take 1 tablet (25 mg total) by mouth in  the morning and at bedtime. 60 tablet 6  . ipratropium (ATROVENT) 0.06 % nasal spray USE 1 SPRAY(S) IN EACH NOSTRIL EVERY 6 HOURS AS NEEDED FOR RUNNY NOSE 15 mL 6  . irbesartan (AVAPRO) 300 MG tablet Take 1 tablet (300 mg total) by mouth daily. 90 tablet 3  . meloxicam (MOBIC) 15 MG tablet Take 1 tablet by mouth once daily (Patient taking differently: as needed.) 30 tablet 3  . metoprolol succinate (TOPROL XL) 25 MG 24 hr tablet Take 1 tablet (25 mg total) by mouth daily. 90 tablet 3  . nitroGLYCERIN (NITROSTAT) 0.4 MG SL tablet Place 1 tablet (0.4 mg total) under the tongue every 5 (five) minutes as needed for chest pain. 25 tablet 12  . NP THYROID 90 MG tablet Take 1 tablet by mouth once daily 90 tablet 0  . pantoprazole (PROTONIX) 40 MG tablet TAKE 1 TABLET BY MOUTH ONCE DAILY BEFORE SUPPER 90 tablet 3  . rosuvastatin (CRESTOR) 20 MG tablet TAKE 1 TABLET BY MOUTH AT BEDTIME 90 tablet 0  . warfarin (COUMADIN) 2.5 MG tablet Take 1 tablet by mouth once daily 90 tablet 0   No current facility-administered medications for this visit.    ALLERGIES:  Allergies  Allergen Reactions  . Adhesive [Tape] Rash  . Latex Rash  . Tetanus Toxoids Rash    PHYSICAL EXAM:  Performance status (ECOG): 1 - Symptomatic but completely ambulatory  There were no vitals filed for this visit. Wt Readings from Last 3 Encounters:  07/18/20 179 lb 6.4 oz (81.4 kg)  07/11/20 177 lb 11.1 oz (80.6 kg)  06/26/20 175 lb 6.4 oz (79.6 kg)   Physical Exam Vitals reviewed.   Constitutional:      Appearance: Normal appearance.  Cardiovascular:     Rate and Rhythm: Normal rate and regular rhythm.     Pulses: Normal pulses.     Heart sounds: Normal heart sounds.  Pulmonary:     Effort: Pulmonary effort is normal.     Breath sounds: Normal breath sounds.  Neurological:     General: No focal deficit present.     Mental Status: She is alert and oriented to person, place, and time.  Psychiatric:        Mood and Affect: Mood normal.        Behavior: Behavior normal.      LABORATORY DATA:  I have reviewed the labs as listed.  CBC Latest Ref Rng & Units 06/26/2020 02/18/2019 05/17/2018  WBC 3.8 - 10.8 Thousand/uL 8.8 2.9(L) 7.5  Hemoglobin 11.7 - 15.5 g/dL 15.7(H) 16.8(H) 15.4  Hematocrit 35.0 - 45.0 % 47.4(H) 52.0(H) 47.0(H)  Platelets 140 - 400 Thousand/uL 188 99(L) 163   CMP Latest Ref Rng & Units 06/26/2020 03/20/2020 03/08/2020  Glucose 65 - 139 mg/dL 114 96 110(H)  BUN 7 - 25 mg/dL 23 31(H) 38(H)  Creatinine 0.60 - 0.93 mg/dL 1.31(H) 1.49(H) 1.70(H)  Sodium 135 - 146 mmol/L 139 137 139  Potassium 3.5 - 5.3 mmol/L 4.9 5.0 4.5  Chloride 98 - 110 mmol/L 104 101 102  CO2 20 - 32 mmol/L 26 26 25   Calcium 8.6 - 10.4 mg/dL 9.2 8.9 8.7  Total Protein 6.1 - 8.1 g/dL 6.8 - -  Total Bilirubin 0.2 - 1.2 mg/dL 0.9 - -  Alkaline Phos 38 - 126 U/L - - -  AST 10 - 35 U/L 15 - -  ALT 6 - 29 U/L 10 - -    DIAGNOSTIC IMAGING:  I have  independently reviewed the scans and discussed with the patient. MR Brain W Wo Contrast  Result Date: 07/27/2020 CLINICAL DATA:  Lung mass EXAM: MRI HEAD WITHOUT AND WITH CONTRAST TECHNIQUE: Multiplanar, multiecho pulse sequences of the brain and surrounding structures were obtained without and with intravenous contrast. CONTRAST:  67mL GADAVIST GADOBUTROL 1 MMOL/ML IV SOLN COMPARISON:  None. FINDINGS: Brain: There is no acute infarction or intracranial hemorrhage. There is no intracranial mass, mass effect, or edema. There is no  hydrocephalus or extra-axial fluid collection. Prominence of the ventricles and sulci reflects generalized parenchymal volume loss. Patchy T2 hyperintensity in the supratentorial white matter is nonspecific but mild chronic microvascular ischemic changes. Chronic cortical infarct of the posterior insula and adjacent posterior operculum. Chronic infarcts of bilateral basal ganglia, left thalamus, and bilateral cerebellum. No abnormal enhancement. Vascular: Major vessel flow voids at the skull base are preserved. Skull and upper cervical spine: Normal marrow signal is preserved. Sinuses/Orbits: Paranasal sinuses are aerated. Bilateral lens replacement. Other: Sella is unremarkable.  Mastoid air cells are clear. IMPRESSION: No evidence of intracranial metastatic disease. Chronic microvascular ischemic changes with small infarcts. Electronically Signed   By: Macy Mis M.D.   On: 07/27/2020 07:34   NM PET Image Initial (PI) Skull Base To Thigh  Result Date: 07/24/2020 CLINICAL DATA:  Initial treatment strategy for lung nodule. EXAM: NUCLEAR MEDICINE PET SKULL BASE TO THIGH TECHNIQUE: 9.0 mCi F-18 FDG was injected intravenously. Full-ring PET imaging was performed from the skull base to thigh after the radiotracer. CT data was obtained and used for attenuation correction and anatomic localization. Fasting blood glucose: 100 mg/dl COMPARISON:  Chest CT 06/28/2020 FINDINGS: Mediastinal blood pool activity: SUV max 3.0 Liver activity: SUV max NA NECK: No hypermetabolic lymph nodes in the neck. Incidental CT findings: none CHEST: Right upper lobe pulmonary mass measuring 4.3 cm maximum dimension today is markedly hypermetabolic with SUV max = 33.2. Calcified nodal tissue is seen in the mediastinum and right hilum with low level hypermetabolism in the right hilum demonstrating SUV max = 3.5. Focus of hypermetabolism in the precarinal space is in the region of densely calcified nodal tissue with SUV max = 4.1.  Incidental CT findings: Status post AVR. Centrilobular emphsyema noted. 7 mm right middle lobe nodule shows only low level FDG uptake with SUV max = 1.15, below blood pool levels. Several additional scattered tiny pulmonary nodules are evident, below threshold for resolution on PET imaging. No evidence for pleural effusion. ABDOMEN/PELVIS: No abnormal hypermetabolic activity within the liver, pancreas, adrenal glands, or spleen. No hypermetabolic lymph nodes in the abdomen or pelvis. Incidental CT findings: Tiny calcified gallstones evident. Status post aorto bi femoral bypass grafting. Tiny fluid collection in the left groin region is not hypermetabolic and likely represents a small seroma or chronic hematoma. SKELETON: No focal hypermetabolic activity to suggest skeletal metastasis. Incidental CT findings: none IMPRESSION: 1. Right upper lobe pulmonary mass is markedly hypermetabolic, consistent with primary bronchogenic neoplasm. 2. Low level hypermetabolism identified in the right hilum and precarinal station. There are multiple calcified lymph nodes in these regions suggesting prior granulomatous disease and no overt noncalcified lymphadenopathy evident on CT. Uptake may be related to chronic granulomatous involvement although metastatic lymphadenopathy not entirely excluded. 3. Several additional scattered tiny pulmonary nodules show no hypermetabolism, but are below threshold for resolution on PET imaging. 4. Cholelithiasis. 5.  Aortic Atherosclerois (ICD10-170.0) 6.  Emphysema. (RJJ88-C16.9) Electronically Signed   By: Misty Stanley M.D.   On: 07/24/2020 15:55  ASSESSMENT:  1.  Right upper lobe lung mass: - Presentation with cough for the last 1 and half month. - She reported COVID infection in December 2020 and has been fatigued since then. - Chest x-ray on 06/21/2020 showed masslike opacity in the right lung. - CT chest with contrast on 06/28/2020 showed right upper lobe lung mass measuring 4 x  3.2 x 3.8 cm.  Small noncalcified mediastinal lymph nodes are all subcentimeter, nonspecific.  Small subpleural nodules in the right upper lobe measure up to 5 mm.  Clustered lingular nodules measuring up to 6 mm nonspecific.  Nonspecific 8 mm enhancing focus in the left lobe of the liver.  2.  Social/family history: - She lives by herself at home and is independent of ADLs and IADLs. - She smoked 2 packs/day for 56 years and quit in May 2012. - She worked in [employer]. - Her nephew had colon cancer.  No other malignancies in the family.   PLAN:  1.  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 - We reviewed images of the PET scan from 07/23/2020 which showed right upper lobe pulmonary mass measuring 4.3 cm with SUV 18.8.  Calcified nodal tissue in the mediastinum and right hilum with low-level hypermetabolism in the right hilum with SUV 3.5 and precarinal space SUV 4.1.  Several additional scattered lung nodules with no hypermetabolism. - She is scheduled for PFTs by Dr. [name].  She is not scheduled for any bronchoscopy or biopsy.  We will need bronchoscopy for tissue diagnosis along with mediastinal and hilar lymph node sampling. - I have recommended surgical evaluation by Dr. Roxan Hockey. - She prefers not to have chemotherapy at this time.  We will see her back after she sees Dr. Roxan Hockey.  2.  Elevated hemoglobin: - Last CBC on 06/26/2020 shows mildly elevated hemoglobin and hematocrit of 15.7 and 47.4.  RBC count was normal. - If it continues to be high, will consider JAK2 V617F testing.   Orders placed this encounter:  No orders of the defined types were placed in this encounter.    Derek Jack, MD [place] [phone]   I, Thana Ates, am acting as a scribe for Dr. Derek Jack.  I,  Derek Jack MD, have reviewed the above documentation for accuracy and completeness, and I agree with the above.

## 2020-07-31 ENCOUNTER — Other Ambulatory Visit: Payer: Self-pay

## 2020-07-31 ENCOUNTER — Inpatient Hospital Stay (HOSPITAL_COMMUNITY): Payer: Medicare Other | Attending: Hematology | Admitting: Hematology

## 2020-07-31 VITALS — BP 165/56 | HR 56 | Temp 97.0°F | Resp 18 | Wt 179.4 lb

## 2020-07-31 DIAGNOSIS — I739 Peripheral vascular disease, unspecified: Secondary | ICD-10-CM | POA: Diagnosis not present

## 2020-07-31 DIAGNOSIS — Z87891 Personal history of nicotine dependence: Secondary | ICD-10-CM | POA: Insufficient documentation

## 2020-07-31 DIAGNOSIS — K219 Gastro-esophageal reflux disease without esophagitis: Secondary | ICD-10-CM | POA: Diagnosis not present

## 2020-07-31 DIAGNOSIS — E785 Hyperlipidemia, unspecified: Secondary | ICD-10-CM | POA: Diagnosis not present

## 2020-07-31 DIAGNOSIS — I350 Nonrheumatic aortic (valve) stenosis: Secondary | ICD-10-CM | POA: Insufficient documentation

## 2020-07-31 DIAGNOSIS — I10 Essential (primary) hypertension: Secondary | ICD-10-CM | POA: Insufficient documentation

## 2020-07-31 DIAGNOSIS — R911 Solitary pulmonary nodule: Secondary | ICD-10-CM | POA: Diagnosis not present

## 2020-07-31 DIAGNOSIS — J449 Chronic obstructive pulmonary disease, unspecified: Secondary | ICD-10-CM | POA: Diagnosis not present

## 2020-07-31 DIAGNOSIS — Z8673 Personal history of transient ischemic attack (TIA), and cerebral infarction without residual deficits: Secondary | ICD-10-CM | POA: Insufficient documentation

## 2020-07-31 DIAGNOSIS — Z7901 Long term (current) use of anticoagulants: Secondary | ICD-10-CM | POA: Diagnosis not present

## 2020-07-31 DIAGNOSIS — Z79899 Other long term (current) drug therapy: Secondary | ICD-10-CM | POA: Diagnosis not present

## 2020-07-31 DIAGNOSIS — R918 Other nonspecific abnormal finding of lung field: Secondary | ICD-10-CM | POA: Diagnosis not present

## 2020-07-31 NOTE — Patient Instructions (Signed)
[place] at [facility] Discharge Instructions  You were seen and examined today by Dr. Delton Coombes. Dr. Delton Coombes reviewed your recent PET scan and MRI.  The PET scan did reveal that there is cancer present in your lung with questionable lymph node involvement. Dr. Delton Coombes has recommended a referral to Dr. Roxan Hockey for biopsy and evaluation for surgical intervention. Ideally, you will be a candidate for surgery to completely cure your cancer.  Dr. Delton Coombes will see you back following the biopsy.   Thank you for choosing [place] at [facility] to provide your oncology and hematology care.  To afford each patient quality time with our provider, please arrive at least 15 minutes before your scheduled appointment time.   If you have a lab appointment with the [facility] please come in thru the Main Entrance and check in at the main information desk.  You need to re-schedule your appointment should you arrive 10 or more minutes late.  We strive to give you quality time with our providers, and arriving late affects you and other patients whose appointments are after yours.  Also, if you no show three or more times for appointments you may be dismissed from the clinic at the providers discretion.     Again, thank you for choosing [facility].  Our hope is that these requests will decrease the amount of time that you wait before being seen by our physicians.       _____________________________________________________________  Should you have questions after your visit to [facility], please contact our office at [phone] and follow the prompts.  Our office hours are 8:00 a.m. and 4:30 p.m. Monday - Friday.  Please note that voicemails left after 4:00 p.m. may not be returned until the following business day.  We are closed weekends and major holidays.  You do have access to a nurse 24-7, just call the main number  to the clinic [phone] and do not press any options, hold on the line and a nurse will answer the phone.    For prescription refill requests, have your pharmacy contact our office and allow 72 hours.    Due to Covid, you will need to wear a mask upon entering the hospital. If you do not have a mask, a mask will be given to you at the Main Entrance upon arrival. For doctor visits, patients may have 1 support person age 50 or older with them. For treatment visits, patients can not have anyone with them due to social distancing guidelines and our immunocompromised population.

## 2020-08-01 ENCOUNTER — Ambulatory Visit (HOSPITAL_COMMUNITY)
Admission: RE | Admit: 2020-08-01 | Discharge: 2020-08-01 | Disposition: A | Discharge status: Home / Self Care | Payer: Medicare Other | Source: Ambulatory Visit | Attending: Cardiology | Admitting: Cardiology

## 2020-08-01 ENCOUNTER — Other Ambulatory Visit (HOSPITAL_COMMUNITY): Payer: Self-pay | Admitting: Cardiovascular Disease

## 2020-08-01 DIAGNOSIS — I6523 Occlusion and stenosis of bilateral carotid arteries: Secondary | ICD-10-CM

## 2020-08-02 ENCOUNTER — Other Ambulatory Visit (HOSPITAL_COMMUNITY)
Admission: RE | Admit: 2020-08-02 | Discharge: 2020-08-02 | Disposition: A | Discharge status: Home / Self Care | Payer: Medicare Other | Source: Ambulatory Visit | Attending: Internal Medicine | Admitting: Internal Medicine

## 2020-08-02 DIAGNOSIS — Z01812 Encounter for preprocedural laboratory examination: Secondary | ICD-10-CM | POA: Diagnosis not present

## 2020-08-02 DIAGNOSIS — Z20822 Contact with and (suspected) exposure to covid-19: Secondary | ICD-10-CM | POA: Diagnosis not present

## 2020-08-02 LAB — SARS CORONAVIRUS 2 (TAT 6-24 HRS): SARS Coronavirus 2: NEGATIVE

## 2020-08-05 ENCOUNTER — Ambulatory Visit (INDEPENDENT_AMBULATORY_CARE_PROVIDER_SITE_OTHER): Payer: Medicare Other | Admitting: Internal Medicine

## 2020-08-05 ENCOUNTER — Institutional Professional Consult (permissible substitution) (INDEPENDENT_AMBULATORY_CARE_PROVIDER_SITE_OTHER): Payer: Medicare Other | Admitting: Thoracic Surgery (Cardiothoracic Vascular Surgery)

## 2020-08-05 ENCOUNTER — Other Ambulatory Visit: Payer: Self-pay

## 2020-08-05 ENCOUNTER — Other Ambulatory Visit: Payer: Self-pay | Admitting: *Deleted

## 2020-08-05 ENCOUNTER — Encounter: Payer: Self-pay | Admitting: *Deleted

## 2020-08-05 ENCOUNTER — Encounter: Payer: Self-pay | Admitting: Thoracic Surgery (Cardiothoracic Vascular Surgery)

## 2020-08-05 VITALS — BP 121/63 | HR 90 | Resp 20 | Ht 69.0 in | Wt 176.0 lb

## 2020-08-05 DIAGNOSIS — R918 Other nonspecific abnormal finding of lung field: Secondary | ICD-10-CM

## 2020-08-05 DIAGNOSIS — I701 Atherosclerosis of renal artery: Secondary | ICD-10-CM | POA: Diagnosis not present

## 2020-08-05 LAB — PULMONARY FUNCTION TEST
DL/VA % pred: 73 %
DL/VA: 2.87 ml/min/mmHg/L
DLCO cor % pred: 55 %
DLCO cor: 12.87 ml/min/mmHg
DLCO unc % pred: 59 %
DLCO unc: 13.7 ml/min/mmHg
FEF 25-75 Post: 1.43 L/sec
FEF 25-75 Pre: 0.93 L/sec
FEF2575-%Change-Post: 53 %
FEF2575-%Pred-Post: 74 %
FEF2575-%Pred-Pre: 48 %
FEV1-%Change-Post: 14 %
FEV1-%Pred-Post: 67 %
FEV1-%Pred-Pre: 59 %
FEV1-Post: 1.79 L
FEV1-Pre: 1.57 L
FEV1FVC-%Change-Post: 0 %
FEV1FVC-%Pred-Pre: 90 %
FEV6-%Change-Post: 15 %
FEV6-%Pred-Post: 80 %
FEV6-%Pred-Pre: 69 %
FEV6-Post: 2.68 L
FEV6-Pre: 2.33 L
FEV6FVC-%Change-Post: 0 %
FEV6FVC-%Pred-Post: 104 %
FEV6FVC-%Pred-Pre: 104 %
FVC-%Change-Post: 15 %
FVC-%Pred-Post: 77 %
FVC-%Pred-Pre: 66 %
FVC-Post: 2.7 L
FVC-Pre: 2.34 L
Post FEV1/FVC ratio: 66 %
Post FEV6/FVC ratio: 99 %
Pre FEV1/FVC ratio: 67 %
Pre FEV6/FVC Ratio: 99 %
RV % pred: 92 %
RV: 2.43 L
TLC % pred: 79 %
TLC: 4.75 L

## 2020-08-05 NOTE — Progress Notes (Signed)
PFT done today. 

## 2020-08-05 NOTE — H&P (View-Only) (Signed)
PCP is Doree Albee, MD Referring Provider is Derek Jack, MD  Chief Complaint  Patient presents with  . Lung Lesion    Initial surgical consult, PFT 6/6, PET 5/24, CT chest 4/29     HPI: Katelyn [name] is sent for consultation regarding a right upper lobe lung mass.  Katelyn [name] is a 80 year old woman with a history of heavy tobacco abuse, gold 2 COPD, severe aortic stenosis, TAVR, severe thoracic aortic atherosclerotic disease, coronary artery disease, peripheral arterial disease, stroke, atrial fibrillation, chronic anticoagulation, hypertension, hyperlipidemia, arthritis, and reflux.  This spring she developed a cough.  This was associated with a cold initially but while the other symptoms resolved, her cough never did.  She finally went to the doctor a chest x-ray showed a right upper lobe opacity.  A CT of the chest showed a 4 cm right upper lobe lung mass.  There were some calcified hilar and mediastinal nodes that were not particularly enlarged.  There also were multiple other lung nodules bilaterally.  She had a PET/CT which showed the right upper lobe mass was markedly hypermetabolic.  There was some moderate increase in activity in the hilar and mediastinal lymph nodes.  She complains of decreased energy.  Her appetite is about normal.  She has lost 5 pounds over the past 3 months.  She gets short of breath with exertion but takes her dogs on walks.  She lives by herself and takes care of her own household.  She has a greater than 100-pack-year history of smoking prior to quitting 10 years ago.  Bruises easily from being on Coumadin.  Zubrod Score: At the time of surgery this patient's most appropriate activity status/level should be described as: []     0    Normal activity, no symptoms [x]     1    Restricted in physical strenuous activity but ambulatory, able to do out light work []     2    Ambulatory and capable of self care, unable to do work activities, up and about >50 %  of waking hours                              []     3    Only limited self care, in bed greater than 50% of waking hours []     4    Completely disabled, no self care, confined to bed or chair []     5    Moribund  Past Medical History:  Diagnosis Date  . Allergy    rhinitis  . Aortic stenosis, severe   . Aortic stenosis, severe   . Arthritis    spine and various joints  . Cataract   . Cerebrovascular disease 12/05/2018  . Childhood asthma   . Chronic bronchitis ([place])       . Claudication ([place])   . COPD GOLD II 07/18/2020  . Coronary artery disease    a. April 2012 which revealed mid RCA occlusion with collaterals, 50% LAD stenosis, 30% circumflex stenosis, and 40% marginal stenosis  . GERD (gastroesophageal reflux disease)   . Heart murmur   . Hyperlipidemia   . Hypertension   . LUMBAR RADICULOPATHY, RIGHT 12/21/2007   Qualifier: Diagnosis of  By: Aline Brochure MD, Dorothyann Peng    . LUMBAR RADICULOPATHY, RIGHT   . Peripheral vascular disease ([place])   . 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 Seroma, postoperative    Left Groin  . Severe aortic valve stenosis 08/31/2014  . Stroke [facility]) 1982 X 2   "a little bit weaker on the left side since" (08/29/2014)    Past Surgical History:  Procedure Laterality Date  . AORTO-FEMORAL BYPASS GRAFT Bilateral 06/27/10  . BASAL CELL CARCINOMA EXCISION Left    face  . CARDIAC CATHETERIZATION N/A 07/25/2014   Procedure: Right/Left Heart Cath and Coronary Angiography;  Surgeon: Troy Sine, MD;  Location: [facility];  Service: Cardiovascular;  Laterality: N/A;  . CATARACT EXTRACTION, BILATERAL Bilateral 09/29/2016  . COLONOSCOPY  2008   Dr. Laural Golden: normal.   . DILATION AND CURETTAGE OF UTERUS  "2 or 3"  . ESOPHAGOGASTRODUODENOSCOPY N/A 08/18/2017   Dr. Gala Romney: Normal-appearing esophagus status post dilation.  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  Small hiatal hernia.  Marland Kitchen MALONEY DILATION N/A 08/18/2017   Procedure: Venia Minks DILATION;  Surgeon: Daneil Dolin, MD;  Location[facility];  Service: Endoscopy;  Laterality: N/A;  . MULTIPLE TOOTH EXTRACTIONS    . PR VEIN BYPASS GRAFT,AORTO-FEM-POP  06/12/10  . TEE WITHOUT CARDIOVERSION N/A 08/31/2014   Procedure: TRANSESOPHAGEAL ECHOCARDIOGRAM (TEE);  Surgeon: Rexene Alberts, MD;  Location: [facility];  Service: Open Heart Surgery;  Laterality: N/A;  . TRANSCATHETER AORTIC VALVE REPLACEMENT, TRANSFEMORAL N/A 08/31/2014   Procedure: TRANSCATHETER AORTIC VALVE REPLACEMENT, TRANSFEMORAL approach;  Surgeon: Rexene Alberts, MD;  Location: [facility];  Service: Open Heart Surgery;  Laterality: N/A;  . TUBAL LIGATION  1970's  . VAGINAL HYSTERECTOMY  1970's   Partial     Family History  Problem Relation Age of Onset  . Heart disease Mother 41       died young  . Varicose Veins Mother   . Stroke Father   . Hypertension Father   . Heart disease Father 44       Aneurysm  . Heart attack Father   . Arthritis Brother   . Stroke Brother   . Diabetes Daughter   . Arthritis Daughter   . Heart attack Brother        unsure  . Allergic rhinitis Neg Hx   . Angioedema Neg Hx   . Asthma Neg Hx   . Atopy Neg Hx   . Eczema Neg Hx   . Immunodeficiency Neg Hx   . Urticaria Neg Hx     Social History Social History   Tobacco Use  . Smoking status: Former Smoker    Packs/day: 2.00    Years: 56.00    Pack years: 112.00    Types: Cigarettes    Start date: 03/02/1952    Quit date: 06/01/2010    Years since quitting: 10.1  . Smokeless tobacco: Never Used  Vaping Use  . Vaping Use: Never used  Substance Use Topics  . Alcohol use: No    Alcohol/week: 0.0 standard drinks    Comment: 08/29/2014 "never drank much; quit in the 1990's"  . Drug use: No    Current Outpatient Medications  Medication Sig Dispense Refill  . amiodarone (PACERONE) 200 MG tablet Take 1 tablet by mouth once  daily 90 tablet 3  . Cholecalciferol (VITAMIN D3) 50 MCG (2000 UT) capsule Take 1 capsule by mouth daily.     Marland Kitchen diltiazem (CARDIZEM CD) 120 MG 24 hr capsule Take 1 capsule (120 mg total) by mouth daily. 90 capsule 3  . estradiol (ESTRACE) 2 MG  tablet Take 1 tablet by mouth once daily 90 tablet 0  . ipratropium (ATROVENT) 0.06 % nasal spray USE 1 SPRAY(S) IN EACH NOSTRIL EVERY 6 HOURS AS NEEDED FOR RUNNY NOSE 15 mL 6  . irbesartan (AVAPRO) 300 MG tablet Take 1 tablet (300 mg total) by mouth daily. 90 tablet 3  . meloxicam (MOBIC) 15 MG tablet Take 1 tablet by mouth once daily (Patient taking differently: as needed.) 30 tablet 3  . metoprolol succinate (TOPROL XL) 25 MG 24 hr tablet Take 1 tablet (25 mg total) by mouth daily. 90 tablet 3  . pantoprazole (PROTONIX) 40 MG tablet TAKE 1 TABLET BY MOUTH ONCE DAILY BEFORE SUPPER 90 tablet 3  . rosuvastatin (CRESTOR) 20 MG tablet TAKE 1 TABLET BY MOUTH AT BEDTIME 90 tablet 0  . warfarin (COUMADIN) 2.5 MG tablet Take 1 tablet by mouth once daily 90 tablet 0  . chlorthalidone (HYGROTON) 25 MG tablet Take 0.5 tablets (12.5 mg total) by mouth daily. (Patient taking differently: Take 12.5 mg by mouth as directed. Takes it on Monday and Friday) 45 tablet 3  . hydrALAZINE (APRESOLINE) 25 MG tablet Take 1 tablet (25 mg total) by mouth in the morning and at bedtime. 60 tablet 6  . nitroGLYCERIN (NITROSTAT) 0.4 MG SL tablet Place 1 tablet (0.4 mg total) under the tongue every 5 (five) minutes as needed for chest pain. 25 tablet 12  . NP THYROID 90 MG tablet Take 1 tablet by mouth once daily 90 tablet 0   No current facility-administered medications for this visit.    Allergies  Allergen Reactions  . Adhesive [Tape] Rash  . Latex Rash  . Tetanus Toxoids Rash    Review of Systems  Constitutional: Positive for activity change, fatigue and unexpected weight change. Negative for appetite change.  HENT: Negative for trouble swallowing and voice change.    Respiratory: Positive for cough and shortness of breath (With exertion). Negative for wheezing.   Cardiovascular: Positive for palpitations (Occasional) and leg swelling. Negative for chest pain.  Gastrointestinal: Positive for abdominal pain (Reflux).  Genitourinary: Negative for difficulty urinating and dysuria.  Musculoskeletal: Positive for arthralgias and joint swelling.  Neurological: Negative for syncope and weakness.       Prior strokes, no residual deficits  Hematological: Negative for adenopathy. Bruises/bleeds easily.  All other systems reviewed and are negative.   BP 121/63 (BP Location: Left Arm, Patient Position: Sitting)   Pulse 90   Resp 20   Ht 5\' 9"  (1.753 m)   Wt 176 lb (79.8 kg)   SpO2 97% Comment: RA  BMI 25.99 kg/m  Physical Exam Vitals reviewed.  Constitutional:      General: She is not in acute distress. HENT:     Head: Normocephalic and atraumatic.  Eyes:     General: No scleral icterus.    Extraocular Movements: Extraocular movements intact.  Neck:     Vascular: Carotid bruit (On right) present.  Cardiovascular:     Rate and Rhythm: Normal rate and regular rhythm.     Heart sounds: Normal heart sounds. No murmur heard. No gallop.   Pulmonary:     Effort: Pulmonary effort is normal. No respiratory distress.     Breath sounds: Normal breath sounds. No wheezing or rales.  Abdominal:     General: There is no distension.     Palpations: Abdomen is soft.  Musculoskeletal:        General: No swelling.     Cervical back: Neck supple.  Lymphadenopathy:     Cervical: No cervical adenopathy.  Skin:    General: Skin is warm and dry.  Neurological:     General: No focal deficit present.     Mental Status: She is alert and oriented to person, place, and time.     Cranial Nerves: No cranial nerve deficit.     Motor: No weakness.    Diagnostic Tests: CT CHEST WITH CONTRAST  TECHNIQUE: Multidetector CT imaging of the chest was performed  during intravenous contrast administration.  CONTRAST:  65mL OMNIPAQUE IOHEXOL 300 MG/ML  SOLN  COMPARISON:  Chest radiograph 06/21/2020. Chest CT 08/08/2014 reviewed.  FINDINGS: Cardiovascular: Moderate aortic atherosclerosis. No aortic aneurysm. TAVR. Left vertebral artery arises directly from the thoracic aorta, variant arch anatomy. Normal heart size. Coronary artery calcifications. No pericardial effusion or thickening.  Mediastinum/Nodes: Calcified mediastinal and right hilar lymph nodes consistent with prior granulomatous disease. Small noncalcified mediastinal nodes are all subcentimeter, for example 8 mm right lower paratracheal node, series 2, image 55. Anterior paratracheal node measures 7 mm, series 2, image 44. No supraclavicular or axillary adenopathy. No bulky right hilar adenopathy. There is no left hilar adenopathy. No thyroid nodule. No esophageal wall thickening.  Lungs/Pleura: Right upper lobe pulmonary mass measures 4.0 x 3.2 x 3.8 cm, series 4, image 47. Margins are lobulated and irregular in the superior portion with surrounding spiculation and ground-glass opacity extending peripherally. There is occlusion of segmental bronchi in the right upper lobe. Scattered internal calcifications. No necrosis. Scattered subpleural nodules in the right upper lobe include 5 mm anteriorly, series 4, image 35, 5 mm posteriorly series 4, image 38 and linear 7 mm opacity series 4, image 69. Tiny subpleural nodule in the superior segment of the right lower lobe measuring 3 mm, series 4, image 48. Nonspecific perifissural ground-glass in the right middle lobe, series 4, image 66, measures approximately 11 mm.  Four discrete clustered lingular nodules measuring up to 6 mm, series 4, image 81-84.  Calcified nodules in the anterior right upper lobe and lingula consistent with prior granulomatous disease.  Mild underlying emphysema. Biapical pleuroparenchymal  scarring which is similar to prior. No pleural fluid.  Upper Abdomen: No adrenal nodule. 8 mm enhancing focus in the left lobe of the liver is nonspecific, series 2, image 171. No upper abdominal adenopathy. No abdominal ascites.  Musculoskeletal: No destructive lytic lesion. Small sclerotic focus in the right humeral head with seen on prior exam and likely represents a bone island. Mild sclerotic cortical thickening of the left anterior first rib is also seen on prior, unchanged, and likely benign.  IMPRESSION: 1. Right upper lobe pulmonary mass measuring 4.0 x 3.2 x 3.8 cm, highly suspicious for primary bronchogenic malignancy. There is surrounding spiculation and ground-glass opacity extending peripherally. Recommend malignancy workup. 2. Small noncalcified mediastinal nodes are all subcentimeter, nonspecific. 3. Small subpleural nodules in the right upper lobe measure up to 5 mm. Clustered lingular nodules measuring up to 6 mm are nonspecific. There is also a nonspecific ground-glass opacity within the right middle lobe. 4. Sequela of prior granulomatous disease with calcified nodules, mediastinal and right hilar lymph nodes. 5. Nonspecific 8 mm enhancing focus in the left lobe of the liver. Attention on staging exam is recommended.  Aortic Atherosclerosis (ICD10-I70.0) and Emphysema (ICD10-J43.9).  These results will be called to the ordering clinician or representative by the Radiologist Assistant, and communication documented in the PACS or [employer].   Electronically Signed   By: Keith Rake  M.D.   On: 07/01/2020 11:40 NUCLEAR MEDICINE PET SKULL BASE TO THIGH  TECHNIQUE: 9.0 mCi F-18 FDG was injected intravenously. Full-ring PET imaging was performed from the skull base to thigh after the radiotracer. CT data was obtained and used for attenuation correction and anatomic localization.  Fasting blood glucose: 100 mg/dl  COMPARISON:  Chest  CT 06/28/2020  FINDINGS: Mediastinal blood pool activity: SUV max 3.0  Liver activity: SUV max NA  NECK: No hypermetabolic lymph nodes in the neck.  Incidental CT findings: none  CHEST: Right upper lobe pulmonary mass measuring 4.3 cm maximum dimension today is markedly hypermetabolic with SUV max = 05.6. Calcified nodal tissue is seen in the mediastinum and right hilum with low level hypermetabolism in the right hilum demonstrating SUV max = 3.5. Focus of hypermetabolism in the precarinal space is in the region of densely calcified nodal tissue with SUV max = 4.1.  Incidental CT findings: Status post AVR. Centrilobular emphsyema noted. 7 mm right middle lobe nodule shows only low level FDG uptake with SUV max = 1.15, below blood pool levels. Several additional scattered tiny pulmonary nodules are evident, below threshold for resolution on PET imaging. No evidence for pleural effusion.  ABDOMEN/PELVIS: No abnormal hypermetabolic activity within the liver, pancreas, adrenal glands, or spleen. No hypermetabolic lymph nodes in the abdomen or pelvis.  Incidental CT findings: Tiny calcified gallstones evident. Status post aorto bi femoral bypass grafting. Tiny fluid collection in the left groin region is not hypermetabolic and likely represents a small seroma or chronic hematoma.  SKELETON: No focal hypermetabolic activity to suggest skeletal metastasis.  Incidental CT findings: none  IMPRESSION: 1. Right upper lobe pulmonary mass is markedly hypermetabolic, consistent with primary bronchogenic neoplasm. 2. Low level hypermetabolism identified in the right hilum and precarinal station. There are multiple calcified lymph nodes in these regions suggesting prior granulomatous disease and no overt noncalcified lymphadenopathy evident on CT. Uptake may be related to chronic granulomatous involvement although metastatic lymphadenopathy not entirely excluded. 3. Several  additional scattered tiny pulmonary nodules show no hypermetabolism, but are below threshold for resolution on PET imaging. 4. Cholelithiasis. 5.  Aortic Atherosclerois (ICD10-170.0) 6.  Emphysema. ([id number]-A16.9)   Electronically Signed   By: Misty Stanley M.D.   On: 07/24/2020 15:55 I personally reviewed the CT and PET/CT images.  There is a 4 x 3.2 x 3.8 cm right upper lobe mass that is markedly hypermetabolic.  There are multiple calcified hilar and mediastinal nodes that are borderline hypermetabolic.  Multiple other small nodules.  Severe thoracic aortic atherosclerosis.  Status post TAVR.  Pulmonary function testing FVC 2.34 (66%) FEV1 1.57 (59%) FEV1 1.79 (67%) postbronchodilator DLCO 12.7 (55%)  Impression: Katelyn [name] is a 80 year old woman with a history of heavy tobacco abuse, gold 2 COPD, severe aortic stenosis, TAVR, severe thoracic aortic atherosclerotic disease, coronary artery disease, peripheral arterial disease, stroke, atrial fibrillation, chronic anticoagulation, hypertension, hyperlipidemia, arthritis, and reflux.  She presented with a cough and was found to have a right upper lobe lung mass.  Right upper lobe lung mass-about 4 cm maximum diameter.  Markedly hypermetabolic on PET/CT.  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  Infectious and inflammatory diseases are also in the differential but it would be extraordinarily unusual for this mass to be anything other than lung cancer.  There are some borderline enlarged and borderline metabolic lymph nodes but with calcification suggestive of prior granulomatous disease.  This  mass could be anything from T2 N0 (stage Ib) to T2,N2 (stage IIIa).  The other lung nodules are most likely infectious inflammatory in nature, but it is impossible to completely rule out the possibility of metastatic disease.  I think that is far less likely and we should give her  the benefit of the doubt when determining her initial course of therapy.  Treatment options include chemoradiation versus surgical resection.  We discussed the possibility of doing surgical resection for definitive diagnosis and treatment at the same setting.  The plan would be to do a robotic right upper lobectomy.  We discussed the general nature of the procedure including the need for general anesthesia, the incisions to be used, the use of the surgical robot, the possibility of conversion to open, the use of a drainage tube postoperatively, the expected hospital stay, and the overall recovery.  She understands there is no guarantee of a cure.  I informed her of the indications, risks, benefits, and alternatives.  She understands the risks include, but not limited to death, MI, DVT, PE, bleeding, possible need for transfusion, infection, stroke, prolonged air leak, cardiac arrhythmias, chronic pain, respiratory failure, as well as the possibility of other unforeseeable complications.  She would be high risk for cardiovascular complications.  CAD-cath in 2016 showed total mid right coronary, 70% ostial first diagonal and moderate circumflex disease.  On beta-blocker and statin.  Not currently having any anginal symptoms.  Severe aortic stenosis-status post TAVR in 2016.  Thoracic aortic atherosclerosis-severe porcelain aorta.  On statin and beta-blocker.    Plan: Tentatively plan robotic right upper lobectomy on Thursday, 08/15/2020 Will check with Dr. Claiborne Billings to see if any additional cardiac work-up is necessary  Melrose Nakayama, MD [facility] [phone]

## 2020-08-05 NOTE — Progress Notes (Signed)
PCP is Doree Albee, MD Referring Provider is Derek Jack, MD  Chief Complaint  Patient presents with  . Lung Lesion    Initial surgical consult, PFT 6/6, PET 5/24, CT chest 4/29     HPI: Katelyn [name] is sent for consultation regarding a right upper lobe lung mass.  Katelyn [name] is a 80 year old woman with a history of heavy tobacco abuse, gold 2 COPD, severe aortic stenosis, TAVR, severe thoracic aortic atherosclerotic disease, coronary artery disease, peripheral arterial disease, stroke, atrial fibrillation, chronic anticoagulation, hypertension, hyperlipidemia, arthritis, and reflux.  This spring she developed a cough.  This was associated with a cold initially but while the other symptoms resolved, her cough never did.  She finally went to the doctor a chest x-ray showed a right upper lobe opacity.  A CT of the chest showed a 4 cm right upper lobe lung mass.  There were some calcified hilar and mediastinal nodes that were not particularly enlarged.  There also were multiple other lung nodules bilaterally.  She had a PET/CT which showed the right upper lobe mass was markedly hypermetabolic.  There was some moderate increase in activity in the hilar and mediastinal lymph nodes.  She complains of decreased energy.  Her appetite is about normal.  She has lost 5 pounds over the past 3 months.  She gets short of breath with exertion but takes her dogs on walks.  She lives by herself and takes care of her own household.  She has a greater than 100-pack-year history of smoking prior to quitting 10 years ago.  Bruises easily from being on Coumadin.  Zubrod Score: At the time of surgery this patient's most appropriate activity status/level should be described as: []     0    Normal activity, no symptoms [x]     1    Restricted in physical strenuous activity but ambulatory, able to do out light work []     2    Ambulatory and capable of self care, unable to do work activities, up and about >50 %  of waking hours                              []     3    Only limited self care, in bed greater than 50% of waking hours []     4    Completely disabled, no self care, confined to bed or chair []     5    Moribund  Past Medical History:  Diagnosis Date  . Allergy    rhinitis  . Aortic stenosis, severe   . Aortic stenosis, severe   . Arthritis    spine and various joints  . Cataract   . Cerebrovascular disease 12/05/2018  . Childhood asthma   . Chronic bronchitis ([place])       . Claudication ([place])   . COPD GOLD II 07/18/2020  . Coronary artery disease    a. April 2012 which revealed mid RCA occlusion with collaterals, 50% LAD stenosis, 30% circumflex stenosis, and 40% marginal stenosis  . GERD (gastroesophageal reflux disease)   . Heart murmur   . Hyperlipidemia   . Hypertension   . LUMBAR RADICULOPATHY, RIGHT 12/21/2007   Qualifier: Diagnosis of  By: Aline Brochure MD, Dorothyann Peng    . LUMBAR RADICULOPATHY, RIGHT   . Peripheral vascular disease ([place])   . 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 Seroma, postoperative    Left Groin  . Severe aortic valve stenosis 08/31/2014  . Stroke [facility]) 1982 X 2   "a little bit weaker on the left side since" (08/29/2014)    Past Surgical History:  Procedure Laterality Date  . AORTO-FEMORAL BYPASS GRAFT Bilateral 06/27/10  . BASAL CELL CARCINOMA EXCISION Left    face  . CARDIAC CATHETERIZATION N/A 07/25/2014   Procedure: Right/Left Heart Cath and Coronary Angiography;  Surgeon: Troy Sine, MD;  Location: [place] [facility];  Service: Cardiovascular;  Laterality: N/A;  . CATARACT EXTRACTION, BILATERAL Bilateral 09/29/2016  . COLONOSCOPY  2008   Dr. Laural Golden: normal.   . DILATION AND CURETTAGE OF UTERUS  "2 or 3"  . ESOPHAGOGASTRODUODENOSCOPY N/A 08/18/2017   Dr. Gala Romney: Normal-appearing esophagus status post dilation.  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  Small hiatal hernia.  Marland Kitchen MALONEY DILATION N/A 08/18/2017   Procedure: Venia Minks DILATION;  Surgeon: Daneil Dolin, MD;  [facility];  Service: Endoscopy;  Laterality: N/A;  . MULTIPLE TOOTH EXTRACTIONS    . PR VEIN BYPASS GRAFT,AORTO-FEM-POP  06/12/10  . TEE WITHOUT CARDIOVERSION N/A 08/31/2014   Procedure: TRANSESOPHAGEAL ECHOCARDIOGRAM (TEE);  Surgeon: Rexene Alberts, MD;  Location: [facility];  Service: Open Heart Surgery;  Laterality: N/A;  . TRANSCATHETER AORTIC VALVE REPLACEMENT, TRANSFEMORAL N/A 08/31/2014   Procedure: TRANSCATHETER AORTIC VALVE REPLACEMENT, TRANSFEMORAL approach;  Surgeon: Rexene Alberts, MD;  Location: [place];  Service: Open Heart Surgery;  Laterality: N/A;  . TUBAL LIGATION  1970's  . VAGINAL HYSTERECTOMY  1970's   Partial     Family History  Problem Relation Age of Onset  . Heart disease Mother 48       died young  . Varicose Veins Mother   . Stroke Father   . Hypertension Father   . Heart disease Father 17       Aneurysm  . Heart attack Father   . Arthritis Brother   . Stroke Brother   . Diabetes Daughter   . Arthritis Daughter   . Heart attack Brother        unsure  . Allergic rhinitis Neg Hx   . Angioedema Neg Hx   . Asthma Neg Hx   . Atopy Neg Hx   . Eczema Neg Hx   . Immunodeficiency Neg Hx   . Urticaria Neg Hx     Social History Social History   Tobacco Use  . Smoking status: Former Smoker    Packs/day: 2.00    Years: 56.00    Pack years: 112.00    Types: Cigarettes    Start date: 03/02/1952    Quit date: 06/01/2010    Years since quitting: 10.1  . Smokeless tobacco: Never Used  Vaping Use  . Vaping Use: Never used  Substance Use Topics  . Alcohol use: No    Alcohol/week: 0.0 standard drinks    Comment: 08/29/2014 "never drank much; quit in the 1990's"  . Drug use: No    Current Outpatient Medications  Medication Sig Dispense Refill  . amiodarone (PACERONE) 200 MG tablet Take 1 tablet by mouth once  daily 90 tablet 3  . Cholecalciferol (VITAMIN D3) 50 MCG (2000 UT) capsule Take 1 capsule by mouth daily.     Marland Kitchen diltiazem (CARDIZEM CD) 120 MG 24 hr capsule Take 1 capsule (120 mg total) by mouth daily. 90 capsule 3  . estradiol (ESTRACE) 2 MG  tablet Take 1 tablet by mouth once daily 90 tablet 0  . ipratropium (ATROVENT) 0.06 % nasal spray USE 1 SPRAY(S) IN EACH NOSTRIL EVERY 6 HOURS AS NEEDED FOR RUNNY NOSE 15 mL 6  . irbesartan (AVAPRO) 300 MG tablet Take 1 tablet (300 mg total) by mouth daily. 90 tablet 3  . meloxicam (MOBIC) 15 MG tablet Take 1 tablet by mouth once daily (Patient taking differently: as needed.) 30 tablet 3  . metoprolol succinate (TOPROL XL) 25 MG 24 hr tablet Take 1 tablet (25 mg total) by mouth daily. 90 tablet 3  . pantoprazole (PROTONIX) 40 MG tablet TAKE 1 TABLET BY MOUTH ONCE DAILY BEFORE SUPPER 90 tablet 3  . rosuvastatin (CRESTOR) 20 MG tablet TAKE 1 TABLET BY MOUTH AT BEDTIME 90 tablet 0  . warfarin (COUMADIN) 2.5 MG tablet Take 1 tablet by mouth once daily 90 tablet 0  . chlorthalidone (HYGROTON) 25 MG tablet Take 0.5 tablets (12.5 mg total) by mouth daily. (Patient taking differently: Take 12.5 mg by mouth as directed. Takes it on Monday and Friday) 45 tablet 3  . hydrALAZINE (APRESOLINE) 25 MG tablet Take 1 tablet (25 mg total) by mouth in the morning and at bedtime. 60 tablet 6  . nitroGLYCERIN (NITROSTAT) 0.4 MG SL tablet Place 1 tablet (0.4 mg total) under the tongue every 5 (five) minutes as needed for chest pain. 25 tablet 12  . NP THYROID 90 MG tablet Take 1 tablet by mouth once daily 90 tablet 0   No current facility-administered medications for this visit.    Allergies  Allergen Reactions  . Adhesive [Tape] Rash  . Latex Rash  . Tetanus Toxoids Rash    Review of Systems  Constitutional: Positive for activity change, fatigue and unexpected weight change. Negative for appetite change.  HENT: Negative for trouble swallowing and voice change.    Respiratory: Positive for cough and shortness of breath (With exertion). Negative for wheezing.   Cardiovascular: Positive for palpitations (Occasional) and leg swelling. Negative for chest pain.  Gastrointestinal: Positive for abdominal pain (Reflux).  Genitourinary: Negative for difficulty urinating and dysuria.  Musculoskeletal: Positive for arthralgias and joint swelling.  Neurological: Negative for syncope and weakness.       Prior strokes, no residual deficits  Hematological: Negative for adenopathy. Bruises/bleeds easily.  All other systems reviewed and are negative.   BP 121/63 (BP Location: Left Arm, Patient Position: Sitting)   Pulse 90   Resp 20   Ht 5\' 9"  (1.753 m)   Wt 176 lb (79.8 kg)   SpO2 97% Comment: RA  BMI 25.99 kg/m  Physical Exam Vitals reviewed.  Constitutional:      General: She is not in acute distress. HENT:     Head: Normocephalic and atraumatic.  Eyes:     General: No scleral icterus.    Extraocular Movements: Extraocular movements intact.  Neck:     Vascular: Carotid bruit (On right) present.  Cardiovascular:     Rate and Rhythm: Normal rate and regular rhythm.     Heart sounds: Normal heart sounds. No murmur heard. No gallop.   Pulmonary:     Effort: Pulmonary effort is normal. No respiratory distress.     Breath sounds: Normal breath sounds. No wheezing or rales.  Abdominal:     General: There is no distension.     Palpations: Abdomen is soft.  Musculoskeletal:        General: No swelling.     Cervical back: Neck supple.  Lymphadenopathy:     Cervical: No cervical adenopathy.  Skin:    General: Skin is warm and dry.  Neurological:     General: No focal deficit present.     Mental Status: She is alert and oriented to person, place, and time.     Cranial Nerves: No cranial nerve deficit.     Motor: No weakness.    Diagnostic Tests: CT CHEST WITH CONTRAST  TECHNIQUE: Multidetector CT imaging of the chest was performed  during intravenous contrast administration.  CONTRAST:  59mL OMNIPAQUE IOHEXOL 300 MG/ML  SOLN  COMPARISON:  Chest radiograph 06/21/2020. Chest CT 08/08/2014 reviewed.  FINDINGS: Cardiovascular: Moderate aortic atherosclerosis. No aortic aneurysm. TAVR. Left vertebral artery arises directly from the thoracic aorta, variant arch anatomy. Normal heart size. Coronary artery calcifications. No pericardial effusion or thickening.  Mediastinum/Nodes: Calcified mediastinal and right hilar lymph nodes consistent with prior granulomatous disease. Small noncalcified mediastinal nodes are all subcentimeter, for example 8 mm right lower paratracheal node, series 2, image 55. Anterior paratracheal node measures 7 mm, series 2, image 44. No supraclavicular or axillary adenopathy. No bulky right hilar adenopathy. There is no left hilar adenopathy. No thyroid nodule. No esophageal wall thickening.  Lungs/Pleura: Right upper lobe pulmonary mass measures 4.0 x 3.2 x 3.8 cm, series 4, image 47. Margins are lobulated and irregular in the superior portion with surrounding spiculation and ground-glass opacity extending peripherally. There is occlusion of segmental bronchi in the right upper lobe. Scattered internal calcifications. No necrosis. Scattered subpleural nodules in the right upper lobe include 5 mm anteriorly, series 4, image 35, 5 mm posteriorly series 4, image 38 and linear 7 mm opacity series 4, image 69. Tiny subpleural nodule in the superior segment of the right lower lobe measuring 3 mm, series 4, image 48. Nonspecific perifissural ground-glass in the right middle lobe, series 4, image 66, measures approximately 11 mm.  Four discrete clustered lingular nodules measuring up to 6 mm, series 4, image 81-84.  Calcified nodules in the anterior right upper lobe and lingula consistent with prior granulomatous disease.  Mild underlying emphysema. Biapical pleuroparenchymal  scarring which is similar to prior. No pleural fluid.  Upper Abdomen: No adrenal nodule. 8 mm enhancing focus in the left lobe of the liver is nonspecific, series 2, image 171. No upper abdominal adenopathy. No abdominal ascites.  Musculoskeletal: No destructive lytic lesion. Small sclerotic focus in the right humeral head with seen on prior exam and likely represents a bone island. Mild sclerotic cortical thickening of the left anterior first rib is also seen on prior, unchanged, and likely benign.  IMPRESSION: 1. Right upper lobe pulmonary mass measuring 4.0 x 3.2 x 3.8 cm, highly suspicious for primary bronchogenic malignancy. There is surrounding spiculation and ground-glass opacity extending peripherally. Recommend malignancy workup. 2. Small noncalcified mediastinal nodes are all subcentimeter, nonspecific. 3. Small subpleural nodules in the right upper lobe measure up to 5 mm. Clustered lingular nodules measuring up to 6 mm are nonspecific. There is also a nonspecific ground-glass opacity within the right middle lobe. 4. Sequela of prior granulomatous disease with calcified nodules, mediastinal and right hilar lymph nodes. 5. Nonspecific 8 mm enhancing focus in the left lobe of the liver. Attention on staging exam is recommended.  Aortic Atherosclerosis (ICD10-I70.0) and Emphysema (ICD10-J43.9).  These results will be called to the ordering clinician or representative by the Radiologist Assistant, and communication documented in the PACS or [employer].   Electronically Signed   By: Keith Rake  M.D.   On: 07/01/2020 11:40 NUCLEAR MEDICINE PET SKULL BASE TO THIGH  TECHNIQUE: 9.0 mCi F-18 FDG was injected intravenously. Full-ring PET imaging was performed from the skull base to thigh after the radiotracer. CT data was obtained and used for attenuation correction and anatomic localization.  Fasting blood glucose: 100 mg/dl  COMPARISON:  Chest  CT 06/28/2020  FINDINGS: Mediastinal blood pool activity: SUV max 3.0  Liver activity: SUV max NA  NECK: No hypermetabolic lymph nodes in the neck.  Incidental CT findings: none  CHEST: Right upper lobe pulmonary mass measuring 4.3 cm maximum dimension today is markedly hypermetabolic with SUV max = 28.7. Calcified nodal tissue is seen in the mediastinum and right hilum with low level hypermetabolism in the right hilum demonstrating SUV max = 3.5. Focus of hypermetabolism in the precarinal space is in the region of densely calcified nodal tissue with SUV max = 4.1.  Incidental CT findings: Status post AVR. Centrilobular emphsyema noted. 7 mm right middle lobe nodule shows only low level FDG uptake with SUV max = 1.15, below blood pool levels. Several additional scattered tiny pulmonary nodules are evident, below threshold for resolution on PET imaging. No evidence for pleural effusion.  ABDOMEN/PELVIS: No abnormal hypermetabolic activity within the liver, pancreas, adrenal glands, or spleen. No hypermetabolic lymph nodes in the abdomen or pelvis.  Incidental CT findings: Tiny calcified gallstones evident. Status post aorto bi femoral bypass grafting. Tiny fluid collection in the left groin region is not hypermetabolic and likely represents a small seroma or chronic hematoma.  SKELETON: No focal hypermetabolic activity to suggest skeletal metastasis.  Incidental CT findings: none  IMPRESSION: 1. Right upper lobe pulmonary mass is markedly hypermetabolic, consistent with primary bronchogenic neoplasm. 2. Low level hypermetabolism identified in the right hilum and precarinal station. There are multiple calcified lymph nodes in these regions suggesting prior granulomatous disease and no overt noncalcified lymphadenopathy evident on CT. Uptake may be related to chronic granulomatous involvement although metastatic lymphadenopathy not entirely excluded. 3. Several  additional scattered tiny pulmonary nodules show no hypermetabolism, but are below threshold for resolution on PET imaging. 4. Cholelithiasis. 5.  Aortic Atherosclerois (ICD10-170.0) 6.  Emphysema. (OMV67-M09.9)   Electronically Signed   By: Misty Stanley M.D.   On: 07/24/2020 15:55 I personally reviewed the CT and PET/CT images.  There is a 4 x 3.2 x 3.8 cm right upper lobe mass that is markedly hypermetabolic.  There are multiple calcified hilar and mediastinal nodes that are borderline hypermetabolic.  Multiple other small nodules.  Severe thoracic aortic atherosclerosis.  Status post TAVR.  Pulmonary function testing FVC 2.34 (66%) FEV1 1.57 (59%) FEV1 1.79 (67%) postbronchodilator DLCO 12.7 (55%)  Impression: Katelyn [name] is a 80 year old woman with a history of heavy tobacco abuse, gold 2 COPD, severe aortic stenosis, TAVR, severe thoracic aortic atherosclerotic disease, coronary artery disease, peripheral arterial disease, stroke, atrial fibrillation, chronic anticoagulation, hypertension, hyperlipidemia, arthritis, and reflux.  She presented with a cough and was found to have a right upper lobe lung mass.  Right upper lobe lung mass-about 4 cm maximum diameter.  Markedly hypermetabolic on PET/CT.  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  Infectious and inflammatory diseases are also in the differential but it would be extraordinarily unusual for this mass to be anything other than lung cancer.  There are some borderline enlarged and borderline metabolic lymph nodes but with calcification suggestive of prior granulomatous disease.  This  mass could be anything from T2 N0 (stage Ib) to T2,N2 (stage IIIa).  The other lung nodules are most likely infectious inflammatory in nature, but it is impossible to completely rule out the possibility of metastatic disease.  I think that is far less likely and we should give her  the benefit of the doubt when determining her initial course of therapy.  Treatment options include chemoradiation versus surgical resection.  We discussed the possibility of doing surgical resection for definitive diagnosis and treatment at the same setting.  The plan would be to do a robotic right upper lobectomy.  We discussed the general nature of the procedure including the need for general anesthesia, the incisions to be used, the use of the surgical robot, the possibility of conversion to open, the use of a drainage tube postoperatively, the expected hospital stay, and the overall recovery.  She understands there is no guarantee of a cure.  I informed her of the indications, risks, benefits, and alternatives.  She understands the risks include, but not limited to death, MI, DVT, PE, bleeding, possible need for transfusion, infection, stroke, prolonged air leak, cardiac arrhythmias, chronic pain, respiratory failure, as well as the possibility of other unforeseeable complications.  She would be high risk for cardiovascular complications.  CAD-cath in 2016 showed total mid right coronary, 70% ostial first diagonal and moderate circumflex disease.  On beta-blocker and statin.  Not currently having any anginal symptoms.  Severe aortic stenosis-status post TAVR in 2016.  Thoracic aortic atherosclerosis-severe porcelain aorta.  On statin and beta-blocker.    Plan: Tentatively plan robotic right upper lobectomy on Thursday, 08/15/2020 Will check with Katelyn [name] to see if any additional cardiac work-up is necessary  Melrose Nakayama, MD [facility] [phone]

## 2020-08-06 LAB — POCT INR: INR: 2.3 (ref 2.0–3.0)

## 2020-08-07 ENCOUNTER — Encounter: Payer: Self-pay | Admitting: *Deleted

## 2020-08-12 DIAGNOSIS — Z7901 Long term (current) use of anticoagulants: Secondary | ICD-10-CM | POA: Diagnosis not present

## 2020-08-12 LAB — POCT INR: INR: 2 (ref 2–3)

## 2020-08-12 NOTE — Pre-Procedure Instructions (Signed)
Surgical Instructions    Your procedure is scheduled on Thursday, June 16th.  Report to [facility] Main Entrance "A" at 8:00 A.M., then check in with the Admitting office.  Call this number if you have problems the morning of surgery:  [phone]   If you have any questions prior to your surgery date call [phone]: Open Monday-Friday 8am-4pm    Remember:  Do not eat or drink after midnight the night before your surgery    Take these medicines the morning of surgery with A SIP OF WATER amiodarone (PACERONE)  diltiazem (CARDIZEM CD) estradiol (ESTRACE) hydrALAZINE (APRESOLINE) metoprolol succinate (TOPROL XL)  NP THYROID  Nasal spray nitroGLYCERIN (NITROSTAT)-as needed for chest pain. Please notify a nurse if you had to use this.   Follow your surgeon's instructions on when to stop/resume warfarin (COUMADIN).  If no instructions were given by your surgeon then you will need to call the office to get those instructions.    As of today, STOP taking any Aspirin (unless otherwise instructed by your surgeon), meloxicam (MOBIC), Aleve, Naproxen, Ibuprofen, Motrin, Advil, Goody's, BC's, all herbal medications, fish oil, and all vitamins.                     Do NOT Smoke (Tobacco/Vaping) or drink Alcohol 24 hours prior to your procedure.  If you use a CPAP at night, you may bring all equipment for your overnight stay.   Contacts, glasses, piercing's, hearing aid's, dentures or partials may not be worn into surgery, please bring cases for these belongings.    For patients admitted to the hospital, discharge time will be determined by your treatment team.   Patients discharged the day of surgery will not be allowed to drive home, and someone needs to stay with them for 24 hours.    Special instructions:   [place]- Preparing For Surgery  Before surgery, you can play an important role. Because skin is not sterile, your skin needs to be as free of germs as possible. You can  reduce the number of germs on your skin by washing with CHG (chlorahexidine gluconate) Soap before surgery.  CHG is an antiseptic cleaner which kills germs and bonds with the skin to continue killing germs even after washing.    Oral Hygiene is also important to reduce your risk of infection.  Remember - BRUSH YOUR TEETH THE MORNING OF SURGERY WITH YOUR REGULAR TOOTHPASTE  Please do not use if you have an allergy to CHG or antibacterial soaps. If your skin becomes reddened/irritated stop using the CHG.  Do not shave (including legs and underarms) for at least 48 hours prior to first CHG shower. It is OK to shave your face.  Please follow these instructions carefully.   Shower the NIGHT BEFORE SURGERY and the MORNING OF SURGERY  If you chose to wash your hair, wash your hair first as usual with your normal shampoo.  After you shampoo, rinse your hair and body thoroughly to remove the shampoo.  Use CHG Soap as you would any other liquid soap. You can apply CHG directly to the skin and wash gently with a scrungie or a clean washcloth.   Apply the CHG Soap to your body ONLY FROM THE NECK DOWN.  Do not use on open wounds or open sores. Avoid contact with your eyes, ears, mouth and genitals (private parts). Wash Face and genitals (private parts)  with your normal soap.   Wash thoroughly, paying special attention to the  area where your surgery will be performed.  Thoroughly rinse your body with warm water from the neck down.  DO NOT shower/wash with your normal soap after using and rinsing off the CHG Soap.  Pat yourself dry with a CLEAN TOWEL.  Wear CLEAN PAJAMAS to bed the night before surgery  Place CLEAN SHEETS on your bed the night before your surgery  DO NOT SLEEP WITH PETS.   Day of Surgery: Shower with CHG soap. Do not wear jewelry, make up, or nail polish Do not wear lotions, powders, perfumes, or deodorant. Do not shave 48 hours prior to surgery.  Do not bring valuables to  the hospital. [facility] is not responsible for any belongings or valuables. Wear Clean/Comfortable clothing the morning of surgery Remember to brush your teeth WITH YOUR REGULAR TOOTHPASTE.   Please read over the following fact sheets that you were given.

## 2020-08-13 ENCOUNTER — Encounter (HOSPITAL_COMMUNITY)
Admission: RE | Admit: 2020-08-13 | Discharge: 2020-08-13 | Disposition: A | Discharge status: Home / Self Care | Payer: Medicare Other | Source: Ambulatory Visit | Attending: Thoracic Surgery (Cardiothoracic Vascular Surgery) | Admitting: Thoracic Surgery (Cardiothoracic Vascular Surgery)

## 2020-08-13 ENCOUNTER — Ambulatory Visit (HOSPITAL_COMMUNITY)
Admission: RE | Admit: 2020-08-13 | Discharge: 2020-08-13 | Disposition: A | Discharge status: Home / Self Care | Payer: Medicare Other | Source: Ambulatory Visit | Attending: Thoracic Surgery (Cardiothoracic Vascular Surgery) | Admitting: Thoracic Surgery (Cardiothoracic Vascular Surgery)

## 2020-08-13 ENCOUNTER — Other Ambulatory Visit: Payer: Self-pay

## 2020-08-13 ENCOUNTER — Encounter (HOSPITAL_COMMUNITY): Payer: Self-pay

## 2020-08-13 DIAGNOSIS — I700 Atherosclerosis of aorta: Secondary | ICD-10-CM | POA: Diagnosis not present

## 2020-08-13 DIAGNOSIS — Z20822 Contact with and (suspected) exposure to covid-19: Secondary | ICD-10-CM | POA: Diagnosis not present

## 2020-08-13 DIAGNOSIS — N170 Acute kidney failure with tubular necrosis: Secondary | ICD-10-CM | POA: Diagnosis not present

## 2020-08-13 DIAGNOSIS — I739 Peripheral vascular disease, unspecified: Secondary | ICD-10-CM | POA: Diagnosis not present

## 2020-08-13 DIAGNOSIS — Z01818 Encounter for other preprocedural examination: Secondary | ICD-10-CM

## 2020-08-13 DIAGNOSIS — R918 Other nonspecific abnormal finding of lung field: Secondary | ICD-10-CM | POA: Diagnosis not present

## 2020-08-13 DIAGNOSIS — J439 Emphysema, unspecified: Secondary | ICD-10-CM | POA: Diagnosis not present

## 2020-08-13 DIAGNOSIS — I10 Essential (primary) hypertension: Secondary | ICD-10-CM | POA: Diagnosis not present

## 2020-08-13 DIAGNOSIS — I4819 Other persistent atrial fibrillation: Secondary | ICD-10-CM | POA: Diagnosis not present

## 2020-08-13 DIAGNOSIS — I350 Nonrheumatic aortic (valve) stenosis: Secondary | ICD-10-CM | POA: Diagnosis not present

## 2020-08-13 DIAGNOSIS — J9382 Other air leak: Secondary | ICD-10-CM | POA: Diagnosis not present

## 2020-08-13 DIAGNOSIS — I898 Other specified noninfective disorders of lymphatic vessels and lymph nodes: Secondary | ICD-10-CM | POA: Diagnosis not present

## 2020-08-13 DIAGNOSIS — C3411 Malignant neoplasm of upper lobe, right bronchus or lung: Secondary | ICD-10-CM | POA: Diagnosis not present

## 2020-08-13 DIAGNOSIS — D71 Functional disorders of polymorphonuclear neutrophils: Secondary | ICD-10-CM | POA: Diagnosis not present

## 2020-08-13 HISTORY — DX: Left bundle-branch block, unspecified: I44.7

## 2020-08-13 HISTORY — DX: Paroxysmal atrial fibrillation: I48.0

## 2020-08-13 HISTORY — DX: Disorder of arteries and arterioles, unspecified: I77.9

## 2020-08-13 LAB — BLOOD GAS, ARTERIAL
Acid-base deficit: 2.7 mmol/L — ABNORMAL HIGH (ref 0.0–2.0)
Bicarbonate: 22.2 mmol/L (ref 20.0–28.0)
Drawn by: 58793
FIO2: 21
O2 Saturation: 94.3 %
Patient temperature: 37
pCO2 arterial: 42.7 mmHg (ref 32.0–48.0)
pH, Arterial: 7.336 — ABNORMAL LOW (ref 7.350–7.450)
pO2, Arterial: 78.7 mmHg — ABNORMAL LOW (ref 83.0–108.0)

## 2020-08-13 LAB — URINALYSIS, ROUTINE W REFLEX MICROSCOPIC
Bacteria, UA: NONE SEEN
Bilirubin Urine: NEGATIVE
Glucose, UA: NEGATIVE mg/dL
Ketones, ur: NEGATIVE mg/dL
Leukocytes,Ua: NEGATIVE
Nitrite: NEGATIVE
Protein, ur: NEGATIVE mg/dL
Specific Gravity, Urine: 1.017 (ref 1.005–1.030)
pH: 5 (ref 5.0–8.0)

## 2020-08-13 LAB — CBC
HCT: 40.9 % (ref 36.0–46.0)
Hemoglobin: 13 g/dL (ref 12.0–15.0)
MCH: 30.6 pg (ref 26.0–34.0)
MCHC: 31.8 g/dL (ref 30.0–36.0)
MCV: 96.2 fL (ref 80.0–100.0)
Platelets: 165 10*3/uL (ref 150–400)
RBC: 4.25 MIL/uL (ref 3.87–5.11)
RDW: 14.6 % (ref 11.5–15.5)
WBC: 7.6 10*3/uL (ref 4.0–10.5)
nRBC: 0 % (ref 0.0–0.2)

## 2020-08-13 LAB — COMPREHENSIVE METABOLIC PANEL
ALT: 16 U/L (ref 0–44)
AST: 20 U/L (ref 15–41)
Albumin: 3.5 g/dL (ref 3.5–5.0)
Alkaline Phosphatase: 74 U/L (ref 38–126)
Anion gap: 7 (ref 5–15)
BUN: 27 mg/dL — ABNORMAL HIGH (ref 8–23)
CO2: 22 mmol/L (ref 22–32)
Calcium: 8.9 mg/dL (ref 8.9–10.3)
Chloride: 106 mmol/L (ref 98–111)
Creatinine, Ser: 1.86 mg/dL — ABNORMAL HIGH (ref 0.44–1.00)
GFR, Estimated: 28 mL/min — ABNORMAL LOW (ref >60)
Glucose, Bld: 103 mg/dL — ABNORMAL HIGH (ref 70–99)
Potassium: 4.6 mmol/L (ref 3.5–5.1)
Sodium: 135 mmol/L (ref 135–145)
Total Bilirubin: 0.8 mg/dL (ref 0.3–1.2)
Total Protein: 6.7 g/dL (ref 6.5–8.1)

## 2020-08-13 LAB — SURGICAL PCR SCREEN
MRSA, PCR: NEGATIVE
Staphylococcus aureus: NEGATIVE

## 2020-08-13 NOTE — Progress Notes (Addendum)
PCP - Dr. Hurshel Party Cardiologist - Dr. Shelva Majestic  PPM/ICD - n/a Device Orders - n/a Rep Notified - n/a  Chest x-ray - 08/13/20 EKG - 08/13/20 Stress Test - 2012 ECHO - 06/22/19 Cardiac Cath - 2016  Sleep Study - denies CPAP - n/a  Fasting Blood Sugar - n/a Checks Blood Sugar: n/a  Blood Thinner Instructions: Per surgeon's instructions patient states she took last dose of Coumadin on 08/08/20.  Aspirin Instructions: n/a   COVID TEST- 08/13/20   Anesthesia review: Yes. Cardiac History. EKG Review. Creatinine 1.86.  Patient denies shortness of breath, fever, cough and chest pain at PAT appointment   All instructions explained to the patient, with a verbal understanding of the material. Patient agrees to go over the instructions while at home for a better understanding. Patient also instructed to self quarantine after being tested for COVID-19. The opportunity to ask questions was provided.

## 2020-08-14 ENCOUNTER — Encounter (HOSPITAL_COMMUNITY): Payer: Self-pay

## 2020-08-14 NOTE — Anesthesia Preprocedure Evaluation (Addendum)
Anesthesia Evaluation  Patient identified by MRN, date of birth, ID band Patient awake    Reviewed: Allergy & Precautions, NPO status , Patient's Chart, lab work & pertinent test results, reviewed documented beta blocker date and time   History of Anesthesia Complications Negative for: history of anesthetic complications  Airway Mallampati: II  TM Distance: >3 FB Neck ROM: Full    Dental  (+) Dental Advisory Given, Partial Lower, Upper Dentures   Pulmonary asthma , COPD, former smoker,    Pulmonary exam normal        Cardiovascular hypertension, Pt. on medications and Pt. on home beta blockers pulmonary hypertension+ CAD and + Peripheral Vascular Disease  + dysrhythmias Atrial Fibrillation + Valvular Problems/Murmurs (s/p TAVR)  Rhythm:Regular Rate:Bradycardia   '22 Carotid US - 40-59% right ICAS, 1-39% left ICAS  '21 TTE - EF 60 to 65%. Moderate LVH. Moderately elevated pulmonary artery systolic pressure. Left atrial size was moderately dilated. Mild MR. Post TAVR with 26 mm Sapien 3 valve stable gradients since 05/19/18 and no PVL.    Neuro/Psych CVA, Residual Symptoms negative psych ROS   GI/Hepatic Neg liver ROS, GERD  Medicated,  Endo/Other  Hypothyroidism   Renal/GU negative Renal ROS     Musculoskeletal  (+) Arthritis ,   Abdominal   Peds  Hematology  On coumadin, last dose Friday    Anesthesia Other Findings   Reproductive/Obstetrics                          Anesthesia Physical Anesthesia Plan  ASA: 3  Anesthesia Plan: General   Post-op Pain Management:    Induction: Intravenous  PONV Risk Score and Plan: 3 and Treatment may vary due to age or medical condition, Ondansetron, Aprepitant and Dexamethasone  Airway Management Planned: Double Lumen EBT  Additional Equipment: Arterial line, CVP and Ultrasound Guidance Line Placement  Intra-op Plan:   Post-operative Plan:  Possible Post-op intubation/ventilation  Informed Consent: I have reviewed the patients History and Physical, chart, labs and discussed the procedure including the risks, benefits and alternatives for the proposed anesthesia with the patient or authorized representative who has indicated his/her understanding and acceptance.     Dental advisory given  Plan Discussed with: CRNA and Anesthesiologist  Anesthesia Plan Comments:       Anesthesia Quick Evaluation

## 2020-08-14 NOTE — Progress Notes (Signed)
Covid test needs to be repeated DOS.

## 2020-08-14 NOTE — Progress Notes (Signed)
Anesthesia Chart Review:   Case: [id number] Date/Time: 08/15/20 0957   Procedure: XI ROBOTIC ASSISTED THORASCOPY-RIGHT UPPER LOBECTOMY (Right: Chest)   Anesthesia type: General   Pre-op diagnosis: RUL MASS   Location: [facility] OR ROOM 10 / [facility]   Surgeons: Melrose Nakayama, MD       DISCUSSION: Patient is a 80 year old female scheduled for the above procedure.  History includes former smoker, COPD (Gold II), murmur/aortic stenosis (s/p TAVR 08/31/14), CAD, HTN, HLD, PAD (s/p AFBG 06/27/10), PAF, GERD, CVA (1982), carotid artery stenosis, renal artery stenosis (> 60% L 03/06/20 [place]). Labs trends suggest CKD stage III.   Dr. Koleen Nimrod classified her Zubrod score as 1, restricted in physical strenuous activity but ambulatory, able to do light work. He notes CAD and TAVR history. She had CTO RCA with collaterals, 70% D1, and moderate CX disease by 2016 cath that was treated medically. She is on statin and b-blocker. No angina. Dr. Roxan Hockey planned to discuss surgery plans with her cardiologist Dr. Claiborne Billings who saw her last on 04/29/20 with plans to continue current regimen and follow-up in six months.   Last warfarin 08/08/20. 08/13/20 presurgical COVID-19 test is in process. Anesthesia team to evaluate on the day of surgery.    VS: BP (!) 163/67   Pulse (!) 52   Temp 36.6 C (Oral)   Resp 19   Ht 5\' 10"  (1.778 m)   Wt 80.1 kg   SpO2 97%   BMI 25.33 kg/m    PROVIDERS: Doree Albee, MD is PCP  Shelva Majestic, MD is cardiologist Christinia Gully, MD is pulmonologist Derek Jack, MD is HEM-[facility]   LABS: Preoperative labs noted. Cr 1.86 which is somewhat above her baseline--lab trends suggest CKD stage III, Cr 1.31-1.70 over the past six months. Surgeon can follow postoperatively. A1c 5.7% on 06/26/20.  (all labs ordered are listed, but only abnormal results are displayed)  Labs Reviewed  COMPREHENSIVE METABOLIC PANEL - Abnormal; Notable for the following components:      Result Value    Glucose, Bld 103 (*)    BUN 27 (*)    Creatinine, Ser 1.86 (*)    GFR, Estimated 28 (*)    All other components within normal limits  BLOOD GAS, ARTERIAL - Abnormal; Notable for the following components:   pH, Arterial 7.336 (*)    pO2, Arterial 78.7 (*)    Acid-base deficit 2.7 (*)    Allens test (pass/fail) BRACHIAL ARTERY (*)    All other components within normal limits  URINALYSIS, ROUTINE W REFLEX MICROSCOPIC - Abnormal; Notable for the following components:   APPearance HAZY (*)    Hgb urine dipstick SMALL (*)    All other components within normal limits  SURGICAL PCR SCREEN  SARS CORONAVIRUS 2 (TAT 6-24 HRS)  CBC  TYPE AND SCREEN   PFTs 08/05/20: FVC 2.34 (66%), post 2.70 (77%).  FEV1 1.57 (59%), post 1.79 (67%).  DLCO unc 13.70 (59%), cor 12.8 7 (55%).   IMAGES: CXR 08/13/20: IMPRESSION: 1. Right upper lobe mass with evolving postobstructive changes, as above. 2. Aortic atherosclerosis.  MRI Brain 07/26/20: IMPRESSION: No evidence of intracranial metastatic disease. Chronic microvascular ischemic changes with chronic small infarcts.  PET Scan 07/23/20: IMPRESSION: 1. Right upper lobe pulmonary mass is markedly hypermetabolic, consistent with primary bronchogenic neoplasm. 2. Low level hypermetabolism identified in the right hilum and precarinal station. There are multiple calcified lymph nodes in these regions suggesting prior granulomatous disease and no overt noncalcified lymphadenopathy evident  on CT. Uptake may be related to chronic granulomatous involvement although metastatic lymphadenopathy not entirely excluded. 3. Several additional scattered tiny pulmonary nodules show no hypermetabolism, but are below threshold for resolution on PET imaging. 4. Cholelithiasis. 5.  Aortic Atherosclerois (ICD10-170.0) 6.  Emphysema. ([id number])   EKG: 08/13/20:  Sinus bradycardia at 56 bpm 1st degree A-V block Left bundle branch block Abnormal ECG - EKG stable  when compared to 04/29/20 tracing from [facility]   CV: Carotid US 08/01/20: Summary:  - Right Carotid: Velocities in the right ICA are consistent with a 40-59% stenosis. Non-hemodynamically significant plaque <50% noted in the CCA. Increase PSV; essentially 40-59% RICA stenosis.  - Left Carotid: Velocities in the left ICA are consistent with a 1-39% stenosis. Non-hemodynamically significant plaque <50% noted in the CCA. The ECA appears >50% stenosed. Essentially stable LICA velocities.  - Vertebrals:  Bilateral vertebral arteries demonstrate antegrade flow.  - Subclavians: Bilateral subclavian arteries were stenotic. Bilateral subclavian artery flow was disturbed.    Renal Artery [place] 03/06/20: Summary:  Renal:  Technically challenging examination.  Right: No evidence of right renal artery stenosis. RRV flow present. Normal size right kidney. Normal right Resisitive Index. Normal cortical thickness of right kidney.  Left:  Evidence of a > 60% stenosis in the left renal artery. LRV flow present. Normal size of left kidney. Abnormal left Resisitve Index. Normal cortical thickness of the left kidney.  Mesenteric:  Normal Celiac artery and Superior Mesenteric artery findings. Areas of limited visceral study include right renal artery, right kidney size, left renal artery, left kidney size and left parenchymal flow.  - Suggest alternative imaging for further evaluation of renal artery stenosis if clinically indicated.    Echo 06/22/19: IMPRESSIONS   1. Left ventricular ejection fraction, by estimation, is 60 to 65%. The  left ventricle has normal function. The left ventricle has no regional  wall motion abnormalities. There is moderate left ventricular hypertrophy.  Left ventricular diastolic  parameters are indeterminate.   2. Right ventricular systolic function is normal. The right ventricular  size is normal. There is moderately elevated pulmonary artery systolic  pressure.   3. Left  atrial size was moderately dilated.   4. The mitral valve is normal in structure. Mild mitral valve  regurgitation. No evidence of mitral stenosis.   5. Post TAVR with 26 mm Sapien 3 valve stable gradients since 05/19/18 and  no PVL. The aortic valve has been repaired/replaced. Aortic valve  regurgitation is not visualized. No aortic stenosis is present.   6. The inferior vena cava is normal in size with greater than 50%  respiratory variability, suggesting right atrial pressure of 3 mmHg.     Cardiac cath 07/25/14 (Pre-TAVR): Mid RCA lesion, 100% stenosed. Ost 1st Diag lesion, 70% stenosed. Ost Cx lesion, 50% stenosed. Mid Cx lesion, 40% stenosed. Ost RCA lesion, 60% stenosed. Normal LV function with an ejection fraction at a proximally 55% but with evidence for small focal region of mid to basal inferior hypocontractility. 2+ angiographic mitral regurgitation with mean palmar E wedge pressure at 19 and V wave at 31 mmHg. Severe aortic valve stenosis with a peak to peak gradient of 46 mmHg, and a mean gradient of 39.5 mmHg.  Calculated aortic valve area 0.64 cm. 1 - 2+ angiographic aortic regurgitation. Significant native coronary obstructive disease with evidence for coronary calcification and 70% proximal first diagonal stenosis; 50% proximal circumflex stenosis before the first marginal branch with 40% AV groove circumflex stenosis; 60% ostial narrowing  of the RCA with 100% mid RCA occlusion after a marginal branch with bridging collaterals supplying the mid RCA extending from this marginal branch as well as left-to-right collaterals from the distal circumflex and LAD to the distal RCA. RECOMMENDATION: Surgical consultation will be obtained for aortic valve replacement/CABG revascularization surgery. - CT surgeon felt symptoms were related to her severe AS. She had known completely calcified ascending aorta and arch with prior abdominal aortic occlusion, so not felt to be a candidate for  surgical AVR and CABG. TAVR recommended and performed on 08/31/14. Future PCI may be considered should she develop agina symptoms following treatment of her AS.     Past Medical History:  Diagnosis Date   Allergy    rhinitis   Aortic stenosis, severe    Aortic stenosis, severe    Arthritis    spine and various joints   Carotid artery disease (HCC)    Cataract    Cerebrovascular disease 12/05/2018   Childhood asthma    Chronic bronchitis ([place])        Claudication ([place])    COPD GOLD II 07/18/2020   Coronary artery disease    a. 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   26 mm Edwards Sapien 3 transcatheter heart valve placed via open right transfemoral approach   Seroma, postoperative    Left Groin   Severe aortic valve stenosis 08/31/2014   Stroke ([place]) 1982 X 2   "a little bit weaker on the left side since" (08/29/2014)    Past Surgical History:  Procedure Laterality Date   AORTO-FEMORAL BYPASS GRAFT Bilateral 06/27/10   BASAL CELL CARCINOMA EXCISION Left    face   CARDIAC CATHETERIZATION N/A 07/25/2014   Procedure: Right/Left Heart Cath and Coronary Angiography;  Surgeon: Troy Sine, MD;  Location: [facility];  Service: Cardiovascular;  Laterality: N/A;   CATARACT EXTRACTION, BILATERAL Bilateral 09/29/2016   COLONOSCOPY  2008   Dr. Laural Golden: normal.    DILATION AND CURETTAGE OF UTERUS  "2 or 3"   ESOPHAGOGASTRODUODENOSCOPY N/A 08/18/2017   Dr. Gala Romney: Normal-appearing esophagus status  post dilation.  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  Small hiatal hernia.   MALONEY DILATION N/A 08/18/2017   Procedure: Venia Minks DILATION;  Surgeon: Daneil Dolin, MD;  Location[facility];  Service: Endoscopy;  Laterality: N/A;   MULTIPLE TOOTH EXTRACTIONS     PR VEIN BYPASS GRAFT,AORTO-FEM-POP  06/12/10   TEE WITHOUT CARDIOVERSION N/A 08/31/2014   Procedure: TRANSESOPHAGEAL ECHOCARDIOGRAM (TEE);  Surgeon: Rexene Alberts, MD;  Location: [facility];  Service: Open Heart Surgery;  Laterality: N/A;   TRANSCATHETER AORTIC VALVE REPLACEMENT, TRANSFEMORAL N/A 08/31/2014   Procedure: TRANSCATHETER AORTIC VALVE REPLACEMENT, TRANSFEMORAL approach;  Surgeon: Rexene Alberts, MD;  Location: [facility];  Service: Open Heart Surgery;  Laterality: N/A;   TUBAL LIGATION  1970's   VAGINAL HYSTERECTOMY  1970's   Partial     MEDICATIONS:  amiodarone (PACERONE) 200 MG tablet   chlorthalidone (HYGROTON)  25 MG tablet   Cholecalciferol (VITAMIN D3) 50 MCG (2000 UT) capsule   diltiazem (CARDIZEM CD) 120 MG 24 hr capsule   estradiol (ESTRACE) 2 MG tablet   hydrALAZINE (APRESOLINE) 25 MG tablet   ipratropium (ATROVENT) 0.06 % nasal spray   irbesartan (AVAPRO) 300 MG tablet   meloxicam (MOBIC) 15 MG tablet   metoprolol succinate (TOPROL XL) 25 MG 24 hr tablet   nitroGLYCERIN (NITROSTAT) 0.4 MG SL tablet   NP THYROID 90 MG tablet   pantoprazole (PROTONIX) 40 MG tablet   rosuvastatin (CRESTOR) 20 MG tablet   warfarin (COUMADIN) 2.5 MG tablet   No current facility-administered medications for this encounter.    Myra Gianotti, PA-C Surgical Short Stay/Anesthesiology [facility] Phone [phone] [facility] Phone [phone] 08/14/2020 10:36 AM

## 2020-08-15 ENCOUNTER — Inpatient Hospital Stay (HOSPITAL_COMMUNITY): Payer: Medicare Other

## 2020-08-15 ENCOUNTER — Inpatient Hospital Stay (HOSPITAL_COMMUNITY): Payer: Medicare Other | Admitting: Vascular Surgery

## 2020-08-15 ENCOUNTER — Encounter (HOSPITAL_COMMUNITY)
Admission: RE | Disposition: A | Discharge status: Home Health | Payer: Self-pay | Source: Home / Self Care | Attending: Thoracic Surgery (Cardiothoracic Vascular Surgery)

## 2020-08-15 ENCOUNTER — Ambulatory Visit (INDEPENDENT_AMBULATORY_CARE_PROVIDER_SITE_OTHER): Payer: Self-pay | Admitting: Nurse Practitioner

## 2020-08-15 ENCOUNTER — Inpatient Hospital Stay (HOSPITAL_COMMUNITY): Payer: Medicare Other | Admitting: Registered Nurse

## 2020-08-15 ENCOUNTER — Inpatient Hospital Stay (HOSPITAL_COMMUNITY)
Admission: RE | Admit: 2020-08-15 | Discharge: 2020-08-27 | DRG: 163 | Disposition: A | Discharge status: Home Health | Payer: Medicare Other | Attending: Thoracic Surgery (Cardiothoracic Vascular Surgery) | Admitting: Thoracic Surgery (Cardiothoracic Vascular Surgery)

## 2020-08-15 ENCOUNTER — Other Ambulatory Visit: Payer: Self-pay

## 2020-08-15 ENCOUNTER — Encounter (HOSPITAL_COMMUNITY): Payer: Self-pay | Admitting: Thoracic Surgery (Cardiothoracic Vascular Surgery)

## 2020-08-15 ENCOUNTER — Telehealth (INDEPENDENT_AMBULATORY_CARE_PROVIDER_SITE_OTHER): Payer: Self-pay | Admitting: Nurse Practitioner

## 2020-08-15 DIAGNOSIS — Z9889 Other specified postprocedural states: Secondary | ICD-10-CM | POA: Diagnosis not present

## 2020-08-15 DIAGNOSIS — C3411 Malignant neoplasm of upper lobe, right bronchus or lung: Secondary | ICD-10-CM | POA: Diagnosis present

## 2020-08-15 DIAGNOSIS — I708 Atherosclerosis of other arteries: Secondary | ICD-10-CM | POA: Diagnosis present

## 2020-08-15 DIAGNOSIS — Z452 Encounter for adjustment and management of vascular access device: Secondary | ICD-10-CM | POA: Diagnosis not present

## 2020-08-15 DIAGNOSIS — I4819 Other persistent atrial fibrillation: Secondary | ICD-10-CM | POA: Diagnosis present

## 2020-08-15 DIAGNOSIS — Z4682 Encounter for fitting and adjustment of non-vascular catheter: Secondary | ICD-10-CM | POA: Diagnosis not present

## 2020-08-15 DIAGNOSIS — Z8261 Family history of arthritis: Secondary | ICD-10-CM

## 2020-08-15 DIAGNOSIS — Z952 Presence of prosthetic heart valve: Secondary | ICD-10-CM | POA: Diagnosis not present

## 2020-08-15 DIAGNOSIS — J939 Pneumothorax, unspecified: Secondary | ICD-10-CM

## 2020-08-15 DIAGNOSIS — I440 Atrioventricular block, first degree: Secondary | ICD-10-CM | POA: Diagnosis not present

## 2020-08-15 DIAGNOSIS — N170 Acute kidney failure with tubular necrosis: Secondary | ICD-10-CM | POA: Diagnosis present

## 2020-08-15 DIAGNOSIS — D71 Functional disorders of polymorphonuclear neutrophils: Secondary | ICD-10-CM | POA: Diagnosis present

## 2020-08-15 DIAGNOSIS — Z20822 Contact with and (suspected) exposure to covid-19: Secondary | ICD-10-CM | POA: Diagnosis present

## 2020-08-15 DIAGNOSIS — Z79899 Other long term (current) drug therapy: Secondary | ICD-10-CM

## 2020-08-15 DIAGNOSIS — Z833 Family history of diabetes mellitus: Secondary | ICD-10-CM

## 2020-08-15 DIAGNOSIS — I739 Peripheral vascular disease, unspecified: Secondary | ICD-10-CM | POA: Diagnosis present

## 2020-08-15 DIAGNOSIS — Z823 Family history of stroke: Secondary | ICD-10-CM | POA: Diagnosis not present

## 2020-08-15 DIAGNOSIS — Z87891 Personal history of nicotine dependence: Secondary | ICD-10-CM | POA: Diagnosis not present

## 2020-08-15 DIAGNOSIS — R001 Bradycardia, unspecified: Secondary | ICD-10-CM

## 2020-08-15 DIAGNOSIS — I517 Cardiomegaly: Secondary | ICD-10-CM | POA: Diagnosis not present

## 2020-08-15 DIAGNOSIS — J439 Emphysema, unspecified: Secondary | ICD-10-CM | POA: Diagnosis present

## 2020-08-15 DIAGNOSIS — I951 Orthostatic hypotension: Secondary | ICD-10-CM | POA: Diagnosis not present

## 2020-08-15 DIAGNOSIS — I898 Other specified noninfective disorders of lymphatic vessels and lymph nodes: Secondary | ICD-10-CM | POA: Diagnosis present

## 2020-08-15 DIAGNOSIS — K8020 Calculus of gallbladder without cholecystitis without obstruction: Secondary | ICD-10-CM | POA: Diagnosis present

## 2020-08-15 DIAGNOSIS — E785 Hyperlipidemia, unspecified: Secondary | ICD-10-CM | POA: Diagnosis present

## 2020-08-15 DIAGNOSIS — Z887 Allergy status to serum and vaccine status: Secondary | ICD-10-CM

## 2020-08-15 DIAGNOSIS — Z91040 Latex allergy status: Secondary | ICD-10-CM

## 2020-08-15 DIAGNOSIS — Z9689 Presence of other specified functional implants: Secondary | ICD-10-CM

## 2020-08-15 DIAGNOSIS — I701 Atherosclerosis of renal artery: Secondary | ICD-10-CM | POA: Diagnosis present

## 2020-08-15 DIAGNOSIS — I350 Nonrheumatic aortic (valve) stenosis: Secondary | ICD-10-CM | POA: Diagnosis present

## 2020-08-15 DIAGNOSIS — J9811 Atelectasis: Secondary | ICD-10-CM | POA: Diagnosis not present

## 2020-08-15 DIAGNOSIS — I6523 Occlusion and stenosis of bilateral carotid arteries: Secondary | ICD-10-CM | POA: Diagnosis not present

## 2020-08-15 DIAGNOSIS — J9382 Other air leak: Secondary | ICD-10-CM | POA: Diagnosis not present

## 2020-08-15 DIAGNOSIS — J90 Pleural effusion, not elsewhere classified: Secondary | ICD-10-CM | POA: Diagnosis not present

## 2020-08-15 DIAGNOSIS — I447 Left bundle-branch block, unspecified: Secondary | ICD-10-CM | POA: Diagnosis present

## 2020-08-15 DIAGNOSIS — Z953 Presence of xenogenic heart valve: Secondary | ICD-10-CM | POA: Diagnosis not present

## 2020-08-15 DIAGNOSIS — I700 Atherosclerosis of aorta: Secondary | ICD-10-CM | POA: Diagnosis present

## 2020-08-15 DIAGNOSIS — I480 Paroxysmal atrial fibrillation: Secondary | ICD-10-CM

## 2020-08-15 DIAGNOSIS — I9581 Postprocedural hypotension: Secondary | ICD-10-CM | POA: Diagnosis not present

## 2020-08-15 DIAGNOSIS — E7800 Pure hypercholesterolemia, unspecified: Secondary | ICD-10-CM | POA: Diagnosis not present

## 2020-08-15 DIAGNOSIS — I10 Essential (primary) hypertension: Secondary | ICD-10-CM | POA: Diagnosis present

## 2020-08-15 DIAGNOSIS — Z7901 Long term (current) use of anticoagulants: Secondary | ICD-10-CM

## 2020-08-15 DIAGNOSIS — J984 Other disorders of lung: Secondary | ICD-10-CM | POA: Diagnosis not present

## 2020-08-15 DIAGNOSIS — I959 Hypotension, unspecified: Secondary | ICD-10-CM | POA: Diagnosis not present

## 2020-08-15 DIAGNOSIS — R918 Other nonspecific abnormal finding of lung field: Secondary | ICD-10-CM | POA: Diagnosis not present

## 2020-08-15 DIAGNOSIS — Z8249 Family history of ischemic heart disease and other diseases of the circulatory system: Secondary | ICD-10-CM | POA: Diagnosis not present

## 2020-08-15 DIAGNOSIS — I2510 Atherosclerotic heart disease of native coronary artery without angina pectoris: Secondary | ICD-10-CM | POA: Diagnosis present

## 2020-08-15 DIAGNOSIS — C7A8 Other malignant neuroendocrine tumors: Secondary | ICD-10-CM | POA: Diagnosis not present

## 2020-08-15 DIAGNOSIS — R531 Weakness: Secondary | ICD-10-CM | POA: Diagnosis not present

## 2020-08-15 DIAGNOSIS — M1990 Unspecified osteoarthritis, unspecified site: Secondary | ICD-10-CM | POA: Diagnosis present

## 2020-08-15 DIAGNOSIS — Z8673 Personal history of transient ischemic attack (TIA), and cerebral infarction without residual deficits: Secondary | ICD-10-CM

## 2020-08-15 DIAGNOSIS — K219 Gastro-esophageal reflux disease without esophagitis: Secondary | ICD-10-CM | POA: Diagnosis present

## 2020-08-15 DIAGNOSIS — Z91048 Other nonmedicinal substance allergy status: Secondary | ICD-10-CM

## 2020-08-15 DIAGNOSIS — Z902 Acquired absence of lung [part of]: Secondary | ICD-10-CM

## 2020-08-15 DIAGNOSIS — Z791 Long term (current) use of non-steroidal anti-inflammatories (NSAID): Secondary | ICD-10-CM

## 2020-08-15 DIAGNOSIS — R609 Edema, unspecified: Secondary | ICD-10-CM | POA: Diagnosis not present

## 2020-08-15 HISTORY — DX: Atherosclerosis of renal artery: I70.1

## 2020-08-15 HISTORY — PX: INTERCOSTAL NERVE BLOCK: SHX5021

## 2020-08-15 HISTORY — DX: Atrioventricular block, first degree: I44.0

## 2020-08-15 HISTORY — PX: NODE DISSECTION: SHX5269

## 2020-08-15 HISTORY — DX: Stricture of artery: I77.1

## 2020-08-15 LAB — TSH: TSH: 0.529 u[IU]/mL (ref 0.350–4.500)

## 2020-08-15 LAB — APTT: aPTT: 25 seconds (ref 24–36)

## 2020-08-15 LAB — BASIC METABOLIC PANEL
Anion gap: 7 (ref 5–15)
BUN: 25 mg/dL — ABNORMAL HIGH (ref 8–23)
CO2: 23 mmol/L (ref 22–32)
Calcium: 8.2 mg/dL — ABNORMAL LOW (ref 8.9–10.3)
Chloride: 105 mmol/L (ref 98–111)
Creatinine, Ser: 1.48 mg/dL — ABNORMAL HIGH (ref 0.44–1.00)
GFR, Estimated: 36 mL/min — ABNORMAL LOW (ref >60)
Glucose, Bld: 159 mg/dL — ABNORMAL HIGH (ref 70–99)
Potassium: 4.5 mmol/L (ref 3.5–5.1)
Sodium: 135 mmol/L (ref 135–145)

## 2020-08-15 LAB — MAGNESIUM: Magnesium: 1.5 mg/dL — ABNORMAL LOW (ref 1.7–2.4)

## 2020-08-15 LAB — SARS CORONAVIRUS 2 BY RT PCR (HOSPITAL ORDER, PERFORMED IN ~~LOC~~ HOSPITAL LAB): SARS Coronavirus 2: NEGATIVE

## 2020-08-15 LAB — PROTIME-INR
INR: 1.5 — ABNORMAL HIGH (ref 0.8–1.2)
Prothrombin Time: 17.8 seconds — ABNORMAL HIGH (ref 11.4–15.2)

## 2020-08-15 SURGERY — LOBECTOMY, LUNG, ROBOT-ASSISTED, USING VATS
Anesthesia: General | Site: Chest | Laterality: Right

## 2020-08-15 MED ORDER — OXYCODONE HCL 5 MG/5ML PO SOLN: 5.0000 mg | Freq: Once | ORAL | Status: DC | PRN

## 2020-08-15 MED ORDER — HYDRALAZINE HCL 25 MG PO TABS
25.0000 mg | ORAL_TABLET | Freq: Three times a day (TID) | ORAL | Status: DC
  Administered 2020-08-15 – 2020-08-17 (×5): 25 mg via ORAL
  Filled 2020-08-15 (×7): qty 1

## 2020-08-15 MED ORDER — FENTANYL CITRATE (PF) 250 MCG/5ML IJ SOLN
INTRAMUSCULAR | Status: DC | PRN
  Administered 2020-08-15: 100 ug via INTRAVENOUS
  Administered 2020-08-15: 50 ug via INTRAVENOUS

## 2020-08-15 MED ORDER — PROPOFOL 10 MG/ML IV BOLUS
INTRAVENOUS | Status: AC
  Filled 2020-08-15: qty 20

## 2020-08-15 MED ORDER — 0.9 % SODIUM CHLORIDE (POUR BTL) OPTIME
TOPICAL | Status: DC | PRN
  Administered 2020-08-15 (×2): 1000 mL

## 2020-08-15 MED ORDER — ONDANSETRON HCL 4 MG/2ML IJ SOLN: 4.0000 mg | Freq: Once | INTRAMUSCULAR | Status: DC | PRN

## 2020-08-15 MED ORDER — SODIUM CHLORIDE 0.9% IV SOLUTION
INTRAVENOUS | Status: AC | PRN
  Administered 2020-08-15: 1000 mL via INTRAMUSCULAR

## 2020-08-15 MED ORDER — THYROID 60 MG PO TABS
90.0000 mg | ORAL_TABLET | Freq: Every day | ORAL | Status: DC
  Administered 2020-08-16 – 2020-08-27 (×12): 90 mg via ORAL
  Filled 2020-08-15 (×12): qty 1

## 2020-08-15 MED ORDER — GLYCOPYRROLATE PF 0.2 MG/ML IJ SOSY
PREFILLED_SYRINGE | INTRAMUSCULAR | Status: DC | PRN
  Administered 2020-08-15 (×2): .1 mg via INTRAVENOUS

## 2020-08-15 MED ORDER — PHENYLEPHRINE 40 MCG/ML (10ML) SYRINGE FOR IV PUSH (FOR BLOOD PRESSURE SUPPORT)
PREFILLED_SYRINGE | INTRAVENOUS | Status: AC
  Filled 2020-08-15: qty 10

## 2020-08-15 MED ORDER — SUGAMMADEX SODIUM 200 MG/2ML IV SOLN
INTRAVENOUS | Status: DC | PRN
  Administered 2020-08-15: 200 mg via INTRAVENOUS

## 2020-08-15 MED ORDER — MELOXICAM 7.5 MG PO TABS
15.0000 mg | ORAL_TABLET | Freq: Every day | ORAL | Status: DC
  Administered 2020-08-16: 15 mg via ORAL
  Filled 2020-08-15 (×2): qty 2

## 2020-08-15 MED ORDER — MIDAZOLAM HCL 2 MG/2ML IJ SOLN: 1.0000 mg | Freq: Once | INTRAMUSCULAR | Status: AC

## 2020-08-15 MED ORDER — ESTRADIOL 1 MG PO TABS
2.0000 mg | ORAL_TABLET | Freq: Every day | ORAL | Status: DC
  Administered 2020-08-16 – 2020-08-27 (×12): 2 mg via ORAL
  Filled 2020-08-15: qty 1
  Filled 2020-08-15 (×12): qty 2

## 2020-08-15 MED ORDER — GLYCOPYRROLATE PF 0.2 MG/ML IJ SOSY
PREFILLED_SYRINGE | INTRAMUSCULAR | Status: AC
  Filled 2020-08-15: qty 1

## 2020-08-15 MED ORDER — EPHEDRINE 5 MG/ML INJ
INTRAVENOUS | Status: AC
  Filled 2020-08-15: qty 20

## 2020-08-15 MED ORDER — EPINEPHRINE HCL 5 MG/250ML IV SOLN IN NS
0.5000 ug/min | INTRAVENOUS | Status: DC
  Administered 2020-08-15: 1 ug/min via INTRAVENOUS
  Filled 2020-08-15: qty 250

## 2020-08-15 MED ORDER — OXYCODONE HCL 5 MG PO TABS: 5.0000 mg | ORAL_TABLET | Freq: Once | ORAL | Status: DC | PRN

## 2020-08-15 MED ORDER — LIDOCAINE HCL (PF) 2 % IJ SOLN
INTRAMUSCULAR | Status: DC | PRN
  Administered 2020-08-15: 80 mg via INTRADERMAL

## 2020-08-15 MED ORDER — CEFAZOLIN SODIUM-DEXTROSE 2-4 GM/100ML-% IV SOLN
2.0000 g | INTRAVENOUS | Status: AC
  Administered 2020-08-15: 2 g via INTRAVENOUS
  Filled 2020-08-15: qty 100

## 2020-08-15 MED ORDER — ORAL CARE MOUTH RINSE: 15.0000 mL | Freq: Once | OROMUCOSAL | Status: AC

## 2020-08-15 MED ORDER — ROCURONIUM BROMIDE 10 MG/ML (PF) SYRINGE
PREFILLED_SYRINGE | INTRAVENOUS | Status: DC | PRN
  Administered 2020-08-15: 30 mg via INTRAVENOUS
  Administered 2020-08-15: 70 mg via INTRAVENOUS

## 2020-08-15 MED ORDER — FENTANYL CITRATE (PF) 100 MCG/2ML IJ SOLN
25.0000 ug | INTRAMUSCULAR | Status: DC | PRN
  Administered 2020-08-15: 50 ug via INTRAVENOUS

## 2020-08-15 MED ORDER — ONDANSETRON HCL 4 MG/2ML IJ SOLN
INTRAMUSCULAR | Status: AC
  Filled 2020-08-15: qty 2

## 2020-08-15 MED ORDER — ROCURONIUM BROMIDE 10 MG/ML (PF) SYRINGE
PREFILLED_SYRINGE | INTRAVENOUS | Status: AC
  Filled 2020-08-15: qty 10

## 2020-08-15 MED ORDER — ACETAMINOPHEN 160 MG/5ML PO SOLN: 1000.0000 mg | Freq: Four times a day (QID) | ORAL | Status: AC

## 2020-08-15 MED ORDER — ACETAMINOPHEN 500 MG PO TABS
1000.0000 mg | ORAL_TABLET | Freq: Four times a day (QID) | ORAL | Status: AC
  Administered 2020-08-15 – 2020-08-20 (×16): 1000 mg via ORAL
  Filled 2020-08-15 (×17): qty 2

## 2020-08-15 MED ORDER — PROPOFOL 10 MG/ML IV BOLUS
INTRAVENOUS | Status: DC | PRN
  Administered 2020-08-15: 20 mg via INTRAVENOUS
  Administered 2020-08-15: 150 mg via INTRAVENOUS

## 2020-08-15 MED ORDER — CHLORTHALIDONE 25 MG PO TABS: 12.5000 mg | ORAL_TABLET | Freq: Every day | ORAL | Status: DC

## 2020-08-15 MED ORDER — SENNOSIDES-DOCUSATE SODIUM 8.6-50 MG PO TABS
1.0000 | ORAL_TABLET | Freq: Every day | ORAL | Status: DC
  Administered 2020-08-15 – 2020-08-20 (×4): 1 via ORAL
  Filled 2020-08-15 (×7): qty 1

## 2020-08-15 MED ORDER — APREPITANT 40 MG PO CAPS
40.0000 mg | ORAL_CAPSULE | Freq: Once | ORAL | Status: AC
  Administered 2020-08-15: 40 mg via ORAL
  Filled 2020-08-15: qty 1

## 2020-08-15 MED ORDER — HYDRALAZINE HCL 25 MG PO TABS: 25.0000 mg | ORAL_TABLET | Freq: Every day | ORAL | Status: DC

## 2020-08-15 MED ORDER — ONDANSETRON HCL 4 MG/2ML IJ SOLN: 4.0000 mg | Freq: Four times a day (QID) | INTRAMUSCULAR | Status: DC | PRN

## 2020-08-15 MED ORDER — SUCCINYLCHOLINE CHLORIDE 200 MG/10ML IV SOSY
PREFILLED_SYRINGE | INTRAVENOUS | Status: AC
  Filled 2020-08-15: qty 10

## 2020-08-15 MED ORDER — BUPIVACAINE LIPOSOME 1.3 % IJ SUSP
INTRAMUSCULAR | Status: AC
  Filled 2020-08-15: qty 20

## 2020-08-15 MED ORDER — TRAMADOL HCL 50 MG PO TABS
50.0000 mg | ORAL_TABLET | Freq: Four times a day (QID) | ORAL | Status: DC | PRN
  Administered 2020-08-15 – 2020-08-16 (×2): 100 mg via ORAL
  Administered 2020-08-18 – 2020-08-19 (×2): 50 mg via ORAL
  Filled 2020-08-15: qty 2
  Filled 2020-08-15: qty 1
  Filled 2020-08-15 (×2): qty 2

## 2020-08-15 MED ORDER — FENTANYL CITRATE (PF) 100 MCG/2ML IJ SOLN
INTRAMUSCULAR | Status: AC
  Filled 2020-08-15: qty 2

## 2020-08-15 MED ORDER — AMIODARONE HCL 200 MG PO TABS: 200.0000 mg | ORAL_TABLET | Freq: Every day | ORAL | Status: DC

## 2020-08-15 MED ORDER — METOPROLOL SUCCINATE ER 25 MG PO TB24: 25.0000 mg | ORAL_TABLET | Freq: Every day | ORAL | Status: DC

## 2020-08-15 MED ORDER — LACTATED RINGERS IV SOLN: INTRAVENOUS | Status: DC

## 2020-08-15 MED ORDER — HEMOSTATIC AGENTS (NO CHARGE) OPTIME
TOPICAL | Status: DC | PRN
  Administered 2020-08-15 (×3): 1 via TOPICAL

## 2020-08-15 MED ORDER — FENTANYL CITRATE (PF) 100 MCG/2ML IJ SOLN: 50.0000 ug | Freq: Once | INTRAMUSCULAR | Status: AC

## 2020-08-15 MED ORDER — ROSUVASTATIN CALCIUM 20 MG PO TABS
20.0000 mg | ORAL_TABLET | Freq: Every day | ORAL | Status: DC
  Administered 2020-08-15 – 2020-08-26 (×12): 20 mg via ORAL
  Filled 2020-08-15 (×12): qty 1

## 2020-08-15 MED ORDER — CEFAZOLIN SODIUM-DEXTROSE 2-4 GM/100ML-% IV SOLN
2.0000 g | Freq: Three times a day (TID) | INTRAVENOUS | Status: AC
  Administered 2020-08-15 – 2020-08-16 (×2): 2 g via INTRAVENOUS
  Filled 2020-08-15 (×2): qty 100

## 2020-08-15 MED ORDER — OXYCODONE HCL 5 MG PO TABS
5.0000 mg | ORAL_TABLET | ORAL | Status: DC | PRN
Start: 2020-08-15 — End: 2020-08-27
  Administered 2020-08-15 – 2020-08-16 (×2): 10 mg via ORAL
  Administered 2020-08-16: 5 mg via ORAL
  Administered 2020-08-16 – 2020-08-25 (×12): 10 mg via ORAL
  Filled 2020-08-15 (×15): qty 2

## 2020-08-15 MED ORDER — SODIUM CHLORIDE 0.9% IV SOLUTION: Freq: Once | INTRAVENOUS | Status: DC

## 2020-08-15 MED ORDER — SODIUM CHLORIDE 0.9 % IV SOLN: INTRAVENOUS | Status: DC

## 2020-08-15 MED ORDER — DEXAMETHASONE SODIUM PHOSPHATE 10 MG/ML IJ SOLN
INTRAMUSCULAR | Status: AC
  Filled 2020-08-15: qty 1

## 2020-08-15 MED ORDER — DILTIAZEM HCL ER COATED BEADS 120 MG PO CP24: 120.0000 mg | ORAL_CAPSULE | Freq: Every day | ORAL | Status: DC

## 2020-08-15 MED ORDER — ONDANSETRON HCL 4 MG/2ML IJ SOLN
INTRAMUSCULAR | Status: DC | PRN
  Administered 2020-08-15: 4 mg via INTRAVENOUS

## 2020-08-15 MED ORDER — BUPIVACAINE HCL (PF) 0.5 % IJ SOLN
INTRAMUSCULAR | Status: AC
  Filled 2020-08-15: qty 30

## 2020-08-15 MED ORDER — LACTATED RINGERS IV SOLN: INTRAVENOUS | Status: DC | PRN

## 2020-08-15 MED ORDER — EPHEDRINE SULFATE-NACL 50-0.9 MG/10ML-% IV SOSY
PREFILLED_SYRINGE | INTRAVENOUS | Status: DC | PRN
  Administered 2020-08-15 (×3): 5 mg via INTRAVENOUS
  Administered 2020-08-15 (×2): 10 mg via INTRAVENOUS
  Administered 2020-08-15: 5 mg via INTRAVENOUS

## 2020-08-15 MED ORDER — SODIUM CHLORIDE FLUSH 0.9 % IV SOLN
INTRAVENOUS | Status: DC | PRN
  Administered 2020-08-15: 95 mL

## 2020-08-15 MED ORDER — FENTANYL CITRATE (PF) 250 MCG/5ML IJ SOLN
INTRAMUSCULAR | Status: AC
  Filled 2020-08-15: qty 5

## 2020-08-15 MED ORDER — MIDAZOLAM HCL 2 MG/2ML IJ SOLN
INTRAMUSCULAR | Status: AC
  Administered 2020-08-15: 1 mg via INTRAVENOUS
  Filled 2020-08-15: qty 2

## 2020-08-15 MED ORDER — CHLORHEXIDINE GLUCONATE CLOTH 2 % EX PADS
6.0000 | MEDICATED_PAD | Freq: Every day | CUTANEOUS | Status: DC
  Administered 2020-08-16 – 2020-08-17 (×2): 6 via TOPICAL

## 2020-08-15 MED ORDER — BISACODYL 5 MG PO TBEC
10.0000 mg | DELAYED_RELEASE_TABLET | Freq: Every day | ORAL | Status: DC
  Administered 2020-08-16 – 2020-08-17 (×2): 10 mg via ORAL
  Filled 2020-08-15 (×8): qty 2

## 2020-08-15 MED ORDER — DEXAMETHASONE SODIUM PHOSPHATE 10 MG/ML IJ SOLN
INTRAMUSCULAR | Status: DC | PRN
  Administered 2020-08-15: 10 mg via INTRAVENOUS

## 2020-08-15 MED ORDER — FENTANYL CITRATE (PF) 100 MCG/2ML IJ SOLN
INTRAMUSCULAR | Status: AC
  Administered 2020-08-15: 50 ug via INTRAVENOUS
  Filled 2020-08-15: qty 2

## 2020-08-15 MED ORDER — PHENYLEPHRINE HCL-NACL 10-0.9 MG/250ML-% IV SOLN
INTRAVENOUS | Status: DC | PRN
  Administered 2020-08-15: 30 ug/min via INTRAVENOUS

## 2020-08-15 MED ORDER — CHLORHEXIDINE GLUCONATE 0.12 % MT SOLN
15.0000 mL | Freq: Once | OROMUCOSAL | Status: AC
  Administered 2020-08-15: 15 mL via OROMUCOSAL
  Filled 2020-08-15: qty 15

## 2020-08-15 MED ORDER — ATROPINE SULFATE 0.4 MG/ML IJ SOLN
INTRAMUSCULAR | Status: DC | PRN
  Administered 2020-08-15 (×2): .4 mg via INTRAVENOUS

## 2020-08-15 MED ORDER — LIDOCAINE HCL (PF) 2 % IJ SOLN
INTRAMUSCULAR | Status: AC
  Filled 2020-08-15: qty 5

## 2020-08-15 MED ORDER — PANTOPRAZOLE SODIUM 40 MG PO TBEC
40.0000 mg | DELAYED_RELEASE_TABLET | Freq: Every day | ORAL | Status: DC
  Administered 2020-08-16 – 2020-08-26 (×10): 40 mg via ORAL
  Filled 2020-08-15 (×10): qty 1

## 2020-08-15 MED FILL — Sodium Chloride IV Soln 0.9%: INTRAVENOUS | Qty: 250 | Status: AC

## 2020-08-15 MED FILL — Epinephrine Inj 30 MG/30ML (1 MG/ML) (1:1000): INTRAMUSCULAR | Qty: 5 | Status: AC

## 2020-08-15 SURGICAL SUPPLY — 133 items
ADH SKN CLS APL DERMABOND .7 (GAUZE/BANDAGES/DRESSINGS) ×1
BAG SPEC RTRVL C1550 15 (MISCELLANEOUS) ×1
BAG TISS RTRVL C300 12X14 (MISCELLANEOUS) ×1
BLADE CLIPPER SURG (BLADE) IMPLANT
CANISTER SUCT 3000ML PPV (MISCELLANEOUS) ×4 IMPLANT
CANNULA REDUC XI 12-8 STAPL (CANNULA) ×4
CANNULA REDUCER 12-8 DVNC XI (CANNULA) ×2 IMPLANT
CATH FOLEY LATEX FREE 16FR (CATHETERS) ×2
CATH FOLEY LF 16FR (CATHETERS) IMPLANT
CATH THORACIC 28FR (CATHETERS) IMPLANT
CATH THORACIC 28FR RT ANG (CATHETERS) IMPLANT
CATH THORACIC 36FR (CATHETERS) IMPLANT
CATH THORACIC 36FR RT ANG (CATHETERS) IMPLANT
CLIP VESOCCLUDE MED 6/CT (CLIP) IMPLANT
CNTNR URN SCR LID CUP LEK RST (MISCELLANEOUS) ×5 IMPLANT
CONN ST 1/4X3/8  BEN (MISCELLANEOUS)
CONN ST 1/4X3/8 BEN (MISCELLANEOUS) IMPLANT
CONN Y 3/8X3/8X3/8  BEN (MISCELLANEOUS)
CONN Y 3/8X3/8X3/8 BEN (MISCELLANEOUS) IMPLANT
CONT SPEC 4OZ STRL OR WHT (MISCELLANEOUS) ×32
DEFOGGER SCOPE WARMER CLEARIFY (MISCELLANEOUS) ×2 IMPLANT
DERMABOND ADVANCED (GAUZE/BANDAGES/DRESSINGS) ×1
DERMABOND ADVANCED .7 DNX12 (GAUZE/BANDAGES/DRESSINGS) ×1 IMPLANT
DRAIN CHANNEL 28F RND 3/8 FF (WOUND CARE) IMPLANT
DRAIN CHANNEL 32F RND 10.7 FF (WOUND CARE) IMPLANT
DRAPE ARM DVNC X/XI (DISPOSABLE) ×4 IMPLANT
DRAPE COLUMN DVNC XI (DISPOSABLE) ×1 IMPLANT
DRAPE CV SPLIT W-CLR ANES SCRN (DRAPES) ×2 IMPLANT
DRAPE DA VINCI XI ARM (DISPOSABLE) ×8
DRAPE DA VINCI XI COLUMN (DISPOSABLE) ×2
DRAPE HALF SHEET 40X57 (DRAPES) ×3 IMPLANT
DRAPE INCISE IOBAN 66X45 STRL (DRAPES) IMPLANT
DRAPE ORTHO SPLIT 77X108 STRL (DRAPES) ×2
DRAPE SURG ORHT 6 SPLT 77X108 (DRAPES) ×1 IMPLANT
ELECT BLADE 6.5 EXT (BLADE) ×2 IMPLANT
ELECT REM PT RETURN 9FT ADLT (ELECTROSURGICAL) ×2
ELECTRODE REM PT RTRN 9FT ADLT (ELECTROSURGICAL) ×1 IMPLANT
GAUZE KITTNER 4X5 RF (MISCELLANEOUS) ×6 IMPLANT
GAUZE SPONGE 4X4 12PLY STRL (GAUZE/BANDAGES/DRESSINGS) ×2 IMPLANT
GAUZE SPONGE 4X4 12PLY STRL LF (GAUZE/BANDAGES/DRESSINGS) ×1 IMPLANT
GLOVE SURG POLYISO LF SZ8 (GLOVE) ×2 IMPLANT
GLOVE SURG SS PI 8.0 STRL IVOR (GLOVE) ×2 IMPLANT
GLOVE SURG SYN 7.5  E (GLOVE)
GLOVE SURG SYN 7.5 E (GLOVE) IMPLANT
GLOVE SURG SYN 7.5 PF PI (GLOVE) IMPLANT
GLOVE SURG UNDER POLY LF SZ6 (GLOVE) ×1 IMPLANT
GLOVE SURG UNDER POLY LF SZ6.5 (GLOVE) ×3 IMPLANT
GLOVE TRIUMPH SURG SIZE 7.5 (KITS) IMPLANT
GOWN STRL REUS W/ TWL LRG LVL3 (GOWN DISPOSABLE) ×2 IMPLANT
GOWN STRL REUS W/ TWL XL LVL3 (GOWN DISPOSABLE) ×2 IMPLANT
GOWN STRL REUS W/TWL 2XL LVL3 (GOWN DISPOSABLE) ×2 IMPLANT
GOWN STRL REUS W/TWL LRG LVL3 (GOWN DISPOSABLE) ×6
GOWN STRL REUS W/TWL XL LVL3 (GOWN DISPOSABLE) ×4
HANDLE STAPLE  ENDO EGIA 4 STD (STAPLE) ×2
HANDLE STAPLE ENDO EGIA 4 STD (STAPLE) IMPLANT
HEMOSTAT SURGICEL 2X14 (HEMOSTASIS) ×6 IMPLANT
IRRIGATION STRYKERFLOW (MISCELLANEOUS) ×1 IMPLANT
IRRIGATOR STRYKERFLOW (MISCELLANEOUS) ×2
KIT BASIN OR (CUSTOM PROCEDURE TRAY) ×2 IMPLANT
KIT SUCTION CATH 14FR (SUCTIONS) IMPLANT
KIT TURNOVER KIT B (KITS) ×2 IMPLANT
LOOP VESSEL SUPERMAXI WHITE (MISCELLANEOUS) IMPLANT
MARKER SKIN DUAL TIP RULER LAB (MISCELLANEOUS) ×1 IMPLANT
NDL HYPO 25GX1X1/2 BEV (NEEDLE) ×1 IMPLANT
NDL SPNL 22GX3.5 QUINCKE BK (NEEDLE) ×1 IMPLANT
NEEDLE HYPO 25GX1X1/2 BEV (NEEDLE) ×2 IMPLANT
NEEDLE SPNL 22GX3.5 QUINCKE BK (NEEDLE) ×2 IMPLANT
NS IRRIG 1000ML POUR BTL (IV SOLUTION) ×4 IMPLANT
PACK CHEST (CUSTOM PROCEDURE TRAY) ×2 IMPLANT
PAD ARMBOARD 7.5X6 YLW CONV (MISCELLANEOUS) ×4 IMPLANT
PORT ACCESS TROCAR AIRSEAL 12 (TROCAR) ×1 IMPLANT
PORT ACCESS TROCAR AIRSEAL 5M (TROCAR) ×1
RELOAD EGIA 45 MED/THCK PURPLE (STAPLE) ×3 IMPLANT
RELOAD STAPLE 45 2.0 GRY DVNC (STAPLE) IMPLANT
RELOAD STAPLE 45 2.5 WHT DVNC (STAPLE) IMPLANT
RELOAD STAPLE 45 3.5 BLU DVNC (STAPLE) IMPLANT
RELOAD STAPLE 45 4.3 GRN DVNC (STAPLE) IMPLANT
RELOAD STAPLER 2.5X45 WHT DVNC (STAPLE) ×5 IMPLANT
RELOAD STAPLER 3.5X45 BLU DVNC (STAPLE) ×10 IMPLANT
RELOAD STAPLER 4.3X45 GRN DVNC (STAPLE) ×1 IMPLANT
SCISSORS LAP 5X35 DISP (ENDOMECHANICALS) IMPLANT
SEAL CANN UNIV 5-8 DVNC XI (MISCELLANEOUS) ×2 IMPLANT
SEAL XI 5MM-8MM UNIVERSAL (MISCELLANEOUS) ×4
SEALANT PROGEL (MISCELLANEOUS) IMPLANT
SEALANT SURG COSEAL 4ML (VASCULAR PRODUCTS) IMPLANT
SEALANT SURG COSEAL 8ML (VASCULAR PRODUCTS) IMPLANT
SEALER SYNCHRO 8 IS4000 DV (MISCELLANEOUS) ×2
SEALER SYNCHRO 8 IS4000 DVNC (MISCELLANEOUS) IMPLANT
SET TRI-LUMEN FLTR TB AIRSEAL (TUBING) ×2 IMPLANT
SHEARS HARMONIC HDI 20CM (ELECTROSURGICAL) IMPLANT
SOLUTION ELECTROLUBE (MISCELLANEOUS) ×2 IMPLANT
SPONGE INTESTINAL PEANUT (DISPOSABLE) IMPLANT
SPONGE TONSIL TAPE 1 RFD (DISPOSABLE) IMPLANT
STAPLE RELOAD 45 2.0 GRAY (STAPLE) ×2
STAPLE RELOAD 45 2.0 GRAY DVNC (STAPLE) ×1 IMPLANT
STAPLER 45 SUREFORM CVD (STAPLE) ×2
STAPLER 45 SUREFORM CVD DVNC (STAPLE) IMPLANT
STAPLER CANNULA SEAL DVNC XI (STAPLE) ×2 IMPLANT
STAPLER CANNULA SEAL XI (STAPLE) ×4
STAPLER RELOAD 2.5X45 WHITE (STAPLE) ×10
STAPLER RELOAD 2.5X45 WHT DVNC (STAPLE) ×5
STAPLER RELOAD 3.5X45 BLU DVNC (STAPLE) ×10
STAPLER RELOAD 3.5X45 BLUE (STAPLE) ×20
STAPLER RELOAD 4.3X45 GREEN (STAPLE) ×2
STAPLER RELOAD 4.3X45 GRN DVNC (STAPLE) ×1
SUT PDS AB 3-0 SH 27 (SUTURE) IMPLANT
SUT PROLENE 4 0 RB 1 (SUTURE)
SUT PROLENE 4-0 RB1 .5 CRCL 36 (SUTURE) IMPLANT
SUT SILK  1 MH (SUTURE) ×4
SUT SILK 1 MH (SUTURE) ×1 IMPLANT
SUT SILK 1 TIES 10X30 (SUTURE) ×1 IMPLANT
SUT SILK 2 0 SH (SUTURE) ×2 IMPLANT
SUT SILK 2 0SH CR/8 30 (SUTURE) IMPLANT
SUT SILK 3 0SH CR/8 30 (SUTURE) ×1 IMPLANT
SUT VIC AB 1 CTX 36 (SUTURE)
SUT VIC AB 1 CTX36XBRD ANBCTR (SUTURE) ×1 IMPLANT
SUT VIC AB 2-0 CTX 36 (SUTURE) ×1 IMPLANT
SUT VIC AB 3-0 MH 27 (SUTURE) IMPLANT
SUT VIC AB 3-0 X1 27 (SUTURE) ×4 IMPLANT
SUT VICRYL 0 TIES 12 18 (SUTURE) ×2 IMPLANT
SUT VICRYL 0 UR6 27IN ABS (SUTURE) ×4 IMPLANT
SUT VICRYL 2 TP 1 (SUTURE) IMPLANT
SYR 20CC LL (SYRINGE) ×4 IMPLANT
SYSTEM RETRIEVAL ANCHOR 12 (MISCELLANEOUS) ×1 IMPLANT
SYSTEM RETRIEVAL ANCHOR 15 (MISCELLANEOUS) ×2 IMPLANT
SYSTEM SAHARA CHEST DRAIN ATS (WOUND CARE) ×2 IMPLANT
TAPE CLOTH 4X10 WHT NS (GAUZE/BANDAGES/DRESSINGS) ×2 IMPLANT
TAPE CLOTH SURG 6X10 WHT LF (GAUZE/BANDAGES/DRESSINGS) ×1 IMPLANT
TIP APPLICATOR SPRAY EXTEND 16 (VASCULAR PRODUCTS) IMPLANT
TOWEL GREEN STERILE (TOWEL DISPOSABLE) ×2 IMPLANT
TRAY CATH INTERMITTENT SS 16FR (CATHETERS) IMPLANT
TRAY WAYNE PNEUMOTHORAX 14X18 (TRAY / TRAY PROCEDURE) ×1 IMPLANT
WATER STERILE IRR 1000ML POUR (IV SOLUTION) ×4 IMPLANT

## 2020-08-15 NOTE — Telephone Encounter (Signed)
I did receive this patient's INR result from home.  It appears she is currently in the hospital having surgery.  You can try to call her and I would actually just recommend that she restart her warfarin once she is told to by her doctor that discharged from the hospital, and restart taking her INR weekly when she returns home.  If she has any questions please let me know.

## 2020-08-15 NOTE — Interval H&P Note (Signed)
History and Physical Interval Note: No additional cardiac work up indicated although she is relatively high risk given her history INR 1.5   08/15/2020 9:56 AM  Katelyn [name]  has presented today for surgery, with the diagnosis of RUL MASS.  The various methods of treatment have been discussed with the patient and family. After consideration of risks, benefits and other options for treatment, the patient has consented to  Procedure(s): XI ROBOTIC ASSISTED THORASCOPY-RIGHT UPPER LOBECTOMY (Right) as a surgical intervention.  The patient's history has been reviewed, patient examined, no change in status, stable for surgery.  I have reviewed the patient's chart and labs.  Questions were answered to the patient's satisfaction.     Melrose Nakayama

## 2020-08-15 NOTE — Anesthesia Procedure Notes (Signed)
Procedure Name: Intubation Date/Time: 08/15/2020 10:51 AM Performed by: Daiva Huge, RN Pre-anesthesia Checklist: Patient identified, Emergency Drugs available, Suction available and Patient being monitored Patient Re-evaluated:Patient Re-evaluated prior to induction Oxygen Delivery Method: Circle system utilized Preoxygenation: Pre-oxygenation with 100% oxygen Induction Type: IV induction Ventilation: Mask ventilation without difficulty Laryngoscope Size: Mac and 3 Grade View: Grade I Tube type: Oral Endobronchial tube: Left, EBT position confirmed by fiberoptic bronchoscope and Double lumen EBT and 37 Fr Number of attempts: 1 Airway Equipment and Method: Stylet Placement Confirmation: ETT inserted through vocal cords under direct vision, positive ETCO2 and breath sounds checked- equal and bilateral Secured at: 30 cm Tube secured with: Tape Dental Injury: Teeth and Oropharynx as per pre-operative assessment

## 2020-08-15 NOTE — Anesthesia Procedure Notes (Signed)
Central Venous Catheter Insertion Performed by: Audry Pili, MD, anesthesiologist Start/End6/16/2022 9:14 AM, 08/15/2020 9:21 AM Patient location: Pre-op. Preanesthetic checklist: patient identified, IV checked, risks and benefits discussed, surgical consent, monitors and equipment checked, pre-op evaluation, timeout performed and anesthesia consent Position: Trendelenburg Lidocaine 1% used for infiltration and patient sedated Hand hygiene performed , maximum sterile barriers used  and Seldinger technique used Catheter size: 8 Fr Central line was placed.Double lumen Procedure performed using ultrasound guided technique. Ultrasound Notes:anatomy identified, needle tip was noted to be adjacent to the nerve/plexus identified, no ultrasound evidence of intravascular and/or intraneural injection and image(s) printed for medical record Attempts: 1 Following insertion, line sutured, dressing applied and Biopatch. Post procedure assessment: blood return through all ports, free fluid flow and no air  Patient tolerated the procedure well with no immediate complications.

## 2020-08-15 NOTE — Progress Notes (Signed)
T[facility] Evening Rounds  DOS s/p RUL resection Admitted to 2-H for bradycardia HD stable now; no distress Small AL CXR ok BP (!) 130/57   Pulse (!) 37   Temp (!) 97.3 F (36.3 C)   Resp 13   Ht 5\' 10"  (1.778 m)   Wt 79.8 kg   SpO2 100%   BMI 25.25 kg/m  Responsive, comfortable   Intake/Output Summary (Last 24 hours) at 08/15/2020 1757 Last data filed at 08/15/2020 1526 Gross per 24 hour  Intake 1324 ml  Output 795 ml  Net 529 ml   A/p: doing well Continue beta-blocker "washout" Tiffanye Hartmann Z. Orvan Seen, [place]

## 2020-08-15 NOTE — Progress Notes (Signed)
Dr. Raechel Ache and Dr. Roxan Hockey are aware of today's PT/INR and APTT results.

## 2020-08-15 NOTE — Telephone Encounter (Signed)
Called the patients home number and was leaving a message and her daughter picked up and stated that Lenord Fellers is with her at the hospital and they have her starting her warfarin once she is out of surgery. Daughter verbalized an understanding and will call if they need [place].

## 2020-08-15 NOTE — Brief Op Note (Signed)
08/15/2020  12:23 PM  PATIENT:  Katelyn [name]  80 y.o. female  PRE-OPERATIVE DIAGNOSIS:  Right Upper Lung MASS  POST-OPERATIVE DIAGNOSIS:  Right Upper Lung MASS  PROCEDURE:  Procedure(s):  XI ROBOTIC ASSISTED THORASCOPY  RIGHT UPPER LOBECTOMY (Right)  INTERCOSTAL NERVE BLOCK RIGHT (Right)  LYMPH NODE DISSECTION (Right)  SURGEON:  Surgeon(s) and Role:    Melrose Nakayama, MD - Primary  PHYSICIAN ASSISTANT: Ellwood Handler PA-C, Nicholes Rough PA-C  ANESTHESIA:   general  EBL:  250 mL   BLOOD ADMINISTERED: 2 FFP  DRAINS:  28 Blake Drain, Pigtail Catheter    LOCAL MEDICATIONS USED:  BUPIVICAINE   SPECIMEN:  Source of Specimen:  Right Upper Lobe  DISPOSITION OF SPECIMEN:  PATHOLOGY  COUNTS:  YES  TOURNIQUET:  * No tourniquets in log *  DICTATION: .Dragon Dictation  PLAN OF CARE: Admit to inpatient   PATIENT DISPOSITION:  PACU - hemodynamically stable.   Delay start of Pharmacological VTE agent (>24hrs) due to surgical blood loss or risk of bleeding: yes

## 2020-08-15 NOTE — Consult Note (Addendum)
Cardiology Consultation:   Patient ID: GRACELAND WACHTER MRN: [id number]; DOB: [birth year]  Admit date: 08/15/2020 Date of Consult: 08/15/2020  PCP:  Doree Albee, MD   [facility] Providers Cardiologist:  Shelva Majestic, MD  Cardiology APP:  Almyra Deforest, [place]       Patient Profile:   Katelyn [name] is a 80 y.o. female with a hx of CAD (managed medically), aortic stenosis, PVD s/p aortobifem bypass graft surgery and bilateral fundoplasty by VVS, carotid artery disease (93-26% RICA, 7-12% LICA, LECA >45% by duplex 07/2020), bilateral subclavian stenosis with antegrade flow by duplex 07/2020, renal artery stenosis (>60% L renal artery) by duplex 04/2020, HTN, HLD, paroxysmal atrial fibrillation, bradycardia, LBBB, first degree AVB, stroke who is being seen 08/15/2020 for the evaluation of bradycardia at the request of Dr. Roxan Hockey.  History of Present Illness:   [name] historically follows with Dr. Claiborne Billings. She has a history of CAD last assessed in 07/2014 showing chronically occluded RCA with L-R collaterals, 70% diagonal stenosis, 50% ostial Cx, 40% AV groove Cx. At that time she was found to have progressive severe aortic stenosis. She was felt to be a poor conventional operative candidate and subsequently underwent TAVR 08/2014. She developed post-op atrial fib requiring amiodarone. She was started on Coumadin. She subsequently had bradycardia requiring decrease in her amiodarone and beta blocker. Her atrial fib appears to have recurred in 2018. She did not tolerate titration of her beta blocker at that time due to dizziness. In later visits it appears there atrial fib was paroxysmal, at times in SB in the 50s. It appears she has been largely unaware of her recurrent AF. During OV 10/2018 she was back in atrial fib so Dr. Claiborne Billings initiated amiodarone. In 2021 her metoprolol and diltiazem had to be further reduced due to HR in the 40s.  She was felt to be doing well at last cardiology OV 04/2020.  She  was recently found to have a RUL mass after going evaluation for cough. She has seen oncology with concern for clinical stage T2a N1/2 right upper lobe lung cancer. She underwent thoracoscopy and right upper lobectomy today. She got out of surgery about an hour ago per PACU staff. During/after surgery she was noted to be bradycardic in the 30s-40s with preserved blood pressure and no loss of consciousness. EKG showed SB with LBBB/first degree AVB. She is seen in [place] upon her transfer there. Denies any dizziness or any cardiac symptoms, complains of some right shoulder pain - got 20mcg of fentanyl a short while ago and is somewhat sleepy. BPs have been [id number] systolic. Strips from PACU show episodic junctional rhythm but she is currently sinus bradycardia in the 40s. She is on amiodarone 200mg  daily, Toprol 25mg  daily and diltiazem 120mg  daily.  Recent imaging for mass: - CT scan result: 1. Right upper lobe pulmonary mass measuring 4.0 x 3.2 x 3.8 cm, highly suspicious for primary bronchogenic malignancy. There is surrounding spiculation and ground-glass opacity extending peripherally. Recommend malignancy workup. 2. Small noncalcified mediastinal nodes are all subcentimeter, nonspecific. 3. Small subpleural nodules in the right upper lobe measure up to 5 mm. Clustered lingular nodules measuring up  to 6 mm are nonspecific. There is also a nonspecific ground-glass opacity within the right middle lobe. 4. Sequela of prior granulomatous disease with calcified nodules, mediastinal and right hilar lymph nodes. 5.  Nonspecific 8 mm enhancing focus in the left lobe of the liver. Attention on staging exam is recommended. - MRI  brain negative for metastatic disease - PET scan result: 1. Right upper lobe pulmonary mass measuring 4.0 x 3.2 x 3.8 cm, highly suspicious for primary bronchogenic malignancy. There is surrounding spiculation and ground-glass opacity extending peripherally. Recommend malignancy workup. 2.  Small noncalcified mediastinal nodes are all subcentimeter, nonspecific. 3. Small subpleural nodules in the right upper lobe measure up to 5 mm. Clustered lingular nodules measuring up to 6 mm are nonspecific. There is also a nonspecific ground-glass opacity within the right middle lobe. 4. Sequela of prior granulomatous disease with calcified nodules, mediastinal and right hilar lymph nodes. 5. Nonspecific 8 mm enhancing focus in the left lobe of the liver. Attention on staging exam is recommended.   Past Medical History:  Diagnosis Date   Allergy    rhinitis   Aortic stenosis, severe    a. s/p TAVR 08/2014.   Arthritis    spine and various joints   Carotid artery disease (HCC)    Cataract    Cerebrovascular disease 12/05/2018   Childhood asthma    Chronic bronchitis ([place])        Claudication ([place])    COPD GOLD II 07/18/2020   Coronary artery disease    a. April 2012 which revealed mid RCA occlusion with collaterals, 50% LAD stenosis, 30% circumflex stenosis, and 40% marginal stenosis   First degree AV block    GERD (gastroesophageal reflux disease)    Heart murmur    Hyperlipidemia    Hypertension    Left bundle branch block    LUMBAR RADICULOPATHY, RIGHT    PAF (paroxysmal atrial fibrillation) (HCC)    Peripheral vascular disease ([place])    a. s/p aortobifem bypass graft surgery and bilateral fundoplasty by VVS.   Renal artery stenosis ([place])    a. renal artery stenosis (>60% L renal artery) by duplex 04/2020.   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) 1982 X 2   "a little bit weaker on the left side since" (08/29/2014)   Subclavian artery stenosis (HCC)    a. bilateral subclavian stenosis with antegrade flow by duplex 07/2020    Past Surgical History:  Procedure Laterality Date   AORTO-FEMORAL BYPASS GRAFT  Bilateral 06/27/10   BASAL CELL CARCINOMA EXCISION Left    face   CARDIAC CATHETERIZATION N/A 07/25/2014   Procedure: Right/Left Heart Cath and Coronary Angiography;  Surgeon: Troy Sine, MD;  Location: Home CV LAB;  Service: Cardiovascular;  Laterality: N/A;   CATARACT EXTRACTION, BILATERAL Bilateral 09/29/2016   COLONOSCOPY  2008   Dr. Laural Golden: normal.    DILATION AND CURETTAGE OF UTERUS  "2 or 3"   ESOPHAGOGASTRODUODENOSCOPY N/A 08/18/2017   Dr. Gala Romney: Normal-appearing esophagus status post dilation.  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  Small hiatal hernia.   MALONEY DILATION N/A 08/18/2017   Procedure: Venia Minks DILATION;  Surgeon: Daneil Dolin, MD;  Location: [facility];  Service: Endoscopy;  Laterality: N/A;   MULTIPLE TOOTH EXTRACTIONS     PR VEIN BYPASS GRAFT,AORTO-FEM-POP  06/12/10   TEE WITHOUT CARDIOVERSION N/A 08/31/2014   Procedure: TRANSESOPHAGEAL ECHOCARDIOGRAM (TEE);  Surgeon: Rexene Alberts, MD;  Location: [facility];  Service: Open Heart Surgery;  Laterality: N/A;   TRANSCATHETER AORTIC VALVE REPLACEMENT, TRANSFEMORAL N/A 08/31/2014   Procedure: TRANSCATHETER AORTIC VALVE REPLACEMENT, TRANSFEMORAL approach;  Surgeon: Rexene Alberts, MD;  Location: [facility];  Service: Open Heart Surgery;  Laterality: N/A;   TUBAL LIGATION  1970's   VAGINAL HYSTERECTOMY  1970's   Partial      Home Medications:  Prior to Admission medications   Medication Sig Start Date End Date Taking? Authorizing Provider  amiodarone (PACERONE) 200 MG tablet Take 1 tablet by mouth once daily Patient taking differently: Take 200 mg by mouth daily. 02/20/20  Yes Troy Sine, MD  chlorthalidone (HYGROTON) 25 MG tablet Take 0.5 tablets (12.5 mg total) by mouth daily. 01/22/20 08/08/20 Yes Troy Sine, MD  Cholecalciferol (VITAMIN D3) 50 MCG (2000 UT) capsule Take 2,000 Units by mouth daily.   Yes [provider]  diltiazem (CARDIZEM CD) 120 MG 24 hr capsule Take 1 capsule (120 mg  total) by mouth daily. 01/22/20  Yes Troy Sine, MD  estradiol (ESTRACE) 2 MG tablet Take 1 tablet by mouth once daily Patient taking differently: Take 2 mg by mouth daily. 07/13/20  Yes Gosrani, Nimish C, MD  hydrALAZINE (APRESOLINE) 25 MG tablet Take 1 tablet (25 mg total) by mouth in the morning and at bedtime. Patient taking differently: Take 25 mg by mouth daily. 04/04/20 08/08/20 Yes Troy Sine, MD  ipratropium (ATROVENT) 0.06 % nasal spray USE 1 SPRAY(S) IN EACH NOSTRIL EVERY 6 HOURS AS NEEDED FOR RUNNY NOSE Patient taking differently: Place 1 spray into both nostrils every 6 (six) hours as needed for rhinitis. 05/13/20  Yes Gosrani, Nimish C, MD  irbesartan (AVAPRO) 300 MG tablet Take 1 tablet (300 mg total) by mouth daily. 01/22/20  Yes Troy Sine, MD  meloxicam (MOBIC) 15 MG tablet Take 1 tablet by mouth once daily Patient taking differently: Take 15 mg by mouth daily. 05/20/20  Yes Gosrani, Nimish C, MD  metoprolol succinate (TOPROL XL) 25 MG 24 hr tablet Take 1 tablet (25 mg total) by mouth daily. 03/06/20  Yes Troy Sine, MD  nitroGLYCERIN (NITROSTAT) 0.4 MG SL tablet Place 1 tablet (0.4 mg total) under the tongue every 5 (five) minutes as needed for chest pain. 04/04/20 08/08/20 Yes Troy Sine, MD  NP THYROID 90 MG tablet Take 1 tablet by mouth once daily Patient taking differently: Take 90 mg by mouth daily. 06/03/20 08/08/20 Yes Gosrani, Nimish C, MD  pantoprazole (PROTONIX) 40 MG tablet TAKE 1 TABLET BY MOUTH ONCE DAILY BEFORE SUPPER Patient taking differently: Take 40 mg by mouth daily before supper. 07/16/20  Yes Aliene Altes S, PA-C  rosuvastatin (CRESTOR) 20 MG tablet TAKE 1 TABLET BY MOUTH AT BEDTIME Patient taking differently: Take 20 mg by mouth at bedtime. 05/30/20  Yes Gosrani, Nimish C, MD  warfarin (COUMADIN) 2.5 MG tablet Take 1 tablet by mouth once daily Patient taking differently: Take 2.5 mg by mouth daily. 01/26/20  Yes Doree Albee, MD     Inpatient Medications: Scheduled Meds:  acetaminophen  1,000 mg Oral Q6H   Or   acetaminophen (TYLENOL) oral liquid 160 mg/5 mL  1,000 mg Oral Q6H   [START ON 08/16/2020] bisacodyl  10 mg Oral Daily   [START ON 08/16/2020] chlorthalidone  12.5 mg Oral Daily   [START ON 08/16/2020] estradiol  2 mg Oral Daily   fentaNYL       [START ON 08/16/2020] hydrALAZINE  25 mg Oral Daily   [START ON 08/16/2020] meloxicam  15 mg Oral Q breakfast   [START ON 08/16/2020] pantoprazole  40 mg Oral QAC supper   rosuvastatin  20 mg  Oral QHS   senna-docusate  1 tablet Oral QHS   [START ON 08/16/2020] thyroid  90 mg Oral Daily   Continuous Infusions:  sodium chloride      ceFAZolin (ANCEF) IV     epinephrine Stopped (08/15/20 1420)   PRN Meds: ondansetron (ZOFRAN) IV, oxyCODONE, traMADol  Allergies:    Allergies  Allergen Reactions   Adhesive [Tape] Rash   Latex Rash   Tetanus Toxoids Rash    Social History:   Social History   Socioeconomic History   Marital status: Widowed    Spouse name: bobby   Number of children: 1   Years of education: Not on file   Highest education level: High school graduate  Occupational History   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 Retired,used to work at [employer].Lives alone.         Patient has one daughter, 3 grands   Likes to crochet   Works in [employer].    Social Determinants of Health   Financial Resource Strain: Not on file  Food Insecurity: Not on file   Transportation Needs: Not on file  Physical Activity: Not on file  Stress: Not on file  Social Connections: Not on file  Intimate Partner Violence: Not on file    Family History:   Family History  Problem Relation Age of Onset   Heart disease Mother 39       died young   Varicose Veins Mother    Stroke Father    Hypertension Father    Heart disease Father 87       Aneurysm   Heart attack Father    Arthritis Brother    Stroke Brother    Diabetes Daughter    Arthritis Daughter    Heart attack Brother        unsure   Allergic rhinitis Neg Hx    Angioedema Neg Hx    Asthma Neg Hx    Atopy Neg Hx    Eczema Neg Hx    Immunodeficiency Neg Hx    Urticaria Neg Hx      ROS:  Please see the history of present illness.  All other ROS reviewed and negative.     Physical Exam/Data:   Vitals:   08/15/20 1020 08/15/20 1450 08/15/20 1505 08/15/20 1520  BP: (!) 186/55 (!) 133/51 (!) 137/40 (!) 130/57  Pulse: (!) 45 (!) 35 (!) 34 (!) 34  Resp: 16 16 16 12   Temp:  (!) 97.3 F (36.3 C)  (!) 97.3 F (36.3 C)  TempSrc:      SpO2: 100%  100% 100%  Weight:      Height:        Intake/Output Summary (Last 24 hours) at 08/15/2020 1613 Last data filed at 08/15/2020 1526 Gross per 24 hour  Intake 1324 ml  Output 795 ml  Net  529 ml   Last 3 Weights 08/15/2020 08/13/2020 08/05/2020  Weight (lbs) 176 lb 176 lb 8 oz 176 lb  Weight (kg) 79.833 kg 80.06 kg 79.833 kg     Body mass index is 25.25 kg/m.  General: Well developed, well nourished WF in no acute distress Head: Normocephalic, atraumatic, sclera non-icteric, no xanthomas, nares are without discharge. Neck: JVP not elevated. Lungs: Clear bilaterally to auscultation without wheezes, rales, or rhonchi. Breathing is unlabored. Heart: Bradycardic, regular, S1 S2, + SEM, without rubs, or gallops.  Abdomen: Soft, non-tender, non-distended with normoactive bowel sounds. No rebound/guarding. Extremities: No clubbing or cyanosis. No  edema. Extremities warm Neuro: Alert and oriented X 3. Moves all extremities spontaneously. Psych:  Responds to questions appropriately, somewhat sleepy but alert   EKG:  The EKG was personally reviewed and demonstrates:  SB 37bpm with known LBBB, first degree AVB Telemetry:  Telemetry was personally reviewed and demonstrates:  reviewed above  Relevant CV Studies: LHC 07/2014 Mid RCA lesion, 100% stenosed. Ost 1st Diag lesion, 70% stenosed. Ost Cx lesion, 50% stenosed. Mid Cx lesion, 40% stenosed. Ost RCA lesion, 60% stenosed.   Normal LV function with an ejection fraction at a proximally 55% but with evidence for small focal region of mid to basal inferior            hypocontractility. 2+ angiographic mitral regurgitation with mean palmar E wedge pressure at 19 and V wave at 31 mmHg. Severe aortic valve stenosis with a peak to peak gradient of 46 mmHg, and a mean gradient of 39.5 mmHg.  Calculated aortic valve    area 0.64 cm. 1 - 2+ angiographic aortic regurgitation. Significant native coronary obstructive disease with evidence for coronary calcification and 70% proximal first diagonal stenosis; 50% proximal circumflex stenosis before the first marginal branch with 40% AV groove circumflex stenosis; 60% ostial narrowing of the RCA with 100% mid RCA occlusion after a marginal branch with bridging collaterals supplying the mid RCA extending from this marginal branch as well as left-to-right collaterals from the distal circumflex and LAD to the distal RCA.   RECOMMENDATION: Surgical consultation will be obtained for aortic valve replacement/CABG revascularization surgery.  2D Echo 06/2019  1. Left ventricular ejection fraction, by estimation, is 60 to 65%. The  left ventricle has normal function. The left ventricle has no regional  wall motion abnormalities. There is moderate left ventricular hypertrophy.  Left ventricular diastolic  parameters are indeterminate.   2. Right ventricular  systolic function is normal. The right ventricular  size is normal. There is moderately elevated pulmonary artery systolic  pressure.   3. Left atrial size was moderately dilated.   4. The mitral valve is normal in structure. Mild mitral valve  regurgitation. No evidence of mitral stenosis.   5. Post TAVR with 26 mm Sapien 3 valve stable gradients since 05/19/18 and  no PVL. The aortic valve has been repaired/replaced. Aortic valve  regurgitation is not visualized. No aortic stenosis is present.   6. The inferior vena cava is normal in size with greater than 50%  respiratory variability, suggesting right atrial pressure of 3 mmHg.   Laboratory Data:  High Sensitivity Troponin:  No results for input(s): TROPONINIHS in the last 720 hours.   Chemistry Recent Labs  Lab 08/13/20 1434  NA 135  K 4.6  CL 106  CO2 22  GLUCOSE 103*  BUN 27*  CREATININE 1.86*  CALCIUM 8.9  GFRNONAA 28*  ANIONGAP 7    Recent Labs  Lab 08/13/20 1434  PROT 6.7  ALBUMIN 3.5  AST 20  ALT 16  ALKPHOS 74  BILITOT 0.8   Hematology Recent Labs  Lab 08/13/20 1434  WBC 7.6  RBC 4.25  HGB 13.0  HCT 40.9  MCV 96.2  MCH 30.6  MCHC 31.8  RDW 14.6  PLT 165   BNPNo results for input(s): BNP, PROBNP in the last 168 hours.  DDimer No results for input(s): DDIMER in the last 168 hours.   Radiology/Studies:  DG Chest 2 View  Result Date: 08/14/2020 CLINICAL DATA:  80 year old female under preoperative evaluation prior to right upper lobectomy. EXAM: CHEST - 2 VIEW COMPARISON:  Chest x-ray 06/21/2020. FINDINGS: Again noted is a mass in the right upper lobe estimated to measure approximately 4.5 cm. Adjacent ill-defined opacities and regions of interstitial prominence are noted in the right upper lobe, likely to reflect evolving postobstructive changes. This is associated with elevation of the minor fissure, indicative of a volume loss. Left lung is clear. No pleural effusions. No pneumothorax. No evidence  of pulmonary edema. Heart size is normal. Upper mediastinal contours are within normal limits. Aortic atherosclerosis. Status post TAVR. IMPRESSION: 1. Right upper lobe mass with evolving postobstructive changes, as above. 2. Aortic atherosclerosis. Electronically Signed   By: Vinnie Langton M.D.   On: 08/14/2020 09:35     Assessment and Plan:   1. Bradycardia - both junctional and sinus bradycardia - patient has prior underlying tendency for such, with previous HR in the 40s requiring reduction in AVN blocking agents - she is currently hemodynamically stable at present time without acute complaint aside from shoulder pain, therefore urgent external pacing or temporary wire is not needed - will hold amiodarone, diltiazem and metoprolol for now - check BMET, TSH, Mg - continue to follow VS and clinical status carefully - if any signs of acute hemodynamic decompensation, nurse aware to contact cardiology team - if she has persistent bradycardia despite cessation of AVN blocking agents, will need EP to see to consider PPM  2. Paroxysmal atrial fibrillation - Coumadin on hold due to recent surgery - hold amiodarone, diltiazem, metoprolol - from what I can gather, she has never had rapid atrial fibrillation so hopefully we can strike a balance if she does develop recurrence  3. RUL mass - s/p lobectomy today by CVTS  4. CAD - no angina reported - not on ASA due to concomitant warfarin - hold BB - continue statin  5. Severe AS s/p TAVR 2016 - last echo 06/2019 EF 60-65%, moderate LVH, moderately elevated PASP, moderate LAE, Mild MR, repaired/replaced TAVR with stable gradients - will obtain updated echocardiogram  6. PAD - continue statin  Risk Assessment/Risk Scores:      CHA2DS2-VASc Score = 7  This indicates a 11.2% annual risk of stroke. The patient's score is based upon: CHF History: No HTN History: Yes Diabetes History: No Stroke History: Yes Vascular Disease History:  Yes Age Score: 2 Gender Score: 1    For questions or updates, please contact [place] Please consult [email or web address] for contact info under    Signed, Charlie Pitter, PA-C  08/15/2020 4:13 PM  Patient seen and examined and agree with Melina Copa, PA-C as detailed above.  In brief, the patient is a 80 y.o. female with a hx of CAD (managed medically), aortic stenosis, PVD s/p aortobifem bypass graft surgery and bilateral fundoplasty by VVS, carotid artery disease (14-10% RICA, 3-01% LICA, LECA >31% by duplex 07/2020), bilateral subclavian  stenosis with antegrade flow by duplex 07/2020, renal artery stenosis (>60% L renal artery) by duplex 04/2020, HTN, HLD, paroxysmal atrial fibrillation, bradycardia, LBBB, first degree AVB, stroke who presented for planned thoracoscopy and right upper lobectomy for newly diagnosed RUL lung cancer. Post-op course complicated by junctional bradycardia/sinus bradycardia for which Cardiology has been consulted.  Per review of the record, the patient has had several episodes of bradycardia in the past that improved with adjusting/stopping her BB and amiodarone. These medications have been re-trialed in the setting of AF. Currently, she is on amiodarone 200mg  daily, Toprol 25mg  daily and diltiazem 120mg  daily, which are likely the culprit of her underlying bradycardia. Fortunately, she is HD stable, awake and asymptomatic currently.   GEN: Comfortable, tired but arousable Neck: No JVD Cardiac: Bradycardic, regular, 2/6 systolic murmur, no rubs or gallops Respiratory: Clear to auscultation bilaterally on anterior exam GI: Soft, nontender, non-distended  MS: No edema; No deformity. Neuro:  Nonfocal  Psych: Normal affect   Plan: -Hold amiodarone, metop and diltiazem  -No indication for temporary pacing at this time given overall HD stability; will monitor with wash-out of nodal agents  -If signs of hemodynamic decompensation overnight, will need external pacing and  temporary wire -If persistently bradycardic despite stopping above medications, will need to consider PPM at that time -Warfarin held given recent surgery; resume per CV surgery -Post-op management per CV surgery  Gwyndolyn Kaufman, MD

## 2020-08-15 NOTE — Transfer of Care (Signed)
Immediate Anesthesia Transfer of Care Note  Patient: Katelyn [name]  Procedure(s) Performed: XI ROBOTIC ASSISTED THORASCOPY-RIGHT UPPER LOBECTOMY (Right: Chest) INTERCOSTAL NERVE BLOCK RIGHT (Right: Chest) NODE DISSECTION (Right: Chest)  Patient Location: PACU  Anesthesia Type:General  Level of Consciousness: drowsy  Airway & Oxygen Therapy: Patient Spontanous Breathing and Patient connected to face mask oxygen  Post-op Assessment: Report given to RN and Post -op Vital signs reviewed and stable  Post vital signs: Reviewed and stable  Last Vitals:  Vitals Value Taken Time  BP 133/51 08/15/20 1449  Temp    Pulse 52 08/15/20 1450  Resp 17 08/15/20 1450  SpO2 100 % 08/15/20 1450  Vitals shown include unvalidated device data.  Last Pain:  Vitals:   08/15/20 0832  TempSrc:   PainSc: 0-No pain      Patients Stated Pain Goal: 3 (72/18/28 8337)  Complications: No notable events documented.

## 2020-08-16 ENCOUNTER — Encounter (HOSPITAL_COMMUNITY): Payer: Self-pay | Admitting: Thoracic Surgery (Cardiothoracic Vascular Surgery)

## 2020-08-16 ENCOUNTER — Inpatient Hospital Stay (HOSPITAL_COMMUNITY): Payer: Medicare Other

## 2020-08-16 DIAGNOSIS — I350 Nonrheumatic aortic (valve) stenosis: Secondary | ICD-10-CM

## 2020-08-16 LAB — BPAM FFP
Blood Product Expiration Date: 202206212359
Blood Product Expiration Date: 202206212359
ISSUE DATE / TIME: 202206161343
ISSUE DATE / TIME: 202206161343
Unit Type and Rh: 7300
Unit Type and Rh: 7300

## 2020-08-16 LAB — PREPARE FRESH FROZEN PLASMA
Unit division: 0
Unit division: 0

## 2020-08-16 LAB — CBC
HCT: 34.3 % — ABNORMAL LOW (ref 36.0–46.0)
Hemoglobin: 11 g/dL — ABNORMAL LOW (ref 12.0–15.0)
MCH: 30.7 pg (ref 26.0–34.0)
MCHC: 32.1 g/dL (ref 30.0–36.0)
MCV: 95.8 fL (ref 80.0–100.0)
Platelets: 121 10*3/uL — ABNORMAL LOW (ref 150–400)
RBC: 3.58 MIL/uL — ABNORMAL LOW (ref 3.87–5.11)
RDW: 14.3 % (ref 11.5–15.5)
WBC: 9.9 10*3/uL (ref 4.0–10.5)
nRBC: 0 % (ref 0.0–0.2)

## 2020-08-16 LAB — TYPE AND SCREEN
ABO/RH(D): B POS
Antibody Screen: NEGATIVE

## 2020-08-16 LAB — BASIC METABOLIC PANEL
Anion gap: 6 (ref 5–15)
BUN: 20 mg/dL (ref 8–23)
CO2: 25 mmol/L (ref 22–32)
Calcium: 8.1 mg/dL — ABNORMAL LOW (ref 8.9–10.3)
Chloride: 104 mmol/L (ref 98–111)
Creatinine, Ser: 1.25 mg/dL — ABNORMAL HIGH (ref 0.44–1.00)
GFR, Estimated: 44 mL/min — ABNORMAL LOW (ref >60)
Glucose, Bld: 138 mg/dL — ABNORMAL HIGH (ref 70–99)
Potassium: 4.5 mmol/L (ref 3.5–5.1)
Sodium: 135 mmol/L (ref 135–145)

## 2020-08-16 LAB — GLUCOSE, CAPILLARY: Glucose-Capillary: 159 mg/dL — ABNORMAL HIGH (ref 70–99)

## 2020-08-16 MED ORDER — CHLORTHALIDONE 25 MG PO TABS
25.0000 mg | ORAL_TABLET | Freq: Every day | ORAL | Status: DC
  Administered 2020-08-16: 25 mg via ORAL
  Filled 2020-08-16 (×2): qty 1

## 2020-08-16 MED ORDER — HYDRALAZINE HCL 20 MG/ML IJ SOLN
10.0000 mg | Freq: Once | INTRAMUSCULAR | Status: AC
  Administered 2020-08-16: 10 mg via INTRAVENOUS
  Filled 2020-08-16: qty 1

## 2020-08-16 MED ORDER — MAGNESIUM SULFATE 2 GM/50ML IV SOLN
2.0000 g | Freq: Once | INTRAVENOUS | Status: AC
  Administered 2020-08-16: 2 g via INTRAVENOUS
  Filled 2020-08-16: qty 50

## 2020-08-16 NOTE — Anesthesia Postprocedure Evaluation (Signed)
Anesthesia Post Note  Patient: Katelyn [name]  Procedure(s) Performed: XI ROBOTIC ASSISTED THORASCOPY-RIGHT UPPER LOBECTOMY (Right: Chest) INTERCOSTAL NERVE BLOCK RIGHT (Right: Chest) NODE DISSECTION (Right: Chest)     Patient location during evaluation: PACU Anesthesia Type: General Level of consciousness: awake and alert Pain management: pain level controlled Vital Signs Assessment: post-procedure vital signs reviewed and stable Respiratory status: spontaneous breathing, nonlabored ventilation, respiratory function stable and patient connected to nasal cannula oxygen Cardiovascular status: blood pressure returned to baseline, stable and bradycardic (Cardiology consulted regarding significant bradycardia and possible junctional rhythm during procedure) Postop Assessment: no apparent nausea or vomiting Anesthetic complications: no   No notable events documented.  Last Vitals:  Vitals:   08/16/20 1500 08/16/20 1647  BP: (!) 149/58 (!) 146/56  Pulse: (!) 59 64  Resp: 18 14  Temp:  (!) 36.4 C  SpO2: 100% 100%    Last Pain:  Vitals:   08/16/20 1647  TempSrc: Oral  PainSc:                  Audry Pili

## 2020-08-16 NOTE — Progress Notes (Signed)
1 Day Post-Op Procedure(s) (LRB): XI ROBOTIC ASSISTED THORASCOPY-RIGHT UPPER LOBECTOMY (Right) INTERCOSTAL NERVE BLOCK RIGHT (Right) NODE DISSECTION (Right) Subjective: C/o pain under right shoulder blade, well controlled  Objective: Vital signs in last 24 hours: Temp:  [97.2 F (36.2 C)-99.2 F (37.3 C)] 97.2 F (36.2 C) (06/17 0755) Pulse Rate:  [34-61] 53 (06/17 0800) Cardiac Rhythm: Sinus bradycardia;Bundle branch block;Heart block (06/17 0400) Resp:  [11-24] 14 (06/17 0800) BP: (101-188)/(40-67) 101/46 (06/17 0800) SpO2:  [99 %-100 %] 100 % (06/17 0800) Arterial Line BP: (115-183)/(39-60) 128/40 (06/17 0800) Weight:  [80.4 kg] 80.4 kg (06/17 0600)  Hemodynamic parameters for last 24 hours:    Intake/Output from previous day: 06/16 0701 - 06/17 0700 In: 2431 [I.V.:1556.8; Blood:524; IV Piggyback:350.1] Out: 2120 [Urine:1120; Blood:250; Chest Tube:750] Intake/Output this shift: No intake/output data recorded.  General appearance: alert, cooperative, and no distress Neurologic: intact Heart: brady, regular Lungs: faint rhonchi on right + air leak  Lab Results: Recent Labs    08/13/20 1434 08/16/20 0314  WBC 7.6 9.9  HGB 13.0 11.0*  HCT 40.9 34.3*  PLT 165 121*   BMET:  Recent Labs    08/15/20 1555 08/16/20 0314  NA 135 135  K 4.5 4.5  CL 105 104  CO2 23 25  GLUCOSE 159* 138*  BUN 25* 20  CREATININE 1.48* 1.25*  CALCIUM 8.2* 8.1*    PT/INR:  Recent Labs    08/15/20 0804  LABPROT 17.8*  INR 1.5*   ABG    Component Value Date/Time   PHART 7.336 (L) 08/13/2020 1501   HCO3 22.2 08/13/2020 1501   TCO2 25 08/31/2014 1609   ACIDBASEDEF 2.7 (H) 08/13/2020 1501   O2SAT 94.3 08/13/2020 1501   CBG (last 3)  Recent Labs    08/15/20 2022  GLUCAP 159*    Assessment/Plan: S/P Procedure(s) (LRB): XI ROBOTIC ASSISTED THORASCOPY-RIGHT UPPER LOBECTOMY (Right) INTERCOSTAL NERVE BLOCK RIGHT (Right) NODE DISSECTION (Right) POD # 1 Looks good Pain  due to surgery/ chest tubes- well controlled Has an air leak, small apical space on CXR- will leave on suction today Hold enoxaparin due to general oozing during surgery SCD +  ambulation for DVT prophylaxis Resume coumadin in about 48 hours Bradycardia- appreciate Cardiology input. She is in SB in high 40s to low 50s which is about her baseline- cardiac meds on hold- -Plan per Cardiology Transfer to [facility]   LOS: 1 day    Katelyn [name] 08/16/2020

## 2020-08-16 NOTE — Plan of Care (Signed)
  Problem: Clinical Measurements: Goal: Ability to maintain clinical measurements within normal limits will improve Outcome: Progressing Goal: Will remain free from infection Outcome: Progressing Goal: Diagnostic test results will improve Outcome: Progressing Goal: Respiratory complications will improve Outcome: Progressing Goal: Cardiovascular complication will be avoided Outcome: Progressing   Problem: Activity: Goal: Risk for activity intolerance will decrease Outcome: Progressing   Problem: Coping: Goal: Level of anxiety will decrease Outcome: Progressing   Problem: Elimination: Goal: Will not experience complications related to bowel motility Outcome: Progressing Goal: Will not experience complications related to urinary retention Outcome: Progressing   Problem: Pain Managment: Goal: General experience of comfort will improve Outcome: Progressing   Problem: Safety: Goal: Ability to remain free from injury will improve Outcome: Progressing   Problem: Skin Integrity: Goal: Risk for impaired skin integrity will decrease Outcome: Progressing

## 2020-08-16 NOTE — Progress Notes (Signed)
Progress Note  Patient Name: Katelyn [name] Date of Encounter: 08/16/2020  [facility] Cardiologist: Shelva Majestic, MD   Subjective   Doing well, sitting up in a chair. Comfortable. HR 50-60s Blood pressures statble at 100-140s this AM Cr 1.25  Inpatient Medications    Scheduled Meds:  acetaminophen  1,000 mg Oral Q6H   Or   acetaminophen (TYLENOL) oral liquid 160 mg/5 mL  1,000 mg Oral Q6H   bisacodyl  10 mg Oral Daily   Chlorhexidine Gluconate Cloth  6 each Topical Daily   chlorthalidone  25 mg Oral Daily   estradiol  2 mg Oral Daily   hydrALAZINE  25 mg Oral Q8H   meloxicam  15 mg Oral Q breakfast   pantoprazole  40 mg Oral QAC supper   rosuvastatin  20 mg Oral QHS   senna-docusate  1 tablet Oral QHS   thyroid  90 mg Oral Daily   Continuous Infusions:  sodium chloride 75 mL/hr at 08/16/20 0600   epinephrine Stopped (08/15/20 1420)   PRN Meds: ondansetron (ZOFRAN) IV, oxyCODONE, traMADol   Vital Signs    Vitals:   08/16/20 0400 08/16/20 0500 08/16/20 0554 08/16/20 0600  BP: (!) 141/57 112/67 (!) 145/67 (!) 101/46  Pulse: (!) 59 61  (!) 58  Resp: 15 11  (!) 24  Temp:      TempSrc:      SpO2: 100% 100%  100%  Weight:    80.4 kg  Height:        Intake/Output Summary (Last 24 hours) at 08/16/2020 0651 Last data filed at 08/16/2020 0600 Gross per 24 hour  Intake 2355.91 ml  Output 2120 ml  Net 235.91 ml   Last 3 Weights 08/16/2020 08/15/2020 08/13/2020  Weight (lbs) 177 lb 4 oz 176 lb 176 lb 8 oz  Weight (kg) 80.4 kg 79.833 kg 80.06 kg      Telemetry    Sinus bradycardia with HR 40-60 - Personally Reviewed  ECG    No new tracing - Personally Reviewed  Physical Exam   GEN: Comfortable, sitting up in a chair  Neck: No JVD Cardiac: Bradycardic, regular, 2/6 systolic murmur Respiratory: Rhonchorous on right, no wheezing GI: Soft, nontender, non-distended  MS: No edema; No deformity. Neuro:  Nonfocal  Psych: Normal affect   Labs    High  Sensitivity Troponin:  No results for input(s): TROPONINIHS in the last 720 hours.    Chemistry Recent Labs  Lab 08/13/20 1434 08/15/20 1555 08/16/20 0314  NA 135 135 135  K 4.6 4.5 4.5  CL 106 105 104  CO2 22 23 25   GLUCOSE 103* 159* 138*  BUN 27* 25* 20  CREATININE 1.86* 1.48* 1.25*  CALCIUM 8.9 8.2* 8.1*  PROT 6.7  --   --   ALBUMIN 3.5  --   --   AST 20  --   --   ALT 16  --   --   ALKPHOS 74  --   --   BILITOT 0.8  --   --   GFRNONAA 28* 36* 44*  ANIONGAP 7 7 6      Hematology Recent Labs  Lab 08/13/20 1434 08/16/20 0314  WBC 7.6 9.9  RBC 4.25 3.58*  HGB 13.0 11.0*  HCT 40.9 34.3*  MCV 96.2 95.8  MCH 30.6 30.7  MCHC 31.8 32.1  RDW 14.6 14.3  PLT 165 121*    BNPNo results for input(s): BNP, PROBNP in the last 168 hours.  DDimer No results for input(s): DDIMER in the last 168 hours.   Radiology    DG Chest Port 1 View  Result Date: 08/15/2020 CLINICAL DATA:  Postoperative EXAM: PORTABLE CHEST 1 VIEW COMPARISON:  08/13/2020 FINDINGS: Interval placement of a right central venous catheter with tip over the low SVC region. 2 chest drains have been placed. Interval postoperative changes with surgical staples in the right lung consistent with partial resection. The previous right upper lung mass is resected. Moderate pneumothorax in the post resection space. Mild subcutaneous emphysema. Mild cardiac enlargement with previous cardiac valve prosthesis. Calcification of the aorta. IMPRESSION: Postoperative resection of right upper lung with gas in the post pneumonectomy space. Two chest drains and central venous catheter appear in place. Electronically Signed   By: Lucienne Capers M.D.   On: 08/15/2020 17:30    Cardiac Studies   LHC 07/2014 Mid RCA lesion, 100% stenosed. Ost 1st Diag lesion, 70% stenosed. Ost Cx lesion, 50% stenosed. Mid Cx lesion, 40% stenosed. Ost RCA lesion, 60% stenosed.   Normal LV function with an ejection fraction at a proximally 55% but  with evidence for small focal region of mid to basal inferior            hypocontractility. 2+ angiographic mitral regurgitation with mean palmar E wedge pressure at 19 and V wave at 31 mmHg. Severe aortic valve stenosis with a peak to peak gradient of 46 mmHg, and a mean gradient of 39.5 mmHg.  Calculated aortic valve    area 0.64 cm. 1 - 2+ angiographic aortic regurgitation. Significant native coronary obstructive disease with evidence for coronary calcification and 70% proximal first diagonal stenosis; 50% proximal circumflex stenosis before the first marginal branch with 40% AV groove circumflex stenosis; 60% ostial narrowing of the RCA with 100% mid RCA occlusion after a marginal branch with bridging collaterals supplying the mid RCA extending from this marginal branch as well as left-to-right collaterals from the distal circumflex and LAD to the distal RCA.   RECOMMENDATION: Surgical consultation will be obtained for aortic valve replacement/CABG revascularization surgery.   2D Echo 06/2019  1. Left ventricular ejection fraction, by estimation, is 60 to 65%. The  left ventricle has normal function. The left ventricle has no regional  wall motion abnormalities. There is moderate left ventricular hypertrophy.  Left ventricular diastolic  parameters are indeterminate.   2. Right ventricular systolic function is normal. The right ventricular  size is normal. There is moderately elevated pulmonary artery systolic  pressure.   3. Left atrial size was moderately dilated.   4. The mitral valve is normal in structure. Mild mitral valve  regurgitation. No evidence of mitral stenosis.   5. Post TAVR with 26 mm Sapien 3 valve stable gradients since 05/19/18 and  no PVL. The aortic valve has been repaired/replaced. Aortic valve  regurgitation is not visualized. No aortic stenosis is present.   6. The inferior vena cava is normal in size with greater than 50%  respiratory variability, suggesting  right atrial pressure of 3 mmHg.   Patient Profile     80 y.o. female with a hx of CAD (managed medically), aortic stenosis, PVD s/p aortobifem bypass graft surgery and bilateral fundoplasty by VVS, carotid artery disease (97-67% RICA, 3-41% LICA, LECA >93% by duplex 07/2020), bilateral subclavian stenosis with antegrade flow by duplex 07/2020, renal artery stenosis (>60% L renal artery) by duplex 04/2020, HTN, HLD, paroxysmal atrial fibrillation, bradycardia, LBBB, first degree AVB, stroke who presented for planned thoracoscopy and  right upper lobectomy for newly diagnosed RUL lung cancer. Post-op course complicated by junctional bradycardia/sinus bradycardia with HR 30-40s for which Cardiology has been consulted.  Assessment & Plan    #Junctional and Sinus Bradycardia: #Chronic LBBB: #First Degree AVB: Patient developed both junctional and sinus bradycardia post-operatively in the setting of being on amiodarone, diltiazem and metoprolol. Has had several episodes of bradycardia on AVN agents in the past for which they have been intermittently stopped but then resumed when she developed recurrent Afib. Fortunately, she was HD stable and asymptomatic with HR in 30-40s. Now improving to 40-60s after holding above agents. -Continue to hold amiodarone, dilt and metop -No indication for pacing as patient remains HD stable   #RUL Lung Mass s/p Resection: S/p thoracoscopy and right upper lobectomy on 08/15/20. -Post-op care per CV surgery  #Paroxysmal Afib: CHADs-vasc 7. On warfarin at home. Remains in sinus brady this AM. -Resume warfarin as able once cleared from CV surgery standpoint -Holding AVN blocking agents as above due to significant bradycardia  #CAD: No anginal symptoms. -Not on ASA due to need for warfarin -Continue crestor 20mg  daily  #Severe AS s/p TAVR: Last TTE 06/2019 with EF 60-65%, well seated TAVR valve with mean gradient 5.71mmHg. No PVL. -Continue serial monitoring as  out-patient  #PAD s/p aortobifem bypass: #Carotid stenosis: -Not on ASA due to need for warfarin -Continue crestor 20mg  daily -Follow-up with vascular surgery as out-patient as scheduled      For questions or updates, please contact [facility] Please consult [email or web address] for contact info under        Signed, Freada Bergeron, MD  08/16/2020, 6:51 AM

## 2020-08-16 NOTE — Hospital Course (Addendum)
History of Present Illness:  Katelyn [name] is a 80 year old woman with a history of heavy tobacco abuse, gold 2 COPD, severe aortic stenosis, TAVR, severe thoracic aortic atherosclerotic disease, coronary artery disease, peripheral arterial disease, stroke, atrial fibrillation, chronic anticoagulation, hypertension, hyperlipidemia, arthritis, and reflux.  This spring she developed a cough.  This was associated with a cold initially but while the other symptoms resolved, her cough never did.  She finally went to the doctor a chest x-ray showed a right upper lobe opacity.  A CT of the chest showed a 4 cm right upper lobe lung mass.  There were some calcified hilar and mediastinal nodes that were not particularly enlarged.  There also were multiple other lung nodules bilaterally.  She had a PET/CT which showed the right upper lobe mass was markedly hypermetabolic.  There was some moderate increase in activity in the hilar and mediastinal lymph node.  She was evaluated by Dr. Roxan Hockey at which time she complained of decreased energy.  Her appetite is about normal.  She has lost 5 pounds over the past 3 months.  She gets short of breath with exertion but takes her dogs on walks.  She lives by herself and takes care of her own household.  She has a greater than 100-pack-year history of smoking prior to quitting 10 years ago.  Bruises easily from being on Coumadin.  She was offered surgical lung resection via Robotic assistance.  The risks and benefits of the procedure were explained to the patient and she was agreeable to proceed.  Hospital Course:  Katelyn [name] presented to [facility] on 08/15/2020.  She was taken to the operating room and underwent Robotic Assisted Video Thoracoscopy with Right Upper Lobectomy, lymph node dissection, and intercostal nerve block.  She experienced some significant bradycardia during her surgery.  She tolerated the procedure was extubated, and transferred to the ICU for closer  monitoring.  Cardiology consult was obtained for her Bradycardia.  They recommended cessation of AN blocking agent.  If slow HR persisted they felt EP consult may be warranted.  Her chest tube showed evidence of a small air leak and was left on suction.  Her CXR showed right apical space.  She was felt medically stable for transfer to [facility].  She developed a worsening elevation in her creatinine with decreased urinary output.  She was treated with additional IV fluids with increase in urinary output and improvement of creatinine.  Her blood pressure was low and her antihypertensive agents were discontinued.  Her chest tube continued to exhibit an air leak.  This improved and her chest tubes were placed on water seal on 08/19/2020.  The patient converted to Atrial Fibrillation which is chronic condition for her.  She remained on Amiodarone, but her other agents were stopped due to hypotension.  She was restarted on her home regimen of Coumadin.  Follow up CXR showed increase in right apical space.  Her chest tube was kept on water seal.  Her air leak improved and her blake drain was removed on 08/21/2020.  Follow up CXR showed improvement of right pneumothorax. She had a pigtail catheter in place which she will likely require for home use.  The large-bore tube was removed on 6/24 and the CXR remained stable. She continued to have Atrial Fibrillation with increased rates.  Her BP improved and she was restarted on her home Toprol XL for additional HR control.  Her mobility was limited due to orthostasis.  Her Toprol XL was  replaced with 12.5 mg BID Lopressor dosing.  She also had TED hose placed. She progressed slowly with mobility.  Her CXR remained stable, however air leak persisted.  Her chest tube was converted to a Mini Express.  Follow up CXR showed improvement of pneumothorax.  She has been resumed on her home coumadin.  Most recent INR is 2.2.  She will resume previous home monitoring schedule.  She is now  ambulating without significant difficulty.  She is declining SNF and CIR placement.  Home health has been arranged.  Her surgical incisions are healing without evidence of infection.  She is medically stable for discharge home today.

## 2020-08-16 NOTE — Plan of Care (Signed)

## 2020-08-16 NOTE — Plan of Care (Signed)
  Problem: Education: Goal: Knowledge of General Education information will improve Description: Including pain rating scale, medication(s)/side effects and non-pharmacologic comfort measures 08/16/2020 2017 by Shanon Ace, RN Outcome: Progressing 08/16/2020 2016 by Shanon Ace, RN Outcome: Progressing   Problem: Clinical Measurements: Goal: Ability to maintain clinical measurements within normal limits will improve 08/16/2020 2017 by Shanon Ace, RN Outcome: Progressing 08/16/2020 2016 by Shanon Ace, RN Outcome: Progressing Goal: Will remain free from infection 08/16/2020 2017 by Shanon Ace, RN Outcome: Progressing 08/16/2020 2016 by Shanon Ace, RN Outcome: Progressing Goal: Diagnostic test results will improve 08/16/2020 2017 by Shanon Ace, RN Outcome: Progressing 08/16/2020 2016 by Shanon Ace, RN Outcome: Progressing Goal: Respiratory complications will improve 08/16/2020 2017 by Shanon Ace, RN Outcome: Progressing 08/16/2020 2016 by Shanon Ace, RN Outcome: Progressing Goal: Cardiovascular complication will be avoided 08/16/2020 2017 by Shanon Ace, RN Outcome: Progressing 08/16/2020 2016 by Shanon Ace, RN Outcome: Progressing   Problem: Health Behavior/Discharge Planning: Goal: Ability to manage health-related needs will improve 08/16/2020 2017 by Shanon Ace, RN Outcome: Progressing 08/16/2020 2016 by Shanon Ace, RN Outcome: Progressing   Problem: Activity: Goal: Risk for activity intolerance will decrease 08/16/2020 2017 by Shanon Ace, RN Outcome: Progressing 08/16/2020 2016 by Shanon Ace, RN Outcome: Progressing   Problem: Nutrition: Goal: Adequate nutrition will be maintained 08/16/2020 2017 by Shanon Ace, RN Outcome: Progressing 08/16/2020 2016 by Shanon Ace, RN Outcome: Progressing   Problem: Coping: Goal: Level of anxiety will decrease 08/16/2020 2017 by Shanon Ace, RN Outcome: Progressing 08/16/2020 2016 by Shanon Ace, RN Outcome: Progressing   Problem: Elimination: Goal: Will not experience complications related to bowel motility 08/16/2020 2017 by Shanon Ace, RN Outcome: Progressing 08/16/2020 2016 by Shanon Ace, RN Outcome: Progressing Goal: Will not experience complications related to urinary retention 08/16/2020 2017 by Shanon Ace, RN Outcome: Progressing 08/16/2020 2016 by Shanon Ace, RN Outcome: Progressing   Problem: Pain Managment: Goal: General experience of comfort will improve 08/16/2020 2017 by Shanon Ace, RN Outcome: Progressing 08/16/2020 2016 by Shanon Ace, RN Outcome: Progressing   Problem: Safety: Goal: Ability to remain free from injury will improve 08/16/2020 2017 by Shanon Ace, RN Outcome: Progressing 08/16/2020 2016 by Shanon Ace, RN Outcome: Progressing   Problem: Skin Integrity: Goal: Risk for impaired skin integrity will decrease 08/16/2020 2017 by Shanon Ace, RN Outcome: Progressing 08/16/2020 2016 by Shanon Ace, RN Outcome: Progressing   Problem: Education: Goal: Knowledge of disease or condition will improve Outcome: Progressing Goal: Knowledge of the prescribed therapeutic regimen will improve Outcome: Progressing   Problem: Activity: Goal: Risk for activity intolerance will decrease Outcome: Progressing   Problem: Cardiac: Goal: Will achieve and/or maintain hemodynamic stability Outcome: Progressing   Problem: Clinical Measurements: Goal: Postoperative complications will be avoided or minimized Outcome: Progressing   Problem: Respiratory: Goal: Respiratory status will improve Outcome: Progressing   Problem: Pain Management: Goal: Pain level will decrease Outcome: Progressing   Problem: Skin Integrity: Goal: Wound healing without signs and symptoms infection will improve Outcome: Progressing

## 2020-08-16 NOTE — Discharge Summary (Addendum)
hysician Discharge Summary  Patient ID: Katelyn [name] MRN: [id number] DOB/AGE: Aug 25, 1940 80 y.o.  Admit date: 08/15/2020 Discharge date: 08/27/2020  Admission Diagnoses:  Right upper lobe mass  Patient Active Problem List   Diagnosis Date Noted   Status post thoracotomy 08/15/2020   COPD GOLD II 07/18/2020   Mass of upper lobe of right lung 07/11/2020   Thrombocytopenia ([place]) 03/20/2019   Cerebrovascular disease 12/05/2018   Environmental and seasonal allergies 10/13/2017   Gastroesophageal reflux disease 04/13/2017   Esophageal dysphagia 04/13/2017   Chronic anticoagulation 03/10/2017   Current long-term use of postmenopausal hormone replacement therapy 03/10/2017   Non-allergic rhinitis 11/10/2016   Paroxysmal atrial fibrillation ([place]) 02/13/2016   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   Tachycardia-bradycardia syndrome ([place])    Atrial fibrillation with RVR ([place]) 08/29/2014   LBBB (left bundle branch block), chronic 08/29/2014   CAD in native artery    Peripheral vascular disease ([place]) 12/13/2012   Essential hypertension 12/13/2012   Hyperlipidemia with target LDL less than 70 12/13/2012   Atherosclerosis of native arteries of extremity with intermittent claudication ([place]) 08/10/2011   Discharge Diagnoses: 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, Clinical and pathologic stage IIA(T2b,N0)  Patient Active Problem List   Diagnosis Date Noted   S/P Robotic Assisted Right Video Thoracoscopy with Right Upper Lobectomy 08/15/2020   Status post thoracotomy 08/15/2020   COPD GOLD II 07/18/2020   Mass of upper lobe of right lung 07/11/2020   Thrombocytopenia ([place]) 03/20/2019   Cerebrovascular disease 12/05/2018   Environmental and seasonal allergies 10/13/2017   Gastroesophageal reflux disease 04/13/2017   Esophageal dysphagia 04/13/2017   Chronic anticoagulation 03/10/2017   Current long-term use of postmenopausal hormone replacement  therapy 03/10/2017   Non-allergic rhinitis 11/10/2016   Paroxysmal atrial fibrillation ([place]) 02/13/2016   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   Tachycardia-bradycardia syndrome ([place])    Atrial fibrillation with RVR ([place]) 08/29/2014   LBBB (left bundle branch block), chronic 08/29/2014   CAD in native artery    Peripheral vascular disease ([place]) 12/13/2012   Essential hypertension 12/13/2012   Hyperlipidemia with target LDL less than 70 12/13/2012   Atherosclerosis of native arteries of extremity with intermittent claudication ([place]) 08/10/2011   Discharged Condition: good  History of Present Illness:  Katelyn [name] is a 80 year old woman with a history of heavy tobacco abuse, gold 2 COPD, severe aortic stenosis, TAVR, severe thoracic aortic atherosclerotic disease, coronary artery disease, peripheral arterial disease, stroke, atrial fibrillation, chronic anticoagulation, hypertension, hyperlipidemia, arthritis, and reflux.  This spring she developed a cough.  This was associated with a cold initially but while the other symptoms resolved, her cough never did.  She finally went to the doctor a chest x-ray showed a right upper lobe opacity.  A CT of the chest showed a 4 cm right upper lobe lung mass.  There were some calcified hilar and mediastinal nodes that were not particularly enlarged.  There also were multiple other lung nodules bilaterally.  She had a PET/CT which showed the right upper lobe mass was markedly hypermetabolic.  There was some moderate increase in activity in the hilar and mediastinal lymph node.  She was evaluated by Dr. Roxan Hockey at which time she complained of decreased energy.  Her appetite is about normal.  She has lost 5 pounds over the past 3 months.  She gets short of breath with exertion but takes her dogs on walks.  She lives by herself and takes care  of her own household.  She has a greater than 100-pack-year history of smoking prior to quitting  10 years ago.  Bruises easily from being on Coumadin.  She was offered surgical lung resection via Robotic assistance.  The risks and benefits of the procedure were explained to the patient and she was agreeable to proceed.  Hospital Course:  Ms. Beavers presented to [facility] on 08/15/2020.  She was taken to the operating room and underwent Robotic Assisted Video Thoracoscopy with Right Upper Lobectomy, lymph node dissection, and intercostal nerve block.  She experienced some significant bradycardia during her surgery.  She tolerated the procedure was extubated, and transferred to the ICU for closer monitoring.  Cardiology consult was obtained for her Bradycardia.  They recommended cessation of AN blocking agent.  If slow HR persisted they felt EP consult may be warranted.  Her chest tube showed evidence of a small air leak and was left on suction.  Her CXR showed right apical space.  She was felt medically stable for transfer to [facility].  She developed a worsening elevation in her creatinine with decreased urinary output.  She was treated with additional IV fluids with increase in urinary output and improvement of creatinine.  Her blood pressure was low and her antihypertensive agents were discontinued.  Her chest tube continued to exhibit an air leak.  This improved and her chest tubes were placed on water seal on 08/19/2020.  The patient converted to Atrial Fibrillation which is chronic condition for her.  She remained on Amiodarone, but her other agents were stopped due to hypotension.  She was restarted on her home regimen of Coumadin.  Follow up CXR showed increase in right apical space.  Her chest tube was kept on water seal.  Her air leak improved and her blake drain was removed on 08/21/2020.  Follow up CXR showed improvement of right pneumothorax. She had a pigtail catheter in place which she will likely require for home use.  The large-bore tube was removed on 6/24 and the CXR remained stable. She  continued to have Atrial Fibrillation with increased rates.  Her BP improved and she was restarted on her home Toprol XL for additional HR control.  Her mobility was limited due to orthostasis.  Her Toprol XL was replaced with 12.5 mg BID Lopressor dosing.  She also had TED hose placed. She progressed slowly with mobility.  Her CXR remained stable, however air leak persisted.  Her chest tube was converted to a Mini Express.  Follow up CXR showed improvement of pneumothorax.  She has been resumed on her home coumadin.  Most recent INR is 2.2.  She will resume previous home monitoring schedule.  She is now ambulating without significant difficulty.  She is declining SNF and CIR placement.  Home health has been arranged.  Her surgical incisions are healing without evidence of infection.  She is medically stable for discharge home today.   Consults: cardiology  Significant Diagnostic Studies: nuclear medicine:   IMPRESSION: 1. Right upper lobe pulmonary mass is markedly hypermetabolic, consistent with primary bronchogenic neoplasm. 2. Low level hypermetabolism identified in the right hilum and precarinal station. There are multiple calcified lymph nodes in these regions suggesting prior granulomatous disease and no overt noncalcified lymphadenopathy evident on CT. Uptake may be related to chronic granulomatous involvement although metastatic lymphadenopathy not entirely excluded. 3. Several additional scattered tiny pulmonary nodules show no hypermetabolism, but are below threshold for resolution on PET imaging. 4. Cholelithiasis. 5.  Aortic  Atherosclerois (ICD10-170.0) 6.  Emphysema. (HBZ16-R67.9)  Treatments: surgery:   DATE OF PROCEDURE: 08/16/2020   PREOPERATIVE DIAGNOSIS:  Right upper lobe lung mass, suspected non-small cell carcinoma.   POSTOPERATIVE DIAGNOSIS:  Non-small cell carcinoma, right upper lobe, clinical stage IB (T2, N0).   PROCEDURE:  Xi robotic-assisted right upper  lobectomy, lymph node dissection, intercostal nerve blocks levels 3 through 10.   SURGEON:  Modesto Charon, MD   ASSISTANT:  Ellwood Handler and Nicholes Rough, they are both physician assistants.  PATHOLOGY:  PATHOLOGY SURGICAL PATHOLOGY  CASE: [phone]  PATIENT: Shearon Dick  Surgical Pathology Report      Clinical History: right upper lung mass (cm)      FINAL MICROSCOPIC DIAGNOSIS:   A. 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  - Tumor is limited to lung.  - Margins are negative for carcinoma.  - One of one lymph nodes negative for carcinoma (0/1).  - See oncology table.   B. LYMPH NODE, 7, EXCISION:  - One of one lymph nodes negative for carcinoma (0/1).   C. LYMPH NODE, 7 #2, EXCISION:  - One of one lymph nodes negative for carcinoma (0/1).   D. LYMPH NODE, 9, EXCISION:  - One of one lymph nodes negative for carcinoma (0/1).   E. LYMPH NODE, 8, EXCISION:  - One of one lymph nodes negative for carcinoma (0/1).   F. LYMPH NODE, 7 #3, EXCISION:  - One of one lymph nodes negative for carcinoma (0/1).   G. LYMPH NODE, 11, EXCISION:  - One of one lymph nodes negative for carcinoma (0/1).   H. LYMPH NODE, 10, EXCISION:  - One of one lymph nodes negative for carcinoma (0/1).   I. LYMPH NODE, 4R, EXCISION:  - One of one lymph nodes negative for carcinoma (0/1).   J. LYMPH NODE, 10 #2, EXCISION:   - One of one lymph nodes negative for carcinoma (0/1).  - Fibrosis and calcifications.   K. LYMPH NODE, 12, EXCISION:  - One of one lymph nodes negative for carcinoma (0/1).   L. LYMPH NODE, 12 #2, EXCISION:  - One of one lymph nodes negative for carcinoma (0/1).     Discharge Exam: Blood pressure 119/78, pulse 89, temperature 97.8 F (36.6 C), temperature source Oral, resp. rate 16, height 5\' 10"  (1.778 m), weight 80.4 kg, SpO2 95 %.  General appearance: alert, cooperative, and no distress Heart:  irregularly irregular rhythm Lungs: clear to auscultation bilaterally Abdomen: soft, non-tender; bowel sounds normal; no masses,  no organomegaly Extremities: extremities normal, atraumatic, no cyanosis or edema Wound: clean and dry  Discharge disposition: 01-Home or Self Care   Allergies as of 08/27/2020       Reactions   Adhesive [tape] Rash   Latex Rash   Tetanus Toxoids Rash        Medication List     STOP taking these medications    chlorthalidone 25 MG tablet Commonly known as: HYGROTON   diltiazem 120 MG 24 hr capsule Commonly known as: Cardizem CD   hydrALAZINE 25 MG tablet Commonly known as: APRESOLINE   irbesartan 300 MG tablet Commonly known as: AVAPRO   metoprolol succinate 25 MG 24 hr tablet Commonly known as: Toprol XL       TAKE these medications    amiodarone 200 MG tablet Commonly known as: PACERONE Take 1 tablet by mouth once daily   estradiol 2 MG tablet Commonly known as: ESTRACE Take 1  tablet by mouth once daily   ipratropium 0.06 % nasal spray Commonly known as: ATROVENT USE 1 SPRAY(S) IN EACH NOSTRIL EVERY 6 HOURS AS NEEDED FOR RUNNY NOSE What changed: See the new instructions.   meloxicam 15 MG tablet Commonly known as: MOBIC Take 1 tablet by mouth once daily   metoprolol tartrate 25 MG tablet Commonly known as: LOPRESSOR Take 0.5 tablets (12.5 mg total) by mouth 2 (two) times daily.   nitroGLYCERIN 0.4 MG SL tablet Commonly known as: NITROSTAT Place 1 tablet (0.4 mg total) under the tongue every 5 (five) minutes as needed for chest pain.   NP Thyroid 90 MG tablet Generic drug: thyroid Take 1 tablet by mouth once daily What changed: how much to take   oxyCODONE 5 MG immediate release tablet Commonly known as: Oxy IR/ROXICODONE Take 1 tablet (5 mg total) by mouth every 4 (four) hours as needed for moderate pain.   pantoprazole 40 MG tablet Commonly known as: PROTONIX TAKE 1 TABLET BY MOUTH ONCE DAILY BEFORE  SUPPER What changed: See the new instructions.   rosuvastatin 20 MG tablet Commonly known as: CRESTOR TAKE 1 TABLET BY MOUTH AT BEDTIME   Vitamin D3 50 MCG (2000 UT) capsule Take 2,000 Units by mouth daily.   warfarin 2.5 MG tablet Commonly known as: COUMADIN Take as directed. If you are unsure how to take this medication, talk to your nurse or doctor. Original instructions: Take 1 tablet by mouth once daily               Durable Medical Equipment  (From admission, onward)           Start     Ordered   08/19/20 0748  For home use only DME 4 wheeled rolling walker with seat  Once       Question:  Patient needs a walker to treat with the following condition  Answer:  Weakness   08/19/20 0747            Follow-up Information     Melrose Nakayama, MD Follow up on 08/30/2020.   Specialty: Cardiothoracic Surgery Why: Appointment is at 2:00, please get CXR 1:30 at [facility] located on first floor of our office building Contact information: [address] [place] [address] [place] Follow up.   Why: HHPT,HHOT,  HHRN  [place]: Erin Barrett, PA-C  08/27/2020, 8:27 AM

## 2020-08-17 ENCOUNTER — Inpatient Hospital Stay (HOSPITAL_COMMUNITY): Payer: Medicare Other

## 2020-08-17 DIAGNOSIS — Z902 Acquired absence of lung [part of]: Secondary | ICD-10-CM

## 2020-08-17 LAB — COMPREHENSIVE METABOLIC PANEL
ALT: 11 U/L (ref 0–44)
AST: 26 U/L (ref 15–41)
Albumin: 2.7 g/dL — ABNORMAL LOW (ref 3.5–5.0)
Alkaline Phosphatase: 54 U/L (ref 38–126)
Anion gap: 6 (ref 5–15)
BUN: 31 mg/dL — ABNORMAL HIGH (ref 8–23)
CO2: 26 mmol/L (ref 22–32)
Calcium: 8.3 mg/dL — ABNORMAL LOW (ref 8.9–10.3)
Chloride: 101 mmol/L (ref 98–111)
Creatinine, Ser: 2 mg/dL — ABNORMAL HIGH (ref 0.44–1.00)
GFR, Estimated: 25 mL/min — ABNORMAL LOW (ref >60)
Glucose, Bld: 121 mg/dL — ABNORMAL HIGH (ref 70–99)
Potassium: 5 mmol/L (ref 3.5–5.1)
Sodium: 133 mmol/L — ABNORMAL LOW (ref 135–145)
Total Bilirubin: 0.7 mg/dL (ref 0.3–1.2)
Total Protein: 5.4 g/dL — ABNORMAL LOW (ref 6.5–8.1)

## 2020-08-17 LAB — CBC
HCT: 34 % — ABNORMAL LOW (ref 36.0–46.0)
Hemoglobin: 10.7 g/dL — ABNORMAL LOW (ref 12.0–15.0)
MCH: 30.7 pg (ref 26.0–34.0)
MCHC: 31.5 g/dL (ref 30.0–36.0)
MCV: 97.4 fL (ref 80.0–100.0)
Platelets: 148 10*3/uL — ABNORMAL LOW (ref 150–400)
RBC: 3.49 MIL/uL — ABNORMAL LOW (ref 3.87–5.11)
RDW: 14.7 % (ref 11.5–15.5)
WBC: 16.4 10*3/uL — ABNORMAL HIGH (ref 4.0–10.5)
nRBC: 0 % (ref 0.0–0.2)

## 2020-08-17 MED ORDER — POLYETHYLENE GLYCOL 3350 17 G PO PACK
17.0000 g | PACK | Freq: Every day | ORAL | Status: DC
  Administered 2020-08-17 – 2020-08-22 (×2): 17 g via ORAL
  Filled 2020-08-17 (×6): qty 1

## 2020-08-17 MED ORDER — SODIUM CHLORIDE 0.9 % IV SOLN: INTRAVENOUS | Status: AC

## 2020-08-17 MED ORDER — AMIODARONE HCL 200 MG PO TABS
200.0000 mg | ORAL_TABLET | Freq: Every day | ORAL | Status: DC
  Administered 2020-08-17 – 2020-08-27 (×11): 200 mg via ORAL
  Filled 2020-08-17 (×11): qty 1

## 2020-08-17 NOTE — Op Note (Signed)
NAME: Katelyn [name], Katelyn [name] MEDICAL RECORD [name]: [id number] ACCOUNT [name]: [id number] DATE OF BIRTH: October 28, 1940 FACILITY: [facility] LOCATION: [facility] PHYSICIAN: Revonda Standard. Roxan Hockey, MD  Operative Report   DATE OF PROCEDURE: 08/16/2020  PREOPERATIVE DIAGNOSIS:  Right upper lobe lung mass, suspected non-small cell carcinoma.  POSTOPERATIVE DIAGNOSIS:  Non-small cell carcinoma, right upper lobe, clinical stage IB (T2, N0).  PROCEDURE:   Xi robotic-assisted right upper lobectomy,  Lymph node dissection,  Intercostal nerve blocks levels 3 through 10.  SURGEON:  Modesto Charon, MD  ASSISTANT:  Ellwood Handler, PA and Nicholes Rough, [place]  ANESTHESIA:  General.  FINDINGS:  Fragile tissue.  Enlarged but otherwise benign appearing nodes.  One calcified node.  Frozen section revealed non-small cell carcinoma.  Bronchial margin clear.  CLINICAL NOTE:  Katelyn [name] is a 80 year old woman with a history of tobacco abuse and COPD.  She also has a history of cardiovascular disease, atrial fibrillation, and bradycardia.  She was found to have a right upper lobe lung mass which was markedly  hypermetabolic on PET/CT.  She was offered the option of biopsy and referral for chemoradiation or surgical resection with the understanding that surgical resection would be high risk due to her cardiovascular comorbidities.  The indications, risks,  benefits, and alternatives were discussed in detail with the patient.  She understood and accepted the risks and strongly desired to proceed with surgery.  OPERATIVE NOTE:  Katelyn [name] was brought to the preoperative holding area on 08/16/2020. There, she had placement of a central line and an arterial blood pressure monitoring line.  She was taken to the operating room, anesthetized and intubated with a  double lumen endotracheal tube.  She was placed in a left lateral decubitus position.  A Foley catheter was placed before turning the patient.  Sequential compression devices were placed  as well.  She was placed in the left lateral decubitus position.  A  Bair Hugger was placed for active warming.  The right chest was prepped and draped in the usual sterile fashion.  Single lung ventilation of the left lung was initiated and was tolerated well throughout the procedure.  She did have bradycardia with  heart rates as low as 35 and was at times in a junctional rhythm but maintained a good blood pressure throughout the procedure.  A timeout was performed.  A solution containing 20 mL of liposomal bupivacaine, 30 mL of 0.5% bupivacaine and 50 mL of saline was prepared.  This was used for local at the incision sites as well as for the intercostal nerve blocks.  An incision was made  in the eighth interspace in the mid axillary line.  An 8 mm port was inserted into the chest.  The thoracoscope was advanced into the chest.  There was a small tear in the visceral pleura of the lower lobe noted.  This was later stapled.  A 12 mm port  then was placed in the eighth interspace anterior to the camera port.  Intercostal nerve blocks were performed from the third to the tenth interspace by injecting 10 mL of the bupivacaine solution into a subpleural plane at each level.  Two additional  eighth interspace ports were placed for robotic arms and then a 12 mm AirSeal port was placed in the tenth interspace posterolaterally.  The robot was deployed.  The camera arm was docked.  The thoracoscope was advanced into the chest.  Targeting was  performed.  The remaining arms were docked.  The instruments were inserted with  thoracoscopic visualization.  There was [name] abnormality of the visceral or parietal pleura and there was [name] pleural effusion.  The lower lobe was retracted superiorly.  In doing so, there were some small visceral pleural tears along the lower lobe.  Tissue was very fragile.  The  inferior ligament was divided with bipolar cautery.  All lymph nodes that were encountered during the dissection were  removed and sent as separate specimens for permanent pathology.  None appeared grossly malignant.  Level 9 and 8 nodes were removed.   The pleural reflection was divided at the hilum posteriorly working up towards the azygos vein and level 7 nodes were removed.  The level 11 node at the bifurcation of the right upper lobe bronchus from the bronchus intermedius was removed.  Then,  working superiorly, there was a calcified hilar node that was removed.  There were multiple nodes in the right paratracheal space that were removed.  There was some bleeding and Surgicel was applied into the right paratracheal space that was later  removed and there was [name] ongoing bleeding.  Working inferiorly, the pleural reflection was divided overlying the superior pulmonary vein.  The right middle lobe vein was identified. Next, working back towards the fissure, the pulmonary artery was  identified in the fissure near the confluence.  The pleural reflection was divided over that. Division of the minor fissure then was begun using the robotic stapler, working from anterior to posterior.  Dissection then was carried out above the pulmonary  artery and the minor fissure was completed.  The upper lobe branches of the superior pulmonary vein were encircled and divided with the robotic stapler using a vascular cartridge.  The pulmonary artery then was dissected out.  The apical right upper lobe  branch was identified.  This was encircled and divided with the vascular stapler.  There was a large anterior branch and then a very small branch directly adjacent to it.  These were divided together.  The posterior ascending branch was identified, but  was not divided yet at this point.  The major fissure was completed between the superior segment and the right upper lobe with the robotic stapler and then the posterior ascending branch was encircled and divided.  Finally, a stapler was placed across  the right upper lobe bronchus at its  origin and closed.  A test inflation showed good aeration of the lower and middle lobes.  The stapler was fired, transecting the bronchus.  The vessel loop and sponges that had been placed during the procedure were  removed.  The right upper lobe was placed into an endoscopic retrieval bag and brought down to the inferior aspect of the chest.  The robotic instruments were removed and the robot was undocked.  The anterior eighth interspace incision was lengthened to  approximately 3 cm and the upper lobe was removed through that incision.  It was sent for frozen section, which later returned showing non-small cell carcinoma with a bronchial margin free of tumor.  The chest was copiously irrigated with warm saline.   Hemostasis was achieved.  A test inflation to 30 cm of water revealed [name] leakage from the bronchial stump.  Some areas of pleural tears in the lower lobe had beebn stapled using the robotic stapler and then the right middle lobe was tacked to the right lower  lobe to prevent torsion.  This was also done with a stapler, this was the [employer].   A 28 [place] Blake drain  was placed through the original port incision and directed to the apex and a 14-French pigtail catheter was placed anteriorly and directed to the apex.   Both were secured with #1 silk sutures.  Dual lung ventilation was resumed.  The thoracoscope was removed.  The remaining incisions were closed in standard fashion.  The chest tubes were placed to a Pleur-evac on waterseal.  The patient then was  extubated in the operating room and taken to the postanesthetic care unit in good condition.  All sponge, needle and instrument counts were correct at the end of the procedure.   SHW D: 08/16/2020 5:12:27 pm T: 08/17/2020 12:13:00 am  JOB: [id number]/ [id number]

## 2020-08-17 NOTE — Evaluation (Signed)
Physical Therapy Evaluation Patient Details Name: Katelyn [name] MRN: [id number] DOB: 1940/12/23 Today's Date: 08/17/2020   History of Present Illness  Pt is a 80 y/o female presenting for XI Robotic Assisted Thorascopy for right upper lobectomy 6/16. PMH includes: heavy tobacco abuse, gold 2 COPD, severe aortic stenosis, TAVR, severe thoracic aortic atherosclerotic disease, coronary artery disease, peripheral arterial disease, stroke, atrial fibrillation, chronic anticoagulation, hypertension, hyperlipidemia, arthritis, and reflux.  Clinical Impression  Pt demonstrates deficits in functional mobility, gait, activity tolerance, and strength. Pt tolerates ambulation of household distances with use of RW and without physical assistance, limited by fatigue and pain. Pt reports feeling weaker compared to baseline. Pt will benefit from continued acute PT to improve independence in mobility. SPT recommends HHPT to aid in return to prior level and increase activity tolerance.     Follow Up Recommendations Home health PT    Equipment Recommendations  Rolling walker with 5" wheels    Recommendations for Other Services       Precautions / Restrictions Precautions Precautions: Fall Precaution Comments: chest tube Restrictions Weight Bearing Restrictions: No      Mobility  Bed Mobility Overal bed mobility: Needs Assistance Bed Mobility: Supine to Sit;Sit to Supine     Supine to sit: Min guard Sit to supine: Min guard   General bed mobility comments: min G for safety and management of chest tube/leads    Transfers Overall transfer level: Needs assistance Equipment used: Rolling walker (2 wheeled) Transfers: Sit to/from Stand           General transfer comment: min G for safety and cues for hand placement.  Ambulation/Gait Ambulation/Gait assistance: Min guard Gait Distance (Feet): 140 Feet Assistive device: Rolling walker (2 wheeled) Gait Pattern/deviations: Step-through  pattern;Decreased stride length Gait velocity: reduced Gait velocity interpretation: 1.31 - 2.62 ft/sec, indicative of limited community ambulator General Gait Details: slow, steady step-through gait with no overt LOB noted.  Stairs            Wheelchair Mobility    Modified Rankin (Stroke Patients Only)       Balance Overall balance assessment: Needs assistance Sitting-balance support: Feet supported Sitting balance-Leahy Scale: Fair     Standing balance support: During functional activity Standing balance-Leahy Scale: Poor Standing balance comment: Pt reliant on UE support                             Pertinent Vitals/Pain Pain Assessment: 0-10 Pain Score: 8  Pain Location: R chest and back Pain Descriptors / Indicators: Discomfort Pain Intervention(s): Limited activity within patient's tolerance;Monitored during session    Home Living Family/patient expects to be discharged to:: Private residence Living Arrangements: Alone (daughter will be temporarily staying with pt at discharge) Available Help at Discharge: Family;Available PRN/intermittently Type of Home: House Home Access: Stairs to enter Entrance Stairs-Rails: None Entrance Stairs-Number of Steps: 3 Home Layout: One level Home Equipment: Shower seat;Bedside commode      Prior Function Level of Independence: Independent         Comments: pt independent with ADLs and drives     Hand Dominance        Extremity/Trunk Assessment   Upper Extremity Assessment Upper Extremity Assessment: Overall WFL for tasks assessed    Lower Extremity Assessment Lower Extremity Assessment: Generalized weakness    Cervical / Trunk Assessment Cervical / Trunk Assessment: Kyphotic  Communication   Communication: No difficulties  Cognition Arousal/Alertness: Awake/alert Behavior During  Therapy: WFL for tasks assessed/performed Overall Cognitive Status: Within Functional Limits for tasks  assessed                                        General Comments General comments (skin integrity, edema, etc.): Pt initally on 2 L, satting 98%. Pt weaned to RA with o2 reading 88-89% and unreliable pleth line. Pt placed back on 2 L.    Exercises     Assessment/Plan    PT Assessment Patient needs continued PT services  PT Problem List Decreased strength;Decreased activity tolerance;Decreased balance;Decreased mobility;Decreased knowledge of use of DME;Pain       PT Treatment Interventions DME instruction;Gait training;Stair training;Functional mobility training;Therapeutic activities;Therapeutic exercise;Balance training;Patient/family education    PT Goals (Current goals can be found in the Care Plan section)  Acute Rehab PT Goals Patient Stated Goal: get better and go home PT Goal Formulation: With patient Time For Goal Achievement: 08/31/20 Potential to Achieve Goals: Good    Frequency Min 3X/week   Barriers to discharge        Co-evaluation               AM-PAC PT "6 Clicks" Mobility  Outcome Measure Help needed turning from your back to your side while in a flat bed without using bedrails?: A Little Help needed moving from lying on your back to sitting on the side of a flat bed without using bedrails?: A Little Help needed moving to and from a bed to a chair (including a wheelchair)?: A Little Help needed standing up from a chair using your arms (e.g., wheelchair or bedside chair)?: A Little Help needed to walk in hospital room?: A Little Help needed climbing 3-5 steps with a railing? : A Little 6 Click Score: 18    End of Session Equipment Utilized During Treatment: Gait belt Activity Tolerance: Patient limited by fatigue;Patient limited by pain Patient left: in bed;with call bell/phone within reach Nurse Communication: Mobility status PT Visit Diagnosis: Other abnormalities of gait and mobility (R26.89);Muscle weakness (generalized)  (M62.81);Difficulty in walking, not elsewhere classified (R26.2);Pain Pain - Right/Left: Right Pain - part of body:  (chest, back)    Time: 7048-8891 PT Time Calculation (min) (ACUTE ONLY): 27 min   Charges:   PT Evaluation $PT Eval Low Complexity: 1 Low          Acute Rehab  Pager: [phone]   Garwin Brothers, SPT  08/17/2020, 5:23 PM

## 2020-08-17 NOTE — Progress Notes (Addendum)
      [address]       [place] [address]             [phone]      2 Days Post-Op Procedure(s) (LRB): XI ROBOTIC ASSISTED THORASCOPY-RIGHT UPPER LOBECTOMY (Right) INTERCOSTAL NERVE BLOCK RIGHT (Right) NODE DISSECTION (Right) Subjective: Feels okay this morning, pain is well controlled, she does feel weak when she tried to stand up and walk.   Objective: Vital signs in last 24 hours: Temp:  [96.1 F (35.6 C)-97.8 F (36.6 C)] 97.6 F (36.4 C) (06/18 0700) Pulse Rate:  [52-83] 65 (06/18 0700) Cardiac Rhythm: Atrial fibrillation;Bundle branch block (06/18 0704) Resp:  [12-20] 14 (06/18 0700) BP: (103-149)/(48-91) 121/62 (06/18 0700) SpO2:  [99 %-100 %] 100 % (06/18 0700)     Intake/Output from previous day: 06/17 0701 - 06/18 0700 In: 900.4 [P.O.:726; I.V.:174.4] Out: 1050 [Urine:890; Chest Tube:160] Intake/Output this shift: Total I/O In: -  Out: 530 [Urine:450; Chest Tube:80]  General appearance: alert, cooperative, and no distress Heart: regular rate and rhythm, S1, S2 normal, no murmur, click, rub or gallop Lungs: clear to auscultation bilaterally Abdomen: soft, non-tender; bowel sounds normal; no masses,  no organomegaly Extremities: extremities normal, atraumatic, no cyanosis or edema Wound: clean and dry  Lab Results: Recent Labs    08/16/20 0314 08/17/20 0027  WBC 9.9 16.4*  HGB 11.0* 10.7*  HCT 34.3* 34.0*  PLT 121* 148*   BMET:  Recent Labs    08/16/20 0314 08/17/20 0027  NA 135 133*  K 4.5 5.0  CL 104 101  CO2 25 26  GLUCOSE 138* 121*  BUN 20 31*  CREATININE 1.25* 2.00*  CALCIUM 8.1* 8.3*    PT/INR:  Recent Labs    08/15/20 0804  LABPROT 17.8*  INR 1.5*   ABG    Component Value Date/Time   PHART 7.336 (L) 08/13/2020 1501   HCO3 22.2 08/13/2020 1501   TCO2 25 08/31/2014 1609   ACIDBASEDEF 2.7 (H) 08/13/2020 1501   O2SAT 94.3 08/13/2020 1501   CBG (last 3)  Recent Labs    08/15/20 2022  GLUCAP 159*     Assessment/Plan: S/P Procedure(s) (LRB): XI ROBOTIC ASSISTED THORASCOPY-RIGHT UPPER LOBECTOMY (Right) INTERCOSTAL NERVE BLOCK RIGHT (Right) NODE DISSECTION (Right)  CV-NSR in the 60s, BP well controlled Pulm- tiny right apical pneumo on CXR.  Renal-creatinine 2.00, not on Toradol, weight not recorded this morning.  H and H 10.7/34.0, stable No lovenox due to intra-op bleeding.  Plan: Okay to discontinue foley and IJ. She is not moving much yet so will add SCDs for DVT prophylaxis. Continue to use incentive spirometer. Will add PT as well to assist with mobilization and strength exercises. We need to watch her creatinine closely. She is drinking plenty of fluids but urine output has slowed.    LOS: 2 days    Elgie Collard 08/17/2020  I have seen and examined the patient and agree with the assessment and plan as outlined.  Rexene Alberts, MD 08/17/2020 5:01 PM

## 2020-08-17 NOTE — Progress Notes (Signed)
Progress Note  Patient Name: Katelyn [name] Date of Encounter: 08/17/2020  [facility] Cardiologist: Shelva Majestic, MD   Subjective   Sitting up in bed.  Some discomfort in her throat.  Shortness of breath stable.  Not aware of her arrhythmia.  Inpatient Medications    Scheduled Meds:  acetaminophen  1,000 mg Oral Q6H   Or   acetaminophen (TYLENOL) oral liquid 160 mg/5 mL  1,000 mg Oral Q6H   bisacodyl  10 mg Oral Daily   Chlorhexidine Gluconate Cloth  6 each Topical Daily   estradiol  2 mg Oral Daily   hydrALAZINE  25 mg Oral Q8H   pantoprazole  40 mg Oral QAC supper   rosuvastatin  20 mg Oral QHS   senna-docusate  1 tablet Oral QHS   thyroid  90 mg Oral Daily   Continuous Infusions:  sodium chloride Stopped (08/16/20 1900)   PRN Meds: ondansetron (ZOFRAN) IV, oxyCODONE, traMADol   Vital Signs    Vitals:   08/17/20 0000 08/17/20 0300 08/17/20 0618 08/17/20 0700  BP: (!) 133/51 115/62 119/77 121/62  Pulse: (!) 57 83  65  Resp: 12 20  14   Temp: 97.6 F (36.4 C) 97.8 F (36.6 C)  97.6 F (36.4 C)  TempSrc: Oral Oral  Oral  SpO2: 100% 99%  100%  Weight:      Height:        Intake/Output Summary (Last 24 hours) at 08/17/2020 1058 Last data filed at 08/17/2020 0845 Gross per 24 hour  Intake 650.37 ml  Output 1580 ml  Net -929.63 ml    Last 3 Weights 08/16/2020 08/15/2020 08/13/2020  Weight (lbs) 177 lb 4 oz 176 lb 176 lb 8 oz  Weight (kg) 80.4 kg 79.833 kg 80.06 kg      Telemetry    Atrial fibrillation intermittent with sinus rhythm- Personally Reviewed  ECG    No new tracing - Personally Reviewed  Physical Exam   Well developed and nourished in no acute distress wearing oxygen with a chest tube in place HENT normal Neck supple  Clear decreased breath sounds Irregularly irregular rate and rhythm, no murmurs or gallops Abd-soft with active BS No Clubbing cyanosis edema Skin-warm and dry A & Oriented  Grossly normal sensory and motor  function  ECG     Labs    High Sensitivity Troponin:  No results for input(s): TROPONINIHS in the last 720 hours.    Chemistry Recent Labs  Lab 08/13/20 1434 08/15/20 1555 08/16/20 0314 08/17/20 0027  NA 135 135 135 133*  K 4.6 4.5 4.5 5.0  CL 106 105 104 101  CO2 22 23 25 26   GLUCOSE 103* 159* 138* 121*  BUN 27* 25* 20 31*  CREATININE 1.86* 1.48* 1.25* 2.00*  CALCIUM 8.9 8.2* 8.1* 8.3*  PROT 6.7  --   --  5.4*  ALBUMIN 3.5  --   --  2.7*  AST 20  --   --  26  ALT 16  --   --  11  ALKPHOS 74  --   --  54  BILITOT 0.8  --   --  0.7  GFRNONAA 28* 36* 44* 25*  ANIONGAP 7 7 6 6       Hematology Recent Labs  Lab 08/13/20 1434 08/16/20 0314 08/17/20 0027  WBC 7.6 9.9 16.4*  RBC 4.25 3.58* 3.49*  HGB 13.0 11.0* 10.7*  HCT 40.9 34.3* 34.0*  MCV 96.2 95.8 97.4  MCH 30.6 30.7 30.7  MCHC 31.8 32.1 31.5  RDW 14.6 14.3 14.7  PLT 165 121* 148*     BNPNo results for input(s): BNP, PROBNP in the last 168 hours.   DDimer No results for input(s): DDIMER in the last 168 hours.   Radiology    DG Chest Port 1 View  Result Date: 08/17/2020 CLINICAL DATA:  Status post lobectomy. EXAM: PORTABLE CHEST 1 VIEW COMPARISON:  08/16/2020 FINDINGS: Right jugular central line tip is in the lower SVC region. Patient has had a TAVR procedure. Again noted are 2 right chest tubes. Possible pleural line in the mid right chest near the pigtail chest tube which is unchanged and there may be a second small pleural line near the right lung apex. No significant lung consolidation. Heart and mediastinum are stable. No evidence for a left pneumothorax. Small amount of subcutaneous gas in the right neck. Again noted are surgical clips in the right lung. IMPRESSION: 1. Stable position of the right chest tubes. 2. Questionable small pleural lines in the right chest and suspect there may be a small complex pneumothorax. No significant change from the recent comparison examination. Electronically Signed    By: Markus Daft M.D.   On: 08/17/2020 09:24   DG CHEST PORT 1 VIEW  Result Date: 08/16/2020 CLINICAL DATA:  Chest tubes in place for pneumothorax EXAM: PORTABLE CHEST 1 VIEW COMPARISON:  August 15, 2020 FINDINGS: Postoperative change noted on the right a large-bore chest tube and a pigtail catheter unchanged in position on the right. Pneumothorax in the postoperative space is again noted, similar to 1 day prior. There is postoperative change on the right with soft tissue fullness in the right perihilar region, stable. No new opacity evident. Left lung clear. Heart is mildly enlarged. Status post aortic valve replacement. There is aortic atherosclerosis. No bone lesions. There is subcutaneous air on the right, stable. IMPRESSION: Postoperative change on the right with fullness in the right perihilar region, stable and potentially representing postoperative hematoma. Previously noted right lobe mass has been removed. Chest tubes on the right again noted with pneumothorax, stable, not extending completely to the apex. No new opacity. Left lung clear. Stable cardiac silhouette. Status post aortic valve replacement. Aortic Atherosclerosis (ICD10-I70.0). Electronically Signed   By: Lowella Grip III M.D.   On: 08/16/2020 08:07   DG Chest Port 1 View  Result Date: 08/15/2020 CLINICAL DATA:  Postoperative EXAM: PORTABLE CHEST 1 VIEW COMPARISON:  08/13/2020 FINDINGS: Interval placement of a right central venous catheter with tip over the low SVC region. 2 chest drains have been placed. Interval postoperative changes with surgical staples in the right lung consistent with partial resection. The previous right upper lung mass is resected. Moderate pneumothorax in the post resection space. Mild subcutaneous emphysema. Mild cardiac enlargement with previous cardiac valve prosthesis. Calcification of the aorta. IMPRESSION: Postoperative resection of right upper lung with gas in the post pneumonectomy space. Two chest  drains and central venous catheter appear in place. Electronically Signed   By: Lucienne Capers M.D.   On: 08/15/2020 17:30    Cardiac Studies   LHC 07/2014 Mid RCA lesion, 100% stenosed. Ost 1st Diag lesion, 70% stenosed. Ost Cx lesion, 50% stenosed. Mid Cx lesion, 40% stenosed. Ost RCA lesion, 60% stenosed.   Normal LV function with an ejection fraction at a proximally 55% but with evidence for small focal region of mid to basal inferior            hypocontractility. 2+ angiographic mitral regurgitation  with mean palmar E wedge pressure at 19 and V wave at 31 mmHg. Severe aortic valve stenosis with a peak to peak gradient of 46 mmHg, and a mean gradient of 39.5 mmHg.  Calculated aortic valve    area 0.64 cm. 1 - 2+ angiographic aortic regurgitation. Significant native coronary obstructive disease with evidence for coronary calcification and 70% proximal first diagonal stenosis; 50% proximal circumflex stenosis before the first marginal branch with 40% AV groove circumflex stenosis; 60% ostial narrowing of the RCA with 100% mid RCA occlusion after a marginal branch with bridging collaterals supplying the mid RCA extending from this marginal branch as well as left-to-right collaterals from the distal circumflex and LAD to the distal RCA.   RECOMMENDATION: Surgical consultation will be obtained for aortic valve replacement/CABG revascularization surgery.   2D Echo 06/2019  1. Left ventricular ejection fraction, by estimation, is 60 to 65%. The  left ventricle has normal function. The left ventricle has no regional  wall motion abnormalities. There is moderate left ventricular hypertrophy.  Left ventricular diastolic  parameters are indeterminate.   2. Right ventricular systolic function is normal. The right ventricular  size is normal. There is moderately elevated pulmonary artery systolic  pressure.   3. Left atrial size was moderately dilated.   4. The mitral valve is normal in  structure. Mild mitral valve  regurgitation. No evidence of mitral stenosis.   5. Post TAVR with 26 mm Sapien 3 valve stable gradients since 05/19/18 and  no PVL. The aortic valve has been repaired/replaced. Aortic valve  regurgitation is not visualized. No aortic stenosis is present.   6. The inferior vena cava is normal in size with greater than 50%  respiratory variability, suggesting right atrial pressure of 3 mmHg.   Patient Profile     80 y.o. female with a hx of CAD (managed medically), aortic stenosis, PVD s/p aortobifem bypass graft surgery and bilateral fundoplasty by VVS, carotid artery disease (62-83% RICA, 6-62% LICA, LECA >94% by duplex 07/2020), bilateral subclavian stenosis with antegrade flow by duplex 07/2020, renal artery stenosis (>60% L renal artery) by duplex 04/2020, HTN, HLD, paroxysmal atrial fibrillation, bradycardia, LBBB, first degree AVB, stroke who presented for planned thoracoscopy and right upper lobectomy for newly diagnosed RUL lung cancer. Post-op course complicated by junctional bradycardia/sinus bradycardia with HR 30-40s for which Cardiology has been consulted.  Assessment & Plan    J unctional and Sinus Bradycardia:  First Degree AVB:  Atrial fibrillation-paroxysmal  Aortic stenosis-severe status post TAVR  Peripheral vascular disease with carotid stenosis and prior aortobifem  Right upper lung status post fluoroscopy and right upper lobectomy  Renal injury-acute   Bradycardia prompted the holding of her amiodarone diltiazem and metoprolol; thankfully she is asymptomatic with her atrial fibrillation.  However Will resume amiodarone  anticoagulation reinitiation per surgery.  Initial recommendation had been 2 days postop.     For questions or updates, please contact [place] Please consult [email or web address] for contact info under        Signed, Virl Axe, MD  08/17/2020, 10:58 AM

## 2020-08-17 NOTE — Plan of Care (Signed)
  Problem: Education: Goal: Knowledge of General Education information will improve Description: Including pain rating scale, medication(s)/side effects and non-pharmacologic comfort measures Outcome: Progressing   Problem: Health Behavior/Discharge Planning: Goal: Ability to manage health-related needs will improve Outcome: Progressing   Problem: Clinical Measurements: Goal: Ability to maintain clinical measurements within normal limits will improve Outcome: Progressing Goal: Will remain free from infection Outcome: Progressing Goal: Respiratory complications will improve Outcome: Progressing   Problem: Activity: Goal: Risk for activity intolerance will decrease Outcome: Progressing   Problem: Nutrition: Goal: Adequate nutrition will be maintained Outcome: Progressing   Problem: Elimination: Goal: Will not experience complications related to bowel motility Outcome: Progressing

## 2020-08-18 ENCOUNTER — Inpatient Hospital Stay (HOSPITAL_COMMUNITY): Payer: Medicare Other

## 2020-08-18 DIAGNOSIS — I4819 Other persistent atrial fibrillation: Secondary | ICD-10-CM

## 2020-08-18 DIAGNOSIS — I959 Hypotension, unspecified: Secondary | ICD-10-CM

## 2020-08-18 DIAGNOSIS — Z9889 Other specified postprocedural states: Secondary | ICD-10-CM

## 2020-08-18 LAB — BASIC METABOLIC PANEL
Anion gap: 5 (ref 5–15)
BUN: 35 mg/dL — ABNORMAL HIGH (ref 8–23)
CO2: 26 mmol/L (ref 22–32)
Calcium: 8.4 mg/dL — ABNORMAL LOW (ref 8.9–10.3)
Chloride: 105 mmol/L (ref 98–111)
Creatinine, Ser: 1.82 mg/dL — ABNORMAL HIGH (ref 0.44–1.00)
GFR, Estimated: 28 mL/min — ABNORMAL LOW (ref >60)
Glucose, Bld: 112 mg/dL — ABNORMAL HIGH (ref 70–99)
Potassium: 4.9 mmol/L (ref 3.5–5.1)
Sodium: 136 mmol/L (ref 135–145)

## 2020-08-18 LAB — PROTIME-INR
INR: 1.3 — ABNORMAL HIGH (ref 0.8–1.2)
Prothrombin Time: 16.6 seconds — ABNORMAL HIGH (ref 11.4–15.2)

## 2020-08-18 NOTE — Plan of Care (Signed)

## 2020-08-18 NOTE — Progress Notes (Addendum)
      [address]       [place] [address]             [phone]        3 Days Post-Op Procedure(s) (LRB): XI ROBOTIC ASSISTED THORASCOPY-RIGHT UPPER LOBECTOMY (Right) INTERCOSTAL NERVE BLOCK RIGHT (Right) NODE DISSECTION (Right) Subjective: Feels okay this morning. She did get up and walked in the halls yesterday but she states her legs still feel weak and she tires easily.   Objective: Vital signs in last 24 hours: Temp:  [98 F (36.7 C)-98.7 F (37.1 C)] 98.7 F (37.1 C) (06/19 0757) Pulse Rate:  [50-77] 50 (06/19 0419) Cardiac Rhythm: Atrial fibrillation;Bundle branch block (06/19 0706) Resp:  [12-17] 12 (06/19 0419) BP: (97-107)/(43-62) 102/54 (06/19 0419) SpO2:  [97 %-100 %] 99 % (06/19 0419)     Intake/Output from previous day: 06/18 0701 - 06/19 0700 In: 1182.5 [P.O.:250; I.V.:932.5] Out: 1350 [Urine:720; Chest Tube:630] Intake/Output this shift: Total I/O In: -  Out: 40 [Chest Tube:40]  General appearance: alert, cooperative, and no distress Heart: regular rate and rhythm, S1, S2 normal, no murmur, click, rub or gallop Lungs: clear to auscultation bilaterally Abdomen: soft, non-tender; bowel sounds normal; no masses,  no organomegaly Extremities: extremities normal, atraumatic, no cyanosis or edema Wound: clean and dry  Lab Results: Recent Labs    08/16/20 0314 08/17/20 0027  WBC 9.9 16.4*  HGB 11.0* 10.7*  HCT 34.3* 34.0*  PLT 121* 148*   BMET:  Recent Labs    08/17/20 0027 08/18/20 0101  NA 133* 136  K 5.0 4.9  CL 101 105  CO2 26 26  GLUCOSE 121* 112*  BUN 31* 35*  CREATININE 2.00* 1.82*  CALCIUM 8.3* 8.4*    PT/INR:  Recent Labs    08/18/20 0101  LABPROT 16.6*  INR 1.3*   ABG    Component Value Date/Time   PHART 7.336 (L) 08/13/2020 1501   HCO3 22.2 08/13/2020 1501   TCO2 25 08/31/2014 1609   ACIDBASEDEF 2.7 (H) 08/13/2020 1501   O2SAT 94.3 08/13/2020 1501   CBG (last 3)  Recent Labs     08/15/20 2022  GLUCAP 159*    Assessment/Plan: S/P Procedure(s) (LRB): XI ROBOTIC ASSISTED THORASCOPY-RIGHT UPPER LOBECTOMY (Right) INTERCOSTAL NERVE BLOCK RIGHT (Right) NODE DISSECTION (Right)  CV-NSR in the 60s, BP well controlled Pulm- CXR with small right pnenumo, blake drain and pleurx catheter in place. +++ air leak.  Renal-creatinine 1.82, improved with fluid resuscitation , no Toradol or Lasix H and H 10.7/34.0, stable No lovenox due to intra-op bleeding. SCDs ordered PT ordered yesterday to work on weakness/mobilization    Plan: BP soft at times, continue fluid this morning. Still with large air leak. Continue to encourage ambulation and work with PT. Creatinine improving.    LOS: 3 days    Elgie Collard 08/18/2020   I have seen and examined the patient and agree with the assessment and plan as outlined.  Post op elevated serum creatinine - acute exacerbation of baseline CKD, likely due to prerenal azotemia +/- acute kidney injury caused by ATN, UOP adequate and creatinine trending down.   Rexene Alberts, MD 08/18/2020 12:31 PM

## 2020-08-18 NOTE — Plan of Care (Signed)

## 2020-08-18 NOTE — Progress Notes (Signed)
Progress Note  Patient Name: Katelyn [name] Date of Encounter: 08/18/2020  [facility] Cardiologist: Shelva Majestic, MD    Patient Profile     80 y.o. female with a hx of CAD (managed medically), aortic stenosis, PVD s/p aortobifem bypass graft surgery and bilateral fundoplasty by VVS, carotid artery disease (93-79% RICA, 0-24% LICA, LECA >09% by duplex 07/2020), bilateral subclavian stenosis with antegrade flow by duplex 07/2020, renal artery stenosis (>60% L renal artery) by duplex 04/2020, HTN in the past with gradual elimination of antihypertensive meds, HLD, paroxysmal atrial fibrillation, bradycardia, LBBB, first degree AVB, stroke who presented for planned thoracoscopy and right upper lobectomy for newly diagnosed RUL lung cancer. Post-op course complicated by junctional bradycardia/sinus bradycardia with HR 30-40s  BP low this am   Subjective  Feels rough Pleuritic pain    Inpatient Medications    Scheduled Meds:  acetaminophen  1,000 mg Oral Q6H   Or   acetaminophen (TYLENOL) oral liquid 160 mg/5 mL  1,000 mg Oral Q6H   amiodarone  200 mg Oral Daily   bisacodyl  10 mg Oral Daily   Chlorhexidine Gluconate Cloth  6 each Topical Daily   estradiol  2 mg Oral Daily   hydrALAZINE  25 mg Oral Q8H   pantoprazole  40 mg Oral QAC supper   polyethylene glycol  17 g Oral Daily   rosuvastatin  20 mg Oral QHS   senna-docusate  1 tablet Oral QHS   thyroid  90 mg Oral Daily   Continuous Infusions:  sodium chloride Stopped (08/16/20 1900)   PRN Meds: ondansetron (ZOFRAN) IV, oxyCODONE, traMADol   Vital Signs    Vitals:   08/18/20 0038 08/18/20 0100 08/18/20 0419 08/18/20 0757  BP: (!) 97/50 107/62 (!) 102/54   Pulse: 70 74 (!) 50   Resp: 14 17 12    Temp: 98.1 F (36.7 C)  98.6 F (37 C) 98.7 F (37.1 C)  TempSrc: Oral  Oral Oral  SpO2: 98% 97% 99%   Weight:      Height:        Intake/Output Summary (Last 24 hours) at 08/18/2020 1023 Last data filed at 08/18/2020  0730 Gross per 24 hour  Intake 1182.47 ml  Output 860 ml  Net 322.47 ml    Last 3 Weights 08/16/2020 08/15/2020 08/13/2020  Weight (lbs) 177 lb 4 oz 176 lb 176 lb 8 oz  Weight (kg) 80.4 kg 79.833 kg 80.06 kg      Telemetry    Atrial fib now persisting with controlled ventricular response Personally Reviewed  ECG    No new tracing - Personally Reviewed  Physical Exam  Well developed and nourished in no acute distress chest tube HENT normal Neck supple with JVP-  flat 6-7 Clear Regular rate and rhythm, no murmurs or gallops Abd-soft with active BS No Clubbing cyanosis edema Skin-warm and dry A & Oriented  Grossly normal sensory and motor function     Labs    High Sensitivity Troponin:  No results for input(s): TROPONINIHS in the last 720 hours.    Chemistry Recent Labs  Lab 08/13/20 1434 08/15/20 1555 08/16/20 0314 08/17/20 0027 08/18/20 0101  NA 135   < > 135 133* 136  K 4.6   < > 4.5 5.0 4.9  CL 106   < > 104 101 105  CO2 22   < > 25 26 26   GLUCOSE 103*   < > 138* 121* 112*  BUN 27*   < >  20 31* 35*  CREATININE 1.86*   < > 1.25* 2.00* 1.82*  CALCIUM 8.9   < > 8.1* 8.3* 8.4*  PROT 6.7  --   --  5.4*  --   ALBUMIN 3.5  --   --  2.7*  --   AST 20  --   --  26  --   ALT 16  --   --  11  --   ALKPHOS 74  --   --  54  --   BILITOT 0.8  --   --  0.7  --   GFRNONAA 28*   < > 44* 25* 28*  ANIONGAP 7   < > 6 6 5    < > = values in this interval not displayed.      Hematology Recent Labs  Lab 08/13/20 1434 08/16/20 0314 08/17/20 0027  WBC 7.6 9.9 16.4*  RBC 4.25 3.58* 3.49*  HGB 13.0 11.0* 10.7*  HCT 40.9 34.3* 34.0*  MCV 96.2 95.8 97.4  MCH 30.6 30.7 30.7  MCHC 31.8 32.1 31.5  RDW 14.6 14.3 14.7  PLT 165 121* 148*    Intake/Output Summary (Last 24 hours) at 08/18/2020 1031 Last data filed at 08/18/2020 0730 Gross per 24 hour  Intake 1182.47 ml  Output 860 ml  Net 322.47 ml     BNPNo results for input(s): BNP, PROBNP in the last 168 hours.    DDimer No results for input(s): DDIMER in the last 168 hours.   Radiology    DG Chest Port 1 View  Result Date: 08/17/2020 CLINICAL DATA:  Status post lobectomy. EXAM: PORTABLE CHEST 1 VIEW COMPARISON:  08/16/2020 FINDINGS: Right jugular central line tip is in the lower SVC region. Patient has had a TAVR procedure. Again noted are 2 right chest tubes. Possible pleural line in the mid right chest near the pigtail chest tube which is unchanged and there may be a second small pleural line near the right lung apex. No significant lung consolidation. Heart and mediastinum are stable. No evidence for a left pneumothorax. Small amount of subcutaneous gas in the right neck. Again noted are surgical clips in the right lung. IMPRESSION: 1. Stable position of the right chest tubes. 2. Questionable small pleural lines in the right chest and suspect there may be a small complex pneumothorax. No significant change from the recent comparison examination. Electronically Signed   By: Markus Daft M.D.   On: 08/17/2020 09:24    Cardiac Studies   LHC 07/2014 Mid RCA lesion, 100% stenosed. Ost 1st Diag lesion, 70% stenosed. Ost Cx lesion, 50% stenosed. Mid Cx lesion, 40% stenosed. Ost RCA lesion, 60% stenosed.   Normal LV function with an ejection fraction at a proximally 55% but with evidence for small focal region of mid to basal inferior            hypocontractility. 2+ angiographic mitral regurgitation with mean palmar E wedge pressure at 19 and V wave at 31 mmHg. Severe aortic valve stenosis with a peak to peak gradient of 46 mmHg, and a mean gradient of 39.5 mmHg.  Calculated aortic valve    area 0.64 cm. 1 - 2+ angiographic aortic regurgitation. Significant native coronary obstructive disease with evidence for coronary calcification and 70% proximal first diagonal stenosis; 50% proximal circumflex stenosis before the first marginal branch with 40% AV groove circumflex stenosis; 60% ostial narrowing of the  RCA with 100% mid RCA occlusion after a marginal branch with bridging collaterals supplying the mid  RCA extending from this marginal branch as well as left-to-right collaterals from the distal circumflex and LAD to the distal RCA.   RECOMMENDATION: Surgical consultation will be obtained for aortic valve replacement/CABG revascularization surgery.   2D Echo 06/2019  1. Left ventricular ejection fraction, by estimation, is 60 to 65%. The  left ventricle has normal function. The left ventricle has no regional  wall motion abnormalities. There is moderate left ventricular hypertrophy.  Left ventricular diastolic  parameters are indeterminate.   2. Right ventricular systolic function is normal. The right ventricular  size is normal. There is moderately elevated pulmonary artery systolic  pressure.   3. Left atrial size was moderately dilated.   4. The mitral valve is normal in structure. Mild mitral valve  regurgitation. No evidence of mitral stenosis.   5. Post TAVR with 26 mm Sapien 3 valve stable gradients since 05/19/18 and  no PVL. The aortic valve has been repaired/replaced. Aortic valve  regurgitation is not visualized. No aortic stenosis is present.   6. The inferior vena cava is normal in size with greater than 50%  respiratory variability, suggesting right atrial pressure of 3 mmHg.   Patient Profile     80 y.o. female with a hx of CAD (managed medically), aortic stenosis, PVD s/p aortobifem bypass graft surgery and bilateral fundoplasty by VVS, carotid artery disease (14-43% RICA, 1-54% LICA, LECA >00% by duplex 07/2020), bilateral subclavian stenosis with antegrade flow by duplex 07/2020, renal artery stenosis (>60% L renal artery) by duplex 04/2020, HTN, HLD, paroxysmal atrial fibrillation, bradycardia, LBBB, first degree AVB, stroke who presented for planned thoracoscopy and right upper lobectomy for newly diagnosed RUL lung cancer. Post-op course complicated by junctional  bradycardia/sinus bradycardia with HR 30-40s for which Cardiology has been consulted.  Assessment & Plan    J unctional and Sinus Bradycardia:  First Degree AVB:  Atrial fibrillation-paroxysmal  Aortic stenosis-severe status post TAVR  Peripheral vascular disease with carotid stenosis and prior aortobifem  Right upper lung status post fluoroscopy and right upper lobectomy  Renal injury-acute  Hypertension>> hypotension    Atrial fibrillation is now persisting.  Hopefully reverted to sinus on its own.  Continue amiodarone.  anticoagulation per TCTS and thereafter consideration could be given to cardioversion if she does not revert  Her BP in the past required multiple meds,--not sure why it is lower now but with good eF will stop hydralazine for now   with increasing BUN/creatinine I wonder whether it is fluid.  Have asked her to try to increase her fluid intake.    For questions or updates, please contact [place] Please consult [email or web address] for contact info under        Signed, Virl Axe, MD  08/18/2020, 10:23 AM

## 2020-08-19 ENCOUNTER — Inpatient Hospital Stay (HOSPITAL_COMMUNITY): Payer: Medicare Other

## 2020-08-19 MED ORDER — WARFARIN SODIUM 2.5 MG PO TABS
2.5000 mg | ORAL_TABLET | Freq: Every day | ORAL | Status: DC
  Administered 2020-08-19 – 2020-08-26 (×8): 2.5 mg via ORAL
  Filled 2020-08-19 (×8): qty 1

## 2020-08-19 MED ORDER — WARFARIN - PHYSICIAN DOSING INPATIENT: Freq: Every day | Status: DC

## 2020-08-19 NOTE — Plan of Care (Signed)

## 2020-08-19 NOTE — Progress Notes (Signed)
Progress Note  Patient Name: Katelyn [name] Date of Encounter: 08/19/2020  Primary Cardiologist: Shelva Majestic, MD   Subjective   Feeling well overall, just sleepy.  Inpatient Medications    Scheduled Meds:  acetaminophen  1,000 mg Oral Q6H   Or   acetaminophen (TYLENOL) oral liquid 160 mg/5 mL  1,000 mg Oral Q6H   amiodarone  200 mg Oral Daily   bisacodyl  10 mg Oral Daily   Chlorhexidine Gluconate Cloth  6 each Topical Daily   estradiol  2 mg Oral Daily   pantoprazole  40 mg Oral QAC supper   polyethylene glycol  17 g Oral Daily   rosuvastatin  20 mg Oral QHS   senna-docusate  1 tablet Oral QHS   thyroid  90 mg Oral Daily   warfarin  2.5 mg Oral q1600   Warfarin - Physician Dosing Inpatient   Does not apply q1600   Continuous Infusions:  sodium chloride Stopped (08/16/20 1900)   PRN Meds: ondansetron (ZOFRAN) IV, oxyCODONE, traMADol   Vital Signs    Vitals:   08/18/20 1923 08/18/20 2329 08/19/20 0415 08/19/20 0700  BP: 125/64 (!) 115/93 119/73 (!) 96/52  Pulse: 76 87 82 79  Resp: 15 13 12 15   Temp: 98 F (36.7 C) 98.2 F (36.8 C) 98 F (36.7 C) 98 F (36.7 C)  TempSrc: Oral Oral Oral Oral  SpO2: 98%  94% 98%  Weight:      Height:        Intake/Output Summary (Last 24 hours) at 08/19/2020 0906 Last data filed at 08/19/2020 0500 Gross per 24 hour  Intake 2.3 ml  Output 490 ml  Net -487.7 ml   Filed Weights   08/15/20 0802 08/16/20 0600  Weight: 79.8 kg 80.4 kg    Telemetry    Atrial fibrillation with controlled ventricular response, left bundle branch block- Personally Reviewed  ECG    ECG from 08/15/2020 reviewed, sinus bradycardia left bundle branch block- Personally Reviewed  Physical Exam   GEN: No acute distress.   Neck: No JVD Cardiac: irregular rhythm, normal rate, no murmurs, rubs, or gallops.  Chest: Chest tube in place. Respiratory: Clear to auscultation bilaterally. GI: Soft, nontender, non-distended  MS: No edema; No  deformity. Neuro:  Nonfocal  Psych: Normal affect   Labs    Chemistry Recent Labs  Lab 08/13/20 1434 08/15/20 1555 08/16/20 0314 08/17/20 0027 08/18/20 0101  NA 135   < > 135 133* 136  K 4.6   < > 4.5 5.0 4.9  CL 106   < > 104 101 105  CO2 22   < > 25 26 26   GLUCOSE 103*   < > 138* 121* 112*  BUN 27*   < > 20 31* 35*  CREATININE 1.86*   < > 1.25* 2.00* 1.82*  CALCIUM 8.9   < > 8.1* 8.3* 8.4*  PROT 6.7  --   --  5.4*  --   ALBUMIN 3.5  --   --  2.7*  --   AST 20  --   --  26  --   ALT 16  --   --  11  --   ALKPHOS 74  --   --  54  --   BILITOT 0.8  --   --  0.7  --   GFRNONAA 28*   < > 44* 25* 28*  ANIONGAP 7   < > 6 6 5    < > = values  in this interval not displayed.     Hematology Recent Labs  Lab 08/13/20 1434 08/16/20 0314 08/17/20 0027  WBC 7.6 9.9 16.4*  RBC 4.25 3.58* 3.49*  HGB 13.0 11.0* 10.7*  HCT 40.9 34.3* 34.0*  MCV 96.2 95.8 97.4  MCH 30.6 30.7 30.7  MCHC 31.8 32.1 31.5  RDW 14.6 14.3 14.7  PLT 165 121* 148*    Cardiac EnzymesNo results for input(s): TROPONINI in the last 168 hours. No results for input(s): TROPIPOC in the last 168 hours.   BNPNo results for input(s): BNP, PROBNP in the last 168 hours.   DDimer No results for input(s): DDIMER in the last 168 hours.   Radiology    DG Chest Port 1 View  Result Date: 08/19/2020 CLINICAL DATA:  Status post RIGHT UPPER lobectomy. EXAM: PORTABLE CHEST 1 VIEW COMPARISON:  08/18/2020 FINDINGS: A tiny UPPER RIGHT pneumothorax is noted with unchanged position of 2 RIGHT thoracostomy tubes. Cardiomediastinal silhouette is unchanged with aortic valve replacement noted. Lung volumes are slightly improved. No large pleural effusion identified. No other significant change. IMPRESSION: Tiny UPPER RIGHT pneumothorax with unchanged RIGHT thoracostomy tubes. No other significant change. Electronically Signed   By: Margarette Canada M.D.   On: 08/19/2020 08:43   DG Chest Port 1 View  Result Date: 08/18/2020 CLINICAL  DATA:  Right chest tube placement. EXAM: PORTABLE CHEST 1 VIEW COMPARISON:  August 17, 2020 FINDINGS: Cardiomediastinal silhouette is normal. Mediastinal contours appear intact. Calcific atherosclerotic disease and tortuosity of the aorta. Status post right upper lobectomy. Improvement of right pneumothorax. Two chest tubes in place as before. Osseous structures are without acute abnormality. Right chest wall soft tissue emphysema. IMPRESSION: Status post right upper lobectomy with improved right pneumothorax. Two right chest tubes in place. Electronically Signed   By: Fidela Salisbury M.D.   On: 08/18/2020 11:34    Cardiac Studies   Last echocardiogram 06/22/2019: 60 to 65% LVEF, moderate LVH, moderate LA dilation, stable TAVR prosthesis  Patient Profile     80 y.o. female with CAD (managed medically), aortic stenosis status post TAVR 08/2014, PVD s/p aortobifem bypass graft surgery and bilateral fundoplasty by VVS, carotid artery disease (27-25% RICA, 3-66% LICA, LECA >44% by duplex 07/2020), bilateral subclavian stenosis with antegrade flow by duplex 07/2020, renal artery stenosis (>60% L renal artery) by duplex 04/2020, HTN, HLD, paroxysmal atrial fibrillation, bradycardia, LBBB, first degree AVB, stroke.  Currently hospitalized and status post thoracoscopy and right upper lobe lobectomy for newly diagnosed right upper lobe lung cancer with subsequent sinus bradycardia and then atrial fibrillation.  Assessment & Plan   Active Problems:   S/P Robotic Assisted Right Video Thoracoscopy with Right Upper Lobectomy   Status post thoracotomy  Atrial fibrillation  Persistent atrial fibrillation that is currently rate controlled on amiodarone 200 mg daily.  Continue amiodarone.  She is currently hypotensive and therefore Lopressor has not been added back.  Feeling well overall.  Warfarin plan for resumption this evening per MAR review.      For questions or updates, please contact [place] Please  consult [email or web address] for contact info under        Signed, Elouise Munroe, MD  08/19/2020, 9:06 AM

## 2020-08-19 NOTE — TOC Progression Note (Signed)
Transition of Care [facility]) - Progression Note    Patient Details  Name: Katelyn [name] MRN: [id number] Date of Birth: Oct 23, 1940  [facility]) CM/SW Contact  Zenon Mayo, RN Phone Number: 08/19/2020, 4:47 PM  Clinical Narrative:    NCM spoke with patient and step daughter at bedside, offered choice for HHPT, she states she would like her POA to take a look at the choices , NCM will leave the Medicare .gov list for [place]tho Ket , her POA to go over to choose the agency.  She states she is ok with [employer] supplying the rolling walker for her , she has a bsc at home. She states she has transportation at [employer]. She states she has no issues with getting her medications.         Expected Discharge Plan and Services                                                 Social Determinants of Health (SDOH) Interventions    Readmission Risk Interventions No flowsheet data found.

## 2020-08-19 NOTE — Progress Notes (Addendum)
      [address]       [place] [address]             [phone]      4 Days Post-Op Procedure(s) (LRB): XI ROBOTIC ASSISTED THORASCOPY-RIGHT UPPER LOBECTOMY (Right) INTERCOSTAL NERVE BLOCK RIGHT (Right) NODE DISSECTION (Right)  Subjective:  Patient doing okay.  She complains of pain along her chest tube site.  Overall also feels weak.  She lives alone and thinks might be best for her to go to SNF.  Objective: Vital signs in last 24 hours: Temp:  [98 F (36.7 C)-98.7 F (37.1 C)] 98 F (36.7 C) (06/20 0700) Pulse Rate:  [76-87] 79 (06/20 0700) Cardiac Rhythm: Atrial fibrillation;Bundle branch block (06/20 0701) Resp:  [12-22] 15 (06/20 0700) BP: (96-125)/(52-93) 96/52 (06/20 0700) SpO2:  [94 %-98 %] 98 % (06/20 0700)  Intake/Output from previous day: 06/19 0701 - 06/20 0700 In: 2.3 [I.V.:2.3] Out: 530 [Chest Tube:530]   General appearance: alert, cooperative, and no distress Heart: irregularly irregular rhythm Lungs: clear to auscultation bilaterally Abdomen: soft, non-tender; bowel sounds normal; no masses,  no organomegaly Extremities: extremities normal, atraumatic, no cyanosis or edema Wound: clean and dry  Lab Results: Recent Labs    08/17/20 0027  WBC 16.4*  HGB 10.7*  HCT 34.0*  PLT 148*   BMET:  Recent Labs    08/17/20 0027 08/18/20 0101  NA 133* 136  K 5.0 4.9  CL 101 105  CO2 26 26  GLUCOSE 121* 112*  BUN 31* 35*  CREATININE 2.00* 1.82*  CALCIUM 8.3* 8.4*    PT/INR:  Recent Labs    08/18/20 0101  LABPROT 16.6*  INR 1.3*   ABG    Component Value Date/Time   PHART 7.336 (L) 08/13/2020 1501   HCO3 22.2 08/13/2020 1501   TCO2 25 08/31/2014 1609   ACIDBASEDEF 2.7 (H) 08/13/2020 1501   O2SAT 94.3 08/13/2020 1501   CBG (last 3)  No results for input(s): GLUCAP in the last 72 hours.  Assessment/Plan: S/P Procedure(s) (LRB): XI ROBOTIC ASSISTED THORASCOPY-RIGHT UPPER LOBECTOMY (Right) INTERCOSTAL NERVE BLOCK  RIGHT (Right) NODE DISSECTION (Right)  CV- previous junctional bradycardia, currently in A. Fib with controlled rate--- remains on Amiodarone 200 mg BID, will restart home coumadin, would benefit from resumption of Lopressor once BP allows Pulm- intermittent 1+ air leak with cough, CXR with trace apical space, could place chest tube to water seal.. if CXR remains stable, could d/c blake drain and transition to pigtail catheter Renal- creatinine improved to 1.82, currently on IV fluids at 50 cc/hr, will continue for now K is at 4.9, not on supplement Deconditioning-weak, PT recs home health, she lives alone for the most part may benefit from SNF Dispo- patient stable, remains in rate control A. Fib, resume home coumadin, continue Amiodarone, would benefit from BB once BP allows, continue IV fluids for now, creatinine improving, continue PT/OT   LOS: 4 days   Ellwood Handler, PA-C 08/19/2020  Patient seen and examined, agree with above Still with moderate mostly serous CT output- will leave both tubes today but change to water seal. Ambulate  Remo Lipps C. Roxan Hockey, MD [facility] [phone]

## 2020-08-19 NOTE — Plan of Care (Signed)

## 2020-08-20 ENCOUNTER — Inpatient Hospital Stay (HOSPITAL_COMMUNITY): Payer: Medicare Other

## 2020-08-20 LAB — CBC
HCT: 35.7 % — ABNORMAL LOW (ref 36.0–46.0)
Hemoglobin: 11.4 g/dL — ABNORMAL LOW (ref 12.0–15.0)
MCH: 31.1 pg (ref 26.0–34.0)
MCHC: 31.9 g/dL (ref 30.0–36.0)
MCV: 97.5 fL (ref 80.0–100.0)
Platelets: 150 10*3/uL (ref 150–400)
RBC: 3.66 MIL/uL — ABNORMAL LOW (ref 3.87–5.11)
RDW: 14.6 % (ref 11.5–15.5)
WBC: 8.3 10*3/uL (ref 4.0–10.5)
nRBC: 0 % (ref 0.0–0.2)

## 2020-08-20 LAB — BASIC METABOLIC PANEL
Anion gap: 4 — ABNORMAL LOW (ref 5–15)
BUN: 28 mg/dL — ABNORMAL HIGH (ref 8–23)
CO2: 27 mmol/L (ref 22–32)
Calcium: 8.2 mg/dL — ABNORMAL LOW (ref 8.9–10.3)
Chloride: 105 mmol/L (ref 98–111)
Creatinine, Ser: 1.4 mg/dL — ABNORMAL HIGH (ref 0.44–1.00)
GFR, Estimated: 39 mL/min — ABNORMAL LOW (ref >60)
Glucose, Bld: 109 mg/dL — ABNORMAL HIGH (ref 70–99)
Potassium: 5.1 mmol/L (ref 3.5–5.1)
Sodium: 136 mmol/L (ref 135–145)

## 2020-08-20 LAB — PROTIME-INR
INR: 1.3 — ABNORMAL HIGH (ref 0.8–1.2)
Prothrombin Time: 15.9 seconds — ABNORMAL HIGH (ref 11.4–15.2)

## 2020-08-20 NOTE — Progress Notes (Addendum)
      [address]       [place] [address]             [phone]      5 Days Post-Op Procedure(s) (LRB): XI ROBOTIC ASSISTED THORASCOPY-RIGHT UPPER LOBECTOMY (Right) INTERCOSTAL NERVE BLOCK RIGHT (Right) NODE DISSECTION (Right)  Subjective:  No new complaints.  Denies increase pain,shortness of breath.  Did not ambulate yesterday or today so far.  No BM yet, + passing gas  Objective: Vital signs in last 24 hours: Temp:  [97.6 F (36.4 C)-98.5 F (36.9 C)] 97.7 F (36.5 C) (06/21 0439) Pulse Rate:  [77-98] 78 (06/21 0439) Cardiac Rhythm: Atrial fibrillation (06/21 0500) Resp:  [12-19] 17 (06/21 0439) BP: (101-116)/(52-87) 102/53 (06/21 0439) SpO2:  [90 %-98 %] 90 % (06/21 0439)  Intake/Output from previous day: 06/20 0701 - 06/21 0700 In: 480 [P.O.:480] Out: 1230 [Urine:900; Chest Tube:330]  General appearance: alert, cooperative, and no distress Heart: regular rate and rhythm Lungs: clear to auscultation bilaterally Abdomen: soft, non-tender; bowel sounds normal; no masses,  no organomegaly Extremities: extremities normal, atraumatic, no cyanosis or edema Wound: clean and dry  Lab Results: Recent Labs    08/20/20 0015  WBC 8.3  HGB 11.4*  HCT 35.7*  PLT 150   BMET:  Recent Labs    08/18/20 0101 08/20/20 0015  NA 136 136  K 4.9 5.1  CL 105 105  CO2 26 27  GLUCOSE 112* 109*  BUN 35* 28*  CREATININE 1.82* 1.40*  CALCIUM 8.4* 8.2*    PT/INR:  Recent Labs    08/20/20 0015  LABPROT 15.9*  INR 1.3*   ABG    Component Value Date/Time   PHART 7.336 (L) 08/13/2020 1501   HCO3 22.2 08/13/2020 1501   TCO2 25 08/31/2014 1609   ACIDBASEDEF 2.7 (H) 08/13/2020 1501   O2SAT 94.3 08/13/2020 1501   CBG (last 3)  No results for input(s): GLUCAP in the last 72 hours.  Assessment/Plan: S/P Procedure(s) (LRB): XI ROBOTIC ASSISTED THORASCOPY-RIGHT UPPER LOBECTOMY (Right) INTERCOSTAL NERVE BLOCK RIGHT (Right) NODE DISSECTION  (Right)  CV- PAF, on Amiodarone, BP remains low.. continue to antihypertensive Pulm- CT to waterseal yesterday, increase in air leak today, CXR with increase in right apical pneumothorax, output decreased to 330 cc yesterday Renal- creatinine continues to improve, K is at 5.1, not on supplementation, IV fluids have been stopped Deconditioning- home health PT recommended, will place face to face orders Dispo- patient with increase in air leak and right apical pneumothorax with chest tube on water seal, creatinine continues to improve, K is at 5.1 monitor, chest tube per Dr. Roxan Hockey, continue current care   LOS: 5 days    Ellwood Handler, PA-C 08/20/2020  Patient seen and examined, agree with above Will leave tubes on water seal Ambulate  Remo Lipps C. Roxan Hockey, MD [facility] [phone]

## 2020-08-20 NOTE — Progress Notes (Signed)
Rns flushed pigtail with 5 mL NS. Pigtail flushed well.

## 2020-08-20 NOTE — Plan of Care (Signed)

## 2020-08-20 NOTE — TOC Transition Note (Addendum)
Transition of Care [facility]) - CM/SW Discharge Note   Patient Details  Name: Katelyn [name] MRN: [id number] Date of Birth: 03/29/1940  [facility]) CM/SW Contact:  Zenon Mayo, RN Phone Number: 08/20/2020, 10:15 AM   Clinical Narrative:    NCM spoke with patient at the bedside this am, she states her POA chose [facility] for her for HHPT,HHOT.  NCM made referral to [facility] with [facility] He is able to take referral.  Soc will begin 24 to 48 hrs post dc.  Patient states she has a rolling walker at home  which was her husbands and she does not need a rollator. She will have transportation home at dc.  Will need orders for HHPT, [place]. Patient may need HHRN for mini chest tube express.  NCM notified Corene Cornea with [facility], he will contact this NCM back to make sure they would be able to provide a Nurse. Awaiting call back. Per Corene Cornea with [facility] they can provide a HHRN for the mini chest tube express.  NCM notified [place].    Final next level of care: [place] Barriers to Discharge: Continued Medical Work up   Patient Goals and CMS Choice Patient states their goals for this hospitalization and ongoing recovery are:: return home CMS Medicare.gov Compare Post Acute Care list provided to:: Patient Represenative (must comment) Choice offered to / list presented to : [place] / [place]  Discharge Placement                       Discharge Plan and Services                  DME Agency: NA       HH Arranged: PT, OT,RN [place] Agency: [place] ([facility]) Date HH Agency Contacted: 08/20/20 Time [place]: 1014 Representative spoke with at [place]: [place] ([place]) Interventions     Readmission Risk Interventions No flowsheet data found.

## 2020-08-20 NOTE — Evaluation (Signed)
Occupational Therapy Evaluation Patient Details Name: Katelyn [name] MRN: [id number] DOB: Feb 09, 1941 Today's Date: 08/20/2020    History of Present Illness Pt is a 80 y/o female presenting for XI Robotic Assisted Thorascopy for right upper lobectomy 6/16. PMH includes: heavy tobacco abuse, gold 2 COPD, severe aortic stenosis, TAVR, severe thoracic aortic atherosclerotic disease, coronary artery disease, peripheral arterial disease, stroke, atrial fibrillation, chronic anticoagulation, hypertension, hyperlipidemia, arthritis, and reflux.   Clinical Impression   PTA patient independent and driving. Admitted for above and limited by problem list below, including decreased activity tolerance, impaired balance and generalized weakness.  She requires min assist for transfers, min guard for in room mobility using RW, min assist for UB ADLs and up to mod assist for LB ADLs.  She requires increased time for all activities, rest breaks due to fatigue.  She reports her step daughter will be available at dc to assist.  Based on performance today, believe she will benefit from continued OT services while admitted and after dc at [facility] level to optimize independence and safety with ADLS, mobility.     Follow Up Recommendations  Home health OT;Supervision/Assistance - 24 hour (inital 24/7)    Equipment Recommendations  None recommended by OT    Recommendations for Other Services       Precautions / Restrictions Precautions Precautions: Fall Precaution Comments: chest tube Restrictions Weight Bearing Restrictions: No      Mobility Bed Mobility Overal bed mobility: Needs Assistance Bed Mobility: Supine to Sit     Supine to sit: Min guard     General bed mobility comments: increased time and effort, min guard for safety and line mgmt    Transfers Overall transfer level: Needs assistance Equipment used: Rolling walker (2 wheeled) Transfers: Sit to/from Stand Sit to Stand: Min assist          General transfer comment: min assist to power up and steady from EOB and BSC, cueing for hand placement and increased time required    Balance Overall balance assessment: Needs assistance Sitting-balance support: No upper extremity supported;Feet supported Sitting balance-Leahy Scale: Fair     Standing balance support: Bilateral upper extremity supported;During functional activity Standing balance-Leahy Scale: Poor Standing balance comment: relies on BUE support                           ADL either performed or assessed with clinical judgement   ADL Overall ADL's : Needs assistance/impaired     Grooming: Set up;Sitting;Wash/dry face;Oral care   Upper Body Bathing: Minimal assistance;Sitting   Lower Body Bathing: Moderate assistance;Sit to/from stand   Upper Body Dressing : Minimal assistance;Sitting   Lower Body Dressing: Maximal assistance;Sit to/from stand Lower Body Dressing Details (indicate cue type and reason): min assist sit to stand, assist for socks and relies on BUE support in standing Toilet Transfer: Minimal assistance;Ambulation;BSC;RW   Toileting- Clothing Manipulation and Hygiene: Minimal assistance;Sitting/lateral lean;Sit to/from stand       Functional mobility during ADLs: Rolling walker;Min guard General ADL Comments: pt limited by decreased activity tolerance and endurance     Vision Baseline Vision/History: No visual deficits Patient Visual Report:  (hx of cataract surgery) Vision Assessment?: No apparent visual deficits     Perception     Praxis      Pertinent Vitals/Pain Pain Assessment: Faces Faces Pain Scale: Hurts even more Pain Location: R chest and back Pain Descriptors / Indicators: Discomfort Pain Intervention(s): Limited activity within patient's tolerance;Monitored  during session;Repositioned     Hand Dominance Right   Extremity/Trunk Assessment Upper Extremity Assessment Upper Extremity Assessment: Generalized  weakness   Lower Extremity Assessment Lower Extremity Assessment: Generalized weakness   Cervical / Trunk Assessment Cervical / Trunk Assessment: Kyphotic   Communication Communication Communication: No difficulties   Cognition Arousal/Alertness: Awake/alert Behavior During Therapy: WFL for tasks assessed/performed Overall Cognitive Status: Within Functional Limits for tasks assessed                                     General Comments  pt on RA during session, with good waveform pt SpO2 >92%; HR 97-136 during activity and BP pre 118/78 post 111/92    Exercises     Shoulder Instructions      Home Living Family/patient expects to be discharged to:: Private residence Living Arrangements: Alone Available Help at Discharge: Family;Available PRN/intermittently Type of Home: House Home Access: Stairs to enter [employer] of Steps: 3 Entrance Stairs-Rails: None Home Layout: One level     Bathroom Shower/Tub: Teacher, early years/pre: Handicapped height     Home Equipment: Shower seat;Bedside commode;Walker - 2 wheels;Grab bars - tub/shower   Additional Comments: step daughter living with pt currently      Prior Functioning/Environment Level of Independence: Independent        Comments: pt independent with ADLs and drives        OT Problem List: Decreased strength;Decreased activity tolerance;Impaired balance (sitting and/or standing);Decreased safety awareness;Decreased knowledge of use of DME or AE;Decreased knowledge of precautions;Cardiopulmonary status limiting activity;Pain      OT Treatment/Interventions: Self-care/ADL training;Energy conservation;DME and/or AE instruction;Therapeutic activities;Balance training;Patient/family education    OT Goals(Current goals can be found in the care plan section) Acute Rehab OT Goals Patient Stated Goal: get better and go home OT Goal Formulation: With patient Time For Goal  Achievement: 09/03/20 Potential to Achieve Goals: Good  OT Frequency: Min 2X/week   Barriers to D/C:            Co-evaluation              AM-PAC OT "6 Clicks" Daily Activity     Outcome Measure Help from another person eating meals?: None Help from another person taking care of personal grooming?: A Little Help from another person toileting, which includes using toliet, bedpan, or urinal?: A Little Help from another person bathing (including washing, rinsing, drying)?: A Lot Help from another person to put on and taking off regular upper body clothing?: A Little Help from another person to put on and taking off regular lower body clothing?: A Lot 6 Click Score: 17   End of Session Equipment Utilized During Treatment: Rolling walker Nurse Communication: Mobility status  Activity Tolerance: Patient tolerated treatment well Patient left: in chair;with call bell/phone within reach  OT Visit Diagnosis: Other abnormalities of gait and mobility (R26.89);Muscle weakness (generalized) (M62.81);Pain Pain - Right/Left: Right Pain - part of body:  (chest tube site)                Time: 5462-7035 OT Time Calculation (min): 33 min Charges:  OT General Charges $OT Visit: 1 Visit OT Evaluation $OT Eval Moderate Complexity: 1 Mod OT Treatments $Self Care/Home Management : 8-22 mins  Jolaine Artist, OT Acute Rehabilitation Services Pager [phone] Office [phone]   Katelyn [name] 08/20/2020, 10:08 AM

## 2020-08-20 NOTE — Progress Notes (Signed)
Progress Note  Patient Name: Katelyn [name] Date of Encounter: 08/20/2020  Primary Cardiologist: Shelva Majestic, MD   Subjective   Sitting up in chair, feels well but tired after PT.   Inpatient Medications    Scheduled Meds:  acetaminophen  1,000 mg Oral Q6H   Or   acetaminophen (TYLENOL) oral liquid 160 mg/5 mL  1,000 mg Oral Q6H   amiodarone  200 mg Oral Daily   bisacodyl  10 mg Oral Daily   estradiol  2 mg Oral Daily   pantoprazole  40 mg Oral QAC supper   polyethylene glycol  17 g Oral Daily   rosuvastatin  20 mg Oral QHS   senna-docusate  1 tablet Oral QHS   thyroid  90 mg Oral Daily   warfarin  2.5 mg Oral q1600   Warfarin - Physician Dosing Inpatient   Does not apply q1600   Continuous Infusions:   PRN Meds: ondansetron (ZOFRAN) IV, oxyCODONE, traMADol   Vital Signs    Vitals:   08/19/20 2000 08/19/20 2353 08/20/20 0439 08/20/20 0800  BP: 109/86 101/87 (!) 102/53 (!) 117/59  Pulse: 98 95 78 89  Resp: 19 13 17 14   Temp: 97.6 F (36.4 C) 98 F (36.7 C) 97.7 F (36.5 C) 97.7 F (36.5 C)  TempSrc: Oral Oral Oral Oral  SpO2: 92% 94% 90% 91%  Weight:      Height:        Intake/Output Summary (Last 24 hours) at 08/20/2020 0926 Last data filed at 08/20/2020 0820 Gross per 24 hour  Intake 440 ml  Output 1230 ml  Net -790 ml   Filed Weights   08/15/20 0802 08/16/20 0600  Weight: 79.8 kg 80.4 kg    Telemetry    Atrial fibrillation with some RVR, left bundle branch block- Personally Reviewed  ECG    ECG from 08/15/2020 reviewed, sinus bradycardia left bundle branch block- Personally Reviewed  Physical Exam   GEN: No acute distress.   Neck: No JVD Cardiac: iRRR, no murmurs, rubs, or gallops.  Respiratory: Clear to auscultation bilaterally. GI: Soft, nontender, non-distended  MS: No edema; No deformity. Neuro:  Nonfocal  Psych: Normal affect    Labs    Chemistry Recent Labs  Lab 08/13/20 1434 08/15/20 1555 08/17/20 0027 08/18/20 0101  08/20/20 0015  NA 135   < > 133* 136 136  K 4.6   < > 5.0 4.9 5.1  CL 106   < > 101 105 105  CO2 22   < > 26 26 27   GLUCOSE 103*   < > 121* 112* 109*  BUN 27*   < > 31* 35* 28*  CREATININE 1.86*   < > 2.00* 1.82* 1.40*  CALCIUM 8.9   < > 8.3* 8.4* 8.2*  PROT 6.7  --  5.4*  --   --   ALBUMIN 3.5  --  2.7*  --   --   AST 20  --  26  --   --   ALT 16  --  11  --   --   ALKPHOS 74  --  54  --   --   BILITOT 0.8  --  0.7  --   --   GFRNONAA 28*   < > 25* 28* 39*  ANIONGAP 7   < > 6 5 4*   < > = values in this interval not displayed.     Hematology Recent Labs  Lab 08/16/20 [phone] 08/17/20 3244  08/20/20 0015  WBC 9.9 16.4* 8.3  RBC 3.58* 3.49* 3.66*  HGB 11.0* 10.7* 11.4*  HCT 34.3* 34.0* 35.7*  MCV 95.8 97.4 97.5  MCH 30.7 30.7 31.1  MCHC 32.1 31.5 31.9  RDW 14.3 14.7 14.6  PLT 121* 148* 150    Cardiac EnzymesNo results for input(s): TROPONINI in the last 168 hours. No results for input(s): TROPIPOC in the last 168 hours.   BNPNo results for input(s): BNP, PROBNP in the last 168 hours.   DDimer No results for input(s): DDIMER in the last 168 hours.   Radiology    DG CHEST PORT 1 VIEW  Result Date: 08/20/2020 CLINICAL DATA:  Chest tube.  Pneumothorax. EXAM: PORTABLE CHEST 1 VIEW COMPARISON:  08/19/2020. FINDINGS: Two right chest tubes noted in stable position. Interim slight progression of right-sided pneumothorax. Postsurgical changes right lung. Mild right base atelectasis. Small right pleural effusion noted on today's exam. Prior cardiac valve replacement. Heart size stable. No acute bony abnormality. IMPRESSION: 1. Two right chest tubes noted stable position. Interim slight progression of right-sided pneumothorax. Postsurgical changes right lung. Mild right base atelectasis. Small right pleural effusion noted on today's exam. 2.  Prior cardiac valve replacement.  Heart size stable. Electronically Signed   By: Marcello Moores  Register   On: 08/20/2020 09:20   DG Chest Port 1  View  Result Date: 08/19/2020 CLINICAL DATA:  Status post RIGHT UPPER lobectomy. EXAM: PORTABLE CHEST 1 VIEW COMPARISON:  08/18/2020 FINDINGS: A tiny UPPER RIGHT pneumothorax is noted with unchanged position of 2 RIGHT thoracostomy tubes. Cardiomediastinal silhouette is unchanged with aortic valve replacement noted. Lung volumes are slightly improved. No large pleural effusion identified. No other significant change. IMPRESSION: Tiny UPPER RIGHT pneumothorax with unchanged RIGHT thoracostomy tubes. No other significant change. Electronically Signed   By: Margarette Canada M.D.   On: 08/19/2020 08:43    Cardiac Studies   Last echocardiogram 06/22/2019: 60 to 65% LVEF, moderate LVH, moderate LA dilation, stable TAVR prosthesis  Patient Profile     80 y.o. female with CAD (managed medically), aortic stenosis status post TAVR 08/2014, PVD s/p aortobifem bypass graft surgery and bilateral fundoplasty by VVS, carotid artery disease (35-45% RICA, 6-25% LICA, LECA >63% by duplex 07/2020), bilateral subclavian stenosis with antegrade flow by duplex 07/2020, renal artery stenosis (>60% L renal artery) by duplex 04/2020, HTN, HLD, paroxysmal atrial fibrillation, bradycardia, LBBB, first degree AVB, stroke.  Currently hospitalized and status post thoracoscopy and right upper lobe lobectomy for newly diagnosed right upper lobe lung cancer with subsequent sinus bradycardia and then atrial fibrillation.  Assessment & Plan   Active Problems:   S/P Robotic Assisted Right Video Thoracoscopy with Right Upper Lobectomy   Status post thoracotomy  Atrial fibrillation  Persistent atrial fibrillation that is mostly rate controlled but slightly elevated rates after working with PT and sitting up in chair. On amiodarone 200 mg daily.  Continue amiodarone.  She is currently hypotensive and therefore Lopressor has not been added back.  Feeling well overall.  Warfarin restarted yesterday afternoon.      For questions or updates,  please contact [place] Please consult [email or web address] for contact info under        Signed, Elouise Munroe, MD  08/20/2020, 9:26 AM

## 2020-08-20 NOTE — Progress Notes (Signed)
Physical Therapy Treatment Patient Details Name: Katelyn [name] MRN: [id number] DOB: 1940-10-21 Today's Date: 08/20/2020    History of Present Illness Pt is a 80 y/o female presenting for XI Robotic Assisted Thorascopy for right upper lobectomy 6/16. Pt with chest tube. PMH includes: heavy tobacco abuse, gold 2 COPD, severe aortic stenosis, TAVR, severe thoracic aortic atherosclerotic disease, coronary artery disease, peripheral arterial disease, stroke, atrial fibrillation, chronic anticoagulation, hypertension, hyperlipidemia, arthritis, and reflux.    PT Comments    Pt was limited today due to orthostatic hypotension.   Pt reports that she felt weak and sweaty when she got up earlier. Checked orthostatic BP during PT session.  BP in sitting was 125/79  initial stand 131/116 but arm tense and pt with orthostatic symptoms  Returned to sitting BP 117/85  Stood again with therapist holding arm to keep it relaxed and BP was 66/54 with pt feeling weak and syncopal symptoms (darkening vision)   Returned to sitting symptoms resolved and BP 91/77.     Follow Up Recommendations  Home health PT     Equipment Recommendations  Rolling walker with 5" wheels    Recommendations for Other Services       Precautions / Restrictions Precautions Precautions: Fall Precaution Comments: chest tube Restrictions Weight Bearing Restrictions: No    Mobility  Bed Mobility Overal bed mobility: Needs Assistance Bed Mobility: Supine to Sit     Supine to sit: Min guard     General bed mobility comments: in chair at arrival    Transfers Overall transfer level: Needs assistance Equipment used: Rolling walker (2 wheeled) Transfers: Sit to/from Stand Sit to Stand: Min assist         General transfer comment: Performed x 2 from chair  with increased time and min A to rise  Ambulation/Gait         Gait velocity: reduced   General Gait Details: limited due to orthostatic  BP   Stairs             Wheelchair Mobility    Modified Rankin (Stroke Patients Only)       Balance Overall balance assessment: Needs assistance Sitting-balance support: No upper extremity supported;Feet supported Sitting balance-Leahy Scale: Good     Standing balance support: During functional activity;Bilateral upper extremity supported Standing balance-Leahy Scale: Poor Standing balance comment: relies on BUE support                            Cognition Arousal/Alertness: Awake/alert Behavior During Therapy: WFL for tasks assessed/performed Overall Cognitive Status: Within Functional Limits for tasks assessed                                        Exercises      General Comments General comments (skin integrity, edema, etc.): Pt on RA with O2 sats >90%.  HR 97-125 bpm.  Pt reports that she felt weak and sweaty when she got up earlier. Checked orthostatic BP. BP in sitting was 125/79, initial stand 131/116 but arm tense and pt with orthostatic symptoms.  Returned to sitting BP 117/85.  Stood again with therapist holding arm to keep it relaxed and BP was 66/54 with pt feeling weak and syncopal symptoms (darkening vision).  Returned to sitting symptoms resolved and BP 91/77.  Notified RN.      Pertinent Vitals/Pain Pain Assessment:  Faces Faces Pain Scale: Hurts a little bit Pain Location: R chest and back Pain Descriptors / Indicators: Discomfort Pain Intervention(s): Limited activity within patient's tolerance;Monitored during session    Home Living Family/patient expects to be discharged to:: Private residence Living Arrangements: Alone Available Help at Discharge: Family;Available PRN/intermittently Type of Home: House Home Access: Stairs to enter Entrance Stairs-Rails: None Home Layout: One level Home Equipment: Shower seat;Bedside commode;Walker - 2 wheels;Grab bars - tub/shower Additional Comments: step daughter living with  pt currently    Prior Function Level of Independence: Independent      Comments: pt independent with ADLs and drives   PT Goals (current goals can now be found in the care plan section) Acute Rehab PT Goals Patient Stated Goal: get better and go home PT Goal Formulation: With patient Time For Goal Achievement: 08/31/20 Potential to Achieve Goals: Good Progress towards PT goals: Not progressing toward goals - comment (limited due to orthostatic hypotension)    Frequency    Min 3X/week      PT Plan Current plan remains appropriate    Co-evaluation              AM-PAC PT "6 Clicks" Mobility   Outcome Measure  Help needed turning from your back to your side while in a flat bed without using bedrails?: A Little Help needed moving from lying on your back to sitting on the side of a flat bed without using bedrails?: A Little Help needed moving to and from a bed to a chair (including a wheelchair)?: A Little Help needed standing up from a chair using your arms (e.g., wheelchair or bedside chair)?: A Little Help needed to walk in hospital room?: A Little Help needed climbing 3-5 steps with a railing? : A Little 6 Click Score: 18    End of Session Equipment Utilized During Treatment: Gait belt Activity Tolerance: Other (comment) (Limited due to orthostatic hypotension) Patient left: with call bell/phone within reach;in chair Nurse Communication: Mobility status PT Visit Diagnosis: Other abnormalities of gait and mobility (R26.89);Muscle weakness (generalized) (M62.81);Difficulty in walking, not elsewhere classified (R26.2);Pain Pain - Right/Left: Right     Time: 1120-1140 PT Time Calculation (min) (ACUTE ONLY): 20 min  Charges:  $Therapeutic Activity: 8-22 mins                     Abran Richard, PT Acute Rehab Services Pager [phone] [facility] Rehab [phone]    Karlton Lemon 08/20/2020, 11:51 AM

## 2020-08-21 ENCOUNTER — Inpatient Hospital Stay (HOSPITAL_COMMUNITY): Payer: Medicare Other

## 2020-08-21 LAB — BASIC METABOLIC PANEL
Anion gap: 8 (ref 5–15)
BUN: 24 mg/dL — ABNORMAL HIGH (ref 8–23)
CO2: 23 mmol/L (ref 22–32)
Calcium: 8.5 mg/dL — ABNORMAL LOW (ref 8.9–10.3)
Chloride: 102 mmol/L (ref 98–111)
Creatinine, Ser: 1.16 mg/dL — ABNORMAL HIGH (ref 0.44–1.00)
GFR, Estimated: 49 mL/min — ABNORMAL LOW (ref >60)
Glucose, Bld: 111 mg/dL — ABNORMAL HIGH (ref 70–99)
Potassium: 4.6 mmol/L (ref 3.5–5.1)
Sodium: 133 mmol/L — ABNORMAL LOW (ref 135–145)

## 2020-08-21 LAB — SURGICAL PATHOLOGY

## 2020-08-21 LAB — PROTIME-INR
INR: 1.3 — ABNORMAL HIGH (ref 0.8–1.2)
Prothrombin Time: 15.7 seconds — ABNORMAL HIGH (ref 11.4–15.2)

## 2020-08-21 MED ORDER — METOPROLOL SUCCINATE ER 25 MG PO TB24
25.0000 mg | ORAL_TABLET | Freq: Every day | ORAL | Status: DC
  Administered 2020-08-21: 25 mg via ORAL
  Filled 2020-08-21: qty 1

## 2020-08-21 NOTE — Progress Notes (Signed)
RN and NT attempted to ambulate pt. Pt stood at bedside and immediately said she felt very weak and "sweaty." Pt diaphoretic and tachycardic, with HR in mid-to-upper 130s. Pt refused to take any steps and said she needed to sit down. BP after sitting was 125/54 with MAP of 75. Gave pt time to rest for several mins to rest. RN and NT stood pt again. RN attempted to get BP while pt was standing but pt was unable to tolerate standing that long and said she needed to sit. Bp that was cycling while she was sitting back down was 106/62 with MAP of 74. RN will continue to monitor.   RN and NT encouraged pt and attempted to ambulate her in room again in the afternoon, and pt was only able to take a few steps before needing to sit down.

## 2020-08-21 NOTE — Plan of Care (Signed)

## 2020-08-21 NOTE — Progress Notes (Addendum)
      [address]       [place], [address]             [phone]      6 Days Post-Op Procedure(s) (LRB): XI ROBOTIC ASSISTED THORASCOPY-RIGHT UPPER LOBECTOMY (Right) INTERCOSTAL NERVE BLOCK RIGHT (Right) NODE DISSECTION (Right)  Subjective:  Patient has no new complaints.  She continues to have pain at her chest tube site.  She did try to ambulate yesterday, but she states her BP drops when ambulating.    Objective: Vital signs in last 24 hours: Temp:  [97.7 F (36.5 C)-98.3 F (36.8 C)] 98.3 F (36.8 C) (06/22 0357) Pulse Rate:  [89-122] 96 (06/22 0357) Cardiac Rhythm: Normal sinus rhythm (06/22 0412) Resp:  [14-24] 16 (06/22 0357) BP: (117-157)/(59-90) 128/63 (06/22 0357) SpO2:  [91 %-95 %] 93 % (06/22 0357)  Intake/Output from previous day: 06/21 0701 - 06/22 0700 In: 220 [P.O.:220] Out: 1710 [Urine:1400; Chest Tube:310]   General appearance: alert, cooperative, and no distress Heart: irregularly irregular rhythm Lungs: clear to auscultation bilaterally Abdomen: soft, non-tender; bowel sounds normal; no masses,  no organomegaly Wound: clean and dry  Lab Results: Recent Labs    08/20/20 0015  WBC 8.3  HGB 11.4*  HCT 35.7*  PLT 150   BMET:  Recent Labs    08/20/20 0015 08/21/20 0042  NA 136 133*  K 5.1 4.6  CL 105 102  CO2 27 23  GLUCOSE 109* 111*  BUN 28* 24*  CREATININE 1.40* 1.16*  CALCIUM 8.2* 8.5*    PT/INR:  Recent Labs    08/21/20 0042  LABPROT 15.7*  INR 1.3*   ABG    Component Value Date/Time   PHART 7.336 (L) 08/13/2020 1501   HCO3 22.2 08/13/2020 1501   TCO2 25 08/31/2014 1609   ACIDBASEDEF 2.7 (H) 08/13/2020 1501   O2SAT 94.3 08/13/2020 1501   CBG (last 3)  No results for input(s): GLUCAP in the last 72 hours.  Assessment/Plan: S/P Procedure(s) (LRB): XI ROBOTIC ASSISTED THORASCOPY-RIGHT UPPER LOBECTOMY (Right) INTERCOSTAL NERVE BLOCK RIGHT (Right) NODE DISSECTION (Right)  CV- PAF, chronic-  currently in the 130s- will continue Amiodarone, BP is elevated into 150s during the day, will restart home Toprol XL for additional HR control if she can tolerate, could re-bolus Amiodarone as well, will discuss with Dr. Roxan Hockey  INR 1.3, continue coumadin at home regimen of 2.5 mg daily, I suspect delayed response with Amiodarone Pulm- CT output 310 cc yesterday (new pleurovac level currently at 150 cc ), 1+ air leak with cough.. CXR with pneumothorax on right.. patient is tolerating, ? Place back to suction to see if we can lung back up? Defer to Dr. Roxan Hockey Renal- creatinine continues to improve, K is WNL Deconditioning- mobility is currently limited due to orthostasis, home health has been arranged Dispo-patient with A. Fib rates into the 130s today, BP improved... will continue Amiodarone, restart home Toprol XL for additional HR control if BP allows, CT output remains elevated but stable, + air leak, pneumothorax on CXR... patient tolerating, defer management to Dr. Roxan Hockey, Renal function much improved, continue current care   LOS: 6 days    Ellwood Handler, PA-C 08/21/2020 Did not do well with ambulation yesterday- will push today Still with air leak, drainage down and is serous- will dc Blake drain and keep pigtail on water seal Home when ambulating better, HR controlled  Remo Lipps C. Roxan Hockey, MD [facility] [phone]

## 2020-08-21 NOTE — Plan of Care (Signed)
  Problem: Education: Goal: Knowledge of General Education information will improve Description: Including pain rating scale, medication(s)/side effects and non-pharmacologic comfort measures Outcome: Progressing   Problem: Health Behavior/Discharge Planning: Goal: Ability to manage health-related needs will improve Outcome: Progressing   Problem: Clinical Measurements: Goal: Ability to maintain clinical measurements within normal limits will improve Outcome: Progressing Goal: Will remain free from infection Outcome: Progressing Goal: Diagnostic test results will improve Outcome: Progressing   Problem: Nutrition: Goal: Adequate nutrition will be maintained Outcome: Progressing   Problem: Coping: Goal: Level of anxiety will decrease Outcome: Progressing   Problem: Elimination: Goal: Will not experience complications related to bowel motility Outcome: Progressing Goal: Will not experience complications related to urinary retention Outcome: Progressing   Problem: Pain Managment: Goal: General experience of comfort will improve Outcome: Progressing   Problem: Safety: Goal: Ability to remain free from injury will improve Outcome: Progressing   Problem: Skin Integrity: Goal: Risk for impaired skin integrity will decrease Outcome: Progressing

## 2020-08-21 NOTE — Progress Notes (Signed)
Progress Note  Patient Name: Katelyn [name] Date of Encounter: 08/21/2020  Primary Cardiologist: Shelva Majestic, MD   Subjective   Lying in bed, daughter [name] at the bedside.   Inpatient Medications    Scheduled Meds:  amiodarone  200 mg Oral Daily   bisacodyl  10 mg Oral Daily   estradiol  2 mg Oral Daily   metoprolol succinate  25 mg Oral Daily   pantoprazole  40 mg Oral QAC supper   polyethylene glycol  17 g Oral Daily   rosuvastatin  20 mg Oral QHS   senna-docusate  1 tablet Oral QHS   thyroid  90 mg Oral Daily   warfarin  2.5 mg Oral q1600   Warfarin - Physician Dosing Inpatient   Does not apply q1600   Continuous Infusions:   PRN Meds: ondansetron (ZOFRAN) IV, oxyCODONE, traMADol   Vital Signs    Vitals:   08/20/20 2016 08/20/20 2327 08/21/20 0357 08/21/20 0726  BP: (!) 157/76 135/61 128/63 140/79  Pulse: (!) 115 (!) 107 96 (!) 105  Resp: 16 15 16 18   Temp: 97.7 F (36.5 C) 98.1 F (36.7 C) 98.3 F (36.8 C) 97.8 F (36.6 C)  TempSrc: Oral Oral Oral Oral  SpO2: 95% 93% 93% 91%  Weight:      Height:        Intake/Output Summary (Last 24 hours) at 08/21/2020 0833 Last data filed at 08/21/2020 0741 Gross per 24 hour  Intake 20 ml  Output 1710 ml  Net -1690 ml   Filed Weights   08/15/20 0802 08/16/20 0600  Weight: 79.8 kg 80.4 kg    Telemetry    Atrial fibrillation with some RVR, left bundle branch block- Personally Reviewed  ECG    ECG from 08/15/2020 reviewed, sinus bradycardia left bundle branch block- Personally Reviewed  Physical Exam   GEN: No acute distress.   Neck: No JVD Cardiac: iRRR, no murmurs, rubs, or gallops.  Respiratory: Clear to auscultation bilaterally. GI: Soft, nontender, non-distended  MS: No edema; No deformity. Neuro:  Nonfocal  Psych: Normal affect    Labs    Chemistry Recent Labs  Lab 08/17/20 0027 08/18/20 0101 08/20/20 0015 08/21/20 0042  NA 133* 136 136 133*  K 5.0 4.9 5.1 4.6  CL 101 105 105 102   CO2 26 26 27 23   GLUCOSE 121* 112* 109* 111*  BUN 31* 35* 28* 24*  CREATININE 2.00* 1.82* 1.40* 1.16*  CALCIUM 8.3* 8.4* 8.2* 8.5*  PROT 5.4*  --   --   --   ALBUMIN 2.7*  --   --   --   AST 26  --   --   --   ALT 11  --   --   --   ALKPHOS 54  --   --   --   BILITOT 0.7  --   --   --   GFRNONAA 25* 28* 39* 49*  ANIONGAP 6 5 4* 8     Hematology Recent Labs  Lab 08/16/20 0314 08/17/20 0027 08/20/20 0015  WBC 9.9 16.4* 8.3  RBC 3.58* 3.49* 3.66*  HGB 11.0* 10.7* 11.4*  HCT 34.3* 34.0* 35.7*  MCV 95.8 97.4 97.5  MCH 30.7 30.7 31.1  MCHC 32.1 31.5 31.9  RDW 14.3 14.7 14.6  PLT 121* 148* 150    Cardiac EnzymesNo results for input(s): TROPONINI in the last 168 hours. No results for input(s): TROPIPOC in the last 168 hours.   BNPNo results  for input(s): BNP, PROBNP in the last 168 hours.   DDimer No results for input(s): DDIMER in the last 168 hours.   Radiology    DG CHEST PORT 1 VIEW  Result Date: 08/21/2020 CLINICAL DATA:  Chest tube.  Right pneumothorax. EXAM: PORTABLE CHEST 1 VIEW COMPARISON:  08/20/2020. FINDINGS: Right chest tubes in stable position. Postsurgical changes right lung with persistent right pneumothorax. Pneumothorax may have increased slightly from prior exam. Left apical pleural thickening consistent scarring again noted. Prior cardiac valve replacement. Heart size stable no pulmonary venous congestion. Mild right chest wall subcutaneous emphysema again noted. IMPRESSION: 1. Two right chest tubes in stable position. Postsurgical changes right lung with persistent right pneumothorax. Pneumothorax may have increased slightly from prior exam. Mild right chest wall subcutaneous emphysema again noted. 2.  Prior cardiac valve replacement.  Heart size stable. Electronically Signed   By: Marcello Moores  Register   On: 08/21/2020 08:27   DG CHEST PORT 1 VIEW  Result Date: 08/20/2020 CLINICAL DATA:  Chest tube.  Pneumothorax. EXAM: PORTABLE CHEST 1 VIEW COMPARISON:   08/19/2020. FINDINGS: Two right chest tubes noted in stable position. Interim slight progression of right-sided pneumothorax. Postsurgical changes right lung. Mild right base atelectasis. Small right pleural effusion noted on today's exam. Prior cardiac valve replacement. Heart size stable. No acute bony abnormality. IMPRESSION: 1. Two right chest tubes noted stable position. Interim slight progression of right-sided pneumothorax. Postsurgical changes right lung. Mild right base atelectasis. Small right pleural effusion noted on today's exam. 2.  Prior cardiac valve replacement.  Heart size stable. Electronically Signed   By: Marcello Moores  Register   On: 08/20/2020 09:20    Cardiac Studies   Last echocardiogram 06/22/2019: 60 to 65% LVEF, moderate LVH, moderate LA dilation, stable TAVR prosthesis  Patient Profile     80 y.o. female with CAD (managed medically), aortic stenosis status post TAVR 08/2014, PVD s/p aortobifem bypass graft surgery and bilateral fundoplasty by [facility], carotid artery disease (47-42% RICA, 5-95% LICA, LECA >63% by duplex 07/2020), bilateral subclavian stenosis with antegrade flow by duplex 07/2020, renal artery stenosis (>60% L renal artery) by duplex 04/2020, HTN, HLD, paroxysmal atrial fibrillation, bradycardia, LBBB, first degree AVB, stroke.  Currently hospitalized and status post thoracoscopy and right upper lobe lobectomy for newly diagnosed right upper lobe lung cancer with subsequent sinus bradycardia and then atrial fibrillation.  Assessment & Plan   Active Problems:   S/P Robotic Assisted Right Video Thoracoscopy with Right Upper Lobectomy   Status post thoracotomy  Atrial fibrillation  Persistent atrial fibrillation, mildly elevated rates and mildly elevated BP. Agree with TCTS resuming metoprolol, this will assist with rate control. She feels well overall.       For questions or updates, please contact [place] Please consult [email or web address] for contact info under         Signed, Elouise Munroe, MD  08/21/2020, 8:33 AM

## 2020-08-21 NOTE — Progress Notes (Signed)
Physical Therapy Treatment Patient Details Name: Katelyn [name] MRN: [id number] DOB: [birth year] Today's Date: 08/21/2020    History of Present Illness Pt is a 80 y/o female presenting for XI Robotic Assisted Thorascopy for right upper lobectomy 6/16. Pt with chest tube. PMH includes: heavy tobacco abuse, gold 2 COPD, severe aortic stenosis, TAVR, severe thoracic aortic atherosclerotic disease, coronary artery disease, peripheral arterial disease, stroke, atrial fibrillation, chronic anticoagulation, hypertension, hyperlipidemia, arthritis, and reflux.    PT Comments    Pt continues to be limited in functional mobility secondary to symptomatic orthostatic hypotension. Pt tolerates transfers, requiring physical assistance and cues for hand placement to perform. Pt encouraged to attempt marching in place, with immediate reports of dizziness and requests to return to sitting. Pt will benefit from continued acute PT to improve functional mobility and activity tolerance. SPT recommends SNF placement to improve independence in mobility and aid in return to prior level, however family refusing SNF recommendation at this time. If family continues to refuse SNF placement, SPT recommends HHPT with assistance for all OOB mobility.   Follow Up Recommendations  SNF;Supervision for mobility/OOB (HHPT if refuse with supervision for all OOB mobility.)     Equipment Recommendations  Rolling walker with 5" wheels    Recommendations for Other Services       Precautions / Restrictions Precautions Precautions: Fall;Other (comment) Precaution Comments: chest tube, watch BP orthostatic Restrictions Weight Bearing Restrictions: No    Mobility  Bed Mobility Overal bed mobility: Needs Assistance Bed Mobility: Supine to Sit     Supine to sit: Min assist Sit to supine: HOB elevated   General bed mobility comments: min A to pull up to sit and for managing chest tube    Transfers Overall transfer level:  Needs assistance Equipment used: Rolling walker (2 wheeled) Transfers: Sit to/from Stand (x 3) Sit to Stand: Min assist;+2 physical assistance         General transfer comment: min A + 2 with cues for hand placement. increased time to rise  Ambulation/Gait             General Gait Details: limited due to orthostatic BP, attempted marching in place with RW and pt reports needing to sit due to dizziness.   Stairs             Wheelchair Mobility    Modified Rankin (Stroke Patients Only)       Balance Overall balance assessment: Needs assistance Sitting-balance support: No upper extremity supported;Feet supported Sitting balance-Leahy Scale: Fair     Standing balance support: During functional activity;Bilateral upper extremity supported Standing balance-Leahy Scale: Poor Standing balance comment: relies on BUE support                            Cognition Arousal/Alertness: Awake/alert Behavior During Therapy: WFL for tasks assessed/performed Overall Cognitive Status: Within Functional Limits for tasks assessed                                        Exercises      General Comments General comments (skin integrity, edema, etc.): Pt reports ongoing dizziness. Supine BP- 150/92 HR- 100. Sitting 123/78, HR- 108. Standing BP not taken due to pt requesting to sit and immediate reports of dizziness. BP taken in sitting- 118/79, HR- 112. Pt encouraged to attempt marching in place, pt tolerates briefly  before needing to sit. BP taken at end of session 127/78.      Pertinent Vitals/Pain Pain Assessment: No/denies pain    Home Living                      Prior Function            PT Goals (current goals can now be found in the care plan section) Acute Rehab PT Goals Patient Stated Goal: get better and go home Progress towards PT goals: Not progressing toward goals - comment (limited due to orthostatic hypotension)     Frequency    Min 3X/week      PT Plan Current plan remains appropriate    Co-evaluation              AM-PAC PT "6 Clicks" Mobility   Outcome Measure  Help needed turning from your back to your side while in a flat bed without using bedrails?: A Little Help needed moving from lying on your back to sitting on the side of a flat bed without using bedrails?: A Little Help needed moving to and from a bed to a chair (including a wheelchair)?: A Little Help needed standing up from a chair using your arms (e.g., wheelchair or bedside chair)?: A Little Help needed to walk in hospital room?: A Lot Help needed climbing 3-5 steps with a railing? : A Lot 6 Click Score: 16    End of Session   Activity Tolerance: Treatment limited secondary to medical complications (Comment) (orthostatic hypotension) Patient left: with call bell/phone within reach;in chair;with family/visitor present Nurse Communication: Mobility status;Other (comment) (orthostatic) PT Visit Diagnosis: Other abnormalities of gait and mobility (R26.89);Muscle weakness (generalized) (M62.81);Difficulty in walking, not elsewhere classified (R26.2)     Time: 0109-3235 PT Time Calculation (min) (ACUTE ONLY): 41 min  Charges:  $Therapeutic Activity: 38-52 mins                     Acute Rehab  Pager: [phone]    Garwin Brothers, SPT  08/21/2020, 3:23 PM

## 2020-08-22 ENCOUNTER — Inpatient Hospital Stay (HOSPITAL_COMMUNITY): Payer: Medicare Other

## 2020-08-22 LAB — PROTIME-INR
INR: 1.3 — ABNORMAL HIGH (ref 0.8–1.2)
Prothrombin Time: 16.1 seconds — ABNORMAL HIGH (ref 11.4–15.2)

## 2020-08-22 MED ORDER — METOPROLOL SUCCINATE ER 25 MG PO TB24
12.5000 mg | ORAL_TABLET | Freq: Two times a day (BID) | ORAL | Status: DC
  Administered 2020-08-22 (×2): 12.5 mg via ORAL
  Filled 2020-08-22 (×2): qty 1

## 2020-08-22 NOTE — Progress Notes (Signed)
Progress Note  Patient Name: Katelyn [name] Date of Encounter: 08/22/2020  Primary Cardiologist: Shelva Majestic, MD   Subjective   Significant orthostasis, started metoprolol yesterday with improved rates.   Inpatient Medications    Scheduled Meds:  amiodarone  200 mg Oral Daily   bisacodyl  10 mg Oral Daily   estradiol  2 mg Oral Daily   metoprolol succinate  12.5 mg Oral BID   pantoprazole  40 mg Oral QAC supper   polyethylene glycol  17 g Oral Daily   rosuvastatin  20 mg Oral QHS   senna-docusate  1 tablet Oral QHS   thyroid  90 mg Oral Daily   warfarin  2.5 mg Oral q1600   Warfarin - Physician Dosing Inpatient   Does not apply q1600   Continuous Infusions:   PRN Meds: ondansetron (ZOFRAN) IV, oxyCODONE, traMADol   Vital Signs    Vitals:   08/21/20 1108 08/21/20 1957 08/22/20 0008 08/22/20 0417  BP: 111/64 135/77 123/80 130/63  Pulse: (!) 108 91 84 83  Resp: 19 19 14 16   Temp:  98.3 F (36.8 C) 98.4 F (36.9 C) 97.7 F (36.5 C)  TempSrc:  Oral Oral Oral  SpO2: 96% 97% 95% 95%  Weight:      Height:        Intake/Output Summary (Last 24 hours) at 08/22/2020 0839 Last data filed at 08/21/2020 2000 Gross per 24 hour  Intake --  Output 1220 ml  Net -1220 ml   Filed Weights   08/15/20 0802 08/16/20 0600  Weight: 79.8 kg 80.4 kg    Telemetry    Atrial fibrillation with some CVR, left bundle branch block- Personally Reviewed  ECG    ECG from 08/15/2020 reviewed, sinus bradycardia left bundle branch block- Personally Reviewed  Physical Exam   GEN: No acute distress.   Neck: No JVD Cardiac: iRRR, no murmurs, rubs, or gallops.  Respiratory: Clear to auscultation bilaterally. GI: Soft, nontender, non-distended  MS: No edema; No deformity. Neuro:  Nonfocal  Psych: Normal affect     Labs    Chemistry Recent Labs  Lab 08/17/20 0027 08/18/20 0101 08/20/20 0015 08/21/20 0042  NA 133* 136 136 133*  K 5.0 4.9 5.1 4.6  CL 101 105 105 102  CO2  26 26 27 23   GLUCOSE 121* 112* 109* 111*  BUN 31* 35* 28* 24*  CREATININE 2.00* 1.82* 1.40* 1.16*  CALCIUM 8.3* 8.4* 8.2* 8.5*  PROT 5.4*  --   --   --   ALBUMIN 2.7*  --   --   --   AST 26  --   --   --   ALT 11  --   --   --   ALKPHOS 54  --   --   --   BILITOT 0.7  --   --   --   GFRNONAA 25* 28* 39* 49*  ANIONGAP 6 5 4* 8     Hematology Recent Labs  Lab 08/16/20 0314 08/17/20 0027 08/20/20 0015  WBC 9.9 16.4* 8.3  RBC 3.58* 3.49* 3.66*  HGB 11.0* 10.7* 11.4*  HCT 34.3* 34.0* 35.7*  MCV 95.8 97.4 97.5  MCH 30.7 30.7 31.1  MCHC 32.1 31.5 31.9  RDW 14.3 14.7 14.6  PLT 121* 148* 150    Cardiac EnzymesNo results for input(s): TROPONINI in the last 168 hours. No results for input(s): TROPIPOC in the last 168 hours.   BNPNo results for input(s): BNP, PROBNP in the  last 168 hours.   DDimer No results for input(s): DDIMER in the last 168 hours.   Radiology    DG CHEST PORT 1 VIEW  Result Date: 08/22/2020 CLINICAL DATA:  Right pneumothorax. EXAM: PORTABLE CHEST 1 VIEW COMPARISON:  08/21/2020. FINDINGS: Left costophrenic angle incompletely imaged. Interim removal of large bore right chest tube. Small bore right chest tube in stable position. Persistent but slightly improved pneumothorax. Postsurgical changes right chest. No pleural effusion. Prior cardiac valve replacement. Heart size stable. Degenerative change thoracic spine. Mild right chest wall subcutaneous emphysema. IMPRESSION: Interim removal of large bore right chest tube. Small right chest tube in stable position. Persistent but slightly improved right-sided pneumothorax. Persistent mild right chest wall subcutaneous emphysema again noted. Electronically Signed   By: Marcello Moores  Register   On: 08/22/2020 08:29   DG CHEST PORT 1 VIEW  Result Date: 08/21/2020 CLINICAL DATA:  Chest tube.  Right pneumothorax. EXAM: PORTABLE CHEST 1 VIEW COMPARISON:  08/20/2020. FINDINGS: Right chest tubes in stable position. Postsurgical  changes right lung with persistent right pneumothorax. Pneumothorax may have increased slightly from prior exam. Left apical pleural thickening consistent scarring again noted. Prior cardiac valve replacement. Heart size stable no pulmonary venous congestion. Mild right chest wall subcutaneous emphysema again noted. IMPRESSION: 1. Two right chest tubes in stable position. Postsurgical changes right lung with persistent right pneumothorax. Pneumothorax may have increased slightly from prior exam. Mild right chest wall subcutaneous emphysema again noted. 2.  Prior cardiac valve replacement.  Heart size stable. Electronically Signed   By: Marcello Moores  Register   On: 08/21/2020 08:27    Cardiac Studies   Last echocardiogram 06/22/2019: 60 to 65% LVEF, moderate LVH, moderate LA dilation, stable TAVR prosthesis  Patient Profile     80 y.o. female with CAD (managed medically), aortic stenosis status post TAVR 08/2014, PVD s/p aortobifem bypass graft surgery and bilateral fundoplasty by [facility], carotid artery disease (13-08% RICA, 6-57% LICA, LECA >84% by duplex 07/2020), bilateral subclavian stenosis with antegrade flow by duplex 07/2020, renal artery stenosis (>60% L renal artery) by duplex 04/2020, HTN, HLD, paroxysmal atrial fibrillation, bradycardia, LBBB, first degree AVB, stroke.  Currently hospitalized and status post thoracoscopy and right upper lobe lobectomy for newly diagnosed right upper lobe lung cancer with subsequent sinus bradycardia and then atrial fibrillation.  Assessment & Plan   Active Problems:   S/P Robotic Assisted Right Video Thoracoscopy with Right Upper Lobectomy   Status post thoracotomy  Atrial fibrillation  Persistent atrial fibrillation - metoprolol has been resumed but patient is also having orthostasis. Will placed TED hose and discussed with pharmacist who recommends splitting BB dose, toprol xl 12.5 mg BID, will observe today and decrease dose to once daily if she remains  significantly orthostatic.        For questions or updates, please contact [place] Please consult [email or web address] for contact info under        Signed, Elouise Munroe, MD  08/22/2020, 8:39 AM

## 2020-08-22 NOTE — Progress Notes (Addendum)
      [address]       [place] [address]             [phone]       7 Days Post-Op Procedure(s) (LRB): XI ROBOTIC ASSISTED THORASCOPY-RIGHT UPPER LOBECTOMY (Right) INTERCOSTAL NERVE BLOCK RIGHT (Right) NODE DISSECTION (Right)  Subjective:  Patient states pain is better after removal of chest tube.  Continues to have difficulty with ambulation as when she stands she feels weak/dizzy.  Objective: Vital signs in last 24 hours: Temp:  [97.7 F (36.5 C)-98.4 F (36.9 C)] 97.7 F (36.5 C) (06/23 0417) Pulse Rate:  [83-121] 83 (06/23 0417) Cardiac Rhythm: Atrial fibrillation;Bundle branch block (06/23 0715) Resp:  [14-24] 16 (06/23 0417) BP: (106-135)/(54-80) 130/63 (06/23 0417) SpO2:  [95 %-97 %] 95 % (06/23 0417)  Intake/Output from previous day: 06/22 0701 - 06/23 0700 In: -  Out: 1310 [Urine:1100; Chest Tube:210]   General appearance: alert, cooperative, and no distress Heart: irregularly irregular rhythm Lungs: clear to auscultation bilaterally Abdomen: soft, non-tender; bowel sounds normal; no masses,  no organomegaly Extremities: extremities normal, atraumatic, no cyanosis or edema Wound: clean and dry  Lab Results: Recent Labs    08/20/20 0015  WBC 8.3  HGB 11.4*  HCT 35.7*  PLT 150   BMET:  Recent Labs    08/20/20 0015 08/21/20 0042  NA 136 133*  K 5.1 4.6  CL 105 102  CO2 27 23  GLUCOSE 109* 111*  BUN 28* 24*  CREATININE 1.40* 1.16*  CALCIUM 8.2* 8.5*    PT/INR:  Recent Labs    08/22/20 0031  LABPROT 16.1*  INR 1.3*   ABG    Component Value Date/Time   PHART 7.336 (L) 08/13/2020 1501   HCO3 22.2 08/13/2020 1501   TCO2 25 08/31/2014 1609   ACIDBASEDEF 2.7 (H) 08/13/2020 1501   O2SAT 94.3 08/13/2020 1501   CBG (last 3)  No results for input(s): GLUCAP in the last 72 hours.  Assessment/Plan: S/P Procedure(s) (LRB): XI ROBOTIC ASSISTED THORASCOPY-RIGHT UPPER LOBECTOMY (Right) INTERCOSTAL NERVE BLOCK RIGHT  (Right) NODE DISSECTION (Right)  CV- A. Fib, rate improved with addition of Toprol XL, continue Amiodarone... orthostasis with ambulation INR 1.3, will repeat 2.5 mg coumadin today, if no response will increase dose tomorrow Pulm- + air leak from pigtail on water seal, CXR with improvement of pneumothorax... output decreased to 175 cc output yesterday Deconditioning- patient having difficulty with ambulation due to orthostasis, continue PT/OT, home health arrangements made Dispo- patient stable, remains in A. Fib rate is improved with Toprol XL, orthostatic changes limiting ambulation, will add TED hose, continue PT/OT... patient cant go home until mobility improves   LOS: 7 days   Ellwood Handler, PA-C 08/22/2020  Patient seen and examined, agree with above Still has a small air leak, but improved and CXR shows better expansion of right lung- leave pigtail on water seal Orthostatic hypotension, limited mobility is primary issue  Remo Lipps C. Roxan Hockey, MD [facility] [phone]

## 2020-08-23 ENCOUNTER — Inpatient Hospital Stay (HOSPITAL_COMMUNITY): Payer: Medicare Other

## 2020-08-23 LAB — CBC
HCT: 38.3 % (ref 36.0–46.0)
Hemoglobin: 12.4 g/dL (ref 12.0–15.0)
MCH: 30.8 pg (ref 26.0–34.0)
MCHC: 32.4 g/dL (ref 30.0–36.0)
MCV: 95 fL (ref 80.0–100.0)
Platelets: 199 10*3/uL (ref 150–400)
RBC: 4.03 MIL/uL (ref 3.87–5.11)
RDW: 14.3 % (ref 11.5–15.5)
WBC: 9.9 10*3/uL (ref 4.0–10.5)
nRBC: 0 % (ref 0.0–0.2)

## 2020-08-23 LAB — BASIC METABOLIC PANEL
Anion gap: 9 (ref 5–15)
BUN: 24 mg/dL — ABNORMAL HIGH (ref 8–23)
CO2: 26 mmol/L (ref 22–32)
Calcium: 8.5 mg/dL — ABNORMAL LOW (ref 8.9–10.3)
Chloride: 99 mmol/L (ref 98–111)
Creatinine, Ser: 1.21 mg/dL — ABNORMAL HIGH (ref 0.44–1.00)
GFR, Estimated: 46 mL/min — ABNORMAL LOW (ref >60)
Glucose, Bld: 112 mg/dL — ABNORMAL HIGH (ref 70–99)
Potassium: 4.4 mmol/L (ref 3.5–5.1)
Sodium: 134 mmol/L — ABNORMAL LOW (ref 135–145)

## 2020-08-23 LAB — PROTIME-INR
INR: 1.5 — ABNORMAL HIGH (ref 0.8–1.2)
Prothrombin Time: 18.4 seconds — ABNORMAL HIGH (ref 11.4–15.2)

## 2020-08-23 MED ORDER — METOPROLOL TARTRATE 12.5 MG HALF TABLET
12.5000 mg | ORAL_TABLET | Freq: Two times a day (BID) | ORAL | Status: DC
  Administered 2020-08-23 – 2020-08-27 (×9): 12.5 mg via ORAL
  Filled 2020-08-23 (×10): qty 1

## 2020-08-23 NOTE — Progress Notes (Signed)
Physical Therapy Treatment Patient Details Name: Katelyn [name] MRN: [id number] DOB: September 10, 1940 Today's Date: 08/23/2020    History of Present Illness Pt is a 80 y/o female presenting for XI Robotic Assisted Thorascopy for right upper lobectomy 6/16. Pt with chest tube. PMH includes: heavy tobacco abuse, gold 2 COPD, severe aortic stenosis, TAVR, severe thoracic aortic atherosclerotic disease, coronary artery disease, peripheral arterial disease, stroke, atrial fibrillation, chronic anticoagulation, hypertension, hyperlipidemia, arthritis, and reflux.    PT Comments    Patient progressing some since last session able to take some steps, but limited by weakness and HR elevation to 124.  Working on sit to stand for LE strength and activity tolerance for most of session, then took steps back to bed.  Continue to feel she may need SNF unless family can provide 24 hour support.   Follow Up Recommendations  SNF;Supervision for mobility/OOB     Equipment Recommendations  Rolling walker with 5" wheels    Recommendations for Other Services       Precautions / Restrictions Precautions Precautions: Fall;Other (comment) Precaution Comments: chest tube, watch BP orthostatic    Mobility  Bed Mobility   Bed Mobility: Sit to Supine       Sit to supine: Supervision   General bed mobility comments: able to place feet on the bed, assist for tubes    Transfers Overall transfer level: Needs assistance Equipment used: Rolling walker (2 wheeled) Transfers: Sit to/from Stand Sit to Stand: Min assist         General transfer comment: min A initially to rise from EOB and from recliner, but improved to CGA on repetative efforts; performed x 4  Ambulation/Gait Ambulation/Gait assistance: Min assist Gait Distance (Feet): 6 Feet (x 2) Assistive device: Rolling walker (2 wheeled) Gait Pattern/deviations: Step-to pattern;Shuffle;Decreased stride length     General Gait Details: limited due  to HR up to 124 and SOB, SpO2 96% on RA   Stairs             Wheelchair Mobility    Modified Rankin (Stroke Patients Only)       Balance Overall balance assessment: Needs assistance Sitting-balance support: Feet supported Sitting balance-Leahy Scale: Good     Standing balance support: Bilateral upper extremity supported Standing balance-Leahy Scale: Poor Standing balance comment: reliant on UE support                            Cognition Arousal/Alertness: Awake/alert Behavior During Therapy: WFL for tasks assessed/performed Overall Cognitive Status: Within Functional Limits for tasks assessed                                        Exercises      General Comments General comments (skin integrity, edema, etc.): BP seated 126/84, standing 125/100; knee hi TEDS donned in sitting      Pertinent Vitals/Pain Pain Assessment: No/denies pain    Home Living                      Prior Function            PT Goals (current goals can now be found in the care plan section) Progress towards PT goals: Progressing toward goals (slowly)    Frequency    Min 3X/week      PT Plan Current plan remains appropriate  Co-evaluation              AM-PAC PT "6 Clicks" Mobility   Outcome Measure  Help needed turning from your back to your side while in a flat bed without using bedrails?: A Little Help needed moving from lying on your back to sitting on the side of a flat bed without using bedrails?: A Little Help needed moving to and from a bed to a chair (including a wheelchair)?: A Little Help needed standing up from a chair using your arms (e.g., wheelchair or bedside chair)?: A Little Help needed to walk in hospital room?: A Lot Help needed climbing 3-5 steps with a railing? : A Lot 6 Click Score: 16    End of Session   Activity Tolerance: Patient limited by fatigue Patient left: in bed;with call bell/phone within  reach   PT Visit Diagnosis: Other abnormalities of gait and mobility (R26.89);Muscle weakness (generalized) (M62.81);Difficulty in walking, not elsewhere classified (R26.2)     Time: 1240-1300 PT Time Calculation (min) (ACUTE ONLY): 20 min  Charges:  $Therapeutic Activity: 8-22 mins                     Magda Kiel, PT Acute Rehabilitation Services EBVPL:[phone] Office:[phone] 08/23/2020    Reginia Naas 08/23/2020, 2:01 PM

## 2020-08-23 NOTE — Care Management Important Message (Signed)
Important Message  Patient Details  Name: Katelyn [name] MRN: [id number] Date of Birth: 1940-04-19   Medicare Important Message Given:  Yes - Important Message mailed due to current [facility]     Orbie Pyo 08/23/2020, 2:55 PM

## 2020-08-23 NOTE — Care Management Important Message (Signed)
Important Message  Patient Details  Name: Katelyn [name] MRN: [id number] Date of Birth: [birth year]   Medicare Important Message Given:  Yes - Important Message mailed due to current National Emergency   Verbal consent obtained due to current National Emergency  Relationship to patient: Child Contact Name: [name] Call Date: 08/23/20  Time: 1000 Phone: [phone] Outcome: No Answer/Busy Important Message mailed to: Patient address on file    Delorse Lek 08/23/2020, 10:00 AM

## 2020-08-23 NOTE — Plan of Care (Signed)

## 2020-08-23 NOTE — Progress Notes (Addendum)
      [id number]       [place] [address]             [phone]      8 Days Post-Op Procedure(s) (LRB): XI ROBOTIC ASSISTED THORASCOPY-RIGHT UPPER LOBECTOMY (Right) INTERCOSTAL NERVE BLOCK RIGHT (Right) NODE DISSECTION (Right)  Subjective:  Patient has no new complaints.  She was not walked yesterday.  She did sit up on the side of the bed a few times and felt weak and dizzy.  She wore TED hose but developed cramping in her feet overnight and these were removed.  She will attempt to wear again today.  + BM  Objective: Vital signs in last 24 hours: Temp:  [97.7 F (36.5 C)-98.3 F (36.8 C)] 97.8 F (36.6 C) (06/24 0355) Pulse Rate:  [88-97] 88 (06/24 0355) Cardiac Rhythm: Normal sinus rhythm;Bundle branch block (06/23 1910) Resp:  [14-20] 20 (06/24 0355) BP: (111-133)/(65-85) 120/80 (06/24 0355) SpO2:  [94 %-97 %] 95 % (06/24 0355)  Intake/Output from previous day: 06/23 0701 - 06/24 0700 In: 720 [P.O.:720] Out: 1510 [Urine:1250; Chest Tube:260]   General appearance: alert, cooperative, and no distress Heart: irregularly irregular rhythm Lungs: clear to auscultation bilaterally Abdomen: soft, non-tender; bowel sounds normal; no masses,  no organomegaly Extremities: edema trace Wound: clean and dry  Lab Results: Recent Labs    08/23/20 0059  WBC 9.9  HGB 12.4  HCT 38.3  PLT 199   BMET:  Recent Labs    08/21/20 0042 08/23/20 0059  NA 133* 134*  K 4.6 4.4  CL 102 99  CO2 23 26  GLUCOSE 111* 112*  BUN 24* 24*  CREATININE 1.16* 1.21*  CALCIUM 8.5* 8.5*    PT/INR:  Recent Labs    08/23/20 0059  LABPROT 18.4*  INR 1.5*   ABG    Component Value Date/Time   PHART 7.336 (L) 08/13/2020 1501   HCO3 22.2 08/13/2020 1501   TCO2 25 08/31/2014 1609   ACIDBASEDEF 2.7 (H) 08/13/2020 1501   O2SAT 94.3 08/13/2020 1501   CBG (last 3)  No results for input(s): GLUCAP in the last 72 hours.  Assessment/Plan: S/P Procedure(s) (LRB): XI  ROBOTIC ASSISTED THORASCOPY-RIGHT UPPER LOBECTOMY (Right) INTERCOSTAL NERVE BLOCK RIGHT (Right) NODE DISSECTION (Right)  CV- A. Fib, rate controlled- remains on Amiodarone 200 mg daily, Lopressor 12.5 mg BID, if need be can decrease to once a day INR 1.5, finally starting to increase will continue home regimen of 2.5 as she is on Amiodarone Pulm- CXR remains stable with right pneumothorax, off oxygen, no air leak today on water seal, output 260 cc yesterday, leave in place today Deconditioning- limited mobility due to orthostasis, patient wasn't walked yesterday, continue PT/OT.Marland Kitchen patient does not want to go to SNF, home health arrangements have been made Dispo- patient stable, rate controlled A. Fib continue current medications for now can decrease Lopressor to daily dosing if orthostatic with ambulation, continue coumadin, no air leak from CT, CXR stable, output improving leave in place for now... really need to work on mobility, not ready for d/c     LOS: 8 days    Ellwood Handler, PA-C 08/23/2020 She did have a small leak when I examined her. CXR unchanged apical space Can be discharged with pigtail in place if we can get her mobility improved  Remo Lipps C. Roxan Hockey, MD [facility] [phone]

## 2020-08-23 NOTE — Progress Notes (Addendum)
Progress Note  Patient Name: Katelyn [name] Date of Encounter: 08/23/2020  Primary Cardiologist: Shelva Majestic, MD   Subjective   She was able to get up to the bedside commode and have a bowel movement without orthostatic symptoms or dizziness.  Splitting the dose of metoprolol seems to have helped.  Inpatient Medications    Scheduled Meds:  amiodarone  200 mg Oral Daily   bisacodyl  10 mg Oral Daily   estradiol  2 mg Oral Daily   metoprolol tartrate  12.5 mg Oral BID   pantoprazole  40 mg Oral QAC supper   polyethylene glycol  17 g Oral Daily   rosuvastatin  20 mg Oral QHS   senna-docusate  1 tablet Oral QHS   thyroid  90 mg Oral Daily   warfarin  2.5 mg Oral q1600   Warfarin - Physician Dosing Inpatient   Does not apply q1600   Continuous Infusions:   PRN Meds: ondansetron (ZOFRAN) IV, oxyCODONE, traMADol   Vital Signs    Vitals:   08/22/20 2300 08/23/20 0004 08/23/20 0355 08/23/20 0745  BP:  130/71 120/80 137/75  Pulse: 97 97 88 94  Resp: 20 14 20 18   Temp:  98.3 F (36.8 C) 97.8 F (36.6 C) 97.7 F (36.5 C)  TempSrc:  Oral Oral Oral  SpO2: 95% 97% 95% 94%  Weight:      Height:        Intake/Output Summary (Last 24 hours) at 08/23/2020 1113 Last data filed at 08/23/2020 0355 Gross per 24 hour  Intake 720 ml  Output 1510 ml  Net -790 ml   Filed Weights   08/15/20 0802 08/16/20 0600  Weight: 79.8 kg 80.4 kg    Telemetry    Atrial fibrillation with CVR, left bundle branch block- Personally Reviewed  ECG    ECG from 08/15/2020 reviewed, sinus bradycardia left bundle branch block- Personally Reviewed  Physical Exam   GEN: No acute distress.   Neck: No JVD Cardiac: iRRR, no murmurs, rubs, or gallops.  Respiratory: Clear to auscultation bilaterally. GI: Soft, nontender, non-distended  MS: No edema; No deformity. Neuro:  Nonfocal  Psych: Normal affect   Labs    Chemistry Recent Labs  Lab 08/17/20 0027 08/18/20 0101 08/20/20 0015  08/21/20 0042 08/23/20 0059  NA 133*   < > 136 133* 134*  K 5.0   < > 5.1 4.6 4.4  CL 101   < > 105 102 99  CO2 26   < > 27 23 26   GLUCOSE 121*   < > 109* 111* 112*  BUN 31*   < > 28* 24* 24*  CREATININE 2.00*   < > 1.40* 1.16* 1.21*  CALCIUM 8.3*   < > 8.2* 8.5* 8.5*  PROT 5.4*  --   --   --   --   ALBUMIN 2.7*  --   --   --   --   AST 26  --   --   --   --   ALT 11  --   --   --   --   ALKPHOS 54  --   --   --   --   BILITOT 0.7  --   --   --   --   GFRNONAA 25*   < > 39* 49* 46*  ANIONGAP 6   < > 4* 8 9   < > = values in this interval not displayed.     Hematology  Recent Labs  Lab 08/17/20 0027 08/20/20 0015 08/23/20 0059  WBC 16.4* 8.3 9.9  RBC 3.49* 3.66* 4.03  HGB 10.7* 11.4* 12.4  HCT 34.0* 35.7* 38.3  MCV 97.4 97.5 95.0  MCH 30.7 31.1 30.8  MCHC 31.5 31.9 32.4  RDW 14.7 14.6 14.3  PLT 148* 150 199    Cardiac EnzymesNo results for input(s): TROPONINI in the last 168 hours. No results for input(s): TROPIPOC in the last 168 hours.   BNPNo results for input(s): BNP, PROBNP in the last 168 hours.   DDimer No results for input(s): DDIMER in the last 168 hours.   Radiology    DG CHEST PORT 1 VIEW  Result Date: 08/23/2020 CLINICAL DATA:  Follow-up chest tube and right pneumothorax. EXAM: PORTABLE CHEST 1 VIEW COMPARISON:  08/22/2020 FINDINGS: Persistent right pneumothorax measuring 20-30% in size. Pneumothorax has not significantly changed. Stable appearance of the right pigtail chest tube. Again noted is a small amount of subcutaneous gas in the right chest. Evidence of a TAVR procedure. Few hazy densities at the left lung apex are minimally changed. Heart size is stable. There is linear density along the lateral left chest at likely represents a Mach line. IMPRESSION: Stable appearance and size of the right pneumothorax. Stable positioning of the right chest tube. Electronically Signed   By: Markus Daft M.D.   On: 08/23/2020 09:27   DG CHEST PORT 1 VIEW  Result  Date: 08/22/2020 CLINICAL DATA:  Right pneumothorax. EXAM: PORTABLE CHEST 1 VIEW COMPARISON:  08/21/2020. FINDINGS: Left costophrenic angle incompletely imaged. Interim removal of large bore right chest tube. Small bore right chest tube in stable position. Persistent but slightly improved pneumothorax. Postsurgical changes right chest. No pleural effusion. Prior cardiac valve replacement. Heart size stable. Degenerative change thoracic spine. Mild right chest wall subcutaneous emphysema. IMPRESSION: Interim removal of large bore right chest tube. Small right chest tube in stable position. Persistent but slightly improved right-sided pneumothorax. Persistent mild right chest wall subcutaneous emphysema again noted. Electronically Signed   By: Marcello Moores  Register   On: 08/22/2020 08:29    Cardiac Studies   Last echocardiogram 06/22/2019: 60 to 65% LVEF, moderate LVH, moderate LA dilation, stable TAVR prosthesis  Patient Profile     80 y.o. female with CAD (managed medically), aortic stenosis status post TAVR 08/2014, PVD s/p aortobifem bypass graft surgery and bilateral fundoplasty by VVS, carotid artery disease (62-26% RICA, 3-33% LICA, LECA >54% by duplex 07/2020), bilateral subclavian stenosis with antegrade flow by duplex 07/2020, renal artery stenosis (>60% L renal artery) by duplex 04/2020, HTN, HLD, paroxysmal atrial fibrillation, bradycardia, LBBB, first degree AVB, stroke.  Currently hospitalized and status post thoracoscopy and right upper lobe lobectomy for newly diagnosed right upper lobe lung cancer with subsequent sinus bradycardia and then atrial fibrillation.  Assessment & Plan   Active Problems:   S/P Robotic Assisted Right Video Thoracoscopy with Right Upper Lobectomy   Status post thoracotomy  Atrial fibrillation  Persistent atrial fibrillation -she remains in atrial fibrillation.  Warfarin has been resumed for anticoagulation.  Metoprolol succinate had been split 12.5 mg twice daily and  she tolerated this well.  This was converted today to metoprolol tartrate 12.5 twice daily.  Reasonable to dismiss home on this dosing if patient tolerates.  Continue amiodarone 200 mg daily.  Cardiology will follow as needed over the weekend.     For questions or updates, please contact [place] Please consult [email or web address] for contact info under  Signed, Elouise Munroe, MD  08/23/2020, 11:13 AM

## 2020-08-23 NOTE — Progress Notes (Signed)
Mobility Specialist: Progress Note   08/23/20 1618  Mobility  Activity Ambulated to bathroom  Level of Assistance Contact guard assist, steadying assist  Assistive Device Front wheel walker  Distance Ambulated (ft) 34 ft (20' then 14')  Mobility Ambulated with assistance in room  Mobility Response Tolerated well  Mobility performed by Mobility specialist  $Mobility charge 1 Mobility   Pre-Mobility: 83 HR, 97% SpO2 Post-Mobility: 99 HR, 136/90 BP, 95% SpO2  Pt requesting to go to BR upon entering room. Pt was min guard to sit EOB and standby assist to walk to the BR. Pt c/o feeling tired but otherwise asx. Pt back in bed with call bell at her side, VSS.   [facility] Estil Vallee Mobility Specialist Mobility Specialist Phone: [phone]

## 2020-08-24 ENCOUNTER — Inpatient Hospital Stay (HOSPITAL_COMMUNITY): Payer: Medicare Other

## 2020-08-24 DIAGNOSIS — R531 Weakness: Secondary | ICD-10-CM

## 2020-08-24 LAB — PROTIME-INR
INR: 1.8 — ABNORMAL HIGH (ref 0.8–1.2)
Prothrombin Time: 21 seconds — ABNORMAL HIGH (ref 11.4–15.2)

## 2020-08-24 NOTE — Progress Notes (Signed)
Occupational Therapy Treatment Patient Details Name: Katelyn [name] MRN: [id number] DOB: 1940-09-20 Today's Date: 08/24/2020    History of present illness Pt is a 80 y/o female presenting for XI Robotic Assisted Thorascopy for right upper lobectomy 6/16. Pt with chest tube. PMH includes: heavy tobacco abuse, gold 2 COPD, severe aortic stenosis, TAVR, severe thoracic aortic atherosclerotic disease, coronary artery disease, peripheral arterial disease, stroke, atrial fibrillation, chronic anticoagulation, hypertension, hyperlipidemia, arthritis, and reflux.   OT comments  Pt. Seen for skilled OT treatment session.  Completed bed mobility in/out with S and only required assistance for tube management.  Seated grooming task eob with education provided on energy conservation strategies.  Pt. Receptive to the strategies and actively utilized throughout.  Able to initiate rest breaks when needed and communicate her needs.  Will continue with stated acute goals.  Pt. Remains motivated and eager for participation.    Follow Up Recommendations  Home health OT;Supervision/Assistance - 24 hour    Equipment Recommendations  None recommended by OT    Recommendations for Other Services      Precautions / Restrictions Precautions Precautions: Fall;Other (comment) Precaution Comments: chest tube, watch BP orthostatic       Mobility Bed Mobility Overal bed mobility: Needs Assistance Bed Mobility: Sit to Supine     Supine to sit: Supervision     General bed mobility comments: able to place feet on the bed, assist for tubes    Transfers Overall transfer level: Needs assistance Equipment used: Rolling walker (2 wheeled) Transfers: Sit to/from Stand Sit to Stand: Min guard         General transfer comment: able to sit/stand from eob x1. declined ambulation secondary to fatigue.  able to side step x2 prior to sitting back down to ensure better positioning in bed prior to lying down     Balance                                           ADL either performed or assessed with clinical judgement   ADL Overall ADL's : Needs assistance/impaired     Grooming: Oral care;Set up;Sitting;Bed level Grooming Details (indicate cue type and reason): seated eob-energy conservation strategies provided                               General ADL Comments: pt limited by decreased activity tolerance and endurance.  session focused on seated grooming task with integration of energy conservation strategies.  initially agreeable to ambulate to sink but once in standing reported feeling too tired.  education provided on energy conservation strategies ie: sit vs stand if needed during adls.  initiating rest breaks.     Vision       Perception     Praxis      Cognition Arousal/Alertness: Awake/alert   Overall Cognitive Status: Within Functional Limits for tasks assessed                                          Exercises     Shoulder Instructions       General Comments      Pertinent Vitals/ Pain       Pain Assessment: No/denies pain  Home Living  Prior Functioning/Environment              Frequency  Min 2X/week        Progress Toward Goals  OT Goals(current goals can now be found in the care plan section)  Progress towards OT goals: Progressing toward goals     Plan Discharge plan remains appropriate    Co-evaluation                 AM-PAC OT "6 Clicks" Daily Activity     Outcome Measure   Help from another person eating meals?: None Help from another person taking care of personal grooming?: A Little Help from another person toileting, which includes using toliet, bedpan, or urinal?: A Little Help from another person bathing (including washing, rinsing, drying)?: A Lot Help from another person to put on and taking off regular upper body  clothing?: A Little Help from another person to put on and taking off regular lower body clothing?: A Lot 6 Click Score: 17    End of Session Equipment Utilized During Treatment: Rolling walker  OT Visit Diagnosis: Other abnormalities of gait and mobility (R26.89);Muscle weakness (generalized) (M62.81);Pain Pain - Right/Left: Right   Activity Tolerance Patient limited by fatigue   Patient Left in bed;with call bell/phone within reach   Nurse Communication Other (comment) (rn states prior to session pt. stable for participation)        Time: 3013-1438 OT Time Calculation (min): 20 min  Charges: OT General Charges $OT Visit: 1 Visit OT Treatments $Self Care/Home Management : 8-22 mins  Sonia Baller, COTA/L Acute Rehabilitation [phone]    Tanya Nones 08/24/2020, 10:27 AM

## 2020-08-24 NOTE — Plan of Care (Signed)

## 2020-08-24 NOTE — Progress Notes (Signed)
Progress Note  Patient Name: Katelyn [name] Date of Encounter: 08/24/2020  Primary Cardiologist: Shelva Majestic, MD   Subjective   "I feel weak when I walk."  Inpatient Medications    Scheduled Meds:  amiodarone  200 mg Oral Daily   bisacodyl  10 mg Oral Daily   estradiol  2 mg Oral Daily   metoprolol tartrate  12.5 mg Oral BID   pantoprazole  40 mg Oral QAC supper   polyethylene glycol  17 g Oral Daily   rosuvastatin  20 mg Oral QHS   senna-docusate  1 tablet Oral QHS   thyroid  90 mg Oral Daily   warfarin  2.5 mg Oral q1600   Warfarin - Physician Dosing Inpatient   Does not apply q1600   Continuous Infusions:  PRN Meds: ondansetron (ZOFRAN) IV, oxyCODONE, traMADol   Vital Signs    Vitals:   08/23/20 1209 08/23/20 1925 08/23/20 2342 08/24/20 0357  BP: (!) 145/77 123/86 109/68 106/61  Pulse: (!) 110 83 73 66  Resp:  17 15 16   Temp:  98 F (36.7 C) 97.6 F (36.4 C) 97.7 F (36.5 C)  TempSrc:  Oral Oral Oral  SpO2:  95% 94% 96%  Weight:      Height:        Intake/Output Summary (Last 24 hours) at 08/24/2020 8295 Last data filed at 08/23/2020 2342 Gross per 24 hour  Intake --  Output 90 ml  Net -90 ml   Filed Weights   08/15/20 0802 08/16/20 0600  Weight: 79.8 kg 80.4 kg    Telemetry    Atrial fib with a controlled VR - Personally Reviewed  ECG    none - Personally Reviewed  Physical Exam   GEN: No acute distress.   Neck: No JVD Cardiac: RRR, no murmurs, rubs, or gallops.  Respiratory: Clear to auscultation bilaterally. GI: Soft, nontender, non-distended  MS: No edema; No deformity. Neuro:  Nonfocal  Psych: Normal affect   Labs    Chemistry Recent Labs  Lab 08/20/20 0015 08/21/20 0042 08/23/20 0059  NA 136 133* 134*  K 5.1 4.6 4.4  CL 105 102 99  CO2 27 23 26   GLUCOSE 109* 111* 112*  BUN 28* 24* 24*  CREATININE 1.40* 1.16* 1.21*  CALCIUM 8.2* 8.5* 8.5*  GFRNONAA 39* 49* 46*  ANIONGAP 4* 8 9     Hematology Recent Labs   Lab 08/20/20 0015 08/23/20 0059  WBC 8.3 9.9  RBC 3.66* 4.03  HGB 11.4* 12.4  HCT 35.7* 38.3  MCV 97.5 95.0  MCH 31.1 30.8  MCHC 31.9 32.4  RDW 14.6 14.3  PLT 150 199    Cardiac EnzymesNo results for input(s): TROPONINI in the last 168 hours. No results for input(s): TROPIPOC in the last 168 hours.   BNPNo results for input(s): BNP, PROBNP in the last 168 hours.   DDimer No results for input(s): DDIMER in the last 168 hours.   Radiology    DG CHEST PORT 1 VIEW  Result Date: 08/24/2020 CLINICAL DATA:  9 Days Post-Op Procedure(s) (LRB):XI ROBOTIC ASSISTED THORASCOPY-RIGHT UPPER LOBECTOMY (Right)INTERCOSTAL NERVE BLOCK RIGHT (Right)NODE DISSECTION (Right) EXAM: PORTABLE CHEST 1 VIEW COMPARISON:  08/23/2020 and older studies. FINDINGS: Stable right sided pneumothorax, 20-30% in size. Right chest tube projecting over the right mid hemithorax is stable. No left pneumothorax. Prominent interstitial markings most evident at the right lung base. Additional hazy opacity at the right lung base is consistent with a small effusion. Cardiac silhouette  normal in size.  Stable aortic valve replacement. IMPRESSION: 1. Stable right-sided pneumothorax and chest tube when compared to the previous day's exam. 2. No acute abnormalities. Electronically Signed   By: Lajean Manes M.D.   On: 08/24/2020 08:23   DG CHEST PORT 1 VIEW  Result Date: 08/23/2020 CLINICAL DATA:  Follow-up chest tube and right pneumothorax. EXAM: PORTABLE CHEST 1 VIEW COMPARISON:  08/22/2020 FINDINGS: Persistent right pneumothorax measuring 20-30% in size. Pneumothorax has not significantly changed. Stable appearance of the right pigtail chest tube. Again noted is a small amount of subcutaneous gas in the right chest. Evidence of a TAVR procedure. Few hazy densities at the left lung apex are minimally changed. Heart size is stable. There is linear density along the lateral left chest at likely represents a Mach line. IMPRESSION: Stable  appearance and size of the right pneumothorax. Stable positioning of the right chest tube. Electronically Signed   By: Markus Daft M.D.   On: 08/23/2020 09:27    Cardiac Studies   none  Patient Profile     80 y.o. female admitted for lung resection.   Assessment & Plan    Persistent atrial fib - her rates are well controlled. She is encouraged to increase activity. Continue beta blocker dose. Weakness - she is slowly improving after surgery. Continue cardiac rehab/PT.     For questions or updates, please contact [place] Please consult [email or web address] for contact info under Cardiology/STEMI.      Signed, Cristopher Peru, MD  08/24/2020, 9:21 AM   Patient ID: Katelyn [name], female   DOB: 03/20/1940, 80 y.o.   MRN: [id number]

## 2020-08-24 NOTE — Progress Notes (Addendum)
[address]       ,[place] [address]             [phone]      8 Days Post-Op Procedure(s) (LRB): XI ROBOTIC ASSISTED THORASCOPY-RIGHT UPPER LOBECTOMY (Right) INTERCOSTAL NERVE BLOCK RIGHT (Right) NODE DISSECTION (Right)  Subjective:  Patient has no new complaints.  Says pain is much improved since removal of the chest tube. Still fatigues with minimal activity. Walked only 59' with the mobility tech yesterday.   Objective: Vital signs in last 24 hours: Temp:  [97.6 F (36.4 C)-98 F (36.7 C)] 97.7 F (36.5 C) (06/25 0357) Pulse Rate:  [66-83] 74 (06/25 1041) Cardiac Rhythm: Atrial fibrillation (06/25 0756) Resp:  [15-17] 16 (06/25 0357) BP: (106-123)/(56-86) 117/56 (06/25 1041) SpO2:  [94 %-96 %] 96 % (06/25 0357)  Intake/Output from previous day: 06/24 0701 - 06/25 0700 In: -  Out: 90 [Chest Tube:90]   General appearance: alert, cooperative, and no distress Heart: irregularly irregular rhythm Lungs: clear to auscultation bilaterally (chronic a-fib with left BBB Abdomen: soft, non-tender Wounds: clean and dry  Lab Results: Recent Labs    08/23/20 0059  WBC 9.9  HGB 12.4  HCT 38.3  PLT 199    BMET:  Recent Labs    08/23/20 0059  NA 134*  K 4.4  CL 99  CO2 26  GLUCOSE 112*  BUN 24*  CREATININE 1.21*  CALCIUM 8.5*     PT/INR:  Recent Labs    08/24/20 0036  LABPROT 21.0*  INR 1.8*    ABG    Component Value Date/Time   PHART 7.336 (L) 08/13/2020 1501   HCO3 22.2 08/13/2020 1501   TCO2 25 08/31/2014 1609   ACIDBASEDEF 2.7 (H) 08/13/2020 1501   O2SAT 94.3 08/13/2020 1501   CBG (last 3)  No results for input(s): GLUCAP in the last 72 hours.  EXAM: PORTABLE CHEST 1 VIEW   COMPARISON:  08/23/2020 and older studies.   FINDINGS: Stable right sided pneumothorax, 20-30% in size. Right chest tube projecting over the right mid hemithorax is stable.   No left pneumothorax.   Prominent interstitial markings most  evident at the right lung base. Additional hazy opacity at the right lung base is consistent with a small effusion.   Cardiac silhouette normal in size.  Stable aortic valve replacement.   IMPRESSION: 1. Stable right-sided pneumothorax and chest tube when compared to the previous day's exam. 2. No acute abnormalities.     Electronically Signed   By: Lajean Manes M.D.   On: 08/24/2020 08:23  Assessment/Plan: S/P Procedure(s) (LRB): XI ROBOTIC ASSISTED THORASCOPY-RIGHT UPPER LOBECTOMY (Right) INTERCOSTAL NERVE BLOCK RIGHT (Right) NODE DISSECTION (Right)    CV- A. Fib, rate controlled- remains on Amiodarone 200 mg daily, Lopressor 12.5 mg BID. BP OK. INR 1.8,  will continue Coumadin at 2.5mg /day as she is on Amiodarone Pulm- CT removed yesterday.  CXR showing stable right PTX. A right pleural pigtail catheter remains in place. No air leak and minimal drainage.  Deconditioning- limited mobility due to orthostasis and severe fatigue, continue PT/OT. Patient does not want to go to SNF, home health arrangements have been made Dispo- patient stable, rate controlled A. Fib continue current medications for now. Continue coumadin.  Continue working on mobility.    LOS: 9 days    Malon Kindle [phone] 08/24/2020   Chart reviewed, patient examined, agree with above. CXR shows stable right ptx that is probably 20%.  There is a 3+ air leak with cough when I was observing it. Keep chest tube in.

## 2020-08-24 NOTE — Progress Notes (Signed)
Mobility Specialist: Progress Note   08/24/20 1444  Mobility  Activity Ambulated in room  Level of Assistance Standby assist, set-up cues, supervision of patient - no hands on  Assistive Device Front wheel walker  Distance Ambulated (ft) 132 ft (44'x3)  Mobility Ambulated with assistance in room  Mobility Response Tolerated well  Mobility performed by Mobility specialist  $Mobility charge 1 Mobility   Pre-Mobility: 83 HR, 101/65 BP, 96% SpO2 During Mobility: 96 HR, 96% SpO2 Post-Mobility: 80 HR, 159/73 BP, 94% SpO2  Pt did much better today and was able to ambulate three bouts. Pt c/o feeling fatigued at the end of each bout but no specific c/o. Pt back to bed after walk with call bell at her side.   [facility] Katelyn [name] Mobility Specialist Mobility Specialist Phone: [phone]

## 2020-08-25 LAB — PROTIME-INR
INR: 2 — ABNORMAL HIGH (ref 0.8–1.2)
Prothrombin Time: 22.6 seconds — ABNORMAL HIGH (ref 11.4–15.2)

## 2020-08-25 NOTE — Progress Notes (Addendum)
[address]       [place] [address]             [phone]      8 Days Post-Op Procedure(s) (LRB): XI ROBOTIC ASSISTED THORASCOPY-RIGHT UPPER LOBECTOMY (Right) INTERCOSTAL NERVE BLOCK RIGHT (Right) NODE DISSECTION (Right)  Subjective:  Sitting up eating lunch, says she is feeling stronger. She walked in the hall today.   Vital signs in last 24 hours: Temp:  [97.6 F (36.4 C)-98.7 F (37.1 C)] 98 F (36.7 C) (06/26 1112) Pulse Rate:  [54-91] 74 (06/26 1112) Cardiac Rhythm: Atrial fibrillation (06/26 0733) Resp:  [12-20] 18 (06/26 1112) BP: (107-157)/(58-93) 157/89 (06/26 1112) SpO2:  [95 %-98 %] 97 % (06/26 1112)  Intake/Output from previous day: 06/25 0701 - 06/26 0700 In: -  Out: 140 [Chest Tube:140]   General appearance: alert, cooperative, and no distress Heart: irregularly irregular rhythm  (chronic a-fib with left BBB) Lungs: clear to auscultation bilaterally  Abdomen: soft, non-tender Wounds: clean and dry  Lab Results: Recent Labs    08/23/20 0059  WBC 9.9  HGB 12.4  HCT 38.3  PLT 199    BMET:  Recent Labs    08/23/20 0059  NA 134*  K 4.4  CL 99  CO2 26  GLUCOSE 112*  BUN 24*  CREATININE 1.21*  CALCIUM 8.5*     PT/INR:  Recent Labs    08/25/20 0104  LABPROT 22.6*  INR 2.0*    ABG    Component Value Date/Time   PHART 7.336 (L) 08/13/2020 1501   HCO3 22.2 08/13/2020 1501   TCO2 25 08/31/2014 1609   ACIDBASEDEF 2.7 (H) 08/13/2020 1501   O2SAT 94.3 08/13/2020 1501   CBG (last 3)  No results for input(s): GLUCAP in the last 72 hours.  EXAM: PORTABLE CHEST 1 VIEW   COMPARISON:  08/23/2020 and older studies.   FINDINGS: Stable right sided pneumothorax, 20-30% in size. Right chest tube projecting over the right mid hemithorax is stable.   No left pneumothorax.   Prominent interstitial markings most evident at the right lung base. Additional hazy opacity at the right lung base is consistent with  a small effusion.   Cardiac silhouette normal in size.  Stable aortic valve replacement.   IMPRESSION: 1. Stable right-sided pneumothorax and chest tube when compared to the previous day's exam. 2. No acute abnormalities.     Electronically Signed   By: Lajean Manes M.D.   On: 08/24/2020 08:23  Assessment/Plan: S/P Procedure(s) (LRB): XI ROBOTIC ASSISTED THORASCOPY-RIGHT UPPER LOBECTOMY (Right) INTERCOSTAL NERVE BLOCK RIGHT (Right) NODE DISSECTION (Right)    CV- A. Fib, rate controlled, this is chronic- remains on Amiodarone 200 mg daily, Lopressor 12.5 mg BID. BP OK. INR 12.0,  will continue Coumadin at 2.5mg /day  Pulm- CT removed 6/24, has right pigtail catheter. The air leak is smaller.  CXR showing stable right PTX.  Deconditioning- she has had limited mobility due to orthostasis and severe fatigue but walked >133ft this am. Will continue PT/OT. Patient does not want to go to SNF, home health arrangements have been made.  The CIR admissions coordinator requested consult for CIR today- will order.  Dispo- patient stable, rate controlled A. Fib continue current medications for now. Continue coumadin.     LOS: 10 days    Malon Kindle [phone] 08/25/2020    Chart reviewed, patient examined, agree with above. She still has 1 chamber air leak with coughing this  am but much smaller than yesterday. She feels well and is walking and eating. Continue tube to water seal and repeat CXR in am.

## 2020-08-25 NOTE — Progress Notes (Addendum)
Mobility Specialist: Progress Note   08/25/20 1052  Mobility  Activity Ambulated in hall  Level of Assistance Modified independent, requires aide device or extra time  Assistive Device Four wheel walker  Distance Ambulated (ft) 340 ft  Mobility Ambulated with assistance in hallway  Mobility Response Tolerated well  Mobility performed by Mobility specialist  $Mobility charge 1 Mobility   Pre-Mobility: 66 HR, 111/67 BP, 97% SpO2 During Mobility: 98 HR Post-Mobility: 84 HR, 157/89 BP, 96% SpO2  Pt was able to ambulate in the hallway using the rollator today stopping to take four brief standing breaks throughout. Pt visibly SOB but had no c/o. Encouraged pt to walk 1-2 more times today. Pt is requesting rollator for home.   [facility] Jeannette Maddy Mobility Specialist Mobility Specialist Phone: [phone]

## 2020-08-25 NOTE — Plan of Care (Signed)

## 2020-08-25 NOTE — Progress Notes (Addendum)
Inpatient Rehab Admissions Coordinator Note:   Pt was screened for CIR candidacy by Clemens Catholic, [place] CCC-SLP. At this time, Pt. Appears to have functional decline and is a potential  candidate for CIR. Will request  order for rehab consult per protocol.  Please contact me with questions.   Clemens Catholic, [place] [place] Admissions Coordinator  [phone] ([place]) [phone] (office)

## 2020-08-26 ENCOUNTER — Inpatient Hospital Stay (HOSPITAL_COMMUNITY): Payer: Medicare Other

## 2020-08-26 DIAGNOSIS — I6523 Occlusion and stenosis of bilateral carotid arteries: Secondary | ICD-10-CM

## 2020-08-26 DIAGNOSIS — E7800 Pure hypercholesterolemia, unspecified: Secondary | ICD-10-CM

## 2020-08-26 DIAGNOSIS — Z953 Presence of xenogenic heart valve: Secondary | ICD-10-CM

## 2020-08-26 LAB — BASIC METABOLIC PANEL
Anion gap: 8 (ref 5–15)
BUN: 19 mg/dL (ref 8–23)
CO2: 26 mmol/L (ref 22–32)
Calcium: 8.3 mg/dL — ABNORMAL LOW (ref 8.9–10.3)
Chloride: 101 mmol/L (ref 98–111)
Creatinine, Ser: 1.29 mg/dL — ABNORMAL HIGH (ref 0.44–1.00)
GFR, Estimated: 43 mL/min — ABNORMAL LOW (ref >60)
Glucose, Bld: 115 mg/dL — ABNORMAL HIGH (ref 70–99)
Potassium: 3.9 mmol/L (ref 3.5–5.1)
Sodium: 135 mmol/L (ref 135–145)

## 2020-08-26 LAB — CBC
HCT: 37.1 % (ref 36.0–46.0)
Hemoglobin: 12.3 g/dL (ref 12.0–15.0)
MCH: 31.5 pg (ref 26.0–34.0)
MCHC: 33.2 g/dL (ref 30.0–36.0)
MCV: 95.1 fL (ref 80.0–100.0)
Platelets: 191 10*3/uL (ref 150–400)
RBC: 3.9 MIL/uL (ref 3.87–5.11)
RDW: 14.6 % (ref 11.5–15.5)
WBC: 11.9 10*3/uL — ABNORMAL HIGH (ref 4.0–10.5)
nRBC: 0 % (ref 0.0–0.2)

## 2020-08-26 LAB — PROTIME-INR
INR: 2.2 — ABNORMAL HIGH (ref 0.8–1.2)
Prothrombin Time: 24.8 seconds — ABNORMAL HIGH (ref 11.4–15.2)

## 2020-08-26 NOTE — Progress Notes (Signed)
Inpatient Rehab Admissions Coordinator:   Consult received and chart reviewed.  Note pt preferring to discharge home and seems to be doing well enough with family support (at least for now) from her daughter.  Will sign off for CIR.  Please feel free to re-consult if needs change.    Shann Medal, PT, DPT Admissions Coordinator [phone] 08/26/20  10:31 AM

## 2020-08-26 NOTE — Progress Notes (Signed)
Progress Note  Patient Name: Katelyn [name] Date of Encounter: 08/26/2020  [facility] Cardiologist: Shelva Majestic, MD   Subjective   Feeling well.  Ambulated yesterday and she got tired but was otherwise well.  Inpatient Medications    Scheduled Meds:  amiodarone  200 mg Oral Daily   bisacodyl  10 mg Oral Daily   estradiol  2 mg Oral Daily   metoprolol tartrate  12.5 mg Oral BID   pantoprazole  40 mg Oral QAC supper   polyethylene glycol  17 g Oral Daily   rosuvastatin  20 mg Oral QHS   senna-docusate  1 tablet Oral QHS   thyroid  90 mg Oral Daily   warfarin  2.5 mg Oral q1600   Warfarin - Physician Dosing Inpatient   Does not apply q1600   Continuous Infusions:  PRN Meds: ondansetron (ZOFRAN) IV, oxyCODONE, traMADol   Vital Signs    Vitals:   08/25/20 1941 08/25/20 2121 08/25/20 2349 08/26/20 0405  BP: (!) 149/81  125/85 (!) 102/58  Pulse: 92 79 66 65  Resp: 18 20 15 14   Temp: 97.9 F (36.6 C)  97.7 F (36.5 C) 98 F (36.7 C)  TempSrc: Oral  Oral Oral  SpO2: 95% 95% 92% 93%  Weight:      Height:        Intake/Output Summary (Last 24 hours) at 08/26/2020 0901 Last data filed at 08/26/2020 0705 Gross per 24 hour  Intake --  Output 100 ml  Net -100 ml   Last 3 Weights 08/16/2020 08/15/2020 08/13/2020  Weight (lbs) 177 lb 4 oz 176 lb 176 lb 8 oz  Weight (kg) 80.4 kg 79.833 kg 80.06 kg      Telemetry    Atrial fibrillation.  Rates less than 100 bpm.- Personally Reviewed  ECG    N/a - Personally Reviewed  Physical Exam   VS:  BP (!) 102/58 (BP Location: Right Arm)   Pulse 65   Temp 98 F (36.7 C) (Oral)   Resp 14   Ht 5\' 10"  (1.778 m)   Wt 80.4 kg   SpO2 93%   BMI 25.43 kg/m  , BMI Body mass index is 25.43 kg/m. GENERAL:  Well appearing HEENT: Pupils equal round and reactive, fundi not visualized, oral mucosa unremarkable NECK:  No jugular venous distention, waveform within normal limits, carotid upstroke brisk and symmetric, no  bruits LUNGS:  Clear to auscultation bilaterally HEART:  RRR.  PMI not displaced or sustained,S1 and S2 within normal limits, no S3, no S4, no clicks, no rubs, no murmurs ABD:  Flat, positive bowel sounds normal in frequency in pitch, no bruits, no rebound, no guarding, no midline pulsatile mass, no hepatomegaly, no splenomegaly EXT:  2 plus pulses throughout, no edema, no cyanosis no clubbing SKIN:  No rashes no nodules NEURO:  Cranial nerves II through XII grossly intact, motor grossly intact throughout PSYCH:  Cognitively intact, oriented to person place and time   Labs    High Sensitivity Troponin:  No results for input(s): TROPONINIHS in the last 720 hours.    Chemistry Recent Labs  Lab 08/21/20 0042 08/23/20 0059 08/26/20 0048  NA 133* 134* 135  K 4.6 4.4 3.9  CL 102 99 101  CO2 23 26 26   GLUCOSE 111* 112* 115*  BUN 24* 24* 19  CREATININE 1.16* 1.21* 1.29*  CALCIUM 8.5* 8.5* 8.3*  GFRNONAA 49* 46* 43*  ANIONGAP 8 9 8      Hematology Recent  Labs  Lab 08/20/20 0015 08/23/20 0059 08/26/20 0048  WBC 8.3 9.9 11.9*  RBC 3.66* 4.03 3.90  HGB 11.4* 12.4 12.3  HCT 35.7* 38.3 37.1  MCV 97.5 95.0 95.1  MCH 31.1 30.8 31.5  MCHC 31.9 32.4 33.2  RDW 14.6 14.3 14.6  PLT 150 199 191    BNPNo results for input(s): BNP, PROBNP in the last 168 hours.   DDimer No results for input(s): DDIMER in the last 168 hours.   Radiology    DG CHEST PORT 1 VIEW  Result Date: 08/26/2020 CLINICAL DATA:  Chest tube.  Pneumothorax. EXAM: PORTABLE CHEST 1 VIEW COMPARISON:  08/24/2020 FINDINGS: Patient has RIGHT-sided chest tube, unchanged in position. RIGHT apical pneumothorax is slightly smaller, approximately 10-15% of lung volume. There is minimal atelectasis at the RIGHT lung base. LEFT lung is clear. Heart size is normal. There is atherosclerotic calcification of the thoracic aorta. The IMPRESSION: Smaller RIGHT apical pneumothorax. Electronically Signed   By: Nolon Nations M.D.    On: 08/26/2020 07:58    Cardiac Studies   Echo 06/22/19: 1. Left ventricular ejection fraction, by estimation, is 60 to 65%. The  left ventricle has normal function. The left ventricle has no regional  wall motion abnormalities. There is moderate left ventricular hypertrophy.  Left ventricular diastolic  parameters are indeterminate.   2. Right ventricular systolic function is normal. The right ventricular  size is normal. There is moderately elevated pulmonary artery systolic  pressure.   3. Left atrial size was moderately dilated.   4. The mitral valve is normal in structure. Mild mitral valve  regurgitation. No evidence of mitral stenosis.   5. Post TAVR with 26 mm Sapien 3 valve stable gradients since 05/19/18 and  no PVL. The aortic valve has been repaired/replaced. Aortic valve  regurgitation is not visualized. No aortic stenosis is present.   6. The inferior vena cava is normal in size with greater than 50%  respiratory variability, suggesting right atrial pressure of 3 mmHg.   Patient Profile     80 y.o. female with medically managed CAD, aortic stenosis status post TAVR, persistent atrial fibrillation and carotid stenosis (55-73% RICA, 2-20% LICA, LECA >25% by duplex 07/2020), bilateral subclavian stenosis, left bundle branch block, and stroke admitted for robotic lung resection.  Assessment & Plan    #Persistent atrial fibrillation:  Rates are well-controlled on metoprolol.  She is asymptomatic.  INR is within range.  Continue amiodarone, metoprolol, and warfarin.  She has not tolerated higher doses of beta-blocker due to dizziness.  Therefore she has been on amiodarone chronically for rate control, though she remains in atrial fibrillation.  #Carotid stenosis: #Hyperlipidemia: Carotid disease last assessed 07/2020.  Continue rosuvastatin.  LDL was 57 on 12/2019.  She is not on aspirin given that she is on warfarin.     [facility] will sign off.   Medication  Recommendations: No changes Other recommendations (labs, testing, etc): None Follow up as an outpatient: As scheduled  For questions or updates, please contact [facility] Please consult [email or web address] for contact info under        Signed, Skeet Latch, MD  08/26/2020, 9:01 AM

## 2020-08-26 NOTE — Progress Notes (Signed)
      [address]       [place] [address]             [phone]      11 Days Post-Op Procedure(s) (LRB): XI ROBOTIC ASSISTED THORASCOPY-RIGHT UPPER LOBECTOMY (Right) INTERCOSTAL NERVE BLOCK RIGHT (Right) NODE DISSECTION (Right)  Subjective:  Patient is feeling better.  She has been able to get up an ambulate.  She is not interested in CIR or SNF.  She wishes to go home with home PT, which has been arranged.  Objective: Vital signs in last 24 hours: Temp:  [97.7 F (36.5 C)-98 F (36.7 C)] 98 F (36.7 C) (06/27 0405) Pulse Rate:  [64-92] 65 (06/27 0405) Cardiac Rhythm: Atrial fibrillation;Bundle branch block (06/27 0713) Resp:  [14-20] 14 (06/27 0405) BP: (102-157)/(58-89) 102/58 (06/27 0405) SpO2:  [92 %-97 %] 93 % (06/27 0405)  Intake/Output from previous day: 06/26 0701 - 06/27 0700 In: -  Out: 180 [Chest Tube:180] Intake/Output this shift: Total I/O In: -  Out: 10 [Chest Tube:10]  General appearance: alert, cooperative, and no distress Heart: irregularly irregular rhythm Lungs: clear to auscultation bilaterally Abdomen: soft, non-tender; bowel sounds normal; no masses,  no organomegaly Wound: clean and dry  Lab Results: Recent Labs    08/26/20 0048  WBC 11.9*  HGB 12.3  HCT 37.1  PLT 191   BMET:  Recent Labs    08/26/20 0048  NA 135  K 3.9  CL 101  CO2 26  GLUCOSE 115*  BUN 19  CREATININE 1.29*  CALCIUM 8.3*    PT/INR:  Recent Labs    08/26/20 0048  LABPROT 24.8*  INR 2.2*   ABG    Component Value Date/Time   PHART 7.336 (L) 08/13/2020 1501   HCO3 22.2 08/13/2020 1501   TCO2 25 08/31/2014 1609   ACIDBASEDEF 2.7 (H) 08/13/2020 1501   O2SAT 94.3 08/13/2020 1501   CBG (last 3)  No results for input(s): GLUCAP in the last 72 hours.  Assessment/Plan: S/P Procedure(s) (LRB): XI ROBOTIC ASSISTED THORASCOPY-RIGHT UPPER LOBECTOMY (Right) INTERCOSTAL NERVE BLOCK RIGHT (Right) NODE DISSECTION (Right)  CV- A. Fib  rate controlled, chronic-remains on Amiodarone, Lopressor 12.5 mg BID INR 2.2, continue coumadin at 2.5 mg daily Pulm- CT in place + air leak, CXR with improvement of pneumothorax... will convert chest tube to [facility] Deconditioning- continue PT,OT, home health arrangements in place Dispo- patient stable, air leak persists, CXR improved... CT to [facility], continue PT/OT, if patient's CXR remains stable may be able to d/c home tomorrow   LOS: 11 days    Ellwood Handler, PA-C 08/26/2020

## 2020-08-26 NOTE — Progress Notes (Signed)
Physical Therapy Treatment Patient Details Name: Katelyn [name] MRN: [id number] DOB: 1940-05-15 Today's Date: 08/26/2020    History of Present Illness Pt is a 80 y/o female presenting for XI Robotic Assisted Thorascopy for right upper lobectomy 6/16. Pt with chest tube - now on mini -express. PMH includes: heavy tobacco abuse, gold 2 COPD, severe aortic stenosis, TAVR, severe thoracic aortic atherosclerotic disease, coronary artery disease, peripheral arterial disease, stroke, atrial fibrillation, chronic anticoagulation, hypertension, hyperlipidemia, arthritis, and reflux.    PT Comments    Pt making good progress today.  Her VSS with no episodes of orthostatic hypotension.  She was able to ambulate 400' with rollator and standing rest breaks. Pt wishes to return home at d/c and per eval her daughter will stay with her short term.  Pt demonstrates mobility necessary to do this.  Updated recommendations and equipment recommendations to rollator.     Follow Up Recommendations  Home health PT;Supervision - Intermittent     Equipment Recommendations  Other (comment) (rollator)    Recommendations for Other Services       Precautions / Restrictions Precautions Precautions: Fall;Other (comment) Precaution Comments: Chest tube - mini express    Mobility  Bed Mobility Overal bed mobility: Needs Assistance Bed Mobility: Supine to Sit;Sit to Supine     Supine to sit: Supervision Sit to supine: Supervision        Transfers Overall transfer level: Needs assistance Equipment used: 4-wheeled walker Transfers: Sit to/from Stand Sit to Stand: Supervision         General transfer comment: pt with good use of rollator brakes with initial verbal cues  Ambulation/Gait Ambulation/Gait assistance: Min guard Gait Distance (Feet): 400 Feet (with 3 standing rest breaks) Assistive device: 4-wheeled walker Gait Pattern/deviations: Step-through pattern;Decreased stride length Gait  velocity: reduced   General Gait Details: Pt self corrected posture (trunk lean); initially cued for rollator brakes but then able to use as appropriate; took 3 standing rest brakes - discussed seated rest breaks on rollator but pt preferred to stand. Educated on "parking' rollator on wall and locking brakes if wanting to sit   Stairs             Wheelchair Mobility    Modified Rankin (Stroke Patients Only)       Balance Overall balance assessment: Needs assistance Sitting-balance support: Feet supported Sitting balance-Leahy Scale: Good     Standing balance support: No upper extremity supported;Bilateral upper extremity supported Standing balance-Leahy Scale: Fair Standing balance comment: rollator to ambulate; no AD to stand or transfer to chair                            Cognition Arousal/Alertness: Awake/alert Behavior During Therapy: WFL for tasks assessed/performed Overall Cognitive Status: Within Functional Limits for tasks assessed                                        Exercises      General Comments General comments (skin integrity, edema, etc.): BP stable; O2 sats 96% on RA; HR 122 max with ambulation      Pertinent Vitals/Pain Pain Assessment: Faces Faces Pain Scale: Hurts little more Pain Location: R chest with coughing Pain Descriptors / Indicators: Discomfort Pain Intervention(s): Limited activity within patient's tolerance;Monitored during session    Home Living  Prior Function            PT Goals (current goals can now be found in the care plan section) Acute Rehab PT Goals Patient Stated Goal: get better and go home PT Goal Formulation: With patient Time For Goal Achievement: 08/31/20 Potential to Achieve Goals: Good Progress towards PT goals: Progressing toward goals    Frequency    Min 3X/week      PT Plan Discharge plan needs to be updated;Equipment recommendations  need to be updated    Co-evaluation              AM-PAC PT "6 Clicks" Mobility   Outcome Measure  Help needed turning from your back to your side while in a flat bed without using bedrails?: A Little Help needed moving from lying on your back to sitting on the side of a flat bed without using bedrails?: A Little Help needed moving to and from a bed to a chair (including a wheelchair)?: A Little Help needed standing up from a chair using your arms (e.g., wheelchair or bedside chair)?: A Little Help needed to walk in hospital room?: A Little Help needed climbing 3-5 steps with a railing? : A Little 6 Click Score: 18    End of Session Equipment Utilized During Treatment: Gait belt Activity Tolerance: Patient tolerated treatment well Patient left: with call bell/phone within reach;in chair Nurse Communication: Mobility status PT Visit Diagnosis: Other abnormalities of gait and mobility (R26.89);Muscle weakness (generalized) (M62.81);Difficulty in walking, not elsewhere classified (R26.2)     Time: 1130-1150 PT Time Calculation (min) (ACUTE ONLY): 20 min  Charges:  $Gait Training: 8-22 mins                     Abran Richard, PT Acute Rehab Services Pager [phone] [facility] [place] 08/26/2020, 12:03 PM

## 2020-08-27 ENCOUNTER — Inpatient Hospital Stay (HOSPITAL_COMMUNITY): Payer: Medicare Other

## 2020-08-27 ENCOUNTER — Other Ambulatory Visit (HOSPITAL_COMMUNITY): Payer: Self-pay

## 2020-08-27 LAB — PROTIME-INR
INR: 2.2 — ABNORMAL HIGH (ref 0.8–1.2)
Prothrombin Time: 24.7 seconds — ABNORMAL HIGH (ref 11.4–15.2)

## 2020-08-27 MED ORDER — METOPROLOL TARTRATE 25 MG PO TABS
12.5000 mg | ORAL_TABLET | Freq: Two times a day (BID) | ORAL | 3 refills | Status: DC
  Filled 2020-08-27: qty 60, 60d supply, fill #0

## 2020-08-27 MED ORDER — OXYCODONE HCL 5 MG PO TABS
5.0000 mg | ORAL_TABLET | ORAL | 0 refills | Status: DC | PRN
  Filled 2020-08-27: qty 30, 5d supply, fill #0

## 2020-08-27 NOTE — TOC Transition Note (Signed)
Transition of Care [facility]) - CM/SW Discharge Note   Patient Details  Name: Katelyn [name] MRN: [id number] Date of Birth: Jul 03, 1940  Transition of Care [facility] CM/SW Contact:  Zenon Mayo, RN Phone Number: 08/27/2020, 8:46 AM   Clinical Narrative:    Patient is for dc today, NCM notified Kenzie with [facility].  She is going home with mini chest tube express. Staff RN to educate patient and to give her a syringe to drain chest tube.   Final next level of care: [place] Barriers to Discharge: No Barriers Identified   Patient Goals and CMS Choice Patient states their goals for this hospitalization and ongoing recovery are:: return home CMS Medicare.gov Compare Post Acute Care list provided to:: Patient Represenative (must comment) Choice offered to / list presented to : [place] [address] / [place]  Discharge Placement                       Discharge Plan and Services                  DME Agency: NA       HH Arranged: RN, PT, OT [place] Agency: [place] ([facility]) Date [place]: 08/20/20 Time [place]: 1014 Representative spoke with at [place]: [place] ([place]) Interventions     Readmission Risk Interventions No flowsheet data found.

## 2020-08-27 NOTE — Care Management Important Message (Signed)
Important Message  Patient Details  Name: Katelyn [name] MRN: [id number] Date of Birth: December 12, 1940   Medicare Important Message Given:  Yes - Important Message mailed due to current National Emergency  Verbal consent obtained due to current National Emergency  Relationship to patient: Self Contact Name: Hailei Call Date: 08/27/20  Time: 1111 Phone: [phone] Outcome: No Answer/Busy Important Message mailed to: Patient address on file    Delorse Lek 08/27/2020, 11:11 AM

## 2020-08-27 NOTE — Progress Notes (Addendum)
      [address]       [place], [address]             [phone]      12 Days Post-Op Procedure(s) (LRB): XI ROBOTIC ASSISTED THORASCOPY-RIGHT UPPER LOBECTOMY (Right) INTERCOSTAL NERVE BLOCK RIGHT (Right) NODE DISSECTION (Right)  Subjective:  Didn't sleep well.  Otherwise is doing okay.  She was given education on how to drain on Mini Express.  + ambulation  + BM  Objective: Vital signs in last 24 hours: Temp:  [97.6 F (36.4 C)-98.2 F (36.8 C)] 97.6 F (36.4 C) (06/28 0415) Pulse Rate:  [64-89] 67 (06/28 0415) Cardiac Rhythm: Atrial fibrillation;Bundle branch block (06/28 0712) Resp:  [15-18] 15 (06/28 0415) BP: (100-120)/(61-77) 111/77 (06/28 0415) SpO2:  [92 %-97 %] 97 % (06/28 0415)  Intake/Output from previous day: 06/27 0701 - 06/28 0700 In: 240 [P.O.:240] Out: 240 [Chest Tube:240]  General appearance: alert, cooperative, and no distress Heart: irregularly irregular rhythm Lungs: clear to auscultation bilaterally Abdomen: soft, non-tender; bowel sounds normal; no masses,  no organomegaly Extremities: extremities normal, atraumatic, no cyanosis or edema Wound: clean and dry  Lab Results: Recent Labs    08/26/20 0048  WBC 11.9*  HGB 12.3  HCT 37.1  PLT 191   BMET:  Recent Labs    08/26/20 0048  NA 135  K 3.9  CL 101  CO2 26  GLUCOSE 115*  BUN 19  CREATININE 1.29*  CALCIUM 8.3*    PT/INR:  Recent Labs    08/27/20 0131  LABPROT 24.7*  INR 2.2*   ABG    Component Value Date/Time   PHART 7.336 (L) 08/13/2020 1501   HCO3 22.2 08/13/2020 1501   TCO2 25 08/31/2014 1609   ACIDBASEDEF 2.7 (H) 08/13/2020 1501   O2SAT 94.3 08/13/2020 1501   CBG (last 3)  No results for input(s): GLUCAP in the last 72 hours.  Assessment/Plan: S/P Procedure(s) (LRB): XI ROBOTIC ASSISTED THORASCOPY-RIGHT UPPER LOBECTOMY (Right) INTERCOSTAL NERVE BLOCK RIGHT (Right) NODE DISSECTION (Right)  CV- chronic PAF, rate controlled- continue  Amiodarone, Lopressor INR 2.2, continue coumadin at home regimen Pulm- CT output >200 cc output, no air leak on mini express appreciated, CXR with stable pneumothorax Deconditioning continue PT/OT, home health has been arranged Dispo- patient stable, now ambulating without significant difficulty, INR is therapeutic, will d/c home today   LOS: 12 days    Ellwood Handler, PA-C 08/27/2020 Patient seen and examined, agree with above I do not see an air leak this AM. Still has a space on CXR Plan to dc with follow up and possible tube removal on Friday  Aviendha Azbell C. Roxan Hockey, MD [facility] [phone]

## 2020-08-27 NOTE — Discharge Instructions (Addendum)
Please set up appointment to have Coumadin level checked for late to early next week  Please record daily chest tube drainage  Drain Mini Express box as needed, with provided syringe and instructions showed in the room  Contact office at [phone] if you have any trouble

## 2020-08-28 ENCOUNTER — Telehealth (INDEPENDENT_AMBULATORY_CARE_PROVIDER_SITE_OTHER): Payer: Self-pay

## 2020-08-28 ENCOUNTER — Other Ambulatory Visit: Payer: Self-pay | Admitting: Thoracic Surgery (Cardiothoracic Vascular Surgery)

## 2020-08-28 DIAGNOSIS — I480 Paroxysmal atrial fibrillation: Secondary | ICD-10-CM | POA: Diagnosis not present

## 2020-08-28 DIAGNOSIS — I700 Atherosclerosis of aorta: Secondary | ICD-10-CM | POA: Diagnosis not present

## 2020-08-28 DIAGNOSIS — I10 Essential (primary) hypertension: Secondary | ICD-10-CM | POA: Diagnosis not present

## 2020-08-28 DIAGNOSIS — Z483 Aftercare following surgery for neoplasm: Secondary | ICD-10-CM | POA: Diagnosis not present

## 2020-08-28 DIAGNOSIS — E785 Hyperlipidemia, unspecified: Secondary | ICD-10-CM | POA: Diagnosis not present

## 2020-08-28 DIAGNOSIS — Z791 Long term (current) use of non-steroidal anti-inflammatories (NSAID): Secondary | ICD-10-CM | POA: Diagnosis not present

## 2020-08-28 DIAGNOSIS — I2510 Atherosclerotic heart disease of native coronary artery without angina pectoris: Secondary | ICD-10-CM | POA: Diagnosis not present

## 2020-08-28 DIAGNOSIS — K219 Gastro-esophageal reflux disease without esophagitis: Secondary | ICD-10-CM | POA: Diagnosis not present

## 2020-08-28 DIAGNOSIS — M1990 Unspecified osteoarthritis, unspecified site: Secondary | ICD-10-CM | POA: Diagnosis not present

## 2020-08-28 DIAGNOSIS — R918 Other nonspecific abnormal finding of lung field: Secondary | ICD-10-CM

## 2020-08-28 DIAGNOSIS — D696 Thrombocytopenia, unspecified: Secondary | ICD-10-CM | POA: Diagnosis not present

## 2020-08-28 DIAGNOSIS — Z79891 Long term (current) use of opiate analgesic: Secondary | ICD-10-CM | POA: Diagnosis not present

## 2020-08-28 DIAGNOSIS — I495 Sick sinus syndrome: Secondary | ICD-10-CM | POA: Diagnosis not present

## 2020-08-28 DIAGNOSIS — I70203 Unspecified atherosclerosis of native arteries of extremities, bilateral legs: Secondary | ICD-10-CM | POA: Diagnosis not present

## 2020-08-28 DIAGNOSIS — R634 Abnormal weight loss: Secondary | ICD-10-CM | POA: Diagnosis not present

## 2020-08-28 DIAGNOSIS — J439 Emphysema, unspecified: Secondary | ICD-10-CM | POA: Diagnosis not present

## 2020-08-28 DIAGNOSIS — R1319 Other dysphagia: Secondary | ICD-10-CM | POA: Diagnosis not present

## 2020-08-28 DIAGNOSIS — I679 Cerebrovascular disease, unspecified: Secondary | ICD-10-CM | POA: Diagnosis not present

## 2020-08-28 DIAGNOSIS — Z952 Presence of prosthetic heart valve: Secondary | ICD-10-CM | POA: Diagnosis not present

## 2020-08-28 DIAGNOSIS — C3411 Malignant neoplasm of upper lobe, right bronchus or lung: Secondary | ICD-10-CM | POA: Diagnosis not present

## 2020-08-28 DIAGNOSIS — Z7901 Long term (current) use of anticoagulants: Secondary | ICD-10-CM | POA: Diagnosis not present

## 2020-08-28 DIAGNOSIS — Z87891 Personal history of nicotine dependence: Secondary | ICD-10-CM | POA: Diagnosis not present

## 2020-08-28 DIAGNOSIS — I447 Left bundle-branch block, unspecified: Secondary | ICD-10-CM | POA: Diagnosis not present

## 2020-08-28 NOTE — Telephone Encounter (Signed)
Care Nurse is calling for verbal orders to be signed off for the [facility] to go out to see Mrs Katelyn [name] 1x/wk for 9 wks; then following with 3 PRN visits. Verbal permission was given as normal protocol per Dr Anastasio Champion.  She does a have a chest tube; which is draining well. She will f/u with surgeon in a couple of days soon. Then f/u with Dr Anastasio Champion afterwards. Any order to be signed off is to be sent to complete After September 09, 2020. Dr Will be out of office/ country. They will notify all [facility] nursing staff to hold faxes until the return.

## 2020-08-28 NOTE — Telephone Encounter (Signed)
Great! I too agree with providing verbal orders if you need anything from me.

## 2020-08-28 NOTE — Telephone Encounter (Signed)
Ok sounds good. Got it!

## 2020-08-29 ENCOUNTER — Telehealth (INDEPENDENT_AMBULATORY_CARE_PROVIDER_SITE_OTHER): Payer: Self-pay

## 2020-08-29 ENCOUNTER — Other Ambulatory Visit: Payer: Self-pay | Admitting: *Deleted

## 2020-08-29 NOTE — Progress Notes (Signed)
The proposed treatment discussed in cancer conference today is for discussion purpose only and is not a binding recommendation.  The patient was not physically examined nor present for their treatment options. Therefore, final treatment plans cannot be decided.

## 2020-08-29 NOTE — Telephone Encounter (Signed)
Called patient and gave her the message from Judson Roch and patient stated that she normally checks on Mondays and will start next Monday. Patient verbalized an understanding and thanked [place].

## 2020-08-29 NOTE — Telephone Encounter (Signed)
Patient just returned home from hospital and started back on medicines on Monday and wanted to know when she needs to check her INR.  [phone]  Please advise.

## 2020-08-30 ENCOUNTER — Ambulatory Visit (INDEPENDENT_AMBULATORY_CARE_PROVIDER_SITE_OTHER): Payer: Self-pay | Admitting: Thoracic Surgery (Cardiothoracic Vascular Surgery)

## 2020-08-30 ENCOUNTER — Ambulatory Visit
Admission: RE | Admit: 2020-08-30 | Discharge: 2020-08-30 | Disposition: A | Discharge status: Home / Self Care | Payer: Medicare Other | Source: Ambulatory Visit | Attending: Thoracic Surgery (Cardiothoracic Vascular Surgery) | Admitting: Thoracic Surgery (Cardiothoracic Vascular Surgery)

## 2020-08-30 ENCOUNTER — Other Ambulatory Visit: Payer: Self-pay | Admitting: *Deleted

## 2020-08-30 ENCOUNTER — Other Ambulatory Visit: Payer: Self-pay

## 2020-08-30 VITALS — BP 103/65 | HR 67 | Resp 20 | Ht 70.0 in | Wt 171.0 lb

## 2020-08-30 DIAGNOSIS — Z902 Acquired absence of lung [part of]: Secondary | ICD-10-CM

## 2020-08-30 DIAGNOSIS — Z4682 Encounter for fitting and adjustment of non-vascular catheter: Secondary | ICD-10-CM | POA: Diagnosis not present

## 2020-08-30 DIAGNOSIS — R918 Other nonspecific abnormal finding of lung field: Secondary | ICD-10-CM

## 2020-08-30 DIAGNOSIS — J939 Pneumothorax, unspecified: Secondary | ICD-10-CM | POA: Diagnosis not present

## 2020-08-30 DIAGNOSIS — Z952 Presence of prosthetic heart valve: Secondary | ICD-10-CM | POA: Diagnosis not present

## 2020-08-30 NOTE — Progress Notes (Signed)
[address]       [place] [address]             [phone]       HPI: Katelyn [name] returns for a scheduled follow-up visit  Katelyn [name] is a 80 year old woman with a history of heavy tobacco abuse, GOLD class II COPD, severe aortic stenosis, status post TAVR, severe thoracic aortic atherosclerotic disease, coronary disease, PAD, stroke, atrial fibrillation, anticoagulation, hypertension, hyperlipidemia, arthritis, and reflux.  She developed a cough in the spring 2022.  She was found to have a 4 cm right upper lobe lung mass.  On PET/CT it was hypermetabolic.  There was some mild to moderately increased activity in hilar and mediastinal lymph nodes many of which were calcified.  I did a robotic right upper lobectomy and node dissection on 08/15/2020.  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  Postoperatively she had problems with weakness initially.  That pretty much resolved by the time she went home.  She also had an air leak.  It was questionable whether the air leak had resolved on the day of discharge, but she was ready to go and I was not comfortable for the tube at that time.  She went home with a chest tube and now returns for follow-up.  She says that she feels really tired today, she felt really good yesterday and was very active than even supervised some type of dinner at church.  She has taken 2 pain pills since she went home.  She has not had any shortness of breath.  Past Medical History:  Diagnosis Date   Allergy    rhinitis   Aortic stenosis, severe    a. s/p TAVR 08/2014.   Arthritis    spine and various joints   Carotid artery disease (HCC)    Cataract    Cerebrovascular disease 12/05/2018   Childhood asthma    Chronic bronchitis ([place])        Claudication ([place])    COPD GOLD II 07/18/2020   Coronary artery disease    a. April 2012 which revealed mid RCA occlusion with collaterals, 50% LAD stenosis, 30%  circumflex stenosis, and 40% marginal stenosis   First degree AV block    GERD (gastroesophageal reflux disease)    Heart murmur    Hyperlipidemia    Hypertension    Left bundle branch block    LUMBAR RADICULOPATHY, RIGHT    PAF (paroxysmal atrial fibrillation) (HCC)    Peripheral vascular disease ([place])    a. s/p aortobifem bypass graft surgery and bilateral fundoplasty by [facility].   Renal artery stenosis ([place])    a. renal artery stenosis (>60% L renal artery) by duplex 04/2020.   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 bilateral subclavian stenosis with antegrade flow by duplex 07/2020    Current Outpatient Medications  Medication Sig Dispense Refill   amiodarone (PACERONE) 200 MG tablet Take 1 tablet by mouth once daily 90 tablet 3   Cholecalciferol (VITAMIN D3) 50 MCG (2000 UT) capsule Take 2,000 Units by mouth daily.     estradiol (ESTRACE) 2  MG tablet Take 1 tablet by mouth once daily 90 tablet 0   ipratropium (ATROVENT) 0.06 % nasal spray USE 1 SPRAY(S) IN EACH NOSTRIL EVERY 6 HOURS AS NEEDED FOR RUNNY NOSE 15 mL 6   meloxicam (MOBIC) 15 MG tablet Take 1 tablet by mouth once daily 30 tablet 3   metoprolol tartrate (LOPRESSOR) 25 MG tablet Take 0.5 tablets (12.5 mg total) by mouth 2 (two) times daily. 60 tablet 3   oxyCODONE (OXY IR/ROXICODONE) 5 MG immediate release tablet Take 1 tablet (5 mg total) by mouth every 4 (four) hours as needed for moderate pain. 30 tablet 0   pantoprazole (PROTONIX) 40 MG tablet TAKE 1 TABLET BY MOUTH ONCE DAILY BEFORE SUPPER 90 tablet 3   rosuvastatin (CRESTOR) 20 MG tablet TAKE 1 TABLET BY MOUTH AT BEDTIME 90 tablet 0   warfarin (COUMADIN) 2.5 MG tablet Take 1 tablet by mouth  once daily 90 tablet 0   nitroGLYCERIN (NITROSTAT) 0.4 MG SL tablet Place 1 tablet (0.4 mg total) under the tongue every 5 (five) minutes as needed for chest pain. 25 tablet 12   NP THYROID 90 MG tablet Take 1 tablet by mouth once daily (Patient taking differently: Take 90 mg by mouth daily.) 90 tablet 0   No current facility-administered medications for this visit.    Physical Exam BP 103/65 (BP Location: Right Arm, Patient Position: Sitting)   Pulse 67   Resp 20   Ht 5\' 10"  (1.778 m)   Wt 171 lb (77.6 kg)   SpO2 100% Comment: RA  BMI 24.50 kg/m  80 year old woman in no acute distress Alert and oriented x3 with no focal deficits Lungs diminished at right base Incisions clean dry and intact Chest tube with no air leak  Diagnostic Tests: CHEST - 2 VIEW   COMPARISON:  PA and lateral chest 08/27/2020.   FINDINGS: The patient's right chest tube has partially backed out. Right apical pneumothorax persists but is smaller in size. The left lung is expanded and clear. Aortic valve replacement noted. Heart size is normal.   IMPRESSION: Small right pneumothorax is decreased in size since the most recent exam. Right chest tube has partially backed out. The exam is otherwise unchanged.     Electronically Signed   By: Inge Rise M.D.   On: 08/30/2020 13:54 I personally reviewed the chest x-ray images.  The tube has pulled out slightly but all the sideholes are still in the chest.  The apical space has decreased in size since her prior film.  Impression: Katelyn [name] is a 80 year old woman with a history of heavy tobacco abuse, GOLD class II COPD, severe aortic stenosis, status post TAVR, severe thoracic aortic atherosclerotic disease, coronary disease, PAD, stroke, atrial fibrillation, anticoagulation, hypertension, hyperlipidemia, arthritis, and reflux.  She had a robotic assisted right upper lobectomy and node dissection for a T2b, N0, stage IIa large cell neuroendocrine tumor  on 08/15/2020.  Postoperative prolonged air leak-her airleak was always relatively small.  I think it was probably gone on the day of discharge but she did not want to wait another day to try to get the tube out.  On exam today there is no evidence of an air leak.  We will go ahead and remove the tube.  Postoperative pain-she only had to take 2 pain pills since she was discharged a few days ago.  She has pain medication if needed.  I encouraged her to moderate her activity so that she does not have extreme  differences from day-to-day.  I do not want her driving yet.  If she is doing well next week and not having to take pain medication during the day, she may be able to start running some errands around town.  Procedure: Right chest tube removed without difficulty.  Plan: Will send down for a PA and lateral chest x-ray.  If that looks okay we will let her go home today and then come back for a follow-up appointment next week.  I will be out of the office but we will have her see one of the PAs.  If her chest x-ray is okay then I will see her back in a month to check on her progress.  She has an appointment with Dr. Delton Coombes next week and Dr. Anastasio Champion on the 20th.  Melrose Nakayama, MD [facility] [phone]

## 2020-08-31 DIAGNOSIS — I10 Essential (primary) hypertension: Secondary | ICD-10-CM | POA: Diagnosis not present

## 2020-08-31 DIAGNOSIS — C3411 Malignant neoplasm of upper lobe, right bronchus or lung: Secondary | ICD-10-CM | POA: Diagnosis not present

## 2020-08-31 DIAGNOSIS — J439 Emphysema, unspecified: Secondary | ICD-10-CM | POA: Diagnosis not present

## 2020-08-31 DIAGNOSIS — I2510 Atherosclerotic heart disease of native coronary artery without angina pectoris: Secondary | ICD-10-CM | POA: Diagnosis not present

## 2020-08-31 DIAGNOSIS — I480 Paroxysmal atrial fibrillation: Secondary | ICD-10-CM | POA: Diagnosis not present

## 2020-08-31 DIAGNOSIS — Z483 Aftercare following surgery for neoplasm: Secondary | ICD-10-CM | POA: Diagnosis not present

## 2020-09-03 ENCOUNTER — Ambulatory Visit: Payer: Medicare Other | Admitting: Thoracic Surgery (Cardiothoracic Vascular Surgery)

## 2020-09-03 DIAGNOSIS — I2510 Atherosclerotic heart disease of native coronary artery without angina pectoris: Secondary | ICD-10-CM | POA: Diagnosis not present

## 2020-09-03 DIAGNOSIS — J439 Emphysema, unspecified: Secondary | ICD-10-CM | POA: Diagnosis not present

## 2020-09-03 DIAGNOSIS — Z483 Aftercare following surgery for neoplasm: Secondary | ICD-10-CM | POA: Diagnosis not present

## 2020-09-03 DIAGNOSIS — C3411 Malignant neoplasm of upper lobe, right bronchus or lung: Secondary | ICD-10-CM | POA: Diagnosis not present

## 2020-09-03 DIAGNOSIS — I10 Essential (primary) hypertension: Secondary | ICD-10-CM | POA: Diagnosis not present

## 2020-09-03 DIAGNOSIS — I480 Paroxysmal atrial fibrillation: Secondary | ICD-10-CM | POA: Diagnosis not present

## 2020-09-04 ENCOUNTER — Other Ambulatory Visit: Payer: Self-pay

## 2020-09-04 ENCOUNTER — Ambulatory Visit: Payer: Medicare Other

## 2020-09-04 ENCOUNTER — Encounter: Payer: Self-pay | Admitting: *Deleted

## 2020-09-04 ENCOUNTER — Encounter (HOSPITAL_COMMUNITY): Payer: Self-pay | Admitting: Hematology and Oncology

## 2020-09-04 ENCOUNTER — Telehealth (INDEPENDENT_AMBULATORY_CARE_PROVIDER_SITE_OTHER): Payer: Self-pay | Admitting: Nurse Practitioner

## 2020-09-04 ENCOUNTER — Inpatient Hospital Stay (HOSPITAL_COMMUNITY): Payer: Medicare Other | Attending: Hematology and Oncology | Admitting: Hematology and Oncology

## 2020-09-04 DIAGNOSIS — I679 Cerebrovascular disease, unspecified: Secondary | ICD-10-CM | POA: Insufficient documentation

## 2020-09-04 DIAGNOSIS — C3411 Malignant neoplasm of upper lobe, right bronchus or lung: Secondary | ICD-10-CM

## 2020-09-04 DIAGNOSIS — Z87891 Personal history of nicotine dependence: Secondary | ICD-10-CM | POA: Diagnosis not present

## 2020-09-04 DIAGNOSIS — I739 Peripheral vascular disease, unspecified: Secondary | ICD-10-CM | POA: Insufficient documentation

## 2020-09-04 DIAGNOSIS — R011 Cardiac murmur, unspecified: Secondary | ICD-10-CM | POA: Insufficient documentation

## 2020-09-04 DIAGNOSIS — I480 Paroxysmal atrial fibrillation: Secondary | ICD-10-CM | POA: Insufficient documentation

## 2020-09-04 DIAGNOSIS — Z7901 Long term (current) use of anticoagulants: Secondary | ICD-10-CM | POA: Diagnosis not present

## 2020-09-04 DIAGNOSIS — Z8673 Personal history of transient ischemic attack (TIA), and cerebral infarction without residual deficits: Secondary | ICD-10-CM | POA: Insufficient documentation

## 2020-09-04 DIAGNOSIS — J449 Chronic obstructive pulmonary disease, unspecified: Secondary | ICD-10-CM | POA: Diagnosis not present

## 2020-09-04 DIAGNOSIS — I2510 Atherosclerotic heart disease of native coronary artery without angina pectoris: Secondary | ICD-10-CM | POA: Insufficient documentation

## 2020-09-04 DIAGNOSIS — I350 Nonrheumatic aortic (valve) stenosis: Secondary | ICD-10-CM | POA: Insufficient documentation

## 2020-09-04 DIAGNOSIS — Z952 Presence of prosthetic heart valve: Secondary | ICD-10-CM | POA: Diagnosis not present

## 2020-09-04 DIAGNOSIS — I701 Atherosclerosis of renal artery: Secondary | ICD-10-CM | POA: Insufficient documentation

## 2020-09-04 DIAGNOSIS — Z902 Acquired absence of lung [part of]: Secondary | ICD-10-CM | POA: Insufficient documentation

## 2020-09-04 DIAGNOSIS — Z79899 Other long term (current) drug therapy: Secondary | ICD-10-CM | POA: Diagnosis not present

## 2020-09-04 DIAGNOSIS — J90 Pleural effusion, not elsewhere classified: Secondary | ICD-10-CM | POA: Insufficient documentation

## 2020-09-04 DIAGNOSIS — J439 Emphysema, unspecified: Secondary | ICD-10-CM | POA: Diagnosis not present

## 2020-09-04 DIAGNOSIS — Z483 Aftercare following surgery for neoplasm: Secondary | ICD-10-CM | POA: Diagnosis not present

## 2020-09-04 DIAGNOSIS — I10 Essential (primary) hypertension: Secondary | ICD-10-CM | POA: Diagnosis not present

## 2020-09-04 HISTORY — DX: Malignant neoplasm of upper lobe, right bronchus or lung: C34.11

## 2020-09-04 NOTE — Telephone Encounter (Signed)
Please call patient let her know that her INR is at the low end of goal but it is in goal (INR result is 2.0 ) so she can continue her current dose of warfarin and recheck INR next week.  Thank you.

## 2020-09-04 NOTE — Telephone Encounter (Signed)
Called patient and left a detailed voice message of the message from Judson Roch on her VM.

## 2020-09-04 NOTE — Progress Notes (Signed)
I received a message from Dr. Alvy Bimler.  She would like molecular testing to be sent to [place] one. I notified pathology of her request.

## 2020-09-05 ENCOUNTER — Encounter (HOSPITAL_COMMUNITY): Payer: Self-pay | Admitting: Hematology and Oncology

## 2020-09-05 ENCOUNTER — Telehealth (INDEPENDENT_AMBULATORY_CARE_PROVIDER_SITE_OTHER): Payer: Self-pay

## 2020-09-05 DIAGNOSIS — J439 Emphysema, unspecified: Secondary | ICD-10-CM | POA: Diagnosis not present

## 2020-09-05 DIAGNOSIS — I480 Paroxysmal atrial fibrillation: Secondary | ICD-10-CM | POA: Diagnosis not present

## 2020-09-05 DIAGNOSIS — Z483 Aftercare following surgery for neoplasm: Secondary | ICD-10-CM | POA: Diagnosis not present

## 2020-09-05 DIAGNOSIS — C3411 Malignant neoplasm of upper lobe, right bronchus or lung: Secondary | ICD-10-CM | POA: Diagnosis not present

## 2020-09-05 DIAGNOSIS — I10 Essential (primary) hypertension: Secondary | ICD-10-CM | POA: Diagnosis not present

## 2020-09-05 DIAGNOSIS — I2510 Atherosclerotic heart disease of native coronary artery without angina pectoris: Secondary | ICD-10-CM | POA: Diagnosis not present

## 2020-09-05 NOTE — Assessment & Plan Note (Signed)
She has mild cough which is not unexpected after surgery Her examination is benign She is reassured

## 2020-09-05 NOTE — Progress Notes (Signed)
[name] FOLLOW-UP progress notes  Patient Care Team: Katelyn Albee, MD as PCP - General (Internal Medicine) Katelyn Sine, MD as PCP - Cardiology (Cardiology) Katelyn Sine, MD as Attending Physician (Cardiology) Katelyn Dolin, MD as Consulting Physician (Gastroenterology) Katelyn [name], [place] as Physician Assistant (Cardiology)  CHIEF COMPLAINTS/PURPOSE OF VISIT:  Lung cancer, for further evaluation  HISTORY OF PRESENTING ILLNESS:  Katelyn [name] 80 y.o. female is seen after recent thoracic surgery She is here accompanied by family She is doing well except still have incisional discomfort She has nonproductive cough and shortness of breath but not significant According to her daughter, there is mild clear drainage at the site of incision but no signs of infection such as fever or chills She has no bleeding complications from her anticoagulation therapy Her intake is poor and she has lost some weight since her surgery  I reviewed the patient's records extensive and collaborated the history with the patient. Summary of her history is as follows: Oncology History  Cancer of upper lobe of right lung (Katelyn [name])  08/15/2020 Pathology Results   FINAL MICROSCOPIC DIAGNOSIS:   A. 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  - Tumor is limited to lung.  - Margins are negative for carcinoma.  - One of one lymph nodes negative for carcinoma (0/1).  - See oncology table.   B. LYMPH NODE, 7, EXCISION:  - One of one lymph nodes negative for carcinoma (0/1).   C. LYMPH NODE, 7 #2, EXCISION:  - One of one lymph nodes negative for carcinoma (0/1).   D. LYMPH NODE, 9, EXCISION:  - One of one lymph nodes negative for carcinoma (0/1).   E. LYMPH NODE, 8, EXCISION:  - One of one lymph nodes negative for carcinoma (0/1).   F. LYMPH NODE, 7 #3, EXCISION:  - One of one lymph nodes negative for carcinoma (0/1).   G. LYMPH NODE,  11, EXCISION:  - One of one lymph nodes negative for carcinoma (0/1).   H. LYMPH NODE, 10, EXCISION:  - One of one lymph nodes negative for carcinoma (0/1).   I. LYMPH NODE, 4R, EXCISION:  - One of one lymph nodes negative for carcinoma (0/1).   J. LYMPH NODE, 10 #2, EXCISION:   - One of one lymph nodes negative for carcinoma (0/1).  - Fibrosis and calcifications.   K. LYMPH NODE, 12, EXCISION:  - One of one lymph nodes negative for carcinoma (0/1).   L. LYMPH NODE, 12 #2, EXCISION:  - One of one lymph nodes negative for carcinoma (0/1).   ONCOLOGY TABLE:   LUNG: Resection   Synchronous Tumors: Not applicable  Total Number of Primary Tumors: 1  Procedure: Right upper lobe lobectomy with lymph node biopsies  Specimen Laterality: Right  Tumor Focality: Unifocal  Tumor Site: Right upper lobe  Tumor Size: 4.4 cm  Histologic Type: Large cell neuroendocrine carcinoma  Visceral Pleura Invasion: Not identified  Direct Invasion of Adjacent Structures: No adjacent structures present  Lymphovascular Invasion: Not identified  Margins: All margins negative for invasive carcinoma       Closest Margin(s) to Invasive Carcinoma: 1.5 cm to bronchial margin  Treatment Effect: No known presurgical therapy  Regional Lymph Nodes:       Number of Lymph Nodes Involved: 0  Nodal Sites with Tumor: Not applicable       Number of Lymph Nodes Examined: 12                       Nodal Sites Examined: 4, 7, 8, 9, 10, 11, 12  Distant Metastasis:       Distant Site(s) Involved: Not applicable  Pathologic Stage Classification (pTNM, AJCC 8th Edition): pT2b, pN0  Ancillary Studies: Can be performed upon request  Representative Tumor Block: A2  Comment(s): The tumor has a subtle neuroendocrine features, cribriform areas with necrosis, and geographic areas of necrosis.  Immunohistochemistry is positive for CD56, synaptophysin, and TTF-1.  Chromogranin is largely negative. Ki-67  is elevated. Dr. Saralyn [name] has reviewed the case.     08/15/2020 Surgery   PREOPERATIVE DIAGNOSIS:  Right upper lobe lung mass, suspected non-small cell carcinoma.   POSTOPERATIVE DIAGNOSIS:  Non-small cell carcinoma, right upper lobe, clinical stage IB (T2, N0).   PROCEDURE:   Xi robotic-assisted right upper lobectomy,  Lymph node dissection,  Intercostal nerve blocks levels 3 through 10.   SURGEON:  Katelyn Charon, MD    FINDINGS:  Fragile tissue.  Enlarged but otherwise benign appearing nodes.  One calcified node.  Frozen section revealed non-small cell carcinoma.  Bronchial margin clear.   09/04/2020 Initial Diagnosis   Cancer of upper lobe of right lung ([place])   09/04/2020 Cancer Staging   Staging form: Lung, AJCC 8th Edition - Pathologic stage from 09/04/2020: pT2, pN0, cM0 - Signed by Katelyn Lark, MD on 09/04/2020  Stage prefix: Initial diagnosis      MEDICAL HISTORY:  Past Medical History:  Diagnosis Date   Allergy    rhinitis   Aortic stenosis, severe    a. s/p TAVR 08/2014.   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 April 2012 which revealed mid RCA occlusion with collaterals, 50% LAD stenosis, 30% circumflex stenosis, and 40% marginal stenosis   First degree AV block    GERD (gastroesophageal reflux disease)    Heart murmur    Hyperlipidemia    Hypertension    Left bundle branch block    LUMBAR RADICULOPATHY, RIGHT    PAF (paroxysmal atrial fibrillation) ([facility])    Peripheral vascular disease ([place])    a. s/p aortobifem bypass graft surgery and bilateral fundoplasty by [facility].   Renal artery stenosis ([place])    a. renal artery stenosis (>60% L renal artery) by duplex 04/2020.   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 bilateral subclavian stenosis with antegrade flow by duplex 07/2020    SURGICAL HISTORY: Past Surgical History:  Procedure Laterality Date   AORTO-FEMORAL BYPASS GRAFT Bilateral 06/27/10   BASAL CELL CARCINOMA EXCISION Left    face   CARDIAC CATHETERIZATION N/A 07/25/2014   Procedure: Right/Left Heart Cath and  Coronary Angiography;  Surgeon: Katelyn Sine, MD;  Location: [facility];  Service: Cardiovascular;  Laterality: N/A;   CATARACT EXTRACTION, BILATERAL Bilateral 09/29/2016   COLONOSCOPY  2008   Dr. Laural Golden: normal.    DILATION AND CURETTAGE OF UTERUS  "2 or 3"   ESOPHAGOGASTRODUODENOSCOPY N/A 08/18/2017   Dr. Gala Romney: Normal-appearing esophagus status post dilation.  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  Small hiatal hernia.   INTERCOSTAL NERVE BLOCK Right 08/15/2020   Procedure: INTERCOSTAL NERVE BLOCK RIGHT;  Surgeon: Melrose Nakayama, MD;  Location: [facility];  Service: Thoracic;  Laterality: Right;   MALONEY DILATION N/A 08/18/2017   Procedure: Venia Minks DILATION;  Surgeon: Katelyn Dolin, MD;  [facility];  Service: Endoscopy;  Laterality: N/A;   MULTIPLE TOOTH EXTRACTIONS     NODE DISSECTION Right 08/15/2020   Procedure: NODE DISSECTION;  Surgeon: Melrose Nakayama, MD;  Location: [facility];  Service: Thoracic;  Laterality: Right;   PR VEIN BYPASS GRAFT,AORTO-FEM-POP  06/12/10   TEE WITHOUT CARDIOVERSION N/A 08/31/2014   Procedure: TRANSESOPHAGEAL ECHOCARDIOGRAM (TEE);  Surgeon: Rexene Alberts, MD;  Location: [facility];  Service: Open Heart Surgery;  Laterality: N/A;   TRANSCATHETER AORTIC VALVE REPLACEMENT, TRANSFEMORAL N/A 08/31/2014   Procedure: TRANSCATHETER AORTIC  VALVE REPLACEMENT, TRANSFEMORAL approach;  Surgeon: Rexene Alberts, MD;  Location: [facility];  Service: Open Heart Surgery;  Laterality: N/A;   TUBAL LIGATION  1970's   VAGINAL HYSTERECTOMY  1970's   Partial     SOCIAL HISTORY: Social History   Socioeconomic History   Marital status: Widowed    Spouse name: bobby   Number of children: 1   Years of education: Not on file   Highest education level: High school graduate  Occupational History   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 Retired,used to work at [email or web address].Lives alone.         Patient has one daughter, 3 grands   Likes to crochet   Works in [employer].    Social Determinants of Health   Financial Resource Strain: Not on file  Food Insecurity: Not on file  Transportation Needs: Not on file  Physical Activity: Not on file  Stress: Not on file  Social Connections: Not on file  Intimate Partner Violence: Not on file    FAMILY HISTORY: Family History  Problem Relation Age of Onset   Heart disease Mother 27       died young   Varicose Veins Mother    Stroke Father    Hypertension Father    Heart disease Father 13       Aneurysm   Heart attack Father    Arthritis Brother    Stroke Brother    Diabetes Daughter    Arthritis Daughter    Heart attack Brother        unsure  Allergic rhinitis Neg Hx    Angioedema Neg Hx    Asthma Neg Hx    Atopy Neg Hx    Eczema Neg Hx    Immunodeficiency Neg Hx    Urticaria Neg Hx      ALLERGIES:  is allergic to adhesive [tape], latex, and tetanus toxoids.  MEDICATIONS:  Current Outpatient Medications  Medication Sig Dispense Refill   amiodarone (PACERONE) 200 MG tablet Take 1 tablet by mouth once daily 90 tablet 3   Cholecalciferol (VITAMIN D3) 50 MCG (2000 UT) capsule Take 2,000 Units by mouth daily.     estradiol (ESTRACE) 2 MG tablet Take 1 tablet by mouth once daily 90 tablet 0   meloxicam (MOBIC) 15 MG tablet Take 1 tablet by mouth once daily 30 tablet 3   metoprolol tartrate (LOPRESSOR) 25 MG tablet Take 0.5 tablets (12.5 mg total) by mouth 2 (two) times daily. 60 tablet 3   oxyCODONE (OXY IR/ROXICODONE) 5 MG immediate release tablet Take 1 tablet (5 mg total) by mouth every 4 (four) hours as needed for moderate pain. 30 tablet 0   pantoprazole (PROTONIX) 40 MG tablet TAKE 1 TABLET BY MOUTH ONCE DAILY BEFORE SUPPER 90 tablet 3   rosuvastatin (CRESTOR) 20 MG tablet TAKE 1 TABLET BY MOUTH AT BEDTIME 90 tablet 0   warfarin (COUMADIN) 2.5 MG tablet Take 1 tablet by mouth once daily 90 tablet 0   ipratropium (ATROVENT) 0.06 % nasal spray USE 1 SPRAY(S) IN EACH NOSTRIL EVERY 6 HOURS AS NEEDED FOR RUNNY NOSE (Patient not taking: Reported on 09/04/2020) 15 mL 6   nitroGLYCERIN (NITROSTAT) 0.4 MG SL tablet Place 1 tablet (0.4 mg total) under the tongue every 5 (five) minutes as needed for chest pain. 25 tablet 12   NP THYROID 90 MG tablet Take 1 tablet by mouth once daily (Patient taking differently: Take 90 mg by mouth daily.) 90 tablet 0   No current facility-administered medications for this visit.    REVIEW OF SYSTEMS:   Constitutional: Denies fevers, chills or abnormal night sweats Eyes: Denies blurriness of vision, double vision or watery eyes Ears, nose, mouth, throat, and face: Denies mucositis or sore throat Cardiovascular: Denies palpitation, chest discomfort or lower extremity swelling Gastrointestinal:  Denies nausea, heartburn or change in bowel  habits Skin: Denies abnormal skin rashes Lymphatics: Denies new lymphadenopathy or easy bruising Neurological:Denies numbness, tingling or new weaknesses Behavioral/Psych: Mood is stable, no new changes  All other systems were reviewed with the patient and are negative.  PHYSICAL EXAMINATION: ECOG PERFORMANCE STATUS: 1 - Symptomatic but completely ambulatory  Vitals:   09/04/20 1347  BP: (!) 150/88  Pulse: 93  Resp: 20  Temp: (!) 96.7 F (35.9 C)  SpO2: 97%   Filed Weights   09/04/20 1347  Weight: 171 lb 4.8 oz (77.7 kg)    GENERAL:alert, no distress and comfortable SKIN: skin color, texture, turgor are normal, no rashes or significant lesions EYES: normal, conjunctiva are pink and non-injected, sclera clear OROPHARYNX:no exudate, normal lips, buccal mucosa, and tongue  NECK: supple, thyroid normal size, non-tender, without nodularity LYMPH:  no palpable lymphadenopathy in the cervical, axillary or inguinal LUNGS: clear to auscultation and percussion with normal breathing effort.  Noted well-healed surgical scar HEART: regular rate & rhythm and no murmurs without lower extremity edema ABDOMEN:abdomen soft, non-tender and normal bowel sounds Musculoskeletal:no cyanosis of digits and no clubbing  PSYCH: alert & oriented x 3 with fluent speech NEURO: no focal motor/sensory deficits  LABORATORY  DATA:  I have reviewed the data as listed Lab Results  Component Value Date   WBC 11.9 (H) 08/26/2020   HGB 12.3 08/26/2020   HCT 37.1 08/26/2020   MCV 95.1 08/26/2020   PLT 191 08/26/2020   Recent Labs    03/08/20 0913 03/20/20 1039 06/26/20 1040 08/13/20 1434 08/15/20 1555 08/17/20 0027 08/18/20 0101 08/21/20 0042 08/23/20 0059 08/26/20 0048  NA 139 137 139 135   < > 133*   < > 133* 134* 135  K 4.5 5.0 4.9 4.6   < > 5.0   < > 4.6 4.4 3.9  CL 102 101 104 106   < > 101   < > 102 99 101  CO2 '25 26 26 22   ' < > 26   < > '23 26 26  ' GLUCOSE 110* 96 114 103*   < > 121*   < >  111* 112* 115*  BUN 38* 31* 23 27*   < > 31*   < > 24* 24* 19  CREATININE 1.70* 1.49* 1.31* 1.86*   < > 2.00*   < > 1.16* 1.21* 1.29*  CALCIUM 8.7 8.9 9.2 8.9   < > 8.3*   < > 8.5* 8.5* 8.3*  GFRNONAA 29* 34* 39* 28*   < > 25*   < > 49* 46* 43*  GFRAA 33* 39* 45*  --   --   --   --   --   --   --   PROT  --   --  6.8 6.7  --  5.4*  --   --   --   --   ALBUMIN  --   --   --  3.5  --  2.7*  --   --   --   --   AST  --   --  15 20  --  26  --   --   --   --   ALT  --   --  10 16  --  11  --   --   --   --   ALKPHOS  --   --   --  74  --  54  --   --   --   --   BILITOT  --   --  0.9 0.8  --  0.7  --   --   --   --    < > = values in this interval not displayed.    RADIOGRAPHIC STUDIES: I have personally reviewed the radiological images as listed and agreed with the findings in the report. DG Chest 2 View  Result Date: 08/30/2020 CLINICAL DATA:  Patient status post right upper lobectomy 08/16/2020. Right pneumothorax with a chest tube in place. EXAM: CHEST - 2 VIEW COMPARISON:  PA and lateral chest 08/27/2020. FINDINGS: The patient's right chest tube has partially backed out. Right apical pneumothorax persists but is smaller in size. The left lung is expanded and clear. Aortic valve replacement noted. Heart size is normal. IMPRESSION: Small right pneumothorax is decreased in size since the most recent exam. Right chest tube has partially backed out. The exam is otherwise unchanged. Electronically Signed   By: Inge Rise M.D.   On: 08/30/2020 13:54   DG Chest 2 View  Result Date: 08/27/2020 CLINICAL DATA:  History of pneumothorax. EXAM: CHEST - 2 VIEW COMPARISON:  08/26/2020. FINDINGS: Postsurgical changes right lung. Right chest tube in stable position. Right apical pneumothorax is  stable. Stable mild right base pleural thickening. Prior cardiac valve replacement. Heart size normal. IMPRESSION: Postsurgical changes right lung. Right chest tube in stable position. Right apical pneumothorax is  stable. 2.  Cardiac valve replacement again noted.  Heart size normal. Electronically Signed   By: Marcello Moores  Register   On: 08/27/2020 07:52   DG Chest 2 View  Result Date: 08/14/2020 CLINICAL DATA:  80 year old female under preoperative evaluation prior to right upper lobectomy. EXAM: CHEST - 2 VIEW COMPARISON:  Chest x-ray 06/21/2020. FINDINGS: Again noted is a mass in the right upper lobe estimated to measure approximately 4.5 cm. Adjacent ill-defined opacities and regions of interstitial prominence are noted in the right upper lobe, likely to reflect evolving postobstructive changes. This is associated with elevation of the minor fissure, indicative of a volume loss. Left lung is clear. No pleural effusions. No pneumothorax. No evidence of pulmonary edema. Heart size is normal. Upper mediastinal contours are within normal limits. Aortic atherosclerosis. Status post TAVR. IMPRESSION: 1. Right upper lobe mass with evolving postobstructive changes, as above. 2. Aortic atherosclerosis. Electronically Signed   By: Vinnie Langton M.D.   On: 08/14/2020 09:35   DG Chest 2V REPEAT Same day  Result Date: 08/30/2020 CLINICAL DATA:  Status post RIGHT lung surgery. RIGHT chest tube removal. EXAM: CHEST - 2 VIEW SAME DAY COMPARISON:  08/30/2020 FINDINGS: A RIGHT thoracostomy tube is been removed. A small RIGHT apical pneumothorax is unchanged (less than 10%). RIGHT thoracic surgical changes again noted. Aortic valve replacement is again identified. No large pleural effusion or airspace disease noted. IMPRESSION: RIGHT thoracostomy tube removal with unchanged small RIGHT apical pneumothorax. Electronically Signed   By: Margarette Canada M.D.   On: 08/30/2020 15:10   DG CHEST PORT 1 VIEW  Result Date: 08/26/2020 CLINICAL DATA:  Chest tube.  Pneumothorax. EXAM: PORTABLE CHEST 1 VIEW COMPARISON:  08/24/2020 FINDINGS: Patient has RIGHT-sided chest tube, unchanged in position. RIGHT apical pneumothorax is slightly smaller,  approximately 10-15% of lung volume. There is minimal atelectasis at the RIGHT lung base. LEFT lung is clear. Heart size is normal. There is atherosclerotic calcification of the thoracic aorta. The IMPRESSION: Smaller RIGHT apical pneumothorax. Electronically Signed   By: Nolon Nations M.D.   On: 08/26/2020 07:58   DG CHEST PORT 1 VIEW  Result Date: 08/24/2020 CLINICAL DATA:  9 Days Post-Op Procedure(s) (LRB):XI ROBOTIC ASSISTED THORASCOPY-RIGHT UPPER LOBECTOMY (Right)INTERCOSTAL NERVE BLOCK RIGHT (Right)NODE DISSECTION (Right) EXAM: PORTABLE CHEST 1 VIEW COMPARISON:  08/23/2020 and older studies. FINDINGS: Stable right sided pneumothorax, 20-30% in size. Right chest tube projecting over the right mid hemithorax is stable. No left pneumothorax. Prominent interstitial markings most evident at the right lung base. Additional hazy opacity at the right lung base is consistent with a small effusion. Cardiac silhouette normal in size.  Stable aortic valve replacement. IMPRESSION: 1. Stable right-sided pneumothorax and chest tube when compared to the previous day's exam. 2. No acute abnormalities. Electronically Signed   By: Lajean Manes M.D.   On: 08/24/2020 08:23   DG CHEST PORT 1 VIEW  Result Date: 08/23/2020 CLINICAL DATA:  Follow-up chest tube and right pneumothorax. EXAM: PORTABLE CHEST 1 VIEW COMPARISON:  08/22/2020 FINDINGS: Persistent right pneumothorax measuring 20-30% in size. Pneumothorax has not significantly changed. Stable appearance of the right pigtail chest tube. Again noted is a small amount of subcutaneous gas in the right chest. Evidence of a TAVR procedure. Few hazy densities at the left lung apex are minimally changed. Heart size is  stable. There is linear density along the lateral left chest at likely represents a Mach line. IMPRESSION: Stable appearance and size of the right pneumothorax. Stable positioning of the right chest tube. Electronically Signed   By: Markus Daft M.D.   On:  08/23/2020 09:27   DG CHEST PORT 1 VIEW  Result Date: 08/22/2020 CLINICAL DATA:  Right pneumothorax. EXAM: PORTABLE CHEST 1 VIEW COMPARISON:  08/21/2020. FINDINGS: Left costophrenic angle incompletely imaged. Interim removal of large bore right chest tube. Small bore right chest tube in stable position. Persistent but slightly improved pneumothorax. Postsurgical changes right chest. No pleural effusion. Prior cardiac valve replacement. Heart size stable. Degenerative change thoracic spine. Mild right chest wall subcutaneous emphysema. IMPRESSION: Interim removal of large bore right chest tube. Small right chest tube in stable position. Persistent but slightly improved right-sided pneumothorax. Persistent mild right chest wall subcutaneous emphysema again noted. Electronically Signed   By: Marcello Moores  Register   On: 08/22/2020 08:29   DG CHEST PORT 1 VIEW  Result Date: 08/21/2020 CLINICAL DATA:  Chest tube.  Right pneumothorax. EXAM: PORTABLE CHEST 1 VIEW COMPARISON:  08/20/2020. FINDINGS: Right chest tubes in stable position. Postsurgical changes right lung with persistent right pneumothorax. Pneumothorax may have increased slightly from prior exam. Left apical pleural thickening consistent scarring again noted. Prior cardiac valve replacement. Heart size stable no pulmonary venous congestion. Mild right chest wall subcutaneous emphysema again noted. IMPRESSION: 1. Two right chest tubes in stable position. Postsurgical changes right lung with persistent right pneumothorax. Pneumothorax may have increased slightly from prior exam. Mild right chest wall subcutaneous emphysema again noted. 2.  Prior cardiac valve replacement.  Heart size stable. Electronically Signed   By: Marcello Moores  Register   On: 08/21/2020 08:27   DG CHEST PORT 1 VIEW  Result Date: 08/20/2020 CLINICAL DATA:  Chest tube.  Pneumothorax. EXAM: PORTABLE CHEST 1 VIEW COMPARISON:  08/19/2020. FINDINGS: Two right chest tubes noted in stable position.  Interim slight progression of right-sided pneumothorax. Postsurgical changes right lung. Mild right base atelectasis. Small right pleural effusion noted on today's exam. Prior cardiac valve replacement. Heart size stable. No acute bony abnormality. IMPRESSION: 1. Two right chest tubes noted stable position. Interim slight progression of right-sided pneumothorax. Postsurgical changes right lung. Mild right base atelectasis. Small right pleural effusion noted on today's exam. 2.  Prior cardiac valve replacement.  Heart size stable. Electronically Signed   By: Marcello Moores  Register   On: 08/20/2020 09:20   DG Chest Port 1 View  Result Date: 08/19/2020 CLINICAL DATA:  Status post RIGHT UPPER lobectomy. EXAM: PORTABLE CHEST 1 VIEW COMPARISON:  08/18/2020 FINDINGS: A tiny UPPER RIGHT pneumothorax is noted with unchanged position of 2 RIGHT thoracostomy tubes. Cardiomediastinal silhouette is unchanged with aortic valve replacement noted. Lung volumes are slightly improved. No large pleural effusion identified. No other significant change. IMPRESSION: Tiny UPPER RIGHT pneumothorax with unchanged RIGHT thoracostomy tubes. No other significant change. Electronically Signed   By: Margarette Canada M.D.   On: 08/19/2020 08:43   DG Chest Port 1 View  Result Date: 08/18/2020 CLINICAL DATA:  Right chest tube placement. EXAM: PORTABLE CHEST 1 VIEW COMPARISON:  August 17, 2020 FINDINGS: Cardiomediastinal silhouette is normal. Mediastinal contours appear intact. Calcific atherosclerotic disease and tortuosity of the aorta. Status post right upper lobectomy. Improvement of right pneumothorax. Two chest tubes in place as before. Osseous structures are without acute abnormality. Right chest wall soft tissue emphysema. IMPRESSION: Status post right upper lobectomy with improved right pneumothorax. Two  right chest tubes in place. Electronically Signed   By: Fidela Salisbury M.D.   On: 08/18/2020 11:34   DG Chest Port 1 View  Result  Date: 08/17/2020 CLINICAL DATA:  Status post lobectomy. EXAM: PORTABLE CHEST 1 VIEW COMPARISON:  08/16/2020 FINDINGS: Right jugular central line tip is in the lower SVC region. Patient has had a TAVR procedure. Again noted are 2 right chest tubes. Possible pleural line in the mid right chest near the pigtail chest tube which is unchanged and there may be a second small pleural line near the right lung apex. No significant lung consolidation. Heart and mediastinum are stable. No evidence for a left pneumothorax. Small amount of subcutaneous gas in the right neck. Again noted are surgical clips in the right lung. IMPRESSION: 1. Stable position of the right chest tubes. 2. Questionable small pleural lines in the right chest and suspect there may be a small complex pneumothorax. No significant change from the recent comparison examination. Electronically Signed   By: Markus Daft M.D.   On: 08/17/2020 09:24   DG CHEST PORT 1 VIEW  Result Date: 08/16/2020 CLINICAL DATA:  Chest tubes in place for pneumothorax EXAM: PORTABLE CHEST 1 VIEW COMPARISON:  August 15, 2020 FINDINGS: Postoperative change noted on the right a large-bore chest tube and a pigtail catheter unchanged in position on the right. Pneumothorax in the postoperative space is again noted, similar to 1 day prior. There is postoperative change on the right with soft tissue fullness in the right perihilar region, stable. No new opacity evident. Left lung clear. Heart is mildly enlarged. Status post aortic valve replacement. There is aortic atherosclerosis. No bone lesions. There is subcutaneous air on the right, stable. IMPRESSION: Postoperative change on the right with fullness in the right perihilar region, stable and potentially representing postoperative hematoma. Previously noted right lobe mass has been removed. Chest tubes on the right again noted with pneumothorax, stable, not extending completely to the apex. No new opacity. Left lung clear. Stable  cardiac silhouette. Status post aortic valve replacement. Aortic Atherosclerosis (ICD10-I70.0). Electronically Signed   By: Lowella Grip III M.D.   On: 08/16/2020 08:07   DG Chest Port 1 View  Result Date: 08/15/2020 CLINICAL DATA:  Postoperative EXAM: PORTABLE CHEST 1 VIEW COMPARISON:  08/13/2020 FINDINGS: Interval placement of a right central venous catheter with tip over the low SVC region. 2 chest drains have been placed. Interval postoperative changes with surgical staples in the right lung consistent with partial resection. The previous right upper lung mass is resected. Moderate pneumothorax in the post resection space. Mild subcutaneous emphysema. Mild cardiac enlargement with previous cardiac valve prosthesis. Calcification of the aorta. IMPRESSION: Postoperative resection of right upper lung with gas in the post pneumonectomy space. Two chest drains and central venous catheter appear in place. Electronically Signed   By: Lucienne Capers M.D.   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 I have touched base with the thoracic oncology navigator to order molecular studies on her surgical samples I plan to see her back in 2 weeks for further follow-up She will likely need adjuvant systemic treatment We will reserve further discussion once I have results back In the meantime, continue supportive care I encouraged the patient to increase oral intake as tolerated  Chronic anticoagulation  She has no bleeding complications after the procedure Her wound is healing well She will continue anticoagulation therapy  COPD GOLD II She has mild cough which is not unexpected after surgery Her examination is benign She is reassured  No orders of the defined types were placed in this encounter.   All questions were  answered. The patient knows to call the clinic with any problems, questions or concerns. The total time spent in the appointment was 30 minutes encounter with patients including review of chart and various tests results, discussions about plan of care and coordination of care plan   Katelyn Lark, MD 09/05/2020 9:35 AM

## 2020-09-05 NOTE — Assessment & Plan Note (Signed)
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

## 2020-09-05 NOTE — Telephone Encounter (Signed)
Marcella Dubs a physical therapist with [facility] called and left a detailed voice message for an OK for the following orders:  1 wk 1 (from last week) 2 wk 1 1 wk 3  To work on balance, transfers, activity tolerance, steps training, varied terrain safety, strength training, bilateral lower extremity strengthening and endurance work, and safe gait for ambulation.  Please advise if OK for orders.  Call back number is [phone] and he verbalized OK to LVM if he is unable to answer.

## 2020-09-05 NOTE — Assessment & Plan Note (Signed)
She has no bleeding complications after the procedure Her wound is healing well She will continue anticoagulation therapy

## 2020-09-05 NOTE — Telephone Encounter (Signed)
I called Katelyn [name] back and left a voice message on his VM per his verbal OK to do so with the message from Judson Roch.

## 2020-09-06 ENCOUNTER — Other Ambulatory Visit: Payer: Self-pay | Admitting: Thoracic Surgery (Cardiothoracic Vascular Surgery)

## 2020-09-06 DIAGNOSIS — I10 Essential (primary) hypertension: Secondary | ICD-10-CM | POA: Diagnosis not present

## 2020-09-06 DIAGNOSIS — I2510 Atherosclerotic heart disease of native coronary artery without angina pectoris: Secondary | ICD-10-CM | POA: Diagnosis not present

## 2020-09-06 DIAGNOSIS — Z483 Aftercare following surgery for neoplasm: Secondary | ICD-10-CM | POA: Diagnosis not present

## 2020-09-06 DIAGNOSIS — C3411 Malignant neoplasm of upper lobe, right bronchus or lung: Secondary | ICD-10-CM | POA: Diagnosis not present

## 2020-09-06 DIAGNOSIS — J439 Emphysema, unspecified: Secondary | ICD-10-CM | POA: Diagnosis not present

## 2020-09-06 DIAGNOSIS — I480 Paroxysmal atrial fibrillation: Secondary | ICD-10-CM | POA: Diagnosis not present

## 2020-09-08 ENCOUNTER — Other Ambulatory Visit (INDEPENDENT_AMBULATORY_CARE_PROVIDER_SITE_OTHER): Payer: Self-pay | Admitting: Internal Medicine

## 2020-09-09 ENCOUNTER — Encounter: Payer: Self-pay | Admitting: *Deleted

## 2020-09-09 ENCOUNTER — Ambulatory Visit
Admission: RE | Admit: 2020-09-09 | Discharge: 2020-09-09 | Disposition: A | Discharge status: Home / Self Care | Payer: Medicare Other | Source: Ambulatory Visit | Attending: Thoracic Surgery (Cardiothoracic Vascular Surgery) | Admitting: Thoracic Surgery (Cardiothoracic Vascular Surgery)

## 2020-09-09 ENCOUNTER — Ambulatory Visit (INDEPENDENT_AMBULATORY_CARE_PROVIDER_SITE_OTHER): Payer: Self-pay | Admitting: Physician Assistant

## 2020-09-09 ENCOUNTER — Other Ambulatory Visit: Payer: Self-pay

## 2020-09-09 VITALS — BP 143/68 | HR 71 | Resp 20 | Ht 70.0 in | Wt 174.6 lb

## 2020-09-09 DIAGNOSIS — J90 Pleural effusion, not elsewhere classified: Secondary | ICD-10-CM | POA: Diagnosis not present

## 2020-09-09 DIAGNOSIS — C3411 Malignant neoplasm of upper lobe, right bronchus or lung: Secondary | ICD-10-CM

## 2020-09-09 DIAGNOSIS — Z902 Acquired absence of lung [part of]: Secondary | ICD-10-CM

## 2020-09-09 NOTE — Patient Instructions (Signed)
Follow up in 3 weeks with a chest x-ray.  Call if shortness of breath worsens.  May leave the dressings off.

## 2020-09-09 NOTE — Progress Notes (Signed)
[address]       [place] [address]             [phone]       HPI:  Katelyn [name] is a 80 year old woman with a history of heavy tobacco abuse, GOLD class II COPD, severe aortic stenosis, status post TAVR, severe thoracic aortic atherosclerotic disease, coronary disease, PAD, stroke, atrial fibrillation, anticoagulation, hypertension, hyperlipidemia, arthritis, and reflux.  She developed a cough in the spring 2022.  She was found to have a 4 cm right upper lobe lung mass.  On PET/CT it was hypermetabolic.  There was some mild to moderately increased activity in hilar and mediastinal lymph nodes many of which were calcified.  Dr. Roxan Hockey did a robotic right upper lobectomy and node dissection on 08/15/2020.  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  She was discharged from the hospital on 08/27/2020 with a chest tube in place for persistent small right apical pneumothorax and return to the office on 7 1 for chest tube removal.  Follow-up chest x-ray done on that same day showed a small right apical pneumothorax.  Katelyn [name] returns today for scheduled follow-up with repeat chest x-ray. She says she is feeling much better overall.  She still has some shortness of breath with activity but feels this is getting better.  She is not requiring any pain medications.     Current Outpatient Medications  Medication Sig Dispense Refill   amiodarone (PACERONE) 200 MG tablet Take 1 tablet by mouth once daily 90 tablet 3   Cholecalciferol (VITAMIN D3) 50 MCG (2000 UT) capsule Take 2,000 Units by mouth daily.     estradiol (ESTRACE) 2 MG tablet Take 1 tablet by mouth once daily 90 tablet 0   ipratropium (ATROVENT) 0.06 % nasal spray USE 1 SPRAY(S) IN EACH NOSTRIL EVERY 6 HOURS AS NEEDED FOR RUNNY NOSE 15 mL 6   meloxicam (MOBIC) 15 MG tablet Take 1 tablet by mouth once daily 30 tablet 3   metoprolol tartrate (LOPRESSOR) 25 MG tablet Take  0.5 tablets (12.5 mg total) by mouth 2 (two) times daily. 60 tablet 3   oxyCODONE (OXY IR/ROXICODONE) 5 MG immediate release tablet Take 1 tablet (5 mg total) by mouth every 4 (four) hours as needed for moderate pain. 30 tablet 0   pantoprazole (PROTONIX) 40 MG tablet TAKE 1 TABLET BY MOUTH ONCE DAILY BEFORE SUPPER 90 tablet 3   rosuvastatin (CRESTOR) 20 MG tablet TAKE 1 TABLET BY MOUTH AT BEDTIME 90 tablet 0   warfarin (COUMADIN) 2.5 MG tablet Take 1 tablet by mouth once daily 90 tablet 0   nitroGLYCERIN (NITROSTAT) 0.4 MG SL tablet Place 1 tablet (0.4 mg total) under the tongue every 5 (five) minutes as needed for chest pain. 25 tablet 12   NP THYROID 90 MG tablet Take 1 tablet by mouth once daily (Patient taking differently: Take 90 mg by mouth daily.) 90 tablet 0   No current facility-administered medications for this visit.    Physical Exam  Vital signs BP 143/68 Pulse 71 Respirations 20 SPO2 92% Heart: Regular rate and rhythm. Chest: Breath sounds are clear to auscultation. Wounds: All of the port incisions and the chest tube exit site are well-healed    Diagnostic Tests:  CLINICAL DATA:  Recent right upper lobectomy.   EXAM: CHEST - 2 VIEW   COMPARISON:  Chest x-ray dated August 30, 2020.  FINDINGS: The heart size and mediastinal contours are within normal limits. Prior TAVR. Right apical pneumothorax is minimally increased in size. New small right pleural effusion. The left lung is clear. No acute osseous abnormality.   IMPRESSION: 1. New small right pleural effusion. 2. Minimally increased right apical pneumothorax.     Electronically Signed   By: Titus Dubin M.D.   On: 09/09/2020 13:59   Impression / Plan: Katelyn [name] is almost 3 weeks post robotic-assisted right upper lobectomy for T2b, N0, stage IIa large cell neuroendocrine tumor.  She was discharged from the hospital with a chest tube in place that was removed on the last visit on 08/30/2020.  Follow-up  chest x-ray on that date showed a small right apical pneumothorax and she was asked to return today for scheduled follow-up.  She feels she is continuing to improve symptomatically.  She does continue to have some shortness of breath with activity.  Chest x-ray shows slight enlargement of the right apical pneumothorax that can well be related to respiratory variation.  She does have a new, small right pleural effusion.  Since this is small on seems to be well-tolerated, I do not think any intervention is needed at this time.  She does need follow-up.  She already has a scheduled appointment with Dr. Roxan Hockey in 3 weeks.  I asked her to contact us to be seen sooner if she develops progressive worsening of shortness of breath.    Antony Odea, PA-C [facility] [phone]

## 2020-09-09 NOTE — Progress Notes (Signed)
I received a message from pathology that Katelyn [name] tissue was sent for molecular and PDL 1 testing to [facility] on 09/05/20.

## 2020-09-10 DIAGNOSIS — I480 Paroxysmal atrial fibrillation: Secondary | ICD-10-CM | POA: Diagnosis not present

## 2020-09-10 DIAGNOSIS — C3411 Malignant neoplasm of upper lobe, right bronchus or lung: Secondary | ICD-10-CM | POA: Diagnosis not present

## 2020-09-10 DIAGNOSIS — I10 Essential (primary) hypertension: Secondary | ICD-10-CM | POA: Diagnosis not present

## 2020-09-10 DIAGNOSIS — J439 Emphysema, unspecified: Secondary | ICD-10-CM | POA: Diagnosis not present

## 2020-09-10 DIAGNOSIS — I2510 Atherosclerotic heart disease of native coronary artery without angina pectoris: Secondary | ICD-10-CM | POA: Diagnosis not present

## 2020-09-10 DIAGNOSIS — Z483 Aftercare following surgery for neoplasm: Secondary | ICD-10-CM | POA: Diagnosis not present

## 2020-09-10 LAB — POCT INR: INR: 2 — AB (ref 0.9–1.1)

## 2020-09-11 ENCOUNTER — Encounter (HOSPITAL_COMMUNITY): Payer: Self-pay

## 2020-09-11 DIAGNOSIS — I10 Essential (primary) hypertension: Secondary | ICD-10-CM | POA: Diagnosis not present

## 2020-09-11 DIAGNOSIS — J439 Emphysema, unspecified: Secondary | ICD-10-CM | POA: Diagnosis not present

## 2020-09-11 DIAGNOSIS — I2510 Atherosclerotic heart disease of native coronary artery without angina pectoris: Secondary | ICD-10-CM | POA: Diagnosis not present

## 2020-09-11 DIAGNOSIS — I480 Paroxysmal atrial fibrillation: Secondary | ICD-10-CM | POA: Diagnosis not present

## 2020-09-11 DIAGNOSIS — C3411 Malignant neoplasm of upper lobe, right bronchus or lung: Secondary | ICD-10-CM | POA: Diagnosis not present

## 2020-09-11 DIAGNOSIS — Z483 Aftercare following surgery for neoplasm: Secondary | ICD-10-CM | POA: Diagnosis not present

## 2020-09-12 DIAGNOSIS — I10 Essential (primary) hypertension: Secondary | ICD-10-CM | POA: Diagnosis not present

## 2020-09-12 DIAGNOSIS — R1319 Other dysphagia: Secondary | ICD-10-CM | POA: Diagnosis not present

## 2020-09-12 DIAGNOSIS — Z483 Aftercare following surgery for neoplasm: Secondary | ICD-10-CM | POA: Diagnosis not present

## 2020-09-12 DIAGNOSIS — E785 Hyperlipidemia, unspecified: Secondary | ICD-10-CM | POA: Diagnosis not present

## 2020-09-12 DIAGNOSIS — I700 Atherosclerosis of aorta: Secondary | ICD-10-CM | POA: Diagnosis not present

## 2020-09-12 DIAGNOSIS — I679 Cerebrovascular disease, unspecified: Secondary | ICD-10-CM | POA: Diagnosis not present

## 2020-09-12 DIAGNOSIS — C3411 Malignant neoplasm of upper lobe, right bronchus or lung: Secondary | ICD-10-CM | POA: Diagnosis not present

## 2020-09-12 DIAGNOSIS — I70203 Unspecified atherosclerosis of native arteries of extremities, bilateral legs: Secondary | ICD-10-CM | POA: Diagnosis not present

## 2020-09-12 DIAGNOSIS — J439 Emphysema, unspecified: Secondary | ICD-10-CM | POA: Diagnosis not present

## 2020-09-12 DIAGNOSIS — I480 Paroxysmal atrial fibrillation: Secondary | ICD-10-CM | POA: Diagnosis not present

## 2020-09-12 DIAGNOSIS — I2510 Atherosclerotic heart disease of native coronary artery without angina pectoris: Secondary | ICD-10-CM | POA: Diagnosis not present

## 2020-09-13 DIAGNOSIS — C3411 Malignant neoplasm of upper lobe, right bronchus or lung: Secondary | ICD-10-CM | POA: Diagnosis not present

## 2020-09-13 DIAGNOSIS — Z483 Aftercare following surgery for neoplasm: Secondary | ICD-10-CM | POA: Diagnosis not present

## 2020-09-13 DIAGNOSIS — J439 Emphysema, unspecified: Secondary | ICD-10-CM | POA: Diagnosis not present

## 2020-09-13 DIAGNOSIS — I10 Essential (primary) hypertension: Secondary | ICD-10-CM | POA: Diagnosis not present

## 2020-09-13 DIAGNOSIS — I2510 Atherosclerotic heart disease of native coronary artery without angina pectoris: Secondary | ICD-10-CM | POA: Diagnosis not present

## 2020-09-13 DIAGNOSIS — I480 Paroxysmal atrial fibrillation: Secondary | ICD-10-CM | POA: Diagnosis not present

## 2020-09-16 ENCOUNTER — Encounter (HOSPITAL_COMMUNITY): Payer: Self-pay | Admitting: *Deleted

## 2020-09-16 ENCOUNTER — Other Ambulatory Visit: Payer: Self-pay

## 2020-09-16 ENCOUNTER — Emergency Department (HOSPITAL_COMMUNITY)
Admission: EM | Admit: 2020-09-16 | Discharge: 2020-09-16 | Disposition: A | Discharge status: Home / Self Care | Payer: Medicare Other | Attending: Emergency Medicine | Admitting: Emergency Medicine

## 2020-09-16 ENCOUNTER — Telehealth (INDEPENDENT_AMBULATORY_CARE_PROVIDER_SITE_OTHER): Payer: Self-pay

## 2020-09-16 ENCOUNTER — Emergency Department (HOSPITAL_COMMUNITY): Payer: Medicare Other

## 2020-09-16 DIAGNOSIS — J439 Emphysema, unspecified: Secondary | ICD-10-CM | POA: Diagnosis not present

## 2020-09-16 DIAGNOSIS — Z87891 Personal history of nicotine dependence: Secondary | ICD-10-CM | POA: Insufficient documentation

## 2020-09-16 DIAGNOSIS — J449 Chronic obstructive pulmonary disease, unspecified: Secondary | ICD-10-CM | POA: Insufficient documentation

## 2020-09-16 DIAGNOSIS — I4891 Unspecified atrial fibrillation: Secondary | ICD-10-CM | POA: Insufficient documentation

## 2020-09-16 DIAGNOSIS — I2510 Atherosclerotic heart disease of native coronary artery without angina pectoris: Secondary | ICD-10-CM | POA: Diagnosis not present

## 2020-09-16 DIAGNOSIS — Z7901 Long term (current) use of anticoagulants: Secondary | ICD-10-CM | POA: Diagnosis not present

## 2020-09-16 DIAGNOSIS — Z79899 Other long term (current) drug therapy: Secondary | ICD-10-CM | POA: Diagnosis not present

## 2020-09-16 DIAGNOSIS — J45909 Unspecified asthma, uncomplicated: Secondary | ICD-10-CM | POA: Diagnosis not present

## 2020-09-16 DIAGNOSIS — C3411 Malignant neoplasm of upper lobe, right bronchus or lung: Secondary | ICD-10-CM | POA: Diagnosis not present

## 2020-09-16 DIAGNOSIS — I10 Essential (primary) hypertension: Secondary | ICD-10-CM | POA: Diagnosis not present

## 2020-09-16 DIAGNOSIS — R0789 Other chest pain: Secondary | ICD-10-CM | POA: Diagnosis not present

## 2020-09-16 DIAGNOSIS — Z483 Aftercare following surgery for neoplasm: Secondary | ICD-10-CM | POA: Diagnosis not present

## 2020-09-16 DIAGNOSIS — J90 Pleural effusion, not elsewhere classified: Secondary | ICD-10-CM | POA: Diagnosis not present

## 2020-09-16 DIAGNOSIS — Z91040 Latex allergy status: Secondary | ICD-10-CM | POA: Diagnosis not present

## 2020-09-16 DIAGNOSIS — Z85118 Personal history of other malignant neoplasm of bronchus and lung: Secondary | ICD-10-CM | POA: Diagnosis not present

## 2020-09-16 DIAGNOSIS — Z955 Presence of coronary angioplasty implant and graft: Secondary | ICD-10-CM | POA: Insufficient documentation

## 2020-09-16 DIAGNOSIS — R079 Chest pain, unspecified: Secondary | ICD-10-CM

## 2020-09-16 DIAGNOSIS — I480 Paroxysmal atrial fibrillation: Secondary | ICD-10-CM | POA: Diagnosis not present

## 2020-09-16 DIAGNOSIS — R0602 Shortness of breath: Secondary | ICD-10-CM | POA: Diagnosis not present

## 2020-09-16 LAB — BASIC METABOLIC PANEL
Anion gap: 10 (ref 5–15)
BUN: 15 mg/dL (ref 8–23)
CO2: 26 mmol/L (ref 22–32)
Calcium: 8.5 mg/dL — ABNORMAL LOW (ref 8.9–10.3)
Chloride: 102 mmol/L (ref 98–111)
Creatinine, Ser: 1.28 mg/dL — ABNORMAL HIGH (ref 0.44–1.00)
GFR, Estimated: 43 mL/min — ABNORMAL LOW (ref >60)
Glucose, Bld: 115 mg/dL — ABNORMAL HIGH (ref 70–99)
Potassium: 4.3 mmol/L (ref 3.5–5.1)
Sodium: 138 mmol/L (ref 135–145)

## 2020-09-16 LAB — CBC
HCT: 45.2 % (ref 36.0–46.0)
Hemoglobin: 14.1 g/dL (ref 12.0–15.0)
MCH: 31.3 pg (ref 26.0–34.0)
MCHC: 31.2 g/dL (ref 30.0–36.0)
MCV: 100.4 fL — ABNORMAL HIGH (ref 80.0–100.0)
Platelets: 146 10*3/uL — ABNORMAL LOW (ref 150–400)
RBC: 4.5 MIL/uL (ref 3.87–5.11)
RDW: 15.3 % (ref 11.5–15.5)
WBC: 7.3 10*3/uL (ref 4.0–10.5)
nRBC: 0 % (ref 0.0–0.2)

## 2020-09-16 LAB — PROTIME-INR
INR: 1.9 — ABNORMAL HIGH (ref 0.8–1.2)
Prothrombin Time: 21.8 seconds — ABNORMAL HIGH (ref 11.4–15.2)

## 2020-09-16 LAB — TROPONIN I (HIGH SENSITIVITY)
Troponin I (High Sensitivity): 18 ng/L — ABNORMAL HIGH (ref <18)
Troponin I (High Sensitivity): 16 ng/L (ref <18)

## 2020-09-16 NOTE — Discharge Instructions (Addendum)
Our INR today is 1.9 Follow up with your oncologist as scheduled.  Return to the ER for new or worsening symptoms. Recheck with your cardiologist as needed.

## 2020-09-16 NOTE — ED Triage Notes (Signed)
C/o chest pain with sob that started at 5am today

## 2020-09-16 NOTE — ED Provider Notes (Signed)
  Face-to-face evaluation   History: Here for Chest pain, she first noticed today and is felt as a heaviness in her central chest.  She also has ongoing mild shortness of breath.  She is recovering from surgery, right upper lung lobectomy.  She denies fever, chills, cough, diaphoresis, nausea or vomiting.  She is taking her usual medications.  Physical exam: Elderly, alert and cooperative.  No dysarthria or aphasia.  No respiratory distress.  She is lying some by recumbent.  Medical screening examination/treatment/procedure(s) were conducted as a shared visit with non-physician practitioner(s) and myself.  I personally evaluated the patient during the encounter    Daleen Bo, MD 09/16/20 [id number]

## 2020-09-16 NOTE — Telephone Encounter (Signed)
Anne Ng from [facility] called and stated that patient is having SOB, heaviness in chest, BP 150/90, and heart rate of 100 with irregular heartbeat.  Patient was put on the phone and she stated that the symptoms began at 5am this morning and her chest feels like there is weight on it like someone is sitting on her chest. I advised for patient to go to [facility] ER and also confirmed with Dr. Anastasio Champion and he agreed for patient to proceed to emergency room and she did not want to go but after some discussion she agreed to go and call her grandson to come get her and take her. Patient verbalized an understanding. Sending as Juluis Rainier.

## 2020-09-16 NOTE — ED Notes (Signed)
Katelyn [name] informed of bp being high.

## 2020-09-16 NOTE — ED Provider Notes (Signed)
[facility] Provider Note   CSN: [id number] Arrival date & time: 09/16/20  1310     History Chief Complaint  Patient presents with   Chest Pain    Katelyn [name] is a 80 y.o. female.  80 year old female with complaint of chest discomfort onset 5am today when she got out of bed to go to the bathroom. Patient states she is short of breath, feels like someone is sitting on her chest. Symptoms are worse with exertion, improve with rest, does not have any discomfort at this time. Patient is on Coumadin for a-fib, reports recent right lung lobectomy for lung cancer, scheduled to see her oncologist in 2 days. Denies nausea, vomiting, leg swelling, diaphoresis. States she had similar discomfort following her lung surgery but not recently until now.       Past Medical History:  Diagnosis Date   Allergy    rhinitis   Aortic stenosis, severe    a. s/p TAVR 08/2014.   Arthritis    spine and various joints   Cancer of upper lobe of right lung ([place]) 09/04/2020   Carotid artery disease ([place])    Cataract    Cerebrovascular disease 12/05/2018   Childhood asthma    Chronic bronchitis ([place])        Claudication ([place])    COPD GOLD II 07/18/2020   Coronary artery disease    a. April 2012 which revealed mid RCA occlusion with collaterals, 50% LAD stenosis, 30% circumflex stenosis, and 40% marginal stenosis   First degree AV block    GERD (gastroesophageal reflux disease)    Heart murmur    Hyperlipidemia    Hypertension    Left bundle branch block    LUMBAR RADICULOPATHY, RIGHT    PAF (paroxysmal atrial fibrillation) (HCC)    Peripheral vascular disease ([place])    a. s/p aortobifem bypass graft surgery and bilateral fundoplasty by [facility].   Renal artery stenosis ([place])    a. renal artery stenosis (>60% L renal artery) by duplex 04/2020.   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 bilateral subclavian stenosis with antegrade flow by duplex 07/2020    Patient Active Problem List   Diagnosis Date Noted   Cancer of upper lobe of right lung ([place]) 09/04/2020   S/P Robotic Assisted Right Video Thoracoscopy with Right Upper Lobectomy 08/15/2020   Status post thoracotomy 08/15/2020   COPD GOLD II 07/18/2020   Mass of upper lobe of right lung 07/11/2020   Thrombocytopenia ([place]) 03/20/2019   Cerebrovascular disease 12/05/2018   Environmental and seasonal allergies 10/13/2017   Gastroesophageal reflux disease 04/13/2017   Esophageal dysphagia 04/13/2017   Chronic anticoagulation 03/10/2017   Current long-term use of postmenopausal hormone replacement therapy 03/10/2017   Non-allergic rhinitis 11/10/2016   Paroxysmal atrial fibrillation ([place]) 02/13/2016   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   Tachycardia-bradycardia syndrome ([place])    Atrial fibrillation with RVR ([place]) 08/29/2014   LBBB (left bundle branch block), chronic 08/29/2014   CAD in native artery    Peripheral vascular disease ([place]) 12/13/2012   Essential hypertension 12/13/2012   Hyperlipidemia with target LDL less than 70 12/13/2012  Atherosclerosis of native arteries of extremity with intermittent claudication ([place]) 08/10/2011    Past Surgical History:  Procedure Laterality Date   AORTO-FEMORAL BYPASS GRAFT Bilateral 06/27/10   BASAL CELL CARCINOMA EXCISION Left    face   CARDIAC CATHETERIZATION N/A 07/25/2014   Procedure: Right/Left Heart Cath and Coronary Angiography;  Surgeon: Troy Sine, MD;  Location: [facility];  Service: Cardiovascular;  Laterality: N/A;   CATARACT EXTRACTION, BILATERAL Bilateral 09/29/2016   COLONOSCOPY  2008   Dr. Laural Golden: normal.    DILATION AND CURETTAGE  OF UTERUS  "2 or 3"   ESOPHAGOGASTRODUODENOSCOPY N/A 08/18/2017   Dr. Gala Romney: Normal-appearing esophagus status post dilation.  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  Small hiatal hernia.   INTERCOSTAL NERVE BLOCK Right 08/15/2020   Procedure: INTERCOSTAL NERVE BLOCK RIGHT;  Surgeon: Melrose Nakayama, MD;  Location: [facility];  Service: Thoracic;  Laterality: Right;   MALONEY DILATION N/A 08/18/2017   Procedure: Venia Minks DILATION;  Surgeon: Daneil Dolin, MD;  [facility];  Service: Endoscopy;  Laterality: N/A;   MULTIPLE TOOTH EXTRACTIONS     NODE DISSECTION Right 08/15/2020   Procedure: NODE DISSECTION;  Surgeon: Melrose Nakayama, MD;  Location: [facility];  Service: Thoracic;  Laterality: Right;   PR VEIN BYPASS GRAFT,AORTO-FEM-POP  06/12/10   TEE WITHOUT CARDIOVERSION N/A 08/31/2014   Procedure: TRANSESOPHAGEAL ECHOCARDIOGRAM (TEE);  Surgeon: Rexene Alberts, MD;  Location: [facility];  Service: Open Heart Surgery;  Laterality: N/A;   TRANSCATHETER AORTIC VALVE REPLACEMENT, TRANSFEMORAL N/A 08/31/2014   Procedure: TRANSCATHETER AORTIC VALVE REPLACEMENT, TRANSFEMORAL approach;  Surgeon: Rexene Alberts, MD;  Location: [facility];  Service: Open Heart Surgery;  Laterality: N/A;   TUBAL LIGATION  1970's   VAGINAL HYSTERECTOMY  1970's   Partial      OB History   No obstetric history on file.     Family History  Problem Relation Age of Onset   Heart disease Mother 41       died young   Varicose Veins Mother    Stroke Father    Hypertension Father    Heart disease Father 57       Aneurysm   Heart attack Father    Arthritis Brother    Stroke Brother    Diabetes Daughter    Arthritis Daughter    Heart attack Brother        unsure   Allergic rhinitis Neg Hx    Angioedema Neg Hx    Asthma Neg Hx    Atopy Neg Hx    Eczema Neg Hx    Immunodeficiency Neg Hx    Urticaria Neg Hx     Social History   Tobacco Use   Smoking status: Former    Packs/day: 2.00    Years: 56.00     Pack years: 112.00    Types: Cigarettes    Start date: 03/02/1952    Quit date: 06/01/2010    Years since quitting: 10.3   Smokeless tobacco: Never  Vaping Use   Vaping Use: Never used  Substance Use Topics   Alcohol use: No   Drug use: No    Home Medications Prior to Admission medications   Medication Sig Start Date End Date Taking? Authorizing Provider  amiodarone (PACERONE) 200 MG tablet Take 1 tablet by mouth once daily 02/20/20  Yes Troy Sine, MD  Cholecalciferol (VITAMIN D3) 50 MCG (2000 UT) capsule Take 2,000 Units by mouth daily.   Yes  [provider]  estradiol (ESTRACE) 2 MG tablet Take 1 tablet by mouth once daily 07/13/20  Yes Gosrani, Nimish C, MD  ipratropium (ATROVENT) 0.06 % nasal spray USE 1 SPRAY(S) IN EACH NOSTRIL EVERY 6 HOURS AS NEEDED FOR RUNNY NOSE Patient taking differently: Place 1 spray into both nostrils every 6 (six) hours as needed for rhinitis. 05/13/20  Yes Doree Albee, MD  meloxicam (MOBIC) 15 MG tablet Take 1 tablet by mouth once daily 05/20/20  Yes Gosrani, Nimish C, MD  metoprolol tartrate (LOPRESSOR) 25 MG tablet Take 0.5 tablets (12.5 mg total) by mouth 2 (two) times daily. 08/27/20  Yes Barrett, Lodema Hong, PA-C  NP THYROID 90 MG tablet Take 1 tablet by mouth once daily Patient taking differently: Take 90 mg by mouth daily. 06/03/20 09/16/20 Yes Gosrani, Nimish C, MD  pantoprazole (PROTONIX) 40 MG tablet TAKE 1 TABLET BY MOUTH ONCE DAILY BEFORE SUPPER Patient taking differently: Take 40 mg by mouth daily. 07/16/20  Yes Aliene Altes S, PA-C  rosuvastatin (CRESTOR) 20 MG tablet TAKE 1 TABLET BY MOUTH AT BEDTIME 09/09/20  Yes Ailene Ards, NP  warfarin (COUMADIN) 2.5 MG tablet Take 1 tablet by mouth once daily 01/26/20  Yes Gosrani, Nimish C, MD  metoprolol succinate (TOPROL-XL) 25 MG 24 hr tablet Take 25 mg by mouth daily. Patient not taking: No sig reported 09/09/20   [provider]  nitroGLYCERIN (NITROSTAT) 0.4 MG SL tablet Place  1 tablet (0.4 mg total) under the tongue every 5 (five) minutes as needed for chest pain. 04/04/20 08/08/20  Troy Sine, MD  oxyCODONE (OXY IR/ROXICODONE) 5 MG immediate release tablet Take 1 tablet (5 mg total) by mouth every 4 (four) hours as needed for moderate pain. Patient not taking: No sig reported 08/27/20   Barrett, Erin R, PA-C    Allergies    Adhesive [tape], Latex, and Tetanus toxoids  Review of Systems   Review of Systems  Constitutional:  Negative for chills, diaphoresis and fever.  Respiratory:  Positive for shortness of breath. Negative for cough and chest tightness.   Cardiovascular:  Negative for chest pain and leg swelling.  Gastrointestinal:  Negative for abdominal pain, nausea and vomiting.  Genitourinary:  Negative for dysuria.  Musculoskeletal:  Negative for arthralgias and myalgias.  Skin:  Negative for rash and wound.  Neurological:  Negative for weakness and numbness.  Hematological:  Negative for adenopathy. Bruises/bleeds easily.  Psychiatric/Behavioral:  Negative for confusion.   All other systems reviewed and are negative.  Physical Exam Updated Vital Signs BP (!) 153/104   Pulse 99   Temp 97.6 F (36.4 C) (Oral)   Resp (!) 28   Ht 5\' 10"  (1.778 m)   Wt 77.6 kg   SpO2 94%   BMI 24.54 kg/m   Physical Exam Vitals and nursing note reviewed.  Constitutional:      General: She is not in acute distress.    Appearance: She is well-developed. She is not diaphoretic.  HENT:     Head: Normocephalic and atraumatic.  Cardiovascular:     Rate and Rhythm: Normal rate and regular rhythm.     Heart sounds: Normal heart sounds.  Pulmonary:     Effort: Pulmonary effort is normal.     Breath sounds: Examination of the right-lower field reveals decreased breath sounds. Decreased breath sounds present.  Chest:     Chest wall: No mass.  Abdominal:     Palpations: Abdomen is soft.  Tenderness: There is no abdominal tenderness.  Musculoskeletal:      Cervical back: Neck supple.     Right lower leg: No tenderness. No edema.     Left lower leg: No tenderness. No edema.  Skin:    General: Skin is warm and dry.     Findings: No erythema or rash.  Neurological:     Mental Status: She is alert and oriented to person, place, and time.  Psychiatric:        Behavior: Behavior normal.    ED Results / Procedures / Treatments   Labs (all labs ordered are listed, but only abnormal results are displayed) Labs Reviewed  BASIC METABOLIC PANEL - Abnormal; Notable for the following components:      Result Value   Glucose, Bld 115 (*)    Creatinine, Ser 1.28 (*)    Calcium 8.5 (*)    GFR, Estimated 43 (*)    All other components within normal limits  CBC - Abnormal; Notable for the following components:   MCV 100.4 (*)    Platelets 146 (*)    All other components within normal limits  PROTIME-INR - Abnormal; Notable for the following components:   Prothrombin Time 21.8 (*)    INR 1.9 (*)    All other components within normal limits  TROPONIN I (HIGH SENSITIVITY) - Abnormal; Notable for the following components:   Troponin I (High Sensitivity) 18 (*)    All other components within normal limits  TROPONIN I (HIGH SENSITIVITY)    EKG EKG Interpretation  Date/Time:  Monday September 16 2020 13:26:19 EDT Ventricular Rate:  108 PR Interval:    QRS Duration: 152 QT Interval:  448 QTC Calculation: 600 R Axis:   -42 Text Interpretation: Wide QRS rhythm with Premature supraventricular complexes Left axis deviation Left bundle branch block Abnormal ECG Since last tracing rate faster Otherwise no significant change Confirmed by Daleen Bo [phone]) on 09/16/2020 5:08:25 PM  Radiology DG Chest 2 View  Result Date: 09/16/2020 CLINICAL DATA:  Onset shortness of breath at 5 a.m. this morning. History of lung cancer and COPD. EXAM: CHEST - 2 VIEW COMPARISON:  PA and lateral chest 09/09/2020. FINDINGS: Postoperative change of right upper lobectomy  again seen. Right pneumothorax seen on the prior examination has resolved with pleural fluid now tracking from the right lung base over the apex. The left lung is expanded and clear. Emphysema noted. Heart size is normal. Aortic valve repair and aortic atherosclerosis noted. No acute or focal bony abnormality. IMPRESSION: Status post right upper lobectomy. Moderate right pleural effusion has slightly increased in size. Right pneumothorax has resolved. Aortic Atherosclerosis (ICD10-I70.0) and Emphysema (ICD10-J43.9). Electronically Signed   By: Inge Rise M.D.   On: 09/16/2020 14:11    Procedures Procedures   Medications Ordered in ED Medications - No data to display  ED Course  I have reviewed the triage vital signs and the nursing notes.  Pertinent labs & imaging results that were available during my care of the patient were reviewed by me and considered in my medical decision making (see chart for details).  Clinical Course as of 09/16/20 1721  Mon Sep 16, 4061  5960 80 year old female with complaint of shortness of breath and chest pressure as above.  On exam, resting comfortably in bed in no distress.  O2 sat 94% on room air. History significant for recent right lobectomy from cancer, scheduled see her cardiologist in 2 days. Chest x-ray shows slightly progressing right pleural  effusion, resolution of right pneumothorax compared to chest x-ray from 1 week ago. EKG with known left bundle branch block, unchanged.  Troponins flat at 18 and 16.  CBC and BMP without significant changes, INR slightly subtherapeutic at 1.9. Is discussed with Dr. Eulis Foster, ER attending who has seen the patient, plan is for patient to follow-up with her oncologist in 2 days as scheduled, follow-up with cardiology if needed and return to ED for worsening or concerning symptoms. [LM]    Clinical Course User Index [LM] Roque Lias   MDM Rules/Calculators/A&P                          Final Clinical  Impression(s) / ED Diagnoses Final diagnoses:  Pleural effusion  Chest pain, unspecified type    Rx / DC Orders ED Discharge Orders     None        Tacy Learn, PA-C 09/16/20 1721    Daleen Bo, MD 09/16/20 2359

## 2020-09-17 ENCOUNTER — Encounter (HOSPITAL_COMMUNITY): Payer: Self-pay

## 2020-09-17 DIAGNOSIS — I10 Essential (primary) hypertension: Secondary | ICD-10-CM | POA: Diagnosis not present

## 2020-09-17 DIAGNOSIS — Z483 Aftercare following surgery for neoplasm: Secondary | ICD-10-CM | POA: Diagnosis not present

## 2020-09-17 DIAGNOSIS — C3411 Malignant neoplasm of upper lobe, right bronchus or lung: Secondary | ICD-10-CM | POA: Diagnosis not present

## 2020-09-17 DIAGNOSIS — I480 Paroxysmal atrial fibrillation: Secondary | ICD-10-CM | POA: Diagnosis not present

## 2020-09-17 DIAGNOSIS — I2510 Atherosclerotic heart disease of native coronary artery without angina pectoris: Secondary | ICD-10-CM | POA: Diagnosis not present

## 2020-09-17 DIAGNOSIS — J439 Emphysema, unspecified: Secondary | ICD-10-CM | POA: Diagnosis not present

## 2020-09-18 ENCOUNTER — Encounter (INDEPENDENT_AMBULATORY_CARE_PROVIDER_SITE_OTHER): Payer: Self-pay | Admitting: Internal Medicine

## 2020-09-18 ENCOUNTER — Encounter (HOSPITAL_COMMUNITY): Payer: Self-pay | Admitting: Hematology and Oncology

## 2020-09-18 ENCOUNTER — Inpatient Hospital Stay (HOSPITAL_BASED_OUTPATIENT_CLINIC_OR_DEPARTMENT_OTHER): Payer: Medicare Other | Admitting: Hematology and Oncology

## 2020-09-18 ENCOUNTER — Other Ambulatory Visit: Payer: Self-pay

## 2020-09-18 ENCOUNTER — Ambulatory Visit (INDEPENDENT_AMBULATORY_CARE_PROVIDER_SITE_OTHER): Payer: Medicare Other | Admitting: Internal Medicine

## 2020-09-18 VITALS — BP 144/78 | HR 88 | Temp 98.0°F | Ht 70.0 in | Wt 172.4 lb

## 2020-09-18 VITALS — BP 156/95 | HR 91 | Wt 172.3 lb

## 2020-09-18 DIAGNOSIS — C7A8 Other malignant neuroendocrine tumors: Secondary | ICD-10-CM | POA: Diagnosis not present

## 2020-09-18 DIAGNOSIS — E2839 Other primary ovarian failure: Secondary | ICD-10-CM | POA: Diagnosis not present

## 2020-09-18 DIAGNOSIS — J90 Pleural effusion, not elsewhere classified: Secondary | ICD-10-CM

## 2020-09-18 DIAGNOSIS — I679 Cerebrovascular disease, unspecified: Secondary | ICD-10-CM | POA: Diagnosis not present

## 2020-09-18 DIAGNOSIS — I10 Essential (primary) hypertension: Secondary | ICD-10-CM | POA: Diagnosis not present

## 2020-09-18 DIAGNOSIS — C3490 Malignant neoplasm of unspecified part of unspecified bronchus or lung: Secondary | ICD-10-CM

## 2020-09-18 DIAGNOSIS — I701 Atherosclerosis of renal artery: Secondary | ICD-10-CM

## 2020-09-18 DIAGNOSIS — C3411 Malignant neoplasm of upper lobe, right bronchus or lung: Secondary | ICD-10-CM

## 2020-09-18 DIAGNOSIS — I350 Nonrheumatic aortic (valve) stenosis: Secondary | ICD-10-CM | POA: Diagnosis not present

## 2020-09-18 DIAGNOSIS — Z902 Acquired absence of lung [part of]: Secondary | ICD-10-CM | POA: Diagnosis not present

## 2020-09-18 DIAGNOSIS — Z952 Presence of prosthetic heart valve: Secondary | ICD-10-CM | POA: Diagnosis not present

## 2020-09-18 NOTE — Assessment & Plan Note (Signed)
She is not fully recovered from recent surgery and remains weak I reviewed her recent chest x-ray which show persistent pleural effusion on the right but resolution of pneumothorax I recommend repeat PET CT scan next month and return visit for further discussion about plan of care I reviewed recent foundation One testing with her and family Tumor mutation burden is high but PD-L1 testing and MSI testing were negative She has multiple different mutations but nothing actionable She is very concerned about side effects of treatment We discussed the risk, benefits, side effects of combination chemotherapy of carboplatin with gemcitabine versus carboplatin with pemetrexed, followed by a years worth of immunotherapy We did not reach to conclusion or decide the next plan of action I recommend we focus on supportive care We will review results of PET CT scan next month and had further discussion about treatment options at that point in time

## 2020-09-18 NOTE — Progress Notes (Signed)
[place] OFFICE PROGRESS NOTE  Patient Care Team: Doree Albee, MD as PCP - General (Internal Medicine) Troy Sine, MD as PCP - Cardiology (Cardiology) Troy Sine, MD as Attending Physician (Cardiology) Daneil Dolin, MD as Consulting Physician (Gastroenterology) Almyra Deforest, [place] as Physician Assistant (Cardiology) Brien Mates, RN as Oncology Nurse Navigator (Oncology)  ASSESSMENT & PLAN:  Cancer of upper lobe of right lung [facility]) She is not fully recovered from recent surgery and remains weak I reviewed her recent chest x-ray which show persistent pleural effusion on the right but resolution of pneumothorax I recommend repeat PET CT scan next month and return visit for further discussion about plan of care I reviewed recent foundation One testing with her and family Tumor mutation burden is high but PD-L1 testing and MSI testing were negative She has multiple different mutations but nothing actionable She is very concerned about side effects of treatment We discussed the risk, benefits, side effects of combination chemotherapy of carboplatin with gemcitabine versus carboplatin with pemetrexed, followed by a years worth of immunotherapy We did not reach to conclusion or decide the next plan of action I recommend we focus on supportive care We will review results of PET CT scan next month and had further discussion about treatment options at that point in time  Pleural effusion on right She has persistent right-sided pleural effusion but denies excessive shortness of breath today Her pneumothorax is resolved We discussed the risk and benefits of therapeutic thoracentesis but at this point in time, I recommend we observe We discussed importance of good nutrition and incentive spirometry  Orders Placed This Encounter  Procedures   NM PET Image Restage (PS) Skull Base to Thigh    Standing Status:   Future    Standing Expiration Date:   09/18/2021     Order Specific Question:   If indicated for the ordered procedure, I authorize the administration of a radiopharmaceutical per Radiology protocol    Answer:   Yes    Order Specific Question:   Preferred imaging location?    Answer:   [facility]    Order Specific Question:   Radiology Contrast Protocol - do NOT remove file path    Answer:   [email or web address]    All questions were answered. The patient knows to call the clinic with any problems, questions or concerns. The total time spent in the appointment was 40 minutes encounter with patients including review of chart and various tests results, discussions about plan of care and coordination of care plan   Heath Lark, MD 09/18/2020 5:14 PM  INTERVAL HISTORY: Please see below for problem oriented charting. She returns with family for further follow-up She went to the emergency department 2 days ago for shortness of breath Her breathing is stable She has intermittent cough No fever or chills Overall, she felt weak She is concerned about side effects of treatment and wondering whether what would happen if she does not undergo further treatment She denies pain  SUMMARY OF ONCOLOGIC HISTORY: Oncology History Overview Note  Foundation One PD-L1 TPS score 1 16 mut/MB [id number] mutation MSI stable   Cancer of upper lobe of right lung ([place])  08/15/2020 Pathology Results   FINAL MICROSCOPIC DIAGNOSIS:   A. 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  - Tumor is limited to lung.  - Margins are negative for carcinoma.  - One of one lymph nodes negative for  carcinoma (0/1).  - See oncology table.   B. LYMPH NODE, 7, EXCISION:  - One of one lymph nodes negative for carcinoma (0/1).   C. LYMPH NODE, 7 #2, EXCISION:  - One of one lymph nodes negative for carcinoma (0/1).   D. LYMPH NODE, 9, EXCISION:  - One of one lymph nodes negative for  carcinoma (0/1).   E. LYMPH NODE, 8, EXCISION:  - One of one lymph nodes negative for carcinoma (0/1).   F. LYMPH NODE, 7 #3, EXCISION:  - One of one lymph nodes negative for carcinoma (0/1).   G. LYMPH NODE, 11, EXCISION:  - One of one lymph nodes negative for carcinoma (0/1).   H. LYMPH NODE, 10, EXCISION:  - One of one lymph nodes negative for carcinoma (0/1).   I. LYMPH NODE, 4R, EXCISION:  - One of one lymph nodes negative for carcinoma (0/1).   J. LYMPH NODE, 10 #2, EXCISION:   - One of one lymph nodes negative for carcinoma (0/1).  - Fibrosis and calcifications.   K. LYMPH NODE, 12, EXCISION:  - One of one lymph nodes negative for carcinoma (0/1).   L. LYMPH NODE, 12 #2, EXCISION:  - One of one lymph nodes negative for carcinoma (0/1).   ONCOLOGY TABLE:   LUNG: Resection   Synchronous Tumors: Not applicable  Total Number of Primary Tumors: 1  Procedure: Right upper lobe lobectomy with lymph node biopsies  Specimen Laterality: Right  Tumor Focality: Unifocal  Tumor Site: Right upper lobe  Tumor Size: 4.4 cm  Histologic Type: Large cell neuroendocrine carcinoma  Visceral Pleura Invasion: Not identified  Direct Invasion of Adjacent Structures: No adjacent structures present  Lymphovascular Invasion: Not identified  Margins: All margins negative for invasive carcinoma       Closest Margin(s) to Invasive Carcinoma: 1.5 cm to bronchial margin  Treatment Effect: No known presurgical therapy  Regional Lymph Nodes:       Number of Lymph Nodes Involved: 0                            Nodal Sites with Tumor: Not applicable       Number of Lymph Nodes Examined: 12                       Nodal Sites Examined: 4, 7, 8, 9, 10, 11, 12  Distant Metastasis:       Distant Site(s) Involved: Not applicable  Pathologic Stage Classification (pTNM, AJCC 8th Edition): pT2b, pN0  Ancillary Studies: Can be performed upon request  Representative Tumor Block: A2  Comment(s): The tumor  has a subtle neuroendocrine features, cribriform areas with necrosis, and geographic areas of necrosis.  Immunohistochemistry is positive for CD56, synaptophysin, and TTF-1.  Chromogranin is largely negative. Ki-67 is elevated. Dr. Saralyn Pilar has reviewed the case.     08/15/2020 Surgery   PREOPERATIVE DIAGNOSIS:  Right upper lobe lung mass, suspected non-small cell carcinoma.   POSTOPERATIVE DIAGNOSIS:  Non-small cell carcinoma, right upper lobe, clinical stage IB (T2, N0).   PROCEDURE:   Xi robotic-assisted right upper lobectomy,  Lymph node dissection,  Intercostal nerve blocks levels 3 through 10.   SURGEON:  Modesto Charon, MD    FINDINGS:  Fragile tissue.  Enlarged but otherwise benign appearing nodes.  One calcified node.  Frozen section revealed non-small cell carcinoma.  Bronchial margin clear.   09/04/2020 Initial Diagnosis   Cancer of  upper lobe of right lung ([place])   09/04/2020 Cancer Staging   Staging form: Lung, AJCC 8th Edition - Pathologic stage from 09/04/2020: pT2, pN0, cM0 - Signed by Heath Lark, MD on 09/04/2020  Stage prefix: Initial diagnosis    09/11/2020 Genetic Testing   PD-L1 Results:       REVIEW OF SYSTEMS:   Constitutional: Denies fevers, chills or abnormal weight loss Eyes: Denies blurriness of vision Ears, nose, mouth, throat, and face: Denies mucositis or sore throat Cardiovascular: Denies palpitation, chest discomfort or lower extremity swelling Gastrointestinal:  Denies nausea, heartburn or change in bowel habits Skin: Denies abnormal skin rashes Lymphatics: Denies new lymphadenopathy or easy bruising Neurological:Denies numbness, tingling or new weaknesses Behavioral/Psych: Mood is stable, no new changes  All other systems were reviewed with the patient and are negative.  I have reviewed the past medical history, past surgical history, social history and family history with the patient and they are unchanged from previous note.  ALLERGIES:   is allergic to adhesive [tape], latex, and tetanus toxoids.  MEDICATIONS:  Current Outpatient Medications  Medication Sig Dispense Refill   amiodarone (PACERONE) 200 MG tablet Take 1 tablet by mouth once daily 90 tablet 3   Cholecalciferol (VITAMIN D3) 50 MCG (2000 UT) capsule Take 2,000 Units by mouth daily.     estradiol (ESTRACE) 2 MG tablet Take 1 tablet by mouth once daily 90 tablet 0   ipratropium (ATROVENT) 0.06 % nasal spray USE 1 SPRAY(S) IN EACH NOSTRIL EVERY 6 HOURS AS NEEDED FOR RUNNY NOSE (Patient taking differently: Place 1 spray into both nostrils every 6 (six) hours as needed for rhinitis.) 15 mL 6   meloxicam (MOBIC) 15 MG tablet Take 1 tablet by mouth once daily 30 tablet 3   metoprolol tartrate (LOPRESSOR) 25 MG tablet Take 0.5 tablets (12.5 mg total) by mouth 2 (two) times daily. 60 tablet 3   nitroGLYCERIN (NITROSTAT) 0.4 MG SL tablet Place 1 tablet (0.4 mg total) under the tongue every 5 (five) minutes as needed for chest pain. 25 tablet 12   NP THYROID 90 MG tablet Take 1 tablet by mouth once daily (Patient taking differently: Take 90 mg by mouth daily.) 90 tablet 0   pantoprazole (PROTONIX) 40 MG tablet TAKE 1 TABLET BY MOUTH ONCE DAILY BEFORE SUPPER (Patient taking differently: Take 40 mg by mouth daily.) 90 tablet 3   rosuvastatin (CRESTOR) 20 MG tablet TAKE 1 TABLET BY MOUTH AT BEDTIME 90 tablet 0   warfarin (COUMADIN) 2.5 MG tablet Take 1 tablet by mouth once daily 90 tablet 0   No current facility-administered medications for this visit.    PHYSICAL EXAMINATION: ECOG PERFORMANCE STATUS: 2 - Symptomatic, <50% confined to bed  Vitals:   09/18/20 1545  BP: (!) 156/95  Pulse: 91  SpO2: 96%   Filed Weights   09/18/20 1545  Weight: 172 lb 4.8 oz (78.2 kg)    GENERAL:alert, no distress and comfortable Musculoskeletal:no cyanosis of digits and no clubbing  NEURO: alert & oriented x 3 with fluent speech, no focal motor/sensory deficits  LABORATORY DATA:  I  have reviewed the data as listed    Component Value Date/Time   NA 138 09/16/2020 1359   NA 137 03/20/2020 1039   K 4.3 09/16/2020 1359   CL 102 09/16/2020 1359   CO2 26 09/16/2020 1359   GLUCOSE 115 (H) 09/16/2020 1359   BUN 15 09/16/2020 1359   BUN 31 (H) 03/20/2020 1039   CREATININE 1.28 (H)  09/16/2020 1359   CREATININE 1.31 (H) 06/26/2020 1040   CALCIUM 8.5 (L) 09/16/2020 1359   PROT 5.4 (L) 08/17/2020 0027   PROT 6.5 11/21/2018 1146   ALBUMIN 2.7 (L) 08/17/2020 0027   ALBUMIN 4.1 11/21/2018 1146   AST 26 08/17/2020 0027   ALT 11 08/17/2020 0027   ALKPHOS 54 08/17/2020 0027   BILITOT 0.7 08/17/2020 0027   BILITOT 0.6 11/21/2018 1146   GFRNONAA 43 (L) 09/16/2020 1359   GFRNONAA 39 (L) 06/26/2020 1040   GFRAA 45 (L) 06/26/2020 1040    No results found for: SPEP, UPEP  Lab Results  Component Value Date   WBC 7.3 09/16/2020   NEUTROABS 6,626 06/26/2020   HGB 14.1 09/16/2020   HCT 45.2 09/16/2020   MCV 100.4 (H) 09/16/2020   PLT 146 (L) 09/16/2020      Chemistry      Component Value Date/Time   NA 138 09/16/2020 1359   NA 137 03/20/2020 1039   K 4.3 09/16/2020 1359   CL 102 09/16/2020 1359   CO2 26 09/16/2020 1359   BUN 15 09/16/2020 1359   BUN 31 (H) 03/20/2020 1039   CREATININE 1.28 (H) 09/16/2020 1359   CREATININE 1.31 (H) 06/26/2020 1040      Component Value Date/Time   CALCIUM 8.5 (L) 09/16/2020 1359   ALKPHOS 54 08/17/2020 0027   AST 26 08/17/2020 0027   ALT 11 08/17/2020 0027   BILITOT 0.7 08/17/2020 0027   BILITOT 0.6 11/21/2018 1146       RADIOGRAPHIC STUDIES: I have reviewed recent chest x-ray with the patient and family I have personally reviewed the radiological images as listed and agreed with the findings in the report. DG Chest 2 View  Result Date: 09/16/2020 CLINICAL DATA:  Onset shortness of breath at 5 a.m. this morning. History of lung cancer and COPD. EXAM: CHEST - 2 VIEW COMPARISON:  PA and lateral chest 09/09/2020. FINDINGS:  Postoperative change of right upper lobectomy again seen. Right pneumothorax seen on the prior examination has resolved with pleural fluid now tracking from the right lung base over the apex. The left lung is expanded and clear. Emphysema noted. Heart size is normal. Aortic valve repair and aortic atherosclerosis noted. No acute or focal bony abnormality. IMPRESSION: Status post right upper lobectomy. Moderate right pleural effusion has slightly increased in size. Right pneumothorax has resolved. Aortic Atherosclerosis (ICD10-I70.0) and Emphysema (ICD10-J43.9). Electronically Signed   By: Inge Rise M.D.   On: 09/16/2020 14:11   DG Chest 2 View  Result Date: 09/09/2020 CLINICAL DATA:  Recent right upper lobectomy. EXAM: CHEST - 2 VIEW COMPARISON:  Chest x-ray dated August 30, 2020. FINDINGS: The heart size and mediastinal contours are within normal limits. Prior TAVR. Right apical pneumothorax is minimally increased in size. New small right pleural effusion. The left lung is clear. No acute osseous abnormality. IMPRESSION: 1. New small right pleural effusion. 2. Minimally increased right apical pneumothorax. Electronically Signed   By: Titus Dubin M.D.   On: 09/09/2020 13:59   DG Chest 2 View  Result Date: 08/30/2020 CLINICAL DATA:  Patient status post right upper lobectomy 08/16/2020. Right pneumothorax with a chest tube in place. EXAM: CHEST - 2 VIEW COMPARISON:  PA and lateral chest 08/27/2020. FINDINGS: The patient's right chest tube has partially backed out. Right apical pneumothorax persists but is smaller in size. The left lung is expanded and clear. Aortic valve replacement noted. Heart size is normal. IMPRESSION: Small right pneumothorax is decreased  in size since the most recent exam. Right chest tube has partially backed out. The exam is otherwise unchanged. Electronically Signed   By: Inge Rise M.D.   On: 08/30/2020 13:54   DG Chest 2 View  Result Date: 08/27/2020 CLINICAL DATA:   History of pneumothorax. EXAM: CHEST - 2 VIEW COMPARISON:  08/26/2020. FINDINGS: Postsurgical changes right lung. Right chest tube in stable position. Right apical pneumothorax is stable. Stable mild right base pleural thickening. Prior cardiac valve replacement. Heart size normal. IMPRESSION: Postsurgical changes right lung. Right chest tube in stable position. Right apical pneumothorax is stable. 2.  Cardiac valve replacement again noted.  Heart size normal. Electronically Signed   By: Marcello Moores  Register   On: 08/27/2020 07:52   DG Chest 2V REPEAT Same day  Result Date: 08/30/2020 CLINICAL DATA:  Status post RIGHT lung surgery. RIGHT chest tube removal. EXAM: CHEST - 2 VIEW SAME DAY COMPARISON:  08/30/2020 FINDINGS: A RIGHT thoracostomy tube is been removed. A small RIGHT apical pneumothorax is unchanged (less than 10%). RIGHT thoracic surgical changes again noted. Aortic valve replacement is again identified. No large pleural effusion or airspace disease noted. IMPRESSION: RIGHT thoracostomy tube removal with unchanged small RIGHT apical pneumothorax. Electronically Signed   By: Margarette Canada M.D.   On: 08/30/2020 15:10   DG CHEST PORT 1 VIEW  Result Date: 08/26/2020 CLINICAL DATA:  Chest tube.  Pneumothorax. EXAM: PORTABLE CHEST 1 VIEW COMPARISON:  08/24/2020 FINDINGS: Patient has RIGHT-sided chest tube, unchanged in position. RIGHT apical pneumothorax is slightly smaller, approximately 10-15% of lung volume. There is minimal atelectasis at the RIGHT lung base. LEFT lung is clear. Heart size is normal. There is atherosclerotic calcification of the thoracic aorta. The IMPRESSION: Smaller RIGHT apical pneumothorax. Electronically Signed   By: Nolon Nations M.D.   On: 08/26/2020 07:58   DG CHEST PORT 1 VIEW  Result Date: 08/24/2020 CLINICAL DATA:  9 Days Post-Op Procedure(s) (LRB):XI ROBOTIC ASSISTED THORASCOPY-RIGHT UPPER LOBECTOMY (Right)INTERCOSTAL NERVE BLOCK RIGHT (Right)NODE DISSECTION (Right) EXAM:  PORTABLE CHEST 1 VIEW COMPARISON:  08/23/2020 and older studies. FINDINGS: Stable right sided pneumothorax, 20-30% in size. Right chest tube projecting over the right mid hemithorax is stable. No left pneumothorax. Prominent interstitial markings most evident at the right lung base. Additional hazy opacity at the right lung base is consistent with a small effusion. Cardiac silhouette normal in size.  Stable aortic valve replacement. IMPRESSION: 1. Stable right-sided pneumothorax and chest tube when compared to the previous day's exam. 2. No acute abnormalities. Electronically Signed   By: Lajean Manes M.D.   On: 08/24/2020 08:23   DG CHEST PORT 1 VIEW  Result Date: 08/23/2020 CLINICAL DATA:  Follow-up chest tube and right pneumothorax. EXAM: PORTABLE CHEST 1 VIEW COMPARISON:  08/22/2020 FINDINGS: Persistent right pneumothorax measuring 20-30% in size. Pneumothorax has not significantly changed. Stable appearance of the right pigtail chest tube. Again noted is a small amount of subcutaneous gas in the right chest. Evidence of a TAVR procedure. Few hazy densities at the left lung apex are minimally changed. Heart size is stable. There is linear density along the lateral left chest at likely represents a Mach line. IMPRESSION: Stable appearance and size of the right pneumothorax. Stable positioning of the right chest tube. Electronically Signed   By: Markus Daft M.D.   On: 08/23/2020 09:27   DG CHEST PORT 1 VIEW  Result Date: 08/22/2020 CLINICAL DATA:  Right pneumothorax. EXAM: PORTABLE CHEST 1 VIEW COMPARISON:  08/21/2020. FINDINGS: Left  costophrenic angle incompletely imaged. Interim removal of large bore right chest tube. Small bore right chest tube in stable position. Persistent but slightly improved pneumothorax. Postsurgical changes right chest. No pleural effusion. Prior cardiac valve replacement. Heart size stable. Degenerative change thoracic spine. Mild right chest wall subcutaneous emphysema.  IMPRESSION: Interim removal of large bore right chest tube. Small right chest tube in stable position. Persistent but slightly improved right-sided pneumothorax. Persistent mild right chest wall subcutaneous emphysema again noted. Electronically Signed   By: Marcello Moores  Register   On: 08/22/2020 08:29   DG CHEST PORT 1 VIEW  Result Date: 08/21/2020 CLINICAL DATA:  Chest tube.  Right pneumothorax. EXAM: PORTABLE CHEST 1 VIEW COMPARISON:  08/20/2020. FINDINGS: Right chest tubes in stable position. Postsurgical changes right lung with persistent right pneumothorax. Pneumothorax may have increased slightly from prior exam. Left apical pleural thickening consistent scarring again noted. Prior cardiac valve replacement. Heart size stable no pulmonary venous congestion. Mild right chest wall subcutaneous emphysema again noted. IMPRESSION: 1. Two right chest tubes in stable position. Postsurgical changes right lung with persistent right pneumothorax. Pneumothorax may have increased slightly from prior exam. Mild right chest wall subcutaneous emphysema again noted. 2.  Prior cardiac valve replacement.  Heart size stable. Electronically Signed   By: Marcello Moores  Register   On: 08/21/2020 08:27   DG CHEST PORT 1 VIEW  Result Date: 08/20/2020 CLINICAL DATA:  Chest tube.  Pneumothorax. EXAM: PORTABLE CHEST 1 VIEW COMPARISON:  08/19/2020. FINDINGS: Two right chest tubes noted in stable position. Interim slight progression of right-sided pneumothorax. Postsurgical changes right lung. Mild right base atelectasis. Small right pleural effusion noted on today's exam. Prior cardiac valve replacement. Heart size stable. No acute bony abnormality. IMPRESSION: 1. Two right chest tubes noted stable position. Interim slight progression of right-sided pneumothorax. Postsurgical changes right lung. Mild right base atelectasis. Small right pleural effusion noted on today's exam. 2.  Prior cardiac valve replacement.  Heart size stable.  Electronically Signed   By: Marcello Moores  Register   On: 08/20/2020 09:20

## 2020-09-18 NOTE — Progress Notes (Signed)
Metrics: Intervention Frequency ACO  Documented Smoking Status Yearly  Screened one or more times in 24 months  Cessation Counseling or  Active cessation medication Past 24 months  Past 24 months   Guideline developer: UpToDate (See UpToDate for funding source) Date Released: 2014       Wellness Office Visit  Subjective:  Patient ID: Katelyn [name], female    DOB: 02/24/1941  Age: 80 y.o. MRN: [id number]  CC: This lady comes in for follow-up of hypertension, bioidentical hormone therapy, thyroid therapy. 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  She is due to see oncology today for further recommendations regarding treatment. She has no specific complaints today. Past Medical History:  Diagnosis Date   Allergy    rhinitis   Aortic stenosis, severe    a. s/p TAVR 08/2014.   Arthritis    spine and various joints   Cancer of upper lobe of right lung ([place]) 09/04/2020   Carotid artery disease ([place])    Cataract    Cerebrovascular disease 12/05/2018   Childhood asthma    Chronic bronchitis ([place])        Claudication ([place])    COPD GOLD II 07/18/2020   Coronary artery disease    a. April 2012 which revealed mid RCA occlusion with collaterals, 50% LAD stenosis, 30% circumflex stenosis, and 40% marginal stenosis   First degree AV block    GERD (gastroesophageal reflux disease)    Heart murmur    Hyperlipidemia    Hypertension    Left bundle branch block    LUMBAR RADICULOPATHY, RIGHT    PAF (paroxysmal atrial fibrillation) (HCC)    Peripheral vascular disease ([place])    a. s/p aortobifem bypass graft surgery and bilateral fundoplasty by [facility].   Renal artery stenosis ([place])    a. renal artery stenosis (>60% L renal artery) by duplex 04/2020.   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 bilateral subclavian stenosis with antegrade flow by duplex 07/2020   Past Surgical History:  Procedure Laterality Date   AORTO-FEMORAL BYPASS GRAFT Bilateral 06/27/10   BASAL CELL CARCINOMA EXCISION Left    face   CARDIAC CATHETERIZATION N/A 07/25/2014   Procedure: Right/Left Heart Cath and Coronary Angiography;  Surgeon: Troy Sine, MD;  Location: [facility];  Service: Cardiovascular;  Laterality: N/A;   CATARACT EXTRACTION, BILATERAL Bilateral 09/29/2016   COLONOSCOPY  2008   Dr. Laural Golden: normal.    DILATION AND CURETTAGE OF UTERUS  "2 or 3"   ESOPHAGOGASTRODUODENOSCOPY N/A 08/18/2017   Dr. Gala Romney: Normal-appearing esophagus status post dilation.  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  Small hiatal hernia.   INTERCOSTAL NERVE BLOCK Right 08/15/2020   Procedure: INTERCOSTAL NERVE BLOCK RIGHT;  Surgeon: Melrose Nakayama, MD;  Location: [facility];  Service: Thoracic;  Laterality: Right;   MALONEY DILATION N/A 08/18/2017   Procedure: Venia Minks DILATION;  Surgeon: Daneil Dolin, MD;  [facility];  Service: Endoscopy;  Laterality: N/A;   MULTIPLE TOOTH EXTRACTIONS     NODE DISSECTION Right 08/15/2020   Procedure: NODE DISSECTION;  Surgeon: Melrose Nakayama,  MD;  Location: [facility];  Service: Thoracic;  Laterality: Right;   PR VEIN BYPASS GRAFT,AORTO-FEM-POP  06/12/10   TEE WITHOUT CARDIOVERSION N/A 08/31/2014   Procedure: TRANSESOPHAGEAL ECHOCARDIOGRAM (TEE);  Surgeon: Rexene Alberts, MD;  Location: [facility];  Service: Open Heart Surgery;  Laterality: N/A;   TRANSCATHETER AORTIC VALVE REPLACEMENT, TRANSFEMORAL N/A 08/31/2014   Procedure: TRANSCATHETER AORTIC VALVE REPLACEMENT, TRANSFEMORAL approach;  Surgeon: Rexene Alberts, MD;  Location: [facility];  Service: Open Heart Surgery;  Laterality: N/A;   TUBAL LIGATION  1970's    VAGINAL HYSTERECTOMY  1970's   Partial      Family History  Problem Relation Age of Onset   Heart disease Mother 14       died young   Varicose Veins Mother    Stroke Father    Hypertension Father    Heart disease Father 65       Aneurysm   Heart attack Father    Arthritis Brother    Stroke Brother    Diabetes Daughter    Arthritis Daughter    Heart attack Brother        unsure   Allergic rhinitis Neg Hx    Angioedema Neg Hx    Asthma Neg Hx    Atopy Neg Hx    Eczema Neg Hx    Immunodeficiency Neg Hx    Urticaria Neg Hx     Social History   Social History Narrative   [place] husband in November 2020. Retired,used to work at [employer].Lives alone.         Patient has one daughter, 3 grands   Likes to crochet   Works in [employer].    Social History   Tobacco Use   Smoking status: Former    Packs/day: 2.00    Years: 56.00    Pack years: 112.00    Types: Cigarettes    Start date: 03/02/1952    Quit date: 06/01/2010    Years since quitting: 10.3   Smokeless tobacco: Never  Substance Use Topics   Alcohol use: No    Current Meds  Medication Sig   amiodarone (PACERONE) 200 MG tablet Take 1 tablet by mouth once daily   Cholecalciferol (VITAMIN D3) 50 MCG (2000 UT) capsule Take 2,000 Units by mouth daily.   estradiol (ESTRACE) 2 MG tablet Take 1 tablet by mouth once daily   ipratropium (ATROVENT) 0.06 % nasal spray USE 1 SPRAY(S) IN EACH NOSTRIL EVERY 6 HOURS AS NEEDED FOR RUNNY NOSE (Patient taking differently: Place 1 spray into both nostrils every 6 (six) hours as needed for rhinitis.)   meloxicam (MOBIC) 15 MG tablet Take 1 tablet by mouth once daily   metoprolol tartrate (LOPRESSOR) 25 MG tablet Take 0.5 tablets (12.5 mg total) by mouth 2 (two) times daily.   nitroGLYCERIN (NITROSTAT) 0.4 MG SL tablet Place 1 tablet (0.4 mg total) under the tongue every 5 (five) minutes as needed for chest pain.   NP THYROID 90 MG tablet Take 1 tablet by mouth once  daily (Patient taking differently: Take 90 mg by mouth daily.)   pantoprazole (PROTONIX) 40 MG tablet TAKE 1 TABLET BY MOUTH ONCE DAILY BEFORE SUPPER (Patient taking differently: Take 40 mg by mouth daily.)   rosuvastatin (CRESTOR) 20 MG tablet TAKE 1 TABLET BY MOUTH AT BEDTIME   warfarin (COUMADIN) 2.5 MG tablet Take 1 tablet by mouth once daily     [facility] Office Visit from 09/18/2020 in [facility]  PHQ-9 Total Score 2       Objective:   Today's Vitals: BP (!) 144/78 (BP Location: Left Arm, Patient Position: Sitting, Cuff Size: Large)   Pulse 88   Temp 98 F (36.7 C) (Temporal)   Ht 5\' 10"  (1.778 m)   Wt 172 lb 6.4 oz (78.2 kg)   SpO2 93%   BMI 24.74 kg/m  Vitals with BMI 09/18/2020 09/16/2020 09/16/2020  Height 5\' 10"  - -  Weight 172 lbs 6 oz - -  BMI 06.00 - -  Systolic 459 977 -  Diastolic 78 414 -  Pulse 88 99 48     Physical Exam  Vital signs are stable.     Assessment   1. Essential hypertension   2. Primary ovarian failure   3. Primary malignant neuroendocrine tumor of lung ([place])       Tests ordered No orders of the defined types were placed in this encounter.    Plan: 1.  Continue with metoprolol for hypertension. 2.  Continue with NP thyroid. 3.  Continue with estradiol. 4.  I will see her in January for follow-up.    No orders of the defined types were placed in this encounter.   Doree Albee, MD

## 2020-09-18 NOTE — Assessment & Plan Note (Signed)
She has persistent right-sided pleural effusion but denies excessive shortness of breath today Her pneumothorax is resolved We discussed the risk and benefits of therapeutic thoracentesis but at this point in time, I recommend we observe We discussed importance of good nutrition and incentive spirometry

## 2020-09-19 ENCOUNTER — Encounter (INDEPENDENT_AMBULATORY_CARE_PROVIDER_SITE_OTHER): Payer: Self-pay

## 2020-09-19 DIAGNOSIS — Z483 Aftercare following surgery for neoplasm: Secondary | ICD-10-CM | POA: Diagnosis not present

## 2020-09-19 DIAGNOSIS — J439 Emphysema, unspecified: Secondary | ICD-10-CM | POA: Diagnosis not present

## 2020-09-19 DIAGNOSIS — I10 Essential (primary) hypertension: Secondary | ICD-10-CM | POA: Diagnosis not present

## 2020-09-19 DIAGNOSIS — C3411 Malignant neoplasm of upper lobe, right bronchus or lung: Secondary | ICD-10-CM | POA: Diagnosis not present

## 2020-09-19 DIAGNOSIS — I480 Paroxysmal atrial fibrillation: Secondary | ICD-10-CM | POA: Diagnosis not present

## 2020-09-19 DIAGNOSIS — I2510 Atherosclerotic heart disease of native coronary artery without angina pectoris: Secondary | ICD-10-CM | POA: Diagnosis not present

## 2020-09-20 ENCOUNTER — Other Ambulatory Visit (INDEPENDENT_AMBULATORY_CARE_PROVIDER_SITE_OTHER): Payer: Self-pay | Admitting: Internal Medicine

## 2020-09-24 DIAGNOSIS — Z483 Aftercare following surgery for neoplasm: Secondary | ICD-10-CM | POA: Diagnosis not present

## 2020-09-24 DIAGNOSIS — I2510 Atherosclerotic heart disease of native coronary artery without angina pectoris: Secondary | ICD-10-CM | POA: Diagnosis not present

## 2020-09-24 DIAGNOSIS — J439 Emphysema, unspecified: Secondary | ICD-10-CM | POA: Diagnosis not present

## 2020-09-24 DIAGNOSIS — C3411 Malignant neoplasm of upper lobe, right bronchus or lung: Secondary | ICD-10-CM | POA: Diagnosis not present

## 2020-09-24 DIAGNOSIS — I10 Essential (primary) hypertension: Secondary | ICD-10-CM | POA: Diagnosis not present

## 2020-09-24 DIAGNOSIS — I480 Paroxysmal atrial fibrillation: Secondary | ICD-10-CM | POA: Diagnosis not present

## 2020-09-25 ENCOUNTER — Ambulatory Visit (INDEPENDENT_AMBULATORY_CARE_PROVIDER_SITE_OTHER): Payer: Self-pay | Admitting: Nurse Practitioner

## 2020-09-25 ENCOUNTER — Telehealth (INDEPENDENT_AMBULATORY_CARE_PROVIDER_SITE_OTHER): Payer: Self-pay | Admitting: Nurse Practitioner

## 2020-09-25 LAB — POCT INR: INR: 4 — AB (ref 2–3)

## 2020-09-25 NOTE — Telephone Encounter (Signed)
Called patient and gave her the message. Patient denies having any of these symptoms and she did state that she has a scratch on her arm that is under the skin but no visible bleeding. Patient was given the number and verbalized an understanding and thanked [place].

## 2020-09-25 NOTE — Telephone Encounter (Signed)
Please call patient let her know that I see her INR is above goal at 4.0.  I recommend that she hold 1 dose of her warfarin, then restart her normal dose and recheck INR again next week.  Please let me know if she has any questions and let her know that if she has any signs of bleeding such as bright red blood in her stool, dark tarry stools, nosebleed that will not stop, bleeding from the gums that will not stop, etc that she needs to notify our office.  Unfortunately we will not have a provider available from Friday through Tuesday next week so this occurs during that timeframe she will need to call the on-call number which should be [phone], she should press 0 and ask the operator to connect her to the on-call provider for Dr. Anastasio Champion.  Thank you.

## 2020-09-26 DIAGNOSIS — I480 Paroxysmal atrial fibrillation: Secondary | ICD-10-CM | POA: Diagnosis not present

## 2020-09-26 DIAGNOSIS — I10 Essential (primary) hypertension: Secondary | ICD-10-CM | POA: Diagnosis not present

## 2020-09-26 DIAGNOSIS — J439 Emphysema, unspecified: Secondary | ICD-10-CM | POA: Diagnosis not present

## 2020-09-26 DIAGNOSIS — C3411 Malignant neoplasm of upper lobe, right bronchus or lung: Secondary | ICD-10-CM | POA: Diagnosis not present

## 2020-09-26 DIAGNOSIS — I2510 Atherosclerotic heart disease of native coronary artery without angina pectoris: Secondary | ICD-10-CM | POA: Diagnosis not present

## 2020-09-26 DIAGNOSIS — Z483 Aftercare following surgery for neoplasm: Secondary | ICD-10-CM | POA: Diagnosis not present

## 2020-09-27 DIAGNOSIS — I447 Left bundle-branch block, unspecified: Secondary | ICD-10-CM | POA: Diagnosis not present

## 2020-09-27 DIAGNOSIS — I495 Sick sinus syndrome: Secondary | ICD-10-CM | POA: Diagnosis not present

## 2020-09-27 DIAGNOSIS — Z791 Long term (current) use of non-steroidal anti-inflammatories (NSAID): Secondary | ICD-10-CM | POA: Diagnosis not present

## 2020-09-27 DIAGNOSIS — Z7901 Long term (current) use of anticoagulants: Secondary | ICD-10-CM | POA: Diagnosis not present

## 2020-09-27 DIAGNOSIS — I70203 Unspecified atherosclerosis of native arteries of extremities, bilateral legs: Secondary | ICD-10-CM | POA: Diagnosis not present

## 2020-09-27 DIAGNOSIS — I679 Cerebrovascular disease, unspecified: Secondary | ICD-10-CM | POA: Diagnosis not present

## 2020-09-27 DIAGNOSIS — Z87891 Personal history of nicotine dependence: Secondary | ICD-10-CM | POA: Diagnosis not present

## 2020-09-27 DIAGNOSIS — J439 Emphysema, unspecified: Secondary | ICD-10-CM | POA: Diagnosis not present

## 2020-09-27 DIAGNOSIS — R1319 Other dysphagia: Secondary | ICD-10-CM | POA: Diagnosis not present

## 2020-09-27 DIAGNOSIS — Z952 Presence of prosthetic heart valve: Secondary | ICD-10-CM | POA: Diagnosis not present

## 2020-09-27 DIAGNOSIS — M1990 Unspecified osteoarthritis, unspecified site: Secondary | ICD-10-CM | POA: Diagnosis not present

## 2020-09-27 DIAGNOSIS — E785 Hyperlipidemia, unspecified: Secondary | ICD-10-CM | POA: Diagnosis not present

## 2020-09-27 DIAGNOSIS — R634 Abnormal weight loss: Secondary | ICD-10-CM | POA: Diagnosis not present

## 2020-09-27 DIAGNOSIS — I700 Atherosclerosis of aorta: Secondary | ICD-10-CM | POA: Diagnosis not present

## 2020-09-27 DIAGNOSIS — D696 Thrombocytopenia, unspecified: Secondary | ICD-10-CM | POA: Diagnosis not present

## 2020-09-27 DIAGNOSIS — Z483 Aftercare following surgery for neoplasm: Secondary | ICD-10-CM | POA: Diagnosis not present

## 2020-09-27 DIAGNOSIS — I2510 Atherosclerotic heart disease of native coronary artery without angina pectoris: Secondary | ICD-10-CM | POA: Diagnosis not present

## 2020-09-27 DIAGNOSIS — Z79891 Long term (current) use of opiate analgesic: Secondary | ICD-10-CM | POA: Diagnosis not present

## 2020-09-27 DIAGNOSIS — K219 Gastro-esophageal reflux disease without esophagitis: Secondary | ICD-10-CM | POA: Diagnosis not present

## 2020-09-27 DIAGNOSIS — I10 Essential (primary) hypertension: Secondary | ICD-10-CM | POA: Diagnosis not present

## 2020-09-27 DIAGNOSIS — I480 Paroxysmal atrial fibrillation: Secondary | ICD-10-CM | POA: Diagnosis not present

## 2020-09-27 DIAGNOSIS — C3411 Malignant neoplasm of upper lobe, right bronchus or lung: Secondary | ICD-10-CM | POA: Diagnosis not present

## 2020-09-30 ENCOUNTER — Other Ambulatory Visit: Payer: Self-pay | Admitting: Thoracic Surgery (Cardiothoracic Vascular Surgery)

## 2020-09-30 DIAGNOSIS — Z7901 Long term (current) use of anticoagulants: Secondary | ICD-10-CM | POA: Diagnosis not present

## 2020-09-30 DIAGNOSIS — C3411 Malignant neoplasm of upper lobe, right bronchus or lung: Secondary | ICD-10-CM

## 2020-09-30 LAB — POCT INR: INR: 3.4 — AB (ref 2.0–3.0)

## 2020-10-01 ENCOUNTER — Encounter (HOSPITAL_COMMUNITY)
Admission: RE | Admit: 2020-10-01 | Discharge: 2020-10-01 | Disposition: A | Discharge status: Home / Self Care | Payer: Medicare Other | Source: Ambulatory Visit | Attending: Hematology and Oncology | Admitting: Hematology and Oncology

## 2020-10-01 ENCOUNTER — Other Ambulatory Visit: Payer: Self-pay

## 2020-10-01 ENCOUNTER — Ambulatory Visit
Admission: RE | Admit: 2020-10-01 | Discharge: 2020-10-01 | Disposition: A | Discharge status: Home / Self Care | Payer: Medicare Other | Source: Ambulatory Visit | Attending: Thoracic Surgery (Cardiothoracic Vascular Surgery) | Admitting: Thoracic Surgery (Cardiothoracic Vascular Surgery)

## 2020-10-01 ENCOUNTER — Ambulatory Visit (INDEPENDENT_AMBULATORY_CARE_PROVIDER_SITE_OTHER): Payer: Self-pay | Admitting: Thoracic Surgery (Cardiothoracic Vascular Surgery)

## 2020-10-01 ENCOUNTER — Other Ambulatory Visit: Payer: Self-pay | Admitting: Thoracic Surgery (Cardiothoracic Vascular Surgery)

## 2020-10-01 VITALS — BP 180/80 | HR 62 | Resp 20 | Ht 70.0 in | Wt 167.0 lb

## 2020-10-01 DIAGNOSIS — Z79899 Other long term (current) drug therapy: Secondary | ICD-10-CM | POA: Diagnosis not present

## 2020-10-01 DIAGNOSIS — J439 Emphysema, unspecified: Secondary | ICD-10-CM | POA: Diagnosis not present

## 2020-10-01 DIAGNOSIS — C3490 Malignant neoplasm of unspecified part of unspecified bronchus or lung: Secondary | ICD-10-CM | POA: Insufficient documentation

## 2020-10-01 DIAGNOSIS — C3411 Malignant neoplasm of upper lobe, right bronchus or lung: Secondary | ICD-10-CM | POA: Diagnosis not present

## 2020-10-01 DIAGNOSIS — Z902 Acquired absence of lung [part of]: Secondary | ICD-10-CM

## 2020-10-01 DIAGNOSIS — I700 Atherosclerosis of aorta: Secondary | ICD-10-CM | POA: Diagnosis not present

## 2020-10-01 DIAGNOSIS — J939 Pneumothorax, unspecified: Secondary | ICD-10-CM

## 2020-10-01 DIAGNOSIS — J90 Pleural effusion, not elsewhere classified: Secondary | ICD-10-CM | POA: Diagnosis not present

## 2020-10-01 LAB — GLUCOSE, CAPILLARY: Glucose-Capillary: 115 mg/dL — ABNORMAL HIGH (ref 70–99)

## 2020-10-01 MED ORDER — FLUDEOXYGLUCOSE F - 18 (FDG) INJECTION
8.5000 | Freq: Once | INTRAVENOUS | Status: AC
  Administered 2020-10-01: 9.1 via INTRAVENOUS

## 2020-10-01 NOTE — Progress Notes (Signed)
[address]       [place] [address]             [phone]     HPI: Mrs. Womac returns for a scheduled follow-up visit after recent right upper lobectomy  Marthena Whitmyer is a 80 year old woman with a complex medical history including heavy tobacco abuse, COPD, aortic stenosis, TAVR, severe thoracic aortic atherosclerotic disease, coronary disease, PAD, stroke, atrial fibrillation, anticoagulation, hypertension, hyperlipidemia, arthritis, and reflux.  She presented with a cough.  She was found to have a 4 cm right upper lobe lung mass.  The mass was hypermetabolic on PET/CT.  I did a robotic right upper lobectomy and node dissection on 08/15/2020.  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  Initially she was having a lot of issues with weakness and she also had an air leak.  She went home with a tube in place which we removed on 08/30/2020.  She saw Dr. Alvy Bimler.  She recommended chemotherapy but Mrs. Guion is trying to decide whether she wants to do that or not.  She says she has been slowly improving.  She complains of nausea, mostly when she first gets up in the morning.  She is not taking any pain medication.  Past Medical History:  Diagnosis Date   Allergy    rhinitis   Aortic stenosis, severe    a. s/p TAVR 08/2014.   Arthritis    spine and various joints   Cancer of upper lobe of right lung ([place]) 09/04/2020   Carotid artery disease ([place])    Cataract    Cerebrovascular disease 12/05/2018   Childhood asthma    Chronic bronchitis ([place])        Claudication ([place])    COPD GOLD II 07/18/2020   Coronary artery disease    a. April 2012 which revealed mid RCA occlusion with collaterals, 50% LAD stenosis, 30% circumflex stenosis, and 40% marginal stenosis   First degree AV block    GERD (gastroesophageal reflux disease)    Heart murmur    Hyperlipidemia    Hypertension    Left bundle branch block    LUMBAR RADICULOPATHY, RIGHT     PAF (paroxysmal atrial fibrillation) (HCC)    Peripheral vascular disease ([place])    a. s/p aortobifem bypass graft surgery and bilateral fundoplasty by [facility].   Renal artery stenosis ([place])    a. renal artery stenosis (>60% L renal artery) by duplex 04/2020.   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 bilateral subclavian stenosis with antegrade flow by duplex 07/2020    Current Outpatient Medications  Medication Sig Dispense Refill   amiodarone (PACERONE) 200 MG tablet Take 1 tablet by mouth once daily 90 tablet 3   Cholecalciferol (VITAMIN D3) 50 MCG (2000 UT) capsule Take 2,000 Units by mouth daily.     estradiol (ESTRACE) 2 MG tablet Take 1 tablet by mouth once daily 90 tablet 0   ipratropium (ATROVENT) 0.06 % nasal spray USE 1 SPRAY(S) IN EACH NOSTRIL EVERY 6 HOURS AS NEEDED FOR RUNNY NOSE (Patient taking differently: Place 1 spray into both nostrils every 6 (six) hours  as needed for rhinitis.) 15 mL 6   meloxicam (MOBIC) 15 MG tablet Take 1 tablet by mouth once daily 30 tablet 3   metoprolol tartrate (LOPRESSOR) 25 MG tablet Take 0.5 tablets (12.5 mg total) by mouth 2 (two) times daily. 60 tablet 3   nitroGLYCERIN (NITROSTAT) 0.4 MG SL tablet Place 1 tablet (0.4 mg total) under the tongue every 5 (five) minutes as needed for chest pain. 25 tablet 12   NP THYROID 90 MG tablet Take 1 tablet by mouth once daily 90 tablet 0   pantoprazole (PROTONIX) 40 MG tablet TAKE 1 TABLET BY MOUTH ONCE DAILY BEFORE SUPPER (Patient taking differently: Take 40 mg by mouth daily.) 90 tablet 3   rosuvastatin (CRESTOR) 20 MG tablet TAKE 1 TABLET BY MOUTH AT BEDTIME 90 tablet 0   warfarin (COUMADIN) 2.5 MG tablet Take 1 tablet by mouth once  daily 90 tablet 0   No current facility-administered medications for this visit.    Physical Exam BP (!) 180/80   Pulse 62   Resp 20   Ht 5\' 10"  (1.778 m)   Wt 167 lb (75.8 kg)   SpO2 92% Comment: RA  BMI 23.52 kg/m  80 year old woman in no acute distress Alert and oriented x3 with no focal deficits Lungs diminished at right base, otherwise clear, no wheezing Cardiac regular rate and rhythm with systolic murmur Incisions well-healed  Diagnostic Tests: I personally reviewed her chest x-ray.  It shows a moderate right pleural effusion.  Impression: Pelagia Iacobucci is a 80 year old woman with a history of heavy tobacco abuse, COPD, aortic stenosis, TAVR, thoracic aortic atherosclerotic disease, coronary disease, PAD, stroke, atrial fibrillation, anticoagulation, hypertension, hyperlipidemia, arthritis, and reflux.  She presented with a cough and was found to have a 4 cm right upper lobe lung mass.  That was hypermetabolic on PET/CT I resected it and it turned out to be a T2b, N0, stage IIa large cell neuroendocrine tumor.  She had air leak initially but that resolved and we removed her chest tube on 08/30/2020  She did not have any significant effusion initially.  However on a chest x-ray couple weeks ago she had a moderate right pleural effusion.  Nothing was done about at that time.  That effusion persists.  I recommended to her that we have an ultrasound-guided thoracentesis done to drain the effusion.    She is on Coumadin and that may need to be held prior to drainage.  She has a follow-up appointment with Dr. Alvy Bimler later this week  Plan: Ultrasound-guided thoracentesis of right pleural effusion Follow-up as scheduled with Dr. Alvy Bimler Return in 3 weeks with PA lateral chest x-ray  Melrose Nakayama, MD [facility] [phone]

## 2020-10-02 ENCOUNTER — Telehealth (HOSPITAL_COMMUNITY): Payer: Self-pay

## 2020-10-02 ENCOUNTER — Other Ambulatory Visit (HOSPITAL_COMMUNITY): Payer: Self-pay | Admitting: Hematology

## 2020-10-02 DIAGNOSIS — I10 Essential (primary) hypertension: Secondary | ICD-10-CM | POA: Diagnosis not present

## 2020-10-02 DIAGNOSIS — Z483 Aftercare following surgery for neoplasm: Secondary | ICD-10-CM | POA: Diagnosis not present

## 2020-10-02 DIAGNOSIS — I480 Paroxysmal atrial fibrillation: Secondary | ICD-10-CM | POA: Diagnosis not present

## 2020-10-02 DIAGNOSIS — C3411 Malignant neoplasm of upper lobe, right bronchus or lung: Secondary | ICD-10-CM | POA: Diagnosis not present

## 2020-10-02 DIAGNOSIS — I2510 Atherosclerotic heart disease of native coronary artery without angina pectoris: Secondary | ICD-10-CM | POA: Diagnosis not present

## 2020-10-02 DIAGNOSIS — J439 Emphysema, unspecified: Secondary | ICD-10-CM | POA: Diagnosis not present

## 2020-10-02 MED ORDER — ONDANSETRON HCL 4 MG PO TABS: 4.0000 mg | ORAL_TABLET | Freq: Three times a day (TID) | ORAL | 0 refills | Status: DC | PRN

## 2020-10-02 NOTE — Progress Notes (Signed)
Katelyn [name], [place] [address]   CLINIC:  Medical Oncology/Hematology  PCP:  Katelyn Albee, MD [place] / [place] [address] [phone]   REASON FOR VISIT:  Follow-up for right lung cancer  PRIOR THERAPY: Right upper lobectomy (08/15/20)  NGS Results: not done  CURRENT THERAPY: surveillance  BRIEF ONCOLOGIC HISTORY:  Oncology History Overview Note  Foundation One PD-L1 TPS score 1 16 mut/MB KRASG12C mutation MSI stable   Cancer of upper lobe of right lung ([place] [place])  08/15/2020 Pathology Results   FINAL MICROSCOPIC DIAGNOSIS:   A. 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  - Tumor is limited to lung.  - Margins are negative for carcinoma.  - One of one lymph nodes negative for carcinoma (0/1).  - See oncology table.   B. LYMPH NODE, 7, EXCISION:  - One of one lymph nodes negative for carcinoma (0/1).   C. LYMPH NODE, 7 #2, EXCISION:  - One of one lymph nodes negative for carcinoma (0/1).   D. LYMPH NODE, 9, EXCISION:  - One of one lymph nodes negative for carcinoma (0/1).   E. LYMPH NODE, 8, EXCISION:  - One of one lymph nodes negative for carcinoma (0/1).   F. LYMPH NODE, 7 #3, EXCISION:  - One of one lymph nodes negative for carcinoma (0/1).   G. LYMPH NODE, 11, EXCISION:  - One of one lymph nodes negative for carcinoma (0/1).   H. LYMPH NODE, 10, EXCISION:  - One of one lymph nodes negative for carcinoma (0/1).   I. LYMPH NODE, 4R, EXCISION:  - One of one lymph nodes negative for carcinoma (0/1).   J. LYMPH NODE, 10 #2, EXCISION:   - One of one lymph nodes negative for carcinoma (0/1).  - Fibrosis and calcifications.   K. LYMPH NODE, 12, EXCISION:  - One of one lymph nodes negative for carcinoma (0/1).   L. LYMPH NODE, 12 #2, EXCISION:  - One of one lymph nodes negative for carcinoma (0/1).   ONCOLOGY TABLE:   LUNG: Resection   Synchronous  Tumors: Not applicable  Total Number of Primary Tumors: 1  Procedure: Right upper lobe lobectomy with lymph node biopsies  Specimen Laterality: Right  Tumor Focality: Unifocal  Tumor Site: Right upper lobe  Tumor Size: 4.4 cm  Histologic Type: Large cell neuroendocrine carcinoma  Visceral Pleura Invasion: Not identified  Direct Invasion of Adjacent Structures: No adjacent structures present  Lymphovascular Invasion: Not identified  Margins: All margins negative for invasive carcinoma       Closest Margin(s) to Invasive Carcinoma: 1.5 cm to bronchial margin  Treatment Effect: No known presurgical therapy  Regional Lymph Nodes:       Number of Lymph Nodes Involved: 0                            Nodal Sites with Tumor: Not applicable       Number of Lymph Nodes Examined: 12                       Nodal Sites Examined: 4, 7, 8, 9, 10, 11, 12  Distant Metastasis:       Distant Site(s) Involved: Not applicable  Pathologic Stage Classification (pTNM, AJCC 8th Edition): pT2b, pN0  Ancillary Studies: Can be performed upon request  Representative Tumor Block: A2  Comment(s): The  tumor has a subtle neuroendocrine features, cribriform areas with necrosis, and geographic areas of necrosis.  Immunohistochemistry is positive for CD56, synaptophysin, and TTF-1.  Chromogranin is largely negative. Ki-67 is elevated. Dr. Saralyn [name] has reviewed the case.     08/15/2020 Surgery   PREOPERATIVE DIAGNOSIS:  Right upper lobe lung mass, suspected non-small cell carcinoma.   POSTOPERATIVE DIAGNOSIS:  Non-small cell carcinoma, right upper lobe, clinical stage IB (T2, N0).   PROCEDURE:   Xi robotic-assisted right upper lobectomy,  Lymph node dissection,  Intercostal nerve blocks levels 3 through 10.   SURGEON:  Katelyn Charon, MD    FINDINGS:  Fragile tissue.  Enlarged but otherwise benign appearing nodes.  One calcified node.  Frozen section revealed non-small cell carcinoma.  Bronchial margin clear.    09/04/2020 Initial Diagnosis   Cancer of upper lobe of right lung ([facility])   09/04/2020 Cancer Staging   Staging form: Lung, AJCC 8th Edition - Pathologic stage from 09/04/2020: pT2, pN0, cM0 - Signed by Katelyn Lark, MD on 09/04/2020  Stage prefix: Initial diagnosis    09/11/2020 Genetic Testing   PD-L1 Results:       CANCER STAGING: Cancer Staging Cancer of upper lobe of right lung [facility]) Staging form: Lung, AJCC 8th Edition - Pathologic stage from 09/04/2020: pT2, pN0, cM0 - Signed by Katelyn Lark, MD on 09/04/2020   INTERVAL HISTORY:  [name], a 80 y.o. female, returns for routine follow-up of her right lung cancer. Katelyn [name] was last seen on 09/18/20 by Dr. Alvy [name].   Today she reports feeling fair. She had a right upper lobectomy on 06/16, and reports that she is recovering well. Her appetite is poor and she reports nausea that is worst in the morning, she denies headaches, vision changes, vomiting, and diarrhea.   REVIEW OF SYSTEMS:  Review of Systems  Constitutional:  Positive for appetite change (25%) and fatigue (50%).  Eyes:  Negative for eye problems.  Respiratory:  Positive for cough.   Gastrointestinal:  Positive for nausea. Negative for diarrhea and vomiting.  Neurological:  Positive for dizziness (occasionally). Negative for headaches.  All other systems reviewed and are negative.  PAST MEDICAL/SURGICAL HISTORY:  Past Medical History:  Diagnosis Date   Allergy    rhinitis   Aortic stenosis, severe    a. s/p TAVR 08/2014.   Arthritis    spine and various joints   Cancer of upper lobe of right lung ([facility]) 09/04/2020   Carotid artery disease ([facility])    Cataract    Cerebrovascular disease 12/05/2018   Childhood asthma    Chronic bronchitis ([facility])        Claudication ([facility])    COPD GOLD II 07/18/2020   Coronary artery disease    a. April 2012 which revealed mid RCA occlusion with collaterals, 50% LAD stenosis, 30% circumflex stenosis, and 40% marginal stenosis    First degree AV block    GERD (gastroesophageal reflux disease)    Heart murmur    Hyperlipidemia    Hypertension    Left bundle branch block    LUMBAR RADICULOPATHY, RIGHT    PAF (paroxysmal atrial fibrillation) ([facility])    Peripheral vascular disease ([facility])    a. s/p aortobifem bypass graft surgery and bilateral fundoplasty by [facility].   Renal artery stenosis ([facility])    a. renal artery stenosis (>60% L renal artery) by duplex 04/2020.   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 bilateral subclavian stenosis with antegrade flow by duplex 07/2020   Past Surgical History:  Procedure Laterality Date   AORTO-FEMORAL BYPASS GRAFT Bilateral 06/27/10   BASAL CELL CARCINOMA EXCISION Left    face   CARDIAC CATHETERIZATION N/A 07/25/2014   Procedure: Right/Left Heart Cath and Coronary Angiography;  Surgeon: Troy Sine, MD;  Location: [facility];  Service: Cardiovascular;  Laterality: N/A;   CATARACT EXTRACTION, BILATERAL Bilateral 09/29/2016   COLONOSCOPY  2008   Dr. Laural Golden: normal.    DILATION AND CURETTAGE OF UTERUS  "2 or 3"   ESOPHAGOGASTRODUODENOSCOPY N/A 08/18/2017   Dr. Gala Romney: Normal-appearing esophagus status post dilation.  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  Small hiatal hernia.   INTERCOSTAL NERVE BLOCK Right 08/15/2020   Procedure: INTERCOSTAL NERVE BLOCK RIGHT;  Surgeon: Melrose Nakayama, MD;  Location: [facility];  Service: Thoracic;  Laterality: Right;   MALONEY DILATION N/A 08/18/2017   Procedure: Venia Minks DILATION;  Surgeon: Daneil Dolin, MD;  [facility];  Service: Endoscopy;  Laterality: N/A;   MULTIPLE TOOTH EXTRACTIONS     NODE DISSECTION Right 08/15/2020   Procedure: NODE  DISSECTION;  Surgeon: Melrose Nakayama, MD;  Location: [facility];  Service: Thoracic;  Laterality: Right;   PR VEIN BYPASS GRAFT,AORTO-FEM-POP  06/12/10   TEE WITHOUT CARDIOVERSION N/A 08/31/2014   Procedure: TRANSESOPHAGEAL ECHOCARDIOGRAM (TEE);  Surgeon: Rexene Alberts, MD;  Location: [facility];  Service: Open Heart Surgery;  Laterality: N/A;   TRANSCATHETER AORTIC VALVE REPLACEMENT, TRANSFEMORAL N/A 08/31/2014   Procedure: TRANSCATHETER AORTIC VALVE REPLACEMENT, TRANSFEMORAL approach;  Surgeon: Rexene Alberts, MD;  Location: [facility];  Service: Open Heart Surgery;  Laterality: N/A;   TUBAL LIGATION  1970's   VAGINAL HYSTERECTOMY  1970's   Partial     SOCIAL HISTORY:  Social History   Socioeconomic History   Marital status: Widowed    Spouse name: bobby   Number of children: 1   Years of education: Not on file   Highest education level: High school graduate  Occupational History   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 Retired,used to work at [employer].Lives alone.         Patient has one daughter, 3 grands   Likes to crochet   Works in [employer].    Social Determinants of Health   Financial Resource Strain: Not on file  Food Insecurity: Not on file  Transportation Needs: Not on file  Physical Activity: Not on file  Stress: Not on file  Social Connections: Not on file  Intimate Partner Violence: Not on file  FAMILY HISTORY:  Family History  Problem Relation Age of Onset   Heart disease  Mother 13       died young   Varicose Veins Mother    Stroke Father    Hypertension Father    Heart disease Father 51       Aneurysm   Heart attack Father    Arthritis Brother    Stroke Brother    Diabetes Daughter    Arthritis Daughter    Heart attack Brother        unsure   Allergic rhinitis Neg Hx    Angioedema Neg Hx    Asthma Neg Hx    Atopy Neg Hx    Eczema Neg Hx    Immunodeficiency Neg Hx    Urticaria Neg Hx     CURRENT MEDICATIONS:  Current Outpatient Medications  Medication Sig Dispense Refill   amiodarone (PACERONE) 200 MG tablet Take 1 tablet by mouth once daily 90 tablet 3   Cholecalciferol (VITAMIN D3) 50 MCG (2000 UT) capsule Take 2,000 Units by mouth daily.     estradiol (ESTRACE) 2 MG tablet Take 1 tablet by mouth once daily 90 tablet 0   ipratropium (ATROVENT) 0.06 % nasal spray USE 1 SPRAY(S) IN EACH NOSTRIL EVERY 6 HOURS AS NEEDED FOR RUNNY NOSE (Patient taking differently: Place 1 spray into both nostrils every 6 (six) hours as needed for rhinitis.) 15 mL 6   meloxicam (MOBIC) 15 MG tablet Take 1 tablet by mouth once daily 30 tablet 3   metoprolol tartrate (LOPRESSOR) 25 MG tablet Take 0.5 tablets (12.5 mg total) by mouth 2 (two) times daily. 60 tablet 3   nitroGLYCERIN (NITROSTAT) 0.4 MG SL tablet Place 1 tablet (0.4 mg total) under the tongue every 5 (five) minutes as needed for chest pain. 25 tablet 12   NP THYROID 90 MG tablet Take 1 tablet by mouth once daily 90 tablet 0   ondansetron (ZOFRAN) 4 MG tablet Take 1 tablet (4 mg total) by mouth every 8 (eight) hours as needed for nausea or vomiting. 20 tablet 0   pantoprazole (PROTONIX) 40 MG tablet TAKE 1 TABLET BY MOUTH ONCE DAILY BEFORE SUPPER (Patient taking differently: Take 40 mg by mouth daily.) 90 tablet 3   rosuvastatin (CRESTOR) 20 MG tablet TAKE 1 TABLET BY MOUTH AT BEDTIME 90 tablet 0   warfarin (COUMADIN) 2.5 MG tablet Take 1 tablet by mouth once daily 90 tablet 0   No current  facility-administered medications for this visit.    ALLERGIES:  Allergies  Allergen Reactions   Adhesive [Tape] Rash   Latex Rash   Tetanus Toxoids Rash    PHYSICAL EXAM:  Performance status (ECOG): 2 - Symptomatic, <50% confined to bed  There were no vitals filed for this visit. Wt Readings from Last 3 Encounters:  10/01/20 167 lb (75.8 kg)  09/18/20 172 lb 4.8 oz (78.2 kg)  09/18/20 172 lb 6.4 oz (78.2 kg)   Physical Exam Vitals reviewed.  Constitutional:      Appearance: Normal appearance.  Cardiovascular:     Rate and Rhythm: Normal rate and regular rhythm.     Pulses: Normal pulses.     Heart sounds: Normal heart sounds.  Pulmonary:     Effort: Pulmonary effort is normal.     Breath sounds: Examination of the right-lower field reveals decreased breath sounds. Decreased breath sounds present.  Musculoskeletal:     Right lower leg: No edema.     Left lower  leg: No edema.  Neurological:     General: No focal deficit present.     Mental Status: She is alert and oriented to person, place, and time.  Psychiatric:        Mood and Affect: Mood normal.        Behavior: Behavior normal.     LABORATORY DATA:  I have reviewed the labs as listed.  CBC Latest Ref Rng & Units 09/16/2020 08/26/2020 08/23/2020  WBC 4.0 - 10.5 K/uL 7.3 11.9(H) 9.9  Hemoglobin 12.0 - 15.0 g/dL 14.1 12.3 12.4  Hematocrit 36.0 - 46.0 % 45.2 37.1 38.3  Platelets 150 - 400 K/uL 146(L) 191 199   CMP Latest Ref Rng & Units 09/16/2020 08/26/2020 08/23/2020  Glucose 70 - 99 mg/dL 115(H) 115(H) 112(H)  BUN 8 - 23 mg/dL 15 19 24(H)  Creatinine 0.44 - 1.00 mg/dL 1.28(H) 1.29(H) 1.21(H)  Sodium 135 - 145 mmol/L 138 135 134(L)  Potassium 3.5 - 5.1 mmol/L 4.3 3.9 4.4  Chloride 98 - 111 mmol/L 102 101 99  CO2 22 - 32 mmol/L '26 26 26  ' Calcium 8.9 - 10.3 mg/dL 8.5(L) 8.3(L) 8.5(L)  Total Protein 6.5 - 8.1 g/dL - - -  Total Bilirubin 0.3 - 1.2 mg/dL - - -  Alkaline Phos 38 - 126 U/L - - -  AST 15 - 41 U/L -  - -  ALT 0 - 44 U/L - - -    DIAGNOSTIC IMAGING:  I have independently reviewed the scans and discussed with the patient. DG Chest 2 View  Result Date: 10/01/2020 CLINICAL DATA:  Lung cancer, postop EXAM: CHEST - 2 VIEW COMPARISON:  09/16/2020 FINDINGS: Postoperative changes on the right. Volume loss with small right pleural effusion. No confluent opacity on the left. Heart is normal size. IMPRESSION: Postoperative changes on the right with volume loss and small right pleural effusion. Electronically Signed   By: Rolm Baptise M.D.   On: 10/01/2020 10:57   DG Chest 2 View  Result Date: 09/16/2020 CLINICAL DATA:  Onset shortness of breath at 5 a.m. this morning. History of lung cancer and COPD. EXAM: CHEST - 2 VIEW COMPARISON:  PA and lateral chest 09/09/2020. FINDINGS: Postoperative change of right upper lobectomy again seen. Right pneumothorax seen on the prior examination has resolved with pleural fluid now tracking from the right lung base over the apex. The left lung is expanded and clear. Emphysema noted. Heart size is normal. Aortic valve repair and aortic atherosclerosis noted. No acute or focal bony abnormality. IMPRESSION: Status post right upper lobectomy. Moderate right pleural effusion has slightly increased in size. Right pneumothorax has resolved. Aortic Atherosclerosis (ICD10-I70.0) and Emphysema (ICD10-J43.9). Electronically Signed   By: Inge Rise M.D.   On: 09/16/2020 14:11   DG Chest 2 View  Result Date: 09/09/2020 CLINICAL DATA:  Recent right upper lobectomy. EXAM: CHEST - 2 VIEW COMPARISON:  Chest x-ray dated August 30, 2020. FINDINGS: The heart size and mediastinal contours are within normal limits. Prior TAVR. Right apical pneumothorax is minimally increased in size. New small right pleural effusion. The left lung is clear. No acute osseous abnormality. IMPRESSION: 1. New small right pleural effusion. 2. Minimally increased right apical pneumothorax. Electronically Signed    By: Titus Dubin M.D.   On: 09/09/2020 13:59   NM PET Image Restage (PS) Skull Base to Thigh  Result Date: 10/02/2020 CLINICAL DATA:  Subsequent treatment strategy for non-small cell lung cancer, assess treatment response in a 80 year old female. Previously shown  to have a hypermetabolic RIGHT upper lobe pulmonary mass. EXAM: NUCLEAR MEDICINE PET SKULL BASE TO THIGH TECHNIQUE: 9.1 mCi F-18 FDG was injected intravenously. Full-ring PET imaging was performed from the skull base to thigh after the radiotracer. CT data was obtained and used for attenuation correction and anatomic localization. Fasting blood glucose: 115 mg/dl COMPARISON:  Jul 23, 2020. FINDINGS: Mediastinal blood pool activity: SUV max 3.0 Liver activity: SUV max NA NECK: No hypermetabolic lymph nodes in the neck. Incidental CT findings: none CHEST: No hypermetabolic mediastinal or hilar nodes. No suspicious pulmonary nodules on the CT scan. Incidental CT findings: Post RIGHT upper lobectomy. Moderate RIGHT-sided pleural effusion. No pneumothorax. Small area of nodularity along the margin of the RIGHT lower lobe at the interface with the pleural effusion without hypermetabolic features, maximum SUV in this area 2.6, central low attenuation exhibited as well. Bandlike changes along suture lines in the RIGHT middle lobe. Airways are patent. Signs of pulmonary emphysema. Apical scarring in the LEFT chest. Trans arterial aortic valve replacement, generalized atherosclerosis and signs of coronary artery disease. Heart size stable. No substantial pericardial effusion. Normal caliber central pulmonary vessels. Limited assessment of cardiovascular structures given lack of intravenous contrast. ABDOMEN/PELVIS: No abnormal hypermetabolic activity within the liver, pancreas, adrenal glands, or spleen. No hypermetabolic lymph nodes in the abdomen or pelvis. Adrenal glands are normal. Incidental CT findings: Cholelithiasis. No acute process related to liver,  pancreas, spleen, adrenal glands or kidneys. Urinary bladder is collapsed. No sign of acute gastrointestinal findings. Post aortic and iliac bypass grafting. Signs of atherosclerotic changes in native abdominal vessels. Low-density area with rounded morphologic features in the LEFT groin anterior to vessels unchanged at 1.8 x 1.8 cm. Compatible with seroma. Post hysterectomy without adnexal mass. SKELETON: RIGHT gluteal nodule with increased metabolic activity (image 009/3) maximum SUV of 10.7 measuring 8 mm. Areas of mildly increased metabolic activity associated with the chest wall, for instance on image 71 of series 4 and on image 93 of series 4 along the lateral chest, likely related to postoperative change. The more inferior of these areas is tract like with a maximum SUV of 3.2 (image 96/4) Marked hypermetabolic change in the RIGHT acromion without underlying CT correlate with a maximum SUV of 8.6 (image 34/4) Incidental CT findings: none IMPRESSION: Post RIGHT upper lobectomy with no hypermetabolic disease in the chest. Findings favored to represent postoperative changes with ovoid area in the medial RIGHT chest along the margin of suture lines adjacent to RIGHT lower lobe and tracts in the RIGHT chest wall with mild increased metabolism, attention on follow-up. Given the ovoid appearance of the area along the posterior surface of the RIGHT lower lobe would suggest short interval follow-up with CT to assess for any changes. Findings that are suspicious for metastatic disease to the RIGHT scapula as described. Correlate with any symptoms, consider MRI for further assessment. Hypermetabolic focus in the RIGHT gluteal region favored to represent injection site. Correlate with any pain in this area. If no recent injection this would be suspicious based on metabolic activity. Aortic Atherosclerosis (ICD10-I70.0) and Emphysema (ICD10-J43.9). Electronically Signed   By: Zetta Bills M.D.   On: 10/02/2020 14:18      ASSESSMENT:  1.  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 - She reported COVID infection in December 2020 and has been fatigued since then. - Chest x-ray on 06/21/2020 showed masslike opacity in the right lung. -  CT chest with contrast on 06/28/2020 showed right upper lobe lung mass measuring 4 x 3.2 x 3.8 cm.  Small noncalcified mediastinal lymph nodes are all subcentimeter, nonspecific.  Small subpleural nodules in the right upper lobe measure up to 5 mm.  Clustered lingular nodules measuring up to 6 mm nonspecific.  Nonspecific 8 mm enhancing focus in the left lobe of the liver. - Right upper lobectomy and lymph node dissection on 08/15/2020. - Pathology consistent with 4.4 cm large cell neuroendocrine carcinoma, margins negative.  Lymph nodes at stations 7, 8, 9, 10, 11, 4R, 12 negative for metastatic disease.  No visceral pleural invasion or direct invasion of the adjacent structures.  No lymphovascular invasion.  IHC positive for CD56, synaptophysin and TTF-1.  Chromogranin negative.  Ki-67 elevated. - PET scan on 10/01/2020 with postoperative changes in the chest wall.  Intense hyper metabolism in the acromion with SUV 8.6.  Hypermetabolic focus in the right gluteal region favoring injection site. - Foundation 1 results showed TMB high, K-ras G12C mutation positive. - PD-L1 TPS 1%.   2.  Social/family history: - She lives by herself at home and is independent of ADLs and IADLs. - She smoked 2 packs/day for 56 years and quit in May 2012. - She worked in [employer]. - Her nephew had colon cancer.  No other malignancies in the family.   PLAN:  1.  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  Areas of mildly increased  metabolic activity associated with the chest wall likely postoperative.  Right gluteal nodule favoring injection site. - Adjuvant chemotherapy was discussed at previous visits but she was too weak to consider it. - She does not report any injury to the right shoulder or pain in that area. - Recommend MRI of the right shoulder with and without contrast to evaluate for metastatic disease. - RTC after MRI.   2.  Shortness of breath on exertion: - She reports that she gets easily short winded on walking. - She has a large pleural effusion on the right side. - She will have right thoracentesis done to improve her breathing.  3.  Nausea: -Continue Zofran 4 mg twice daily as needed which is helping.  Orders placed this encounter:  No orders of the defined types were placed in this encounter.    Derek Jack, MD [place] [phone]   I, Thana Ates, am acting as a scribe for Dr. Derek Jack.  I, Derek Jack MD, have reviewed the above documentation for accuracy and completeness, and I agree with the above.

## 2020-10-02 NOTE — Telephone Encounter (Signed)
Patients Home care nurse called stating that patient has been having nausea in the mornings when she arrived and wanted to know if Dr. Raliegh Ip could call the patient in a prescription for Zofran since her PCP had recently passed away.  Dr. Raliegh Ip sent in a prescription for Zofran 4 Mg for patients nausea.   Patients Home care nurse notified about prescription being sent in for patient. Home care nurse will notify patient of prescription.

## 2020-10-03 ENCOUNTER — Telehealth (INDEPENDENT_AMBULATORY_CARE_PROVIDER_SITE_OTHER): Payer: Self-pay | Admitting: Nurse Practitioner

## 2020-10-03 ENCOUNTER — Inpatient Hospital Stay (HOSPITAL_COMMUNITY): Payer: Medicare Other | Attending: Hematology | Admitting: Hematology

## 2020-10-03 ENCOUNTER — Ambulatory Visit (HOSPITAL_COMMUNITY): Admission: RE | Admit: 2020-10-03 | Payer: Medicare Other | Source: Ambulatory Visit

## 2020-10-03 ENCOUNTER — Other Ambulatory Visit: Payer: Self-pay

## 2020-10-03 VITALS — BP 182/63 | HR 57 | Temp 96.7°F | Resp 18 | Wt 167.8 lb

## 2020-10-03 DIAGNOSIS — C7A8 Other malignant neuroendocrine tumors: Secondary | ICD-10-CM | POA: Diagnosis not present

## 2020-10-03 DIAGNOSIS — Z7901 Long term (current) use of anticoagulants: Secondary | ICD-10-CM | POA: Insufficient documentation

## 2020-10-03 DIAGNOSIS — I739 Peripheral vascular disease, unspecified: Secondary | ICD-10-CM | POA: Diagnosis not present

## 2020-10-03 DIAGNOSIS — I480 Paroxysmal atrial fibrillation: Secondary | ICD-10-CM

## 2020-10-03 DIAGNOSIS — Z952 Presence of prosthetic heart valve: Secondary | ICD-10-CM | POA: Diagnosis not present

## 2020-10-03 DIAGNOSIS — I771 Stricture of artery: Secondary | ICD-10-CM | POA: Insufficient documentation

## 2020-10-03 DIAGNOSIS — I10 Essential (primary) hypertension: Secondary | ICD-10-CM | POA: Diagnosis not present

## 2020-10-03 DIAGNOSIS — Z902 Acquired absence of lung [part of]: Secondary | ICD-10-CM | POA: Insufficient documentation

## 2020-10-03 DIAGNOSIS — R634 Abnormal weight loss: Secondary | ICD-10-CM | POA: Diagnosis not present

## 2020-10-03 DIAGNOSIS — Z79899 Other long term (current) drug therapy: Secondary | ICD-10-CM | POA: Insufficient documentation

## 2020-10-03 DIAGNOSIS — I350 Nonrheumatic aortic (valve) stenosis: Secondary | ICD-10-CM | POA: Insufficient documentation

## 2020-10-03 DIAGNOSIS — I679 Cerebrovascular disease, unspecified: Secondary | ICD-10-CM | POA: Diagnosis not present

## 2020-10-03 DIAGNOSIS — R630 Anorexia: Secondary | ICD-10-CM | POA: Insufficient documentation

## 2020-10-03 DIAGNOSIS — R5383 Other fatigue: Secondary | ICD-10-CM | POA: Diagnosis not present

## 2020-10-03 DIAGNOSIS — Z8673 Personal history of transient ischemic attack (TIA), and cerebral infarction without residual deficits: Secondary | ICD-10-CM | POA: Insufficient documentation

## 2020-10-03 DIAGNOSIS — I701 Atherosclerosis of renal artery: Secondary | ICD-10-CM | POA: Insufficient documentation

## 2020-10-03 DIAGNOSIS — R011 Cardiac murmur, unspecified: Secondary | ICD-10-CM | POA: Insufficient documentation

## 2020-10-03 DIAGNOSIS — I2510 Atherosclerotic heart disease of native coronary artery without angina pectoris: Secondary | ICD-10-CM | POA: Insufficient documentation

## 2020-10-03 DIAGNOSIS — R110 Nausea: Secondary | ICD-10-CM | POA: Diagnosis not present

## 2020-10-03 DIAGNOSIS — C3490 Malignant neoplasm of unspecified part of unspecified bronchus or lung: Secondary | ICD-10-CM | POA: Diagnosis not present

## 2020-10-03 DIAGNOSIS — J42 Unspecified chronic bronchitis: Secondary | ICD-10-CM | POA: Insufficient documentation

## 2020-10-03 DIAGNOSIS — C3411 Malignant neoplasm of upper lobe, right bronchus or lung: Secondary | ICD-10-CM

## 2020-10-03 NOTE — Patient Instructions (Addendum)
[place] at [facility] Discharge Instructions  You were seen today by Dr. Delton Coombes. He went over your recent results and scans. You will be scheduled for an MRI of your right shoulder prior to your next visit. Dr. Delton Coombes will see you back in after your scan for follow up.   Thank you for choosing [place] at [facility] to provide your oncology and hematology care.  To afford each patient quality time with our provider, please arrive at least 15 minutes before your scheduled appointment time.   If you have a lab appointment with the [facility] please come in thru the Main Entrance and check in at the main information desk  You need to re-schedule your appointment should you arrive 10 or more minutes late.  We strive to give you quality time with our providers, and arriving late affects you and other patients whose appointments are after yours.  Also, if you no show three or more times for appointments you may be dismissed from the clinic at the providers discretion.     Again, thank you for choosing [facility].  Our hope is that these requests will decrease the amount of time that you wait before being seen by our physicians.       _____________________________________________________________  Should you have questions after your visit to [facility], please contact our office at [phone] between the hours of 8:00 a.m. and 4:30 p.m.  Voicemails left after 4:00 p.m. will not be returned until the following business day.  For prescription refill requests, have your pharmacy contact our office and allow 72 hours.    Cancer Center Support Programs:   > Cancer Support Group  2nd Tuesday of the month 1pm-2pm, Journey Room

## 2020-10-03 NOTE — Telephone Encounter (Signed)
I called patient and she stated that she does have a cardiologist in [place], Dr. Shelva Majestic. I gave patient the message. Patient verbalized an understanding.

## 2020-10-03 NOTE — Telephone Encounter (Signed)
Megan, please call patient let her know that I got her INR result.  She needs to hold 1 dose of warfarin and then continue taking as currently prescribed.  Tell her to check her INR again next week.  Please let her know that her office is closing at the end of this month so she will need to establish care with a cardiologist if she does not already have one so that they can now take over monitoring her INR.  Nellie, I have ordered referral to cardiology I am not sure if patient is already established with a cardiologist but if not please get her establish somewhere.  Thank you.

## 2020-10-07 ENCOUNTER — Ambulatory Visit (HOSPITAL_COMMUNITY)
Admission: RE | Admit: 2020-10-07 | Discharge: 2020-10-07 | Disposition: A | Discharge status: Home / Self Care | Payer: Medicare Other | Source: Ambulatory Visit | Attending: Thoracic Surgery (Cardiothoracic Vascular Surgery) | Admitting: Thoracic Surgery (Cardiothoracic Vascular Surgery)

## 2020-10-07 ENCOUNTER — Other Ambulatory Visit: Payer: Self-pay

## 2020-10-07 ENCOUNTER — Other Ambulatory Visit: Payer: Self-pay | Admitting: Thoracic Surgery (Cardiothoracic Vascular Surgery)

## 2020-10-07 DIAGNOSIS — Z48813 Encounter for surgical aftercare following surgery on the respiratory system: Secondary | ICD-10-CM | POA: Diagnosis not present

## 2020-10-07 DIAGNOSIS — Z902 Acquired absence of lung [part of]: Secondary | ICD-10-CM

## 2020-10-07 DIAGNOSIS — J90 Pleural effusion, not elsewhere classified: Secondary | ICD-10-CM | POA: Insufficient documentation

## 2020-10-07 HISTORY — PX: IR THORACENTESIS ASP PLEURAL SPACE W/IMG GUIDE: IMG5380

## 2020-10-07 MED ORDER — LIDOCAINE HCL 1 % IJ SOLN
INTRAMUSCULAR | Status: AC
  Filled 2020-10-07: qty 20

## 2020-10-07 NOTE — Procedures (Signed)
PROCEDURE SUMMARY:  Successful US guided right thoracentesis. Yielded 1.7 L of clear yellow fluid. Pt tolerated procedure well. No immediate complications.  CXR ordered; no post-procedure pneumothorax identified.   EBL < 2 mL  Theresa Duty, NP 10/07/2020 3:04 PM

## 2020-10-08 DIAGNOSIS — I480 Paroxysmal atrial fibrillation: Secondary | ICD-10-CM | POA: Diagnosis not present

## 2020-10-08 DIAGNOSIS — C3411 Malignant neoplasm of upper lobe, right bronchus or lung: Secondary | ICD-10-CM | POA: Diagnosis not present

## 2020-10-08 DIAGNOSIS — Z483 Aftercare following surgery for neoplasm: Secondary | ICD-10-CM | POA: Diagnosis not present

## 2020-10-08 DIAGNOSIS — J439 Emphysema, unspecified: Secondary | ICD-10-CM | POA: Diagnosis not present

## 2020-10-08 DIAGNOSIS — I2510 Atherosclerotic heart disease of native coronary artery without angina pectoris: Secondary | ICD-10-CM | POA: Diagnosis not present

## 2020-10-08 DIAGNOSIS — I10 Essential (primary) hypertension: Secondary | ICD-10-CM | POA: Diagnosis not present

## 2020-10-10 ENCOUNTER — Telehealth (INDEPENDENT_AMBULATORY_CARE_PROVIDER_SITE_OTHER): Payer: Self-pay | Admitting: Nurse Practitioner

## 2020-10-10 NOTE — Telephone Encounter (Signed)
Please look into patient's referral to cardiology for management of her a-fib and coumadin. She will need to be established as soon as possible before we close.

## 2020-10-10 NOTE — Telephone Encounter (Signed)
INR this week is 4.3. attempted to call patient and tell her to hold 2 doses of coumadin and then restart coumadin and recheck INR in 1 week. She did not answer the phone so I left a voicemail on her cell with instructions.   Megan, please call this patient to make sure she got the message. I have also referred her to cardiology so they can take over care of her a.fib and management of her coumadin. I have asked Nellie to check up on this.

## 2020-10-11 ENCOUNTER — Other Ambulatory Visit (HOSPITAL_COMMUNITY): Payer: Self-pay | Admitting: Hematology

## 2020-10-14 NOTE — Telephone Encounter (Signed)
Called patient and she stated that she did get your message and she already has a cardiologist Dr. Claiborne Billings with [facility]. I advised for patient to contact Dr. Claiborne Billings to make an appointment to get everything set up for her. Patient verbalized an understanding.

## 2020-10-15 LAB — POCT INR: INR: 2.9 (ref 2.0–3.0)

## 2020-10-16 ENCOUNTER — Ambulatory Visit (HOSPITAL_COMMUNITY): Admission: RE | Admit: 2020-10-16 | Payer: Medicare Other | Source: Ambulatory Visit

## 2020-10-16 ENCOUNTER — Telehealth (INDEPENDENT_AMBULATORY_CARE_PROVIDER_SITE_OTHER): Payer: Self-pay | Admitting: Nurse Practitioner

## 2020-10-16 ENCOUNTER — Other Ambulatory Visit: Payer: Self-pay

## 2020-10-16 ENCOUNTER — Telehealth (HOSPITAL_BASED_OUTPATIENT_CLINIC_OR_DEPARTMENT_OTHER): Payer: Self-pay | Admitting: *Deleted

## 2020-10-16 NOTE — Telephone Encounter (Signed)
Please call patient let her know that I got her INR which is back in goal.  I recommend she continue on her current dose of her warfarin,and check INR next week. Please verify that she has a provider who is going to take over her INR checks because next week will be the last time that I can manage her INR.  I know she has a cardiologist, but I want to make sure that they have told her they will start taking over the INR management.  Thank you.

## 2020-10-16 NOTE — Telephone Encounter (Signed)
Patient called in stating her PCP passed away and she was hoping Dr Claiborne Billings would follow her INR's  She does have home monitoring system  Will forward to Pharm D pool for review.

## 2020-10-16 NOTE — Telephone Encounter (Signed)
Yes, if Dr. Claiborne Billings authorizes INR can be managed by [facility] coumadin clinic.  Looks like her next test date is 8/24

## 2020-10-16 NOTE — Telephone Encounter (Signed)
Called and spoke w/pt and told them that yes we can manage inr so I am awaiting a signature from dr Claiborne Billings to fax in the home inr form

## 2020-10-16 NOTE — Telephone Encounter (Signed)
Patient called in stating her PCP passed away and she was hoping Dr Claiborne Billings would follow her INR's  She does have home monitoring system  Will forward to Pharm D for review
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
## 2020-10-17 DIAGNOSIS — I10 Essential (primary) hypertension: Secondary | ICD-10-CM | POA: Diagnosis not present

## 2020-10-17 DIAGNOSIS — I2510 Atherosclerotic heart disease of native coronary artery without angina pectoris: Secondary | ICD-10-CM | POA: Diagnosis not present

## 2020-10-17 DIAGNOSIS — C3411 Malignant neoplasm of upper lobe, right bronchus or lung: Secondary | ICD-10-CM | POA: Diagnosis not present

## 2020-10-17 DIAGNOSIS — I480 Paroxysmal atrial fibrillation: Secondary | ICD-10-CM | POA: Diagnosis not present

## 2020-10-17 DIAGNOSIS — Z483 Aftercare following surgery for neoplasm: Secondary | ICD-10-CM | POA: Diagnosis not present

## 2020-10-17 DIAGNOSIS — J439 Emphysema, unspecified: Secondary | ICD-10-CM | POA: Diagnosis not present

## 2020-10-21 ENCOUNTER — Encounter (INDEPENDENT_AMBULATORY_CARE_PROVIDER_SITE_OTHER): Payer: Self-pay | Admitting: Nurse Practitioner

## 2020-10-21 ENCOUNTER — Other Ambulatory Visit (INDEPENDENT_AMBULATORY_CARE_PROVIDER_SITE_OTHER): Payer: Self-pay

## 2020-10-21 ENCOUNTER — Telehealth (INDEPENDENT_AMBULATORY_CARE_PROVIDER_SITE_OTHER): Payer: Self-pay

## 2020-10-21 DIAGNOSIS — E785 Hyperlipidemia, unspecified: Secondary | ICD-10-CM

## 2020-10-21 DIAGNOSIS — I480 Paroxysmal atrial fibrillation: Secondary | ICD-10-CM

## 2020-10-21 DIAGNOSIS — Z79890 Hormone replacement therapy (postmenopausal): Secondary | ICD-10-CM

## 2020-10-21 DIAGNOSIS — J310 Chronic rhinitis: Secondary | ICD-10-CM

## 2020-10-21 NOTE — Telephone Encounter (Signed)
Megan, do we know what her blood pressure was? Also, have we determined who is taking over her INR monitoring and coumadin dosing?  Dr. Posey Pronto, please read the below message. This patient has paroxysmal atrial fibrillation. She is also on amiodarone. Would you recommend d/c'ing the metroprolol? The main problem is that this is the last week my office is seeing patients so I will not really be able to follow-up with her if we make medication changes. I believe she sees Dr. Claiborne Billings with Cardiology at [facility].  I do not see any upcoming appointments with him.

## 2020-10-21 NOTE — Telephone Encounter (Signed)
When I spoke to the patient last week she stated that she was going to reach out to her cardiologist for her INR to be monitored.  Katelyn [name] left a message to have you call her back to discuss her findings and did not leave a BP reading on the VM.

## 2020-10-21 NOTE — Progress Notes (Signed)
This encounter was created in error - please disregard.

## 2020-10-21 NOTE — Telephone Encounter (Signed)
Received a voice message from [facility] with [place] and she stated that the patients heart rate was 47 while sitting and when she got up and started walking that it went up to 55. Patient is taking half of the Metoprolol 25mg  in the AM and another half in the PM. Patient has had to lower this medication before due to low heart rate. Janett Billow would like to know how to proceed for this? Please advise. Janett Billow [phone]

## 2020-10-22 ENCOUNTER — Ambulatory Visit (INDEPENDENT_AMBULATORY_CARE_PROVIDER_SITE_OTHER): Payer: Medicare Other | Admitting: Cardiovascular Disease

## 2020-10-22 ENCOUNTER — Ambulatory Visit (HOSPITAL_COMMUNITY): Payer: Medicare Other | Admitting: Hematology

## 2020-10-22 DIAGNOSIS — I4891 Unspecified atrial fibrillation: Secondary | ICD-10-CM

## 2020-10-22 DIAGNOSIS — Z7901 Long term (current) use of anticoagulants: Secondary | ICD-10-CM

## 2020-10-22 LAB — POCT INR: INR: 2.9 (ref 2.0–3.0)

## 2020-10-22 MED ORDER — THYROID 30 MG PO TABS: ORAL_TABLET | ORAL | 0 refills | Status: DC

## 2020-10-22 MED ORDER — THYROID 60 MG PO TABS: ORAL_TABLET | ORAL | 0 refills | Status: DC

## 2020-10-22 MED ORDER — IPRATROPIUM BROMIDE 0.06 % NA SOLN: NASAL | 2 refills | Status: AC

## 2020-10-22 NOTE — Telephone Encounter (Signed)
I called the patient and gave her all messages. Patient stated that she will stop the Metoprolol and she will call her cardiologist to schedule an appointment.   Patient stated that she would like to taper off the Thyroid medication and she is not aware of any problems but she thought they told her she had an enlarged thyroid but she stated that she was never aware of any problem and she did not have a problem other than them looking at labs. Patient stated that she has been on the thyroid for about a year and has not noticed a difference.   Patient has contacted her cardiologist about setting up the INR checks every week. Patient was instructed to do how she was previously doing and she will also call a lab in [place] so they can send results to her cardiologist for medication management.  Patient also requested refills on the following medication because she has not been able to get a PCP yet and has called several places:  ipratropium (ATROVENT) 0.06 % nasal spray  Just used her last refill

## 2020-10-22 NOTE — Telephone Encounter (Signed)
Also, please ask her if she has a history of hypothyroidism and has been actually taking her NP thyroid. I got a refill request for this and want to verify her history. Her labs have looked okay on the 90mg , so I want to verify that she has truly been taking this medication before I approve it. If she does not have a history of hypothyroidism I would recommend she come off of the medication and I will order a taper to stop it if this is the case. The taper will most likely be 60mg  of np thyroid by mouth daily x 2 weeks, then 30 mg of NP thyroid x 2 weeks, then stopping the medication.

## 2020-10-22 NOTE — Addendum Note (Signed)
Addended by: Ailene Ards on: 10/22/2020 02:29 PM   Modules accepted: Orders

## 2020-10-22 NOTE — Telephone Encounter (Signed)
Medications ordered and sent to pharmacy.

## 2020-10-23 ENCOUNTER — Telehealth (INDEPENDENT_AMBULATORY_CARE_PROVIDER_SITE_OTHER): Payer: Self-pay | Admitting: Nurse Practitioner

## 2020-10-23 MED ORDER — ROSUVASTATIN CALCIUM 20 MG PO TABS: 20.0000 mg | ORAL_TABLET | Freq: Every day | ORAL | 0 refills | Status: AC

## 2020-10-23 MED ORDER — ESTRADIOL 2 MG PO TABS: 2.0000 mg | ORAL_TABLET | Freq: Every day | ORAL | 0 refills | Status: AC

## 2020-10-23 MED ORDER — WARFARIN SODIUM 2.5 MG PO TABS: 2.5000 mg | ORAL_TABLET | Freq: Every day | ORAL | 0 refills | Status: DC

## 2020-10-23 NOTE — Telephone Encounter (Signed)
Please call patient and let her know that I saw there was request for referral to OB/GYN.  Unfortunately we do not have the referral coordinator any longer to carry out this referral.  If she would like to continue to stay on her hormone therapy she should try to reach out to [facility] herself by calling [phone].  She could also request for referral when she is established with a new primary care provider.  Her cardiologist may also be willing to order the referral for her.  Please let me know if she has any questions.  Thank you.

## 2020-10-23 NOTE — Telephone Encounter (Signed)
Inr enrollment form faxed to [facility]

## 2020-10-23 NOTE — Telephone Encounter (Signed)
I was given medication request for this patient which I have approved.  But also there is request for referral to OB/GYN.  Unfortunately because we are closing and I will not be this patient's primary care provider any longer decision is that I am not ordering any routine or nonurgent referrals moving forward.  Thus please call the patient let her know that if she is interested in being treated at [facility] for her hormonal therapy she should try calling them herself at [phone].  The other option is she can request referral from her new primary care provider or cardiologist.  I just want make sure the patient is aware of her options.  Thank you.

## 2020-10-23 NOTE — Telephone Encounter (Signed)
I called this patient and she stated that she did not ask for a referral to OBGYN and I gave her the number and patient verbalized an understanding.

## 2020-10-25 ENCOUNTER — Ambulatory Visit (HOSPITAL_COMMUNITY): Payer: Medicare Other

## 2020-10-25 DIAGNOSIS — I2510 Atherosclerotic heart disease of native coronary artery without angina pectoris: Secondary | ICD-10-CM | POA: Diagnosis not present

## 2020-10-25 DIAGNOSIS — C3411 Malignant neoplasm of upper lobe, right bronchus or lung: Secondary | ICD-10-CM | POA: Diagnosis not present

## 2020-10-25 DIAGNOSIS — Z483 Aftercare following surgery for neoplasm: Secondary | ICD-10-CM | POA: Diagnosis not present

## 2020-10-25 DIAGNOSIS — J439 Emphysema, unspecified: Secondary | ICD-10-CM | POA: Diagnosis not present

## 2020-10-25 DIAGNOSIS — I10 Essential (primary) hypertension: Secondary | ICD-10-CM | POA: Diagnosis not present

## 2020-10-25 DIAGNOSIS — I480 Paroxysmal atrial fibrillation: Secondary | ICD-10-CM | POA: Diagnosis not present

## 2020-10-27 ENCOUNTER — Ambulatory Visit
Admission: EM | Admit: 2020-10-27 | Discharge: 2020-10-27 | Disposition: A | Discharge status: Home / Self Care | Payer: Medicare Other | Attending: Family Medicine | Admitting: Family Medicine

## 2020-10-27 ENCOUNTER — Encounter: Payer: Self-pay | Admitting: Emergency Medicine

## 2020-10-27 DIAGNOSIS — J22 Unspecified acute lower respiratory infection: Secondary | ICD-10-CM | POA: Diagnosis not present

## 2020-10-27 DIAGNOSIS — U071 COVID-19: Secondary | ICD-10-CM | POA: Diagnosis not present

## 2020-10-27 MED ORDER — CEFDINIR 300 MG PO CAPS: 300.0000 mg | ORAL_CAPSULE | Freq: Two times a day (BID) | ORAL | 0 refills | Status: AC

## 2020-10-27 MED ORDER — PROMETHAZINE-DM 6.25-15 MG/5ML PO SYRP: 5.0000 mL | ORAL_SOLUTION | Freq: Four times a day (QID) | ORAL | 0 refills | Status: DC | PRN

## 2020-10-27 NOTE — ED Provider Notes (Signed)
[facility]    CSN: [id number] Arrival date & time: 10/27/20  0817      History   Chief Complaint Chief Complaint  Patient presents with   Covid Positive    HPI Katelyn [name] is a 80 y.o. female.   HPI Patient with a known history of aortic stenosis, cancer involving the right lung, cerebrovascular disease presents today for evaluation following a positive home COVID test.  Current symptoms include nausea, persistent cough, nasal congestion and sore throat and overall fatigue and loss of appetite.  Her symptoms initially began over a week ago when she has tested twice which both were negative and subsequently tested positive yesterday.  She has afebrile.  She has been taken over-the-counter medication for management of cough without relief.  She denies any chest pain or active shortness of breath.  Past Medical History:  Diagnosis Date   Allergy    rhinitis   Aortic stenosis, severe    a. s/p TAVR 08/2014.   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 April 2012 which revealed mid RCA occlusion with collaterals, 50% LAD stenosis, 30% circumflex stenosis, and 40% marginal stenosis   First degree AV block    GERD (gastroesophageal reflux disease)    Heart murmur    Hyperlipidemia    Hypertension    Left bundle branch block    LUMBAR RADICULOPATHY, RIGHT    PAF (paroxysmal atrial fibrillation) (HCC)    Peripheral vascular disease ([place])    a. s/p aortobifem bypass graft surgery and bilateral fundoplasty by [facility].   Renal artery stenosis ([place])    a. renal artery stenosis (>60% L renal artery) by duplex 04/2020.   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   Mass of upper lobe of right lung 07/11/2020   Thrombocytopenia ([place]) 03/20/2019   Cerebrovascular disease 12/05/2018   Environmental and seasonal allergies 10/13/2017   Gastroesophageal reflux disease 04/13/2017   Esophageal dysphagia 04/13/2017   Chronic anticoagulation 03/10/2017   Current long-term use of postmenopausal hormone replacement therapy 03/10/2017   Non-allergic rhinitis 11/10/2016   Paroxysmal atrial fibrillation ([place]) 02/13/2016   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   Tachycardia-bradycardia syndrome ([place])    Atrial fibrillation ([place]) 08/29/2014   LBBB (left bundle branch block), chronic 08/29/2014   CAD in native artery    Peripheral vascular disease ([place]) 12/13/2012   Essential  hypertension 12/13/2012   Hyperlipidemia with target LDL less than 70 12/13/2012   Atherosclerosis of native arteries of extremity with intermittent claudication ([place]) 08/10/2011    Past Surgical History:  Procedure Laterality Date   AORTO-FEMORAL BYPASS GRAFT Bilateral 06/27/10   BASAL CELL CARCINOMA EXCISION Left    face   CARDIAC CATHETERIZATION N/A 07/25/2014   Procedure: Right/Left Heart Cath and Coronary Angiography;  Surgeon: Troy Sine, MD;  Location: [facility];  Service: Cardiovascular;   Laterality: N/A;   CATARACT EXTRACTION, BILATERAL Bilateral 09/29/2016   COLONOSCOPY  2008   Dr. Laural Golden: normal.    DILATION AND CURETTAGE OF UTERUS  "2 or 3"   ESOPHAGOGASTRODUODENOSCOPY N/A 08/18/2017   Dr. Gala Romney: Normal-appearing esophagus status post dilation.  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  Small hiatal hernia.   INTERCOSTAL NERVE BLOCK Right 08/15/2020   Procedure: INTERCOSTAL NERVE BLOCK RIGHT;  Surgeon: Melrose Nakayama, MD;  Location: [facility];  Service: Thoracic;  Laterality: Right;   IR THORACENTESIS ASP PLEURAL SPACE W/IMG GUIDE  10/07/2020   MALONEY DILATION N/A 08/18/2017   Procedure: Venia Minks DILATION;  Surgeon: Daneil Dolin, MD;  [facility];  Service: Endoscopy;  Laterality: N/A;   MULTIPLE TOOTH EXTRACTIONS     NODE DISSECTION Right 08/15/2020   Procedure: NODE DISSECTION;  Surgeon: Melrose Nakayama, MD;  Location: [facility];  Service: Thoracic;  Laterality: Right;   PR VEIN BYPASS GRAFT,AORTO-FEM-POP  06/12/10   TEE WITHOUT CARDIOVERSION N/A 08/31/2014   Procedure: TRANSESOPHAGEAL ECHOCARDIOGRAM (TEE);  Surgeon: Rexene Alberts, MD;  Location: [facility];  Service: Open Heart Surgery;  Laterality: N/A;   TRANSCATHETER AORTIC VALVE REPLACEMENT, TRANSFEMORAL N/A 08/31/2014   Procedure: TRANSCATHETER AORTIC VALVE REPLACEMENT, TRANSFEMORAL approach;  Surgeon: Rexene Alberts, MD;  Location: [facility];  Service: Open Heart Surgery;  Laterality: N/A;   TUBAL LIGATION  1970's   VAGINAL HYSTERECTOMY  1970's   Partial     OB History   No obstetric history on file.      Home Medications    Prior to Admission medications   Medication Sig Start Date End Date Taking? Authorizing Provider  cefdinir (OMNICEF) 300 MG capsule Take 1 capsule (300 mg total) by mouth 2 (two) times daily for 10 days. 10/27/20 11/06/20 Yes Scot Jun, FNP  promethazine-dextromethorphan (PROMETHAZINE-DM) 6.25-15 MG/5ML syrup Take 5 mLs by mouth 4 (four) times daily as needed for cough.  10/27/20  Yes Scot Jun, FNP  amiodarone (PACERONE) 200 MG tablet Take 1 tablet by mouth once daily 02/20/20   Troy Sine, MD  Cholecalciferol (VITAMIN D3) 50 MCG (2000 UT) capsule Take 2,000 Units by mouth daily.    [provider]  estradiol (ESTRACE) 2 MG tablet Take 1 tablet (2 mg total) by mouth daily. 10/23/20   Ailene Ards, NP  ipratropium (ATROVENT) 0.06 % nasal spray USE 1 SPRAY(S) IN EACH NOSTRIL EVERY 6 HOURS AS NEEDED FOR RUNNY NOSE 10/22/20   Ailene Ards, NP  meloxicam (MOBIC) 15 MG tablet Take 1 tablet by mouth once daily 05/20/20   Hurshel Party C, MD  nitroGLYCERIN (NITROSTAT) 0.4 MG SL tablet Place 1 tablet (0.4 mg total) under the tongue every 5 (five) minutes as needed for chest pain. 04/04/20 10/01/20  Troy Sine, MD  ondansetron (ZOFRAN) 4 MG tablet TAKE 1 TABLET BY MOUTH EVERY 8 HOURS AS NEEDED FOR NAUSEA OR VOMITING 10/13/20   Derek Jack, MD  pantoprazole (PROTONIX) 40 MG  tablet TAKE 1 TABLET BY MOUTH ONCE DAILY BEFORE SUPPER Patient taking differently: Take 40 mg by mouth daily. 07/16/20   Erenest Rasher, PA-C  rosuvastatin (CRESTOR) 20 MG tablet Take 1 tablet (20 mg total) by mouth at bedtime. 10/23/20   Ailene Ards, NP  thyroid (NP THYROID) 30 MG tablet Take 60mg  by mouth daily for 2 weeks, then take 30mg  by mouth daily for 2 weeks and then stop the medication. 10/22/20   Ailene Ards, NP  thyroid (NP THYROID) 60 MG tablet Take 60mg  by mouth daily for 2 weeks, then take 30mg  by mouth daily for 2 weeks and then stop the medication. 10/22/20   Ailene Ards, NP  warfarin (COUMADIN) 2.5 MG tablet Take 1 tablet (2.5 mg total) by mouth daily. 10/23/20   Ailene Ards, NP    Family History Family History  Problem Relation Age of Onset   Heart disease Mother 63       died young   Varicose Veins Mother    Stroke Father    Hypertension Father    Heart disease Father 40       Aneurysm   Heart attack Father    Arthritis Brother    Stroke  Brother    Diabetes Daughter    Arthritis Daughter    Heart attack Brother        unsure   Allergic rhinitis Neg Hx    Angioedema Neg Hx    Asthma Neg Hx    Atopy Neg Hx    Eczema Neg Hx    Immunodeficiency Neg Hx    Urticaria Neg Hx     Social History Social History   Tobacco Use   Smoking status: Former    Packs/day: 2.00    Years: 56.00    Pack years: 112.00    Types: Cigarettes    Start date: 03/02/1952    Quit date: 06/01/2010    Years since quitting: 10.4   Smokeless tobacco: Never  Vaping Use   Vaping Use: Never used  Substance Use Topics   Alcohol use: No   Drug use: No     Allergies   Adhesive [tape], Latex, and Tetanus toxoids   Review of Systems Review of Systems Pertinent negatives listed in HPI   Physical Exam Triage Vital Signs ED Triage Vitals  Enc Vitals Group     BP      Pulse      Resp      Temp      Temp src      SpO2      Weight      Height      Head Circumference      Peak Flow      Pain Score      Pain Loc      Pain Edu?      Excl. in [place]?    No data found.  Updated Vital Signs BP 118/70 (BP Location: Right Arm)   Pulse 65   Temp 97.6 F (36.4 C) (Tympanic)   Resp 16   SpO2 92%   Visual Acuity Right Eye Distance:   Left Eye Distance:   Bilateral Distance:    Right Eye Near:   Left Eye Near:    Bilateral Near:     Physical Exam Constitutional:      Appearance: She is ill-appearing.  HENT:     Head: Normocephalic and atraumatic.     Nose: Congestion present.  Eyes:  Extraocular Movements: Extraocular movements intact.     Pupils: Pupils are equal, round, and reactive to light.  Cardiovascular:     Rate and Rhythm: Normal rate and regular rhythm.     Heart sounds: Murmur heard.  Pulmonary:     Comments: Coarse lung sounds auscultated upper bronchial region bilaterally Musculoskeletal:     Cervical back: Normal range of motion.  Skin:    Capillary Refill: Capillary refill takes less than 2 seconds.   Neurological:     General: No focal deficit present.  Psychiatric:        Mood and Affect: Mood normal.        Behavior: Behavior normal.        Thought Content: Thought content normal.        Judgment: Judgment normal.     UC Treatments / Results  Labs (all labs ordered are listed, but only abnormal results are displayed) Labs Reviewed - No data to display  EKG   Radiology No results found.  Procedures Procedures (including critical care time)  Medications Ordered in UC Medications - No data to display  Initial Impression / Assessment and Plan / UC Course  I have reviewed the triage vital signs and the nursing notes.  Pertinent labs & imaging results that were available during my care of the patient were reviewed by me and considered in my medical decision making (see chart for details).    COVID-positive Patient is on long-term anticoagulant therapy with Coumadin along with amiodarone both interact with antiviral therapies available for management of COVID 19.  Therefore we will forego prescribing medication due to risk of life-threatening interactions. Cefdinir prescribed for preventative prophylaxis against pneumonia given patient's history of lung cancer and lobectomy. Promethazine DM for cough and congestion. Strict ER precautions if any of her symptoms worsen or do not improve. Final Clinical Impressions(s) / UC Diagnoses   Final diagnoses:  COVID-19 virus infection  Acute lower respiratory infection   Discharge Instructions   None    ED Prescriptions     Medication Sig Dispense Auth. Provider   promethazine-dextromethorphan (PROMETHAZINE-DM) 6.25-15 MG/5ML syrup Take 5 mLs by mouth 4 (four) times daily as needed for cough. 140 mL Scot Jun, FNP   cefdinir (OMNICEF) 300 MG capsule Take 1 capsule (300 mg total) by mouth 2 (two) times daily for 10 days. 20 capsule Scot Jun, FNP      PDMP not reviewed this encounter.   Scot Jun, [place] 10/27/20 [phone]

## 2020-10-27 NOTE — ED Triage Notes (Addendum)
Cough and nausea started on Wednesday and headaches started yesterday.  Positive at home covid test yesterday. Interested in antivirals.

## 2020-10-29 ENCOUNTER — Ambulatory Visit (INDEPENDENT_AMBULATORY_CARE_PROVIDER_SITE_OTHER): Payer: Medicare Other | Admitting: Pharmacist

## 2020-10-29 DIAGNOSIS — I4891 Unspecified atrial fibrillation: Secondary | ICD-10-CM | POA: Diagnosis not present

## 2020-10-29 DIAGNOSIS — Z7901 Long term (current) use of anticoagulants: Secondary | ICD-10-CM

## 2020-10-29 LAB — POCT INR: INR: 2.7 (ref 2.0–3.0)

## 2020-10-29 NOTE — Patient Instructions (Signed)
Description   Continue with 2.5 mg daily and repeat INR in 1 week (home tester - checks weekly)

## 2020-10-30 ENCOUNTER — Ambulatory Visit (HOSPITAL_COMMUNITY): Payer: Medicare Other | Admitting: Hematology

## 2020-11-01 ENCOUNTER — Other Ambulatory Visit: Payer: Self-pay | Admitting: Thoracic Surgery (Cardiothoracic Vascular Surgery)

## 2020-11-01 DIAGNOSIS — C3411 Malignant neoplasm of upper lobe, right bronchus or lung: Secondary | ICD-10-CM

## 2020-11-05 ENCOUNTER — Ambulatory Visit (INDEPENDENT_AMBULATORY_CARE_PROVIDER_SITE_OTHER): Payer: Medicare Other | Admitting: Pharmacist

## 2020-11-05 ENCOUNTER — Encounter: Payer: Self-pay | Admitting: Thoracic Surgery (Cardiothoracic Vascular Surgery)

## 2020-11-05 ENCOUNTER — Ambulatory Visit
Admission: RE | Admit: 2020-11-05 | Discharge: 2020-11-05 | Disposition: A | Discharge status: Home / Self Care | Payer: Medicare Other | Source: Ambulatory Visit | Attending: Thoracic Surgery (Cardiothoracic Vascular Surgery) | Admitting: Thoracic Surgery (Cardiothoracic Vascular Surgery)

## 2020-11-05 ENCOUNTER — Ambulatory Visit (INDEPENDENT_AMBULATORY_CARE_PROVIDER_SITE_OTHER): Payer: Self-pay | Admitting: Thoracic Surgery (Cardiothoracic Vascular Surgery)

## 2020-11-05 ENCOUNTER — Other Ambulatory Visit: Payer: Self-pay

## 2020-11-05 VITALS — BP 127/60 | HR 81 | Resp 20 | Ht 70.0 in | Wt 162.0 lb

## 2020-11-05 DIAGNOSIS — J90 Pleural effusion, not elsewhere classified: Secondary | ICD-10-CM | POA: Diagnosis not present

## 2020-11-05 DIAGNOSIS — C3411 Malignant neoplasm of upper lobe, right bronchus or lung: Secondary | ICD-10-CM

## 2020-11-05 DIAGNOSIS — R918 Other nonspecific abnormal finding of lung field: Secondary | ICD-10-CM | POA: Diagnosis not present

## 2020-11-05 DIAGNOSIS — C3490 Malignant neoplasm of unspecified part of unspecified bronchus or lung: Secondary | ICD-10-CM | POA: Diagnosis not present

## 2020-11-05 DIAGNOSIS — Z7901 Long term (current) use of anticoagulants: Secondary | ICD-10-CM

## 2020-11-05 DIAGNOSIS — I480 Paroxysmal atrial fibrillation: Secondary | ICD-10-CM | POA: Diagnosis not present

## 2020-11-05 LAB — POCT INR: INR: 3.4 — AB (ref 2.0–3.0)

## 2020-11-05 MED ORDER — FUROSEMIDE 20 MG PO TABS: 20.0000 mg | ORAL_TABLET | Freq: Every day | ORAL | 1 refills | Status: DC

## 2020-11-05 MED ORDER — POTASSIUM CHLORIDE CRYS ER 10 MEQ PO TBCR: 10.0000 meq | EXTENDED_RELEASE_TABLET | Freq: Every day | ORAL | 1 refills | Status: DC

## 2020-11-05 NOTE — Progress Notes (Signed)
[address]       [place] [address]             [phone]      HPI: [name] returns for scheduled follow-up visit regarding right pleural effusion post lobectomy.  Katelyn [name] is a 80 year old woman with a history of tobacco abuse, COPD, aortic stenosis, TAVR, rest aortic atherosclerotic disease, coronary disease, PAD, stroke, atrial fibrillation, anticoagulation, hypertension, hyperlipidemia, arthritis, and reflux.  She presented with a cough and was found to have a 4 cm right upper lobe lung mass.  I did a robotic right upper lobectomy on 08/15/2020.  She had a stage IIA (T2b, N0) large cell neuroendocrine tumor.  She had an air leak postoperatively and went home with a 2 g which were ultimately able to remove on 08/30/2020.  She was having a lot of difficulty with weakness and nausea.  Chemotherapy was recommended but she felt too weak to undergo it.  I saw her in the office about a month ago.  It looked like she had some pleural effusion.  We scheduled a thoracentesis which was done on 10/07/2020.  1.7 L of serous fluid was evacuated.  She did have some improvement in her respiratory status after the thoracentesis.  That improvement persists.  Currently her primary complaint remains nausea.  Past Medical History:  Diagnosis Date   Allergy    rhinitis   Aortic stenosis, severe    a. s/p TAVR 08/2014.   Arthritis    spine and various joints   Cancer of upper lobe of right lung ([place]) 09/04/2020   Carotid artery disease ([place])    Cataract    Cerebrovascular disease 12/05/2018   Childhood asthma    Chronic bronchitis ([place])        Claudication ([place])    COPD GOLD II 07/18/2020   Coronary artery disease    a. April 2012 which revealed mid RCA occlusion with collaterals, 50% LAD stenosis, 30% circumflex stenosis, and 40% marginal stenosis   First degree AV block    GERD (gastroesophageal reflux disease)    Heart murmur    Hyperlipidemia    Hypertension     Left bundle branch block    LUMBAR RADICULOPATHY, RIGHT    PAF (paroxysmal atrial fibrillation) ([facility])    Peripheral vascular disease ([place])    a. s/p aortobifem bypass graft surgery and bilateral fundoplasty by [facility].   Renal artery stenosis ([place])    a. renal artery stenosis (>60% L renal artery) by duplex 04/2020.   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) 1982 X 2   "a little bit weaker on the left side since" (08/29/2014)   Subclavian artery stenosis ([facility])    a. bilateral subclavian stenosis with antegrade flow by duplex 07/2020    Current Outpatient Medications  Medication Sig Dispense Refill   amiodarone (PACERONE) 200 MG tablet Take 1 tablet by mouth once daily 90 tablet 3   estradiol (ESTRACE) 2 MG tablet Take 1 tablet (2 mg total) by mouth daily. 90 tablet 0   ipratropium (ATROVENT) 0.06 % nasal spray USE 1 SPRAY(S) IN EACH NOSTRIL EVERY 6 HOURS AS NEEDED FOR RUNNY NOSE 15 mL 2   meloxicam (MOBIC) 15 MG tablet Take 1 tablet by mouth once daily 30 tablet 3  ondansetron (ZOFRAN) 4 MG tablet TAKE 1 TABLET BY MOUTH EVERY 8 HOURS AS NEEDED FOR NAUSEA OR VOMITING 20 tablet 6   pantoprazole (PROTONIX) 40 MG tablet TAKE 1 TABLET BY MOUTH ONCE DAILY BEFORE SUPPER (Patient taking differently: Take 40 mg by mouth daily.) 90 tablet 3   promethazine-dextromethorphan (PROMETHAZINE-DM) 6.25-15 MG/5ML syrup Take 5 mLs by mouth 4 (four) times daily as needed for cough. 140 mL 0   rosuvastatin (CRESTOR) 20 MG tablet Take 1 tablet (20 mg total) by mouth at bedtime. 90 tablet 0   thyroid (NP THYROID) 30 MG tablet Take 60mg  by mouth daily for 2 weeks, then take 30mg  by mouth daily for 2 weeks and then stop the medication. 14 tablet 0   thyroid (NP THYROID) 60 MG tablet Take 60mg  by mouth daily for 2 weeks, then take 30mg  by mouth daily for 2  weeks and then stop the medication. 14 tablet 0   warfarin (COUMADIN) 2.5 MG tablet Take 1 tablet (2.5 mg total) by mouth daily. 90 tablet 0   cefdinir (OMNICEF) 300 MG capsule Take 1 capsule (300 mg total) by mouth 2 (two) times daily for 10 days. (Patient not taking: Reported on 11/05/2020) 20 capsule 0   Cholecalciferol (VITAMIN D3) 50 MCG (2000 UT) capsule Take 2,000 Units by mouth daily. (Patient not taking: Reported on 11/05/2020)     nitroGLYCERIN (NITROSTAT) 0.4 MG SL tablet Place 1 tablet (0.4 mg total) under the tongue every 5 (five) minutes as needed for chest pain. 25 tablet 12   No current facility-administered medications for this visit.    Physical Exam BP 127/60 (BP Location: Left Arm, Patient Position: Sitting)   Pulse 81   Resp 20   Ht 5\' 10"  (1.778 m)   Wt 162 lb (73.5 kg)   SpO2 91% Comment: RA  BMI 23.5 kg/m  80 year old woman in no acute distress Alert and oriented x3 with no focal deficits Lungs diminished throughout, absent at right base, no wheezing Cardiac regular rate and rhythm Incisions well-healed  Diagnostic Tests: I personally reviewed her chest x-ray.  Shows partial reaccumulation of her right pleural effusion.  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  She had a right upper lobectomy on 08/15/2020 for a T2b, M0, stage IIa large cell neuroendocrine tumor.  She had very slow progress initially.  She looks and feels better today than she has since the surgery when I have seen her.  She still has difficulty with nausea.  She is on amiodarone but was on that prior to surgery so I do not think that is the answer.  She is not taking any narcotics.  She developed a  right pleural effusion.  Thoracentesis about a month ago drained 1.7 L of fluid.  There is been partial reaccumulation of that effusion.  The fluid was serous and there may be some component of heart failure.  I am going to try her on Lasix 20 mg daily and potassium 10 mill equivalents daily.  She has been on Lasix and potassium previously.  We will plan to repeat a chest x-ray in about a month.  Plan: Lasix 20 mg daily and K-Dur 10 mEq daily Return in 1 month with PA lateral chest x-ray  Melrose Nakayama, MD [facility] [phone]

## 2020-11-05 NOTE — Patient Instructions (Signed)
Description   Hold dose today and then continue with 2.5 mg daily and repeat INR in 1 week (home tester - checks weekly)

## 2020-11-11 ENCOUNTER — Ambulatory Visit (HOSPITAL_COMMUNITY)
Admission: RE | Admit: 2020-11-11 | Discharge: 2020-11-11 | Disposition: A | Discharge status: Home / Self Care | Payer: Medicare Other | Source: Ambulatory Visit | Attending: Hematology | Admitting: Hematology

## 2020-11-11 ENCOUNTER — Other Ambulatory Visit: Payer: Self-pay

## 2020-11-11 DIAGNOSIS — M25411 Effusion, right shoulder: Secondary | ICD-10-CM | POA: Diagnosis not present

## 2020-11-11 DIAGNOSIS — M7551 Bursitis of right shoulder: Secondary | ICD-10-CM | POA: Diagnosis not present

## 2020-11-11 DIAGNOSIS — C3490 Malignant neoplasm of unspecified part of unspecified bronchus or lung: Secondary | ICD-10-CM | POA: Diagnosis not present

## 2020-11-11 DIAGNOSIS — M19011 Primary osteoarthritis, right shoulder: Secondary | ICD-10-CM | POA: Diagnosis not present

## 2020-11-11 DIAGNOSIS — I96 Gangrene, not elsewhere classified: Secondary | ICD-10-CM | POA: Diagnosis not present

## 2020-11-11 DIAGNOSIS — C3411 Malignant neoplasm of upper lobe, right bronchus or lung: Secondary | ICD-10-CM | POA: Insufficient documentation

## 2020-11-11 MED ORDER — GADOBUTROL 1 MMOL/ML IV SOLN
7.0000 mL | Freq: Once | INTRAVENOUS | Status: AC | PRN
  Administered 2020-11-11: 7 mL via INTRAVENOUS

## 2020-11-12 ENCOUNTER — Ambulatory Visit (INDEPENDENT_AMBULATORY_CARE_PROVIDER_SITE_OTHER): Payer: Medicare Other | Admitting: Pharmacist Clinician (PhC)/ Clinical Pharmacy Specialist

## 2020-11-12 DIAGNOSIS — I4891 Unspecified atrial fibrillation: Secondary | ICD-10-CM | POA: Diagnosis not present

## 2020-11-12 DIAGNOSIS — Z7901 Long term (current) use of anticoagulants: Secondary | ICD-10-CM | POA: Diagnosis not present

## 2020-11-12 LAB — POCT INR: INR: 2.6 (ref 2.0–3.0)

## 2020-11-12 NOTE — Progress Notes (Signed)
Katelyn [name], [place] [id number]   CLINIC:  Medical Oncology/Hematology  PCP:  Default, Provider, MD None None   REASON FOR VISIT:  Follow-up for right lung cancer  PRIOR THERAPY: Right upper lobectomy (08/15/20)  NGS Results: not done  CURRENT THERAPY: surveillance  BRIEF ONCOLOGIC HISTORY:  Oncology History Overview Note  Foundation One PD-L1 TPS score 1 16 mut/MB KRASG12C mutation MSI stable   Cancer of upper lobe of right lung ([place])  08/15/2020 Pathology Results   FINAL MICROSCOPIC DIAGNOSIS:   A. 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  - Tumor is limited to lung.  - Margins are negative for carcinoma.  - One of one lymph nodes negative for carcinoma (0/1).  - See oncology table.   B. LYMPH NODE, 7, EXCISION:  - One of one lymph nodes negative for carcinoma (0/1).   C. LYMPH NODE, 7 #2, EXCISION:  - One of one lymph nodes negative for carcinoma (0/1).   D. LYMPH NODE, 9, EXCISION:  - One of one lymph nodes negative for carcinoma (0/1).   E. LYMPH NODE, 8, EXCISION:  - One of one lymph nodes negative for carcinoma (0/1).   F. LYMPH NODE, 7 #3, EXCISION:  - One of one lymph nodes negative for carcinoma (0/1).   G. LYMPH NODE, 11, EXCISION:  - One of one lymph nodes negative for carcinoma (0/1).   H. LYMPH NODE, 10, EXCISION:  - One of one lymph nodes negative for carcinoma (0/1).   I. LYMPH NODE, 4R, EXCISION:  - One of one lymph nodes negative for carcinoma (0/1).   J. LYMPH NODE, 10 #2, EXCISION:   - One of one lymph nodes negative for carcinoma (0/1).  - Fibrosis and calcifications.   K. LYMPH NODE, 12, EXCISION:  - One of one lymph nodes negative for carcinoma (0/1).   L. LYMPH NODE, 12 #2, EXCISION:  - One of one lymph nodes negative for carcinoma (0/1).   ONCOLOGY TABLE:   LUNG: Resection   Synchronous Tumors: Not applicable  Total Number of  Primary Tumors: 1  Procedure: Right upper lobe lobectomy with lymph node biopsies  Specimen Laterality: Right  Tumor Focality: Unifocal  Tumor Site: Right upper lobe  Tumor Size: 4.4 cm  Histologic Type: Large cell neuroendocrine carcinoma  Visceral Pleura Invasion: Not identified  Direct Invasion of Adjacent Structures: No adjacent structures present  Lymphovascular Invasion: Not identified  Margins: All margins negative for invasive carcinoma       Closest Margin(s) to Invasive Carcinoma: 1.5 cm to bronchial margin  Treatment Effect: No known presurgical therapy  Regional Lymph Nodes:       Number of Lymph Nodes Involved: 0                            Nodal Sites with Tumor: Not applicable       Number of Lymph Nodes Examined: 12                       Nodal Sites Examined: 4, 7, 8, 9, 10, 11, 12  Distant Metastasis:       Distant Site(s) Involved: Not applicable  Pathologic Stage Classification (pTNM, AJCC 8th Edition): pT2b, pN0  Ancillary Studies: Can be performed upon request  Representative Tumor Block: A2  Comment(s): The tumor has a subtle neuroendocrine features, cribriform areas  with necrosis, and geographic areas of necrosis.  Immunohistochemistry is positive for CD56, synaptophysin, and TTF-1.  Chromogranin is largely negative. Ki-67 is elevated. Dr. Saralyn Pilar has reviewed the case.     08/15/2020 Surgery   PREOPERATIVE DIAGNOSIS:  Right upper lobe lung mass, suspected non-small cell carcinoma.   POSTOPERATIVE DIAGNOSIS:  Non-small cell carcinoma, right upper lobe, clinical stage IB (T2, N0).   PROCEDURE:   Xi robotic-assisted right upper lobectomy,  Lymph node dissection,  Intercostal nerve blocks levels 3 through 10.   SURGEON:  Modesto Charon, MD    FINDINGS:  Fragile tissue.  Enlarged but otherwise benign appearing nodes.  One calcified node.  Frozen section revealed non-small cell carcinoma.  Bronchial margin clear.   09/04/2020 Initial Diagnosis   Cancer of  upper lobe of right lung ([place])   09/04/2020 Cancer Staging   Staging form: Lung, AJCC 8th Edition - Pathologic stage from 09/04/2020: pT2, pN0, cM0 - Signed by Heath Lark, MD on 09/04/2020 Stage prefix: Initial diagnosis   09/11/2020 Genetic Testing   PD-L1 Results:       CANCER STAGING: Cancer Staging Cancer of upper lobe of right lung [facility]) Staging form: Lung, AJCC 8th Edition - Pathologic stage from 09/04/2020: pT2, pN0, cM0 - Signed by Heath Lark, MD on 09/04/2020   INTERVAL HISTORY:  Ms. ADDIS [name], a 80 y.o. female, returns for routine follow-up of her right lung cancer. Katelyn was last seen on 10/03/2020.   Today she reports feeling fair. She reports pain in her right shoulder over the past week, and she denies any injuries in this area. Her appetite and energy levels are low. She has been on Coumadin following 2 CVA in 1982. She denies history of DVT and PE. She reports constant nausea that is not helped by Zofran BID. Milk and bananas help the nausea slightly. She reports a history of GERD. She reports raised spots on her hips bilaterally. She denies previous history of MRSA infection.  REVIEW OF SYSTEMS:  Review of Systems  Constitutional:  Positive for appetite change (25%) and fatigue (25%).  Respiratory:  Positive for shortness of breath.   Gastrointestinal:  Positive for nausea.  Musculoskeletal:  Positive for arthralgias (7/10 R shoulder).  All other systems reviewed and are negative.  PAST MEDICAL/SURGICAL HISTORY:  Past Medical History:  Diagnosis Date   Allergy    rhinitis   Aortic stenosis, severe    a. s/p TAVR 08/2014.   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 April 2012 which revealed mid  RCA occlusion with collaterals, 50% LAD stenosis, 30% circumflex stenosis, and 40% marginal stenosis   First degree AV block    GERD (gastroesophageal reflux disease)    Heart murmur    Hyperlipidemia    Hypertension    Left bundle branch block    LUMBAR RADICULOPATHY, RIGHT    PAF (paroxysmal atrial fibrillation) (HCC)    Peripheral vascular disease ([place])    a. s/p aortobifem bypass graft surgery and bilateral fundoplasty by [facility].   Renal artery stenosis ([place])    a. renal artery stenosis (>60% L renal artery) by duplex 04/2020.  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 bilateral subclavian stenosis with antegrade flow by duplex 07/2020   Past Surgical History:  Procedure Laterality Date   AORTO-FEMORAL BYPASS GRAFT Bilateral 06/27/10   BASAL CELL CARCINOMA EXCISION Left    face   CARDIAC CATHETERIZATION N/A 07/25/2014   Procedure: Right/Left Heart Cath and Coronary Angiography;  Surgeon: Troy Sine, MD;  Location: [facility];  Service: Cardiovascular;  Laterality: N/A;   CATARACT EXTRACTION, BILATERAL Bilateral 09/29/2016   COLONOSCOPY  2008   Dr. Laural Golden: normal.    DILATION AND CURETTAGE OF UTERUS  "2 or 3"   ESOPHAGOGASTRODUODENOSCOPY N/A 08/18/2017   Dr. Gala Romney: Normal-appearing esophagus status post dilation.  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  Small hiatal hernia.   INTERCOSTAL NERVE BLOCK Right 08/15/2020   Procedure: INTERCOSTAL NERVE BLOCK RIGHT;  Surgeon: Melrose Nakayama, MD;  Location: [facility];  Service: Thoracic;  Laterality: Right;   IR THORACENTESIS ASP PLEURAL SPACE W/IMG GUIDE  10/07/2020   MALONEY DILATION N/A 08/18/2017   Procedure: Venia Minks DILATION;  Surgeon: Daneil Dolin, MD;  [facility];  Service: Endoscopy;  Laterality: N/A;   MULTIPLE TOOTH EXTRACTIONS     NODE DISSECTION Right 08/15/2020   Procedure: NODE DISSECTION;  Surgeon: Melrose Nakayama, MD;  Location: [facility];  Service: Thoracic;  Laterality: Right;   PR VEIN BYPASS GRAFT,AORTO-FEM-POP  06/12/10   TEE WITHOUT CARDIOVERSION N/A 08/31/2014   Procedure: TRANSESOPHAGEAL ECHOCARDIOGRAM (TEE);  Surgeon: Rexene Alberts, MD;  Location: [facility];  Service: Open Heart Surgery;  Laterality: N/A;   TRANSCATHETER AORTIC VALVE REPLACEMENT, TRANSFEMORAL N/A 08/31/2014   Procedure: TRANSCATHETER AORTIC VALVE REPLACEMENT, TRANSFEMORAL approach;  Surgeon: Rexene Alberts, MD;  Location: [place];  Service: Open Heart Surgery;  Laterality: N/A;   TUBAL LIGATION  1970's   VAGINAL HYSTERECTOMY  1970's   Partial     SOCIAL HISTORY:  Social History   Socioeconomic History   Marital status: Widowed    Spouse name: bobby   Number of children: 1   Years of education: Not on file   Highest education level: High school graduate  Occupational History   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 Retired,used to work at [employer].Lives alone.         Patient has one daughter, 3 grands   Likes to crochet   Works in [employer].    Social Determinants of Health   Financial Resource Strain: Not on file  Food Insecurity: Not on file  Transportation Needs: Not  on file  Physical Activity: Not on file  Stress: Not on file   Social Connections: Not on file  Intimate Partner Violence: Not on file    FAMILY HISTORY:  Family History  Problem Relation Age of Onset   Heart disease Mother 51       died young   Varicose Veins Mother    Stroke Father    Hypertension Father    Heart disease Father 20       Aneurysm   Heart attack Father    Arthritis Brother    Stroke Brother    Diabetes Daughter    Arthritis Daughter    Heart attack Brother        unsure   Allergic rhinitis Neg Hx    Angioedema Neg Hx    Asthma Neg Hx    Atopy Neg Hx    Eczema Neg Hx    Immunodeficiency Neg Hx    Urticaria Neg Hx     CURRENT MEDICATIONS:  Current Outpatient Medications  Medication Sig Dispense Refill   amiodarone (PACERONE) 200 MG tablet Take 1 tablet by mouth once daily 90 tablet 3   Cholecalciferol (VITAMIN D3) 50 MCG (2000 UT) capsule Take 2,000 Units by mouth daily. (Patient not taking: Reported on 11/05/2020)     estradiol (ESTRACE) 2 MG tablet Take 1 tablet (2 mg total) by mouth daily. 90 tablet 0   furosemide (LASIX) 20 MG tablet Take 1 tablet (20 mg total) by mouth daily. 30 tablet 1   ipratropium (ATROVENT) 0.06 % nasal spray USE 1 SPRAY(S) IN EACH NOSTRIL EVERY 6 HOURS AS NEEDED FOR RUNNY NOSE 15 mL 2   meloxicam (MOBIC) 15 MG tablet Take 1 tablet by mouth once daily 30 tablet 3   nitroGLYCERIN (NITROSTAT) 0.4 MG SL tablet Place 1 tablet (0.4 mg total) under the tongue every 5 (five) minutes as needed for chest pain. 25 tablet 12   ondansetron (ZOFRAN) 4 MG tablet TAKE 1 TABLET BY MOUTH EVERY 8 HOURS AS NEEDED FOR NAUSEA OR VOMITING 20 tablet 6   pantoprazole (PROTONIX) 40 MG tablet TAKE 1 TABLET BY MOUTH ONCE DAILY BEFORE SUPPER (Patient taking differently: Take 40 mg by mouth daily.) 90 tablet 3   potassium chloride (KLOR-CON) 10 MEQ tablet Take 1 tablet (10 mEq total) by mouth daily. 14 tablet 1   promethazine-dextromethorphan (PROMETHAZINE-DM) 6.25-15 MG/5ML syrup Take 5 mLs by mouth 4 (four) times daily  as needed for cough. 140 mL 0   rosuvastatin (CRESTOR) 20 MG tablet Take 1 tablet (20 mg total) by mouth at bedtime. 90 tablet 0   thyroid (NP THYROID) 30 MG tablet Take 33m by mouth daily for 2 weeks, then take 356mby mouth daily for 2 weeks and then stop the medication. 14 tablet 0   thyroid (NP THYROID) 60 MG tablet Take 6046my mouth daily for 2 weeks, then take 29m62m mouth daily for 2 weeks and then stop the medication. 14 tablet 0   warfarin (COUMADIN) 2.5 MG tablet Take 1 tablet (2.5 mg total) by mouth daily. 90 tablet 0   No current facility-administered medications for this visit.    ALLERGIES:  Allergies  Allergen Reactions   Adhesive [Tape] Rash   Latex Rash   Tetanus Toxoids Rash    PHYSICAL EXAM:  Performance status (ECOG): 2 - Symptomatic, <50% confined to bed  There were no vitals filed for this visit. Wt Readings from Last 3 Encounters:  11/05/20 162 lb (73.5 kg)  10/03/20 167 lb 12.3 oz (76.1 kg)  10/01/20 167 lb (75.8 kg)   Physical Exam Vitals reviewed.  Constitutional:      Appearance: Normal appearance.  Cardiovascular:     Rate and Rhythm: Normal rate and regular rhythm.     Pulses: Normal pulses.     Heart sounds: Normal heart sounds.  Pulmonary:     Effort: Pulmonary effort is normal.     Breath sounds: Normal breath sounds.  Skin:    Findings: Erythema present.     Comments: L hip carbuncle 2 cm   Neurological:     General: No focal deficit present.     Mental Status: She is alert and oriented to person, place, and time.  Psychiatric:        Mood and Affect: Mood normal.        Behavior: Behavior normal.     LABORATORY DATA:  I have reviewed the labs as listed.  CBC Latest Ref Rng & Units 09/16/2020 08/26/2020 08/23/2020  WBC 4.0 - 10.5 K/uL 7.3 11.9(H) 9.9  Hemoglobin 12.0 - 15.0 g/dL 14.1 12.3 12.4  Hematocrit 36.0 - 46.0 % 45.2 37.1 38.3  Platelets 150 - 400 K/uL 146(L) 191 199   CMP Latest Ref Rng & Units 09/16/2020 08/26/2020  08/23/2020  Glucose 70 - 99 mg/dL 115(H) 115(H) 112(H)  BUN 8 - 23 mg/dL 15 19 24(H)  Creatinine 0.44 - 1.00 mg/dL 1.28(H) 1.29(H) 1.21(H)  Sodium 135 - 145 mmol/L 138 135 134(L)  Potassium 3.5 - 5.1 mmol/L 4.3 3.9 4.4  Chloride 98 - 111 mmol/L 102 101 99  CO2 22 - 32 mmol/L _0 Calcium 8.9 - 10.3 mg/dL 8.5(L) 8.3(L) 8.5(L)  Total Protein 6.5 - 8.1 g/dL - - -  Total Bilirubin 0.3 - 1.2 mg/dL - - -  Alkaline Phos 38 - 126 U/L - - -  AST 15 - 41 U/L - - -  ALT 0 - 44 U/L - - -    DIAGNOSTIC IMAGING:  I have independently reviewed the scans and discussed with the patient. DG Chest 2 View  Result Date: 11/05/2020 CLINICAL DATA:  Lung cancer. EXAM: CHEST - 2 VIEW COMPARISON:  10/07/2020 FINDINGS: Lungs are hyperexpanded. Interval progression of right pleural effusion, now small to moderate in size. Staple line noted right upper lung. No focal consolidation or evidence of pleural effusion. Cardiopericardial silhouette is at upper limits of normal for size. The visualized bony structures of the thorax show no acute abnormality. Status post TAVR. IMPRESSION: Interval progression of right pleural effusion, now small to moderate in size. Electronically Signed   By: Misty Stanley M.D.   On: 11/05/2020 11:43     ASSESSMENT:  1.  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 - She reported COVID infection in December 2020 and has been fatigued since then. - Chest x-ray on 06/21/2020 showed masslike opacity in the right lung. - CT chest with contrast on 06/28/2020 showed right upper lobe lung mass measuring 4 x 3.2 x 3.8 cm.  Small noncalcified mediastinal lymph nodes are all subcentimeter, nonspecific.  Small subpleural nodules in the right upper lobe measure up to 5 mm.  Clustered lingular nodules measuring up to 6 mm nonspecific.  Nonspecific 8 mm enhancing focus in the left lobe of the liver. - Right upper lobectomy  and lymph node dissection on 08/15/2020. - Pathology consistent with 4.4 cm large  cell neuroendocrine carcinoma, margins negative.  Lymph nodes at stations 7, 8, 9, 10, 11, 4R, 12 negative for metastatic disease.  No visceral pleural invasion or direct invasion of the adjacent structures.  No lymphovascular invasion.  IHC positive for CD56, synaptophysin and TTF-1.  Chromogranin negative.  Ki-67 elevated. - PET scan on 10/01/2020 with postoperative changes in the chest wall.  Intense hyper metabolism in the acromion with SUV 8.6.  Hypermetabolic focus in the right gluteal region favoring injection site. - Foundation 1 results showed TMB high, K-ras G12C mutation positive. - PD-L1 TPS 1%.   2.  Social/family history: - She lives by herself at home and is independent of ADLs and IADLs. - She smoked 2 packs/day for 56 years and quit in May 2012. - She worked in [employer]. - Her nephew had colon cancer.  No other malignancies in the family.   PLAN:  1.  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 - She reports right shoulder pain since last 1 week. - Reviewed MRI images of the right shoulder from 11/11/2020 which showed soft tissue mass along the anterior supraspinatus muscle bounded anteriorly by coracoclavicular ligament and inferiorly by coracoid, measuring 2.2 x 2.0 x 2.8 cm, high T2 signal and diffuse enhancement aside from some central necrosis with surrounding edema. - I have recommended IR guided biopsy of the right shoulder lesion.  RTC after biopsy. - She will hold Coumadin for 5 days prior to procedure.   2.  Shortness of breath on exertion: - Status post right thoracentesis on 10/07/2020 with 1.7 L of clear fluid removed. - Breathing has improved.  3.  Nausea: - She is taking Zofran twice daily which is not helping. - We will start her  on Reglan 5 mg 3 times daily AC.  4.  Left hip carbuncle: - She reported pain in the left hip region for the last few days. - There is erythema with subcutaneous mass palpable with no fluctuation. - We will treat with Keflex 500 mg twice daily for 7 days.   Orders placed this encounter:  No orders of the defined types were placed in this encounter.    Derek Jack, MD [facility] [phone]   I, Thana Ates, am acting as a scribe for Dr. Derek Jack.  I, Derek Jack MD, have reviewed the above documentation for accuracy and completeness, and I agree with the above.

## 2020-11-13 ENCOUNTER — Inpatient Hospital Stay (HOSPITAL_COMMUNITY): Payer: Medicare Other | Attending: Hematology | Admitting: Hematology

## 2020-11-13 ENCOUNTER — Other Ambulatory Visit: Payer: Self-pay

## 2020-11-13 VITALS — BP 122/51 | HR 55 | Temp 95.8°F | Resp 16 | Wt 161.0 lb

## 2020-11-13 DIAGNOSIS — Z79899 Other long term (current) drug therapy: Secondary | ICD-10-CM | POA: Insufficient documentation

## 2020-11-13 DIAGNOSIS — R110 Nausea: Secondary | ICD-10-CM | POA: Insufficient documentation

## 2020-11-13 DIAGNOSIS — Z87891 Personal history of nicotine dependence: Secondary | ICD-10-CM | POA: Diagnosis not present

## 2020-11-13 DIAGNOSIS — C3411 Malignant neoplasm of upper lobe, right bronchus or lung: Secondary | ICD-10-CM | POA: Diagnosis not present

## 2020-11-13 DIAGNOSIS — C3490 Malignant neoplasm of unspecified part of unspecified bronchus or lung: Secondary | ICD-10-CM | POA: Diagnosis not present

## 2020-11-13 DIAGNOSIS — Z7901 Long term (current) use of anticoagulants: Secondary | ICD-10-CM | POA: Insufficient documentation

## 2020-11-13 DIAGNOSIS — R0602 Shortness of breath: Secondary | ICD-10-CM | POA: Insufficient documentation

## 2020-11-13 DIAGNOSIS — L02436 Carbuncle of left lower limb: Secondary | ICD-10-CM | POA: Diagnosis not present

## 2020-11-13 DIAGNOSIS — C7A090 Malignant carcinoid tumor of the bronchus and lung: Secondary | ICD-10-CM | POA: Insufficient documentation

## 2020-11-13 DIAGNOSIS — C7A00 Malignant carcinoid tumor of unspecified site: Secondary | ICD-10-CM | POA: Diagnosis present

## 2020-11-13 MED ORDER — METOCLOPRAMIDE HCL 5 MG PO TABS: 10.0000 mg | ORAL_TABLET | Freq: Three times a day (TID) | ORAL | 3 refills | Status: DC

## 2020-11-13 MED ORDER — CEPHALEXIN 500 MG PO CAPS: 500.0000 mg | ORAL_CAPSULE | Freq: Two times a day (BID) | ORAL | 0 refills | Status: DC

## 2020-11-13 NOTE — Patient Instructions (Addendum)
[facility] at [facility] Discharge Instructions  You were seen today by Dr. Delton Coombes. He went over your recent results and scans. Stop taking Zofran. You have been prescribed Reglan to be taken 3 times daily for your nausea. You will be scheduled for a biopsy of your right shoulder. Dr. Delton Coombes will see you back in after your biopsy for labs and follow up.   Thank you for choosing [place] at [facility] to provide your oncology and hematology care.  To afford each patient quality time with our provider, please arrive at least 15 minutes before your scheduled appointment time.   If you have a lab appointment with the [facility] please come in thru the Main Entrance and check in at the main information desk  You need to re-schedule your appointment should you arrive 10 or more minutes late.  We strive to give you quality time with our providers, and arriving late affects you and other patients whose appointments are after yours.  Also, if you no show three or more times for appointments you may be dismissed from the clinic at the providers discretion.     Again, thank you for choosing [facility].  Our hope is that these requests will decrease the amount of time that you wait before being seen by our physicians.       _____________________________________________________________  Should you have questions after your visit to [facility], please contact our office at [phone] between the hours of 8:00 a.m. and 4:30 p.m.  Voicemails left after 4:00 p.m. will not be returned until the following business day.  For prescription refill requests, have your pharmacy contact our office and allow 72 hours.    Cancer Center Support Programs:   > Cancer Support Group  2nd Tuesday of the month 1pm-2pm, Journey Room

## 2020-11-14 DIAGNOSIS — E039 Hypothyroidism, unspecified: Secondary | ICD-10-CM | POA: Insufficient documentation

## 2020-11-14 DIAGNOSIS — C3491 Malignant neoplasm of unspecified part of right bronchus or lung: Secondary | ICD-10-CM | POA: Insufficient documentation

## 2020-11-14 DIAGNOSIS — I2510 Atherosclerotic heart disease of native coronary artery without angina pectoris: Secondary | ICD-10-CM | POA: Insufficient documentation

## 2020-11-14 DIAGNOSIS — R110 Nausea: Secondary | ICD-10-CM | POA: Insufficient documentation

## 2020-11-14 DIAGNOSIS — M1990 Unspecified osteoarthritis, unspecified site: Secondary | ICD-10-CM | POA: Insufficient documentation

## 2020-11-14 DIAGNOSIS — E559 Vitamin D deficiency, unspecified: Secondary | ICD-10-CM | POA: Insufficient documentation

## 2020-11-14 HISTORY — DX: Hypothyroidism, unspecified: E03.9

## 2020-11-15 DIAGNOSIS — I480 Paroxysmal atrial fibrillation: Secondary | ICD-10-CM | POA: Diagnosis not present

## 2020-11-15 DIAGNOSIS — I10 Essential (primary) hypertension: Secondary | ICD-10-CM | POA: Diagnosis not present

## 2020-11-15 DIAGNOSIS — Z8616 Personal history of COVID-19: Secondary | ICD-10-CM | POA: Diagnosis not present

## 2020-11-15 DIAGNOSIS — C3491 Malignant neoplasm of unspecified part of right bronchus or lung: Secondary | ICD-10-CM | POA: Diagnosis not present

## 2020-11-15 DIAGNOSIS — N959 Unspecified menopausal and perimenopausal disorder: Secondary | ICD-10-CM | POA: Insufficient documentation

## 2020-11-15 DIAGNOSIS — C44310 Basal cell carcinoma of skin of unspecified parts of face: Secondary | ICD-10-CM | POA: Diagnosis not present

## 2020-11-15 DIAGNOSIS — I350 Nonrheumatic aortic (valve) stenosis: Secondary | ICD-10-CM | POA: Diagnosis not present

## 2020-11-15 DIAGNOSIS — K219 Gastro-esophageal reflux disease without esophagitis: Secondary | ICD-10-CM | POA: Diagnosis not present

## 2020-11-15 DIAGNOSIS — I739 Peripheral vascular disease, unspecified: Secondary | ICD-10-CM | POA: Diagnosis not present

## 2020-11-15 DIAGNOSIS — E785 Hyperlipidemia, unspecified: Secondary | ICD-10-CM | POA: Diagnosis not present

## 2020-11-15 DIAGNOSIS — Z87891 Personal history of nicotine dependence: Secondary | ICD-10-CM | POA: Diagnosis not present

## 2020-11-15 DIAGNOSIS — I2510 Atherosclerotic heart disease of native coronary artery without angina pectoris: Secondary | ICD-10-CM | POA: Diagnosis not present

## 2020-11-15 DIAGNOSIS — E039 Hypothyroidism, unspecified: Secondary | ICD-10-CM | POA: Diagnosis not present

## 2020-11-18 ENCOUNTER — Telehealth (HOSPITAL_COMMUNITY): Payer: Self-pay | Admitting: Dietician

## 2020-11-18 ENCOUNTER — Encounter (HOSPITAL_COMMUNITY): Payer: Medicare Other | Admitting: Dietician

## 2020-11-18 NOTE — Telephone Encounter (Signed)
Nutrition Assessment   Reason for Assessment: MST (+wt loss, poor appetite)  ASSESSMENT: 80 year old female with non-small cell lung cancer. S/p right upper lobectomy on 6/16. Patient is currently under surveillance. Patient is scheduled for biopsy of right shoulder lesion on 9/28.  Past medical history includes CAD, severe aortic stenosis s/p TAVR, COPD, GERD, HLD, stoke   Spoke with patient via telephone this afternoon. Introduced self and services available at [facility]. Patient agreeable to telephone visit. She reports ongoing nausea which seems to be the worst in the morning. Patient reports history of ulcer, thinks nausea could be due to ulcer. Patient takes Mylanta to coat her stomach. Patient reports trying zofran, this did not work for her. She is now taking Reglan TID with some improvement. Patient reports she has not been eating much, usually eats something light in the morning (cereal, banana, oatmeal), Ensure Plus in the afternoon, had some watermelon and strawberries with Ensure this afternoon, meat with veggie for dinner.   Nutrition Focused Physical Exam: unable to complete at this time   Medications: D3, amiodarone, estrace, lasix, mobic, protonix, klor-con, promethazine, coumadin   Labs: 7/18 labs reviewed   Anthropometrics:   Height: 5'10" Weight: 161 lb (9/14) UBW: 179 lb (6/1) BMI: 23.10    NUTRITION DIAGNOSIS: Unintentional weight loss related to non-small cell right lung cancer and related treatment as evidenced by reported persistent nausea and 10% (18 lb) decrease from usual weight s/p right upper lobectomy on 6/16. This is significant for time frame.  INTERVENTION:  Encouraged small frequent meals and snacks with adequate calories and protein - ideas provided, will mail handout and recipes Discussed tips for nausea, foods best tolerated and foods to avoid - will mail handout Continue Regan as prescribed Continue drinking Ensure Plus/equivalent, recommended  drinking twice daily - will mail coupons Contact information provided    MONITORING, EVALUATION, GOAL: Patient will tolerate increased calories and protein to promote weight stability/gain   Next Visit: via telephone ~4 weeks

## 2020-11-19 ENCOUNTER — Encounter (HOSPITAL_COMMUNITY): Payer: Self-pay

## 2020-11-19 ENCOUNTER — Ambulatory Visit (INDEPENDENT_AMBULATORY_CARE_PROVIDER_SITE_OTHER): Payer: Medicare Other | Admitting: Cardiology

## 2020-11-19 DIAGNOSIS — Z7901 Long term (current) use of anticoagulants: Secondary | ICD-10-CM

## 2020-11-19 LAB — POCT INR: INR: 3.9 — AB (ref 2.0–3.0)

## 2020-11-19 NOTE — Progress Notes (Signed)
Katelyn [name], Katelyn [name] Legal Sex  Female DOB  1940/11/07 SSN  [id number] Address  [address]  [place] [address] Phone  [phone] (Home) *[phone] (Mobile)    RE: CT BIOPSY Received: 5 days ago Derek Jack, MD  Valli Glance Yes.        Previous Messages   ----- Message -----  From: Valli Glance  Sent: 11/14/2020   4:37 PM EDT  To: Derek Jack, MD  Subject: FW: CT BIOPSY                                   Biopsy is approved she's on coumadin it a 4 day hold on. Is it okay for her to comes off?  ----- Message -----  From: Arne Cleveland, MD  Sent: 11/14/2020   8:11 AM EDT  To: Valli Glance  Subject: RE: CT BIOPSY                                   Ok   US biopsy R shoulder ST lesion  See MR      DDH     ----- Message -----  From: Valli Glance  Sent: 11/13/2020   2:25 PM EDT  To: Jillyn Hidden, Ir Procedure Requests  Subject: CT BIOPSY                                       Procedue: CT Biopsy   Reason: Cancer of upper lobe of right lung, Malignant neoplasm of unspecified part of unspecified bronchus or lung ,New pain in right shoulder. Humerus abnormalities on PET.   HX: MRI, PET in Computer   Provider: Derek Jack   Contact: [phone]

## 2020-11-25 ENCOUNTER — Other Ambulatory Visit: Payer: Self-pay | Admitting: Radiology

## 2020-11-26 ENCOUNTER — Telehealth: Payer: Self-pay

## 2020-11-26 ENCOUNTER — Other Ambulatory Visit: Payer: Self-pay | Admitting: Radiology

## 2020-11-26 ENCOUNTER — Ambulatory Visit (INDEPENDENT_AMBULATORY_CARE_PROVIDER_SITE_OTHER): Payer: Medicare Other | Admitting: Cardiovascular Disease

## 2020-11-26 DIAGNOSIS — Z7901 Long term (current) use of anticoagulants: Secondary | ICD-10-CM | POA: Diagnosis not present

## 2020-11-26 DIAGNOSIS — Z5181 Encounter for therapeutic drug level monitoring: Secondary | ICD-10-CM | POA: Diagnosis not present

## 2020-11-26 LAB — POCT INR: INR: 2.3 (ref 2.0–3.0)

## 2020-11-26 NOTE — Telephone Encounter (Signed)
Lmom to self test

## 2020-11-26 NOTE — Patient Instructions (Signed)
Description   Spoke with patient and instructed to follow pre-procedure instructions and then continue with 2.5 mg daily and repeat INR in 1 week (home tester - checks weekly)

## 2020-11-27 ENCOUNTER — Other Ambulatory Visit (HOSPITAL_COMMUNITY): Payer: Self-pay | Admitting: Hematology

## 2020-11-27 ENCOUNTER — Ambulatory Visit (HOSPITAL_COMMUNITY)
Admission: RE | Admit: 2020-11-27 | Discharge: 2020-11-27 | Disposition: A | Discharge status: Home / Self Care | Payer: Medicare Other | Source: Ambulatory Visit | Attending: Hematology | Admitting: Hematology

## 2020-11-27 ENCOUNTER — Other Ambulatory Visit: Payer: Self-pay

## 2020-11-27 ENCOUNTER — Encounter (HOSPITAL_COMMUNITY): Payer: Self-pay

## 2020-11-27 DIAGNOSIS — C7B8 Other secondary neuroendocrine tumors: Secondary | ICD-10-CM | POA: Insufficient documentation

## 2020-11-27 DIAGNOSIS — I2510 Atherosclerotic heart disease of native coronary artery without angina pectoris: Secondary | ICD-10-CM | POA: Diagnosis not present

## 2020-11-27 DIAGNOSIS — R1900 Intra-abdominal and pelvic swelling, mass and lump, unspecified site: Secondary | ICD-10-CM | POA: Diagnosis not present

## 2020-11-27 DIAGNOSIS — K219 Gastro-esophageal reflux disease without esophagitis: Secondary | ICD-10-CM | POA: Insufficient documentation

## 2020-11-27 DIAGNOSIS — J42 Unspecified chronic bronchitis: Secondary | ICD-10-CM | POA: Insufficient documentation

## 2020-11-27 DIAGNOSIS — J449 Chronic obstructive pulmonary disease, unspecified: Secondary | ICD-10-CM | POA: Diagnosis not present

## 2020-11-27 DIAGNOSIS — I10 Essential (primary) hypertension: Secondary | ICD-10-CM | POA: Insufficient documentation

## 2020-11-27 DIAGNOSIS — C3490 Malignant neoplasm of unspecified part of unspecified bronchus or lung: Secondary | ICD-10-CM

## 2020-11-27 DIAGNOSIS — I447 Left bundle-branch block, unspecified: Secondary | ICD-10-CM | POA: Insufficient documentation

## 2020-11-27 DIAGNOSIS — I701 Atherosclerosis of renal artery: Secondary | ICD-10-CM | POA: Insufficient documentation

## 2020-11-27 DIAGNOSIS — R229 Localized swelling, mass and lump, unspecified: Secondary | ICD-10-CM | POA: Diagnosis present

## 2020-11-27 DIAGNOSIS — C3411 Malignant neoplasm of upper lobe, right bronchus or lung: Secondary | ICD-10-CM

## 2020-11-27 DIAGNOSIS — C7A090 Malignant carcinoid tumor of the bronchus and lung: Secondary | ICD-10-CM | POA: Diagnosis not present

## 2020-11-27 MED ORDER — SODIUM CHLORIDE 0.9 % IV SOLN: 8.0000 mg | Freq: Once | INTRAVENOUS | Status: DC

## 2020-11-27 MED ORDER — FENTANYL CITRATE (PF) 100 MCG/2ML IJ SOLN
INTRAMUSCULAR | Status: DC | PRN
  Administered 2020-11-27 (×2): 25 ug via INTRAVENOUS

## 2020-11-27 MED ORDER — SODIUM CHLORIDE 0.9 % IV SOLN: INTRAVENOUS | Status: DC

## 2020-11-27 MED ORDER — LIDOCAINE HCL (PF) 1 % IJ SOLN
INTRAMUSCULAR | Status: AC
  Filled 2020-11-27: qty 30

## 2020-11-27 MED ORDER — MIDAZOLAM HCL 2 MG/2ML IJ SOLN
INTRAMUSCULAR | Status: AC
  Filled 2020-11-27: qty 2

## 2020-11-27 MED ORDER — GELATIN ABSORBABLE 12-7 MM EX MISC
CUTANEOUS | Status: AC
  Filled 2020-11-27: qty 1

## 2020-11-27 MED ORDER — FENTANYL CITRATE (PF) 100 MCG/2ML IJ SOLN
INTRAMUSCULAR | Status: AC
  Filled 2020-11-27: qty 2

## 2020-11-27 MED ORDER — ONDANSETRON HCL 4 MG/2ML IJ SOLN
INTRAMUSCULAR | Status: AC
  Administered 2020-11-27: 4 mg via INTRAVENOUS
  Filled 2020-11-27: qty 2

## 2020-11-27 MED ORDER — ONDANSETRON HCL 4 MG/2ML IJ SOLN: 4.0000 mg | Freq: Once | INTRAMUSCULAR | Status: AC

## 2020-11-27 MED ORDER — MIDAZOLAM HCL 2 MG/2ML IJ SOLN
INTRAMUSCULAR | Status: DC | PRN
  Administered 2020-11-27: 1 mg via INTRAVENOUS

## 2020-11-27 NOTE — Discharge Instructions (Signed)

## 2020-11-27 NOTE — Procedures (Signed)
Interventional Radiology Procedure:   Indications: History of neuroendocrine tumor in right lung.  Suspicious lesion in right shoulder and suspicious right pelvic subcutaneous nodule  Procedure: US guided core biopsy of right pelvis subcutaneous nodule  Findings: Heterogenous nodule, 6 cores obtained.   Complications: No immediate complications noted.     EBL: Minimal  Plan: Discharge to home in 1 hour.   Delailah Spieth R. Anselm Pancoast, MD  Pager: [phone]

## 2020-11-27 NOTE — H&P (Signed)
Chief Complaint: Patient was seen in consultation today for subcutaneous nodules  Referring Physician(s): Katragadda,Sreedhar  Supervising Physician: Markus Daft  Patient Status: [facility] - Out-pt  History of Present Illness: Katelyn [name] is a 80 y.o. female with past medical history of asthma, chronic bronchitis, COPD, CAD, GERD, HLD, HTN, LBBB, renal artery stenosis and recent diagnosis of neuroendocrine cancer of the right lung who presents with recent right shoulder pain and hypermetabolic activity on PET scan 10/01/20.  She is referred to [facility] for biopsy of the shoulder lesion which was approved by Dr. Vernard Gambles. On presentation today, however, she complains of increased shoulder pain and new/worsening subcutaneous nodules present bilaterally.  A right hip lesion/nodule showed hypermetabolic activity on PET. She is assesssed by Dr. Anselm Pancoast who is concerned these nodule may be related to her disease process. Plan made with MD, patient, and daughter to proceed with subcutaneous nodule biopsy. She would like to proceed with sedation as planned.  She has been NPO.  She is on coumadin.   Past Medical History:  Diagnosis Date   Allergy    rhinitis   Aortic stenosis, severe    a. s/p TAVR 08/2014.   Arthritis    spine and various joints   Cancer of upper lobe of right lung ([place]) 09/04/2020   Carotid artery disease ([place])    Cataract    Cerebrovascular disease 12/05/2018   Childhood asthma    Chronic bronchitis ([place])        Claudication ([place])    COPD GOLD II 07/18/2020   Coronary artery disease    a. April 2012 which revealed mid RCA occlusion with collaterals, 50% LAD stenosis, 30% circumflex stenosis, and 40% marginal stenosis   First degree AV block    GERD (gastroesophageal reflux disease)    Heart murmur    Hyperlipidemia    Hypertension    Left bundle branch block    LUMBAR RADICULOPATHY, RIGHT    PAF (paroxysmal atrial fibrillation) (HCC)    Peripheral vascular disease  ([place])    a. s/p aortobifem bypass graft surgery and bilateral fundoplasty by [facility].   Renal artery stenosis ([place])    a. renal artery stenosis (>60% L renal artery) by duplex 04/2020.   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 bilateral subclavian stenosis with antegrade flow by duplex 07/2020    Past Surgical History:  Procedure Laterality Date   AORTO-FEMORAL BYPASS GRAFT Bilateral 06/27/10   BASAL CELL CARCINOMA EXCISION Left    face   CARDIAC CATHETERIZATION N/A 07/25/2014   Procedure: Right/Left Heart Cath and Coronary Angiography;  Surgeon: Troy Sine, MD;  Location: [place] [facility];  Service: Cardiovascular;  Laterality: N/A;   CATARACT EXTRACTION, BILATERAL Bilateral 09/29/2016   COLONOSCOPY  2008   Dr. Laural Golden: normal.    DILATION AND CURETTAGE OF UTERUS  "2 or 3"   ESOPHAGOGASTRODUODENOSCOPY N/A 08/18/2017   Dr. Gala Romney: Normal-appearing esophagus status post dilation.  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  Small hiatal hernia.   INTERCOSTAL NERVE BLOCK Right 08/15/2020   Procedure: INTERCOSTAL NERVE BLOCK RIGHT;  Surgeon: Melrose Nakayama, MD;  Location: [place];  Service: Thoracic;  Laterality: Right;  IR THORACENTESIS ASP PLEURAL SPACE W/IMG GUIDE  10/07/2020   MALONEY DILATION N/A 08/18/2017   Procedure: Venia Minks DILATION;  Surgeon: Daneil Dolin, MD;  [facility];  Service: Endoscopy;  Laterality: N/A;   MULTIPLE TOOTH EXTRACTIONS     NODE DISSECTION Right 08/15/2020   Procedure: NODE DISSECTION;  Surgeon: Melrose Nakayama, MD;  Location: [place];  Service: Thoracic;  Laterality: Right;   PR VEIN BYPASS GRAFT,AORTO-FEM-POP  06/12/10   TEE WITHOUT CARDIOVERSION N/A 08/31/2014    Procedure: TRANSESOPHAGEAL ECHOCARDIOGRAM (TEE);  Surgeon: Rexene Alberts, MD;  Location: [place];  Service: Open Heart Surgery;  Laterality: N/A;   TRANSCATHETER AORTIC VALVE REPLACEMENT, TRANSFEMORAL N/A 08/31/2014   Procedure: TRANSCATHETER AORTIC VALVE REPLACEMENT, TRANSFEMORAL approach;  Surgeon: Rexene Alberts, MD;  Location: [place];  Service: Open Heart Surgery;  Laterality: N/A;   TUBAL LIGATION  1970's   VAGINAL HYSTERECTOMY  1970's   Partial     Allergies: Adhesive [tape], Latex, and Tetanus toxoids  Medications: Prior to Admission medications   Medication Sig Start Date End Date Taking? Authorizing Provider  amiodarone (PACERONE) 200 MG tablet Take 1 tablet by mouth once daily 02/20/20  Yes Troy Sine, MD  chlorthalidone (HYGROTON) 25 MG tablet Take 12.5 mg by mouth daily. 10/17/20  Yes [provider]  estradiol (ESTRACE) 2 MG tablet Take 1 tablet (2 mg total) by mouth daily. 10/23/20  Yes Ailene Ards, NP  ipratropium (ATROVENT) 0.06 % nasal spray USE 1 SPRAY(S) IN EACH NOSTRIL EVERY 6 HOURS AS NEEDED FOR RUNNY NOSE 10/22/20  Yes Ailene Ards, NP  irbesartan (AVAPRO) 300 MG tablet Take 300 mg by mouth daily. 10/17/20  Yes [provider]  meloxicam (MOBIC) 15 MG tablet Take 1 tablet by mouth once daily 05/20/20  Yes Gosrani, Nimish C, MD  metoCLOPramide (REGLAN) 5 MG tablet Take 2 tablets (10 mg total) by mouth 3 (three) times daily before meals. 11/13/20  Yes Derek Jack, MD  pantoprazole (PROTONIX) 40 MG tablet TAKE 1 TABLET BY MOUTH ONCE DAILY BEFORE SUPPER Patient taking differently: Take 40 mg by mouth daily. 07/16/20  Yes Aliene Altes S, PA-C  potassium chloride (KLOR-CON) 10 MEQ tablet Take 1 tablet (10 mEq total) by mouth daily. 11/05/20  Yes Melrose Nakayama, MD  rosuvastatin (CRESTOR) 20 MG tablet Take 1 tablet (20 mg total) by mouth at bedtime. 10/23/20  Yes Ailene Ards, NP  cephALEXin (KEFLEX) 500 MG capsule Take 1 capsule (500 mg  total) by mouth 2 (two) times daily. 11/13/20   Derek Jack, MD  Cholecalciferol (VITAMIN D3) 50 MCG (2000 UT) capsule Take 2,000 Units by mouth daily.    [provider]  furosemide (LASIX) 20 MG tablet Take 1 tablet (20 mg total) by mouth daily. 11/05/20   Melrose Nakayama, MD  nitroGLYCERIN (NITROSTAT) 0.4 MG SL tablet Place 1 tablet (0.4 mg total) under the tongue every 5 (five) minutes as needed for chest pain. 04/04/20 10/01/20  Troy Sine, MD  ondansetron (ZOFRAN) 4 MG tablet TAKE 1 TABLET BY MOUTH EVERY 8 HOURS AS NEEDED FOR NAUSEA OR VOMITING Patient not taking: No sig reported 10/13/20   Derek Jack, MD  promethazine-dextromethorphan (PROMETHAZINE-DM) 6.25-15 MG/5ML syrup Take 5 mLs by mouth 4 (four) times daily as needed for cough. 10/27/20   Scot Jun, FNP  thyroid (NP THYROID) 30 MG tablet Take 60mg  by mouth daily for 2 weeks, then take 30mg  by mouth daily for 2  weeks and then stop the medication. 10/22/20   Ailene Ards, NP  thyroid (NP THYROID) 60 MG tablet Take 60mg  by mouth daily for 2 weeks, then take 30mg  by mouth daily for 2 weeks and then stop the medication. 10/22/20   Ailene Ards, NP  warfarin (COUMADIN) 2.5 MG tablet Take 1 tablet (2.5 mg total) by mouth daily. 10/23/20   Ailene Ards, NP     Family History  Problem Relation Age of Onset   Heart disease Mother 28       died young   Varicose Veins Mother    Stroke Father    Hypertension Father    Heart disease Father 32       Aneurysm   Heart attack Father    Arthritis Brother    Stroke Brother    Diabetes Daughter    Arthritis Daughter    Heart attack Brother        unsure   Allergic rhinitis Neg Hx    Angioedema Neg Hx    Asthma Neg Hx    Atopy Neg Hx    Eczema Neg Hx    Immunodeficiency Neg Hx    Urticaria Neg Hx     Social History   Socioeconomic History   Marital status: Widowed    Spouse name: bobby   Number of children: 1   Years of education: Not on file    Highest education level: High school graduate  Occupational History   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 Retired,used to work at [employer].Lives alone.         Patient has one daughter, 3 grands   Likes to crochet   Works in [employer].    Social Determinants of Health   Financial Resource Strain: Not on file  Food Insecurity: Not on file  Transportation Needs: Not on file  Physical Activity: Not on file  Stress: Not on file  Social Connections: Not on file     Review of Systems: A 12 point ROS discussed and pertinent positives are indicated in the HPI above.  All other systems are negative.  Review of Systems  Constitutional:  Negative for fatigue and fever.  Respiratory:  Negative for cough and shortness of breath.   Cardiovascular:  Negative for chest pain.  Gastrointestinal:  Negative for abdominal pain.  Musculoskeletal:  Negative for back pain.  Psychiatric/Behavioral:  Negative for behavioral problems and confusion.    Vital Signs: BP (!) 136/54   Pulse (!) 51   Temp 97.7 F (36.5 C) (Oral)   Resp 14   Ht 5\' 10"  (1.778 m)   Wt 159 lb (72.1 kg)   SpO2 96%   BMI 22.81 kg/m   Physical Exam Vitals and nursing note reviewed.  Constitutional:      General: She is not in acute distress.  Appearance: Normal appearance. She is not ill-appearing.  HENT:     Mouth/Throat:     Mouth: Mucous membranes are moist.     Pharynx: Oropharynx is clear.  Cardiovascular:     Rate  and Rhythm: Normal rate and regular rhythm.  Pulmonary:     Effort: Pulmonary effort is normal. No respiratory distress.     Breath sounds: Normal breath sounds.  Abdominal:     General: Abdomen is flat.     Palpations: Abdomen is soft.  Skin:    General: Skin is warm and dry.     Findings: Lesion (subcutaneous nodule of the right hip/buttock.  Wound noted to the left hip.) present.  Neurological:     General: No focal deficit present.     Mental Status: She is alert and oriented to person, place, and time. Mental status is at baseline.  Psychiatric:        Mood and Affect: Mood normal.        Behavior: Behavior normal.        Thought Content: Thought content normal.        Judgment: Judgment normal.     MD Evaluation Airway: WNL Heart: WNL Abdomen: WNL Chest/ Lungs: WNL ASA  Classification: 3 Mallampati/Airway Score: Two   Imaging: DG Chest 2 View  Result Date: 11/05/2020 CLINICAL DATA:  Lung cancer. EXAM: CHEST - 2 VIEW COMPARISON:  10/07/2020 FINDINGS: Lungs are hyperexpanded. Interval progression of right pleural effusion, now small to moderate in size. Staple line noted right upper lung. No focal consolidation or evidence of pleural effusion. Cardiopericardial silhouette is at upper limits of normal for size. The visualized bony structures of the thorax show no acute abnormality. Status post TAVR. IMPRESSION: Interval progression of right pleural effusion, now small to moderate in size. Electronically Signed   By: Misty Stanley M.D.   On: 11/05/2020 11:43   MR SHOULDER RIGHT W WO CONTRAST  Result Date: 11/13/2020 CLINICAL DATA:  Right shoulder pain, on PET-CT there is evidence of a hypermetabolic lesion in the vicinity of the right scapula. EXAM: MRI OF THE RIGHT SHOULDER WITHOUT AND WITH CONTRAST TECHNIQUE: Multiplanar, multisequence MR imaging of the the right shoulder was performed before and after the administration of intravenous contrast. CONTRAST:  58mL GADAVIST  GADOBUTROL 1 MMOL/ML IV SOLN COMPARISON:  PET-CT 10/01/2020 FINDINGS: Despite efforts by the technologist and patient, motion artifact is present on today's exam and could not be eliminated. This reduces exam sensitivity and specificity. Rotator cuff:  No rotator cuff tear identified. Muscles: Interposed between the anterior supraspinatus muscle, the coracoclavicular ligament, and the coracoid on image 19 of series 8, a 2.2 by 2.0 by 2.8 cm enhancing mass with mild central necrosis is present. This abuts the periosteal margin of the base of the coracoid with some subtle edema and endosteal enhancement which is likely reactive; I do not see definite cortical destruction to suggest that the coracoid is overtly invaded. The lesion causes a concave anterior margin of the supraspinatus muscle and there is edema and enhancement tracking along adjacent fascia planes. Biceps long head:  Unremarkable Acromioclavicular Joint: Moderate spurring and mild subcortical marrow edema compatible with moderate degenerative AC joint arthropathy. Type I acromion. There is only trace fluid in the subacromial subdeltoid bursa. Glenohumeral Joint: Unremarkable Labrum:  Grossly unremarkable Bones: As noted above, the lesion of concern is primarily in the soft tissues along the posterosuperior margin of the coracoid, with only subtle endosteal edema and enhancement adjacent to the  dominant lesion. Other: Right pleural effusion. IMPRESSION: 1. The hypermetabolic lesion along the right shoulder turns out to be in the soft tissues along the anterior supraspinatus muscle, bounded anteriorly by the coracoclavicular ligament and inferiorly by the coracoid. This lesion measures 2.2 by 2.0 by 2.8 cm and demonstrates high T2 signal and diffuse enhancement aside from what appears to be some central necrosis, with surrounding edema and enhancement tracking along regional fascia planes. There is some subtle and likely reactive edema along the end ostium  of the base of the coracoid adjacent to the lesion but no definite bony destructive findings at this time. 2. Moderate degenerative AC joint arthropathy. 3. Trace subacromial subdeltoid bursitis. 4. Right pleural effusion. Electronically Signed   By: Van Clines M.D.   On: 11/13/2020 08:14    Labs:  CBC: Recent Labs    08/20/20 0015 08/23/20 0059 08/26/20 0048 09/16/20 1359  WBC 8.3 9.9 11.9* 7.3  HGB 11.4* 12.4 12.3 14.1  HCT 35.7* 38.3 37.1 45.2  PLT 150 199 191 146*    COAGS: Recent Labs    08/15/20 0804 08/18/20 0101 11/05/20 0918 11/12/20 0000 11/19/20 1514 11/26/20 1431  INR 1.5*   < > 3.4* 2.6 3.9* 2.3  APTT 25  --   --   --   --   --    < > = values in this interval not displayed.    BMP: Recent Labs    02/14/20 0906 03/08/20 0913 03/20/20 1039 06/26/20 1040 08/13/20 1434 08/21/20 0042 08/23/20 0059 08/26/20 0048 09/16/20 1359  NA 142 139 137 139   < > 133* 134* 135 138  K 4.9 4.5 5.0 4.9   < > 4.6 4.4 3.9 4.3  CL 103 102 101 104   < > 102 99 101 102  CO2 23 25 26 26    < > 23 26 26 26   GLUCOSE 123* 110* 96 114   < > 111* 112* 115* 115*  BUN 33* 38* 31* 23   < > 24* 24* 19 15  CALCIUM 9.5 8.7 8.9 9.2   < > 8.5* 8.5* 8.3* 8.5*  CREATININE 1.37* 1.70* 1.49* 1.31*   < > 1.16* 1.21* 1.29* 1.28*  GFRNONAA 37* 29* 34* 39*   < > 49* 46* 43* 43*  GFRAA 43* 33* 39* 45*  --   --   --   --   --    < > = values in this interval not displayed.    LIVER FUNCTION TESTS: Recent Labs    12/14/19 1317 06/26/20 1040 08/13/20 1434 08/17/20 0027  BILITOT 0.9 0.9 0.8 0.7  AST 16 15 20 26   ALT 15 10 16 11   ALKPHOS  --   --  74 54  PROT 6.2 6.8 6.7 5.4*  ALBUMIN  --   --  3.5 2.7*    TUMOR MARKERS: No results for input(s): AFPTM, CEA, CA199, CHROMGRNA in the last 8760 hours.  Assessment and Plan: Patient with past medical history of neuroendocrine tumor of the lung presents with complaint of subcutaneous nodules, shoulder lesion.  IR consulted for  biopsy at the request of Dr. Lamonte Richer. Case reviewed by Dr. Anselm Pancoast who approves patient for biopsy of the right hip subcutaneous nodule.  Patient presents today in their usual state of health. She reports nausea and is given IV Zofran prior to procedure.  She has been NPO and is currently on coumadin.   Risks and benefits of biopsy was discussed with the patient  and/or patient's family including, but not limited to bleeding, infection, damage to adjacent structures or low yield requiring additional tests.  All of the questions were answered and there is agreement to proceed.  Consent signed and in chart.   Thank you for this interesting consult.  I greatly enjoyed meeting Katelyn [name] and look forward to participating in their care.  A copy of this report was sent to the requesting provider on this date.  Electronically Signed: Docia Barrier, PA 11/27/2020, 12:45 PM   I spent a total of  30 Minutes   in face to face in clinical consultation, greater than 50% of which was counseling/coordinating care for neuroendocrine cancer.

## 2020-11-27 NOTE — Progress Notes (Signed)
Pt ambulated without difficulty or bleeding.   Discharged home with granddaughter who will drive and stay with pt x 24 hrs

## 2020-11-29 LAB — SURGICAL PATHOLOGY

## 2020-12-03 ENCOUNTER — Ambulatory Visit (INDEPENDENT_AMBULATORY_CARE_PROVIDER_SITE_OTHER): Payer: Medicare Other | Admitting: Cardiovascular Disease

## 2020-12-03 DIAGNOSIS — Z7901 Long term (current) use of anticoagulants: Secondary | ICD-10-CM | POA: Diagnosis not present

## 2020-12-03 LAB — POCT INR: INR: 2.2 (ref 2.0–3.0)

## 2020-12-03 NOTE — Patient Instructions (Signed)
Description   Spoke with patient and instructed her to continue to take warfarin 1 tablet daily. Recheck INR in 1 week.

## 2020-12-09 ENCOUNTER — Other Ambulatory Visit: Payer: Self-pay | Admitting: Thoracic Surgery (Cardiothoracic Vascular Surgery)

## 2020-12-09 ENCOUNTER — Other Ambulatory Visit: Payer: Self-pay | Admitting: *Deleted

## 2020-12-09 DIAGNOSIS — J90 Pleural effusion, not elsewhere classified: Secondary | ICD-10-CM

## 2020-12-10 ENCOUNTER — Other Ambulatory Visit: Payer: Self-pay

## 2020-12-10 ENCOUNTER — Ambulatory Visit
Admission: RE | Admit: 2020-12-10 | Discharge: 2020-12-10 | Disposition: A | Discharge status: Home / Self Care | Payer: Medicare Other | Source: Ambulatory Visit | Attending: Thoracic Surgery (Cardiothoracic Vascular Surgery) | Admitting: Thoracic Surgery (Cardiothoracic Vascular Surgery)

## 2020-12-10 ENCOUNTER — Ambulatory Visit (INDEPENDENT_AMBULATORY_CARE_PROVIDER_SITE_OTHER): Payer: Medicare Other | Admitting: Medical

## 2020-12-10 ENCOUNTER — Ambulatory Visit (INDEPENDENT_AMBULATORY_CARE_PROVIDER_SITE_OTHER): Payer: Medicare Other | Admitting: Cardiology

## 2020-12-10 ENCOUNTER — Ambulatory Visit (INDEPENDENT_AMBULATORY_CARE_PROVIDER_SITE_OTHER): Payer: Medicare Other | Admitting: Thoracic Surgery (Cardiothoracic Vascular Surgery)

## 2020-12-10 VITALS — BP 190/80 | HR 56 | Ht 70.0 in | Wt 155.6 lb

## 2020-12-10 VITALS — BP 167/64 | HR 67 | Resp 20 | Ht 70.0 in | Wt 156.0 lb

## 2020-12-10 DIAGNOSIS — I480 Paroxysmal atrial fibrillation: Secondary | ICD-10-CM | POA: Diagnosis not present

## 2020-12-10 DIAGNOSIS — Z7901 Long term (current) use of anticoagulants: Secondary | ICD-10-CM | POA: Diagnosis not present

## 2020-12-10 DIAGNOSIS — Z8709 Personal history of other diseases of the respiratory system: Secondary | ICD-10-CM | POA: Diagnosis not present

## 2020-12-10 DIAGNOSIS — Z902 Acquired absence of lung [part of]: Secondary | ICD-10-CM | POA: Diagnosis not present

## 2020-12-10 DIAGNOSIS — I739 Peripheral vascular disease, unspecified: Secondary | ICD-10-CM | POA: Diagnosis not present

## 2020-12-10 DIAGNOSIS — E785 Hyperlipidemia, unspecified: Secondary | ICD-10-CM

## 2020-12-10 DIAGNOSIS — I10 Essential (primary) hypertension: Secondary | ICD-10-CM

## 2020-12-10 DIAGNOSIS — Z952 Presence of prosthetic heart valve: Secondary | ICD-10-CM | POA: Diagnosis not present

## 2020-12-10 DIAGNOSIS — C3411 Malignant neoplasm of upper lobe, right bronchus or lung: Secondary | ICD-10-CM | POA: Diagnosis not present

## 2020-12-10 DIAGNOSIS — I701 Atherosclerosis of renal artery: Secondary | ICD-10-CM | POA: Diagnosis not present

## 2020-12-10 DIAGNOSIS — Z5181 Encounter for therapeutic drug level monitoring: Secondary | ICD-10-CM | POA: Diagnosis not present

## 2020-12-10 DIAGNOSIS — J90 Pleural effusion, not elsewhere classified: Secondary | ICD-10-CM

## 2020-12-10 DIAGNOSIS — I2510 Atherosclerotic heart disease of native coronary artery without angina pectoris: Secondary | ICD-10-CM | POA: Diagnosis not present

## 2020-12-10 DIAGNOSIS — I350 Nonrheumatic aortic (valve) stenosis: Secondary | ICD-10-CM

## 2020-12-10 LAB — POCT INR: INR: 2.6 (ref 2.0–3.0)

## 2020-12-10 NOTE — Progress Notes (Signed)
Katelyn [name], [place] [id number]   CLINIC:  Medical Oncology/Hematology  PCP:  Katelyn Squibb, MD [address] Katelyn [name] [place] [address] [phone]   REASON FOR VISIT:  Follow-up for right lung cancer  PRIOR THERAPY: Right upper lobectomy (08/15/20)  NGS Results: not done  CURRENT THERAPY: surveillance  BRIEF ONCOLOGIC HISTORY:  Oncology History Overview Note  Foundation One PD-L1 TPS score 1 16 mut/MB KRASG12C mutation MSI stable   Cancer of upper lobe of right lung (Katelyn [name])  08/15/2020 Pathology Results   FINAL MICROSCOPIC DIAGNOSIS:   A. 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  - Tumor is limited to lung.  - Margins are negative for carcinoma.  - One of one lymph nodes negative for carcinoma (0/1).  - See oncology table.   B. LYMPH NODE, 7, EXCISION:  - One of one lymph nodes negative for carcinoma (0/1).   C. LYMPH NODE, 7 #2, EXCISION:  - One of one lymph nodes negative for carcinoma (0/1).   D. LYMPH NODE, 9, EXCISION:  - One of one lymph nodes negative for carcinoma (0/1).   E. LYMPH NODE, 8, EXCISION:  - One of one lymph nodes negative for carcinoma (0/1).   F. LYMPH NODE, 7 #3, EXCISION:  - One of one lymph nodes negative for carcinoma (0/1).   G. LYMPH NODE, 11, EXCISION:  - One of one lymph nodes negative for carcinoma (0/1).   H. LYMPH NODE, 10, EXCISION:  - One of one lymph nodes negative for carcinoma (0/1).   I. LYMPH NODE, 4R, EXCISION:  - One of one lymph nodes negative for carcinoma (0/1).   J. LYMPH NODE, 10 #2, EXCISION:   - One of one lymph nodes negative for carcinoma (0/1).  - Fibrosis and calcifications.   K. LYMPH NODE, 12, EXCISION:  - One of one lymph nodes negative for carcinoma (0/1).   L. LYMPH NODE, 12 #2, EXCISION:  - One of one lymph nodes negative for carcinoma (0/1).   ONCOLOGY TABLE:   LUNG: Resection   Synchronous  Tumors: Not applicable  Total Number of Primary Tumors: 1  Procedure: Right upper lobe lobectomy with lymph node biopsies  Specimen Laterality: Right  Tumor Focality: Unifocal  Tumor Site: Right upper lobe  Tumor Size: 4.4 cm  Histologic Type: Large cell neuroendocrine carcinoma  Visceral Pleura Invasion: Not identified  Direct Invasion of Adjacent Structures: No adjacent structures present  Lymphovascular Invasion: Not identified  Margins: All margins negative for invasive carcinoma       Closest Margin(s) to Invasive Carcinoma: 1.5 cm to bronchial margin  Treatment Effect: No known presurgical therapy  Regional Lymph Nodes:       Number of Lymph Nodes Involved: 0                            Nodal Sites with Tumor: Not applicable       Number of Lymph Nodes Examined: 12                       Nodal Sites Examined: 4, 7, 8, 9, 10, 11, 12  Distant Metastasis:       Distant Site(s) Involved: Not applicable  Pathologic Stage Classification (pTNM, AJCC 8th Edition): pT2b, pN0  Ancillary Studies: Can be performed upon request  Representative Tumor Block: A2  Comment(s):  The tumor has a subtle neuroendocrine features, cribriform areas with necrosis, and geographic areas of necrosis.  Immunohistochemistry is positive for CD56, synaptophysin, and TTF-1.  Chromogranin is largely negative. Ki-67 is elevated. Dr. Saralyn [name] has reviewed the case.     08/15/2020 Surgery   PREOPERATIVE DIAGNOSIS:  Right upper lobe lung mass, suspected non-small cell carcinoma.   POSTOPERATIVE DIAGNOSIS:  Non-small cell carcinoma, right upper lobe, clinical stage IB (T2, N0).   PROCEDURE:   Xi robotic-assisted right upper lobectomy,  Lymph node dissection,  Intercostal nerve blocks levels 3 through 10.   SURGEON:  Katelyn Charon, MD    FINDINGS:  Fragile tissue.  Enlarged but otherwise benign appearing nodes.  One calcified node.  Frozen section revealed non-small cell carcinoma.  Bronchial margin clear.    09/04/2020 Initial Diagnosis   Cancer of upper lobe of right lung (Katelyn [name])   09/04/2020 Cancer Staging   Staging form: Lung, AJCC 8th Edition - Pathologic stage from 09/04/2020: pT2, pN0, cM0 - Signed by Katelyn Lark, MD on 09/04/2020 Stage prefix: Initial diagnosis   09/11/2020 Genetic Testing   PD-L1 Results:       CANCER STAGING: Cancer Staging Cancer of upper lobe of right lung [facility]) Staging form: Lung, AJCC 8th Edition - Pathologic stage from 09/04/2020: pT2, pN0, cM0 - Signed by Katelyn Lark, MD on 09/04/2020   INTERVAL HISTORY:  Katelyn [name], a 80 y.o. female, returns for routine follow-up of her right lung cancer. Katelyn [name] was last seen on 11/13/2020.   Today she reports feeling poorly. She reports severe constant pain in her right shoulder and a knot on her right hip that is still present and painful following biopsy. She also reports a painful knot on her left buttock. The pain has prevented her from doing many of her typical home activities. She denies SOB.   REVIEW OF SYSTEMS:  Review of Systems  Constitutional:  Positive for appetite change (no appetite) and fatigue (depleted).  Respiratory:  Positive for cough. Negative for shortness of breath.   Musculoskeletal:  Positive for arthralgias (6/10 R shoulder) and myalgias (left buttock).  Neurological:  Positive for dizziness.  All other systems reviewed and are negative.  PAST MEDICAL/SURGICAL HISTORY:  Past Medical History:  Diagnosis Date   Allergy    rhinitis   Aortic stenosis, severe    a. s/p TAVR 08/2014.   Arthritis    spine and various joints   Cancer of upper lobe of right lung ([place]) 09/04/2020   Carotid artery disease ([place])    Cataract    Cerebrovascular disease 12/05/2018   Childhood asthma    Chronic bronchitis ([place])        Claudication ([place])    COPD GOLD II 07/18/2020   Coronary artery disease    a. April 2012 which revealed mid RCA occlusion with collaterals, 50% LAD stenosis, 30% circumflex stenosis,  and 40% marginal stenosis   First degree AV block    GERD (gastroesophageal reflux disease)    Heart murmur    Hyperlipidemia    Hypertension    Left bundle branch block    LUMBAR RADICULOPATHY, RIGHT    PAF (paroxysmal atrial fibrillation) (H[facility])    Peripheral vascular disease ([place])    a. s/p aortobifem bypass graft surgery and bilateral fundoplasty by [facility].   Renal artery stenosis ([place])    a. renal artery stenosis (>60% L renal artery) by duplex 04/2020.   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 bilateral subclavian stenosis with antegrade flow by duplex 07/2020   Past Surgical History:  Procedure Laterality Date   AORTO-FEMORAL BYPASS GRAFT Bilateral 06/27/10   BASAL CELL CARCINOMA EXCISION Left    face   CARDIAC CATHETERIZATION N/A 07/25/2014   Procedure: Right/Left Heart Cath and Coronary Angiography;  Surgeon: Troy Sine, MD;  Location: [facility];  Service: Cardiovascular;  Laterality: N/A;   CATARACT EXTRACTION, BILATERAL Bilateral 09/29/2016   COLONOSCOPY  2008   Dr. Laural Golden: normal.    DILATION AND CURETTAGE OF UTERUS  "2 or 3"   ESOPHAGOGASTRODUODENOSCOPY N/A 08/18/2017   Dr. Gala Romney: Normal-appearing esophagus status post dilation.  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  Small hiatal hernia.   INTERCOSTAL NERVE BLOCK Right 08/15/2020   Procedure: INTERCOSTAL NERVE BLOCK RIGHT;  Surgeon: Melrose Nakayama, MD;  Location: [facility];  Service: Thoracic;  Laterality: Right;   IR THORACENTESIS ASP PLEURAL SPACE W/IMG GUIDE  10/07/2020   MALONEY DILATION N/A 08/18/2017   Procedure: Venia Minks DILATION;  Surgeon: Daneil Dolin, MD;  [facility];  Service: Endoscopy;  Laterality: N/A;    MULTIPLE TOOTH EXTRACTIONS     NODE DISSECTION Right 08/15/2020   Procedure: NODE DISSECTION;  Surgeon: Melrose Nakayama, MD;  Location: [facility];  Service: Thoracic;  Laterality: Right;   PR VEIN BYPASS GRAFT,AORTO-FEM-POP  06/12/10   TEE WITHOUT CARDIOVERSION N/A 08/31/2014   Procedure: TRANSESOPHAGEAL ECHOCARDIOGRAM (TEE);  Surgeon: Rexene Alberts, MD;  Location: [facility];  Service: Open Heart Surgery;  Laterality: N/A;   TRANSCATHETER AORTIC VALVE REPLACEMENT, TRANSFEMORAL N/A 08/31/2014   Procedure: TRANSCATHETER AORTIC VALVE REPLACEMENT, TRANSFEMORAL approach;  Surgeon: Rexene Alberts, MD;  Location: [facility];  Service: Open Heart Surgery;  Laterality: N/A;   TUBAL LIGATION  1970's   VAGINAL HYSTERECTOMY  1970's   Partial     SOCIAL HISTORY:  Social History   Socioeconomic History   Marital status: Widowed    Spouse name: bobby   Number of children: 1   Years of education: Not on file   Highest education level: High school graduate  Occupational History   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 Retired,used to work at [employer].Lives alone.         Patient has one daughter, 3 grands   Likes to crochet   Works in [employer].    Social Determinants of Health   Financial Resource Strain: Not on file  Food Insecurity: Not on file  Transportation Needs: Not on file  Physical Activity: Not on file  Stress: Not on  file  Social Connections: Not on file  Intimate Partner Violence: Not on file     FAMILY HISTORY:  Family History  Problem Relation Age of Onset   Heart disease Mother 33       died young   Varicose Veins Mother    Stroke Father    Hypertension Father    Heart disease Father 76       Aneurysm   Heart attack Father    Arthritis Brother    Stroke Brother    Diabetes Daughter    Arthritis Daughter    Heart attack Brother        unsure   Allergic rhinitis Neg Hx    Angioedema Neg Hx    Asthma Neg Hx    Atopy Neg Hx    Eczema Neg Hx    Immunodeficiency Neg Hx    Urticaria Neg Hx     CURRENT MEDICATIONS:  Current Outpatient Medications  Medication Sig Dispense Refill   amiodarone (PACERONE) 200 MG tablet Take 1 tablet by mouth once daily 90 tablet 3   chlorthalidone (HYGROTON) 25 MG tablet Take 12.5 mg by mouth daily.     Cholecalciferol (VITAMIN D3) 50 MCG (2000 UT) capsule Take 2,000 Units by mouth daily. (Patient not taking: Reported on 12/10/2020)     estradiol (ESTRACE) 2 MG tablet Take 1 tablet (2 mg total) by mouth daily. 90 tablet 0   furosemide (LASIX) 20 MG tablet Take 1 tablet (20 mg total) by mouth daily. 30 tablet 1   ipratropium (ATROVENT) 0.06 % nasal spray USE 1 SPRAY(S) IN EACH NOSTRIL EVERY 6 HOURS AS NEEDED FOR RUNNY NOSE 15 mL 2   irbesartan (AVAPRO) 300 MG tablet Take 300 mg by mouth daily.     meloxicam (MOBIC) 15 MG tablet Take 1 tablet by mouth once daily 30 tablet 3   metoCLOPramide (REGLAN) 5 MG tablet Take 2 tablets (10 mg total) by mouth 3 (three) times daily before meals. 30 tablet 3   nitroGLYCERIN (NITROSTAT) 0.4 MG SL tablet Place 1 tablet (0.4 mg total) under the tongue every 5 (five) minutes as needed for chest pain. 25 tablet 12   pantoprazole (PROTONIX) 40 MG tablet TAKE 1 TABLET BY MOUTH ONCE DAILY BEFORE SUPPER (Patient taking differently: Take 40 mg by mouth daily.) 90 tablet 3   potassium chloride (KLOR-CON) 10 MEQ tablet Take 1 tablet (10 mEq total) by mouth daily. 14 tablet 1   rosuvastatin (CRESTOR) 20 MG tablet  Take 1 tablet (20 mg total) by mouth at bedtime. 90 tablet 0   warfarin (COUMADIN) 2.5 MG tablet Take 1 tablet (2.5 mg total) by mouth daily. 90 tablet 0   No current facility-administered medications for this visit.    ALLERGIES:  Allergies  Allergen Reactions   Adhesive [Tape] Rash   Latex Rash   Tetanus Toxoids Rash    PHYSICAL EXAM:  Performance status (ECOG): 2 - Symptomatic, <50% confined to bed  There were no vitals filed for this visit. Wt Readings from Last 3 Encounters:  12/10/20 156 lb (70.8 kg)  12/10/20 155 lb 9.6 oz (70.6 kg)  11/27/20 159 lb (72.1 kg)   Physical Exam Vitals reviewed.  Constitutional:      Appearance: Normal appearance.  Cardiovascular:     Rate and Rhythm: Normal rate and regular rhythm.     Pulses: Normal pulses.     Heart sounds: Normal heart sounds.  Pulmonary:     Effort: Pulmonary effort is normal.  Breath sounds: Normal breath sounds.  Musculoskeletal:     Comments: Right buttock subcutaneous nodule  Neurological:     General: No focal deficit present.     Mental Status: She is alert and oriented to person, place, and time.  Psychiatric:        Mood and Affect: Mood normal.        Behavior: Behavior normal.     LABORATORY DATA:  I have reviewed the labs as listed.  CBC Latest Ref Rng & Units 09/16/2020 08/26/2020 08/23/2020  WBC 4.0 - 10.5 K/uL 7.3 11.9(H) 9.9  Hemoglobin 12.0 - 15.0 g/dL 14.1 12.3 12.4  Hematocrit 36.0 - 46.0 % 45.2 37.1 38.3  Platelets 150 - 400 K/uL 146(L) 191 199   CMP Latest Ref Rng & Units 09/16/2020 08/26/2020 08/23/2020  Glucose 70 - 99 mg/dL 115(H) 115(H) 112(H)  BUN 8 - 23 mg/dL 15 19 24(H)  Creatinine 0.44 - 1.00 mg/dL 1.28(H) 1.29(H) 1.21(H)  Sodium 135 - 145 mmol/L 138 135 134(L)  Potassium 3.5 - 5.1 mmol/L 4.3 3.9 4.4  Chloride 98 - 111 mmol/L 102 101 99  CO2 22 - 32 mmol/L _0 Calcium 8.9 - 10.3 mg/dL 8.5(L) 8.3(L) 8.5(L)  Total Protein 6.5 - 8.1 g/dL - - -  Total Bilirubin 0.3 - 1.2  mg/dL - - -  Alkaline Phos 38 - 126 U/L - - -  AST 15 - 41 U/L - - -  ALT 0 - 44 U/L - - -    DIAGNOSTIC IMAGING:  I have independently reviewed the scans and discussed with the patient. DG Chest 2 View  Result Date: 12/10/2020 CLINICAL DATA:  Pleural effusion. EXAM: CHEST - 2 VIEW COMPARISON:  11/05/2020 FINDINGS: Hyperexpanded lungs with redemonstrated right effusion, which has decreased compared to the prior exam. No other focal pulmonary opacity. No left pleural effusion. Cardiac contours within normal limits. No acute osseous abnormality. IMPRESSION: Interval decrease in the size of the previously noted right pleural effusion, now small. Electronically Signed   By: Merilyn Baba M.D.   On: 12/10/2020 15:47   MR SHOULDER RIGHT W WO CONTRAST  Result Date: 11/13/2020 CLINICAL DATA:  Right shoulder pain, on PET-CT there is evidence of a hypermetabolic lesion in the vicinity of the right scapula. EXAM: MRI OF THE RIGHT SHOULDER WITHOUT AND WITH CONTRAST TECHNIQUE: Multiplanar, multisequence MR imaging of the the right shoulder was performed before and after the administration of intravenous contrast. CONTRAST:  43m GADAVIST GADOBUTROL 1 MMOL/ML IV SOLN COMPARISON:  PET-CT 10/01/2020 FINDINGS: Despite efforts by the technologist and patient, motion artifact is present on today's exam and could not be eliminated. This reduces exam sensitivity and specificity. Rotator cuff:  No rotator cuff tear identified. Muscles: Interposed between the anterior supraspinatus muscle, the coracoclavicular ligament, and the coracoid on image 19 of series 8, a 2.2 by 2.0 by 2.8 cm enhancing mass with mild central necrosis is present. This abuts the periosteal margin of the base of the coracoid with some subtle edema and endosteal enhancement which is likely reactive; I do not see definite cortical destruction to suggest that the coracoid is overtly invaded. The lesion causes a concave anterior margin of the supraspinatus  muscle and there is edema and enhancement tracking along adjacent fascia planes. Biceps long head:  Unremarkable Acromioclavicular Joint: Moderate spurring and mild subcortical marrow edema compatible with moderate degenerative AC joint arthropathy. Type I acromion. There is only trace fluid in the subacromial subdeltoid bursa. Glenohumeral Joint:  Unremarkable Labrum:  Grossly unremarkable Bones: As noted above, the lesion of concern is primarily in the soft tissues along the posterosuperior margin of the coracoid, with only subtle endosteal edema and enhancement adjacent to the dominant lesion. Other: Right pleural effusion. IMPRESSION: 1. The hypermetabolic lesion along the right shoulder turns out to be in the soft tissues along the anterior supraspinatus muscle, bounded anteriorly by the coracoclavicular ligament and inferiorly by the coracoid. This lesion measures 2.2 by 2.0 by 2.8 cm and demonstrates high T2 signal and diffuse enhancement aside from what appears to be some central necrosis, with surrounding edema and enhancement tracking along regional fascia planes. There is some subtle and likely reactive edema along the end ostium of the base of the coracoid adjacent to the lesion but no definite bony destructive findings at this time. 2. Moderate degenerative AC joint arthropathy. 3. Trace subacromial subdeltoid bursitis. 4. Right pleural effusion. Electronically Signed   By: Van Clines M.D.   On: 11/13/2020 08:14   [place] CORE BIOPSY (SOFT TISSUE)  Result Date: 11/27/2020 INDICATION: 80 year old with history of large cell neuroendocrine tumor and status post right upper lobectomy. PET-CT imaging raises concern for metastatic disease. Concern for a lesion in the right shoulder and indeterminate subcutaneous nodules. EXAM: ULTRASOUND-GUIDED CORE BIOPSY OF RIGHT PELVIC SUBCUTANEOUS NODULE MEDICATIONS: None. ANESTHESIA/SEDATION: Moderate (conscious) sedation was employed during this procedure. A total  of Versed 1.0 mg and Fentanyl 50 mcg was administered intravenously. Moderate Sedation Time: 16 minutes. The patient's level of consciousness and vital signs were monitored continuously by radiology nursing throughout the procedure under my direct supervision. FLUOROSCOPY TIME:  None COMPLICATIONS: None immediate. PROCEDURE: Informed written consent was obtained from the patient after a thorough discussion of the procedural risks, benefits and alternatives. All questions were addressed. A timeout was performed prior to the initiation of the procedure. The area of concern in the right shoulder was evaluated with ultrasound but a lesion was not confidently identified. A focal heterogeneous nodule was identified in the lateral right pelvic subcutaneous tissues. This corresponds with the hypermetabolic lesion on previous PET-CT imaging. Right side of the pelvis was prepped with chlorhexidine. Sterile field was created. Skin and soft tissues were anesthetized with 1% lidocaine. Small incision was made. Using ultrasound guidance, an 18 gauge core needle was directed into the subcutaneous nodule. Total of 6 core biopsies were obtained. Specimens placed in formalin. Bandage placed over the puncture site. FINDINGS: Heterogeneous nodule in the right lateral pelvic subcutaneous tissues. Nodule measures 2.3 x 1.8 x 2.6 cm. IMPRESSION: Ultrasound-guided core biopsies of a subcutaneous nodule along the lateral right pelvis. Electronically Signed   By: Markus Daft M.D.   On: 11/27/2020 17:51     ASSESSMENT:  1.  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 - She reported COVID infection in December 2020 and has been fatigued since then. - Chest x-ray on 06/21/2020 showed masslike opacity in the right lung. - CT chest with contrast on 06/28/2020 showed right upper lobe lung mass measuring 4 x 3.2 x 3.8 cm.  Small noncalcified mediastinal lymph  nodes are all subcentimeter, nonspecific.  Small subpleural nodules in the right upper lobe measure up to 5 mm.  Clustered lingular nodules measuring up to 6 mm nonspecific.  Nonspecific 8 mm enhancing focus in the left lobe of the liver. - Right upper lobectomy and lymph node dissection on 08/15/2020. - Pathology consistent with  4.4 cm large cell neuroendocrine carcinoma, margins negative.  Lymph nodes at stations 7, 8, 9, 10, 11, 4R, 12 negative for metastatic disease.  No visceral pleural invasion or direct invasion of the adjacent structures.  No lymphovascular invasion.  IHC positive for CD56, synaptophysin and TTF-1.  Chromogranin negative.  Ki-67 elevated. - PET scan on 10/01/2020 with postoperative changes in the chest wall.  Intense hyper metabolism in the acromion with SUV 8.6.  Hypermetabolic focus in the right gluteal region favoring injection site. - Foundation 1 results showed TMB high, K-ras G12C mutation positive. - PD-L1 TPS 1%. - Right lateral pelvic soft tissue biopsy on 11/27/2020 consistent with metastatic neuroendocrine carcinoma, Ki-67 40-50%.   2.  Social/family history: - She lives by herself at home and is independent of ADLs and IADLs. - She smoked 2 packs/day for 56 years and quit in May 2012. - She worked in [employer]. - Her nephew had colon cancer.  No other malignancies in the family.   PLAN:  1.  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 - We discussed the treatment option with chemoimmunotherapy. - We will reach out to pathology to see if this identifies more with non-small cell or small cell carcinoma. - In the interest of time we will proceed with B12 injection and started on folic acid. - She will require Port-A-Cath placement. - We will likely start her on treatment in the  next 1 to 2 weeks.   2.  Shortness of breath on exertion: - Status post right thoracentesis on 10/07/2020 with 1.7 L of clear fluid removed. - Breathing has improved.  3.  Nausea: - Continue Reglan 5 mg 3 times daily AC which is helping.  4.  Multiple subcutaneous nodules: - She has developed multiple subcutaneous nodules in the lower back which are consistent with malignancy.  5.  Right shoulder pain: - She is having difficulty with her shoulder pain and has only 1 position where she can find comfort. - We will start her on oxycodone 5 mg every 8 hours as needed.  She was instructed to take stool softener. - We will make a referral to radiation for better pain control.   Orders placed this encounter:  No orders of the defined types were placed in this encounter.    Derek Jack, MD [place] [phone]   I, Thana Ates, am acting as a scribe for Dr. Derek Jack.  I, Derek Jack MD, have reviewed the above documentation for accuracy and completeness, and I agree with the above.

## 2020-12-10 NOTE — Patient Instructions (Signed)
Description   Spoke with patient and instructed her to continue to take warfarin 1 tablet daily. Recheck INR in 1 week.

## 2020-12-10 NOTE — Patient Instructions (Signed)
Medication Instructions:  The current medical regimen is effective;  continue present plan and medications as directed. Please refer to the Current Medication list given to you today.   *If you need a refill on your cardiac medications before your next appointment, please call your pharmacy*  Lab Work:    NONE      Testing/Procedures:  Echocardiogram - Your physician has requested that you have an echocardiogram. Echocardiography is a painless test that uses sound waves to create images of your heart. It provides your doctor with information about the size and shape of your heart and how well your heart's chambers and valves are working. This procedure takes approximately one hour. There are no restrictions for this procedure. This will be performed at our [facility] location - [address], [address].  Follow-Up: Your next appointment:  6 month(s) In Person with You may see Shelva Majestic, MD or one of the following Advanced Practice Providers on your designated Care Team:   Almyra Deforest, PA-C Fabian Sharp, PA-C or   At [name], you and your health needs are our priority.  As part of our continuing mission to provide you with exceptional heart care, we have created designated Provider Care Teams.  These Care Teams include your primary Cardiologist (physician) and Advanced Practice Providers (APPs -  Physician Assistants and Nurse Practitioners) who all work together to provide you with the care you need, when you need it.

## 2020-12-10 NOTE — Progress Notes (Signed)
[address]       [place] [address]             [phone]     HPI: Katelyn [name] returns for a scheduled follow-up visit  Katelyn [name] is a 80 year old woman with a history of tobacco abuse, COPD, lung cancer, right upper lobectomy, aortic stenosis, TAVR, thoracic aortic atherosclerotic disease, coronary disease, PAD, stroke, atrial fibrillation, hypertension, hyperlipidemia, arthritis, and reflux.  She presented with a cough earlier this year and was found to have a 4 cm right upper lobe lung mass.  I did a robotic right upper lobectomy on 08/15/2020.  She had a T2b, N0, stage IIa large cell neuroendocrine tumor.  She had a complicated course with an air leak postoperatively and had a lot of difficulty with weakness and nausea.  She did not do chemotherapy because she felt too weak to undergo it.  She developed a pleural effusion.  We did a thoracentesis on 10/07/2020 and drained about 1.7 L of serous fluid.  Her breathing did improve after that.  I saw her back in the office on 11/05/2020 and there was still some residual fluid.  I started her on Lasix 20 mg daily.  She is currently feeling well.  Her biggest complaint is right shoulder pain.  She had an MRI of that.  She also had some type of muscle or soft tissue biopsy of her hip recently.  She says her breathing is much better and she is not having any current problems with that.  Past Medical History:  Diagnosis Date   Allergy    rhinitis   Aortic stenosis, severe    a. s/p TAVR 08/2014.   Arthritis    spine and various joints   Cancer of upper lobe of right lung ([place]) 09/04/2020   Carotid artery disease ([place])    Cataract    Cerebrovascular disease 12/05/2018   Childhood asthma    Chronic bronchitis ([place])        Claudication ([place])    COPD GOLD II 07/18/2020   Coronary artery disease    a. April 2012 which revealed mid RCA occlusion with collaterals, 50% LAD stenosis, 30% circumflex stenosis, and 40% marginal  stenosis   First degree AV block    GERD (gastroesophageal reflux disease)    Heart murmur    Hyperlipidemia    Hypertension    Left bundle branch block    LUMBAR RADICULOPATHY, RIGHT    PAF (paroxysmal atrial fibrillation) (HCC)    Peripheral vascular disease ([place])    a. s/p aortobifem bypass graft surgery and bilateral fundoplasty by V[facility].   Renal artery stenosis ([place])    a. renal artery stenosis (>60% L renal artery) by duplex 04/2020.   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 bilateral subclavian stenosis with antegrade flow by duplex 07/2020    Current Outpatient Medications  Medication Sig Dispense Refill   amiodarone (PACERONE) 200 MG tablet Take 1 tablet by mouth once daily 90 tablet 3   chlorthalidone (HYGROTON) 25 MG tablet Take 12.5 mg by mouth daily.     estradiol (ESTRACE) 2 MG tablet Take 1 tablet (2  mg total) by mouth daily. 90 tablet 0   furosemide (LASIX) 20 MG tablet Take 1 tablet (20 mg total) by mouth daily. 30 tablet 1   ipratropium (ATROVENT) 0.06 % nasal spray USE 1 SPRAY(S) IN EACH NOSTRIL EVERY 6 HOURS AS NEEDED FOR RUNNY NOSE 15 mL 2   irbesartan (AVAPRO) 300 MG tablet Take 300 mg by mouth daily.     meloxicam (MOBIC) 15 MG tablet Take 1 tablet by mouth once daily 30 tablet 3   metoCLOPramide (REGLAN) 5 MG tablet Take 2 tablets (10 mg total) by mouth 3 (three) times daily before meals. 30 tablet 3   pantoprazole (PROTONIX) 40 MG tablet TAKE 1 TABLET BY MOUTH ONCE DAILY BEFORE SUPPER (Patient taking differently: Take 40 mg by mouth daily.) 90 tablet 3   potassium chloride (KLOR-CON) 10 MEQ tablet Take 1 tablet (10 mEq total) by mouth daily. 14 tablet 1   rosuvastatin (CRESTOR) 20 MG  tablet Take 1 tablet (20 mg total) by mouth at bedtime. 90 tablet 0   warfarin (COUMADIN) 2.5 MG tablet Take 1 tablet (2.5 mg total) by mouth daily. 90 tablet 0   Cholecalciferol (VITAMIN D3) 50 MCG (2000 UT) capsule Take 2,000 Units by mouth daily. (Patient not taking: Reported on 12/10/2020)     nitroGLYCERIN (NITROSTAT) 0.4 MG SL tablet Place 1 tablet (0.4 mg total) under the tongue every 5 (five) minutes as needed for chest pain. 25 tablet 12   No current facility-administered medications for this visit.    Physical Exam BP (!) 167/64   Pulse 67   Resp 20   Ht 5\' 10"  (1.778 m)   Wt 156 lb (70.8 kg)   SpO2 99% Comment: RA  BMI 22.69 kg/m  80 year old woman in no acute distress Alert and oriented x3 with no focal deficits Lungs diminished at the right base but otherwise clear Cardiac irregular  Diagnostic Tests: CHEST - 2 VIEW   COMPARISON:  11/05/2020   FINDINGS: Hyperexpanded lungs with redemonstrated right effusion, which has decreased compared to the prior exam. No other focal pulmonary opacity. No left pleural effusion. Cardiac contours within normal limits. No acute osseous abnormality.   IMPRESSION: Interval decrease in the size of the previously noted right pleural effusion, now small.     Electronically Signed   By: Merilyn Baba M.D.   On: 12/10/2020 15:47 I personally reviewed the chest x-ray images.  There has been improvement of the right pleural effusion.  I suspect most of the volume loss there is just due to her lobectomy and there is minimal fluid present.  Impression: Katelyn [name] is a 80 year old woman with a history of tobacco abuse, COPD, lung cancer, right upper lobectomy, aortic stenosis, TAVR, thoracic aortic atherosclerotic disease, coronary disease, PAD, stroke, atrial fibrillation, hypertension, hyperlipidemia, arthritis, and reflux.  She had a right upper lobectomy for a stage IIa large cell neuroendocrine tumor in June 2022.  She had a  persistent air leak initially but that ultimately resolved.  She had a lot of difficulty with nausea and general malaise.  That has slowly improved over time as well.  She had a pleural effusion that required thoracentesis back in early August.  On follow-up chest x-ray the effusion was better but there was still some fluid present, so I started her on some Lasix.  Now a month later her x-ray has improved significantly.  She does have volume loss on the right side.  I suspect that is mostly as a result of  her lobectomy.  If there is any fluid at this point its minimal and filling the space.  Plan: Follow-up with Dr. Delton Coombes tomorrow as scheduled Return in 3 months with PA and lateral chest x-ray  Melrose Nakayama, MD [facility] [phone]

## 2020-12-10 NOTE — Progress Notes (Signed)
Cardiology Office Note   Date:  12/17/2020   ID:  Katelyn [name], DOB 09/12/1940, MRN [id number]  PCP:  Katelyn Squibb, MD  Cardiologist:  Katelyn Majestic, MD EP: None  Chief Complaint  Patient presents with   Follow-up    CAD, Afib, AS, HTN       History of Present Illness: Katelyn [name] is a 80 y.o. female with a PMH of CAD, PAD s/p aortobifemoral bypass graft in 2012, carotid artery disease, paroxysmal atrial fibrillation on coumadin, severe aortic stenosis s/p TAVR, chronic diastolic CHF, HTN, HLD, lung cancer s/p RUL lobectomy, who presents for routine follow-up.   She has a history of CAD with known CTO of RCA with L>R collaterals, otherwise non-obstructive disease on last LHC in 2016. She underwent TAVR in 2016 for management of severe aortic stenosis. Her post-op course was complicated by atrial fibrillation treated with amiodarone. She was last evaluated by cardiology at an outpatient visit with Katelyn [name] 04/29/20 at which time she was doing well from a cardiac standpoint. She previously worked with pharmacy to optimize her HTN medications. Her last echocardiogram 06/2019 showed EF 60-65%, moderate LVH, indeterminate LV diastolic function, no RWMA, moderately elevated PA pressures, moderate LAE, mild MR, and stable aortic valve s/p TAVR.   She presents today with her daughter for routine follow-up. She has been doing fairly well from a cardiac standpoint. She was admitted to the hospital 07/2020 for right upper lobectomy with lymph node dissection for management of large cell neuroendocrine tumor. Since her surgery she has been slowly regaining her strength/stamina. Thinks she has lost ~20lbs since her surgery. She has chronic DOE which is overall stable. No complaints of chest pain, orthopnea, PND, LE edema, dizziness, lightheadedness, or syncope. She is doing well with coumadin without complaints of bleeding. She has been having right shoulder pain for the past several months with  significant limitation in movement. Offered a referral to ortho, however she plans to talk to her oncologist about it tomorrow.      Past Medical History:  Diagnosis Date   Allergy    rhinitis   Aortic stenosis, severe    a. s/p TAVR 08/2014.   Arthritis    spine and various joints   Cancer of upper lobe of right lung ([place]) 09/04/2020   Carotid artery disease ([place])    Cataract    Cerebrovascular disease 12/05/2018   Childhood asthma    Chronic bronchitis ([place])        Claudication ([place])    COPD GOLD II 07/18/2020   Coronary artery disease    a. April 2012 which revealed mid RCA occlusion with collaterals, 50% LAD stenosis, 30% circumflex stenosis, and 40% marginal stenosis   First degree AV block    GERD (gastroesophageal reflux disease)    Heart murmur    Hyperlipidemia    Hypertension    Left bundle branch block    LUMBAR RADICULOPATHY, RIGHT    PAF (paroxysmal atrial fibrillation) (HCC)    Peripheral vascular disease ([place])    a. s/p aortobifem bypass graft surgery and bilateral fundoplasty by V[facility].   Renal artery stenosis ([place])    a. renal artery stenosis (>60% L renal artery) by duplex 04/2020.   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 bilateral subclavian stenosis with antegrade flow by duplex 07/2020    Past Surgical History:  Procedure Laterality Date   AORTO-FEMORAL BYPASS GRAFT Bilateral 06/27/10   BASAL CELL CARCINOMA EXCISION Left    face   CARDIAC CATHETERIZATION N/A 07/25/2014   Procedure: Right/Left Heart Cath and Coronary Angiography;  Surgeon: Troy Sine, MD;  Location: [facility];  Service: Cardiovascular;  Laterality: N/A;   CATARACT EXTRACTION, BILATERAL Bilateral  09/29/2016   COLONOSCOPY  2008   Dr. Laural Golden: normal.    DILATION AND CURETTAGE OF UTERUS  "2 or 3"   ESOPHAGOGASTRODUODENOSCOPY N/A 08/18/2017   Dr. Gala Romney: Normal-appearing esophagus status post dilation.  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  Small hiatal hernia.   INTERCOSTAL NERVE BLOCK Right 08/15/2020   Procedure: INTERCOSTAL NERVE BLOCK RIGHT;  Surgeon: Melrose Nakayama, MD;  Location: [facility];  Service: Thoracic;  Laterality: Right;   IR THORACENTESIS ASP PLEURAL SPACE W/IMG GUIDE  10/07/2020   MALONEY DILATION N/A 08/18/2017   Procedure: Venia Minks DILATION;  Surgeon: Daneil Dolin, MD;  [facility];  Service: Endoscopy;  Laterality: N/A;   MULTIPLE TOOTH EXTRACTIONS     NODE DISSECTION Right 08/15/2020   Procedure: NODE DISSECTION;  Surgeon: Melrose Nakayama, MD;  Location: [facility];  Service: Thoracic;  Laterality: Right;   PR VEIN BYPASS GRAFT,AORTO-FEM-POP  06/12/10   TEE WITHOUT CARDIOVERSION N/A 08/31/2014   Procedure: TRANSESOPHAGEAL ECHOCARDIOGRAM (TEE);  Surgeon: Rexene Alberts, MD;  Location: [facility];  Service: Open Heart Surgery;  Laterality: N/A;   TRANSCATHETER AORTIC VALVE REPLACEMENT, TRANSFEMORAL N/A 08/31/2014   Procedure: TRANSCATHETER AORTIC VALVE REPLACEMENT, TRANSFEMORAL approach;  Surgeon: Rexene Alberts, MD;  Location: [facility];  Service: Open Heart Surgery;  Laterality: N/A;   TUBAL LIGATION  1970's   VAGINAL HYSTERECTOMY  1970's   Partial      Current Outpatient Medications  Medication Sig Dispense Refill   amiodarone (PACERONE) 200 MG tablet Take 1 tablet by mouth once daily 90 tablet 3   chlorthalidone (HYGROTON) 25 MG tablet Take 12.5 mg by mouth daily.     Cholecalciferol (VITAMIN D3) 50 MCG (2000 UT) capsule Take 2,000 Units by mouth daily.     estradiol (ESTRACE) 2 MG tablet Take 1 tablet (2 mg total) by mouth daily. 90 tablet 0   furosemide (LASIX) 20 MG tablet Take 1 tablet (20 mg total) by mouth daily. 30 tablet 1   ipratropium  (ATROVENT) 0.06 % nasal spray USE 1 SPRAY(S) IN EACH NOSTRIL EVERY 6 HOURS AS NEEDED FOR RUNNY NOSE (Patient not taking: No sig reported) 15 mL 2   irbesartan (AVAPRO) 300 MG tablet Take 300 mg by mouth daily.     meloxicam (MOBIC) 15 MG tablet Take 1 tablet by mouth once daily 30 tablet 3   metoCLOPramide (REGLAN) 5 MG tablet Take 2 tablets (10 mg total) by mouth 3 (three) times daily before meals. 30 tablet 3   pantoprazole (PROTONIX) 40 MG tablet TAKE 1 TABLET BY MOUTH ONCE DAILY BEFORE SUPPER (Patient taking differently: Take 40 mg by mouth daily.) 90 tablet 3   potassium chloride (KLOR-CON) 10 MEQ tablet Take 1 tablet (10 mEq total) by mouth daily. 14 tablet 1   rosuvastatin (CRESTOR) 20 MG tablet Take 1 tablet (20 mg total) by mouth at bedtime. 90 tablet 0   warfarin (COUMADIN) 2.5 MG tablet  Take 1 tablet (2.5 mg total) by mouth daily. 90 tablet 0   CARBOPLATIN IV Inject into the vein every 21 ( twenty-one) days.     diltiazem (TIAZAC) 120 MG 24 hr capsule diltiazem CD 120 mg capsule,extended release 24 hr  TAKE 1 CAPSULE BY MOUTH ONCE DAILY     folic acid (FOLVITE) 1 MG tablet Take 1 tablet (1 mg total) by mouth daily. 30 tablet 0   lidocaine-prilocaine (EMLA) cream Apply a small amount to port a cath site and cover with plastic wrap 1 hour prior to infusion appointments 30 g 3   metoprolol succinate (TOPROL-XL) 25 MG 24 hr tablet Take by mouth.     nitroGLYCERIN (NITROSTAT) 0.4 MG SL tablet Place 1 tablet (0.4 mg total) under the tongue every 5 (five) minutes as needed for chest pain. 25 tablet 12   nitroGLYCERIN (NITROSTAT) 0.4 MG SL tablet nitroglycerin 0.4 mg sublingual tablet  DISSOLVE ONE TABLET UNDER THE TONGUE EVERY 5 MINUTES AS NEEDED FOR CHEST PAIN. DO NOT EXCEED A TOTAL OF 3 DOSES IN 15 MINUTES     ondansetron (ZOFRAN) 4 MG tablet ondansetron HCl 4 mg tablet  TAKE 1 TABLET BY MOUTH EVERY 8 HOURS AS NEEDED FOR NAUSEA OR VOMITING     oxyCODONE (OXY IR/ROXICODONE) 5 MG immediate  release tablet Take 1 tablet (5 mg total) by mouth every 8 (eight) hours. 30 tablet 0   PACLITAXEL IV Inject into the vein every 21 ( twenty-one) days.     Pembrolizumab (KEYTRUDA IV) Inject into the vein every 21 ( twenty-one) days.     prochlorperazine (COMPAZINE) 10 MG tablet Take 1 tablet (10 mg total) by mouth every 6 (six) hours as needed (Nausea or vomiting). 30 tablet 1   thyroid (ARMOUR) 30 MG tablet NP Thyroid 30 mg tablet  TAKE 60 MG BY MOUTH DAILY FOR 2 WEEKS, THEN TAKE 30 MG BY MOUTH DAILY FOR 2 WEEKS AND THEN STOP THE MEDICATION     No current facility-administered medications for this visit.    Allergies:   Adhesive [tape], Latex, Tetanus toxoids, and Wound dressing adhesive    Social History:  The patient  reports that she quit smoking about 10 years ago. Her smoking use included cigarettes. She started smoking about 68 years ago. She has a 112.00 pack-year smoking history. She has never used smokeless tobacco. She reports that she does not drink alcohol and does not use drugs.   Family History:  The patient's family history includes Arthritis in her brother and daughter; Diabetes in her daughter; Heart attack in her brother and father; Heart disease (age of onset: 71) in her mother; Heart disease (age of onset: 77) in her father; Hypertension in her father; Stroke in her brother and father; Varicose Veins in her mother.    ROS:  Please see the history of present illness.   Otherwise, review of systems are positive for none.   All other systems are reviewed and negative.    PHYSICAL EXAM: VS:  BP (!) 190/80 (BP Location: Left Arm, Patient Position: Sitting, Cuff Size: Normal)   Pulse (!) 56   Ht 5\' 10"  (1.778 m)   Wt 155 lb 9.6 oz (70.6 kg)   SpO2 96%   BMI 22.33 kg/m  , BMI Body mass index is 22.33 kg/m. GEN: Well nourished, well developed, in no acute distress HEENT: sclera anicteric Neck: no JVD, carotid bruits, or masses Cardiac: RRR; no murmurs, rubs, or gallops,  no edema  Respiratory:  clear to auscultation bilaterally, normal work of breathing GI: soft, nontender, nondistended, + BS MS: no deformity or atrophy; RUE with limited ROM Skin: warm and dry, no rash Neuro:  Strength and sensation are intact Psych: euthymic mood, full affect   EKG:  EKG is not ordered today.   Recent Labs: 12/17/2020: ALT 19; BUN 54; Creatinine, Ser 1.95; Hemoglobin 14.4; Magnesium 2.1; Platelets 210; Potassium 4.5; Sodium 134; TSH 6.764    Lipid Panel    Component Value Date/Time   CHOL 133 12/14/2019 1317   CHOL 126 11/21/2018 1146   TRIG 105 12/14/2019 1317   HDL 57 12/14/2019 1317   HDL 51 11/21/2018 1146   CHOLHDL 2.3 12/14/2019 1317   VLDL 40 (H) 01/16/2015 1028   LDLCALC 57 12/14/2019 1317      Wt Readings from Last 3 Encounters:  12/11/20 154 lb 1.6 oz (69.9 kg)  12/10/20 156 lb (70.8 kg)  12/10/20 155 lb 9.6 oz (70.6 kg)      Other studies Reviewed: Additional studies/ records that were reviewed today include:   Echocardiogram 06/2019: 1. Left ventricular ejection fraction, by estimation, is 60 to 65%. The  left ventricle has normal function. The left ventricle has no regional  wall motion abnormalities. There is moderate left ventricular hypertrophy.  Left ventricular diastolic  parameters are indeterminate.   2. Right ventricular systolic function is normal. The right ventricular  size is normal. There is moderately elevated pulmonary artery systolic  pressure.   3. Left atrial size was moderately dilated.   4. The mitral valve is normal in structure. Mild mitral valve  regurgitation. No evidence of mitral stenosis.   5. Post TAVR with 26 mm Sapien 3 valve stable gradients since 05/19/18 and  no PVL. The aortic valve has been repaired/replaced. Aortic valve  regurgitation is not visualized. No aortic stenosis is present.   6. The inferior vena cava is normal in size with greater than 50%  respiratory variability, suggesting right  atrial pressure of 3 mmHg.   R/LHC 2016: Mid RCA lesion, 100% stenosed. Ost 1st Diag lesion, 70% stenosed. Ost Cx lesion, 50% stenosed. Mid Cx lesion, 40% stenosed. Ost RCA lesion, 60% stenosed.   Normal LV function with an ejection fraction at a proximally 55% but with evidence for small focal region of mid to basal inferior            hypocontractility. 2+ angiographic mitral regurgitation with mean palmar E wedge pressure at 19 and V wave at 31 mmHg. Severe aortic valve stenosis with a peak to peak gradient of 46 mmHg, and a mean gradient of 39.5 mmHg.  Calculated aortic valve    area 0.64 cm. 1 - 2+ angiographic aortic regurgitation. Significant native coronary obstructive disease with evidence for coronary calcification and 70% proximal first diagonal stenosis; 50% proximal circumflex stenosis before the first marginal branch with 40% AV groove circumflex stenosis; 60% ostial narrowing of the RCA with 100% mid RCA occlusion after a marginal branch with bridging collaterals supplying the mid RCA extending from this marginal branch as well as left-to-right collaterals from the distal circumflex and LAD to the distal RCA.   RECOMMENDATION: Surgical consultation will be obtained for aortic valve replacement/CABG revascularization surgery.  ASSESSMENT AND PLAN:  1. CAD with known CTO of RCA: no recent anginal symptoms.  - Continue aspirin and statin - Continue Bblocker  2. Paroxysmal atrial fibrillation: no recent palpitations to suggest recurrent atrial fibrillation. She is tolerating coumadin without bleeding complaints -  Continue coumadin for stroke ppx - Continue amiodarone for rhythm control - Continue metoprolol and diltiazem for rate control  3. Aortic stenosis s/p TAVR in 2016: stable on last echo 06/2019.  - Will repeat echo for routine monitoring  4. HTN: BP elevated to 190/80 today - confirmed on repeat. She reports having "white coat syndrome" and that BP is much more  stable at home. - Will continue metoprolol, diltiazem, irbesartan, chlorthalidone, and lasix at current doses for now - Patient instructed to monitor BP closely over the next couple weeks and notify the office if BP consistently >130/80   5. Carotid artery disease: mild left and moderate right ICA stenosis on last dopplers 07/2020 - Continue annual surveillance monitoring - ordered for repeat dopplers 07/2020  6. PAD: s/p aortobifemoral bypass. Followed by Dr. Trula Slade.  - Continue routine follow-up with Vascular surgery  7. HLD: LDL 57 12/2019 - Continue crestor   Current medicines are reviewed at length with the patient today.  The patient does not have concerns regarding medicines.  The following changes have been made:  As above  Labs/ tests ordered today include:   Orders Placed This Encounter  Procedures   ECHOCARDIOGRAM COMPLETE     Disposition:   FU with Katelyn [name] in 6 months  Signed, Abigail Butts, PA-C  12/17/2020 11:07 PM

## 2020-12-11 ENCOUNTER — Other Ambulatory Visit (HOSPITAL_COMMUNITY): Payer: Self-pay | Admitting: *Deleted

## 2020-12-11 ENCOUNTER — Inpatient Hospital Stay (HOSPITAL_COMMUNITY): Payer: Medicare Other | Attending: Hematology | Admitting: Hematology

## 2020-12-11 VITALS — BP 167/65 | HR 62 | Temp 96.8°F | Resp 16 | Wt 154.1 lb

## 2020-12-11 DIAGNOSIS — C3490 Malignant neoplasm of unspecified part of unspecified bronchus or lung: Secondary | ICD-10-CM | POA: Diagnosis not present

## 2020-12-11 DIAGNOSIS — Z5111 Encounter for antineoplastic chemotherapy: Secondary | ICD-10-CM | POA: Diagnosis not present

## 2020-12-11 DIAGNOSIS — Z87891 Personal history of nicotine dependence: Secondary | ICD-10-CM | POA: Diagnosis not present

## 2020-12-11 DIAGNOSIS — C3411 Malignant neoplasm of upper lobe, right bronchus or lung: Secondary | ICD-10-CM | POA: Diagnosis not present

## 2020-12-11 DIAGNOSIS — C7A00 Malignant carcinoid tumor of unspecified site: Secondary | ICD-10-CM | POA: Insufficient documentation

## 2020-12-11 DIAGNOSIS — Z79899 Other long term (current) drug therapy: Secondary | ICD-10-CM | POA: Insufficient documentation

## 2020-12-11 DIAGNOSIS — C7A090 Malignant carcinoid tumor of the bronchus and lung: Secondary | ICD-10-CM | POA: Diagnosis not present

## 2020-12-11 MED ORDER — CYANOCOBALAMIN 1000 MCG/ML IJ SOLN
1000.0000 ug | Freq: Once | INTRAMUSCULAR | Status: AC
  Administered 2020-12-11: 1000 ug via INTRAMUSCULAR
  Filled 2020-12-11: qty 1

## 2020-12-11 MED ORDER — OXYCODONE HCL 5 MG PO TABS: 5.0000 mg | ORAL_TABLET | Freq: Three times a day (TID) | ORAL | 0 refills | Status: DC

## 2020-12-11 MED ORDER — FOLIC ACID 1 MG PO TABS: 1.0000 mg | ORAL_TABLET | Freq: Every day | ORAL | 0 refills | Status: DC

## 2020-12-11 NOTE — Patient Instructions (Signed)
Katelyn [name] at [facility] Discharge Instructions  You were seen and examined by Dr. Delton Coombes today.   We will refer you to [facility] for radiation for pain control.  A prescription for oxycodone for pain was sent to your pharmacy.  You may use the pain medication every 8 hours as needed for pain. If you are taking the pain medication regularly, please be sure to take a stool softener twice a day to help prevent constipation that this medication can cause.    Thank you for choosing [facility] at [facility] to provide your oncology and hematology care.  To afford each patient quality time with our provider, please arrive at least 15 minutes before your scheduled appointment time.   If you have a lab appointment with the [facility] please come in thru the Main Entrance and check in at the main information desk.  You need to re-schedule your appointment should you arrive 10 or more minutes late.  We strive to give you quality time with our providers, and arriving late affects you and other patients whose appointments are after yours.  Also, if you no show three or more times for appointments you may be dismissed from the clinic at the providers discretion.     Again, thank you for choosing [facility].  Our hope is that these requests will decrease the amount of time that you wait before being seen by our physicians.       _____________________________________________________________  Should you have questions after your visit to [facility], please contact our office at [phone] and follow the prompts.  Our office hours are 8:00 a.m. and 4:30 p.m. Monday - Friday.  Please note that voicemails left after 4:00 p.m. may not be returned until the following business day.  We are closed weekends and major holidays.  You do have access to a nurse 24-7, just call the main number to the clinic [phone] and do not press any  options, hold on the line and a nurse will answer the phone.    For prescription refill requests, have your pharmacy contact our office and allow 72 hours.    Due to Covid, you will need to wear a mask upon entering the hospital. If you do not have a mask, a mask will be given to you at the Main Entrance upon arrival. For doctor visits, patients may have 1 support person age 78 or older with them. For treatment visits, patients can not have anyone with them due to social distancing guidelines and our immunocompromised population.

## 2020-12-11 NOTE — Progress Notes (Signed)
Katelyn [name] presents today for injection per the provider's orders.  B12 administration without incident; injection site WNL; see MAR for injection details.  Patient tolerated procedure well and without incident.  No questions or complaints noted at this time. Discharged ambulatory in stable condition in c/o family.

## 2020-12-12 ENCOUNTER — Encounter (HOSPITAL_COMMUNITY): Payer: Self-pay | Admitting: Lab

## 2020-12-12 NOTE — Progress Notes (Unsigned)
Referral to [facility] Rad onc .  Records faxed on 10/13

## 2020-12-13 DIAGNOSIS — C7951 Secondary malignant neoplasm of bone: Secondary | ICD-10-CM | POA: Diagnosis not present

## 2020-12-13 DIAGNOSIS — C792 Secondary malignant neoplasm of skin: Secondary | ICD-10-CM | POA: Diagnosis not present

## 2020-12-13 DIAGNOSIS — C3491 Malignant neoplasm of unspecified part of right bronchus or lung: Secondary | ICD-10-CM | POA: Diagnosis not present

## 2020-12-13 DIAGNOSIS — C799 Secondary malignant neoplasm of unspecified site: Secondary | ICD-10-CM | POA: Diagnosis not present

## 2020-12-13 DIAGNOSIS — C7A8 Other malignant neuroendocrine tumors: Secondary | ICD-10-CM | POA: Diagnosis not present

## 2020-12-16 ENCOUNTER — Other Ambulatory Visit (HOSPITAL_COMMUNITY): Payer: Self-pay | Admitting: Hematology

## 2020-12-16 DIAGNOSIS — C7951 Secondary malignant neoplasm of bone: Secondary | ICD-10-CM | POA: Diagnosis not present

## 2020-12-16 DIAGNOSIS — C792 Secondary malignant neoplasm of skin: Secondary | ICD-10-CM | POA: Diagnosis not present

## 2020-12-16 DIAGNOSIS — C3491 Malignant neoplasm of unspecified part of right bronchus or lung: Secondary | ICD-10-CM | POA: Diagnosis not present

## 2020-12-16 DIAGNOSIS — C7A8 Other malignant neuroendocrine tumors: Secondary | ICD-10-CM | POA: Diagnosis not present

## 2020-12-16 DIAGNOSIS — C7B8 Other secondary neuroendocrine tumors: Secondary | ICD-10-CM | POA: Diagnosis not present

## 2020-12-16 DIAGNOSIS — Z510 Encounter for antineoplastic radiation therapy: Secondary | ICD-10-CM | POA: Diagnosis not present

## 2020-12-16 DIAGNOSIS — I2510 Atherosclerotic heart disease of native coronary artery without angina pectoris: Secondary | ICD-10-CM | POA: Diagnosis not present

## 2020-12-16 DIAGNOSIS — K8020 Calculus of gallbladder without cholecystitis without obstruction: Secondary | ICD-10-CM | POA: Diagnosis not present

## 2020-12-16 DIAGNOSIS — C3490 Malignant neoplasm of unspecified part of unspecified bronchus or lung: Secondary | ICD-10-CM | POA: Diagnosis not present

## 2020-12-16 DIAGNOSIS — I898 Other specified noninfective disorders of lymphatic vessels and lymph nodes: Secondary | ICD-10-CM | POA: Diagnosis not present

## 2020-12-16 DIAGNOSIS — K661 Hemoperitoneum: Secondary | ICD-10-CM | POA: Diagnosis not present

## 2020-12-16 DIAGNOSIS — J432 Centrilobular emphysema: Secondary | ICD-10-CM | POA: Diagnosis not present

## 2020-12-16 MED ORDER — PROCHLORPERAZINE MALEATE 10 MG PO TABS: 10.0000 mg | ORAL_TABLET | Freq: Four times a day (QID) | ORAL | 1 refills | Status: DC | PRN

## 2020-12-16 MED ORDER — LIDOCAINE-PRILOCAINE 2.5-2.5 % EX CREA: TOPICAL_CREAM | CUTANEOUS | 3 refills | Status: AC

## 2020-12-16 NOTE — Progress Notes (Signed)
START OFF PATHWAY REGIMEN - Other   [id number]:Carboplatin AUC=5 IV D1 + Paclitaxel 175 mg/m2 IV D1 + Pembrolizumab 200 mg IV D1 q21 Days:   A cycle is every 21 days:     Pembrolizumab      Paclitaxel      Carboplatin   **Always confirm dose/schedule in your pharmacy ordering system**  Patient Characteristics: Intent of Therapy: Non-Curative / Palliative Intent, Discussed with Patient

## 2020-12-16 NOTE — Patient Instructions (Addendum)
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  You will be treated in the clinic every 3 weeks with a combination of chemotherapy and immunotherapy drugs.  Those drugs are paclitaxel (Taxol), carboplatin, and pembrolizumab (Keytruda).  You will receive all three of these drugs every 3 weeks for 4-6 cycles, after which you will receive Keytruda only every 3 weeks as maintenance therapy.  The intent of treatment is to control this disease, prevent it from spreading further, and to alleviate any symptoms you may be having related to this cancer.  You will see the doctor regularly throughout treatment.  We will obtain blood work from you prior to every treatment and monitor your results to make sure it is safe to give your treatment. The doctor monitors your response to treatment by the way you are feeling, your blood work, and by obtaining scans periodically. There will be wait times while you are here for treatment.  It will take about 30 minutes to 1 hour for your lab work to result.  Then there will be wait times while pharmacy mixes your medications.    Medications you will receive in the clinic prior to your chemotherapy medications:   Aloxi:  ALOXI is used in adults to help prevent the nausea and vomiting that happens with certain chemotherapy drugs.  Aloxi is a long acting medication, and will remain in your system for about 2 days.    Dexamethasone:  This is a steroid given prior to chemotherapy to help prevent allergic reactions; it may also help prevent and control nausea and diarrhea.    Pepcid:  This medication is a histamine blocker that helps prevent and allergic reaction to your chemotherapy.    Benadryl:  This is a histamine blocker (different from the Pepcid) that helps prevent allergic/infusion reactions to your chemotherapy. This medication may cause dizziness/drowsiness.     Medication you will receive after treatment:  Neulasta (or similar) - this is a bone marrow stimulant you will receive after chemotherapy to help your body produce neutrophils (a type of white blood cell).  These white blood cells help your body fight infections. You will receive this medication via injection under the skin in your belly at least 24 hours after receiving chemotherapy.     Paclitaxel (Taxol)  About This Drug Paclitaxel is a drug used to treat cancer. It is given in the vein (IV).  This will take 3 hours to infuse.  This first infusion will take longer because the rate is increased slowly every 15 minutes to monitor for reactions.  Your nurse will be in the room with you for the first 15 minutes of the first infusion.  Possible Side Effects   Hair loss. Hair loss is often temporary, although with certain medicine, hair loss can sometimes be permanent. Hair loss may happen suddenly or gradually. If you lose hair, you may lose it from your head, face, armpits, pubic area, chest, and/or legs. You may also notice your hair getting thin.   Swelling of your legs, ankles and/or feet (edema)   Flushing   Nausea and throwing up (vomiting)   Loose bowel movements (diarrhea)   Bone marrow depression. This is a decrease in the number of white blood cells, red blood cells, and platelets. This may raise your risk of infection, make you tired and weak (fatigue), and raise your risk of bleeding.   Effects on the  nerves are called peripheral neuropathy. You may feel numbness, tingling, or pain in your hands and feet. It may be hard for you to button your clothes, open jars, or walk as usual. The effect on the nerves may get worse with more doses of the drug. These effects get better in some people after the drug is stopped but it does not get better in all people.   Changes in your liver function   Bone, joint and muscle pain   Abnormal EKG   Allergic reaction: Allergic reactions,  including anaphylaxis are rare but may happen in some patients. Signs of allergic reaction to this drug may be swelling of the face, feeling like your tongue or throat are swelling, trouble breathing, rash, itching, fever, chills, feeling dizzy, and/or feeling that your heart is beating in a fast or not normal way. If this happens, do not take another dose of this drug. You should get urgent medical treatment.   Infection   Changes in your kidney function.  Note: Each of the side effects above was reported in 20% or greater of patients treated with paclitaxel. Not all possible side effects are included above.  Warnings and Precautions   Severe allergic reactions   Severe bone marrow depression  Treating Side Effects   To help with hair loss, wash with a mild shampoo and avoid washing your hair every day.   Avoid rubbing your scalp, instead, pat your hair or scalp dry   Avoid coloring your hair   Limit your use of hair spray, electric curlers, blow dryers, and curling irons.   If you are interested in getting a wig, talk to your nurse. You can also call the [employer] at 800-ACS-2345 to find out information about the "Look Good, Feel Better" program close to where you live. It is a free program where women getting chemotherapy can learn about wigs, turbans and scarves as well as makeup techniques and skin and nail care.   Ask your doctor or nurse about medicines that are available to help stop or lessen diarrhea and/or nausea.   To help with nausea and vomiting, eat small, frequent meals instead of three large meals a day. Choose foods and drinks that are at room temperature. Ask your nurse or doctor about other helpful tips and medicine that is available to help or stop lessen these symptoms.   If you get diarrhea, eat low-fiber foods that are high in protein and calories and avoid foods that can irritate your digestive tracts or lead to cramping. Ask your nurse or doctor  about medicine that can lessen or stop your diarrhea.   Mouth care is very important. Your mouth care should consist of routine, gentle cleaning of your teeth or dentures and rinsing your mouth with a mixture of 1/2 teaspoon of salt in 8 ounces of water or  teaspoon of baking soda in 8 ounces of water. This should be done at least after each meal and at bedtime.   If you have mouth sores, avoid mouthwash that has alcohol. Also avoid alcohol and smoking because they can bother your mouth and throat.   Drink plenty of fluids (a minimum of eight glasses per day is recommended).   Take your temperature as your doctor or nurse tells you, and whenever you feel like you may have a fever.   Talk to your doctor or nurse about precautions you can take to avoid infections and bleeding.   Be careful when cooking, walking, and  handling sharp objects and hot liquids.  Food and Drug Interactions   There are no known interactions of paclitaxel with food.   This drug may interact with other medicines. Tell your doctor and pharmacist about all the medicines and dietary supplements (vitamins, minerals, herbs and others) that you are taking at this time.   The safety and use of dietary supplements and alternative diets are often not known. Using these might affect your cancer or interfere with your treatment. Until more is known, you should not use dietary supplements or alternative diets without your cancer doctor's help.  When to Call the Doctor  Call your doctor or nurse if you have any of the following symptoms and/or any new or unusual symptoms:   Fever of 100.4 F (38 C) or above   Chills   Redness, pain, warmth, or swelling at the IV site during the infusion   Signs of allergic reaction: swelling of the face, feeling like your tongue or throat are swelling, trouble breathing, rash, itching, fever, chills, feeling dizzy, and/or feeling that your heart is beating in a fast or not normal way    Feeling that your heart is beating in a fast or not normal way (palpitations)   Weight gain of 5 pounds in one week (fluid retention)   Decreased urine or very dark urine   Signs of liver problems: dark urine, pale bowel movements, bad stomach pain, feeling very tired and weak, unusual  itching, or yellowing of the eyes or skin   Heavy menstrual period that lasts longer than normal   Easy bruising or bleeding   Nausea that stops you from eating or drinking, and/or that is not relieved by prescribed medicines.   Loose bowel movements (diarrhea) more than 4 times a day or diarrhea with weakness or lightheadedness   Pain in your mouth or throat that makes it hard to eat or drink   Lasting loss of appetite or rapid weight loss of five pounds in a week   Signs of peripheral neuropathy: numbness, tingling, or decreased feeling in fingers or toes; trouble walking or changes in the way you walk; or feeling clumsy when buttoning clothes, opening jars, or other routine activities   Joint and muscle pain that is not relieved by prescribed medicines   Extreme fatigue that interferes with normal activities   While you are getting this drug, please tell your nurse right away if you have any pain, redness, or swelling at the site of the IV infusion.   If you think you are pregnant.  Reproduction Warnings   Pregnancy warning: This drug may have harmful effects on the unborn child, it is recommended that effective methods of birth control should be used during your cancer treatment. Let your doctor know right away if you think you may be pregnant.   Breast feeding warning: Women should not breast feed during treatment because this drug could enter the breastmilk and cause harm to a breast feeding baby.   Carboplatin (Paraplatin, CBDCA)  About This Drug  Carboplatin is used to treat cancer. It is given in the vein (IV).  It will take 30 minutes to infuse.   Possible Side Effects   Bone  marrow suppression. This is a decrease in the number of white blood cells, red blood cells, and platelets. This may raise your risk of infection, make you tired and weak (fatigue), and raise your risk of bleeding.   Nausea and vomiting (throwing up)   Weakness  Changes in your liver function   Changes in your kidney function   Electrolyte changes   Pain  Note: Each of the side effects above was reported in 20% or greater of patients treated with carboplatin. Not all possible side effects are included above.   Warnings and Precautions   Severe bone marrow suppression   Allergic reactions, including anaphylaxis are rare but may happen in some patients. Signs of allergic reaction to this drug may be swelling of the face, feeling like your tongue or throat are swelling, trouble breathing, rash, itching, fever, chills, feeling dizzy, and/or feeling that your heart is beating in a fast or not normal way. If this happens, do not take another dose of this drug. You should get urgent medical treatment.   Severe nausea and vomiting   Effects on the nerves are called peripheral neuropathy. This risk is increased if you are over the age of 33 or if you have received other medicine with risk of peripheral neuropathy. You may feel numbness, tingling, or pain in your hands and feet. It may be hard for you to button your clothes, open jars, or walk as usual. The effect on the nerves may get worse with more doses of the drug. These effects get better in some people after the drug is stopped but it does not get better in all people.   Blurred vision, loss of vision or other changes in eyesight   Decreased hearing   - Skin and tissue irritation including redness, pain, warmth, or swelling at the IV site if the drug leaks out of the vein and into nearby tissue.   Severe changes in your kidney function, which can cause kidney failure   Severe changes in your liver function, which can cause liver  failure  Note: Some of the side effects above are very rare. If you have concerns and/or questions, please discuss them with your medical team.   Important Information   This drug may be present in the saliva, tears, sweat, urine, stool, vomit, semen, and vaginal secretions. Talk to your doctor and/or your nurse about the necessary precautions to take during this time.   Treating Side Effects   Manage tiredness by pacing your activities for the day.   Be sure to include periods of rest between energy-draining activities.   To decrease the risk of infection, wash your hands regularly.   Avoid close contact with people who have a cold, the flu, or other infections.   Take your temperature as your doctor or nurse tells you, and whenever you feel like you may have a fever.   To help decrease the risk of bleeding, use a soft toothbrush. Check with your nurse before using dental floss.   Be very careful when using knives or tools.   Use an electric shaver instead of a razor.   Drink plenty of fluids (a minimum of eight glasses per day is recommended).   If you throw up or have loose bowel movements, you should drink more fluids so that you do not become dehydrated (lack of water in the body from losing too much fluid).   To help with nausea and vomiting, eat small, frequent meals instead of three large meals a day. Choose foods and drinks that are at room temperature. Ask your nurse or doctor about other helpful tips and medicine that is available to help stop or lessen these symptoms.   If you have numbness and tingling in your hands and feet, be  careful when cooking, walking, and handling sharp objects and hot liquids.   Keeping your pain under control is important to your well-being. Please tell your doctor or nurse if you are experiencing pain.   Food and Drug Interactions   There are no known interactions of carboplatin with food.   This drug may interact with other  medicines. Tell your doctor and pharmacist about all the prescription and over-the-counter medicines and dietary supplements (vitamins, minerals, herbs and others) that you are taking at this time. Also, check with your doctor or pharmacist before starting any new prescription or over-the-counter medicines, or dietary supplements to make sure that there are no interactions.   When to Call the Doctor  Call your doctor or nurse if you have any of these symptoms and/or any new or unusual symptoms:   Fever of 100.4 F (38 C) or higher   Chills   Tiredness that interferes with your daily activities   Feeling dizzy or lightheaded   Easy bleeding or bruising   Nausea that stops you from eating or drinking and/or is not relieved by prescribed medicines   Throwing up   Blurred vision or other changes in eyesight   Decrease in hearing or ringing in the ear   Signs of allergic reaction: swelling of the face, feeling like your tongue or throat are swelling, trouble breathing, rash, itching, fever, chills, feeling dizzy, and/or feeling that your heart is beating in a fast or not normal way. If this happens, call 911 for emergency care.   Signs of possible liver problems: dark urine, pale bowel movements, bad stomach pain, feeling very tired and weak, unusual itching, or yellowing of the eyes or skin   Decreased urine, or very dark urine   Numbness, tingling, or pain in your hands and feet   Pain that does not go away or is not relieved by prescribed medicine   While you are getting this drug, please tell your nurse right away if you have any pain, redness, or swelling at the site of the IV infusion, or if you have any new onset of symptoms, or if you just feel "different" from before when the infusion was started.   Reproduction Warnings   Pregnancy warning: This drug may have harmful effects on the unborn baby. Women of child bearing potential should use effective methods of birth  control during your cancer treatment. Let your doctor know right away if you think you may be pregnant.   Breastfeeding warning: It is not known if this drug passes into breast milk. For this reason, women should not breastfeed during treatment because this drug could enter the breast milk and cause harm to a breastfeeding baby.   Fertility warning: Human fertility studies have not been done with this drug. Talk with your doctor or nurse if you plan to have children. Ask for information on sperm or egg banking.   Pembrolizumab Beryle Flock)  About This Drug Pembrolizumab is used to treat cancer. It is given in the vein (IV).  This drug will take 30 minutes to infuse.  Possible Side Effects  Tiredness  Fever  Nausea  Decreased appetite (decreased hunger)  Loose bowel movements (diarrhea)  Constipation (not able to move bowels)  Trouble breathing  Rash  Itching  Muscle and bone pain  Cough  Note: Each of the side effects above was reported in 20% or greater of patients treated with pembrolizumab. Not all possible side effects are included above.  Warnings and Precautions  This drug works with your immune system and can cause inflammation in any of your organs and tissues and can change how they work. This may put you at risk for developing serious medical problems which can very rarely be fatal.   Colitis (swelling or inflammation in the colon) - symptoms are loose bowel movements (diarrhea) stomach cramping, and sometimes blood in the stool   Changes in liver function. Your liver function will be checked as needed.   Changes in kidney function, which can very rarely be fatal. Your kidney function will be checked as needed.   Inflammation (swelling) of the lungs which can very rarely be fatal - you may have a dry cough or trouble breathing.   This drug may affect some of your hormone glands (especially the thyroid, adrenals, pituitary and pancreas). Your hormone levels will be  checked as needed.   Blood sugar levels may change and you may develop diabetes. If you already have diabetes, changes may need to be made to your diabetes medication.   Severe allergic skin reaction, which can very rarely be fatal. You may develop blisters on your skin that are filled with fluid or a severe red rash all over your body that may be painful.   Increased risk of organ rejection in patients who have received donor organs   Increased risk of complications in patients who will undergo a stem cell transplant after receiving pembrolizumab.   While you are getting this drug in your vein (IV), you may have a reaction to the drug. Your nurse will check you closely for these signs: fever or shaking chills, flushing, facial swelling, feeling dizzy, headache, trouble breathing, rash, itching, chest tightness, or chest pain. These reactions may occur after your infusion. If this happens, call 911 for emergency care.  Important Information This drug may be present in the saliva, tears, sweat, urine, stool, vomit, semen, and vaginal secretions. Talk to your doctor and/or your nurse about the necessary precautions to take during this time.  Treating Side Effects   Ask your doctor or nurse about medicines that are available to help stop or lessen constipation, diarrhea and/or nausea.   Drink plenty of fluids (a minimum of eight glasses per day is recommended).   If you are not able to move your bowels, check with your doctor or nurse before you use any enemas, laxatives, or suppositories   To help with nausea and vomiting, eat small, frequent meals instead of three large meals a day. Choose foods and drinks that are at room temperature. Ask your nurse or doctor about other helpful tips and medicine that is available to help or stop lessen these symptoms.   If you get diarrhea, eat low-fiber foods that are high in protein and calories and avoid foods that can irritate your digestive tracts or  lead to cramping. Ask your nurse or doctor about medicine that can lessen or stop your diarrhea.   Manage tiredness by pacing your activities for the day. Be sure to include periods of rest between energy-draining activities   Keeping your pain under control is important to your wellbeing. Please tell your doctor or nurse if you are experiencing pain.   If you have diabetes, keep good control of your blood sugar level. Tell your nurse or your doctor if your glucose levels are higher or lower than normal   If you get a rash do not put anything on it unless your doctor or nurse says you may. Keep the area  around the rash clean and dry. Ask your doctor for medicine if your rash bothers you.   Infusion reactions may happen for 24 hours after your infusion. If this happens, call 911 for emergency care.  Food and Drug Interactions   There are no known interactions of pembrolizumab with food.   There are no known interactions of pembrolizumab with other medications.   Tell your doctor and pharmacist about all the medicines and dietary supplements (vitamins, minerals, herbs and others) that you are taking at this time. The safety and use of dietary supplements and alternative agents are often not known. Using these might affect your cancer or interfere with your treatment. Until more is known, you should not use dietary supplements or alternative agents without your cancer doctor's help.   When to Call the Doctor Call your doctor or nurse if you have any of the following symptoms and/or any new or unusual symptoms:   Fever of 100.4 F (38 C) or higher   Chills   Wheezing or trouble breathing   Rash or itching   Feeling dizzy or lightheaded   Loose bowel movements (diarrhea) more than 4 times a day or diarrhea with weakness or lightheadedness, or diarrhea that is not controlled by medications   Nausea that stops you from eating or drinking, and/or that is not relieved by prescribed  medicines   Lasting loss of appetite or rapid weight loss of five pounds in a week   Fatigue that interferes with your daily activities   No bowel movement for 3 days or you feel uncomfortable   Extreme weakness that interferes with normal activities   Bad abdominal pain, especially in upper right area   Decreased urine   Unusual thirst or passing urine often   Rash that is not relieved by prescribed medicines   Flu-like symptoms: fever, headache, muscle and joint aches, and fatigue (low energy, feeling weak)   Signs of liver problems: dark urine, pale bowel movements, bad stomach pain, feeling very tired and weak, unusual itching, or yellowing of the eyes or skin   Signs of infusion reactions such as fever or shaking chills, flushing, facial swelling, feeling dizzy, headache, trouble breathing, rash, itching, chest tightness, or chest pain.   Reproduction Warnings   Pregnancy warning: This drug may have harmful effects on the unborn baby. Women of childbearing potential should use effective methods of birth control during your cancer treatment and for at least 4 months after treatment. Let your doctor know right away if you think you may be pregnant   Breast feeding warning: It is not known if this drug passes into breast milk. It is recommended that women do not breastfeed during treatment and for 4 months after treatment.   Fertility warning: Human fertility studies have not been done with this drug. Talk with your doctor or nurse if you plan to have children.     SELF CARE ACTIVITIES WHILE RECEIVING CHEMOTHERAPY/IMMUNOTHERAPY:  Hydration Increase your fluid intake 48 hours prior to treatment and drink at least 8 to 12 cups (64 ounces) of water/decaffeinated beverages per day after treatment. You can still have your cup of coffee or soda but these beverages do not count as part of your 8 to 12 cups that you need to drink daily. No alcohol intake.  Medications Continue  taking your normal prescription medication as prescribed.  If you start any new herbal or new supplements please let us know first to make sure it is safe.  Mouth  Care Have teeth cleaned professionally before starting treatment. Keep dentures and partial plates clean. Use soft toothbrush and do not use mouthwashes that contain alcohol. Biotene is a good mouthwash that is available at most pharmacies or may be ordered by calling [phone]. Use warm salt water gargles (1 teaspoon salt per 1 quart warm water) before and after meals and at bedtime. If you need dental work, please let the doctor know before you go for your appointment so that we can coordinate the best possible time for you in regards to your chemo regimen. You need to also let your dentist know that you are actively taking chemo. We may need to do labs prior to your dental appointment.  Skin Care Always use sunscreen that has not expired and with SPF (Sun Protection Factor) of 50 or higher. Wear hats to protect your head from the sun. Remember to use sunscreen on your hands, ears, face, & feet.  Use good moisturizing lotions such as udder cream, eucerin, or even Vaseline. Some chemotherapies can cause dry skin, color changes in your skin and nails.    Avoid long, hot showers or baths. Use gentle, fragrance-free soaps and laundry detergent. Use moisturizers, preferably creams or ointments rather than lotions because the thicker consistency is better at preventing skin dehydration. Apply the cream or ointment within 15 minutes of showering. Reapply moisturizer at night, and moisturize your hands every time after you wash them.  Hair Loss (if your doctor says your hair will fall out)  If your doctor says that your hair is likely to fall out, decide before you begin chemo whether you want to wear a wig. You may want to shop before treatment to match your hair color. Hats, turbans, and scarves can also camouflage hair loss, although some  people prefer to leave their heads uncovered. If you go bare-headed outdoors, be sure to use sunscreen on your scalp. Cut your hair short. It eases the inconvenience of shedding lots of hair, but it also can reduce the emotional impact of watching your hair fall out. Don't perm or color your hair during chemotherapy. Those chemical treatments are already damaging to hair and can enhance hair loss. Once your chemo treatments are done and your hair has grown back, it's OK to resume dyeing or perming hair.  With chemotherapy, hair loss is almost always temporary. But when it grows back, it may be a different color or texture. In older adults who still had hair color before chemotherapy, the new growth may be completely gray.  Often, new hair is very fine and soft.  Infection Prevention Please wash your hands for at least 30 seconds using warm soapy water. Handwashing is the #1 way to prevent the spread of germs. Stay away from sick people or people who are getting over a cold. If you develop respiratory systems such as green/yellow mucus production or productive cough or persistent cough let us know and we will see if you need an antibiotic. It is a good idea to keep a pair of gloves on when going into grocery stores/[employer] to decrease your risk of coming into contact with germs on the carts, etc. Carry alcohol hand gel with you at all times and use it frequently if out in public. If your temperature reaches 100.4 or higher please call the clinic and let us know.  If it is after hours or on the weekend please go to the ER if your temperature is over 100.4.  Please have your  own personal thermometer at home to use.    Sex and bodily fluids If you are going to have sex, a condom must be used to protect the person that isn't taking chemotherapy. Chemo can decrease your libido (sex drive). For a few days after chemotherapy, chemotherapy can be excreted through your bodily fluids.  When using the toilet please  close the lid and flush the toilet twice.  Do this for a few day after you have had chemotherapy.   Effects of chemotherapy on your sex life Some changes are simple and won't last long. They won't affect your sex life permanently.  Sometimes you may feel: too tired not strong enough to be very active sick or sore  not in the mood anxious or low  Your anxiety might not seem related to sex. For example, you may be worried about the cancer and how your treatment is going. Or you may be worried about money, or about how you family are coping with your illness.  These things can cause stress, which can affect your interest in sex. It's important to talk to your partner about how you feel.  Remember - the changes to your sex life don't usually last long. There's usually no medical reason to stop having sex during chemo. The drugs won't have any long term physical effects on your performance or enjoyment of sex. Cancer can't be passed on to your partner during sex  Contraception It's important to use reliable contraception during treatment. Avoid getting pregnant while you or your partner are having chemotherapy. This is because the drugs may harm the baby. Sometimes chemotherapy drugs can leave a man or woman infertile.  This means you would not be able to have children in the future. You might want to talk to someone about permanent infertility. It can be very difficult to learn that you may no longer be able to have children. Some people find counselling helpful. There might be ways to preserve your fertility, although this is easier for men than for women. You may want to speak to a fertility expert. You can talk about sperm banking or harvesting your eggs. You can also ask about other fertility options, such as donor eggs. If you have or have had breast cancer, your doctor might advise you not to take the contraceptive pill. This is because the hormones in it might affect the cancer. It is not known for  sure whether or not chemotherapy drugs can be passed on through semen or secretions from the vagina. Because of this some doctors advise people to use a barrier method if you have sex during treatment. This applies to vaginal, anal or oral sex. Generally, doctors advise a barrier method only for the time you are actually having the treatment and for about a week after your treatment. Advice like this can be worrying, but this does not mean that you have to avoid being intimate with your partner. You can still have close contact with your partner and continue to enjoy sex.  Animals If you have cats or birds we just ask that you not change the litter or change the cage.  Please have someone else do this for you while you are on chemotherapy.   Food Safety During and After Cancer Treatment Food safety is important for people both during and after cancer treatment. Cancer and cancer treatments, such as chemotherapy, radiation therapy, and stem cell/bone marrow transplantation, often weaken the immune system. This makes it harder for your body  to protect itself from foodborne illness, also called food poisoning. Foodborne illness is caused by eating food that contains harmful bacteria, parasites, or viruses.  Foods to avoid Some foods have a higher risk of becoming tainted with bacteria. These include: Unwashed fresh fruit and vegetables, especially leafy vegetables that can hide dirt and other contaminants Raw sprouts, such as alfalfa sprouts Raw or undercooked beef, especially ground beef, or other raw or undercooked meat and poultry Fatty, fried, or spicy foods immediately before or after treatment.  These can sit heavy on your stomach and make you feel nauseous. Raw or undercooked shellfish, such as oysters. Sushi and sashimi, which often contain raw fish.  Unpasteurized beverages, such as unpasteurized fruit juices, raw milk, raw yogurt, or cider Undercooked eggs, such as soft boiled, over easy, and  poached; raw, unpasteurized eggs; or foods made with raw egg, such as homemade raw cookie dough and homemade mayonnaise  Simple steps for food safety  Shop smart. Do not buy food stored or displayed in an unclean area. Do not buy bruised or damaged fruits or vegetables. Do not buy cans that have cracks, dents, or bulges. Pick up foods that can spoil at the end of your shopping trip and store them in a cooler on the way home.  Prepare and clean up foods carefully. Rinse all fresh fruits and vegetables under running water, and dry them with a clean towel or paper towel. Clean the top of cans before opening them. After preparing food, wash your hands for 20 seconds with hot water and soap. Pay special attention to areas between fingers and under nails. Clean your utensils and dishes with hot water and soap. Disinfect your kitchen and cutting boards using 1 teaspoon of liquid, unscented bleach mixed into 1 quart of water.    Dispose of old food. Eat canned and packaged food before its expiration date (the "use by" or "best before" date). Consume refrigerated leftovers within 3 to 4 days. After that time, throw out the food. Even if the food does not smell or look spoiled, it still may be unsafe. Some bacteria, such as Listeria, can grow even on foods stored in the refrigerator if they are kept for too long.  Take precautions when eating out. At restaurants, avoid buffets and salad bars where food sits out for a long time and comes in contact with many people. Food can become contaminated when someone with a virus, often a norovirus, or another "bug" handles it. Put any leftover food in a "to-go" container yourself, rather than having the server do it. And, refrigerate leftovers as soon as you get home. Choose restaurants that are clean and that are willing to prepare your food as you order it cooked.   AT HOME MEDICATIONS:  Compazine/Prochlorperazine 10mg  tablet. Take 1 tablet every 6 hours as needed for nausea/vomiting. (This can make you sleepy)   EMLA cream. Apply a quarter size amount to port site 1 hour prior to chemo. Do not rub in. Cover with plastic wrap.    Diarrhea Sheet   If you are having loose stools/diarrhea, please purchase Imodium and begin taking as outlined:  At the first sign of poorly formed or loose stools you should begin taking Imodium (loperamide) 2 mg capsules.  Take two tablets (4mg ) followed by one tablet (2mg ) every 2 hours - DO NOT EXCEED 8 tablets in 24 hours.  If it is bedtime and you are having loose stools, take 2 tablets at bedtime, then 2 tablets every 4 hours until morning.   Always call the [facility] if you are having loose stools/diarrhea that you can't get under control.  Loose stools/diarrhea leads to dehydration (loss of water) in your body.  We have other options of trying to get the loose stools/diarrhea to stop but you must let us know!   Constipation Sheet  Colace - 100 mg capsules - take 2 capsules daily.  If this doesn't help then you can increase to 2 capsules twice daily.  Please call if the above does not work for you. Do not go more than 2 days without a bowel movement.  It is very important that you do not become constipated.  It will make you feel sick to your stomach (nausea) and can cause abdominal pain and vomiting.  Nausea Sheet   Compazine/Prochlorperazine 10mg  tablet. Take 1 tablet every 6 hours as needed for nausea/vomiting (This can make you drowsy).  If you are having persistent nausea (nausea that does not stop) please call the [facility] and let us know the amount of nausea that you are experiencing.  If you begin to vomit, you need to call the [facility] and if it is the weekend and you have vomited more than one time and can't get it to stop-go to the  Emergency Room.  Persistent nausea/vomiting can lead to dehydration (loss of fluid in your body) and will make you feel very weak and unwell. Ice chips, sips of clear liquids, foods that are at room temperature, crackers, and toast tend to be better tolerated.   SYMPTOMS TO REPORT AS SOON AS POSSIBLE AFTER TREATMENT:  FEVER GREATER THAN 100.4 F  CHILLS WITH OR WITHOUT FEVER  NAUSEA AND VOMITING THAT IS NOT CONTROLLED WITH YOUR NAUSEA MEDICATION  UNUSUAL SHORTNESS OF BREATH  UNUSUAL BRUISING OR BLEEDING  TENDERNESS IN MOUTH AND THROAT WITH OR WITHOUT PRESENCE OF ULCERS  URINARY PROBLEMS  BOWEL PROBLEMS  UNUSUAL RASH      Wear comfortable clothing and clothing appropriate for easy access to any Portacath or PICC line. Let us know if there is anything that we can do to make your therapy better!    What to do if you need assistance after hours or on the weekends: CALL [phone].  HOLD on the line, do not hang up.  You will hear multiple messages but at the end you will be connected with a nurse triage line.  They will contact the doctor if necessary.  Most of the time they will be able to assist you.  Do not call the hospital operator.      I have been informed and understand all of the instructions given to me and have received a copy. I have been instructed to call the clinic [phone] or my family physician as soon as possible for continued medical care, if indicated. I do not have any more questions at this time but understand that I may call the [facility] or the Patient Navigator at [phone] during office hours should I have questions or need assistance in obtaining follow-up care.

## 2020-12-17 ENCOUNTER — Other Ambulatory Visit: Payer: Self-pay

## 2020-12-17 ENCOUNTER — Other Ambulatory Visit (HOSPITAL_COMMUNITY): Payer: Self-pay | Admitting: Hematology

## 2020-12-17 ENCOUNTER — Encounter: Payer: Self-pay | Admitting: General Practice

## 2020-12-17 ENCOUNTER — Inpatient Hospital Stay (HOSPITAL_COMMUNITY): Payer: Medicare Other

## 2020-12-17 ENCOUNTER — Encounter (HOSPITAL_COMMUNITY): Payer: Self-pay | Admitting: General Practice

## 2020-12-17 ENCOUNTER — Telehealth: Payer: Self-pay

## 2020-12-17 ENCOUNTER — Encounter (HOSPITAL_COMMUNITY): Payer: Self-pay

## 2020-12-17 VITALS — BP 149/68 | HR 63 | Temp 97.8°F | Resp 18

## 2020-12-17 DIAGNOSIS — Z5111 Encounter for antineoplastic chemotherapy: Secondary | ICD-10-CM | POA: Diagnosis not present

## 2020-12-17 DIAGNOSIS — Z87891 Personal history of nicotine dependence: Secondary | ICD-10-CM | POA: Diagnosis not present

## 2020-12-17 DIAGNOSIS — C3411 Malignant neoplasm of upper lobe, right bronchus or lung: Secondary | ICD-10-CM

## 2020-12-17 DIAGNOSIS — I959 Hypotension, unspecified: Secondary | ICD-10-CM

## 2020-12-17 DIAGNOSIS — Z79899 Other long term (current) drug therapy: Secondary | ICD-10-CM | POA: Diagnosis not present

## 2020-12-17 DIAGNOSIS — C7A00 Malignant carcinoid tumor of unspecified site: Secondary | ICD-10-CM | POA: Diagnosis not present

## 2020-12-17 DIAGNOSIS — C7A090 Malignant carcinoid tumor of the bronchus and lung: Secondary | ICD-10-CM | POA: Diagnosis not present

## 2020-12-17 LAB — CBC WITH DIFFERENTIAL/PLATELET
Abs Immature Granulocytes: 0.08 10*3/uL — ABNORMAL HIGH (ref 0.00–0.07)
Basophils Absolute: 0.1 10*3/uL (ref 0.0–0.1)
Basophils Relative: 1 %
Eosinophils Absolute: 0.4 10*3/uL (ref 0.0–0.5)
Eosinophils Relative: 3 %
HCT: 46.2 % — ABNORMAL HIGH (ref 36.0–46.0)
Hemoglobin: 14.4 g/dL (ref 12.0–15.0)
Immature Granulocytes: 1 %
Lymphocytes Relative: 4 %
Lymphs Abs: 0.6 10*3/uL — ABNORMAL LOW (ref 0.7–4.0)
MCH: 29.1 pg (ref 26.0–34.0)
MCHC: 31.2 g/dL (ref 30.0–36.0)
MCV: 93.3 fL (ref 80.0–100.0)
Monocytes Absolute: 1 10*3/uL (ref 0.1–1.0)
Monocytes Relative: 7 %
Neutro Abs: 11.7 10*3/uL — ABNORMAL HIGH (ref 1.7–7.7)
Neutrophils Relative %: 84 %
Platelets: 210 10*3/uL (ref 150–400)
RBC: 4.95 MIL/uL (ref 3.87–5.11)
RDW: 15.8 % — ABNORMAL HIGH (ref 11.5–15.5)
WBC: 13.8 10*3/uL — ABNORMAL HIGH (ref 4.0–10.5)
nRBC: 0 % (ref 0.0–0.2)

## 2020-12-17 LAB — COMPREHENSIVE METABOLIC PANEL
ALT: 19 U/L (ref 0–44)
AST: 42 U/L — ABNORMAL HIGH (ref 15–41)
Albumin: 3.2 g/dL — ABNORMAL LOW (ref 3.5–5.0)
Alkaline Phosphatase: 80 U/L (ref 38–126)
Anion gap: 10 (ref 5–15)
BUN: 54 mg/dL — ABNORMAL HIGH (ref 8–23)
CO2: 28 mmol/L (ref 22–32)
Calcium: 8.6 mg/dL — ABNORMAL LOW (ref 8.9–10.3)
Chloride: 96 mmol/L — ABNORMAL LOW (ref 98–111)
Creatinine, Ser: 1.95 mg/dL — ABNORMAL HIGH (ref 0.44–1.00)
GFR, Estimated: 26 mL/min — ABNORMAL LOW (ref >60)
Glucose, Bld: 128 mg/dL — ABNORMAL HIGH (ref 70–99)
Potassium: 4.5 mmol/L (ref 3.5–5.1)
Sodium: 134 mmol/L — ABNORMAL LOW (ref 135–145)
Total Bilirubin: 0.8 mg/dL (ref 0.3–1.2)
Total Protein: 7.2 g/dL (ref 6.5–8.1)

## 2020-12-17 LAB — MAGNESIUM: Magnesium: 2.1 mg/dL (ref 1.7–2.4)

## 2020-12-17 LAB — TSH: TSH: 6.764 u[IU]/mL — ABNORMAL HIGH (ref 0.350–4.500)

## 2020-12-17 MED ORDER — SODIUM CHLORIDE 0.9% FLUSH: 10.0000 mL | INTRAVENOUS | Status: DC | PRN

## 2020-12-17 MED ORDER — MORPHINE SULFATE (PF) 2 MG/ML IV SOLN
2.0000 mg | Freq: Once | INTRAVENOUS | Status: DC
Start: 2020-12-17 — End: 2020-12-17

## 2020-12-17 MED ORDER — SODIUM CHLORIDE 0.9 % IV SOLN
256.0000 mg | Freq: Once | INTRAVENOUS | Status: AC
  Administered 2020-12-17: 260 mg via INTRAVENOUS
  Filled 2020-12-17: qty 26

## 2020-12-17 MED ORDER — SODIUM CHLORIDE 0.9 % IV SOLN
10.0000 mg | Freq: Once | INTRAVENOUS | Status: AC
  Administered 2020-12-17: 10 mg via INTRAVENOUS
  Filled 2020-12-17: qty 10

## 2020-12-17 MED ORDER — FAMOTIDINE 20 MG IN NS 100 ML IVPB
20.0000 mg | Freq: Once | INTRAVENOUS | Status: AC
  Administered 2020-12-17: 20 mg via INTRAVENOUS
  Filled 2020-12-17: qty 20

## 2020-12-17 MED ORDER — PALONOSETRON HCL INJECTION 0.25 MG/5ML
0.2500 mg | Freq: Once | INTRAVENOUS | Status: AC
Start: 2020-12-17 — End: 2020-12-17
  Administered 2020-12-17: 0.25 mg via INTRAVENOUS
  Filled 2020-12-17: qty 5

## 2020-12-17 MED ORDER — SODIUM CHLORIDE 0.9 % IV SOLN: Freq: Once | INTRAVENOUS | Status: AC

## 2020-12-17 MED ORDER — DIPHENHYDRAMINE HCL 50 MG/ML IJ SOLN
50.0000 mg | Freq: Once | INTRAMUSCULAR | Status: AC
  Administered 2020-12-17: 50 mg via INTRAVENOUS
  Filled 2020-12-17: qty 1

## 2020-12-17 MED ORDER — SODIUM CHLORIDE 0.9 % IV SOLN
200.0000 mg | Freq: Once | INTRAVENOUS | Status: AC
  Administered 2020-12-17: 200 mg via INTRAVENOUS
  Filled 2020-12-17: qty 8

## 2020-12-17 MED ORDER — SODIUM CHLORIDE 0.9 % IV SOLN: INTRAVENOUS | Status: DC

## 2020-12-17 MED ORDER — SODIUM CHLORIDE 0.9 % IV SOLN
140.0000 mg/m2 | Freq: Once | INTRAVENOUS | Status: AC
  Administered 2020-12-17: 258 mg via INTRAVENOUS
  Filled 2020-12-17: qty 43

## 2020-12-17 MED ORDER — SODIUM CHLORIDE 0.9 % IV SOLN
150.0000 mg | Freq: Once | INTRAVENOUS | Status: AC
  Administered 2020-12-17: 150 mg via INTRAVENOUS
  Filled 2020-12-17: qty 150

## 2020-12-17 NOTE — Progress Notes (Signed)

## 2020-12-17 NOTE — Progress Notes (Signed)
Patient presents today for D1C1 of Pembrolizumab, Paclitaxel, and Carboplatin. Chemo teaching completed and consent signed. Patient's pain 10/10 in shoulder. Patient's BP 86/46/, 98/48, patient states she took blood pressure medication last night and she took pain medication this morning. Dr. Delton Coombes notified. Per Dr. Delton Coombes hold IV morphine d/t low BP, give 1 liter of normal saline during today's treatment. RN repositioned patient in bed to help with pain. Ser. Creatine 1.95, okay to proceed with treatment per Dr. Delton Coombes, plan signed. Patient tolerated chemotherapy and 1L of NS with no complaints voiced. Side effects with management reviewed understanding verbalized. IV site clean and dry with no bruising or swelling noted at site. Good blood return noted before and after administration of chemotherapy. Band aid applied. Patient left in satisfactory condition with VSS and no s/s of distress noted.

## 2020-12-17 NOTE — Progress Notes (Signed)
[facility] CSW Progress Notes  Met w patient in bed during her first infusion. She reports she is doing well overall, good support at home.  No needs identified.  Provided information on Support Center, [facility] of  - all of which provide support to patient diagnosed with lung cancer.  She has my card for the future if needs arise.  Anne Cunningham, LCSW Clinical Social Worker Phone:  [phone]  

## 2020-12-17 NOTE — Progress Notes (Signed)
Ok to proceed with BP - patient to receive 1 liter NS. OK to proceed with today's lab values. (Elevated creatinine).  T.O. Dr Rhys Martini, PharmD

## 2020-12-17 NOTE — Telephone Encounter (Signed)
Lmom Warfarin inr due

## 2020-12-17 NOTE — Patient Instructions (Signed)
[facility]  Discharge Instructions: Thank you for choosing [facility] to provide your oncology and hematology care.  If you have a lab appointment with the [facility], please come in thru the Main Entrance and check in at the main information desk.  Wear comfortable clothing and clothing appropriate for easy access to any Portacath or PICC line.   We strive to give you quality time with your provider. You may need to reschedule your appointment if you arrive late (15 or more minutes).  Arriving late affects you and other patients whose appointments are after yours.  Also, if you miss three or more appointments without notifying the office, you may be dismissed from the clinic at the provider's discretion.      For prescription refill requests, have your pharmacy contact our office and allow 72 hours for refills to be completed.    Today you received the following chemotherapy and/or immunotherapy agents Carbo/Taxol. Return as scheduled.   To help prevent nausea and vomiting after your treatment, we encourage you to take your nausea medication as directed.  BELOW ARE SYMPTOMS THAT SHOULD BE REPORTED IMMEDIATELY: *FEVER GREATER THAN 100.4 F (38 C) OR HIGHER *CHILLS OR SWEATING *NAUSEA AND VOMITING THAT IS NOT CONTROLLED WITH YOUR NAUSEA MEDICATION *UNUSUAL SHORTNESS OF BREATH *UNUSUAL BRUISING OR BLEEDING *URINARY PROBLEMS (pain or burning when urinating, or frequent urination) *BOWEL PROBLEMS (unusual diarrhea, constipation, pain near the anus) TENDERNESS IN MOUTH AND THROAT WITH OR WITHOUT PRESENCE OF ULCERS (sore throat, sores in mouth, or a toothache) UNUSUAL RASH, SWELLING OR PAIN  UNUSUAL VAGINAL DISCHARGE OR ITCHING   Items with * indicate a potential emergency and should be followed up as soon as possible or go to the Emergency Department if any problems should occur.  Please show the CHEMOTHERAPY ALERT CARD or IMMUNOTHERAPY ALERT CARD at check-in to  the Emergency Department and triage nurse.  Should you have questions after your visit or need to cancel or reschedule your appointment, please contact [facility] [phone]  and follow the prompts.  Office hours are 8:00 a.m. to 4:30 p.m. Monday - Friday. Please note that voicemails left after 4:00 p.m. may not be returned until the following business day.  We are closed weekends and major holidays. You have access to a nurse at all times for urgent questions. Please call the main number to the clinic [phone] and follow the prompts.  For any non-urgent questions, you may also contact your provider using MyChart. We now offer e-Visits for anyone 71 and older to request care online for non-urgent symptoms. For details visit [email or web address].   Also download the MyChart app! Go to the app store, search "MyChart", open the app, select , and log in with your MyChart username and password.  Due to Covid, a mask is required upon entering the hospital/clinic. If you do not have a mask, one will be given to you upon arrival. For doctor visits, patients may have 1 support person aged 49 or older with them. For treatment visits, patients cannot have anyone with them due to current Covid guidelines and our immunocompromised population.

## 2020-12-18 ENCOUNTER — Other Ambulatory Visit: Payer: Self-pay | Admitting: Thoracic Surgery (Cardiothoracic Vascular Surgery)

## 2020-12-18 ENCOUNTER — Ambulatory Visit (INDEPENDENT_AMBULATORY_CARE_PROVIDER_SITE_OTHER): Payer: Medicare Other | Admitting: Cardiology

## 2020-12-18 ENCOUNTER — Telehealth (HOSPITAL_COMMUNITY): Payer: Self-pay

## 2020-12-18 DIAGNOSIS — Z7901 Long term (current) use of anticoagulants: Secondary | ICD-10-CM

## 2020-12-18 LAB — POCT INR: INR: 2.8 (ref 2.0–3.0)

## 2020-12-18 LAB — T4: T4, Total: 16.9 ug/dL — ABNORMAL HIGH (ref 4.5–12.0)

## 2020-12-18 NOTE — Telephone Encounter (Signed)
Called patient for 24 hour follow-up. Patient states she is doing well however has some nervousness and fatigue. Patient states she had diarrhea this morning but has not another episode of diarrhea since. Patient states she does not have imodium at home but still need to pick up prescriptions from the drug store. Patient informed to call the [facility] if any new symptoms arise.

## 2020-12-19 ENCOUNTER — Telehealth: Payer: Self-pay | Admitting: Dietician

## 2020-12-19 ENCOUNTER — Inpatient Hospital Stay (HOSPITAL_COMMUNITY): Payer: Medicare Other

## 2020-12-19 ENCOUNTER — Telehealth (HOSPITAL_COMMUNITY): Payer: Self-pay | Admitting: Medical

## 2020-12-19 ENCOUNTER — Encounter (HOSPITAL_COMMUNITY): Payer: Medicare Other | Admitting: Dietician

## 2020-12-19 ENCOUNTER — Other Ambulatory Visit: Payer: Self-pay

## 2020-12-19 ENCOUNTER — Telehealth: Payer: Self-pay

## 2020-12-19 VITALS — BP 132/61 | HR 61 | Temp 96.7°F | Resp 18

## 2020-12-19 DIAGNOSIS — C3491 Malignant neoplasm of unspecified part of right bronchus or lung: Secondary | ICD-10-CM | POA: Diagnosis not present

## 2020-12-19 DIAGNOSIS — Z510 Encounter for antineoplastic radiation therapy: Secondary | ICD-10-CM | POA: Diagnosis not present

## 2020-12-19 DIAGNOSIS — Z87891 Personal history of nicotine dependence: Secondary | ICD-10-CM | POA: Diagnosis not present

## 2020-12-19 DIAGNOSIS — C7B8 Other secondary neuroendocrine tumors: Secondary | ICD-10-CM | POA: Diagnosis not present

## 2020-12-19 DIAGNOSIS — C7A090 Malignant carcinoid tumor of the bronchus and lung: Secondary | ICD-10-CM | POA: Diagnosis not present

## 2020-12-19 DIAGNOSIS — Z5111 Encounter for antineoplastic chemotherapy: Secondary | ICD-10-CM | POA: Diagnosis not present

## 2020-12-19 DIAGNOSIS — C7A00 Malignant carcinoid tumor of unspecified site: Secondary | ICD-10-CM | POA: Diagnosis not present

## 2020-12-19 DIAGNOSIS — C7951 Secondary malignant neoplasm of bone: Secondary | ICD-10-CM | POA: Diagnosis not present

## 2020-12-19 DIAGNOSIS — C7A8 Other malignant neuroendocrine tumors: Secondary | ICD-10-CM | POA: Diagnosis not present

## 2020-12-19 DIAGNOSIS — C792 Secondary malignant neoplasm of skin: Secondary | ICD-10-CM | POA: Diagnosis not present

## 2020-12-19 DIAGNOSIS — Z79899 Other long term (current) drug therapy: Secondary | ICD-10-CM | POA: Diagnosis not present

## 2020-12-19 DIAGNOSIS — C3411 Malignant neoplasm of upper lobe, right bronchus or lung: Secondary | ICD-10-CM

## 2020-12-19 MED ORDER — PEGFILGRASTIM-CBQV 6 MG/0.6ML ~~LOC~~ SOSY
6.0000 mg | PREFILLED_SYRINGE | Freq: Once | SUBCUTANEOUS | Status: AC
  Administered 2020-12-19: 6 mg via SUBCUTANEOUS
  Filled 2020-12-19: qty 0.6

## 2020-12-19 NOTE — Telephone Encounter (Signed)
-----   Message from Melrose Nakayama, MD sent at 12/19/2020  1:58 PM EDT ----- Regarding: RE: Medication refill PCP ----- Message ----- From: Donnella Sham, RN Sent: 12/19/2020  12:32 PM EDT To: Melrose Nakayama, MD Subject: Medication refill                              Hey,  Patient is requesting refill of furosemide. Do you want to continue refilling? Or should this be sent to her PCP?  Thanks,  Caryl Pina

## 2020-12-19 NOTE — Progress Notes (Signed)
Katelyn [name] presents today for Udenyca injection per the provider's orders.  Stable during administration without incident; injection site WNL; see MAR for injection details.  Patient tolerated procedure well and without incident.  No questions or complaints noted at this time. Discharge from clinic via wheelchair in stable condition.  Alert and oriented X 3.  Follow up with [facility] as scheduled.

## 2020-12-19 NOTE — Telephone Encounter (Signed)
Patient cancelled echocardiogram due to she is having radiation and does not wish to reschedule at this time. Order will be removed from the echo WQ and if patient calls back to reschedule we will reinstate the order.

## 2020-12-19 NOTE — Patient Instructions (Signed)
[facility]  Discharge Instructions: Thank you for choosing [facility] to provide your oncology and hematology care.  If you have a lab appointment with the [facility], please come in thru the Main Entrance and check in at the main information desk.  Wear comfortable clothing and clothing appropriate for easy access to any Portacath or PICC line.   We strive to give you quality time with your provider. You may need to reschedule your appointment if you arrive late (15 or more minutes).  Arriving late affects you and other patients whose appointments are after yours.  Also, if you miss three or more appointments without notifying the office, you may be dismissed from the clinic at the provider's discretion.      For prescription refill requests, have your pharmacy contact our office and allow 72 hours for refills to be completed.    Today you received the following chemotherapy and/or immunotherapy agents Udenyca      To help prevent nausea and vomiting after your treatment, we encourage you to take your nausea medication as directed.  BELOW ARE SYMPTOMS THAT SHOULD BE REPORTED IMMEDIATELY: *FEVER GREATER THAN 100.4 F (38 C) OR HIGHER *CHILLS OR SWEATING *NAUSEA AND VOMITING THAT IS NOT CONTROLLED WITH YOUR NAUSEA MEDICATION *UNUSUAL SHORTNESS OF BREATH *UNUSUAL BRUISING OR BLEEDING *URINARY PROBLEMS (pain or burning when urinating, or frequent urination) *BOWEL PROBLEMS (unusual diarrhea, constipation, pain near the anus) TENDERNESS IN MOUTH AND THROAT WITH OR WITHOUT PRESENCE OF ULCERS (sore throat, sores in mouth, or a toothache) UNUSUAL RASH, SWELLING OR PAIN  UNUSUAL VAGINAL DISCHARGE OR ITCHING   Items with * indicate a potential emergency and should be followed up as soon as possible or go to the Emergency Department if any problems should occur.  Please show the CHEMOTHERAPY ALERT CARD or IMMUNOTHERAPY ALERT CARD at check-in to the Emergency  Department and triage nurse.  Should you have questions after your visit or need to cancel or reschedule your appointment, please contact [facility] [phone]  and follow the prompts.  Office hours are 8:00 a.m. to 4:30 p.m. Monday - Friday. Please note that voicemails left after 4:00 p.m. may not be returned until the following business day.  We are closed weekends and major holidays. You have access to a nurse at all times for urgent questions. Please call the main number to the clinic [phone] and follow the prompts.  For any non-urgent questions, you may also contact your provider using MyChart. We now offer e-Visits for anyone 82 and older to request care online for non-urgent symptoms. For details visit [email or web address].   Also download the MyChart app! Go to the app store, search "MyChart", open the app, select [facility], and log in with your MyChart username and password.  Due to Covid, a mask is required upon entering the hospital/clinic. If you do not have a mask, one will be given to you upon arrival. For doctor visits, patients may have 1 support person aged 28 or older with them. For treatment visits, patients cannot have anyone with them due to current Covid guidelines and our immunocompromised population.

## 2020-12-19 NOTE — Telephone Encounter (Signed)
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 Patient completed D1C1 pembrolizumab, paclitaxel, carboplatin on 10/18.   Spoke with patient via telephone. She reports continuing to feel some nervousness/shakiness after first chemotherapy. Patient thinks this may be related to pain medication for her shoulder and not been taking this. Patient reports taking tylenol. Per patient, she is scheduled for first palliative radiation treatment on 10/26. Patient is hoping this will help with her pain. Patient reports appetite is about the same and has not been eating much. She has not eaten today, planning to eat some oatmeal and banana in a few minutes. Patient has been drinking Ensure Plus, reports usually 4/day (350 kcal, 16 g protein each). Patient is not eating meat, the thought of it makes her stomach churn. Patient denies nausea, reports taking compazine every 6 hours and this is working well. She had some diarrhea after treatment, reports this has improved. She is taking Imodium as needed.   Medications: oxycodone, B12, folvite, reglan, klor-con, compazine  Labs: 10/18 - Glucose 128, Na 134, BUN 54, Cr 1.95  Anthropometrics: Last weight 154 lb 1.6 oz on 10/12 decreased 7 lbs (4.3%) in the last month. This is insignificant for time frame, however weights have decreased 25 lbs (14%) over the last 4 months which is significant.   9/14 - 161 lb  6/1 - 179 lb  NUTRITION DIAGNOSIS: Unintentional weight loss ongoing   INTERVENTION:  Reinforced importance of consuming adequate calories and protein to maintain weights, strength, nutrition Encouraged eating small frequent meals and snacks - pt has high protein snack ideas Discussed foods with protein, encouraged protein foods with all meals and snacks Discussed ways to add calories and protein to foods - will mail shake recipes Continue drinking 4 Ensure Plus/equivalent daily - will mail additional coupons Patient has contact  information    MONITORING, EVALUATION, GOAL: Patient will tolerate increased calories and protein to promote weight gain   NEXT VISIT: via telephone ~4 weeks

## 2020-12-21 ENCOUNTER — Other Ambulatory Visit: Payer: Self-pay | Admitting: Thoracic Surgery (Cardiothoracic Vascular Surgery)

## 2020-12-24 ENCOUNTER — Ambulatory Visit (INDEPENDENT_AMBULATORY_CARE_PROVIDER_SITE_OTHER): Payer: Medicare Other | Admitting: Cardiovascular Disease

## 2020-12-24 DIAGNOSIS — Z510 Encounter for antineoplastic radiation therapy: Secondary | ICD-10-CM | POA: Diagnosis not present

## 2020-12-24 DIAGNOSIS — Z7901 Long term (current) use of anticoagulants: Secondary | ICD-10-CM | POA: Diagnosis not present

## 2020-12-24 DIAGNOSIS — C3491 Malignant neoplasm of unspecified part of right bronchus or lung: Secondary | ICD-10-CM | POA: Diagnosis not present

## 2020-12-24 DIAGNOSIS — I4891 Unspecified atrial fibrillation: Secondary | ICD-10-CM | POA: Diagnosis not present

## 2020-12-24 DIAGNOSIS — Z5181 Encounter for therapeutic drug level monitoring: Secondary | ICD-10-CM

## 2020-12-24 DIAGNOSIS — C7A8 Other malignant neuroendocrine tumors: Secondary | ICD-10-CM | POA: Diagnosis not present

## 2020-12-24 DIAGNOSIS — C792 Secondary malignant neoplasm of skin: Secondary | ICD-10-CM | POA: Diagnosis not present

## 2020-12-24 DIAGNOSIS — C7B8 Other secondary neuroendocrine tumors: Secondary | ICD-10-CM | POA: Diagnosis not present

## 2020-12-24 DIAGNOSIS — C7951 Secondary malignant neoplasm of bone: Secondary | ICD-10-CM | POA: Diagnosis not present

## 2020-12-24 LAB — POCT INR: INR: 2 (ref 2.0–3.0)

## 2020-12-24 NOTE — Progress Notes (Signed)
Katelyn [name], Katelyn [name] [id number]   CLINIC:  Medical Oncology/Hematology  PCP:  Celene Squibb, MD [address] Katelyn [name] [place] [address] [phone]   REASON FOR VISIT:  Follow-up for right lung cancer  PRIOR THERAPY: Right upper lobectomy (08/15/20)  NGS Results: not done  CURRENT THERAPY: surveillance  BRIEF ONCOLOGIC HISTORY:  Oncology History Overview Note  Foundation One PD-L1 TPS score 1 16 mut/MB KRASG12C mutation MSI stable   Cancer of upper lobe of right lung ([place])  08/15/2020 Pathology Results   FINAL MICROSCOPIC DIAGNOSIS:   A. 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  - Tumor is limited to lung.  - Margins are negative for carcinoma.  - One of one lymph nodes negative for carcinoma (0/1).  - See oncology table.   B. LYMPH NODE, 7, EXCISION:  - One of one lymph nodes negative for carcinoma (0/1).   C. LYMPH NODE, 7 #2, EXCISION:  - One of one lymph nodes negative for carcinoma (0/1).   D. LYMPH NODE, 9, EXCISION:  - One of one lymph nodes negative for carcinoma (0/1).   E. LYMPH NODE, 8, EXCISION:  - One of one lymph nodes negative for carcinoma (0/1).   F. LYMPH NODE, 7 #3, EXCISION:  - One of one lymph nodes negative for carcinoma (0/1).   G. LYMPH NODE, 11, EXCISION:  - One of one lymph nodes negative for carcinoma (0/1).   H. LYMPH NODE, 10, EXCISION:  - One of one lymph nodes negative for carcinoma (0/1).   I. LYMPH NODE, 4R, EXCISION:  - One of one lymph nodes negative for carcinoma (0/1).   J. LYMPH NODE, 10 #2, EXCISION:   - One of one lymph nodes negative for carcinoma (0/1).  - Fibrosis and calcifications.   K. LYMPH NODE, 12, EXCISION:  - One of one lymph nodes negative for carcinoma (0/1).   L. LYMPH NODE, 12 #2, EXCISION:  - One of one lymph nodes negative for carcinoma (0/1).   ONCOLOGY TABLE:   LUNG: Resection   Synchronous  Tumors: Not applicable  Total Number of Primary Tumors: 1  Procedure: Right upper lobe lobectomy with lymph node biopsies  Specimen Laterality: Right  Tumor Focality: Unifocal  Tumor Site: Right upper lobe  Tumor Size: 4.4 cm  Histologic Type: Large cell neuroendocrine carcinoma  Visceral Pleura Invasion: Not identified  Direct Invasion of Adjacent Structures: No adjacent structures present  Lymphovascular Invasion: Not identified  Margins: All margins negative for invasive carcinoma       Closest Margin(s) to Invasive Carcinoma: 1.5 cm to bronchial margin  Treatment Effect: No known presurgical therapy  Regional Lymph Nodes:       Number of Lymph Nodes Involved: 0                            Nodal Sites with Tumor: Not applicable       Number of Lymph Nodes Examined: 12                       Nodal Sites Examined: 4, 7, 8, 9, 10, 11, 12  Distant Metastasis:       Distant Site(s) Involved: Not applicable  Pathologic Stage Classification (pTNM, AJCC 8th Edition): pT2b, pN0  Ancillary Studies: Can be performed upon request  Representative Tumor Block: A2  Comment(s):  The tumor has a subtle neuroendocrine features, cribriform areas with necrosis, and geographic areas of necrosis.  Immunohistochemistry is positive for CD56, synaptophysin, and TTF-1.  Chromogranin is largely negative. Ki-67 is elevated. Dr. Saralyn Pilar has reviewed the case.     08/15/2020 Surgery   PREOPERATIVE DIAGNOSIS:  Right upper lobe lung mass, suspected non-small cell carcinoma.   POSTOPERATIVE DIAGNOSIS:  Non-small cell carcinoma, right upper lobe, clinical stage IB (T2, N0).   PROCEDURE:   Xi robotic-assisted right upper lobectomy,  Lymph node dissection,  Intercostal nerve blocks levels 3 through 10.   SURGEON:  Modesto Charon, MD    FINDINGS:  Fragile tissue.  Enlarged but otherwise benign appearing nodes.  One calcified node.  Frozen section revealed non-small cell carcinoma.  Bronchial margin clear.    09/04/2020 Initial Diagnosis   Cancer of upper lobe of right lung ([place])   09/04/2020 Cancer Staging   Staging form: Lung, AJCC 8th Edition - Pathologic stage from 09/04/2020: pT2, pN0, cM0 - Signed by Heath Lark, MD on 09/04/2020 Stage prefix: Initial diagnosis    09/11/2020 Genetic Testing   PD-L1 Results:     12/11/2020 Cancer Staging   Staging form: Lung, AJCC 8th Edition - Pathologic stage from 12/11/2020: Stage IVB (pT2b, pN0, pM1c) - Signed by Derek Jack, MD on 12/11/2020 Histopathologic type: Large cell neuroendocrine carcinoma    12/17/2020 -  Chemotherapy   Patient is on Treatment Plan : LUNG NSCLC Carboplatin + Paclitaxel + Pembrolizumab q21d x 4 cycles / Pembrolizumab Maintenance Q21D       CANCER STAGING: Cancer Staging Cancer of upper lobe of right lung [facility]) Staging form: Lung, AJCC 8th Edition - Pathologic stage from 09/04/2020: pT2, pN0, cM0 - Signed by Heath Lark, MD on 09/04/2020 - Pathologic stage from 12/11/2020: Stage IVB (pT2b, pN0, pM1c) - Signed by Derek Jack, MD on 12/11/2020   INTERVAL HISTORY:  Katelyn [name], a 80 y.o. female, returns for routine follow-up of her right lung cancer. Vielka was last seen on 12/11/2020.   Today she reports feeling okay. She reports fatigue, weakness in her legs, and bleeding gums which is worsened when brushing her teeth. She reports pain and sores in her mouth. Her pain in her right shoulder has improved. She begins radiation therapy today. She reports water diarrhea 4-5 times daily starting 2 days after her last treatment on 10/18 for which she is taking 30 ml imodium. She denies nausea and vomiting. She reports stable numbness in her right hand.  REVIEW OF SYSTEMS:  Review of Systems  Constitutional:  Negative for appetite change (50%) and fatigue (50%).  HENT:   Positive for mouth sores and trouble swallowing.        Gums bleeding  Respiratory:  Positive for cough and shortness of breath.    Gastrointestinal:  Positive for diarrhea. Negative for nausea and vomiting.  Musculoskeletal:  Positive for arthralgias (4/10 R shoulder).  Neurological:  Positive for extremity weakness (legs) and numbness (R hand).  All other systems reviewed and are negative.  PAST MEDICAL/SURGICAL HISTORY:  Past Medical History:  Diagnosis Date   Allergy    rhinitis   Aortic stenosis, severe    a. s/p TAVR 08/2014.   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 April 2012 which revealed mid RCA occlusion with collaterals, 50% LAD stenosis, 30% circumflex stenosis, and 40% marginal stenosis   First degree AV block    GERD (gastroesophageal reflux disease)    Heart murmur    Hyperlipidemia    Hypertension    Left bundle branch block    LUMBAR RADICULOPATHY, RIGHT    PAF (paroxysmal atrial fibrillation) ([facility])    Peripheral vascular disease ([place])    a. s/p aortobifem bypass graft surgery and bilateral fundoplasty by [facility].   Renal artery stenosis ([place])    a. renal artery stenosis (>60% L renal artery) by duplex 04/2020.   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 bilateral subclavian stenosis with antegrade flow by duplex 07/2020   Past Surgical History:  Procedure Laterality Date   AORTO-FEMORAL BYPASS GRAFT Bilateral 06/27/10   BASAL CELL CARCINOMA EXCISION Left    face   CARDIAC CATHETERIZATION N/A 07/25/2014   Procedure: Right/Left Heart Cath and Coronary  Angiography;  Surgeon: Troy Sine, MD;  Location: [facility];  Service: Cardiovascular;  Laterality: N/A;   CATARACT EXTRACTION, BILATERAL Bilateral 09/29/2016   COLONOSCOPY  2008   Dr. Laural Golden: normal.    DILATION AND CURETTAGE OF UTERUS  "2 or 3"   ESOPHAGOGASTRODUODENOSCOPY N/A 08/18/2017   Dr. Gala Romney: Normal-appearing esophagus status post dilation.  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  Small hiatal hernia.   INTERCOSTAL NERVE BLOCK Right 08/15/2020   Procedure: INTERCOSTAL NERVE BLOCK RIGHT;  Surgeon: Melrose Nakayama, MD;  Location: [facility];  Service: Thoracic;  Laterality: Right;   IR THORACENTESIS ASP PLEURAL SPACE W/IMG GUIDE  10/07/2020   MALONEY DILATION N/A 08/18/2017   Procedure: Venia Minks DILATION;  Surgeon: Daneil Dolin, MD;  [facility];  Service: Endoscopy;  Laterality: N/A;   MULTIPLE TOOTH EXTRACTIONS     NODE DISSECTION Right 08/15/2020   Procedure: NODE DISSECTION;  Surgeon: Melrose Nakayama, MD;  Location: [facility];  Service: Thoracic;  Laterality: Right;   PR VEIN BYPASS GRAFT,AORTO-FEM-POP  06/12/10   TEE WITHOUT CARDIOVERSION N/A 08/31/2014   Procedure: TRANSESOPHAGEAL ECHOCARDIOGRAM (TEE);  Surgeon: Rexene Alberts, MD;  Location: [facility];  Service: Open Heart Surgery;  Laterality: N/A;   TRANSCATHETER AORTIC VALVE REPLACEMENT, TRANSFEMORAL N/A 08/31/2014   Procedure: TRANSCATHETER AORTIC VALVE REPLACEMENT, TRANSFEMORAL approach;  Surgeon: Rexene Alberts, MD;  Location: [facility];  Service: Open Heart Surgery;  Laterality: N/A;   TUBAL LIGATION  1970's   VAGINAL HYSTERECTOMY  1970's   Partial     SOCIAL HISTORY:  Social History   Socioeconomic History   Marital status: Widowed    Spouse name: bobby   Number of children: 1   Years of education: Not on file   Highest education level: High school graduate  Occupational History   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 Retired,used to work at [employer].Lives alone.         Patient has one daughter, 3 grands   Likes to crochet   Works in [employer].    Social Determinants of Health   Financial Resource Strain: Not on file  Food Insecurity: No Food Insecurity   Worried About Charity fundraiser in the Last Year: Never true   Ran Out of Food in the Last Year: Never true  Transportation Needs: No Transportation Needs   Lack of Transportation (Medical): No   Lack of Transportation (Non-Medical): No  Physical Activity: Not on file  Stress: Not on file  Social Connections: Not on file  Intimate Partner Violence: Not on file    FAMILY HISTORY:  Family History  Problem Relation Age of Onset   Heart disease Mother 54       died young   Varicose Veins Mother    Stroke Father    Hypertension Father    Heart disease Father 44       Aneurysm   Heart attack Father    Arthritis Brother    Stroke Brother    Diabetes Daughter    Arthritis Daughter    Heart attack Brother        unsure   Allergic rhinitis Neg Hx    Angioedema Neg Hx    Asthma Neg Hx    Atopy Neg Hx    Eczema Neg Hx    Immunodeficiency Neg Hx    Urticaria Neg Hx     CURRENT MEDICATIONS:  Current Outpatient Medications  Medication Sig Dispense Refill   amiodarone (PACERONE) 200 MG tablet Take 1 tablet by mouth once daily 90 tablet 3   CARBOPLATIN IV Inject into the vein every 21 ( twenty-one) days.     chlorthalidone (HYGROTON) 25 MG tablet Take 12.5 mg by mouth daily.     Cholecalciferol (VITAMIN D3) 50 MCG  (2000 UT) capsule Take 2,000 Units by mouth daily.     diltiazem (TIAZAC) 120 MG 24 hr capsule diltiazem CD 120 mg capsule,extended release 24 hr  TAKE 1 CAPSULE BY MOUTH ONCE DAILY     estradiol (ESTRACE) 2 MG tablet Take 1 tablet (2 mg total) by mouth daily. 90 tablet 0   folic acid (FOLVITE) 1 MG tablet Take 1 tablet (1 mg total) by mouth daily. 30 tablet 0   furosemide (LASIX) 20 MG tablet Take 1 tablet (20 mg total) by mouth daily. 30 tablet 1   ipratropium (ATROVENT) 0.06 % nasal spray USE 1 SPRAY(S) IN EACH NOSTRIL EVERY 6 HOURS AS NEEDED FOR RUNNY NOSE 15 mL 2   irbesartan (AVAPRO) 300 MG tablet Take 300 mg by mouth daily.     lidocaine-prilocaine (EMLA) cream Apply a small amount to port a cath site and cover with plastic wrap 1 hour prior to infusion appointments 30 g 3   meloxicam (MOBIC) 15 MG tablet Take 1 tablet by mouth once daily 30 tablet 3   metoCLOPramide (REGLAN) 5 MG tablet Take 2 tablets (10 mg total) by mouth 3 (three) times daily  before meals. 30 tablet 3   metoprolol succinate (TOPROL-XL) 25 MG 24 hr tablet Take by mouth.     nitroGLYCERIN (NITROSTAT) 0.4 MG SL tablet Place 1 tablet (0.4 mg total) under the tongue every 5 (five) minutes as needed for chest pain. 25 tablet 12   nitroGLYCERIN (NITROSTAT) 0.4 MG SL tablet nitroglycerin 0.4 mg sublingual tablet  DISSOLVE ONE TABLET UNDER THE TONGUE EVERY 5 MINUTES AS NEEDED FOR CHEST PAIN. DO NOT EXCEED A TOTAL OF 3 DOSES IN 15 MINUTES     ondansetron (ZOFRAN) 4 MG tablet ondansetron HCl 4 mg tablet  TAKE 1 TABLET BY MOUTH EVERY 8 HOURS AS NEEDED FOR NAUSEA OR VOMITING     oxyCODONE (OXY IR/ROXICODONE) 5 MG immediate release tablet Take 1 tablet (5 mg total) by mouth every 8 (eight) hours. 30 tablet 0   PACLITAXEL IV Inject into the vein every 21 ( twenty-one) days.     pantoprazole (PROTONIX) 40 MG tablet TAKE 1 TABLET BY MOUTH ONCE DAILY BEFORE SUPPER (Patient taking differently: Take 40 mg by mouth daily.) 90 tablet 3    Pembrolizumab (KEYTRUDA IV) Inject into the vein every 21 ( twenty-one) days.     potassium chloride (KLOR-CON) 10 MEQ tablet Take 1 tablet (10 mEq total) by mouth daily. 14 tablet 1   prochlorperazine (COMPAZINE) 10 MG tablet Take 1 tablet (10 mg total) by mouth every 6 (six) hours as needed (Nausea or vomiting). 30 tablet 1   rosuvastatin (CRESTOR) 20 MG tablet Take 1 tablet (20 mg total) by mouth at bedtime. 90 tablet 0   thyroid (ARMOUR) 30 MG tablet NP Thyroid 30 mg tablet  TAKE 60 MG BY MOUTH DAILY FOR 2 WEEKS, THEN TAKE 30 MG BY MOUTH DAILY FOR 2 WEEKS AND THEN STOP THE MEDICATION     warfarin (COUMADIN) 2.5 MG tablet Take 1 tablet (2.5 mg total) by mouth daily. 90 tablet 0   No current facility-administered medications for this visit.    ALLERGIES:  Allergies  Allergen Reactions   Adhesive [Tape] Rash   Latex Rash   Tetanus Toxoids Rash   Wound Dressing Adhesive Rash    PHYSICAL EXAM:  Performance status (ECOG): 2 - Symptomatic, <50% confined to bed  There were no vitals filed for this visit. Wt Readings from Last 3 Encounters:  12/11/20 154 lb 1.6 oz (69.9 kg)  12/10/20 156 lb (70.8 kg)  12/10/20 155 lb 9.6 oz (70.6 kg)   Physical Exam Vitals reviewed.  Constitutional:      Appearance: Normal appearance.     Comments: In wheelchair  HENT:     Mouth/Throat:     Mouth: Mucous membranes are moist. Oral lesions (mucositis) present.  Cardiovascular:     Rate and Rhythm: Normal rate and regular rhythm.     Pulses: Normal pulses.     Heart sounds: Normal heart sounds.  Pulmonary:     Effort: Pulmonary effort is normal.     Breath sounds: Normal breath sounds.  Neurological:     General: No focal deficit present.     Mental Status: She is alert and oriented to person, place, and time.  Psychiatric:        Mood and Affect: Mood normal.        Behavior: Behavior normal.     LABORATORY DATA:  I have reviewed the labs as listed.  CBC Latest Ref Rng & Units  12/17/2020 09/16/2020 08/26/2020  WBC 4.0 - 10.5 K/uL 13.8(H) 7.3 11.9(H)  Hemoglobin  12.0 - 15.0 g/dL 14.4 14.1 12.3  Hematocrit 36.0 - 46.0 % 46.2(H) 45.2 37.1  Platelets 150 - 400 K/uL 210 146(L) 191   CMP Latest Ref Rng & Units 12/17/2020 09/16/2020 08/26/2020  Glucose 70 - 99 mg/dL 128(H) 115(H) 115(H)  BUN 8 - 23 mg/dL 54(H) 15 19  Creatinine 0.44 - 1.00 mg/dL 1.95(H) 1.28(H) 1.29(H)  Sodium 135 - 145 mmol/L 134(L) 138 135  Potassium 3.5 - 5.1 mmol/L 4.5 4.3 3.9  Chloride 98 - 111 mmol/L 96(L) 102 101  CO2 22 - 32 mmol/L _0 Calcium 8.9 - 10.3 mg/dL 8.6(L) 8.5(L) 8.3(L)  Total Protein 6.5 - 8.1 g/dL 7.2 - -  Total Bilirubin 0.3 - 1.2 mg/dL 0.8 - -  Alkaline Phos 38 - 126 U/L 80 - -  AST 15 - 41 U/L 42(H) - -  ALT 0 - 44 U/L 19 - -    DIAGNOSTIC IMAGING:  I have independently reviewed the scans and discussed with the patient. DG Chest 2 View  Result Date: 12/10/2020 CLINICAL DATA:  Pleural effusion. EXAM: CHEST - 2 VIEW COMPARISON:  11/05/2020 FINDINGS: Hyperexpanded lungs with redemonstrated right effusion, which has decreased compared to the prior exam. No other focal pulmonary opacity. No left pleural effusion. Cardiac contours within normal limits. No acute osseous abnormality. IMPRESSION: Interval decrease in the size of the previously noted right pleural effusion, now small. Electronically Signed   By: Merilyn Baba M.D.   On: 12/10/2020 15:47   [place] CORE BIOPSY (SOFT TISSUE)  Result Date: 11/27/2020 INDICATION: 80 year old with history of large cell neuroendocrine tumor and status post right upper lobectomy. PET-CT imaging raises concern for metastatic disease. Concern for a lesion in the right shoulder and indeterminate subcutaneous nodules. EXAM: ULTRASOUND-GUIDED CORE BIOPSY OF RIGHT PELVIC SUBCUTANEOUS NODULE MEDICATIONS: None. ANESTHESIA/SEDATION: Moderate (conscious) sedation was employed during this procedure. A total of Versed 1.0 mg and Fentanyl 50 mcg was  administered intravenously. Moderate Sedation Time: 16 minutes. The patient's level of consciousness and vital signs were monitored continuously by radiology nursing throughout the procedure under my direct supervision. FLUOROSCOPY TIME:  None COMPLICATIONS: None immediate. PROCEDURE: Informed written consent was obtained from the patient after a thorough discussion of the procedural risks, benefits and alternatives. All questions were addressed. A timeout was performed prior to the initiation of the procedure. The area of concern in the right shoulder was evaluated with ultrasound but a lesion was not confidently identified. A focal heterogeneous nodule was identified in the lateral right pelvic subcutaneous tissues. This corresponds with the hypermetabolic lesion on previous PET-CT imaging. Right side of the pelvis was prepped with chlorhexidine. Sterile field was created. Skin and soft tissues were anesthetized with 1% lidocaine. Small incision was made. Using ultrasound guidance, an 18 gauge core needle was directed into the subcutaneous nodule. Total of 6 core biopsies were obtained. Specimens placed in formalin. Bandage placed over the puncture site. FINDINGS: Heterogeneous nodule in the right lateral pelvic subcutaneous tissues. Nodule measures 2.3 x 1.8 x 2.6 cm. IMPRESSION: Ultrasound-guided core biopsies of a subcutaneous nodule along the lateral right pelvis. Electronically Signed   By: Markus Daft M.D.   On: 11/27/2020 17:51     ASSESSMENT:  1.  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 - She reported COVID infection in December 2020 and has been fatigued since then. - Chest x-ray on 06/21/2020 showed masslike opacity in  the right lung. - CT chest with contrast on 06/28/2020 showed right upper lobe lung mass measuring 4 x 3.2 x 3.8 cm.  Small noncalcified mediastinal lymph nodes are all subcentimeter, nonspecific.   Small subpleural nodules in the right upper lobe measure up to 5 mm.  Clustered lingular nodules measuring up to 6 mm nonspecific.  Nonspecific 8 mm enhancing focus in the left lobe of the liver. - Right upper lobectomy and lymph node dissection on 08/15/2020. - Pathology consistent with 4.4 cm large cell neuroendocrine carcinoma, margins negative.  Lymph nodes at stations 7, 8, 9, 10, 11, 4R, 12 negative for metastatic disease.  No visceral pleural invasion or direct invasion of the adjacent structures.  No lymphovascular invasion.  IHC positive for CD56, synaptophysin and TTF-1.  Chromogranin negative.  Ki-67 elevated. - PET scan on 10/01/2020 with postoperative changes in the chest wall.  Intense hyper metabolism in the acromion with SUV 8.6.  Hypermetabolic focus in the right gluteal region favoring injection site. - Foundation 1 results showed TMB high, K-ras G12C mutation positive. - PD-L1 TPS 1%. - Right lateral pelvic soft tissue biopsy on 11/27/2020 consistent with metastatic neuroendocrine carcinoma, Ki-67 40-50%.   2.  Social/family history: - She lives by herself at home and is independent of ADLs and IADLs. - She smoked 2 packs/day for 56 years and quit in May 2012. - She worked in [employer]. - Her nephew had colon cancer.  No other malignancies in the family.   PLAN:  1.  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 - She received first cycle of carboplatin, paclitaxel and pembrolizumab on 12/17/2020. - She had some weakness and diarrhea. - Reviewed labs today which showed normal LFTs.  CBC shows platelet count 54 and white count 3.5 with normal ANC. - She has mucositis and occasional gum bleeding.  We will give her Magic mouthwash. - We will schedule her for IV fluids with electrolytes today. - We will reevaluate her next week in the symptom management  clinic for labs and possible fluids. - RTC 2 weeks with me for cycle 2 chemotherapy.   2.  Shortness of breath on exertion: - Right thoracentesis on 10/07/2020 with 1.7 L clear fluid removed.  Breathing has improved.  3.  Nausea: - Continue Reglan 5 mg 3 times daily AC which is helping.  4.  Multiple subcutaneous nodules: - She has multiple subcutaneous nodules in the lower back consistent with malignancy.  5.  Right shoulder pain: - Continue oxycodone 5 mg every 8 hours as needed. - She will start radiation therapy today.  6.  Diarrhea: - She is having diarrhea 4-5 stools per day which started about 2 days after chemotherapy. - Given the early onset, it did not strongly appear to me like immunotherapy induced colitis. - She is taking Imodium without much help. - We will start her on Lomotil 2 tablets at the first onset of diarrhea followed by 1 tablet after each watery bowel movement. - She was instructed that if her symptoms does not improve, call us immediately or go to the nearest emergency room.   Orders placed this encounter:  No orders of the defined types were placed in this encounter.    Derek Jack, MD [facility] [phone]   I, Thana Ates, am acting as a scribe for Dr. Derek Jack.  I,  Derek Jack MD, have reviewed the above documentation for accuracy and completeness, and I agree with the above.

## 2020-12-24 NOTE — Patient Instructions (Signed)
Description   Spoke with patient and instructed her to continue to take warfarin 1 tablet daily. Recheck INR in 1 week.

## 2020-12-25 ENCOUNTER — Ambulatory Visit (HOSPITAL_COMMUNITY): Payer: Medicare Other | Admitting: Hematology

## 2020-12-25 ENCOUNTER — Other Ambulatory Visit: Payer: Self-pay

## 2020-12-25 ENCOUNTER — Other Ambulatory Visit (HOSPITAL_COMMUNITY): Payer: Self-pay | Admitting: *Deleted

## 2020-12-25 ENCOUNTER — Other Ambulatory Visit (HOSPITAL_COMMUNITY): Payer: Medicare Other

## 2020-12-25 ENCOUNTER — Other Ambulatory Visit (HOSPITAL_COMMUNITY): Payer: Self-pay

## 2020-12-25 ENCOUNTER — Inpatient Hospital Stay (HOSPITAL_BASED_OUTPATIENT_CLINIC_OR_DEPARTMENT_OTHER): Payer: Medicare Other | Admitting: Hematology

## 2020-12-25 ENCOUNTER — Inpatient Hospital Stay (HOSPITAL_COMMUNITY): Payer: Medicare Other

## 2020-12-25 VITALS — BP 122/61 | HR 70 | Temp 97.8°F | Resp 18 | Wt 153.1 lb

## 2020-12-25 DIAGNOSIS — C3490 Malignant neoplasm of unspecified part of unspecified bronchus or lung: Secondary | ICD-10-CM

## 2020-12-25 DIAGNOSIS — C7A00 Malignant carcinoid tumor of unspecified site: Secondary | ICD-10-CM | POA: Diagnosis not present

## 2020-12-25 DIAGNOSIS — C7A8 Other malignant neuroendocrine tumors: Secondary | ICD-10-CM | POA: Diagnosis not present

## 2020-12-25 DIAGNOSIS — Z7901 Long term (current) use of anticoagulants: Secondary | ICD-10-CM | POA: Diagnosis not present

## 2020-12-25 DIAGNOSIS — C7B8 Other secondary neuroendocrine tumors: Secondary | ICD-10-CM | POA: Diagnosis not present

## 2020-12-25 DIAGNOSIS — Z79899 Other long term (current) drug therapy: Secondary | ICD-10-CM | POA: Diagnosis not present

## 2020-12-25 DIAGNOSIS — C3411 Malignant neoplasm of upper lobe, right bronchus or lung: Secondary | ICD-10-CM

## 2020-12-25 DIAGNOSIS — Z87891 Personal history of nicotine dependence: Secondary | ICD-10-CM | POA: Diagnosis not present

## 2020-12-25 DIAGNOSIS — C3491 Malignant neoplasm of unspecified part of right bronchus or lung: Secondary | ICD-10-CM | POA: Diagnosis not present

## 2020-12-25 DIAGNOSIS — Z5111 Encounter for antineoplastic chemotherapy: Secondary | ICD-10-CM | POA: Diagnosis not present

## 2020-12-25 DIAGNOSIS — C7951 Secondary malignant neoplasm of bone: Secondary | ICD-10-CM | POA: Diagnosis not present

## 2020-12-25 DIAGNOSIS — Z510 Encounter for antineoplastic radiation therapy: Secondary | ICD-10-CM | POA: Diagnosis not present

## 2020-12-25 DIAGNOSIS — C792 Secondary malignant neoplasm of skin: Secondary | ICD-10-CM | POA: Diagnosis not present

## 2020-12-25 DIAGNOSIS — C7A090 Malignant carcinoid tumor of the bronchus and lung: Secondary | ICD-10-CM | POA: Diagnosis not present

## 2020-12-25 LAB — COMPREHENSIVE METABOLIC PANEL
ALT: 28 U/L (ref 0–44)
AST: 27 U/L (ref 15–41)
Albumin: 3 g/dL — ABNORMAL LOW (ref 3.5–5.0)
Alkaline Phosphatase: 84 U/L (ref 38–126)
Anion gap: 10 (ref 5–15)
BUN: 23 mg/dL (ref 8–23)
CO2: 23 mmol/L (ref 22–32)
Calcium: 8.4 mg/dL — ABNORMAL LOW (ref 8.9–10.3)
Chloride: 99 mmol/L (ref 98–111)
Creatinine, Ser: 1.21 mg/dL — ABNORMAL HIGH (ref 0.44–1.00)
GFR, Estimated: 46 mL/min — ABNORMAL LOW (ref >60)
Glucose, Bld: 102 mg/dL — ABNORMAL HIGH (ref 70–99)
Potassium: 4.3 mmol/L (ref 3.5–5.1)
Sodium: 132 mmol/L — ABNORMAL LOW (ref 135–145)
Total Bilirubin: 0.7 mg/dL (ref 0.3–1.2)
Total Protein: 6.4 g/dL — ABNORMAL LOW (ref 6.5–8.1)

## 2020-12-25 LAB — CBC WITH DIFFERENTIAL/PLATELET
Band Neutrophils: 13 %
Basophils Absolute: 0 10*3/uL (ref 0.0–0.1)
Basophils Relative: 0 %
Eosinophils Absolute: 0.2 10*3/uL (ref 0.0–0.5)
Eosinophils Relative: 7 %
HCT: 42.9 % (ref 36.0–46.0)
Hemoglobin: 13.6 g/dL (ref 12.0–15.0)
Lymphocytes Relative: 19 %
Lymphs Abs: 0.7 10*3/uL (ref 0.7–4.0)
MCH: 29.3 pg (ref 26.0–34.0)
MCHC: 31.7 g/dL (ref 30.0–36.0)
MCV: 92.5 fL (ref 80.0–100.0)
Metamyelocytes Relative: 3 %
Monocytes Absolute: 0.8 10*3/uL (ref 0.1–1.0)
Monocytes Relative: 23 %
Neutro Abs: 1.7 10*3/uL (ref 1.7–7.7)
Neutrophils Relative %: 35 %
Platelets: 54 10*3/uL — ABNORMAL LOW (ref 150–400)
RBC: 4.64 MIL/uL (ref 3.87–5.11)
RDW: 15.4 % (ref 11.5–15.5)
Smear Review: NORMAL
WBC: 3.5 10*3/uL — ABNORMAL LOW (ref 4.0–10.5)
nRBC: 0 % (ref 0.0–0.2)

## 2020-12-25 LAB — MAGNESIUM: Magnesium: 1.5 mg/dL — ABNORMAL LOW (ref 1.7–2.4)

## 2020-12-25 MED ORDER — MISC. DEVICES MISC
0 refills | Status: DC
Start: 2020-12-25 — End: 2020-12-26

## 2020-12-25 MED ORDER — MAGNESIUM SULFATE 2 GM/50ML IV SOLN
2.0000 g | Freq: Once | INTRAVENOUS | Status: AC
  Administered 2020-12-25: 2 g via INTRAVENOUS
  Filled 2020-12-25: qty 50

## 2020-12-25 MED ORDER — DIPHENOXYLATE-ATROPINE 2.5-0.025 MG PO TABS: ORAL_TABLET | ORAL | 3 refills | Status: DC

## 2020-12-25 MED ORDER — POTASSIUM CHLORIDE IN NACL 20-0.9 MEQ/L-% IV SOLN
Freq: Once | INTRAVENOUS | Status: AC
Start: 2020-12-25 — End: 2020-12-25
  Filled 2020-12-25: qty 1000

## 2020-12-25 NOTE — Progress Notes (Signed)
Patient presents today for IVF with potassium and magnesium.  Patient is in stable condition with no new complaints voiced.  Vital signs are stable.  We will proceed with fluids per MD orders.   Patient tolerated fluids well with no complaints voiced.  Patient left via wheelchair in stable condition.  Vital signs stable at discharge.  Follow up as scheduled.

## 2020-12-25 NOTE — Patient Instructions (Signed)
[facility]  Discharge Instructions: Thank you for choosing [facility] to provide your oncology and hematology care.  If you have a lab appointment with the Cancer Center, please come in thru the Main Entrance and check in at the main information desk.  Wear comfortable clothing and clothing appropriate for easy access to any Portacath or PICC line.   We strive to give you quality time with your provider. You may need to reschedule your appointment if you arrive late (15 or more minutes).  Arriving late affects you and other patients whose appointments are after yours.  Also, if you miss three or more appointments without notifying the office, you may be dismissed from the clinic at the provider's discretion.      For prescription refill requests, have your pharmacy contact our office and allow 72 hours for refills to be completed.        To help prevent nausea and vomiting after your treatment, we encourage you to take your nausea medication as directed.  BELOW ARE SYMPTOMS THAT SHOULD BE REPORTED IMMEDIATELY: *FEVER GREATER THAN 100.4 F (38 C) OR HIGHER *CHILLS OR SWEATING *NAUSEA AND VOMITING THAT IS NOT CONTROLLED WITH YOUR NAUSEA MEDICATION *UNUSUAL SHORTNESS OF BREATH *UNUSUAL BRUISING OR BLEEDING *URINARY PROBLEMS (pain or burning when urinating, or frequent urination) *BOWEL PROBLEMS (unusual diarrhea, constipation, pain near the anus) TENDERNESS IN MOUTH AND THROAT WITH OR WITHOUT PRESENCE OF ULCERS (sore throat, sores in mouth, or a toothache) UNUSUAL RASH, SWELLING OR PAIN  UNUSUAL VAGINAL DISCHARGE OR ITCHING   Items with * indicate a potential emergency and should be followed up as soon as possible or go to the Emergency Department if any problems should occur.  Please show the CHEMOTHERAPY ALERT CARD or IMMUNOTHERAPY ALERT CARD at check-in to the Emergency Department and triage nurse.  Should you have questions after your visit or need to cancel  or reschedule your appointment, please contact [facility] [phone]  and follow the prompts.  Office hours are 8:00 a.m. to 4:30 p.m. Monday - Friday. Please note that voicemails left after 4:00 p.m. may not be returned until the following business day.  We are closed weekends and major holidays. You have access to a nurse at all times for urgent questions. Please call the main number to the clinic [phone] and follow the prompts.  For any non-urgent questions, you may also contact your provider using MyChart. We now offer e-Visits for anyone 18 and older to request care online for non-urgent symptoms. For details visit [email or web address].   Also download the MyChart app! Go to the app store, search "MyChart", open the app, select [facility], and log in with your MyChart username and password.  Due to Covid, a mask is required upon entering the hospital/clinic. If you do not have a mask, one will be given to you upon arrival. For doctor visits, patients may have 1 support person aged 18 or older with them. For treatment visits, patients cannot have anyone with them due to current Covid guidelines and our immunocompromised population.  

## 2020-12-25 NOTE — Patient Instructions (Addendum)
[facility] Discharge Instructions   You were seen and examined today by Dr. Delton Coombes.  You will receive IV fluids today for hydration, and to help correct your low magnesium.  We will send a prescription for magic mouthwash with lidocaine to help with the sores in your mouth.   We will send a stronger prescription to help control your diarrhea.   Return as scheduled for lab work, office visit, and treatment.    Thank you for choosing [facility] at [facility] to provide your oncology and hematology care.  To afford each patient quality time with our provider, please arrive at least 15 minutes before your scheduled appointment time.   If you have a lab appointment with the [facility] please come in thru the Main Entrance and check in at the main information desk.  You need to re-schedule your appointment should you arrive 10 or more minutes late.  We strive to give you quality time with our providers, and arriving late affects you and other patients whose appointments are after yours.  Also, if you no show three or more times for appointments you may be dismissed from the clinic at the providers discretion.     Again, thank you for choosing [facility].  Our hope is that these requests will decrease the amount of time that you wait before being seen by our physicians.       _____________________________________________________________  Should you have questions after your visit to [facility] please contact our office at [phone] and follow the prompts.  Our office hours are 8:00 a.m. and 4:30 p.m. Monday - Friday.  Please note that voicemails left after 4:00 p.m. may not be returned until the following business day.  We are closed weekends and major holidays.  You do have access to a nurse 24-7, just call the main number to the clinic [phone] and do not press any options, hold on the line  and a nurse will answer the phone.    For prescription refill requests, have your pharmacy contact our office and allow 72 hours.    Due to Covid, you will need to wear a mask upon entering the hospital. If you do not have a mask, a mask will be given to you at the Main Entrance upon arrival. For doctor visits, patients may have 1 support person age 41 or older with them. For treatment visits, patients can not have anyone with them due to social distancing guidelines and our immunocompromised population.

## 2020-12-26 ENCOUNTER — Other Ambulatory Visit (HOSPITAL_COMMUNITY): Payer: Self-pay | Admitting: *Deleted

## 2020-12-26 DIAGNOSIS — Z510 Encounter for antineoplastic radiation therapy: Secondary | ICD-10-CM | POA: Diagnosis not present

## 2020-12-26 DIAGNOSIS — C7A8 Other malignant neuroendocrine tumors: Secondary | ICD-10-CM | POA: Diagnosis not present

## 2020-12-26 DIAGNOSIS — C7B8 Other secondary neuroendocrine tumors: Secondary | ICD-10-CM | POA: Diagnosis not present

## 2020-12-26 MED ORDER — MISC. DEVICES MISC: 5.0000 mL | 1 refills | Status: DC | PRN

## 2020-12-27 ENCOUNTER — Other Ambulatory Visit (HOSPITAL_COMMUNITY): Payer: Medicare Other

## 2020-12-27 DIAGNOSIS — C7B8 Other secondary neuroendocrine tumors: Secondary | ICD-10-CM | POA: Diagnosis not present

## 2020-12-27 DIAGNOSIS — Z510 Encounter for antineoplastic radiation therapy: Secondary | ICD-10-CM | POA: Diagnosis not present

## 2020-12-27 DIAGNOSIS — C7A8 Other malignant neuroendocrine tumors: Secondary | ICD-10-CM | POA: Diagnosis not present

## 2020-12-30 DIAGNOSIS — C7A8 Other malignant neuroendocrine tumors: Secondary | ICD-10-CM | POA: Diagnosis not present

## 2020-12-30 DIAGNOSIS — C7B8 Other secondary neuroendocrine tumors: Secondary | ICD-10-CM | POA: Diagnosis not present

## 2020-12-30 DIAGNOSIS — Z510 Encounter for antineoplastic radiation therapy: Secondary | ICD-10-CM | POA: Diagnosis not present

## 2020-12-31 ENCOUNTER — Encounter: Payer: Self-pay | Admitting: General Surgery

## 2020-12-31 ENCOUNTER — Other Ambulatory Visit: Payer: Self-pay

## 2020-12-31 ENCOUNTER — Other Ambulatory Visit: Payer: Self-pay | Admitting: Thoracic Surgery (Cardiothoracic Vascular Surgery)

## 2020-12-31 ENCOUNTER — Ambulatory Visit (INDEPENDENT_AMBULATORY_CARE_PROVIDER_SITE_OTHER): Payer: Medicare Other | Admitting: Cardiovascular Disease

## 2020-12-31 ENCOUNTER — Ambulatory Visit (INDEPENDENT_AMBULATORY_CARE_PROVIDER_SITE_OTHER): Payer: Medicare Other | Admitting: General Surgery

## 2020-12-31 VITALS — BP 121/68 | HR 67 | Temp 98.1°F | Resp 14 | Ht 70.0 in | Wt 156.0 lb

## 2020-12-31 DIAGNOSIS — C3491 Malignant neoplasm of unspecified part of right bronchus or lung: Secondary | ICD-10-CM | POA: Diagnosis not present

## 2020-12-31 DIAGNOSIS — Z5181 Encounter for therapeutic drug level monitoring: Secondary | ICD-10-CM | POA: Diagnosis not present

## 2020-12-31 DIAGNOSIS — C7951 Secondary malignant neoplasm of bone: Secondary | ICD-10-CM | POA: Diagnosis not present

## 2020-12-31 DIAGNOSIS — I701 Atherosclerosis of renal artery: Secondary | ICD-10-CM

## 2020-12-31 DIAGNOSIS — C792 Secondary malignant neoplasm of skin: Secondary | ICD-10-CM | POA: Diagnosis not present

## 2020-12-31 DIAGNOSIS — Z510 Encounter for antineoplastic radiation therapy: Secondary | ICD-10-CM | POA: Diagnosis not present

## 2020-12-31 DIAGNOSIS — C7B8 Other secondary neuroendocrine tumors: Secondary | ICD-10-CM | POA: Diagnosis not present

## 2020-12-31 DIAGNOSIS — Z7901 Long term (current) use of anticoagulants: Secondary | ICD-10-CM

## 2020-12-31 DIAGNOSIS — C7A8 Other malignant neuroendocrine tumors: Secondary | ICD-10-CM | POA: Diagnosis not present

## 2020-12-31 DIAGNOSIS — C3411 Malignant neoplasm of upper lobe, right bronchus or lung: Secondary | ICD-10-CM | POA: Diagnosis not present

## 2020-12-31 LAB — POCT INR: INR: 2 (ref 2.0–3.0)

## 2020-12-31 NOTE — Patient Instructions (Signed)
Stop Coumadin on Sunday, 11/13.

## 2020-12-31 NOTE — Progress Notes (Signed)
[facility] [address], [place] [address] Phone: [phone] Fax: [facility] PROGRESS NOTE   Katelyn [name] [id number] August 26, 1940 80 y.o.  Katelyn [name] is managed by Dr. 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  Actively treated with chemotherapy/immunotherapy/hormonal therapy: yes  Current therapy: Carboplatin, paclitaxel, pembrolizumab  Last treated: 12/17/2020  Next scheduled appointment with provider: 01/07/2021  Subjective:  Chief Complaint: Weakness, diarrhea  Katelyn [name] is managed by Dr. 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  She received her first cycle of carboplatin, paclitaxel, and pembrolizumab on 12/17/2020.  She was seen by Dr. Delton Coombes on 12/25/2020, and was noted to have some weakness, nausea, and diarrhea as well as mucositis and gum bleeding.  She received IV fluids and electrolytes in clinic and was also given Magic mouthwash at that time.  She returns today for reevaluation in symptom management clinic.  She remains fatigued, with about 10% energy.  Reports that she is just barely able to walk to the bathroom and back before she needs to rest again.  Her diarrhea has improved.  She has not needed to take Lomotil for the past week. Her nausea and vomiting has improved; she is taking Reglan as needed with improvement in symptoms.  She continues to have right shoulder pain described as a 5/10 deep ache, that has been easing up somewhat after radiation.  She tries to avoid taking oxycodone, but reports that it effectively relieves her pain when she takes it.  She has not noted any new shortness of breath.  Her cough remains stable.  Her mouth pain and gum bleeding have resolved after taking Magic mouthwash.  She has not been having any pain or difficulty  with swallowing.  She does note excessive saliva and drooling for the past week.  She reports some new neuropathy described as some tingling in her fingers, rated as 4/10 in intensity.  Neuropathy began after her first cycle of chemotherapy.  She has been forcing herself to drink water, drinking about 40 to 50 ounces of water per day.  She has a poor appetite, and is eating about 25% of her normal food intake.  She drinks 2-3 ensures per day.  Her weight has been stable.  She reports that her hair is starting to fall out in clumps, which makes her sad.  She has slightly increased peripheral edema, for which she takes Lasix at home.   Review of Systems:  Review of Systems  Constitutional:  Positive for activity change, appetite change and fatigue. Negative for chills, diaphoresis, fever and unexpected weight change.  HENT:  Positive for drooling. Negative for mouth sores, sore throat and trouble swallowing.   Respiratory:  Positive for cough. Negative for chest tightness and shortness of breath.   Cardiovascular:  Positive for leg swelling. Negative for chest pain.  Gastrointestinal:  Positive for diarrhea (resolved x 1 week) and nausea (improved). Negative for constipation and vomiting.  Musculoskeletal:  Positive for arthralgias (right shoulder).  Neurological:  Positive for numbness (tingling in bilateral hands). Negative for dizziness and headaches.  Psychiatric/Behavioral:  Negative for dysphoric mood. The patient is not nervous/anxious.     Past Medical History, Surgical history, Social history, and Family history were reviewed as documented elsewhere in chart, and were updated as appropriate.   Objective:   Physical Exam:  There were no vitals taken for this visit. ECOG: 3   Physical Exam Constitutional:      Appearance: Normal appearance.     Comments: Weak-appearing.  Present in wheelchair.   Thin body habitus but without cachexia.  HENT:     Head: Normocephalic and  atraumatic.     Mouth/Throat:     Mouth: Mucous membranes are moist.  Eyes:     Extraocular Movements: Extraocular movements intact.     Pupils: Pupils are equal, round, and reactive to light.  Cardiovascular:     Rate and Rhythm: Normal rate and regular rhythm.     Pulses: Normal pulses.     Heart sounds: Murmur (faint systolic murmur) heard.  Pulmonary:     Effort: Pulmonary effort is normal.     Breath sounds: Rhonchi (faint rhonchi / coarse breath sounds in right lung base) present.  Abdominal:     General: Bowel sounds are normal.     Palpations: Abdomen is soft.     Tenderness: There is no abdominal tenderness.  Musculoskeletal:        General: No swelling.     Right lower leg: Edema (2+ pitting) present.     Left lower leg: Edema (2+ pitting) present.     Comments: Decreased right arm ROM secondary to shoulder pain  Lymphadenopathy:     Cervical: No cervical adenopathy.  Skin:    General: Skin is warm and dry.  Neurological:     General: No focal deficit present.     Mental Status: She is alert and oriented to person, place, and time.  Psychiatric:        Mood and Affect: Mood normal.        Behavior: Behavior normal.    Lab Review:     Component Value Date/Time   NA 132 (L) 12/25/2020 0904   NA 137 03/20/2020 1039   K 4.3 12/25/2020 0904   CL 99 12/25/2020 0904   CO2 23 12/25/2020 0904   GLUCOSE 102 (H) 12/25/2020 0904   BUN 23 12/25/2020 0904   BUN 31 (H) 03/20/2020 1039   CREATININE 1.21 (H) 12/25/2020 0904   CREATININE 1.31 (H) 06/26/2020 1040   CALCIUM 8.4 (L) 12/25/2020 0904   PROT 6.4 (L) 12/25/2020 0904   PROT 6.5 11/21/2018 1146   ALBUMIN 3.0 (L) 12/25/2020 0904   ALBUMIN 4.1 11/21/2018 1146   AST 27 12/25/2020 0904   ALT 28 12/25/2020 0904   ALKPHOS 84 12/25/2020 0904   BILITOT 0.7 12/25/2020 0904   BILITOT 0.6 11/21/2018 1146   GFRNONAA 46 (L) 12/25/2020 0904   GFRNONAA 39 (L) 06/26/2020 1040   GFRAA 45 (L) 06/26/2020 1040        Component Value Date/Time   WBC 3.5 (L) 12/25/2020 0904   RBC 4.64 12/25/2020 0904   HGB 13.6 12/25/2020 0904   HGB 15.4 05/17/2018 0913   HCT 42.9 12/25/2020 0904   HCT 47.0 (H) 05/17/2018 0913   PLT 54 (L) 12/25/2020 0904   PLT 163 05/17/2018 0913   MCV 92.5 12/25/2020 0904   MCV 92 05/17/2018 0913   MCH 29.3 12/25/2020 0904   MCHC 31.7 12/25/2020 0904   RDW 15.4 12/25/2020 0904   RDW 13.2 05/17/2018 0913   LYMPHSABS 0.7 12/25/2020 0904   MONOABS 0.8 12/25/2020 0904   EOSABS 0.2 12/25/2020 0904   BASOSABS 0.0 12/25/2020 0904   -------------------------------  Imaging from last 24 hours (if applicable):  Radiology interpretation: DG Chest 2 View  Result Date: 12/10/2020 CLINICAL DATA:  Pleural effusion. EXAM: CHEST - 2 VIEW COMPARISON:  11/05/2020 FINDINGS: Hyperexpanded lungs with redemonstrated right effusion, which has decreased compared to the prior exam. No other focal pulmonary opacity. No left pleural effusion. Cardiac contours within normal limits. No acute osseous abnormality. IMPRESSION: Interval decrease in the size of the previously noted right pleural effusion, now small. Electronically Signed   By: Merilyn Baba M.D.   On: 12/10/2020 15:47      Assessment & Plan:    1.  Metastatic large cell neuroendocrine carcinoma of the right upper lobe: - Managed by Dr. Delton Coombes - She received first cycle of carboplatin, paclitaxel and pembrolizumab on 12/17/2020. - Labs reviewed today (01/01/2021) show baseline kidney function (CKD stage III with creatinine 1.29 and GFR 42) and normal LFTs apart from mildly elevated alk phos 150. - CBC (01/01/2021) shows mild leukocytosis with WBC 11.2 (received G-CSF on 12/19/2020), normal Hgb 13.7, normal platelets 161 - PLAN: Scheduled for next visit with Dr. Delton Coombes and cycle #2 of chemotherapy next week on 01/07/2021.  2.  Dehydration & electrolyte imbalances - She is drinking 40 to 50 ounces of water daily at home - Normal blood  pressure and heart rate, no major electrolyte imbalances requiring IV correction. - Mild hypomagnesemia with magnesium 1.6.  Potassium is normal at 4.7. MAGNESIUM 1.6 --> start oral supplement daily - PLAN: No indication for IV fluids today.  Continue to encourage adequate oral intake (64 ounces daily). - Continue potassium 10 mEq daily - Prescription sent for daily magnesium supplement  3.  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 - She was taking Imodium without help, but diarrhea improved with Lomotil. - Diarrhea resolved about 2 weeks after her chemotherapy. - She denies any diarrhea for the past 1 week. - PLAN: Patient instructed to take Lomotil as prescribed if she has any recurrent diarrhea (Lomotil 2 tablets at first onset of diarrhea followed by 1 tablet after each watery bowel movement).  She is instructed to call our office or go to the emergency room if she has any severe diarrhea that does not improve with Lomotil.  4.  Right shoulder pain: - She finished radiation therapy yesterday and reports some improvement in pain - PLAN: Continue oxycodone 5 mg every 8 hours as needed  5.  Nausea: - Reports that her nausea and vomiting are currently well controlled. - PLAN: Continue Reglan 5 mg 3 times daily before meals as needed for nausea.  6.  Mucositis - Onset of mucositis and occasional gum bleeding after her first cycle of chemotherapy, relieved by Magic Mouthwash. - PLAN: Continue Magic Mouthwash as needed.  7.  Shortness of breath on exertion: - Right thoracentesis on 10/07/2020 with 1.7 L clear fluid removed.  Breathing has improved. - Stable cough.  No new shortness of breath today. - PLAN: Instructed to call our office or proceed to the nearest emergency room if she has any worsening shortness of breath or dyspnea.  8.  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  9.  Poor appetite -  Patient reports her appetite is about 20 to 25% of usual. - No weight loss noted at today's visit. - She is drinking 2-3 Ensure beverages per day. - PLAN: Patient encouraged to continue drinking 2-3 Ensure beverages per day.  Try eating more frequent and smaller meals.  If worsening appetite or any significant weight loss, would consider appetite stimulant and referral to dietitian.  10.  Sialorrhea - Patient notes excessive drooling after resolution of her mucositis - She does not want any medication to help with drooling at this time - PLAN: Patient encouraged to call our office if her symptoms get worse or if she decides she would like to try scopolamine patch for alleviation of symptoms.   PLAN SUMMARY & DISPOSITION: Continue follow-up with Dr. Delton Coombes as previously scheduled for 01/07/2021 with labs and cycle #2 of chemotherapy.  All questions were answered. The patient knows to call the clinic with any problems, questions or concerns.  Medical decision making: Moderate  Time spent on visit: I spent 25 minutes counseling the patient face to face. The total time spent in the appointment was 40 minutes and more than 50% was on counseling.   Harriett Rush, PA-C  01/02/2019 2:11 AM

## 2021-01-01 ENCOUNTER — Inpatient Hospital Stay (HOSPITAL_COMMUNITY): Payer: Medicare Other | Attending: Hematology

## 2021-01-01 ENCOUNTER — Inpatient Hospital Stay (HOSPITAL_BASED_OUTPATIENT_CLINIC_OR_DEPARTMENT_OTHER): Payer: Medicare Other | Admitting: Physician Assistant

## 2021-01-01 DIAGNOSIS — C3411 Malignant neoplasm of upper lobe, right bronchus or lung: Secondary | ICD-10-CM

## 2021-01-01 DIAGNOSIS — K521 Toxic gastroenteritis and colitis: Secondary | ICD-10-CM

## 2021-01-01 DIAGNOSIS — Z79899 Other long term (current) drug therapy: Secondary | ICD-10-CM | POA: Insufficient documentation

## 2021-01-01 DIAGNOSIS — Z87891 Personal history of nicotine dependence: Secondary | ICD-10-CM | POA: Insufficient documentation

## 2021-01-01 DIAGNOSIS — Z5111 Encounter for antineoplastic chemotherapy: Secondary | ICD-10-CM | POA: Insufficient documentation

## 2021-01-01 DIAGNOSIS — C7A090 Malignant carcinoid tumor of the bronchus and lung: Secondary | ICD-10-CM | POA: Diagnosis not present

## 2021-01-01 DIAGNOSIS — T451X5A Adverse effect of antineoplastic and immunosuppressive drugs, initial encounter: Secondary | ICD-10-CM

## 2021-01-01 DIAGNOSIS — C7A00 Malignant carcinoid tumor of unspecified site: Secondary | ICD-10-CM | POA: Insufficient documentation

## 2021-01-01 DIAGNOSIS — Z923 Personal history of irradiation: Secondary | ICD-10-CM | POA: Insufficient documentation

## 2021-01-01 LAB — COMPREHENSIVE METABOLIC PANEL
ALT: 22 U/L (ref 0–44)
AST: 29 U/L (ref 15–41)
Albumin: 3.1 g/dL — ABNORMAL LOW (ref 3.5–5.0)
Alkaline Phosphatase: 150 U/L — ABNORMAL HIGH (ref 38–126)
Anion gap: 9 (ref 5–15)
BUN: 17 mg/dL (ref 8–23)
CO2: 28 mmol/L (ref 22–32)
Calcium: 8.5 mg/dL — ABNORMAL LOW (ref 8.9–10.3)
Chloride: 101 mmol/L (ref 98–111)
Creatinine, Ser: 1.29 mg/dL — ABNORMAL HIGH (ref 0.44–1.00)
GFR, Estimated: 42 mL/min — ABNORMAL LOW (ref >60)
Glucose, Bld: 118 mg/dL — ABNORMAL HIGH (ref 70–99)
Potassium: 4.7 mmol/L (ref 3.5–5.1)
Sodium: 138 mmol/L (ref 135–145)
Total Bilirubin: 0.6 mg/dL (ref 0.3–1.2)
Total Protein: 6.3 g/dL — ABNORMAL LOW (ref 6.5–8.1)

## 2021-01-01 LAB — CBC WITH DIFFERENTIAL/PLATELET
Abs Immature Granulocytes: 0.23 10*3/uL — ABNORMAL HIGH (ref 0.00–0.07)
Basophils Absolute: 0.2 10*3/uL — ABNORMAL HIGH (ref 0.0–0.1)
Basophils Relative: 2 %
Eosinophils Absolute: 0 10*3/uL (ref 0.0–0.5)
Eosinophils Relative: 0 %
HCT: 42.8 % (ref 36.0–46.0)
Hemoglobin: 13.7 g/dL (ref 12.0–15.0)
Immature Granulocytes: 2 %
Lymphocytes Relative: 5 %
Lymphs Abs: 0.6 10*3/uL — ABNORMAL LOW (ref 0.7–4.0)
MCH: 29.7 pg (ref 26.0–34.0)
MCHC: 32 g/dL (ref 30.0–36.0)
MCV: 92.6 fL (ref 80.0–100.0)
Monocytes Absolute: 0.8 10*3/uL (ref 0.1–1.0)
Monocytes Relative: 7 %
Neutro Abs: 9.4 10*3/uL — ABNORMAL HIGH (ref 1.7–7.7)
Neutrophils Relative %: 84 %
Platelets: 161 10*3/uL (ref 150–400)
RBC: 4.62 MIL/uL (ref 3.87–5.11)
RDW: 17.3 % — ABNORMAL HIGH (ref 11.5–15.5)
WBC: 11.2 10*3/uL — ABNORMAL HIGH (ref 4.0–10.5)
nRBC: 0 % (ref 0.0–0.2)

## 2021-01-01 LAB — MAGNESIUM: Magnesium: 1.6 mg/dL — ABNORMAL LOW (ref 1.7–2.4)

## 2021-01-01 MED ORDER — MG-PLUS PROTEIN 133 MG PO TABS: 1.0000 | ORAL_TABLET | Freq: Two times a day (BID) | ORAL | 3 refills | Status: DC

## 2021-01-01 NOTE — Progress Notes (Addendum)
Katelyn [name]; [id number]; 07-10-1940   HPI Patient is a 80 year old white female who was referred to my care by Dr. Delton Coombes of oncology for Port-A-Cath placement.  She has newly diagnosed right upper lobe lung carcinoma and is in need of central venous access for chemotherapy.  She has been receiving some chemotherapy.  Her last infusion was 1 week ago.  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  She is also on Coumadin for history of DVTs. Past Medical History:  Diagnosis Date   Allergy    rhinitis   Aortic stenosis, severe    a. s/p TAVR 08/2014.   Arthritis    spine and various joints   Cancer of upper lobe of right lung ([place]) 09/04/2020   Carotid artery disease ([place])    Cataract    Cerebrovascular disease 12/05/2018   Childhood asthma    Chronic bronchitis ([place])        Claudication ([place])    COPD GOLD II 07/18/2020   Coronary artery disease    a. April 2012 which revealed mid RCA occlusion with collaterals, 50% LAD stenosis, 30% circumflex stenosis, and 40% marginal stenosis   First degree AV block    GERD (gastroesophageal reflux disease)    Heart murmur    Hyperlipidemia    Hypertension    Left bundle branch block    LUMBAR RADICULOPATHY, RIGHT    PAF (paroxysmal atrial fibrillation) (HCC)    Peripheral vascular disease ([place])    a. s/p aortobifem bypass graft surgery and bilateral fundoplasty by [facility].   Renal artery stenosis ([place])    a. renal artery stenosis (>60% L renal artery) by duplex 04/2020.   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 bilateral subclavian stenosis with antegrade flow by duplex 07/2020    Past Surgical History:  Procedure Laterality Date   AORTO-FEMORAL BYPASS GRAFT Bilateral  06/27/10   BASAL CELL CARCINOMA EXCISION Left    face   CARDIAC CATHETERIZATION N/A 07/25/2014   Procedure: Right/Left Heart Cath and Coronary Angiography;  Surgeon: Troy Sine, MD;  Location: [facility];  Service: Cardiovascular;  Laterality: N/A;   CATARACT EXTRACTION, BILATERAL Bilateral 09/29/2016   COLONOSCOPY  2008   Dr. Laural Golden: normal.    DILATION AND CURETTAGE OF UTERUS  "2 or 3"   ESOPHAGOGASTRODUODENOSCOPY N/A 08/18/2017   Dr. Gala Romney: Normal-appearing esophagus status post dilation.  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  Small hiatal hernia.   INTERCOSTAL NERVE BLOCK Right 08/15/2020   Procedure: INTERCOSTAL NERVE BLOCK RIGHT;  Surgeon: Melrose Nakayama, MD;  Location: [facility];  Service: Thoracic;  Laterality: Right;   IR THORACENTESIS ASP PLEURAL SPACE W/IMG GUIDE  10/07/2020   MALONEY DILATION N/A 08/18/2017   Procedure: Venia Minks DILATION;  Surgeon: Daneil Dolin, MD;  [facility];  Service: Endoscopy;  Laterality: N/A;   MULTIPLE TOOTH EXTRACTIONS     NODE DISSECTION Right 08/15/2020   Procedure: NODE DISSECTION;  Surgeon: Melrose Nakayama, MD;  Location: [facility];  Service: Thoracic;  Laterality: Right;   PR VEIN BYPASS GRAFT,AORTO-FEM-POP  06/12/10   TEE WITHOUT CARDIOVERSION N/A 08/31/2014   Procedure: TRANSESOPHAGEAL ECHOCARDIOGRAM (TEE);  Surgeon: Rexene Alberts,  MD;  [facility];  Service: Open Heart Surgery;  Laterality: N/A;   TRANSCATHETER AORTIC VALVE REPLACEMENT, TRANSFEMORAL N/A 08/31/2014   Procedure: TRANSCATHETER AORTIC VALVE REPLACEMENT, TRANSFEMORAL approach;  Surgeon: Rexene Alberts, MD;  Location: [facility];  Service: Open Heart Surgery;  Laterality: N/A;   TUBAL LIGATION  1970's   VAGINAL HYSTERECTOMY  1970's   Partial     Family History  Problem Relation Age of Onset   Heart disease Mother 86       died young   Varicose Veins Mother    Stroke Father    Hypertension Father    Heart disease Father 47       Aneurysm   Heart  attack Father    Arthritis Brother    Stroke Brother    Diabetes Daughter    Arthritis Daughter    Heart attack Brother        unsure   Allergic rhinitis Neg Hx    Angioedema Neg Hx    Asthma Neg Hx    Atopy Neg Hx    Eczema Neg Hx    Immunodeficiency Neg Hx    Urticaria Neg Hx     Current Outpatient Medications on File Prior to Visit  Medication Sig Dispense Refill   amiodarone (PACERONE) 200 MG tablet Take 1 tablet by mouth once daily 90 tablet 3   CARBOPLATIN IV Inject into the vein every 21 ( twenty-one) days.     chlorthalidone (HYGROTON) 25 MG tablet Take 12.5 mg by mouth daily.     Cholecalciferol (VITAMIN D3) 50 MCG (2000 UT) capsule Take 2,000 Units by mouth daily.     diphenoxylate-atropine (LOMOTIL) 2.5-0.025 MG tablet Take 2 pills at the onset of diarrhea, and then 1 tablet after each episode of diarrhea. 60 tablet 3   estradiol (ESTRACE) 2 MG tablet Take 1 tablet (2 mg total) by mouth daily. 90 tablet 0   furosemide (LASIX) 20 MG tablet Take 1 tablet (20 mg total) by mouth daily. 30 tablet 1   ipratropium (ATROVENT) 0.06 % nasal spray USE 1 SPRAY(S) IN EACH NOSTRIL EVERY 6 HOURS AS NEEDED FOR RUNNY NOSE 15 mL 2   irbesartan (AVAPRO) 300 MG tablet Take 300 mg by mouth daily.     lidocaine-prilocaine (EMLA) cream Apply a small amount to port a cath site and cover with plastic wrap 1 hour prior to infusion appointments 30 g 3   meloxicam (MOBIC) 15 MG tablet Take 1 tablet by mouth once daily 30 tablet 3   metoCLOPramide (REGLAN) 5 MG tablet Take 2 tablets (10 mg total) by mouth 3 (three) times daily before meals. 30 tablet 3   metoprolol succinate (TOPROL-XL) 25 MG 24 hr tablet Take by mouth.     Misc. Devices MISC 5-10 mLs by Mouth Rinse route every 4 (four) hours as needed (Swish and spit). Please provide patient with 1:1 Magic Mouthwash and Viscous Lidocaine 120 mL 1   nitroGLYCERIN (NITROSTAT) 0.4 MG SL tablet  (Patient not taking: Reported on 01/01/2021)     oxyCODONE  (OXY IR/ROXICODONE) 5 MG immediate release tablet Take 1 tablet (5 mg total) by mouth every 8 (eight) hours. 30 tablet 0   PACLITAXEL IV Inject into the vein every 21 ( twenty-one) days.     pantoprazole (PROTONIX) 40 MG tablet TAKE 1 TABLET BY MOUTH ONCE DAILY BEFORE SUPPER (Patient taking differently: Take 40 mg by mouth daily.) 90 tablet 3   Pembrolizumab (KEYTRUDA IV) Inject into the vein  every 21 ( twenty-one) days.     potassium chloride (KLOR-CON) 10 MEQ tablet Take 1 tablet (10 mEq total) by mouth daily. 14 tablet 1   prochlorperazine (COMPAZINE) 10 MG tablet Take 1 tablet (10 mg total) by mouth every 6 (six) hours as needed (Nausea or vomiting). (Patient not taking: Reported on 01/01/2021) 30 tablet 1   rosuvastatin (CRESTOR) 20 MG tablet Take 1 tablet (20 mg total) by mouth at bedtime. 90 tablet 0   thyroid (ARMOUR) 30 MG tablet NP Thyroid 30 mg tablet  TAKE 60 MG BY MOUTH DAILY FOR 2 WEEKS, THEN TAKE 30 MG BY MOUTH DAILY FOR 2 WEEKS AND THEN STOP THE MEDICATION     warfarin (COUMADIN) 2.5 MG tablet Take 1 tablet (2.5 mg total) by mouth daily. 90 tablet 0   No current facility-administered medications on file prior to visit.    Allergies  Allergen Reactions   Adhesive [Tape] Rash   Latex Rash   Tetanus Toxoids Rash   Wound Dressing Adhesive Rash    Social History   Substance and Sexual Activity  Alcohol Use No    Social History   Tobacco Use  Smoking Status Former   Packs/day: 2.00   Years: 56.00   Pack years: 112.00   Types: Cigarettes   Start date: 03/02/1952   Quit date: 06/01/2010   Years since quitting: 10.5  Smokeless Tobacco Never    Review of Systems  Constitutional:  Positive for malaise/fatigue.  HENT: Negative.    Eyes: Negative.   Respiratory:  Positive for cough.   Cardiovascular: Negative.   Gastrointestinal:  Positive for nausea.  Genitourinary: Negative.   Musculoskeletal:  Positive for joint pain.  Skin: Negative.   Neurological: Negative.    Endo/Heme/Allergies:  Bruises/bleeds easily.  Psychiatric/Behavioral: Negative.     Objective   Vitals:   12/31/20 1324  BP: 121/68  Pulse: 67  Resp: 14  Temp: 98.1 F (36.7 C)  SpO2: 92%    Physical Exam Vitals reviewed.  Constitutional:      Appearance: Normal appearance. She is normal weight. She is not ill-appearing.  HENT:     Head: Normocephalic and atraumatic.  Cardiovascular:     Rate and Rhythm: Normal rate and regular rhythm.     Heart sounds: Normal heart sounds. No murmur heard.   No friction rub. No gallop.  Pulmonary:     Effort: No respiratory distress.     Breath sounds: No stridor. No wheezing, rhonchi or rales.  Skin:    General: Skin is warm and dry.  Neurological:     Mental Status: She is alert and oriented to person, place, and time.   Oncology notes reviewed Assessment  Right lung carcinoma, need for central venous access, chronic anticoagulation, post chemotherapy thrombocytopenia Plan  I discussed with Dr. Delton Coombes who states that the platelet count should normalize in another 2 to 3 weeks.  Have scheduled a Port-A-Cath placement on 01/15/2021.  She will have blood work the day before to check her platelet count.  She was instructed to stop her Coumadin 3 days prior to the procedure.  Risks and benefits of the procedure including bleeding, infection, and pneumothorax were fully explained to the patient, who gave informed consent.

## 2021-01-01 NOTE — Patient Instructions (Addendum)
[place] at [facility] Discharge Instructions  You were seen today by Tarri Abernethy PA-C for your post-chemotherapy symptom management visit.  Please see the instructions below regarding your various symptoms.  - DIARRHEA: Continue to take Lomotil as needed for diarrhea.  Take 2 tablets with first onset of diarrhea followed by 1 tablet after each watery bowel movement.  - NAUSEA: Continue to take Reglan 5 mg before meals as needed for nausea, up to 3 times daily.  - RIGHT SHOULDER PAIN: Continue to take oxycodone 5 mg every 8 hours as needed.  Please call our office if you have severe pain that is not relieved by your oxycodone.  - MOUTH SORES: Continue Magic mouthwash as needed for mouth pain or sores in your mouth.  - NEUROPATHY: The numbness and tingling in your hands is a common side effect from chemotherapy, but there are medications available to help with this if you decide that you would like to try something to help with this symptom.  Please call our office if your neuropathy worsens or if you would like some medication to relieve your numbness/tingling.  - SHORTNESS OF BREATH: Please call our office if you have any new or worsening shortness of breath, since this may be a sign that you have fluid building up in your lungs again.  - HYDRATION: Make sure that you are drinking about 64 ounces of water per day.  Drink at least 3 of your 20 ounce water bottles per day.  - NUTRITION: Drink at least 3 Ensure beverages per day.  You may want to add ice cream to them to make a milkshake at home once per day.  Try eating more frequent and smaller meals throughout the day.  It is extremely important that you maintain adequate nutrition while you undergo cancer treatment.  - LOW MAGNESIUM: Start taking magnesium supplement once per day.  Prescription has been sent to your pharmacy.  - DROOLING: Excessive saliva (called "sialorrhea") is a less common side effect of  chemotherapy, but can be bothersome.  There are medications available to help with this if you decide you would like to try something to relieve this symptom.  - FATIGUE: Try to be as active as you are able to, but allow yourself to get plenty of rest in between activities.  **If you experience any NEW OR WORSENING SYMPTOMS before your next appointment, please call our office to receive advice from nursing staff, or to schedule a same-day symptom management visit.  If your symptoms are severe or you are unable to get a hold of our staff, please proceed to the emergency department.  FOLLOW-UP APPOINTMENT: You are scheduled for repeat labs, office visit with Dr. Delton Coombes, and her next cycle of chemotherapy to start on Tuesday, 01/07/2021.  _____________________________________________________________  Thank you for choosing [place] at [facility] to provide your oncology and hematology care.  To afford each patient quality time with our provider, please arrive at least 15 minutes before your scheduled appointment time.   If you have a lab appointment with the [facility] please come in thru the Main Entrance and check in at the main information desk.  You need to re-schedule your appointment should you arrive 10 or more minutes late.  We strive to give you quality time with our providers, and arriving late affects you and other patients whose appointments are after yours.  Also, if you no show three or more times for appointments  you may be dismissed from the clinic at the providers discretion.     Again, thank you for choosing [facility].  Our hope is that these requests will decrease the amount of time that you wait before being seen by our physicians.       _____________________________________________________________  Should you have questions after your visit to [facility], please contact our office at [phone] and follow the  prompts.  Our office hours are 8:00 a.m. and 4:30 p.m. Monday - Friday.  Please note that voicemails left after 4:00 p.m. may not be returned until the following business day.  We are closed weekends and major holidays.  You do have access to a nurse 24-7, just call the main number to the clinic [phone] and do not press any options, hold on the line and a nurse will answer the phone.    For prescription refill requests, have your pharmacy contact our office and allow 72 hours.    Due to Covid, you will need to wear a mask upon entering the hospital. If you do not have a mask, a mask will be given to you at the Main Entrance upon arrival. For doctor visits, patients may have 1 support person age 52 or older with them. For treatment visits, patients can not have anyone with them due to social distancing guidelines and our immunocompromised population.

## 2021-01-01 NOTE — H&P (Signed)
Katelyn [name]; [id number]; November 11, 1940   HPI Patient is a 80 year old white female who was referred to my care by Dr. Delton Coombes of oncology for Port-A-Cath placement.  She has newly diagnosed right upper lobe lung carcinoma and is in need of central venous access for chemotherapy.  She has been receiving some chemotherapy.  Her last infusion was 1 week ago.  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  She is also on Coumadin for history of DVTs. Past Medical History:  Diagnosis Date   Allergy    rhinitis   Aortic stenosis, severe    a. s/p TAVR 08/2014.   Arthritis    spine and various joints   Cancer of upper lobe of right lung ([place]) 09/04/2020   Carotid artery disease ([place])    Cataract    Cerebrovascular disease 12/05/2018   Childhood asthma    Chronic bronchitis ([place])        Claudication ([place])    COPD GOLD II 07/18/2020   Coronary artery disease    a. April 2012 which revealed mid RCA occlusion with collaterals, 50% LAD stenosis, 30% circumflex stenosis, and 40% marginal stenosis   First degree AV block    GERD (gastroesophageal reflux disease)    Heart murmur    Hyperlipidemia    Hypertension    Left bundle branch block    LUMBAR RADICULOPATHY, RIGHT    PAF (paroxysmal atrial fibrillation) (HCC)    Peripheral vascular disease ([place])    a. s/p aortobifem bypass graft surgery and bilateral fundoplasty by [facility].   Renal artery stenosis ([place])    a. renal artery stenosis (>60% L renal artery) by duplex 04/2020.   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) 1982 X 2   "a little bit weaker on the left side since" (08/29/2014)   Subclavian artery stenosis (HCC)    a. bilateral subclavian stenosis with antegrade flow by duplex 07/2020    Past Surgical History:  Procedure Laterality Date   AORTO-FEMORAL BYPASS GRAFT Bilateral  06/27/10   BASAL CELL CARCINOMA EXCISION Left    face   CARDIAC CATHETERIZATION N/A 07/25/2014   Procedure: Right/Left Heart Cath and Coronary Angiography;  Surgeon: Troy Sine, MD;  Location: [place] [facility];  Service: Cardiovascular;  Laterality: N/A;   CATARACT EXTRACTION, BILATERAL Bilateral 09/29/2016   COLONOSCOPY  2008   Dr. Laural Golden: normal.    DILATION AND CURETTAGE OF UTERUS  "2 or 3"   ESOPHAGOGASTRODUODENOSCOPY N/A 08/18/2017   Dr. Gala Romney: Normal-appearing esophagus status post dilation.  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  Small hiatal hernia.   INTERCOSTAL NERVE BLOCK Right 08/15/2020   Procedure: INTERCOSTAL NERVE BLOCK RIGHT;  Surgeon: Melrose Nakayama, MD;  Location: [place];  Service: Thoracic;  Laterality: Right;   IR THORACENTESIS ASP PLEURAL SPACE W/IMG GUIDE  10/07/2020   MALONEY DILATION N/A 08/18/2017   Procedure: Venia Minks DILATION;  Surgeon: Daneil Dolin, MD;  [facility];  Service: Endoscopy;  Laterality: N/A;   MULTIPLE TOOTH EXTRACTIONS     NODE DISSECTION Right 08/15/2020   Procedure: NODE DISSECTION;  Surgeon: Melrose Nakayama, MD;  Location: [place];  Service: Thoracic;  Laterality: Right;   PR VEIN BYPASS GRAFT,AORTO-FEM-POP  06/12/10   TEE WITHOUT CARDIOVERSION N/A 08/31/2014   Procedure: TRANSESOPHAGEAL ECHOCARDIOGRAM (TEE);  Surgeon: Rexene Alberts,  MD;  [facility];  Service: Open Heart Surgery;  Laterality: N/A;   TRANSCATHETER AORTIC VALVE REPLACEMENT, TRANSFEMORAL N/A 08/31/2014   Procedure: TRANSCATHETER AORTIC VALVE REPLACEMENT, TRANSFEMORAL approach;  Surgeon: Rexene Alberts, MD;  Location: [place];  Service: Open Heart Surgery;  Laterality: N/A;   TUBAL LIGATION  1970's   VAGINAL HYSTERECTOMY  1970's   Partial     Family History  Problem Relation Age of Onset   Heart disease Mother 42       died young   Varicose Veins Mother    Stroke Father    Hypertension Father    Heart disease Father 49       Aneurysm   Heart  attack Father    Arthritis Brother    Stroke Brother    Diabetes Daughter    Arthritis Daughter    Heart attack Brother        unsure   Allergic rhinitis Neg Hx    Angioedema Neg Hx    Asthma Neg Hx    Atopy Neg Hx    Eczema Neg Hx    Immunodeficiency Neg Hx    Urticaria Neg Hx     Current Outpatient Medications on File Prior to Visit  Medication Sig Dispense Refill   amiodarone (PACERONE) 200 MG tablet Take 1 tablet by mouth once daily 90 tablet 3   CARBOPLATIN IV Inject into the vein every 21 ( twenty-one) days.     chlorthalidone (HYGROTON) 25 MG tablet Take 12.5 mg by mouth daily.     Cholecalciferol (VITAMIN D3) 50 MCG (2000 UT) capsule Take 2,000 Units by mouth daily.     diphenoxylate-atropine (LOMOTIL) 2.5-0.025 MG tablet Take 2 pills at the onset of diarrhea, and then 1 tablet after each episode of diarrhea. 60 tablet 3   estradiol (ESTRACE) 2 MG tablet Take 1 tablet (2 mg total) by mouth daily. 90 tablet 0   furosemide (LASIX) 20 MG tablet Take 1 tablet (20 mg total) by mouth daily. 30 tablet 1   ipratropium (ATROVENT) 0.06 % nasal spray USE 1 SPRAY(S) IN EACH NOSTRIL EVERY 6 HOURS AS NEEDED FOR RUNNY NOSE 15 mL 2   irbesartan (AVAPRO) 300 MG tablet Take 300 mg by mouth daily.     lidocaine-prilocaine (EMLA) cream Apply a small amount to port a cath site and cover with plastic wrap 1 hour prior to infusion appointments 30 g 3   meloxicam (MOBIC) 15 MG tablet Take 1 tablet by mouth once daily 30 tablet 3   metoCLOPramide (REGLAN) 5 MG tablet Take 2 tablets (10 mg total) by mouth 3 (three) times daily before meals. 30 tablet 3   metoprolol succinate (TOPROL-XL) 25 MG 24 hr tablet Take by mouth.     Misc. Devices MISC 5-10 mLs by Mouth Rinse route every 4 (four) hours as needed (Swish and spit). Please provide patient with 1:1 Magic Mouthwash and Viscous Lidocaine 120 mL 1   nitroGLYCERIN (NITROSTAT) 0.4 MG SL tablet  (Patient not taking: Reported on 01/01/2021)     oxyCODONE  (OXY IR/ROXICODONE) 5 MG immediate release tablet Take 1 tablet (5 mg total) by mouth every 8 (eight) hours. 30 tablet 0   PACLITAXEL IV Inject into the vein every 21 ( twenty-one) days.     pantoprazole (PROTONIX) 40 MG tablet TAKE 1 TABLET BY MOUTH ONCE DAILY BEFORE SUPPER (Patient taking differently: Take 40 mg by mouth daily.) 90 tablet 3   Pembrolizumab (KEYTRUDA IV) Inject into the vein  every 21 ( twenty-one) days.     potassium chloride (KLOR-CON) 10 MEQ tablet Take 1 tablet (10 mEq total) by mouth daily. 14 tablet 1   prochlorperazine (COMPAZINE) 10 MG tablet Take 1 tablet (10 mg total) by mouth every 6 (six) hours as needed (Nausea or vomiting). (Patient not taking: Reported on 01/01/2021) 30 tablet 1   rosuvastatin (CRESTOR) 20 MG tablet Take 1 tablet (20 mg total) by mouth at bedtime. 90 tablet 0   thyroid (ARMOUR) 30 MG tablet NP Thyroid 30 mg tablet  TAKE 60 MG BY MOUTH DAILY FOR 2 WEEKS, THEN TAKE 30 MG BY MOUTH DAILY FOR 2 WEEKS AND THEN STOP THE MEDICATION     warfarin (COUMADIN) 2.5 MG tablet Take 1 tablet (2.5 mg total) by mouth daily. 90 tablet 0   No current facility-administered medications on file prior to visit.    Allergies  Allergen Reactions   Adhesive [Tape] Rash   Latex Rash   Tetanus Toxoids Rash   Wound Dressing Adhesive Rash    Social History   Substance and Sexual Activity  Alcohol Use No    Social History   Tobacco Use  Smoking Status Former   Packs/day: 2.00   Years: 56.00   Pack years: 112.00   Types: Cigarettes   Start date: 03/02/1952   Quit date: 06/01/2010   Years since quitting: 10.5  Smokeless Tobacco Never    Review of Systems  Constitutional:  Positive for malaise/fatigue.  HENT: Negative.    Eyes: Negative.   Respiratory:  Positive for cough.   Cardiovascular: Negative.   Gastrointestinal:  Positive for nausea.  Genitourinary: Negative.   Musculoskeletal:  Positive for joint pain.  Skin: Negative.   Neurological: Negative.    Endo/Heme/Allergies:  Bruises/bleeds easily.  Psychiatric/Behavioral: Negative.     Objective   Vitals:   12/31/20 1324  BP: 121/68  Pulse: 67  Resp: 14  Temp: 98.1 F (36.7 C)  SpO2: 92%    Physical Exam Vitals reviewed.  Constitutional:      Appearance: Normal appearance. She is normal weight. She is not ill-appearing.  HENT:     Head: Normocephalic and atraumatic.  Cardiovascular:     Rate and Rhythm: Normal rate and regular rhythm.     Heart sounds: Normal heart sounds. No murmur heard.   No friction rub. No gallop.  Pulmonary:     Effort: No respiratory distress.     Breath sounds: No stridor. No wheezing, rhonchi or rales.  Skin:    General: Skin is warm and dry.  Neurological:     Mental Status: She is alert and oriented to person, place, and time.   Oncology notes reviewed Assessment  Right lung carcinoma, need for central venous access, chronic anticoagulation, post chemotherapy thrombocytopenia Plan  I discussed with Dr. Delton Coombes who states that the platelet count should normalize in another 2 to 3 weeks.  Have scheduled a Port-A-Cath placement on 01/15/2021.  She will have blood work the day before to check her platelet count.  She was instructed to stop her Coumadin 3 days prior to the procedure.  Risks and benefits of the procedure including bleeding, infection, and pneumothorax were fully explained to the patient, who gave informed consent.

## 2021-01-06 LAB — POCT INR: INR: 2.1 (ref 2.0–3.0)

## 2021-01-06 NOTE — Progress Notes (Signed)
Katelyn [name], [place] [id number]   CLINIC:  Medical Oncology/Hematology  PCP:  Katelyn Squibb, MD [address] Katelyn [name] [place] [address] [phone]   REASON FOR VISIT:  Follow-up for right lung cancer  PRIOR THERAPY: Right upper lobectomy (08/15/20)  NGS Results: not done  CURRENT THERAPY: surveillance  BRIEF ONCOLOGIC HISTORY:  Oncology History Overview Note  Foundation One PD-L1 TPS score 1 16 mut/MB KRASG12C mutation MSI stable   Cancer of upper lobe of right lung (Katelyn [name])  08/15/2020 Pathology Results   FINAL MICROSCOPIC DIAGNOSIS:   A. 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  - Tumor is limited to lung.  - Margins are negative for carcinoma.  - One of one lymph nodes negative for carcinoma (0/1).  - See oncology table.   B. LYMPH NODE, 7, EXCISION:  - One of one lymph nodes negative for carcinoma (0/1).   C. LYMPH NODE, 7 #2, EXCISION:  - One of one lymph nodes negative for carcinoma (0/1).   D. LYMPH NODE, 9, EXCISION:  - One of one lymph nodes negative for carcinoma (0/1).   E. LYMPH NODE, 8, EXCISION:  - One of one lymph nodes negative for carcinoma (0/1).   F. LYMPH NODE, 7 #3, EXCISION:  - One of one lymph nodes negative for carcinoma (0/1).   G. LYMPH NODE, 11, EXCISION:  - One of one lymph nodes negative for carcinoma (0/1).   H. LYMPH NODE, 10, EXCISION:  - One of one lymph nodes negative for carcinoma (0/1).   I. LYMPH NODE, 4R, EXCISION:  - One of one lymph nodes negative for carcinoma (0/1).   J. LYMPH NODE, 10 #2, EXCISION:   - One of one lymph nodes negative for carcinoma (0/1).  - Fibrosis and calcifications.   K. LYMPH NODE, 12, EXCISION:  - One of one lymph nodes negative for carcinoma (0/1).   L. LYMPH NODE, 12 #2, EXCISION:  - One of one lymph nodes negative for carcinoma (0/1).   ONCOLOGY TABLE:   LUNG: Resection   Synchronous  Tumors: Not applicable  Total Number of Primary Tumors: 1  Procedure: Right upper lobe lobectomy with lymph node biopsies  Specimen Laterality: Right  Tumor Focality: Unifocal  Tumor Site: Right upper lobe  Tumor Size: 4.4 cm  Histologic Type: Large cell neuroendocrine carcinoma  Visceral Pleura Invasion: Not identified  Direct Invasion of Adjacent Structures: No adjacent structures present  Lymphovascular Invasion: Not identified  Margins: All margins negative for invasive carcinoma       Closest Margin(s) to Invasive Carcinoma: 1.5 cm to bronchial margin  Treatment Effect: No known presurgical therapy  Regional Lymph Nodes:       Number of Lymph Nodes Involved: 0                            Nodal Sites with Tumor: Not applicable       Number of Lymph Nodes Examined: 12                       Nodal Sites Examined: 4, 7, 8, 9, 10, 11, 12  Distant Metastasis:       Distant Site(s) Involved: Not applicable  Pathologic Stage Classification (pTNM, AJCC 8th Edition): pT2b, pN0  Ancillary Studies: Can be performed upon request  Representative Tumor Block: A2  Comment(s):  The tumor has a subtle neuroendocrine features, cribriform areas with necrosis, and geographic areas of necrosis.  Immunohistochemistry is positive for CD56, synaptophysin, and TTF-1.  Chromogranin is largely negative. Ki-67 is elevated. Dr. Saralyn Pilar has reviewed the case.     08/15/2020 Surgery   PREOPERATIVE DIAGNOSIS:  Right upper lobe lung mass, suspected non-small cell carcinoma.   POSTOPERATIVE DIAGNOSIS:  Non-small cell carcinoma, right upper lobe, clinical stage IB (T2, N0).   PROCEDURE:   Xi robotic-assisted right upper lobectomy,  Lymph node dissection,  Intercostal nerve blocks levels 3 through 10.   SURGEON:  Modesto Charon, MD    FINDINGS:  Fragile tissue.  Enlarged but otherwise benign appearing nodes.  One calcified node.  Frozen section revealed non-small cell carcinoma.  Bronchial margin clear.    09/04/2020 Initial Diagnosis   Cancer of upper lobe of right lung ([place])   09/04/2020 Cancer Staging   Staging form: Lung, AJCC 8th Edition - Pathologic stage from 09/04/2020: pT2, pN0, cM0 - Signed by Heath Lark, MD on 09/04/2020 Stage prefix: Initial diagnosis    09/11/2020 Genetic Testing   PD-L1 Results:     12/11/2020 Cancer Staging   Staging form: Lung, AJCC 8th Edition - Pathologic stage from 12/11/2020: Stage IVB (pT2b, pN0, pM1c) - Signed by Derek Jack, MD on 12/11/2020 Histopathologic type: Large cell neuroendocrine carcinoma    12/17/2020 -  Chemotherapy   Patient is on Treatment Plan : LUNG NSCLC Carboplatin + Paclitaxel + Pembrolizumab q21d x 4 cycles / Pembrolizumab Maintenance Q21D       CANCER STAGING: Cancer Staging Cancer of upper lobe of right lung [facility]) Staging form: Lung, AJCC 8th Edition - Pathologic stage from 09/04/2020: pT2, pN0, cM0 - Signed by Heath Lark, MD on 09/04/2020 - Pathologic stage from 12/11/2020: Stage IVB (pT2b, pN0, pM1c) - Signed by Derek Jack, MD on 12/11/2020   INTERVAL HISTORY:  Katelyn [name], a 80 y.o. female, returns for routine follow-up and consideration for next cycle of chemotherapy. Katelyn [name] was last seen on 12/25/2020.  Due for cycle #2 of Carboplatin, Taxol, and Keytruda today.   Overall, she tells me she has been feeling pretty well. She reports improved right shoulder pain upon completion of radiation last week for which she is taking 5 mg oxycodone at night. She also reports hair thinning. She denies nausea, SOB, diarrhea. Her appetite is fair and she drinks 3 Ensure+ daily with one meal. She reports constant tingling in her fingers, but she denies any associated pain or numbness. She spends most of the day sitting down.   Overall, she feels ready for next cycle of chemo today.   REVIEW OF SYSTEMS:  Review of Systems  Constitutional:  Positive for appetite change (40%) and fatigue (20%).  Respiratory:   Positive for cough. Negative for shortness of breath.   Gastrointestinal:  Negative for diarrhea and nausea.  Musculoskeletal:  Positive for arthralgias (6/10 R shoulder improved).  Neurological:  Positive for dizziness and numbness (tingling in fingers).  All other systems reviewed and are negative.  PAST MEDICAL/SURGICAL HISTORY:  Past Medical History:  Diagnosis Date   Allergy    rhinitis   Aortic stenosis, severe    a. s/p TAVR 08/2014.   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 April 2012 which revealed mid RCA occlusion with collaterals, 50% LAD stenosis, 30% circumflex stenosis, and 40% marginal stenosis   First degree AV block    GERD (gastroesophageal reflux disease)    Heart murmur    Hyperlipidemia    Hypertension    Left bundle branch block    LUMBAR RADICULOPATHY, RIGHT    PAF (paroxysmal atrial fibrillation) (H[facility])    Peripheral vascular disease ([place])    a. s/p aortobifem bypass graft surgery and bilateral fundoplasty by [facility].   Renal artery stenosis ([place])    a. renal artery stenosis (>60% L renal artery) by duplex 04/2020.   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 bilateral subclavian stenosis with antegrade flow by duplex 07/2020   Past Surgical History:  Procedure Laterality Date   AORTO-FEMORAL BYPASS GRAFT Bilateral 06/27/10   BASAL CELL CARCINOMA EXCISION Left    face   CARDIAC CATHETERIZATION N/A 07/25/2014   Procedure:  Right/Left Heart Cath and Coronary Angiography;  Surgeon: Troy Sine, MD;  Location: [facility];  Service: Cardiovascular;  Laterality: N/A;   CATARACT EXTRACTION, BILATERAL Bilateral 09/29/2016   COLONOSCOPY  2008   Dr. Laural Golden: normal.    DILATION AND CURETTAGE OF UTERUS  "2 or 3"   ESOPHAGOGASTRODUODENOSCOPY N/A 08/18/2017   Dr. Gala Romney: Normal-appearing esophagus status post dilation.  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  Small hiatal hernia.   INTERCOSTAL NERVE BLOCK Right 08/15/2020   Procedure: INTERCOSTAL NERVE BLOCK RIGHT;  Surgeon: Melrose Nakayama, MD;  Location: [facility];  Service: Thoracic;  Laterality: Right;   IR THORACENTESIS ASP PLEURAL SPACE W/IMG GUIDE  10/07/2020   MALONEY DILATION N/A 08/18/2017   Procedure: Venia Minks DILATION;  Surgeon: Daneil Dolin, MD;  [facility];  Service: Endoscopy;  Laterality: N/A;   MULTIPLE TOOTH EXTRACTIONS     NODE DISSECTION Right 08/15/2020   Procedure: NODE DISSECTION;  Surgeon: Melrose Nakayama, MD;  Location: [facility];  Service: Thoracic;  Laterality: Right;   PR VEIN BYPASS GRAFT,AORTO-FEM-POP  06/12/10   TEE WITHOUT CARDIOVERSION N/A 08/31/2014   Procedure: TRANSESOPHAGEAL ECHOCARDIOGRAM (TEE);  Surgeon: Rexene Alberts, MD;  Location: [facility];  Service: Open Heart Surgery;  Laterality: N/A;   TRANSCATHETER AORTIC VALVE REPLACEMENT, TRANSFEMORAL N/A 08/31/2014   Procedure: TRANSCATHETER AORTIC VALVE REPLACEMENT, TRANSFEMORAL approach;  Surgeon: Rexene Alberts, MD;  Location: [facility];  Service: Open Heart Surgery;  Laterality: N/A;   TUBAL LIGATION  1970's   VAGINAL HYSTERECTOMY  1970's   Partial     SOCIAL HISTORY:  Social History   Socioeconomic History   Marital status: Widowed    Spouse name: bobby   Number of children: 1   Years of education: Not on file   Highest education level: High school graduate  Occupational History   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 Retired,used to work at [employer].Lives alone.         Patient has one daughter, 3 grands   Likes to crochet   Works in [employer].    Social Determinants of Health   Financial Resource Strain: Not on file  Food Insecurity: No Food Insecurity   Worried About Charity fundraiser in the Last Year: Never true   Ran Out of Food in the Last Year: Never true  Transportation Needs: No Transportation Needs   Lack of Transportation (Medical): No   Lack of Transportation (Non-Medical): No  Physical Activity: Not on file  Stress: Not on file  Social Connections: Not on file  Intimate Partner Violence: Not on file    FAMILY HISTORY:  Family History  Problem Relation Age of Onset   Heart disease Mother 59       died young   Varicose Veins Mother    Stroke Father    Hypertension Father    Heart disease Father 17       Aneurysm   Heart attack Father    Arthritis Brother    Stroke Brother    Diabetes Daughter    Arthritis Daughter    Heart attack Brother        unsure   Allergic rhinitis Neg Hx    Angioedema Neg Hx    Asthma Neg Hx    Atopy Neg Hx    Eczema Neg Hx    Immunodeficiency Neg Hx    Urticaria Neg Hx     CURRENT MEDICATIONS:  Current Outpatient Medications  Medication Sig Dispense Refill   amiodarone (PACERONE) 200 MG tablet Take 1 tablet by mouth once daily 90 tablet 3   CARBOPLATIN IV Inject into the vein every 21 ( twenty-one) days.     chlorthalidone (HYGROTON) 25 MG tablet Take 12.5 mg by mouth daily.      diphenoxylate-atropine (LOMOTIL) 2.5-0.025 MG tablet Take 2 pills at the onset of diarrhea, and then 1 tablet after each episode of diarrhea. 60 tablet 3   estradiol (ESTRACE) 2 MG tablet Take 1 tablet (2 mg total) by mouth daily. 90 tablet 0   furosemide (LASIX) 20 MG tablet Take 1 tablet (20 mg total) by mouth daily. 30 tablet 1   ipratropium (ATROVENT) 0.06 % nasal spray USE 1 SPRAY(S) IN EACH NOSTRIL EVERY 6 HOURS AS NEEDED FOR RUNNY NOSE 15 mL 2   irbesartan (AVAPRO) 300 MG tablet Take 300 mg by mouth daily.     lidocaine-prilocaine (EMLA) cream Apply a small amount to port a cath site and cover with plastic wrap 1 hour prior to infusion appointments 30 g 3   meloxicam (MOBIC) 15 MG tablet Take 1 tablet by mouth once daily 30 tablet 3   metoCLOPramide (REGLAN) 5 MG tablet Take 2 tablets (10 mg total) by mouth 3 (three) times daily before meals. 30 tablet 3   metoprolol succinate (TOPROL-XL) 25 MG 24 hr tablet Take by mouth.     Misc. Devices MISC 5-10 mLs by Mouth Rinse route every 4 (four) hours as needed (Swish  and spit). Please provide patient with 1:1 Magic Mouthwash and Viscous Lidocaine 120 mL 1   oxyCODONE (OXY IR/ROXICODONE) 5 MG immediate release tablet Take 1 tablet (5 mg total) by mouth every 8 (eight) hours. 30 tablet 0   PACLITAXEL IV Inject into the vein every 21 ( twenty-one) days.     pantoprazole (PROTONIX) 40 MG tablet TAKE 1 TABLET BY MOUTH ONCE DAILY BEFORE SUPPER (Patient taking differently: Take 40 mg by mouth daily.) 90 tablet 3   Pembrolizumab (KEYTRUDA IV) Inject into the vein every 21 ( twenty-one) days.     potassium chloride (KLOR-CON) 10 MEQ tablet Take 1 tablet (10 mEq total) by mouth daily. 14 tablet 1   rosuvastatin (CRESTOR) 20 MG tablet Take 1 tablet (20 mg total) by mouth at bedtime. 90 tablet 0   Specialty Vitamins Products (MAGNESIUM, AMINO ACID CHELATE,) 133 MG tablet Take 1 tablet by mouth 2 (two) times daily. 60 tablet 3   warfarin (COUMADIN) 2.5 MG  tablet Take 1 tablet (2.5 mg total) by mouth daily. 90 tablet 0   nitroGLYCERIN (NITROSTAT) 0.4 MG SL tablet  (Patient not taking: Reported on 01/07/2021)     prochlorperazine (COMPAZINE) 10 MG tablet Take 1 tablet (10 mg total) by mouth every 6 (six) hours as needed (Nausea or vomiting). (Patient not taking: Reported on 01/07/2021) 30 tablet 1   No current facility-administered medications for this visit.    ALLERGIES:  Allergies  Allergen Reactions   Adhesive [Tape] Rash   Latex Rash   Tetanus Toxoids Rash   Wound Dressing Adhesive Rash    PHYSICAL EXAM:  Performance status (ECOG): 2 - Symptomatic, <50% confined to bed  There were no vitals filed for this visit. Wt Readings from Last 3 Encounters:  12/31/20 156 lb (70.8 kg)  12/25/20 153 lb 1.6 oz (69.4 kg)  12/11/20 154 lb 1.6 oz (69.9 kg)   Physical Exam Vitals reviewed.  Constitutional:      Appearance: Normal appearance.  Cardiovascular:     Rate and Rhythm: Normal rate and regular rhythm.     Pulses: Normal pulses.     Heart sounds: Normal heart sounds.  Pulmonary:     Effort: Pulmonary effort is normal.     Breath sounds: Normal breath sounds.  Musculoskeletal:     Right lower leg: 1+ Edema present.     Left lower leg: 1+ Edema present.  Skin:    Comments: L side posterior hip 2 cm skin nodule  Neurological:     General: No focal deficit present.     Mental Status: She is alert and oriented to person, place, and time.  Psychiatric:        Mood and Affect: Mood normal.        Behavior: Behavior normal.    LABORATORY DATA:  I have reviewed the labs as listed.  CBC Latest Ref Rng & Units 01/07/2021 01/01/2021 12/25/2020  WBC 4.0 - 10.5 K/uL 10.8(H) 11.2(H) 3.5(L)  Hemoglobin 12.0 - 15.0 g/dL 13.1 13.7 13.6  Hematocrit 36.0 - 46.0 % 42.6 42.8 42.9  Platelets 150 - 400 K/uL 299 161 54(L)   CMP Latest Ref Rng & Units 01/07/2021 01/01/2021 12/25/2020  Glucose 70 - 99 mg/dL 138(H) 118(H) 102(H)  BUN 8 - 23 mg/dL  _0 Creatinine 0.44 - 1.00 mg/dL 1.29(H) 1.29(H) 1.21(H)  Sodium 135 - 145 mmol/L 137 138 132(L)  Potassium 3.5 - 5.1 mmol/L 3.9 4.7 4.3  Chloride 98 - 111 mmol/L 99  101 99  CO2 22 - 32 mmol/L _0 Calcium 8.9 - 10.3 mg/dL 8.6(L) 8.5(L) 8.4(L)  Total Protein 6.5 - 8.1 g/dL 6.5 6.3(L) 6.4(L)  Total Bilirubin 0.3 - 1.2 mg/dL 0.6 0.6 0.7  Alkaline Phos 38 - 126 U/L 106 150(H) 84  AST 15 - 41 U/L _1 ALT 0 - 44 U/L _2 DIAGNOSTIC IMAGING:  I have independently reviewed the scans and discussed with the patient. DG Chest 2 View  Result Date: 12/10/2020 CLINICAL DATA:  Pleural effusion. EXAM: CHEST - 2 VIEW COMPARISON:  11/05/2020 FINDINGS: Hyperexpanded lungs with redemonstrated right effusion, which has decreased compared to the prior exam. No other focal pulmonary opacity. No left pleural effusion. Cardiac contours within normal limits. No acute osseous abnormality. IMPRESSION: Interval decrease in the size of the previously noted right pleural effusion, now small. Electronically Signed   By: Merilyn Baba M.D.   On: 12/10/2020 15:47     ASSESSMENT:  1.  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 - She reported COVID infection in December 2020 and has been fatigued since then. - Chest x-ray on 06/21/2020 showed masslike opacity in the right lung. - CT chest with contrast on 06/28/2020 showed right upper lobe lung mass measuring 4 x 3.2 x 3.8 cm.  Small noncalcified mediastinal lymph nodes are all subcentimeter, nonspecific.  Small subpleural nodules in the right upper lobe measure up to 5 mm.  Clustered lingular nodules measuring up to 6 mm nonspecific.  Nonspecific 8 mm enhancing focus in the left lobe of the liver. - Right upper lobectomy and lymph node dissection on 08/15/2020. - Pathology consistent with 4.4 cm large cell neuroendocrine carcinoma, margins negative.  Lymph nodes at  stations 7, 8, 9, 10, 11, 4R, 12 negative for metastatic disease.  No visceral pleural invasion or direct invasion of the adjacent structures.  No lymphovascular invasion.  IHC positive for CD56, synaptophysin and TTF-1.  Chromogranin negative.  Ki-67 elevated. - PET scan on 10/01/2020 with postoperative changes in the chest wall.  Intense hyper metabolism in the acromion with SUV 8.6.  Hypermetabolic focus in the right gluteal region favoring injection site. - Foundation 1 results showed TMB high, K-ras G12C mutation positive.  STK 11 positive - PD-L1 TPS 1%. - Right lateral pelvic soft tissue biopsy on 11/27/2020 consistent with metastatic neuroendocrine carcinoma, Ki-67 40-50%. - Based on K-ras G 12 C and STK 11 positivity, we have recommended treatment like non-small cell lung cancer. - Cycle 1 of carboplatin, paclitaxel and pembrolizumab on 12/17/2020.   2.  Social/family history: - She lives by herself at home and is independent of ADLs and IADLs. - She smoked 2 packs/day for 56 years and quit in May 2012. - She worked in [employer]. - Her nephew had colon cancer.  No other malignancies in the family.   PLAN:  1.  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 - She has tiredness. - Reviewed labs today which showed normal LFTs.  Creatinine is stable at 1.29.  CBC was grossly normal. - TSH was 8.8.  We will closely monitor. - Recommend proceed with cycle 2 today.  We will cut back on paclitaxel to 140 mg per metered square and carboplatin flat dose 260 mg.  RTC 3 weeks for follow-up  with me. - She will be assessed in the symptom management clinic next week for possible fluids and labs.   2.  Shortness of breath on exertion: - Right thoracentesis on 10/07/2020 with 1.7 L clear fluid removed. - She does not report any worsening of her breathing.  3.  Nausea: - Continue Reglan 5 mg 3  times daily as needed.  4.  Multiple subcutaneous nodules: - Left posterior hip skin nodule was treated with radiation. - It measures about 2 cm at this time.  5.  Right shoulder pain: - She has completed radiation therapy last week. - Pain is more dull at this time. - Continue oxycodone 5 mg at bedtime.  6.  Diarrhea: - Continue Lomotil 2 tablets at the first onset of diarrhea followed by 1 tablet after each watery bowel movement.  She had some diarrhea after cycle 1 but not consistent with immunotherapy diarrhea.  7.  Nutrition: - She is drinking 3 cans of Ensure Plus at this time.  She is eating small meals. - Her weight has been stable.  However she is feeling weak.  I have told her to increase Ensure to 4 cans daily.   Orders placed this encounter:  No orders of the defined types were placed in this encounter.    Derek Jack, MD [place] [phone]   I, Thana Ates, am acting as a scribe for Dr. Derek Jack.  I, Derek Jack MD, have reviewed the above documentation for accuracy and completeness, and I agree with the above.

## 2021-01-07 ENCOUNTER — Ambulatory Visit (INDEPENDENT_AMBULATORY_CARE_PROVIDER_SITE_OTHER): Payer: Medicare Other | Admitting: Cardiology

## 2021-01-07 ENCOUNTER — Inpatient Hospital Stay (HOSPITAL_COMMUNITY): Payer: Medicare Other

## 2021-01-07 ENCOUNTER — Other Ambulatory Visit (HOSPITAL_COMMUNITY): Payer: Medicare Other

## 2021-01-07 ENCOUNTER — Other Ambulatory Visit: Payer: Self-pay

## 2021-01-07 ENCOUNTER — Inpatient Hospital Stay (HOSPITAL_BASED_OUTPATIENT_CLINIC_OR_DEPARTMENT_OTHER): Payer: Medicare Other | Admitting: Hematology

## 2021-01-07 VITALS — BP 165/71 | HR 73 | Temp 98.4°F | Resp 18

## 2021-01-07 DIAGNOSIS — Z923 Personal history of irradiation: Secondary | ICD-10-CM | POA: Diagnosis not present

## 2021-01-07 DIAGNOSIS — Z5111 Encounter for antineoplastic chemotherapy: Secondary | ICD-10-CM | POA: Diagnosis not present

## 2021-01-07 DIAGNOSIS — Z87891 Personal history of nicotine dependence: Secondary | ICD-10-CM | POA: Diagnosis not present

## 2021-01-07 DIAGNOSIS — C7A00 Malignant carcinoid tumor of unspecified site: Secondary | ICD-10-CM | POA: Diagnosis not present

## 2021-01-07 DIAGNOSIS — Z5181 Encounter for therapeutic drug level monitoring: Secondary | ICD-10-CM

## 2021-01-07 DIAGNOSIS — C3411 Malignant neoplasm of upper lobe, right bronchus or lung: Secondary | ICD-10-CM

## 2021-01-07 DIAGNOSIS — Z79899 Other long term (current) drug therapy: Secondary | ICD-10-CM | POA: Diagnosis not present

## 2021-01-07 DIAGNOSIS — C7A090 Malignant carcinoid tumor of the bronchus and lung: Secondary | ICD-10-CM | POA: Diagnosis not present

## 2021-01-07 LAB — COMPREHENSIVE METABOLIC PANEL
ALT: 14 U/L (ref 0–44)
AST: 24 U/L (ref 15–41)
Albumin: 3 g/dL — ABNORMAL LOW (ref 3.5–5.0)
Alkaline Phosphatase: 106 U/L (ref 38–126)
Anion gap: 7 (ref 5–15)
BUN: 18 mg/dL (ref 8–23)
CO2: 31 mmol/L (ref 22–32)
Calcium: 8.6 mg/dL — ABNORMAL LOW (ref 8.9–10.3)
Chloride: 99 mmol/L (ref 98–111)
Creatinine, Ser: 1.29 mg/dL — ABNORMAL HIGH (ref 0.44–1.00)
GFR, Estimated: 42 mL/min — ABNORMAL LOW (ref >60)
Glucose, Bld: 138 mg/dL — ABNORMAL HIGH (ref 70–99)
Potassium: 3.9 mmol/L (ref 3.5–5.1)
Sodium: 137 mmol/L (ref 135–145)
Total Bilirubin: 0.6 mg/dL (ref 0.3–1.2)
Total Protein: 6.5 g/dL (ref 6.5–8.1)

## 2021-01-07 LAB — MAGNESIUM: Magnesium: 1.9 mg/dL (ref 1.7–2.4)

## 2021-01-07 LAB — CBC WITH DIFFERENTIAL/PLATELET
Abs Immature Granulocytes: 0.12 10*3/uL — ABNORMAL HIGH (ref 0.00–0.07)
Basophils Absolute: 0.2 10*3/uL — ABNORMAL HIGH (ref 0.0–0.1)
Basophils Relative: 2 %
Eosinophils Absolute: 0.1 10*3/uL (ref 0.0–0.5)
Eosinophils Relative: 1 %
HCT: 42.6 % (ref 36.0–46.0)
Hemoglobin: 13.1 g/dL (ref 12.0–15.0)
Immature Granulocytes: 1 %
Lymphocytes Relative: 5 %
Lymphs Abs: 0.6 10*3/uL — ABNORMAL LOW (ref 0.7–4.0)
MCH: 29.2 pg (ref 26.0–34.0)
MCHC: 30.8 g/dL (ref 30.0–36.0)
MCV: 95.1 fL (ref 80.0–100.0)
Monocytes Absolute: 0.7 10*3/uL (ref 0.1–1.0)
Monocytes Relative: 6 %
Neutro Abs: 9.2 10*3/uL — ABNORMAL HIGH (ref 1.7–7.7)
Neutrophils Relative %: 85 %
Platelets: 299 10*3/uL (ref 150–400)
RBC: 4.48 MIL/uL (ref 3.87–5.11)
RDW: 18.1 % — ABNORMAL HIGH (ref 11.5–15.5)
WBC: 10.8 10*3/uL — ABNORMAL HIGH (ref 4.0–10.5)
nRBC: 0 % (ref 0.0–0.2)

## 2021-01-07 LAB — TSH: TSH: 8.888 u[IU]/mL — ABNORMAL HIGH (ref 0.350–4.500)

## 2021-01-07 MED ORDER — DIPHENHYDRAMINE HCL 50 MG/ML IJ SOLN
25.0000 mg | Freq: Once | INTRAMUSCULAR | Status: AC
  Administered 2021-01-07: 25 mg via INTRAVENOUS
  Filled 2021-01-07: qty 1

## 2021-01-07 MED ORDER — FAMOTIDINE 20 MG IN NS 100 ML IVPB
20.0000 mg | Freq: Once | INTRAVENOUS | Status: AC
  Administered 2021-01-07: 20 mg via INTRAVENOUS
  Filled 2021-01-07: qty 20

## 2021-01-07 MED ORDER — SODIUM CHLORIDE 0.9 % IV SOLN
150.0000 mg | Freq: Once | INTRAVENOUS | Status: AC
  Administered 2021-01-07: 150 mg via INTRAVENOUS
  Filled 2021-01-07: qty 150

## 2021-01-07 MED ORDER — SODIUM CHLORIDE 0.9 % IV SOLN
10.0000 mg | Freq: Once | INTRAVENOUS | Status: AC
  Administered 2021-01-07: 10 mg via INTRAVENOUS
  Filled 2021-01-07: qty 10

## 2021-01-07 MED ORDER — SODIUM CHLORIDE 0.9 % IV SOLN: Freq: Once | INTRAVENOUS | Status: AC

## 2021-01-07 MED ORDER — SODIUM CHLORIDE 0.9 % IV SOLN
200.0000 mg | Freq: Once | INTRAVENOUS | Status: AC
  Administered 2021-01-07: 200 mg via INTRAVENOUS
  Filled 2021-01-07: qty 8

## 2021-01-07 MED ORDER — DIPHENHYDRAMINE HCL 50 MG/ML IJ SOLN: 50.0000 mg | Freq: Once | INTRAMUSCULAR | Status: DC

## 2021-01-07 MED ORDER — SODIUM CHLORIDE 0.9 % IV SOLN
260.0000 mg | Freq: Once | INTRAVENOUS | Status: AC
  Administered 2021-01-07: 260 mg via INTRAVENOUS
  Filled 2021-01-07: qty 26

## 2021-01-07 MED ORDER — OXYCODONE HCL 5 MG PO TABS: 5.0000 mg | ORAL_TABLET | Freq: Every evening | ORAL | 0 refills | Status: DC | PRN

## 2021-01-07 MED ORDER — PALONOSETRON HCL INJECTION 0.25 MG/5ML
0.2500 mg | Freq: Once | INTRAVENOUS | Status: AC
  Administered 2021-01-07: 0.25 mg via INTRAVENOUS
  Filled 2021-01-07: qty 5

## 2021-01-07 MED ORDER — SODIUM CHLORIDE 0.9 % IV SOLN
140.0000 mg/m2 | Freq: Once | INTRAVENOUS | Status: AC
  Administered 2021-01-07: 258 mg via INTRAVENOUS
  Filled 2021-01-07: qty 43

## 2021-01-07 NOTE — Progress Notes (Signed)
Decrease diphenhydramine dose to 25 mg IV push prior to chemotherapy.  Due to shakiness with cycle 1 and patient's age.  Plan updated.  T.O. Dr Rhys Martini, PharmD

## 2021-01-07 NOTE — Patient Instructions (Signed)
Katelyn [name]  Discharge Instructions: Thank you for choosing [facility] to provide your oncology and hematology care.  If you have a lab appointment with the [facility], please come in thru the Main Entrance and check in at the main information desk.  Wear comfortable clothing and clothing appropriate for easy access to any Portacath or PICC line.   We strive to give you quality time with your provider. You may need to reschedule your appointment if you arrive late (15 or more minutes).  Arriving late affects you and other patients whose appointments are after yours.  Also, if you miss three or more appointments without notifying the office, you may be dismissed from the clinic at the provider's discretion.      For prescription refill requests, have your pharmacy contact our office and allow 72 hours for refills to be completed.    Today you received the following chemotherapy and/or immunotherapy agents Keytruda, Taxol, Carboplatin. Return as scheduled.   To help prevent nausea and vomiting after your treatment, we encourage you to take your nausea medication as directed.  BELOW ARE SYMPTOMS THAT SHOULD BE REPORTED IMMEDIATELY: *FEVER GREATER THAN 100.4 F (38 C) OR HIGHER *CHILLS OR SWEATING *NAUSEA AND VOMITING THAT IS NOT CONTROLLED WITH YOUR NAUSEA MEDICATION *UNUSUAL SHORTNESS OF BREATH *UNUSUAL BRUISING OR BLEEDING *URINARY PROBLEMS (pain or burning when urinating, or frequent urination) *BOWEL PROBLEMS (unusual diarrhea, constipation, pain near the anus) TENDERNESS IN MOUTH AND THROAT WITH OR WITHOUT PRESENCE OF ULCERS (sore throat, sores in mouth, or a toothache) UNUSUAL RASH, SWELLING OR PAIN  UNUSUAL VAGINAL DISCHARGE OR ITCHING   Items with * indicate a potential emergency and should be followed up as soon as possible or go to the Emergency Department if any problems should occur.  Please show the CHEMOTHERAPY ALERT CARD or IMMUNOTHERAPY ALERT  CARD at check-in to the Emergency Department and triage nurse.  Should you have questions after your visit or need to cancel or reschedule your appointment, please contact [facility] [phone]  and follow the prompts.  Office hours are 8:00 a.m. to 4:30 p.m. Monday - Friday. Please note that voicemails left after 4:00 p.m. may not be returned until the following business day.  We are closed weekends and major holidays. You have access to a nurse at all times for urgent questions. Please call the main number to the clinic [phone] and follow the prompts.  For any non-urgent questions, you may also contact your provider using MyChart. We now offer e-Visits for anyone 38 and older to request care online for non-urgent symptoms. For details visit [email or web address].   Also download the MyChart app! Go to the app store, search "MyChart", open the app, select , and log in with your MyChart username and password.  Due to Covid, a mask is required upon entering the hospital/clinic. If you do not have a mask, one will be given to you upon arrival. For doctor visits, patients may have 1 support person aged 78 or older with them. For treatment visits, patients cannot have anyone with them due to current Covid guidelines and our immunocompromised population.

## 2021-01-07 NOTE — Patient Instructions (Signed)
[place] at [facility] Discharge Instructions  You were seen and examined today by Dr. Delton Coombes.  We will proceed with your second cycle of treatment today.  Try to increase your caloric intake to 1800 calories/day.  Increase Ensure from 3 cans a day to 4.  Try to increase your activity around the house - use your walker to get around.  Take periods of rest between activities if you feel too tired.  Return as scheduled for lab work, office visit, and treatment.    Thank you for choosing [place] at [facility] to provide your oncology and hematology care.  To afford each patient quality time with our provider, please arrive at least 15 minutes before your scheduled appointment time.   If you have a lab appointment with the [facility] please come in thru the Main Entrance and check in at the main information desk.  You need to re-schedule your appointment should you arrive 10 or more minutes late.  We strive to give you quality time with our providers, and arriving late affects you and other patients whose appointments are after yours.  Also, if you no show three or more times for appointments you may be dismissed from the clinic at the providers discretion.     Again, thank you for choosing [facility].  Our hope is that these requests will decrease the amount of time that you wait before being seen by our physicians.       _____________________________________________________________  Should you have questions after your visit to [facility], please contact our office at [phone] and follow the prompts.  Our office hours are 8:00 a.m. and 4:30 p.m. Monday - Friday.  Please note that voicemails left after 4:00 p.m. may not be returned until the following business day.  We are closed weekends and major holidays.  You do have access to a nurse 24-7, just call the main number to the clinic [phone] and do  not press any options, hold on the line and a nurse will answer the phone.    For prescription refill requests, have your pharmacy contact our office and allow 72 hours.    Due to Covid, you will need to wear a mask upon entering the hospital. If you do not have a mask, a mask will be given to you at the Main Entrance upon arrival. For doctor visits, patients may have 1 support person age 83 or older with them. For treatment visits, patients can not have anyone with them due to social distancing guidelines and our immunocompromised population.

## 2021-01-07 NOTE — Progress Notes (Signed)
Patient presents today for Keytruda/Taxivol/Carboplatin. Patient okay for treatment per Dr. Delton Coombes, IV started. Pre-medications given before treatment.  Patient tolerated chemotherapy with no complaints voiced. Side effects with management reviewed understanding verbalized. IV site clean and dry with no bruising or swelling noted at site. Good blood return noted before and after administration of chemotherapy. Band aid applied. Patient left in satisfactory condition with VSS and no s/s of distress noted.

## 2021-01-07 NOTE — Progress Notes (Signed)
Patient has been examined, vital signs and labs have been reviewed by Dr. Delton Coombes. ANC, Creatinine, LFTs, hemoglobin, and platelets are within treatment parameters per Dr. Delton Coombes. Patient may proceed with treatment per M.D.

## 2021-01-07 NOTE — Patient Instructions (Addendum)
Description   Spoke with [name], patient's daughter and instructed for pt to continue to taking warfarin 1 tablet daily. Recheck INR in 1 week.

## 2021-01-08 LAB — T4: T4, Total: 13 ug/dL — ABNORMAL HIGH (ref 4.5–12.0)

## 2021-01-09 ENCOUNTER — Encounter (HOSPITAL_COMMUNITY): Payer: Self-pay

## 2021-01-09 ENCOUNTER — Inpatient Hospital Stay (HOSPITAL_COMMUNITY): Payer: Medicare Other

## 2021-01-09 ENCOUNTER — Other Ambulatory Visit: Payer: Self-pay

## 2021-01-09 VITALS — BP 130/69 | HR 60 | Temp 97.7°F | Resp 18

## 2021-01-09 DIAGNOSIS — C3411 Malignant neoplasm of upper lobe, right bronchus or lung: Secondary | ICD-10-CM

## 2021-01-09 DIAGNOSIS — C7A090 Malignant carcinoid tumor of the bronchus and lung: Secondary | ICD-10-CM | POA: Diagnosis not present

## 2021-01-09 DIAGNOSIS — C7A00 Malignant carcinoid tumor of unspecified site: Secondary | ICD-10-CM | POA: Diagnosis not present

## 2021-01-09 DIAGNOSIS — Z5111 Encounter for antineoplastic chemotherapy: Secondary | ICD-10-CM | POA: Diagnosis not present

## 2021-01-09 DIAGNOSIS — Z79899 Other long term (current) drug therapy: Secondary | ICD-10-CM | POA: Diagnosis not present

## 2021-01-09 DIAGNOSIS — Z87891 Personal history of nicotine dependence: Secondary | ICD-10-CM | POA: Diagnosis not present

## 2021-01-09 DIAGNOSIS — Z923 Personal history of irradiation: Secondary | ICD-10-CM | POA: Diagnosis not present

## 2021-01-09 MED ORDER — PEGFILGRASTIM-CBQV 6 MG/0.6ML ~~LOC~~ SOSY
6.0000 mg | PREFILLED_SYRINGE | Freq: Once | SUBCUTANEOUS | Status: AC
  Administered 2021-01-09: 6 mg via SUBCUTANEOUS
  Filled 2021-01-09: qty 0.6

## 2021-01-09 NOTE — Progress Notes (Signed)
Patient tolerated injection with no complaints voiced. Site clean and dry with no bruising or swelling noted at site. See MAR for details. Band aid applied.  Patient stable during and after injection. VSS with discharge and left in satisfactory condition with no s/s of distress noted.  

## 2021-01-09 NOTE — Patient Instructions (Signed)
[place]  Discharge Instructions: Thank you for choosing [facility] to provide your oncology and hematology care.  If you have a lab appointment with the [facility], please come in thru the Main Entrance and check in at the main information desk.  Wear comfortable clothing and clothing appropriate for easy access to any Portacath or PICC line.   We strive to give you quality time with your provider. You may need to reschedule your appointment if you arrive late (15 or more minutes).  Arriving late affects you and other patients whose appointments are after yours.  Also, if you miss three or more appointments without notifying the office, you may be dismissed from the clinic at the provider's discretion.      For prescription refill requests, have your pharmacy contact our office and allow 72 hours for refills to be completed.    Today you received the following; [place], return as scheduled.   To help prevent nausea and vomiting after your treatment, we encourage you to take your nausea medication as directed.  BELOW ARE SYMPTOMS THAT SHOULD BE REPORTED IMMEDIATELY: *FEVER GREATER THAN 100.4 F (38 C) OR HIGHER *CHILLS OR SWEATING *NAUSEA AND VOMITING THAT IS NOT CONTROLLED WITH YOUR NAUSEA MEDICATION *UNUSUAL SHORTNESS OF BREATH *UNUSUAL BRUISING OR BLEEDING *URINARY PROBLEMS (pain or burning when urinating, or frequent urination) *BOWEL PROBLEMS (unusual diarrhea, constipation, pain near the anus) TENDERNESS IN MOUTH AND THROAT WITH OR WITHOUT PRESENCE OF ULCERS (sore throat, sores in mouth, or a toothache) UNUSUAL RASH, SWELLING OR PAIN  UNUSUAL VAGINAL DISCHARGE OR ITCHING   Items with * indicate a potential emergency and should be followed up as soon as possible or go to the Emergency Department if any problems should occur.  Please show the CHEMOTHERAPY ALERT CARD or IMMUNOTHERAPY ALERT CARD at check-in to the Emergency Department and triage  nurse.  Should you have questions after your visit or need to cancel or reschedule your appointment, please contact [facility] [phone]  and follow the prompts.  Office hours are 8:00 a.m. to 4:30 p.m. Monday - Friday. Please note that voicemails left after 4:00 p.m. may not be returned until the following business day.  We are closed weekends and major holidays. You have access to a nurse at all times for urgent questions. Please call the main number to the clinic [phone] and follow the prompts.  For any non-urgent questions, you may also contact your provider using MyChart. We now offer e-Visits for anyone 73 and older to request care online for non-urgent symptoms. For details visit [email or web address].   Also download the MyChart app! Go to the app store, search "MyChart", open the app, select [place], and log in with your MyChart username and password.  Due to Covid, a mask is required upon entering the hospital/clinic. If you do not have a mask, one will be given to you upon arrival. For doctor visits, patients may have 1 support person aged 34 or older with them. For treatment visits, patients cannot have anyone with them due to current Covid guidelines and our immunocompromised population.

## 2021-01-10 ENCOUNTER — Other Ambulatory Visit (HOSPITAL_COMMUNITY): Payer: Medicare Other

## 2021-01-10 NOTE — Patient Instructions (Signed)
Katelyn [name]  01/10/2021     @PREFPERIOPPHARMACY @   Your procedure is scheduled on  01/15/2021.   Report to [facility] at 1030 A.M.   Call this number if you have problems the morning of surgery:  [phone]   Remember:  Do not eat or drink after midnight.      Take these medicines the morning of surgery with A SIP OF WATER      pacerone, mobic(if needed), metoprolol, oxycodone (if needed), protonix, compazine (if needed).     Do not wear jewelry, make-up or nail polish.  Do not wear lotions, powders, or perfumes, or deodorant.  Do not shave 48 hours prior to surgery.  Men may shave face and neck.  Do not bring valuables to the hospital.  [facility] is not responsible for any belongings or valuables.  Contacts, dentures or bridgework may not be worn into surgery.  Leave your suitcase in the car.  After surgery it may be brought to your room.  For patients admitted to the hospital, discharge time will be determined by your treatment team.  Patients discharged the day of surgery will not be allowed to drive home and must have someone with them for 24 hours.    Special instructions:   DO NOT smoke tobacco or vape for 24 hours before your procedure.  Please read over the following fact sheets that you were given. Coughing and Deep Breathing, Surgical Site Infection Prevention, Anesthesia Post-op Instructions, and Care and Recovery After Surgery      Implanted [facility] Guide An implanted port is a device that is placed under the skin. It is usually placed in the chest. The device may vary based on the need. Implanted ports can be used to give IV medicine, to take blood, or to give fluids. You may have an implanted port if: You need IV medicine that would be irritating to the small veins in your hands or arms. You need IV medicines, such as chemotherapy, for a long period of time. You need IV nutrition for a long period of time. You may have fewer  limitations when using a port than you would if you used other types of long-term IVs. You will also likely be able to return to normal activities after your incision heals. An implanted port has two main parts: Reservoir. The reservoir is the part where a needle is inserted to give medicines or draw blood. The reservoir is round. After the port is placed, it appears as a small, raised area under your skin. Catheter. The catheter is a small, thin tube that connects the reservoir to a vein. Medicine that is inserted into the reservoir goes into the catheter and then into the vein. How is my port accessed? To access your port: A numbing cream may be placed on the skin over the port site. Your health care provider will put on a mask and sterile gloves. The skin over your port will be cleaned carefully with a germ-killing soap and allowed to dry. Your health care provider will gently pinch the port and insert a needle into it. Your health care provider will check for a blood return to make sure the port is in the vein and is still working (patent). If your port needs to remain accessed to get medicine continuously (constant infusion), your health care provider will place a clear bandage (dressing) over the needle site. The dressing and needle will  need to be changed every week, or as told by your health care provider. What is flushing? Flushing helps keep the port working. Follow instructions from your health care provider about how and when to flush the port. Ports are usually flushed with saline solution or a medicine called heparin. The need for flushing will depend on how the port is used: If the port is only used from time to time to give medicines or draw blood, the port may need to be flushed: Before and after medicines have been given. Before and after blood has been drawn. As part of routine maintenance. Flushing may be recommended every 4-6 weeks. If a constant infusion is running, the port  may not need to be flushed. Throw away any syringes in a disposal container that is meant for sharp items (sharps container). You can buy a sharps container from a pharmacy, or you can make one by using an empty hard plastic bottle with a cover. How long will my port stay implanted? The port can stay in for as long as your health care provider thinks it is needed. When it is time for the port to come out, a surgery will be done to remove it. The surgery will be similar to the procedure that was done to put the port in. Follow these instructions at home: Caring for your port and port site Flush your port as told by your health care provider. If you need an infusion over several days, follow instructions from your health care provider about how to take care of your port site. Make sure you: Change your dressing as told by your health care provider. Wash your hands with soap and water for at least 20 seconds before and after you change your dressing. If soap and water are not available, use alcohol-based hand sanitizer. Place any used dressings or infusion bags into a plastic bag. Throw that bag in the trash. Keep the dressing that covers the needle clean and dry. Do not get it wet. Do not use scissors or sharp objects near the infusion tubing. Keep any external tubes clamped, unless they are being used. Check your port site every day for signs of infection. Check for: Redness, swelling, or pain. Fluid or blood. Warmth. Pus or a bad smell. Protect the skin around the port site. Avoid wearing bra straps that rub or irritate the site. Protect the skin around your port from seat belts. Place a soft pad over your chest if needed. Bathe or shower as told by your health care provider. The site may get wet as long as you are not actively receiving an infusion. General instructions  Return to your normal activities as told by your health care provider. Ask your health care provider what activities are  safe for you. Carry a medical alert card or wear a medical alert bracelet at all times. This will let health care providers know that you have an implanted port in case of an emergency. Where to find more information American Cancer Society: www.cancer.[place] of Clinical Oncology: www.cancer.net Contact a health care provider if: You have a fever or chills. You have redness, swelling, or pain at the port site. You have fluid or blood coming from your port site. Your incision feels warm to the touch. You have pus or a bad smell coming from the port site. Summary Implanted ports are usually placed in the chest for long-term IV access. Follow instructions from your health care provider about flushing the port  and changing bandages (dressings). Take care of the area around your port by avoiding clothing that puts pressure on the area, and by watching for signs of infection. Protect the skin around your port from seat belts. Place a soft pad over your chest if needed. Contact a health care provider if you have a fever or you have redness, swelling, pain, fluid, or a bad smell at the port site. This information is not intended to replace advice given to you by your health care provider. Make sure you discuss any questions you have with your health care provider. Document Revised: 08/20/2020 Document Reviewed: 08/20/2020 Elsevier Patient Education  2022 [place] Insertion, Care After The following information offers guidance on how to care for yourself after your procedure. Your health care provider may also give you more specific instructions. If you have problems or questions, contact your health care provider. What can I expect after the procedure? After the procedure, it is common to have: Discomfort at the port insertion site. Bruising on the skin over the port. This should improve over 3-4 days. Follow these instructions at home: [facility] care After your port  is placed, you will get a manufacturer's information card. The card has information about your port. Keep this card with you at all times. Take care of the port as told by your health care provider. Ask your health care provider if you or a family member can get training for taking care of the port at home. A home health care nurse will be be available to help care for the port. Make sure to remember what type of port you have. Incision care   Follow instructions from your health care provider about how to take care of your port insertion site. Make sure you: Wash your hands with soap and water for at least 20 seconds before and after you change your bandage (dressing). If soap and water are not available, use hand sanitizer. Change your dressing as told by your health care provider. Leave stitches (sutures), skin glue, or adhesive strips in place. These skin closures may need to stay in place for 2 weeks or longer. If adhesive strip edges start to loosen and curl up, you may trim the loose edges. Do not remove adhesive strips completely unless your health care provider tells you to do that. Check your port insertion site every day for signs of infection. Check for: Redness, swelling, or pain. Fluid or blood. Warmth. Pus or a bad smell. Activity Return to your normal activities as told by your health care provider. Ask your health care provider what activities are safe for you. You may have to avoid lifting. Ask your health care provider how much you can safely lift. General instructions Take over-the-counter and prescription medicines only as told by your health care provider. Do not take baths, swim, or use a hot tub until your health care provider approves. Ask your health care provider if you may take showers. You may only be allowed to take sponge baths. If you were given a sedative during the procedure, it can affect you for several hours. Do not drive or operate machinery until your  health care provider says that it is safe. Wear a medical alert bracelet in case of an emergency. This will tell any health care providers that you have a port. Keep all follow-up visits. This is important. Contact a health care provider if: You cannot flush your port with saline as directed, or you cannot draw  blood from the port. You have a fever or chills. You have redness, swelling, or pain around your port insertion site. You have fluid or blood coming from your port insertion site. Your port insertion site feels warm to the touch. You have pus or a bad smell coming from the port insertion site. Get help right away if: You have chest pain or shortness of breath. You have bleeding from your port that you cannot control. These symptoms may be an emergency. Get help right away. Call 911. Do not wait to see if the symptoms will go away. Do not drive yourself to the hospital. Summary Take care of the port as told by your health care provider. Keep the manufacturer's information card with you at all times. Change your dressing as told by your health care provider. Contact a health care provider if you have a fever or chills or if you have redness, swelling, or pain around your port insertion site. Keep all follow-up visits. This information is not intended to replace advice given to you by your health care provider. Make sure you discuss any questions you have with your health care provider. Document Revised: 08/20/2020 Document Reviewed: 08/20/2020 Elsevier Patient Education  [place] After This sheet gives you information about how to care for yourself after your procedure. Your health care provider may also give you more specific instructions. If you have problems or questions, contact your health care provider. What can I expect after the procedure? After the procedure, it is common to have: Tiredness. Forgetfulness about what happened after  the procedure. Impaired judgment for important decisions. Nausea or vomiting. Some difficulty with balance. Follow these instructions at home: For the time period you were told by your health care provider:   Rest as needed. Do not participate in activities where you could fall or become injured. Do not drive or use machinery. Do not drink alcohol. Do not take sleeping pills or medicines that cause drowsiness. Do not make important decisions or sign legal documents. Do not take care of children on your own. Eating and drinking Follow the diet that is recommended by your health care provider. Drink enough fluid to keep your urine pale yellow. If you vomit: Drink water, juice, or soup when you can drink without vomiting. Make sure you have little or no nausea before eating solid foods. General instructions Have a responsible adult stay with you for the time you are told. It is important to have someone help care for you until you are awake and alert. Take over-the-counter and prescription medicines only as told by your health care provider. If you have sleep apnea, surgery and certain medicines can increase your risk for breathing problems. Follow instructions from your health care provider about wearing your sleep device: Anytime you are sleeping, including during daytime naps. While taking prescription pain medicines, sleeping medicines, or medicines that make you drowsy. Avoid smoking. Keep all follow-up visits as told by your health care provider. This is important. Contact a health care provider if: You keep feeling nauseous or you keep vomiting. You feel light-headed. You are still sleepy or having trouble with balance after 24 hours. You develop a rash. You have a fever. You have redness or swelling around the IV site. Get help right away if: You have trouble breathing. You have new-onset confusion at home. Summary For several hours after your procedure, you may feel  tired. You may also be forgetful and have poor judgment.  Have a responsible adult stay with you for the time you are told. It is important to have someone help care for you until you are awake and alert. Rest as told. Do not drive or operate machinery. Do not drink alcohol or take sleeping pills. Get help right away if you have trouble breathing, or if you suddenly become confused. This information is not intended to replace advice given to you by your health care provider. Make sure you discuss any questions you have with your health care provider. Document Revised: 11/02/2019 Document Reviewed: 01/19/2019 Elsevier Patient Education  2022 [place]. How to Use Chlorhexidine for Bathing Chlorhexidine gluconate (CHG) is a germ-killing (antiseptic) solution that is used to clean the skin. It can get rid of the bacteria that normally live on the skin and can keep them away for about 24 hours. To clean your skin with CHG, you may be given: A CHG solution to use in the shower or as part of a sponge bath. A prepackaged cloth that contains CHG. Cleaning your skin with CHG may help lower the risk for infection: While you are staying in the intensive care unit of the hospital. If you have a vascular access, such as a central line, to provide short-term or long-term access to your veins. If you have a catheter to drain urine from your bladder. If you are on a ventilator. A ventilator is a machine that helps you breathe by moving air in and out of your lungs. After surgery. What are the risks? Risks of using CHG include: A skin reaction. Hearing loss, if CHG gets in your ears and you have a perforated eardrum. Eye injury, if CHG gets in your eyes and is not rinsed out. The CHG product catching fire. Make sure that you avoid smoking and flames after applying CHG to your skin. Do not use CHG: If you have a chlorhexidine allergy or have previously reacted to chlorhexidine. On babies younger than 75  months of age. How to use CHG solution Use CHG only as told by your health care provider, and follow the instructions on the label. Use the full amount of CHG as directed. Usually, this is one bottle. During a shower Follow these steps when using CHG solution during a shower (unless your health care provider gives you different instructions): Start the shower. Use your normal soap and shampoo to wash your face and hair. Turn off the shower or move out of the shower stream. Pour the CHG onto a clean washcloth. Do not use any type of brush or rough-edged sponge. Starting at your neck, lather your body down to your toes. Make sure you follow these instructions: If you will be having surgery, pay special attention to the part of your body where you will be having surgery. Scrub this area for at least 1 minute. Do not use CHG on your head or face. If the solution gets into your ears or eyes, rinse them well with water. Avoid your genital area. Avoid any areas of skin that have broken skin, cuts, or scrapes. Scrub your back and under your arms. Make sure to wash skin folds. Let the lather sit on your skin for 1-2 minutes or as long as told by your health care provider. Thoroughly rinse your entire body in the shower. Make sure that all body creases and crevices are rinsed well. Dry off with a clean towel. Do not put any substances on your body afterward--such as powder, lotion, or perfume--unless you are  told to do so by your health care provider. Only use lotions that are recommended by the manufacturer. Put on clean clothes or pajamas. If it is the night before your surgery, sleep in clean sheets.  During a sponge bath Follow these steps when using CHG solution during a sponge bath (unless your health care provider gives you different instructions): Use your normal soap and shampoo to wash your face and hair. Pour the CHG onto a clean washcloth. Starting at your neck, lather your body down to  your toes. Make sure you follow these instructions: If you will be having surgery, pay special attention to the part of your body where you will be having surgery. Scrub this area for at least 1 minute. Do not use CHG on your head or face. If the solution gets into your ears or eyes, rinse them well with water. Avoid your genital area. Avoid any areas of skin that have broken skin, cuts, or scrapes. Scrub your back and under your arms. Make sure to wash skin folds. Let the lather sit on your skin for 1-2 minutes or as long as told by your health care provider. Using a different clean, wet washcloth, thoroughly rinse your entire body. Make sure that all body creases and crevices are rinsed well. Dry off with a clean towel. Do not put any substances on your body afterward--such as powder, lotion, or perfume--unless you are told to do so by your health care provider. Only use lotions that are recommended by the manufacturer. Put on clean clothes or pajamas. If it is the night before your surgery, sleep in clean sheets. How to use CHG prepackaged cloths Only use CHG cloths as told by your health care provider, and follow the instructions on the label. Use the CHG cloth on clean, dry skin. Do not use the CHG cloth on your head or face unless your health care provider tells you to. When washing with the CHG cloth: Avoid your genital area. Avoid any areas of skin that have broken skin, cuts, or scrapes. Before surgery Follow these steps when using a CHG cloth to clean before surgery (unless your health care provider gives you different instructions): Using the CHG cloth, vigorously scrub the part of your body where you will be having surgery. Scrub using a back-and-forth motion for 3 minutes. The area on your body should be completely wet with CHG when you are done scrubbing. Do not rinse. Discard the cloth and let the area air-dry. Do not put any substances on the area afterward, such as powder,  lotion, or perfume. Put on clean clothes or pajamas. If it is the night before your surgery, sleep in clean sheets.  For general bathing Follow these steps when using CHG cloths for general bathing (unless your health care provider gives you different instructions). Use a separate CHG cloth for each area of your body. Make sure you wash between any folds of skin and between your fingers and toes. Wash your body in the following order, switching to a new cloth after each step: The front of your neck, shoulders, and chest. Both of your arms, under your arms, and your hands. Your stomach and groin area, avoiding the genitals. Your right leg and foot. Your left leg and foot. The back of your neck, your back, and your buttocks. Do not rinse. Discard the cloth and let the area air-dry. Do not put any substances on your body afterward--such as powder, lotion, or perfume--unless you are told to  do so by your health care provider. Only use lotions that are recommended by the manufacturer. Put on clean clothes or pajamas. Contact a health care provider if: Your skin gets irritated after scrubbing. You have questions about using your solution or cloth. You swallow any chlorhexidine. Call your local poison control center (1-(912)228-5238 in the [place] Get help right away if: Your eyes itch badly, or they become very red or swollen. Your skin itches badly and is red or swollen. Your hearing changes. You have trouble seeing. You have swelling or tingling in your mouth or throat. You have trouble breathing. These symptoms may represent a serious problem that is an emergency. Do not wait to see if the symptoms will go away. Get medical help right away. Call your local emergency services (911 in the [place] Do not drive yourself to the hospital. Summary Chlorhexidine gluconate (CHG) is a germ-killing (antiseptic) solution that is used to clean the skin. Cleaning your skin with CHG may help to lower your  risk for infection. You may be given CHG to use for bathing. It may be in a bottle or in a prepackaged cloth to use on your skin. Carefully follow your health care provider's instructions and the instructions on the product label. Do not use CHG if you have a chlorhexidine allergy. Contact your health care provider if your skin gets irritated after scrubbing. This information is not intended to replace advice given to you by your health care provider. Make sure you discuss any questions you have with your health care provider. Document Revised: 04/29/2020 Document Reviewed: 04/29/2020 Elsevier Patient Education  2022 [employer].

## 2021-01-13 ENCOUNTER — Telehealth (HOSPITAL_COMMUNITY): Payer: Self-pay | Admitting: Dietician

## 2021-01-13 ENCOUNTER — Encounter (HOSPITAL_COMMUNITY): Payer: Medicare Other | Admitting: Dietician

## 2021-01-13 NOTE — Telephone Encounter (Signed)
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 She completed palliative radiation therapy for right shoulder pain 11/1. Patient receiving carboplatin, paclitaxel, Beryle Flock.   Spoke with patient via telephone. She reports shoulder pain is better s/p radiation therapy. Her appetite is improving and has started eating meats again. Patient had a ham biscuit over the weekend, ate a cheeseburger yesterday. She has had a banana, glass of milk, and one Ensure today. Patient reports drinking 3 Ensure Plus/day, says she is supposed to drink 4 but this is hard for her. Patient reports tolerating chemoimmunotherapy well. She denies altered taste, nausea, vomiting, diarrhea. Patient reports constipation. She is taking stool softener as needed, reports having bowel movement yesterday.   Medications: reviewed  Labs: 11/8 glucose 138, Cr 1.29  Anthropometrics: Last weight 156 lb on 11/1 increased from 154 lb 1.6 oz on 10/12.   9/14 - 161 lb  6/1 - 179 lb  NUTRITION DIAGNOSIS: Unintentional weight loss stable    INTERVENTION:  Encouraged small frequent meals and snacks with adequate calories and protein Continue drinking 3-4 Ensure Plus/equivalent daily to promote weight gain - will provide coupons Discussed cutting Ensure with milk to improve taste or mixing with ice cream for high calorie, high protein shake - will provide shake recipes Suggesting trying CIB mixed with whole milk as alternate ONS - will leave sample Patient has contact information  Bag with recipes and CIB sample left at registration desk for patient to pick up on 11/15 (pt aware)    MONITORING, EVALUATION, GOAL: weight trends, intake    NEXT VISIT: via telephone ~6 weeks

## 2021-01-13 NOTE — Progress Notes (Signed)
[place] [facility] [address], [place] [address] Phone: [phone] Fax: [place] PROGRESS NOTE   [name] [id number] [birth year] 80 y.o.  Katelyn [name] is managed by Dr. 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   Actively treated with chemotherapy/immunotherapy/hormonal therapy: yes  Current therapy: Carboplatin, paclitaxel, pembrolizumab  Last treated: 01/07/2021  Next scheduled appointment with provider: 01/28/2021  Subjective:  Chief Complaint: Weakness, diarrhea, symptom management following chemotherapy  Katelyn [name] is managed by Dr. 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  She had right upper lung lobectomy on 08/15/2020.  She received her first cycle of carboplatin, paclitaxel, and pembrolizumab on 12/17/2020.  She received her second cycle of the same on 01/07/2021.  At today's visit, she reports that her second cycle of chemotherapy was tolerated reasonably well.  She does not have any major adverse symptoms during administration of chemotherapy, but does continue to complain of significant weakness and fatigue after chemotherapy.  She has not had any significant nausea or diarrhea, and has not needed to take her Reglan or Lomotil since her most recent chemotherapy cycle  She notes resolution of mucositis, gum bleeding, and excessive drooling.  Her right shoulder pain has improved following completion of radiation therapy.  She reports that she has a 4/10 aching pain with movement, but denies any shoulder pain at rest.  She has not needed her oxycodone except for occasionally at night.  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  She does get short winded with exertion, but denies any shortness  of breath at rest.  She reports stable mild cough which has been persistent ever since her lobectomy surgery.  No changes in her breathing status.  She is drinking 30 to 40 ounces of water per day.  She reports poor appetite, but has been able to force herself to drink 3 Ensure beverages daily.  Her weight today was 151 pounds, down slightly from her usual range of 153-156 for the past month.  In total, she has lost about 30 pounds in the past year (weighed 183 pounds in March 2022).  Her peripheral edema has resolved, and she continues to take Lasix daily.  She reports worsening hair loss and alopecia.  She has little to no energy today.  Reports 25% appetite.   Review of Systems:  Review of Systems  Constitutional:  Positive for activity change, appetite change, fatigue and unexpected weight change. Negative for chills, diaphoresis and fever.  HENT:  Negative for drooling, mouth sores and sore throat.   Respiratory:  Positive for cough (mild, persistent since lobectomy) and shortness of breath (with exertion). Negative for chest tightness and wheezing.   Cardiovascular:  Negative for chest pain, palpitations and leg swelling.  Gastrointestinal:  Negative for abdominal pain, constipation, diarrhea, nausea and vomiting.  Musculoskeletal:  Positive for arthralgias.  Neurological:  Positive for numbness.    Past Medical History, Surgical history, Social history, and Family history were reviewed as documented elsewhere in chart, and were updated as appropriate.   Objective:   Physical Exam:  There were no vitals taken for this visit. ECOG: 2  Physical Exam Constitutional:      Appearance: Normal appearance.     Comments: Thin body habitus  HENT:     Head:  Normocephalic and atraumatic.     Mouth/Throat:     Mouth: Mucous membranes are moist.  Eyes:     Extraocular Movements: Extraocular movements intact.     Pupils: Pupils are equal, round, and reactive to light.  Cardiovascular:      Rate and Rhythm: Normal rate and regular rhythm.     Pulses: Normal pulses.     Heart sounds: Murmur heard.  Pulmonary:     Effort: Pulmonary effort is normal.     Breath sounds: Examination of the right-middle field reveals decreased breath sounds. Examination of the right-lower field reveals decreased breath sounds. Decreased breath sounds present.  Abdominal:     General: Bowel sounds are normal.     Palpations: Abdomen is soft.     Tenderness: There is no abdominal tenderness.  Musculoskeletal:        General: No swelling.     Right lower leg: No edema.     Left lower leg: No edema.  Lymphadenopathy:     Cervical: No cervical adenopathy.  Skin:    General: Skin is warm and dry.  Neurological:     General: No focal deficit present.     Mental Status: She is alert and oriented to person, place, and time.  Psychiatric:        Mood and Affect: Mood normal.        Behavior: Behavior normal.    Lab Review:     Component Value Date/Time   NA 137 01/07/2021 0859   NA 137 03/20/2020 1039   K 3.9 01/07/2021 0859   CL 99 01/07/2021 0859   CO2 31 01/07/2021 0859   GLUCOSE 138 (H) 01/07/2021 0859   BUN 18 01/07/2021 0859   BUN 31 (H) 03/20/2020 1039   CREATININE 1.29 (H) 01/07/2021 0859   CREATININE 1.31 (H) 06/26/2020 1040   CALCIUM 8.6 (L) 01/07/2021 0859   PROT 6.5 01/07/2021 0859   PROT 6.5 11/21/2018 1146   ALBUMIN 3.0 (L) 01/07/2021 0859   ALBUMIN 4.1 11/21/2018 1146   AST 24 01/07/2021 0859   ALT 14 01/07/2021 0859   ALKPHOS 106 01/07/2021 0859   BILITOT 0.6 01/07/2021 0859   BILITOT 0.6 11/21/2018 1146   GFRNONAA 42 (L) 01/07/2021 0859   GFRNONAA 39 (L) 06/26/2020 1040   GFRAA 45 (L) 06/26/2020 1040       Component Value Date/Time   WBC 10.8 (H) 01/07/2021 0859   RBC 4.48 01/07/2021 0859   HGB 13.1 01/07/2021 0859   HGB 15.4 05/17/2018 0913   HCT 42.6 01/07/2021 0859   HCT 47.0 (H) 05/17/2018 0913   PLT 299 01/07/2021 0859   PLT 163 05/17/2018 0913    MCV 95.1 01/07/2021 0859   MCV 92 05/17/2018 0913   MCH 29.2 01/07/2021 0859   MCHC 30.8 01/07/2021 0859   RDW 18.1 (H) 01/07/2021 0859   RDW 13.2 05/17/2018 0913   LYMPHSABS 0.6 (L) 01/07/2021 0859   MONOABS 0.7 01/07/2021 0859   EOSABS 0.1 01/07/2021 0859   BASOSABS 0.2 (H) 01/07/2021 0859   -------------------------------  Imaging from last 24 hours (if applicable):  Radiology interpretation: No results found.    Assessment & Plan:    1.  Metastatic large cell neuroendocrine carcinoma of the right upper lobe: - Managed by Dr. Delton Coombes - She received first cycle of carboplatin, paclitaxel and pembrolizumab on 12/17/2020.  Cycle #2 on 01/07/2021. - Labs reviewed today (01/14/2021) show baseline kidney function (CKD stage III with creatinine 1.09 with GFR  52)  - CBC (01/14/2021) shows mild leukopenia with WBC 3.9 with normal differential; Hgb 12.7; platelets 75 - PLAN: Scheduled for next visit with Dr. Delton Coombes and cycle #3 of chemotherapy next week on 01/28/2021.   2.  Dehydration & electrolyte imbalances - She is drinking 30 to 40 ounces of water daily at home - Normal blood pressure and heart rate - Mildly elevated BUN at 26, appears clinically dehydrated.  No major electrolyte imbalances, normal potassium and magnesium. - PLAN: We will give 500 mL NS at clinic today.  Continue to encourage adequate oral intake (64 ounces daily). - Continue daily magnesium supplement   3.  Diarrhea, resolved - Diarrhea resolved about 2 weeks after her first cycle of chemotherapy. - She denies any diarrhea after her second cycle of chemotherapy - PLAN: Patient instructed to take Lomotil as prescribed if she has any recurrent diarrhea (Lomotil 2 tablets at first onset of diarrhea followed by 1 tablet after each watery bowel movement).  She is instructed to call our office or go to the emergency room if she has any severe diarrhea that does not improve with Lomotil.   4.  Right shoulder  pain: - She finished radiation therapy and reports some improvement in pain - PLAN: Continue oxycodone 5 mg every 8 hours as needed   5.  Nausea: - Reports that her nausea and vomiting have currently resolved - PLAN: Continue Reglan 5 mg 3 times daily before meals as needed for nausea.   6.  Mucositis - Onset of mucositis and occasional gum bleeding after her first cycle of chemotherapy, relieved by Magic Mouthwash. - She denies any mucositis after her second cycle of chemotherapy - PLAN: Continue Magic Mouthwash as needed.   7.  Shortness of breath on exertion: - Right thoracentesis on 10/07/2020 with 1.7 L clear fluid removed.  Breathing has improved. - Stable cough.  No new shortness of breath today. - PLAN: Instructed to call our office or proceed to the nearest emergency room if she has any worsening shortness of breath or dyspnea.   8.  Neuropathy - Onset of bilateral numbness/tingling in ulnar distribution of bilateral hands after first cycle of chemotherapy - Patient currently rates intensity of 3/10, states that she does not want any medication for neuropathy at this time - PLAN: Patient instructed to let us know if her neuropathy worsens or if she changes her mind and would like to try a medication to relieve her symptoms.   9.  Poor appetite - Patient reports her appetite is about 20 to 25% of usual. - 1 to 2 pounds of weight loss noted at today's visit. - She is drinking 3-4 Ensure beverages per day. - She is being followed by dietitian - PLAN: Patient encouraged to drink 4 Ensure beverages per day.  Try eating more frequent and smaller meals.  If worsening appetite or any significant weight loss, would consider appetite stimulant.   10.  Sialorrhea, resolved - Patient noted excessive drooling after resolution of her mucositis, now resolved - PLAN: Patient encouraged to call our office if her symptoms return and if she would like to try scopolamine patch for alleviation of  symptoms.   PLAN SUMMARY & DISPOSITION: Fluids given during office visit today. RTC on 01/28/2021 for MD visit, labs, next cycle of chemotherapy.  All questions were answered. The patient knows to call the clinic with any problems, questions or concerns.  Medical decision making: Moderate  Time spent on visit: I spent 20  minutes counseling the patient face to face. The total time spent in the appointment was 30 minutes and more than 50% was on counseling.   Harriett Rush, PA-C  01/14/2021 1:48 PM

## 2021-01-14 ENCOUNTER — Encounter (HOSPITAL_COMMUNITY)
Admission: RE | Admit: 2021-01-14 | Discharge: 2021-01-14 | Disposition: A | Discharge status: Home / Self Care | Payer: Medicare Other | Source: Ambulatory Visit | Attending: General Surgery | Admitting: General Surgery

## 2021-01-14 ENCOUNTER — Other Ambulatory Visit (HOSPITAL_COMMUNITY): Payer: Medicare Other

## 2021-01-14 ENCOUNTER — Other Ambulatory Visit (HOSPITAL_COMMUNITY): Payer: Self-pay | Admitting: *Deleted

## 2021-01-14 ENCOUNTER — Ambulatory Visit (INDEPENDENT_AMBULATORY_CARE_PROVIDER_SITE_OTHER): Payer: Medicare Other | Admitting: Cardiovascular Disease

## 2021-01-14 ENCOUNTER — Encounter (HOSPITAL_COMMUNITY): Payer: Self-pay | Admitting: Physician Assistant

## 2021-01-14 ENCOUNTER — Other Ambulatory Visit: Payer: Self-pay

## 2021-01-14 ENCOUNTER — Inpatient Hospital Stay (HOSPITAL_COMMUNITY): Payer: Medicare Other

## 2021-01-14 ENCOUNTER — Inpatient Hospital Stay (HOSPITAL_BASED_OUTPATIENT_CLINIC_OR_DEPARTMENT_OTHER): Payer: Medicare Other | Admitting: Physician Assistant

## 2021-01-14 VITALS — BP 138/66 | HR 71 | Temp 97.8°F | Resp 18 | Wt 151.2 lb

## 2021-01-14 DIAGNOSIS — Z87891 Personal history of nicotine dependence: Secondary | ICD-10-CM | POA: Diagnosis not present

## 2021-01-14 DIAGNOSIS — Z01812 Encounter for preprocedural laboratory examination: Secondary | ICD-10-CM | POA: Insufficient documentation

## 2021-01-14 DIAGNOSIS — Z5181 Encounter for therapeutic drug level monitoring: Secondary | ICD-10-CM | POA: Diagnosis not present

## 2021-01-14 DIAGNOSIS — C3411 Malignant neoplasm of upper lobe, right bronchus or lung: Secondary | ICD-10-CM | POA: Insufficient documentation

## 2021-01-14 DIAGNOSIS — Z7901 Long term (current) use of anticoagulants: Secondary | ICD-10-CM | POA: Insufficient documentation

## 2021-01-14 DIAGNOSIS — Z5111 Encounter for antineoplastic chemotherapy: Secondary | ICD-10-CM | POA: Diagnosis not present

## 2021-01-14 DIAGNOSIS — C7A00 Malignant carcinoid tumor of unspecified site: Secondary | ICD-10-CM | POA: Diagnosis not present

## 2021-01-14 DIAGNOSIS — Z79899 Other long term (current) drug therapy: Secondary | ICD-10-CM | POA: Diagnosis not present

## 2021-01-14 DIAGNOSIS — C7A090 Malignant carcinoid tumor of the bronchus and lung: Secondary | ICD-10-CM | POA: Diagnosis not present

## 2021-01-14 DIAGNOSIS — I480 Paroxysmal atrial fibrillation: Secondary | ICD-10-CM

## 2021-01-14 DIAGNOSIS — Z923 Personal history of irradiation: Secondary | ICD-10-CM | POA: Diagnosis not present

## 2021-01-14 DIAGNOSIS — Z952 Presence of prosthetic heart valve: Secondary | ICD-10-CM

## 2021-01-14 LAB — CBC WITH DIFFERENTIAL/PLATELET
Band Neutrophils: 13 %
Basophils Absolute: 0 10*3/uL (ref 0.0–0.1)
Basophils Relative: 0 %
Eosinophils Absolute: 0.4 10*3/uL (ref 0.0–0.5)
Eosinophils Relative: 10 %
HCT: 40.9 % (ref 36.0–46.0)
Hemoglobin: 12.7 g/dL (ref 12.0–15.0)
Lymphocytes Relative: 26 %
Lymphs Abs: 1 10*3/uL (ref 0.7–4.0)
MCH: 29.4 pg (ref 26.0–34.0)
MCHC: 31.1 g/dL (ref 30.0–36.0)
MCV: 94.7 fL (ref 80.0–100.0)
Metamyelocytes Relative: 2 %
Monocytes Absolute: 0.7 10*3/uL (ref 0.1–1.0)
Monocytes Relative: 18 %
Neutro Abs: 1.7 10*3/uL (ref 1.7–7.7)
Neutrophils Relative %: 31 %
Platelets: 75 10*3/uL — ABNORMAL LOW (ref 150–400)
RBC: 4.32 MIL/uL (ref 3.87–5.11)
RDW: 18.1 % — ABNORMAL HIGH (ref 11.5–15.5)
Smear Review: DECREASED
WBC: 3.9 10*3/uL — ABNORMAL LOW (ref 4.0–10.5)
nRBC: 0 % (ref 0.0–0.2)

## 2021-01-14 LAB — BASIC METABOLIC PANEL
Anion gap: 10 (ref 5–15)
BUN: 26 mg/dL — ABNORMAL HIGH (ref 8–23)
CO2: 27 mmol/L (ref 22–32)
Calcium: 9.1 mg/dL (ref 8.9–10.3)
Chloride: 99 mmol/L (ref 98–111)
Creatinine, Ser: 1.09 mg/dL — ABNORMAL HIGH (ref 0.44–1.00)
GFR, Estimated: 52 mL/min — ABNORMAL LOW (ref >60)
Glucose, Bld: 130 mg/dL — ABNORMAL HIGH (ref 70–99)
Potassium: 4.7 mmol/L (ref 3.5–5.1)
Sodium: 136 mmol/L (ref 135–145)

## 2021-01-14 LAB — MAGNESIUM: Magnesium: 1.9 mg/dL (ref 1.7–2.4)

## 2021-01-14 LAB — PROTIME-INR
INR: 1 (ref 0.8–1.2)
Prothrombin Time: 13.6 seconds (ref 11.4–15.2)

## 2021-01-14 LAB — POCT INR: INR: 1.6 — AB (ref 2.0–3.0)

## 2021-01-14 MED ORDER — SODIUM CHLORIDE 0.9 % IV SOLN: INTRAVENOUS | Status: DC

## 2021-01-14 NOTE — Progress Notes (Signed)
Patient seen today by PA, labs reviewed and will give 500 ml of Normal saline over an hour per orders.   Patient tolerated it well without problems. Vitals stable and discharged home from clinic via clinic. Follow up as scheduled.

## 2021-01-14 NOTE — Patient Instructions (Signed)
Description   Spoke to pt and instructed her to resume her normal dose of warfarin on 11/16 unless the Dr says other wise. Normal dose: warfarin 1 tablet (2.5mg ) daily. Recheck INR in 1 week. Coumadin Clinic [phone]

## 2021-01-14 NOTE — Patient Instructions (Addendum)
[place] at [facility] Discharge Instructions  You were seen today by Tarri Abernethy PA-C for your post-chemotherapy symptom management visit.  Please see the instructions below regarding your various symptoms.  - DIARRHEA: If you have any additional diarrhea, take Lomotil as needed.  Take 2 tablets with first onset of diarrhea followed by 1 tablet after each watery bowel movement.  - NAUSEA: If you have any additional nausea or vomiting, take Reglan 5 mg before meals as, up to 3 times daily.  - RIGHT SHOULDER PAIN: Continue to take oxycodone 5 mg every 8 hours as needed.  Please call our office if you have severe pain that is not relieved by your oxycodone.  - MOUTH SORES: Continue Magic mouthwash as needed for mouth pain or sores in your mouth.  - NEUROPATHY: The numbness and tingling in your hands is a common side effect from chemotherapy, but there are medications available to help with this if you decide that you would like to try something to help with this symptom.  Please call our office if your neuropathy worsens or if you would like some medication to relieve your numbness/tingling.  - SHORTNESS OF BREATH: Please call our office if you have any new or worsening shortness of breath, since this may be a sign that you have fluid building up in your lungs again.  - HYDRATION: Make sure that you are drinking at least 64 ounces of water per day.  Drink at least 3 of your 20 ounce water bottles per day.  - NUTRITION: Drink at least 3-4 Ensure beverages per day.  You may want to add ice cream to them to make a milkshake at home once per day.  Try eating more frequent and smaller meals throughout the day.  It is extremely important that you maintain adequate nutrition while you undergo cancer treatment.  - FATIGUE: Try to be as active as you are able to, but allow yourself to get plenty of rest in between activities.  **If you experience any NEW OR WORSENING  SYMPTOMS before your next appointment, please call our office to receive advice from nursing staff, or to schedule a same-day symptom management visit.  If your symptoms are severe or you are unable to get a hold of our staff, please proceed to the emergency department.  FOLLOW-UP APPOINTMENT: You are scheduled for repeat labs, office visit with Dr. Delton Coombes, and your next cycle of chemotherapy to start on Tuesday, 01/28/2021.  _____________________________________________________________  Thank you for choosing [place] at [facility] to provide your oncology and hematology care.  To afford each patient quality time with our provider, please arrive at least 15 minutes before your scheduled appointment time.   If you have a lab appointment with the [facility] please come in thru the Main Entrance and check in at the main information desk.  You need to re-schedule your appointment should you arrive 10 or more minutes late.  We strive to give you quality time with our providers, and arriving late affects you and other patients whose appointments are after yours.  Also, if you no show three or more times for appointments you may be dismissed from the clinic at the providers discretion.     Again, thank you for choosing [facility].  Our hope is that these requests will decrease the amount of time that you wait before being seen by our physicians.       _____________________________________________________________  Should you have questions after your visit to [facility], please contact our office at [phone] and follow the prompts.  Our office hours are 8:00 a.m. and 4:30 p.m. Monday - Friday.  Please note that voicemails left after 4:00 p.m. may not be returned until the following business day.  We are closed weekends and major holidays.  You do have access to a nurse 24-7, just call the main number to the clinic [phone] and do not press  any options, hold on the line and a nurse will answer the phone.    For prescription refill requests, have your pharmacy contact our office and allow 72 hours.    Due to Covid, you will need to wear a mask upon entering the hospital. If you do not have a mask, a mask will be given to you at the Main Entrance upon arrival. For doctor visits, patients may have 1 support person age 10 or older with them. For treatment visits, patients can not have anyone with them due to social distancing guidelines and our immunocompromised population.

## 2021-01-15 ENCOUNTER — Encounter (HOSPITAL_COMMUNITY): Payer: Self-pay | Admitting: General Surgery

## 2021-01-15 ENCOUNTER — Ambulatory Visit (HOSPITAL_COMMUNITY): Payer: Medicare Other | Admitting: Certified Registered"

## 2021-01-15 ENCOUNTER — Encounter (HOSPITAL_COMMUNITY): Payer: Medicare Other

## 2021-01-15 ENCOUNTER — Ambulatory Visit (HOSPITAL_COMMUNITY)
Admission: RE | Admit: 2021-01-15 | Discharge: 2021-01-15 | Disposition: A | Discharge status: Home / Self Care | Payer: Medicare Other | Attending: General Surgery | Admitting: General Surgery

## 2021-01-15 ENCOUNTER — Encounter (HOSPITAL_COMMUNITY)
Admission: RE | Disposition: A | Discharge status: Home / Self Care | Payer: Self-pay | Source: Home / Self Care | Attending: General Surgery

## 2021-01-15 ENCOUNTER — Ambulatory Visit (HOSPITAL_COMMUNITY): Payer: Medicare Other

## 2021-01-15 DIAGNOSIS — J90 Pleural effusion, not elsewhere classified: Secondary | ICD-10-CM | POA: Diagnosis not present

## 2021-01-15 DIAGNOSIS — I2510 Atherosclerotic heart disease of native coronary artery without angina pectoris: Secondary | ICD-10-CM | POA: Diagnosis not present

## 2021-01-15 DIAGNOSIS — Z8673 Personal history of transient ischemic attack (TIA), and cerebral infarction without residual deficits: Secondary | ICD-10-CM | POA: Diagnosis not present

## 2021-01-15 DIAGNOSIS — Z452 Encounter for adjustment and management of vascular access device: Secondary | ICD-10-CM | POA: Diagnosis not present

## 2021-01-15 DIAGNOSIS — G709 Myoneural disorder, unspecified: Secondary | ICD-10-CM | POA: Diagnosis not present

## 2021-01-15 DIAGNOSIS — C3411 Malignant neoplasm of upper lobe, right bronchus or lung: Secondary | ICD-10-CM | POA: Insufficient documentation

## 2021-01-15 DIAGNOSIS — K219 Gastro-esophageal reflux disease without esophagitis: Secondary | ICD-10-CM | POA: Diagnosis not present

## 2021-01-15 DIAGNOSIS — J449 Chronic obstructive pulmonary disease, unspecified: Secondary | ICD-10-CM | POA: Diagnosis not present

## 2021-01-15 DIAGNOSIS — I10 Essential (primary) hypertension: Secondary | ICD-10-CM | POA: Diagnosis not present

## 2021-01-15 DIAGNOSIS — Z7901 Long term (current) use of anticoagulants: Secondary | ICD-10-CM | POA: Insufficient documentation

## 2021-01-15 DIAGNOSIS — Z95828 Presence of other vascular implants and grafts: Secondary | ICD-10-CM

## 2021-01-15 DIAGNOSIS — C3491 Malignant neoplasm of unspecified part of right bronchus or lung: Secondary | ICD-10-CM | POA: Diagnosis not present

## 2021-01-15 DIAGNOSIS — Z87891 Personal history of nicotine dependence: Secondary | ICD-10-CM | POA: Diagnosis not present

## 2021-01-15 DIAGNOSIS — D6959 Other secondary thrombocytopenia: Secondary | ICD-10-CM | POA: Insufficient documentation

## 2021-01-15 HISTORY — PX: PORTACATH PLACEMENT: SHX2246

## 2021-01-15 SURGERY — INSERTION, TUNNELED CENTRAL VENOUS DEVICE, WITH PORT
Anesthesia: General | Site: Chest | Laterality: Left

## 2021-01-15 MED ORDER — KETOROLAC TROMETHAMINE 30 MG/ML IJ SOLN
15.0000 mg | Freq: Once | INTRAMUSCULAR | Status: AC
  Administered 2021-01-15: 15 mg via INTRAVENOUS
  Filled 2021-01-15: qty 1

## 2021-01-15 MED ORDER — FENTANYL CITRATE (PF) 100 MCG/2ML IJ SOLN
INTRAMUSCULAR | Status: AC
  Filled 2021-01-15: qty 2

## 2021-01-15 MED ORDER — CHLORHEXIDINE GLUCONATE 0.12 % MT SOLN
15.0000 mL | Freq: Once | OROMUCOSAL | Status: AC
  Administered 2021-01-15: 15 mL via OROMUCOSAL

## 2021-01-15 MED ORDER — LIDOCAINE HCL (PF) 2 % IJ SOLN
INTRAMUSCULAR | Status: AC
  Filled 2021-01-15: qty 5

## 2021-01-15 MED ORDER — PROPOFOL 10 MG/ML IV BOLUS
INTRAVENOUS | Status: AC
  Filled 2021-01-15: qty 20

## 2021-01-15 MED ORDER — CEFAZOLIN SODIUM-DEXTROSE 2-4 GM/100ML-% IV SOLN
INTRAVENOUS | Status: AC
  Filled 2021-01-15: qty 100

## 2021-01-15 MED ORDER — LACTATED RINGERS IV SOLN: INTRAVENOUS | Status: DC

## 2021-01-15 MED ORDER — DEXMEDETOMIDINE (PRECEDEX) IN NS 20 MCG/5ML (4 MCG/ML) IV SYRINGE
PREFILLED_SYRINGE | INTRAVENOUS | Status: AC
  Filled 2021-01-15: qty 5

## 2021-01-15 MED ORDER — ORAL CARE MOUTH RINSE: 15.0000 mL | Freq: Once | OROMUCOSAL | Status: AC

## 2021-01-15 MED ORDER — CHLORHEXIDINE GLUCONATE CLOTH 2 % EX PADS: 6.0000 | MEDICATED_PAD | Freq: Once | CUTANEOUS | Status: DC

## 2021-01-15 MED ORDER — FENTANYL CITRATE (PF) 100 MCG/2ML IJ SOLN
INTRAMUSCULAR | Status: DC | PRN
  Administered 2021-01-15 (×2): 50 ug via INTRAVENOUS

## 2021-01-15 MED ORDER — LIDOCAINE HCL (PF) 1 % IJ SOLN
INTRAMUSCULAR | Status: AC
  Filled 2021-01-15: qty 30

## 2021-01-15 MED ORDER — LIDOCAINE HCL (PF) 1 % IJ SOLN
INTRAMUSCULAR | Status: DC | PRN
  Administered 2021-01-15: 6 mL

## 2021-01-15 MED ORDER — ONDANSETRON HCL 4 MG/2ML IJ SOLN
INTRAMUSCULAR | Status: DC | PRN
  Administered 2021-01-15: 4 mg via INTRAVENOUS

## 2021-01-15 MED ORDER — SODIUM CHLORIDE (PF) 0.9 % IJ SOLN
INTRAMUSCULAR | Status: DC | PRN
  Administered 2021-01-15: 500 mL

## 2021-01-15 MED ORDER — PROPOFOL 500 MG/50ML IV EMUL
INTRAVENOUS | Status: DC | PRN
  Administered 2021-01-15: 50 ug/kg/min via INTRAVENOUS

## 2021-01-15 MED ORDER — HEPARIN SOD (PORK) LOCK FLUSH 100 UNIT/ML IV SOLN
INTRAVENOUS | Status: DC | PRN
  Administered 2021-01-15: 500 [IU] via INTRAVENOUS

## 2021-01-15 MED ORDER — ONDANSETRON HCL 4 MG/2ML IJ SOLN
INTRAMUSCULAR | Status: AC
  Filled 2021-01-15: qty 2

## 2021-01-15 MED ORDER — PROPOFOL 10 MG/ML IV BOLUS
INTRAVENOUS | Status: DC | PRN
  Administered 2021-01-15 (×2): 20 mg via INTRAVENOUS

## 2021-01-15 MED ORDER — HEPARIN SOD (PORK) LOCK FLUSH 100 UNIT/ML IV SOLN
INTRAVENOUS | Status: AC
  Filled 2021-01-15: qty 5

## 2021-01-15 MED ORDER — CEFAZOLIN SODIUM-DEXTROSE 2-4 GM/100ML-% IV SOLN
2.0000 g | INTRAVENOUS | Status: AC
  Administered 2021-01-15: 2 g via INTRAVENOUS

## 2021-01-15 MED ORDER — LIDOCAINE 2% (20 MG/ML) 5 ML SYRINGE
INTRAMUSCULAR | Status: DC | PRN
  Administered 2021-01-15: 80 mg via INTRAVENOUS

## 2021-01-15 MED ORDER — SEVOFLURANE IN SOLN
RESPIRATORY_TRACT | Status: AC
  Filled 2021-01-15: qty 250

## 2021-01-15 SURGICAL SUPPLY — 28 items
ADH SKN CLS APL DERMABOND .7 (GAUZE/BANDAGES/DRESSINGS) ×1
APL PRP STRL LF ISPRP CHG 10.5 (MISCELLANEOUS) ×1
APPLICATOR CHLORAPREP 10.5 ORG (MISCELLANEOUS) ×3 IMPLANT
BAG DECANTER FOR FLEXI CONT (MISCELLANEOUS) ×3 IMPLANT
CLOTH BEACON ORANGE TIMEOUT ST (SAFETY) ×3 IMPLANT
COVER LIGHT HANDLE STERIS (MISCELLANEOUS) ×6 IMPLANT
DERMABOND ADVANCED (GAUZE/BANDAGES/DRESSINGS) ×2
DERMABOND ADVANCED .7 DNX12 (GAUZE/BANDAGES/DRESSINGS) ×1 IMPLANT
DRAPE C-ARM FOLDED MOBILE STRL (DRAPES) ×3 IMPLANT
ELECT REM PT RETURN 9FT ADLT (ELECTROSURGICAL) ×3
ELECTRODE REM PT RTRN 9FT ADLT (ELECTROSURGICAL) ×1 IMPLANT
GLOVE SURG POLYISO LF SZ7.5 (GLOVE) ×3 IMPLANT
GLOVE SURG UNDER POLY LF SZ7 (GLOVE) ×6 IMPLANT
GOWN STRL REUS W/TWL LRG LVL3 (GOWN DISPOSABLE) ×6 IMPLANT
IV NS 500ML (IV SOLUTION) ×3
IV NS 500ML BAXH (IV SOLUTION) ×1 IMPLANT
KIT PORT POWER 8FR ISP MRI (Port) ×3 IMPLANT
KIT TURNOVER KIT A (KITS) ×3 IMPLANT
NDL HYPO 25X1 1.5 SAFETY (NEEDLE) ×1 IMPLANT
NEEDLE HYPO 25X1 1.5 SAFETY (NEEDLE) ×3 IMPLANT
PACK MINOR (CUSTOM PROCEDURE TRAY) ×3 IMPLANT
PAD ARMBOARD 7.5X6 YLW CONV (MISCELLANEOUS) ×3 IMPLANT
SET BASIN LINEN APH (SET/KITS/TRAYS/PACK) ×3 IMPLANT
SUT MNCRL AB 4-0 PS2 18 (SUTURE) ×3 IMPLANT
SUT VIC AB 3-0 SH 27 (SUTURE) ×3
SUT VIC AB 3-0 SH 27X BRD (SUTURE) ×1 IMPLANT
SYR 5ML LL (SYRINGE) ×3 IMPLANT
SYR CONTROL 10ML LL (SYRINGE) ×3 IMPLANT

## 2021-01-15 NOTE — Interval H&P Note (Signed)
History and Physical Interval Note:  01/15/2021 11:57 AM  Katelyn [name]  has presented today for surgery, with the diagnosis of Right lung cancer.  The various methods of treatment have been discussed with the patient and family. After consideration of risks, benefits and other options for treatment, the patient has consented to  Procedure(s): INSERTION PORT-A-CATH (Left) as a surgical intervention.  The patient's history has been reviewed, patient examined, no change in status, stable for surgery.  I have reviewed the patient's chart and labs.  Questions were answered to the patient's satisfaction.     Aviva Signs

## 2021-01-15 NOTE — Op Note (Signed)
Patient:  Katelyn [name]  DOB:  November 21, 1940  MRN:  [id number]   Preop Diagnosis: Right lung carcinoma, need for central venous access  Postop Diagnosis: Same  Procedure: Port-A-Cath insertion  Surgeon: Aviva Signs, MD  Anes: MAC  Indications: Patient is a 80 year old white female who is about to undergo chemotherapy for right lung carcinoma.  She needs a Port-A-Cath placed for central venous access.  The risks and benefits of the procedure including bleeding, infection, pneumothorax were fully explained to the patient, who gave informed consent.  Procedure note: The patient was placed in the Trendelenburg position after the left upper chest was prepped and draped using the usual sterile technique with ChloraPrep.  Surgical site confirmation was performed.  1% Xylocaine was used for local anesthesia.  Incision was made below the left clavicle.  A subcutaneous pocket was formed.  A needle was advanced into the left subclavian vein using the Seldinger technique without difficulty.  A guidewire was then advanced into the right atrium under fluoroscopic guidance.  An introducer and peel-away sheath were placed over the guidewire.  The catheter was inserted through the peel-away sheath and the peel-away sheath was removed.  The catheter was then attached to the port and the port placed in subcutaneous pocket.  Adequate positioning was confirmed by fluoroscopy.  Good backflow of venous blood was noted on aspiration of the port.  The port was flushed with heparin flush.  The subcutaneous layer was reapproximated using a 3-0 Vicryl interrupted suture.  The skin was closed using a 4-0 Monocryl subcuticular suture.  Dermabond was applied.  All tape and needle counts were correct at the end of the procedure.  The patient was awakened and transferred to PACU in stable condition.  A chest x-ray will be performed at that time.  Complications: None  EBL: Minimal  Specimen: None

## 2021-01-15 NOTE — Anesthesia Postprocedure Evaluation (Signed)
Anesthesia Post Note  Patient: Katelyn [name]  Procedure(s) Performed: INSERTION PORT-A-CATH (Left: Chest)  Patient location during evaluation: Phase II Anesthesia Type: General Level of consciousness: awake Pain management: pain level controlled Vital Signs Assessment: post-procedure vital signs reviewed and stable Respiratory status: spontaneous breathing and respiratory function stable Cardiovascular status: blood pressure returned to baseline and stable Postop Assessment: no headache and no apparent nausea or vomiting Anesthetic complications: no Comments: Late entry   No notable events documented.   Last Vitals:  Vitals:   01/15/21 1330 01/15/21 1342  BP: (!) 129/51   Pulse: 66   Resp: 16 16  Temp:    SpO2: 91%     Last Pain:  Vitals:   01/15/21 1342  PainSc: 0-No pain                 Louann Sjogren

## 2021-01-15 NOTE — Anesthesia Procedure Notes (Signed)
Date/Time: 01/15/2021 12:44 PM Performed by: Orlie Dakin, CRNA Pre-anesthesia Checklist: Patient identified, Emergency Drugs available, Suction available and Patient being monitored Patient Re-evaluated:Patient Re-evaluated prior to induction Oxygen Delivery Method: Nasal cannula Induction Type: IV induction Placement Confirmation: positive ETCO2

## 2021-01-15 NOTE — Transfer of Care (Signed)
Immediate Anesthesia Transfer of Care Note  Patient: Katelyn [name]  Procedure(s) Performed: INSERTION PORT-A-CATH (Left: Chest)  Patient Location: PACU  Anesthesia Type:General  Level of Consciousness: awake, alert  and oriented  Airway & Oxygen Therapy: Patient Spontanous Breathing  Post-op Assessment: Report given to RN, Post -op Vital signs reviewed and stable and Patient moving all extremities X 4  Post vital signs: Reviewed and stable  Last Vitals:  Vitals Value Taken Time  BP    Temp    Pulse    Resp    SpO2      Last Pain:  Vitals:   01/15/21 1037  PainSc: 0-No pain      Patients Stated Pain Goal: 4 (02/33/43 5686)  Complications: No notable events documented.

## 2021-01-15 NOTE — Discharge Instructions (Signed)
Restart Coumadin tomorrow.

## 2021-01-15 NOTE — Anesthesia Preprocedure Evaluation (Signed)
Anesthesia Evaluation  Patient identified by MRN, date of birth, ID band Patient awake    Reviewed: Allergy & Precautions, H&P , NPO status , Patient's Chart, lab work & pertinent test results, reviewed documented beta blocker date and time   Airway Mallampati: II  TM Distance: >3 FB Neck ROM: full    Dental no notable dental hx.    Pulmonary COPD, former smoker,    Pulmonary exam normal breath sounds clear to auscultation       Cardiovascular Exercise Tolerance: Good hypertension, + CAD   Rhythm:regular Rate:Normal     Neuro/Psych  Neuromuscular disease CVA, Residual Symptoms negative psych ROS   GI/Hepatic Neg liver ROS, GERD  Medicated,  Endo/Other  negative endocrine ROS  Renal/GU negative Renal ROS  negative genitourinary   Musculoskeletal   Abdominal   Peds  Hematology negative hematology ROS (+)   Anesthesia Other Findings Lung Cancer  Reproductive/Obstetrics negative OB ROS                             Anesthesia Physical Anesthesia Plan  ASA: 3  Anesthesia Plan: General   Post-op Pain Management:    Induction:   PONV Risk Score and Plan: Propofol infusion  Airway Management Planned:   Additional Equipment:   Intra-op Plan:   Post-operative Plan:   Informed Consent: I have reviewed the patients History and Physical, chart, labs and discussed the procedure including the risks, benefits and alternatives for the proposed anesthesia with the patient or authorized representative who has indicated his/her understanding and acceptance.     Dental Advisory Given  Plan Discussed with: CRNA  Anesthesia Plan Comments:         Anesthesia Quick Evaluation

## 2021-01-16 ENCOUNTER — Encounter (HOSPITAL_COMMUNITY): Payer: Self-pay | Admitting: General Surgery

## 2021-01-18 ENCOUNTER — Other Ambulatory Visit: Payer: Self-pay | Admitting: Cardiovascular Disease

## 2021-01-18 ENCOUNTER — Other Ambulatory Visit: Payer: Self-pay | Admitting: Thoracic Surgery (Cardiothoracic Vascular Surgery)

## 2021-01-20 ENCOUNTER — Other Ambulatory Visit (INDEPENDENT_AMBULATORY_CARE_PROVIDER_SITE_OTHER): Payer: Self-pay | Admitting: Nurse Practitioner

## 2021-01-20 DIAGNOSIS — Z79890 Hormone replacement therapy (postmenopausal): Secondary | ICD-10-CM

## 2021-01-21 ENCOUNTER — Ambulatory Visit (INDEPENDENT_AMBULATORY_CARE_PROVIDER_SITE_OTHER): Payer: Medicare Other | Admitting: Internal Medicine

## 2021-01-21 DIAGNOSIS — C7B8 Other secondary neuroendocrine tumors: Secondary | ICD-10-CM | POA: Diagnosis not present

## 2021-01-21 DIAGNOSIS — Z7901 Long term (current) use of anticoagulants: Secondary | ICD-10-CM | POA: Diagnosis not present

## 2021-01-21 DIAGNOSIS — C7A8 Other malignant neuroendocrine tumors: Secondary | ICD-10-CM | POA: Diagnosis not present

## 2021-01-21 DIAGNOSIS — Z5181 Encounter for therapeutic drug level monitoring: Secondary | ICD-10-CM

## 2021-01-21 DIAGNOSIS — Z510 Encounter for antineoplastic radiation therapy: Secondary | ICD-10-CM | POA: Diagnosis not present

## 2021-01-21 LAB — POCT INR: INR: 2 (ref 2.0–3.0)

## 2021-01-21 NOTE — Patient Instructions (Signed)
Description   Called and spoke to pt and instructed her to continue taking warfarin 1 tablet (2.5mg ) daily. Recheck INR in 1 week. Coumadin Clinic [phone]

## 2021-01-24 ENCOUNTER — Other Ambulatory Visit: Payer: Self-pay | Admitting: Cardiovascular Disease

## 2021-01-24 ENCOUNTER — Other Ambulatory Visit: Payer: Self-pay | Admitting: Thoracic Surgery (Cardiothoracic Vascular Surgery)

## 2021-01-27 LAB — POCT INR: INR: 2.4 (ref 2.0–3.0)

## 2021-01-27 NOTE — Progress Notes (Signed)
[place], [place] [address]   CLINIC:  Medical Oncology/Hematology  PCP:  Celene Squibb, MD [address] [name] [address] [phone]   REASON FOR VISIT:  Follow-up for right lung cancer  PRIOR THERAPY: Right upper lobectomy (08/15/20)  NGS Results: not done  CURRENT THERAPY: Carboplatin, Taxol, and Keytruda every 3 weeks x 4 cycles; to be followed by Beryle Flock maintenance every 3 weeks  BRIEF ONCOLOGIC HISTORY:  Oncology History Overview Note  Foundation One PD-L1 TPS score 1 16 mut/MB KRASG12C mutation MSI stable   Cancer of upper lobe of right lung ([facility])  08/15/2020 Pathology Results   FINAL MICROSCOPIC DIAGNOSIS:   A. 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  - Tumor is limited to lung.  - Margins are negative for carcinoma.  - One of one lymph nodes negative for carcinoma (0/1).  - See oncology table.   B. LYMPH NODE, 7, EXCISION:  - One of one lymph nodes negative for carcinoma (0/1).   C. LYMPH NODE, 7 #2, EXCISION:  - One of one lymph nodes negative for carcinoma (0/1).   D. LYMPH NODE, 9, EXCISION:  - One of one lymph nodes negative for carcinoma (0/1).   E. LYMPH NODE, 8, EXCISION:  - One of one lymph nodes negative for carcinoma (0/1).   F. LYMPH NODE, 7 #3, EXCISION:  - One of one lymph nodes negative for carcinoma (0/1).   G. LYMPH NODE, 11, EXCISION:  - One of one lymph nodes negative for carcinoma (0/1).   H. LYMPH NODE, 10, EXCISION:  - One of one lymph nodes negative for carcinoma (0/1).   I. LYMPH NODE, 4R, EXCISION:  - One of one lymph nodes negative for carcinoma (0/1).   J. LYMPH NODE, 10 #2, EXCISION:   - One of one lymph nodes negative for carcinoma (0/1).  - Fibrosis and calcifications.   K. LYMPH NODE, 12, EXCISION:  - One of one lymph nodes negative for carcinoma (0/1).   L. LYMPH NODE, 12 #2, EXCISION:  - One of one  lymph nodes negative for carcinoma (0/1).   ONCOLOGY TABLE:   LUNG: Resection   Synchronous Tumors: Not applicable  Total Number of Primary Tumors: 1  Procedure: Right upper lobe lobectomy with lymph node biopsies  Specimen Laterality: Right  Tumor Focality: Unifocal  Tumor Site: Right upper lobe  Tumor Size: 4.4 cm  Histologic Type: Large cell neuroendocrine carcinoma  Visceral Pleura Invasion: Not identified  Direct Invasion of Adjacent Structures: No adjacent structures present  Lymphovascular Invasion: Not identified  Margins: All margins negative for invasive carcinoma       Closest Margin(s) to Invasive Carcinoma: 1.5 cm to bronchial margin  Treatment Effect: No known presurgical therapy  Regional Lymph Nodes:       Number of Lymph Nodes Involved: 0                            Nodal Sites with Tumor: Not applicable       Number of Lymph Nodes Examined: 12                       Nodal Sites Examined: 4, 7, 8, 9, 10, 11, 12  Distant Metastasis:       Distant Site(s) Involved: Not applicable  Pathologic Stage Classification (pTNM, AJCC 8th  Edition): pT2b, pN0  Ancillary Studies: Can be performed upon request  Representative Tumor Block: A2  Comment(s): The tumor has a subtle neuroendocrine features, cribriform areas with necrosis, and geographic areas of necrosis.  Immunohistochemistry is positive for CD56, synaptophysin, and TTF-1.  Chromogranin is largely negative. Ki-67 is elevated. Dr. Saralyn Pilar has reviewed the case.     08/15/2020 Surgery   PREOPERATIVE DIAGNOSIS:  Right upper lobe lung mass, suspected non-small cell carcinoma.   POSTOPERATIVE DIAGNOSIS:  Non-small cell carcinoma, right upper lobe, clinical stage IB (T2, N0).   PROCEDURE:   Xi robotic-assisted right upper lobectomy,  Lymph node dissection,  Intercostal nerve blocks levels 3 through 10.   SURGEON:  Modesto Charon, MD    FINDINGS:  Fragile tissue.  Enlarged but otherwise benign appearing nodes.   One calcified node.  Frozen section revealed non-small cell carcinoma.  Bronchial margin clear.   09/04/2020 Initial Diagnosis   Cancer of upper lobe of right lung ([facility])   09/04/2020 Cancer Staging   Staging form: Lung, AJCC 8th Edition - Pathologic stage from 09/04/2020: pT2, pN0, cM0 - Signed by Heath Lark, MD on 09/04/2020 Stage prefix: Initial diagnosis    09/11/2020 Genetic Testing   PD-L1 Results:     12/11/2020 Cancer Staging   Staging form: Lung, AJCC 8th Edition - Pathologic stage from 12/11/2020: Stage IVB (pT2b, pN0, pM1c) - Signed by Derek Jack, MD on 12/11/2020 Histopathologic type: Large cell neuroendocrine carcinoma    12/17/2020 -  Chemotherapy   Patient is on Treatment Plan : LUNG NSCLC Carboplatin + Paclitaxel + Pembrolizumab q21d x 4 cycles / Pembrolizumab Maintenance Q21D       CANCER STAGING:  Cancer Staging  Cancer of upper lobe of right lung [facility]) Staging form: Lung, AJCC 8th Edition - Pathologic stage from 09/04/2020: pT2, pN0, cM0 - Signed by Heath Lark, MD on 09/04/2020 - Pathologic stage from 12/11/2020: Stage IVB (pT2b, pN0, pM1c) - Signed by Derek Jack, MD on 12/11/2020   INTERVAL HISTORY:  Katelyn [name], a 80 y.o. female, returns for routine follow-up and consideration for next cycle of chemotherapy. Tamikka was last seen on 01/07/2021.  Due for cycle #3 of Carboplatin, Taxol, and Keytruda  today.   Overall, she tells me she has been feeling pretty well. Her SOB has improved and now SOB only occurs upon exertion. She denies n/v/d. She is taking oxycodone as needed, about every 3 days. Her appetite is poor. She reports she mostly eats fruit. She reports constant tingling in her index and middle fingers bilaterally; she denies any associated pain. She denies bleeding gums. She is drinking 3 bottles of water daily. Her energy levels are low and limiting the activities she is able to do at home; she reports she has been unable to cook for  months. She reports her fatigue is most severe the day following treatment. She walks with the assistance of a walker. She reports subcutaneous nodules are still present, but no new nodules have developed.  Overall, she feels ready for next cycle of chemo today.   REVIEW OF SYSTEMS:  Review of Systems  Constitutional:  Positive for appetite change (25%) and fatigue (25%).  Respiratory:  Positive for cough and shortness of breath.   Gastrointestinal:  Negative for diarrhea, nausea and vomiting.  Neurological:  Positive for numbness.  All other systems reviewed and are negative.  PAST MEDICAL/SURGICAL HISTORY:  Past Medical History:  Diagnosis Date   Allergy    rhinitis   Aortic  stenosis, severe    a. s/p TAVR 08/2014.   Arthritis    spine and various joints   Cancer of upper lobe of right lung ([facility]) 09/04/2020   Carotid artery disease ([facility])    Cataract    Cerebrovascular disease 12/05/2018   Childhood asthma    Chronic bronchitis ([facility] [place] [facility])        Claudication ([facility])    COPD GOLD II 07/18/2020   Coronary artery disease    a. April 2012 which revealed mid RCA occlusion with collaterals, 50% LAD stenosis, 30% circumflex stenosis, and 40% marginal stenosis   First degree AV block    GERD (gastroesophageal reflux disease)    Heart murmur    Hyperlipidemia    Hypertension    Left bundle branch block    LUMBAR RADICULOPATHY, RIGHT    PAF (paroxysmal atrial fibrillation) (H[facility])    Peripheral vascular disease ([facility])    a. s/p aortobifem bypass graft surgery and bilateral fundoplasty by [facility].   Renal artery stenosis ([facility])    a. renal artery stenosis (>60% L renal artery) by duplex 04/2020.   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 bilateral subclavian stenosis with antegrade flow by duplex 07/2020   Past Surgical History:  Procedure Laterality Date   AORTO-FEMORAL BYPASS GRAFT Bilateral 06/27/10   BASAL CELL CARCINOMA EXCISION Left    face   CARDIAC CATHETERIZATION N/A 07/25/2014   Procedure: Right/Left Heart Cath and Coronary Angiography;  Surgeon: Troy Sine, MD;  Location: [facility];  Service: Cardiovascular;  Laterality: N/A;   CATARACT EXTRACTION, BILATERAL Bilateral 09/29/2016   COLONOSCOPY  2008   Dr. Laural Golden: normal.    DILATION AND CURETTAGE OF UTERUS  "2 or 3"   ESOPHAGOGASTRODUODENOSCOPY N/A 08/18/2017   Dr. Gala Romney: Normal-appearing esophagus status post dilation.  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  Small hiatal hernia.   INTERCOSTAL NERVE BLOCK Right 08/15/2020   Procedure: INTERCOSTAL NERVE BLOCK RIGHT;  Surgeon: Melrose Nakayama, MD;  Location: [facility];  Service: Thoracic;  Laterality: Right;   IR THORACENTESIS ASP PLEURAL SPACE W/IMG GUIDE  10/07/2020   MALONEY DILATION N/A 08/18/2017   Procedure: Venia Minks DILATION;  Surgeon: Daneil Dolin, MD;  [facility];  Service: Endoscopy;  Laterality: N/A;   MULTIPLE TOOTH EXTRACTIONS     NODE DISSECTION Right 08/15/2020   Procedure: NODE DISSECTION;  Surgeon: Melrose Nakayama, MD;  Location: [facility];  Service: Thoracic;  Laterality: Right;   PORTACATH PLACEMENT Left 01/15/2021   Procedure: INSERTION PORT-A-CATH;  Surgeon: Aviva Signs, MD;  Location: [facility];  Service: General;  Laterality: Left;   PR VEIN BYPASS GRAFT,AORTO-FEM-POP  06/12/10   TEE WITHOUT CARDIOVERSION N/A 08/31/2014   Procedure: TRANSESOPHAGEAL ECHOCARDIOGRAM (TEE);  Surgeon: Rexene Alberts, MD;  Location: [facility];  Service: Open Heart Surgery;  Laterality: N/A;   TRANSCATHETER AORTIC VALVE REPLACEMENT, TRANSFEMORAL N/A 08/31/2014   Procedure: TRANSCATHETER AORTIC VALVE REPLACEMENT, TRANSFEMORAL approach;  Surgeon: Rexene Alberts, MD;   Location: [facility];  Service: Open Heart Surgery;  Laterality: N/A;   TUBAL LIGATION  1970's   VAGINAL HYSTERECTOMY  1970's   Partial     SOCIAL HISTORY:  Social History   Socioeconomic History   Marital status: Widowed    Spouse name: bobby   Number of children: 1   Years of education: Not on file   Highest education level: High school graduate  Occupational History   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 Retired,used to work at [employer].Lives alone.         Patient has one daughter, 3 grands   Likes to crochet   Works in [employer].    Social Determinants of Health   Financial Resource Strain: Not on file  Food Insecurity: No Food Insecurity   Worried About Charity fundraiser in the Last Year: Never true   Ran Out of Food in the Last Year: Never true  Transportation Needs: No Transportation Needs   Lack of Transportation (Medical): No   Lack of Transportation (Non-Medical): No  Physical Activity: Not on file  Stress: Not on file  Social Connections: Not on file  Intimate Partner Violence: Not on file    FAMILY HISTORY:  Family History  Problem Relation Age of Onset   Heart disease Mother 32       died young   Varicose Veins Mother    Stroke Father    Hypertension Father    Heart disease Father 43       Aneurysm   Heart attack Father    Arthritis Brother    Stroke Brother    Diabetes Daughter    Arthritis Daughter    Heart attack Brother        unsure   Allergic rhinitis  Neg Hx    Angioedema Neg Hx    Asthma Neg Hx    Atopy Neg Hx    Eczema Neg Hx    Immunodeficiency Neg Hx    Urticaria Neg Hx     CURRENT MEDICATIONS:  Current Outpatient Medications  Medication Sig Dispense Refill   amiodarone (PACERONE) 200 MG tablet Take 1 tablet by mouth once daily 90 tablet 0   CARBOPLATIN IV Inject into the vein every 21 ( twenty-one) days.     diphenoxylate-atropine (LOMOTIL) 2.5-0.025 MG tablet Take 2 pills at the onset of diarrhea, and then 1 tablet after each episode of diarrhea. 60 tablet 3   estradiol (ESTRACE) 2 MG tablet Take 1 tablet (2 mg total) by mouth daily. 90 tablet 0   furosemide (LASIX) 20 MG tablet Take 1 tablet (20 mg total) by mouth daily. 30 tablet 1   irbesartan (AVAPRO) 300 MG tablet Take 300 mg by mouth daily.     lidocaine-prilocaine (EMLA) cream Apply a small amount to port a cath site and cover with plastic wrap 1 hour prior to infusion appointments 30 g 3   meloxicam (MOBIC) 15 MG tablet Take 1 tablet by mouth once daily (Patient taking differently: Take 15 mg by mouth daily as  needed for pain.) 30 tablet 3   metoCLOPramide (REGLAN) 5 MG tablet Take 2 tablets (10 mg total) by mouth 3 (three) times daily before meals. 30 tablet 3   metoprolol succinate (TOPROL-XL) 25 MG 24 hr tablet Take 1 tablet by mouth once daily 90 tablet 2   OVER THE COUNTER MEDICATION Apply 1 application topically every other day. Lantispetic cream     PACLITAXEL IV Inject into the vein every 21 ( twenty-one) days.     pantoprazole (PROTONIX) 40 MG tablet TAKE 1 TABLET BY MOUTH ONCE DAILY BEFORE SUPPER (Patient taking differently: Take 40 mg by mouth daily.) 90 tablet 3   Pembrolizumab (KEYTRUDA IV) Inject into the vein every 21 ( twenty-one) days.     potassium chloride (KLOR-CON) 10 MEQ tablet Take 1 tablet (10 mEq total) by mouth daily. 14 tablet 1   rosuvastatin (CRESTOR) 20 MG tablet Take 1 tablet (20 mg total) by mouth at bedtime. 90 tablet 0   Specialty  Vitamins Products (MAGNESIUM, AMINO ACID CHELATE,) 133 MG tablet Take 1 tablet by mouth 2 (two) times daily. 60 tablet 3   warfarin (COUMADIN) 2.5 MG tablet Take 1 tablet (2.5 mg total) by mouth daily. 90 tablet 0   acetaminophen (TYLENOL) 500 MG tablet Take 500 mg by mouth every 6 (six) hours as needed for moderate pain. (Patient not taking: Reported on 01/28/2021)     diphenhydrAMINE (BENADRYL) 25 MG tablet Take 25 mg by mouth every 6 (six) hours as needed for allergies. (Patient not taking: Reported on 01/28/2021)     ipratropium (ATROVENT) 0.06 % nasal spray USE 1 SPRAY(S) IN EACH NOSTRIL EVERY 6 HOURS AS NEEDED FOR RUNNY NOSE (Patient not taking: Reported on 01/28/2021) 15 mL 2   Misc. Devices MISC 5-10 mLs by Mouth Rinse route every 4 (four) hours as needed (Swish and spit). Please provide patient with 1:1 Magic Mouthwash and Viscous Lidocaine (Patient not taking: Reported on 01/28/2021) 120 mL 1   nitroGLYCERIN (NITROSTAT) 0.4 MG SL tablet Place 0.4 mg under the tongue every 5 (five) minutes as needed for chest pain. (Patient not taking: Reported on 01/28/2021)     oxyCODONE (OXY IR/ROXICODONE) 5 MG immediate release tablet Take 1 tablet (5 mg total) by mouth at bedtime as needed for severe pain. (Patient not taking: Reported on 01/28/2021) 30 tablet 0   prochlorperazine (COMPAZINE) 10 MG tablet Take 1 tablet (10 mg total) by mouth every 6 (six) hours as needed (Nausea or vomiting). (Patient not taking: Reported on 01/28/2021) 30 tablet 1   No current facility-administered medications for this visit.    ALLERGIES:  Allergies  Allergen Reactions   Adhesive [Tape] Rash   Latex Rash   Tetanus Toxoids Rash   Wound Dressing Adhesive Rash    PHYSICAL EXAM:  Performance status (ECOG): 2 - Symptomatic, <50% confined to bed  There were no vitals filed for this visit. Wt Readings from Last 3 Encounters:  01/14/21 156 lb (70.8 kg)  01/14/21 151 lb 3.8 oz (68.6 kg)  12/31/20 156 lb (70.8 kg)    Physical Exam Vitals reviewed.  Constitutional:      Appearance: Normal appearance.  Cardiovascular:     Rate and Rhythm: Normal rate and regular rhythm.     Pulses: Normal pulses.     Heart sounds: Normal heart sounds.  Pulmonary:     Effort: Pulmonary effort is normal.     Breath sounds: Normal breath sounds.  Abdominal:     Palpations: Abdomen is  soft. There is no mass.     Tenderness: There is no abdominal tenderness.  Musculoskeletal:     Right lower leg: No edema.     Left lower leg: No edema.     Right foot: Swelling (1+ edema) present.     Left foot: Swelling (1+ edema) present.  Neurological:     General: No focal deficit present.     Mental Status: She is alert and oriented to person, place, and time.  Psychiatric:        Mood and Affect: Mood normal.        Behavior: Behavior normal.    LABORATORY DATA:  I have reviewed the labs as listed.  CBC Latest Ref Rng & Units 01/28/2021 01/14/2021 01/07/2021  WBC 4.0 - 10.5 K/uL 8.8 3.9(L) 10.8(H)  Hemoglobin 12.0 - 15.0 g/dL 12.5 12.7 13.1  Hematocrit 36.0 - 46.0 % 39.7 40.9 42.6  Platelets 150 - 400 K/uL 150 75(L) 299   CMP Latest Ref Rng & Units 01/28/2021 01/14/2021 01/07/2021  Glucose 70 - 99 mg/dL 121(H) 130(H) 138(H)  BUN 8 - 23 mg/dL 26(H) 26(H) 18  Creatinine 0.44 - 1.00 mg/dL 1.23(H) 1.09(H) 1.29(H)  Sodium 135 - 145 mmol/L 138 136 137  Potassium 3.5 - 5.1 mmol/L 4.1 4.7 3.9  Chloride 98 - 111 mmol/L 105 99 99  CO2 22 - 32 mmol/L '27 27 31  ' Calcium 8.9 - 10.3 mg/dL 8.5(L) 9.1 8.6(L)  Total Protein 6.5 - 8.1 g/dL 6.4(L) - 6.5  Total Bilirubin 0.3 - 1.2 mg/dL 0.5 - 0.6  Alkaline Phos 38 - 126 U/L 122 - 106  AST 15 - 41 U/L 23 - 24  ALT 0 - 44 U/L 18 - 14    DIAGNOSTIC IMAGING:  I have independently reviewed the scans and discussed with the patient. DG Chest Port 1 View  Result Date: 01/15/2021 CLINICAL DATA:  LEFT-SIDED PORTA CATHETER PLACEMENT. EXAM: PORTABLE CHEST 1 VIEW COMPARISON:  12/10/2020  FINDINGS: There is a left chest wall port a catheter with tip in the projection of the cavoatrial junction. No pneumothorax identified. Heart size is normal. Unchanged appearance of right pleural effusion. No interstitial edema or airspace consolidation. IMPRESSION: Left chest wall port a catheter with tip in the projection of the cavoatrial junction. No pneumothorax identified after port placement. Electronically Signed   By: Kerby Moors M.D.   On: 01/15/2021 13:30   DG C-Arm 1-60 Min-No Report  Result Date: 01/15/2021 Fluoroscopy was utilized by the requesting physician.  No radiographic interpretation.     ASSESSMENT:  1.  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 - She reported COVID infection in December 2020 and has been fatigued since then. - Chest x-ray on 06/21/2020 showed masslike opacity in the right lung. - CT chest with contrast on 06/28/2020 showed right upper lobe lung mass measuring 4 x 3.2 x 3.8 cm.  Small noncalcified mediastinal lymph nodes are all subcentimeter, nonspecific.  Small subpleural nodules in the right upper lobe measure up to 5 mm.  Clustered lingular nodules measuring up to 6 mm nonspecific.  Nonspecific 8 mm enhancing focus in the left lobe of the liver. - Right upper lobectomy and lymph node dissection on 08/15/2020. - Pathology consistent with 4.4 cm large cell neuroendocrine carcinoma, margins negative.  Lymph nodes at stations 7, 8, 9, 10, 11, 4R, 12 negative for metastatic disease.  No visceral pleural invasion  or direct invasion of the adjacent structures.  No lymphovascular invasion.  IHC positive for CD56, synaptophysin and TTF-1.  Chromogranin negative.  Ki-67 elevated. - PET scan on 10/01/2020 with postoperative changes in the chest wall.  Intense hyper metabolism in the acromion with SUV 8.6.  Hypermetabolic focus in the right gluteal region favoring injection site. -  Foundation 1 results showed TMB high, K-ras G12C mutation positive.  STK 11 positive - PD-L1 TPS 1%. - Right lateral pelvic soft tissue biopsy on 11/27/2020 consistent with metastatic neuroendocrine carcinoma, Ki-67 40-50%. - Based on K-ras G 12 C and STK 11 positivity, we have recommended treatment like non-small cell lung cancer. - Cycle 1 of carboplatin, paclitaxel and pembrolizumab on 12/17/2020.   2.  Social/family history: - She lives by herself at home and is independent of ADLs and IADLs. - She smoked 2 packs/day for 56 years and quit in May 2012. - She worked in [employer]. - Her nephew had colon cancer.  No other malignancies in the family.   PLAN:  1.  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  She reported improvement in pain. - She has some constant tingling in the bilateral second and third fingers since chemotherapy was started.  They are not hurting. - Reviewed labs which showed normal LFTs and CBC.  TSH is 7.3. - Creatinine is 1.23 and stable. - Recommend proceeding with cycle 3 today.  RTC 3 weeks with PET scan. - We will arrange for fluids next week.   2.  Shortness of breath on exertion: - Right thoracentesis on 10/07/2020 with 1.7 L fluid removed. - No worsening of breathing.  3.  Nausea: - Continue Reglan 5 mg as needed.  4.  Multiple subcutaneous nodules: - Left posterior hip skin nodule was treated with radiation.  No new nodules noted.  5.  Right shoulder pain: - She received radiation therapy.  Pain has improved. - She is requiring oxycodone once every 2 to 3 days.  6.  Diarrhea: - She does not report any diarrhea at this time.  Continue Lomotil as needed if recurs.  7.  Nutrition: - Continue 4 cans of Ensure daily.   Orders placed this encounter:  No orders of the defined types were placed in this encounter.    Derek Jack, MD [place] [phone]   I, Thana Ates, am acting as a scribe for Dr. Derek Jack.  I, Derek Jack MD, have reviewed the above documentation for accuracy and completeness, and I agree with the above.

## 2021-01-28 ENCOUNTER — Inpatient Hospital Stay (HOSPITAL_BASED_OUTPATIENT_CLINIC_OR_DEPARTMENT_OTHER): Payer: Medicare Other | Admitting: Hematology

## 2021-01-28 ENCOUNTER — Other Ambulatory Visit: Payer: Self-pay

## 2021-01-28 ENCOUNTER — Inpatient Hospital Stay (HOSPITAL_COMMUNITY): Payer: Medicare Other

## 2021-01-28 ENCOUNTER — Ambulatory Visit (INDEPENDENT_AMBULATORY_CARE_PROVIDER_SITE_OTHER): Payer: Medicare Other | Admitting: Pharmacist

## 2021-01-28 VITALS — BP 170/86 | HR 71 | Temp 96.9°F | Resp 18 | Wt 152.2 lb

## 2021-01-28 DIAGNOSIS — Z7901 Long term (current) use of anticoagulants: Secondary | ICD-10-CM

## 2021-01-28 DIAGNOSIS — C7A00 Malignant carcinoid tumor of unspecified site: Secondary | ICD-10-CM | POA: Diagnosis not present

## 2021-01-28 DIAGNOSIS — I4891 Unspecified atrial fibrillation: Secondary | ICD-10-CM | POA: Diagnosis not present

## 2021-01-28 DIAGNOSIS — Z79899 Other long term (current) drug therapy: Secondary | ICD-10-CM | POA: Diagnosis not present

## 2021-01-28 DIAGNOSIS — C7A090 Malignant carcinoid tumor of the bronchus and lung: Secondary | ICD-10-CM | POA: Diagnosis not present

## 2021-01-28 DIAGNOSIS — Z923 Personal history of irradiation: Secondary | ICD-10-CM | POA: Diagnosis not present

## 2021-01-28 DIAGNOSIS — Z87891 Personal history of nicotine dependence: Secondary | ICD-10-CM | POA: Diagnosis not present

## 2021-01-28 DIAGNOSIS — Z5111 Encounter for antineoplastic chemotherapy: Secondary | ICD-10-CM | POA: Diagnosis not present

## 2021-01-28 DIAGNOSIS — C3411 Malignant neoplasm of upper lobe, right bronchus or lung: Secondary | ICD-10-CM

## 2021-01-28 LAB — COMPREHENSIVE METABOLIC PANEL
ALT: 18 U/L (ref 0–44)
AST: 23 U/L (ref 15–41)
Albumin: 3.2 g/dL — ABNORMAL LOW (ref 3.5–5.0)
Alkaline Phosphatase: 122 U/L (ref 38–126)
Anion gap: 6 (ref 5–15)
BUN: 26 mg/dL — ABNORMAL HIGH (ref 8–23)
CO2: 27 mmol/L (ref 22–32)
Calcium: 8.5 mg/dL — ABNORMAL LOW (ref 8.9–10.3)
Chloride: 105 mmol/L (ref 98–111)
Creatinine, Ser: 1.23 mg/dL — ABNORMAL HIGH (ref 0.44–1.00)
GFR, Estimated: 45 mL/min — ABNORMAL LOW (ref >60)
Glucose, Bld: 121 mg/dL — ABNORMAL HIGH (ref 70–99)
Potassium: 4.1 mmol/L (ref 3.5–5.1)
Sodium: 138 mmol/L (ref 135–145)
Total Bilirubin: 0.5 mg/dL (ref 0.3–1.2)
Total Protein: 6.4 g/dL — ABNORMAL LOW (ref 6.5–8.1)

## 2021-01-28 LAB — CBC WITH DIFFERENTIAL/PLATELET
Abs Immature Granulocytes: 0.05 10*3/uL (ref 0.00–0.07)
Basophils Absolute: 0.1 10*3/uL (ref 0.0–0.1)
Basophils Relative: 1 %
Eosinophils Absolute: 0.2 10*3/uL (ref 0.0–0.5)
Eosinophils Relative: 2 %
HCT: 39.7 % (ref 36.0–46.0)
Hemoglobin: 12.5 g/dL (ref 12.0–15.0)
Immature Granulocytes: 1 %
Lymphocytes Relative: 7 %
Lymphs Abs: 0.6 10*3/uL — ABNORMAL LOW (ref 0.7–4.0)
MCH: 31 pg (ref 26.0–34.0)
MCHC: 31.5 g/dL (ref 30.0–36.0)
MCV: 98.5 fL (ref 80.0–100.0)
Monocytes Absolute: 0.5 10*3/uL (ref 0.1–1.0)
Monocytes Relative: 6 %
Neutro Abs: 7.3 10*3/uL (ref 1.7–7.7)
Neutrophils Relative %: 83 %
Platelets: 150 10*3/uL (ref 150–400)
RBC: 4.03 MIL/uL (ref 3.87–5.11)
RDW: 22.1 % — ABNORMAL HIGH (ref 11.5–15.5)
WBC: 8.8 10*3/uL (ref 4.0–10.5)
nRBC: 0 % (ref 0.0–0.2)

## 2021-01-28 LAB — TSH: TSH: 7.371 u[IU]/mL — ABNORMAL HIGH (ref 0.350–4.500)

## 2021-01-28 LAB — MAGNESIUM: Magnesium: 2 mg/dL (ref 1.7–2.4)

## 2021-01-28 MED ORDER — SODIUM CHLORIDE 0.9 % IV SOLN: Freq: Once | INTRAVENOUS | Status: AC

## 2021-01-28 MED ORDER — SODIUM CHLORIDE 0.9 % IV SOLN
150.0000 mg | Freq: Once | INTRAVENOUS | Status: AC
  Administered 2021-01-28: 150 mg via INTRAVENOUS
  Filled 2021-01-28: qty 5

## 2021-01-28 MED ORDER — SODIUM CHLORIDE 0.9 % IV SOLN
200.0000 mg | Freq: Once | INTRAVENOUS | Status: AC
  Administered 2021-01-28: 200 mg via INTRAVENOUS
  Filled 2021-01-28: qty 8

## 2021-01-28 MED ORDER — FAMOTIDINE IN NACL 20-0.9 MG/50ML-% IV SOLN
20.0000 mg | Freq: Once | INTRAVENOUS | Status: AC
  Administered 2021-01-28: 20 mg via INTRAVENOUS
  Filled 2021-01-28: qty 50

## 2021-01-28 MED ORDER — FAMOTIDINE 20 MG IN NS 100 ML IVPB: 20.0000 mg | Freq: Once | INTRAVENOUS | Status: DC

## 2021-01-28 MED ORDER — FUROSEMIDE 20 MG PO TABS: 20.0000 mg | ORAL_TABLET | Freq: Every day | ORAL | 1 refills | Status: DC

## 2021-01-28 MED ORDER — SODIUM CHLORIDE 0.9% FLUSH
10.0000 mL | INTRAVENOUS | Status: DC | PRN
  Administered 2021-01-28: 10 mL

## 2021-01-28 MED ORDER — SODIUM CHLORIDE 0.9 % IV SOLN
10.0000 mg | Freq: Once | INTRAVENOUS | Status: AC
  Administered 2021-01-28: 10 mg via INTRAVENOUS
  Filled 2021-01-28: qty 10

## 2021-01-28 MED ORDER — PALONOSETRON HCL INJECTION 0.25 MG/5ML
0.2500 mg | Freq: Once | INTRAVENOUS | Status: AC
  Administered 2021-01-28: 0.25 mg via INTRAVENOUS
  Filled 2021-01-28: qty 5

## 2021-01-28 MED ORDER — HEPARIN SOD (PORK) LOCK FLUSH 100 UNIT/ML IV SOLN
500.0000 [IU] | Freq: Once | INTRAVENOUS | Status: AC | PRN
  Administered 2021-01-28: 500 [IU]

## 2021-01-28 MED ORDER — SODIUM CHLORIDE 0.9 % IV SOLN
140.0000 mg/m2 | Freq: Once | INTRAVENOUS | Status: AC
  Administered 2021-01-28: 258 mg via INTRAVENOUS
  Filled 2021-01-28: qty 43

## 2021-01-28 MED ORDER — DIPHENHYDRAMINE HCL 50 MG/ML IJ SOLN
25.0000 mg | Freq: Once | INTRAMUSCULAR | Status: AC
  Administered 2021-01-28: 25 mg via INTRAVENOUS

## 2021-01-28 MED ORDER — SODIUM CHLORIDE 0.9 % IV SOLN
260.0000 mg | Freq: Once | INTRAVENOUS | Status: AC
  Administered 2021-01-28: 260 mg via INTRAVENOUS
  Filled 2021-01-28: qty 26

## 2021-01-28 NOTE — Progress Notes (Signed)
Patient presents today for Keytruda/Taxol/Carbo infusions per providers order.  Vital sgins within parameters for treatment.  Labs pending.  Patient has no new complaints at this time.  Labs within parameters.  Message received from Anastasio Champion RN/Dr. Delton Coombes, Patient okay for treatment.  Keytrda/Taxol/Carbo infusions given today per MD orders.  Stable during infusion without adverse affects.  Vital signs stable.  No complaints at this time.  Discharge from clinic via wheelchair in stable condition.  Alert and oriented X 3.  Follow up with [facility] as scheduled.

## 2021-01-28 NOTE — Patient Instructions (Signed)
[facility] at [facility] Discharge Instructions  You were seen and examined today by Dr. Delton Coombes. We will proceed with your treatment today. We will bring you in next week for IV hydration. Return as scheduled for lab work, office visit, and treatment.     Thank you for choosing [place] at [facility] to provide your oncology and hematology care.  To afford each patient quality time with our provider, please arrive at least 15 minutes before your scheduled appointment time.   If you have a lab appointment with the [facility] please come in thru the Main Entrance and check in at the main information desk.  You need to re-schedule your appointment should you arrive 10 or more minutes late.  We strive to give you quality time with our providers, and arriving late affects you and other patients whose appointments are after yours.  Also, if you no show three or more times for appointments you may be dismissed from the clinic at the providers discretion.     Again, thank you for choosing [facility].  Our hope is that these requests will decrease the amount of time that you wait before being seen by our physicians.       _____________________________________________________________  Should you have questions after your visit to [facility], please contact our office at [phone] and follow the prompts.  Our office hours are 8:00 a.m. and 4:30 p.m. Monday - Friday.  Please note that voicemails left after 4:00 p.m. may not be returned until the following business day.  We are closed weekends and major holidays.  You do have access to a nurse 24-7, just call the main number to the clinic [phone] and do not press any options, hold on the line and a nurse will answer the phone.    For prescription refill requests, have your pharmacy contact our office and allow 72 hours.    Due to Covid, you will need to wear a  mask upon entering the hospital. If you do not have a mask, a mask will be given to you at the Main Entrance upon arrival. For doctor visits, patients may have 1 support person age 33 or older with them. For treatment visits, patients can not have anyone with them due to social distancing guidelines and our immunocompromised population.

## 2021-01-28 NOTE — Progress Notes (Signed)
Chaplain engaged in an initial visit with Katelyn [name].  Katelyn [name] shared about being from [place] and moving to [place] when she got married.  She currently lives with her step daughter because her husband has since passed.  She loves the holiday season and decorating and voiced that she misses being able to go to [facility] to shop.  She expressed that she cannot walk very well and needs someone to be able to push her wheelchair for her.  Katelyn [name] also talked about her community and neighborhood.  She has been in her home since the 43's and has been able to see its many transitions over the years. She has seen a number of her neighbors pass on.   Chaplain offered listening, presence and support.    01/28/21 1100  Clinical Encounter Type  Visited With Patient  Visit Type Initial;Spiritual support

## 2021-01-28 NOTE — Patient Instructions (Signed)
Description   Called and spoke to pt and instructed her to continue taking warfarin 1 tablet (2.5mg ) daily. Recheck INR in 1 week. Coumadin Clinic [phone]

## 2021-01-28 NOTE — Patient Instructions (Signed)
[facility]  Discharge Instructions: Thank you for choosing [facility] to provide your oncology and hematology care.  If you have a lab appointment with the [facility], please come in thru the Main Entrance and check in at the main information desk.  Wear comfortable clothing and clothing appropriate for easy access to any Portacath or PICC line.   We strive to give you quality time with your provider. You may need to reschedule your appointment if you arrive late (15 or more minutes).  Arriving late affects you and other patients whose appointments are after yours.  Also, if you miss three or more appointments without notifying the office, you may be dismissed from the clinic at the provider's discretion.      For prescription refill requests, have your pharmacy contact our office and allow 72 hours for refills to be completed.    Today you received the following chemotherapy and/or immunotherapy agents Keytruda/Taxol/Carbo      To help prevent nausea and vomiting after your treatment, we encourage you to take your nausea medication as directed.  BELOW ARE SYMPTOMS THAT SHOULD BE REPORTED IMMEDIATELY: *FEVER GREATER THAN 100.4 F (38 C) OR HIGHER *CHILLS OR SWEATING *NAUSEA AND VOMITING THAT IS NOT CONTROLLED WITH YOUR NAUSEA MEDICATION *UNUSUAL SHORTNESS OF BREATH *UNUSUAL BRUISING OR BLEEDING *URINARY PROBLEMS (pain or burning when urinating, or frequent urination) *BOWEL PROBLEMS (unusual diarrhea, constipation, pain near the anus) TENDERNESS IN MOUTH AND THROAT WITH OR WITHOUT PRESENCE OF ULCERS (sore throat, sores in mouth, or a toothache) UNUSUAL RASH, SWELLING OR PAIN  UNUSUAL VAGINAL DISCHARGE OR ITCHING   Items with * indicate a potential emergency and should be followed up as soon as possible or go to the Emergency Department if any problems should occur.  Please show the CHEMOTHERAPY ALERT CARD or IMMUNOTHERAPY ALERT CARD at check-in to the  Emergency Department and triage nurse.  Should you have questions after your visit or need to cancel or reschedule your appointment, please contact [facility] [phone]  and follow the prompts.  Office hours are 8:00 a.m. to 4:30 p.m. Monday - Friday. Please note that voicemails left after 4:00 p.m. may not be returned until the following business day.  We are closed weekends and major holidays. You have access to a nurse at all times for urgent questions. Please call the main number to the clinic [phone] and follow the prompts.  For any non-urgent questions, you may also contact your provider using MyChart. We now offer e-Visits for anyone 35 and older to request care online for non-urgent symptoms. For details visit [email or web address].   Also download the MyChart app! Go to the app store, search "MyChart", open the app, select [facility], and log in with your MyChart username and password.  Due to Covid, a mask is required upon entering the hospital/clinic. If you do not have a mask, one will be given to you upon arrival. For doctor visits, patients may have 1 support person aged 50 or older with them. For treatment visits, patients cannot have anyone with them due to current Covid guidelines and our immunocompromised population.

## 2021-01-28 NOTE — Progress Notes (Signed)
Patient has been examined, vital signs and labs have been reviewed by Dr. Delton Coombes. ANC, Creatinine, LFTs, hemoglobin, and platelets are within treatment parameters per Dr. Delton Coombes. Patient may proceed with treatment per M.D.

## 2021-01-30 ENCOUNTER — Other Ambulatory Visit: Payer: Self-pay

## 2021-01-30 ENCOUNTER — Inpatient Hospital Stay (HOSPITAL_COMMUNITY): Payer: Medicare Other | Attending: Hematology

## 2021-01-30 VITALS — BP 100/60 | HR 59 | Temp 97.6°F | Resp 18

## 2021-01-30 DIAGNOSIS — C7A090 Malignant carcinoid tumor of the bronchus and lung: Secondary | ICD-10-CM | POA: Insufficient documentation

## 2021-01-30 DIAGNOSIS — Z5111 Encounter for antineoplastic chemotherapy: Secondary | ICD-10-CM | POA: Insufficient documentation

## 2021-01-30 DIAGNOSIS — Z79899 Other long term (current) drug therapy: Secondary | ICD-10-CM | POA: Diagnosis not present

## 2021-01-30 DIAGNOSIS — E039 Hypothyroidism, unspecified: Secondary | ICD-10-CM | POA: Insufficient documentation

## 2021-01-30 DIAGNOSIS — C7A00 Malignant carcinoid tumor of unspecified site: Secondary | ICD-10-CM | POA: Diagnosis not present

## 2021-01-30 DIAGNOSIS — Z87891 Personal history of nicotine dependence: Secondary | ICD-10-CM | POA: Diagnosis not present

## 2021-01-30 DIAGNOSIS — R110 Nausea: Secondary | ICD-10-CM | POA: Diagnosis not present

## 2021-01-30 DIAGNOSIS — C3411 Malignant neoplasm of upper lobe, right bronchus or lung: Secondary | ICD-10-CM

## 2021-01-30 MED ORDER — PEGFILGRASTIM-CBQV 6 MG/0.6ML ~~LOC~~ SOSY
6.0000 mg | PREFILLED_SYRINGE | Freq: Once | SUBCUTANEOUS | Status: AC
  Administered 2021-01-30: 6 mg via SUBCUTANEOUS
  Filled 2021-01-30: qty 0.6

## 2021-01-30 NOTE — Patient Instructions (Signed)
[facility]  Discharge Instructions: Thank you for choosing [facility] to provide your oncology and hematology care.  If you have a lab appointment with the Cancer Center, please come in thru the Main Entrance and check in at the main information desk.  Wear comfortable clothing and clothing appropriate for easy access to any Portacath or PICC line.   We strive to give you quality time with your provider. You may need to reschedule your appointment if you arrive late (15 or more minutes).  Arriving late affects you and other patients whose appointments are after yours.  Also, if you miss three or more appointments without notifying the office, you may be dismissed from the clinic at the provider's discretion.      For prescription refill requests, have your pharmacy contact our office and allow 72 hours for refills to be completed.        To help prevent nausea and vomiting after your treatment, we encourage you to take your nausea medication as directed.  BELOW ARE SYMPTOMS THAT SHOULD BE REPORTED IMMEDIATELY: *FEVER GREATER THAN 100.4 F (38 C) OR HIGHER *CHILLS OR SWEATING *NAUSEA AND VOMITING THAT IS NOT CONTROLLED WITH YOUR NAUSEA MEDICATION *UNUSUAL SHORTNESS OF BREATH *UNUSUAL BRUISING OR BLEEDING *URINARY PROBLEMS (pain or burning when urinating, or frequent urination) *BOWEL PROBLEMS (unusual diarrhea, constipation, pain near the anus) TENDERNESS IN MOUTH AND THROAT WITH OR WITHOUT PRESENCE OF ULCERS (sore throat, sores in mouth, or a toothache) UNUSUAL RASH, SWELLING OR PAIN  UNUSUAL VAGINAL DISCHARGE OR ITCHING   Items with * indicate a potential emergency and should be followed up as soon as possible or go to the Emergency Department if any problems should occur.  Please show the CHEMOTHERAPY ALERT CARD or IMMUNOTHERAPY ALERT CARD at check-in to the Emergency Department and triage nurse.  Should you have questions after your visit or need to cancel  or reschedule your appointment, please contact [facility] [phone]  and follow the prompts.  Office hours are 8:00 a.m. to 4:30 p.m. Monday - Friday. Please note that voicemails left after 4:00 p.m. may not be returned until the following business day.  We are closed weekends and major holidays. You have access to a nurse at all times for urgent questions. Please call the main number to the clinic [phone] and follow the prompts.  For any non-urgent questions, you may also contact your provider using MyChart. We now offer e-Visits for anyone 18 and older to request care online for non-urgent symptoms. For details visit [email or web address].   Also download the MyChart app! Go to the app store, search "MyChart", open the app, select [facility], and log in with your MyChart username and password.  Due to Covid, a mask is required upon entering the hospital/clinic. If you do not have a mask, one will be given to you upon arrival. For doctor visits, patients may have 1 support person aged 18 or older with them. For treatment visits, patients cannot have anyone with them due to current Covid guidelines and our immunocompromised population.  

## 2021-01-30 NOTE — Progress Notes (Signed)
Patient presents today for Udenyca injection.  Patient is in satisfactory condition with no complaints voiced.  Vital signs are stable.  We will proceed with treatment per MD orders.   Patient tolerated injection with no complaints voiced.  Site clean and dry with no bruising or swelling noted.  No complaints of pain.  Discharged with vital signs stable and no signs or symptoms of distress noted.

## 2021-02-04 LAB — POCT INR: INR: 2.5 (ref 2.0–3.0)

## 2021-02-05 ENCOUNTER — Ambulatory Visit (INDEPENDENT_AMBULATORY_CARE_PROVIDER_SITE_OTHER): Payer: Medicare Other | Admitting: Cardiovascular Disease

## 2021-02-05 DIAGNOSIS — Z7901 Long term (current) use of anticoagulants: Secondary | ICD-10-CM | POA: Diagnosis not present

## 2021-02-06 ENCOUNTER — Inpatient Hospital Stay (HOSPITAL_COMMUNITY): Payer: Medicare Other

## 2021-02-06 ENCOUNTER — Other Ambulatory Visit: Payer: Self-pay

## 2021-02-06 DIAGNOSIS — C7A00 Malignant carcinoid tumor of unspecified site: Secondary | ICD-10-CM | POA: Diagnosis not present

## 2021-02-06 DIAGNOSIS — C7A090 Malignant carcinoid tumor of the bronchus and lung: Secondary | ICD-10-CM | POA: Diagnosis not present

## 2021-02-06 DIAGNOSIS — Z87891 Personal history of nicotine dependence: Secondary | ICD-10-CM | POA: Diagnosis not present

## 2021-02-06 DIAGNOSIS — R110 Nausea: Secondary | ICD-10-CM | POA: Diagnosis not present

## 2021-02-06 DIAGNOSIS — Z5111 Encounter for antineoplastic chemotherapy: Secondary | ICD-10-CM | POA: Diagnosis not present

## 2021-02-06 DIAGNOSIS — E039 Hypothyroidism, unspecified: Secondary | ICD-10-CM | POA: Diagnosis not present

## 2021-02-06 MED ORDER — HEPARIN SOD (PORK) LOCK FLUSH 100 UNIT/ML IV SOLN
500.0000 [IU] | Freq: Once | INTRAVENOUS | Status: AC
  Administered 2021-02-06: 500 [IU] via INTRAVENOUS

## 2021-02-06 MED ORDER — SODIUM CHLORIDE 0.9% FLUSH
10.0000 mL | INTRAVENOUS | Status: DC | PRN
  Administered 2021-02-06: 10 mL via INTRAVENOUS

## 2021-02-06 MED ORDER — SODIUM CHLORIDE 0.9 % IV SOLN: INTRAVENOUS | Status: AC

## 2021-02-06 NOTE — Progress Notes (Signed)
Pt presents today for 1 liter of NS over 2 hours per provider's order.Vital signs stable and pt voiced no new complaints at this time.  1 liter of NS given today per MD orders. Tolerated infusion without adverse affects. Vital signs stable. No complaints at this time. Discharged from clinic via wheelchair in stable condition. Alert and oriented x 3. F/U with [facility] as scheduled.

## 2021-02-11 ENCOUNTER — Ambulatory Visit (INDEPENDENT_AMBULATORY_CARE_PROVIDER_SITE_OTHER): Payer: Medicare Other | Admitting: Cardiology

## 2021-02-11 DIAGNOSIS — Z5181 Encounter for therapeutic drug level monitoring: Secondary | ICD-10-CM | POA: Diagnosis not present

## 2021-02-11 DIAGNOSIS — Z7901 Long term (current) use of anticoagulants: Secondary | ICD-10-CM

## 2021-02-11 LAB — POCT INR: INR: 2.7 (ref 2.0–3.0)

## 2021-02-13 ENCOUNTER — Other Ambulatory Visit: Payer: Self-pay

## 2021-02-13 ENCOUNTER — Encounter (HOSPITAL_COMMUNITY)
Admission: RE | Admit: 2021-02-13 | Discharge: 2021-02-13 | Disposition: A | Discharge status: Home / Self Care | Payer: Medicare Other | Source: Ambulatory Visit | Attending: Hematology | Admitting: Hematology

## 2021-02-13 DIAGNOSIS — I2510 Atherosclerotic heart disease of native coronary artery without angina pectoris: Secondary | ICD-10-CM | POA: Diagnosis not present

## 2021-02-13 DIAGNOSIS — I10 Essential (primary) hypertension: Secondary | ICD-10-CM | POA: Diagnosis not present

## 2021-02-13 DIAGNOSIS — J90 Pleural effusion, not elsewhere classified: Secondary | ICD-10-CM | POA: Diagnosis not present

## 2021-02-13 DIAGNOSIS — C3490 Malignant neoplasm of unspecified part of unspecified bronchus or lung: Secondary | ICD-10-CM | POA: Diagnosis not present

## 2021-02-13 DIAGNOSIS — E039 Hypothyroidism, unspecified: Secondary | ICD-10-CM | POA: Diagnosis not present

## 2021-02-13 DIAGNOSIS — C3411 Malignant neoplasm of upper lobe, right bronchus or lung: Secondary | ICD-10-CM | POA: Diagnosis not present

## 2021-02-13 DIAGNOSIS — J439 Emphysema, unspecified: Secondary | ICD-10-CM | POA: Diagnosis not present

## 2021-02-13 DIAGNOSIS — E559 Vitamin D deficiency, unspecified: Secondary | ICD-10-CM | POA: Diagnosis not present

## 2021-02-13 MED ORDER — FLUDEOXYGLUCOSE F - 18 (FDG) INJECTION
8.1410 | Freq: Once | INTRAVENOUS | Status: AC | PRN
  Administered 2021-02-13: 8.141 via INTRAVENOUS

## 2021-02-15 ENCOUNTER — Other Ambulatory Visit: Payer: Self-pay

## 2021-02-17 DIAGNOSIS — I739 Peripheral vascular disease, unspecified: Secondary | ICD-10-CM | POA: Diagnosis not present

## 2021-02-17 DIAGNOSIS — I10 Essential (primary) hypertension: Secondary | ICD-10-CM | POA: Diagnosis not present

## 2021-02-17 DIAGNOSIS — E785 Hyperlipidemia, unspecified: Secondary | ICD-10-CM | POA: Diagnosis not present

## 2021-02-17 DIAGNOSIS — Z23 Encounter for immunization: Secondary | ICD-10-CM | POA: Diagnosis not present

## 2021-02-17 DIAGNOSIS — C3491 Malignant neoplasm of unspecified part of right bronchus or lung: Secondary | ICD-10-CM | POA: Diagnosis not present

## 2021-02-17 DIAGNOSIS — C44310 Basal cell carcinoma of skin of unspecified parts of face: Secondary | ICD-10-CM | POA: Diagnosis not present

## 2021-02-17 DIAGNOSIS — Z8616 Personal history of COVID-19: Secondary | ICD-10-CM | POA: Diagnosis not present

## 2021-02-17 DIAGNOSIS — K219 Gastro-esophageal reflux disease without esophagitis: Secondary | ICD-10-CM | POA: Diagnosis not present

## 2021-02-17 DIAGNOSIS — Z87891 Personal history of nicotine dependence: Secondary | ICD-10-CM | POA: Diagnosis not present

## 2021-02-17 DIAGNOSIS — I480 Paroxysmal atrial fibrillation: Secondary | ICD-10-CM | POA: Diagnosis not present

## 2021-02-17 DIAGNOSIS — N189 Chronic kidney disease, unspecified: Secondary | ICD-10-CM

## 2021-02-17 DIAGNOSIS — I2510 Atherosclerotic heart disease of native coronary artery without angina pectoris: Secondary | ICD-10-CM | POA: Diagnosis not present

## 2021-02-17 DIAGNOSIS — E039 Hypothyroidism, unspecified: Secondary | ICD-10-CM | POA: Diagnosis not present

## 2021-02-17 HISTORY — DX: Chronic kidney disease, unspecified: N18.9

## 2021-02-17 NOTE — Progress Notes (Signed)
[place], [place] [address]   CLINIC:  Medical Oncology/Hematology  PCP:  Celene Squibb, MD [address] [name] [address] [phone]   REASON FOR VISIT:  Follow-up for right lung cancer  PRIOR THERAPY: Right upper lobectomy (08/15/20)  NGS Results: not done  CURRENT THERAPY: Carboplatin, Taxol, and Keytruda every 3 weeks x 4 cycles; to be followed by Beryle Flock maintenance every 3 weeks  BRIEF ONCOLOGIC HISTORY:  Oncology History Overview Note  Foundation One PD-L1 TPS score 1 16 mut/MB [id number] mutation MSI stable   Cancer of upper lobe of right lung ([place])  08/15/2020 Pathology Results   FINAL MICROSCOPIC DIAGNOSIS:   A. 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  - Tumor is limited to lung.  - Margins are negative for carcinoma.  - One of one lymph nodes negative for carcinoma (0/1).  - See oncology table.   B. LYMPH NODE, 7, EXCISION:  - One of one lymph nodes negative for carcinoma (0/1).   C. LYMPH NODE, 7 #2, EXCISION:  - One of one lymph nodes negative for carcinoma (0/1).   D. LYMPH NODE, 9, EXCISION:  - One of one lymph nodes negative for carcinoma (0/1).   E. LYMPH NODE, 8, EXCISION:  - One of one lymph nodes negative for carcinoma (0/1).   F. LYMPH NODE, 7 #3, EXCISION:  - One of one lymph nodes negative for carcinoma (0/1).   G. LYMPH NODE, 11, EXCISION:  - One of one lymph nodes negative for carcinoma (0/1).   H. LYMPH NODE, 10, EXCISION:  - One of one lymph nodes negative for carcinoma (0/1).   I. LYMPH NODE, 4R, EXCISION:  - One of one lymph nodes negative for carcinoma (0/1).   J. LYMPH NODE, 10 #2, EXCISION:   - One of one lymph nodes negative for carcinoma (0/1).  - Fibrosis and calcifications.   K. LYMPH NODE, 12, EXCISION:  - One of one lymph nodes negative for carcinoma (0/1).   L. LYMPH NODE, 12 #2, EXCISION:  - One of one  lymph nodes negative for carcinoma (0/1).   ONCOLOGY TABLE:   LUNG: Resection   Synchronous Tumors: Not applicable  Total Number of Primary Tumors: 1  Procedure: Right upper lobe lobectomy with lymph node biopsies  Specimen Laterality: Right  Tumor Focality: Unifocal  Tumor Site: Right upper lobe  Tumor Size: 4.4 cm  Histologic Type: Large cell neuroendocrine carcinoma  Visceral Pleura Invasion: Not identified  Direct Invasion of Adjacent Structures: No adjacent structures present  Lymphovascular Invasion: Not identified  Margins: All margins negative for invasive carcinoma       Closest Margin(s) to Invasive Carcinoma: 1.5 cm to bronchial margin  Treatment Effect: No known presurgical therapy  Regional Lymph Nodes:       Number of Lymph Nodes Involved: 0                            Nodal Sites with Tumor: Not applicable       Number of Lymph Nodes Examined: 12                       Nodal Sites Examined: 4, 7, 8, 9, 10, 11, 12  Distant Metastasis:       Distant Site(s) Involved: Not applicable  Pathologic Stage Classification (pTNM, AJCC 8th  Edition): pT2b, pN0  Ancillary Studies: Can be performed upon request  Representative Tumor Block: A2  Comment(s): The tumor has a subtle neuroendocrine features, cribriform areas with necrosis, and geographic areas of necrosis.  Immunohistochemistry is positive for CD56, synaptophysin, and TTF-1.  Chromogranin is largely negative. Ki-67 is elevated. Dr. Saralyn Pilar has reviewed the case.     08/15/2020 Surgery   PREOPERATIVE DIAGNOSIS:  Right upper lobe lung mass, suspected non-small cell carcinoma.   POSTOPERATIVE DIAGNOSIS:  Non-small cell carcinoma, right upper lobe, clinical stage IB (T2, N0).   PROCEDURE:   Xi robotic-assisted right upper lobectomy,  Lymph node dissection,  Intercostal nerve blocks levels 3 through 10.   SURGEON:  Modesto Charon, MD    FINDINGS:  Fragile tissue.  Enlarged but otherwise benign appearing nodes.   One calcified node.  Frozen section revealed non-small cell carcinoma.  Bronchial margin clear.   09/04/2020 Initial Diagnosis   Cancer of upper lobe of right lung ([facility])   09/04/2020 Cancer Staging   Staging form: Lung, AJCC 8th Edition - Pathologic stage from 09/04/2020: pT2, pN0, cM0 - Signed by Heath Lark, MD on 09/04/2020 Stage prefix: Initial diagnosis    09/11/2020 Genetic Testing   PD-L1 Results:     12/11/2020 Cancer Staging   Staging form: Lung, AJCC 8th Edition - Pathologic stage from 12/11/2020: Stage IVB (pT2b, pN0, pM1c) - Signed by Derek Jack, MD on 12/11/2020 Histopathologic type: Large cell neuroendocrine carcinoma    12/17/2020 -  Chemotherapy   Patient is on Treatment Plan : LUNG NSCLC Carboplatin + Paclitaxel + Pembrolizumab q21d x 4 cycles / Pembrolizumab Maintenance Q21D       CANCER STAGING:  Cancer Staging  Cancer of upper lobe of right lung [facility]) Staging form: Lung, AJCC 8th Edition - Pathologic stage from 09/04/2020: pT2, pN0, cM0 - Signed by Heath Lark, MD on 09/04/2020 - Pathologic stage from 12/11/2020: Stage IVB (pT2b, pN0, pM1c) - Signed by Derek Jack, MD on 12/11/2020   INTERVAL HISTORY:  Ms. VERNEDA HOLLOPETER, a 80 y.o. female, returns for routine follow-up and consideration for next cycle of chemotherapy. Jillyn was last seen on 01/28/2021.  Due for cycle #4 of Carboplatin, Taxol, and Keytruda today.   Overall, she tells me she has been feeling pretty well. She reports her right shoulder pain has resolved, and she has not needed to take oxycodone. She reports weakness in her legs. The numbness/tingling in her fingers is stable, and her fatigue is also stable. Her appetite is fair. She reports she is drinking about 48 ounces of water daily. She denies pain in her left hip. She takes 20 mg Lasix daily.   Overall, she feels ready for next cycle of chemo today.   REVIEW OF SYSTEMS:  Review of Systems  Constitutional:  Positive for  fatigue (40%; stable). Negative for appetite change (40%).  Respiratory:  Positive for shortness of breath.   Musculoskeletal:  Negative for arthralgias.  Neurological:  Positive for extremity weakness (legs) and numbness (stable).  All other systems reviewed and are negative.  PAST MEDICAL/SURGICAL HISTORY:  Past Medical History:  Diagnosis Date   Allergy    rhinitis   Aortic stenosis, severe    a. s/p TAVR 08/2014.   Arthritis    spine and various joints   Cancer of upper lobe of right lung ([place]) 09/04/2020   Carotid artery disease (HCC)    Cataract    Cerebrovascular disease 12/05/2018   Childhood asthma    Chronic bronchitis (  [place])        Claudication ([place])    COPD GOLD II 07/18/2020   Coronary artery disease    a. April 2012 which revealed mid RCA occlusion with collaterals, 50% LAD stenosis, 30% circumflex stenosis, and 40% marginal stenosis   First degree AV block    GERD (gastroesophageal reflux disease)    Heart murmur    Hyperlipidemia    Hypertension    Left bundle branch block    LUMBAR RADICULOPATHY, RIGHT    PAF (paroxysmal atrial fibrillation) (HCC)    Peripheral vascular disease ([place])    a. s/p aortobifem bypass graft surgery and bilateral fundoplasty by [facility].   Renal artery stenosis ([place])    a. renal artery stenosis (>60% L renal artery) by duplex 04/2020.   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 bilateral subclavian stenosis with antegrade flow by duplex 07/2020   Past Surgical History:  Procedure Laterality Date   AORTO-FEMORAL BYPASS GRAFT Bilateral 06/27/10   BASAL CELL CARCINOMA EXCISION Left    face   CARDIAC CATHETERIZATION N/A 07/25/2014   Procedure: Right/Left Heart Cath  and Coronary Angiography;  Surgeon: Troy Sine, MD;  Location: [facility];  Service: Cardiovascular;  Laterality: N/A;   CATARACT EXTRACTION, BILATERAL Bilateral 09/29/2016   COLONOSCOPY  2008   Dr. Laural Golden: normal.    DILATION AND CURETTAGE OF UTERUS  "2 or 3"   ESOPHAGOGASTRODUODENOSCOPY N/A 08/18/2017   Dr. Gala Romney: Normal-appearing esophagus status post dilation.  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  Small hiatal hernia.   INTERCOSTAL NERVE BLOCK Right 08/15/2020   Procedure: INTERCOSTAL NERVE BLOCK RIGHT;  Surgeon: Melrose Nakayama, MD;  Location: [facility];  Service: Thoracic;  Laterality: Right;   IR THORACENTESIS ASP PLEURAL SPACE W/IMG GUIDE  10/07/2020   MALONEY DILATION N/A 08/18/2017   Procedure: Venia Minks DILATION;  Surgeon: Daneil Dolin, MD;  [facility];  Service: Endoscopy;  Laterality: N/A;   MULTIPLE TOOTH EXTRACTIONS     NODE DISSECTION Right 08/15/2020   Procedure: NODE DISSECTION;  Surgeon: Melrose Nakayama, MD;  Location: [facility];  Service: Thoracic;  Laterality: Right;   PORTACATH PLACEMENT Left 01/15/2021   Procedure: INSERTION PORT-A-CATH;  Surgeon: Aviva Signs, MD;  Location: [facility];  Service: General;  Laterality: Left;   PR VEIN BYPASS GRAFT,AORTO-FEM-POP  06/12/10   TEE WITHOUT CARDIOVERSION N/A 08/31/2014   Procedure: TRANSESOPHAGEAL ECHOCARDIOGRAM (TEE);  Surgeon: Rexene Alberts, MD;  Location: [facility];  Service: Open Heart Surgery;  Laterality: N/A;   TRANSCATHETER AORTIC VALVE REPLACEMENT, TRANSFEMORAL N/A 08/31/2014   Procedure: TRANSCATHETER AORTIC VALVE REPLACEMENT, TRANSFEMORAL approach;  Surgeon: Rexene Alberts, MD;  Location: [facility];  Service: Open Heart Surgery;  Laterality: N/A;   TUBAL LIGATION  1970's   VAGINAL HYSTERECTOMY  1970's   Partial     SOCIAL HISTORY:  Social History   Socioeconomic History   Marital status: Widowed    Spouse name: bobby   Number of children: 1   Years of education: Not on file   Highest  education level: High school graduate  Occupational History   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 Retired,used to work at [employer].Lives alone.         Patient has one daughter, 3 grands   Likes to crochet   Works in [employer].    Social Determinants of Health   Financial Resource Strain: Not on file  Food Insecurity: No Food Insecurity   Worried About Charity fundraiser in the Last Year: Never true   Ran Out of Food in the Last Year: Never true  Transportation Needs: No Transportation Needs   Lack of Transportation (Medical): No   Lack of Transportation (Non-Medical): No  Physical Activity: Not on file  Stress: Not on file  Social Connections: Not on file  Intimate Partner Violence: Not on file    FAMILY HISTORY:  Family History  Problem Relation Age of Onset   Heart disease Mother 12       died young   Varicose Veins Mother    Stroke Father    Hypertension Father    Heart disease Father 40       Aneurysm   Heart attack Father    Arthritis Brother    Stroke Brother    Diabetes Daughter    Arthritis Daughter    Heart attack Brother        unsure   Allergic rhinitis Neg Hx    Angioedema Neg Hx    Asthma Neg Hx    Atopy Neg Hx    Eczema Neg Hx    Immunodeficiency Neg Hx    Urticaria Neg Hx     CURRENT MEDICATIONS:  Current Outpatient Medications  Medication Sig Dispense Refill   amiodarone (PACERONE) 200 MG tablet Take 1 tablet by mouth once daily 90 tablet 0    CARBOPLATIN IV Inject into the vein every 21 ( twenty-one) days.     diphenoxylate-atropine (LOMOTIL) 2.5-0.025 MG tablet Take 2 pills at the onset of diarrhea, and then 1 tablet after each episode of diarrhea. 60 tablet 3   estradiol (ESTRACE) 2 MG tablet Take 1 tablet (2 mg total) by mouth daily. 90 tablet 0   folic acid (FOLVITE) 1 MG tablet folic acid 1 mg tablet  TAKE 1 TABLET BY MOUTH ONCE DAILY     furosemide (LASIX) 20 MG tablet Take 1 tablet (20 mg total) by mouth daily. 30 tablet 1   irbesartan (AVAPRO) 300 MG tablet Take 300 mg by mouth daily.     levothyroxine (SYNTHROID) 25 MCG tablet Take 1 tablet (25 mcg total) by mouth daily before breakfast. 30 tablet 2   lidocaine-prilocaine (EMLA) cream Apply a small amount to port a cath site and cover with plastic wrap 1 hour prior to infusion appointments 30 g 3   meloxicam (MOBIC) 15 MG tablet Take 1 tablet by mouth once daily (Patient taking differently: Take 15 mg by mouth daily as needed for pain.) 30 tablet 3   metoCLOPramide (REGLAN)  5 MG tablet Take 2 tablets (10 mg total) by mouth 3 (three) times daily before meals. 30 tablet 3   metoprolol succinate (TOPROL-XL) 25 MG 24 hr tablet Take 1 tablet by mouth once daily 90 tablet 2   OVER THE COUNTER MEDICATION Apply 1 application topically every other day. Lantispetic cream     PACLITAXEL IV Inject into the vein every 21 ( twenty-one) days.     pantoprazole (PROTONIX) 40 MG tablet TAKE 1 TABLET BY MOUTH ONCE DAILY BEFORE SUPPER (Patient taking differently: Take 40 mg by mouth daily.) 90 tablet 3   Pembrolizumab (KEYTRUDA IV) Inject into the vein every 21 ( twenty-one) days.     potassium chloride (KLOR-CON) 10 MEQ tablet Take 1 tablet (10 mEq total) by mouth daily. 14 tablet 1   rosuvastatin (CRESTOR) 20 MG tablet Take 1 tablet (20 mg total) by mouth at bedtime. 90 tablet 0   Specialty Vitamins Products (MAGNESIUM, AMINO ACID CHELATE,) 133 MG tablet Take 1 tablet by mouth 2 (two) times  daily. 60 tablet 3   warfarin (COUMADIN) 2.5 MG tablet Take 1 tablet (2.5 mg total) by mouth daily. 90 tablet 0   acetaminophen (TYLENOL) 500 MG tablet Take 500 mg by mouth every 6 (six) hours as needed for moderate pain. (Patient not taking: Reported on 02/18/2021)     diphenhydrAMINE (BENADRYL) 25 MG tablet Take 25 mg by mouth every 6 (six) hours as needed for allergies. (Patient not taking: Reported on 02/18/2021)     ipratropium (ATROVENT) 0.06 % nasal spray USE 1 SPRAY(S) IN EACH NOSTRIL EVERY 6 HOURS AS NEEDED FOR RUNNY NOSE (Patient not taking: Reported on 02/18/2021) 15 mL 2   Misc. Devices MISC 5-10 mLs by Mouth Rinse route every 4 (four) hours as needed (Swish and spit). Please provide patient with 1:1 Magic Mouthwash and Viscous Lidocaine (Patient not taking: Reported on 02/18/2021) 120 mL 1   nitroGLYCERIN (NITROSTAT) 0.4 MG SL tablet Place 0.4 mg under the tongue every 5 (five) minutes as needed for chest pain. (Patient not taking: Reported on 02/18/2021)     oxyCODONE (OXY IR/ROXICODONE) 5 MG immediate release tablet Take 1 tablet (5 mg total) by mouth at bedtime as needed for severe pain. (Patient not taking: Reported on 02/18/2021) 30 tablet 0   prochlorperazine (COMPAZINE) 10 MG tablet Take 1 tablet (10 mg total) by mouth every 6 (six) hours as needed (Nausea or vomiting). (Patient not taking: Reported on 02/18/2021) 30 tablet 1   No current facility-administered medications for this visit.    ALLERGIES:  Allergies  Allergen Reactions   Adhesive [Tape] Rash   Latex Rash   Tetanus Toxoids Rash   Wound Dressing Adhesive Rash    PHYSICAL EXAM:  Performance status (ECOG): 2 - Symptomatic, <50% confined to bed  Vitals:   02/18/21 0907  Pulse: 63  Resp: 17  Temp: (!) 97.1 F (36.2 C)  SpO2: 97%   Wt Readings from Last 3 Encounters:  02/18/21 150 lb 8 oz (68.3 kg)  01/28/21 152 lb 3.2 oz (69 kg)  01/14/21 156 lb (70.8 kg)   Physical Exam Vitals reviewed.   Constitutional:      Appearance: Normal appearance.     Comments: In wheelchair  Cardiovascular:     Rate and Rhythm: Normal rate and regular rhythm.     Pulses: Normal pulses.     Heart sounds: Normal heart sounds.  Pulmonary:     Effort: Pulmonary effort is normal.  Breath sounds: Examination of the right-lower field reveals decreased breath sounds. Decreased breath sounds present.  Musculoskeletal:     Right lower leg: No edema.     Left lower leg: No edema.  Neurological:     General: No focal deficit present.     Mental Status: She is alert and oriented to person, place, and time.  Psychiatric:        Mood and Affect: Mood normal.        Behavior: Behavior normal.    LABORATORY DATA:  I have reviewed the labs as listed.  CBC Latest Ref Rng & Units 02/18/2021 01/28/2021 01/14/2021  WBC 4.0 - 10.5 K/uL 9.2 8.8 3.9(L)  Hemoglobin 12.0 - 15.0 g/dL 12.9 12.5 12.7  Hematocrit 36.0 - 46.0 % 40.3 39.7 40.9  Platelets 150 - 400 K/uL 120(L) 150 75(L)   CMP Latest Ref Rng & Units 02/18/2021 01/28/2021 01/14/2021  Glucose 70 - 99 mg/dL 115(H) 121(H) 130(H)  BUN 8 - 23 mg/dL 27(H) 26(H) 26(H)  Creatinine 0.44 - 1.00 mg/dL 1.49(H) 1.23(H) 1.09(H)  Sodium 135 - 145 mmol/L 141 138 136  Potassium 3.5 - 5.1 mmol/L 4.5 4.1 4.7  Chloride 98 - 111 mmol/L 102 105 99  CO2 22 - 32 mmol/L $RemoveB'29 27 27  '[id number]$ Calcium 8.9 - 10.3 mg/dL 8.9 8.5(L) 9.1  Total Protein 6.5 - 8.1 g/dL 6.9 6.4(L) -  Total Bilirubin 0.3 - 1.2 mg/dL 0.4 0.5 -  Alkaline Phos 38 - 126 U/L 125 122 -  AST 15 - 41 U/L 31 23 -  ALT 0 - 44 U/L 21 18 -    DIAGNOSTIC IMAGING:  I have independently reviewed the scans and discussed with the patient. NM PET Image Restag (PS) Skull Base To Thigh  Result Date: 02/14/2021 CLINICAL DATA:  Subsequent treatment strategy for non-small cell lung cancer. EXAM: NUCLEAR MEDICINE PET SKULL BASE TO THIGH TECHNIQUE: 8.14 mCi F-18 FDG was injected intravenously. Full-ring PET imaging was  performed from the skull base to thigh after the radiotracer. CT data was obtained and used for attenuation correction and anatomic localization. Fasting blood glucose: 107 mg/dl COMPARISON:  CTs of the chest, abdomen and pelvis 12/16/2020. PET-CT 07/23/2020 and 10/01/2020. right shoulder MRI 11/11/2020. FINDINGS: Mediastinal blood pool activity: SUV max 2.0 NECK: No hypermetabolic cervical lymph nodes are identified.There are no lesions of the pharyngeal mucosal space. Incidental CT findings: Extensive bilateral carotid atherosclerosis. CHEST: There are no hypermetabolic mediastinal, hilar or axillary lymph nodes. No hypermetabolic pulmonary activity or suspicious nodularity. Incidental CT findings: Dependent right pleural effusion has mildly enlarged compared with previous PET-CT, although demonstrates no associated hypermetabolic activity. There is extensive atherosclerosis of the aorta, great vessels and coronary arteries status post TAVR. Left subclavian Port-A-Cath extends to the mid SVC. Small calcified mediastinal lymph nodes are stable. Patient is status post right upper lobectomy. There are underlying emphysematous changes with scattered parenchymal scarring. Clustered nodularity in the lingula is stable, without hypermetabolic activity. ABDOMEN/PELVIS: There is no hypermetabolic activity within the liver, adrenal glands, spleen or pancreas. There is no hypermetabolic nodal activity. Incidental CT findings: Low level hypermetabolic activity associated with the aorto bi-iliac graft, improved from previous PET-CT. Underlying diffuse aortic and branch vessel atherosclerosis with stable chronic postsurgical fluid collection in the left groin. Small calcified gallstones are noted. SKELETON: There is no hypermetabolic activity to suggest osseous metastatic disease. Interval development of low-level diffuse metabolic activity throughout the bones, likely treatment related. The focal hypermetabolic lesion  previously demonstrated within the  right supraspinous notch has resolved. A subcutaneous nodule in the right buttock has decreased in size compared with the most recent CT, currently measuring 2.5 x 1.6 cm (previously 3.0 x 2.5 cm). This demonstrates low level metabolic activity (SUV max 1.9, previously 10.7). Left perirectal soft tissue nodule has decreased in size, now measuring 9 mm on image 245/3 (SUV max 1.9). The other subcutaneous nodules seen on the most recent CT have largely resolved. Incidental CT findings: none IMPRESSION: 1. Interval response to treatment in the multiple subcutaneous metastases demonstrated on previous PET-CT and diagnostic CTs. Largest remaining nodule in the subcutaneous fat of the right buttocks remains mildly hypermetabolic. 2. No evidence of local recurrence or metastatic disease within the chest, abdomen or pelvis. 3. Right pleural effusion without hypermetabolic activity. Stable lingular nodularity without hypermetabolic activity. 4. Presumed diffuse treatment changes within the bone marrow. No suspicious osseous activity. 5. Aortic Atherosclerosis (ICD10-I70.0) and Emphysema (ICD10-J43.9). Electronically Signed   By: Richardean Sale M.D.   On: 02/14/2021 17:06     ASSESSMENT:  1.  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 - She reported COVID infection in December 2020 and has been fatigued since then. - Chest x-ray on 06/21/2020 showed masslike opacity in the right lung. - CT chest with contrast on 06/28/2020 showed right upper lobe lung mass measuring 4 x 3.2 x 3.8 cm.  Small noncalcified mediastinal lymph nodes are all subcentimeter, nonspecific.  Small subpleural nodules in the right upper lobe measure up to 5 mm.  Clustered lingular nodules measuring up to 6 mm nonspecific.  Nonspecific 8 mm enhancing focus in the left lobe of the liver. - Right upper lobectomy and lymph node  dissection on 08/15/2020. - Pathology consistent with 4.4 cm large cell neuroendocrine carcinoma, margins negative.  Lymph nodes at stations 7, 8, 9, 10, 11, 4R, 12 negative for metastatic disease.  No visceral pleural invasion or direct invasion of the adjacent structures.  No lymphovascular invasion.  IHC positive for CD56, synaptophysin and TTF-1.  Chromogranin negative.  Ki-67 elevated. - PET scan on 10/01/2020 with postoperative changes in the chest wall.  Intense hyper metabolism in the acromion with SUV 8.6.  Hypermetabolic focus in the right gluteal region favoring injection site. - Foundation 1 results showed TMB high, K-ras G12C mutation positive.  STK 11 positive - PD-L1 TPS 1%. - Right lateral pelvic soft tissue biopsy on 11/27/2020 consistent with metastatic neuroendocrine carcinoma, Ki-67 40-50%. - Based on K-ras G 12 C and STK 11 positivity, we have recommended treatment like non-small cell lung cancer. - Cycle 1 of carboplatin, paclitaxel and pembrolizumab on 12/17/2020.   2.  Social/family history: - She lives by herself at home and is independent of ADLs and IADLs. - She smoked 2 packs/day for 56 years and quit in May 2012. - She worked in [employer]. - Her nephew had colon cancer.  No other malignancies in the family.   PLAN:  1.  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 - We reviewed PET scan from 02/14/2021 which showed interval response to treatment with multiple subcutaneous metastasis.  Largest remaining nodule in the fat of the right buttock remains mildly hypermetabolic.  No evidence of local recurrence or metastatic disease within the chest, abdomen or pelvis.  Right pleural effusion without hypermetabolic activity stable. - We reviewed labs today which  showed normal LFTs.  Creatinine is 1.49 and stable.  Platelets are 120.  TSH is 9.17. - We will proceed with cycle 4 today.  She  will receive 1 L normal saline as her creatinine is mildly elevated. - She may discontinue Lasix at this time. - RTC 3 weeks for follow-up.  At that time we will begin maintenance with Keytruda.   2.  Hypothyroidism: - TSH is 9.217 and gradually worsening. - We will start her on Synthroid 25 mcg daily.  3.  Nausea: - Continue Reglan 5 mg as needed.  4.  Multiple subcutaneous nodules: - Right posterior hip skin nodule has improved.  5.  Right shoulder pain: - She does not report any right shoulder pain.  Use oxycodone as needed.  She has not required in the last few days.  6.  Nutrition: - Continue 4 cans of Ensure daily.   Orders placed this encounter:  No orders of the defined types were placed in this encounter.    Derek Jack, MD [place] [phone]   I, Thana Ates, am acting as a scribe for Dr. Derek Jack.  I, Derek Jack MD, have reviewed the above documentation for accuracy and completeness, and I agree with the above.

## 2021-02-18 ENCOUNTER — Other Ambulatory Visit: Payer: Self-pay

## 2021-02-18 ENCOUNTER — Inpatient Hospital Stay (HOSPITAL_BASED_OUTPATIENT_CLINIC_OR_DEPARTMENT_OTHER): Payer: Medicare Other | Admitting: Hematology

## 2021-02-18 ENCOUNTER — Ambulatory Visit (INDEPENDENT_AMBULATORY_CARE_PROVIDER_SITE_OTHER): Payer: Medicare Other | Admitting: Cardiovascular Disease

## 2021-02-18 ENCOUNTER — Inpatient Hospital Stay (HOSPITAL_COMMUNITY): Payer: Medicare Other

## 2021-02-18 VITALS — HR 63 | Temp 97.1°F | Resp 17 | Ht 70.0 in | Wt 150.5 lb

## 2021-02-18 DIAGNOSIS — C3411 Malignant neoplasm of upper lobe, right bronchus or lung: Secondary | ICD-10-CM

## 2021-02-18 DIAGNOSIS — Z7901 Long term (current) use of anticoagulants: Secondary | ICD-10-CM | POA: Diagnosis not present

## 2021-02-18 DIAGNOSIS — Z5181 Encounter for therapeutic drug level monitoring: Secondary | ICD-10-CM

## 2021-02-18 DIAGNOSIS — Z87891 Personal history of nicotine dependence: Secondary | ICD-10-CM | POA: Diagnosis not present

## 2021-02-18 DIAGNOSIS — R110 Nausea: Secondary | ICD-10-CM | POA: Diagnosis not present

## 2021-02-18 DIAGNOSIS — C7A00 Malignant carcinoid tumor of unspecified site: Secondary | ICD-10-CM | POA: Diagnosis not present

## 2021-02-18 DIAGNOSIS — Z5111 Encounter for antineoplastic chemotherapy: Secondary | ICD-10-CM | POA: Diagnosis not present

## 2021-02-18 DIAGNOSIS — C7A090 Malignant carcinoid tumor of the bronchus and lung: Secondary | ICD-10-CM | POA: Diagnosis not present

## 2021-02-18 DIAGNOSIS — E039 Hypothyroidism, unspecified: Secondary | ICD-10-CM | POA: Diagnosis not present

## 2021-02-18 LAB — CBC WITH DIFFERENTIAL/PLATELET
Abs Immature Granulocytes: 0.05 10*3/uL (ref 0.00–0.07)
Basophils Absolute: 0.1 10*3/uL (ref 0.0–0.1)
Basophils Relative: 1 %
Eosinophils Absolute: 0.1 10*3/uL (ref 0.0–0.5)
Eosinophils Relative: 1 %
HCT: 40.3 % (ref 36.0–46.0)
Hemoglobin: 12.9 g/dL (ref 12.0–15.0)
Immature Granulocytes: 1 %
Lymphocytes Relative: 6 %
Lymphs Abs: 0.6 10*3/uL — ABNORMAL LOW (ref 0.7–4.0)
MCH: 33.2 pg (ref 26.0–34.0)
MCHC: 32 g/dL (ref 30.0–36.0)
MCV: 103.6 fL — ABNORMAL HIGH (ref 80.0–100.0)
Monocytes Absolute: 0.7 10*3/uL (ref 0.1–1.0)
Monocytes Relative: 7 %
Neutro Abs: 7.8 10*3/uL — ABNORMAL HIGH (ref 1.7–7.7)
Neutrophils Relative %: 84 %
Platelets: 120 10*3/uL — ABNORMAL LOW (ref 150–400)
RBC: 3.89 MIL/uL (ref 3.87–5.11)
RDW: 23.3 % — ABNORMAL HIGH (ref 11.5–15.5)
WBC: 9.2 10*3/uL (ref 4.0–10.5)
nRBC: 0 % (ref 0.0–0.2)

## 2021-02-18 LAB — COMPREHENSIVE METABOLIC PANEL
ALT: 21 U/L (ref 0–44)
AST: 31 U/L (ref 15–41)
Albumin: 3.6 g/dL (ref 3.5–5.0)
Alkaline Phosphatase: 125 U/L (ref 38–126)
Anion gap: 10 (ref 5–15)
BUN: 27 mg/dL — ABNORMAL HIGH (ref 8–23)
CO2: 29 mmol/L (ref 22–32)
Calcium: 8.9 mg/dL (ref 8.9–10.3)
Chloride: 102 mmol/L (ref 98–111)
Creatinine, Ser: 1.49 mg/dL — ABNORMAL HIGH (ref 0.44–1.00)
GFR, Estimated: 36 mL/min — ABNORMAL LOW (ref >60)
Glucose, Bld: 115 mg/dL — ABNORMAL HIGH (ref 70–99)
Potassium: 4.5 mmol/L (ref 3.5–5.1)
Sodium: 141 mmol/L (ref 135–145)
Total Bilirubin: 0.4 mg/dL (ref 0.3–1.2)
Total Protein: 6.9 g/dL (ref 6.5–8.1)

## 2021-02-18 LAB — POCT INR: INR: 2.3 (ref 2.0–3.0)

## 2021-02-18 LAB — MAGNESIUM: Magnesium: 2.1 mg/dL (ref 1.7–2.4)

## 2021-02-18 LAB — TSH: TSH: 9.171 u[IU]/mL — ABNORMAL HIGH (ref 0.350–4.500)

## 2021-02-18 MED ORDER — SODIUM CHLORIDE 0.9 % IV SOLN
260.0000 mg | Freq: Once | INTRAVENOUS | Status: AC
  Administered 2021-02-18: 16:00:00 260 mg via INTRAVENOUS
  Filled 2021-02-18: qty 26

## 2021-02-18 MED ORDER — SODIUM CHLORIDE 0.9% FLUSH
10.0000 mL | INTRAVENOUS | Status: DC | PRN
  Administered 2021-02-18: 17:00:00 10 mL

## 2021-02-18 MED ORDER — SODIUM CHLORIDE 0.9 % IV SOLN: Freq: Once | INTRAVENOUS | Status: AC

## 2021-02-18 MED ORDER — SODIUM CHLORIDE 0.9 % IV SOLN
10.0000 mg | Freq: Once | INTRAVENOUS | Status: AC
  Administered 2021-02-18: 11:00:00 10 mg via INTRAVENOUS
  Filled 2021-02-18: qty 10

## 2021-02-18 MED ORDER — PALONOSETRON HCL INJECTION 0.25 MG/5ML
0.2500 mg | Freq: Once | INTRAVENOUS | Status: AC
  Administered 2021-02-18: 11:00:00 0.25 mg via INTRAVENOUS
  Filled 2021-02-18: qty 5

## 2021-02-18 MED ORDER — SODIUM CHLORIDE 0.9 % IV SOLN
140.0000 mg/m2 | Freq: Once | INTRAVENOUS | Status: AC
  Administered 2021-02-18: 13:00:00 258 mg via INTRAVENOUS
  Filled 2021-02-18: qty 43

## 2021-02-18 MED ORDER — FAMOTIDINE 20 MG IN NS 100 ML IVPB: 20.0000 mg | Freq: Once | INTRAVENOUS | Status: DC

## 2021-02-18 MED ORDER — DIPHENHYDRAMINE HCL 50 MG/ML IJ SOLN
25.0000 mg | Freq: Once | INTRAMUSCULAR | Status: AC
  Administered 2021-02-18: 11:00:00 25 mg via INTRAVENOUS
  Filled 2021-02-18: qty 1

## 2021-02-18 MED ORDER — LEVOTHYROXINE SODIUM 25 MCG PO TABS: 25.0000 ug | ORAL_TABLET | Freq: Every day | ORAL | 2 refills | Status: DC

## 2021-02-18 MED ORDER — SODIUM CHLORIDE 0.9 % IV SOLN
150.0000 mg | Freq: Once | INTRAVENOUS | Status: AC
  Administered 2021-02-18: 11:00:00 150 mg via INTRAVENOUS
  Filled 2021-02-18: qty 150

## 2021-02-18 MED ORDER — HEPARIN SOD (PORK) LOCK FLUSH 100 UNIT/ML IV SOLN
500.0000 [IU] | Freq: Once | INTRAVENOUS | Status: AC | PRN
  Administered 2021-02-18: 17:00:00 500 [IU]

## 2021-02-18 MED ORDER — FAMOTIDINE IN NACL 20-0.9 MG/50ML-% IV SOLN
20.0000 mg | Freq: Once | INTRAVENOUS | Status: AC
  Administered 2021-02-18: 11:00:00 20 mg via INTRAVENOUS
  Filled 2021-02-18: qty 50

## 2021-02-18 MED ORDER — SODIUM CHLORIDE 0.9 % IV SOLN
200.0000 mg | Freq: Once | INTRAVENOUS | Status: AC
  Administered 2021-02-18: 12:00:00 200 mg via INTRAVENOUS
  Filled 2021-02-18: qty 8

## 2021-02-18 NOTE — Progress Notes (Signed)
Patient presents today for chemotherapy treatment.  Patient is in satisfactory condition with no new complaints voiced.  Vital signs are stable.  Labs reviewed by Dr. Delton Coombes during her office visit.  All labs are within treatment parameters.  We will proceed with treatment per MD orders.   Patient tolerated treatment well with no complaints voiced.  Patient left via wheelchair in stable condition.  Vital signs stable at discharge.  Follow up as scheduled.

## 2021-02-18 NOTE — Patient Instructions (Signed)
[facility]  Discharge Instructions: Thank you for choosing [facility] to provide your oncology and hematology care.  If you have a lab appointment with the Cancer Center, please come in thru the Main Entrance and check in at the main information desk.  Wear comfortable clothing and clothing appropriate for easy access to any Portacath or PICC line.   We strive to give you quality time with your provider. You may need to reschedule your appointment if you arrive late (15 or more minutes).  Arriving late affects you and other patients whose appointments are after yours.  Also, if you miss three or more appointments without notifying the office, you may be dismissed from the clinic at the provider's discretion.      For prescription refill requests, have your pharmacy contact our office and allow 72 hours for refills to be completed.        To help prevent nausea and vomiting after your treatment, we encourage you to take your nausea medication as directed.  BELOW ARE SYMPTOMS THAT SHOULD BE REPORTED IMMEDIATELY: *FEVER GREATER THAN 100.4 F (38 C) OR HIGHER *CHILLS OR SWEATING *NAUSEA AND VOMITING THAT IS NOT CONTROLLED WITH YOUR NAUSEA MEDICATION *UNUSUAL SHORTNESS OF BREATH *UNUSUAL BRUISING OR BLEEDING *URINARY PROBLEMS (pain or burning when urinating, or frequent urination) *BOWEL PROBLEMS (unusual diarrhea, constipation, pain near the anus) TENDERNESS IN MOUTH AND THROAT WITH OR WITHOUT PRESENCE OF ULCERS (sore throat, sores in mouth, or a toothache) UNUSUAL RASH, SWELLING OR PAIN  UNUSUAL VAGINAL DISCHARGE OR ITCHING   Items with * indicate a potential emergency and should be followed up as soon as possible or go to the Emergency Department if any problems should occur.  Please show the CHEMOTHERAPY ALERT CARD or IMMUNOTHERAPY ALERT CARD at check-in to the Emergency Department and triage nurse.  Should you have questions after your visit or need to cancel  or reschedule your appointment, please contact [facility] [phone]  and follow the prompts.  Office hours are 8:00 a.m. to 4:30 p.m. Monday - Friday. Please note that voicemails left after 4:00 p.m. may not be returned until the following business day.  We are closed weekends and major holidays. You have access to a nurse at all times for urgent questions. Please call the main number to the clinic [phone] and follow the prompts.  For any non-urgent questions, you may also contact your provider using MyChart. We now offer e-Visits for anyone 18 and older to request care online for non-urgent symptoms. For details visit [email or web address].   Also download the MyChart app! Go to the app store, search "MyChart", open the app, select [place], and log in with your MyChart username and password.  Due to Covid, a mask is required upon entering the hospital/clinic. If you do not have a mask, one will be given to you upon arrival. For doctor visits, patients may have 1 support person aged 18 or older with them. For treatment visits, patients cannot have anyone with them due to current Covid guidelines and our immunocompromised population.  

## 2021-02-18 NOTE — Patient Instructions (Signed)
Description   Called and spoke to pt and instructed her to continue taking warfarin 1 tablet (2.5mg ) daily. Recheck INR in 1 week. Coumadin Clinic [phone]

## 2021-02-18 NOTE — Patient Instructions (Addendum)
[place] at [facility] Discharge Instructions   You were seen and examined today by Dr. Delton Coombes.  He reviewed the results of your PET scan, which showed your cancer has responded well to treatment.  We have sent a prescription for you for thyroid medication - take every morning 1 hour before eating breakfast.  You can stop taking Lasix (fluid pill).  Try to increase your water intake to 4 bottles a day.  We will proceed with your treatment today.  Return as scheduled in 3 weeks.    Thank you for choosing [place] at [facility] to provide your oncology and hematology care.  To afford each patient quality time with our provider, please arrive at least 15 minutes before your scheduled appointment time.   If you have a lab appointment with the [facility] please come in thru the Main Entrance and check in at the main information desk.  You need to re-schedule your appointment should you arrive 10 or more minutes late.  We strive to give you quality time with our providers, and arriving late affects you and other patients whose appointments are after yours.  Also, if you no show three or more times for appointments you may be dismissed from the clinic at the providers discretion.     Again, thank you for choosing [facility].  Our hope is that these requests will decrease the amount of time that you wait before being seen by our physicians.       _____________________________________________________________  Should you have questions after your visit to [facility], please contact our office at [phone] and follow the prompts.  Our office hours are 8:00 a.m. and 4:30 p.m. Monday - Friday.  Please note that voicemails left after 4:00 p.m. may not be returned until the following business day.  We are closed weekends and major holidays.  You do have access to a nurse 24-7, just call the main number to the  clinic [phone] and do not press any options, hold on the line and a nurse will answer the phone.    For prescription refill requests, have your pharmacy contact our office and allow 72 hours.    Due to Covid, you will need to wear a mask upon entering the hospital. If you do not have a mask, a mask will be given to you at the Main Entrance upon arrival. For doctor visits, patients may have 1 support person age 44 or older with them. For treatment visits, patients can not have anyone with them due to social distancing guidelines and our immunocompromised population.

## 2021-02-18 NOTE — Progress Notes (Signed)
Patient has been examined by Dr. Katragadda, and vital signs and labs have been reviewed. ANC, Creatinine, LFTs, hemoglobin, and platelets are within treatment parameters per M.D. - pt may proceed with treatment.    °

## 2021-02-19 LAB — T4: T4, Total: 13.1 ug/dL — ABNORMAL HIGH (ref 4.5–12.0)

## 2021-02-20 ENCOUNTER — Inpatient Hospital Stay (HOSPITAL_COMMUNITY): Payer: Medicare Other

## 2021-02-20 ENCOUNTER — Other Ambulatory Visit: Payer: Self-pay

## 2021-02-20 VITALS — BP 105/59 | HR 57 | Temp 97.8°F | Resp 18

## 2021-02-20 DIAGNOSIS — E039 Hypothyroidism, unspecified: Secondary | ICD-10-CM | POA: Diagnosis not present

## 2021-02-20 DIAGNOSIS — Z87891 Personal history of nicotine dependence: Secondary | ICD-10-CM | POA: Diagnosis not present

## 2021-02-20 DIAGNOSIS — Z5111 Encounter for antineoplastic chemotherapy: Secondary | ICD-10-CM | POA: Diagnosis not present

## 2021-02-20 DIAGNOSIS — C7A090 Malignant carcinoid tumor of the bronchus and lung: Secondary | ICD-10-CM | POA: Diagnosis not present

## 2021-02-20 DIAGNOSIS — R110 Nausea: Secondary | ICD-10-CM | POA: Diagnosis not present

## 2021-02-20 DIAGNOSIS — C7A00 Malignant carcinoid tumor of unspecified site: Secondary | ICD-10-CM | POA: Diagnosis not present

## 2021-02-20 DIAGNOSIS — C3411 Malignant neoplasm of upper lobe, right bronchus or lung: Secondary | ICD-10-CM

## 2021-02-20 MED ORDER — PEGFILGRASTIM-CBQV 6 MG/0.6ML ~~LOC~~ SOSY
6.0000 mg | PREFILLED_SYRINGE | Freq: Once | SUBCUTANEOUS | Status: AC
  Administered 2021-02-20: 10:00:00 6 mg via SUBCUTANEOUS
  Filled 2021-02-20: qty 0.6

## 2021-02-20 NOTE — Patient Instructions (Signed)
[place]  Discharge Instructions: Thank you for choosing [place] to provide your oncology and hematology care.  If you have a lab appointment with the [facility], please come in thru the Main Entrance and check in at the main information desk.  Wear comfortable clothing and clothing appropriate for easy access to any Portacath or PICC line.   We strive to give you quality time with your provider. You may need to reschedule your appointment if you arrive late (15 or more minutes).  Arriving late affects you and other patients whose appointments are after yours.  Also, if you miss three or more appointments without notifying the office, you may be dismissed from the clinic at the providers discretion.      For prescription refill requests, have your pharmacy contact our office and allow 72 hours for refills to be completed.    Today you received the following chemotherapy and/or immunotherapy agents Udenyca      To help prevent nausea and vomiting after your treatment, we encourage you to take your nausea medication as directed.  BELOW ARE SYMPTOMS THAT SHOULD BE REPORTED IMMEDIATELY: *FEVER GREATER THAN 100.4 F (38 C) OR HIGHER *CHILLS OR SWEATING *NAUSEA AND VOMITING THAT IS NOT CONTROLLED WITH YOUR NAUSEA MEDICATION *UNUSUAL SHORTNESS OF BREATH *UNUSUAL BRUISING OR BLEEDING *URINARY PROBLEMS (pain or burning when urinating, or frequent urination) *BOWEL PROBLEMS (unusual diarrhea, constipation, pain near the anus) TENDERNESS IN MOUTH AND THROAT WITH OR WITHOUT PRESENCE OF ULCERS (sore throat, sores in mouth, or a toothache) UNUSUAL RASH, SWELLING OR PAIN  UNUSUAL VAGINAL DISCHARGE OR ITCHING   Items with * indicate a potential emergency and should be followed up as soon as possible or go to the Emergency Department if any problems should occur.  Please show the CHEMOTHERAPY ALERT CARD or IMMUNOTHERAPY ALERT CARD at check-in to the Emergency  Department and triage nurse.  Should you have questions after your visit or need to cancel or reschedule your appointment, please contact [facility] [phone]  and follow the prompts.  Office hours are 8:00 a.m. to 4:30 p.m. Monday - Friday. Please note that voicemails left after 4:00 p.m. may not be returned until the following business day.  We are closed weekends and major holidays. You have access to a nurse at all times for urgent questions. Please call the main number to the clinic [phone] and follow the prompts.  For any non-urgent questions, you may also contact your provider using MyChart. We now offer e-Visits for anyone 7 and older to request care online for non-urgent symptoms. For details visit [email or web address].   Also download the MyChart app! Go to the app store, search "MyChart", open the app, select [place], and log in with your MyChart username and password.  Due to Covid, a mask is required upon entering the hospital/clinic. If you do not have a mask, one will be given to you upon arrival. For doctor visits, patients may have 1 support person aged 65 or older with them. For treatment visits, patients cannot have anyone with them due to current Covid guidelines and our immunocompromised population.

## 2021-02-20 NOTE — Progress Notes (Signed)
Katelyn [name] presents today for Udenyca injection per the provider's orders.  Stable during administration without incident; injection site WNL; see MAR for injection details.  Patient tolerated procedure well and without incident.  No questions or complaints noted at this time. Discharge from clinic ambulatory in stable condition.  Alert and oriented X 3.  Follow up with [facility] as scheduled.

## 2021-02-24 ENCOUNTER — Other Ambulatory Visit (INDEPENDENT_AMBULATORY_CARE_PROVIDER_SITE_OTHER): Payer: Self-pay | Admitting: Nurse Practitioner

## 2021-02-24 DIAGNOSIS — J310 Chronic rhinitis: Secondary | ICD-10-CM

## 2021-02-25 DIAGNOSIS — Z510 Encounter for antineoplastic radiation therapy: Secondary | ICD-10-CM | POA: Diagnosis not present

## 2021-02-25 DIAGNOSIS — C7B8 Other secondary neuroendocrine tumors: Secondary | ICD-10-CM | POA: Diagnosis not present

## 2021-02-25 DIAGNOSIS — C7A8 Other malignant neuroendocrine tumors: Secondary | ICD-10-CM | POA: Diagnosis not present

## 2021-02-26 ENCOUNTER — Ambulatory Visit (INDEPENDENT_AMBULATORY_CARE_PROVIDER_SITE_OTHER): Payer: Medicare Other

## 2021-02-26 DIAGNOSIS — Z7901 Long term (current) use of anticoagulants: Secondary | ICD-10-CM

## 2021-02-26 DIAGNOSIS — J302 Other seasonal allergic rhinitis: Secondary | ICD-10-CM | POA: Insufficient documentation

## 2021-02-26 DIAGNOSIS — Z5181 Encounter for therapeutic drug level monitoring: Secondary | ICD-10-CM | POA: Diagnosis not present

## 2021-02-26 LAB — POCT INR: INR: 2.7 (ref 2.0–3.0)

## 2021-02-26 NOTE — Patient Instructions (Signed)
Description   Spoke with pt and instructed to continue taking warfarin 1 tablet (2.5mg ) daily. Recheck INR in 1 week. Coumadin Clinic [phone]

## 2021-03-04 LAB — POCT INR: INR: 2.8 (ref 2.0–3.0)

## 2021-03-05 ENCOUNTER — Ambulatory Visit (INDEPENDENT_AMBULATORY_CARE_PROVIDER_SITE_OTHER): Payer: Medicare Other | Admitting: Internal Medicine

## 2021-03-05 DIAGNOSIS — Z7901 Long term (current) use of anticoagulants: Secondary | ICD-10-CM | POA: Diagnosis not present

## 2021-03-11 ENCOUNTER — Other Ambulatory Visit: Payer: Self-pay

## 2021-03-11 ENCOUNTER — Other Ambulatory Visit (HOSPITAL_COMMUNITY): Payer: Medicare Other

## 2021-03-11 ENCOUNTER — Inpatient Hospital Stay (HOSPITAL_COMMUNITY): Payer: Medicare Other

## 2021-03-11 ENCOUNTER — Ambulatory Visit (HOSPITAL_COMMUNITY): Payer: Medicare Other | Admitting: Hematology

## 2021-03-11 ENCOUNTER — Ambulatory Visit (HOSPITAL_COMMUNITY): Payer: Medicare Other

## 2021-03-11 ENCOUNTER — Inpatient Hospital Stay (HOSPITAL_COMMUNITY): Payer: Medicare Other | Attending: Hematology | Admitting: Hematology

## 2021-03-11 VITALS — BP 126/68 | HR 64 | Temp 97.8°F | Resp 18 | Ht 70.0 in | Wt 152.6 lb

## 2021-03-11 VITALS — BP 139/69 | HR 60 | Temp 96.9°F | Resp 18

## 2021-03-11 DIAGNOSIS — Z79899 Other long term (current) drug therapy: Secondary | ICD-10-CM | POA: Diagnosis not present

## 2021-03-11 DIAGNOSIS — Z5111 Encounter for antineoplastic chemotherapy: Secondary | ICD-10-CM | POA: Insufficient documentation

## 2021-03-11 DIAGNOSIS — C7A090 Malignant carcinoid tumor of the bronchus and lung: Secondary | ICD-10-CM | POA: Diagnosis not present

## 2021-03-11 DIAGNOSIS — C3411 Malignant neoplasm of upper lobe, right bronchus or lung: Secondary | ICD-10-CM

## 2021-03-11 LAB — COMPREHENSIVE METABOLIC PANEL
ALT: 25 U/L (ref 0–44)
AST: 32 U/L (ref 15–41)
Albumin: 3.5 g/dL (ref 3.5–5.0)
Alkaline Phosphatase: 125 U/L (ref 38–126)
Anion gap: 7 (ref 5–15)
BUN: 25 mg/dL — ABNORMAL HIGH (ref 8–23)
CO2: 28 mmol/L (ref 22–32)
Calcium: 8.8 mg/dL — ABNORMAL LOW (ref 8.9–10.3)
Chloride: 105 mmol/L (ref 98–111)
Creatinine, Ser: 1.18 mg/dL — ABNORMAL HIGH (ref 0.44–1.00)
GFR, Estimated: 47 mL/min — ABNORMAL LOW (ref >60)
Glucose, Bld: 106 mg/dL — ABNORMAL HIGH (ref 70–99)
Potassium: 4.3 mmol/L (ref 3.5–5.1)
Sodium: 140 mmol/L (ref 135–145)
Total Bilirubin: 0.7 mg/dL (ref 0.3–1.2)
Total Protein: 6.5 g/dL (ref 6.5–8.1)

## 2021-03-11 LAB — CBC WITH DIFFERENTIAL/PLATELET
Abs Immature Granulocytes: 0.05 10*3/uL (ref 0.00–0.07)
Basophils Absolute: 0.1 10*3/uL (ref 0.0–0.1)
Basophils Relative: 1 %
Eosinophils Absolute: 0.1 10*3/uL (ref 0.0–0.5)
Eosinophils Relative: 1 %
HCT: 37.1 % (ref 36.0–46.0)
Hemoglobin: 11.7 g/dL — ABNORMAL LOW (ref 12.0–15.0)
Immature Granulocytes: 1 %
Lymphocytes Relative: 5 %
Lymphs Abs: 0.4 10*3/uL — ABNORMAL LOW (ref 0.7–4.0)
MCH: 34.3 pg — ABNORMAL HIGH (ref 26.0–34.0)
MCHC: 31.5 g/dL (ref 30.0–36.0)
MCV: 108.8 fL — ABNORMAL HIGH (ref 80.0–100.0)
Monocytes Absolute: 0.7 10*3/uL (ref 0.1–1.0)
Monocytes Relative: 7 %
Neutro Abs: 7.8 10*3/uL — ABNORMAL HIGH (ref 1.7–7.7)
Neutrophils Relative %: 85 %
Platelets: 99 10*3/uL — ABNORMAL LOW (ref 150–400)
RBC: 3.41 MIL/uL — ABNORMAL LOW (ref 3.87–5.11)
RDW: 21.5 % — ABNORMAL HIGH (ref 11.5–15.5)
WBC: 9.1 10*3/uL (ref 4.0–10.5)
nRBC: 0 % (ref 0.0–0.2)

## 2021-03-11 LAB — TSH: TSH: 7.237 u[IU]/mL — ABNORMAL HIGH (ref 0.350–4.500)

## 2021-03-11 LAB — MAGNESIUM: Magnesium: 2 mg/dL (ref 1.7–2.4)

## 2021-03-11 MED ORDER — SODIUM CHLORIDE 0.9 % IV SOLN: Freq: Once | INTRAVENOUS | Status: AC

## 2021-03-11 MED ORDER — HEPARIN SOD (PORK) LOCK FLUSH 100 UNIT/ML IV SOLN
500.0000 [IU] | Freq: Once | INTRAVENOUS | Status: AC | PRN
  Administered 2021-03-11: 500 [IU]

## 2021-03-11 MED ORDER — SODIUM CHLORIDE 0.9% FLUSH
10.0000 mL | INTRAVENOUS | Status: DC | PRN
  Administered 2021-03-11: 10 mL

## 2021-03-11 MED ORDER — SODIUM CHLORIDE 0.9 % IV SOLN
200.0000 mg | Freq: Once | INTRAVENOUS | Status: AC
  Administered 2021-03-11: 200 mg via INTRAVENOUS
  Filled 2021-03-11: qty 8

## 2021-03-11 NOTE — Progress Notes (Signed)
Patient presents today for Keytruda infusion per providers order.  Vital signs within parameters.  Labs pending.  Patient has no new complaints at this time.  PLT noted to be 99, Dr. Delton Coombes notified.  Keytruda infusion given today per MD orders.  Stable during infusion without adverse affects.  Vital signs stable.  No complaints at this time.  Discharge from clinic via wheelchair in stable condition.  Alert and oriented X 3.  Follow up with [facility] as scheduled.

## 2021-03-11 NOTE — Patient Instructions (Addendum)
[place] at [facility] Discharge Instructions   You were seen and examined today by Dr. Delton Coombes.  He reviewed your lab work which was normal/stable.  Continue Synthroid as prescribed for your thyroid.   We will proceed with your treatment today.  Return as scheduled for lab work, office visit, and treatment.      Thank you for choosing [place] at [facility] to provide your oncology and hematology care.  To afford each patient quality time with our provider, please arrive at least 15 minutes before your scheduled appointment time.   If you have a lab appointment with the [facility] please come in thru the Main Entrance and check in at the main information desk.  You need to re-schedule your appointment should you arrive 10 or more minutes late.  We strive to give you quality time with our providers, and arriving late affects you and other patients whose appointments are after yours.  Also, if you no show three or more times for appointments you may be dismissed from the clinic at the providers discretion.     Again, thank you for choosing [facility].  Our hope is that these requests will decrease the amount of time that you wait before being seen by our physicians.       _____________________________________________________________  Should you have questions after your visit to [facility], please contact our office at [phone] and follow the prompts.  Our office hours are 8:00 a.m. and 4:30 p.m. Monday - Friday.  Please note that voicemails left after 4:00 p.m. may not be returned until the following business day.  We are closed weekends and major holidays.  You do have access to a nurse 24-7, just call the main number to the clinic [phone] and do not press any options, hold on the line and a nurse will answer the phone.    For prescription refill requests, have your pharmacy contact our office  and allow 72 hours.    Due to Covid, you will need to wear a mask upon entering the hospital. If you do not have a mask, a mask will be given to you at the Main Entrance upon arrival. For doctor visits, patients may have 1 support person age 43 or older with them. For treatment visits, patients can not have anyone with them due to social distancing guidelines and our immunocompromised population.

## 2021-03-11 NOTE — Patient Instructions (Signed)
[place]  Discharge Instructions: Thank you for choosing [place] to provide your oncology and hematology care.  If you have a lab appointment with the [facility], please come in thru the Main Entrance and check in at the main information desk.  Wear comfortable clothing and clothing appropriate for easy access to any Portacath or PICC line.   We strive to give you quality time with your provider. You may need to reschedule your appointment if you arrive late (15 or more minutes).  Arriving late affects you and other patients whose appointments are after yours.  Also, if you miss three or more appointments without notifying the office, you may be dismissed from the clinic at the providers discretion.      For prescription refill requests, have your pharmacy contact our office and allow 72 hours for refills to be completed.    Today you received the following chemotherapy and/or immunotherapy agents Keytruda      To help prevent nausea and vomiting after your treatment, we encourage you to take your nausea medication as directed.  BELOW ARE SYMPTOMS THAT SHOULD BE REPORTED IMMEDIATELY: *FEVER GREATER THAN 100.4 F (38 C) OR HIGHER *CHILLS OR SWEATING *NAUSEA AND VOMITING THAT IS NOT CONTROLLED WITH YOUR NAUSEA MEDICATION *UNUSUAL SHORTNESS OF BREATH *UNUSUAL BRUISING OR BLEEDING *URINARY PROBLEMS (pain or burning when urinating, or frequent urination) *BOWEL PROBLEMS (unusual diarrhea, constipation, pain near the anus) TENDERNESS IN MOUTH AND THROAT WITH OR WITHOUT PRESENCE OF ULCERS (sore throat, sores in mouth, or a toothache) UNUSUAL RASH, SWELLING OR PAIN  UNUSUAL VAGINAL DISCHARGE OR ITCHING   Items with * indicate a potential emergency and should be followed up as soon as possible or go to the Emergency Department if any problems should occur.  Please show the CHEMOTHERAPY ALERT CARD or IMMUNOTHERAPY ALERT CARD at check-in to the Emergency  Department and triage nurse.  Should you have questions after your visit or need to cancel or reschedule your appointment, please contact [facility] [phone]  and follow the prompts.  Office hours are 8:00 a.m. to 4:30 p.m. Monday - Friday. Please note that voicemails left after 4:00 p.m. may not be returned until the following business day.  We are closed weekends and major holidays. You have access to a nurse at all times for urgent questions. Please call the main number to the clinic [phone] and follow the prompts.  For any non-urgent questions, you may also contact your provider using MyChart. We now offer e-Visits for anyone 91 and older to request care online for non-urgent symptoms. For details visit [email or web address].   Also download the MyChart app! Go to the app store, search "MyChart", open the app, select [place], and log in with your MyChart username and password.  Due to Covid, a mask is required upon entering the hospital/clinic. If you do not have a mask, one will be given to you upon arrival. For doctor visits, patients may have 1 support person aged 22 or older with them. For treatment visits, patients cannot have anyone with them due to current Covid guidelines and our immunocompromised population.

## 2021-03-11 NOTE — Progress Notes (Signed)
Katelyn [name], [place] [address]   CLINIC:  Medical Oncology/Hematology  PCP:  Katelyn Squibb, MD [address] Katelyn [name] [phone]   REASON FOR VISIT:  Follow-up for right lung cancer  PRIOR THERAPY: Right upper lobectomy (08/15/20)  NGS Results: not done  CURRENT THERAPY: Carboplatin, Taxol, and Keytruda every 3 weeks x 4 cycles; to be followed by Beryle Flock maintenance every 3 weeks  BRIEF ONCOLOGIC HISTORY:  Oncology History Overview Note  Foundation One PD-L1 TPS score 1 16 mut/MB [id number] mutation MSI stable   Cancer of upper lobe of right lung ([place])  08/15/2020 Pathology Results   FINAL MICROSCOPIC DIAGNOSIS:   A. 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  - Tumor is limited to lung.  - Margins are negative for carcinoma.  - One of one lymph nodes negative for carcinoma (0/1).  - See oncology table.   B. LYMPH NODE, 7, EXCISION:  - One of one lymph nodes negative for carcinoma (0/1).   C. LYMPH NODE, 7 #2, EXCISION:  - One of one lymph nodes negative for carcinoma (0/1).   D. LYMPH NODE, 9, EXCISION:  - One of one lymph nodes negative for carcinoma (0/1).   E. LYMPH NODE, 8, EXCISION:  - One of one lymph nodes negative for carcinoma (0/1).   F. LYMPH NODE, 7 #3, EXCISION:  - One of one lymph nodes negative for carcinoma (0/1).   G. LYMPH NODE, 11, EXCISION:  - One of one lymph nodes negative for carcinoma (0/1).   H. LYMPH NODE, 10, EXCISION:  - One of one lymph nodes negative for carcinoma (0/1).   I. LYMPH NODE, 4R, EXCISION:  - One of one lymph nodes negative for carcinoma (0/1).   J. LYMPH NODE, 10 #2, EXCISION:   - One of one lymph nodes negative for carcinoma (0/1).  - Fibrosis and calcifications.   K. LYMPH NODE, 12, EXCISION:  - One of one lymph nodes negative for carcinoma (0/1).   L. LYMPH NODE, 12 #2, EXCISION:  - One of one  lymph nodes negative for carcinoma (0/1).   ONCOLOGY TABLE:   LUNG: Resection   Synchronous Tumors: Not applicable  Total Number of Primary Tumors: 1  Procedure: Right upper lobe lobectomy with lymph node biopsies  Specimen Laterality: Right  Tumor Focality: Unifocal  Tumor Site: Right upper lobe  Tumor Size: 4.4 cm  Histologic Type: Large cell neuroendocrine carcinoma  Visceral Pleura Invasion: Not identified  Direct Invasion of Adjacent Structures: No adjacent structures present  Lymphovascular Invasion: Not identified  Margins: All margins negative for invasive carcinoma       Closest Margin(s) to Invasive Carcinoma: 1.5 cm to bronchial margin  Treatment Effect: No known presurgical therapy  Regional Lymph Nodes:       Number of Lymph Nodes Involved: 0                            Nodal Sites with Tumor: Not applicable       Number of Lymph Nodes Examined: 12                       Nodal Sites Examined: 4, 7, 8, 9, 10, 11, 12  Distant Metastasis:       Distant Site(s) Involved: Not applicable  Pathologic Stage Classification (pTNM, AJCC 8th  Edition): pT2b, pN0  Ancillary Studies: Can be performed upon request  Representative Tumor Block: A2  Comment(s): The tumor has a subtle neuroendocrine features, cribriform areas with necrosis, and geographic areas of necrosis.  Immunohistochemistry is positive for CD56, synaptophysin, and TTF-1.  Chromogranin is largely negative. Ki-67 is elevated. Dr. Saralyn [name] has reviewed the case.     08/15/2020 Surgery   PREOPERATIVE DIAGNOSIS:  Right upper lobe lung mass, suspected non-small cell carcinoma.   POSTOPERATIVE DIAGNOSIS:  Non-small cell carcinoma, right upper lobe, clinical stage IB (T2, N0).   PROCEDURE:   Xi robotic-assisted right upper lobectomy,  Lymph node dissection,  Intercostal nerve blocks levels 3 through 10.   SURGEON:  Katelyn Charon, MD    FINDINGS:  Fragile tissue.  Enlarged but otherwise benign appearing nodes.   One calcified node.  Frozen section revealed non-small cell carcinoma.  Bronchial margin clear.   09/04/2020 Initial Diagnosis   Cancer of upper lobe of right lung ([facility])   09/04/2020 Cancer Staging   Staging form: Lung, AJCC 8th Edition - Pathologic stage from 09/04/2020: pT2, pN0, cM0 - Signed by Katelyn Lark, MD on 09/04/2020 Stage prefix: Initial diagnosis    09/11/2020 Genetic Testing   PD-L1 Results:     12/11/2020 Cancer Staging   Staging form: Lung, AJCC 8th Edition - Pathologic stage from 12/11/2020: Stage IVB (pT2b, pN0, pM1c) - Signed by Katelyn Jack, MD on 12/11/2020 Histopathologic type: Large cell neuroendocrine carcinoma    12/17/2020 -  Chemotherapy   Patient is on Treatment Plan : LUNG NSCLC Carboplatin + Paclitaxel + Pembrolizumab q21d x 4 cycles / Pembrolizumab Maintenance Q21D       CANCER STAGING:  Cancer Staging  Cancer of upper lobe of right lung [facility]) Staging form: Lung, AJCC 8th Edition - Pathologic stage from 09/04/2020: pT2, pN0, cM0 - Signed by Katelyn Lark, MD on 09/04/2020 - Pathologic stage from 12/11/2020: Stage IVB (pT2b, pN0, pM1c) - Signed by Katelyn Jack, MD on 12/11/2020   INTERVAL HISTORY:  Katelyn [name], a 81 y.o. female, returns for routine follow-up and consideration for next cycle of chemotherapy. Katelyn [name] was last seen on 02/18/2021.  Due for cycle #5 of Carboplatin, Taxol, and Keytruda today.   Overall, she tells me she has been feeling pretty well. She reports coughing yesterday which has improved today. She denies recent infections. She denies n/v/d/c, and her appetite has improved. She reports tingling in her right fingers. She is drinking 4 cans of Ensure daily. She is walking with the assistance of a walker at home.   Overall, she feels ready for next cycle of chemo today.    REVIEW OF SYSTEMS:  Review of Systems  Constitutional:  Positive for fatigue. Negative for appetite change (improved).  Respiratory:  Positive  for cough (improved).   Gastrointestinal:  Negative for constipation, diarrhea, nausea and vomiting.  Neurological:  Positive for numbness.  All other systems reviewed and are negative.  PAST MEDICAL/SURGICAL HISTORY:  Past Medical History:  Diagnosis Date   Allergy    rhinitis   Aortic stenosis, severe    a. s/p TAVR 08/2014.   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 April 2012 which revealed mid RCA occlusion with collaterals, 50% LAD stenosis, 30% circumflex stenosis, and 40% marginal stenosis   First degree AV block    GERD (gastroesophageal reflux disease)    Heart murmur    Hyperlipidemia    Hypertension    Left bundle branch block    LUMBAR RADICULOPATHY, RIGHT    PAF (paroxysmal atrial fibrillation) (H[facility])    Peripheral vascular disease ([place])    a. s/p aortobifem bypass graft surgery and bilateral fundoplasty by [facility].   Renal artery stenosis ([place])    a. renal artery stenosis (>60% L renal artery) by duplex 04/2020.   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 bilateral subclavian stenosis with antegrade flow by duplex 07/2020   Past Surgical History:  Procedure Laterality Date   AORTO-FEMORAL BYPASS GRAFT Bilateral 06/27/10   BASAL CELL CARCINOMA EXCISION Left    face   CARDIAC CATHETERIZATION N/A 07/25/2014   Procedure: Right/Left Heart Cath and Coronary Angiography;  Surgeon: Troy Sine, MD;  Location: [facility];   Service: Cardiovascular;  Laterality: N/A;   CATARACT EXTRACTION, BILATERAL Bilateral 09/29/2016   COLONOSCOPY  2008   Dr. Laural Golden: normal.    DILATION AND CURETTAGE OF UTERUS  "2 or 3"   ESOPHAGOGASTRODUODENOSCOPY N/A 08/18/2017   Dr. Gala Romney: Normal-appearing esophagus status post dilation.  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  Small hiatal hernia.   INTERCOSTAL NERVE BLOCK Right 08/15/2020   Procedure: INTERCOSTAL NERVE BLOCK RIGHT;  Surgeon: Melrose Nakayama, MD;  Location: [facility];  Service: Thoracic;  Laterality: Right;   IR THORACENTESIS ASP PLEURAL SPACE W/IMG GUIDE  10/07/2020   MALONEY DILATION N/A 08/18/2017   Procedure: Venia Minks DILATION;  Surgeon: Daneil Dolin, MD;  [facility];  Service: Endoscopy;  Laterality: N/A;   MULTIPLE TOOTH EXTRACTIONS     NODE DISSECTION Right 08/15/2020   Procedure: NODE DISSECTION;  Surgeon: Melrose Nakayama, MD;  Location: [facility];  Service: Thoracic;  Laterality: Right;   PORTACATH PLACEMENT Left 01/15/2021   Procedure: INSERTION PORT-A-CATH;  Surgeon: Aviva Signs, MD;  Location: [facility];  Service: General;  Laterality: Left;   PR VEIN BYPASS GRAFT,AORTO-FEM-POP  06/12/10   TEE WITHOUT CARDIOVERSION N/A 08/31/2014   Procedure: TRANSESOPHAGEAL ECHOCARDIOGRAM (TEE);  Surgeon: Rexene Alberts, MD;  Location: [name];  Service: Open Heart Surgery;  Laterality: N/A;   TRANSCATHETER AORTIC VALVE REPLACEMENT, TRANSFEMORAL N/A 08/31/2014   Procedure: TRANSCATHETER AORTIC VALVE REPLACEMENT, TRANSFEMORAL approach;  Surgeon: Rexene Alberts, MD;  Location: [facility];  Service: Open Heart Surgery;  Laterality: N/A;   TUBAL LIGATION  1970's   VAGINAL HYSTERECTOMY  1970's   Partial     SOCIAL HISTORY:  Social History   Socioeconomic History   Marital status: Widowed    Spouse name: bobby   Number of children: 1   Years of education: Not on file   Highest education level: High school graduate  Occupational History   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 Retired,used to work at [employer].Lives alone.         Patient has one daughter, 3 grands   Likes to crochet   Works in [employer].    Social Determinants of Health   Financial Resource Strain: Not on file  Food Insecurity: No Food Insecurity   Worried About Charity fundraiser in the Last Year: Never true   Ran Out of Food in the Last Year: Never true  Transportation Needs: No Transportation Needs   Lack of Transportation (Medical): No   Lack of Transportation (Non-Medical): No  Physical Activity: Not on file  Stress: Not on file  Social Connections: Not on file  Intimate Partner Violence: Not on file    FAMILY HISTORY:  Family History  Problem Relation Age of Onset   Heart disease Mother 31       died young   Varicose Veins Mother    Stroke Father    Hypertension Father    Heart disease Father 70       Aneurysm   Heart attack Father    Arthritis Brother    Stroke Brother    Diabetes Daughter    Arthritis Daughter    Heart attack Brother        unsure   Allergic rhinitis Neg Hx    Angioedema Neg Hx    Asthma Neg Hx    Atopy Neg Hx    Eczema Neg Hx    Immunodeficiency Neg Hx    Urticaria Neg Hx     CURRENT MEDICATIONS:  Current Outpatient Medications  Medication Sig Dispense Refill   amiodarone (PACERONE) 200 MG tablet Take 1 tablet by mouth once daily 90 tablet 0   CARBOPLATIN IV Inject into the vein every 21 ( twenty-one) days.     diphenoxylate-atropine  (LOMOTIL) 2.5-0.025 MG tablet Take 2 pills at the onset of diarrhea, and then 1 tablet after each episode of diarrhea. 60 tablet 3   estradiol (ESTRACE) 2 MG tablet Take 1 tablet (2 mg total) by mouth daily. 90 tablet 0   folic acid (FOLVITE) 1 MG tablet folic acid 1 mg tablet  TAKE 1 TABLET BY MOUTH ONCE DAILY     furosemide (LASIX) 20 MG tablet Take 1 tablet (20 mg total) by mouth daily. 30 tablet 1   irbesartan (AVAPRO) 300 MG tablet Take 300 mg by mouth daily.     levothyroxine (SYNTHROID) 25 MCG tablet Take 1 tablet (25 mcg total) by mouth daily before breakfast. 30 tablet 2   lidocaine-prilocaine (EMLA) cream Apply a small amount to port a cath site and cover with plastic wrap 1 hour prior to infusion appointments 30 g 3   meloxicam (MOBIC) 15 MG tablet Take 1 tablet by mouth once daily (Patient taking differently: Take 15 mg by mouth daily as needed for pain.) 30 tablet 3   metoCLOPramide (REGLAN) 5 MG tablet Take 2 tablets (10 mg total) by mouth 3 (three) times daily before meals. 30 tablet 3  metoprolol succinate (TOPROL-XL) 25 MG 24 hr tablet Take 1 tablet by mouth once daily 90 tablet 2   OVER THE COUNTER MEDICATION Apply 1 application topically every other day. Lantispetic cream     PACLITAXEL IV Inject into the vein every 21 ( twenty-one) days.     pantoprazole (PROTONIX) 40 MG tablet TAKE 1 TABLET BY MOUTH ONCE DAILY BEFORE SUPPER (Patient taking differently: Take 40 mg by mouth daily.) 90 tablet 3   Pembrolizumab (KEYTRUDA IV) Inject into the vein every 21 ( twenty-one) days.     potassium chloride (KLOR-CON) 10 MEQ tablet Take 1 tablet (10 mEq total) by mouth daily. 14 tablet 1   rosuvastatin (CRESTOR) 20 MG tablet Take 1 tablet (20 mg total) by mouth at bedtime. 90 tablet 0   Specialty Vitamins Products (MAGNESIUM, AMINO ACID CHELATE,) 133 MG tablet Take 1 tablet by mouth 2 (two) times daily. 60 tablet 3   warfarin (COUMADIN) 2.5 MG tablet Take 1 tablet (2.5 mg total) by mouth  daily. 90 tablet 0   acetaminophen (TYLENOL) 500 MG tablet Take 500 mg by mouth every 6 (six) hours as needed for moderate pain. (Patient not taking: Reported on 03/11/2021)     diphenhydrAMINE (BENADRYL) 25 MG tablet Take 25 mg by mouth every 6 (six) hours as needed for allergies. (Patient not taking: Reported on 03/11/2021)     ipratropium (ATROVENT) 0.06 % nasal spray USE 1 SPRAY(S) IN EACH NOSTRIL EVERY 6 HOURS AS NEEDED FOR RUNNY NOSE (Patient not taking: Reported on 03/11/2021) 15 mL 2   Misc. Devices MISC 5-10 mLs by Mouth Rinse route every 4 (four) hours as needed (Swish and spit). Please provide patient with 1:1 Magic Mouthwash and Viscous Lidocaine (Patient not taking: Reported on 03/11/2021) 120 mL 1   nitroGLYCERIN (NITROSTAT) 0.4 MG SL tablet Place 0.4 mg under the tongue every 5 (five) minutes as needed for chest pain. (Patient not taking: Reported on 03/11/2021)     oxyCODONE (OXY IR/ROXICODONE) 5 MG immediate release tablet Take 1 tablet (5 mg total) by mouth at bedtime as needed for severe pain. (Patient not taking: Reported on 03/11/2021) 30 tablet 0   prochlorperazine (COMPAZINE) 10 MG tablet Take 1 tablet (10 mg total) by mouth every 6 (six) hours as needed (Nausea or vomiting). (Patient not taking: Reported on 03/11/2021) 30 tablet 1   No current facility-administered medications for this visit.    ALLERGIES:  Allergies  Allergen Reactions   Adhesive [Tape] Rash   Latex Rash   Tetanus Toxoids Rash   Wound Dressing Adhesive Rash    PHYSICAL EXAM:  Performance status (ECOG): 2 - Symptomatic, <50% confined to bed  Vitals:   03/11/21 0954  BP: 126/68  Pulse: 64  Resp: 18  Temp: 97.8 F (36.6 C)  SpO2: 98%   Wt Readings from Last 3 Encounters:  03/11/21 152 lb 9.6 oz (69.2 kg)  02/18/21 150 lb 8 oz (68.3 kg)  01/28/21 152 lb 3.2 oz (69 kg)   Physical Exam Vitals reviewed.  Constitutional:      Appearance: Normal appearance.     Comments: In wheelchair   Cardiovascular:     Rate and Rhythm: Normal rate and regular rhythm.     Pulses: Normal pulses.     Heart sounds: Normal heart sounds.  Pulmonary:     Effort: Pulmonary effort is normal.     Breath sounds: Normal breath sounds.  Neurological:     General: No focal deficit present.  Mental Status: She is alert and oriented to person, place, and time.  Psychiatric:        Mood and Affect: Mood normal.        Behavior: Behavior normal.    LABORATORY DATA:  I have reviewed the labs as listed.  CBC Latest Ref Rng & Units 03/11/2021 02/18/2021 01/28/2021  WBC 4.0 - 10.5 K/uL 9.1 9.2 8.8  Hemoglobin 12.0 - 15.0 g/dL 11.7(L) 12.9 12.5  Hematocrit 36.0 - 46.0 % 37.1 40.3 39.7  Platelets 150 - 400 K/uL 99(L) 120(L) 150   CMP Latest Ref Rng & Units 03/11/2021 02/18/2021 01/28/2021  Glucose 70 - 99 mg/dL 106(H) 115(H) 121(H)  BUN 8 - 23 mg/dL 25(H) 27(H) 26(H)  Creatinine 0.44 - 1.00 mg/dL 1.18(H) 1.49(H) 1.23(H)  Sodium 135 - 145 mmol/L 140 141 138  Potassium 3.5 - 5.1 mmol/L 4.3 4.5 4.1  Chloride 98 - 111 mmol/L 105 102 105  CO2 22 - 32 mmol/L _0 Calcium 8.9 - 10.3 mg/dL 8.8(L) 8.9 8.5(L)  Total Protein 6.5 - 8.1 g/dL 6.5 6.9 6.4(L)  Total Bilirubin 0.3 - 1.2 mg/dL 0.7 0.4 0.5  Alkaline Phos 38 - 126 U/L 125 125 122  AST 15 - 41 U/L 32 31 23  ALT 0 - 44 U/L _1 DIAGNOSTIC IMAGING:  I have independently reviewed the scans and discussed with the patient. NM PET Image Restag (PS) Skull Base To Thigh  Result Date: 02/14/2021 CLINICAL DATA:  Subsequent treatment strategy for non-small cell lung cancer. EXAM: NUCLEAR MEDICINE PET SKULL BASE TO THIGH TECHNIQUE: 8.14 mCi F-18 FDG was injected intravenously. Full-ring PET imaging was performed from the skull base to thigh after the radiotracer. CT data was obtained and used for attenuation correction and anatomic localization. Fasting blood glucose: 107 mg/dl COMPARISON:  CTs of the chest, abdomen and pelvis 12/16/2020.  PET-CT 07/23/2020 and 10/01/2020. right shoulder MRI 11/11/2020. FINDINGS: Mediastinal blood pool activity: SUV max 2.0 NECK: No hypermetabolic cervical lymph nodes are identified.There are no lesions of the pharyngeal mucosal space. Incidental CT findings: Extensive bilateral carotid atherosclerosis. CHEST: There are no hypermetabolic mediastinal, hilar or axillary lymph nodes. No hypermetabolic pulmonary activity or suspicious nodularity. Incidental CT findings: Dependent right pleural effusion has mildly enlarged compared with previous PET-CT, although demonstrates no associated hypermetabolic activity. There is extensive atherosclerosis of the aorta, great vessels and coronary arteries status post TAVR. Left subclavian Port-A-Cath extends to the mid SVC. Small calcified mediastinal lymph nodes are stable. Patient is status post right upper lobectomy. There are underlying emphysematous changes with scattered parenchymal scarring. Clustered nodularity in the lingula is stable, without hypermetabolic activity. ABDOMEN/PELVIS: There is no hypermetabolic activity within the liver, adrenal glands, spleen or pancreas. There is no hypermetabolic nodal activity. Incidental CT findings: Low level hypermetabolic activity associated with the aorto bi-iliac graft, improved from previous PET-CT. Underlying diffuse aortic and branch vessel atherosclerosis with stable chronic postsurgical fluid collection in the left groin. Small calcified gallstones are noted. SKELETON: There is no hypermetabolic activity to suggest osseous metastatic disease. Interval development of low-level diffuse metabolic activity throughout the bones, likely treatment related. The focal hypermetabolic lesion previously demonstrated within the right supraspinous notch has resolved. A subcutaneous nodule in the right buttock has decreased in size compared with the most recent CT, currently measuring 2.5 x 1.6 cm (previously 3.0 x 2.5 cm). This  demonstrates low level metabolic activity (SUV max 1.9, previously 10.7). Left perirectal soft tissue nodule has  decreased in size, now measuring 9 mm on image 245/3 (SUV max 1.9). The other subcutaneous nodules seen on the most recent CT have largely resolved. Incidental CT findings: none IMPRESSION: 1. Interval response to treatment in the multiple subcutaneous metastases demonstrated on previous PET-CT and diagnostic CTs. Largest remaining nodule in the subcutaneous fat of the right buttocks remains mildly hypermetabolic. 2. No evidence of local recurrence or metastatic disease within the chest, abdomen or pelvis. 3. Right pleural effusion without hypermetabolic activity. Stable lingular nodularity without hypermetabolic activity. 4. Presumed diffuse treatment changes within the bone marrow. No suspicious osseous activity. 5. Aortic Atherosclerosis (ICD10-I70.0) and Emphysema (ICD10-J43.9). Electronically Signed   By: Richardean Sale M.D.   On: 02/14/2021 17:06     ASSESSMENT:  1.  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 - She reported COVID infection in December 2020 and has been fatigued since then. - Chest x-ray on 06/21/2020 showed masslike opacity in the right lung. - CT chest with contrast on 06/28/2020 showed right upper lobe lung mass measuring 4 x 3.2 x 3.8 cm.  Small noncalcified mediastinal lymph nodes are all subcentimeter, nonspecific.  Small subpleural nodules in the right upper lobe measure up to 5 mm.  Clustered lingular nodules measuring up to 6 mm nonspecific.  Nonspecific 8 mm enhancing focus in the left lobe of the liver. - Right upper lobectomy and lymph node dissection on 08/15/2020. - Pathology consistent with 4.4 cm large cell neuroendocrine carcinoma, margins negative.  Lymph nodes at stations 7, 8, 9, 10, 11, 4R, 12 negative for metastatic disease.  No visceral pleural invasion or direct  invasion of the adjacent structures.  No lymphovascular invasion.  IHC positive for CD56, synaptophysin and TTF-1.  Chromogranin negative.  Ki-67 elevated. - PET scan on 10/01/2020 with postoperative changes in the chest wall.  Intense hyper metabolism in the acromion with SUV 8.6.  Hypermetabolic focus in the right gluteal region favoring injection site. - Foundation 1 results showed TMB high, K-ras G12C mutation positive.  STK 11 positive - PD-L1 TPS 1%. - Right lateral pelvic soft tissue biopsy on 11/27/2020 consistent with metastatic neuroendocrine carcinoma, Ki-67 40-50%. - Based on K-ras G 12 C and STK 11 positivity, we have recommended treatment like non-small cell lung cancer. - Cycle 1 of carboplatin, paclitaxel and pembrolizumab on 12/17/2020.   2.  Social/family history: - She lives by herself at home and is independent of ADLs and IADLs. - She smoked 2 packs/day for 56 years and quit in May 2012. - She worked in [employer]. - Her nephew had colon cancer.  No other malignancies in the family.   PLAN:  1.  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 - She has tolerated last cycle reasonably well. - She does not report any immunotherapy related side effects. - I have reviewed her labs today which showed normal LFTs.  Creatinine is 1.18 and stable.  Platelet count is low at 99. - We talked about maintenance Keytruda moving forward as she completed 4 cycles of chemoimmunotherapy. - She will proceed with Keytruda today.  RTC 3 weeks for follow-up.  She may discontinue magnesium.  We will check magnesium next visit.   2.  Hypothyroidism: - We started her on Synthroid 25 mcg daily 3 weeks ago. -  TSH improved to 7.2 today.  We will check TSH in 3 weeks.  3.  Nausea: - Continue Reglan 5 mg as needed.  4.  Multiple  subcutaneous nodules: - Right posterior hip skin nodule has improved.  5.  Nutrition: - Continue 4 cans of Ensure daily.     Orders placed this encounter:  No orders of the defined types were placed in this encounter.    Katelyn Jack, MD [place] [phone]   I, Thana Ates, am acting as a scribe for Dr. Derek [name].  I, Katelyn Jack MD, have reviewed the above documentation for accuracy and completeness, and I agree with the above.

## 2021-03-11 NOTE — Progress Notes (Signed)
Patient has been examined by Dr. Katragadda, and vital signs and labs have been reviewed. ANC, Creatinine, LFTs, hemoglobin, and platelets are within treatment parameters per M.D. - pt may proceed with treatment.    °

## 2021-03-12 ENCOUNTER — Ambulatory Visit (INDEPENDENT_AMBULATORY_CARE_PROVIDER_SITE_OTHER): Payer: Medicare Other | Admitting: Internal Medicine

## 2021-03-12 DIAGNOSIS — I4891 Unspecified atrial fibrillation: Secondary | ICD-10-CM | POA: Diagnosis not present

## 2021-03-12 DIAGNOSIS — Z7901 Long term (current) use of anticoagulants: Secondary | ICD-10-CM

## 2021-03-12 LAB — POCT INR: INR: 2.3 (ref 2.0–3.0)

## 2021-03-13 ENCOUNTER — Ambulatory Visit (HOSPITAL_COMMUNITY): Payer: Medicare Other

## 2021-03-17 ENCOUNTER — Other Ambulatory Visit: Payer: Self-pay | Admitting: Thoracic Surgery (Cardiothoracic Vascular Surgery)

## 2021-03-17 DIAGNOSIS — C3411 Malignant neoplasm of upper lobe, right bronchus or lung: Secondary | ICD-10-CM

## 2021-03-18 ENCOUNTER — Ambulatory Visit (INDEPENDENT_AMBULATORY_CARE_PROVIDER_SITE_OTHER): Payer: Medicare Other | Admitting: Pharmacist

## 2021-03-18 ENCOUNTER — Ambulatory Visit (INDEPENDENT_AMBULATORY_CARE_PROVIDER_SITE_OTHER): Payer: Medicare Other | Admitting: Thoracic Surgery (Cardiothoracic Vascular Surgery)

## 2021-03-18 ENCOUNTER — Other Ambulatory Visit: Payer: Self-pay

## 2021-03-18 ENCOUNTER — Ambulatory Visit
Admission: RE | Admit: 2021-03-18 | Discharge: 2021-03-18 | Disposition: A | Discharge status: Home / Self Care | Payer: Medicare Other | Source: Ambulatory Visit | Attending: Thoracic Surgery (Cardiothoracic Vascular Surgery) | Admitting: Thoracic Surgery (Cardiothoracic Vascular Surgery)

## 2021-03-18 ENCOUNTER — Encounter (HOSPITAL_COMMUNITY): Payer: Self-pay | Admitting: Hematology

## 2021-03-18 VITALS — BP 106/72 | HR 81 | Resp 20 | Ht 70.0 in | Wt 152.6 lb

## 2021-03-18 DIAGNOSIS — Z902 Acquired absence of lung [part of]: Secondary | ICD-10-CM

## 2021-03-18 DIAGNOSIS — I4891 Unspecified atrial fibrillation: Secondary | ICD-10-CM

## 2021-03-18 DIAGNOSIS — Z7901 Long term (current) use of anticoagulants: Secondary | ICD-10-CM | POA: Diagnosis not present

## 2021-03-18 DIAGNOSIS — J90 Pleural effusion, not elsewhere classified: Secondary | ICD-10-CM | POA: Diagnosis not present

## 2021-03-18 DIAGNOSIS — C3411 Malignant neoplasm of upper lobe, right bronchus or lung: Secondary | ICD-10-CM

## 2021-03-18 LAB — POCT INR: INR: 2.4 (ref 2.0–3.0)

## 2021-03-18 NOTE — Patient Instructions (Signed)
Description   Spoke with pt and instructed to continue taking warfarin 1 tablet (2.5mg ) daily. Recheck INR in 1 week. [facility] [phone]

## 2021-03-18 NOTE — Progress Notes (Signed)
[address]       [place] [address]             [phone]     HPI: Katelyn [name] returns for scheduled follow-up visit  Katelyn [name] is a 81 year old woman with a history of tobacco abuse, COPD, lung cancer, aortic stenosis, TAVR, thoracic aortic atherosclerotic disease, CAD, PAD, stroke, atrial fibrillation, hypertension, hyperlipidemia, arthritis, and gastroesophageal reflux.  She had a robotic right upper lobectomy on 08/11/2020 for a stage IIa (T2b, N0) neuroendocrine tumor.  She had a complicated postoperative course with a prolonged air leak.  She then had severe problems with weakness and nausea and initially could not do chemotherapy.  I last saw her in October and she was feeling well.  Shortly after that she saw Dr. Delton Coombes and was found to have stage IV disease.  She was treated with 4 cycles of carboplatin Taxol and Keytruda.  She currently is on Keytruda maintenance.  Currently she says she is feeling better.  She tolerated the chemotherapy well although she did lose her hair.  She is not having any pain related to her surgery.  Past Medical History:  Diagnosis Date   Allergy    rhinitis   Aortic stenosis, severe    a. s/p TAVR 08/2014.   Arthritis    spine and various joints   Cancer of upper lobe of right lung ([facility]) 09/04/2020   Carotid artery disease ([place])    Cataract    Cerebrovascular disease 12/05/2018   Childhood asthma    Chronic bronchitis ([place])        Claudication ([facility])    COPD GOLD II 07/18/2020   Coronary artery disease    a. April 2012 which revealed mid RCA occlusion with collaterals, 50% LAD stenosis, 30% circumflex stenosis, and 40% marginal stenosis   First degree AV block    GERD (gastroesophageal reflux disease)    Heart murmur    Hyperlipidemia    Hypertension    Left bundle branch block    LUMBAR RADICULOPATHY, RIGHT    PAF (paroxysmal atrial fibrillation) ([facility])    Peripheral vascular disease ([facility])    a. s/p  aortobifem bypass graft surgery and bilateral fundoplasty by [facility].   Renal artery stenosis ([place])    a. renal artery stenosis (>60% L renal artery) by duplex 04/2020.   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 bilateral subclavian stenosis with antegrade flow by duplex 07/2020    Current Outpatient Medications  Medication Sig Dispense Refill   acetaminophen (TYLENOL) 500 MG tablet Take 500 mg by mouth every 6 (six) hours as needed for moderate pain.     amiodarone (PACERONE) 200 MG tablet Take 1 tablet by mouth once daily 90 tablet 0   CARBOPLATIN IV Inject into the vein every 21 ( twenty-one) days.     diphenhydrAMINE (BENADRYL) 25 MG tablet Take 25 mg by mouth every 6 (six) hours as needed for allergies.     diphenoxylate-atropine (LOMOTIL) 2.5-0.025 MG tablet Take 2 pills at the onset of diarrhea, and then 1 tablet after each episode of diarrhea. 60 tablet 3   estradiol (ESTRACE) 2 MG tablet  Take 1 tablet (2 mg total) by mouth daily. 90 tablet 0   furosemide (LASIX) 20 MG tablet Take 1 tablet (20 mg total) by mouth daily. 30 tablet 1   ipratropium (ATROVENT) 0.06 % nasal spray USE 1 SPRAY(S) IN EACH NOSTRIL EVERY 6 HOURS AS NEEDED FOR RUNNY NOSE 15 mL 2   irbesartan (AVAPRO) 300 MG tablet Take 300 mg by mouth daily.     levothyroxine (SYNTHROID) 25 MCG tablet Take 1 tablet (25 mcg total) by mouth daily before breakfast. 30 tablet 2   lidocaine-prilocaine (EMLA) cream Apply a small amount to port a cath site and cover with plastic wrap 1 hour prior to infusion appointments 30 g 3   meloxicam (MOBIC) 15 MG tablet Take 1 tablet by mouth once daily (Patient taking differently: Take 15 mg by mouth daily as needed for pain.)  30 tablet 3   metoCLOPramide (REGLAN) 5 MG tablet Take 2 tablets (10 mg total) by mouth 3 (three) times daily before meals. 30 tablet 3   metoprolol succinate (TOPROL-XL) 25 MG 24 hr tablet Take 1 tablet by mouth once daily 90 tablet 2   Misc. Devices MISC 5-10 mLs by Mouth Rinse route every 4 (four) hours as needed (Swish and spit). Please provide patient with 1:1 Magic Mouthwash and Viscous Lidocaine 120 mL 1   nitroGLYCERIN (NITROSTAT) 0.4 MG SL tablet Place 0.4 mg under the tongue every 5 (five) minutes as needed for chest pain.     OVER THE COUNTER MEDICATION Apply 1 application topically every other day. Lantispetic cream     oxyCODONE (OXY IR/ROXICODONE) 5 MG immediate release tablet Take 1 tablet (5 mg total) by mouth at bedtime as needed for severe pain. 30 tablet 0   PACLITAXEL IV Inject into the vein every 21 ( twenty-one) days.     pantoprazole (PROTONIX) 40 MG tablet TAKE 1 TABLET BY MOUTH ONCE DAILY BEFORE SUPPER (Patient taking differently: Take 40 mg by mouth daily.) 90 tablet 3   Pembrolizumab (KEYTRUDA IV) Inject into the vein every 21 ( twenty-one) days.     potassium chloride (KLOR-CON) 10 MEQ tablet Take 1 tablet (10 mEq total) by mouth daily. 14 tablet 1   prochlorperazine (COMPAZINE) 10 MG tablet Take 1 tablet (10 mg total) by mouth every 6 (six) hours as needed (Nausea or vomiting). 30 tablet 1   rosuvastatin (CRESTOR) 20 MG tablet Take 1 tablet (20 mg total) by mouth at bedtime. 90 tablet 0   Specialty Vitamins Products (MAGNESIUM, AMINO ACID CHELATE,) 133 MG tablet Take 1 tablet by mouth 2 (two) times daily. 60 tablet 3   warfarin (COUMADIN) 2.5 MG tablet Take 1 tablet (2.5 mg total) by mouth daily. 90 tablet 0   folic acid (FOLVITE) 1 MG tablet folic acid 1 mg tablet  TAKE 1 TABLET BY MOUTH ONCE DAILY (Patient not taking: Reported on 03/18/2021)     No current facility-administered medications for this visit.    Physical Exam BP 106/72 (BP Location: Right Arm, Patient  Position: Sitting)    Pulse 81    Resp 20    Ht 5\' 10"  (1.778 m)    Wt 152 lb 9.6 oz (69.2 kg)    SpO2 96% Comment: RA   BMI 21.22 kg/m  81 year old woman in no acute distress Alert and oriented x3 with no focal deficits Alopecia Lungs diminished to right base but otherwise clear  Diagnostic Tests: CHEST - 2 VIEW   COMPARISON:  01/15/2021   FINDINGS: Heart  size and vascularity normal. TAVR. Port-A-Cath tip SVC unchanged. Surgical staples right upper lobe. Left apical pleural thickening unchanged.   Small right pleural effusion similar to the prior study. Negative for mass or adenopathy. Negative for pneumonia.   IMPRESSION: Small right pleural effusion unchanged.     Electronically Signed   By: Franchot Gallo M.D.   On: 03/18/2021 14:23 I personally reviewed the chest x-ray images.  There is been no change in her right pleural effusion.  Impression: Katelyn [name] is a 81 year old woman with a history of tobacco abuse, COPD, lung cancer, aortic stenosis, TAVR, thoracic aortic atherosclerotic disease, CAD, PAD, stroke, atrial fibrillation, hypertension, hyperlipidemia, arthritis, and gastroesophageal reflux.  Earlier this year she was found to have a right upper lobe lung nodule that was thought to be a stage IIa (T2b, N0) non-small cell carcinoma.  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  She was having a lot of nausea and vomiting initially after surgery and could not undergo chemotherapy right away.  Only a couple of months later she was found to have metastatic disease.  She has been treated with chemotherapy and is currently on Keytruda.  From a surgical standpoint she is doing well.  She does not have any pain.  Plan: I will plan to see her back in about 6 months for 1 year follow-up visit.  Melrose Nakayama, MD [facility] [phone]

## 2021-03-19 ENCOUNTER — Other Ambulatory Visit: Payer: Self-pay | Admitting: Cardiovascular Disease

## 2021-03-24 ENCOUNTER — Other Ambulatory Visit: Payer: Self-pay | Admitting: Cardiovascular Disease

## 2021-03-25 ENCOUNTER — Other Ambulatory Visit: Payer: Self-pay | Admitting: Cardiovascular Disease

## 2021-03-25 ENCOUNTER — Ambulatory Visit (INDEPENDENT_AMBULATORY_CARE_PROVIDER_SITE_OTHER): Payer: Medicare Other | Admitting: Internal Medicine

## 2021-03-25 ENCOUNTER — Other Ambulatory Visit (INDEPENDENT_AMBULATORY_CARE_PROVIDER_SITE_OTHER): Payer: Self-pay | Admitting: Nurse Practitioner

## 2021-03-25 DIAGNOSIS — E785 Hyperlipidemia, unspecified: Secondary | ICD-10-CM

## 2021-03-25 LAB — POCT INR: INR: 2.7 (ref 2.0–3.0)

## 2021-03-26 ENCOUNTER — Ambulatory Visit (INDEPENDENT_AMBULATORY_CARE_PROVIDER_SITE_OTHER): Payer: Medicare Other | Admitting: Cardiovascular Disease

## 2021-03-26 DIAGNOSIS — I4891 Unspecified atrial fibrillation: Secondary | ICD-10-CM

## 2021-03-26 DIAGNOSIS — Z7901 Long term (current) use of anticoagulants: Secondary | ICD-10-CM

## 2021-04-01 ENCOUNTER — Inpatient Hospital Stay (HOSPITAL_COMMUNITY): Payer: Medicare Other

## 2021-04-01 ENCOUNTER — Encounter: Payer: Self-pay | Admitting: Internal Medicine

## 2021-04-01 ENCOUNTER — Other Ambulatory Visit: Payer: Self-pay

## 2021-04-01 ENCOUNTER — Encounter (HOSPITAL_COMMUNITY): Payer: Self-pay | Admitting: Hematology

## 2021-04-01 ENCOUNTER — Inpatient Hospital Stay (HOSPITAL_BASED_OUTPATIENT_CLINIC_OR_DEPARTMENT_OTHER): Payer: Medicare Other | Admitting: Hematology

## 2021-04-01 VITALS — BP 173/65 | HR 57 | Temp 98.1°F | Resp 17

## 2021-04-01 VITALS — BP 122/68 | HR 56 | Temp 97.5°F | Resp 16 | Wt 151.6 lb

## 2021-04-01 DIAGNOSIS — Z5111 Encounter for antineoplastic chemotherapy: Secondary | ICD-10-CM | POA: Diagnosis not present

## 2021-04-01 DIAGNOSIS — C3411 Malignant neoplasm of upper lobe, right bronchus or lung: Secondary | ICD-10-CM

## 2021-04-01 DIAGNOSIS — Z79899 Other long term (current) drug therapy: Secondary | ICD-10-CM | POA: Diagnosis not present

## 2021-04-01 DIAGNOSIS — C7A090 Malignant carcinoid tumor of the bronchus and lung: Secondary | ICD-10-CM | POA: Diagnosis not present

## 2021-04-01 LAB — CBC WITH DIFFERENTIAL/PLATELET
Abs Immature Granulocytes: 0.02 10*3/uL (ref 0.00–0.07)
Basophils Absolute: 0.1 10*3/uL (ref 0.0–0.1)
Basophils Relative: 1 %
Eosinophils Absolute: 0.6 10*3/uL — ABNORMAL HIGH (ref 0.0–0.5)
Eosinophils Relative: 10 %
HCT: 42.1 % (ref 36.0–46.0)
Hemoglobin: 12.9 g/dL (ref 12.0–15.0)
Immature Granulocytes: 0 %
Lymphocytes Relative: 10 %
Lymphs Abs: 0.6 10*3/uL — ABNORMAL LOW (ref 0.7–4.0)
MCH: 32.6 pg (ref 26.0–34.0)
MCHC: 30.6 g/dL (ref 30.0–36.0)
MCV: 106.3 fL — ABNORMAL HIGH (ref 80.0–100.0)
Monocytes Absolute: 0.4 10*3/uL (ref 0.1–1.0)
Monocytes Relative: 7 %
Neutro Abs: 4 10*3/uL (ref 1.7–7.7)
Neutrophils Relative %: 72 %
Platelets: 93 10*3/uL — ABNORMAL LOW (ref 150–400)
RBC: 3.96 MIL/uL (ref 3.87–5.11)
RDW: 15.4 % (ref 11.5–15.5)
WBC: 5.5 10*3/uL (ref 4.0–10.5)
nRBC: 0 % (ref 0.0–0.2)

## 2021-04-01 LAB — COMPREHENSIVE METABOLIC PANEL
ALT: 24 U/L (ref 0–44)
AST: 32 U/L (ref 15–41)
Albumin: 3.4 g/dL — ABNORMAL LOW (ref 3.5–5.0)
Alkaline Phosphatase: 119 U/L (ref 38–126)
Anion gap: 6 (ref 5–15)
BUN: 20 mg/dL (ref 8–23)
CO2: 24 mmol/L (ref 22–32)
Calcium: 8.4 mg/dL — ABNORMAL LOW (ref 8.9–10.3)
Chloride: 109 mmol/L (ref 98–111)
Creatinine, Ser: 1.21 mg/dL — ABNORMAL HIGH (ref 0.44–1.00)
GFR, Estimated: 46 mL/min — ABNORMAL LOW (ref >60)
Glucose, Bld: 119 mg/dL — ABNORMAL HIGH (ref 70–99)
Potassium: 4.3 mmol/L (ref 3.5–5.1)
Sodium: 139 mmol/L (ref 135–145)
Total Bilirubin: 0.4 mg/dL (ref 0.3–1.2)
Total Protein: 6.3 g/dL — ABNORMAL LOW (ref 6.5–8.1)

## 2021-04-01 LAB — TSH: TSH: 7.013 u[IU]/mL — ABNORMAL HIGH (ref 0.350–4.500)

## 2021-04-01 LAB — MAGNESIUM: Magnesium: 1.8 mg/dL (ref 1.7–2.4)

## 2021-04-01 MED ORDER — SODIUM CHLORIDE 0.9 % IV SOLN: 200.0000 mg | Freq: Once | INTRAVENOUS | Status: DC

## 2021-04-01 MED ORDER — SODIUM CHLORIDE 0.9 % IV SOLN
200.0000 mg | Freq: Once | INTRAVENOUS | Status: AC
  Administered 2021-04-01: 200 mg via INTRAVENOUS
  Filled 2021-04-01: qty 8

## 2021-04-01 MED ORDER — SODIUM CHLORIDE 0.9 % IV SOLN: Freq: Once | INTRAVENOUS | Status: DC

## 2021-04-01 MED ORDER — HEPARIN SOD (PORK) LOCK FLUSH 100 UNIT/ML IV SOLN
500.0000 [IU] | Freq: Once | INTRAVENOUS | Status: AC | PRN
  Administered 2021-04-01: 500 [IU]

## 2021-04-01 MED ORDER — HEPARIN SOD (PORK) LOCK FLUSH 100 UNIT/ML IV SOLN: 500.0000 [IU] | Freq: Once | INTRAVENOUS | Status: DC | PRN

## 2021-04-01 MED ORDER — SODIUM CHLORIDE 0.9% FLUSH
10.0000 mL | INTRAVENOUS | Status: DC | PRN
  Administered 2021-04-01: 10 mL

## 2021-04-01 MED ORDER — SODIUM CHLORIDE 0.9 % IV SOLN: Freq: Once | INTRAVENOUS | Status: AC

## 2021-04-01 MED ORDER — SODIUM CHLORIDE 0.9% FLUSH: 10.0000 mL | INTRAVENOUS | Status: DC | PRN

## 2021-04-01 NOTE — Progress Notes (Signed)
[place], [place] [address]   CLINIC:  Medical Oncology/Hematology  PCP:  Celene Squibb, MD [address] [name] [address] [phone]   REASON FOR VISIT:  Follow-up for right lung cancer  PRIOR THERAPY: Right upper lobectomy (08/15/20)  NGS Results: not done  CURRENT THERAPY: Carboplatin, Taxol, and Keytruda every 3 weeks x 4 cycles; to be followed by Beryle Flock maintenance every 3 weeks  BRIEF ONCOLOGIC HISTORY:  Oncology History Overview Note  Foundation One PD-L1 TPS score 1 16 mut/MB KRASG12C mutation MSI stable   Cancer of upper lobe of right lung ([place])  08/15/2020 Pathology Results   FINAL MICROSCOPIC DIAGNOSIS:   A. 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  - Tumor is limited to lung.  - Margins are negative for carcinoma.  - One of one lymph nodes negative for carcinoma (0/1).  - See oncology table.   B. LYMPH NODE, 7, EXCISION:  - One of one lymph nodes negative for carcinoma (0/1).   C. LYMPH NODE, 7 #2, EXCISION:  - One of one lymph nodes negative for carcinoma (0/1).   D. LYMPH NODE, 9, EXCISION:  - One of one lymph nodes negative for carcinoma (0/1).   E. LYMPH NODE, 8, EXCISION:  - One of one lymph nodes negative for carcinoma (0/1).   F. LYMPH NODE, 7 #3, EXCISION:  - One of one lymph nodes negative for carcinoma (0/1).   G. LYMPH NODE, 11, EXCISION:  - One of one lymph nodes negative for carcinoma (0/1).   H. LYMPH NODE, 10, EXCISION:  - One of one lymph nodes negative for carcinoma (0/1).   I. LYMPH NODE, 4R, EXCISION:  - One of one lymph nodes negative for carcinoma (0/1).   J. LYMPH NODE, 10 #2, EXCISION:   - One of one lymph nodes negative for carcinoma (0/1).  - Fibrosis and calcifications.   K. LYMPH NODE, 12, EXCISION:  - One of one lymph nodes negative for carcinoma (0/1).   L. LYMPH NODE, 12 #2, EXCISION:  - One of one  lymph nodes negative for carcinoma (0/1).   ONCOLOGY TABLE:   LUNG: Resection   Synchronous Tumors: Not applicable  Total Number of Primary Tumors: 1  Procedure: Right upper lobe lobectomy with lymph node biopsies  Specimen Laterality: Right  Tumor Focality: Unifocal  Tumor Site: Right upper lobe  Tumor Size: 4.4 cm  Histologic Type: Large cell neuroendocrine carcinoma  Visceral Pleura Invasion: Not identified  Direct Invasion of Adjacent Structures: No adjacent structures present  Lymphovascular Invasion: Not identified  Margins: All margins negative for invasive carcinoma       Closest Margin(s) to Invasive Carcinoma: 1.5 cm to bronchial margin  Treatment Effect: No known presurgical therapy  Regional Lymph Nodes:       Number of Lymph Nodes Involved: 0                            Nodal Sites with Tumor: Not applicable       Number of Lymph Nodes Examined: 12                       Nodal Sites Examined: 4, 7, 8, 9, 10, 11, 12  Distant Metastasis:       Distant Site(s) Involved: Not applicable  Pathologic Stage Classification (pTNM, AJCC 8th  Edition): pT2b, pN0  Ancillary Studies: Can be performed upon request  Representative Tumor Block: A2  Comment(s): The tumor has a subtle neuroendocrine features, cribriform areas with necrosis, and geographic areas of necrosis.  Immunohistochemistry is positive for CD56, synaptophysin, and TTF-1.  Chromogranin is largely negative. Ki-67 is elevated. Dr. Saralyn Pilar has reviewed the case.     08/15/2020 Surgery   PREOPERATIVE DIAGNOSIS:  Right upper lobe lung mass, suspected non-small cell carcinoma.   POSTOPERATIVE DIAGNOSIS:  Non-small cell carcinoma, right upper lobe, clinical stage IB (T2, N0).   PROCEDURE:   Xi robotic-assisted right upper lobectomy,  Lymph node dissection,  Intercostal nerve blocks levels 3 through 10.   SURGEON:  Modesto Charon, MD    FINDINGS:  Fragile tissue.  Enlarged but otherwise benign appearing nodes.   One calcified node.  Frozen section revealed non-small cell carcinoma.  Bronchial margin clear.   09/04/2020 Initial Diagnosis   Cancer of upper lobe of right lung ([place])   09/04/2020 Cancer Staging   Staging form: Lung, AJCC 8th Edition - Pathologic stage from 09/04/2020: pT2, pN0, cM0 - Signed by Heath Lark, MD on 09/04/2020 Stage prefix: Initial diagnosis    09/11/2020 Genetic Testing   PD-L1 Results:     12/11/2020 Cancer Staging   Staging form: Lung, AJCC 8th Edition - Pathologic stage from 12/11/2020: Stage IVB (pT2b, pN0, pM1c) - Signed by Derek Jack, MD on 12/11/2020 Histopathologic type: Large cell neuroendocrine carcinoma    12/17/2020 -  Chemotherapy   Patient is on Treatment Plan : LUNG NSCLC Carboplatin + Paclitaxel + Pembrolizumab q21d x 4 cycles / Pembrolizumab Maintenance Q21D       CANCER STAGING:  Cancer Staging  Cancer of upper lobe of right lung [facility]) Staging form: Lung, AJCC 8th Edition - Pathologic stage from 09/04/2020: pT2, pN0, cM0 - Signed by Heath Lark, MD on 09/04/2020 - Pathologic stage from 12/11/2020: Stage IVB (pT2b, pN0, pM1c) - Signed by Derek Jack, MD on 12/11/2020   INTERVAL HISTORY:  Katelyn [name], a 81 y.o. female, returns for routine follow-up and consideration for next cycle of chemotherapy. Anija was last seen on 03/11/2021.  Due for cycle #6 of Keytruda today.   Overall, she tells me she has been feeling pretty well. She denies n/v/d. She reports a new small bump on the right side of her face. She has not been taking synthroid. Her cough is stable. She reports ankle swellings.   Overall, she feels ready for next cycle of chemo today.   REVIEW OF SYSTEMS:  Review of Systems  Constitutional:  Positive for fatigue. Negative for appetite change.  Respiratory:  Negative for cough.   Cardiovascular:  Positive for leg swelling (ankle).  Gastrointestinal:  Negative for diarrhea, nausea and vomiting.  All other systems  reviewed and are negative.  PAST MEDICAL/SURGICAL HISTORY:  Past Medical History:  Diagnosis Date   Allergy    rhinitis   Aortic stenosis, severe    a. s/p TAVR 08/2014.   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 April 2012 which revealed mid RCA occlusion with collaterals, 50% LAD stenosis, 30% circumflex stenosis,  and 40% marginal stenosis   First degree AV block    GERD (gastroesophageal reflux disease)    Heart murmur    Hyperlipidemia    Hypertension    Left bundle branch block    LUMBAR RADICULOPATHY, RIGHT    PAF (paroxysmal atrial fibrillation) (HCC)    Peripheral vascular disease (HCC)    a. s/p aortobifem bypass graft surgery and bilateral fundoplasty by [facility].   Renal artery stenosis (HCC)    a. renal artery stenosis (>60% L renal artery) by duplex 04/2020.   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 bilateral subclavian stenosis with antegrade flow by duplex 07/2020   Past Surgical History:  Procedure Laterality Date   AORTO-FEMORAL BYPASS GRAFT Bilateral 06/27/10   BASAL CELL CARCINOMA EXCISION Left    face   CARDIAC CATHETERIZATION N/A 07/25/2014   Procedure: Right/Left Heart Cath and Coronary Angiography;  Surgeon: Lennette Bihari, MD;  [facility]: [facility] INV[facility];  Service: Cardiovascular;  Laterality: N/A;   CATARACT EXTRACTION, BILATERAL Bilateral 09/29/2016   COLONOSCOPY  2008   Dr. Karilyn Cota: normal.    DILATION AND CURETTAGE  OF UTERUS  "2 or 3"   ESOPHAGOGASTRODUODENOSCOPY N/A 08/18/2017   Dr. Jena Gauss: Normal-appearing esophagus status post dilation.  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  Small hiatal hernia.   INTERCOSTAL NERVE BLOCK Right 08/15/2020   Procedure: INTERCOSTAL NERVE BLOCK RIGHT;  Surgeon: Loreli Slot, MD;  Location: [facility] OR;  Service: Thoracic;  Laterality: Right;   IR THORACENTESIS ASP PLEURAL SPACE W/IMG GUIDE  10/07/2020   MALONEY DILATION N/A 08/18/2017   Procedure: Elease Hashimoto DILATION;  Surgeon: Corbin Ade, MD;  Location: [facility] ENDO SUITE;  Service: Endoscopy;  Laterality: N/A;   MULTIPLE TOOTH EXTRACTIONS     NODE DISSECTION Right 08/15/2020   Procedure: NODE DISSECTION;  Surgeon: Loreli Slot, MD;  Location: [facility] OR;  Service: Thoracic;  Laterality: Right;   PORTACATH PLACEMENT Left 01/15/2021   Procedure: INSERTION PORT-A-CATH;  Surgeon: Franky Macho, MD;  Location: [facility] ORS;  Service: General;  Laterality: Left;   PR VEIN BYPASS GRAFT,AORTO-FEM-POP  06/12/10   TEE WITHOUT CARDIOVERSION N/A 08/31/2014   Procedure: TRANSESOPHAGEAL ECHOCARDIOGRAM (TEE);  Surgeon: Purcell Nails, MD;  Location: [facility] OR;  Service: Open Heart Surgery;  Laterality: N/A;   TRANSCATHETER AORTIC VALVE REPLACEMENT, TRANSFEMORAL N/A 08/31/2014   Procedure: TRANSCATHETER AORTIC VALVE REPLACEMENT, TRANSFEMORAL approach;  Surgeon: Purcell Nails, MD;  Location: [facility] OR;  Service: Open Heart Surgery;  Laterality: N/A;   TUBAL LIGATION  1970's   VAGINAL HYSTERECTOMY  1970's   Partial     SOCIAL HISTORY:  Social History   Socioeconomic History   Marital status: Widowed    Spouse name: bobby   Number of children: 1   Years of education: Not on file   Highest education level: High school graduate  Occupational History   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 Retired,used to work at [employer].Lives alone.         Patient has one daughter, 3 grands   Likes to crochet   Works in [employer].    Social Determinants of Health   Financial Resource Strain: Not on file  Food Insecurity: No Food Insecurity   Worried About Charity fundraiser in the Last Year: Never true   Ran Out of Food in the Last Year: Never true  Transportation Needs: No Transportation Needs   Lack of Transportation (Medical): No   Lack of Transportation (Non-Medical): No  Physical Activity: Not on file  Stress: Not on file  Social Connections: Not on file  Intimate Partner Violence: Not on file    FAMILY HISTORY:  Family History  Problem Relation Age of Onset   Heart disease Mother 43       died young   Varicose Veins Mother    Stroke Father    Hypertension Father    Heart disease Father 26       Aneurysm   Heart attack Father    Arthritis Brother    Stroke Brother    Diabetes Daughter    Arthritis Daughter    Heart attack Brother        unsure   Allergic rhinitis Neg Hx    Angioedema Neg Hx    Asthma Neg Hx    Atopy Neg Hx    Eczema Neg Hx    Immunodeficiency Neg Hx    Urticaria Neg Hx     CURRENT MEDICATIONS:  Current Outpatient Medications  Medication Sig Dispense Refill   acetaminophen (TYLENOL) 500 MG tablet Take 500 mg by mouth every 6 (six) hours as needed for moderate pain.     amiodarone (PACERONE) 200 MG tablet Take 1 tablet by mouth once daily 90 tablet 0   CARBOPLATIN IV Inject into the vein every 21 ( twenty-one) days.     diphenhydrAMINE (BENADRYL) 25 MG tablet Take 25 mg by mouth every 6 (six) hours as  needed for allergies.     diphenoxylate-atropine (LOMOTIL) 2.5-0.025 MG tablet Take 2 pills at the onset of diarrhea, and then 1 tablet after each episode of diarrhea. 60 tablet 3   estradiol (ESTRACE) 2 MG tablet Take 1 tablet (2 mg total) by mouth daily. 90 tablet 0   furosemide (LASIX) 20 MG tablet Take 1 tablet (20 mg total) by mouth daily. 30 tablet 1   ipratropium (ATROVENT) 0.06 % nasal spray USE 1 SPRAY(S) IN EACH NOSTRIL EVERY 6 HOURS AS NEEDED FOR RUNNY NOSE 15 mL 2   irbesartan (AVAPRO) 300 MG tablet Take 1 tablet by mouth once daily 90 tablet 0   levothyroxine (SYNTHROID) 25 MCG tablet Take 1 tablet (25 mcg total) by mouth daily before breakfast. 30 tablet 2   lidocaine-prilocaine (EMLA) cream Apply a small amount to port a cath site and cover with plastic wrap 1 hour prior to infusion appointments 30 g 3   meloxicam (MOBIC) 15 MG tablet Take 1 tablet by mouth once daily (Patient taking differently: Take 15 mg by mouth daily as needed for pain.)  30 tablet 3   metoCLOPramide (REGLAN) 5 MG tablet Take 2 tablets (10 mg total) by mouth 3 (three) times daily before meals. 30 tablet 3   metoprolol succinate (TOPROL-XL) 25 MG 24 hr tablet Take 1 tablet by mouth once daily 90 tablet 1   nitroGLYCERIN (NITROSTAT) 0.4 MG SL tablet Place 0.4 mg under the tongue every 5 (five) minutes as needed for chest pain.     OVER THE COUNTER MEDICATION Apply 1 application topically every other day. Lantispetic cream     oxyCODONE (OXY IR/ROXICODONE) 5 MG immediate release tablet Take 1 tablet (5 mg total) by mouth at bedtime as needed for severe pain. 30 tablet 0   PACLITAXEL IV Inject into the vein every 21 ( twenty-one) days.     pantoprazole (PROTONIX) 40 MG tablet TAKE 1 TABLET BY MOUTH ONCE DAILY BEFORE SUPPER (Patient taking differently: Take 40 mg by mouth daily.) 90 tablet 3   Pembrolizumab (KEYTRUDA IV) Inject into the vein every 21 ( twenty-one) days.     potassium chloride (KLOR-CON) 10 MEQ tablet  Take 1 tablet (10 mEq total) by mouth daily. 14 tablet 1   prochlorperazine (COMPAZINE) 10 MG tablet Take 1 tablet (10 mg total) by mouth every 6 (six) hours as needed (Nausea or vomiting). 30 tablet 1   rosuvastatin (CRESTOR) 20 MG tablet Take 1 tablet (20 mg total) by mouth at bedtime. 90 tablet 0   Specialty Vitamins Products (MAGNESIUM, AMINO ACID CHELATE,) 133 MG tablet Take 1 tablet by mouth 2 (two) times daily. 60 tablet 3   warfarin (COUMADIN) 2.5 MG tablet Take 1 tablet (2.5 mg total) by mouth daily. 90 tablet 0   No current facility-administered medications for this visit.    ALLERGIES:  Allergies  Allergen Reactions   Adhesive [Tape] Rash   Latex Rash   Tetanus Toxoids Rash   Wound Dressing Adhesive Rash    PHYSICAL EXAM:  Performance status (ECOG): 2 - Symptomatic, <50% confined to bed  Vitals:   04/01/21 1037  BP: 122/68  Pulse: (!) 56  Resp: 16  Temp: (!) 97.5 F (36.4 C)  SpO2: 94%   Wt Readings from Last 3 Encounters:  04/01/21 151 lb 9.6 oz (68.8 kg)  03/18/21 152 lb 9.6 oz (69.2 kg)  03/11/21 152 lb 9.6 oz (69.2 kg)   Physical Exam Vitals reviewed.  Constitutional:      Appearance: Normal appearance.  Cardiovascular:     Rate and Rhythm: Normal rate and regular rhythm.     Pulses: Normal pulses.     Heart sounds: Normal heart sounds.  Pulmonary:     Effort: Pulmonary effort is normal.     Breath sounds: Normal breath sounds.  Musculoskeletal:     Right lower leg: 1+ Edema present.     Left lower leg: 1+ Edema present.  Skin:    Findings: No erythema.     Comments: 1 cm L hip nodule  Neurological:     General: No focal deficit present.     Mental Status: She is alert and oriented to person, place, and time.  Psychiatric:        Mood and Affect: Mood normal.        Behavior: Behavior normal.    LABORATORY DATA:  I have reviewed the labs as listed.  CBC Latest Ref Rng & Units 04/01/2021 03/11/2021 02/18/2021  WBC 4.0 - 10.5 K/uL 5.5 9.1 9.2   Hemoglobin 12.0 - 15.0 g/dL 12.9 11.7(L) 12.9  Hematocrit  36.0 - 46.0 % 42.1 37.1 40.3  Platelets 150 - 400 K/uL 93(L) 99(L) 120(L)   CMP Latest Ref Rng & Units 04/01/2021 03/11/2021 02/18/2021  Glucose 70 - 99 mg/dL 119(H) 106(H) 115(H)  BUN 8 - 23 mg/dL 20 25(H) 27(H)  Creatinine 0.44 - 1.00 mg/dL 1.21(H) 1.18(H) 1.49(H)  Sodium 135 - 145 mmol/L 139 140 141  Potassium 3.5 - 5.1 mmol/L 4.3 4.3 4.5  Chloride 98 - 111 mmol/L 109 105 102  CO2 22 - 32 mmol/L $RemoveB'24 28 29  'qKZdaNRM$ Calcium 8.9 - 10.3 mg/dL 8.4(L) 8.8(L) 8.9  Total Protein 6.5 - 8.1 g/dL 6.3(L) 6.5 6.9  Total Bilirubin 0.3 - 1.2 mg/dL 0.4 0.7 0.4  Alkaline Phos 38 - 126 U/L 119 125 125  AST 15 - 41 U/L 32 32 31  ALT 0 - 44 U/L $Remo'24 25 21    'ItYLi$ DIAGNOSTIC IMAGING:  I have independently reviewed the scans and discussed with the patient. DG Chest 2 View  Result Date: 03/18/2021 CLINICAL DATA:  Lung cancer EXAM: CHEST - 2 VIEW COMPARISON:  01/15/2021 FINDINGS: Heart size and vascularity normal. TAVR. Port-A-Cath tip SVC unchanged. Surgical staples right upper lobe. Left apical pleural thickening unchanged. Small right pleural effusion similar to the prior study. Negative for mass or adenopathy. Negative for pneumonia. IMPRESSION: Small right pleural effusion unchanged. Electronically Signed   By: Franchot Gallo M.D.   On: 03/18/2021 14:23     ASSESSMENT:  1.  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 - She reported COVID infection in December 2020 and has been fatigued since then. - Chest x-ray on 06/21/2020 showed masslike opacity in the right lung. - CT chest with contrast on 06/28/2020 showed right upper lobe lung mass measuring 4 x 3.2 x 3.8 cm.  Small noncalcified mediastinal lymph nodes are all subcentimeter, nonspecific.  Small subpleural nodules in the right upper lobe measure up to 5 mm.  Clustered lingular nodules measuring up to 6 mm nonspecific.   Nonspecific 8 mm enhancing focus in the left lobe of the liver. - Right upper lobectomy and lymph node dissection on 08/15/2020. - Pathology consistent with 4.4 cm large cell neuroendocrine carcinoma, margins negative.  Lymph nodes at stations 7, 8, 9, 10, 11, 4R, 12 negative for metastatic disease.  No visceral pleural invasion or direct invasion of the adjacent structures.  No lymphovascular invasion.  IHC positive for CD56, synaptophysin and TTF-1.  Chromogranin negative.  Ki-67 elevated. - PET scan on 10/01/2020 with postoperative changes in the chest wall.  Intense hyper metabolism in the acromion with SUV 8.6.  Hypermetabolic focus in the right gluteal region favoring injection site. - Foundation 1 results showed TMB high, K-ras G12C mutation positive.  STK 11 positive - PD-L1 TPS 1%. - Right lateral pelvic soft tissue biopsy on 11/27/2020 consistent with metastatic neuroendocrine carcinoma, Ki-67 40-50%. - Based on K-ras G 12 C and STK 11 positivity, we have recommended treatment like non-small cell lung cancer. - Cycle 1 of carboplatin, paclitaxel and pembrolizumab on 12/17/2020.   2.  Social/family history: - She lives by herself at home and is independent of ADLs and IADLs. - She smoked 2 packs/day for 56 years and quit in May 2012. - She worked in [employer]. - Her nephew had colon cancer.  No other malignancies in the family.   PLAN:  1.  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 - She does not report any immunotherapy related side effects. - Reviewed labs today which showed normal LFTs and CBC.  Platelet count is low at 93.  We will closely monitor. - Recommend proceeding with next cycle today.  RTC 3 weeks. - Plan to repeat PET scan and MRI of the brain in 6 weeks.   2.  Hypothyroidism: - She reportedly stopped  taking Synthroid 3 weeks back.  TSH today is 7.01. - I have recommended her to start back on Synthroid 25 mcg daily.  3.  Nausea: - Continue Reglan 5 mg as needed.  4.  Multiple subcutaneous nodules: - Left hip skin nodule measures about 1 cm without erythema.  5.  Nutrition: - Continue 4 cans of Ensure daily.   Orders placed this encounter:  No orders of the defined types were placed in this encounter.    Derek Jack, MD [place] [phone]   I, Thana Ates, am acting as a scribe for Dr. Derek Jack.  I, Derek Jack MD, have reviewed the above documentation for accuracy and completeness, and I agree with the above.

## 2021-04-01 NOTE — Progress Notes (Signed)
Patient has been examined by Dr. Katragadda, and vital signs and labs have been reviewed. ANC, Creatinine, LFTs, hemoglobin, and platelets are within treatment parameters per M.D. - pt may proceed with treatment.    °

## 2021-04-01 NOTE — Patient Instructions (Signed)
[facility]  Discharge Instructions: Thank you for choosing [facility] to provide your oncology and hematology care.  If you have a lab appointment with the [facility], please come in thru the Main Entrance and check in at the main information desk.  Wear comfortable clothing and clothing appropriate for easy access to any Portacath or PICC line.   We strive to give you quality time with your provider. You may need to reschedule your appointment if you arrive late (15 or more minutes).  Arriving late affects you and other patients whose appointments are after yours.  Also, if you miss three or more appointments without notifying the office, you may be dismissed from the clinic at the providers discretion.      For prescription refill requests, have your pharmacy contact our office and allow 72 hours for refills to be completed.    Today you received the following chemotherapy and/or immunotherapy agents Keytruda.   To help prevent nausea and vomiting after your treatment, we encourage you to take your nausea medication as directed.  BELOW ARE SYMPTOMS THAT SHOULD BE REPORTED IMMEDIATELY: *FEVER GREATER THAN 100.4 F (38 C) OR HIGHER *CHILLS OR SWEATING *NAUSEA AND VOMITING THAT IS NOT CONTROLLED WITH YOUR NAUSEA MEDICATION *UNUSUAL SHORTNESS OF BREATH *UNUSUAL BRUISING OR BLEEDING *URINARY PROBLEMS (pain or burning when urinating, or frequent urination) *BOWEL PROBLEMS (unusual diarrhea, constipation, pain near the anus) TENDERNESS IN MOUTH AND THROAT WITH OR WITHOUT PRESENCE OF ULCERS (sore throat, sores in mouth, or a toothache) UNUSUAL RASH, SWELLING OR PAIN  UNUSUAL VAGINAL DISCHARGE OR ITCHING   Items with * indicate a potential emergency and should be followed up as soon as possible or go to the Emergency Department if any problems should occur.  Please show the CHEMOTHERAPY ALERT CARD or IMMUNOTHERAPY ALERT CARD at check-in to the Emergency  Department and triage nurse.  Should you have questions after your visit or need to cancel or reschedule your appointment, please contact [facility] [phone]  and follow the prompts.  Office hours are 8:00 a.m. to 4:30 p.m. Monday - Friday. Please note that voicemails left after 4:00 p.m. may not be returned until the following business day.  We are closed weekends and major holidays. You have access to a nurse at all times for urgent questions. Please call the main number to the clinic [phone] and follow the prompts.  For any non-urgent questions, you may also contact your provider using MyChart. We now offer e-Visits for anyone 50 and older to request care online for non-urgent symptoms. For details visit [email or web address].   Also download the MyChart app! Go to the app store, search "MyChart", open the app, select [facility], and log in with your MyChart username and password.  Due to Covid, a mask is required upon entering the hospital/clinic. If you do not have a mask, one will be given to you upon arrival. For doctor visits, patients may have 1 support person aged 53 or older with them. For treatment visits, patients cannot have anyone with them due to current Covid guidelines and our immunocompromised population.

## 2021-04-01 NOTE — Progress Notes (Signed)
Pt presents today for Keytruda per provider's order. Vitals signs and other labs WNL for treatment. Pt voiced no complaints at this time.  Keytruda given today per MD orders. Tolerated infusion without adverse affects. Vital signs stable. No complaints at this time. Discharged from clinic via wheelchair in stable condition. Alert and oriented x 3. F/U with [facility] as scheduled.

## 2021-04-01 NOTE — Patient Instructions (Addendum)
[place] at [facility] Discharge Instructions   You were seen and examined today by Dr. Delton Coombes.  He reviewed the results of your lab work which is normal/stable.    You need to resume taking your thyroid medication every morning on an empty stomach.  Wait 1 hour prior to having a meal after taking.  We will proceed with your treatment today.  Return as scheduled for lab work and treatment.    Thank you for choosing [place] at [facility] to provide your oncology and hematology care.  To afford each patient quality time with our provider, please arrive at least 15 minutes before your scheduled appointment time.   If you have a lab appointment with the [facility] please come in thru the Main Entrance and check in at the main information desk.  You need to re-schedule your appointment should you arrive 10 or more minutes late.  We strive to give you quality time with our providers, and arriving late affects you and other patients whose appointments are after yours.  Also, if you no show three or more times for appointments you may be dismissed from the clinic at the providers discretion.     Again, thank you for choosing [facility].  Our hope is that these requests will decrease the amount of time that you wait before being seen by our physicians.       _____________________________________________________________  Should you have questions after your visit to [facility], please contact our office at [phone] and follow the prompts.  Our office hours are 8:00 a.m. and 4:30 p.m. Monday - Friday.  Please note that voicemails left after 4:00 p.m. may not be returned until the following business day.  We are closed weekends and major holidays.  You do have access to a nurse 24-7, just call the main number to the clinic [phone] and do not press any options, hold on the line and a nurse will answer the  phone.    For prescription refill requests, have your pharmacy contact our office and allow 72 hours.    Due to Covid, you will need to wear a mask upon entering the hospital. If you do not have a mask, a mask will be given to you at the Main Entrance upon arrival. For doctor visits, patients may have 1 support person age 80 or older with them. For treatment visits, patients can not have anyone with them due to social distancing guidelines and our immunocompromised population.

## 2021-04-02 ENCOUNTER — Ambulatory Visit (INDEPENDENT_AMBULATORY_CARE_PROVIDER_SITE_OTHER): Payer: Medicare Other | Admitting: Cardiovascular Disease

## 2021-04-02 ENCOUNTER — Other Ambulatory Visit (HOSPITAL_COMMUNITY): Payer: Self-pay | Admitting: Hematology

## 2021-04-02 DIAGNOSIS — Z7901 Long term (current) use of anticoagulants: Secondary | ICD-10-CM

## 2021-04-02 LAB — POCT INR: INR: 2.6 (ref 2.0–3.0)

## 2021-04-03 ENCOUNTER — Encounter (HOSPITAL_COMMUNITY): Payer: Self-pay | Admitting: Hematology

## 2021-04-08 LAB — POCT INR: INR: 2.5 (ref 2.0–3.0)

## 2021-04-09 ENCOUNTER — Ambulatory Visit (INDEPENDENT_AMBULATORY_CARE_PROVIDER_SITE_OTHER): Payer: Medicare Other | Admitting: Cardiovascular Disease

## 2021-04-09 DIAGNOSIS — Z7901 Long term (current) use of anticoagulants: Secondary | ICD-10-CM | POA: Diagnosis not present

## 2021-04-15 DIAGNOSIS — Z7901 Long term (current) use of anticoagulants: Secondary | ICD-10-CM | POA: Diagnosis not present

## 2021-04-15 LAB — POCT INR: INR: 2.8 (ref 2.0–3.0)

## 2021-04-16 ENCOUNTER — Ambulatory Visit (INDEPENDENT_AMBULATORY_CARE_PROVIDER_SITE_OTHER): Payer: Medicare Other | Admitting: Cardiology

## 2021-04-16 DIAGNOSIS — Z7901 Long term (current) use of anticoagulants: Secondary | ICD-10-CM

## 2021-04-18 ENCOUNTER — Other Ambulatory Visit: Payer: Self-pay | Admitting: Cardiovascular Disease

## 2021-04-22 ENCOUNTER — Ambulatory Visit (INDEPENDENT_AMBULATORY_CARE_PROVIDER_SITE_OTHER): Payer: Medicare Other | Admitting: Cardiovascular Disease

## 2021-04-22 ENCOUNTER — Inpatient Hospital Stay (HOSPITAL_COMMUNITY): Payer: Medicare Other

## 2021-04-22 ENCOUNTER — Other Ambulatory Visit: Payer: Self-pay

## 2021-04-22 ENCOUNTER — Inpatient Hospital Stay (HOSPITAL_COMMUNITY): Payer: Medicare Other | Attending: Hematology | Admitting: Hematology

## 2021-04-22 VITALS — BP 160/69 | HR 55 | Temp 97.8°F | Resp 18 | Ht 70.0 in | Wt 150.6 lb

## 2021-04-22 VITALS — BP 182/73 | HR 59 | Temp 98.4°F | Resp 18

## 2021-04-22 DIAGNOSIS — C3411 Malignant neoplasm of upper lobe, right bronchus or lung: Secondary | ICD-10-CM | POA: Diagnosis not present

## 2021-04-22 DIAGNOSIS — Z79899 Other long term (current) drug therapy: Secondary | ICD-10-CM | POA: Diagnosis not present

## 2021-04-22 DIAGNOSIS — Z5111 Encounter for antineoplastic chemotherapy: Secondary | ICD-10-CM | POA: Insufficient documentation

## 2021-04-22 DIAGNOSIS — C7A090 Malignant carcinoid tumor of the bronchus and lung: Secondary | ICD-10-CM | POA: Diagnosis not present

## 2021-04-22 DIAGNOSIS — Z5181 Encounter for therapeutic drug level monitoring: Secondary | ICD-10-CM

## 2021-04-22 LAB — CBC WITH DIFFERENTIAL/PLATELET
Abs Immature Granulocytes: 0.02 10*3/uL (ref 0.00–0.07)
Basophils Absolute: 0.1 10*3/uL (ref 0.0–0.1)
Basophils Relative: 1 %
Eosinophils Absolute: 1.1 10*3/uL — ABNORMAL HIGH (ref 0.0–0.5)
Eosinophils Relative: 16 %
HCT: 45.1 % (ref 36.0–46.0)
Hemoglobin: 14.4 g/dL (ref 12.0–15.0)
Immature Granulocytes: 0 %
Lymphocytes Relative: 8 %
Lymphs Abs: 0.6 10*3/uL — ABNORMAL LOW (ref 0.7–4.0)
MCH: 33.3 pg (ref 26.0–34.0)
MCHC: 31.9 g/dL (ref 30.0–36.0)
MCV: 104.4 fL — ABNORMAL HIGH (ref 80.0–100.0)
Monocytes Absolute: 0.5 10*3/uL (ref 0.1–1.0)
Monocytes Relative: 8 %
Neutro Abs: 4.7 10*3/uL (ref 1.7–7.7)
Neutrophils Relative %: 67 %
Platelets: 88 10*3/uL — ABNORMAL LOW (ref 150–400)
RBC: 4.32 MIL/uL (ref 3.87–5.11)
RDW: 13.8 % (ref 11.5–15.5)
WBC: 7 10*3/uL (ref 4.0–10.5)
nRBC: 0 % (ref 0.0–0.2)

## 2021-04-22 LAB — COMPREHENSIVE METABOLIC PANEL
ALT: 30 U/L (ref 0–44)
AST: 55 U/L — ABNORMAL HIGH (ref 15–41)
Albumin: 3.2 g/dL — ABNORMAL LOW (ref 3.5–5.0)
Alkaline Phosphatase: 177 U/L — ABNORMAL HIGH (ref 38–126)
Anion gap: 6 (ref 5–15)
BUN: 23 mg/dL (ref 8–23)
CO2: 26 mmol/L (ref 22–32)
Calcium: 8.7 mg/dL — ABNORMAL LOW (ref 8.9–10.3)
Chloride: 107 mmol/L (ref 98–111)
Creatinine, Ser: 1.24 mg/dL — ABNORMAL HIGH (ref 0.44–1.00)
GFR, Estimated: 45 mL/min — ABNORMAL LOW (ref >60)
Glucose, Bld: 105 mg/dL — ABNORMAL HIGH (ref 70–99)
Potassium: 4 mmol/L (ref 3.5–5.1)
Sodium: 139 mmol/L (ref 135–145)
Total Bilirubin: 0.5 mg/dL (ref 0.3–1.2)
Total Protein: 6.3 g/dL — ABNORMAL LOW (ref 6.5–8.1)

## 2021-04-22 LAB — POCT INR: INR: 2.8 (ref 2.0–3.0)

## 2021-04-22 LAB — TSH: TSH: 7.327 u[IU]/mL — ABNORMAL HIGH (ref 0.350–4.500)

## 2021-04-22 LAB — MAGNESIUM: Magnesium: 1.7 mg/dL (ref 1.7–2.4)

## 2021-04-22 MED ORDER — SODIUM CHLORIDE 0.9 % IV SOLN
200.0000 mg | Freq: Once | INTRAVENOUS | Status: AC
  Administered 2021-04-22: 200 mg via INTRAVENOUS
  Filled 2021-04-22: qty 8

## 2021-04-22 MED ORDER — HEPARIN SOD (PORK) LOCK FLUSH 100 UNIT/ML IV SOLN
500.0000 [IU] | Freq: Once | INTRAVENOUS | Status: AC | PRN
  Administered 2021-04-22: 500 [IU]

## 2021-04-22 MED ORDER — LEVOTHYROXINE SODIUM 50 MCG PO TABS: 50.0000 ug | ORAL_TABLET | Freq: Every day | ORAL | 3 refills | Status: AC

## 2021-04-22 MED ORDER — SODIUM CHLORIDE 0.9% FLUSH
10.0000 mL | INTRAVENOUS | Status: DC | PRN
  Administered 2021-04-22: 10 mL

## 2021-04-22 MED ORDER — SODIUM CHLORIDE 0.9 % IV SOLN: Freq: Once | INTRAVENOUS | Status: AC

## 2021-04-22 NOTE — Progress Notes (Signed)
Patient has been examined by Dr. Katragadda, and vital signs and labs have been reviewed. ANC, Creatinine, LFTs, hemoglobin, and platelets are within treatment parameters per M.D. - pt may proceed with treatment.    °

## 2021-04-22 NOTE — Patient Instructions (Signed)
[place]  Discharge Instructions: Thank you for choosing [place] to provide your oncology and hematology care.  If you have a lab appointment with the [facility], please come in thru the Main Entrance and check in at the main information desk.  Wear comfortable clothing and clothing appropriate for easy access to any Portacath or PICC line.   We strive to give you quality time with your provider. You may need to reschedule your appointment if you arrive late (15 or more minutes).  Arriving late affects you and other patients whose appointments are after yours.  Also, if you miss three or more appointments without notifying the office, you may be dismissed from the clinic at the providers discretion.      For prescription refill requests, have your pharmacy contact our office and allow 72 hours for refills to be completed.    Today you received the following chemotherapy and/or immunotherapy agents Keytruda, return as scheduled.   To help prevent nausea and vomiting after your treatment, we encourage you to take your nausea medication as directed.  BELOW ARE SYMPTOMS THAT SHOULD BE REPORTED IMMEDIATELY: *FEVER GREATER THAN 100.4 F (38 C) OR HIGHER *CHILLS OR SWEATING *NAUSEA AND VOMITING THAT IS NOT CONTROLLED WITH YOUR NAUSEA MEDICATION *UNUSUAL SHORTNESS OF BREATH *UNUSUAL BRUISING OR BLEEDING *URINARY PROBLEMS (pain or burning when urinating, or frequent urination) *BOWEL PROBLEMS (unusual diarrhea, constipation, pain near the anus) TENDERNESS IN MOUTH AND THROAT WITH OR WITHOUT PRESENCE OF ULCERS (sore throat, sores in mouth, or a toothache) UNUSUAL RASH, SWELLING OR PAIN  UNUSUAL VAGINAL DISCHARGE OR ITCHING   Items with * indicate a potential emergency and should be followed up as soon as possible or go to the Emergency Department if any problems should occur.  Please show the CHEMOTHERAPY ALERT CARD or IMMUNOTHERAPY ALERT CARD at check-in to  the Emergency Department and triage nurse.  Should you have questions after your visit or need to cancel or reschedule your appointment, please contact [facility] [phone]  and follow the prompts.  Office hours are 8:00 a.m. to 4:30 p.m. Monday - Friday. Please note that voicemails left after 4:00 p.m. may not be returned until the following business day.  We are closed weekends and major holidays. You have access to a nurse at all times for urgent questions. Please call the main number to the clinic [phone] and follow the prompts.  For any non-urgent questions, you may also contact your provider using MyChart. We now offer e-Visits for anyone 79 and older to request care online for non-urgent symptoms. For details visit [email or web address].   Also download the MyChart app! Go to the app store, search "MyChart", open the app, select [facility], and log in with your MyChart username and password.  Due to Covid, a mask is required upon entering the hospital/clinic. If you do not have a mask, one will be given to you upon arrival. For doctor visits, patients may have 1 support person aged 79 or older with them. For treatment visits, patients cannot have anyone with them due to current Covid guidelines and our immunocompromised population.

## 2021-04-22 NOTE — Patient Instructions (Signed)
[place] at [facility] Discharge Instructions   You were seen and examined today by Dr. Delton Coombes.  He reviewed your lab work which was mostly normal/stable.  Your thyroid function is still elevated.  We will increase your dose of Synthroid to 50 mcg daily 1 hour before breakfast.  You may take 2 of the pills you have on hand until they run out, but we have also sent a new prescription to your pharmacy.   We will proceed with treatment today.  We will repeat PET scan and MRI of your brain prior to your next visit.  Return as scheduled in 3 weeks.     Thank you for choosing [place] at [facility] to provide your oncology and hematology care.  To afford each patient quality time with our provider, please arrive at least 15 minutes before your scheduled appointment time.   If you have a lab appointment with the [facility] please come in thru the Main Entrance and check in at the main information desk.  You need to re-schedule your appointment should you arrive 10 or more minutes late.  We strive to give you quality time with our providers, and arriving late affects you and other patients whose appointments are after yours.  Also, if you no show three or more times for appointments you may be dismissed from the clinic at the providers discretion.     Again, thank you for choosing [facility].  Our hope is that these requests will decrease the amount of time that you wait before being seen by our physicians.       _____________________________________________________________  Should you have questions after your visit to [facility], please contact our office at [phone] and follow the prompts.  Our office hours are 8:00 a.m. and 4:30 p.m. Monday - Friday.  Please note that voicemails left after 4:00 p.m. may not be returned until the following business day.  We are closed weekends and major holidays.  You do  have access to a nurse 24-7, just call the main number to the clinic [phone] and do not press any options, hold on the line and a nurse will answer the phone.    For prescription refill requests, have your pharmacy contact our office and allow 72 hours.    Due to Covid, you will need to wear a mask upon entering the hospital. If you do not have a mask, a mask will be given to you at the Main Entrance upon arrival. For doctor visits, patients may have 1 support person age 14 or older with them. For treatment visits, patients can not have anyone with them due to social distancing guidelines and our immunocompromised population.

## 2021-04-22 NOTE — Progress Notes (Signed)
Katelyn [name], Katelyn [name] [id number]   CLINIC:  Medical Oncology/Hematology  PCP:  Celene Squibb, MD [address] [name] [address] [phone]   REASON FOR VISIT:  Follow-up for right lung cancer  PRIOR THERAPY: Right upper lobectomy (08/15/20)  NGS Results: not done  CURRENT THERAPY: Carboplatin, Taxol, and Keytruda every 3 weeks x 4 cycles; to be followed by Beryle Flock maintenance every 3 weeks  BRIEF ONCOLOGIC HISTORY:  Oncology History Overview Note  Foundation One PD-L1 TPS score 1 16 mut/MB [id number] mutation MSI stable   Cancer of upper lobe of right lung ([facility])  08/15/2020 Pathology Results   FINAL MICROSCOPIC DIAGNOSIS:   A. 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  - Tumor is limited to lung.  - Margins are negative for carcinoma.  - One of one lymph nodes negative for carcinoma (0/1).  - See oncology table.   B. LYMPH NODE, 7, EXCISION:  - One of one lymph nodes negative for carcinoma (0/1).   C. LYMPH NODE, 7 #2, EXCISION:  - One of one lymph nodes negative for carcinoma (0/1).   D. LYMPH NODE, 9, EXCISION:  - One of one lymph nodes negative for carcinoma (0/1).   E. LYMPH NODE, 8, EXCISION:  - One of one lymph nodes negative for carcinoma (0/1).   F. LYMPH NODE, 7 #3, EXCISION:  - One of one lymph nodes negative for carcinoma (0/1).   G. LYMPH NODE, 11, EXCISION:  - One of one lymph nodes negative for carcinoma (0/1).   H. LYMPH NODE, 10, EXCISION:  - One of one lymph nodes negative for carcinoma (0/1).   I. LYMPH NODE, 4R, EXCISION:  - One of one lymph nodes negative for carcinoma (0/1).   J. LYMPH NODE, 10 #2, EXCISION:   - One of one lymph nodes negative for carcinoma (0/1).  - Fibrosis and calcifications.   K. LYMPH NODE, 12, EXCISION:  - One of one lymph nodes negative for carcinoma (0/1).   L. LYMPH NODE, 12 #2, EXCISION:  - One of one  lymph nodes negative for carcinoma (0/1).   ONCOLOGY TABLE:   LUNG: Resection   Synchronous Tumors: Not applicable  Total Number of Primary Tumors: 1  Procedure: Right upper lobe lobectomy with lymph node biopsies  Specimen Laterality: Right  Tumor Focality: Unifocal  Tumor Site: Right upper lobe  Tumor Size: 4.4 cm  Histologic Type: Large cell neuroendocrine carcinoma  Visceral Pleura Invasion: Not identified  Direct Invasion of Adjacent Structures: No adjacent structures present  Lymphovascular Invasion: Not identified  Margins: All margins negative for invasive carcinoma       Closest Margin(s) to Invasive Carcinoma: 1.5 cm to bronchial margin  Treatment Effect: No known presurgical therapy  Regional Lymph Nodes:       Number of Lymph Nodes Involved: 0                            Nodal Sites with Tumor: Not applicable       Number of Lymph Nodes Examined: 12                       Nodal Sites Examined: 4, 7, 8, 9, 10, 11, 12  Distant Metastasis:       Distant Site(s) Involved: Not applicable  Pathologic Stage Classification (pTNM, AJCC 8th  Edition): pT2b, pN0  Ancillary Studies: Can be performed upon request  Representative Tumor Block: A2  Comment(s): The tumor has a subtle neuroendocrine features, cribriform areas with necrosis, and geographic areas of necrosis.  Immunohistochemistry is positive for CD56, synaptophysin, and TTF-1.  Chromogranin is largely negative. Ki-67 is elevated. Dr. Saralyn Pilar has reviewed the case.     08/15/2020 Surgery   PREOPERATIVE DIAGNOSIS:  Right upper lobe lung mass, suspected non-small cell carcinoma.   POSTOPERATIVE DIAGNOSIS:  Non-small cell carcinoma, right upper lobe, clinical stage IB (T2, N0).   PROCEDURE:   Xi robotic-assisted right upper lobectomy,  Lymph node dissection,  Intercostal nerve blocks levels 3 through 10.   SURGEON:  Modesto Charon, MD    FINDINGS:  Fragile tissue.  Enlarged but otherwise benign appearing nodes.   One calcified node.  Frozen section revealed non-small cell carcinoma.  Bronchial margin clear.   09/04/2020 Initial Diagnosis   Cancer of upper lobe of right lung ([name])   09/04/2020 Cancer Staging   Staging form: Lung, AJCC 8th Edition - Pathologic stage from 09/04/2020: pT2, pN0, cM0 - Signed by Heath Lark, MD on 09/04/2020 Stage prefix: Initial diagnosis    09/11/2020 Genetic Testing   PD-L1 Results:     12/11/2020 Cancer Staging   Staging form: Lung, AJCC 8th Edition - Pathologic stage from 12/11/2020: Stage IVB (pT2b, pN0, pM1c) - Signed by Derek Jack, MD on 12/11/2020 Histopathologic type: Large cell neuroendocrine carcinoma    12/17/2020 -  Chemotherapy   Patient is on Treatment Plan : LUNG NSCLC Carboplatin + Paclitaxel + Pembrolizumab q21d x 4 cycles / Pembrolizumab Maintenance Q21D       CANCER STAGING:  Cancer Staging  Cancer of upper lobe of right lung [facility]) Staging form: Lung, AJCC 8th Edition - Pathologic stage from 09/04/2020: pT2, pN0, cM0 - Signed by Heath Lark, MD on 09/04/2020 - Pathologic stage from 12/11/2020: Stage IVB (pT2b, pN0, pM1c) - Signed by Derek Jack, MD on 12/11/2020   INTERVAL HISTORY:  [name], a 81 y.o. female, returns for routine follow-up and consideration for next cycle of chemotherapy. Katelyn [name] was last seen on 04/01/2021.  Due for cycle #7 of Keytruda today.   Overall, she tells me she has been feeling pretty well. She reports energy remains low. Her appetite has improved. Her weight is stable. She is drinking 4 Ensure along with 1 meal daily. Her left hip nodule is stable. She denies cough, diarrhea, and skin rash.   Overall, she feels ready for next cycle of chemo today.   REVIEW OF SYSTEMS:  Review of Systems  Constitutional:  Positive for fatigue. Negative for appetite change.  Respiratory:  Negative for cough.   Gastrointestinal:  Negative for diarrhea.  Skin:  Negative for rash.  Neurological:  Positive  for numbness.  All other systems reviewed and are negative.  PAST MEDICAL/SURGICAL HISTORY:  Past Medical History:  Diagnosis Date   Allergy    rhinitis   Aortic stenosis, severe    a. s/p TAVR 08/2014.   Arthritis    spine and various joints   Cancer of upper lobe of right lung ([facility]) 09/04/2020   Carotid artery disease ([facility])    Cataract    Cerebrovascular disease 12/05/2018   Childhood asthma    Chronic bronchitis ([facility])        Claudication ([facility])    COPD GOLD II 07/18/2020   Coronary artery disease    a. April 2012 which revealed mid RCA occlusion with  collaterals, 50% LAD stenosis, 30% circumflex stenosis, and 40% marginal stenosis   First degree AV block    GERD (gastroesophageal reflux disease)    Heart murmur    Hyperlipidemia    Hypertension    Left bundle branch block    LUMBAR RADICULOPATHY, RIGHT    PAF (paroxysmal atrial fibrillation) (HCC)    Peripheral vascular disease ([facility])    a. s/p aortobifem bypass graft surgery and bilateral fundoplasty by [facility].   Renal artery stenosis ([facility])    a. renal artery stenosis (>60% L renal artery) by duplex 04/2020.   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 bilateral subclavian stenosis with antegrade flow by duplex 07/2020   Past Surgical History:  Procedure Laterality Date   AORTO-FEMORAL BYPASS GRAFT Bilateral 06/27/10   BASAL CELL CARCINOMA EXCISION Left    face   CARDIAC CATHETERIZATION N/A 07/25/2014   Procedure: Right/Left Heart Cath and Coronary Angiography;  Surgeon: Troy Sine, MD;  Location: [facility];  Service: Cardiovascular;  Laterality: N/A;   CATARACT EXTRACTION, BILATERAL Bilateral 09/29/2016   COLONOSCOPY  2008   Dr. Laural Golden:  normal.    DILATION AND CURETTAGE OF UTERUS  "2 or 3"   ESOPHAGOGASTRODUODENOSCOPY N/A 08/18/2017   Dr. Gala Romney: Normal-appearing esophagus status post dilation.  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  Small hiatal hernia.   INTERCOSTAL NERVE BLOCK Right 08/15/2020   Procedure: INTERCOSTAL NERVE BLOCK RIGHT;  Surgeon: Melrose Nakayama, MD;  Location: [facility];  Service: Thoracic;  Laterality: Right;   IR THORACENTESIS ASP PLEURAL SPACE W/IMG GUIDE  10/07/2020   MALONEY DILATION N/A 08/18/2017   Procedure: Venia Minks DILATION;  Surgeon: Daneil Dolin, MD;  [facility];  Service: Endoscopy;  Laterality: N/A;   MULTIPLE TOOTH EXTRACTIONS     NODE DISSECTION Right 08/15/2020   Procedure: NODE DISSECTION;  Surgeon: Melrose Nakayama, MD;  Location: [facility];  Service: Thoracic;  Laterality: Right;   PORTACATH PLACEMENT Left 01/15/2021   Procedure: INSERTION PORT-A-CATH;  Surgeon: Aviva Signs, MD;  Location: [facility];  Service: General;  Laterality: Left;   PR VEIN BYPASS GRAFT,AORTO-FEM-POP  06/12/10   TEE WITHOUT CARDIOVERSION N/A 08/31/2014   Procedure: TRANSESOPHAGEAL ECHOCARDIOGRAM (TEE);  Surgeon: Rexene Alberts, MD;  Location: [facility];  Service: Open Heart Surgery;  Laterality: N/A;   TRANSCATHETER AORTIC VALVE REPLACEMENT, TRANSFEMORAL N/A 08/31/2014   Procedure: TRANSCATHETER AORTIC VALVE REPLACEMENT, TRANSFEMORAL approach;  Surgeon: Rexene Alberts, MD;  Location: [facility];  Service: Open Heart Surgery;  Laterality: N/A;   TUBAL LIGATION  1970's   VAGINAL HYSTERECTOMY  1970's   Partial     SOCIAL HISTORY:  Social History   Socioeconomic History   Marital status: Widowed    Spouse name: bobby   Number of children: 1   Years of education: Not on file   Highest education level: High school graduate  Occupational History   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 Retired,used to work at [employer].Lives alone.         Patient has one daughter, 3 grands   Likes to crochet   Works in [employer].    Social Determinants of Health   Financial Resource Strain: Not on file  Food Insecurity: No Food Insecurity   Worried About Charity fundraiser in the Last Year: Never true   Ran Out of Food in the Last Year: Never true  Transportation Needs: No Transportation Needs   Lack of Transportation (Medical): No   Lack of Transportation (Non-Medical): No  Physical Activity: Not on file  Stress: Not on file  Social Connections: Not on file  Intimate Partner Violence: Not on file    FAMILY HISTORY:  Family History  Problem Relation Age of Onset   Heart disease Mother 8       died young   Varicose Veins Mother    Stroke Father    Hypertension Father    Heart disease Father 66       Aneurysm   Heart attack Father    Arthritis Brother    Stroke Brother    Diabetes Daughter    Arthritis Daughter    Heart attack Brother        unsure   Allergic rhinitis Neg Hx    Angioedema Neg Hx    Asthma Neg Hx    Atopy Neg Hx    Eczema Neg Hx    Immunodeficiency Neg Hx    Urticaria Neg Hx     CURRENT MEDICATIONS:  Current Outpatient Medications  Medication Sig Dispense Refill   acetaminophen (TYLENOL) 500 MG tablet Take 500 mg by mouth every 6 (six) hours as needed for moderate pain.     amiodarone (PACERONE) 200 MG tablet Take 1 tablet by mouth once daily 90 tablet 0   CARBOPLATIN IV Inject into the vein every 21 ( twenty-one) days.     diphenhydrAMINE (BENADRYL) 25 MG tablet Take 25  mg by mouth every 6 (six) hours as needed for allergies.     diphenoxylate-atropine (LOMOTIL) 2.5-0.025 MG tablet Take 2 pills at the onset of diarrhea, and then 1 tablet after each episode of diarrhea. 60 tablet 3   estradiol (ESTRACE) 2 MG tablet Take 1 tablet (2 mg total) by mouth daily. 90 tablet 0   furosemide (LASIX) 20 MG tablet Take 1 tablet by mouth once daily 30 tablet 4   ipratropium (ATROVENT) 0.06 % nasal spray USE 1 SPRAY(S) IN EACH NOSTRIL EVERY 6 HOURS AS NEEDED FOR RUNNY NOSE 15 mL 2   irbesartan (AVAPRO) 300 MG tablet Take 1 tablet by mouth once daily 90 tablet 0   levothyroxine (SYNTHROID) 25 MCG tablet Take 1 tablet (25 mcg total) by mouth daily before breakfast. 30 tablet 2   lidocaine-prilocaine (EMLA) cream Apply a small amount to port a cath site and cover with plastic wrap 1 hour prior to infusion appointments 30 g 3   meloxicam (MOBIC) 15 MG tablet Take 1 tablet by mouth once daily (Patient taking differently: Take 15 mg by mouth daily  as needed for pain.) 30 tablet 3   metoCLOPramide (REGLAN) 5 MG tablet Take 2 tablets (10 mg total) by mouth 3 (three) times daily before meals. 30 tablet 3   metoprolol succinate (TOPROL-XL) 25 MG 24 hr tablet Take 1 tablet by mouth once daily 90 tablet 0   OVER THE COUNTER MEDICATION Apply 1 application topically every other day. Lantispetic cream     oxyCODONE (OXY IR/ROXICODONE) 5 MG immediate release tablet Take 1 tablet (5 mg total) by mouth at bedtime as needed for severe pain. 30 tablet 0   PACLITAXEL IV Inject into the vein every 21 ( twenty-one) days.     pantoprazole (PROTONIX) 40 MG tablet TAKE 1 TABLET BY MOUTH ONCE DAILY BEFORE SUPPER (Patient taking differently: Take 40 mg by mouth daily.) 90 tablet 3   Pembrolizumab (KEYTRUDA IV) Inject into the vein every 21 ( twenty-one) days.     potassium chloride (KLOR-CON) 10 MEQ tablet Take 1 tablet (10 mEq total) by mouth daily. 14 tablet 1   rosuvastatin (CRESTOR) 20 MG tablet Take 1  tablet (20 mg total) by mouth at bedtime. 90 tablet 0   Specialty Vitamins Products (MAGNESIUM, AMINO ACID CHELATE,) 133 MG tablet Take 1 tablet by mouth 2 (two) times daily. 60 tablet 3   warfarin (COUMADIN) 2.5 MG tablet Take 1 tablet (2.5 mg total) by mouth daily. 90 tablet 0   nitroGLYCERIN (NITROSTAT) 0.4 MG SL tablet Place 0.4 mg under the tongue every 5 (five) minutes as needed for chest pain. (Patient not taking: Reported on 04/22/2021)     prochlorperazine (COMPAZINE) 10 MG tablet Take 1 tablet (10 mg total) by mouth every 6 (six) hours as needed (Nausea or vomiting). (Patient not taking: Reported on 04/22/2021) 30 tablet 1   No current facility-administered medications for this visit.    ALLERGIES:  Allergies  Allergen Reactions   Adhesive [Tape] Rash   Latex Rash   Tetanus Toxoids Rash   Wound Dressing Adhesive Rash    PHYSICAL EXAM:  Performance status (ECOG): 2 - Symptomatic, <50% confined to bed  Vitals:   04/22/21 1305  BP: (!) 160/69  Pulse: (!) 55  Resp: 18  Temp: 97.8 F (36.6 C)  SpO2: 92%   Wt Readings from Last 3 Encounters:  04/22/21 150 lb 9.6 oz (68.3 kg)  04/01/21 151 lb 9.6 oz (68.8 kg)  03/18/21 152 lb 9.6 oz (69.2 kg)   Physical Exam Vitals reviewed.  Constitutional:      Appearance: Normal appearance.  Cardiovascular:     Rate and Rhythm: Normal rate and regular rhythm.     Pulses: Normal pulses.     Heart sounds: Normal heart sounds.  Pulmonary:     Effort: Pulmonary effort is normal.     Breath sounds: Normal breath sounds.  Abdominal:     Palpations: There is no hepatomegaly, splenomegaly or mass.     Tenderness: There is no abdominal tenderness.  Neurological:     General: No focal deficit present.     Mental Status: She is alert and oriented to person, place, and time.  Psychiatric:        Mood and Affect: Mood normal.        Behavior: Behavior normal.    LABORATORY DATA:  I have reviewed the labs as listed.  CBC Latest Ref  Rng & Units 04/22/2021 04/01/2021 03/11/2021  WBC 4.0 - 10.5 K/uL 7.0 5.5 9.1  Hemoglobin 12.0 - 15.0 g/dL 14.4 12.9 11.7(L)  Hematocrit  36.0 - 46.0 % 45.1 42.1 37.1  Platelets 150 - 400 K/uL 88(L) 93(L) 99(L)   CMP Latest Ref Rng & Units 04/22/2021 04/01/2021 03/11/2021  Glucose 70 - 99 mg/dL 105(H) 119(H) 106(H)  BUN 8 - 23 mg/dL 23 20 25(H)  Creatinine 0.44 - 1.00 mg/dL 1.24(H) 1.21(H) 1.18(H)  Sodium 135 - 145 mmol/L 139 139 140  Potassium 3.5 - 5.1 mmol/L 4.0 4.3 4.3  Chloride 98 - 111 mmol/L 107 109 105  CO2 22 - 32 mmol/L _0 Calcium 8.9 - 10.3 mg/dL 8.7(L) 8.4(L) 8.8(L)  Total Protein 6.5 - 8.1 g/dL 6.3(L) 6.3(L) 6.5  Total Bilirubin 0.3 - 1.2 mg/dL 0.5 0.4 0.7  Alkaline Phos 38 - 126 U/L 177(H) 119 125  AST 15 - 41 U/L 55(H) 32 32  ALT 0 - 44 U/L _1 DIAGNOSTIC IMAGING:  I have independently reviewed the scans and discussed with the patient. No results found.   ASSESSMENT:  1.  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 - She reported COVID infection in December 2020 and has been fatigued since then. - Chest x-ray on 06/21/2020 showed masslike opacity in the right lung. - CT chest with contrast on 06/28/2020 showed right upper lobe lung mass measuring 4 x 3.2 x 3.8 cm.  Small noncalcified mediastinal lymph nodes are all subcentimeter, nonspecific.  Small subpleural nodules in the right upper lobe measure up to 5 mm.  Clustered lingular nodules measuring up to 6 mm nonspecific.  Nonspecific 8 mm enhancing focus in the left lobe of the liver. - Right upper lobectomy and lymph node dissection on 08/15/2020. - Pathology consistent with 4.4 cm large cell neuroendocrine carcinoma, margins negative.  Lymph nodes at stations 7, 8, 9, 10, 11, 4R, 12 negative for metastatic disease.  No visceral pleural invasion or direct invasion of the adjacent structures.  No lymphovascular invasion.  IHC  positive for CD56, synaptophysin and TTF-1.  Chromogranin negative.  Ki-67 elevated. - PET scan on 10/01/2020 with postoperative changes in the chest wall.  Intense hyper metabolism in the acromion with SUV 8.6.  Hypermetabolic focus in the right gluteal region favoring injection site. - Foundation 1 results showed TMB high, K-ras G12C mutation positive.  STK 11 positive - PD-L1 TPS 1%. - Right lateral pelvic soft tissue biopsy on 11/27/2020 consistent with metastatic neuroendocrine carcinoma, Ki-67 40-50%. - Based on K-ras G 12 C and STK 11 positivity, we have recommended treatment like non-small cell lung cancer. - Cycle 1 of carboplatin, paclitaxel and pembrolizumab on 12/17/2020.   2.  Social/family history: - She lives by herself at home and is independent of ADLs and IADLs. - She smoked 2 packs/day for 56 years and quit in May 2012. - She worked in [employer]. - Her nephew had colon cancer.  No other malignancies in the family.   PLAN:  1.  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 - She did not have any immunotherapy related side effects after last cycle. - Labs from today shows normal CBC.  She continues to have thrombocytopenia which has not recovered after discontinuation of chemotherapy.  Labs today also showed elevated alk phos and AST for the first time.  She denies starting on any new medications.  Elevated creatinine is stable around 1.24. - She will proceed with Keytruda today.  RTC 3 weeks for follow-up.  We will plan to repeat PET scan and MRI of the brain with and without contrast.   2.  Hypothyroidism: - TSH today 7.32.  She is taking Synthroid 25 mcg daily. - Will increase Synthroid to 50 mcg daily.  3.  Nausea: - Continue Reglan 5 mg as needed.  4.  Multiple subcutaneous nodules: - Left hip skin nodule measures 1 cm without erythema and stable.  5.  Nutrition: - Continue 4 cans of  Ensure daily.  Weight is stable.   Orders placed this encounter:  No orders of the defined types were placed in this encounter.    Derek Jack, MD [place] [phone]   I, Thana Ates, am acting as a scribe for Dr. Derek Jack.  I, Derek Jack MD, have reviewed the above documentation for accuracy and completeness, and I agree with the above.

## 2021-04-22 NOTE — Progress Notes (Signed)
Patient tolerated therapy with no complaints voiced. Side effects with management reviewed understanding verbalized. Port site clean and dry with no bruising or swelling noted at site. Patient's BP 182/73 and 184/77, patient states she takes her BP medications at night, Dr. Delton Coombes made aware. Okay to discharge from cancer center per Dr. Delton Coombes and patient is to take her BP medication when she gets home. Patient educated on keeping a check on BP at home, patient verbalizes understanding. Good blood return noted before and after administration of therapy. Band aid applied. Patient left in satisfactory condition with VSS and no s/s of distress noted.

## 2021-04-22 NOTE — Patient Instructions (Signed)
Description   Called and spoke to pt and instructed her to continue taking warfarin 1 tablet (2.5mg ) daily. Recheck INR in 1 week. [facility] [phone]

## 2021-04-28 ENCOUNTER — Other Ambulatory Visit: Payer: Self-pay

## 2021-04-28 DIAGNOSIS — I739 Peripheral vascular disease, unspecified: Secondary | ICD-10-CM

## 2021-04-29 ENCOUNTER — Ambulatory Visit (INDEPENDENT_AMBULATORY_CARE_PROVIDER_SITE_OTHER): Payer: Medicare Other | Admitting: Cardiology

## 2021-04-29 DIAGNOSIS — Z5181 Encounter for therapeutic drug level monitoring: Secondary | ICD-10-CM

## 2021-04-29 LAB — POCT INR: INR: 2.9 (ref 2.0–3.0)

## 2021-04-29 NOTE — Patient Instructions (Signed)
Description   Called and spoke to pt and instructed her to continue taking warfarin 1 tablet (2.5mg ) daily. Recheck INR in 1 week. [facility] [phone]

## 2021-05-04 ENCOUNTER — Other Ambulatory Visit: Payer: Self-pay | Admitting: Cardiovascular Disease

## 2021-05-05 ENCOUNTER — Ambulatory Visit (INDEPENDENT_AMBULATORY_CARE_PROVIDER_SITE_OTHER): Payer: Medicare Other | Admitting: Physician Assistant

## 2021-05-05 ENCOUNTER — Other Ambulatory Visit: Payer: Self-pay

## 2021-05-05 ENCOUNTER — Ambulatory Visit (HOSPITAL_COMMUNITY)
Admission: RE | Admit: 2021-05-05 | Discharge: 2021-05-05 | Disposition: A | Discharge status: Home / Self Care | Payer: Medicare Other | Source: Ambulatory Visit | Attending: Surgery | Admitting: Surgery

## 2021-05-05 VITALS — BP 99/56 | HR 63 | Temp 97.4°F | Resp 16 | Ht 69.5 in | Wt 150.0 lb

## 2021-05-05 DIAGNOSIS — I739 Peripheral vascular disease, unspecified: Secondary | ICD-10-CM

## 2021-05-05 DIAGNOSIS — I7409 Other arterial embolism and thrombosis of abdominal aorta: Secondary | ICD-10-CM

## 2021-05-06 ENCOUNTER — Ambulatory Visit (INDEPENDENT_AMBULATORY_CARE_PROVIDER_SITE_OTHER): Payer: Medicare Other | Admitting: Pharmacist

## 2021-05-06 ENCOUNTER — Encounter: Payer: Self-pay | Admitting: Physician Assistant

## 2021-05-06 DIAGNOSIS — Z7901 Long term (current) use of anticoagulants: Secondary | ICD-10-CM | POA: Diagnosis not present

## 2021-05-06 DIAGNOSIS — I4891 Unspecified atrial fibrillation: Secondary | ICD-10-CM

## 2021-05-06 LAB — POCT INR: INR: 2.6 (ref 2.0–3.0)

## 2021-05-06 NOTE — Progress Notes (Signed)
[facility] HISTORY AND PHYSICAL   History of Present Illness:  Patient is a 81 y.o. year old female who presents for evaluation of PAD s/p aortobifemoral bypass graft in April of 2012 by Dr. Trula Slade.  She denies claudication, rest or non healing wounds.  She has chronic edema and continues to wear compression stockings almost daily.  She has been going through a very difficult time health wise lately.  She had a right upper lobe lobectomy 08/15/20 and has been undergoing follow up treatment.  She has lost weight and has become very weak.  Her primary mobility is a WC.    Past Medical History:  Diagnosis Date   Allergy    rhinitis   Aortic stenosis, severe    a. s/p TAVR 08/2014.   Arthritis    spine and various joints   Cancer of upper lobe of right lung ([place]) 09/04/2020   Carotid artery disease ([place])    Cataract    Cerebrovascular disease 12/05/2018   Childhood asthma    Chronic bronchitis ([place])        Chronic kidney disease 02/17/2021   Claudication ([place])    COPD GOLD II 07/18/2020   Coronary artery disease    a. April 2012 which revealed mid RCA occlusion with collaterals, 50% LAD stenosis, 30% circumflex stenosis, and 40% marginal stenosis   First degree AV block    GERD (gastroesophageal reflux disease)    Heart murmur    Hyperlipidemia    Hypertension    Hypothyroidism 11/14/2020   Left bundle branch block    LUMBAR RADICULOPATHY, RIGHT    PAF (paroxysmal atrial fibrillation) ([place])    Peripheral vascular disease ([place])    a. s/p aortobifem bypass graft surgery and bilateral fundoplasty by [facility].   Renal artery stenosis ([place])    a. renal artery stenosis (>60% L renal artery) by duplex 04/2020.   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 bilateral subclavian stenosis with antegrade flow by duplex 07/2020    Past Surgical History:  Procedure Laterality Date   AORTO-FEMORAL BYPASS GRAFT Bilateral 06/27/10   BASAL CELL CARCINOMA EXCISION Left    face   CARDIAC CATHETERIZATION N/A 07/25/2014   Procedure: Right/Left Heart Cath and Coronary Angiography;  Surgeon: Troy Sine, MD;  Location: [facility];  Service: Cardiovascular;  Laterality: N/A;   CATARACT EXTRACTION, BILATERAL Bilateral 09/29/2016   COLONOSCOPY  2008   Dr. Laural Golden: normal.    DILATION AND CURETTAGE OF UTERUS  "2 or 3"   ESOPHAGOGASTRODUODENOSCOPY N/A 08/18/2017   Dr. Gala Romney: Normal-appearing esophagus status post dilation.  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  Small hiatal hernia.   INTERCOSTAL NERVE BLOCK Right 08/15/2020   Procedure: INTERCOSTAL NERVE BLOCK RIGHT;  Surgeon: Melrose Nakayama, MD;  Location: [facility];  Service: Thoracic;  Laterality: Right;   IR THORACENTESIS ASP PLEURAL SPACE W/IMG GUIDE  10/07/2020   MALONEY DILATION N/A 08/18/2017   Procedure: Venia Minks DILATION;  Surgeon: Daneil Dolin, MD;  [facility];  Service: Endoscopy;  Laterality: N/A;   MULTIPLE TOOTH EXTRACTIONS     NODE DISSECTION Right 08/15/2020   Procedure: NODE DISSECTION;  Surgeon: Melrose Nakayama, MD;  Location: [facility];  Service: Thoracic;  Laterality: Right;   PORTACATH PLACEMENT Left 01/15/2021   Procedure: INSERTION PORT-A-CATH;  Surgeon: Aviva Signs, MD;  Location: [facility];  Service: General;  Laterality: Left;   PR VEIN BYPASS GRAFT,AORTO-FEM-POP  06/12/10   TEE WITHOUT CARDIOVERSION N/A 08/31/2014   Procedure: TRANSESOPHAGEAL ECHOCARDIOGRAM (TEE);  Surgeon: Rexene Alberts, MD;  Location: [facility];  Service: Open Heart Surgery;  Laterality: N/A;   TRANSCATHETER AORTIC VALVE REPLACEMENT, TRANSFEMORAL N/A 08/31/2014   Procedure: TRANSCATHETER AORTIC VALVE REPLACEMENT, TRANSFEMORAL  approach;  Surgeon: Rexene Alberts, MD;  Location: [facility];  Service: Open Heart Surgery;  Laterality: N/A;   TUBAL LIGATION  1970's   VAGINAL HYSTERECTOMY  1970's   Partial      Social History Social History   Tobacco Use   Smoking status: Former    Packs/day: 2.00    Years: 56.00    Pack years: 112.00    Types: Cigarettes    Start date: 03/02/1952    Quit date: 06/01/2010    Years since quitting: 10.9   Smokeless tobacco: Never  Vaping Use   Vaping Use: Never used  Substance Use Topics   Alcohol use: No   Drug use: No    Family History Family History  Problem Relation Age of Onset   Heart disease Mother 52       died young   Varicose Veins Mother    Stroke Father    Hypertension Father    Heart disease Father 82       Aneurysm   Heart attack Father    Arthritis Brother    Stroke Brother    Diabetes Daughter    Arthritis Daughter    Heart attack Brother        unsure   Allergic rhinitis Neg Hx    Angioedema Neg Hx    Asthma Neg Hx    Atopy Neg Hx    Eczema Neg Hx    Immunodeficiency Neg Hx    Urticaria Neg Hx     Allergies  Allergies  Allergen Reactions   Adhesive [Tape] Rash   Latex Rash   Tetanus Toxoids Rash   Wound Dressing Adhesive Rash     Current Outpatient Medications  Medication Sig Dispense Refill   acetaminophen (TYLENOL) 500 MG tablet Take 500 mg by mouth every 6 (six) hours as needed for moderate pain.     amiodarone (PACERONE) 200 MG tablet Take 1 tablet by mouth once daily 90 tablet 0   CARBOPLATIN IV Inject into the vein every 21 ( twenty-one) days.     diphenhydrAMINE (BENADRYL) 25 MG tablet Take 25 mg by mouth every 6 (six) hours as needed for allergies.     diphenoxylate-atropine (LOMOTIL) 2.5-0.025 MG tablet Take 2 pills at the onset of diarrhea, and then 1 tablet after each episode of diarrhea. 60 tablet 3   estradiol (ESTRACE) 2 MG tablet Take 1 tablet (2 mg total) by mouth daily. 90 tablet 0   furosemide (LASIX) 20 MG tablet  Take 1 tablet by mouth once daily 30 tablet 4   ipratropium (ATROVENT) 0.06 % nasal spray USE 1 SPRAY(S) IN EACH NOSTRIL EVERY 6 HOURS AS NEEDED FOR RUNNY NOSE 15 mL 2   irbesartan (AVAPRO) 300 MG tablet Take 1 tablet by mouth once daily 90 tablet 0   levothyroxine (SYNTHROID) 50 MCG tablet Take 1 tablet (50 mcg total) by mouth daily before breakfast. 30 tablet  3   lidocaine-prilocaine (EMLA) cream Apply a small amount to port a cath site and cover with plastic wrap 1 hour prior to infusion appointments 30 g 3   meloxicam (MOBIC) 15 MG tablet Take 1 tablet by mouth once daily (Patient taking differently: Take 15 mg by mouth daily as needed for pain.) 30 tablet 3   metoCLOPramide (REGLAN) 5 MG tablet Take 2 tablets (10 mg total) by mouth 3 (three) times daily before meals. 30 tablet 3   metoprolol succinate (TOPROL-XL) 25 MG 24 hr tablet Take 1 tablet by mouth once daily 90 tablet 0   nitroGLYCERIN (NITROSTAT) 0.4 MG SL tablet Place 0.4 mg under the tongue every 5 (five) minutes as needed for chest pain.     OVER THE COUNTER MEDICATION Apply 1 application topically every other day. Lantispetic cream     oxyCODONE (OXY IR/ROXICODONE) 5 MG immediate release tablet Take 1 tablet (5 mg total) by mouth at bedtime as needed for severe pain. 30 tablet 0   PACLITAXEL IV Inject into the vein every 21 ( twenty-one) days.     pantoprazole (PROTONIX) 40 MG tablet TAKE 1 TABLET BY MOUTH ONCE DAILY BEFORE SUPPER (Patient taking differently: Take 40 mg by mouth daily.) 90 tablet 3   Pembrolizumab (KEYTRUDA IV) Inject into the vein every 21 ( twenty-one) days.     potassium chloride (KLOR-CON) 10 MEQ tablet Take 1 tablet (10 mEq total) by mouth daily. 14 tablet 1   prochlorperazine (COMPAZINE) 10 MG tablet Take 1 tablet (10 mg total) by mouth every 6 (six) hours as needed (Nausea or vomiting). 30 tablet 1   rosuvastatin (CRESTOR) 20 MG tablet Take 1 tablet (20 mg total) by mouth at bedtime. 90 tablet 0   Specialty  Vitamins Products (MAGNESIUM, AMINO ACID CHELATE,) 133 MG tablet Take 1 tablet by mouth 2 (two) times daily. 60 tablet 3   warfarin (COUMADIN) 2.5 MG tablet Take 1 tablet (2.5 mg total) by mouth daily. 90 tablet 0   No current facility-administered medications for this visit.    ROS:   General:  No weight loss, Fever, chills  HEENT: No recent headaches, no nasal bleeding, no visual changes, no sore throat  Neurologic: No dizziness, blackouts, seizures. No recent symptoms of stroke or mini- stroke. No recent episodes of slurred speech, or temporary blindness.  Cardiac: No recent episodes of chest pain/pressure, no shortness of breath at rest.  No shortness of breath with exertion.  Denies history of atrial fibrillation or irregular heartbeat  Vascular: No history of rest pain in feet.  No history of claudication.  No history of non-healing ulcer, No history of DVT   Pulmonary: No home oxygen, no productive cough, no hemoptysis,  No asthma or wheezing [X]  lung cancer Musculoskeletal:  [ ]  Arthritis, [ ]  Low back pain,  [ ]  Joint pain  Hematologic:No history of hypercoagulable state.  No history of easy bleeding.  No history of anemia  Gastrointestinal: No hematochezia or melena,  No gastroesophageal reflux, no trouble swallowing  Urinary: [ ]  chronic Kidney disease, [ ]  on HD - [ ]  MWF or [ ]  TTHS, [ ]  Burning with urination, [ ]  Frequent urination, [ ]  Difficulty urinating;   Skin: No rashes  Psychological: No history of anxiety,  No history of depression   Physical Examination  Vitals:   05/05/21 1255  BP: (!) 99/56  Pulse: 63  Resp: 16  Temp: (!) 97.4 F (36.3 C)  TempSrc: Temporal  SpO2:  92%  Weight: 150 lb (68 kg)  Height: 5' 9.5" (1.765 m)    Body mass index is 21.83 kg/m.  General:  Alert and oriented, no acute distress HEENT: Normal Neck: No bruit or JVD Pulmonary: Clear to auscultation bilaterally Cardiac: Regular Rate and Rhythm with  murmur Gastrointestinal: Soft, non-tender, non-distended, no mass, no scars Skin: No rash Extremity Pulses:  2+ radial, brachial, dorsalis pedis  pulses bilaterally Musculoskeletal: No deformity or edema  Neurologic: Upper and lower extremity motor grossly intact and symmetric  DATA:  ABI Findings:  +---------+------------------+-----+--------+--------+   Right     Rt Pressure (mmHg) Index Waveform Comment    +---------+------------------+-----+--------+--------+   Brachial  153                                          +---------+------------------+-----+--------+--------+   PTA       183                1.20  biphasic            +---------+------------------+-----+--------+--------+   DP        162                1.06  biphasic            +---------+------------------+-----+--------+--------+   Great Toe 98                 0.64                      +---------+------------------+-----+--------+--------+   +---------+------------------+-----+---------+-------+   Left      Lt Pressure (mmHg) Index Waveform  Comment   +---------+------------------+-----+---------+-------+   Brachial  151                                          +---------+------------------+-----+---------+-------+   PTA       160                1.05  triphasic           +---------+------------------+-----+---------+-------+   DP        149                0.97  biphasic            +---------+------------------+-----+---------+-------+   Great Toe 107                0.70                      +---------+------------------+-----+---------+-------+   +-------+-----------+-----------+------------+------------+   ABI/TBI Today's ABI Today's TBI Previous ABI Previous TBI   +-------+-----------+-----------+------------+------------+   Right   1.20        0.64        1.08         0.64           +-------+-----------+-----------+------------+------------+   Left    1.05        0.70        1.01         0.55            +-------+-----------+-----------+------------+------------+       Bilateral ABIs appear essentially unchanged compared to prior study on  05/01/2020.  Summary:  Right: Resting right ankle-brachial index is within normal range. No  evidence of significant right lower extremity arterial disease. The right  toe-brachial index is abnormal.   Left: Resting left ankle-brachial index is within normal range. No  evidence of significant left lower extremity arterial disease. The left  toe-brachial index is normal.   ASSESSMENT:  PAD s/p aortobifemoral bypass graft in April of 2012 by Dr. Trula Slade. Her ABI's are stable and unchanged.  She has palpable pedal pulses. She denise rest pain or non healing wounds.  She does not ambulate currently due to Cancer treatments and nutritional decline with generalized weakness.     PLAN: Activity as tolerates.  If she develops symptoms of ischemia she will call.  She will f/u in 1 year for repeat ABI's.  She will continue Statin daily as well as her Coumadin to manage her Afib.   Roxy Horseman PA-C [facility] Office: [phone]  MD in clinic [place]

## 2021-05-06 NOTE — Patient Instructions (Signed)
Description   ?Called and spoke to pt and instructed her to continue taking warfarin 1 tablet (2.5mg ) daily. Recheck INR in 1 week. [facility] [phone] ?  ? ? ?

## 2021-05-07 ENCOUNTER — Other Ambulatory Visit: Payer: Self-pay

## 2021-05-07 ENCOUNTER — Ambulatory Visit (HOSPITAL_COMMUNITY)
Admission: RE | Admit: 2021-05-07 | Discharge: 2021-05-07 | Disposition: A | Discharge status: Home / Self Care | Payer: Medicare Other | Source: Ambulatory Visit | Attending: Hematology | Admitting: Hematology

## 2021-05-07 DIAGNOSIS — C3411 Malignant neoplasm of upper lobe, right bronchus or lung: Secondary | ICD-10-CM | POA: Insufficient documentation

## 2021-05-07 DIAGNOSIS — G319 Degenerative disease of nervous system, unspecified: Secondary | ICD-10-CM | POA: Diagnosis not present

## 2021-05-07 DIAGNOSIS — R200 Anesthesia of skin: Secondary | ICD-10-CM | POA: Diagnosis not present

## 2021-05-07 MED ORDER — GADOBUTROL 1 MMOL/ML IV SOLN
7.0000 mL | Freq: Once | INTRAVENOUS | Status: AC | PRN
  Administered 2021-05-07: 7 mL via INTRAVENOUS

## 2021-05-08 ENCOUNTER — Encounter (HOSPITAL_COMMUNITY)
Admission: RE | Admit: 2021-05-08 | Discharge: 2021-05-08 | Disposition: A | Discharge status: Home / Self Care | Payer: Medicare Other | Source: Ambulatory Visit | Attending: Hematology | Admitting: Hematology

## 2021-05-08 DIAGNOSIS — C3411 Malignant neoplasm of upper lobe, right bronchus or lung: Secondary | ICD-10-CM | POA: Diagnosis not present

## 2021-05-08 DIAGNOSIS — C3490 Malignant neoplasm of unspecified part of unspecified bronchus or lung: Secondary | ICD-10-CM | POA: Diagnosis not present

## 2021-05-08 DIAGNOSIS — J90 Pleural effusion, not elsewhere classified: Secondary | ICD-10-CM | POA: Diagnosis not present

## 2021-05-08 DIAGNOSIS — J432 Centrilobular emphysema: Secondary | ICD-10-CM | POA: Diagnosis not present

## 2021-05-08 DIAGNOSIS — I2510 Atherosclerotic heart disease of native coronary artery without angina pectoris: Secondary | ICD-10-CM | POA: Diagnosis not present

## 2021-05-08 MED ORDER — FLUDEOXYGLUCOSE F - 18 (FDG) INJECTION
8.0200 | Freq: Once | INTRAVENOUS | Status: AC | PRN
  Administered 2021-05-08: 08:00:00 8.02 via INTRAVENOUS

## 2021-05-13 ENCOUNTER — Inpatient Hospital Stay (HOSPITAL_COMMUNITY): Payer: Medicare Other | Attending: Hematology

## 2021-05-13 ENCOUNTER — Encounter (HOSPITAL_COMMUNITY): Payer: Self-pay | Admitting: Hematology

## 2021-05-13 ENCOUNTER — Inpatient Hospital Stay (HOSPITAL_COMMUNITY): Payer: Medicare Other

## 2021-05-13 ENCOUNTER — Other Ambulatory Visit: Payer: Self-pay

## 2021-05-13 ENCOUNTER — Inpatient Hospital Stay (HOSPITAL_BASED_OUTPATIENT_CLINIC_OR_DEPARTMENT_OTHER): Payer: Medicare Other | Admitting: Hematology

## 2021-05-13 VITALS — BP 122/54 | HR 76 | Temp 97.3°F | Resp 18 | Ht 68.5 in | Wt 141.1 lb

## 2021-05-13 DIAGNOSIS — R200 Anesthesia of skin: Secondary | ICD-10-CM | POA: Insufficient documentation

## 2021-05-13 DIAGNOSIS — R059 Cough, unspecified: Secondary | ICD-10-CM | POA: Diagnosis not present

## 2021-05-13 DIAGNOSIS — Z8261 Family history of arthritis: Secondary | ICD-10-CM | POA: Insufficient documentation

## 2021-05-13 DIAGNOSIS — R110 Nausea: Secondary | ICD-10-CM | POA: Diagnosis not present

## 2021-05-13 DIAGNOSIS — Z833 Family history of diabetes mellitus: Secondary | ICD-10-CM | POA: Diagnosis not present

## 2021-05-13 DIAGNOSIS — Z87891 Personal history of nicotine dependence: Secondary | ICD-10-CM | POA: Diagnosis not present

## 2021-05-13 DIAGNOSIS — C3411 Malignant neoplasm of upper lobe, right bronchus or lung: Secondary | ICD-10-CM | POA: Insufficient documentation

## 2021-05-13 DIAGNOSIS — Z79899 Other long term (current) drug therapy: Secondary | ICD-10-CM | POA: Insufficient documentation

## 2021-05-13 DIAGNOSIS — G479 Sleep disorder, unspecified: Secondary | ICD-10-CM | POA: Insufficient documentation

## 2021-05-13 DIAGNOSIS — C3490 Malignant neoplasm of unspecified part of unspecified bronchus or lung: Secondary | ICD-10-CM

## 2021-05-13 DIAGNOSIS — I10 Essential (primary) hypertension: Secondary | ICD-10-CM | POA: Diagnosis not present

## 2021-05-13 DIAGNOSIS — E785 Hyperlipidemia, unspecified: Secondary | ICD-10-CM | POA: Diagnosis not present

## 2021-05-13 DIAGNOSIS — R5383 Other fatigue: Secondary | ICD-10-CM | POA: Insufficient documentation

## 2021-05-13 DIAGNOSIS — I2510 Atherosclerotic heart disease of native coronary artery without angina pectoris: Secondary | ICD-10-CM | POA: Insufficient documentation

## 2021-05-13 DIAGNOSIS — I480 Paroxysmal atrial fibrillation: Secondary | ICD-10-CM | POA: Insufficient documentation

## 2021-05-13 DIAGNOSIS — Z823 Family history of stroke: Secondary | ICD-10-CM | POA: Insufficient documentation

## 2021-05-13 DIAGNOSIS — Z8249 Family history of ischemic heart disease and other diseases of the circulatory system: Secondary | ICD-10-CM | POA: Insufficient documentation

## 2021-05-13 DIAGNOSIS — E039 Hypothyroidism, unspecified: Secondary | ICD-10-CM | POA: Insufficient documentation

## 2021-05-13 LAB — CBC WITH DIFFERENTIAL/PLATELET
Abs Immature Granulocytes: 0.02 10*3/uL (ref 0.00–0.07)
Basophils Absolute: 0.1 10*3/uL (ref 0.0–0.1)
Basophils Relative: 1 %
Eosinophils Absolute: 0.4 10*3/uL (ref 0.0–0.5)
Eosinophils Relative: 6 %
HCT: 45.7 % (ref 36.0–46.0)
Hemoglobin: 14.8 g/dL (ref 12.0–15.0)
Immature Granulocytes: 0 %
Lymphocytes Relative: 8 %
Lymphs Abs: 0.5 10*3/uL — ABNORMAL LOW (ref 0.7–4.0)
MCH: 31.6 pg (ref 26.0–34.0)
MCHC: 32.4 g/dL (ref 30.0–36.0)
MCV: 97.4 fL (ref 80.0–100.0)
Monocytes Absolute: 0.6 10*3/uL (ref 0.1–1.0)
Monocytes Relative: 8 %
Neutro Abs: 5.5 10*3/uL (ref 1.7–7.7)
Neutrophils Relative %: 77 %
Platelets: 145 10*3/uL — ABNORMAL LOW (ref 150–400)
RBC: 4.69 MIL/uL (ref 3.87–5.11)
RDW: 13.8 % (ref 11.5–15.5)
WBC: 7.1 10*3/uL (ref 4.0–10.5)
nRBC: 0 % (ref 0.0–0.2)

## 2021-05-13 LAB — COMPREHENSIVE METABOLIC PANEL
ALT: 21 U/L (ref 0–44)
AST: 34 U/L (ref 15–41)
Albumin: 2.7 g/dL — ABNORMAL LOW (ref 3.5–5.0)
Alkaline Phosphatase: 147 U/L — ABNORMAL HIGH (ref 38–126)
Anion gap: 10 (ref 5–15)
BUN: 32 mg/dL — ABNORMAL HIGH (ref 8–23)
CO2: 27 mmol/L (ref 22–32)
Calcium: 8.2 mg/dL — ABNORMAL LOW (ref 8.9–10.3)
Chloride: 101 mmol/L (ref 98–111)
Creatinine, Ser: 1.34 mg/dL — ABNORMAL HIGH (ref 0.44–1.00)
GFR, Estimated: 41 mL/min — ABNORMAL LOW (ref >60)
Glucose, Bld: 133 mg/dL — ABNORMAL HIGH (ref 70–99)
Potassium: 3.5 mmol/L (ref 3.5–5.1)
Sodium: 138 mmol/L (ref 135–145)
Total Bilirubin: 0.7 mg/dL (ref 0.3–1.2)
Total Protein: 6 g/dL — ABNORMAL LOW (ref 6.5–8.1)

## 2021-05-13 LAB — MAGNESIUM: Magnesium: 1.7 mg/dL (ref 1.7–2.4)

## 2021-05-13 LAB — TSH: TSH: 0.871 u[IU]/mL (ref 0.350–4.500)

## 2021-05-13 MED ORDER — PREDNISONE 20 MG PO TABS
60.0000 mg | ORAL_TABLET | Freq: Every day | ORAL | 0 refills | Status: DC
Start: 2021-05-13 — End: 2021-06-12

## 2021-05-13 NOTE — Progress Notes (Addendum)
Patient presents today for [facility] per providers order.  Labs and vital signs within parameters for treatment.   ? ?Per Dr.Katragadda patient will not receive treatment today due to pneumonitis on her PET scan. ? ?Discharge from clinic via wheelchair in stable condition.  Alert and oriented X 3.  Follow up with [facility] as scheduled.  ?

## 2021-05-13 NOTE — Patient Instructions (Signed)
Katelyn [name]  Discharge Instructions: ?Thank you for choosing [facility] to provide your oncology and hematology care.  ?If you have a lab appointment with the [facility], please come in thru the Main Entrance and check in at the main information desk. ? ?Wear comfortable clothing and clothing appropriate for easy access to any Portacath or PICC line.  ? ?We strive to give you quality time with your provider. You may need to reschedule your appointment if you arrive late (15 or more minutes).  Arriving late affects you and other patients whose appointments are after yours.  Also, if you miss three or more appointments without notifying the office, you may be dismissed from the clinic at the provider?s discretion.    ?  ?For prescription refill requests, have your pharmacy contact our office and allow 72 hours for refills to be completed.   ? ?Today you received the following chemotherapy and/or immunotherapy agents No treatment today    ?  ?To help prevent nausea and vomiting after your treatment, we encourage you to take your nausea medication as directed. ? ?BELOW ARE SYMPTOMS THAT SHOULD BE REPORTED IMMEDIATELY: ?*FEVER GREATER THAN 100.4 F (38 ?C) OR HIGHER ?*CHILLS OR SWEATING ?*NAUSEA AND VOMITING THAT IS NOT CONTROLLED WITH YOUR NAUSEA MEDICATION ?*UNUSUAL SHORTNESS OF BREATH ?*UNUSUAL BRUISING OR BLEEDING ?*URINARY PROBLEMS (pain or burning when urinating, or frequent urination) ?*BOWEL PROBLEMS (unusual diarrhea, constipation, pain near the anus) ?TENDERNESS IN MOUTH AND THROAT WITH OR WITHOUT PRESENCE OF ULCERS (sore throat, sores in mouth, or a toothache) ?UNUSUAL RASH, SWELLING OR PAIN  ?UNUSUAL VAGINAL DISCHARGE OR ITCHING  ? ?Items with * indicate a potential emergency and should be followed up as soon as possible or go to the Emergency Department if any problems should occur. ? ?Please show the CHEMOTHERAPY ALERT CARD or IMMUNOTHERAPY ALERT CARD at check-in to the  Emergency Department and triage nurse. ? ?Should you have questions after your visit or need to cancel or reschedule your appointment, please contact [facility] [phone]  and follow the prompts.  Office hours are 8:00 a.m. to 4:30 p.m. Monday - Friday. Please note that voicemails left after 4:00 p.m. may not be returned until the following business day.  We are closed weekends and major holidays. You have access to a nurse at all times for urgent questions. Please call the main number to the clinic [phone] and follow the prompts. ? ?For any non-urgent questions, you may also contact your provider using MyChart. We now offer e-Visits for anyone 43 and older to request care online for non-urgent symptoms. For details visit [email or web address]. ?  ?Also download the MyChart app! Go to the app store, search "MyChart", open the app, select [facility], and log in with your MyChart username and password. ? ?Due to Covid, a mask is required upon entering the hospital/clinic. If you do not have a mask, one will be given to you upon arrival. For doctor visits, patients may have 1 support person aged 19 or older with them. For treatment visits, patients cannot have anyone with them due to current Covid guidelines and our immunocompromised population.  ?

## 2021-05-13 NOTE — Progress Notes (Signed)
? ?[facility] ?[address] ?[place], [place] [address] ? ? ?CLINIC:  ?Medical Oncology/Hematology ? ?PCP:  ?Celene Squibb, MD ?[address] [place] [address] ?[phone] ? ? ?REASON FOR VISIT:  ?Follow-up for right lung cancer ? ?PRIOR THERAPY: Right upper lobectomy (08/15/20) ? ?NGS Results: not done ? ?CURRENT THERAPY: Carboplatin, Taxol, and Keytruda every 3 weeks x 4 cycles; to be followed by Beryle Flock maintenance every 3 weeks ? ?BRIEF ONCOLOGIC HISTORY:  ?Oncology History Overview Note  ?Foundation One PD-L1 TPS score 1 ?16 mut/MB ?KRASG12C mutation ?MSI stable ?  ?Cancer of upper lobe of right lung ([place])  ?08/15/2020 Pathology Results  ? FINAL MICROSCOPIC DIAGNOSIS:  ? ?A. 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  ?- Tumor is limited to lung.  ?- Margins are negative for carcinoma.  ?- One of one lymph nodes negative for carcinoma (0/1).  ?- See oncology table.  ? ?B. LYMPH NODE, 7, EXCISION:  ?- One of one lymph nodes negative for carcinoma (0/1).  ? ?C. LYMPH NODE, 7 #2, EXCISION:  ?- One of one lymph nodes negative for carcinoma (0/1).  ? ?D. LYMPH NODE, 9, EXCISION:  ?- One of one lymph nodes negative for carcinoma (0/1).  ? ?E. LYMPH NODE, 8, EXCISION:  ?- One of one lymph nodes negative for carcinoma (0/1).  ? ?F. LYMPH NODE, 7 #3, EXCISION:  ?- One of one lymph nodes negative for carcinoma (0/1).  ? ?G. LYMPH NODE, 11, EXCISION:  ?- One of one lymph nodes negative for carcinoma (0/1).  ? ?H. LYMPH NODE, 10, EXCISION:  ?- One of one lymph nodes negative for carcinoma (0/1).  ? ?I. LYMPH NODE, 4R, EXCISION:  ?- One of one lymph nodes negative for carcinoma (0/1).  ? ?J. LYMPH NODE, 10 #2, EXCISION:  ? ?- One of one lymph nodes negative for carcinoma (0/1).  ?- Fibrosis and calcifications.  ? ?K. LYMPH NODE, 12, EXCISION:  ?- One of one lymph nodes negative for carcinoma (0/1).  ? ?L. LYMPH NODE, 12 #2, EXCISION:  ?- One of one  lymph nodes negative for carcinoma (0/1).  ? ?ONCOLOGY TABLE:  ? ?LUNG: Resection  ? ?Synchronous Tumors: Not applicable  ?Total Number of Primary Tumors: 1  ?Procedure: Right upper lobe lobectomy with lymph node biopsies  ?Specimen Laterality: Right  ?Tumor Focality: Unifocal  ?Tumor Site: Right upper lobe  ?Tumor Size: 4.4 cm  ?Histologic Type: Large cell neuroendocrine carcinoma  ?Visceral Pleura Invasion: Not identified  ?Direct Invasion of Adjacent Structures: No adjacent structures present  ?Lymphovascular Invasion: Not identified  ?Margins: All margins negative for invasive carcinoma  ?     Closest Margin(s) to Invasive Carcinoma: 1.5 cm to bronchial margin  ?Treatment Effect: No known presurgical therapy  ?Regional Lymph Nodes:  ?     Number of Lymph Nodes Involved: 0  ?                          Nodal Sites with Tumor: Not applicable  ?     Number of Lymph Nodes Examined: 12  ?                     Nodal Sites Examined: 4, 7, 8, 9, 10, 11, 12  ?Distant Metastasis:  ?     Distant Site(s) Involved: Not applicable  ?Pathologic Stage Classification (pTNM, AJCC 8th  Edition): pT2b, pN0  ?Ancillary Studies: Can be performed upon request  ?Representative Tumor Block: A2  ?Comment(s): The tumor has a subtle neuroendocrine features, cribriform areas with necrosis, and geographic areas of necrosis.  ?Immunohistochemistry is positive for CD56, synaptophysin, and TTF-1.  ?Chromogranin is largely negative. Ki-67 is elevated. Dr. Saralyn Pilar has reviewed the case.  ? ?  ?08/15/2020 Surgery  ? PREOPERATIVE DIAGNOSIS:  Right upper lobe lung mass, suspected non-small cell carcinoma. ?  ?POSTOPERATIVE DIAGNOSIS:  Non-small cell carcinoma, right upper lobe, clinical stage IB (T2, N0). ?  ?PROCEDURE:   ?Xi robotic-assisted right upper lobectomy,  ?Lymph node dissection,  ?Intercostal nerve blocks levels 3 through 10. ?  ?SURGEON:  Modesto Charon, MD ?   ?FINDINGS:  Fragile tissue.  Enlarged but otherwise benign appearing nodes.   One calcified node.  Frozen section revealed non-small cell carcinoma.  Bronchial margin clear. ?  ?09/04/2020 Initial Diagnosis  ? Cancer of upper lobe of right lung [facility]) ?  ?09/04/2020 Cancer Staging  ? Staging form: Lung, AJCC 8th Edition ?- Pathologic stage from 09/04/2020: pT2, pN0, cM0 - Signed by Heath Lark, MD on 09/04/2020 ?Stage prefix: Initial diagnosis ? ?  ?09/11/2020 Genetic Testing  ? PD-L1 Results: ? ? ?  ?12/11/2020 Cancer Staging  ? Staging form: Lung, AJCC 8th Edition ?- Pathologic stage from 12/11/2020: Stage IVB (pT2b, pN0, pM1c) - Signed by Derek Jack, MD on 12/11/2020 ?Histopathologic type: Large cell neuroendocrine carcinoma ? ?  ?12/17/2020 -  Chemotherapy  ? Patient is on Treatment Plan : LUNG NSCLC Carboplatin + Paclitaxel + Pembrolizumab q21d x 4 cycles / Pembrolizumab Maintenance Q21D  ?   ? ? ?CANCER STAGING: ? Cancer Staging  ?Cancer of upper lobe of right lung ([place]) ?Staging form: Lung, AJCC 8th Edition ?- Pathologic stage from 09/04/2020: pT2, pN0, cM0 - Signed by Heath Lark, MD on 09/04/2020 ?- Pathologic stage from 12/11/2020: Stage IVB (pT2b, pN0, pM1c) - Signed by Derek Jack, MD on 12/11/2020 ? ? ?INTERVAL HISTORY:  ?[name], a 81 y.o. female, returns for routine follow-up and consideration for next cycle of chemotherapy. Katelyn [name] was last seen on 04/22/2021. ? ?Due for cycle #8 of Keytruda today.  ? ?Overall, she tells me she has been feeling pretty well. She denies diarrhea and skin rash. She has lost 9 lbs since 05/05/21. Her appetite is poor, and she has not been eating well. She reports nausea. She reports she has not been consistently drinking boost, and has been drinking about 3 bottles daily. She reports SOB with exertion and a dry cough starting 1 week ago which is worse in the evening. She reports previously tolerating prednisone well without anxiety or disrupted sleep.  ? ?Overall, she will not receive her next cycle of chemo today.  ? ?REVIEW OF  SYSTEMS:  ?Review of Systems  ?Constitutional:  Positive for appetite change and fatigue.  ?Respiratory:  Positive for cough and shortness of breath.   ?Gastrointestinal:  Positive for nausea. Negative for diarrhea.  ?Skin:  Negative for rash.  ?Neurological:  Positive for numbness.  ?All other systems reviewed and are negative. ? ?PAST MEDICAL/SURGICAL HISTORY:  ?Past Medical History:  ?Diagnosis Date  ? Allergy   ? rhinitis  ? Aortic stenosis, severe   ? a. s/p TAVR 08/2014.  ? Arthritis   ? spine and various joints  ? Cancer of upper lobe of right lung ([place]) 09/04/2020  ? Carotid artery disease ([place])   ? Cataract   ? Cerebrovascular  disease 12/05/2018  ? Childhood asthma   ? Chronic bronchitis ([place])   ?    ? Chronic kidney disease 02/17/2021  ? Claudication [facility])   ? 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 Coronary artery disease   ? a. April 2012 which revealed mid RCA occlusion with collaterals, 50% LAD stenosis, 30% circumflex stenosis, and 40% marginal stenosis  ? First degree AV block   ? GERD (gastroesophageal reflux disease)   ? Heart murmur   ? Hyperlipidemia   ? Hypertension   ? Hypothyroidism 11/14/2020  ? Left bundle branch block   ? LUMBAR RADICULOPATHY, RIGHT   ? PAF (paroxysmal atrial fibrillation) ([place])   ? Peripheral vascular disease ([place])   ? a. s/p aortobifem bypass graft surgery and bilateral fundoplasty by [facility].  ? Renal artery stenosis ([place])   ? a. renal artery stenosis (>60% L renal artery) by duplex 04/2020.  ? 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 26 mm Edwards Sapien 3 transcatheter heart valve placed via open right transfemoral approach  ? Seroma, postoperative   ? Left Groin  ? Stroke [facility]) 1982 X 2  ? "a little bit weaker on the left side since" (08/29/2014)  ? 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 a. bilateral subclavian stenosis with antegrade flow by duplex 07/2020  ? ?Past Surgical History:  ?Procedure Laterality Date  ? AORTO-FEMORAL BYPASS GRAFT Bilateral 06/27/10  ? BASAL  CELL CARCINOMA EXCISION Left   ? face  ? CARDIAC CATHETERIZATION N/A 07/25/2014  ? Procedure: Right/Left Heart Cath and Coronary Angiography;  Surgeon: Troy Sine, MD;  Location: [place] CV LAB;  Servic

## 2021-05-13 NOTE — Patient Instructions (Signed)
[place] at [facility] ?Discharge Instructions ? ? ?You were seen and examined today by Dr. Delton Coombes. ? ?We will hold your treatment due to inflammation in your lungs caused by the treatment.  We sent prednisone to your pharmacy.  Take as prescribed until we see you back in 2 weeks.  ? ? ?Thank you for choosing [place] at [facility] to provide your oncology and hematology care.  To afford each patient quality time with our provider, please arrive at least 15 minutes before your scheduled appointment time.  ? ?If you have a lab appointment with the [facility] please come in thru the Main Entrance and check in at the main information desk. ? ?You need to re-schedule your appointment should you arrive 10 or more minutes late.  We strive to give you quality time with our providers, and arriving late affects you and other patients whose appointments are after yours.  Also, if you no show three or more times for appointments you may be dismissed from the clinic at the providers discretion.     ?Again, thank you for choosing [facility].  Our hope is that these requests will decrease the amount of time that you wait before being seen by our physicians.       ?_____________________________________________________________ ? ?Should you have questions after your visit to [facility], please contact our office at [phone] and follow the prompts.  Our office hours are 8:00 a.m. and 4:30 p.m. Monday - Friday.  Please note that voicemails left after 4:00 p.m. may not be returned until the following business day.  We are closed weekends and major holidays.  You do have access to a nurse 24-7, just call the main number to the clinic [phone] and do not press any options, hold on the line and a nurse will answer the phone.   ? ?For prescription refill requests, have your pharmacy contact our office and allow 72 hours.   ? ?Due to Covid, you  will need to wear a mask upon entering the hospital. If you do not have a mask, a mask will be given to you at the Main Entrance upon arrival. For doctor visits, patients may have 1 support person age 64 or older with them. For treatment visits, patients can not have anyone with them due to social distancing guidelines and our immunocompromised population.  ? ?   ?

## 2021-05-14 ENCOUNTER — Ambulatory Visit (INDEPENDENT_AMBULATORY_CARE_PROVIDER_SITE_OTHER): Payer: Medicare Other | Admitting: Pharmacist Clinician (PhC)/ Clinical Pharmacy Specialist

## 2021-05-14 DIAGNOSIS — I4891 Unspecified atrial fibrillation: Secondary | ICD-10-CM

## 2021-05-14 DIAGNOSIS — Z7901 Long term (current) use of anticoagulants: Secondary | ICD-10-CM

## 2021-05-14 LAB — POCT INR: INR: 2.6 (ref 2.0–3.0)

## 2021-05-20 LAB — POCT INR: INR: 2.3 (ref 2.0–3.0)

## 2021-05-21 ENCOUNTER — Ambulatory Visit (INDEPENDENT_AMBULATORY_CARE_PROVIDER_SITE_OTHER): Payer: Medicare Other | Admitting: Cardiovascular Disease

## 2021-05-21 DIAGNOSIS — Z7901 Long term (current) use of anticoagulants: Secondary | ICD-10-CM | POA: Diagnosis not present

## 2021-05-27 ENCOUNTER — Inpatient Hospital Stay (HOSPITAL_COMMUNITY): Payer: Medicare Other

## 2021-05-27 ENCOUNTER — Inpatient Hospital Stay (HOSPITAL_BASED_OUTPATIENT_CLINIC_OR_DEPARTMENT_OTHER): Payer: Medicare Other | Admitting: Hematology

## 2021-05-27 ENCOUNTER — Ambulatory Visit (INDEPENDENT_AMBULATORY_CARE_PROVIDER_SITE_OTHER): Payer: Medicare Other | Admitting: Cardiology

## 2021-05-27 ENCOUNTER — Other Ambulatory Visit: Payer: Self-pay

## 2021-05-27 ENCOUNTER — Ambulatory Visit (HOSPITAL_COMMUNITY): Payer: Medicare Other

## 2021-05-27 VITALS — BP 150/83 | HR 57 | Temp 97.9°F | Resp 18 | Ht 68.5 in | Wt 148.5 lb

## 2021-05-27 DIAGNOSIS — R5383 Other fatigue: Secondary | ICD-10-CM | POA: Diagnosis not present

## 2021-05-27 DIAGNOSIS — C3411 Malignant neoplasm of upper lobe, right bronchus or lung: Secondary | ICD-10-CM | POA: Diagnosis not present

## 2021-05-27 DIAGNOSIS — C3490 Malignant neoplasm of unspecified part of unspecified bronchus or lung: Secondary | ICD-10-CM | POA: Diagnosis not present

## 2021-05-27 DIAGNOSIS — G479 Sleep disorder, unspecified: Secondary | ICD-10-CM | POA: Diagnosis not present

## 2021-05-27 DIAGNOSIS — Z5181 Encounter for therapeutic drug level monitoring: Secondary | ICD-10-CM

## 2021-05-27 DIAGNOSIS — I2510 Atherosclerotic heart disease of native coronary artery without angina pectoris: Secondary | ICD-10-CM | POA: Diagnosis not present

## 2021-05-27 DIAGNOSIS — R200 Anesthesia of skin: Secondary | ICD-10-CM | POA: Diagnosis not present

## 2021-05-27 DIAGNOSIS — R059 Cough, unspecified: Secondary | ICD-10-CM | POA: Diagnosis not present

## 2021-05-27 LAB — CBC WITH DIFFERENTIAL/PLATELET
Abs Immature Granulocytes: 0.09 10*3/uL — ABNORMAL HIGH (ref 0.00–0.07)
Basophils Absolute: 0 10*3/uL (ref 0.0–0.1)
Basophils Relative: 0 %
Eosinophils Absolute: 0 10*3/uL (ref 0.0–0.5)
Eosinophils Relative: 0 %
HCT: 44.2 % (ref 36.0–46.0)
Hemoglobin: 14 g/dL (ref 12.0–15.0)
Immature Granulocytes: 1 %
Lymphocytes Relative: 5 %
Lymphs Abs: 0.7 10*3/uL (ref 0.7–4.0)
MCH: 30.7 pg (ref 26.0–34.0)
MCHC: 31.7 g/dL (ref 30.0–36.0)
MCV: 96.9 fL (ref 80.0–100.0)
Monocytes Absolute: 0.9 10*3/uL (ref 0.1–1.0)
Monocytes Relative: 6 %
Neutro Abs: 12.8 10*3/uL — ABNORMAL HIGH (ref 1.7–7.7)
Neutrophils Relative %: 88 %
Platelets: 126 10*3/uL — ABNORMAL LOW (ref 150–400)
RBC: 4.56 MIL/uL (ref 3.87–5.11)
RDW: 14.1 % (ref 11.5–15.5)
WBC: 14.5 10*3/uL — ABNORMAL HIGH (ref 4.0–10.5)
nRBC: 0 % (ref 0.0–0.2)

## 2021-05-27 LAB — COMPREHENSIVE METABOLIC PANEL
ALT: 60 U/L — ABNORMAL HIGH (ref 0–44)
AST: 26 U/L (ref 15–41)
Albumin: 3 g/dL — ABNORMAL LOW (ref 3.5–5.0)
Alkaline Phosphatase: 98 U/L (ref 38–126)
Anion gap: 3 — ABNORMAL LOW (ref 5–15)
BUN: 31 mg/dL — ABNORMAL HIGH (ref 8–23)
CO2: 29 mmol/L (ref 22–32)
Calcium: 8.1 mg/dL — ABNORMAL LOW (ref 8.9–10.3)
Chloride: 105 mmol/L (ref 98–111)
Creatinine, Ser: 1 mg/dL (ref 0.44–1.00)
GFR, Estimated: 58 mL/min — ABNORMAL LOW (ref >60)
Glucose, Bld: 92 mg/dL (ref 70–99)
Potassium: 4 mmol/L (ref 3.5–5.1)
Sodium: 137 mmol/L (ref 135–145)
Total Bilirubin: 0.5 mg/dL (ref 0.3–1.2)
Total Protein: 5.6 g/dL — ABNORMAL LOW (ref 6.5–8.1)

## 2021-05-27 LAB — POCT INR: INR: 3 (ref 2.0–3.0)

## 2021-05-27 LAB — MAGNESIUM: Magnesium: 2.1 mg/dL (ref 1.7–2.4)

## 2021-05-27 NOTE — Patient Instructions (Addendum)
[place] at [facility] ?Discharge Instructions ? ? ?You were seen and examined today by Dr. Delton Coombes. ? ?He reviewed the results of your lab work which are normal/stable. ? ?Decrease the prednisone to 2 pills (40 mg) daily x 1 week, then 1 pill daily until you see [place] again. ? ?Return as scheduled in 2 weeks.  ? ? ? ? ?Thank you for choosing [place] at [facility] to provide your oncology and hematology care.  To afford each patient quality time with our provider, please arrive at least 15 minutes before your scheduled appointment time.  ? ?If you have a lab appointment with the [facility] please come in thru the Main Entrance and check in at the main information desk. ? ?You need to re-schedule your appointment should you arrive 10 or more minutes late.  We strive to give you quality time with our providers, and arriving late affects you and other patients whose appointments are after yours.  Also, if you no show three or more times for appointments you may be dismissed from the clinic at the providers discretion.     ?Again, thank you for choosing [facility].  Our hope is that these requests will decrease the amount of time that you wait before being seen by our physicians.       ?_____________________________________________________________ ? ?Should you have questions after your visit to [facility], please contact our office at [phone] and follow the prompts.  Our office hours are 8:00 a.m. and 4:30 p.m. Monday - Friday.  Please note that voicemails left after 4:00 p.m. may not be returned until the following business day.  We are closed weekends and major holidays.  You do have access to a nurse 24-7, just call the main number to the clinic [phone] and do not press any options, hold on the line and a nurse will answer the phone.   ? ?For prescription refill requests, have your pharmacy contact our office and allow 72  hours.   ? ?Due to Covid, you will need to wear a mask upon entering the hospital. If you do not have a mask, a mask will be given to you at the Main Entrance upon arrival. For doctor visits, patients may have 1 support person age 70 or older with them. For treatment visits, patients can not have anyone with them due to social distancing guidelines and our immunocompromised population.  ? ?   ?

## 2021-05-27 NOTE — Progress Notes (Signed)
? ?[facility] ?[address] ?[place], [place] [address] ? ? ?CLINIC:  ?Medical Oncology/Hematology ? ?PCP:  ?Celene Squibb, MD ?[address] [place] [address] ?[phone] ? ? ?REASON FOR VISIT:  ?Follow-up for right lung cancer ? ?PRIOR THERAPY: Right upper lobectomy (08/15/20) ? ?NGS Results: not done ? ?CURRENT THERAPY: Carboplatin, Taxol, and Keytruda every 3 weeks x 4 cycles; to be followed by Beryle Flock maintenance every 3 weeks ? ?BRIEF ONCOLOGIC HISTORY:  ?Oncology History Overview Note  ?Foundation One PD-L1 TPS score 1 ?16 mut/MB ?KRASG12C mutation ?MSI stable ?  ?Cancer of upper lobe of right lung ([place])  ?08/15/2020 Pathology Results  ? FINAL MICROSCOPIC DIAGNOSIS:  ? ?A. 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  ?- Tumor is limited to lung.  ?- Margins are negative for carcinoma.  ?- One of one lymph nodes negative for carcinoma (0/1).  ?- See oncology table.  ? ?B. LYMPH NODE, 7, EXCISION:  ?- One of one lymph nodes negative for carcinoma (0/1).  ? ?C. LYMPH NODE, 7 #2, EXCISION:  ?- One of one lymph nodes negative for carcinoma (0/1).  ? ?D. LYMPH NODE, 9, EXCISION:  ?- One of one lymph nodes negative for carcinoma (0/1).  ? ?E. LYMPH NODE, 8, EXCISION:  ?- One of one lymph nodes negative for carcinoma (0/1).  ? ?F. LYMPH NODE, 7 #3, EXCISION:  ?- One of one lymph nodes negative for carcinoma (0/1).  ? ?G. LYMPH NODE, 11, EXCISION:  ?- One of one lymph nodes negative for carcinoma (0/1).  ? ?H. LYMPH NODE, 10, EXCISION:  ?- One of one lymph nodes negative for carcinoma (0/1).  ? ?I. LYMPH NODE, 4R, EXCISION:  ?- One of one lymph nodes negative for carcinoma (0/1).  ? ?J. LYMPH NODE, 10 #2, EXCISION:  ? ?- One of one lymph nodes negative for carcinoma (0/1).  ?- Fibrosis and calcifications.  ? ?K. LYMPH NODE, 12, EXCISION:  ?- One of one lymph nodes negative for carcinoma (0/1).  ? ?L. LYMPH NODE, 12 #2, EXCISION:  ?- One of one  lymph nodes negative for carcinoma (0/1).  ? ?ONCOLOGY TABLE:  ? ?LUNG: Resection  ? ?Synchronous Tumors: Not applicable  ?Total Number of Primary Tumors: 1  ?Procedure: Right upper lobe lobectomy with lymph node biopsies  ?Specimen Laterality: Right  ?Tumor Focality: Unifocal  ?Tumor Site: Right upper lobe  ?Tumor Size: 4.4 cm  ?Histologic Type: Large cell neuroendocrine carcinoma  ?Visceral Pleura Invasion: Not identified  ?Direct Invasion of Adjacent Structures: No adjacent structures present  ?Lymphovascular Invasion: Not identified  ?Margins: All margins negative for invasive carcinoma  ?     Closest Margin(s) to Invasive Carcinoma: 1.5 cm to bronchial margin  ?Treatment Effect: No known presurgical therapy  ?Regional Lymph Nodes:  ?     Number of Lymph Nodes Involved: 0  ?                          Nodal Sites with Tumor: Not applicable  ?     Number of Lymph Nodes Examined: 12  ?                     Nodal Sites Examined: 4, 7, 8, 9, 10, 11, 12  ?Distant Metastasis:  ?     Distant Site(s) Involved: Not applicable  ?Pathologic Stage Classification (pTNM, AJCC 8th  Edition): pT2b, pN0  ?Ancillary Studies: Can be performed upon request  ?Representative Tumor Block: A2  ?Comment(s): The tumor has a subtle neuroendocrine features, cribriform areas with necrosis, and geographic areas of necrosis.  ?Immunohistochemistry is positive for CD56, synaptophysin, and TTF-1.  ?Chromogranin is largely negative. Ki-67 is elevated. Dr. Saralyn Pilar has reviewed the case.  ? ?  ?08/15/2020 Surgery  ? PREOPERATIVE DIAGNOSIS:  Right upper lobe lung mass, suspected non-small cell carcinoma. ?  ?POSTOPERATIVE DIAGNOSIS:  Non-small cell carcinoma, right upper lobe, clinical stage IB (T2, N0). ?  ?PROCEDURE:   ?Xi robotic-assisted right upper lobectomy,  ?Lymph node dissection,  ?Intercostal nerve blocks levels 3 through 10. ?  ?SURGEON:  Modesto Charon, MD ?   ?FINDINGS:  Fragile tissue.  Enlarged but otherwise benign appearing nodes.   One calcified node.  Frozen section revealed non-small cell carcinoma.  Bronchial margin clear. ?  ?09/04/2020 Initial Diagnosis  ? Cancer of upper lobe of right lung [facility]) ?  ?09/04/2020 Cancer Staging  ? Staging form: Lung, AJCC 8th Edition ?- Pathologic stage from 09/04/2020: pT2, pN0, cM0 - Signed by Heath Lark, MD on 09/04/2020 ?Stage prefix: Initial diagnosis ? ?  ?09/11/2020 Genetic Testing  ? PD-L1 Results: ? ? ?  ?12/11/2020 Cancer Staging  ? Staging form: Lung, AJCC 8th Edition ?- Pathologic stage from 12/11/2020: Stage IVB (pT2b, pN0, pM1c) - Signed by Derek Jack, MD on 12/11/2020 ?Histopathologic type: Large cell neuroendocrine carcinoma ? ?  ?12/17/2020 -  Chemotherapy  ? Patient is on Treatment Plan : LUNG NSCLC Carboplatin + Paclitaxel + Pembrolizumab q21d x 4 cycles / Pembrolizumab Maintenance Q21D  ?   ? ? ?CANCER STAGING: ? Cancer Staging  ?Cancer of upper lobe of right lung ([place]) ?Staging form: Lung, AJCC 8th Edition ?- Pathologic stage from 09/04/2020: pT2, pN0, cM0 - Signed by Heath Lark, MD on 09/04/2020 ?- Pathologic stage from 12/11/2020: Stage IVB (pT2b, pN0, pM1c) - Signed by Derek Jack, MD on 12/11/2020 ? ? ?INTERVAL HISTORY:  ?Katelyn [name], a 81 y.o. female, returns for routine follow-up of her right lung cancer. Katelyn [name] was last seen on 05/13/2021.  ? ?Today she reports feeling good. Her appetite has improved, and she has gained 7 lbs since starting prednisone. She continues to have low energy. She denies SOB and reports continued dry cough which is worse at night. She denies acid reflux and stomach pain.  ? ?REVIEW OF SYSTEMS:  ?Review of Systems  ?Constitutional:  Positive for fatigue. Negative for appetite change and unexpected weight change (+7 lbs).  ?Respiratory:  Positive for cough. Negative for shortness of breath.   ?Gastrointestinal:  Negative for abdominal pain.  ?Neurological:  Positive for numbness.  ?Psychiatric/Behavioral:  Positive for sleep disturbance.    ?All other systems reviewed and are negative. ? ?PAST MEDICAL/SURGICAL HISTORY:  ?Past Medical History:  ?Diagnosis Date  ? Allergy   ? rhinitis  ? Aortic stenosis, severe   ? a. s/p TAVR 08/2014.  ? Arthritis   ? spine and various joints  ? Cancer of upper lobe of right lung ([place]) 09/04/2020  ? Carotid artery disease ([place])   ? Cataract   ? Cerebrovascular disease 12/05/2018  ? Childhood asthma   ? Chronic bronchitis ([place])   ?    ? Chronic kidney disease 02/17/2021  ? Claudication [facility])   ? 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 Coronary artery disease   ? a. April 2012 which revealed mid RCA occlusion with collaterals, 50% LAD stenosis,  30% circumflex stenosis, and 40% marginal stenosis  ? First degree AV block   ? GERD (gastroesophageal reflux disease)   ? Heart murmur   ? Hyperlipidemia   ? Hypertension   ? Hypothyroidism 11/14/2020  ? Left bundle branch block   ? LUMBAR RADICULOPATHY, RIGHT   ? PAF (paroxysmal atrial fibrillation) ([place])   ? Peripheral vascular disease ([place])   ? a. s/p aortobifem bypass graft surgery and bilateral fundoplasty by [facility].  ? Renal artery stenosis ([place])   ? a. renal artery stenosis (>60% L renal artery) by duplex 04/2020.  ? 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 26 mm Edwards Sapien 3 transcatheter heart valve placed via open right transfemoral approach  ? Seroma, postoperative   ? Left Groin  ? Stroke [facility]) 1982 X 2  ? "a little bit weaker on the left side since" (08/29/2014)  ? 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 a. bilateral subclavian stenosis with antegrade flow by duplex 07/2020  ? ?Past Surgical History:  ?Procedure Laterality Date  ? AORTO-FEMORAL BYPASS GRAFT Bilateral 06/27/10  ? BASAL CELL CARCINOMA EXCISION Left   ? face  ? CARDIAC CATHETERIZATION N/A 07/25/2014  ? Procedure: Right/Left Heart Cath and Coronary Angiography;  Surgeon: Troy Sine, MD;  Location: [place] [facility];  Service: Cardiovascular;  Laterality: N/A;  ? CATARACT EXTRACTION, BILATERAL  Bilateral 09/29/2016  ? COLONOSCOPY  2008  ? Dr. Laural Golden: normal.   ? DILATION AND CURETTAGE OF UTERUS  "2 or 3"  ? ESOPHAGOGASTRODUODENOSCOPY N/A 08/18/2017  ? Dr. Gala Romney: Normal-appearing esophagus status post dil

## 2021-05-27 NOTE — Patient Instructions (Addendum)
Description   ?Called and spoke to pt and instructed her to continue taking warfarin 1 tablet (2.5mg ) daily. Recheck INR in 1 week. [facility] [phone] ?  ?  ?

## 2021-06-02 ENCOUNTER — Telehealth (HOSPITAL_COMMUNITY): Payer: Self-pay | Admitting: *Deleted

## 2021-06-02 NOTE — Telephone Encounter (Signed)
Patient called to advise that she has dry nagging cough and has developed soreness in chest and back. Denies fever or productive cough.  Advised to continue cough drops, and possibly some hot tea with honey, along with mild over the counter remedies. Katelyn [name] is on hold due to pneumonitis and patient made aware to notify us if she develops fever or productive cough. Verbalized understanding. ?

## 2021-06-03 ENCOUNTER — Ambulatory Visit (HOSPITAL_COMMUNITY): Payer: Medicare Other

## 2021-06-03 ENCOUNTER — Other Ambulatory Visit (HOSPITAL_COMMUNITY): Payer: Medicare Other

## 2021-06-03 ENCOUNTER — Ambulatory Visit (HOSPITAL_COMMUNITY): Payer: Medicare Other | Admitting: Hematology

## 2021-06-03 LAB — POCT INR: INR: 2.8 (ref 2.0–3.0)

## 2021-06-04 ENCOUNTER — Ambulatory Visit (INDEPENDENT_AMBULATORY_CARE_PROVIDER_SITE_OTHER): Payer: Medicare Other | Admitting: Cardiovascular Disease

## 2021-06-04 DIAGNOSIS — I4891 Unspecified atrial fibrillation: Secondary | ICD-10-CM

## 2021-06-04 DIAGNOSIS — Z7901 Long term (current) use of anticoagulants: Secondary | ICD-10-CM

## 2021-06-10 ENCOUNTER — Inpatient Hospital Stay (HOSPITAL_COMMUNITY): Payer: Medicare Other

## 2021-06-10 ENCOUNTER — Ambulatory Visit (HOSPITAL_COMMUNITY): Payer: Medicare Other | Admitting: Hematology

## 2021-06-11 ENCOUNTER — Ambulatory Visit (INDEPENDENT_AMBULATORY_CARE_PROVIDER_SITE_OTHER): Payer: Medicare Other | Admitting: Cardiology

## 2021-06-11 ENCOUNTER — Telehealth: Payer: Self-pay

## 2021-06-11 DIAGNOSIS — Z7901 Long term (current) use of anticoagulants: Secondary | ICD-10-CM | POA: Diagnosis not present

## 2021-06-11 LAB — POCT INR: INR: 2.6 (ref 2.0–3.0)

## 2021-06-11 NOTE — Telephone Encounter (Signed)
Lpm to check INR 

## 2021-06-12 ENCOUNTER — Inpatient Hospital Stay (HOSPITAL_BASED_OUTPATIENT_CLINIC_OR_DEPARTMENT_OTHER): Payer: Medicare Other | Admitting: Hematology

## 2021-06-12 ENCOUNTER — Inpatient Hospital Stay (HOSPITAL_COMMUNITY): Payer: Medicare Other | Attending: Hematology

## 2021-06-12 ENCOUNTER — Ambulatory Visit (HOSPITAL_COMMUNITY)
Admission: RE | Admit: 2021-06-12 | Discharge: 2021-06-12 | Disposition: A | Discharge status: Home / Self Care | Payer: Medicare Other | Source: Ambulatory Visit | Attending: Hematology | Admitting: Hematology

## 2021-06-12 VITALS — BP 133/97 | HR 78 | Temp 98.4°F | Resp 24

## 2021-06-12 DIAGNOSIS — C3490 Malignant neoplasm of unspecified part of unspecified bronchus or lung: Secondary | ICD-10-CM | POA: Insufficient documentation

## 2021-06-12 DIAGNOSIS — E039 Hypothyroidism, unspecified: Secondary | ICD-10-CM | POA: Insufficient documentation

## 2021-06-12 DIAGNOSIS — Z87891 Personal history of nicotine dependence: Secondary | ICD-10-CM | POA: Insufficient documentation

## 2021-06-12 DIAGNOSIS — R059 Cough, unspecified: Secondary | ICD-10-CM | POA: Diagnosis not present

## 2021-06-12 DIAGNOSIS — C3411 Malignant neoplasm of upper lobe, right bronchus or lung: Secondary | ICD-10-CM | POA: Insufficient documentation

## 2021-06-12 DIAGNOSIS — R110 Nausea: Secondary | ICD-10-CM | POA: Diagnosis not present

## 2021-06-12 DIAGNOSIS — R0602 Shortness of breath: Secondary | ICD-10-CM | POA: Diagnosis not present

## 2021-06-12 LAB — COMPREHENSIVE METABOLIC PANEL
ALT: 29 U/L (ref 0–44)
AST: 29 U/L (ref 15–41)
Albumin: 2.5 g/dL — ABNORMAL LOW (ref 3.5–5.0)
Alkaline Phosphatase: 164 U/L — ABNORMAL HIGH (ref 38–126)
Anion gap: 11 (ref 5–15)
BUN: 18 mg/dL (ref 8–23)
CO2: 25 mmol/L (ref 22–32)
Calcium: 8.5 mg/dL — ABNORMAL LOW (ref 8.9–10.3)
Chloride: 103 mmol/L (ref 98–111)
Creatinine, Ser: 1.09 mg/dL — ABNORMAL HIGH (ref 0.44–1.00)
GFR, Estimated: 52 mL/min — ABNORMAL LOW (ref >60)
Glucose, Bld: 156 mg/dL — ABNORMAL HIGH (ref 70–99)
Potassium: 4.7 mmol/L (ref 3.5–5.1)
Sodium: 139 mmol/L (ref 135–145)
Total Bilirubin: 0.8 mg/dL (ref 0.3–1.2)
Total Protein: 6.5 g/dL (ref 6.5–8.1)

## 2021-06-12 LAB — CBC WITH DIFFERENTIAL/PLATELET
Abs Immature Granulocytes: 0.08 10*3/uL — ABNORMAL HIGH (ref 0.00–0.07)
Basophils Absolute: 0 10*3/uL (ref 0.0–0.1)
Basophils Relative: 0 %
Eosinophils Absolute: 0.1 10*3/uL (ref 0.0–0.5)
Eosinophils Relative: 1 %
HCT: 41.2 % (ref 36.0–46.0)
Hemoglobin: 13 g/dL (ref 12.0–15.0)
Immature Granulocytes: 1 %
Lymphocytes Relative: 3 %
Lymphs Abs: 0.3 10*3/uL — ABNORMAL LOW (ref 0.7–4.0)
MCH: 29.3 pg (ref 26.0–34.0)
MCHC: 31.6 g/dL (ref 30.0–36.0)
MCV: 92.8 fL (ref 80.0–100.0)
Monocytes Absolute: 1 10*3/uL (ref 0.1–1.0)
Monocytes Relative: 9 %
Neutro Abs: 10.1 10*3/uL — ABNORMAL HIGH (ref 1.7–7.7)
Neutrophils Relative %: 86 %
Platelets: 219 10*3/uL (ref 150–400)
RBC: 4.44 MIL/uL (ref 3.87–5.11)
RDW: 15 % (ref 11.5–15.5)
WBC: 11.6 10*3/uL — ABNORMAL HIGH (ref 4.0–10.5)
nRBC: 0 % (ref 0.0–0.2)

## 2021-06-12 LAB — MAGNESIUM: Magnesium: 1.8 mg/dL (ref 1.7–2.4)

## 2021-06-12 LAB — TSH: TSH: 1.797 u[IU]/mL (ref 0.350–4.500)

## 2021-06-12 MED ORDER — ALBUTEROL SULFATE HFA 108 (90 BASE) MCG/ACT IN AERS: 2.0000 | INHALATION_SPRAY | Freq: Two times a day (BID) | RESPIRATORY_TRACT | 2 refills | Status: AC | PRN

## 2021-06-12 MED ORDER — PREDNISONE 20 MG PO TABS: 20.0000 mg | ORAL_TABLET | Freq: Every day | ORAL | 0 refills | Status: DC

## 2021-06-12 NOTE — Progress Notes (Signed)
? ?[facility] ?[address] ?[place], [place] [address] ? ? ?CLINIC:  ?Medical Oncology/Hematology ? ?PCP:  ?Celene Squibb, MD ?[address] [place] [address] ?[phone] ? ? ?REASON FOR VISIT:  ?Follow-up for right lung cancer ? ?PRIOR THERAPY: Right upper lobectomy (08/15/20) ? ?NGS Results: not done ? ?CURRENT THERAPY: Carboplatin, Taxol, and Keytruda every 3 weeks x 4 cycles; to be followed by Beryle Flock maintenance every 3 weeks ? ?BRIEF ONCOLOGIC HISTORY:  ?Oncology History Overview Note  ?Foundation One PD-L1 TPS score 1 ?16 mut/MB ?KRASG12C mutation ?MSI stable ?  ?Cancer of upper lobe of right lung ([place])  ?08/15/2020 Pathology Results  ? FINAL MICROSCOPIC DIAGNOSIS:  ? ?A. 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  ?- Tumor is limited to lung.  ?- Margins are negative for carcinoma.  ?- One of one lymph nodes negative for carcinoma (0/1).  ?- See oncology table.  ? ?B. LYMPH NODE, 7, EXCISION:  ?- One of one lymph nodes negative for carcinoma (0/1).  ? ?C. LYMPH NODE, 7 #2, EXCISION:  ?- One of one lymph nodes negative for carcinoma (0/1).  ? ?D. LYMPH NODE, 9, EXCISION:  ?- One of one lymph nodes negative for carcinoma (0/1).  ? ?E. LYMPH NODE, 8, EXCISION:  ?- One of one lymph nodes negative for carcinoma (0/1).  ? ?F. LYMPH NODE, 7 #3, EXCISION:  ?- One of one lymph nodes negative for carcinoma (0/1).  ? ?G. LYMPH NODE, 11, EXCISION:  ?- One of one lymph nodes negative for carcinoma (0/1).  ? ?H. LYMPH NODE, 10, EXCISION:  ?- One of one lymph nodes negative for carcinoma (0/1).  ? ?I. LYMPH NODE, 4R, EXCISION:  ?- One of one lymph nodes negative for carcinoma (0/1).  ? ?J. LYMPH NODE, 10 #2, EXCISION:  ? ?- One of one lymph nodes negative for carcinoma (0/1).  ?- Fibrosis and calcifications.  ? ?K. LYMPH NODE, 12, EXCISION:  ?- One of one lymph nodes negative for carcinoma (0/1).  ? ?L. LYMPH NODE, 12 #2, EXCISION:  ?- One of one  lymph nodes negative for carcinoma (0/1).  ? ?ONCOLOGY TABLE:  ? ?LUNG: Resection  ? ?Synchronous Tumors: Not applicable  ?Total Number of Primary Tumors: 1  ?Procedure: Right upper lobe lobectomy with lymph node biopsies  ?Specimen Laterality: Right  ?Tumor Focality: Unifocal  ?Tumor Site: Right upper lobe  ?Tumor Size: 4.4 cm  ?Histologic Type: Large cell neuroendocrine carcinoma  ?Visceral Pleura Invasion: Not identified  ?Direct Invasion of Adjacent Structures: No adjacent structures present  ?Lymphovascular Invasion: Not identified  ?Margins: All margins negative for invasive carcinoma  ?     Closest Margin(s) to Invasive Carcinoma: 1.5 cm to bronchial margin  ?Treatment Effect: No known presurgical therapy  ?Regional Lymph Nodes:  ?     Number of Lymph Nodes Involved: 0  ?                          Nodal Sites with Tumor: Not applicable  ?     Number of Lymph Nodes Examined: 12  ?                     Nodal Sites Examined: 4, 7, 8, 9, 10, 11, 12  ?Distant Metastasis:  ?     Distant Site(s) Involved: Not applicable  ?Pathologic Stage Classification (pTNM, AJCC 8th  Edition): pT2b, pN0  ?Ancillary Studies: Can be performed upon request  ?Representative Tumor Block: A2  ?Comment(s): The tumor has a subtle neuroendocrine features, cribriform areas with necrosis, and geographic areas of necrosis.  ?Immunohistochemistry is positive for CD56, synaptophysin, and TTF-1.  ?Chromogranin is largely negative. Ki-67 is elevated. Dr. Saralyn Pilar has reviewed the case.  ? ?  ?08/15/2020 Surgery  ? PREOPERATIVE DIAGNOSIS:  Right upper lobe lung mass, suspected non-small cell carcinoma. ?  ?POSTOPERATIVE DIAGNOSIS:  Non-small cell carcinoma, right upper lobe, clinical stage IB (T2, N0). ?  ?PROCEDURE:   ?Xi robotic-assisted right upper lobectomy,  ?Lymph node dissection,  ?Intercostal nerve blocks levels 3 through 10. ?  ?SURGEON:  Modesto Charon, MD ?   ?FINDINGS:  Fragile tissue.  Enlarged but otherwise benign appearing nodes.   One calcified node.  Frozen section revealed non-small cell carcinoma.  Bronchial margin clear. ?  ?09/04/2020 Initial Diagnosis  ? Cancer of upper lobe of right lung [facility]) ?  ?09/04/2020 Cancer Staging  ? Staging form: Lung, AJCC 8th Edition ?- Pathologic stage from 09/04/2020: pT2, pN0, cM0 - Signed by Heath Lark, MD on 09/04/2020 ?Stage prefix: Initial diagnosis ? ?  ?09/11/2020 Genetic Testing  ? PD-L1 Results: ? ? ?  ?12/11/2020 Cancer Staging  ? Staging form: Lung, AJCC 8th Edition ?- Pathologic stage from 12/11/2020: Stage IVB (pT2b, pN0, pM1c) - Signed by Derek Jack, MD on 12/11/2020 ?Histopathologic type: Large cell neuroendocrine carcinoma ? ?  ?12/17/2020 -  Chemotherapy  ? Patient is on Treatment Plan : LUNG NSCLC Carboplatin + Paclitaxel + Pembrolizumab q21d x 4 cycles / Pembrolizumab Maintenance Q21D  ?   ? ? ?CANCER STAGING: ? Cancer Staging  ?Cancer of upper lobe of right lung ([place]) ?Staging form: Lung, AJCC 8th Edition ?- Pathologic stage from 09/04/2020: pT2, pN0, cM0 - Signed by Heath Lark, MD on 09/04/2020 ?- Pathologic stage from 12/11/2020: Stage IVB (pT2b, pN0, pM1c) - Signed by Derek Jack, MD on 12/11/2020 ? ? ?INTERVAL HISTORY:  ?Ms. [name], a 81 y.o. female, returns for routine follow-up of her right lung cancer. Marikay was last seen on 05/27/2021.  ? ?Today she reports feeling poorly, and she is accompanied by her granddaughter. She reports a cough productive of clear sputum, leg weakness, and SOB starting following reducing prednisone to 1 tablet a day. She stopped prednisone earlier this week. She is having increased difficulty walking. Her appetite is poor. She reports pain in her right side when coughing and when moving. Her cough has slightly improved. ? ?REVIEW OF SYSTEMS:  ?Review of Systems  ?Constitutional:  Positive for appetite change and fatigue.  ?Respiratory:  Positive for cough and shortness of breath.   ?Cardiovascular:  Positive for chest pain and  palpitations.  ?Musculoskeletal:  Positive for flank pain (R side).  ?Neurological:  Positive for extremity weakness (legs) and numbness.  ?All other systems reviewed and are negative. ? ?PAST MEDICAL/SURGICAL HISTORY:  ?Past Medical History:  ?Diagnosis Date  ? Allergy   ? rhinitis  ? Aortic stenosis, severe   ? a. s/p TAVR 08/2014.  ? Arthritis   ? spine and various joints  ? Cancer of upper lobe of right lung ([place]) 09/04/2020  ? Carotid artery disease ([place])   ? Cataract   ? Cerebrovascular disease 12/05/2018  ? Childhood asthma   ? Chronic bronchitis ([place])   ?    ? Chronic kidney disease 02/17/2021  ? Claudication [facility])   ? 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  Coronary artery disease   ? a. April 2012 which revealed mid RCA occlusion with collaterals, 50% LAD stenosis, 30% circumflex stenosis, and 40% marginal stenosis  ? First degree AV block   ? GERD (gastroesophageal reflux disease)   ? Heart murmur   ? Hyperlipidemia   ? Hypertension   ? Hypothyroidism 11/14/2020  ? Left bundle branch block   ? LUMBAR RADICULOPATHY, RIGHT   ? PAF (paroxysmal atrial fibrillation) ([place])   ? Peripheral vascular disease ([place])   ? a. s/p aortobifem bypass graft surgery and bilateral fundoplasty by VVS.  ? Renal artery stenosis ([place])   ? a. renal artery stenosis (>60% L renal artery) by duplex 04/2020.  ? 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 26 mm Edwards Sapien 3 transcatheter heart valve placed via open right transfemoral approach  ? Seroma, postoperative   ? Left Groin  ? Stroke [facility]) 1982 X 2  ? "a little bit weaker on the left side since" (08/29/2014)  ? 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 a. bilateral subclavian stenosis with antegrade flow by duplex 07/2020  ? ?Past Surgical History:  ?Procedure Laterality Date  ? AORTO-FEMORAL BYPASS GRAFT Bilateral 06/27/10  ? BASAL CELL CARCINOMA EXCISION Left   ? face  ? CARDIAC CATHETERIZATION N/A 07/25/2014  ? Procedure: Right/Left Heart Cath and Coronary Angiography;   Surgeon: Troy Sine, MD;  Location: [facility];  Service: Cardiovascular;  Laterality: N/A;  ? CATARACT EXTRACTION, BILATERAL Bilateral 09/29/2016  ? COLONOSCOPY  2008  ? Dr. Laural Golden: normal.   ? DILATION AND

## 2021-06-12 NOTE — Patient Instructions (Addendum)
[place] at [facility] ?Discharge Instructions ? ?You were seen and examined today by Dr. Delton Coombes. He reviewed your most recent labs and everything looks okay. Start taking prednisone three pills 60 mg for four days then two pills 40 mg for four days and then stay on one pill 20 mg daily. Please keep follow up appointments as scheduled in 2 weeks. ? ? ?Thank you for choosing [facility] at [facility] to provide your oncology and hematology care.  To afford each patient quality time with our provider, please arrive at least 15 minutes before your scheduled appointment time.  ? ?If you have a lab appointment with the [facility] please come in thru the Main Entrance and check in at the main information desk. ? ?You need to re-schedule your appointment should you arrive 10 or more minutes late.  We strive to give you quality time with our providers, and arriving late affects you and other patients whose appointments are after yours.  Also, if you no show three or more times for appointments you may be dismissed from the clinic at the providers discretion.     ?Again, thank you for choosing [facility].  Our hope is that these requests will decrease the amount of time that you wait before being seen by our physicians.       ?_____________________________________________________________ ? ?Should you have questions after your visit to [facility], please contact our office at [phone] and follow the prompts.  Our office hours are 8:00 a.m. and 4:30 p.m. Monday - Friday.  Please note that voicemails left after 4:00 p.m. may not be returned until the following business day.  We are closed weekends and major holidays.  You do have access to a nurse 24-7, just call the main number to the clinic [phone] and do not press any options, hold on the line and a nurse will answer the phone.   ? ?For prescription refill requests, have your  pharmacy contact our office and allow 72 hours.   ? ?Due to Covid, you will need to wear a mask upon entering the hospital. If you do not have a mask, a mask will be given to you at the Main Entrance upon arrival. For doctor visits, patients may have 1 support person age 78 or older with them. For treatment visits, patients can not have anyone with them due to social distancing guidelines and our immunocompromised population.  ? ?  ?

## 2021-06-13 ENCOUNTER — Encounter (HOSPITAL_COMMUNITY): Payer: Self-pay | Admitting: Hematology

## 2021-06-13 MED ORDER — LEVOFLOXACIN 500 MG PO TABS
500.0000 mg | ORAL_TABLET | Freq: Every day | ORAL | 0 refills | Status: DC
Start: 2021-06-13 — End: 2021-06-27

## 2021-06-16 ENCOUNTER — Telehealth (HOSPITAL_COMMUNITY): Payer: Self-pay | Admitting: *Deleted

## 2021-06-16 NOTE — Telephone Encounter (Signed)
Telephone call made to patient to follow up on respiratory symptoms.  States she is breathing much better at this time, just still a bit weak.  Advised her to contact us immediately if symptoms return and we will arrange for a CT of her chest.  If after hours she should report to the emergency room.  Verbalized understanding. ?

## 2021-06-17 DIAGNOSIS — I639 Cerebral infarction, unspecified: Secondary | ICD-10-CM | POA: Diagnosis not present

## 2021-06-17 DIAGNOSIS — I739 Peripheral vascular disease, unspecified: Secondary | ICD-10-CM | POA: Diagnosis not present

## 2021-06-17 DIAGNOSIS — Z87891 Personal history of nicotine dependence: Secondary | ICD-10-CM | POA: Diagnosis not present

## 2021-06-17 DIAGNOSIS — I480 Paroxysmal atrial fibrillation: Secondary | ICD-10-CM | POA: Diagnosis not present

## 2021-06-17 DIAGNOSIS — K219 Gastro-esophageal reflux disease without esophagitis: Secondary | ICD-10-CM | POA: Diagnosis not present

## 2021-06-17 DIAGNOSIS — E785 Hyperlipidemia, unspecified: Secondary | ICD-10-CM | POA: Diagnosis not present

## 2021-06-17 DIAGNOSIS — M1990 Unspecified osteoarthritis, unspecified site: Secondary | ICD-10-CM | POA: Diagnosis not present

## 2021-06-17 DIAGNOSIS — C3491 Malignant neoplasm of unspecified part of right bronchus or lung: Secondary | ICD-10-CM | POA: Diagnosis not present

## 2021-06-17 DIAGNOSIS — E039 Hypothyroidism, unspecified: Secondary | ICD-10-CM | POA: Diagnosis not present

## 2021-06-17 DIAGNOSIS — I10 Essential (primary) hypertension: Secondary | ICD-10-CM | POA: Diagnosis not present

## 2021-06-17 DIAGNOSIS — I2510 Atherosclerotic heart disease of native coronary artery without angina pectoris: Secondary | ICD-10-CM | POA: Diagnosis not present

## 2021-06-17 DIAGNOSIS — I350 Nonrheumatic aortic (valve) stenosis: Secondary | ICD-10-CM | POA: Diagnosis not present

## 2021-06-17 LAB — POCT INR: INR: 3 (ref 2.0–3.0)

## 2021-06-18 ENCOUNTER — Ambulatory Visit (INDEPENDENT_AMBULATORY_CARE_PROVIDER_SITE_OTHER): Payer: Medicare Other | Admitting: Pharmacist

## 2021-06-18 DIAGNOSIS — I4891 Unspecified atrial fibrillation: Secondary | ICD-10-CM

## 2021-06-18 DIAGNOSIS — Z7901 Long term (current) use of anticoagulants: Secondary | ICD-10-CM | POA: Diagnosis not present

## 2021-06-18 NOTE — Progress Notes (Signed)
INR trending up, still on prednisone. Will hold today an then continue current regimen of 2.5mg  daily ?

## 2021-06-18 NOTE — Patient Instructions (Signed)
Description   ?Hold dose tonight and then continue taking warfarin 1 tablet (2.5mg ) daily. Recheck INR in 1 week. Cou[facility] Clinic [phone] ?  ? ? ?

## 2021-06-19 ENCOUNTER — Encounter (HOSPITAL_COMMUNITY): Payer: Self-pay

## 2021-06-19 NOTE — Progress Notes (Signed)
Patient called to report improvement in breathing. Patient also reports increased urination over night last night that has since resolved. No further action needed. ?

## 2021-06-20 ENCOUNTER — Encounter (HOSPITAL_COMMUNITY): Payer: Self-pay

## 2021-06-20 ENCOUNTER — Other Ambulatory Visit: Payer: Self-pay

## 2021-06-20 ENCOUNTER — Encounter (HOSPITAL_COMMUNITY): Payer: Self-pay | Admitting: *Deleted

## 2021-06-20 ENCOUNTER — Emergency Department (HOSPITAL_COMMUNITY): Payer: Medicare Other

## 2021-06-20 ENCOUNTER — Inpatient Hospital Stay (HOSPITAL_COMMUNITY)
Admission: EM | Admit: 2021-06-20 | Discharge: 2021-06-27 | DRG: 392 | Disposition: A | Discharge status: Home Health | Payer: Medicare Other | Attending: Family Medicine | Admitting: Family Medicine

## 2021-06-20 DIAGNOSIS — M1990 Unspecified osteoarthritis, unspecified site: Secondary | ICD-10-CM | POA: Diagnosis present

## 2021-06-20 DIAGNOSIS — E782 Mixed hyperlipidemia: Secondary | ICD-10-CM

## 2021-06-20 DIAGNOSIS — E8809 Other disorders of plasma-protein metabolism, not elsewhere classified: Secondary | ICD-10-CM | POA: Diagnosis not present

## 2021-06-20 DIAGNOSIS — Z902 Acquired absence of lung [part of]: Secondary | ICD-10-CM

## 2021-06-20 DIAGNOSIS — D6959 Other secondary thrombocytopenia: Secondary | ICD-10-CM | POA: Diagnosis not present

## 2021-06-20 DIAGNOSIS — J704 Drug-induced interstitial lung disorders, unspecified: Secondary | ICD-10-CM | POA: Diagnosis present

## 2021-06-20 DIAGNOSIS — R197 Diarrhea, unspecified: Secondary | ICD-10-CM | POA: Diagnosis present

## 2021-06-20 DIAGNOSIS — Z6820 Body mass index (BMI) 20.0-20.9, adult: Secondary | ICD-10-CM | POA: Diagnosis not present

## 2021-06-20 DIAGNOSIS — Z8261 Family history of arthritis: Secondary | ICD-10-CM

## 2021-06-20 DIAGNOSIS — K219 Gastro-esophageal reflux disease without esophagitis: Secondary | ICD-10-CM | POA: Diagnosis present

## 2021-06-20 DIAGNOSIS — R791 Abnormal coagulation profile: Principal | ICD-10-CM

## 2021-06-20 DIAGNOSIS — R1310 Dysphagia, unspecified: Secondary | ICD-10-CM

## 2021-06-20 DIAGNOSIS — I480 Paroxysmal atrial fibrillation: Secondary | ICD-10-CM | POA: Diagnosis not present

## 2021-06-20 DIAGNOSIS — T451X5A Adverse effect of antineoplastic and immunosuppressive drugs, initial encounter: Secondary | ICD-10-CM | POA: Diagnosis present

## 2021-06-20 DIAGNOSIS — I4891 Unspecified atrial fibrillation: Secondary | ICD-10-CM | POA: Diagnosis not present

## 2021-06-20 DIAGNOSIS — Z7952 Long term (current) use of systemic steroids: Secondary | ICD-10-CM

## 2021-06-20 DIAGNOSIS — R1319 Other dysphagia: Secondary | ICD-10-CM | POA: Diagnosis not present

## 2021-06-20 DIAGNOSIS — R1314 Dysphagia, pharyngoesophageal phase: Secondary | ICD-10-CM | POA: Diagnosis present

## 2021-06-20 DIAGNOSIS — R531 Weakness: Secondary | ICD-10-CM

## 2021-06-20 DIAGNOSIS — M479 Spondylosis, unspecified: Secondary | ICD-10-CM | POA: Diagnosis present

## 2021-06-20 DIAGNOSIS — Z887 Allergy status to serum and vaccine status: Secondary | ICD-10-CM

## 2021-06-20 DIAGNOSIS — I129 Hypertensive chronic kidney disease with stage 1 through stage 4 chronic kidney disease, or unspecified chronic kidney disease: Secondary | ICD-10-CM | POA: Diagnosis present

## 2021-06-20 DIAGNOSIS — E440 Moderate protein-calorie malnutrition: Secondary | ICD-10-CM | POA: Diagnosis present

## 2021-06-20 DIAGNOSIS — Z8249 Family history of ischemic heart disease and other diseases of the circulatory system: Secondary | ICD-10-CM

## 2021-06-20 DIAGNOSIS — Z90711 Acquired absence of uterus with remaining cervical stump: Secondary | ICD-10-CM

## 2021-06-20 DIAGNOSIS — Z91040 Latex allergy status: Secondary | ICD-10-CM

## 2021-06-20 DIAGNOSIS — Z9851 Tubal ligation status: Secondary | ICD-10-CM

## 2021-06-20 DIAGNOSIS — J189 Pneumonia, unspecified organism: Secondary | ICD-10-CM | POA: Diagnosis not present

## 2021-06-20 DIAGNOSIS — J440 Chronic obstructive pulmonary disease with acute lower respiratory infection: Secondary | ICD-10-CM | POA: Diagnosis present

## 2021-06-20 DIAGNOSIS — R112 Nausea with vomiting, unspecified: Secondary | ICD-10-CM | POA: Diagnosis not present

## 2021-06-20 DIAGNOSIS — Z90710 Acquired absence of both cervix and uterus: Secondary | ICD-10-CM

## 2021-06-20 DIAGNOSIS — I482 Chronic atrial fibrillation, unspecified: Secondary | ICD-10-CM | POA: Diagnosis present

## 2021-06-20 DIAGNOSIS — Z91048 Other nonmedicinal substance allergy status: Secondary | ICD-10-CM

## 2021-06-20 DIAGNOSIS — I739 Peripheral vascular disease, unspecified: Secondary | ICD-10-CM | POA: Diagnosis present

## 2021-06-20 DIAGNOSIS — B3781 Candidal esophagitis: Secondary | ICD-10-CM | POA: Diagnosis present

## 2021-06-20 DIAGNOSIS — Z8673 Personal history of transient ischemic attack (TIA), and cerebral infarction without residual deficits: Secondary | ICD-10-CM

## 2021-06-20 DIAGNOSIS — Z85828 Personal history of other malignant neoplasm of skin: Secondary | ICD-10-CM

## 2021-06-20 DIAGNOSIS — E039 Hypothyroidism, unspecified: Secondary | ICD-10-CM | POA: Diagnosis present

## 2021-06-20 DIAGNOSIS — Z7901 Long term (current) use of anticoagulants: Secondary | ICD-10-CM

## 2021-06-20 DIAGNOSIS — C3491 Malignant neoplasm of unspecified part of right bronchus or lung: Secondary | ICD-10-CM | POA: Diagnosis not present

## 2021-06-20 DIAGNOSIS — N179 Acute kidney failure, unspecified: Secondary | ICD-10-CM | POA: Diagnosis present

## 2021-06-20 DIAGNOSIS — E860 Dehydration: Secondary | ICD-10-CM | POA: Diagnosis present

## 2021-06-20 DIAGNOSIS — K2100 Gastro-esophageal reflux disease with esophagitis, without bleeding: Secondary | ICD-10-CM | POA: Diagnosis present

## 2021-06-20 DIAGNOSIS — Z87891 Personal history of nicotine dependence: Secondary | ICD-10-CM

## 2021-06-20 DIAGNOSIS — Z823 Family history of stroke: Secondary | ICD-10-CM

## 2021-06-20 DIAGNOSIS — R1111 Vomiting without nausea: Secondary | ICD-10-CM | POA: Diagnosis not present

## 2021-06-20 DIAGNOSIS — R110 Nausea: Secondary | ICD-10-CM | POA: Diagnosis not present

## 2021-06-20 DIAGNOSIS — C7A8 Other malignant neuroendocrine tumors: Secondary | ICD-10-CM | POA: Diagnosis present

## 2021-06-20 DIAGNOSIS — T380X5A Adverse effect of glucocorticoids and synthetic analogues, initial encounter: Secondary | ICD-10-CM | POA: Diagnosis not present

## 2021-06-20 DIAGNOSIS — I2510 Atherosclerotic heart disease of native coronary artery without angina pectoris: Secondary | ICD-10-CM | POA: Diagnosis present

## 2021-06-20 DIAGNOSIS — R000 Tachycardia, unspecified: Secondary | ICD-10-CM | POA: Diagnosis not present

## 2021-06-20 DIAGNOSIS — D72829 Elevated white blood cell count, unspecified: Secondary | ICD-10-CM | POA: Diagnosis present

## 2021-06-20 DIAGNOSIS — Z791 Long term (current) use of non-steroidal anti-inflammatories (NSAID): Secondary | ICD-10-CM

## 2021-06-20 DIAGNOSIS — Z79890 Hormone replacement therapy (postmenopausal): Secondary | ICD-10-CM

## 2021-06-20 DIAGNOSIS — Z79899 Other long term (current) drug therapy: Secondary | ICD-10-CM

## 2021-06-20 DIAGNOSIS — J90 Pleural effusion, not elsewhere classified: Secondary | ICD-10-CM | POA: Diagnosis not present

## 2021-06-20 LAB — URINALYSIS, ROUTINE W REFLEX MICROSCOPIC
Bilirubin Urine: NEGATIVE
Glucose, UA: NEGATIVE mg/dL
Ketones, ur: NEGATIVE mg/dL
Leukocytes,Ua: NEGATIVE
Nitrite: NEGATIVE
Protein, ur: 30 mg/dL — AB
Specific Gravity, Urine: 1.012 (ref 1.005–1.030)
pH: 5 (ref 5.0–8.0)

## 2021-06-20 LAB — COMPREHENSIVE METABOLIC PANEL
ALT: 35 U/L (ref 0–44)
AST: 23 U/L (ref 15–41)
Albumin: 2.3 g/dL — ABNORMAL LOW (ref 3.5–5.0)
Alkaline Phosphatase: 111 U/L (ref 38–126)
Anion gap: 6 (ref 5–15)
BUN: 41 mg/dL — ABNORMAL HIGH (ref 8–23)
CO2: 29 mmol/L (ref 22–32)
Calcium: 8 mg/dL — ABNORMAL LOW (ref 8.9–10.3)
Chloride: 103 mmol/L (ref 98–111)
Creatinine, Ser: 1.52 mg/dL — ABNORMAL HIGH (ref 0.44–1.00)
GFR, Estimated: 35 mL/min — ABNORMAL LOW (ref >60)
Glucose, Bld: 149 mg/dL — ABNORMAL HIGH (ref 70–99)
Potassium: 4.9 mmol/L (ref 3.5–5.1)
Sodium: 138 mmol/L (ref 135–145)
Total Bilirubin: 0.5 mg/dL (ref 0.3–1.2)
Total Protein: 5.5 g/dL — ABNORMAL LOW (ref 6.5–8.1)

## 2021-06-20 LAB — CBC WITH DIFFERENTIAL/PLATELET
Abs Immature Granulocytes: 0.26 10*3/uL — ABNORMAL HIGH (ref 0.00–0.07)
Basophils Absolute: 0 10*3/uL (ref 0.0–0.1)
Basophils Relative: 0 %
Eosinophils Absolute: 0 10*3/uL (ref 0.0–0.5)
Eosinophils Relative: 0 %
HCT: 39.9 % (ref 36.0–46.0)
Hemoglobin: 12.7 g/dL (ref 12.0–15.0)
Immature Granulocytes: 1 %
Lymphocytes Relative: 2 %
Lymphs Abs: 0.4 10*3/uL — ABNORMAL LOW (ref 0.7–4.0)
MCH: 29.1 pg (ref 26.0–34.0)
MCHC: 31.8 g/dL (ref 30.0–36.0)
MCV: 91.3 fL (ref 80.0–100.0)
Monocytes Absolute: 0.5 10*3/uL (ref 0.1–1.0)
Monocytes Relative: 3 %
Neutro Abs: 16.9 10*3/uL — ABNORMAL HIGH (ref 1.7–7.7)
Neutrophils Relative %: 94 %
Platelets: 163 10*3/uL (ref 150–400)
RBC: 4.37 MIL/uL (ref 3.87–5.11)
RDW: 15.1 % (ref 11.5–15.5)
WBC: 18.1 10*3/uL — ABNORMAL HIGH (ref 4.0–10.5)
nRBC: 0 % (ref 0.0–0.2)

## 2021-06-20 LAB — PROTIME-INR
INR: 10.4 (ref 0.8–1.2)
Prothrombin Time: 81.7 seconds — ABNORMAL HIGH (ref 11.4–15.2)

## 2021-06-20 LAB — LIPASE, BLOOD: Lipase: 20 U/L (ref 11–51)

## 2021-06-20 MED ORDER — ONDANSETRON HCL 4 MG/2ML IJ SOLN
4.0000 mg | Freq: Once | INTRAMUSCULAR | Status: AC
  Administered 2021-06-20: 4 mg via INTRAVENOUS
  Filled 2021-06-20: qty 2

## 2021-06-20 MED ORDER — SODIUM CHLORIDE 0.9 % IV SOLN: INTRAVENOUS | Status: AC

## 2021-06-20 MED ORDER — ONDANSETRON HCL 4 MG/2ML IJ SOLN
4.0000 mg | Freq: Four times a day (QID) | INTRAMUSCULAR | Status: DC | PRN
  Administered 2021-06-24: 4 mg via INTRAVENOUS
  Filled 2021-06-20: qty 2

## 2021-06-20 MED ORDER — SODIUM CHLORIDE 0.9 % IV BOLUS
1000.0000 mL | Freq: Once | INTRAVENOUS | Status: AC
  Administered 2021-06-20: 1000 mL via INTRAVENOUS

## 2021-06-20 MED ORDER — ENSURE ENLIVE PO LIQD
237.0000 mL | Freq: Two times a day (BID) | ORAL | Status: DC
  Administered 2021-06-21 – 2021-06-27 (×4): 237 mL via ORAL
  Filled 2021-06-20 (×2): qty 237

## 2021-06-20 NOTE — H&P (Addendum)
?History and Physical  ? ? ?Patient: Katelyn [name] YBO:[id number] DOB: 1940/10/10 ?DOA: 06/20/2021 ?DOS: the patient was seen and examined on 06/20/2021 ?PCP: Celene Squibb, MD  ?Patient coming from: Home ? ?Chief Complaint:  ?Chief Complaint  ?Patient presents with  ? Emesis  ? ?HPI: Katelyn [name] is a 81 y.o. female with medical history significant of atrial fibrillation on warfarin, right upper lobe lung cancer status post right upper lobectomy with lymph node excision (08/15/2020), hypothyroidism, immunotherapy pneumonitis, GERD, hyperlipidemia who presents to the emergency department due to complaint of several days of onset of nausea, vomiting, diarrhea and generalized weakness.  Patient was recently treated for pneumonia/immunotherapy pneumonitis with Levaquin and prednisone, whereby she completed Levaquin yesterday, but still have prednisone.  Patient complained of weakness with difficulty in being able to to get out of bed or chair today without assistance.  She states that she took medication for diarrhea given by Dr. Delton Coombes which appeared to have improved the diarrhea.  Compazine taken for nausea and vomiting showed no improvement in symptoms, she complained of sensation of food/fluid getting stuck in her esophagus.  Vomitus was 6-7 times daily within the last few days, it was nonbloody.  Diarrhea was about 7 episodes daily and was soft blobs with clear-cut edges (type V [place] stool chart).  She denies fever, chills, chest pain, abdominal pain, headache, blurry vision, numbness or tingling. ? ?ED Course:  ?In the emergency department, temperature was 97.53F and other vital signs were within normal range.  Work-up in the ED showed normal CBC except for leukocytosis, BMP showed BUN/creatinine 41/1.52 (baseline creatinine at 1.1-1.4), CBG 149, albumin 2.3, INR 10.4, lipase 20 ?Chest x-ray showed diminished size of RIGHT-sided effusion and associated airspace disease at the RIGHT lung base. ?Zofran was  given and patient was provided with IV hydration.  Hospitalist was asked to admit patient for further evaluation and management. ? ?Review of Systems: ?Review of systems as noted in the HPI. All other systems reviewed and are negative. ? ? ?Past Medical History:  ?Diagnosis Date  ? Allergy   ? rhinitis  ? Aortic stenosis, severe   ? a. s/p TAVR 08/2014.  ? Arthritis   ? spine and various joints  ? Cancer of upper lobe of right lung ([place]) 09/04/2020  ? Carotid artery disease ([place])   ? Cataract   ? Cerebrovascular disease 12/05/2018  ? Childhood asthma   ? Chronic bronchitis ([place])   ?    ? Chronic kidney disease 02/17/2021  ? Claudication [facility])   ? 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 Coronary artery disease   ? a. April 2012 which revealed mid RCA occlusion with collaterals, 50% LAD stenosis, 30% circumflex stenosis, and 40% marginal stenosis  ? First degree AV block   ? GERD (gastroesophageal reflux disease)   ? Heart murmur   ? Hyperlipidemia   ? Hypertension   ? Hypothyroidism 11/14/2020  ? Left bundle branch block   ? LUMBAR RADICULOPATHY, RIGHT   ? PAF (paroxysmal atrial fibrillation) ([place])   ? Peripheral vascular disease ([place])   ? a. s/p aortobifem bypass graft surgery and bilateral fundoplasty by VVS.  ? Renal artery stenosis ([place])   ? a. renal artery stenosis (>60% L renal artery) by duplex 04/2020.  ? 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 26 mm Edwards Sapien 3 transcatheter heart valve placed via open right transfemoral approach  ? Seroma, postoperative   ? Left Groin  ? Stroke [facility])  1982 X 2  ? "a little bit weaker on the left side since" (08/29/2014)  ? 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 a. bilateral subclavian stenosis with antegrade flow by duplex 07/2020  ? ?Past Surgical History:  ?Procedure Laterality Date  ? AORTO-FEMORAL BYPASS GRAFT Bilateral 06/27/10  ? BASAL CELL CARCINOMA EXCISION Left   ? face  ? CARDIAC CATHETERIZATION N/A 07/25/2014  ? Procedure: Right/Left Heart Cath and Coronary  Angiography;  Surgeon: Troy Sine, MD;  Location: [facility];  Service: Cardiovascular;  Laterality: N/A;  ? CATARACT EXTRACTION, BILATERAL Bilateral 09/29/2016  ? COLONOSCOPY  2008  ? Dr. Laural Golden: normal.   ? DILATION AND CURETTAGE OF UTERUS  "2 or 3"  ? ESOPHAGOGASTRODUODENOSCOPY N/A 08/18/2017  ? Dr. Gala Romney: Normal-appearing esophagus status post dilation.  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  Small hiatal hernia.  ? INTERCOSTAL NERVE BLOCK Right 08/15/2020  ? Procedure: INTERCOSTAL NERVE BLOCK RIGHT;  Surgeon: Melrose Nakayama, MD;  Location: [facility];  Service: Thoracic;  Laterality: Right;  ? IR THORACENTESIS ASP PLEURAL SPACE W/IMG GUIDE  10/07/2020  ? MALONEY DILATION N/A 08/18/2017  ? Procedure: MALONEY DILATION;  Surgeon: Daneil Dolin, MD;  [facility]: [facility];  Service: Endoscopy;  Laterality: N/A;  ? MULTIPLE TOOTH EXTRACTIONS    ? NODE DISSECTION Right 08/15/2020  ? Procedure: NODE DISSECTION;  Surgeon: Melrose Nakayama, MD;  Location: [facility];  Service: Thoracic;  Laterality: Right;  ? PORTACATH PLACEMENT Left 01/15/2021  ? Procedure: INSERTION PORT-A-CATH;  Surgeon: Aviva Signs, MD;  Location: [facility];  Service: General;  Laterality: Left;  ? PR VEIN BYPASS GRAFT,AORTO-FEM-POP  06/12/10  ? TEE WITHOUT CARDIOVERSION N/A 08/31/2014  ? Procedure: TRANSESOPHAGEAL ECHOCARDIOGRAM (TEE);  Surgeon: Rexene Alberts, MD;  Location: [facility];  Service: Open Heart Surgery;  Laterality: N/A;  ? TRANSCATHETER AORTIC VALVE REPLACEMENT, TRANSFEMORAL N/A 08/31/2014  ? Procedure: TRANSCATHETER AORTIC VALVE REPLACEMENT, TRANSFEMORAL approach;  Surgeon: Rexene Alberts, MD;  Location: [facility];  Service: Open Heart Surgery;  Laterality: N/A;  ? TUBAL LIGATION  1970's  ? VAGINAL HYSTERECTOMY  1970's  ? Partial   ? ? ?Social History:  reports that she quit smoking about 11 years ago. Her smoking use included cigarettes. She started smoking about 69 years ago. She has a 112.00 pack-year smoking history. She has  never used smokeless tobacco. She reports that she does not drink alcohol and does not use drugs. ? ? ?Allergies  ?Allergen Reactions  ? Adhesive [Tape] Rash  ? Latex Rash  ? Tetanus Toxoids Rash  ? Wound Dressing Adhesive Rash  ? ? ?Family History  ?Problem Relation Age of Onset  ? Heart disease Mother 69  ?     died young  ? Varicose Veins Mother   ? Stroke Father   ? Hypertension Father   ? Heart disease Father 104  ?     Aneurysm  ? Heart attack Father   ? Arthritis Brother   ? Stroke Brother   ? Diabetes Daughter   ? Arthritis Daughter   ? Heart attack Brother   ?     unsure  ? Allergic rhinitis Neg Hx   ? Angioedema Neg Hx   ? Asthma Neg Hx   ? Atopy Neg Hx   ? Eczema Neg Hx   ? Immunodeficiency Neg Hx   ? Urticaria Neg Hx   ?  ? ?Prior to Admission medications   ?Medication Sig Start Date End Date  Taking? Authorizing Provider  ?acetaminophen (TYLENOL) 500 MG tablet Take 500 mg by mouth every 6 (six) hours as needed for moderate pain.   Yes [provider]  ?albuterol (VENTOLIN HFA) 108 (90 Base) MCG/ACT inhaler Inhale 2 puffs into the lungs 2 (two) times daily as needed for wheezing or shortness of breath. 06/12/21  Yes Derek Jack, MD  ?amiodarone (PACERONE) 200 MG tablet Take 1 tablet by mouth once daily ?Patient taking differently: Take 200 mg by mouth daily. 01/20/21  Yes Troy Sine, MD  ?diphenoxylate-atropine (LOMOTIL) 2.5-0.025 MG tablet Take 2 pills at the onset of diarrhea, and then 1 tablet after each episode of diarrhea. ?Patient taking differently: Take 1-2 tablets by mouth See admin instructions. Take 2 pills at the onset of diarrhea, and then 1 tablet after each episode of diarrhea. 12/25/20  Yes Derek Jack, MD  ?estradiol (ESTRACE) 2 MG tablet Take 1 tablet (2 mg total) by mouth daily. 10/23/20  Yes Ailene Ards, NP  ?furosemide (LASIX) 20 MG tablet Take 1 tablet by mouth once daily ?Patient taking differently: Take 20 mg by mouth daily. 04/03/21  Yes Derek Jack, MD  ?ipratropium (ATROVENT) 0.06 % nasal spray USE 1 SPRAY(S) IN EACH NOSTRIL EVERY 6 HOURS AS NEEDED FOR RUNNY NOSE ?Patient taking differently: Place 1 spray into both nostrils every 6 (six) hou

## 2021-06-20 NOTE — Progress Notes (Signed)
Patient's granddaughter called stating that patient has had vomiting and diarrhea x one week.  Patient is very weak and even having trouble standing. Patient recently took a round of Levaquin and is currently finishing a Prednisone taper.  She is taking Compazine as prescribed for nausea, but it makes her "feel funny".  MD made aware and he suggested she come to the ED for evaluation.  Granddaughter informed. ?

## 2021-06-20 NOTE — ED Triage Notes (Signed)
Pt brought in by ems for c/o n/v/d and generalized weakness; pt is a cancer pt and has been taking levaquin for pneumonia and prednisone; pt unable to keep food down; pt denies any pain ?

## 2021-06-20 NOTE — ED Notes (Signed)
Pt made award that a stool sample is needed.  ?

## 2021-06-20 NOTE — ED Notes (Signed)
CRITICAL VALUE STICKER ? ?CRITICAL VALUE: INR 10.4 ? ?RECEIVER (on-site recipient of call):I. Windie Marasco ? ?DATE & TIME NOTIFIED: 06/20/21 ? ?MESSENGER (representative from lab):jamie ? ?MD NOTIFIED: Melina Copa ? ?TIME OF NOTIFICATION:1857 ? ?RESPONSE:   ?

## 2021-06-20 NOTE — ED Provider Notes (Signed)
?[facility] [facility] ?Provider Note ? ? ?CSN: [id number] ?Arrival date & time: 06/20/21  1357 ? ?  ? ?History ? ?Chief Complaint  ?Patient presents with  ? Emesis  ? ? ?Katelyn [name] is a 81 y.o. 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  She was recently treated with Levaquin and prednisone for a pneumonia/pneumonitis, she completed her Levaquin yesterday and is currently on 20 mg prednisone tablets.  She reports her shortness of breath has greatly improved with this treatment.   She has had significant weakness, daughter at the bedside stated she was unable to get out of bed or even out of a chair today without assistance and is concerned about her weakness and deconditioning.  Patient states she took 2 tablets of an OTC diarrhea medicine, name unknown this morning and her diarrhea has improved.  She is also taking Compazine with no improvement in the nausea and vomiting.  She has been unable to tolerate p.o. intake since yesterday.  She has had no diarrhea since arriving here. ? ?The history is provided by the patient and a relative.  ? ?  ? ?Home Medications ?Prior to Admission medications   ?Medication Sig Start Date End Date Taking? Authorizing Provider  ?acetaminophen (TYLENOL) 500 MG tablet Take 500 mg by mouth every 6 (six) hours as needed for moderate pain.   Yes [provider]  ?albuterol (VENTOLIN HFA) 108 (90 Base) MCG/ACT inhaler Inhale 2 puffs into the lungs 2 (two) times daily as needed for wheezing or shortness of breath. 06/12/21  Yes Derek Jack,  MD  ?amiodarone (PACERONE) 200 MG tablet Take 1 tablet by mouth once daily ?Patient taking differently: Take 200 mg by mouth daily. 01/20/21  Yes Troy Sine, MD  ?diphenoxylate-atropine (LOMOTIL) 2.5-0.025 MG tablet Take 2 pills at the onset of diarrhea, and then 1 tablet after each episode of diarrhea. ?Patient taking differently: Take 1-2 tablets by mouth See admin instructions. Take 2 pills at the onset of diarrhea, and then 1 tablet after each episode of diarrhea. 12/25/20  Yes Derek Jack, MD  ?estradiol (ESTRACE) 2 MG tablet Take 1 tablet (2 mg total) by mouth daily. 10/23/20  Yes Ailene Ards, NP  ?furosemide (LASIX) 20 MG tablet Take 1 tablet by mouth once daily ?Patient taking differently: Take 20 mg by mouth daily. 04/03/21  Yes Derek Jack, MD  ?ipratropium (ATROVENT) 0.06 % nasal spray USE 1 SPRAY(S) IN EACH NOSTRIL EVERY 6 HOURS AS NEEDED FOR RUNNY NOSE ?Patient taking differently: Place 1 spray into both nostrils every 6 (six) hours as needed for rhinitis. 10/22/20  Yes Ailene Ards, NP  ?irbesartan (AVAPRO) 300 MG tablet Take 1 tablet by mouth once daily ?Patient taking differently: Take 300 mg by mouth daily. 05/05/21  Yes Troy Sine, MD  ?levothyroxine (SYNTHROID) 50 MCG tablet Take 1 tablet (50 mcg total) by mouth daily before breakfast. 04/22/21  Yes Derek Jack, MD  ?lidocaine-prilocaine (EMLA) cream Apply a small amount to port a cath site and cover with plastic wrap 1 hour prior to infusion appointments 12/16/20  Yes Derek Jack, MD  ?meloxicam [facility]) 15  MG tablet Take 1 tablet by mouth once daily ?Patient taking differently: Take 15 mg by mouth daily as needed for pain. 05/20/20  Yes Gosrani, Nimish C, MD  ?metoCLOPramide (REGLAN) 5 MG tablet Take 2 tablets (10 mg total) by mouth 3 (three) times daily before meals. 11/13/20  Yes Derek Jack, MD  ?metoprolol succinate (TOPROL-XL) 25 MG 24 hr tablet Take 1 tablet by mouth once daily ?Patient  taking differently: Take 25 mg by mouth daily. 04/18/21  Yes Troy Sine, MD  ?nitroGLYCERIN (NITROSTAT) 0.4 MG SL tablet Place 0.4 mg under the tongue every 5 (five) minutes as needed for chest pain. 04/04/20  Yes [provider]  ?OVER THE COUNTER MEDICATION Apply 1 application topically every other day. Lantispetic cream   Yes [provider]  ?oxyCODONE (OXY IR/ROXICODONE) 5 MG immediate release tablet Take 1 tablet (5 mg total) by mouth at bedtime as needed for severe pain. 01/07/21  Yes Derek Jack, MD  ?pantoprazole (PROTONIX) 40 MG tablet TAKE 1 TABLET BY MOUTH ONCE DAILY BEFORE SUPPER ?Patient taking differently: Take 40 mg by mouth daily. 07/16/20  Yes Erenest Rasher, PA-C  ?potassium chloride (KLOR-CON) 10 MEQ tablet Take 1 tablet (10 mEq total) by mouth daily. 11/05/20  Yes Melrose Nakayama, MD  ?predniSONE (DELTASONE) 20 MG tablet Take 1 tablet (20 mg total) by mouth daily with breakfast. 06/12/21  Yes Derek Jack, MD  ?prochlorperazine (COMPAZINE) 10 MG tablet Take 1 tablet (10 mg total) by mouth every 6 (six) hours as needed (Nausea or vomiting). 12/16/20  Yes Derek Jack, MD  ?rosuvastatin (CRESTOR) 20 MG tablet Take 1 tablet (20 mg total) by mouth at bedtime. 10/23/20  Yes Ailene Ards, NP  ?warfarin (COUMADIN) 2.5 MG tablet Take 1 tablet (2.5 mg total) by mouth daily. ?Patient taking differently: Take 2.5 mg by mouth at bedtime. 10/23/20  Yes Ailene Ards, NP  ?diphenhydrAMINE (BENADRYL) 25 MG tablet Take 25 mg by mouth every 6 (six) hours as needed for allergies. ?Patient not taking: Reported on 06/20/2021    [provider]  ?levofloxacin (LEVAQUIN) 500 MG tablet Take 1 tablet (500 mg total) by mouth daily. ?Patient not taking: Reported on 06/20/2021 06/13/21   Derek Jack, MD  ?Pembrolizumab [facility] IV) Inject into the vein every 21 ( twenty-one) days. ?Patient not taking: Reported on 06/20/2021    [provider]  ?    ? ?Allergies    ?Adhesive [tape], Latex, Tetanus toxoids, and Wound dressing adhesive   ? ?Review of Systems   ?Review of Systems  ?Constitutional:  Positive for fatigue. Negative for fever.  ?HENT:  Negative for congestion and sore throat.   ?Eyes: Negative.   ?Respiratory:  Negative for chest tightness and shortness of breath.   ?Cardiovascular:  Negative for chest pain.  ?Gastrointestinal:  Positive for diarrhea, nausea and vomiting. Negative for abdominal pain.  ?Genitourinary: Negative.   ?Musculoskeletal:  Negative for arthralgias, joint swelling and neck pain.  ?Skin: Negative.  Negative for rash and wound.  ?Neurological:  Positive for weakness. Negative for dizziness, light-headedness, numbness and headaches.  ?Hematological:  Does not bruise/bleed easily.  ?Psychiatric/Behavioral: Negative.    ?All other systems reviewed and are negative. ? ?Physical Exam ?Updated Vital Signs ?BP 124/82   Pulse 87   Temp (!) 97.5 ?F (36.4 ?C) (Oral)   Resp 13   Ht 5\' 10"  (1.778 m)   Wt 64 kg   SpO2 96%   BMI 20.23 kg/m?  ?Physical  Exam ?Vitals and nursing note reviewed.  ?Constitutional:   ?   Appearance: She is well-developed.  ?HENT:  ?   Head: Normocephalic and atraumatic.  ?   Mouth/Throat:  ?   Mouth: Mucous membranes are dry.  ?Eyes:  ?   Conjunctiva/sclera: Conjunctivae normal.  ?Cardiovascular:  ?   Rate and Rhythm: Normal rate and regular rhythm.  ?   Heart sounds: Normal heart sounds.  ?Pulmonary:  ?   Effort: Pulmonary effort is normal.  ?   Breath sounds: Normal breath sounds. No wheezing.  ?Abdominal:  ?   General: Bowel sounds are normal. There is no distension.  ?   Palpations: Abdomen is soft.  ?   Tenderness: There is no abdominal tenderness. There is no guarding.  ?Musculoskeletal:     ?   General: Normal range of motion.  ?   Cervical back: Normal range of motion.  ?Skin: ?   General: Skin is warm and dry.  ?Neurological:  ?   General: No focal deficit present.  ?   Mental Status: She is alert.   ? ? ?ED Results / Procedures / Treatments   ?Labs ?(all labs ordered are listed, but only abnormal results are displayed) ?Labs Reviewed  ?CBC WITH DIFFERENTIAL/PLATELET - Abnormal; Notable for the following compone

## 2021-06-21 DIAGNOSIS — R791 Abnormal coagulation profile: Secondary | ICD-10-CM | POA: Diagnosis present

## 2021-06-21 DIAGNOSIS — E039 Hypothyroidism, unspecified: Secondary | ICD-10-CM | POA: Diagnosis present

## 2021-06-21 DIAGNOSIS — I480 Paroxysmal atrial fibrillation: Secondary | ICD-10-CM | POA: Diagnosis present

## 2021-06-21 DIAGNOSIS — R1310 Dysphagia, unspecified: Secondary | ICD-10-CM | POA: Diagnosis not present

## 2021-06-21 DIAGNOSIS — M1990 Unspecified osteoarthritis, unspecified site: Secondary | ICD-10-CM | POA: Diagnosis present

## 2021-06-21 DIAGNOSIS — C7A8 Other malignant neuroendocrine tumors: Secondary | ICD-10-CM | POA: Diagnosis present

## 2021-06-21 DIAGNOSIS — R1319 Other dysphagia: Secondary | ICD-10-CM | POA: Diagnosis not present

## 2021-06-21 DIAGNOSIS — J704 Drug-induced interstitial lung disorders, unspecified: Secondary | ICD-10-CM | POA: Diagnosis present

## 2021-06-21 DIAGNOSIS — I482 Chronic atrial fibrillation, unspecified: Secondary | ICD-10-CM | POA: Diagnosis present

## 2021-06-21 DIAGNOSIS — I739 Peripheral vascular disease, unspecified: Secondary | ICD-10-CM | POA: Diagnosis present

## 2021-06-21 DIAGNOSIS — R197 Diarrhea, unspecified: Secondary | ICD-10-CM | POA: Diagnosis present

## 2021-06-21 DIAGNOSIS — R112 Nausea with vomiting, unspecified: Secondary | ICD-10-CM | POA: Diagnosis not present

## 2021-06-21 DIAGNOSIS — J440 Chronic obstructive pulmonary disease with acute lower respiratory infection: Secondary | ICD-10-CM | POA: Diagnosis present

## 2021-06-21 DIAGNOSIS — M479 Spondylosis, unspecified: Secondary | ICD-10-CM | POA: Diagnosis present

## 2021-06-21 DIAGNOSIS — I129 Hypertensive chronic kidney disease with stage 1 through stage 4 chronic kidney disease, or unspecified chronic kidney disease: Secondary | ICD-10-CM | POA: Diagnosis present

## 2021-06-21 DIAGNOSIS — D72829 Elevated white blood cell count, unspecified: Secondary | ICD-10-CM | POA: Diagnosis present

## 2021-06-21 DIAGNOSIS — N179 Acute kidney failure, unspecified: Secondary | ICD-10-CM | POA: Diagnosis present

## 2021-06-21 DIAGNOSIS — E440 Moderate protein-calorie malnutrition: Secondary | ICD-10-CM | POA: Diagnosis present

## 2021-06-21 DIAGNOSIS — E860 Dehydration: Secondary | ICD-10-CM | POA: Diagnosis present

## 2021-06-21 DIAGNOSIS — Z6820 Body mass index (BMI) 20.0-20.9, adult: Secondary | ICD-10-CM | POA: Diagnosis not present

## 2021-06-21 DIAGNOSIS — R531 Weakness: Secondary | ICD-10-CM | POA: Diagnosis not present

## 2021-06-21 DIAGNOSIS — D6959 Other secondary thrombocytopenia: Secondary | ICD-10-CM | POA: Diagnosis not present

## 2021-06-21 DIAGNOSIS — E782 Mixed hyperlipidemia: Secondary | ICD-10-CM | POA: Diagnosis present

## 2021-06-21 DIAGNOSIS — R1314 Dysphagia, pharyngoesophageal phase: Secondary | ICD-10-CM | POA: Diagnosis present

## 2021-06-21 DIAGNOSIS — K219 Gastro-esophageal reflux disease without esophagitis: Secondary | ICD-10-CM | POA: Diagnosis not present

## 2021-06-21 DIAGNOSIS — I2510 Atherosclerotic heart disease of native coronary artery without angina pectoris: Secondary | ICD-10-CM | POA: Diagnosis present

## 2021-06-21 DIAGNOSIS — K2100 Gastro-esophageal reflux disease with esophagitis, without bleeding: Secondary | ICD-10-CM | POA: Diagnosis present

## 2021-06-21 DIAGNOSIS — C3491 Malignant neoplasm of unspecified part of right bronchus or lung: Secondary | ICD-10-CM | POA: Diagnosis not present

## 2021-06-21 DIAGNOSIS — B3781 Candidal esophagitis: Secondary | ICD-10-CM | POA: Diagnosis present

## 2021-06-21 LAB — PROTIME-INR
INR: 8.5 (ref 0.8–1.2)
Prothrombin Time: 69.7 seconds — ABNORMAL HIGH (ref 11.4–15.2)

## 2021-06-21 LAB — COMPREHENSIVE METABOLIC PANEL
ALT: 30 U/L (ref 0–44)
AST: 18 U/L (ref 15–41)
Albumin: 2.2 g/dL — ABNORMAL LOW (ref 3.5–5.0)
Alkaline Phosphatase: 92 U/L (ref 38–126)
Anion gap: 5 (ref 5–15)
BUN: 35 mg/dL — ABNORMAL HIGH (ref 8–23)
CO2: 29 mmol/L (ref 22–32)
Calcium: 7.8 mg/dL — ABNORMAL LOW (ref 8.9–10.3)
Chloride: 105 mmol/L (ref 98–111)
Creatinine, Ser: 1.39 mg/dL — ABNORMAL HIGH (ref 0.44–1.00)
GFR, Estimated: 39 mL/min — ABNORMAL LOW (ref >60)
Glucose, Bld: 78 mg/dL (ref 70–99)
Potassium: 4.1 mmol/L (ref 3.5–5.1)
Sodium: 139 mmol/L (ref 135–145)
Total Bilirubin: 0.5 mg/dL (ref 0.3–1.2)
Total Protein: 5.1 g/dL — ABNORMAL LOW (ref 6.5–8.1)

## 2021-06-21 LAB — CBC
HCT: 40.4 % (ref 36.0–46.0)
Hemoglobin: 12.4 g/dL (ref 12.0–15.0)
MCH: 28.4 pg (ref 26.0–34.0)
MCHC: 30.7 g/dL (ref 30.0–36.0)
MCV: 92.4 fL (ref 80.0–100.0)
Platelets: 150 10*3/uL (ref 150–400)
RBC: 4.37 MIL/uL (ref 3.87–5.11)
RDW: 15.1 % (ref 11.5–15.5)
WBC: 11 10*3/uL — ABNORMAL HIGH (ref 4.0–10.5)
nRBC: 0 % (ref 0.0–0.2)

## 2021-06-21 LAB — MAGNESIUM: Magnesium: 1.8 mg/dL (ref 1.7–2.4)

## 2021-06-21 LAB — PHOSPHORUS: Phosphorus: 3.3 mg/dL (ref 2.5–4.6)

## 2021-06-21 MED ORDER — LABETALOL HCL 5 MG/ML IV SOLN: 10.0000 mg | INTRAVENOUS | Status: DC | PRN

## 2021-06-21 MED ORDER — PHYTONADIONE 5 MG PO TABS
5.0000 mg | ORAL_TABLET | Freq: Once | ORAL | Status: AC
  Administered 2021-06-21: 5 mg via ORAL
  Filled 2021-06-21: qty 1

## 2021-06-21 MED ORDER — NYSTATIN 100000 UNIT/ML MT SUSP
5.0000 mL | Freq: Four times a day (QID) | OROMUCOSAL | Status: DC
  Administered 2021-06-21 – 2021-06-27 (×22): 500000 [IU] via ORAL
  Filled 2021-06-21 (×23): qty 5

## 2021-06-21 MED ORDER — METOPROLOL SUCCINATE ER 25 MG PO TB24
25.0000 mg | ORAL_TABLET | Freq: Every day | ORAL | Status: DC
  Administered 2021-06-21 – 2021-06-27 (×6): 25 mg via ORAL
  Filled 2021-06-21 (×7): qty 1

## 2021-06-21 MED ORDER — AMIODARONE HCL 200 MG PO TABS
200.0000 mg | ORAL_TABLET | Freq: Every day | ORAL | Status: DC
  Administered 2021-06-21 – 2021-06-27 (×6): 200 mg via ORAL
  Filled 2021-06-21 (×6): qty 1

## 2021-06-21 MED ORDER — SODIUM CHLORIDE 0.9 % IV SOLN: INTRAVENOUS | Status: AC

## 2021-06-21 MED ORDER — PANTOPRAZOLE SODIUM 40 MG PO TBEC
40.0000 mg | DELAYED_RELEASE_TABLET | Freq: Two times a day (BID) | ORAL | Status: DC
  Administered 2021-06-21 – 2021-06-27 (×11): 40 mg via ORAL
  Filled 2021-06-21 (×12): qty 1

## 2021-06-21 NOTE — Progress Notes (Signed)
Lab reports Critical INR 8.5. MD informed. ?

## 2021-06-21 NOTE — Progress Notes (Signed)
?PROGRESS NOTE ? ? ? ? ?Katelyn [name], is a 81 y.o. female, DOB - 1940-09-21, HER:[id number] ? ?Admit date - 06/20/2021   Admitting Physician Brylen Wagar Denton Brick, MD ? ?Outpatient Primary MD for the patient is Celene Squibb, MD ? ?LOS - 0 ? ?Chief Complaint  ?Patient presents with  ? Emesis  ?    ? ? ?Brief Narrative:  ?81 y.o. female with medical history significant of atrial fibrillation on warfarin, right upper lobe lung cancer status post right upper lobectomy with lymph node excision (08/15/2020), hypothyroidism, immunotherapy pneumonitis, GERD, HLD admitted on 06/20/2020 with dysphagia and intractable emesis in the setting of supratherapeutic INR ? ?  ?-Assessment and Plan: ?1) esophageal dysphagia with intractable emesis--GI consult appreciated ?-Try liquid diet ?-Nystatin as ordered by GI team ?-If symptoms persist consider EGD with dilatation on 06/23/2021 once INR comes down below 1.6 ? ?2) diarrhea and dehydration--stool studies are pending ?-IV fluids until oral intake is more reliable ?-may consider as needed Imodium if C. difficile is negative ? ?3) chronic atrial fibrillation--- continue amiodarone and Toprol-XL for rate control ?-INR supratherapeutic currently down to 8.5 from above 10 ?-Supratherapeutic INR is due to recent Levaquin use with cytochrome P450 interaction in the setting of poor oral intake with limited vitamin K ingestion ?-Redosed with vitamin K ?-Hold Coumadin for now ? 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 Delton Coombes, she continues to be on chemotherapy with Keytruda ?  ?5) hypothyroidism--- continue Synthroid ? ?6)HLD--continue Crestor ? ?7)Immunotherapy pneumonitis ?Patient was started on Levaquin and prednisone, she already completed Levaquin ?Continue prednisone ? ?8) leukocytosis--- may be related to steroid therapy ?-Patient does  have vomiting or diarrhea stool studies are pending ? ?Disposition/Need for in-Hospital Stay- patient  unable to be discharged at this time due to --dysphagia and intractable emesis requiring IV fluids and possible EGD with dilatation --unable to discharge pending tolerance of oral intake* ? ?Status is: Inpatient  ? ?Disposition: The patient is from: Home ?             Anticipated d/c is to: Home ?             Anticipated d/c date is: 2 days ?             Patient currently is not medically stable to d/c. ?Barriers: Not Clinically Stable-  ? ?Code Status : -  Code Status: Full Code  ? ?Family Communication:   NA (patient is alert, awake and coherent)  ? ?DVT Prophylaxis  :   - SCDs   SCDs Start: 06/20/21 2036 ? ? ?Lab Results  ?Component Value Date  ? PLT 150 06/21/2021  ? ? ?Inpatient Medications ? ?Scheduled Meds: ? feeding supplement  237 mL Oral BID BM  ? nystatin  5 mL Oral QID  ? ?Continuous Infusions: ? sodium chloride 50 mL/hr at 06/21/21 1715  ? ?PRN Meds:.ondansetron (ZOFRAN) IV ? ? ?Anti-infectives (From admission, onward)  ? ? None  ? ?  ? ?  ? ?Subjective: ?Katelyn [name] today has  No chest pain,   ?No fever  Or chills  ?-No further emesis today ?-Trying to take liquids ?-No further diarrhea today ? ? ?Objective: ?Vitals:  ? 06/20/21 2230 06/20/21 2300 06/21/21 0445 06/21/21 1300  ?BP: 121/84 133/76 125/71 110/61  ?Pulse: 84 83 86 85  ?Resp: 20 18 18 18   ?Temp:  97.7 ?F (36.5 ?C) 97.7 ?F (36.5 ?C) 97.8 ?F (36.6 ?C)  ?TempSrc:  Oral Oral Oral  ?SpO2: 99% 95% 97% 96%  ?Weight:  64.6 kg    ?Height:  5\' 10"  (1.778 m)    ? ? ?Intake/Output Summary (Last 24 hours) at 06/21/2021 1844 ?Last data filed at 06/21/2021 1715 ?Gross per 24 hour  ?Intake 2041.9 ml  ?Output --  ?Net 2041.9 ml  ? ?Filed Weights  ? 06/20/21 1412 06/20/21 2300  ?Weight: 64 kg 64.6 kg  ? ? ?Physical Exam ? ?Gen:- Awake Alert,  in no apparent distress  ?HEENT:- [place].AT, No sclera icterus ?Neck-Supple Neck,No JVD,.  ?Lungs-  CTAB , fair symmetrical air movement ?CV- S1, S2 normal, regular  ?Abd-  +ve B.Sounds, Abd Soft, No tenderness,     ?Extremity/Skin:- No  edema, pedal pulses present  ?Psych-affect is appropriate, oriented x3 ?Neuro-generalized weakness, no new focal deficits, no tremors ? ?Data Reviewed: I have personally reviewed following labs and imaging studies ? ?CBC: ?Recent Labs  ?Lab 06/20/21 ?1535 06/21/21 ?5035  ?WBC 18.1* 11.0*  ?NEUTROABS 16.9*  --   ?HGB 12.7 12.4  ?HCT 39.9 40.4  ?MCV 91.3 92.4  ?PLT 163 150  ? ?Basic Metabolic Panel: ?Recent Labs  ?Lab 06/20/21 ?1535 06/21/21 ?4656  ?NA 138 139  ?K 4.9 4.1  ?CL 103 105  ?CO2 29 29  ?GLUCOSE 149* 78  ?BUN 41* 35*  ?CREATININE 1.52* 1.39*  ?CALCIUM 8.0* 7.8*  ?MG  --  1.8  ?PHOS  --  3.3  ? ?GFR: ?Estimated Creatinine Clearance: 34 mL/min (A) (by C-G formula based on SCr of 1.39 mg/dL (H)). ?Liver Function Tests: ?Recent Labs  ?Lab 06/20/21 ?1535 06/21/21 ?8127  ?AST 23 18  ?ALT 35 30  ?ALKPHOS 111 92  ?BILITOT 0.5 0.5  ?PROT 5.5* 5.1*  ?ALBUMIN 2.3* 2.2*  ? ?Cardiac Enzymes: ?No results for input(s): CKTOTAL, CKMB, CKMBINDEX, TROPONINI in the last 168 hours. ?BNP (last 3 results) ?No results for input(s): PROBNP in the last 8760 hours. ?HbA1C: ?No results for input(s): HGBA1C in the last 72 hours. ?Sepsis Labs: ?@LABRCNTIP (procalcitonin:4,lacticidven:4) ?)No results found for this or any previous visit (from the past 240 hour(s)).  ? ? ?Radiology Studies: ?DG Chest Portable 1 View ? ?Result Date: 06/20/2021 ?CLINICAL DATA:  Recent pneumonia in a 81 year old female. EXAM: PORTABLE CHEST 1 VIEW COMPARISON:  June 12, 2021. FINDINGS: LEFT Port-A-Cath terminates at the distal aspect of the superior vena cava as before. Signs of TAVR and aortic atherosclerosis with stable cardiomediastinal contours. Moderate to large RIGHT-sided effusion is diminished in size. Postoperative changes with suture line in the RIGHT upper lobe. Associated airspace disease at the RIGHT lung base is improved though still with considerable opacification and obscured RIGHT hemidiaphragm. The LEFT chest is clear.  On limited assessment there is no acute skeletal finding with EKG leads projecting over the patient's chest. IMPRESSION: 1. Diminished size of RIGHT-sided effusion and associated airspace disease at the RIGHT lung base. 2. No new abnormalities. Electronically Signed   By: Zetta Bills M.D.   On: 06/20/2021 15:57   ? ?inScheduled Meds: ? feeding supplement  237 mL Oral BID BM  ? nystatin  5 mL Oral QID  ? ?Continuous Infusions: ? sodium chloride 50 mL/hr at 06/21/21 1715  ? ? ? LOS: 0 days  ? ?Roxan Hockey M.D on 06/21/2021 at 6:44 PM ? ?Go to [email or web address] - for contact info ? ?[facility] - Office  [phone] ? ?If 7PM-7AM, please contact night-coverage [email or web address] ?Password [email or web address] ?06/21/2021, 6:44 PM  ? ? ?

## 2021-06-21 NOTE — Consult Note (Signed)
Referring Provider: Roxan Hockey, MD ?Primary Care Physician:  Celene Squibb, MD ?Primary Gastroenterologist:  Dr. Laural Golden ? ?Reason for Consultation:   ? ?Esophageal dysphagia. ? ?HPI:  ? ?Patient is 81 year old Caucasian female history of large cell carcinoma of right lung status post right upper lobectomy in June 2022, history of atrial fibrillation on warfarin history of remote CVA hypothyroidism, and history of pneumonitis secondary to immunotherapy who completed Levaquin 1 week course of Levaquin 2 days ago and is back on prednisone who presented to emergency room yesterday afternoon with nausea vomiting diarrhea and weakness. ?Lab studies revealed leukocytosis with WBC of 18.1 H&H of 12.7 and 39.9 and platelet count of 163 K.  LFTs were normal except total protein of 5.5 and albumin of 2.3.  Metabolic 7 pertinent for normal electrolytes but glucose of 149 BUN of 41 and creatinine of 1.52.  Calcium was 8.0.  Serum lipase was 20 chest film revealed decrease in right-sided effusion and no new abnormalities.  INR was 10.4. ?Patient was admitted to hospitalist service and begun on IV fluids.  She was also given oral vitamin K.  Patient has not had any diarrhea since hospitalization.  She states she took 2 doses of Lomotil and diarrhea stopped.  She also has not experienced any nausea and vomiting today.  She is tolerating full liquids. ?Stool studies are pending.  INR is down to 8.5.  WBC is 11.0. ?Patient has chronic GERD.  She says heartburn is well controlled with therapy.  She also has history of esophageal dysphagia.  She underwent esophagogastroduodenoscopy by Dr. Gala Romney in June 2019.  No structural abnormality was noted to her esophagus but she did respond to esophageal dilation.  She is states she did great for 6 months and since then she has had sporadic episodes of dysphagia which has worsened this week.  She has difficulty with solids.  She points to lower sternal area as site of bolus obstruction.   Bolus eventually passes down.  She is not having any pain or odynophagia.  She denies abdominal pain. ?Her appetite is fair.  She is not sure if she has lost any weight but since she was diagnosed with lung carcinoma she has lost more than 15 pounds.  She has been receiving carboplatin, Taxol and Keytruda every 3 weeks.  She tells me that Dr. Delton Coombes has stopped carboplatin and Taxol because of pneumonitis. ? ?Past Medical History:  ?Diagnosis Date  ? Allergy   ? rhinitis  ? Aortic stenosis, severe   ? a. s/p TAVR 08/2014.  ? Arthritis   ? spine and various joints  ? Cancer of upper lobe of right lung ([place]) 09/04/2020  ? Carotid artery disease ([place])   ? Cataract   ? Cerebrovascular disease 12/05/2018  ? Childhood asthma   ? Chronic bronchitis ([place])   ?    ? Chronic kidney disease 02/17/2021  ? Claudication [facility])   ? 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 Coronary artery disease   ? a. April 2012 which revealed mid RCA occlusion with collaterals, 50% LAD stenosis, 30% circumflex stenosis, and 40% marginal stenosis  ? First degree AV block   ? GERD (gastroesophageal reflux disease)   ? Heart murmur   ? Hyperlipidemia   ? Hypertension   ? Hypothyroidism 11/14/2020  ? Left bundle branch block   ? LUMBAR RADICULOPATHY, RIGHT   ? PAF (paroxysmal atrial fibrillation) ([place])   ? Peripheral vascular disease ([place])   ? a. s/p aortobifem bypass graft  surgery and bilateral fundoplasty by [facility].  ? Renal artery stenosis ([place])   ? a. renal artery stenosis (>60% L renal artery) by duplex 04/2020.  ? 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 26 mm Edwards Sapien 3 transcatheter heart valve placed via open right transfemoral approach  ? Seroma, postoperative   ? Left Groin  ? Stroke [facility]) 1982 X 2  ? "a little bit weaker on the left side since" (08/29/2014)  ? 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 a. bilateral subclavian stenosis with antegrade flow by duplex 07/2020  ? ? ?Past Surgical History:  ?Procedure Laterality Date  ?  AORTO-FEMORAL BYPASS GRAFT Bilateral 06/27/10  ? BASAL CELL CARCINOMA EXCISION Left   ? face  ? CARDIAC CATHETERIZATION N/A 07/25/2014  ? Procedure: Right/Left Heart Cath and Coronary Angiography;  Surgeon: Troy Sine, MD;  Location: [facility];  Service: Cardiovascular;  Laterality: N/A;  ? CATARACT EXTRACTION, BILATERAL Bilateral 09/29/2016  ? COLONOSCOPY  2008  ? Dr. Laural Golden: normal.   ? DILATION AND CURETTAGE OF UTERUS  "2 or 3"  ? ESOPHAGOGASTRODUODENOSCOPY N/A 08/18/2017  ? Dr. Gala Romney: Normal-appearing esophagus status post dilation.  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  Small hiatal hernia.  ? INTERCOSTAL NERVE BLOCK Right 08/15/2020  ? Procedure: INTERCOSTAL NERVE BLOCK RIGHT;  Surgeon: Melrose Nakayama, MD;  Location: [facility];  Service: Thoracic;  Laterality: Right;  ? IR THORACENTESIS ASP PLEURAL SPACE W/IMG GUIDE  10/07/2020  ? MALONEY DILATION N/A 08/18/2017  ? Procedure: MALONEY DILATION;  Surgeon: Daneil Dolin, MD;  [facility];  Service: Endoscopy;  Laterality: N/A;  ? MULTIPLE TOOTH EXTRACTIONS    ? NODE DISSECTION Right 08/15/2020  ? Procedure: NODE DISSECTION;  Surgeon: Melrose Nakayama, MD;  Location: [facility];  Service: Thoracic;  Laterality: Right;  ? PORTACATH PLACEMENT Left 01/15/2021  ? Procedure: INSERTION PORT-A-CATH;  Surgeon: Aviva Signs, MD;  Location: [facility];  Service: General;  Laterality: Left;  ? PR VEIN BYPASS GRAFT,AORTO-FEM-POP  06/12/10  ? TEE WITHOUT CARDIOVERSION N/A 08/31/2014  ? Procedure: TRANSESOPHAGEAL ECHOCARDIOGRAM (TEE);  Surgeon: Rexene Alberts, MD;  Location: [facility];  Service: Open Heart Surgery;  Laterality: N/A;  ? TRANSCATHETER AORTIC VALVE REPLACEMENT, TRANSFEMORAL N/A 08/31/2014  ? Procedure: TRANSCATHETER AORTIC VALVE REPLACEMENT, TRANSFEMORAL approach;  Surgeon: Rexene Alberts, MD;  Location: [place] [facility];  Service: Open Heart Surgery;  Laterality: N/A;  ? TUBAL LIGATION  1970's  ? VAGINAL HYSTERECTOMY  1970's  ? Partial   ? ? ?Prior to  Admission medications   ?Medication Sig Start Date End Date Taking? Authorizing Provider  ?acetaminophen (TYLENOL) 500 MG tablet Take 500 mg by mouth every 6 (six) hours as needed for moderate pain.   Yes [provider]  ?albuterol (VENTOLIN HFA) 108 (90 Base) MCG/ACT inhaler Inhale 2 puffs into the lungs 2 (two) times daily as needed for wheezing or shortness of breath. 06/12/21  Yes Derek Jack, MD  ?amiodarone (PACERONE) 200 MG tablet Take 1 tablet by mouth once daily ?Patient taking differently: Take 200 mg by mouth daily. 01/20/21  Yes Troy Sine, MD  ?diphenoxylate-atropine (LOMOTIL) 2.5-0.025 MG tablet Take 2 pills at the onset of diarrhea, and then 1 tablet after each episode of diarrhea. ?Patient taking differently: Take 1-2 tablets by mouth See admin instructions. Take 2 pills at the onset of diarrhea, and then 1 tablet after each episode of diarrhea. 12/25/20  Yes Derek Jack, MD  ?estradiol (ESTRACE)  2 MG tablet Take 1 tablet (2 mg total) by mouth daily. 10/23/20  Yes Ailene Ards, NP  ?furosemide (LASIX) 20 MG tablet Take 1 tablet by mouth once daily ?Patient taking differently: Take 20 mg by mouth daily. 04/03/21  Yes Derek Jack, MD  ?ipratropium (ATROVENT) 0.06 % nasal spray USE 1 SPRAY(S) IN EACH NOSTRIL EVERY 6 HOURS AS NEEDED FOR RUNNY NOSE ?Patient taking differently: Place 1 spray into both nostrils every 6 (six) hours as needed for rhinitis. 10/22/20  Yes Ailene Ards, NP  ?irbesartan (AVAPRO) 300 MG tablet Take 1 tablet by mouth once daily ?Patient taking differently: Take 300 mg by mouth daily. 05/05/21  Yes Troy Sine, MD  ?levothyroxine (SYNTHROID) 50 MCG tablet Take 1 tablet (50 mcg total) by mouth daily before breakfast. 04/22/21  Yes Derek Jack, MD  ?lidocaine-prilocaine (EMLA) cream Apply a small amount to port a cath site and cover with plastic wrap 1 hour prior to infusion appointments 12/16/20  Yes Derek Jack, MD   ?meloxicam (MOBIC) 15 MG tablet Take 1 tablet by mouth once daily ?Patient taking differently: Take 15 mg by mouth daily as needed for pain. 05/20/20  Yes Gosrani, Nimish C, MD  ?metoCLOPramide (REGLAN) 5 MG tablet Take 2

## 2021-06-22 DIAGNOSIS — E039 Hypothyroidism, unspecified: Secondary | ICD-10-CM | POA: Diagnosis not present

## 2021-06-22 DIAGNOSIS — C3491 Malignant neoplasm of unspecified part of right bronchus or lung: Secondary | ICD-10-CM | POA: Diagnosis not present

## 2021-06-22 DIAGNOSIS — R1319 Other dysphagia: Secondary | ICD-10-CM | POA: Diagnosis not present

## 2021-06-22 DIAGNOSIS — R112 Nausea with vomiting, unspecified: Secondary | ICD-10-CM | POA: Diagnosis not present

## 2021-06-22 LAB — CBC
HCT: 41.8 % (ref 36.0–46.0)
Hemoglobin: 13 g/dL (ref 12.0–15.0)
MCH: 28.4 pg (ref 26.0–34.0)
MCHC: 31.1 g/dL (ref 30.0–36.0)
MCV: 91.5 fL (ref 80.0–100.0)
Platelets: 139 10*3/uL — ABNORMAL LOW (ref 150–400)
RBC: 4.57 MIL/uL (ref 3.87–5.11)
RDW: 15.3 % (ref 11.5–15.5)
WBC: 12.2 10*3/uL — ABNORMAL HIGH (ref 4.0–10.5)
nRBC: 0 % (ref 0.0–0.2)

## 2021-06-22 LAB — RENAL FUNCTION PANEL
Albumin: 2.2 g/dL — ABNORMAL LOW (ref 3.5–5.0)
Anion gap: 5 (ref 5–15)
BUN: 28 mg/dL — ABNORMAL HIGH (ref 8–23)
CO2: 29 mmol/L (ref 22–32)
Calcium: 7.8 mg/dL — ABNORMAL LOW (ref 8.9–10.3)
Chloride: 106 mmol/L (ref 98–111)
Creatinine, Ser: 1.19 mg/dL — ABNORMAL HIGH (ref 0.44–1.00)
GFR, Estimated: 47 mL/min — ABNORMAL LOW (ref >60)
Glucose, Bld: 94 mg/dL (ref 70–99)
Phosphorus: 3.1 mg/dL (ref 2.5–4.6)
Potassium: 4.7 mmol/L (ref 3.5–5.1)
Sodium: 140 mmol/L (ref 135–145)

## 2021-06-22 LAB — PROTIME-INR
INR: 2.4 — ABNORMAL HIGH (ref 0.8–1.2)
Prothrombin Time: 26.1 seconds — ABNORMAL HIGH (ref 11.4–15.2)

## 2021-06-22 MED ORDER — SODIUM CHLORIDE 0.9 % IV SOLN: INTRAVENOUS | Status: DC

## 2021-06-22 NOTE — Evaluation (Signed)
Physical Therapy Evaluation ?Patient Details ?Name: Katelyn [name] ?MRN: [id number] ?DOB: 03-03-1940 ?Today's Date: 06/22/2021 ? ?History of Present Illness ? 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 Pt had no loss of balance but fatigues quickly.  Therapist recommended multiple short walks at home to increase strength and endurance.    ?   ? ?Recommendations for follow up therapy are one component of a multi-disciplinary discharge planning process, led by the attending physician.  Recommendations may be updated based on patient status, additional functional criteria and insurance authorization. ? ?Follow Up Recommendations No PT follow up ? ?  ?Assistance Recommended at Discharge Intermittent Supervision/Assistance  ?Patient can return home with the following ? A little help with bathing/dressing/bathroom;Assistance with cooking/housework;Help with stairs or ramp for entrance ? ?  ?Equipment Recommendations None recommended by PT  ?Recommendations for Other Services ?    ?  ?Functional Status Assessment Patient has had a recent decline in their functional status and demonstrates the ability to make significant improvements in function in a reasonable and predictable amount of time.  ? ?  ?Precautions / Restrictions Precautions ?Precautions: None ?Restrictions ?Weight Bearing Restrictions: No  ? ?  ? ?Mobility ? Bed Mobility ?Overal bed mobility: Modified Independent ?  ?  ?  ?  ?  ?  ?  ?  ? ?Transfers ?Overall transfer level: Modified independent ?  ?  ?  ?  ?  ?  ?  ?  ?  ?  ? ?Ambulation/Gait ?Ambulation/Gait assistance: Modified independent (Device/Increase time) ?Gait Distance (Feet): 60 Feet ?Assistive device: Rolling walker (2 wheels) ?Gait  Pattern/deviations: Decreased step length - right, Decreased step length - left ?  ?Gait velocity interpretation: <1.31 ft/sec, indicative of household ambulator ?  ?  ? ? ?  ? ? ? ? ?Pertinent Vitals/Pain Pain Assessment ?Pain Assessment: No/denies pain  ? ? ?Home Living Family/patient expects to be discharged to:: Private residence ?Living Arrangements: Children ?Available Help at Discharge: Family;Available PRN/intermittently ?Type of Home: House ?Home Access: Stairs to enter ?Entrance Stairs-Rails: None ?Entrance Stairs-Number of Steps: 3 ?  ?Home Layout: One level ?Home Equipment: Rollator (4 wheels);Toilet riser;Grab bars - tub/shower ?Additional Comments: step daughter living with pt currently  ?  ?Prior Function Prior Level of Function : Independent/Modified Independent ?  ?  ?  ?  ?  ?  ?  ?  ?  ? ? ?Hand Dominance  ?   ? ?  ?Extremity/Trunk Assessment  ?   ?  ? ?Lower Extremity Assessment ?Lower Extremity Assessment: Generalized weakness ?  ? ?   ?Communication  ?    ?Cognition Arousal/Alertness: Awake/alert ?Behavior During Therapy: [facility] for tasks assessed/performed ?Overall Cognitive Status: Within Functional Limits for tasks assessed ?  ?  ?  ?  ?  ?  ?  ?  ?  ?  ?  ?  ?  ?  ?  ?  ?  ?  ?  ? ?  ?   ?   ? ?Assessment/Plan  ?  ?PT Assessment Patient needs continued PT services  ?PT Problem List Decreased strength;Decreased activity tolerance ? ?   ?  ?PT Treatment Interventions Gait training;Therapeutic exercise   ? ?PT Goals (Current goals can be found in the Care Plan section)  ?Acute Rehab  PT Goals ?Patient Stated Goal: to go home ?PT Goal Formulation: With patient/family ? ?  ?Frequency Min 3X/week ?  ? ? ?   ?AM-PAC PT "6 Clicks" Mobility  ?Outcome Measure Help needed turning from your back to your side while in a flat bed without using bedrails?: None ?Help needed moving from lying on your back to sitting on the side of a flat bed without using bedrails?: None ?Help needed moving to and from a bed to  a chair (including a wheelchair)?: None ?Help needed standing up from a chair using your arms (e.g., wheelchair or bedside chair)?: None ?Help needed to walk in hospital room?: None ?Help needed climbing 3-5 steps with a railing? : A Little ?6 Click Score: 23 ? ?  ?End of Session Equipment Utilized During Treatment: Gait belt ?Activity Tolerance: Patient limited by fatigue ?Patient left: in chair;with call bell/phone within reach;with family/visitor present ?Nurse Communication: Mobility status ?PT Visit Diagnosis: Muscle weakness (generalized) (M62.81) ?  ? ?Time: 5025-6154 ?PT Time Calculation (min) (ACUTE ONLY): 28 min ? ? ?Charges:   PT Evaluation ?$PT Eval Low Complexity: 1 Low ?  ?  ?   ?Rayetta Humphrey, PT CLT ?[phone]  ?06/22/2021, 11:28 AM ? ?

## 2021-06-22 NOTE — Progress Notes (Signed)
?Subjective: ? ?Patient states she has not had a bowel movement since she was hospitalized.  She is passing flatus.  She denies abdominal pain.  She she says she has no difficulty swallowing liquids but she had difficulty with grits this morning. ? ?Current Medications: ? ?Current Facility-Administered Medications:  ?  amiodarone (PACERONE) tablet 200 mg, 200 mg, Oral, Daily, Emokpae, Courage, MD, 200 mg at 06/22/21 0858 ?  feeding supplement (ENSURE ENLIVE / ENSURE PLUS) liquid 237 mL, 237 mL, Oral, BID BM, Adefeso, Oladapo, DO, 237 mL at 06/21/21 1715 ?  labetalol (NORMODYNE) injection 10 mg, 10 mg, Intravenous, Q4H PRN, Emokpae, Courage, MD ?  metoprolol succinate (TOPROL-XL) 24 hr tablet 25 mg, 25 mg, Oral, Daily, Emokpae, Courage, MD, 25 mg at 06/22/21 0859 ?  nystatin (MYCOSTATIN) 100000 UNIT/ML suspension 500,000 Units, 5 mL, Oral, QID, Rehman, Najeeb U, MD, 500,000 Units at 06/22/21 0858 ?  ondansetron (ZOFRAN) injection 4 mg, 4 mg, Intravenous, Q6H PRN, Adefeso, Oladapo, DO ?  pantoprazole (PROTONIX) EC tablet 40 mg, 40 mg, Oral, BID, Emokpae, Courage, MD, 40 mg at 06/22/21 0858 ? ? ?Objective: ?Blood pressure (!) 160/95, pulse 96, temperature (!) 97.3 ?F (36.3 ?C), resp. rate 18, height 5' 10" (1.778 m), weight 64.6 kg, SpO2 92 %. ?Patient is alert and in no acute distress. ?Abdominal examination is normal. ?Trace edema around ankles. ? ?Labs/studies Results: ? ? ? ?  Latest Ref Rng & Units 06/22/2021  ?  6:26 AM 06/21/2021  ?  5:49 AM 06/20/2021  ?  3:35 PM  ?CBC  ?WBC 4.0 - 10.5 K/uL 12.2   11.0   18.1    ?Hemoglobin 12.0 - 15.0 g/dL 13.0   12.4   12.7    ?Hematocrit 36.0 - 46.0 % 41.8   40.4   39.9    ?Platelets 150 - 400 K/uL 139   150   163    ?  ? ?  Latest Ref Rng & Units 06/22/2021  ?  6:26 AM 06/21/2021  ?  5:49 AM 06/20/2021  ?  3:35 PM  ?CMP  ?Glucose 70 - 99 mg/dL 94   78   149    ?BUN 8 - 23 mg/dL 28   35   41    ?Creatinine 0.44 - 1.00 mg/dL 1.19   1.39   1.52    ?Sodium 135 - 145 mmol/L 140   139    138    ?Potassium 3.5 - 5.1 mmol/L 4.7   4.1   4.9    ?Chloride 98 - 111 mmol/L 106   105   103    ?CO2 22 - 32 mmol/L 29   29   29    ?Calcium 8.9 - 10.3 mg/dL 7.8   7.8   8.0    ?Total Protein 6.5 - 8.1 g/dL  5.1   5.5    ?Total Bilirubin 0.3 - 1.2 mg/dL  0.5   0.5    ?Alkaline Phos 38 - 126 U/L  92   111    ?AST 15 - 41 U/L  18   23    ?ALT 0 - 44 U/L  30   35    ?  ? ?  Latest Ref Rng & Units 06/22/2021  ?  6:26 AM 06/21/2021  ?  5:49 AM 06/20/2021  ?  3:35 PM  ?Hepatic Function  ?Total Protein 6.5 - 8.1 g/dL  5.1   5.5    ?Albumin 3.5 - 5.0 g/dL 2.2     2.2   2.3    ?AST 15 - 41 U/L  18   23    ?ALT 0 - 44 U/L  30   35    ?Alk Phosphatase 38 - 126 U/L  92   111    ?Total Bilirubin 0.3 - 1.2 mg/dL  0.5   0.5    ?  ?INR 2.4. ? ?Assessment: ? ?#1.  Esophageal dysphagia.  She has had sporadic dysphagia which became very pronounced 6 days ago.  She is being treated with Mycostatin for presumed Candida as she recently completed Levaquin for pneumonitis and she is also on prednisone.  So far she has not responded.  If she is not any better in the next 24 hours we will proceed with esophagogastroduodenoscopy as long as INR is 1.6 or less. ? ?#2.  Supratherapeutic INR most likely secondary to course of Levaquin which was stopped 3 days ago.  INR is corrected dramatically with oral vitamin K.  Would like to see INR of 1.6 or less if esophageal dilation is to be considered. ? ?#3.  Nausea vomiting and diarrhea.  The symptoms have resolved. ? ?#4.  Chronic atrial fibrillation.  Patient has been on warfarin which was stopped as INR was supratherapeutic.  Anticoagulant can be resumed tomorrow after possible EGD. ? ?#5.  Malnutrition.  Malnutrition secondary to acute on chronic illness.  Serum albumin 2.2. ? ? ?Recommendations ? ?Will check INR in a.m. ?Cancel GI pathogen panel and discontinue enteric precautions. ?Esophagogastroduodenoscopy on 06/23/2021 if she remains with dysphagia and INR 1.6 or less. ? ? ? ? ? ?

## 2021-06-22 NOTE — Progress Notes (Signed)
Patient is alert and oriented x 4. Denies pain or discomfort. Patient resting in bed, as evidence of rise and fall of chest. Patient's only concern is not being given her dose of Prednisone yesterday, as prescribed by her oncologist. Hospitalist was paged regarding patient's concern and notified by myself that the patient was to continue her administration of Prednisone based on doctor's recent notes. No orders as of yet. Patient was informed to mention this to the morning MD. Will continue to monitor the patient during my shift for any changes or concerns. Bed locked, bed alarm on. Call bell within reach. ?

## 2021-06-22 NOTE — Progress Notes (Signed)
?PROGRESS NOTE ? ? ? ? ?Katelyn [name], is a 81 y.o. female, DOB - Feb 18, 1941, ZCH:[id number] ? ?Admit date - 06/20/2021   Admitting Physician Trey Bebee Denton Brick, MD ? ?Outpatient Primary MD for the patient is Celene Squibb, MD ? ?LOS - 1 ? ?Chief Complaint  ?Patient presents with  ? Emesis  ?    ? ? ?Brief Narrative:  ?81 y.o. female with medical history significant of atrial fibrillation on warfarin, right upper lobe lung cancer status post right upper lobectomy with lymph node excision (08/15/2020), hypothyroidism, immunotherapy pneumonitis, GERD, HLD admitted on 06/20/2020 with dysphagia and intractable emesis in the setting of supratherapeutic INR ? ?  ?-Assessment and Plan: ?1)Esophageal Dysphagia with intractable emesis--GI consult appreciated ?Doing better with liquid diet however had significant difficulty with grits ?-Nystatin as ordered by GI team ?-Given persistent esophageal dysphagia plan is for EGD with dilatation on 06/23/2021 -hopefully INR comes down below 1.6 ? ?2) diarrhea and dehydration--doubt significant GI infection ?-Okay to taper IV fluids as she is tolerating some oral fluids ?-may consider as needed Imodium ? ?3) chronic atrial fibrillation--- continue amiodarone and Toprol-XL for rate control ?-INR supratherapeutic currently down to 2.4 from above 10 on admission ?-Supratherapeutic INR is due to recent Levaquin use with cytochrome P450 interaction in the setting of poor oral intake with limited vitamin K ingestion ?-Redosed with vitamin K ?-Hold Coumadin for now ? 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 Delton Coombes, she continues to be on chemotherapy with Keytruda ?  ?5) hypothyroidism--- continue Synthroid ? ?6)HLD--continue Crestor ? ?7)Immunotherapy pneumonitis ?Patient was started on Levaquin and prednisone, she already completed Levaquin ?Continue prednisone ? ?8) leukocytosis--- may be reactive in the setting of intractable emesis or  related to to steroid therapy induced demargination ?-No evidence of acute infection at this time continue to monitor ? ? ?Disposition/Need for in-Hospital Stay- patient unable to be discharged at this time due to --dysphagia and intractable emesis requiring IV fluids and possible EGD with dilatation tentatively on 06/23/2021--unable to discharge pending tolerance of oral intake* ? ?Status is: Inpatient  ? ?Disposition: The patient is from: Home ?             Anticipated d/c is to: Home ?             Anticipated d/c date is: 2 days ?             Patient currently is not medically stable to d/c. ?Barriers: Not Clinically Stable-  ? ?Code Status : -  Code Status: Full Code  ? ?Family Communication:   NA (patient is alert, awake and coherent)  ? ?DVT Prophylaxis  :   - SCDs   SCDs Start: 06/20/21 2036 ? ?Lab Results  ?Component Value Date  ? PLT 139 (L) 06/22/2021  ? ? ?Inpatient Medications ? ?Scheduled Meds: ? amiodarone  200 mg Oral Daily  ? feeding supplement  237 mL Oral BID BM  ? metoprolol succinate  25 mg Oral Daily  ? nystatin  5 mL Oral QID  ? pantoprazole  40 mg Oral BID  ? ?Continuous Infusions: ? ? ?PRN Meds:.labetalol, ondansetron (ZOFRAN) IV ? ? ?Anti-infectives (From admission, onward)  ? ? None  ? ?  ? ?  ? ?Subjective: ?Katelyn [name] today has  No chest pain,   ?No fever  Or chills  ?Tolerating liquid diet okay had trouble taking grits--overall dysphagia persist ?-No bleeding concerns ? ? ?Objective: ?Vitals:  ?  06/21/21 1300 06/21/21 2143 06/22/21 0348 06/22/21 1300  ?BP: 110/61 128/76 (!) 160/95 (!) 146/75  ?Pulse: 85 89 96 86  ?Resp: 18 18 18 18   ?Temp: 97.8 ?F (36.6 ?C) 97.8 ?F (36.6 ?C) (!) 97.3 ?F (36.3 ?C) 98.1 ?F (36.7 ?C)  ?TempSrc: Oral Oral  Oral  ?SpO2: 96% 96% 92% 97%  ?Weight:      ?Height:      ? ? ?Intake/Output Summary (Last 24 hours) at 06/22/2021 1722 ?Last data filed at 06/22/2021 0400 ?Gross per 24 hour  ?Intake 440 ml  ?Output --  ?Net 440 ml  ? ?Filed Weights  ? 06/20/21 1412  06/20/21 2300  ?Weight: 64 kg 64.6 kg  ? ? ?Physical Exam ? ?Gen:- Awake Alert,  in no apparent distress  ?HEENT:- [place].AT, No sclera icterus ?Neck-Supple Neck,No JVD,.  ?Lungs-  CTAB , fair symmetrical air movement ?CV- S1, S2 normal, regular  ?Abd-  +ve B.Sounds, Abd Soft, No tenderness,    ?Extremity/Skin:- No  edema, pedal pulses present  ?Psych-affect is appropriate, oriented x3 ?Neuro-generalized weakness, no new focal deficits, no tremors ? ?Data Reviewed: I have personally reviewed following labs and imaging studies ? ?CBC: ?Recent Labs  ?Lab 06/20/21 ?1535 06/21/21 ?1975 06/22/21 ?8832  ?WBC 18.1* 11.0* 12.2*  ?NEUTROABS 16.9*  --   --   ?HGB 12.7 12.4 13.0  ?HCT 39.9 40.4 41.8  ?MCV 91.3 92.4 91.5  ?PLT 163 150 139*  ? ?Basic Metabolic Panel: ?Recent Labs  ?Lab 06/20/21 ?1535 06/21/21 ?5498 06/22/21 ?2641  ?NA 138 139 140  ?K 4.9 4.1 4.7  ?CL 103 105 106  ?CO2 29 29 29   ?GLUCOSE 149* 78 94  ?BUN 41* 35* 28*  ?CREATININE 1.52* 1.39* 1.19*  ?CALCIUM 8.0* 7.8* 7.8*  ?MG  --  1.8  --   ?PHOS  --  3.3 3.1  ? ?GFR: ?Estimated Creatinine Clearance: 39.7 mL/min (A) (by C-G formula based on SCr of 1.19 mg/dL (H)). ?Liver Function Tests: ?Recent Labs  ?Lab 06/20/21 ?1535 06/21/21 ?5830 06/22/21 ?9407  ?AST 23 18  --   ?ALT 35 30  --   ?ALKPHOS 111 92  --   ?BILITOT 0.5 0.5  --   ?PROT 5.5* 5.1*  --   ?ALBUMIN 2.3* 2.2* 2.2*  ? ?Cardiac Enzymes: ?No results for input(s): CKTOTAL, CKMB, CKMBINDEX, TROPONINI in the last 168 hours. ?BNP (last 3 results) ?No results for input(s): PROBNP in the last 8760 hours. ?HbA1C: ?No results for input(s): HGBA1C in the last 72 hours. ?Sepsis Labs: ?@LABRCNTIP (procalcitonin:4,lacticidven:4) ?)No results found for this or any previous visit (from the past 240 hour(s)).  ? ? ?Radiology Studies: ?No results found. ? ?inScheduled Meds: ? amiodarone  200 mg Oral Daily  ? feeding supplement  237 mL Oral BID BM  ? metoprolol succinate  25 mg Oral Daily  ? nystatin  5 mL Oral QID  ?  pantoprazole  40 mg Oral BID  ? ?Continuous Infusions: ? ? LOS: 1 day  ? ?Roxan Hockey M.D on 06/22/2021 at 5:22 PM ? ?Go to [email or web address] - for contact info ? ?[facility] - Office  [phone] ? ?If 7PM-7AM, please contact night-coverage ?[email or web address] ?Password [email or web address] ?06/22/2021, 5:22 PM  ? ? ?

## 2021-06-23 DIAGNOSIS — K219 Gastro-esophageal reflux disease without esophagitis: Secondary | ICD-10-CM | POA: Diagnosis not present

## 2021-06-23 DIAGNOSIS — R1319 Other dysphagia: Secondary | ICD-10-CM | POA: Diagnosis not present

## 2021-06-23 DIAGNOSIS — E440 Moderate protein-calorie malnutrition: Secondary | ICD-10-CM | POA: Insufficient documentation

## 2021-06-23 DIAGNOSIS — E039 Hypothyroidism, unspecified: Secondary | ICD-10-CM | POA: Diagnosis not present

## 2021-06-23 DIAGNOSIS — R112 Nausea with vomiting, unspecified: Secondary | ICD-10-CM | POA: Diagnosis not present

## 2021-06-23 LAB — PROTIME-INR
INR: 1.8 — ABNORMAL HIGH (ref 0.8–1.2)
Prothrombin Time: 20.8 seconds — ABNORMAL HIGH (ref 11.4–15.2)

## 2021-06-23 NOTE — Progress Notes (Signed)
? ?Gastroenterology Progress Note  ? ?Referring Provider: Dr. Josephine Cables ?Primary Care Physician:  Celene Squibb, MD ?Primary Gastroenterologist:  Dr. Gala Romney  ? ?Patient ID: Katelyn [name]; [id number]; 06/20/1940  ? ? ?Subjective  ? ?No odynophagia. Food gets to mid esophagus and stops, then regurgitates. Grits are difficult to tolerate. Did ok with clear liquids.  ? ? ?Objective  ? ?Vital signs in last 24 hours ?Temp:  [98.1 ?F (36.7 ?C)-98.2 ?F (36.8 ?C)] 98.2 ?F (36.8 ?C) (04/23 2102) ?Pulse Rate:  [82-86] 82 (04/23 2102) ?Resp:  [18] 18 (04/23 2102) ?BP: (146-155)/(75-89) 155/89 (04/23 2102) ?SpO2:  [97 %] 97 % (04/23 2102) ?Last BM Date : 06/20/21 ? ?Physical Exam ?General:   Alert and oriented, pleasant ?Head:  Normocephalic and atraumatic. ?Eyes:  No icterus, sclera clear. Conjuctiva pink.  ?Abdomen:  Bowel sounds present, soft, non-tender, non-distended. No HSM or hernias noted. No rebound or guarding. No masses appreciated  ?Extremities:  Without edema. ?Neurologic:  Alert and  oriented x4 ? ?Intake/Output from previous day: ?04/23 0701 - 04/24 0700 ?In: 0  ?Out: 900 [Urine:900] ?Intake/Output this shift: ?No intake/output data recorded. ? ?Lab Results ? ?Recent Labs  ?  06/20/21 ?1535 06/21/21 ?2025 06/22/21 ?4270  ?WBC 18.1* 11.0* 12.2*  ?HGB 12.7 12.4 13.0  ?HCT 39.9 40.4 41.8  ?PLT 163 150 139*  ? ?BMET ?Recent Labs  ?  06/20/21 ?1535 06/21/21 ?6237 06/22/21 ?6283  ?NA 138 139 140  ?K 4.9 4.1 4.7  ?CL 103 105 106  ?CO2 29 29 29   ?GLUCOSE 149* 78 94  ?BUN 41* 35* 28*  ?CREATININE 1.52* 1.39* 1.19*  ?CALCIUM 8.0* 7.8* 7.8*  ? ?LFT ?Recent Labs  ?  06/20/21 ?1535 06/21/21 ?1517 06/22/21 ?6160  ?PROT 5.5* 5.1*  --   ?ALBUMIN 2.3* 2.2* 2.2*  ?AST 23 18  --   ?ALT 35 30  --   ?ALKPHOS 111 92  --   ?BILITOT 0.5 0.5  --   ? ?PT/INR ?Recent Labs  ?  06/22/21 ?0626 06/23/21 ?7371  ?LABPROT 26.1* 20.8*  ?INR 2.4* 1.8*  ? ? ?Studies/Results ?DG Chest 2 View ? ?Result Date: 06/13/2021 ?CLINICAL DATA:  81 year old female  with cough and shortness of breath EXAM: CHEST - 2 VIEW COMPARISON:  Chest x-ray 03/18/2021, PET-CT 05/08/2021 FINDINGS: Cardiomediastinal silhouette unchanged in size and contour. Surgical changes of prior TAVR. Unchanged left subclavian port catheter. Increasing opacity at the periphery of the right lung with pleuroparenchymal thickening, opacification of the base of the lung with obscuration the right hemidiaphragm and the right heart border. Interval resolution of the previous gas component of the hydropneumothorax. Surgical changes at the apex of the right lung. Aeration of the left lung is relatively similar to the chest x-ray of 03/18/2021. Trace blunting at the left costophrenic angle and sulcus on the lateral view. IMPRESSION: Increasing right-sided pleural fluid/pleural disease, with decreasing aeration of the right lung secondary to atelectasis/consolidation. Progressing pleural malignancy not excluded. Left lung relatively well aerated, without evidence of lobar pneumonia. Trace left-sided pleural effusion. Electronically Signed   By: Corrie Mckusick D.O.   On: 06/13/2021 14:35  ? ?DG Chest Portable 1 View ? ?Result Date: 06/20/2021 ?CLINICAL DATA:  Recent pneumonia in a 81 year old female. EXAM: PORTABLE CHEST 1 VIEW COMPARISON:  June 12, 2021. FINDINGS: LEFT Port-A-Cath terminates at the distal aspect of the superior vena cava as before. Signs of TAVR and aortic atherosclerosis with stable cardiomediastinal contours. Moderate to large RIGHT-sided effusion is diminished in  size. Postoperative changes with suture line in the RIGHT upper lobe. Associated airspace disease at the RIGHT lung base is improved though still with considerable opacification and obscured RIGHT hemidiaphragm. The LEFT chest is clear. On limited assessment there is no acute skeletal finding with EKG leads projecting over the patient's chest. IMPRESSION: 1. Diminished size of RIGHT-sided effusion and associated airspace disease at the  RIGHT lung base. 2. No new abnormalities. Electronically Signed   By: Zetta Bills M.D.   On: 06/20/2021 15:57   ? ?Assessment  ?81 y.o. female with a history of large cell carcinoma of right lung status post right upper lobectomy in June 2022, history of atrial fibrillation on warfarin,  remote CVA, hypothyroidism, and history of pneumonitis secondary to immunotherapy and had completed on Levaquin, presenting with N/V, diarrhea, and dysphagia. Noted to also have supratherapeutic INR. ? ?Dysphagia: new onset, starting approximately 1 week ago. Able to tolerate liquids but difficulty with any increased textures. Empirically on Nystatin due to presumed Candida. Still unable to advance diet. As INR is 1.8, will hold off on EGD today and anticipate tomorrow.  ? ?Diarrhea: no further diarrhea.  ? ? ? ?Plan / Recommendations  ?Continue PPI BID ?Continue nystatin ?May have full liquid diet ?NPO after midnight ?Coumadin remains on hold ?Recheck INR in am ?Hopeful EGD +/- dilation tomorrow.  ? ? ? LOS: 2 days  ? ? 06/23/2021, 9:28 AM ? ?Annitta Needs, PhD, ANP-BC ?[facility]  ? ? ? ?

## 2021-06-23 NOTE — Progress Notes (Signed)
OT Cancellation Note ? ?Patient Details ?Name: Katelyn [name] ?MRN: [id number] ?DOB: [birth year] ? ? ?Cancelled Treatment:    Reason Eval/Treat Not Completed: OT screened, no needs identified, will sign off.  ?Patient is currently functioning at modified independence and does not require any additional OT services.  ? ?Ailene Ravel, OTR/L,CBIS  ?[phone] ? ?06/23/2021, 10:12 AM ?

## 2021-06-23 NOTE — Progress Notes (Signed)
Initial Nutrition Assessment ? ?DOCUMENTATION CODES:  ? ?Non-severe (moderate) malnutrition in context of chronic illness ? ?INTERVENTION:  ?- Continue home Equate protein shakes TID ? ?- "High Calorie, High Protein Nutrition Therapy" handout added to AVS ? ?NUTRITION DIAGNOSIS:  ? ?Moderate Malnutrition related to chronic illness, cancer and cancer related treatments as evidenced by mild fat depletion, mild muscle depletion. ? ?GOAL:  ? ?Patient will meet greater than or equal to 90% of their needs ? ? ?MONITOR:  ? ?PO intake, Supplement acceptance, Diet advancement, Labs, Weight trends ? ?REASON FOR ASSESSMENT:  ? ?Malnutrition Screening Tool ?  ? ?ASSESSMENT:  ? ?Pt admitted from home with several days of nausea, vomiting, diarrhea and generalized weakness. PMH significant for afib on warfarin, R upper lobe lung cancer s/p R upper lobectomy with lymph node excision (08/15/20), hypothyroidism, immunotherapy pneumonitis, GERD and HLD. ? ?Pt and family member at bedside provided nutrition and weight history. Within the last week she has had a very poor appetite given ongoing nausea, vomiting and diarrhea which she reports is much improved. She has had difficulty keeping foods down as she reports it gets stuck in her chest and she ends up throwing it back up. Noted plans for EGD with esophageal dilation tomorrow. Prior to this past week, she endorses eating poorly with a gradual decrease in appetite and swallowing difficulty since July d/t difficulty after lobectomy and tolerance to chemotherapy drugs. She has been drinking 3 Equate protein drinks daily, milks and bites of banana. We discussed once her diet is able to advance, continuing to drink these protein drinks and small snacks throughout the day. Provided "High Protein, High Calorie Nutrition Therapy" handout to AVS for discharge. She brought her Equate protein drinks from home. Recommended continuing these. She does not think she could tolerate a MVI, even  crushed at this time as she does not like applesauce and does not think she could tolerate it added to pudding.  ? ?Pt states that she has had significant weight loss since July and reports that her weight is down from ~200 lbs to 142 lbs.  ? ?Reviewed weight history. Pt noted to have a 7% weight loss within the last 3 months which is not clinically significant for time frame however is concerning given nutritional risk d/t inadequate PO intake and history of cancer/cancer related treatments.  ? ?Noted nursing documentation of moderate pitting BLE.  ? ?Medications: protonix ? ?Labs: BUN 28, Cr 1.19 ? ?NUTRITION - FOCUSED PHYSICAL EXAM: ? ?Flowsheet Row Most Recent Value  ?Orbital Region Mild depletion  ?Upper Arm Region Mild depletion  ?Thoracic and Lumbar Region Mild depletion  ?Buccal Region No depletion  ?Temple Region No depletion  ?Clavicle Bone Region Severe depletion  ?Clavicle and Acromion Bone Region Moderate depletion  ?Scapular Bone Region Mild depletion  ?Dorsal Hand No depletion  ?Patellar Region No depletion  ?Anterior Thigh Region Mild depletion  ?Posterior Calf Region Mild depletion  ?Edema (RD Assessment) Moderate  [BLE]  ?Hair Reviewed  ?Eyes Reviewed  ?Mouth Reviewed  ?Skin Reviewed  ?Nails Reviewed  ? ?  ? ? ?Diet Order:   ?Diet Order   ? ?       ?  Diet NPO time specified Except for: Sips with Meds  Diet effective midnight       ?  ?  Diet full liquid Room service appropriate? Yes; Fluid consistency: Thin  Diet effective now       ?  ? ?  ?  ? ?  ? ? ?  EDUCATION NEEDS:  ? ?Education needs have been addressed ? ?Skin:  Skin Assessment: Reviewed RN Assessment ? ?Last BM:  4/21 ? ?Height:  ? ?Ht Readings from Last 1 Encounters:  ?06/20/21 5\' 10"  (1.778 m)  ? ? ?Weight:  ? ?Wt Readings from Last 1 Encounters:  ?06/20/21 64.6 kg  ? ?BMI:  Body mass index is 20.43 kg/m?. ? ?Estimated Nutritional Needs:  ? ?Kcal:  1900-2100 ? ?Protein:  95-110g ? ?Fluid:  >/=1.9L ? ?Clayborne Dana, RDN, LDN ?Clinical  Nutrition ?

## 2021-06-23 NOTE — Discharge Instructions (Addendum)
High Calorie, High Protein Nutrition Therapy ? ?A high-calorie, high-protein diet has been recommended to you. Your registered dietitian nutritionist (RDN) may have recommended this diet because you are having difficulty eating enough calories throughout the day, you have lost weight, and/or you need to add protein to your diet. ?Sometimes you may not feel like eating, even if you know the importance of good nutrition. The recommendations in this handout can help you with the following: ?Regaining your strength and energy ?Keeping your body healthy ?Healing and recovering from surgery or illness and fighting infection ? ?Tips ?Schedule Your Meals and Snacks ?Several small meals and snacks are often better tolerated and digested than large meals. ?Strategies ?Plan to eat 3 meals and 3 snacks daily. ?Experiment with timing meals to find out when you have a larger appetite. ?Appetite may be greatest in the morning after not eating all night so you may prefer to eat your larger meals and snacks in the morning and at lunch. ?Breakfast-type foods are often better tolerated so eat foods such as eggs, pancakes, waffles and cereal for any meal or snack. ?Carry snacks with you so you are prepared to eat every 2 to 3 hours. ?Determine what works best for you if your body?s cues for feeling hungry or full are not working. ?Eat a small meal or snack even if you don?t feel hungry. ?Set a timer to remind you when it is time to eat. ?Take a walk before you eat (with health care provider?s approval). ?Light or moderate physical activity can help you maintain muscle and increase your appetite. ? ?Make Eating Enjoyable ?Taking steps to make the experience enjoyable may help to increase your interest in eating and improve your appetite. ?Strategies: ?Eat with others whenever possible. ?Include your favorite foods to make meals more enjoyable. ?Try new foods. ?Save your beverage for the end of the meal so that you have more room for  food before you get full. ? ?Add Calories to Your Meals and Snacks ?Try adding calorie-dense foods so that each bite provides more nutrition. ?Strategies ?Drink milk, chocolate milk, soy milk, or smoothies instead of low-calorie beverages such as diet drinks or water. ?Cook with milk or soy milk instead of water when making dishes such as hot cereal, cocoa, or pudding. ?Add jelly, jam, honey, butter or margarine to bread and crackers. Add jam or fruit to ice cream and as a topping over cake. ?Mix dried fruit, nuts, granola, honey, or dry cereal with yogurt or hot cereals. ?Enjoy snacks such as milkshakes, smoothies, pudding, ice cream, or custard. ?Blend a fruit smoothie of a banana, frozen berries, milk or soy milk, and 1 tablespoon nonfat powdered milk or protein powder. ? ?Add Protein to Your Meals and Snacks ?Choose at least one protein food at each meal and snack to increase your daily intake. ?Strategies ?Add ? cup nonfat dry milk powder or protein powder to make a high-protein milk to drink or to use in recipes that call for milk. Vanilla or peppermint extract or unsweetened cocoa powder could help to boost the flavor. ?Add hard-cooked eggs, leftover meat, grated cheese, canned beans or tofu to noodles, rice, salads, sandwiches, soups, casseroles, pasta, tuna and other mixed dishes. ?Add powdered milk or protein powder to hot cereals, meatloaf, casseroles, scrambled eggs, sauces, cream soups, and shakes. ?Add beans and lentils to salads, soups, casseroles, and vegetable dishes. ?Eat cottage cheese or yogurt, especially [place] yogurt, with fruit as a snack or dessert. ?Eat peanut or other  nut butters on crackers, bread, toast, waffles, apples, bananas or celery sticks. Add it to milkshakes, smoothies, or desserts. ?Consider a ready-made protein shake. Your RDN will make recommendations. ? ?Add Fats to Your Meals and Snacks ?Try adding fats to your meals and snacks. Fat provides more calories in fewer bites than  carbohydrate or protein and adds flavors to your foods. ?Strategies ?Snack on nuts and seeds or add them to foods like salads, pasta, cereals, yogurt, and ice cream.  ?Saut? or stir-fry vegetables, meats, chicken, fish or tofu in olive or canola oil.  ?Add olive oil, other vegetable oils, butter or margarine to soups, vegetables, potatoes, cooked cereal, rice, pasta, bread, crackers, pancakes, or waffles. ?Snack on olives or add to pasta, pizza, or salad. ?Add avocado or guacamole to your salads, sandwiches, and other entrees. ?Include fatty fish such as salmon in your weekly meal plan. ?For general food safety tips, especially for clients with immunocompromised conditions, ask your RDN for the Food Safety Nutrition Therapy handout. ? ?Small Meal and Snack Ideas ?These snacks and meals are recommended when you have to eat but aren?t necessarily hungry.  They are good choices because they are high in protein and high in calories.  ? ?2 graham crackers ?2 tablespoons peanut or other nut butter ?1 cup milk 2 slices whole wheat toast topped with: ?? avocado, mashed ?Seasoning of your choice  ?? cup [place] yogurt ?? cup fruit ?? cup granola 2 deviled egg halves ?5 whole wheat crackers  ?1 cup cream of tomato soup ?? grilled cheese sandwich 1 toasted waffle topped with: ?2 tablespoons peanut or nut butter ?1 tablespoon jam  ?Trail mix made with: ? cup nuts ?? cup dried fruit ?? cup cold cereal, any variety ? cup oatmeal or cream of wheat cereal ?1 tablespoon peanut or nut butter ?? cup diced fruit  ? ?High-Calorie, High-Protein Sample 1-Day Menu ?Breakfast 1 egg, scrambled ?1 ounce cheddar cheese ?1 English muffin, whole wheat ?1 tablespoon margarine ?1 tablespoon jam ?? cup orange juice, fortified with calcium and vitamin D  ?Morning Snack 1 tablespoon peanut butter ?1 banana ?1 cup 1% milk  ?Lunch Tuna salad sandwich made with: 2 slices bread, whole wheat ?3 ounces tuna mixed with: ?1 tablespoon mayonnaise ?? cup pudding   ?Afternoon Snack ? cup hummus ?? cup carrots ?1 pita  ?Evening Meal Enchilada casserole made with: 2 corn tortillas ?3 ounces ground beef, cooked ?? cup black beans, cooked ?? cup corn, cooked ?1 ounce grated cheddar cheese ?? cup enchilada sauce ?? avocado, sliced, topping for enchilada ?1 tablespoon sour cream, topping for enchilada ?Salad: ? cup lettuce, shredded ?? cup tomatoes, chopped, for salad ?1 tablespoon olive oil and vinegar dressing, for salad  ?Evening Snack ? cup [place] yogurt ?? cup blueberries ?? cup granola  ? ?Copyright 2020 ? Academy of [employer]etics. All rights reserved.  ? ? ? ? ? ?IMPORTANT INFORMATION: PAY CLOSE ATTENTION  ? ?PHYSICIAN DISCHARGE INSTRUCTIONS ? ?Follow with Primary care provider  Celene Squibb, MD  and other consultants as instructed by your Hospitalist Physician ? ?SEEK MEDICAL CARE OR RETURN TO EMERGENCY ROOM IF SYMPTOMS COME BACK, WORSEN OR NEW PROBLEM DEVELOPS  ? ?Please note: ?You were cared for by a hospitalist during your hospital stay. Every effort will be made to forward records to your primary care provider.  You can request that your primary care provider send for your hospital records if they have not received them.  Once you  are discharged, your primary care physician will handle any further medical issues. Please note that NO REFILLS for any discharge medications will be authorized once you are discharged, as it is imperative that you return to your primary care physician (or establish a relationship with a primary care physician if you do not have one) for your post hospital discharge needs so that they can reassess your need for medications and monitor your lab values. ? ?Please get a complete blood count and chemistry panel checked by your Primary MD at your next visit, and again as instructed by your Primary MD. ? ?Get Medicines reviewed and adjusted: ?Please take all your medications with you for your next visit with your Primary  MD ? ?Laboratory/radiological data: ?Please request your Primary MD to go over all hospital tests and procedure/radiological results at the follow up, please ask your primary care provider to get all Hospital records sen

## 2021-06-23 NOTE — TOC Initial Note (Signed)
Transition of Care (TOC) - Initial/Assessment Note  ? ? ?Patient Details  ?Name: Katelyn [name] ?MRN: [id number] ?Date of Birth: [birth year] ? ?Transition of Care (TOC) CM/SW Contact:    ?Shade Flood, LCSW ?Phone Number: ?06/23/2021, 10:55 AM ? ?Clinical Narrative:                 ? ?Pt admitted from home. She has high readmission risk score. Spoke with pt's daughter today to review dc planning. Pt lives in her own home and another daughter lives with her. That daughter works full time. Pt's daughter, [name], lives in [place] but has been trying to be available for pt as much as possible as she is retired. Pt has a walker and 3in1 at home currently. Dtr states they would like tub bench at dc. They would also like HH PT/OT/Aide and have used [facility] in the past. ? ?Dtr states pt is able to get to appointments and obtain medications as needed. MD anticipating dc in 1-2 days. TOC will follow and continue to assist with dc planning. ? ?Expected Discharge Plan: Home/Self Care ?Barriers to Discharge: Continued Medical Work up ? ? ?Patient Goals and CMS Choice ?Patient states their goals for this hospitalization and ongoing recovery are:: go home with [facility] ?CMS Medicare.gov Compare Post Acute Care list provided to:: Patient Represenative (must comment) ?Choice offered to / list presented to : Adult Children ? ?Expected Discharge Plan and Services ?Expected Discharge Plan: Home/Self Care ?In-house Referral: Clinical Social Work ?  ?Post Acute Care Choice: Home Health, Durable Medical Equipment ?Living arrangements for the past 2 months: [place] ?                ?  ?  ?  ?  ?  ?  ?  ?  ?  ?  ? ?Prior Living Arrangements/Services ?Living arrangements for the past 2 months: [place] ?Lives with:: Adult Children ?Patient language and need for interpreter reviewed:: Yes ?Do you feel safe going back to the place where you live?: Yes      ?Need for Family Participation in Patient Care: Yes (Comment) ?Care giver  support system in place?: Yes (comment) ?Current home services: DME ?Criminal Activity/Legal Involvement Pertinent to Current Situation/Hospitalization: No - Comment as needed ? ?Activities of Daily Living ?Home Assistive Devices/Equipment: Wheelchair, Environmental consultant (specify type) ?ADL Screening (condition at time of admission) ?Patient's cognitive ability adequate to safely complete daily activities?: Yes ?Is the patient deaf or have difficulty hearing?: No ?Does the patient have difficulty seeing, even when wearing glasses/contacts?: No ?Does the patient have difficulty concentrating, remembering, or making decisions?: No ?Patient able to express need for assistance with ADLs?: Yes ?Does the patient have difficulty dressing or bathing?: No ?Independently performs ADLs?: No ?Communication: Independent ?Dressing (OT): Independent ?Grooming: Independent ?Feeding: Independent ?Bathing: Independent ?Toileting: Needs assistance ?Is this a change from baseline?: Pre-admission baseline ?In/Out Bed: Needs assistance ?Is this a change from baseline?: Pre-admission baseline ?Walks in Home: Independent with device (comment) ?Does the patient have difficulty walking or climbing stairs?: Yes ?Weakness of Legs: Both ?Weakness of Arms/Hands: None ? ?Permission Sought/Granted ?Permission sought to share information with : Customer service manager ?Permission granted to share information with : Yes, Verbal Permission Granted ?   ? Permission granted to share info w AGENCY: [place], DME ?   ?   ? ?Emotional Assessment ?  ?  ?  ?Orientation: : Oriented to Self, Oriented to Place, Oriented to  Time, Oriented to Situation ?  Alcohol / Substance Use: Not Applicable ?Psych Involvement: No (comment) ? ?Admission diagnosis:  Supratherapeutic INR [R79.1] ?Nausea vomiting and diarrhea [R11.2, R19.7] ?Nausea and vomiting [R11.2] ?Dysphagia [R13.10] ?Patient Active Problem List  ? Diagnosis Date Noted  ? Dysphagia 06/21/2021  ? Nausea and vomiting  06/20/2021  ? Generalized weakness 06/20/2021  ? Diarrhea 06/20/2021  ? Leukocytosis 06/20/2021  ? Hypoalbuminemia 06/20/2021  ? Supratherapeutic INR 06/20/2021  ? Seasonal allergic rhinitis 02/26/2021  ? Chronic kidney disease 02/17/2021  ? Basal cell carcinoma of face 11/15/2020  ? Menopausal and postmenopausal disorder 11/15/2020  ? 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 Hypothyroidism 11/14/2020  ? 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 Nausea 11/14/2020  ? Osteoarthritis 11/14/2020  ? Vitamin D deficiency 11/14/2020  ? Long term (current) use of anticoagulants 10/22/2020  ? Pleural effusion on right 09/18/2020  ? Cancer of upper lobe of right lung ([place]) 09/04/2020  ? S/P Robotic Assisted Right Video Thoracoscopy with Right Upper Lobectomy 08/15/2020  ? Status post thoracotomy 08/15/2020  ? 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 Mass of upper lobe of right lung 07/11/2020  ? Thrombocytopenia ([place]) 03/20/2019  ? Cerebrovascular disease 12/05/2018  ? Environmental and seasonal allergies 10/13/2017  ? Gastroesophageal reflux disease 04/13/2017  ? Esophageal dysphagia 04/13/2017  ? Chronic anticoagulation 03/10/2017  ? Current long-term use of postmenopausal hormone replacement therapy 03/10/2017  ? Non-allergic rhinitis 11/10/2016  ? Paroxysmal atrial fibrillation ([place]) 02/13/2016  ? 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 Tachycardia-bradycardia syndrome ([place])   ? Atrial fibrillation ([place]) 08/29/2014  ? LBBB (left bundle branch block), chronic 08/29/2014  ? CAD in native artery   ? Peripheral vascular disease ([place]) 12/13/2012  ? Essential hypertension 12/13/2012  ? Mixed hyperlipidemia 12/13/2012  ? Atherosclerosis of native arteries of extremity with intermittent claudication ([place]) 08/10/2011  ? ?PCP:  Celene Squibb, MD ?Pharmacy:   ?[facility] [place] [address] ?[address] ?[place] [address] ?Phone:  [phone] Fax: [phone] ? ? ? ? ?Social Determinants of Health (SDOH) Interventions ?  ? ?Readmission Risk Interventions ? ?  06/23/2021  ? 10:49 AM  ?Readmission Risk Prevention Plan  ?Transportation Screening Complete  ?Palliative Care Screening Not Applicable  ?Medication Review Press photographer) Complete  ? ? ? ?

## 2021-06-23 NOTE — Progress Notes (Addendum)
?PROGRESS NOTE ? ? ? ? ?Katelyn [name], is a 81 y.o. female, DOB - 1940-11-02, SKA:[id number] ? ?Admit date - 06/20/2021   Admitting Physician Katelyn Dilger Denton Brick, MD ? ?Outpatient Primary MD for the patient is Katelyn Squibb, MD ? ?LOS - 2 ? ?Chief Complaint  ?Patient presents with  ? Emesis  ?    ? ? ?Brief Narrative:  ?81 y.o. female with medical history significant of atrial fibrillation on warfarin, right upper lobe lung cancer status post right upper lobectomy with lymph node excision (08/15/2020), hypothyroidism, immunotherapy pneumonitis, GERD, HLD admitted on 06/20/2020 with dysphagia and intractable emesis in the setting of supratherapeutic INR ? ?  ?-Assessment and Plan: ?1)Esophageal Dysphagia with intractable emesis--GI consult appreciated ?Doing better with liquid diet however had significant difficulty with grits ?-Nystatin as ordered by GI team ?-Given persistent esophageal dysphagia plan is for EGD with dilatation on 06/24/2021 -hopefully INR comes down below 1.6 ?-INR is 1.8 today GI service would like to defer EGD with dilatation on  06/24/2021 ? ?2) diarrhea and dehydration--doubt significant GI infection ?-Okay to taper IV fluids as she is tolerating some oral fluids ?-may consider as needed Imodium ? ?3) chronic atrial fibrillation--- continue amiodarone and Toprol-XL for rate control ?-INR supratherapeutic currently down to 2.4 from above 10 on admission ?-Supratherapeutic INR is due to recent Levaquin use with cytochrome P450 interaction in the setting of poor oral intake with limited vitamin K ingestion ?INR trending down after vitamin K was given  ?-Hold Coumadin for now ? 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  ?5) hypothyroidism--- continue Synthroid ? ?6)HLD--continue Crestor ? ?7)Immunotherapy pneumonitis ?Patient was started on Levaquin and prednisone, she already completed  Levaquin ?Continue prednisone ? ?8) leukocytosis--- may be reactive in the setting of intractable emesis or related to to steroid therapy induced demargination ?-No evidence of acute infection at this time continue to monitor ? ?9) generalized weakness and deconditioning----in the setting of poor oral intake due to dysphagia ?-Patient will benefit from home health PT/OT rehab ? 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  creatinine on admission= 1.52 (peak) ,  ?baseline creatinine =1.0    , ? creatinine is now= 1.2 ,  ?-Renal function is improving with hydration ?renally adjust medications, avoid nephrotoxic agents / dehydration  / hypotension ? ? ?Disposition/Need for in-Hospital Stay- patient unable to be discharged at this time due to --dysphagia and intractable emesis requiring IV fluids and possible EGD with dilatation --unable to discharge pending tolerance of oral intake- ?-INR is 1.8 today GI service would like to defer EGD with dilatation on 3 06/24/2021 ? ?Status is: Inpatient  ? ?Disposition: The patient is from: Home ?             Anticipated d/c is to: Home ?             Anticipated d/c date is: 2 days ?             Patient currently is not medically stable to d/c. ?Barriers: Not Clinically Stable-  ? ?Code Status : -  Code Status: Full Code  ? ?Family Communication:    (patient is alert, awake and coherent)  ?-Discussed with daughter Katelyn [name] at bedside ? ?DVT Prophylaxis  :   - SCDs   SCDs Start: 06/20/21 2036 ? ?  Lab Results  ?Component Value Date  ? PLT 139 (L) 06/22/2021  ? ?Inpatient Medications ? ?Scheduled Meds: ? amiodarone  200 mg Oral Daily  ? feeding supplement  237 mL Oral BID BM  ? metoprolol succinate  25 mg Oral Daily  ? nystatin  5 mL Oral QID  ? pantoprazole  40 mg Oral BID  ? ?Continuous Infusions: ? sodium chloride 20 mL/hr at 06/22/21 1736  ? ? ?PRN Meds:.labetalol, ondansetron  (ZOFRAN) IV ? ? ?Anti-infectives (From admission, onward)  ? ? None  ? ?  ? ?  ? ?Subjective: ?Katelyn [name] today has  No chest pain,   ?- ?Daughter Katelyn [name] at bedside  ?patient disappointed that she cannot have a EGD with dilatation today due to elevated INR ?-Tolerating liquid diet okay ?-Dysphagia with grits/oatmeal type consistency food persist ? ?Objective: ?Vitals:  ? 06/21/21 2143 06/22/21 0348 06/22/21 1300 06/22/21 2102  ?BP: 128/76 (!) 160/95 (!) 146/75 (!) 155/89  ?Pulse: 89 96 86 82  ?Resp: 18 18 18 18   ?Temp: 97.8 ?F (36.6 ?C) (!) 97.3 ?F (36.3 ?C) 98.1 ?F (36.7 ?C) 98.2 ?F (36.8 ?C)  ?TempSrc: Oral  Oral Oral  ?SpO2: 96% 92% 97% 97%  ?Weight:      ?Height:      ? ? ?Intake/Output Summary (Last 24 hours) at 06/23/2021 1210 ?Last data filed at 06/22/2021 2120 ?Gross per 24 hour  ?Intake 0 ml  ?Output 900 ml  ?Net -900 ml  ? ?Filed Weights  ? 06/20/21 1412 06/20/21 2300  ?Weight: 64 kg 64.6 kg  ? ? ?Physical Exam ? ?Gen:- Awake Alert,  in no apparent distress  ?HEENT:- [place].AT, No sclera icterus ?Neck-Supple Neck,No JVD,.  ?Lungs-  CTAB , fair symmetrical air movement ?CV- S1, S2 normal, regular  ?Abd-  +ve B.Sounds, Abd Soft, No tenderness,    ?Extremity/Skin:- No  edema, pedal pulses present  ?Psych-affect is appropriate, oriented x3 ?Neuro-generalized weakness, no new focal deficits, no tremors ? ?Data Reviewed: I have personally reviewed following labs and imaging studies ? ?CBC: ?Recent Labs  ?Lab 06/20/21 ?1535 06/21/21 ?4403 06/22/21 ?4742  ?WBC 18.1* 11.0* 12.2*  ?NEUTROABS 16.9*  --   --   ?HGB 12.7 12.4 13.0  ?HCT 39.9 40.4 41.8  ?MCV 91.3 92.4 91.5  ?PLT 163 150 139*  ? ?Basic Metabolic Panel: ?Recent Labs  ?Lab 06/20/21 ?1535 06/21/21 ?5956 06/22/21 ?3875  ?NA 138 139 140  ?K 4.9 4.1 4.7  ?CL 103 105 106  ?CO2 29 29 29   ?GLUCOSE 149* 78 94  ?BUN 41* 35* 28*  ?CREATININE 1.52* 1.39* 1.19*  ?CALCIUM 8.0* 7.8* 7.8*  ?MG  --  1.8  --   ?PHOS  --  3.3 3.1  ? ?GFR: ?Estimated Creatinine Clearance: 39.7  mL/min (A) (by C-G formula based on SCr of 1.19 mg/dL (H)). ?Liver Function Tests: ?Recent Labs  ?Lab 06/20/21 ?1535 06/21/21 ?6433 06/22/21 ?2951  ?AST 23 18  --   ?ALT 35 30  --   ?ALKPHOS 111 92  --   ?BILITOT 0.5 0.5  --   ?PROT 5.5* 5.1*  --   ?ALBUMIN 2.3* 2.2* 2.2*  ? ?Cardiac Enzymes: ?No results for input(s): CKTOTAL, CKMB, CKMBINDEX, TROPONINI in the last 168 hours. ?BNP (last 3 results) ?No results for input(s): PROBNP in the last 8760 hours. ?HbA1C: ?No results for input(s): HGBA1C in the last 72 hours. ?Sepsis Labs: ?@LABRCNTIP (procalcitonin:4,lacticidven:4) ?)No results found for this or any previous visit (from the past 240 hour(s)).  ? ? ?  Radiology Studies: ?No results found. ? ?inScheduled Meds: ? amiodarone  200 mg Oral Daily  ? feeding supplement  237 mL Oral BID BM  ? metoprolol succinate  25 mg Oral Daily  ? nystatin  5 mL Oral QID  ? pantoprazole  40 mg Oral BID  ? ?Continuous Infusions: ? sodium chloride 20 mL/hr at 06/22/21 1736  ? ? LOS: 2 days  ? ?Roxan Hockey M.D on 06/23/2021 at 12:10 PM ? ?Go to [email or web address] - for contact info ? ?[facility] - Office  [phone] ? ?If 7PM-7AM, please contact night-coverage ?[email or web address] ?Password [email or web address] ?06/23/2021, 12:10 PM  ? ? ?

## 2021-06-24 ENCOUNTER — Telehealth: Payer: Self-pay

## 2021-06-24 DIAGNOSIS — R1319 Other dysphagia: Secondary | ICD-10-CM | POA: Diagnosis not present

## 2021-06-24 DIAGNOSIS — R112 Nausea with vomiting, unspecified: Secondary | ICD-10-CM | POA: Diagnosis not present

## 2021-06-24 LAB — CBC
HCT: 43.4 % (ref 36.0–46.0)
Hemoglobin: 13.8 g/dL (ref 12.0–15.0)
MCH: 28.5 pg (ref 26.0–34.0)
MCHC: 31.8 g/dL (ref 30.0–36.0)
MCV: 89.7 fL (ref 80.0–100.0)
Platelets: 128 10*3/uL — ABNORMAL LOW (ref 150–400)
RBC: 4.84 MIL/uL (ref 3.87–5.11)
RDW: 15.1 % (ref 11.5–15.5)
WBC: 16.6 10*3/uL — ABNORMAL HIGH (ref 4.0–10.5)
nRBC: 0 % (ref 0.0–0.2)

## 2021-06-24 LAB — TYPE AND SCREEN
ABO/RH(D): B POS
Antibody Screen: NEGATIVE

## 2021-06-24 LAB — RENAL FUNCTION PANEL
Albumin: 2.3 g/dL — ABNORMAL LOW (ref 3.5–5.0)
Anion gap: 7 (ref 5–15)
BUN: 15 mg/dL (ref 8–23)
CO2: 27 mmol/L (ref 22–32)
Calcium: 7.6 mg/dL — ABNORMAL LOW (ref 8.9–10.3)
Chloride: 104 mmol/L (ref 98–111)
Creatinine, Ser: 0.92 mg/dL (ref 0.44–1.00)
GFR, Estimated: >60 mL/min (ref >60)
Glucose, Bld: 120 mg/dL — ABNORMAL HIGH (ref 70–99)
Phosphorus: 2.3 mg/dL — ABNORMAL LOW (ref 2.5–4.6)
Potassium: 3.9 mmol/L (ref 3.5–5.1)
Sodium: 138 mmol/L (ref 135–145)

## 2021-06-24 LAB — PROTIME-INR
INR: 1.7 — ABNORMAL HIGH (ref 0.8–1.2)
INR: 1.6 — ABNORMAL HIGH (ref 0.8–1.2)
Prothrombin Time: 19.7 seconds — ABNORMAL HIGH (ref 11.4–15.2)
Prothrombin Time: 19.3 seconds — ABNORMAL HIGH (ref 11.4–15.2)

## 2021-06-24 MED ORDER — SODIUM CHLORIDE 0.9% IV SOLUTION: Freq: Once | INTRAVENOUS | Status: AC

## 2021-06-24 NOTE — Telephone Encounter (Signed)
Lmom pt to not check w/home meter while hospitalized and to follow the instructions of their attending physician  ?

## 2021-06-24 NOTE — Telephone Encounter (Signed)
Pt called in and stated that they were in the hospital and wanted to report inr and I stated to follow the hospitals instructions on inr for now and she stated today they plan to stretch her esophagus through procedure. So I will route to kristin alvstad and chris pavero both rph.  ?

## 2021-06-24 NOTE — Progress Notes (Signed)
After FFP, INR still elevated at 1.6. We will have to hold off on EGD today as INR needs to be 1.5 or less. She can have full liquid diet today.  N.p.o. at midnight. Hopeful for EGD tomorrow. ? ? ?Aliene Altes, PA-C ?[facility] ? ?

## 2021-06-24 NOTE — Progress Notes (Signed)
?PROGRESS NOTE ? ? ? ? ?Katelyn [name], is a 81 y.o. female, DOB - [birth year], [id number] ? ?Admit date - 06/20/2021   Admitting Physician Katelyn Soloway Denton Brick, MD ? ?Outpatient Primary MD for the patient is Katelyn Squibb, MD ? ?LOS - 3 ? ?Chief Complaint  ?Patient presents with  ? Emesis  ?    ? ? ?Brief Narrative:  ?81 y.o. female with medical history significant of atrial fibrillation on warfarin, right upper lobe lung cancer status post right upper lobectomy with lymph node excision (08/15/2020), hypothyroidism, immunotherapy pneumonitis, GERD, HLD admitted on 06/20/2020 with dysphagia and intractable emesis in the setting of supratherapeutic INR ? ?  ?-Assessment and Plan: ?1)Esophageal Dysphagia with intractable emesis--GI consult appreciated ?Doing better with liquid diet however had significant difficulty with grits ?-Nystatin as ordered by GI team ?-Given persistent esophageal dysphagia plan is for EGD with dilatation  ?-INR is 1.6 to 1.7  today GI service would like to defer EGD with dilatation on  06/25/2021--- they would like INR to be below 1.5 prior to doing EGD with dilatation ? ?2) diarrhea and dehydration--doubt significant GI infection ?-Overall diarrhea has mostly resolved ?-may consider as needed Imodium ? ?3) chronic atrial fibrillation--- continue amiodarone and Toprol-XL for rate control ?-INR was supratherapeutic (INR > 10 on admission ) ?-Supratherapeutic INR is due to recent Levaquin use with cytochrome P450 interaction in the setting of poor oral intake with limited vitamin K ingestion ?INR trending down after vitamin K and FFP administration   ?-Hold Coumadin for now ? 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  ?5) hypothyroidism--- continue Synthroid ? ?6)HLD--continue Crestor ? ?7)Immunotherapy pneumonitis ?Patient was started on Levaquin and prednisone, she already completed  Levaquin ?Continue prednisone ? ?8) leukocytosis/thrombocytopenia --- may be reactive in the setting of intractable emesis or related to to steroid therapy induced demargination ?-Patient is being treated with Keytruda ?-No evidence of acute infection at this time continue to monitor ? ?9) generalized weakness and deconditioning----in the setting of poor oral intake due to dysphagia ?-Patient will benefit from home health PT/OT rehab ? 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  creatinine on admission= 1.52 (peak) ,  ?baseline creatinine =1.0    , ? creatinine is now= 0.9,  ?-AKI resolved with hydration ?renally adjust medications, avoid nephrotoxic agents / dehydration  / hypotension ? ? ?Disposition/Need for in-Hospital Stay- patient unable to be discharged at this time due to --dysphagia and intractable emesis requiring IV fluids and possible EGD with dilatation --unable to discharge pending tolerance of oral intake- ?-INR is 1.6 to 1.7  today GI service would like to defer EGD with dilatation on  06/25/2021--- they would like INR to be below 1.5 prior to doing EGD with dilatation ?-Possible discharge home in a couple of days if tolerating oral intake after EGD with dilatation ? ?Status is: Inpatient  ? ?Disposition: The patient is from: Home ?             Anticipated d/c is to: Home ?             Anticipated d/c date is: 2 days ?             Patient currently is not medically stable to d/c. ?Barriers: Not Clinically Stable-  ? ?Code Status : -  Code Status: Full Code  ? ?Family Communication:    (patient is alert, awake and coherent)  ?-Discussed with daughter Katelyn [name] at bedside ? ?DVT Prophylaxis  :   - SCDs   SCDs Start: 06/20/21 2036 ? ?Lab Results  ?Component Value Date  ? PLT 128 (L) 06/24/2021  ? ?Inpatient Medications ? ?Scheduled Meds: ? amiodarone  200 mg Oral Daily  ? feeding supplement  237 mL Oral  BID BM  ? metoprolol succinate  25 mg Oral Daily  ? nystatin  5 mL Oral QID  ? pantoprazole  40 mg Oral BID  ? ?Continuous Infusions: ? sodium chloride 20 mL/hr at 06/22/21 1736  ? ? ?PRN Meds:.labetalol, ondansetron (ZOFRAN) IV ? ? ?Anti-infectives (From admission, onward)  ? ? None  ? ?  ? ? Subjective: ?[name] today has  No chest pain,   ?- ?-Dysphagia for pudding consistency diet and solids ?-Awaiting EGD with dilatation ?-Patient is frustrated with prolonged wait for INR to drift down ? ?Objective: ?Vitals:  ? 06/24/21 1015 06/24/21 1032 06/24/21 1035 06/24/21 1118  ?BP: 122/82 110/70 110/70 117/74  ?Pulse: 95 83 83 83  ?Resp: 16 16 16 16   ?Temp: 98.2 ?F (36.8 ?C) 97.9 ?F (36.6 ?C) 97.9 ?F (36.6 ?C) 98.1 ?F (36.7 ?C)  ?TempSrc: Oral   Oral  ?SpO2: 96%   94%  ?Weight:      ?Height:      ? ? ?Intake/Output Summary (Last 24 hours) at 06/24/2021 1244 ?Last data filed at 06/24/2021 1118 ?Gross per 24 hour  ?Intake 1082 ml  ?Output --  ?Net 1082 ml  ? ?Filed Weights  ? 06/20/21 1412 06/20/21 2300  ?Weight: 64 kg 64.6 kg  ? ? ?Physical Exam ? ?Gen:- Awake Alert,  in no apparent distress  ?HEENT:- [place].AT, No sclera icterus ?Neck-Supple Neck,No JVD,.  ?Lungs-  CTAB , fair symmetrical air movement ?CV- S1, S2 normal, irregular  ?Abd-  +ve B.Sounds, Abd Soft, No tenderness,    ?Extremity/Skin:- No  edema, pedal pulses present  ?Psych-affect is appropriate, oriented x3 ?Neuro-generalized weakness, no new focal deficits, no tremors ? ?Data Reviewed: I have personally reviewed following labs and imaging studies ? ?CBC: ?Recent Labs  ?Lab 06/20/21 ?1535 06/21/21 ?8563 06/22/21 ?1497 06/24/21 ?0459  ?WBC 18.1* 11.0* 12.2* 16.6*  ?NEUTROABS 16.9*  --   --   --   ?HGB 12.7 12.4 13.0 13.8  ?HCT 39.9 40.4 41.8 43.4  ?MCV 91.3 92.4 91.5 89.7  ?PLT 163 150 139* 128*  ? ?Basic Metabolic Panel: ?Recent Labs  ?Lab 06/20/21 ?1535 06/21/21 ?0263 06/22/21 ?7858 06/24/21 ?0459  ?NA 138 139 140 138  ?K 4.9 4.1 4.7 3.9  ?CL 103 105 106  104  ?CO2 29 29 29 27   ?GLUCOSE 149* 78 94 120*  ?BUN 41* 35* 28* 15  ?CREATININE 1.52* 1.39* 1.19* 0.92  ?CALCIUM 8.0* 7.8* 7.8* 7.6*  ?MG  --  1.8  --   --   ?PHOS  --  3.3 3.1 2.3*  ? ?GFR: ?Estimated Creatinine Clearance: 51.4 mL/min (by C-G formula based on SCr of 0.92 mg/dL). ?Liver Function Tests: ?Recent Labs  ?Lab 06/20/21 ?1535 06/21/21 ?8502 06/22/21 ?7741 06/24/21 ?0459  ?AST 23 18  --   --   ?ALT 35 30  --   --   ?ALKPHOS 111 92  --   --   ?BILITOT 0.5 0.5  --   --   ?PROT 5.5* 5.1*  --   --   ?  ALBUMIN 2.3* 2.2* 2.2* 2.3*  ? ?Cardiac Enzymes: ?No results for input(s): CKTOTAL, CKMB, CKMBINDEX, TROPONINI in the last 168 hours. ?BNP (last 3 results) ?No results for input(s): PROBNP in the last 8760 hours. ?HbA1C: ?No results for input(s): HGBA1C in the last 72 hours. ?Sepsis Labs: ?@LABRCNTIP (procalcitonin:4,lacticidven:4) ?)No results found for this or any previous visit (from the past 240 hour(s)).  ? ? ?Radiology Studies: ?No results found. ? ?inScheduled Meds: ? amiodarone  200 mg Oral Daily  ? feeding supplement  237 mL Oral BID BM  ? metoprolol succinate  25 mg Oral Daily  ? nystatin  5 mL Oral QID  ? pantoprazole  40 mg Oral BID  ? ?Continuous Infusions: ? sodium chloride 20 mL/hr at 06/22/21 1736  ? ? LOS: 3 days  ? ?Roxan Hockey M.D on 06/24/2021 at 12:44 PM ? ?Go to [email or web address] - for contact info ? ?[facility] - Office  [phone] ? ?If 7PM-7AM, please contact night-coverage ?[email or web address] ?Password [email or web address] ?06/24/2021, 12:44 PM  ? ? ?

## 2021-06-24 NOTE — TOC Progression Note (Signed)
Transition of Care (TOC) - Progression Note  ? ? ?Patient Details  ?Name: JASHAE WIGGS ?MRN: [id number] ?Date of Birth: [birth year] ? ?Transition of Care (TOC) CM/SW Contact  ?Shade Flood, LCSW ?Phone Number: ?06/24/2021, 12:08 PM ? ?Clinical Narrative:    ? ?TOC following. MD anticipating dc home tomorrow. Plan remains for HH at dc. Referral given to [facility] at pt request. Tub bence order referred to [facility] at [place] who will follow up. ? ?Assigned TOC will follow up in AM. ? ?Expected Discharge Plan: [place] ?Barriers to Discharge: Continued Medical Work up ? ?Expected Discharge Plan and Services ?Expected Discharge Plan: [place] ?In-house Referral: Clinical Social Work ?  ?Post Acute Care Choice: Home Health, Durable Medical Equipment ?Living arrangements for the past 2 months: [place] ?                ?DME Arranged: Tub bench ?DME Agency: [employer] ?Date DME Agency Contacted: 06/24/21 ?  ?Representative spoke with at DME Agency: Caryl Pina ?HH Arranged: PT, OT, Nurse's Aide ?[place] Agency: [place] [facility] ?Date HH Agency Contacted: 06/24/21 ?  ?Representative spoke with at [place]: Vaughan Basta ? ? ?Social Determinants of Health (SDOH) Interventions ?  ? ?Readmission Risk Interventions ? ?  06/23/2021  ? 10:49 AM  ?Readmission Risk Prevention Plan  ?Transportation Screening Complete  ?Palliative Care Screening Not Applicable  ?Medication Review Press photographer) Complete  ? ? ?

## 2021-06-24 NOTE — Progress Notes (Signed)
? ? ?Subjective: ?Feeling well this morning. Reports she has been unable to advance her diet beyond pudding consistency. Prior to 1 week ago, she would have rare solid food dysphagia, but 1 week ago, symptoms acutely worsened and everything she ate was getting hung and coming back up.  ? ?Denies heartburn.  ? ?Denies abdominal pain. N/V resolved. Thinks this was related to items getting hung in her esophagus. Diarrhea also resolved. No brbpr or melena.  ? ?Objective: ?Vital signs in last 24 hours: ?Temp:  [98.1 ?F (36.7 ?C)-98.3 ?F (36.8 ?C)] 98.1 ?F (36.7 ?C) (04/25 0502) ?Pulse Rate:  [80-99] 99 (04/25 0502) ?Resp:  [18-19] 19 (04/25 0502) ?BP: (144-163)/(72-96) 144/94 (04/25 0502) ?SpO2:  [94 %-97 %] 94 % (04/25 0502) ?Last BM Date : 06/24/21 ?General:   Alert and oriented, pleasant ?Head:  Normocephalic and atraumatic. ?Eyes:  No icterus, sclera clear. Conjuctiva pink.  ?Abdomen:  Bowel sounds present, soft, non-tender, non-distended. No HSM or hernias noted. No rebound or guarding. No masses appreciated  ?Msk:  Symmetrical without gross deformities. Normal posture. ?Extremities:  Without edema. ?Neurologic:  Alert and  oriented x4;  grossly normal neurologically. ?Skin:  Warm and dry, intact without significant lesions.  ?Psych:  Normal mood and affect. ? ?Intake/Output from previous day: ?04/24 0701 - 04/25 0700 ?In: 960 [P.O.:960] ?Out: -  ?Intake/Output this shift: ?No intake/output data recorded. ? ?Lab Results: ?Recent Labs  ?  06/22/21 ?0626 06/24/21 ?9509  ?WBC 12.2* 16.6*  ?HGB 13.0 13.8  ?HCT 41.8 43.4  ?PLT 139* 128*  ? ?BMET ?Recent Labs  ?  06/22/21 ?0626 06/24/21 ?3267  ?NA 140 138  ?K 4.7 3.9  ?CL 106 104  ?CO2 29 27  ?GLUCOSE 94 120*  ?BUN 28* 15  ?CREATININE 1.19* 0.92  ?CALCIUM 7.8* 7.6*  ? ?LFT ?Recent Labs  ?  06/22/21 ?0626 06/24/21 ?1245  ?ALBUMIN 2.2* 2.3*  ? ?PT/INR ?Recent Labs  ?  06/23/21 ?0455 06/24/21 ?0459  ?LABPROT 20.8* 19.7*  ?INR 1.8* 1.7*  ? ? ? ?Assessment: ?81 y.o. female  with a history of large cell carcinoma of right lung status post right upper lobectomy in June 2022, history of atrial fibrillation on warfarin,  remote CVA, hypothyroidism, and history of pneumonitis recently secondary to immunotherapy s/p course of Levaquin, presenting with N/V, diarrhea, and dysphagia. Noted to also have supratherapeutic INR.  ? ?Dysphagia:  ?Acute on chronic dysphagia. Previously with rare solid food dysphagia. Acute worsening 1 week ago with all foods getting hung in esophagus with associated regurgitation. Hasn't been able to advance her diet beyond pudding consistency. She had EGD in 2019 and responded well to empiric dilation. Empirically on Nystatin this admission due to presumed Candida in the setting of recent antibiotics and prednisone.  ? ?We have been waiting on INR to reach 1.5 or less to proceed with EGD/dilation. INR is still elevated at 1.7 today. Case discussed with Dr. Denton Brick and Dr. Jenetta Downer. Dr. Denton Brick will administer FFP today in efforts to improve INR. Will need to recheck INR thereafter, and if 1.5 or less, will proceed with EGD today.  ? ?Diarrhea:  ?Resolved. Possible antibiotic related.  ? ?N/V:  ?Resolved. Likely related to dysphagia requiring regurgitation as per above.  ? ?Plan: ?Keep NPO ?FFP per Dr. Denton Brick ?INR STAT after FFP administered ?Will proceed with EGD +/- dilation with propofol with Dr. Jenetta Downer today if INR is 1.5 or less. The risks, benefits, and alternatives have been discussed with the patient in detail.  The patient states understanding and desires to proceed.  ?Continue PPI BID ?Continue nystatin ? ? ? LOS: 3 days  ? ? 06/24/2021, 9:02 AM ? ? ?Aliene Altes, PA-C ?[facility]  ?

## 2021-06-24 NOTE — Progress Notes (Signed)
Patient alert, oriented and minimal assist. She was given 1 unit FFP but INR still at 1.6 so plan for EGD tomorrow. Patient aware she will be NPO after midnight.  ?

## 2021-06-25 DIAGNOSIS — R791 Abnormal coagulation profile: Secondary | ICD-10-CM | POA: Diagnosis not present

## 2021-06-25 DIAGNOSIS — K219 Gastro-esophageal reflux disease without esophagitis: Secondary | ICD-10-CM | POA: Diagnosis not present

## 2021-06-25 DIAGNOSIS — R1319 Other dysphagia: Secondary | ICD-10-CM | POA: Diagnosis not present

## 2021-06-25 DIAGNOSIS — R112 Nausea with vomiting, unspecified: Secondary | ICD-10-CM | POA: Diagnosis not present

## 2021-06-25 LAB — PREPARE FRESH FROZEN PLASMA

## 2021-06-25 LAB — RENAL FUNCTION PANEL
Albumin: 2.2 g/dL — ABNORMAL LOW (ref 3.5–5.0)
Anion gap: 5 (ref 5–15)
BUN: 13 mg/dL (ref 8–23)
CO2: 30 mmol/L (ref 22–32)
Calcium: 7.8 mg/dL — ABNORMAL LOW (ref 8.9–10.3)
Chloride: 104 mmol/L (ref 98–111)
Creatinine, Ser: 0.99 mg/dL (ref 0.44–1.00)
GFR, Estimated: 58 mL/min — ABNORMAL LOW (ref >60)
Glucose, Bld: 102 mg/dL — ABNORMAL HIGH (ref 70–99)
Phosphorus: 2.5 mg/dL (ref 2.5–4.6)
Potassium: 4 mmol/L (ref 3.5–5.1)
Sodium: 139 mmol/L (ref 135–145)

## 2021-06-25 LAB — CBC
HCT: 39 % (ref 36.0–46.0)
Hemoglobin: 12.4 g/dL (ref 12.0–15.0)
MCH: 29.2 pg (ref 26.0–34.0)
MCHC: 31.8 g/dL (ref 30.0–36.0)
MCV: 91.8 fL (ref 80.0–100.0)
Platelets: 111 10*3/uL — ABNORMAL LOW (ref 150–400)
RBC: 4.25 MIL/uL (ref 3.87–5.11)
RDW: 15.6 % — ABNORMAL HIGH (ref 11.5–15.5)
WBC: 14.1 10*3/uL — ABNORMAL HIGH (ref 4.0–10.5)
nRBC: 0 % (ref 0.0–0.2)

## 2021-06-25 LAB — BPAM FFP
Blood Product Expiration Date: 202304302359
ISSUE DATE / TIME: 202304251012
Unit Type and Rh: 7300

## 2021-06-25 LAB — PROTIME-INR
INR: 1.7 — ABNORMAL HIGH (ref 0.8–1.2)
Prothrombin Time: 19.7 seconds — ABNORMAL HIGH (ref 11.4–15.2)

## 2021-06-25 MED ORDER — PREDNISONE 20 MG PO TABS
20.0000 mg | ORAL_TABLET | Freq: Every day | ORAL | Status: DC
  Administered 2021-06-25 – 2021-06-27 (×2): 20 mg via ORAL
  Filled 2021-06-25 (×3): qty 1

## 2021-06-25 NOTE — Progress Notes (Signed)
?PROGRESS NOTE ? ? Katelyn [name]  FBP:[id number] DOB: 04/04/1940 DOA: 06/20/2021 ?PCP: Celene Squibb, MD  ? ?Chief Complaint  ?Patient presents with  ? Emesis  ? ?Level of care: Med-Surg ? ?Brief Admission History:  ?81 y.o. female with medical history significant of atrial fibrillation on warfarin, right upper lobe lung cancer status post right upper lobectomy with lymph node excision (08/15/2020), hypothyroidism, immunotherapy pneumonitis, GERD, HLD admitted on 06/20/2020 with dysphagia and intractable emesis in the setting of supratherapeutic INR ?  ?Assessment and Plan: ?1)Esophageal Dysphagia with intractable emesis--GI consult appreciated ?Doing better with liquid diet however had significant difficulty with grits ?-Nystatin as ordered by GI team ?-Given persistent esophageal dysphagia plan is for EGD with dilatation  ?-INR is 1.6 to 1.7  today GI service would like to defer EGD with dilatation--- they would like INR to be below 1.5 prior to doing EGD with dilatation ?-INR 1.7 today.  See GI notes and recommendations.   ?  ?2) diarrhea and dehydration--doubt significant GI infection ?-Overall diarrhea has mostly resolved ?-may consider as needed Imodium ?  ?3) chronic atrial fibrillation--- continue amiodarone and Toprol-XL for rate control ?-INR was supratherapeutic (INR > 10 on admission ) ?-Supratherapeutic INR is due to recent Levaquin use with cytochrome P450 interaction in the setting of poor oral intake with limited vitamin K ingestion ?INR trending down after vitamin K and FFP administration   ?-Hold Coumadin for now ?  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 Delton Coombes, she continues to be on chemotherapy with Keytruda ?  ?5) hypothyroidism--- continue Synthroid ?  ?6)HLD--continue Crestor ?  ?7)Immunotherapy pneumonitis ?Patient was started on Levaquin and prednisone, she already completed Levaquin ?Continue prednisone ?  ?8)  leukocytosis/thrombocytopenia --- may be reactive in the setting of intractable emesis or related to to steroid therapy induced demargination ?-Patient is being treated with Keytruda ?-No evidence of acute infection at this time continue to monitor ?  ?9) generalized weakness and deconditioning----in the setting of poor oral intake due to dysphagia ?-Patient will benefit from home health PT/OT rehab ?  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  creatinine on admission= 1.52 (peak) ,  ?baseline creatinine =1.0    , ? creatinine is now= 0.9,  ?-AKI resolved with hydration ?renally adjust medications, avoid nephrotoxic agents / dehydration  / hypotension ?  ?  ?Disposition/Need for in-Hospital Stay- patient unable to be discharged at this time due to --dysphagia and intractable emesis requiring IV fluids and possible EGD with dilatation --unable to discharge pending tolerance of oral intake- ?-INR is 1.6 to 1.7  today GI service would like to defer EGD with dilatation on  06/25/2021--- they would like INR to be below 1.5 prior to doing EGD with dilatation ?-Possible discharge home in a couple of days if tolerating oral intake after EGD with dilatation ? ?DVT prophylaxis: SCDs ?Code Status: Full  ?Family Communication:  ?Disposition: Status is: Inpatient ?Remains inpatient appropriate because: awaiting INR<1.5 for EGD/dilatation ?  ?Consultants:  ?GI service  ?Procedures:  ?Tentative EGD/dilatation pending  ?Antimicrobials:  ?  ?Subjective: ?Pt reports she feels just fine today.   ?Objective: ?Vitals:  ? 06/24/21 2156 06/25/21 0410 06/25/21 1101 06/25/21 1244  ?BP: 130/74 130/80 (!) 146/76 118/81  ?Pulse: 92 100 84 (!) 102  ?Resp: 18 20  18   ?Temp: 97.6 ?F (36.4 ?C)  98.6 ?F (37 ?C)  98.3 ?F (36.8 ?C)  ?TempSrc:  Oral  Oral  ?SpO2: 93% 94%  96%  ?Weight:      ?Height:      ? ? ?Intake/Output Summary (Last 24 hours) at  06/25/2021 1426 ?Last data filed at 06/25/2021 0940 ?Gross per 24 hour  ?Intake 961.09 ml  ?Output --  ?Net 961.09 ml  ? ?Filed Weights  ? 06/20/21 1412 06/20/21 2300  ?Weight: 64 kg 64.6 kg  ? ?Examination: ? ?General exam: Appears calm and comfortable.   ?Respiratory system: Clear to auscultation. Respiratory effort normal. ?Cardiovascular system: normal S1 & S2 heard. No JVD, murmurs, rubs, gallops or clicks. No pedal edema. ?Gastrointestinal system: Abdomen is nondistended, soft and nontender. No organomegaly or masses felt. Normal bowel sounds heard. ?Central nervous system: Alert and oriented. No focal neurological deficits. ?Extremities: Symmetric 5 x 5 power. ?Skin: No rashes, lesions or ulcers. ?Psychiatry: Judgement and insight appear normal. Mood & affect appropriate.  ? ?Data Reviewed: I have personally reviewed following labs and imaging studies ? ?CBC: ?Recent Labs  ?Lab 06/20/21 ?1535 06/21/21 ?9604 06/22/21 ?5409 06/24/21 ?8119 06/25/21 ?1478  ?WBC 18.1* 11.0* 12.2* 16.6* 14.1*  ?NEUTROABS 16.9*  --   --   --   --   ?HGB 12.7 12.4 13.0 13.8 12.4  ?HCT 39.9 40.4 41.8 43.4 39.0  ?MCV 91.3 92.4 91.5 89.7 91.8  ?PLT 163 150 139* 128* 111*  ? ? ?Basic Metabolic Panel: ?Recent Labs  ?Lab 06/20/21 ?1535 06/21/21 ?2956 06/22/21 ?2130 06/24/21 ?8657 06/25/21 ?8469  ?NA 138 139 140 138 139  ?K 4.9 4.1 4.7 3.9 4.0  ?CL 103 105 106 104 104  ?CO2 29 29 29 27 30   ?GLUCOSE 149* 78 94 120* 102*  ?BUN 41* 35* 28* 15 13  ?CREATININE 1.52* 1.39* 1.19* 0.92 0.99  ?CALCIUM 8.0* 7.8* 7.8* 7.6* 7.8*  ?MG  --  1.8  --   --   --   ?PHOS  --  3.3 3.1 2.3* 2.5  ? ? ?CBG: ?No results for input(s): GLUCAP in the last 168 hours. ? ?No results found for this or any previous visit (from the past 240 hour(s)).  ? ?Radiology Studies: ?No results found. ? ?Scheduled Meds: ? amiodarone  200 mg Oral Daily  ? feeding supplement  237 mL Oral BID BM  ? metoprolol succinate  25 mg Oral Daily  ? nystatin  5 mL Oral QID  ? pantoprazole  40 mg  Oral BID  ? predniSONE  20 mg Oral Q breakfast  ? ?Continuous Infusions: ? sodium chloride Stopped (06/22/21 1741)  ? ? ? LOS: 4 days  ? ?Time spent: 36 mins ? ?Irwin Brakeman, MD ?How to contact the [facility] Attending or Consulting provider [place] or covering provider during after hours [place], for this patient?  ?Check the care team in [facility] and look for a) attending/consulting [facility] provider listed and b) the [facility] team listed ?Log into [email or web address] and use [facility] universal password to access. If you do not have the password, please contact the hospital operator. ?Locate the [facility] provider you are looking for under [facility] and page to a number that you can be directly reached. ?If you still have difficulty reaching the provider, please page the [facility] (Director on Call) for the Hospitalists listed on amion for assistance. ? ?06/25/2021, 2:26 PM  ? ? ?

## 2021-06-25 NOTE — Progress Notes (Signed)
?  Subjective: ?Patient doing okay today. Reports she is tired. Denies any pain, nausea or vomiting. Last BM was yesterday, very small in volume. Denies rectal bleeding or melena. Tolerating liquid diet without issue. Denies issues with heartburn or acid reflux. Reports she is hungry.  ? ?Objective: ?Vital signs in last 24 hours: ?Temp:  [97.6 ?F (36.4 ?C)-98.6 ?F (37 ?C)] 98.6 ?F (37 ?C) (04/26 0410) ?Pulse Rate:  [83-100] 100 (04/26 0410) ?Resp:  [16-20] 20 (04/26 0410) ?BP: (110-130)/(70-83) 130/80 (04/26 0410) ?SpO2:  [93 %-96 %] 94 % (04/26 0410) ?Last BM Date : 06/24/21 ?General:   Alert and oriented, pleasant ?Head:  Normocephalic and atraumatic. ?Eyes:  No icterus, sclera clear. Conjuctiva pink.  ?Mouth:  Without lesions, mucosa pink and moist.  ?Neck:  Supple, without thyromegaly or masses.  ?Heart:  S1, S2 present, no murmurs noted.  ?Lungs: Clear to auscultation bilaterally, without wheezing, rales, or rhonchi.  ?Abdomen:  Bowel sounds present, soft, non-tender, non-distended. No HSM or hernias noted. No rebound or guarding. No masses appreciated  ?Msk:  Symmetrical without gross deformities. Normal posture. ?Pulses:  Normal pulses noted. ?Extremities:  Without clubbing or edema. ?Neurologic:  Alert and  oriented x4;  grossly normal neurologically. ?Skin:  Warm and dry, intact without significant lesions.  ?Psych:  Alert and cooperative. Normal mood and affect. ? ?Intake/Output from previous day: ?04/25 0701 - 04/26 0700 ?In: 2043.1 [P.O.:1920; I.V.:1.1; Blood:122] ?Out: -  ? ?Lab Results: ?Recent Labs  ?  06/24/21 ?8841 06/25/21 ?6606  ?WBC 16.6* 14.1*  ?HGB 13.8 12.4  ?HCT 43.4 39.0  ?PLT 128* 111*  ? ?BMET ?Recent Labs  ?  06/24/21 ?0459 06/25/21 ?0452  ?NA 138 139  ?K 3.9 4.0  ?CL 104 104  ?CO2 27 30  ?GLUCOSE 120* 102*  ?BUN 15 13  ?CREATININE 0.92 0.99  ?CALCIUM 7.6* 7.8*  ? ?LFT ?Recent Labs  ?  06/24/21 ?0459 06/25/21 ?0452  ?ALBUMIN 2.3* 2.2*  ? ?PT/INR ?Recent Labs  ?  06/24/21 ?1143  06/25/21 ?0452  ?LABPROT 19.3* 19.7*  ?INR 1.6* 1.7*  ? ? ?Assessment: ?Katelyn [name] is a 81 year old female with history of large cell carcinoma of R lung, s/p R upper lobectromy in June 2022, hx of A fib on coumadin, remote CVA, hypothyroidism, hx of pneumonitis recently secondary to immunotherapy s/p course of Levaquin, who presented with N/v/d and dysphagia, also noted to have supratherapeutic INR. ? ?N/V/D: resolved. ? ?Dysphagia: acute on chronic, previously w/solid foods, occurring rarely, worsened 1 week ago with all foods feeling stuck in esophagus with associated regurgitation. Unable to advance diet past pudding consistency foods. EGD in 2019 w/empiric dilation. Denies episodes of heartburn. On empiric nystatin this admission for suspected Candida in setting of recent antibiotics and prednisone use. Tolerating liquid diet without issue ? ?EGD yesterday postponed due to INR of 1.7. patient received FFP yesterday, with slight decrease to 1.6, back up to 1.7 today. Plan for EGD once INR <1.6, consider barium esophagram and esophageal manometry if dysphagia persists and EGD unable to be performed in timely manner.  ? ?Plan: ?EGD + dilation once INR <1.6 ?Contiue PPI BID ?Continue nystatin suspension ?Can resume liquid diet for now ?Consider barium esophogram/esophageal manometry if unable to complete EGD ?Continue with supportive measures ? ? LOS: 4 days  ? ? 06/25/2021, 9:43 AM ? ? ?Katelyn [name] L. Alver Sorrow, MSN, APRN, AGNP-C ?Adult-Gerontology Nurse Practitioner ?[facility] ? ?

## 2021-06-25 NOTE — Progress Notes (Signed)
Pt alert and oriented x 4. No prns given. Assessed chart for consent for EGD today. No consent on chart. No order to obtain consent in order history.  ?

## 2021-06-26 ENCOUNTER — Inpatient Hospital Stay (HOSPITAL_COMMUNITY): Payer: Medicare Other

## 2021-06-26 ENCOUNTER — Inpatient Hospital Stay (HOSPITAL_COMMUNITY): Payer: Medicare Other | Admitting: Anesthesiology

## 2021-06-26 ENCOUNTER — Other Ambulatory Visit: Payer: Self-pay

## 2021-06-26 ENCOUNTER — Encounter (HOSPITAL_COMMUNITY): Payer: Self-pay | Admitting: Family Medicine

## 2021-06-26 ENCOUNTER — Encounter (HOSPITAL_COMMUNITY)
Admission: EM | Disposition: A | Discharge status: Home Health | Payer: Self-pay | Source: Home / Self Care | Attending: Family Medicine

## 2021-06-26 ENCOUNTER — Inpatient Hospital Stay (HOSPITAL_COMMUNITY): Payer: Medicare Other | Admitting: Hematology

## 2021-06-26 DIAGNOSIS — R791 Abnormal coagulation profile: Secondary | ICD-10-CM | POA: Diagnosis not present

## 2021-06-26 DIAGNOSIS — K219 Gastro-esophageal reflux disease without esophagitis: Secondary | ICD-10-CM | POA: Diagnosis not present

## 2021-06-26 DIAGNOSIS — R1319 Other dysphagia: Secondary | ICD-10-CM | POA: Diagnosis not present

## 2021-06-26 DIAGNOSIS — R112 Nausea with vomiting, unspecified: Secondary | ICD-10-CM | POA: Diagnosis not present

## 2021-06-26 DIAGNOSIS — R1310 Dysphagia, unspecified: Secondary | ICD-10-CM

## 2021-06-26 HISTORY — PX: ESOPHAGEAL DILATION: SHX303

## 2021-06-26 HISTORY — PX: ESOPHAGOGASTRODUODENOSCOPY (EGD) WITH PROPOFOL: SHX5813

## 2021-06-26 LAB — BASIC METABOLIC PANEL
Anion gap: 6 (ref 5–15)
BUN: 17 mg/dL (ref 8–23)
CO2: 27 mmol/L (ref 22–32)
Calcium: 7.8 mg/dL — ABNORMAL LOW (ref 8.9–10.3)
Chloride: 106 mmol/L (ref 98–111)
Creatinine, Ser: 0.96 mg/dL (ref 0.44–1.00)
GFR, Estimated: >60 mL/min (ref >60)
Glucose, Bld: 127 mg/dL — ABNORMAL HIGH (ref 70–99)
Potassium: 3.7 mmol/L (ref 3.5–5.1)
Sodium: 139 mmol/L (ref 135–145)

## 2021-06-26 LAB — CBC
HCT: 38.4 % (ref 36.0–46.0)
Hemoglobin: 12.2 g/dL (ref 12.0–15.0)
MCH: 28.9 pg (ref 26.0–34.0)
MCHC: 31.8 g/dL (ref 30.0–36.0)
MCV: 91 fL (ref 80.0–100.0)
Platelets: 110 10*3/uL — ABNORMAL LOW (ref 150–400)
RBC: 4.22 MIL/uL (ref 3.87–5.11)
RDW: 15.9 % — ABNORMAL HIGH (ref 11.5–15.5)
WBC: 15.3 10*3/uL — ABNORMAL HIGH (ref 4.0–10.5)
nRBC: 0 % (ref 0.0–0.2)

## 2021-06-26 LAB — PROTIME-INR
INR: 1.5 — ABNORMAL HIGH (ref 0.8–1.2)
Prothrombin Time: 18.3 seconds — ABNORMAL HIGH (ref 11.4–15.2)

## 2021-06-26 SURGERY — ESOPHAGOGASTRODUODENOSCOPY (EGD) WITH PROPOFOL
Anesthesia: General

## 2021-06-26 MED ORDER — WARFARIN - PHYSICIAN DOSING INPATIENT
Freq: Every day | Status: DC
Start: 2021-06-27 — End: 2021-06-27

## 2021-06-26 MED ORDER — WARFARIN SODIUM 2.5 MG PO TABS
2.5000 mg | ORAL_TABLET | Freq: Once | ORAL | Status: AC
  Administered 2021-06-26: 2.5 mg via ORAL
  Filled 2021-06-26: qty 1

## 2021-06-26 MED ORDER — LACTATED RINGERS IV SOLN: INTRAVENOUS | Status: DC

## 2021-06-26 MED ORDER — PROPOFOL 10 MG/ML IV BOLUS
INTRAVENOUS | Status: DC | PRN
  Administered 2021-06-26: 20 mg via INTRAVENOUS
  Administered 2021-06-26: 50 mg via INTRAVENOUS
  Administered 2021-06-26: 80 mg via INTRAVENOUS

## 2021-06-26 MED ORDER — PHENYLEPHRINE HCL (PRESSORS) 10 MG/ML IV SOLN
INTRAVENOUS | Status: DC | PRN
  Administered 2021-06-26 (×3): 80 ug via INTRAVENOUS

## 2021-06-26 NOTE — Op Note (Signed)
[facility] ?Patient Name: Katelyn [name] ?Procedure Date: 06/26/2021 2:42 PM ?MRN: [id number] ?Date of Birth: [birth year] ?Attending MD: Norvel Richards , MD ?CSN: [id number] ?Age: 81 ?Admit Type: Outpatient ?Procedure:                Upper GI endoscopy ?Indications:              Dysphagia ?Providers:                Norvel Richards, MD, Gwynneth Albright RN,  ?                          RN, Aram Candela ?Referring MD:              ?Medicines:                Propofol per Anesthesia ?Complications:            No immediate complications. ?Estimated Blood Loss:     Estimated blood loss was minimal. ?Procedure:                Pre-Anesthesia Assessment: ?                          - Prior to the procedure, a History and Physical  ?                          was performed, and patient medications and  ?                          allergies were reviewed. The patient's tolerance of  ?                          previous anesthesia was also reviewed. The risks  ?                          and benefits of the procedure and the sedation  ?                          options and risks were discussed with the patient.  ?                          All questions were answered, and informed consent  ?                          was obtained. Prior Anticoagulants: The patient has  ?                          taken no previous anticoagulant or antiplatelet  ?                          agents. ASA Grade Assessment: III - A patient with  ?                          severe systemic disease. After reviewing the risks  ?  and benefits, the patient was deemed in  ?                          satisfactory condition to undergo the procedure. ?                          After obtaining informed consent, the endoscope was  ?                          passed under direct vision. Throughout the  ?                          procedure, the patient's blood pressure, pulse, and  ?                          oxygen saturations were  monitored continuously. The  ?                          GIF-H190 ([id number]) scope was introduced through the  ?                          mouth, and advanced to the second part of duodenum.  ?                          The upper GI endoscopy was accomplished without  ?                          difficulty. The patient tolerated the procedure  ?                          well. ?Scope In: 3:53:19 PM ?Scope Out: 3:58:48 PM ?Total Procedure Duration: 0 hours 5 minutes 29 seconds  ?Findings: ?     The examined esophagus was normal. Stomach empty. Normal appearing  ?     gastric mucosa. Patent pylorus. Normal D1 and D2 The scope was  ?     withdrawn. Dilation was performed with a Maloney dilator with no  ?     resistance at 54 Fr. Dilation was performed with a Maloney dilator with  ?     mild resistance at 56 Fr. The dilation site was examined following  ?     endoscope reinsertion and showed no change. Estimated blood loss was  ?     minimal. ?Impression:               - Normal esophagus. Dilated. Normal EGD. ?                          - No specimens collected. ?Moderate Sedation: ?     Moderate (conscious) sedation was personally administered by an  ?     anesthesia professional. The following parameters were monitored: oxygen  ?     saturation, heart rate, blood pressure, respiratory rate, EKG, adequacy  ?     of pulmonary ventilation, and response to care. ?Recommendation:           - Patient has a contact number available for  ?  emergencies. The signs and symptoms of potential  ?                          delayed complications were discussed with the  ?                          patient. Return to normal activities tomorrow.  ?                          Written discharge instructions were provided to the  ?                          patient. ?                          - Advance diet as tolerated. ?                          - Continue present medications. Daily PPI. From a  ?                           GI standpoint, may resume Coumadin today. ?                          - Return to my office in 6 months. ?Procedure Code(s):        --- Professional --- ?                          [phone], Esophagogastroduodenoscopy, flexible,  ?                          transoral; diagnostic, including collection of  ?                          specimen(s) by brushing or washing, when performed  ?                          (separate procedure) ?                          43450, Dilation of esophagus, by unguided sound or  ?                          bougie, single or multiple passes ?Diagnosis Code(s):        --- Professional --- ?                          R13.10, Dysphagia, unspecified ?CPT copyright 2019 American Medical Association. All rights reserved. ?The codes documented in this report are preliminary and upon coder review may  ?be revised to meet current compliance requirements. ?Cristopher Estimable. Reina Wilton, MD ?Norvel Richards, MD ?06/26/2021 4:08:22 PM ?This report has been signed electronically. ?Number of Addenda: 0 ?

## 2021-06-26 NOTE — Anesthesia Preprocedure Evaluation (Signed)
Anesthesia Evaluation  ?Patient identified by MRN, date of birth, ID band ?Patient awake ? ? ? ?Reviewed: ?Allergy & Precautions, NPO status , Patient's Chart, lab work & pertinent test results, reviewed documented beta blocker date and time  ? ?Airway ?Mallampati: II ? ?TM Distance: >3 FB ?Neck ROM: Full ? ? ? Dental ? ?(+) Dental Advisory Given, Missing ?  ?Pulmonary ?asthma , COPD, former smoker,  ?Right lung cancer, Upper right lobectomy ?  ?breath sounds clear to auscultation ? ? ? ? ? ? Cardiovascular ?hypertension, Pt. on medications and Pt. on home beta blockers ?+ CAD and + Peripheral Vascular Disease  ?+ dysrhythmias + Valvular Problems/Murmurs (TAVR)  ?Rhythm:Regular Rate:Normal ?+ Systolic murmurs ? ?  ?Neuro/Psych ? Neuromuscular disease CVA negative psych ROS  ? GI/Hepatic ?Neg liver ROS, GERD  Medicated,  ?Endo/Other  ?Hypothyroidism  ? Renal/GU ?Renal InsufficiencyRenal disease  ?negative genitourinary ?  ?Musculoskeletal ? ?(+) Arthritis , Osteoarthritis,   ? Abdominal ?  ?Peds ?negative pediatric ROS ?(+)  Hematology ?negative hematology ROS ?(+)   ?Anesthesia Other Findings ? ? Reproductive/Obstetrics ?negative OB ROS ? ?  ? ? ? ? ? ? ? ? ? ? ? ? ? ?  ?  ? ? ? ? ? ? ? ? ?Anesthesia Physical ?Anesthesia Plan ? ?ASA: 3 ? ?Anesthesia Plan: General  ? ?Post-op Pain Management: Minimal or no pain anticipated  ? ?Induction:  ? ?PONV Risk Score and Plan:  ? ?Airway Management Planned:  ? ?Additional Equipment:  ? ?Intra-op Plan:  ? ?Post-operative Plan:  ? ?Informed Consent: I have reviewed the patients History and Physical, chart, labs and discussed the procedure including the risks, benefits and alternatives for the proposed anesthesia with the patient or authorized representative who has indicated his/her understanding and acceptance.  ? ? ? ?Dental advisory given ? ?Plan Discussed with: CRNA and Surgeon ? ?Anesthesia Plan Comments:   ? ? ? ? ? ? ?Anesthesia Quick  Evaluation ? ?

## 2021-06-26 NOTE — H&P (Signed)
?  Patient seen and examined in short stay.  INR down to 1.5.   hemoglobin 12.2. ?  Patient states prior empiric dilation was associated with durable improvement in dysphagia symptoms until recently. ?  I agree with need for repeat EGD with dilation as feasible/appropriate.  The risk, benefits, limitations, alternatives have been reviewed with the patient.  Questions have been answered.  She is agreeable. ? ?

## 2021-06-26 NOTE — Progress Notes (Signed)
Per on call hold prednisone for now pt is NPO ?

## 2021-06-26 NOTE — Anesthesia Postprocedure Evaluation (Signed)
Anesthesia Post Note ? ?Patient: Katelyn [name] ? ?Procedure(s) Performed: ESOPHAGOGASTRODUODENOSCOPY (EGD) WITH PROPOFOL ?ESOPHAGEAL DILATION ? ?Patient location during evaluation: PACU ?Anesthesia Type: General ?Level of consciousness: awake and alert and oriented ?Pain management: pain level controlled ?Vital Signs Assessment: post-procedure vital signs reviewed and stable ?Respiratory status: spontaneous breathing, nonlabored ventilation and respiratory function stable ?Cardiovascular status: blood pressure returned to baseline and stable ?Postop Assessment: no apparent nausea or vomiting ?Anesthetic complications: no ? ? ?No notable events documented. ? ? ?Last Vitals:  ?Vitals:  ? 06/26/21 1620 06/26/21 1628  ?BP: 103/75 102/66  ?Pulse: 86 97  ?Resp: (!) 22 20  ?Temp:  36.5 ?C  ?SpO2: 98% 97%  ?  ?Last Pain:  ?Vitals:  ? 06/26/21 1628  ?TempSrc:   ?PainSc: 0-No pain  ? ? ?  ?  ?  ?  ?  ?  ? ?[place] Nery C Danylle Ouk ? ? ? ? ?

## 2021-06-26 NOTE — Transfer of Care (Signed)
Immediate Anesthesia Transfer of Care Note ? ?Patient: Katelyn [name] ? ?Procedure(s) Performed: ESOPHAGOGASTRODUODENOSCOPY (EGD) WITH PROPOFOL ?ESOPHAGEAL DILATION ? ?Patient Location: PACU ? ?Anesthesia Type:General ? ?Level of Consciousness: awake, alert , sedated, drowsy and patient cooperative ? ?Airway & Oxygen Therapy: Patient Spontanous Breathing and Patient connected to nasal cannula oxygen ? ?Post-op Assessment: Post -op Vital signs reviewed and stable ? ?Post vital signs: Reviewed and stable ? ?Last Vitals:  ?Vitals Value Taken Time  ?BP 103/75 06/26/21 1620  ?Temp 97.5 06/26/21 1610  ?Pulse 92 06/26/21 1621  ?Resp 23 06/26/21 1621  ?SpO2 99 % 06/26/21 1621  ?Vitals shown include unvalidated device data. ? ?Last Pain:  ?Vitals:  ? 06/26/21 1552  ?TempSrc:   ?PainSc: 0-No pain  ?   ? ?  ? ?Complications: No notable events documented. ?

## 2021-06-26 NOTE — Progress Notes (Signed)
Pt lying in bed looking in the direction of the tv. Pt is able to make needs known. Denies pain or discomfort at this time. Pt was assisted to the bathroom. Had steady gait with front wheel walker. Will continue to monitor. Call bell in reach. Bed in lowest positron and pt has on grip socks ?

## 2021-06-26 NOTE — Progress Notes (Signed)
Pt off unit

## 2021-06-26 NOTE — Progress Notes (Signed)
?PROGRESS NOTE ? ? Katelyn [name]  LEX:[id number] DOB: 25-Nov-1940 DOA: 06/20/2021 ?PCP: Celene Squibb, MD  ? ?Chief Complaint  ?Patient presents with  ? Emesis  ? ?Level of care: Med-Surg ? ?Brief Admission History:  ?81 y.o. female with medical history significant of atrial fibrillation on warfarin, right upper lobe lung cancer status post right upper lobectomy with lymph node excision (08/15/2020), hypothyroidism, immunotherapy pneumonitis, GERD, HLD admitted on 06/20/2020 with dysphagia and intractable emesis in the setting of supratherapeutic INR ?  ?Assessment and Plan: ?1)Esophageal Dysphagia with intractable emesis--GI consult appreciated ?Doing better with liquid diet however had significant difficulty with grits ?-Nystatin as ordered by GI team ?-Given persistent esophageal dysphagia plan is for EGD with dilatation  ?-INR is 1.6 to 1.7  today GI service would like to defer EGD with dilatation--- they would like INR to be below 1.5 prior to doing EGD with dilatation ?-INR 1.7 today.  See GI notes and recommendations.   ?  ?2) diarrhea and dehydration--doubt significant GI infection ?-Overall diarrhea has resolved ?  ?3) chronic atrial fibrillation--- continue amiodarone and Toprol-XL for rate control ?-INR was supratherapeutic (INR > 10 on admission ) ?-Supratherapeutic INR is due to recent Levaquin use with cytochrome P450 interaction in the setting of poor oral intake with limited vitamin K ingestion ?INR trending down after vitamin K and FFP administration   ?-Hold Coumadin for now pending procedures ?  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 Delton Coombes, she continues to be on chemotherapy with Keytruda ?  ?5) hypothyroidism--- continue Synthroid ?  ?6)HLD--continue Crestor ?  ?7)Immunotherapy pneumonitis ?Patient was started on Levaquin and prednisone, she already completed Levaquin ?Continue prednisone ?  ?8) leukocytosis/thrombocytopenia --- may be  reactive in the setting of intractable emesis or related to to steroid therapy induced demargination ?-Patient is being treated with Keytruda ?-No evidence of acute infection at this time continue to monitor ?  ?9) generalized weakness and deconditioning----in the setting of poor oral intake due to dysphagia ?-Patient will benefit from home health PT/OT rehab ?  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  creatinine on admission= 1.52 (peak) ?baseline creatinine =1.0     ? creatinine is now= 0.9 ?-AKI resolved with hydration ?renally adjust medications, avoid nephrotoxic agents / dehydration  / hypotension ?  ?  ?Disposition/Need for in-Hospital Stay- patient unable to be discharged at this time due to --dysphagia and intractable emesis requiring IV fluids and possible EGD with dilatation --unable to discharge pending tolerance of oral intake- ?-INR is 1.6 to 1.7  today GI service would like to defer EGD with dilatation on  06/25/2021--- they would like INR to be below 1.5 prior to doing EGD with dilatation ?-Possible discharge home in a couple of days if tolerating oral intake after EGD with dilatation ? ?DVT prophylaxis: SCDs ?Code Status: Full  ?Family Communication:  ?Disposition: Status is: Inpatient ?Remains inpatient appropriate because: awaiting INR<1.5 for EGD/dilatation ?  ?Consultants:  ?GI service  ?Procedures:  ?Tentative EGD/dilatation pending 4/27 ?Antimicrobials:  ?  ?Subjective: ?Pt without complaints, agreeable to EGD dilation today with Dr. Gala Romney.    ?Objective: ?Vitals:  ? 06/25/21 0410 06/25/21 1101 06/25/21 1244 06/25/21 2105  ?BP: 130/80 (!) 146/76 118/81 107/64  ?Pulse: 100 84 (!) 102 86  ?Resp: 20  18 18   ?Temp: 98.6 ?F (37 ?C)  98.3 ?  F (36.8 ?C) 97.8 ?F (36.6 ?C)  ?TempSrc: Oral  Oral Oral  ?SpO2: 94%  96% 96%  ?Weight:      ?Height:      ? ? ?Intake/Output Summary (Last 24 hours) at  06/26/2021 1232 ?Last data filed at 06/25/2021 1733 ?Gross per 24 hour  ?Intake 1480 ml  ?Output --  ?Net 1480 ml  ? ?Filed Weights  ? 06/20/21 1412 06/20/21 2300  ?Weight: 64 kg 64.6 kg  ? ?Examination: ? ?General exam: Appears calm and comfortable.   ?Respiratory system: Clear to auscultation. Respiratory effort normal. ?Cardiovascular system: normal S1 & S2 heard. No JVD, murmurs, rubs, gallops or clicks. No pedal edema. ?Gastrointestinal system: Abdomen is nondistended, soft and nontender. No organomegaly or masses felt. Normal bowel sounds heard. ?Central nervous system: Alert and oriented. No focal neurological deficits. ?Extremities: Symmetric 5 x 5 power. ?Skin: No rashes, lesions or ulcers. ?Psychiatry: Judgement and insight appear normal. Mood & affect appropriate.  ? ?Data Reviewed: I have personally reviewed following labs and imaging studies ? ?CBC: ?Recent Labs  ?Lab 06/20/21 ?1535 06/21/21 ?3662 06/22/21 ?9476 06/24/21 ?5465 06/25/21 ?0354 06/26/21 ?0414  ?WBC 18.1* 11.0* 12.2* 16.6* 14.1* 15.3*  ?NEUTROABS 16.9*  --   --   --   --   --   ?HGB 12.7 12.4 13.0 13.8 12.4 12.2  ?HCT 39.9 40.4 41.8 43.4 39.0 38.4  ?MCV 91.3 92.4 91.5 89.7 91.8 91.0  ?PLT 163 150 139* 128* 111* 110*  ? ? ?Basic Metabolic Panel: ?Recent Labs  ?Lab 06/21/21 ?6568 06/22/21 ?1275 06/24/21 ?1700 06/25/21 ?1749 06/26/21 ?0414  ?NA 139 140 138 139 139  ?K 4.1 4.7 3.9 4.0 3.7  ?CL 105 106 104 104 106  ?CO2 29 29 27 30 27   ?GLUCOSE 78 94 120* 102* 127*  ?BUN 35* 28* 15 13 17   ?CREATININE 1.39* 1.19* 0.92 0.99 0.96  ?CALCIUM 7.8* 7.8* 7.6* 7.8* 7.8*  ?MG 1.8  --   --   --   --   ?PHOS 3.3 3.1 2.3* 2.5  --   ? ? ?CBG: ?No results for input(s): GLUCAP in the last 168 hours. ? ?No results found for this or any previous visit (from the past 240 hour(s)).  ? ?Radiology Studies: ?No results found. ? ?Scheduled Meds: ? amiodarone  200 mg Oral Daily  ? feeding supplement  237 mL Oral BID BM  ? metoprolol succinate  25 mg Oral Daily  ? nystatin   5 mL Oral QID  ? pantoprazole  40 mg Oral BID  ? predniSONE  20 mg Oral Q breakfast  ? ?Continuous Infusions: ? sodium chloride Stopped (06/22/21 1741)  ? ? ? LOS: 5 days  ? ?Time spent: 35 mins ? ?Irwin Brakeman, MD ?How to contact the [facility] Attending or Consulting provider [place] or covering provider during after hours [place], for this patient?  ?Check the care team in [facility] and look for a) attending/consulting [facility] provider listed and b) the [facility] team listed ?Log into [email or web address] and use [place]'s universal password to access. If you do not have the password, please contact the hospital operator. ?Locate the [facility] provider you are looking for under [facility] and page to a number that you can be directly reached. ?If you still have difficulty reaching the provider, please page the [facility] (Director on Call) for the Hospitalists listed on amion for assistance. ? ?06/26/2021, 12:32 PM  ? ? ?

## 2021-06-26 NOTE — Progress Notes (Signed)
Stated ate all of supper except green beans.  At his point denies nausea. Granddaughter at bedside said that she did cough some but nothing came up and no complaints at this time. ?

## 2021-06-27 DIAGNOSIS — K219 Gastro-esophageal reflux disease without esophagitis: Secondary | ICD-10-CM | POA: Diagnosis not present

## 2021-06-27 DIAGNOSIS — R531 Weakness: Secondary | ICD-10-CM | POA: Diagnosis not present

## 2021-06-27 DIAGNOSIS — R112 Nausea with vomiting, unspecified: Secondary | ICD-10-CM | POA: Diagnosis not present

## 2021-06-27 DIAGNOSIS — R1319 Other dysphagia: Secondary | ICD-10-CM | POA: Diagnosis not present

## 2021-06-27 LAB — CBC
HCT: 37.4 % (ref 36.0–46.0)
Hemoglobin: 11.7 g/dL — ABNORMAL LOW (ref 12.0–15.0)
MCH: 28.8 pg (ref 26.0–34.0)
MCHC: 31.3 g/dL (ref 30.0–36.0)
MCV: 92.1 fL (ref 80.0–100.0)
Platelets: 108 10*3/uL — ABNORMAL LOW (ref 150–400)
RBC: 4.06 MIL/uL (ref 3.87–5.11)
RDW: 16.2 % — ABNORMAL HIGH (ref 11.5–15.5)
WBC: 11.9 10*3/uL — ABNORMAL HIGH (ref 4.0–10.5)
nRBC: 0 % (ref 0.0–0.2)

## 2021-06-27 LAB — BASIC METABOLIC PANEL
Anion gap: 7 (ref 5–15)
BUN: 17 mg/dL (ref 8–23)
CO2: 29 mmol/L (ref 22–32)
Calcium: 7.8 mg/dL — ABNORMAL LOW (ref 8.9–10.3)
Chloride: 104 mmol/L (ref 98–111)
Creatinine, Ser: 0.99 mg/dL (ref 0.44–1.00)
GFR, Estimated: 58 mL/min — ABNORMAL LOW (ref >60)
Glucose, Bld: 97 mg/dL (ref 70–99)
Potassium: 3.9 mmol/L (ref 3.5–5.1)
Sodium: 140 mmol/L (ref 135–145)

## 2021-06-27 LAB — MAGNESIUM: Magnesium: 1.5 mg/dL — ABNORMAL LOW (ref 1.7–2.4)

## 2021-06-27 LAB — PROTIME-INR
INR: 1.4 — ABNORMAL HIGH (ref 0.8–1.2)
Prothrombin Time: 17.4 s — ABNORMAL HIGH (ref 11.4–15.2)

## 2021-06-27 MED ORDER — WARFARIN SODIUM 2.5 MG PO TABS: 2.5000 mg | ORAL_TABLET | Freq: Once | ORAL | Status: DC

## 2021-06-27 MED ORDER — BISACODYL 5 MG PO TBEC: 5.0000 mg | DELAYED_RELEASE_TABLET | Freq: Every day | ORAL | Status: DC | PRN

## 2021-06-27 MED ORDER — MAGNESIUM SULFATE 4 GM/100ML IV SOLN
4.0000 g | Freq: Once | INTRAVENOUS | Status: AC
  Administered 2021-06-27: 4 g via INTRAVENOUS
  Filled 2021-06-27: qty 100

## 2021-06-27 MED ORDER — POLYETHYLENE GLYCOL 3350 17 G PO PACK
17.0000 g | PACK | Freq: Every day | ORAL | Status: DC
  Administered 2021-06-27: 17 g via ORAL
  Filled 2021-06-27: qty 1

## 2021-06-27 MED ORDER — WARFARIN SODIUM 2.5 MG PO TABS: 2.5000 mg | ORAL_TABLET | Freq: Every day | ORAL | Status: DC

## 2021-06-27 MED ORDER — IRBESARTAN 300 MG PO TABS: 150.0000 mg | ORAL_TABLET | Freq: Every day | ORAL | 0 refills | Status: DC

## 2021-06-27 MED ORDER — METOCLOPRAMIDE HCL 5 MG PO TABS: 10.0000 mg | ORAL_TABLET | Freq: Three times a day (TID) | ORAL | 3 refills | Status: DC | PRN

## 2021-06-27 MED ORDER — ENSURE ENLIVE PO LIQD: 237.0000 mL | Freq: Two times a day (BID) | ORAL | 1 refills | Status: AC

## 2021-06-27 NOTE — Progress Notes (Signed)
Patient reports doing well overnight.  Throat is a little sore. ?Tolerating a regular diet as long as she chews her food well and takes her time swallowing. ?Specifically, denies nausea ,vomiting, abdominal pain or diarrhea. ? ?Vital signs in last 24 hours: ?Temp:  [97.5 ?F (36.4 ?C)-98.3 ?F (36.8 ?C)] 97.7 ?F (36.5 ?C) (04/28 0356) ?Pulse Rate:  [85-101] 98 (04/28 0356) ?Resp:  [17-25] 20 (04/28 0356) ?BP: (88-160)/(29-101) 160/82 (04/28 0356) ?SpO2:  [91 %-98 %] 93 % (04/28 0356) ?Last BM Date : 06/25/21 ?General:   Alert,  pleasant and cooperative in NAD ?Abdomen:  Soft, nontender and nondistended.  Normal bowel sounds, without guarding, and without rebound.  No mass or organomegaly. ?   ? ?Intake/Output from previous day: ?04/27 0701 - 04/28 0700 ?In: 1080 [P.O.:480; I.V.:600] ?Out: -  ?Intake/Output this shift: ?Total I/O ?In: 120 [P.O.:120] ?Out: -  ? ?Lab Results: ?Recent Labs  ?  06/25/21 ?7824 06/26/21 ?0414 06/27/21 ?0455  ?WBC 14.1* 15.3* 11.9*  ?HGB 12.4 12.2 11.7*  ?HCT 39.0 38.4 37.4  ?PLT 111* 110* 108*  ? ?BMET ?Recent Labs  ?  06/25/21 ?0452 06/26/21 ?0414 06/27/21 ?0455  ?NA 139 139 140  ?K 4.0 3.7 3.9  ?CL 104 106 104  ?CO2 30 27 29   ?GLUCOSE 102* 127* 97  ?BUN 13 17 17   ?CREATININE 0.99 0.96 0.99  ?CALCIUM 7.8* 7.8* 7.8*  ? ?LFT ?Recent Labs  ?  06/25/21 ?0452  ?ALBUMIN 2.2*  ? ?PT/INR ?Recent Labs  ?  06/26/21 ?0414 06/27/21 ?0455  ?LABPROT 18.3* 17.4*  ?INR 1.5* 1.4*  ? ?Impression: Pleasant 81 year old lady admitted to hospital with nausea, vomiting, diarrhea and recurrent esophageal dysphagia.  She seems to have responded to empiric esophageal dilation (large bore -normal appearing esophagus) yesterday.   ? ?Self-limiting nausea, vomiting and diarrhea-nonspecific. ? ?Recommendations: Swallowing precautions reviewed.  Once daily PPI.  From a GI standpoint, okay to resume anticoagulation and discharged anytime. ? ?Follow-up with Dr. Laural Golden in 3 months ? ? ? ? ? ? ?  ?

## 2021-06-27 NOTE — Care Management Important Message (Signed)
Important Message ? ?Patient Details  ?Name: Katelyn [name] ?MRN: [id number] ?Date of Birth: Jul 26, 1940 ? ? ?Medicare Important Message Given:  Yes ? ? ? ? ?Tommy Medal ?06/27/2021, 1:24 PM ?

## 2021-06-27 NOTE — TOC Transition Note (Signed)
Transition of Care (TOC) - CM/SW Discharge Note ? ? ?Patient Details  ?Name: Katelyn [name] ?MRN: [id number] ?Date of Birth: May 07, 1940 ? ?Transition of Care (TOC) CM/SW Contact:  ?Boneta Lucks, RN ?Phone Number: ?06/27/2021, 11:27 AM ? ? ?Clinical Narrative:  Patient discharging home. [facility] for [facility]. [place] Updated.   ? ? ? ?Final next level of care: [place] ?Barriers to Discharge: Barriers Resolved ? ? ?Patient Goals and CMS Choice ?Patient states their goals for this hospitalization and ongoing recovery are:: go home with [facility] ?CMS Medicare.gov Compare Post Acute Care list provided to:: Patient Represenative (must comment) ?Choice offered to / list presented to : Adult Children ? ?Discharge Placement ?  ?        ?Patient and family notified of of transfer: 06/27/21 ? ?Discharge Plan and Services ?In-house Referral: Clinical Social Work ?  ?Post Acute Care Choice: Home Health, Durable Medical Equipment          ?DME Arranged: Tub bench ?DME Agency: [employer] ?Date DME Agency Contacted: 06/24/21 ?  ?Representative spoke with at DME Agency: Caryl Pina ?HH Arranged: PT, OT, Nurse's Aide ?[place] Agency: [place] ([place]) ?Date HH Agency Contacted: 06/24/21 ?  ?Representative spoke with at [place]: Vaughan Basta ?Readmission Risk Interventions ? ?  06/23/2021  ? 10:49 AM  ?Readmission Risk Prevention Plan  ?Transportation Screening Complete  ?Palliative Care Screening Not Applicable  ?Medication Review Press photographer) Complete  ? ? ? ? ? ?

## 2021-06-27 NOTE — Discharge Summary (Addendum)
Physician Discharge Summary  ?Katelyn [name] QQP:[id number] DOB: Sep 07, 1940 DOA: 06/20/2021 ? ?PCP: Celene Squibb, MD ?GI: Rehman  ? ?Admit date: 06/20/2021 ?Discharge date: 06/27/2021 ? ?Admitted From: Home  ?Disposition: Home  ? ?Recommendations for Outpatient Follow-up:  ?Follow up with PCP in 1 weeks ?Follow up with Dr. Laural Golden in 3 months  ?Please check PT/INR in 3-7 days for routine monitoring ? ?Discharge Condition: STABLE   ?CODE STATUS: FuLL ?DIET: heart healthy soft foods   ? ?Brief Hospitalization Summary: ?Please see all hospital notes, images, labs for full details of the hospitalization. ?Brief Admission History:  ?81 y.o. female with medical history significant of atrial fibrillation on warfarin, right upper lobe lung cancer status post right upper lobectomy with lymph node excision (08/15/2020), hypothyroidism, immunotherapy pneumonitis, GERD, HLD admitted on 06/20/2020 with dysphagia and intractable emesis in the setting of supratherapeutic INR ?  ?Assessment and Plan: ?1)Esophageal Dysphagia with intractable emesis--GI consult appreciated ? ?-Given persistent esophageal dysphagia plan is for EGD with dilatation on 4/27, pt tolerated well and doing better now.  DC home today.   ?-INR is 1.6 to 1.7  today GI service would like to defer EGD with dilatation--- they would like INR to be below 1.5 prior to doing EGD with dilatation ?-postprocedure warfarin resumed per GI recs.  See GI notes and recommendations.   ?  ?2) diarrhea and dehydration--RESOLVED  ?-Overall diarrhea has resolved ?  ?3) chronic atrial fibrillation--- continue amiodarone and Toprol-XL for rate control ?-INR was supratherapeutic (INR > 10 on admission ) ?-Supratherapeutic INR is due to recent Levaquin use with cytochrome P450 interaction in the setting of poor oral intake with limited vitamin K ingestion ?INR trending down after vitamin K and FFP administration   ?-per GI resume warfarin now post procedures  ?  ?4)Large cell neuroendocrine  carcinoma right upper lobe ( PT2PN0 M1) ?Patient follows with Dr. Delton Coombes, she continues to be on chemotherapy with Keytruda ?  ?5) hypothyroidism--- continue Synthroid ?  ?6)HLD--continue Crestor ?  ?7)Immunotherapy pneumonitis ?Patient was started on Levaquin and prednisone, she already completed Levaquin ?Continue prednisone ?  ?8) leukocytosis/thrombocytopenia --- may be reactive in the setting of intractable emesis or related to to steroid therapy induced demargination ?-Patient is being treated with Keytruda ?-No evidence of acute infection at this time continue to monitor ?  ?9) generalized weakness and deconditioning----in the setting of poor oral intake due to dysphagia ?-Patient will benefit from home health PT/OT rehab ?  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  creatinine on admission= 1.52 (peak) ?baseline creatinine =1.0     ? creatinine is now= 0.9 ?-AKI resolved with hydration ?renally adjust medications, avoid nephrotoxic agents / dehydration  / hypotension ? ?Hypomagnesemia  ?- IV replacement given prior to discharge home  ? ?Consultants:  ?GI service  ? ?Discharge Diagnoses:  ?Principal Problem: ?  Nausea and vomiting ?Active Problems: ?  Mixed hyperlipidemia ?  Paroxysmal atrial fibrillation (HCC) ?  Gastroesophageal reflux disease ?  Esophageal dysphagia ?  Hypothyroidism ?  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[place]) ?  Generalized weakness ?  Diarrhea ?  Leukocytosis ?  Hypoalbuminemia ?  Supratherapeutic INR ?  Dysphagia ?  Malnutrition of moderate degree ? ? ?Discharge Instructions: ? ?Allergies as of 06/27/2021   ? ?   Reactions  ? Adhesive [tape] Rash  ? Latex Rash  ? Tetanus Toxoids  Rash  ? Wound Dressing Adhesive Rash  ? ?  ? ?  ?Medication List  ?  ? ?STOP taking these medications   ? ?diphenhydrAMINE 25 MG tablet ?Commonly known as: BENADRYL ?  ?diphenoxylate-atropine 2.5-0.025 MG  tablet ?Commonly known as: LOMOTIL ?  ?KEYTRUDA IV ?  ?levofloxacin 500 MG tablet ?Commonly known as: Levaquin ?  ?meloxicam 15 MG tablet ?Commonly known as: MOBIC ?  ?potassium chloride 10 MEQ tablet ?Commonly known as: KLOR-CON M ?  ? ?  ? ?TAKE these medications   ? ?acetaminophen 500 MG tablet ?Commonly known as: TYLENOL ?Take 500 mg by mouth every 6 (six) hours as needed for moderate pain. ?  ?albuterol 108 (90 Base) MCG/ACT inhaler ?Commonly known as: VENTOLIN HFA ?Inhale 2 puffs into the lungs 2 (two) times daily as needed for wheezing or shortness of breath. ?  ?amiodarone 200 MG tablet ?Commonly known as: PACERONE ?Take 1 tablet by mouth once daily ?  ?estradiol 2 MG tablet ?Commonly known as: ESTRACE ?Take 1 tablet (2 mg total) by mouth daily. ?  ?feeding supplement Liqd ?Take 237 mLs by mouth 2 (two) times daily between meals. ?  ?furosemide 20 MG tablet ?Commonly known as: LASIX ?Take 1 tablet by mouth once daily ?  ?ipratropium 0.06 % nasal spray ?Commonly known as: ATROVENT ?USE 1 SPRAY(S) IN EACH NOSTRIL EVERY 6 HOURS AS NEEDED FOR RUNNY NOSE ?What changed:  ?how much to take ?how to take this ?when to take this ?reasons to take this ?additional instructions ?  ?irbesartan 300 MG tablet ?Commonly known as: AVAPRO ?Take 0.5 tablets (150 mg total) by mouth daily. ?What changed: how much to take ?  ?levothyroxine 50 MCG tablet ?Commonly known as: Synthroid ?Take 1 tablet (50 mcg total) by mouth daily before breakfast. ?  ?lidocaine-prilocaine cream ?Commonly known as: EMLA ?Apply a small amount to port a cath site and cover with plastic wrap 1 hour prior to infusion appointments ?  ?metoCLOPramide 5 MG tablet ?Commonly known as: REGLAN ?Take 2 tablets (10 mg total) by mouth every 8 (eight) hours as needed for nausea, vomiting or refractory nausea / vomiting. ?What changed:  ?when to take this ?reasons to take this ?  ?metoprolol succinate 25 MG 24 hr tablet ?Commonly known as: TOPROL-XL ?Take 1 tablet by  mouth once daily ?  ?nitroGLYCERIN 0.4 MG SL tablet ?Commonly known as: NITROSTAT ?Place 0.4 mg under the tongue every 5 (five) minutes as needed for chest pain. ?  ?OVER THE COUNTER MEDICATION ?Apply 1 application topically every other day. Lantispetic cream ?  ?oxyCODONE 5 MG immediate release tablet ?Commonly known as: Oxy IR/ROXICODONE ?Take 1 tablet (5 mg total) by mouth at bedtime as needed for severe pain. ?  ?pantoprazole 40 MG tablet ?Commonly known as: PROTONIX ?TAKE 1 TABLET BY MOUTH ONCE DAILY BEFORE SUPPER ?What changed: See the new instructions. ?  ?predniSONE 20 MG tablet ?Commonly known as: DELTASONE ?Take 1 tablet (20 mg total) by mouth daily with breakfast. ?  ?prochlorperazine 10 MG tablet ?Commonly known as: COMPAZINE ?Take 1 tablet (10 mg total) by mouth every 6 (six) hours as needed (Nausea or vomiting). ?  ?rosuvastatin 20 MG tablet ?Commonly known as: CRESTOR ?Take 1 tablet (20 mg total) by mouth at bedtime. ?  ?warfarin 2.5 MG tablet ?Commonly known as: COUMADIN ?Take as directed. If you are unsure how to take this medication, talk to your nurse or doctor. ?Original instructions: Take 1 tablet (2.5 mg total) by mouth  at bedtime. ?  ? ?  ? ?  ?  ? ? ?  ?Durable Medical Equipment  ?(From admission, onward)  ?  ? ? ?  ? ?  Start     Ordered  ? 06/23/21 1209  For home use only DME Tub bench  Once       ?Comments: Generalized weakness and deconditioning  ? 06/23/21 1209  ? ?  ?  ? ?  ? ? Follow-up Information   ? ? [facility]-Home Follow up.   ?Specialty: Home Health Services ?Why: They will call to schedule your first home visit. ? ?  ?  ? ? [place]. Schedule an appointment as soon as possible for a visit in 3 month(s).   ?Specialty: Gastroenterology ?Why: Hospital Follow Up ?Contact information: ?[address] ?[address] ?[place] ?[phone] ? ?  ?  ? ?  ?  ? ?  ? ?Allergies  ?Allergen Reactions  ? Adhesive [Tape] Rash   ? Latex Rash  ? Tetanus Toxoids Rash  ? Wound Dressing Adhesive Rash  ? ?Allergies as of 06/27/2021   ? ?   Reactions  ? Adhesive [tape] Rash  ? Latex Rash  ? Tetanus Toxoids Rash  ? Wound Dressing Ad

## 2021-06-30 ENCOUNTER — Ambulatory Visit (INDEPENDENT_AMBULATORY_CARE_PROVIDER_SITE_OTHER): Payer: Medicare Other | Admitting: Cardiology

## 2021-06-30 ENCOUNTER — Encounter (HOSPITAL_COMMUNITY): Payer: Self-pay | Admitting: Internal Medicine

## 2021-06-30 DIAGNOSIS — D696 Thrombocytopenia, unspecified: Secondary | ICD-10-CM | POA: Diagnosis not present

## 2021-06-30 DIAGNOSIS — I739 Peripheral vascular disease, unspecified: Secondary | ICD-10-CM | POA: Diagnosis not present

## 2021-06-30 DIAGNOSIS — I69354 Hemiplegia and hemiparesis following cerebral infarction affecting left non-dominant side: Secondary | ICD-10-CM | POA: Diagnosis not present

## 2021-06-30 DIAGNOSIS — Z79891 Long term (current) use of opiate analgesic: Secondary | ICD-10-CM | POA: Diagnosis not present

## 2021-06-30 DIAGNOSIS — Z87891 Personal history of nicotine dependence: Secondary | ICD-10-CM | POA: Diagnosis not present

## 2021-06-30 DIAGNOSIS — M1990 Unspecified osteoarthritis, unspecified site: Secondary | ICD-10-CM | POA: Diagnosis not present

## 2021-06-30 DIAGNOSIS — K219 Gastro-esophageal reflux disease without esophagitis: Secondary | ICD-10-CM | POA: Diagnosis not present

## 2021-06-30 DIAGNOSIS — I129 Hypertensive chronic kidney disease with stage 1 through stage 4 chronic kidney disease, or unspecified chronic kidney disease: Secondary | ICD-10-CM | POA: Diagnosis not present

## 2021-06-30 DIAGNOSIS — Z95820 Peripheral vascular angioplasty status with implants and grafts: Secondary | ICD-10-CM | POA: Diagnosis not present

## 2021-06-30 DIAGNOSIS — R1319 Other dysphagia: Secondary | ICD-10-CM | POA: Diagnosis not present

## 2021-06-30 DIAGNOSIS — Z7901 Long term (current) use of anticoagulants: Secondary | ICD-10-CM | POA: Diagnosis not present

## 2021-06-30 DIAGNOSIS — I700 Atherosclerosis of aorta: Secondary | ICD-10-CM | POA: Diagnosis not present

## 2021-06-30 DIAGNOSIS — I779 Disorder of arteries and arterioles, unspecified: Secondary | ICD-10-CM | POA: Diagnosis not present

## 2021-06-30 DIAGNOSIS — I480 Paroxysmal atrial fibrillation: Secondary | ICD-10-CM | POA: Diagnosis not present

## 2021-06-30 DIAGNOSIS — I440 Atrioventricular block, first degree: Secondary | ICD-10-CM | POA: Diagnosis not present

## 2021-06-30 DIAGNOSIS — E8809 Other disorders of plasma-protein metabolism, not elsewhere classified: Secondary | ICD-10-CM | POA: Diagnosis not present

## 2021-06-30 DIAGNOSIS — Z7952 Long term (current) use of systemic steroids: Secondary | ICD-10-CM | POA: Diagnosis not present

## 2021-06-30 DIAGNOSIS — J702 Acute drug-induced interstitial lung disorders: Secondary | ICD-10-CM | POA: Diagnosis not present

## 2021-06-30 DIAGNOSIS — N1831 Chronic kidney disease, stage 3a: Secondary | ICD-10-CM | POA: Diagnosis not present

## 2021-06-30 DIAGNOSIS — Z952 Presence of prosthetic heart valve: Secondary | ICD-10-CM | POA: Diagnosis not present

## 2021-06-30 DIAGNOSIS — E782 Mixed hyperlipidemia: Secondary | ICD-10-CM | POA: Diagnosis not present

## 2021-06-30 DIAGNOSIS — M5416 Radiculopathy, lumbar region: Secondary | ICD-10-CM | POA: Diagnosis not present

## 2021-06-30 DIAGNOSIS — I2510 Atherosclerotic heart disease of native coronary artery without angina pectoris: Secondary | ICD-10-CM | POA: Diagnosis not present

## 2021-06-30 DIAGNOSIS — T451X5D Adverse effect of antineoplastic and immunosuppressive drugs, subsequent encounter: Secondary | ICD-10-CM | POA: Diagnosis not present

## 2021-06-30 DIAGNOSIS — C3411 Malignant neoplasm of upper lobe, right bronchus or lung: Secondary | ICD-10-CM | POA: Diagnosis not present

## 2021-06-30 LAB — POCT INR: INR: 2.9 (ref 2.0–3.0)

## 2021-07-01 DIAGNOSIS — I69354 Hemiplegia and hemiparesis following cerebral infarction affecting left non-dominant side: Secondary | ICD-10-CM | POA: Diagnosis not present

## 2021-07-01 DIAGNOSIS — J702 Acute drug-induced interstitial lung disorders: Secondary | ICD-10-CM | POA: Diagnosis not present

## 2021-07-01 DIAGNOSIS — I480 Paroxysmal atrial fibrillation: Secondary | ICD-10-CM | POA: Diagnosis not present

## 2021-07-01 DIAGNOSIS — C3411 Malignant neoplasm of upper lobe, right bronchus or lung: Secondary | ICD-10-CM | POA: Diagnosis not present

## 2021-07-01 DIAGNOSIS — T451X5D Adverse effect of antineoplastic and immunosuppressive drugs, subsequent encounter: Secondary | ICD-10-CM | POA: Diagnosis not present

## 2021-07-01 DIAGNOSIS — R1319 Other dysphagia: Secondary | ICD-10-CM | POA: Diagnosis not present

## 2021-07-02 ENCOUNTER — Encounter (INDEPENDENT_AMBULATORY_CARE_PROVIDER_SITE_OTHER): Payer: Medicare Other | Admitting: Nurse Practitioner

## 2021-07-03 ENCOUNTER — Ambulatory Visit (HOSPITAL_COMMUNITY): Payer: Medicare Other | Admitting: Hematology

## 2021-07-03 ENCOUNTER — Inpatient Hospital Stay (HOSPITAL_COMMUNITY): Payer: Medicare Other

## 2021-07-04 ENCOUNTER — Other Ambulatory Visit (HOSPITAL_COMMUNITY): Payer: Self-pay

## 2021-07-04 MED ORDER — FUROSEMIDE 20 MG PO TABS: 20.0000 mg | ORAL_TABLET | Freq: Every day | ORAL | 4 refills | Status: DC

## 2021-07-07 DIAGNOSIS — C3490 Malignant neoplasm of unspecified part of unspecified bronchus or lung: Secondary | ICD-10-CM | POA: Diagnosis not present

## 2021-07-07 DIAGNOSIS — Z0189 Encounter for other specified special examinations: Secondary | ICD-10-CM | POA: Diagnosis not present

## 2021-07-07 DIAGNOSIS — M6281 Muscle weakness (generalized): Secondary | ICD-10-CM | POA: Diagnosis not present

## 2021-07-07 DIAGNOSIS — R1110 Vomiting, unspecified: Secondary | ICD-10-CM | POA: Diagnosis not present

## 2021-07-07 DIAGNOSIS — B370 Candidal stomatitis: Secondary | ICD-10-CM | POA: Diagnosis not present

## 2021-07-07 DIAGNOSIS — I480 Paroxysmal atrial fibrillation: Secondary | ICD-10-CM | POA: Diagnosis not present

## 2021-07-07 DIAGNOSIS — I10 Essential (primary) hypertension: Secondary | ICD-10-CM | POA: Diagnosis not present

## 2021-07-07 DIAGNOSIS — J439 Emphysema, unspecified: Secondary | ICD-10-CM | POA: Diagnosis not present

## 2021-07-07 DIAGNOSIS — R1319 Other dysphagia: Secondary | ICD-10-CM | POA: Diagnosis not present

## 2021-07-07 DIAGNOSIS — I700 Atherosclerosis of aorta: Secondary | ICD-10-CM | POA: Diagnosis not present

## 2021-07-08 ENCOUNTER — Ambulatory Visit (INDEPENDENT_AMBULATORY_CARE_PROVIDER_SITE_OTHER): Payer: Medicare Other | Admitting: Pharmacist

## 2021-07-08 DIAGNOSIS — J702 Acute drug-induced interstitial lung disorders: Secondary | ICD-10-CM | POA: Diagnosis not present

## 2021-07-08 DIAGNOSIS — I4891 Unspecified atrial fibrillation: Secondary | ICD-10-CM

## 2021-07-08 DIAGNOSIS — T451X5D Adverse effect of antineoplastic and immunosuppressive drugs, subsequent encounter: Secondary | ICD-10-CM | POA: Diagnosis not present

## 2021-07-08 DIAGNOSIS — Z952 Presence of prosthetic heart valve: Secondary | ICD-10-CM | POA: Diagnosis not present

## 2021-07-08 DIAGNOSIS — I480 Paroxysmal atrial fibrillation: Secondary | ICD-10-CM | POA: Diagnosis not present

## 2021-07-08 DIAGNOSIS — C3411 Malignant neoplasm of upper lobe, right bronchus or lung: Secondary | ICD-10-CM | POA: Diagnosis not present

## 2021-07-08 DIAGNOSIS — I69354 Hemiplegia and hemiparesis following cerebral infarction affecting left non-dominant side: Secondary | ICD-10-CM | POA: Diagnosis not present

## 2021-07-08 DIAGNOSIS — R1319 Other dysphagia: Secondary | ICD-10-CM | POA: Diagnosis not present

## 2021-07-08 DIAGNOSIS — Z7901 Long term (current) use of anticoagulants: Secondary | ICD-10-CM | POA: Diagnosis not present

## 2021-07-08 LAB — POCT INR: INR: 2.8 (ref 2.0–3.0)

## 2021-07-08 NOTE — Patient Instructions (Signed)
Description   ?continue taking warfarin 1 tablet (2.5mg ) daily. Recheck INR in 1 week. [facility] [phone] ?  ? ? ?

## 2021-07-09 DIAGNOSIS — T451X5D Adverse effect of antineoplastic and immunosuppressive drugs, subsequent encounter: Secondary | ICD-10-CM | POA: Diagnosis not present

## 2021-07-09 DIAGNOSIS — I480 Paroxysmal atrial fibrillation: Secondary | ICD-10-CM | POA: Diagnosis not present

## 2021-07-09 DIAGNOSIS — R1319 Other dysphagia: Secondary | ICD-10-CM | POA: Diagnosis not present

## 2021-07-09 DIAGNOSIS — J702 Acute drug-induced interstitial lung disorders: Secondary | ICD-10-CM | POA: Diagnosis not present

## 2021-07-09 DIAGNOSIS — I69354 Hemiplegia and hemiparesis following cerebral infarction affecting left non-dominant side: Secondary | ICD-10-CM | POA: Diagnosis not present

## 2021-07-09 DIAGNOSIS — C3411 Malignant neoplasm of upper lobe, right bronchus or lung: Secondary | ICD-10-CM | POA: Diagnosis not present

## 2021-07-10 DIAGNOSIS — J702 Acute drug-induced interstitial lung disorders: Secondary | ICD-10-CM | POA: Diagnosis not present

## 2021-07-10 DIAGNOSIS — I480 Paroxysmal atrial fibrillation: Secondary | ICD-10-CM | POA: Diagnosis not present

## 2021-07-10 DIAGNOSIS — R1319 Other dysphagia: Secondary | ICD-10-CM | POA: Diagnosis not present

## 2021-07-10 DIAGNOSIS — T451X5D Adverse effect of antineoplastic and immunosuppressive drugs, subsequent encounter: Secondary | ICD-10-CM | POA: Diagnosis not present

## 2021-07-10 DIAGNOSIS — C3411 Malignant neoplasm of upper lobe, right bronchus or lung: Secondary | ICD-10-CM | POA: Diagnosis not present

## 2021-07-10 DIAGNOSIS — I69354 Hemiplegia and hemiparesis following cerebral infarction affecting left non-dominant side: Secondary | ICD-10-CM | POA: Diagnosis not present

## 2021-07-14 DIAGNOSIS — I480 Paroxysmal atrial fibrillation: Secondary | ICD-10-CM | POA: Diagnosis not present

## 2021-07-14 DIAGNOSIS — R1319 Other dysphagia: Secondary | ICD-10-CM | POA: Diagnosis not present

## 2021-07-14 DIAGNOSIS — T451X5D Adverse effect of antineoplastic and immunosuppressive drugs, subsequent encounter: Secondary | ICD-10-CM | POA: Diagnosis not present

## 2021-07-14 DIAGNOSIS — I69354 Hemiplegia and hemiparesis following cerebral infarction affecting left non-dominant side: Secondary | ICD-10-CM | POA: Diagnosis not present

## 2021-07-14 DIAGNOSIS — C3411 Malignant neoplasm of upper lobe, right bronchus or lung: Secondary | ICD-10-CM | POA: Diagnosis not present

## 2021-07-14 DIAGNOSIS — J702 Acute drug-induced interstitial lung disorders: Secondary | ICD-10-CM | POA: Diagnosis not present

## 2021-07-15 DIAGNOSIS — T451X5D Adverse effect of antineoplastic and immunosuppressive drugs, subsequent encounter: Secondary | ICD-10-CM | POA: Diagnosis not present

## 2021-07-15 DIAGNOSIS — J702 Acute drug-induced interstitial lung disorders: Secondary | ICD-10-CM | POA: Diagnosis not present

## 2021-07-15 DIAGNOSIS — R1319 Other dysphagia: Secondary | ICD-10-CM | POA: Diagnosis not present

## 2021-07-15 DIAGNOSIS — C3411 Malignant neoplasm of upper lobe, right bronchus or lung: Secondary | ICD-10-CM | POA: Diagnosis not present

## 2021-07-15 DIAGNOSIS — I480 Paroxysmal atrial fibrillation: Secondary | ICD-10-CM | POA: Diagnosis not present

## 2021-07-15 DIAGNOSIS — I69354 Hemiplegia and hemiparesis following cerebral infarction affecting left non-dominant side: Secondary | ICD-10-CM | POA: Diagnosis not present

## 2021-07-15 LAB — POCT INR: INR: 2.7 (ref 2.0–3.0)

## 2021-07-17 ENCOUNTER — Ambulatory Visit (INDEPENDENT_AMBULATORY_CARE_PROVIDER_SITE_OTHER): Payer: Medicare Other | Admitting: Internal Medicine

## 2021-07-17 DIAGNOSIS — Z7901 Long term (current) use of anticoagulants: Secondary | ICD-10-CM

## 2021-07-21 ENCOUNTER — Other Ambulatory Visit (HOSPITAL_COMMUNITY): Payer: Medicare Other

## 2021-07-21 ENCOUNTER — Ambulatory Visit (HOSPITAL_COMMUNITY): Payer: Medicare Other | Admitting: Hematology

## 2021-07-21 DIAGNOSIS — J702 Acute drug-induced interstitial lung disorders: Secondary | ICD-10-CM | POA: Diagnosis not present

## 2021-07-21 DIAGNOSIS — I69354 Hemiplegia and hemiparesis following cerebral infarction affecting left non-dominant side: Secondary | ICD-10-CM | POA: Diagnosis not present

## 2021-07-21 DIAGNOSIS — I480 Paroxysmal atrial fibrillation: Secondary | ICD-10-CM | POA: Diagnosis not present

## 2021-07-21 DIAGNOSIS — R1319 Other dysphagia: Secondary | ICD-10-CM | POA: Diagnosis not present

## 2021-07-21 DIAGNOSIS — T451X5D Adverse effect of antineoplastic and immunosuppressive drugs, subsequent encounter: Secondary | ICD-10-CM | POA: Diagnosis not present

## 2021-07-21 DIAGNOSIS — C3411 Malignant neoplasm of upper lobe, right bronchus or lung: Secondary | ICD-10-CM | POA: Diagnosis not present

## 2021-07-22 ENCOUNTER — Ambulatory Visit (INDEPENDENT_AMBULATORY_CARE_PROVIDER_SITE_OTHER): Payer: Medicare Other | Admitting: Internal Medicine

## 2021-07-22 DIAGNOSIS — Z7901 Long term (current) use of anticoagulants: Secondary | ICD-10-CM

## 2021-07-22 LAB — POCT INR: INR: 4.7 — AB (ref 2.0–3.0)

## 2021-07-23 ENCOUNTER — Ambulatory Visit (INDEPENDENT_AMBULATORY_CARE_PROVIDER_SITE_OTHER): Payer: Medicare Other | Admitting: Cardiovascular Disease

## 2021-07-23 ENCOUNTER — Encounter: Payer: Self-pay | Admitting: Cardiovascular Disease

## 2021-07-23 DIAGNOSIS — I4819 Other persistent atrial fibrillation: Secondary | ICD-10-CM | POA: Diagnosis not present

## 2021-07-23 DIAGNOSIS — Z952 Presence of prosthetic heart valve: Secondary | ICD-10-CM | POA: Diagnosis not present

## 2021-07-23 DIAGNOSIS — Z7901 Long term (current) use of anticoagulants: Secondary | ICD-10-CM

## 2021-07-23 DIAGNOSIS — N1832 Chronic kidney disease, stage 3b: Secondary | ICD-10-CM

## 2021-07-23 DIAGNOSIS — E785 Hyperlipidemia, unspecified: Secondary | ICD-10-CM | POA: Diagnosis not present

## 2021-07-23 DIAGNOSIS — I4891 Unspecified atrial fibrillation: Secondary | ICD-10-CM

## 2021-07-23 DIAGNOSIS — C3411 Malignant neoplasm of upper lobe, right bronchus or lung: Secondary | ICD-10-CM

## 2021-07-23 DIAGNOSIS — I739 Peripheral vascular disease, unspecified: Secondary | ICD-10-CM

## 2021-07-23 DIAGNOSIS — Z5181 Encounter for therapeutic drug level monitoring: Secondary | ICD-10-CM

## 2021-07-23 MED ORDER — AMIODARONE HCL 200 MG PO TABS: 200.0000 mg | ORAL_TABLET | Freq: Two times a day (BID) | ORAL | 6 refills | Status: AC

## 2021-07-23 NOTE — Patient Instructions (Signed)
Medication Instructions:  INCREASE AMIODARONE 200MG  TWICE  *If you need a refill on your cardiac medications before your next appointment, please call your pharmacy*  Lab Work:    CMET, MAG TSH AND CBC WHEN YOU HAVE LAB DONE WITH PRIMARY MD     If you have labs (blood work) drawn today and your tests are completely normal, you will receive your results only by: [place] (if you have MyChart) OR  A paper copy in the mail If you have any lab test that is abnormal or we need to change your treatment, we will call you to review the results.  Testing/Procedures:  Echocardiogram - Your physician has requested that you have an echocardiogram. Echocardiography is a painless test that uses sound waves to create images of your heart. It provides your doctor with information about the size and shape of your heart and how well your heart's chambers and valves are working. This procedure takes approximately one hour. There are no restrictions for this procedure.   Special Instructions MAKE SURE TO DO INR TOMORROW  Follow-Up: Your next appointment:  6 week(s) In Person with ANY APP   At [facility], you and your health needs are our priority.  As part of our continuing mission to provide you with exceptional heart care, we have created designated Provider Care Teams.  These Care Teams include your primary Cardiologist (physician) and Advanced Practice Providers (APPs -  Physician Assistants and Nurse Practitioners) who all work together to provide you with the care you need, when you need it.    Important Information About Sugar

## 2021-07-23 NOTE — Progress Notes (Signed)
Patient ID: Katelyn [name], female   DOB: [birth year], 82 y.o.   MRN: [id number]    PCP: Dr. Newt Minion  HPI: Katelyn [name] is a 81 y.o. female who presents to the office for a 15 month follow-up cardiology evaluation  Katelyn [name] has known CAD and underwent initial cardiac catheterization in  April 2012 which revealed mid RCA occlusion with collaterals, 50% LAD stenosis, 30% circumflex stenosis, and 40% marginal stenosis. 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 Trula Slade. She has a history of hypertension with grade 2 diastolic dysfunction, hyperlipidemia, and has developed moderately severe aortic valve stenosis. An echo Doppler study in May 2013 showed a mean transvalvular aortic gradient of 34 with a maximum of 56. In December 2013, her mean gradient was 32 and peak 56 with a valve area of 0.84. An echo Doppler study on 12/06/2012 continued to show normal systolic function with an ejection fraction of 60-65%. She had grade 1 diastolic dysfunction. Peak gradients were  61 mm and mean gradient was 38 mm.  The aortic valve area 0.8-0.9 cm. She had mild to moderate aortic insufficiency. Had mildly thickened mitral valve leaflets with mild MR. The left atrium was mildly dilated. She had mild TR. Pulmonary pressure was mildly increased at 33 mm.  A follow-up echo Doppler study on 02/12/2014  Showed normal EF at 55-60%. Her aortic valve was severely calcified and stenosis was felt to be severe with a mean gradient of 42, a peak gradient of 64 which had slightly increaed from 38 and 61 mm, respectively.  She began to develop exertional chest discomfort with associated dyspnea and fatigue.  She underwent repeat cardiac catheterization on 07/25/2014 which showed a chronically occluded RCA with left-to-right collaterals, a 70% diagonal stenosis, 50% ostial circumflex  stenosis and 40% AV groove circumflex stenosis.  Her aortic stenosis at further progressed such that she had a mean gradient of 42 mm.  She was felt to be a poor candidate for conventional aortic valve replacement and on 08/31/2014 underwent successful  TAVR with placement of a 26 mm Edward Sapien 3 valve from the right femoral approach by Drs. Roxy Manns and Spurgeon.  She developed postoperative atrial fibrillation for which she was transiently treated with amiodarone and when seen for office follow-up she was back in sinus rhythm.  She has noticed improvement in energy.  She is unaware of recurrent atrial fibrillation.  She denies recurrent chest tightness.  She at times is fatigued.  A subsequent echo on 10/01/2014 showed an EF of 60-65% with a mean aortic gradient of 16 mm and peak gradient 26 with trivial aortic insufficiency.    A follow-up echo Doppler study on 09/09/2015 showed an EF of 60-65% and grade 2 diastolic dysfunction.  There were no wall motion abnormalities.  Her 26 mm Edward's Sapien 3 valve was well-seated from her TAVR procedure.  The mean valve gradient was 16 mm.  There was no evidence for perivalvular leak.  There was mitral annular calcification with mild MR, mild LA dilatation and mild dilation of her right ventricle.  There is mild coronary hypertension with a PA pressure 33 mm.    When I saw her in October 2017, her dose of valsartan had been reduced prior to the office visit by Dr. Karie Kirks.  At that time, she was having more frequent palpitations and had trigeminy on her ECG.  With her being on reduce valsartan.  I recommended further titration of her beta blocker and increased her Toprol to 50 mg daily.    When I saw her in December 2017 her blood pressure was significantly elevated.  Her resting pulse was 57.  She was no longer taking valsartan and I added amlodipine 5 mg for more optimal blood pressure control.  She  underwent lower extremity Doppler studies on 03/16/2016 by  [facility] which revealed normal ABIs bilaterally. When I saw her in f/u on 03/31/16 BP was improved but was in atrial fibrillation which was new.  She has continued to be on warfarin anticoagulation and her INR was therapeutic.  I further titrated Toprol-XL to 75 mg for 5 days and recommended that she increase it to 100 mg depending upon her heart rate response.  She did take 100 mg, she did experience some dizziness and as result, self reduced her back to 75 mg daily.  She is unaware of her rhythm being irregular.  In March 2018 her blood pressure was stable.  On 09/09/2016, she underwent a one-year follow-up echo Doppler study.  This showed mild LVH with normal systolic function.  There was grade 1 diastolic dysfunction.  The TAVR bioprosthesis was functioning well.  She had mild LA dilation and mild pulmonary hypertension at 40 mm.  She recently underwent lab work in May by Dr. Karie Kirks.  After she left the office.  I was able to obtain these results.  This showed a cholesterol of 128 and LDL of 54, HDL 49, and triglycerides of 168.  Her creatinine was 1.0.  Hemoglobin 15.7, hematocrit 45.8.  She had recently been notified by her pharmacy that the valsartan that she was taken was recently recalled.  When I last saw her, she had stopped taking the valsartan.  I changed her to irbesartan 150 mg.  She was seen by Dr. Meda Coffee, for primary care in [place] in early February 2019. Her INR was therapeutic at 2.6.  She was on amlodipine 5 mg, furosemide as needed, irbesartan 150 mg, and Toprol 25 mg in addition to rosuvastatin 20 mg and warfarin.  Lab from 04/24/17  showed a creatinine of 1.01.  TSH was pending.  Hemoglobin 15, hematocrit 43, but platelets were decreased at 1 25,000.  Total cholesterol is 124, HDL 46, triglycerides 214, and LDL 50.  Vitamin D was very low at 7.  When I saw her in February 2019, her ECG revealed that she was in atrial fibrillation for which she was asymptomatic.  At that time, I  titrated metoprolol succinate to 100 mg daily.  I also started her on 50,000 units of vitamin D weekly for at least 3 months.  She underwent a follow-up echo Doppler study on for 05/21/2017 which showed an EF of 60-65% with grade 2 diastolic dysfunction.  HerTAVR bioprosthesis was well-seated and functioned well.  There was no aortic insufficiency. Her left atrium was severely dilated.  Estimated PA pressure was 34 mm.   I saw her in March 2019 at which time she was maintaining sinus rhythm and ECG showed sinus bradycardia at 56 bpm with left bundle branch block.  She underwent a carotid study in May 2019 which showed mild carotid disease with 40 to 50% stenosis in the right internal carotid and 1 at 39% in the left.  She had normal subclavian and vertebral flow.  She over the past year, she was seen in September 2019 by Fabian Sharp, PA and was noting  some intermittent leg swelling for which she was taking Lasix as needed approximately 2-3 times per week and also using support stockings.    I saw her on May 09, 2018 presently, at which time she had noticed a rare episode of heart rate irregularity.  She denied any chest pain or shortness of breath.  She continued to be on amlodipine 5 mg,irbesartan 150 mg, furosemide 20 mg for which he takes it 2-3 times per week, Toprol-XL 100 mg for hypertension and heart rate.  She was on warfarin anticoagulation.  GERD has been controlled with pantoprazole.   Her ECG in the office confirmed that she was back in atrial fibrillation.  Her ventricular rate was 87.  During that evaluation I discontinued amlodipine and substituted this with Cardizem CD 180 mg.  Laboratory was obtained.  She underwent a one-year follow-up echo Doppler study  on May 19, 2018.  At the time of her echo evaluation she was still in atrial fibrillation.  However, LV function remained normal.  Her TAVR valve was stable and actually gradients appeared slightly less than previously.   She was  evaluated by me in April 2020 and a telemedicine visit.  She believes her heart rhythm has stabilized.  Obviously she has not had a follow-up EKG.  Her pulse was running fairly regularly in the 60s.  She does note almost daily ankle swelling and she has increased her furosemide to 20 mg daily.  She continued to use support stockings.  She denied chest pain,recurrent palpitations, presyncope or syncope.    I saw her in August 2020  She has had purposeful weight loss of approximately 20 pounds.  She continued to be on diltiazem 180 mg, irbesartan 150 mg, Toprol-XL 100 mg for blood pressure and rate control.  She was taking furosemide as needed 2 times per week 20 mg.  She was on rosuvastatin for hyperlipidemia.  She continued to be on warfarin for anticoagulation.  During that evaluation, her ECG demonstrated that she was in atrial fibrillation with a ventricular rate at 67 bpm and had left bundle branch block with repolarization changes.  She was unaware of her atrial fibrillation.  During that evaluation I initiated amiodarone 200 mg daily.  Subsequent laboratory was obtained.  Since I saw her she has been divided by her primary physician Dr. Anastasio Champion.  I saw her in November 2020.  At that time she was unaware of any abnormal rhythm.  She denied chest pain PND orthopnea.  She denied any dizziness.  She had noted some trivial ankle swelling.  Her INR was therapeutic at 2.6.  Her EKG at that time revealed that she had converted to sinus rhythm.  Due to mild blood pressure elevation and ankle edema I suggested that she increase her Lasix from 2 times per week and take this every other day.  She underwent a follow-up echo Doppler studies on June 22, 2019.  This revealed an EF of 60 to 65% with moderate LVH.  There was moderately elevated pulmonary artery systolic pressure with estimated right ventricular systolic pressure at 58 mmHg. Her TAVR 26 mm sapient 3 valve was stable with stable gradients with a mean  gradient of 5.3 and peak gradient of 11.6.  In June 2021 she underwent carotid duplex imaging which showed right ICA velocities consistent with a 40 to 59% stenosis in the left ICA consistent with a 1 to 39% stenosis.  I  saw her in November 2021.  At that time she  denied any chest pain.  Her blood pressure at home typically was running around 145/64.  At times she develops  episodes of apparent recurrent atrial fibrillation and believes she had an episode 2 days previously that lasted for 30 minutes.  She has only been taking furosemide every third day.  She continues to be on amiodarone 200 mg daily, diltiazem 180 mg, irbesartan 150 mg and metoprolol succinate 100 mg daily.  She is on rosuvastatin for hyperlipidemia and takes 90 mg of thyroid pill.  She continues to be on warfarin for anticoagulation.  During that evaluation, she was significantly hypertensive with blood pressure initially at 198/82 and repeat 188/80.  She was bradycardic with heart rate at 47.  With her bradycardia I decrease diltiazem to 120 mg and recommended she undergo renal duplex imaging in light of her peripheral vascular disease to make certain she did not have a renal vascular etiology such as renal artery stenosis contributing to her accelerated hypertension.  During that evaluation I recommended further titration of irbesartan to 300 mg and recommended discontinuance of furosemide since she had been taking this only every third day and started her on chlorthalidone 12.5 mg daily.   She has subsequently been seen by Joslyn Hy, Pharm.D. metoprolol dose has been reduced to 50 mg and later down to 25 mg due to bradycardia.  Laboratory on March 08, 2020 showed increased creatinine at 1.7.  She also has been taking meloxicam several days per week which was discontinued and she was advised hydration.  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  At  her last visit with Joslyn Hy, her creatinine had improved to 1.49 she was started on hydralazine 25 mg twice a day.  I last saw her on April 29, 2020.  At that time she felt well and was on a regimen  of amiodarone 20 mg daily, chlorthalidone 12.5 mg, diltiazem 120 mg, irbesartan 300 mg, metoprolol succinate 25 mg.  She denied any swelling.  She states she takes meloxicam 1-2 times per week depending upon her arthritic symptoms.  Over the past month she is unaware of any recurrent atrial fibrillation.  She continues to be on rosuvastatin 20 mg and LDL cholesterol in October 2021 was 57.    She was diagnosed with lung cancer and underwent right upper lobectomy with lymph node excision on August 15, 2020.  Since I last saw her, she was evaluated by Maximino Greenland, PA-C in October 2022 she was unaware of any recurrent atrial fibrillation and remained on amiodarone, beta-blocker, diltiazem, and warfarin for stroke prevention.  Was recommended that she have a follow-up echo Doppler study to reassess her TAVR.  She had carotid artery disease mild on the left and moderate on the right and was followed by Dr. Lita Mains for her aortobifemoral bypass.  She underwent follow-up ABIs on May 05, 2021 and had normal ABIs bilaterally.  She was hospitalized on April 21 through June 27, 2021.  She was in atrial fibrillation and was on amiodarone and Toprol for rate control.  Presently, she monitors her Coumadin at home with an INR monitor.  Of note when she was recently in the hospital I did not see an ECG done.  2 days ago her INR was supratherapeutic and she was told to hold warfarin for at least 2 days.  She underwent  Past Medical History:  Diagnosis Date   Allergy  rhinitis   Aortic stenosis, severe    a. s/p TAVR 08/2014.   Arthritis    spine and various joints   Cancer of upper lobe of right lung ([place]) 09/04/2020   Carotid artery disease ([place])    Cataract    Cerebrovascular disease 12/05/2018    Childhood asthma    Chronic bronchitis ([place])        Chronic kidney disease 02/17/2021   Claudication ([place])    COPD GOLD II 07/18/2020   Coronary artery disease    a. April 2012 which revealed mid RCA occlusion with collaterals, 50% LAD stenosis, 30% circumflex stenosis, and 40% marginal stenosis   First degree AV block    GERD (gastroesophageal reflux disease)    Heart murmur    Hyperlipidemia    Hypertension    Hypothyroidism 11/14/2020   Left bundle branch block    LUMBAR RADICULOPATHY, RIGHT    PAF (paroxysmal atrial fibrillation) ([place])    Peripheral vascular disease ([place])    a. s/p aortobifem bypass graft surgery and bilateral fundoplasty by [facility].   Renal artery stenosis ([place])    a. renal artery stenosis (>60% L renal artery) by duplex 04/2020.   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 bilateral subclavian stenosis with antegrade flow by duplex 07/2020    Past Surgical History:  Procedure Laterality Date   AORTO-FEMORAL BYPASS GRAFT Bilateral 06/27/10   BASAL CELL CARCINOMA EXCISION Left    face   CARDIAC CATHETERIZATION N/A 07/25/2014   Procedure: Right/Left Heart Cath and Coronary Angiography;  Surgeon: Troy Sine, MD;  Location: [facility];  Service: Cardiovascular;  Laterality: N/A;   CATARACT EXTRACTION, BILATERAL Bilateral 09/29/2016   COLONOSCOPY  2008   Dr. Laural Golden: normal.    DILATION AND CURETTAGE OF UTERUS  "2 or 3"   ESOPHAGEAL DILATION N/A 06/26/2021   Procedure: ESOPHAGEAL DILATION;  Surgeon: Daneil Dolin, MD;  [facility];  Service: Endoscopy;  Laterality: N/A;   ESOPHAGOGASTRODUODENOSCOPY N/A 08/18/2017   Dr. Gala Romney: Normal-appearing esophagus status post  dilation.  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  Small hiatal hernia.   ESOPHAGOGASTRODUODENOSCOPY (EGD) WITH PROPOFOL N/A 06/26/2021   Procedure: ESOPHAGOGASTRODUODENOSCOPY (EGD) WITH PROPOFOL;  Surgeon: Daneil Dolin, MD;  [facility];  Service: Endoscopy;  Laterality: N/A;   INTERCOSTAL NERVE BLOCK Right 08/15/2020   Procedure: INTERCOSTAL NERVE BLOCK RIGHT;  Surgeon: Melrose Nakayama, MD;  Location: [facility];  Service: Thoracic;  Laterality: Right;   IR THORACENTESIS ASP PLEURAL SPACE W/IMG GUIDE  10/07/2020   MALONEY DILATION N/A 08/18/2017   Procedure: Venia Minks DILATION;  Surgeon: Daneil Dolin, MD;  [facility];  Service: Endoscopy;  Laterality: N/A;   MULTIPLE TOOTH EXTRACTIONS     NODE DISSECTION Right 08/15/2020   Procedure: NODE DISSECTION;  Surgeon: Melrose Nakayama, MD;  Location: [facility];  Service: Thoracic;  Laterality: Right;   PORTACATH PLACEMENT Left 01/15/2021   Procedure: INSERTION PORT-A-CATH;  Surgeon: Aviva Signs, MD;  Location: [facility];  Service: General;  Laterality: Left;   PR VEIN BYPASS GRAFT,AORTO-FEM-POP  06/12/10   TEE WITHOUT CARDIOVERSION N/A 08/31/2014   Procedure: TRANSESOPHAGEAL ECHOCARDIOGRAM (TEE);  Surgeon: Rexene Alberts, MD;  Location: [facility];  Service: Open Heart Surgery;  Laterality: N/A;   TRANSCATHETER AORTIC VALVE REPLACEMENT, TRANSFEMORAL N/A 08/31/2014   Procedure: TRANSCATHETER AORTIC VALVE REPLACEMENT, TRANSFEMORAL approach;  Surgeon: Rexene Alberts, MD;  Location: [facility];  Service: Open Heart Surgery;  Laterality: N/A;   TUBAL LIGATION  1970's   VAGINAL HYSTERECTOMY  1970's   Partial     Allergies  Allergen Reactions   Adhesive [Tape] Rash   Latex Rash   Tetanus Toxoids Rash   Wound Dressing Adhesive Rash    Current Outpatient Medications  Medication Sig Dispense Refill   acetaminophen (TYLENOL) 500 MG tablet Take 500 mg by mouth every 6 (six) hours as needed for moderate pain.     albuterol (VENTOLIN HFA)  108 (90 Base) MCG/ACT inhaler Inhale 2 puffs into the lungs 2 (two) times daily as needed for wheezing or shortness of breath. 8 g 2   estradiol (ESTRACE) 2 MG tablet Take 1 tablet (2 mg total) by mouth daily. 90 tablet 0   furosemide (LASIX) 20 MG tablet Take 1 tablet (20 mg total) by mouth daily. 30 tablet 4   ipratropium (ATROVENT) 0.06 % nasal spray USE 1 SPRAY(S) IN EACH NOSTRIL EVERY 6 HOURS AS NEEDED FOR RUNNY NOSE (Patient taking differently: Place 1 spray into both nostrils every 6 (six) hours as needed for rhinitis.) 15 mL 2   irbesartan (AVAPRO) 300 MG tablet Take 0.5 tablets (150 mg total) by mouth daily. 90 tablet 0   levothyroxine (SYNTHROID) 50 MCG tablet Take 1 tablet (50 mcg total) by mouth daily before breakfast. 30 tablet 3   lidocaine-prilocaine (EMLA) cream Apply a small amount to port a cath site and cover with plastic wrap 1 hour prior to infusion appointments 30 g 3   metoCLOPramide (REGLAN) 5 MG tablet Take 2 tablets (10 mg total) by mouth every 8 (eight) hours as needed for nausea, vomiting or refractory nausea / vomiting. 30 tablet 3   metoprolol succinate (TOPROL-XL) 25 MG 24 hr tablet Take 1 tablet by mouth once daily (Patient taking differently: Take 25 mg by mouth daily.) 90 tablet 0   nitroGLYCERIN (NITROSTAT) 0.4 MG SL tablet Place 0.4 mg under the tongue every 5 (five) minutes as needed for chest pain.     nystatin (MYCOSTATIN) 100000 UNIT/ML suspension nystatin 100,000 unit/mL oral suspension  TAKE 5 ML BY MOUTH 4 TIMES DAILY     oxyCODONE (OXY IR/ROXICODONE) 5 MG immediate release tablet Take 1 tablet (5 mg total) by mouth at bedtime as needed for severe pain. 30 tablet 0   pantoprazole (PROTONIX) 40 MG tablet TAKE 1 TABLET BY MOUTH ONCE DAILY BEFORE SUPPER (Patient taking differently: Take 40 mg by mouth daily.) 90 tablet 3   predniSONE (DELTASONE) 20 MG tablet Take 1 tablet (20 mg total) by mouth daily with breakfast. 60 tablet 0   prochlorperazine (COMPAZINE) 10  MG tablet Take 1 tablet (10 mg total) by mouth every 6 (six) hours as needed (Nausea or vomiting). 30 tablet 1   rosuvastatin (CRESTOR) 20 MG tablet Take 1 tablet (20 mg total) by mouth at bedtime. 90 tablet 0   warfarin (COUMADIN) 2.5 MG tablet Take 1 tablet (2.5 mg total) by mouth at bedtime.     amiodarone (PACERONE) 200 MG tablet Take 1 tablet (200 mg total) by mouth 2 (two) times daily. 60 tablet 6   No current facility-administered medications for this visit.    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 Retired,used to work at [employer].Lives alone.         Patient has one daughter, 3 grands   Likes to crochet   Works in [employer].    Social Determinants of Health   Financial Resource Strain: Not on file  Food Insecurity: No Food Insecurity   Worried About Charity fundraiser in the Last Year: Never true   Ran Out of Food in the Last Year: Never true  Transportation Needs: No Transportation Needs   Lack of Transportation (Medical): No   Lack of Transportation (Non-Medical): No  Physical Activity: Not on file  Stress: Not on file  Social Connections: Not on file  Intimate Partner Violence: Not on file    Family History  Problem Relation Age of Onset   Heart disease  Mother 75       died young   Varicose Veins Mother    Stroke Father    Hypertension Father    Heart disease Father 3       Aneurysm   Heart attack Father    Arthritis Brother    Stroke Brother    Diabetes Daughter    Arthritis Daughter    Heart attack Brother        unsure   Allergic rhinitis Neg Hx    Angioedema Neg Hx    Asthma Neg Hx    Atopy Neg Hx    Eczema Neg Hx    Immunodeficiency Neg Hx    Urticaria Neg Hx    Social history is notable that she is married has one child. She quit tobacco in April 2012 after a long-standing tobacco history having started smoking at age 42.   ROS General: Negative; No fevers, chills, or night sweats;  HEENT: Negative; No changes in vision or hearing, sinus congestion, difficulty swallowing Pulmonary: Negative; No cough, wheezing, shortness of breath, hemoptysis Cardiovascular: See history of present illness.   GI: Negative; No nausea, vomiting, diarrhea, or abdominal pain GU: Negative; No dysuria, hematuria, or difficulty voiding Musculoskeletal: Negative; no myalgias, joint pain, or weakness Hematologic/Oncology: Negative; no easy bruising, bleeding Endocrine: Negative; no heat/cold intolerance; no diabetes Neuro: Negative; no changes in balance, headaches Skin: Negative; No rashes or skin lesions Psychiatric: Negative; No behavioral problems, depression Sleep: Negative; No snoring, daytime sleepiness, hypersomnolence, bruxism, restless legs, hypnogognic hallucinations, no cataplexy Other comprehensive 14 point system review is negative.   PE BP 128/81   Pulse 91   Ht 5\' 10"  (1.778 m)   Wt 140 lb (63.5 kg)   SpO2 96%   BMI 20.09 kg/m  Repeat blood pressure by me was 124/74  Wt Readings from Last 3 Encounters:  07/23/21 140 lb (63.5 kg)  06/20/21 142 lb 6.7 oz (64.6 kg)  05/27/21 148 lb 8 oz (67.4 kg)     Physical Exam BP 128/81   Pulse 91   Ht 5\' 10"  (1.778 m)   Wt 140 lb (63.5 kg)   SpO2 96%   BMI 20.09  kg/m  General: Alert, oriented, no distress.  Skin: normal turgor, no rashes, warm and dry HEENT: Normocephalic, atraumatic. Pupils equal round and reactive to light; sclera anicteric; extraocular muscles intact;  Nose without nasal septal hypertrophy Mouth/Parynx benign; Mallinpatti scale Neck: No JVD, no carotid bruits; normal carotid upstroke Lungs: clear to ausculatation and percussion; no wheezing or rales Chest wall: without tenderness to palpitation Heart: PMI not displaced, irregularly irregular with a ventricular rate in the 90s consistent with atrial fibrillation, s1 s2 normal, 6-2/8 systolic murmur aortic area, no diastolic murmur, no rubs, gallops, thrills, or heaves Abdomen: soft, nontender; no hepatosplenomehaly, BS+; abdominal aorta nontender and not dilated by palpation. Back: no CVA tenderness Pulses 2+ Musculoskeletal: full range of motion, normal strength, no joint deformities Extremities: no clubbing cyanosis or edema, Homan's sign negative  Neurologic: grossly nonfocal; Cranial nerves grossly wnl Psychologic: Normal mood and affect   Jul 23, 2021 ECG (independently read by me): Atrial fibrillation at 91 bpm, LVH with QRS widening.  Left axis deviation.  April 29, 2020 ECG (independently read by me): Sinus bradycardia at 47; LBBB, 1st degree block, PR 222 msec   November 2021 ECG (independently read by me): Sinus Bradycardia at 47; 1st degree block; PR 228 msec; LBBB  November 2020 ECG (independently read by me): Sinus bradycardia 51 bpm, first-degree AV block, left bundle branch block with repolarization changes.  August 2020 ECG (independently read by me): Atrial Fibrillation at 67; LBBB with repolarization  March 2020 ECG (independently read by me): Atrial fibrillation with ventricular rate at 87.  Left bundle branch block with repolarization changes.  March 2019 ECG (independently read by me): sinus bradycardia 56 bpm.  Left bundle branch block.  PR interval  198 ms.  04/14/2017 ECG (independently read by me): Atrial fibrillation at 77 bpm left bundle branch block.  Left axis deviation.  QTc interval 520 ms  August 2018 ECG (independently read by me): Sinus bradycardia with occasional PACs.  Left axis deviation.  Borderline first-degree AV block with a PR interval of 22 ms.  Left bundle-branch block  March 2018 ECG (independently read by me): Sinus bradycardia 52 bpm.  First-degree AV block with a PR interval of 210 ms.  Left bundle branch block with repolarization changes.  PE the C.  January 2018 ECG (independently read by me): Atrial fibrillation with a rate at 91.  Left bundle-branch block.  December 2017 ECG (independently read by me): Sinus bradycardia 57 bpm.  Left bundle branch block with repolarization changes.  October 2017 ECG (independently read by me): Sinus rhythm at 74 bpm with frequent PVCs in a trigeminal pattern.  March 2017 ECG (independently read by me): Sinus bradycardia 54 bpm.  Left bundle branch block with repolarization changes.  November 2016 ECG (independently read by me): Sinus bradycardia 55 bpm with occasional premature atrial complex.  Left bundle-branch block.  February 2016 ECG (independently read by me):  Sinus bradycardia 57 bpm.  Left bundle branch block with repolarization changes..  No ectopy.  December 2015 ECG (independently read by me): Sinus bradycardia 58 bpm.  Mild  left atrial enlargement.  Left axis deviation.  Left bundle branch block with repolarization changes.  October 2014 ECG: Sinus bradycardia with left axis deviation. Left bundle branch block with repolarization changes  LAB    Latest Ref Rng & Units 06/27/2021    4:55 AM 06/26/2021    4:14 AM 06/25/2021    4:52 AM  BMP  Glucose 70 - 99 mg/dL 97   127   102    BUN 8 - 23 mg/dL 17   17   13     Creatinine 0.44 - 1.00 mg/dL 0.99   0.96   0.99    Sodium 135 - 145 mmol/L 140   139   139    Potassium 3.5 - 5.1 mmol/L 3.9   3.7   4.0     Chloride 98 - 111 mmol/L 104   106   104    CO2 22 - 32 mmol/L 29   27   30     Calcium 8.9 - 10.3 mg/dL 7.8   7.8   7.8        Latest Ref Rng & Units 06/25/2021    4:52 AM 06/24/2021    4:59 AM 06/22/2021    6:26 AM  Hepatic Function  Albumin 3.5 - 5.0 g/dL 2.2   2.3   2.2        Latest Ref Rng & Units 06/27/2021    4:55 AM 06/26/2021    4:14 AM 06/25/2021    4:52 AM  CBC  WBC 4.0 - 10.5 K/uL 11.9   15.3   14.1    Hemoglobin 12.0 - 15.0 g/dL 11.7   12.2   12.4    Hematocrit 36.0 - 46.0 % 37.4   38.4   39.0    Platelets 150 - 400 K/uL 108   110   111     Lab Results  Component Value Date   MCV 92.1 06/27/2021   MCV 91.0 06/26/2021   MCV 91.8 06/25/2021   Lab Results  Component Value Date   TSH 1.797 06/12/2021   Lipid Panel     Component Value Date/Time   CHOL 133 12/14/2019 1317   CHOL 126 11/21/2018 1146   TRIG 105 12/14/2019 1317   HDL 57 12/14/2019 1317   HDL 51 11/21/2018 1146   CHOLHDL 2.3 12/14/2019 1317   VLDL 40 (H) 01/16/2015 1028   LDLCALC 57 12/14/2019 1317     RADIOLOGY: No results found.  ECHO: 06/22/2019 IMPRESSIONS   1. Left ventricular ejection fraction, by estimation, is 60 to 65%. The  left ventricle has normal function. The left ventricle has no regional  wall motion abnormalities. There is moderate left ventricular hypertrophy.  Left ventricular diastolic  parameters are indeterminate.   2. Right ventricular systolic function is normal. The right ventricular  size is normal. There is moderately elevated pulmonary artery systolic  pressure.   3. Left atrial size was moderately dilated.   4. The mitral valve is normal in structure. Mild mitral valve  regurgitation. No evidence of mitral stenosis.   5. Post TAVR with 26 mm Sapien 3 valve stable gradients since 05/19/18 and  no PVL. The aortic valve has been repaired/replaced. Aortic valve  regurgitation is not visualized. No aortic stenosis is present.   6. The inferior vena cava is normal  in size with greater than 50%  respiratory variability, suggesting right atrial pressure of 3 mmHg.   FINDINGS   Left Ventricle: Left ventricular ejection fraction, by  estimation, is 60  to 65%. The left ventricle has normal function. The left ventricle has no  regional wall motion abnormalities. The left ventricular internal cavity  size was normal in size. There is   moderate left ventricular hypertrophy. Left ventricular diastolic  parameters are indeterminate.   Right Ventricle: The right ventricular size is normal. No increase in  right ventricular wall thickness. Right ventricular systolic function is  normal. There is moderately elevated pulmonary artery systolic pressure.  The tricuspid regurgitant velocity is  3.46 m/s, and with an assumed right atrial pressure of 10 mmHg, the  estimated right ventricular systolic pressure is 36.6 mmHg.   Left Atrium: Left atrial size was moderately dilated.   Right Atrium: Right atrial size was normal in size.   Pericardium: There is no evidence of pericardial effusion.   Mitral Valve: The mitral valve is normal in structure. There is moderate  thickening of the mitral valve leaflet(s). There is moderate calcification  of the mitral valve leaflet(s). Normal mobility of the mitral valve  leaflets. Moderate mitral annular  calcification. Mild mitral valve regurgitation. No evidence of mitral  valve stenosis. MV peak gradient, 17.5 mmHg. The mean mitral valve  gradient is 3.0 mmHg.   Tricuspid Valve: The tricuspid valve is normal in structure. Tricuspid  valve regurgitation is mild . No evidence of tricuspid stenosis.   Aortic Valve: Post TAVR with 26 mm Sapien 3 valve stable gradients since  05/19/18 and no PVL. The aortic valve has been repaired/replaced. Aortic  valve regurgitation is not visualized. No aortic stenosis is present.  Aortic valve mean gradient measures 5.3   mmHg. Aortic valve peak gradient measures 11.6 mmHg.    Pulmonic Valve: The pulmonic valve was normal in structure. Pulmonic valve  regurgitation is not visualized. No evidence of pulmonic stenosis.   Aorta: The aortic root is normal in size and structure.   Venous: The inferior vena cava is normal in size with greater than 50%  respiratory variability, suggesting right atrial pressure of 3 mmHg.   IAS/Shunts: No atrial level shunt detected by color flow Doppler.      LEFT VENTRICLE  PLAX 2D  LVIDd:         3.80 cm Diastology  LVIDs:         2.70 cm LV e' lateral:   4.70 cm/s  LV PW:         1.10 cm LV E/e' lateral: 34.3  LV IVS:        1.60 cm LV e' medial:    4.62 cm/s                         LV E/e' medial:  34.8      RIGHT VENTRICLE  RV Basal diam:  2.30 cm  RV S prime:     9.73 cm/s  TAPSE (M-mode): 1.5 cm  RVSP:           55.9 mmHg   LEFT ATRIUM             Index       RIGHT ATRIUM           Index  LA diam:        4.80 cm 2.42 cm/m  RA Pressure: 8.00 mmHg  LA Vol (A2C):   59.0 ml 29.80 ml/m RA Area:     13.60 cm  LA Vol (A4C):   31.2 ml 15.76 ml/m RA Volume:   28.50  ml  14.40 ml/m  LA Biplane Vol: 46.0 ml 23.23 ml/m   AORTIC VALVE  AV Vmax:           170.33 cm/s  AV Vmean:          107.633 cm/s  AV VTI:            0.427 m  AV Peak Grad:      11.6 mmHg  AV Mean Grad:      5.3 mmHg  LVOT Vmax:         117.00 cm/s  LVOT Vmean:        78.700 cm/s  LVOT VTI:          0.315 m  LVOT/AV VTI ratio: 0.74   MITRAL VALVE                TRICUSPID VALVE  MV Area (PHT): 3.85 cm     TR Peak grad:   47.9 mmHg  MV Peak grad:  17.5 mmHg    TR Vmax:        346.00 cm/s  MV Mean grad:  3.0 mmHg     Estimated RAP:  8.00 mmHg  MV Vmax:       2.09 m/s     RVSP:           55.9 mmHg  MV Vmean:      78.9 cm/s  MV Decel Time: 197 msec     SHUNTS  MR Peak grad: 77.4 mmHg     Systemic VTI: 0.32 m  MR Mean grad: 35.0 mmHg  MR Vmax:      440.00 cm/s  MR Vmean:     254.0 cm/s  MV E velocity: 161.00 cm/s  MV A velocity: 71.50 cm/s  MV  E/A ratio:  2.25    IMPRESSION: 1. Persistent atrial fibrillation ([place])   2. Alteration in anticoagulation   3. S/P TAVR (transcatheter aortic valve replacement)   4. Stage 3b chronic kidney disease ([place])   5. Hyperlipidemia with target LDL less than 70   6. Malignant neoplasm of upper lobe of right lung ([place])   7. PVD (peripheral vascular disease) ([place])      ASSESSMENT AND PLAN: Katelyn [name] is a 7 -year-old female who has documented CAD and PVD who developed progressive aortic valve stenosis and underwent successful TAVR procedure on 08/31/2014 with placement of a 26 mm Edward Sapien 3 Valve from the right femoral approach.  She later developed stage II hypertension resulting in increased medical regimen.   She developed postoperative atrial fibrillation and previously converted back to sinus rhythm on her increased beta blocker dose and has been on warfarin for anticoagulation. An echo Doppler study in July 2018 continued to show normal systolic function.  Her bioprosthetic TAVR valve was functioning well without mobility restriction.  The mean gradient was 10 with a peak gradient of 21.  When I saw her on 04/14/2017, her ECG demonstrated recurrent atrial fibrillation of questionable duration and metoprolol succinate was further titrated titrated up to 100 mg daily.  An echo Doppler study in March 2019 revealed normal systolic function, and grade 2 diastolic dysfunction.  Her bioprosthetic valve was functioning well.  The mean gradient was 12 and peak gradient 25.  There was no aortic insufficiency.  PA peak pressure was 34 mm.  Over the last several years she has had recurrent episodes of paroxysmal atrial fibrillation and in 2020 amiodarone was initiated.  When she was evaluated in November 2021  she had noticed 2 brief episodes of atrial fibrillation.  She was significantly hypertensive.  Medications were adjusted.  Due to bradycardia her diltiazem dose was reduced, irbesartan was increased and  chlorthalidone 12.5 mg was added.  She continued to be on low-dose metoprolol succinate.  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  She had developed slight increase in creatinine at the time she was more frequently taking meloxicam.  At her last evaluation with me in the office she was maintaining sinus rhythm and was on a regimen of amiodarone 200 mg daily chlorthalidone, diltiazem 120 mg, hydralazine 25 mg twice a day, irbesartan 300 mg daily in addition to metoprolol succinate.  Since I last saw her, she was noted on ECG in July 2022 at [facility] to be in probable atrial fibrillation.  Presently, her ECG continues to show atrial fibrillation and with her rate increased at 91 bpm I am increasing her dose to 200 mg twice a day.  I am scheduling her for follow-up echo Doppler study for reassessment of LV systolic and diastolic function as well as her TAVR.  I am rechecking comprehensive metabolic panel, magnesium, TSH, and CBC.  She will recheck her iron a tomorrow and most likely reinstitute warfarin.  She is followed by Dr. Trula Slade for her PVD and I reviewed her ABIs.  I reviewed her most recent hospitalization records.  She had recently developed oral thrush extending into her esophagus required antifungal therapy.  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  I have recommended a follow-up office visit with our APP in 4 to 6 weeks with follow-up with me in 3 months.   Over the last several months medications have been adjusted and she is now on chlorthalidone 12.5 mg, diltiazem 120 mg, hydralazine 25 mg twice a day, irbesartan 300 mg daily in addition to metoprolol succinate 25 mg.  Blood pressure today is now controlled and a repeat by me was 124/74.  She is bradycardic but heart rate has improved and is now 55 compared to previously.  She has left bundle branch block which is chronic.   There is evidence for first-degree AV block.  Most recent creatinine was 1.49.  She continues to be on rosuvastatin 20 mg with most recent LDL cholesterol at 57.  Again recommended she try to avoid taking meloxicam as much as possible.  She is to stay hydrated.  She will monitor her blood pressure.  At present she will continue her current regimen.  However if blood pressure increases further titration of hydralazine can be done.  I will see her in 6 months for follow-up evaluation or sooner as needed.   Troy Sine, MD, [facility]  07/30/2021 11:25 AM

## 2021-07-30 ENCOUNTER — Encounter: Payer: Self-pay | Admitting: Cardiovascular Disease

## 2021-07-30 ENCOUNTER — Ambulatory Visit (INDEPENDENT_AMBULATORY_CARE_PROVIDER_SITE_OTHER): Payer: Medicare Other | Admitting: Cardiology

## 2021-07-30 ENCOUNTER — Ambulatory Visit (HOSPITAL_COMMUNITY): Admission: RE | Admit: 2021-07-30 | Payer: Medicare Other | Source: Ambulatory Visit

## 2021-07-30 DIAGNOSIS — Z7901 Long term (current) use of anticoagulants: Secondary | ICD-10-CM

## 2021-07-30 LAB — POCT INR: INR: 2.7 (ref 2.0–3.0)

## 2021-08-01 ENCOUNTER — Ambulatory Visit (HOSPITAL_COMMUNITY)
Admission: RE | Admit: 2021-08-01 | Discharge: 2021-08-01 | Disposition: A | Discharge status: Home / Self Care | Payer: Medicare Other | Source: Ambulatory Visit | Attending: Cardiovascular Disease | Admitting: Cardiovascular Disease

## 2021-08-01 DIAGNOSIS — I6523 Occlusion and stenosis of bilateral carotid arteries: Secondary | ICD-10-CM | POA: Diagnosis not present

## 2021-08-04 ENCOUNTER — Inpatient Hospital Stay (HOSPITAL_BASED_OUTPATIENT_CLINIC_OR_DEPARTMENT_OTHER): Payer: Medicare Other | Admitting: Hematology

## 2021-08-04 ENCOUNTER — Inpatient Hospital Stay (HOSPITAL_COMMUNITY): Payer: Medicare Other | Attending: Hematology

## 2021-08-04 VITALS — BP 129/76 | HR 87 | Temp 97.6°F | Resp 20 | Wt 135.5 lb

## 2021-08-04 DIAGNOSIS — Z79899 Other long term (current) drug therapy: Secondary | ICD-10-CM | POA: Diagnosis not present

## 2021-08-04 DIAGNOSIS — R110 Nausea: Secondary | ICD-10-CM | POA: Insufficient documentation

## 2021-08-04 DIAGNOSIS — E039 Hypothyroidism, unspecified: Secondary | ICD-10-CM | POA: Diagnosis not present

## 2021-08-04 DIAGNOSIS — C3411 Malignant neoplasm of upper lobe, right bronchus or lung: Secondary | ICD-10-CM

## 2021-08-04 LAB — COMPREHENSIVE METABOLIC PANEL
ALT: 35 U/L (ref 0–44)
AST: 25 U/L (ref 15–41)
Albumin: 3.1 g/dL — ABNORMAL LOW (ref 3.5–5.0)
Alkaline Phosphatase: 97 U/L (ref 38–126)
Anion gap: 5 (ref 5–15)
BUN: 23 mg/dL (ref 8–23)
CO2: 29 mmol/L (ref 22–32)
Calcium: 8.2 mg/dL — ABNORMAL LOW (ref 8.9–10.3)
Chloride: 104 mmol/L (ref 98–111)
Creatinine, Ser: 0.98 mg/dL (ref 0.44–1.00)
GFR, Estimated: 59 mL/min — ABNORMAL LOW (ref >60)
Glucose, Bld: 109 mg/dL — ABNORMAL HIGH (ref 70–99)
Potassium: 4.3 mmol/L (ref 3.5–5.1)
Sodium: 138 mmol/L (ref 135–145)
Total Bilirubin: 0.8 mg/dL (ref 0.3–1.2)
Total Protein: 5.9 g/dL — ABNORMAL LOW (ref 6.5–8.1)

## 2021-08-04 LAB — CBC WITH DIFFERENTIAL/PLATELET
Abs Immature Granulocytes: 0.07 10*3/uL (ref 0.00–0.07)
Basophils Absolute: 0 10*3/uL (ref 0.0–0.1)
Basophils Relative: 0 %
Eosinophils Absolute: 0.1 10*3/uL (ref 0.0–0.5)
Eosinophils Relative: 1 %
HCT: 48.8 % — ABNORMAL HIGH (ref 36.0–46.0)
Hemoglobin: 14.9 g/dL (ref 12.0–15.0)
Immature Granulocytes: 1 %
Lymphocytes Relative: 8 %
Lymphs Abs: 0.7 10*3/uL (ref 0.7–4.0)
MCH: 29 pg (ref 26.0–34.0)
MCHC: 30.5 g/dL (ref 30.0–36.0)
MCV: 94.9 fL (ref 80.0–100.0)
Monocytes Absolute: 0.7 10*3/uL (ref 0.1–1.0)
Monocytes Relative: 7 %
Neutro Abs: 7.6 10*3/uL (ref 1.7–7.7)
Neutrophils Relative %: 83 %
Platelets: 108 10*3/uL — ABNORMAL LOW (ref 150–400)
RBC: 5.14 MIL/uL — ABNORMAL HIGH (ref 3.87–5.11)
RDW: 21.9 % — ABNORMAL HIGH (ref 11.5–15.5)
WBC: 9.2 10*3/uL (ref 4.0–10.5)
nRBC: 0 % (ref 0.0–0.2)

## 2021-08-04 LAB — MAGNESIUM: Magnesium: 1.9 mg/dL (ref 1.7–2.4)

## 2021-08-04 LAB — TSH: TSH: 3.747 u[IU]/mL (ref 0.350–4.500)

## 2021-08-04 MED ORDER — PROCHLORPERAZINE MALEATE 10 MG PO TABS: 10.0000 mg | ORAL_TABLET | Freq: Four times a day (QID) | ORAL | 3 refills | Status: AC | PRN

## 2021-08-04 MED ORDER — METOCLOPRAMIDE HCL 5 MG PO TABS: 10.0000 mg | ORAL_TABLET | Freq: Three times a day (TID) | ORAL | 3 refills | Status: DC | PRN

## 2021-08-04 MED ORDER — MEGESTROL ACETATE 400 MG/10ML PO SUSP: 400.0000 mg | Freq: Two times a day (BID) | ORAL | 3 refills | Status: DC

## 2021-08-04 MED ORDER — PREDNISONE 20 MG PO TABS: 20.0000 mg | ORAL_TABLET | Freq: Every day | ORAL | 0 refills | Status: DC

## 2021-08-04 NOTE — Patient Instructions (Addendum)
[place] at [facility] Discharge Instructions  You were seen and examined today by Dr. Delton Coombes.  Dr. Delton Coombes discussed your most recent lab work and your blood counts look good. Still waiting on the other labs. We will call if there is any abnormalities.  Dr. Delton Coombes sent in megace to the pharmacy to see if it will help your appetite.   We will get you scheduled for CT scan of your chest.  Follow-up as scheduled after CT scan.    Thank you for choosing [place] at [facility] to provide your oncology and hematology care.  To afford each patient quality time with our provider, please arrive at least 15 minutes before your scheduled appointment time.   If you have a lab appointment with the [facility] please come in thru the Main Entrance and check in at the main information desk.  You need to re-schedule your appointment should you arrive 10 or more minutes late.  We strive to give you quality time with our providers, and arriving late affects you and other patients whose appointments are after yours.  Also, if you no show three or more times for appointments you may be dismissed from the clinic at the providers discretion.     Again, thank you for choosing [facility].  Our hope is that these requests will decrease the amount of time that you wait before being seen by our physicians.       _____________________________________________________________  Should you have questions after your visit to [facility], please contact our office at [phone] and follow the prompts.  Our office hours are 8:00 a.m. and 4:30 p.m. Monday - Friday.  Please note that voicemails left after 4:00 p.m. may not be returned until the following business day.  We are closed weekends and major holidays.  You do have access to a nurse 24-7, just call the main number to the clinic [phone] and do not press any options, hold  on the line and a nurse will answer the phone.    For prescription refill requests, have your pharmacy contact our office and allow 72 hours.    Due to Covid, you will need to wear a mask upon entering the hospital. If you do not have a mask, a mask will be given to you at the Main Entrance upon arrival. For doctor visits, patients may have 1 support person age 90 or older with them. For treatment visits, patients can not have anyone with them due to social distancing guidelines and our immunocompromised population.

## 2021-08-04 NOTE — Progress Notes (Signed)
Katelyn [name], [place] [address]   CLINIC:  Medical Oncology/Hematology  PCP:  Celene Squibb, MD [address] [name] [address] [phone]   REASON FOR VISIT:  Follow-up for right lung cancer  PRIOR THERAPY: Right upper lobectomy (08/15/20)  NGS Results: not done  CURRENT THERAPY: Carboplatin, Taxol, and Keytruda every 3 weeks x 4 cycles; to be followed by Beryle Flock maintenance every 3 weeks  BRIEF ONCOLOGIC HISTORY:  Oncology History Overview Note  Foundation One PD-L1 TPS score 1 16 mut/MB KRASG12C mutation MSI stable   Cancer of upper lobe of right lung ([facility])  08/15/2020 Pathology Results   FINAL MICROSCOPIC DIAGNOSIS:   A. 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  - Tumor is limited to lung.  - Margins are negative for carcinoma.  - One of one lymph nodes negative for carcinoma (0/1).  - See oncology table.   B. LYMPH NODE, 7, EXCISION:  - One of one lymph nodes negative for carcinoma (0/1).   C. LYMPH NODE, 7 #2, EXCISION:  - One of one lymph nodes negative for carcinoma (0/1).   D. LYMPH NODE, 9, EXCISION:  - One of one lymph nodes negative for carcinoma (0/1).   E. LYMPH NODE, 8, EXCISION:  - One of one lymph nodes negative for carcinoma (0/1).   F. LYMPH NODE, 7 #3, EXCISION:  - One of one lymph nodes negative for carcinoma (0/1).   G. LYMPH NODE, 11, EXCISION:  - One of one lymph nodes negative for carcinoma (0/1).   H. LYMPH NODE, 10, EXCISION:  - One of one lymph nodes negative for carcinoma (0/1).   I. LYMPH NODE, 4R, EXCISION:  - One of one lymph nodes negative for carcinoma (0/1).   J. LYMPH NODE, 10 #2, EXCISION:   - One of one lymph nodes negative for carcinoma (0/1).  - Fibrosis and calcifications.   K. LYMPH NODE, 12, EXCISION:  - One of one lymph nodes negative for carcinoma (0/1).   L. LYMPH NODE, 12 #2, EXCISION:  - One of one  lymph nodes negative for carcinoma (0/1).   ONCOLOGY TABLE:   LUNG: Resection   Synchronous Tumors: Not applicable  Total Number of Primary Tumors: 1  Procedure: Right upper lobe lobectomy with lymph node biopsies  Specimen Laterality: Right  Tumor Focality: Unifocal  Tumor Site: Right upper lobe  Tumor Size: 4.4 cm  Histologic Type: Large cell neuroendocrine carcinoma  Visceral Pleura Invasion: Not identified  Direct Invasion of Adjacent Structures: No adjacent structures present  Lymphovascular Invasion: Not identified  Margins: All margins negative for invasive carcinoma       Closest Margin(s) to Invasive Carcinoma: 1.5 cm to bronchial margin  Treatment Effect: No known presurgical therapy  Regional Lymph Nodes:       Number of Lymph Nodes Involved: 0                            Nodal Sites with Tumor: Not applicable       Number of Lymph Nodes Examined: 12                       Nodal Sites Examined: 4, 7, 8, 9, 10, 11, 12  Distant Metastasis:       Distant Site(s) Involved: Not applicable  Pathologic Stage Classification (pTNM, AJCC 8th  Edition): pT2b, pN0  Ancillary Studies: Can be performed upon request  Representative Tumor Block: A2  Comment(s): The tumor has a subtle neuroendocrine features, cribriform areas with necrosis, and geographic areas of necrosis.  Immunohistochemistry is positive for CD56, synaptophysin, and TTF-1.  Chromogranin is largely negative. Ki-67 is elevated. Dr. Saralyn Pilar has reviewed the case.     08/15/2020 Surgery   PREOPERATIVE DIAGNOSIS:  Right upper lobe lung mass, suspected non-small cell carcinoma.   POSTOPERATIVE DIAGNOSIS:  Non-small cell carcinoma, right upper lobe, clinical stage IB (T2, N0).   PROCEDURE:   Xi robotic-assisted right upper lobectomy,  Lymph node dissection,  Intercostal nerve blocks levels 3 through 10.   SURGEON:  Modesto Charon, MD    FINDINGS:  Fragile tissue.  Enlarged but otherwise benign appearing nodes.   One calcified node.  Frozen section revealed non-small cell carcinoma.  Bronchial margin clear.   09/04/2020 Initial Diagnosis   Cancer of upper lobe of right lung ([facility])   09/04/2020 Cancer Staging   Staging form: Lung, AJCC 8th Edition - Pathologic stage from 09/04/2020: pT2, pN0, cM0 - Signed by Heath Lark, MD on 09/04/2020 Stage prefix: Initial diagnosis    09/11/2020 Genetic Testing   PD-L1 Results:     12/11/2020 Cancer Staging   Staging form: Lung, AJCC 8th Edition - Pathologic stage from 12/11/2020: Stage IVB (pT2b, pN0, pM1c) - Signed by Derek Jack, MD on 12/11/2020 Histopathologic type: Large cell neuroendocrine carcinoma    12/17/2020 -  Chemotherapy   Patient is on Treatment Plan : LUNG NSCLC Carboplatin + Paclitaxel + Pembrolizumab q21d x 4 cycles / Pembrolizumab Maintenance Q21D        CANCER STAGING: Cancer Staging  Cancer of upper lobe of right lung [facility]) Staging form: Lung, AJCC 8th Edition - Pathologic stage from 09/04/2020: pT2, pN0, cM0 - Signed by Heath Lark, MD on 09/04/2020 - Pathologic stage from 12/11/2020: Stage IVB (pT2b, pN0, pM1c) - Signed by Derek Jack, MD on 12/11/2020   INTERVAL HISTORY:  [name], a 81 y.o. female, returns for routine follow-up of her right lung cancer. Richel was last seen on 06/13/2021.   Today she reports feeling fair. Her appetite is poor, and she is not eating well. She t drinks 1-3 bottles of Ensure daily.  She also reports nausea for which she is taking Compazine prn which helps. She also reports fatigue and SOB. She monitors her O2 at home and reports her O2 tends to range between 93-95. She reports occasional mild cough. She has lost 13 lbs since 3/28.   REVIEW OF SYSTEMS:  Review of Systems  Constitutional:  Positive for appetite change, fatigue and unexpected weight change (-13 lbs).  Respiratory:  Positive for cough and shortness of breath.   Cardiovascular:  Positive for leg swelling.   Gastrointestinal:  Positive for nausea.  Hematological:  Bruises/bleeds easily.  All other systems reviewed and are negative.  PAST MEDICAL/SURGICAL HISTORY:  Past Medical History:  Diagnosis Date   Allergy    rhinitis   Aortic stenosis, severe    a. s/p TAVR 08/2014.   Arthritis    spine and various joints   Cancer of upper lobe of right lung ([facility]) 09/04/2020   Carotid artery disease ([facility])    Cataract    Cerebrovascular disease 12/05/2018   Childhood asthma    Chronic bronchitis ([facility])        Chronic kidney disease 02/17/2021   Claudication ([facility])    COPD GOLD II 07/18/2020  Coronary artery disease    a. April 2012 which revealed mid RCA occlusion with collaterals, 50% LAD stenosis, 30% circumflex stenosis, and 40% marginal stenosis   First degree AV block    GERD (gastroesophageal reflux disease)    Heart murmur    Hyperlipidemia    Hypertension    Hypothyroidism 11/14/2020   Left bundle branch block    LUMBAR RADICULOPATHY, RIGHT    PAF (paroxysmal atrial fibrillation) ([place] [facility])    Peripheral vascular disease ([facility])    a. s/p aortobifem bypass graft surgery and bilateral fundoplasty by [facility].   Renal artery stenosis ([place])    a. renal artery stenosis (>60% L renal artery) by duplex 04/2020.   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 bilateral subclavian stenosis with antegrade flow by duplex 07/2020   Past Surgical History:  Procedure Laterality Date   AORTO-FEMORAL BYPASS GRAFT Bilateral 06/27/10   BASAL CELL CARCINOMA EXCISION Left    face   CARDIAC CATHETERIZATION N/A 07/25/2014   Procedure: Right/Left Heart Cath and Coronary Angiography;  Surgeon: Troy Sine, MD;  Location: [facility];   Service: Cardiovascular;  Laterality: N/A;   CATARACT EXTRACTION, BILATERAL Bilateral 09/29/2016   COLONOSCOPY  2008   Dr. Laural Golden: normal.    DILATION AND CURETTAGE OF UTERUS  "2 or 3"   ESOPHAGEAL DILATION N/A 06/26/2021   Procedure: ESOPHAGEAL DILATION;  Surgeon: Daneil Dolin, MD;  [facility];  Service: Endoscopy;  Laterality: N/A;   ESOPHAGOGASTRODUODENOSCOPY N/A 08/18/2017   Dr. Gala Romney: Normal-appearing esophagus status post dilation.  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  Small hiatal hernia.   ESOPHAGOGASTRODUODENOSCOPY (EGD) WITH PROPOFOL N/A 06/26/2021   Procedure: ESOPHAGOGASTRODUODENOSCOPY (EGD) WITH PROPOFOL;  Surgeon: Daneil Dolin, MD;  [facility];  Service: Endoscopy;  Laterality: N/A;   INTERCOSTAL NERVE BLOCK Right 08/15/2020   Procedure: INTERCOSTAL NERVE BLOCK RIGHT;  Surgeon: Melrose Nakayama, MD;  Location: [facility];  Service: Thoracic;  Laterality: Right;   IR THORACENTESIS ASP PLEURAL SPACE W/IMG GUIDE  10/07/2020   MALONEY DILATION N/A 08/18/2017   Procedure: Venia Minks DILATION;  Surgeon: Daneil Dolin, MD;  [facility];  Service: Endoscopy;  Laterality: N/A;   MULTIPLE TOOTH EXTRACTIONS     NODE DISSECTION Right 08/15/2020   Procedure: NODE DISSECTION;  Surgeon: Melrose Nakayama, MD;  Location: [facility];  Service: Thoracic;  Laterality: Right;   PORTACATH PLACEMENT Left 01/15/2021   Procedure: INSERTION PORT-A-CATH;  Surgeon: Aviva Signs, MD;  Location: [facility];  Service: General;  Laterality: Left;   PR VEIN BYPASS GRAFT,AORTO-FEM-POP  06/12/10   TEE WITHOUT CARDIOVERSION N/A 08/31/2014   Procedure: TRANSESOPHAGEAL ECHOCARDIOGRAM (TEE);  Surgeon: Rexene Alberts, MD;  Location: [facility];  Service: Open Heart Surgery;  Laterality: N/A;   TRANSCATHETER AORTIC VALVE REPLACEMENT, TRANSFEMORAL N/A 08/31/2014   Procedure: TRANSCATHETER AORTIC VALVE REPLACEMENT, TRANSFEMORAL approach;  Surgeon: Rexene Alberts, MD;  Location: [facility];   Service: Open Heart Surgery;  Laterality: N/A;   TUBAL LIGATION  1970's   VAGINAL HYSTERECTOMY  1970's   Partial     SOCIAL HISTORY:  Social History   Socioeconomic History   Marital  status: Widowed    Spouse name: bobby   Number of children: 1   Years of education: Not on file   Highest education level: High school graduate  Occupational History   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 Retired,used to work at [employer].Lives alone.         Patient has one daughter, 3 grands   Likes to crochet   Works in [employer].    Social Determinants of Health   Financial Resource Strain: Not on file  Food Insecurity: No Food Insecurity   Worried About Charity fundraiser in the Last Year: Never true   Ran Out of Food in the Last Year: Never true  Transportation Needs: No Transportation Needs   Lack of Transportation (Medical): No   Lack of Transportation (Non-Medical): No  Physical Activity: Not on file  Stress: Not on file  Social Connections: Not on file  Intimate Partner Violence: Not on file    FAMILY HISTORY:  Family History  Problem Relation Age of Onset   Heart disease Mother 26       died young   Varicose Veins Mother    Stroke Father    Hypertension Father    Heart disease Father 31       Aneurysm   Heart attack Father    Arthritis Brother    Stroke Brother    Diabetes Daughter    Arthritis Daughter    Heart attack Brother        unsure   Allergic rhinitis Neg Hx     Angioedema Neg Hx    Asthma Neg Hx    Atopy Neg Hx    Eczema Neg Hx    Immunodeficiency Neg Hx    Urticaria Neg Hx     CURRENT MEDICATIONS:  Current Outpatient Medications  Medication Sig Dispense Refill   acetaminophen (TYLENOL) 500 MG tablet Take 500 mg by mouth every 6 (six) hours as needed for moderate pain.     albuterol (VENTOLIN HFA) 108 (90 Base) MCG/ACT inhaler Inhale 2 puffs into the lungs 2 (two) times daily as needed for wheezing or shortness of breath. 8 g 2   amiodarone (PACERONE) 200 MG tablet Take 1 tablet (200 mg total) by mouth 2 (two) times daily. 60 tablet 6   estradiol (ESTRACE) 2 MG tablet Take 1 tablet (2 mg total) by mouth daily. 90 tablet 0   furosemide (LASIX) 20 MG tablet Take 1 tablet (20 mg total) by mouth daily. 30 tablet 4   ipratropium (ATROVENT) 0.06 % nasal spray USE 1 SPRAY(S) IN EACH NOSTRIL EVERY 6 HOURS AS NEEDED FOR RUNNY NOSE (Patient taking differently: Place 1 spray into both nostrils every 6 (six) hours as needed for rhinitis.) 15 mL 2   irbesartan (AVAPRO) 300 MG tablet Take 0.5 tablets (150 mg total) by mouth daily. [place]  tablet 0   levothyroxine (SYNTHROID) 50 MCG tablet Take 1 tablet (50 mcg total) by mouth daily before breakfast. 30 tablet 3   lidocaine-prilocaine (EMLA) cream Apply a small amount to port a cath site and cover with plastic wrap 1 hour prior to infusion appointments 30 g 3   metoCLOPramide (REGLAN) 5 MG tablet Take 2 tablets (10 mg total) by mouth every 8 (eight) hours as needed for nausea, vomiting or refractory nausea / vomiting. 30 tablet 3   metoprolol succinate (TOPROL-XL) 25 MG 24 hr tablet Take 1 tablet by mouth once daily (Patient taking differently: Take 25 mg by mouth daily.) 90 tablet 0   nitroGLYCERIN (NITROSTAT) 0.4 MG SL tablet Place 0.4 mg under the tongue every 5 (five) minutes as needed for chest pain.     nystatin (MYCOSTATIN) 100000 UNIT/ML suspension nystatin 100,000 unit/mL oral suspension  TAKE 5 ML BY MOUTH 4  TIMES DAILY     oxyCODONE (OXY IR/ROXICODONE) 5 MG immediate release tablet Take 1 tablet (5 mg total) by mouth at bedtime as needed for severe pain. 30 tablet 0   pantoprazole (PROTONIX) 40 MG tablet TAKE 1 TABLET BY MOUTH ONCE DAILY BEFORE SUPPER (Patient taking differently: Take 40 mg by mouth daily.) 90 tablet 3   predniSONE (DELTASONE) 20 MG tablet Take 1 tablet (20 mg total) by mouth daily with breakfast. 60 tablet 0   prochlorperazine (COMPAZINE) 10 MG tablet Take 1 tablet (10 mg total) by mouth every 6 (six) hours as needed (Nausea or vomiting). 30 tablet 1   rosuvastatin (CRESTOR) 20 MG tablet Take 1 tablet (20 mg total) by mouth at bedtime. 90 tablet 0   warfarin (COUMADIN) 2.5 MG tablet Take 1 tablet (2.5 mg total) by mouth at bedtime.     No current facility-administered medications for this visit.    ALLERGIES:  Allergies  Allergen Reactions   Adhesive [Tape] Rash   Latex Rash   Tetanus Toxoids Rash   Wound Dressing Adhesive Rash    PHYSICAL EXAM:  Performance status (ECOG): 2 - Symptomatic, <50% confined to bed  There were no vitals filed for this visit. Wt Readings from Last 3 Encounters:  07/23/21 140 lb (63.5 kg)  06/20/21 142 lb 6.7 oz (64.6 kg)  05/27/21 148 lb 8 oz (67.4 kg)   Physical Exam Vitals reviewed.  Constitutional:      Appearance: Normal appearance.     Comments: In wheelchair  Cardiovascular:     Rate and Rhythm: Normal rate and regular rhythm.     Pulses: Normal pulses.     Heart sounds: Normal heart sounds.  Pulmonary:     Effort: Pulmonary effort is normal.     Breath sounds: Normal breath sounds.  Musculoskeletal:     Right lower leg: Edema (trace) present.     Left lower leg: Edema (trace) present.  Neurological:     General: No focal deficit present.     Mental Status: She is alert and oriented to person, place, and time.  Psychiatric:        Mood and Affect: Mood normal.        Behavior: Behavior normal.     LABORATORY DATA:   I have reviewed the labs as listed.     Latest Ref Rng & Units 06/27/2021    4:55 AM 06/26/2021    4:14 AM 06/25/2021    4:52 AM  CBC  WBC 4.0 - 10.5 K/uL 11.9   15.3   14.1  Hemoglobin 12.0 - 15.0 g/dL 11.7   12.2   12.4    Hematocrit 36.0 - 46.0 % 37.4   38.4   39.0    Platelets 150 - 400 K/uL 108   110   111        Latest Ref Rng & Units 06/27/2021    4:55 AM 06/26/2021    4:14 AM 06/25/2021    4:52 AM  CMP  Glucose 70 - 99 mg/dL 97   127   102    BUN 8 - 23 mg/dL '17   17   13    ' Creatinine 0.44 - 1.00 mg/dL 0.99   0.96   0.99    Sodium 135 - 145 mmol/L 140   139   139    Potassium 3.5 - 5.1 mmol/L 3.9   3.7   4.0    Chloride 98 - 111 mmol/L 104   106   104    CO2 22 - 32 mmol/L '29   27   30    ' Calcium 8.9 - 10.3 mg/dL 7.8   7.8   7.8      DIAGNOSTIC IMAGING:  I have independently reviewed the scans and discussed with the patient. VAS US CAROTID  Result Date: 08/02/2021 Carotid Arterial Duplex Study Patient Name:  ZAMANTHA STREBEL Dicaprio  Date of Exam:   08/01/2021 Medical Rec #: [id number]       Accession #:    [id number] Date of Birth: Jul 29, 1940       Patient Gender: F Patient Age:   81 years Exam Location:  [facility] Procedure:      VAS US CAROTID Referring Phys: Shelva Majestic --------------------------------------------------------------------------------  Indications:       Carotid artery disease. Risk Factors:      Hypertension, hyperlipidemia, past history of smoking,                    coronary artery disease, PAD. Other Factors:     Patient denies any cerebrovascular symptoms. Comparison Study:  05/5463-KCLE 751/70 cm/s; LICA 01/74 cm/s Performing Technologist: Wilkie Aye RVT  Examination Guidelines: A complete evaluation includes B-mode imaging, spectral Doppler, color Doppler, and power Doppler as needed of all accessible portions of each vessel. Bilateral testing is considered an integral part of a complete examination. Limited examinations for reoccurring indications may be performed  as noted.  Right Carotid Findings: +----------+--------+--------+--------+--------------------------+--------+           PSV cm/sEDV cm/sStenosisPlaque Description        Comments +----------+--------+--------+--------+--------------------------+--------+ CCA Prox  78      11              heterogenous                       +----------+--------+--------+--------+--------------------------+--------+ CCA Mid   55      10      <50%    heterogenous                       +----------+--------+--------+--------+--------------------------+--------+ CCA Distal26      4               heterogenous                       +----------+--------+--------+--------+--------------------------+--------+ ICA Prox  208     55      40-59%  heterogenous and irregular         +----------+--------+--------+--------+--------------------------+--------+  ICA Mid   120     33                                                 +----------+--------+--------+--------+--------------------------+--------+ ICA Distal73      25                                                 +----------+--------+--------+--------+--------------------------+--------+ ECA       79      2               heterogenous                       +----------+--------+--------+--------+--------------------------+--------+ +----------+--------+-------+----------------+-------------------+           PSV cm/sEDV cmsDescribe        Arm Pressure (mmHG) +----------+--------+-------+----------------+-------------------+ [id number]            Multiphasic, [id number]                 +----------+--------+-------+----------------+-------------------+ +---------+--------+--+--------+-+----------------------------+ VertebralPSV cm/s20EDV cm/s4Antegrade and High resistant +---------+--------+--+--------+-+----------------------------+  Left Carotid Findings:  +----------+--------+--------+--------+--------------------------+--------+           PSV cm/sEDV cm/sStenosisPlaque Description        Comments +----------+--------+--------+--------+--------------------------+--------+ CCA Prox  32      7               heterogenous                       +----------+--------+--------+--------+--------------------------+--------+ CCA Mid   30      7       <50%    heterogenous                       +----------+--------+--------+--------+--------------------------+--------+ CCA Distal34      11              heterogenous                       +----------+--------+--------+--------+--------------------------+--------+ ICA Prox  75      15      1-39%   heterogenous and irregular         +----------+--------+--------+--------+--------------------------+--------+ ICA Mid   63      20                                                 +----------+--------+--------+--------+--------------------------+--------+ ICA Distal74      20                                                 +----------+--------+--------+--------+--------------------------+--------+ ECA       240     29      >50%    heterogenous and irregular         +----------+--------+--------+--------+--------------------------+--------+ +----------+--------+--------+----------------+-------------------+           PSV cm/sEDV cm/sDescribe  Arm Pressure (mmHG) +----------+--------+--------+----------------+-------------------+ [id number]             Multiphasic, [id number]                 +----------+--------+--------+----------------+-------------------+ +---------+--------+--+--------+-+----------------------------+ VertebralPSV cm/s26EDV cm/s6Antegrade and High resistant +---------+--------+--+--------+-+----------------------------+   Summary: Right Carotid: Velocities in the right ICA are consistent with a 40-59%                stenosis.  Non-hemodynamically significant plaque <50% noted in                the CCA. Left Carotid: Velocities in the left ICA are consistent with a 1-39% stenosis.               Non-hemodynamically significant plaque <50% noted in the CCA. The               ECA appears >50% stenosed. Vertebrals:  Bilateral vertebral arteries demonstrate antegrade flow. Bilateral              vertebral arteries demonstrate high resistant flow. Subclavians: Normal flow hemodynamics were seen in bilateral subclavian              arteries. *See table(s) above for measurements and observations.  Electronically signed by Carlyle Dolly MD on 08/02/2021 at 3:45:09 PM.    Final      ASSESSMENT:  1.  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 - She reported COVID infection in December 2020 and has been fatigued since then. - Chest x-ray on 06/21/2020 showed masslike opacity in the right lung. - CT chest with contrast on 06/28/2020 showed right upper lobe lung mass measuring 4 x 3.2 x 3.8 cm.  Small noncalcified mediastinal lymph nodes are all subcentimeter, nonspecific.  Small subpleural nodules in the right upper lobe measure up to 5 mm.  Clustered lingular nodules measuring up to 6 mm nonspecific.  Nonspecific 8 mm enhancing focus in the left lobe of the liver. - Right upper lobectomy and lymph node dissection on 08/15/2020. - Pathology consistent with 4.4 cm large cell neuroendocrine carcinoma, margins negative.  Lymph nodes at stations 7, 8, 9, 10, 11, 4R, 12 negative for metastatic disease.  No visceral pleural invasion or direct invasion of the adjacent structures.  No lymphovascular invasion.  IHC positive for CD56, synaptophysin and TTF-1.  Chromogranin negative.  Ki-67 elevated. - PET scan on 10/01/2020 with postoperative changes in the chest wall.  Intense hyper metabolism in the acromion with SUV 8.6.  Hypermetabolic focus in the right gluteal region  favoring injection site. - Foundation 1 results showed TMB high, K-ras G12C mutation positive.  STK 11 positive - PD-L1 TPS 1%. - Right lateral pelvic soft tissue biopsy on 11/27/2020 consistent with metastatic neuroendocrine carcinoma, Ki-67 40-50%. - Based on K-ras G 12 C and STK 11 positivity, we have recommended treatment like non-small cell lung cancer. - Cycle 1 of carboplatin, paclitaxel and pembrolizumab on 12/17/2020.   2.  Social/family history: - She lives by herself at home and is independent of ADLs and IADLs. - She smoked 2 packs/day for 56 years and quit in May 2012. - She worked in [employer]. - Her nephew had colon cancer.  No other malignancies in the family.   PLAN:  1.  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 - Keytruda on hold since March due to pneumonitis. - I have recommended CT of the chest with contrast.   2.  Hypothyroidism: - Continue Synthroid 50 mcg daily.  TSH today is 3.7.  3.  Nausea: - She reports that nausea has worsened since amiodarone dose was increased to twice daily on 07/23/2021. - I have recommended her to take Compazine 10 mg 3 times daily as a standing dose.  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 - She is currently taking prednisone 20 mg daily.  Her respiratory rate is 20 and saturations are 92%.  She gets short of breath on exertion. - I have recommended CT of the chest with contrast.  Until after the CT of the chest, we will continue prednisone 20 mg at the same dose.  5.  Nutrition: - She is drinking Ensure 1 can/day most days due to nausea.  She can drink up to 2 to 3 cans/day on good days. - She has lost 13 pounds since last visit on 06/02/2021. - I have sent a prescription for Megace  400 mg twice daily as appetite stimulant.   Orders placed this encounter:  No orders of the defined types were placed in this encounter.    Derek Jack, MD [place] [phone]   I, Thana Ates, am acting as a scribe for Dr. Derek Jack.  I, Derek Jack MD, have reviewed the above documentation for accuracy and completeness, and I agree with the above.

## 2021-08-05 DIAGNOSIS — Z952 Presence of prosthetic heart valve: Secondary | ICD-10-CM | POA: Diagnosis not present

## 2021-08-05 LAB — POCT INR: INR: 2.9 (ref 2.0–3.0)

## 2021-08-06 ENCOUNTER — Ambulatory Visit (HOSPITAL_COMMUNITY): Admission: RE | Admit: 2021-08-06 | Payer: Medicare Other | Source: Ambulatory Visit

## 2021-08-07 ENCOUNTER — Ambulatory Visit (INDEPENDENT_AMBULATORY_CARE_PROVIDER_SITE_OTHER): Payer: Medicare Other | Admitting: *Deleted

## 2021-08-07 DIAGNOSIS — Z7901 Long term (current) use of anticoagulants: Secondary | ICD-10-CM | POA: Diagnosis not present

## 2021-08-07 DIAGNOSIS — I4819 Other persistent atrial fibrillation: Secondary | ICD-10-CM | POA: Diagnosis not present

## 2021-08-11 ENCOUNTER — Ambulatory Visit (HOSPITAL_BASED_OUTPATIENT_CLINIC_OR_DEPARTMENT_OTHER)
Admission: RE | Admit: 2021-08-11 | Discharge: 2021-08-11 | Disposition: A | Discharge status: Home / Self Care | Payer: Medicare Other | Source: Ambulatory Visit | Attending: Hematology | Admitting: Hematology

## 2021-08-11 DIAGNOSIS — J439 Emphysema, unspecified: Secondary | ICD-10-CM | POA: Diagnosis not present

## 2021-08-11 DIAGNOSIS — C3411 Malignant neoplasm of upper lobe, right bronchus or lung: Secondary | ICD-10-CM | POA: Diagnosis not present

## 2021-08-11 DIAGNOSIS — C3490 Malignant neoplasm of unspecified part of unspecified bronchus or lung: Secondary | ICD-10-CM | POA: Diagnosis not present

## 2021-08-11 DIAGNOSIS — J90 Pleural effusion, not elsewhere classified: Secondary | ICD-10-CM | POA: Diagnosis not present

## 2021-08-11 MED ORDER — HEPARIN SOD (PORK) LOCK FLUSH 100 UNIT/ML IV SOLN
500.0000 [IU] | Freq: Once | INTRAVENOUS | Status: AC
  Administered 2021-08-11: 500 [IU] via INTRAVENOUS

## 2021-08-11 MED ORDER — IOHEXOL 300 MG/ML  SOLN
80.0000 mL | Freq: Once | INTRAMUSCULAR | Status: AC | PRN
  Administered 2021-08-11: 80 mL via INTRAVENOUS

## 2021-08-12 ENCOUNTER — Ambulatory Visit (INDEPENDENT_AMBULATORY_CARE_PROVIDER_SITE_OTHER): Payer: Medicare Other | Admitting: *Deleted

## 2021-08-12 DIAGNOSIS — I4819 Other persistent atrial fibrillation: Secondary | ICD-10-CM | POA: Diagnosis not present

## 2021-08-12 DIAGNOSIS — Z7901 Long term (current) use of anticoagulants: Secondary | ICD-10-CM | POA: Diagnosis not present

## 2021-08-12 LAB — POCT INR: INR: 2.3 (ref 2.0–3.0)

## 2021-08-12 NOTE — Patient Instructions (Signed)
Description   Spoke with pt and advised to continue taking warfarin 1 tablet (2.5mg ) daily. Recheck INR in 1 week (weekly tester). [facility] [phone]

## 2021-08-14 ENCOUNTER — Inpatient Hospital Stay (HOSPITAL_BASED_OUTPATIENT_CLINIC_OR_DEPARTMENT_OTHER): Payer: Medicare Other | Admitting: Hematology

## 2021-08-14 ENCOUNTER — Encounter (HOSPITAL_COMMUNITY): Payer: Self-pay | Admitting: Hematology

## 2021-08-14 ENCOUNTER — Ambulatory Visit (HOSPITAL_COMMUNITY)
Admission: RE | Admit: 2021-08-14 | Discharge: 2021-08-14 | Disposition: A | Discharge status: Home / Self Care | Payer: Medicare Other | Source: Ambulatory Visit | Attending: Cardiovascular Disease | Admitting: Cardiovascular Disease

## 2021-08-14 VITALS — BP 112/72 | HR 78 | Temp 97.1°F | Resp 18

## 2021-08-14 DIAGNOSIS — I4819 Other persistent atrial fibrillation: Secondary | ICD-10-CM | POA: Diagnosis not present

## 2021-08-14 DIAGNOSIS — Z952 Presence of prosthetic heart valve: Secondary | ICD-10-CM | POA: Insufficient documentation

## 2021-08-14 DIAGNOSIS — C3411 Malignant neoplasm of upper lobe, right bronchus or lung: Secondary | ICD-10-CM

## 2021-08-14 LAB — ECHOCARDIOGRAM COMPLETE
Area-P 1/2: 6.17 cm2
S' Lateral: 2.5 cm

## 2021-08-14 NOTE — Progress Notes (Signed)
*  PRELIMINARY RESULTS* Echocardiogram 2D Echocardiogram has been performed.  Katelyn [name] 08/14/2021, 12:59 PM

## 2021-08-14 NOTE — Progress Notes (Signed)
[facility] [place], [name] [address]   CLINIC:  Medical Oncology/Hematology  PCP:  Katelyn Squibb, MD [address] [name] [address] [phone]   REASON FOR VISIT:  Follow-up for right lung cancer  PRIOR THERAPY: Right upper lobectomy (08/15/20)  NGS Results: not done  CURRENT THERAPY: Carboplatin, Taxol, and Keytruda every 3 weeks x 4 cycles; to be followed by Beryle Flock maintenance every 3 weeks  BRIEF ONCOLOGIC HISTORY:  Oncology History Overview Note  Foundation One PD-L1 TPS score 1 16 mut/MB [id number] mutation MSI stable   Cancer of upper lobe of right lung ([facility])  08/15/2020 Pathology Results   FINAL MICROSCOPIC DIAGNOSIS:   A. 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  - Tumor is limited to lung.  - Margins are negative for carcinoma.  - One of one lymph nodes negative for carcinoma (0/1).  - See oncology table.   B. LYMPH NODE, 7, EXCISION:  - One of one lymph nodes negative for carcinoma (0/1).   C. LYMPH NODE, 7 #2, EXCISION:  - One of one lymph nodes negative for carcinoma (0/1).   D. LYMPH NODE, 9, EXCISION:  - One of one lymph nodes negative for carcinoma (0/1).   E. LYMPH NODE, 8, EXCISION:  - One of one lymph nodes negative for carcinoma (0/1).   F. LYMPH NODE, 7 #3, EXCISION:  - One of one lymph nodes negative for carcinoma (0/1).   G. LYMPH NODE, 11, EXCISION:  - One of one lymph nodes negative for carcinoma (0/1).   H. LYMPH NODE, 10, EXCISION:  - One of one lymph nodes negative for carcinoma (0/1).   I. LYMPH NODE, 4R, EXCISION:  - One of one lymph nodes negative for carcinoma (0/1).   J. LYMPH NODE, 10 #2, EXCISION:   - One of one lymph nodes negative for carcinoma (0/1).  - Fibrosis and calcifications.   K. LYMPH NODE, 12, EXCISION:  - One of one lymph nodes negative for carcinoma (0/1).   L. LYMPH NODE, 12 #2, EXCISION:  - One of one  lymph nodes negative for carcinoma (0/1).   ONCOLOGY TABLE:   LUNG: Resection   Synchronous Tumors: Not applicable  Total Number of Primary Tumors: 1  Procedure: Right upper lobe lobectomy with lymph node biopsies  Specimen Laterality: Right  Tumor Focality: Unifocal  Tumor Site: Right upper lobe  Tumor Size: 4.4 cm  Histologic Type: Large cell neuroendocrine carcinoma  Visceral Pleura Invasion: Not identified  Direct Invasion of Adjacent Structures: No adjacent structures present  Lymphovascular Invasion: Not identified  Margins: All margins negative for invasive carcinoma       Closest Margin(s) to Invasive Carcinoma: 1.5 cm to bronchial margin  Treatment Effect: No known presurgical therapy  Regional Lymph Nodes:       Number of Lymph Nodes Involved: 0                            Nodal Sites with Tumor: Not applicable       Number of Lymph Nodes Examined: 12                       Nodal Sites Examined: 4, 7, 8, 9, 10, 11, 12  Distant Metastasis:       Distant Site(s) Involved: Not applicable  Pathologic Stage Classification (pTNM, AJCC 8th  Edition): pT2b, pN0  Ancillary Studies: Can be performed upon request  Representative Tumor Block: A2  Comment(s): The tumor has a subtle neuroendocrine features, cribriform areas with necrosis, and geographic areas of necrosis.  Immunohistochemistry is positive for CD56, synaptophysin, and TTF-1.  Chromogranin is largely negative. Ki-67 is elevated. Dr. Saralyn [name] has reviewed the case.     08/15/2020 Surgery   PREOPERATIVE DIAGNOSIS:  Right upper lobe lung mass, suspected non-small cell carcinoma.   POSTOPERATIVE DIAGNOSIS:  Non-small cell carcinoma, right upper lobe, clinical stage IB (T2, N0).   PROCEDURE:   Xi robotic-assisted right upper lobectomy,  Lymph node dissection,  Intercostal nerve blocks levels 3 through 10.   SURGEON:  Katelyn Charon, MD    FINDINGS:  Fragile tissue.  Enlarged but otherwise benign appearing nodes.   One calcified node.  Frozen section revealed non-small cell carcinoma.  Bronchial margin clear.   09/04/2020 Initial Diagnosis   Cancer of upper lobe of right lung ([facility])   09/04/2020 Cancer Staging   Staging form: Lung, AJCC 8th Edition - Pathologic stage from 09/04/2020: pT2, pN0, cM0 - Signed by Katelyn Lark, MD on 09/04/2020 Stage prefix: Initial diagnosis   09/11/2020 Genetic Testing   PD-L1 Results:     12/11/2020 Cancer Staging   Staging form: Lung, AJCC 8th Edition - Pathologic stage from 12/11/2020: Stage IVB (pT2b, pN0, pM1c) - Signed by Katelyn Jack, MD on 12/11/2020 Histopathologic type: Large cell neuroendocrine carcinoma   12/17/2020 -  Chemotherapy   Patient is on Treatment Plan : LUNG NSCLC Carboplatin + Paclitaxel + Pembrolizumab q21d x 4 cycles / Pembrolizumab Maintenance Q21D       CANCER STAGING: Cancer Staging  Cancer of upper lobe of right lung [facility]) Staging form: Lung, AJCC 8th Edition - Pathologic stage from 09/04/2020: pT2, pN0, cM0 - Signed by Katelyn Lark, MD on 09/04/2020 - Pathologic stage from 12/11/2020: Stage IVB (pT2b, pN0, pM1c) - Signed by Katelyn Jack, MD on 12/11/2020   INTERVAL HISTORY:  Katelyn [name], a 81 y.o. female, returns for routine follow-up of her right lung cancer. Katelyn [name] was last seen on 08/04/2021.   Today she reports feeling well. She reports swelling in her feet. She is eating well. She reports anxiety.  REVIEW OF SYSTEMS:  Review of Systems  Constitutional:  Positive for fatigue. Negative for appetite change.  Cardiovascular:  Positive for leg swelling.  Hematological:  Bruises/bleeds easily.  Psychiatric/Behavioral:  The patient is nervous/anxious.   All other systems reviewed and are negative.   PAST MEDICAL/SURGICAL HISTORY:  Past Medical History:  Diagnosis Date   Allergy    rhinitis   Aortic stenosis, severe    a. s/p TAVR 08/2014.   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 April 2012 which revealed mid RCA occlusion with collaterals, 50% LAD stenosis, 30% circumflex stenosis, and 40% marginal stenosis   First degree AV block    GERD (gastroesophageal reflux disease)    Heart murmur    Hyperlipidemia    Hypertension    Hypothyroidism 11/14/2020   Left bundle branch block    LUMBAR  RADICULOPATHY, RIGHT    PAF (paroxysmal atrial fibrillation) (H[facility])    Peripheral vascular disease ([place])    a. s/p aortobifem bypass graft surgery and bilateral fundoplasty by [facility].   Renal artery stenosis ([facility])    a. renal artery stenosis (>60% L renal artery) by duplex 04/2020.   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 bilateral subclavian stenosis with antegrade flow by duplex 07/2020   Past Surgical History:  Procedure Laterality Date   AORTO-FEMORAL BYPASS GRAFT Bilateral 06/27/10   BASAL CELL CARCINOMA EXCISION Left    face   CARDIAC CATHETERIZATION N/A 07/25/2014   Procedure: Right/Left Heart Cath and Coronary Angiography;  Surgeon: Troy Sine, MD;  Location: [facility];  Service: Cardiovascular;  Laterality: N/A;   CATARACT EXTRACTION, BILATERAL Bilateral 09/29/2016   COLONOSCOPY  2008   Dr. Laural Golden: normal.    DILATION AND CURETTAGE OF UTERUS  "2 or 3"   ESOPHAGEAL DILATION N/A 06/26/2021   Procedure: ESOPHAGEAL DILATION;  Surgeon: Daneil Dolin, MD;  [facility];  Service: Endoscopy;   Laterality: N/A;   ESOPHAGOGASTRODUODENOSCOPY N/A 08/18/2017   Dr. Gala Romney: Normal-appearing esophagus status post dilation.  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  Small hiatal hernia.   ESOPHAGOGASTRODUODENOSCOPY (EGD) WITH PROPOFOL N/A 06/26/2021   Procedure: ESOPHAGOGASTRODUODENOSCOPY (EGD) WITH PROPOFOL;  Surgeon: Daneil Dolin, MD;  [facility];  Service: Endoscopy;  Laterality: N/A;   INTERCOSTAL NERVE BLOCK Right 08/15/2020   Procedure: INTERCOSTAL NERVE BLOCK RIGHT;  Surgeon: Melrose Nakayama, MD;  Location: [facility];  Service: Thoracic;  Laterality: Right;   IR THORACENTESIS ASP PLEURAL SPACE W/IMG GUIDE  10/07/2020   MALONEY DILATION N/A 08/18/2017   Procedure: Venia Minks DILATION;  Surgeon: Daneil Dolin, MD;  [facility];  Service: Endoscopy;  Laterality: N/A;   MULTIPLE TOOTH EXTRACTIONS     NODE DISSECTION Right 08/15/2020   Procedure: NODE DISSECTION;  Surgeon: Melrose Nakayama, MD;  Location: [facility];  Service: Thoracic;  Laterality: Right;   PORTACATH PLACEMENT Left 01/15/2021   Procedure: INSERTION PORT-A-CATH;  Surgeon: Aviva Signs, MD;  Location: [facility];  Service: General;  Laterality: Left;   PR VEIN BYPASS GRAFT,AORTO-FEM-POP  06/12/10   TEE WITHOUT CARDIOVERSION N/A 08/31/2014   Procedure: TRANSESOPHAGEAL ECHOCARDIOGRAM (TEE);  Surgeon: Rexene Alberts, MD;  Location: [facility];  Service: Open Heart Surgery;  Laterality: N/A;   TRANSCATHETER AORTIC VALVE REPLACEMENT, TRANSFEMORAL N/A 08/31/2014   Procedure: TRANSCATHETER AORTIC VALVE REPLACEMENT, TRANSFEMORAL approach;  Surgeon: Rexene Alberts, MD;  Location: [facility];  Service: Open Heart Surgery;  Laterality: N/A;   TUBAL LIGATION  1970's   VAGINAL HYSTERECTOMY  1970's   Partial     SOCIAL HISTORY:  Social History   Socioeconomic History   Marital status: Widowed    Spouse name: bobby   Number of children: 1   Years of education: Not on file   Highest education level: High school graduate   Occupational History   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 Retired,used to work at [employer].Lives alone.         Patient has one daughter, 3 grands   Likes to crochet   Works in [employer].    Social Determinants of Health   Financial Resource Strain: Low Risk  (03/10/2017)   Overall Financial Resource Strain (CARDIA)    Difficulty of Paying Living Expenses: Not very hard  Food Insecurity: No Food Insecurity (12/17/2020)   Hunger Vital Sign    Worried About Running Out of Food in the Last Year: Never true    Ran Out of Food in the Last Year: Never true  Transportation Needs: No Transportation Needs (12/17/2020)   PRAPARE - Hydrologist (Medical): No    Lack of Transportation (Non-Medical): No  Physical Activity: Inactive (03/10/2017)   Exercise Vital Sign    Days of Exercise per Week: 0 days    Minutes of Exercise per Session: 0 min  Stress: No Stress Concern Present (03/10/2017)   [place]    Feeling of Stress : Only a little  Social Connections: Unknown (03/10/2017)   Social Connection and Isolation Panel [NHANES]    Frequency of Communication with Friends and Family: More than three times a week    Frequency of Social Gatherings with Friends and Family: More than three times a week    Attends Religious Services: Patient refused    Active Member of Clubs or Organizations: Patient refused     Attends Archivist Meetings: Never    Marital Status: Married  Human resources officer Violence: Not At Risk (03/10/2017)   Humiliation, Afraid, Rape, and Kick questionnaire    Fear of Current or Ex-Partner: No    Emotionally Abused: No    Physically Abused: No    Sexually Abused: No    FAMILY HISTORY:  Family History  Problem Relation Age of Onset   Heart disease Mother 70       died young   Varicose Veins Mother    Stroke Father    Hypertension Father    Heart disease Father 40       Aneurysm   Heart attack Father    Arthritis Brother    Stroke Brother    Diabetes Daughter    Arthritis Daughter    Heart attack Brother        unsure   Allergic rhinitis Neg Hx    Angioedema Neg Hx    Asthma Neg Hx    Atopy Neg Hx    Eczema Neg Hx    Immunodeficiency Neg Hx    Urticaria Neg Hx     CURRENT MEDICATIONS:  Current Outpatient Medications  Medication Sig Dispense Refill   acetaminophen (TYLENOL) 500 MG tablet Take 500 mg by mouth every 6 (six) hours as needed for moderate pain.     albuterol (VENTOLIN HFA) 108 (90 Base) MCG/ACT inhaler Inhale 2 puffs into the lungs 2 (two) times daily as needed for wheezing  or shortness of breath. 8 g 2   amiodarone (PACERONE) 200 MG tablet Take 1 tablet (200 mg total) by mouth 2 (two) times daily. 60 tablet 6   estradiol (ESTRACE) 2 MG tablet Take 1 tablet (2 mg total) by mouth daily. 90 tablet 0   furosemide (LASIX) 20 MG tablet Take 1 tablet (20 mg total) by mouth daily. 30 tablet 4   ipratropium (ATROVENT) 0.06 % nasal spray USE 1 SPRAY(S) IN EACH NOSTRIL EVERY 6 HOURS AS NEEDED FOR RUNNY NOSE (Patient taking differently: Place 1 spray into both nostrils every 6 (six) hours as needed for rhinitis.) 15 mL 2   irbesartan (AVAPRO) 300 MG tablet Take 0.5 tablets (150 mg total) by mouth daily. 90 tablet 0   levothyroxine (SYNTHROID) 50 MCG tablet Take 1 tablet (50 mcg total) by mouth daily before breakfast. 30 tablet 3   lidocaine-prilocaine  (EMLA) cream Apply a small amount to port a cath site and cover with plastic wrap 1 hour prior to infusion appointments 30 g 3   megestrol (MEGACE) 400 MG/10ML suspension Take 10 mLs (400 mg total) by mouth 2 (two) times daily. 480 mL 3   metoCLOPramide (REGLAN) 5 MG tablet Take 2 tablets (10 mg total) by mouth every 8 (eight) hours as needed for nausea, vomiting or refractory nausea / vomiting. 60 tablet 3   metoprolol succinate (TOPROL-XL) 25 MG 24 hr tablet Take 1 tablet by mouth once daily (Patient taking differently: Take 25 mg by mouth daily.) 90 tablet 0   nitroGLYCERIN (NITROSTAT) 0.4 MG SL tablet Place 0.4 mg under the tongue every 5 (five) minutes as needed for chest pain.     nystatin (MYCOSTATIN) 100000 UNIT/ML suspension nystatin 100,000 unit/mL oral suspension  TAKE 5 ML BY MOUTH 4 TIMES DAILY     oxyCODONE (OXY IR/ROXICODONE) 5 MG immediate release tablet Take 1 tablet (5 mg total) by mouth at bedtime as needed for severe pain. 30 tablet 0   pantoprazole (PROTONIX) 40 MG tablet TAKE 1 TABLET BY MOUTH ONCE DAILY BEFORE SUPPER (Patient taking differently: Take 40 mg by mouth daily.) 90 tablet 3   predniSONE (DELTASONE) 20 MG tablet Take 1 tablet (20 mg total) by mouth daily with breakfast. 30 tablet 0   prochlorperazine (COMPAZINE) 10 MG tablet Take 1 tablet (10 mg total) by mouth every 6 (six) hours as needed (Nausea or vomiting). 60 tablet 3   rosuvastatin (CRESTOR) 20 MG tablet Take 1 tablet (20 mg total) by mouth at bedtime. 90 tablet 0   warfarin (COUMADIN) 2.5 MG tablet Take 1 tablet (2.5 mg total) by mouth at bedtime.     No current facility-administered medications for this visit.    ALLERGIES:  Allergies  Allergen Reactions   Adhesive [Tape] Rash   Latex Rash   Tetanus Toxoids Rash   Wound Dressing Adhesive Rash    PHYSICAL EXAM:  Performance status (ECOG): 2 - Symptomatic, <50% confined to bed  Vitals:   08/14/21 1027  BP: 112/72  Pulse: 78  Resp: 18  Temp:  (!) 97.1 F (36.2 C)  SpO2: 96%   Wt Readings from Last 3 Encounters:  08/04/21 135 lb 8 oz (61.5 kg)  07/23/21 140 lb (63.5 kg)  06/20/21 142 lb 6.7 oz (64.6 kg)   Physical Exam Vitals reviewed.  Constitutional:      Appearance: Normal appearance.  Cardiovascular:     Rate and Rhythm: Normal rate and regular rhythm.     Pulses: Normal pulses.  Heart sounds: Normal heart sounds.  Pulmonary:     Effort: Pulmonary effort is normal.     Breath sounds: Examination of the right-lower field reveals decreased breath sounds. Decreased breath sounds present.  Musculoskeletal:     Right ankle: No swelling.     Left ankle: No swelling.     Right foot: Swelling present.     Left foot: Swelling present.  Neurological:     General: No focal deficit present.     Mental Status: She is alert and oriented to person, place, and time.  Psychiatric:        Mood and Affect: Mood normal.        Behavior: Behavior normal.      LABORATORY DATA:  I have reviewed the labs as listed.     Latest Ref Rng & Units 08/04/2021    2:02 PM 06/27/2021    4:55 AM 06/26/2021    4:14 AM  CBC  WBC 4.0 - 10.5 K/uL 9.2  11.9  15.3   Hemoglobin 12.0 - 15.0 g/dL 14.9  11.7  12.2   Hematocrit 36.0 - 46.0 % 48.8  37.4  38.4   Platelets 150 - 400 K/uL 108  108  110       Latest Ref Rng & Units 08/04/2021    2:02 PM 06/27/2021    4:55 AM 06/26/2021    4:14 AM  CMP  Glucose 70 - 99 mg/dL 109  97  127   BUN 8 - 23 mg/dL '23  17  17   ' Creatinine 0.44 - 1.00 mg/dL 0.98  0.99  0.96   Sodium 135 - 145 mmol/L 138  140  139   Potassium 3.5 - 5.1 mmol/L 4.3  3.9  3.7   Chloride 98 - 111 mmol/L 104  104  106   CO2 22 - 32 mmol/L '29  29  27   ' Calcium 8.9 - 10.3 mg/dL 8.2  7.8  7.8   Total Protein 6.5 - 8.1 g/dL 5.9     Total Bilirubin 0.3 - 1.2 mg/dL 0.8     Alkaline Phos 38 - 126 U/L 97     AST 15 - 41 U/L 25     ALT 0 - 44 U/L 35       DIAGNOSTIC IMAGING:  I have independently reviewed the scans and discussed  with the patient. CT Chest W Contrast  Result Date: 08/13/2021 CLINICAL DATA:  Non-small cell lung cancer, recently stopped chemotherapy. * Tracking Code: BO * EXAM: CT CHEST WITH CONTRAST TECHNIQUE: Multidetector CT imaging of the chest was performed during intravenous contrast administration. RADIATION DOSE REDUCTION: This exam was performed according to the departmental dose-optimization program which includes automated exposure control, adjustment of the mA and/or kV according to patient size and/or use of iterative reconstruction technique. CONTRAST:  23m OMNIPAQUE IOHEXOL 300 MG/ML  SOLN COMPARISON:  PET 05/08/2021 and CT chest 12/16/2020. FINDINGS: Cardiovascular: Left IJ Port-A-Cath terminates in the SVC. Atherosclerotic calcification of the aorta and coronary arteries. Aortic valve replacement. Heart is enlarged. No pericardial effusion. Mediastinum/Nodes: Low internal jugular and mediastinal lymph nodes are not enlarged by CT size criteria. No hilar or axillary adenopathy. Esophagus is grossly unremarkable. Lungs/Pleura: Biapical pleuroparenchymal scarring. Centrilobular emphysema. Right upper lobectomy. Interval clearing of previously seen patchy/peribronchovascular ground-glass on 05/08/2021. Large right pleural effusion, slightly increased from 05/08/2021. Adjacent compressive atelectasis in the right lung. Clustered peribronchovascular nodularity in the lingula, unchanged and likely postinfectious in etiology. No new pulmonary nodules.  No left pleural fluid. Airway is otherwise unremarkable. Upper Abdomen: Liver margin is irregular. Visualized portions of the liver, adrenal glands, kidneys, spleen, pancreas and stomach are otherwise grossly unremarkable. Musculoskeletal: No worrisome lytic or sclerotic lesions. Right thoracotomy. IMPRESSION: 1. No evidence of recurrent or metastatic disease. 2. Interval clearing of patchy/peribronchovascular ground-glass seen on 05/08/2021, possibly due to  therapy-related cessation of treatment. 3. Large right pleural effusion, increased from slightly increased from 05/08/2021. 4. Cirrhosis. 5. Aortic atherosclerosis (ICD10-I70.0). Coronary artery calcification. 6.  Emphysema (ICD10-J43.9). Electronically Signed   By: Lorin Picket M.D.   On: 08/13/2021 08:33   VAS US CAROTID  Result Date: 08/02/2021 Carotid Arterial Duplex Study Patient Name:  Katelyn [name]  Date of Exam:   08/01/2021 Medical Rec #: [id number]       Accession #:    [id number] Date of Birth: 09/21/1940       Patient Gender: F Patient Age:   7 years Exam Location:  [facility] Procedure:      VAS US CAROTID Referring Phys: Shelva Majestic --------------------------------------------------------------------------------  Indications:       Carotid artery disease. Risk Factors:      Hypertension, hyperlipidemia, past history of smoking,                    coronary artery disease, PAD. Other Factors:     Patient denies any cerebrovascular symptoms. Comparison Study:  10/6759-PJKD 326/71 cm/s; LICA 24/58 cm/s Performing Technologist: Wilkie Aye RVT  Examination Guidelines: A complete evaluation includes B-mode imaging, spectral Doppler, color Doppler, and power Doppler as needed of all accessible portions of each vessel. Bilateral testing is considered an integral part of a complete examination. Limited examinations for reoccurring indications may be performed as noted.  Right Carotid Findings: +----------+--------+--------+--------+--------------------------+--------+           PSV cm/sEDV cm/sStenosisPlaque Description        Comments +----------+--------+--------+--------+--------------------------+--------+ CCA Prox  78      11              heterogenous                       +----------+--------+--------+--------+--------------------------+--------+ CCA Mid   55      10      <50%    heterogenous                        +----------+--------+--------+--------+--------------------------+--------+ CCA Distal26      4               heterogenous                       +----------+--------+--------+--------+--------------------------+--------+ ICA Prox  208     55      40-59%  heterogenous and irregular         +----------+--------+--------+--------+--------------------------+--------+ ICA Mid   120     33                                                 +----------+--------+--------+--------+--------------------------+--------+ ICA Distal73      25                                                 +----------+--------+--------+--------+--------------------------+--------+  ECA       79      2               heterogenous                       +----------+--------+--------+--------+--------------------------+--------+ +----------+--------+-------+----------------+-------------------+           PSV cm/sEDV cmsDescribe        Arm Pressure (mmHG) +----------+--------+-------+----------------+-------------------+ [id number]            Multiphasic, [id number]                 +----------+--------+-------+----------------+-------------------+ +---------+--------+--+--------+-+----------------------------+ VertebralPSV cm/s20EDV cm/s4Antegrade and High resistant +---------+--------+--+--------+-+----------------------------+  Left Carotid Findings: +----------+--------+--------+--------+--------------------------+--------+           PSV cm/sEDV cm/sStenosisPlaque Description        Comments +----------+--------+--------+--------+--------------------------+--------+ CCA Prox  32      7               heterogenous                       +----------+--------+--------+--------+--------------------------+--------+ CCA Mid   30      7       <50%    heterogenous                       +----------+--------+--------+--------+--------------------------+--------+ CCA Distal34      11               heterogenous                       +----------+--------+--------+--------+--------------------------+--------+ ICA Prox  75      15      1-39%   heterogenous and irregular         +----------+--------+--------+--------+--------------------------+--------+ ICA Mid   63      20                                                 +----------+--------+--------+--------+--------------------------+--------+ ICA Distal74      20                                                 +----------+--------+--------+--------+--------------------------+--------+ ECA       240     29      >50%    heterogenous and irregular         +----------+--------+--------+--------+--------------------------+--------+ +----------+--------+--------+----------------+-------------------+           PSV cm/sEDV cm/sDescribe        Arm Pressure (mmHG) +----------+--------+--------+----------------+-------------------+ [id number]             Multiphasic, [id number]                 +----------+--------+--------+----------------+-------------------+ +---------+--------+--+--------+-+----------------------------+ VertebralPSV cm/s26EDV cm/s6Antegrade and High resistant +---------+--------+--+--------+-+----------------------------+   Summary: Right Carotid: Velocities in the right ICA are consistent with a 40-59%                stenosis. Non-hemodynamically significant plaque <50% noted in                the CCA. Left Carotid: Velocities in the left ICA are consistent with a 1-39% stenosis.  Non-hemodynamically significant plaque <50% noted in the CCA. The               ECA appears >50% stenosed. Vertebrals:  Bilateral vertebral arteries demonstrate antegrade flow. Bilateral              vertebral arteries demonstrate high resistant flow. Subclavians: Normal flow hemodynamics were seen in bilateral subclavian              arteries. *See table(s) above for measurements and observations.   Electronically signed by Carlyle Dolly MD on 08/02/2021 at 3:45:09 PM.    Final      ASSESSMENT:  1.  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 - She reported COVID infection in December 2020 and has been fatigued since then. - Chest x-ray on 06/21/2020 showed masslike opacity in the right lung. - CT chest with contrast on 06/28/2020 showed right upper lobe lung mass measuring 4 x 3.2 x 3.8 cm.  Small noncalcified mediastinal lymph nodes are all subcentimeter, nonspecific.  Small subpleural nodules in the right upper lobe measure up to 5 mm.  Clustered lingular nodules measuring up to 6 mm nonspecific.  Nonspecific 8 mm enhancing focus in the left lobe of the liver. - Right upper lobectomy and lymph node dissection on 08/15/2020. - Pathology consistent with 4.4 cm large cell neuroendocrine carcinoma, margins negative.  Lymph nodes at stations 7, 8, 9, 10, 11, 4R, 12 negative for metastatic disease.  No visceral pleural invasion or direct invasion of the adjacent structures.  No lymphovascular invasion.  IHC positive for CD56, synaptophysin and TTF-1.  Chromogranin negative.  Ki-67 elevated. - PET scan on 10/01/2020 with postoperative changes in the chest wall.  Intense hyper metabolism in the acromion with SUV 8.6.  Hypermetabolic focus in the right gluteal region favoring injection site. - Foundation 1 results showed TMB high, K-ras G12C mutation positive.  STK 11 positive - PD-L1 TPS 1%. - Right lateral pelvic soft tissue biopsy on 11/27/2020 consistent with metastatic neuroendocrine carcinoma, Ki-67 40-50%. - Based on K-ras G 12 C and STK 11 positivity, we have recommended treatment like non-small cell lung cancer. - Cycle 1 of carboplatin, paclitaxel and pembrolizumab on 12/17/2020.   2.  Social/family history: - She lives by herself at home and is independent of ADLs and IADLs. - She smoked 2 packs/day for 56  years and quit in May 2012. - She worked in [employer]. - Her nephew had colon cancer.  No other malignancies in the family.   PLAN:  1.  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 - CT chest with contrast (1 08/11/2021): No evidence of recurrence or metastatic disease.  Interval clearing of patchy peribronchovascular groundglass seen on 05/08/2021 PET scan.  Large right pleural effusion.  Cirrhosis. - Labs from 08/04/2021 shows albumin is low at 3.1 and normal LFTs.  She has dorsum of the feet swelling.  Increase protein intake.  Use Lasix as needed. - No indication for treatment at this time.  We will continue to watch with CT scans every 3 to 4 months.   2.  Hypothyroidism: - Continue Synthroid 50 mcg daily.  TSH is 3.7.  3.  Nausea: - Nausea has worsened since amiodarone dose increased. - Continue Compazine as needed.  4.  Immunotherapy pneumonitis: - Prednisone 60 mg daily started on  05/13/2021. - Saturations today are 96%. - She is currently on 20 mg daily dose.  We will decrease prednisone to 10 mg daily.  RTC 6 weeks.  5.  Nutrition: - Continue Megace twice daily.  Continue 2-3 ensures per day.     Orders placed this encounter:  No orders of the defined types were placed in this encounter.    Katelyn Jack, MD [place] [phone]   I, Thana Ates, am acting as a scribe for Dr. Derek [name].  I, Katelyn Jack MD, have reviewed the above documentation for accuracy and completeness, and I agree with the above.

## 2021-08-14 NOTE — Patient Instructions (Addendum)
[place] at [facility] Discharge Instructions   You were seen and examined today by Dr. Delton Coombes.  He reviewed the results of your chest CT which is good. You lab work was also normal/stable.   Decrease prednisone to 10 mg (1/2 tablet daily).  Return as scheduled in 6 weeks.    Thank you for choosing [place] at [facility] to provide your oncology and hematology care.  To afford each patient quality time with our provider, please arrive at least 15 minutes before your scheduled appointment time.   If you have a lab appointment with the [facility] please come in thru the Main Entrance and check in at the main information desk.  You need to re-schedule your appointment should you arrive 10 or more minutes late.  We strive to give you quality time with our providers, and arriving late affects you and other patients whose appointments are after yours.  Also, if you no show three or more times for appointments you may be dismissed from the clinic at the providers discretion.     Again, thank you for choosing [facility].  Our hope is that these requests will decrease the amount of time that you wait before being seen by our physicians.       _____________________________________________________________  Should you have questions after your visit to [facility], please contact our office at [phone] and follow the prompts.  Our office hours are 8:00 a.m. and 4:30 p.m. Monday - Friday.  Please note that voicemails left after 4:00 p.m. may not be returned until the following business day.  We are closed weekends and major holidays.  You do have access to a nurse 24-7, just call the main number to the clinic [phone] and do not press any options, hold on the line and a nurse will answer the phone.    For prescription refill requests, have your pharmacy contact our office and allow 72 hours.    Due to Covid,  you will need to wear a mask upon entering the hospital. If you do not have a mask, a mask will be given to you at the Main Entrance upon arrival. For doctor visits, patients may have 1 support person age 1 or older with them. For treatment visits, patients can not have anyone with them due to social distancing guidelines and our immunocompromised population.

## 2021-08-18 ENCOUNTER — Encounter (HOSPITAL_COMMUNITY): Payer: Self-pay

## 2021-08-18 ENCOUNTER — Other Ambulatory Visit: Payer: Self-pay

## 2021-08-18 ENCOUNTER — Inpatient Hospital Stay (HOSPITAL_COMMUNITY)
Admission: EM | Admit: 2021-08-18 | Discharge: 2021-08-24 | DRG: 394 | Disposition: A | Discharge status: Hospice | Payer: Medicare Other | Attending: Family Medicine | Admitting: Family Medicine

## 2021-08-18 DIAGNOSIS — Z7189 Other specified counseling: Secondary | ICD-10-CM | POA: Diagnosis not present

## 2021-08-18 DIAGNOSIS — E440 Moderate protein-calorie malnutrition: Secondary | ICD-10-CM | POA: Diagnosis not present

## 2021-08-18 DIAGNOSIS — J704 Drug-induced interstitial lung disorders, unspecified: Secondary | ICD-10-CM | POA: Diagnosis present

## 2021-08-18 DIAGNOSIS — R1314 Dysphagia, pharyngoesophageal phase: Secondary | ICD-10-CM | POA: Diagnosis not present

## 2021-08-18 DIAGNOSIS — Z791 Long term (current) use of non-steroidal anti-inflammatories (NSAID): Secondary | ICD-10-CM

## 2021-08-18 DIAGNOSIS — R338 Other retention of urine: Secondary | ICD-10-CM

## 2021-08-18 DIAGNOSIS — K224 Dyskinesia of esophagus: Secondary | ICD-10-CM | POA: Diagnosis not present

## 2021-08-18 DIAGNOSIS — I480 Paroxysmal atrial fibrillation: Secondary | ICD-10-CM

## 2021-08-18 DIAGNOSIS — Z953 Presence of xenogenic heart valve: Secondary | ICD-10-CM

## 2021-08-18 DIAGNOSIS — K7460 Unspecified cirrhosis of liver: Secondary | ICD-10-CM | POA: Diagnosis present

## 2021-08-18 DIAGNOSIS — J440 Chronic obstructive pulmonary disease with acute lower respiratory infection: Secondary | ICD-10-CM | POA: Diagnosis present

## 2021-08-18 DIAGNOSIS — E782 Mixed hyperlipidemia: Secondary | ICD-10-CM | POA: Diagnosis not present

## 2021-08-18 DIAGNOSIS — T18108A Unspecified foreign body in esophagus causing other injury, initial encounter: Secondary | ICD-10-CM | POA: Diagnosis not present

## 2021-08-18 DIAGNOSIS — J984 Other disorders of lung: Secondary | ICD-10-CM | POA: Diagnosis present

## 2021-08-18 DIAGNOSIS — E860 Dehydration: Secondary | ICD-10-CM | POA: Diagnosis not present

## 2021-08-18 DIAGNOSIS — I10 Essential (primary) hypertension: Secondary | ICD-10-CM | POA: Diagnosis present

## 2021-08-18 DIAGNOSIS — Y929 Unspecified place or not applicable: Secondary | ICD-10-CM | POA: Diagnosis not present

## 2021-08-18 DIAGNOSIS — D696 Thrombocytopenia, unspecified: Secondary | ICD-10-CM | POA: Diagnosis present

## 2021-08-18 DIAGNOSIS — K222 Esophageal obstruction: Secondary | ICD-10-CM | POA: Diagnosis not present

## 2021-08-18 DIAGNOSIS — R9431 Abnormal electrocardiogram [ECG] [EKG]: Secondary | ICD-10-CM

## 2021-08-18 DIAGNOSIS — R630 Anorexia: Secondary | ICD-10-CM | POA: Diagnosis present

## 2021-08-18 DIAGNOSIS — Z90711 Acquired absence of uterus with remaining cervical stump: Secondary | ICD-10-CM

## 2021-08-18 DIAGNOSIS — T18128A Food in esophagus causing other injury, initial encounter: Secondary | ICD-10-CM | POA: Diagnosis not present

## 2021-08-18 DIAGNOSIS — K2970 Gastritis, unspecified, without bleeding: Secondary | ICD-10-CM | POA: Diagnosis present

## 2021-08-18 DIAGNOSIS — Z8249 Family history of ischemic heart disease and other diseases of the circulatory system: Secondary | ICD-10-CM

## 2021-08-18 DIAGNOSIS — Z91040 Latex allergy status: Secondary | ICD-10-CM | POA: Diagnosis not present

## 2021-08-18 DIAGNOSIS — Z8673 Personal history of transient ischemic attack (TIA), and cerebral infarction without residual deficits: Secondary | ICD-10-CM

## 2021-08-18 DIAGNOSIS — Z66 Do not resuscitate: Secondary | ICD-10-CM | POA: Diagnosis present

## 2021-08-18 DIAGNOSIS — Z681 Body mass index (BMI) 19 or less, adult: Secondary | ICD-10-CM | POA: Diagnosis not present

## 2021-08-18 DIAGNOSIS — E162 Hypoglycemia, unspecified: Secondary | ICD-10-CM | POA: Diagnosis not present

## 2021-08-18 DIAGNOSIS — E161 Other hypoglycemia: Secondary | ICD-10-CM | POA: Diagnosis not present

## 2021-08-18 DIAGNOSIS — I4819 Other persistent atrial fibrillation: Secondary | ICD-10-CM | POA: Diagnosis not present

## 2021-08-18 DIAGNOSIS — Z85828 Personal history of other malignant neoplasm of skin: Secondary | ICD-10-CM

## 2021-08-18 DIAGNOSIS — W44F3XA Food entering into or through a natural orifice, initial encounter: Secondary | ICD-10-CM

## 2021-08-18 DIAGNOSIS — D6832 Hemorrhagic disorder due to extrinsic circulating anticoagulants: Secondary | ICD-10-CM

## 2021-08-18 DIAGNOSIS — E039 Hypothyroidism, unspecified: Secondary | ICD-10-CM | POA: Diagnosis present

## 2021-08-18 DIAGNOSIS — I2510 Atherosclerotic heart disease of native coronary artery without angina pectoris: Secondary | ICD-10-CM | POA: Diagnosis not present

## 2021-08-18 DIAGNOSIS — I739 Peripheral vascular disease, unspecified: Secondary | ICD-10-CM | POA: Diagnosis present

## 2021-08-18 DIAGNOSIS — Z7401 Bed confinement status: Secondary | ICD-10-CM | POA: Diagnosis not present

## 2021-08-18 DIAGNOSIS — Z7901 Long term (current) use of anticoagulants: Secondary | ICD-10-CM

## 2021-08-18 DIAGNOSIS — I4891 Unspecified atrial fibrillation: Secondary | ICD-10-CM | POA: Diagnosis not present

## 2021-08-18 DIAGNOSIS — Z952 Presence of prosthetic heart valve: Secondary | ICD-10-CM

## 2021-08-18 DIAGNOSIS — Z91048 Other nonmedicinal substance allergy status: Secondary | ICD-10-CM

## 2021-08-18 DIAGNOSIS — Z515 Encounter for palliative care: Secondary | ICD-10-CM | POA: Diagnosis not present

## 2021-08-18 DIAGNOSIS — I129 Hypertensive chronic kidney disease with stage 1 through stage 4 chronic kidney disease, or unspecified chronic kidney disease: Secondary | ICD-10-CM | POA: Diagnosis present

## 2021-08-18 DIAGNOSIS — Z7952 Long term (current) use of systemic steroids: Secondary | ICD-10-CM

## 2021-08-18 DIAGNOSIS — J90 Pleural effusion, not elsewhere classified: Secondary | ICD-10-CM | POA: Diagnosis present

## 2021-08-18 DIAGNOSIS — Z79899 Other long term (current) drug therapy: Secondary | ICD-10-CM

## 2021-08-18 DIAGNOSIS — R1319 Other dysphagia: Secondary | ICD-10-CM | POA: Diagnosis not present

## 2021-08-18 DIAGNOSIS — R531 Weakness: Secondary | ICD-10-CM | POA: Diagnosis not present

## 2021-08-18 DIAGNOSIS — N1831 Chronic kidney disease, stage 3a: Secondary | ICD-10-CM | POA: Diagnosis present

## 2021-08-18 DIAGNOSIS — Z833 Family history of diabetes mellitus: Secondary | ICD-10-CM

## 2021-08-18 DIAGNOSIS — R791 Abnormal coagulation profile: Secondary | ICD-10-CM | POA: Diagnosis not present

## 2021-08-18 DIAGNOSIS — E038 Other specified hypothyroidism: Secondary | ICD-10-CM | POA: Diagnosis not present

## 2021-08-18 DIAGNOSIS — Z87891 Personal history of nicotine dependence: Secondary | ICD-10-CM

## 2021-08-18 DIAGNOSIS — T18128D Food in esophagus causing other injury, subsequent encounter: Secondary | ICD-10-CM | POA: Diagnosis not present

## 2021-08-18 DIAGNOSIS — T45515A Adverse effect of anticoagulants, initial encounter: Secondary | ICD-10-CM | POA: Diagnosis present

## 2021-08-18 DIAGNOSIS — R1310 Dysphagia, unspecified: Secondary | ICD-10-CM | POA: Diagnosis not present

## 2021-08-18 DIAGNOSIS — K219 Gastro-esophageal reflux disease without esophagitis: Secondary | ICD-10-CM | POA: Diagnosis present

## 2021-08-18 DIAGNOSIS — X58XXXA Exposure to other specified factors, initial encounter: Secondary | ICD-10-CM | POA: Diagnosis present

## 2021-08-18 DIAGNOSIS — Z8261 Family history of arthritis: Secondary | ICD-10-CM

## 2021-08-18 DIAGNOSIS — M1990 Unspecified osteoarthritis, unspecified site: Secondary | ICD-10-CM | POA: Diagnosis present

## 2021-08-18 DIAGNOSIS — Z85118 Personal history of other malignant neoplasm of bronchus and lung: Secondary | ICD-10-CM

## 2021-08-18 DIAGNOSIS — J189 Pneumonia, unspecified organism: Secondary | ICD-10-CM | POA: Diagnosis not present

## 2021-08-18 DIAGNOSIS — T451X5A Adverse effect of antineoplastic and immunosuppressive drugs, initial encounter: Secondary | ICD-10-CM | POA: Diagnosis present

## 2021-08-18 DIAGNOSIS — Z823 Family history of stroke: Secondary | ICD-10-CM

## 2021-08-18 DIAGNOSIS — I499 Cardiac arrhythmia, unspecified: Secondary | ICD-10-CM | POA: Diagnosis not present

## 2021-08-18 DIAGNOSIS — R000 Tachycardia, unspecified: Secondary | ICD-10-CM | POA: Diagnosis not present

## 2021-08-18 HISTORY — DX: Unspecified atrial fibrillation: I48.91

## 2021-08-18 LAB — URINALYSIS, ROUTINE W REFLEX MICROSCOPIC
Bilirubin Urine: NEGATIVE
Glucose, UA: NEGATIVE mg/dL
Ketones, ur: 5 mg/dL — AB
Leukocytes,Ua: NEGATIVE
Nitrite: NEGATIVE
Protein, ur: 30 mg/dL — AB
Specific Gravity, Urine: 1.018 (ref 1.005–1.030)
pH: 5 (ref 5.0–8.0)

## 2021-08-18 LAB — COMPREHENSIVE METABOLIC PANEL
ALT: 22 U/L (ref 0–44)
AST: 18 U/L (ref 15–41)
Albumin: 3.4 g/dL — ABNORMAL LOW (ref 3.5–5.0)
Alkaline Phosphatase: 77 U/L (ref 38–126)
Anion gap: 8 (ref 5–15)
BUN: 21 mg/dL (ref 8–23)
CO2: 27 mmol/L (ref 22–32)
Calcium: 8.7 mg/dL — ABNORMAL LOW (ref 8.9–10.3)
Chloride: 105 mmol/L (ref 98–111)
Creatinine, Ser: 1.08 mg/dL — ABNORMAL HIGH (ref 0.44–1.00)
GFR, Estimated: 53 mL/min — ABNORMAL LOW (ref >60)
Glucose, Bld: 101 mg/dL — ABNORMAL HIGH (ref 70–99)
Potassium: 3.5 mmol/L (ref 3.5–5.1)
Sodium: 140 mmol/L (ref 135–145)
Total Bilirubin: 1 mg/dL (ref 0.3–1.2)
Total Protein: 6.5 g/dL (ref 6.5–8.1)

## 2021-08-18 LAB — PROTIME-INR
INR: 7.7 (ref 0.8–1.2)
Prothrombin Time: 64.8 seconds — ABNORMAL HIGH (ref 11.4–15.2)

## 2021-08-18 LAB — CBC
HCT: 47.7 % — ABNORMAL HIGH (ref 36.0–46.0)
Hemoglobin: 15 g/dL (ref 12.0–15.0)
MCH: 30.2 pg (ref 26.0–34.0)
MCHC: 31.4 g/dL (ref 30.0–36.0)
MCV: 96.2 fL (ref 80.0–100.0)
Platelets: 135 10*3/uL — ABNORMAL LOW (ref 150–400)
RBC: 4.96 MIL/uL (ref 3.87–5.11)
RDW: 21.4 % — ABNORMAL HIGH (ref 11.5–15.5)
WBC: 10.4 10*3/uL (ref 4.0–10.5)
nRBC: 0 % (ref 0.0–0.2)

## 2021-08-18 LAB — TROPONIN I (HIGH SENSITIVITY)
Troponin I (High Sensitivity): 26 ng/L — ABNORMAL HIGH (ref <18)
Troponin I (High Sensitivity): 25 ng/L — ABNORMAL HIGH (ref <18)

## 2021-08-18 MED ORDER — AMIODARONE HCL 200 MG PO TABS
200.0000 mg | ORAL_TABLET | Freq: Two times a day (BID) | ORAL | Status: DC
  Administered 2021-08-18 – 2021-08-24 (×11): 200 mg via ORAL
  Filled 2021-08-18 (×12): qty 1

## 2021-08-18 MED ORDER — LEVOTHYROXINE SODIUM 50 MCG PO TABS
50.0000 ug | ORAL_TABLET | Freq: Every day | ORAL | Status: DC
  Administered 2021-08-19 – 2021-08-24 (×5): 50 ug via ORAL
  Filled 2021-08-18 (×6): qty 1

## 2021-08-18 MED ORDER — SODIUM CHLORIDE 0.9 % IV BOLUS
1000.0000 mL | Freq: Once | INTRAVENOUS | Status: AC
  Administered 2021-08-18: 1000 mL via INTRAVENOUS

## 2021-08-18 MED ORDER — ALBUTEROL SULFATE HFA 108 (90 BASE) MCG/ACT IN AERS: 2.0000 | INHALATION_SPRAY | Freq: Two times a day (BID) | RESPIRATORY_TRACT | Status: DC | PRN

## 2021-08-18 MED ORDER — METOPROLOL SUCCINATE ER 25 MG PO TB24
25.0000 mg | ORAL_TABLET | Freq: Every day | ORAL | Status: DC
  Administered 2021-08-18 – 2021-08-24 (×7): 25 mg via ORAL
  Filled 2021-08-18 (×7): qty 1

## 2021-08-18 MED ORDER — PANTOPRAZOLE SODIUM 40 MG IV SOLR
40.0000 mg | Freq: Two times a day (BID) | INTRAVENOUS | Status: DC
  Administered 2021-08-18 – 2021-08-23 (×11): 40 mg via INTRAVENOUS
  Filled 2021-08-18 (×12): qty 10

## 2021-08-18 MED ORDER — GLUCAGON HCL RDNA (DIAGNOSTIC) 1 MG IJ SOLR
1.0000 mg | Freq: Once | INTRAMUSCULAR | Status: AC
  Administered 2021-08-18: 1 mg via INTRAVENOUS
  Filled 2021-08-18: qty 1

## 2021-08-18 MED ORDER — ROSUVASTATIN CALCIUM 20 MG PO TABS
20.0000 mg | ORAL_TABLET | Freq: Every day | ORAL | Status: DC
  Administered 2021-08-18 – 2021-08-23 (×5): 20 mg via ORAL
  Filled 2021-08-18 (×6): qty 1

## 2021-08-18 MED ORDER — ACETAMINOPHEN 325 MG PO TABS: 650.0000 mg | ORAL_TABLET | Freq: Four times a day (QID) | ORAL | Status: DC | PRN

## 2021-08-18 MED ORDER — IRBESARTAN 150 MG PO TABS
150.0000 mg | ORAL_TABLET | Freq: Every day | ORAL | Status: DC
  Administered 2021-08-18 – 2021-08-24 (×6): 150 mg via ORAL
  Filled 2021-08-18 (×7): qty 1

## 2021-08-18 MED ORDER — PROCHLORPERAZINE EDISYLATE 10 MG/2ML IJ SOLN: 10.0000 mg | Freq: Four times a day (QID) | INTRAMUSCULAR | Status: DC | PRN

## 2021-08-18 MED ORDER — OXYCODONE HCL 5 MG PO TABS
5.0000 mg | ORAL_TABLET | ORAL | Status: DC | PRN
  Administered 2021-08-22 – 2021-08-23 (×2): 5 mg via ORAL
  Filled 2021-08-18 (×2): qty 1

## 2021-08-18 MED ORDER — VITAMIN K1 10 MG/ML IJ SOLN
5.0000 mg | Freq: Once | INTRAVENOUS | Status: AC
  Administered 2021-08-18: 5 mg via INTRAVENOUS
  Filled 2021-08-18: qty 0.5

## 2021-08-18 MED ORDER — ALBUTEROL SULFATE (2.5 MG/3ML) 0.083% IN NEBU: 2.5000 mg | INHALATION_SOLUTION | Freq: Two times a day (BID) | RESPIRATORY_TRACT | Status: DC | PRN

## 2021-08-18 MED ORDER — ACETAMINOPHEN 650 MG RE SUPP: 650.0000 mg | Freq: Four times a day (QID) | RECTAL | Status: DC | PRN

## 2021-08-18 MED ORDER — ONDANSETRON HCL 4 MG/2ML IJ SOLN
4.0000 mg | Freq: Four times a day (QID) | INTRAMUSCULAR | Status: DC | PRN
  Administered 2021-08-19: 4 mg via INTRAVENOUS
  Filled 2021-08-18: qty 2

## 2021-08-18 MED ORDER — ONDANSETRON HCL 4 MG PO TABS: 4.0000 mg | ORAL_TABLET | Freq: Four times a day (QID) | ORAL | Status: DC | PRN

## 2021-08-18 MED ORDER — ONDANSETRON HCL 4 MG/2ML IJ SOLN
4.0000 mg | Freq: Once | INTRAMUSCULAR | Status: AC
  Administered 2021-08-18: 4 mg via INTRAVENOUS
  Filled 2021-08-18: qty 2

## 2021-08-18 NOTE — ED Notes (Signed)
Patient give water at this time. Patient states "it's going down just fine." Patient states that she "still feels like something is stuck."

## 2021-08-18 NOTE — ED Triage Notes (Signed)
Patient arrived via EMS with complaints of feeling like she cannot swallow. Patient states that she ate chicken wings yesterday and feels like something is stuck. Patient also has not been able to take home medications due to swallowing since yesterday.

## 2021-08-18 NOTE — Assessment & Plan Note (Signed)
Continue Crestor 

## 2021-08-18 NOTE — ED Notes (Signed)
Patient educated on need for urine sample. Unable to give sample at this time.

## 2021-08-18 NOTE — H&P (Signed)
History and Physical    Patient: Katelyn [name] ZJI:[id number] DOB: Oct 31, 1940 DOA: 08/18/2021 DOS: the patient was seen and examined on 08/18/2021 PCP: Celene Squibb, MD  Patient coming from: Home  Chief Complaint:  Chief Complaint  Patient presents with   Dysphagia   HPI: Katelyn [name] is a 81 y.o. female with medical history significant of atrial fibrillation, aortic stenosis status post TAVR, CAD, GERD, hyperlipidemia, hypothyroidism, hypertension, history of stroke, and more presents the ED with a chief complaint of foreign body sensation in the esophagus.  Patient reports she has had this sensation since yesterday.  She was eating chicken wings when the sensation started.  She reports it did not feel like there was a bone stuck in her throat, and the chicken wings were bolus.  She reports it did feel like there was a lump in her throat.  Medicine that she was given in the ER made that sensation go away, but she does not know which medication.  She was given glucagon, Zofran, Protonix.  She has had this happen before 2 months ago.  Patient reports that she has had dilation several times.  She reports there is no pain associated with it, and at this time there is no lump feeling either.  She was able to tolerate some water in the ED, but reports that she chronically has dysphagia with liquids and solids at home.  This was just a worsening from her normal.  Patient reports that she has been nauseous and she did vomit after dinner last night.  There was no bloody emesis.  She had undigested small pieces of chicken wings in the emesis.  She had normal bowel movements, her last bowel movement was yesterday.  No blood, no melena.  Patient reports chronic dyspnea with exertion over the last 6 months - 1 year.  She has no other complaints on review of systems.  Patient does not smoke, does not drink alcohol, does not use illicit drugs.  She is vaccinated for COVID.  Patient would like to be DNR. Review  of Systems: As mentioned in the history of present illness. All other systems reviewed and are negative. Past Medical History:  Diagnosis Date   A-fib [facility])    Allergy    rhinitis   Aortic stenosis, severe    a. s/p TAVR 08/2014.   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 April 2012 which revealed mid RCA occlusion with collaterals, 50% LAD stenosis, 30% circumflex stenosis, and 40% marginal stenosis   First degree AV block    GERD (gastroesophageal reflux disease)    Heart murmur    Hyperlipidemia    Hypertension    Hypothyroidism 11/14/2020   Left bundle branch block    LUMBAR RADICULOPATHY, RIGHT    PAF (paroxysmal atrial fibrillation) ([place])    Peripheral vascular disease ([place])    a. s/p aortobifem bypass graft surgery and bilateral fundoplasty by [facility].   Renal artery stenosis ([place])    a. renal artery stenosis (>60% L renal artery) by duplex 04/2020.   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 bilateral subclavian stenosis with antegrade flow by duplex 07/2020   Past Surgical History:  Procedure Laterality Date   AORTO-FEMORAL BYPASS GRAFT Bilateral 06/27/10   BASAL CELL CARCINOMA EXCISION Left    face   CARDIAC CATHETERIZATION N/A 07/25/2014   Procedure: Right/Left Heart Cath and Coronary Angiography;  Surgeon: Troy Sine, MD;  Location: [facility];  Service: Cardiovascular;  Laterality:  N/A;   CATARACT EXTRACTION, BILATERAL Bilateral 09/29/2016   COLONOSCOPY  2008   Dr. Laural Golden: normal.    DILATION AND CURETTAGE OF UTERUS  "2 or 3"   ESOPHAGEAL DILATION N/A 06/26/2021   Procedure: ESOPHAGEAL DILATION;  Surgeon: Daneil Dolin, MD;  [facility];  Service: Endoscopy;  Laterality: N/A;   ESOPHAGOGASTRODUODENOSCOPY N/A 08/18/2017   Dr. Gala Romney: Normal-appearing esophagus status post dilation.  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  Small hiatal hernia.   ESOPHAGOGASTRODUODENOSCOPY (EGD) WITH PROPOFOL N/A 06/26/2021   Procedure: ESOPHAGOGASTRODUODENOSCOPY (EGD) WITH PROPOFOL;  Surgeon: Daneil Dolin, MD;  [facility];  Service: Endoscopy;  Laterality: N/A;   INTERCOSTAL NERVE BLOCK Right 08/15/2020   Procedure: INTERCOSTAL NERVE BLOCK RIGHT;  Surgeon: Melrose Nakayama, MD;  Location: [place];  Service: Thoracic;  Laterality: Right;   IR THORACENTESIS ASP PLEURAL SPACE W/IMG GUIDE  10/07/2020   MALONEY DILATION N/A 08/18/2017   Procedure: Venia Minks DILATION;  Surgeon: Daneil Dolin, MD;  [facility];  Service: Endoscopy;  Laterality: N/A;   MULTIPLE TOOTH EXTRACTIONS     NODE DISSECTION Right 08/15/2020   Procedure: NODE DISSECTION;  Surgeon: Melrose Nakayama, MD;  Location: [place] [facility];  Service: Thoracic;  Laterality: Right;   PORTACATH PLACEMENT Left 01/15/2021   Procedure: INSERTION PORT-A-CATH;  Surgeon: Aviva Signs, MD;  Location: [facility];  Service: General;  Laterality: Left;   PR VEIN BYPASS GRAFT,AORTO-FEM-POP  06/12/10   TEE WITHOUT CARDIOVERSION N/A 08/31/2014   Procedure: TRANSESOPHAGEAL ECHOCARDIOGRAM (TEE);  Surgeon: Rexene Alberts, MD;  Location: [place];  Service: Open Heart Surgery;  Laterality: N/A;   TRANSCATHETER AORTIC VALVE REPLACEMENT, TRANSFEMORAL N/A 08/31/2014   Procedure: TRANSCATHETER AORTIC VALVE REPLACEMENT, TRANSFEMORAL approach;  Surgeon: Rexene Alberts, MD;  Location: [place];  Service: Open Heart Surgery;  Laterality:  N/A;   TUBAL LIGATION  1970's   VAGINAL HYSTERECTOMY  1970's   Partial    Social History:  reports that she quit smoking about 11 years ago. Her smoking use included cigarettes. She started smoking about 69 years ago. She has a 112.00 pack-year smoking history. She has never used smokeless tobacco. She reports that she does not drink alcohol and does not use drugs.  Allergies  Allergen Reactions   Adhesive [Tape] Rash   Latex Rash   Tetanus Toxoids Rash   Wound Dressing Adhesive Rash    Family History  Problem Relation Age of Onset   Heart disease Mother 28       died young   Varicose Veins Mother    Stroke Father    Hypertension Father    Heart disease Father 39       Aneurysm   Heart attack Father    Arthritis Brother  Stroke Brother    Diabetes Daughter    Arthritis Daughter    Heart attack Brother        unsure   Allergic rhinitis Neg Hx    Angioedema Neg Hx    Asthma Neg Hx    Atopy Neg Hx    Eczema Neg Hx    Immunodeficiency Neg Hx    Urticaria Neg Hx     Prior to Admission medications   Medication Sig Start Date End Date Taking? Authorizing Provider  acetaminophen (TYLENOL) 500 MG tablet Take 500 mg by mouth every 6 (six) hours as needed for moderate pain.   Yes [provider]  albuterol (VENTOLIN HFA) 108 (90 Base) MCG/ACT inhaler Inhale 2 puffs into the lungs 2 (two) times daily as needed for wheezing or shortness of breath. 06/12/21  Yes Derek Jack, MD  amiodarone (PACERONE) 200 MG tablet Take 1 tablet (200 mg total) by mouth 2 (two) times daily. 07/23/21  Yes Troy Sine, MD  estradiol (ESTRACE) 2 MG tablet Take 1 tablet (2 mg total) by mouth daily. 10/23/20  Yes Ailene Ards, NP  furosemide (LASIX) 20 MG tablet Take 1 tablet (20 mg total) by mouth daily. 07/04/21  Yes Pennington, Rebekah M, PA-C  ipratropium (ATROVENT) 0.06 % nasal spray USE 1 SPRAY(S) IN EACH NOSTRIL EVERY 6 HOURS AS NEEDED FOR RUNNY NOSE Patient taking differently:  Place 1 spray into both nostrils every 6 (six) hours as needed for rhinitis. 10/22/20  Yes Ailene Ards, NP  irbesartan (AVAPRO) 300 MG tablet Take 0.5 tablets (150 mg total) by mouth daily. Patient taking differently: Take 150 mg by mouth in the morning and at bedtime. 06/27/21  Yes Johnson, Clanford L, MD  levothyroxine (SYNTHROID) 50 MCG tablet Take 1 tablet (50 mcg total) by mouth daily before breakfast. 04/22/21  Yes Derek Jack, MD  meloxicam (MOBIC) 15 MG tablet Take 15 mg by mouth daily as needed for pain. 08/11/21  Yes [provider]  metoCLOPramide (REGLAN) 5 MG tablet Take 2 tablets (10 mg total) by mouth every 8 (eight) hours as needed for nausea, vomiting or refractory nausea / vomiting. Patient taking differently: Take 5 mg by mouth every 8 (eight) hours as needed for nausea, vomiting or refractory nausea / vomiting. 08/04/21  Yes Derek Jack, MD  metoprolol succinate (TOPROL-XL) 25 MG 24 hr tablet Take 1 tablet by mouth once daily Patient taking differently: Take 25 mg by mouth daily. 04/18/21  Yes Troy Sine, MD  pantoprazole (PROTONIX) 40 MG tablet TAKE 1 TABLET BY MOUTH ONCE DAILY BEFORE SUPPER Patient taking differently: Take 40 mg by mouth daily. 07/16/20  Yes Erenest Rasher, PA-C  predniSONE (DELTASONE) 20 MG tablet Take 1 tablet (20 mg total) by mouth daily with breakfast. Patient taking differently: Take 10 mg by mouth daily with breakfast. 08/04/21  Yes Derek Jack, MD  prochlorperazine (COMPAZINE) 10 MG tablet Take 1 tablet (10 mg total) by mouth every 6 (six) hours as needed (Nausea or vomiting). 08/04/21  Yes Derek Jack, MD  rosuvastatin (CRESTOR) 20 MG tablet Take 1 tablet (20 mg total) by mouth at bedtime. 10/23/20  Yes Ailene Ards, NP  warfarin (COUMADIN) 2.5 MG tablet Take 1 tablet (2.5 mg total) by mouth at bedtime. 06/27/21  Yes Johnson, Clanford L, MD  lidocaine-prilocaine (EMLA) cream Apply a small amount to port a cath  site and cover with plastic wrap 1 hour prior to infusion appointments 12/16/20   Derek Jack,  MD  megestrol (MEGACE) 400 MG/10ML suspension Take 10 mLs (400 mg total) by mouth 2 (two) times daily. Patient not taking: Reported on 08/18/2021 08/04/21   Derek Jack, MD  nitroGLYCERIN (NITROSTAT) 0.4 MG SL tablet Place 0.4 mg under the tongue every 5 (five) minutes as needed for chest pain. 04/04/20   [provider]    Physical Exam: Vitals:   08/18/21 1330 08/18/21 1430 08/18/21 1530 08/18/21 1600  BP: (!) 160/85 (!) 171/93 (!) 146/88 (!) 159/95  Pulse: 95 (!) 107 (!) 119   Resp: 14 16 19 19   Temp:    97.8 F (36.6 C)  TempSrc:    Oral  SpO2: 97% 96% 96% 95%  Weight:      Height:       1.  General: Patient transferring from bed to wheelchair at the time of my exam, generalized weakness, but no asymmetric gait or weakness observed   2. Psychiatric: Alert and oriented x 3, mood and behavior normal for situation, pleasant and cooperative with exam   3. Neurologic: Speech and language are normal, face is symmetric, moves all 4 extremities voluntarily, at baseline without acute deficits on limited exam   4. HEENMT:  Head is atraumatic, normocephalic, pupils reactive to light, neck is supple, trachea is midline, mucous membranes are moist   5. Respiratory : Lungs are clear to auscultation bilaterally without wheezing, rhonchi, rales, no cyanosis, no increase in work of breathing or accessory muscle use   6. Cardiovascular : Heart rate normal, rhythm is regular, murmur present, rubs or gallops, no peripheral edema, peripheral pulses palpated   7. Gastrointestinal:  Abdomen is soft, nondistended, nontender to palpation bowel sounds active, no masses or organomegaly palpated   8. Skin:  Skin is warm, dry and intact without rashes, acute lesions, or ulcers on limited exam   9.Musculoskeletal:  No acute deformities or trauma, no asymmetry in tone, no peripheral  edema, peripheral pulses palpated, no tenderness to palpation in the extremities  Data Reviewed: In the ED Temp 97.8, heart rate 65-129, respiratory rate 14-21, blood pressure 146/88 No leukocytosis with white blood cell count 10.4, hemoglobin 15.0 Chemistry is unremarkable Initial troponin 26, repeat ordered at admission and pending INR 7.7 UA shows some bacteria but is not consistent with UTI and patient does not complain of urinary symptoms EKG shows A-fib with rate of 118, QTc 568 Patient was given glucagon, Zofran, Protonix Vitamin K 5 mg and 1 L bolus GI was consulted and plans to do an EGD once INR is closer to normal Admission requested for management of supratherapeutic INR so that GI can do EGD  Echo from 08/14/2021 was reviewed and shows LVH, EF 22-29%, diastolic parameters indeterminate  Assessment and Plan: * Dysphagia - Initially foreign body sensation, then the lump sensation, both of those sensations have resolved -Continue Protonix twice daily -History of esophageal strictures with dilation x3 -GI has been consulted and plans to do an EGD when patient's INR has trended down -Patient was able to tolerate liquids in the ED -N.p.o. except for sips and chips -Continue to monitor  Prolonged QT interval - EKG shows a QTc of 568 -Avoid QT prolonging agents when possible -Monitor on telemetry  Supratherapeutic INR - INR 7.7 -Hold warfarin -Recheck INR in the a.m. -Plan for EGD when INR is trending down  Hypothyroidism - Continue Synthroid -Check TSH in the a.m.  Paroxysmal atrial fibrillation (HCC) - Holding warfarin in the setting of supratherapeutic INR -Continue Pacerone and  beta-blocker -Monitor on telemetry  Mixed hyperlipidemia - Continue Crestor  Essential hypertension - Continue irbesartan and beta-blocker      Advance Care Planning:   Code Status: DNR   Consults: GI  Family Communication: No family at bedside  Severity of Illness: The  appropriate patient status for this patient is OBSERVATION. Observation status is judged to be reasonable and necessary in order to provide the required intensity of service to ensure the patient's safety. The patient's presenting symptoms, physical exam findings, and initial radiographic and laboratory data in the context of their medical condition is felt to place them at decreased risk for further clinical deterioration. Furthermore, it is anticipated that the patient will be medically stable for discharge from the hospital within 2 midnights of admission.   Author: Rolla Plate, DO 08/18/2021 7:09 PM  For on call review [email or web address].

## 2021-08-18 NOTE — Assessment & Plan Note (Signed)
-   Continue irbesartan and beta-blocker

## 2021-08-18 NOTE — Assessment & Plan Note (Signed)
-   Initially foreign body sensation, then the lump sensation, both of those sensations have resolved -Continue Protonix twice daily -History of esophageal strictures with dilation x3 -GI has been consulted and plans to do an EGD when patient's INR has trended down -Patient was able to tolerate liquids in the ED -N.p.o. except for sips and chips -Continue to monitor

## 2021-08-18 NOTE — Assessment & Plan Note (Signed)
-   Holding warfarin in the setting of supratherapeutic INR -Continue Pacerone and beta-blocker -Monitor on telemetry

## 2021-08-18 NOTE — Assessment & Plan Note (Signed)
-   EKG shows a QTc of 568 -Avoid QT prolonging agents when possible -Monitor on telemetry

## 2021-08-18 NOTE — Consult Note (Signed)
Gastroenterology Consult   Referring Provider: No ref. provider found Primary Care Physician:  Celene Squibb, MD Primary Gastroenterologist:  Dr. Gala Romney  Patient ID: Katelyn [name]; [id number]; [birth year]   Admit date: 08/18/2021  LOS: 0 days   Date of Consultation: 08/18/2021  Reason for Consultation:  dysphagia, food impaction  History of Present Illness   Katelyn [name] is a 81 y.o. year old female with history of Afib on warfarin, arthritis, CAD s/p TAVR 2016, lung cancer, PVD, HTN, hypothyroid, HLD, GERD, COPD, CKD, stroke in 1982, and chronic dysphagia who comes to the hospital for evaluation after presenting an episode of food impaction. The patient reports she was eating chicken wings yesterday and she felt the food get stuck in the retrosternal area.  Drooling:yes but patient reports that is baseline for her.  Able to swallow food: No, has regurgitated all solids since Able to drink liquids: Was able to take a small sip this morning and keep it down but had previously regurgitated liquids as well.  Previous reflux symptoms: yes, on pantoprazole 40 mg daily. Weight changes:has lost about 10-15 lbs in the last few months per patient.   Previous episodes: Has had chronic dysphagia and recent dilation in April as stated below. She has had improvement in her symptoms until yesterday.   Medications currently used: Warfarin - last dose: day before yesterday (6/17)  Previous EGD: 06/26/21 - normal esophagus s/p dilation up to 62 Fr maloney and some resistance with 56 Fr. Previous colonoscopy:06/01/06 - normal colonoscopy, small internal hemorrhoids   In the ER, his vital signs were stable wit mild hypertension. She was protecting his airway. Labs were notable for Cr 1.08, GFR 53, Hct 47.7, Plts 135 (above baseline), INR 7.7 (On warfarin).  Recent chest imaging on 6/12 with no evidence of recurrent or metastatic disease, clearing of patchy/peribronchovascular ground-glass , large right  pleural effusion, and new cirrhosis not mentioned on prior imaging.   Patient has not complained of dysphagia since her dilation in April. She continues to lose weight due to lack of appetite. She follows with Dr. Jamey Reas for her right upper lobe lung cancer and was put on megace for appetite stimulation that has been unhelpful. She has been drinking 2-3 ensure a day at home.    Weights: 05/27/21 148 lb 8 oz (67.4 kg)  05/13/21 141 lb 1.6 oz (64 kg)  05/05/21 150 lb (68 kg)   Today 61.2 kg (134.6 lb)     Past Medical History:  Diagnosis Date   A-fib ([place])    Allergy    rhinitis   Aortic stenosis, severe    a. s/p TAVR 08/2014.   Arthritis    spine and various joints   Cancer of upper lobe of right lung ([place]) 09/04/2020   Carotid artery disease ([place])    Cataract    Cerebrovascular disease 12/05/2018   Childhood asthma    Chronic bronchitis ([place])        Chronic kidney disease 02/17/2021   Claudication ([place])    COPD GOLD II 07/18/2020   Coronary artery disease    a. April 2012 which revealed mid RCA occlusion with collaterals, 50% LAD stenosis, 30% circumflex stenosis, and 40% marginal stenosis   First degree AV block    GERD (gastroesophageal reflux disease)    Heart murmur    Hyperlipidemia    Hypertension    Hypothyroidism 11/14/2020   Left bundle branch block    LUMBAR RADICULOPATHY, RIGHT  PAF (paroxysmal atrial fibrillation) (HCC)    Peripheral vascular disease ([place])    a. s/p aortobifem bypass graft surgery and bilateral fundoplasty by [facility].   Renal artery stenosis ([place])    a. renal artery stenosis (>60% L renal artery) by duplex 04/2020.   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  bilateral subclavian stenosis with antegrade flow by duplex 07/2020    Past Surgical History:  Procedure Laterality Date   AORTO-FEMORAL BYPASS GRAFT Bilateral 06/27/10   BASAL CELL CARCINOMA EXCISION Left    face   CARDIAC CATHETERIZATION N/A 07/25/2014   Procedure: Right/Left Heart Cath and Coronary Angiography;  Surgeon: Troy Sine, MD;  Location: [facility];  Service: Cardiovascular;  Laterality: N/A;   CATARACT EXTRACTION, BILATERAL Bilateral 09/29/2016   COLONOSCOPY  2008   Dr. Laural Golden: normal.    DILATION AND CURETTAGE OF UTERUS  "2 or 3"   ESOPHAGEAL DILATION N/A 06/26/2021   Procedure: ESOPHAGEAL DILATION;  Surgeon: Daneil Dolin, MD;  [facility];  Service: Endoscopy;  Laterality: N/A;   ESOPHAGOGASTRODUODENOSCOPY N/A 08/18/2017   Dr. Gala Romney: Normal-appearing esophagus status post dilation.  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  Small hiatal hernia.   ESOPHAGOGASTRODUODENOSCOPY (EGD) WITH PROPOFOL N/A 06/26/2021   Procedure: ESOPHAGOGASTRODUODENOSCOPY (EGD) WITH PROPOFOL;  Surgeon: Daneil Dolin, MD;  [facility];  Service: Endoscopy;  Laterality: N/A;   INTERCOSTAL NERVE BLOCK Right 08/15/2020   Procedure: INTERCOSTAL NERVE BLOCK RIGHT;  Surgeon: Melrose Nakayama, MD;  Location: [facility];  Service: Thoracic;  Laterality: Right;   IR THORACENTESIS ASP PLEURAL SPACE W/IMG GUIDE  10/07/2020   MALONEY DILATION N/A 08/18/2017   Procedure: Venia Minks DILATION;  Surgeon: Daneil Dolin, MD;  [facility];  Service: Endoscopy;  Laterality: N/A;   MULTIPLE TOOTH EXTRACTIONS     NODE DISSECTION Right 08/15/2020   Procedure: NODE DISSECTION;  Surgeon: Melrose Nakayama, MD;  Location: [facility];  Service: Thoracic;  Laterality: Right;   PORTACATH PLACEMENT Left 01/15/2021   Procedure: INSERTION PORT-A-CATH;  Surgeon: Aviva Signs, MD;  Location: [facility];  Service: General;  Laterality: Left;   PR VEIN BYPASS GRAFT,AORTO-FEM-POP  06/12/10   TEE  WITHOUT CARDIOVERSION N/A 08/31/2014   Procedure: TRANSESOPHAGEAL ECHOCARDIOGRAM (TEE);  Surgeon: Rexene Alberts, MD;  Location: [facility];  Service: Open Heart Surgery;  Laterality: N/A;   TRANSCATHETER AORTIC VALVE REPLACEMENT, TRANSFEMORAL N/A 08/31/2014   Procedure: TRANSCATHETER AORTIC VALVE REPLACEMENT, TRANSFEMORAL approach;  Surgeon: Rexene Alberts, MD;  Location: [facility];  Service: Open Heart Surgery;  Laterality: N/A;   TUBAL LIGATION  1970's   VAGINAL HYSTERECTOMY  1970's   Partial     Prior to Admission medications   Medication Sig Start Date End Date Taking? Authorizing Provider  acetaminophen (TYLENOL) 500 MG tablet Take 500 mg by mouth every 6 (six) hours as needed for moderate pain.    [provider]  albuterol (VENTOLIN HFA) 108 (90 Base) MCG/ACT inhaler Inhale 2 puffs into the lungs 2 (two) times daily as needed for wheezing or shortness of breath. 06/12/21   Derek Jack, MD  amiodarone (PACERONE) 200 MG tablet Take 1 tablet (  200 mg total) by mouth 2 (two) times daily. 07/23/21   Troy Sine, MD  estradiol (ESTRACE) 2 MG tablet Take 1 tablet (2 mg total) by mouth daily. 10/23/20   Ailene Ards, NP  furosemide (LASIX) 20 MG tablet Take 1 tablet (20 mg total) by mouth daily. 07/04/21   Harriett Rush, PA-C  ipratropium (ATROVENT) 0.06 % nasal spray USE 1 SPRAY(S) IN EACH NOSTRIL EVERY 6 HOURS AS NEEDED FOR RUNNY NOSE Patient taking differently: Place 1 spray into both nostrils every 6 (six) hours as needed for rhinitis. 10/22/20   Ailene Ards, NP  irbesartan (AVAPRO) 300 MG tablet Take 0.5 tablets (150 mg total) by mouth daily. 06/27/21   Murlean Iba, MD  levothyroxine (SYNTHROID) 50 MCG tablet Take 1 tablet (50 mcg total) by mouth daily before breakfast. 04/22/21   Derek Jack, MD  lidocaine-prilocaine (EMLA) cream Apply a small amount to port a cath site and cover with plastic wrap 1 hour prior to infusion appointments 12/16/20   Derek Jack, MD  megestrol (MEGACE) 400 MG/10ML suspension Take 10 mLs (400 mg total) by mouth 2 (two) times daily. 08/04/21   Derek Jack, MD  meloxicam (MOBIC) 15 MG tablet Take 15 mg by mouth daily. 08/11/21   [provider]  metoCLOPramide (REGLAN) 5 MG tablet Take 2 tablets (10 mg total) by mouth every 8 (eight) hours as needed for nausea, vomiting or refractory nausea / vomiting. 08/04/21   Derek Jack, MD  metoprolol succinate (TOPROL-XL) 25 MG 24 hr tablet Take 1 tablet by mouth once daily Patient taking differently: Take 25 mg by mouth daily. 04/18/21   Troy Sine, MD  nitroGLYCERIN (NITROSTAT) 0.4 MG SL tablet Place 0.4 mg under the tongue every 5 (five) minutes as needed for chest pain. 04/04/20   [provider]  nystatin (MYCOSTATIN) 100000 UNIT/ML suspension nystatin 100,000 unit/mL oral suspension  TAKE 5 ML BY MOUTH 4 TIMES DAILY    [provider]  oxyCODONE (OXY IR/ROXICODONE) 5 MG immediate release tablet Take 1 tablet (5 mg total) by mouth at bedtime as needed for severe pain. 01/07/21   Derek Jack, MD  pantoprazole (PROTONIX) 40 MG tablet TAKE 1 TABLET BY MOUTH ONCE DAILY BEFORE SUPPER Patient taking differently: Take 40 mg by mouth daily. 07/16/20   Erenest Rasher, PA-C  predniSONE (DELTASONE) 20 MG tablet Take 1 tablet (20 mg total) by mouth daily with breakfast. 08/04/21   Derek Jack, MD  prochlorperazine (COMPAZINE) 10 MG tablet Take 1 tablet (10 mg total) by mouth every 6 (six) hours as needed (Nausea or vomiting). 08/04/21   Derek Jack, MD  rosuvastatin (CRESTOR) 20 MG tablet Take 1 tablet (20 mg total) by mouth at bedtime. 10/23/20   Ailene Ards, NP  warfarin (COUMADIN) 2.5 MG tablet Take 1 tablet (2.5 mg total) by mouth at bedtime. 06/27/21   Murlean Iba, MD    No current facility-administered medications for this encounter.   Current Outpatient Medications  Medication Sig Dispense Refill    acetaminophen (TYLENOL) 500 MG tablet Take 500 mg by mouth every 6 (six) hours as needed for moderate pain.     albuterol (VENTOLIN HFA) 108 (90 Base) MCG/ACT inhaler Inhale 2 puffs into the lungs 2 (two) times daily as needed for wheezing or shortness of breath. 8 g 2   amiodarone (PACERONE) 200 MG tablet Take 1 tablet (200 mg total) by mouth 2 (two) times daily. 60 tablet 6  estradiol (ESTRACE) 2 MG tablet Take 1 tablet (2 mg total) by mouth daily. 90 tablet 0   furosemide (LASIX) 20 MG tablet Take 1 tablet (20 mg total) by mouth daily. 30 tablet 4   ipratropium (ATROVENT) 0.06 % nasal spray USE 1 SPRAY(S) IN EACH NOSTRIL EVERY 6 HOURS AS NEEDED FOR RUNNY NOSE (Patient taking differently: Place 1 spray into both nostrils every 6 (six) hours as needed for rhinitis.) 15 mL 2   irbesartan (AVAPRO) 300 MG tablet Take 0.5 tablets (150 mg total) by mouth daily. 90 tablet 0   levothyroxine (SYNTHROID) 50 MCG tablet Take 1 tablet (50 mcg total) by mouth daily before breakfast. 30 tablet 3   lidocaine-prilocaine (EMLA) cream Apply a small amount to port a cath site and cover with plastic wrap 1 hour prior to infusion appointments 30 g 3   megestrol (MEGACE) 400 MG/10ML suspension Take 10 mLs (400 mg total) by mouth 2 (two) times daily. 480 mL 3   meloxicam (MOBIC) 15 MG tablet Take 15 mg by mouth daily.     metoCLOPramide (REGLAN) 5 MG tablet Take 2 tablets (10 mg total) by mouth every 8 (eight) hours as needed for nausea, vomiting or refractory nausea / vomiting. 60 tablet 3   metoprolol succinate (TOPROL-XL) 25 MG 24 hr tablet Take 1 tablet by mouth once daily (Patient taking differently: Take 25 mg by mouth daily.) 90 tablet 0   nitroGLYCERIN (NITROSTAT) 0.4 MG SL tablet Place 0.4 mg under the tongue every 5 (five) minutes as needed for chest pain.     nystatin (MYCOSTATIN) 100000 UNIT/ML suspension nystatin 100,000 unit/mL oral suspension  TAKE 5 ML BY MOUTH 4 TIMES DAILY     oxyCODONE (OXY  IR/ROXICODONE) 5 MG immediate release tablet Take 1 tablet (5 mg total) by mouth at bedtime as needed for severe pain. 30 tablet 0   pantoprazole (PROTONIX) 40 MG tablet TAKE 1 TABLET BY MOUTH ONCE DAILY BEFORE SUPPER (Patient taking differently: Take 40 mg by mouth daily.) 90 tablet 3   predniSONE (DELTASONE) 20 MG tablet Take 1 tablet (20 mg total) by mouth daily with breakfast. 30 tablet 0   prochlorperazine (COMPAZINE) 10 MG tablet Take 1 tablet (10 mg total) by mouth every 6 (six) hours as needed (Nausea or vomiting). 60 tablet 3   rosuvastatin (CRESTOR) 20 MG tablet Take 1 tablet (20 mg total) by mouth at bedtime. 90 tablet 0   warfarin (COUMADIN) 2.5 MG tablet Take 1 tablet (2.5 mg total) by mouth at bedtime.      Allergies as of 08/18/2021 - Review Complete 08/18/2021  Allergen Reaction Noted   Adhesive [tape] Rash 09/19/2010   Latex Rash 09/19/2010   Tetanus toxoids Rash 08/29/2014   Wound dressing adhesive Rash 09/19/2010    Family History  Problem Relation Age of Onset   Heart disease Mother 27       died young   Varicose Veins Mother    Stroke Father    Hypertension Father    Heart disease Father 93       Aneurysm   Heart attack Father    Arthritis Brother    Stroke Brother    Diabetes Daughter    Arthritis Daughter    Heart attack Brother        unsure   Allergic rhinitis Neg Hx    Angioedema Neg Hx    Asthma Neg Hx    Atopy Neg Hx    Eczema Neg Hx  Immunodeficiency Neg Hx    Urticaria Neg Hx     Social History   Socioeconomic History   Marital status: Widowed    Spouse name: bobby   Number of children: 1   Years of education: Not on file   Highest education level: High school graduate  Occupational History   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 Retired,used to work at [employer].Lives alone.         Patient has one daughter, 3 grands   Likes to crochet   Works in [employer].    Social Determinants of Health   Financial Resource Strain: Low Risk  (03/10/2017)   Overall Financial Resource Strain (CARDIA)    Difficulty of Paying Living Expenses: Not very hard  Food Insecurity: No Food Insecurity (12/17/2020)   Hunger Vital Sign    Worried About Running Out of Food in the Last Year: Never true    Ran Out of Food in the Last Year: Never true  Transportation Needs: No Transportation Needs (12/17/2020)   PRAPARE - Hydrologist (Medical): No    Lack of Transportation (Non-Medical): No  Physical Activity: Inactive (03/10/2017)   Exercise Vital Sign    Days of Exercise per Week: 0 days    Minutes of Exercise per Session: 0 min  Stress: No Stress Concern Present (03/10/2017)   [place]    Feeling of Stress : Only a little  Social Connections: Unknown (03/10/2017)   Social Connection and Isolation Panel [NHANES]    Frequency of Communication with Friends and Family: More than three times a week    Frequency of Social Gatherings with Friends and Family: More than three times a week    Attends Religious Services: Patient refused    Active Member of Clubs or Organizations: Patient refused    Attends Archivist Meetings: Never    Marital Status: Married  Human resources officer Violence: Not At Risk (03/10/2017)   Humiliation, Afraid, Rape, and Kick questionnaire    Fear of Current or Ex-Partner: No    Emotionally Abused: No    Physically Abused: No     Sexually Abused: No     Review of Systems   Gen: Denies any fever, chills, loss of appetite, change in weight or weight loss CV: Denies chest pain, heart palpitations, syncope, edema  Resp: Denies shortness of breath with rest, cough, wheezing, coughing up blood, and pleurisy. GI: see HPI GU : Denies urinary burning, blood in urine, urinary frequency, and urinary incontinence. MS: Denies joint pain, limitation of movement, swelling, cramps, and atrophy.  Derm: Denies rash, itching, dry skin, hives. Psych: Denies depression, anxiety, memory loss, hallucinations, and confusion. Heme: Denies bruising or bleeding Neuro:  Denies any headaches, dizziness, paresthesias, shaking  Physical Exam   Vital Signs  in last 24 hours: Temp:  [97.8 F (36.6 C)] 97.8 F (36.6 C) (06/19 1015) Pulse Rate:  [65-129] 97 (06/19 1230) Resp:  [18-21] 18 (06/19 1230) BP: (130-151)/(84-99) 151/99 (06/19 1230) SpO2:  [94 %-97 %] 94 % (06/19 1230) Weight:  [61.2 kg] 61.2 kg (06/19 0943)    General:   Alert,  frail, thin, pleasant and cooperative in NAD Head:  Normocephalic and atraumatic. Eyes:  Sclera clear, no icterus.   Conjunctiva pink. Ears:  Normal auditory acuity. Lungs:  Clear throughout to auscultation. No wheezes, crackles, or rhonchi. No acute distress. Heart:  Regular rate and rhythm; no murmurs, clicks, rubs,  or gallops. Abdomen:  Soft, nontender and nondistended. No masses, hepatosplenomegaly or hernias noted. Normal bowel sounds, without guarding, and without rebound.   Rectal: deferred   Neurologic:  Alert and  oriented x4. Skin:  Intact without significant lesions or rashes. Psych:  Alert and cooperative. Normal mood and affect.  Intake/Output from previous day: No intake/output data recorded. Intake/Output this shift: No intake/output data recorded.    Labs/Studies   Recent Labs Recent Labs    08/18/21 1059  WBC 10.4  HGB 15.0  HCT 47.7*  PLT 135*   BMET Recent  Labs    08/18/21 1059  NA 140  K 3.5  CL 105  CO2 27  GLUCOSE 101*  BUN 21  CREATININE 1.08*  CALCIUM 8.7*   LFT Recent Labs    08/18/21 1059  PROT 6.5  ALBUMIN 3.4*  AST 18  ALT 22  ALKPHOS 77  BILITOT 1.0   PT/INR Recent Labs    08/18/21 1059  LABPROT 64.8*  INR 7.7*   Hepatitis Panel No results for input(s): "HEPBSAG", "HCVAB", "HEPAIGM", "HEPBIGM" in the last 72 hours. C-Diff No results for input(s): "CDIFFTOX" in the last 72 hours.  Radiology/Studies No results found.   Assessment   Katelyn [name] is a 81 y.o. year old female history of Afib on warfarin, arthritis, CAD s/p TAVR 2016, lung cancer, PVD, HTN, hypothyroid, HLD, GERD, COPD, CKD, stroke in 1982, and chronic dysphagia who comes to the hospital for evaluation after presenting an episode of food impaction. The patient reports she was eating chicken wings yesterday and she felt the food get stuck in the retrosternal area. Gi consulted for further evaluation and management.  Dysphagia/Possible food impaction: Patient recently had EGD with dilation in April 2023 with improvement in symptoms until yesterday. She ate some chicken wings then felt like her food got stuck in her lower esophagus. She has since been unable to keep any solids or liquids down without regurgitation. She was only able to keep down a few very tiny sips this morning. She likely would benefit from EGD due to possible food impaction however her INR is elevated to 7.7 secondary to warfarin. This will be held and she is receiving Vitamin K. In April her INR was elevated > 10 and she received Vitamin K and FFP then as well for correction. She should remain NPO and   Cirrhosis: Noted on recent chest CT imaging on 6/12. LFTs within normal limits. She has mild chronic thrombocytopenia. No evidence of cirrhosis or fatty liver on prior imaging. Recent PET scan in March 2023 without hypermetabolic activity in the liver. Should have follow up for more  dedicated liver imaging and cirrhosis management outpatient.   Plan / Recommendations   Keep NPO Hold warfarin IV PPI BID Vitamin K for INR reversal Possibly need to consider FFP as well.  Daily INR EGD once INR improved to 1.5 or less Further cirrhosis evaluation and management on outpatient basis.     08/18/2021, 1:09 PM  Venetia Night, MSN, FNP-BC, AGACNP-BC [facility]

## 2021-08-18 NOTE — ED Notes (Addendum)
Patient placed on bedpan at this time. Patient unable to urinate.

## 2021-08-18 NOTE — ED Notes (Signed)
Materials called at this time for power port access kit.

## 2021-08-18 NOTE — ED Notes (Signed)
Critical Lab: INR 7.7, provider notified.

## 2021-08-18 NOTE — ED Provider Notes (Signed)
[facility] Provider Note   CSN: [id number] Arrival date & time: 08/18/21  0940     History  Chief Complaint  Patient presents with   Dysphagia    Katelyn [name] is a 81 y.o. female.  Patient c/o trouble swallowing on chronic/recurrent basis for past several months, but worse in past two days. States was eating chicken yesterday, and felt as if got stuck in lower esophagus area. Symptoms acute onset, moderate, persistent. Family notes generally poor appetite for months, w wt loss. Indicates trouble swallowing her shakes and other foods for months. No fever or chills. No chest pain. No sob. No abd pain. No bilious emesis. Having bms. No back/flank pain.   The history is provided by the patient.       Home Medications Prior to Admission medications   Medication Sig Start Date End Date Taking? Authorizing Provider  acetaminophen (TYLENOL) 500 MG tablet Take 500 mg by mouth every 6 (six) hours as needed for moderate pain.    [provider]  albuterol (VENTOLIN HFA) 108 (90 Base) MCG/ACT inhaler Inhale 2 puffs into the lungs 2 (two) times daily as needed for wheezing or shortness of breath. 06/12/21   Derek Jack, MD  amiodarone (PACERONE) 200 MG tablet Take 1 tablet (200 mg total) by mouth 2 (two) times daily. 07/23/21   Troy Sine, MD  estradiol (ESTRACE) 2 MG tablet Take 1 tablet (2 mg total) by mouth daily. 10/23/20   Ailene Ards, NP  furosemide (LASIX) 20 MG tablet Take 1 tablet (20 mg total) by mouth daily. 07/04/21   Harriett Rush, PA-C  ipratropium (ATROVENT) 0.06 % nasal spray USE 1 SPRAY(S) IN EACH NOSTRIL EVERY 6 HOURS AS NEEDED FOR RUNNY NOSE Patient taking differently: Place 1 spray into both nostrils every 6 (six) hours as needed for rhinitis. 10/22/20   Ailene Ards, NP  irbesartan (AVAPRO) 300 MG tablet Take 0.5 tablets (150 mg total) by mouth daily. 06/27/21   Murlean Iba, MD  levothyroxine (SYNTHROID) 50 MCG  tablet Take 1 tablet (50 mcg total) by mouth daily before breakfast. 04/22/21   Derek Jack, MD  lidocaine-prilocaine (EMLA) cream Apply a small amount to port a cath site and cover with plastic wrap 1 hour prior to infusion appointments 12/16/20   Derek Jack, MD  megestrol (MEGACE) 400 MG/10ML suspension Take 10 mLs (400 mg total) by mouth 2 (two) times daily. 08/04/21   Derek Jack, MD  meloxicam (MOBIC) 15 MG tablet Take 15 mg by mouth daily. 08/11/21   [provider]  metoCLOPramide (REGLAN) 5 MG tablet Take 2 tablets (10 mg total) by mouth every 8 (eight) hours as needed for nausea, vomiting or refractory nausea / vomiting. 08/04/21   Derek Jack, MD  metoprolol succinate (TOPROL-XL) 25 MG 24 hr tablet Take 1 tablet by mouth once daily Patient taking differently: Take 25 mg by mouth daily. 04/18/21   Troy Sine, MD  nitroGLYCERIN (NITROSTAT) 0.4 MG SL tablet Place 0.4 mg under the tongue every 5 (five) minutes as needed for chest pain. 04/04/20   [provider]  nystatin (MYCOSTATIN) 100000 UNIT/ML suspension nystatin 100,000 unit/mL oral suspension  TAKE 5 ML BY MOUTH 4 TIMES DAILY    [provider]  oxyCODONE (OXY IR/ROXICODONE) 5 MG immediate release tablet Take 1 tablet (5 mg total) by mouth at bedtime as needed for severe pain. 01/07/21   Derek Jack, MD  pantoprazole (PROTONIX) 40 MG tablet  TAKE 1 TABLET BY MOUTH ONCE DAILY BEFORE SUPPER Patient taking differently: Take 40 mg by mouth daily. 07/16/20   Erenest Rasher, PA-C  predniSONE (DELTASONE) 20 MG tablet Take 1 tablet (20 mg total) by mouth daily with breakfast. 08/04/21   Derek Jack, MD  prochlorperazine (COMPAZINE) 10 MG tablet Take 1 tablet (10 mg total) by mouth every 6 (six) hours as needed (Nausea or vomiting). 08/04/21   Derek Jack, MD  rosuvastatin (CRESTOR) 20 MG tablet Take 1 tablet (20 mg total) by mouth at bedtime. 10/23/20   Ailene Ards, NP  warfarin (COUMADIN) 2.5 MG tablet Take 1 tablet (2.5 mg total) by mouth at bedtime. 06/27/21   Johnson, Clanford L, MD      Allergies    Adhesive [tape], Latex, Tetanus toxoids, and Wound dressing adhesive    Review of Systems   Review of Systems  Constitutional:  Negative for chills and fever.  HENT:  Negative for sore throat.   Eyes:  Negative for redness.  Respiratory:  Negative for cough and shortness of breath.   Cardiovascular:  Negative for chest pain.  Gastrointestinal:  Negative for abdominal pain and diarrhea.  Genitourinary:  Negative for dysuria and flank pain.  Musculoskeletal:  Negative for back pain and neck pain.  Skin:  Negative for rash.  Neurological:  Negative for headaches.  Hematological:        On coumadin, denies abnormal bruising or bleeding.   Psychiatric/Behavioral:  Negative for confusion.     Physical Exam Updated Vital Signs Pulse (!) 110   Temp 97.8 F (36.6 C) (Oral)   Resp 18   Ht 1.778 m (5\' 10" )   Wt 61.2 kg   SpO2 97%   BMI 19.37 kg/m  Physical Exam Vitals and nursing note reviewed.  Constitutional:      Appearance: Normal appearance. She is well-developed.  HENT:     Head: Atraumatic.     Nose: Nose normal.     Mouth/Throat:     Mouth: Mucous membranes are moist.  Eyes:     General: No scleral icterus.    Conjunctiva/sclera: Conjunctivae normal.  Neck:     Trachea: No tracheal deviation.  Cardiovascular:     Rate and Rhythm: Tachycardia present. Rhythm irregular.     Pulses: Normal pulses.     Heart sounds: Normal heart sounds. No murmur heard.    No friction rub. No gallop.  Pulmonary:     Effort: Pulmonary effort is normal. No respiratory distress.     Breath sounds: Normal breath sounds.  Abdominal:     General: Bowel sounds are normal. There is no distension.     Palpations: Abdomen is soft. There is no mass.     Tenderness: There is no abdominal tenderness. There is no guarding or rebound.     Hernia: No  hernia is present.  Genitourinary:    Comments: No cva tenderness.  Musculoskeletal:        General: No swelling.     Cervical back: Normal range of motion and neck supple. No rigidity. No muscular tenderness.     Right lower leg: No edema.     Left lower leg: No edema.  Skin:    General: Skin is warm and dry.     Findings: No rash.  Neurological:     Mental Status: She is alert.     Comments: Alert, speech normal.   Psychiatric:        Mood and Affect:  Mood normal.     ED Results / Procedures / Treatments   Labs (all labs ordered are listed, but only abnormal results are displayed) Results for orders placed or performed during the hospital encounter of 08/18/21  Comprehensive metabolic panel  Result Value Ref Range   Sodium 140 135 - 145 mmol/L   Potassium 3.5 3.5 - 5.1 mmol/L   Chloride 105 98 - 111 mmol/L   CO2 27 22 - 32 mmol/L   Glucose, Bld 101 (H) 70 - 99 mg/dL   BUN 21 8 - 23 mg/dL   Creatinine, Ser 1.08 (H) 0.44 - 1.00 mg/dL   Calcium 8.7 (L) 8.9 - 10.3 mg/dL   Total Protein 6.5 6.5 - 8.1 g/dL   Albumin 3.4 (L) 3.5 - 5.0 g/dL   AST 18 15 - 41 U/L   ALT 22 0 - 44 U/L   Alkaline Phosphatase 77 38 - 126 U/L   Total Bilirubin 1.0 0.3 - 1.2 mg/dL   GFR, Estimated 53 (L) >60 mL/min   Anion gap 8 5 - 15  CBC  Result Value Ref Range   WBC 10.4 4.0 - 10.5 K/uL   RBC 4.96 3.87 - 5.11 MIL/uL   Hemoglobin 15.0 12.0 - 15.0 g/dL   HCT 47.7 (H) 36.0 - 46.0 %   MCV 96.2 80.0 - 100.0 fL   MCH 30.2 26.0 - 34.0 pg   MCHC 31.4 30.0 - 36.0 g/dL   RDW 21.4 (H) 11.5 - 15.5 %   Platelets 135 (L) 150 - 400 K/uL   nRBC 0.0 0.0 - 0.2 %  Protime-INR  Result Value Ref Range   Prothrombin Time 64.8 (H) 11.4 - 15.2 seconds   INR 7.7 (HH) 0.8 - 1.2  Troponin I (High Sensitivity)  Result Value Ref Range   Troponin I (High Sensitivity) 26 (H) <18 ng/L   ECHOCARDIOGRAM COMPLETE  Result Date: 08/14/2021    ECHOCARDIOGRAM REPORT   Patient Name:   Grier L Labonte Date of Exam:  08/14/2021 Medical Rec #:  [id number]      Height:       70.0 in Accession #:    [id number]     Weight:       135.5 lb Date of Birth:  06-Feb-1941      BSA:          1.769 m Patient Age:    14 years       BP:           95/69 mmHg Patient Gender: F              HR:           113 bpm. 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 CAD, COPD, Arrythmias:Atrial Fibrillation,                 Signs/Symptoms:Murmur; Risk Factors:Hypertension and  Dyslipidemia. Aortic stenosis. Chronic bronchitis. 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 Chronic anticoagulation. S/P                 TAVR (transcatheter aortic valve replacement)- 26 mm Edwards                 Sapien.                 Aortic Valve: 26 mm Sapien prosthetic, stented (TAVR) valve is                 present in the aortic position.  Sonographer:    BERNARD WHITE RCS Referring Phys: [place]  1. Left ventricular ejection fraction, by estimation, is 50 to 55%. The left ventricle has low normal function. The left ventricle has no regional wall motion abnormalities. There is severe left ventricular hypertrophy. Left ventricular diastolic function could not be evaluated.  2. Right ventricular systolic function is normal. The right ventricular size is normal. There is normal pulmonary artery systolic pressure. The estimated right ventricular systolic pressure is 16.1 mmHg.  3. Left atrial size was moderately dilated.  4. The pericardial effusion is posterior to the left ventricle.  5. The mitral valve is grossly normal. Trivial mitral valve regurgitation.  6. The aortic valve has been repaired/replaced.  Aortic valve regurgitation is not visualized. No aortic stenosis is present. There is a 26 mm Sapien prosthetic (TAVR) valve present in the aortic position. Echo findings are consistent with normal structure and function of the aortic valve prosthesis.  7. The inferior vena cava is normal in size with greater than 50% respiratory variability, suggesting right atrial pressure of 3 mmHg. Comparison(s): Changes from prior study are noted. 06/22/2019: LVEF 60-65%, TAVR with normal function. FINDINGS  Left Ventricle: Left ventricular ejection fraction, by estimation, is 50 to 55%. The left ventricle has low normal function. The left ventricle has no regional wall motion abnormalities. The left ventricular internal cavity size was normal in size. There is severe left ventricular hypertrophy. Left ventricular diastolic function could not be evaluated due to atrial fibrillation. Left ventricular diastolic function could not be evaluated. Right Ventricle: The right ventricular size is normal. No increase in right ventricular wall thickness. Right ventricular systolic function is normal. There is normal pulmonary artery systolic pressure. The tricuspid regurgitant velocity is 2.75 m/s, and  with an assumed right atrial pressure of 3 mmHg, the estimated right ventricular systolic pressure is 09.6 mmHg. Left Atrium: Left atrial size was moderately dilated. Right Atrium: Right atrial size was normal in size. Pericardium: Trivial pericardial effusion is present. The pericardial effusion is posterior to the left ventricle. Mitral Valve: The mitral valve is grossly normal. Trivial mitral valve regurgitation. Tricuspid Valve: The tricuspid valve is grossly normal. Tricuspid valve regurgitation is mild. Aortic Valve: The aortic valve has been repaired/replaced. Aortic valve regurgitation is not visualized. No aortic stenosis is present. There is a 26 mm Sapien prosthetic, stented (TAVR) valve present in the aortic position. Echo  findings are consistent with normal structure and function of the aortic valve prosthesis. Pulmonic Valve: The pulmonic valve was grossly normal. Pulmonic valve regurgitation is not visualized. Aorta: The aortic root and ascending aorta are structurally normal, with no evidence of dilitation. Venous: The inferior vena cava is normal in size with greater than 50% respiratory variability, suggesting right atrial pressure of 3  mmHg. IAS/Shunts: No atrial level shunt detected by color flow Doppler. Additional Comments: A venous catheter is visualized.  LEFT VENTRICLE PLAX 2D LVIDd:         3.40 cm LVIDs:         2.50 cm LV PW:         1.30 cm LV IVS:        1.80 cm LVOT diam:     1.50 cm LV SV:         23 LV SV Index:   13 LVOT Area:     1.77 cm  RIGHT VENTRICLE TAPSE (M-mode): 1.4 cm LEFT ATRIUM             Index        RIGHT ATRIUM           Index LA diam:        3.70 cm 2.09 cm/m   RA Area:     20.20 cm LA Vol (A2C):   90.4 ml 51.10 ml/m  RA Volume:   56.80 ml  32.11 ml/m LA Vol (A4C):   59.4 ml 33.58 ml/m LA Biplane Vol: 72.9 ml 41.21 ml/m  AORTIC VALVE LVOT Vmax:   89.67 cm/s LVOT Vmean:  59.300 cm/s LVOT VTI:    0.131 m  AORTA Ao Root diam: 3.10 cm MITRAL VALVE                TRICUSPID VALVE MV Area (PHT): 6.17 cm     TR Peak grad:   30.2 mmHg MV Decel Time: 123 msec     TR Vmax:        275.00 cm/s MV E velocity: 130.00 cm/s                             SHUNTS                             Systemic VTI:  0.13 m                             Systemic Diam: 1.50 cm Lyman Bishop MD Electronically signed by Lyman Bishop MD Signature Date/Time: 08/14/2021/5:03:38 PM    Final    CT Chest W Contrast  Result Date: 08/13/2021 CLINICAL DATA:  Non-small cell lung cancer, recently stopped chemotherapy. * Tracking Code: BO * EXAM: CT CHEST WITH CONTRAST TECHNIQUE: Multidetector CT imaging of the chest was performed during intravenous contrast administration. RADIATION DOSE REDUCTION: This exam was performed according  to the departmental dose-optimization program which includes automated exposure control, adjustment of the mA and/or kV according to patient size and/or use of iterative reconstruction technique. CONTRAST:  25mL OMNIPAQUE IOHEXOL 300 MG/ML  SOLN COMPARISON:  PET 05/08/2021 and CT chest 12/16/2020. FINDINGS: Cardiovascular: Left IJ Port-A-Cath terminates in the SVC. Atherosclerotic calcification of the aorta and coronary arteries. Aortic valve replacement. Heart is enlarged. No pericardial effusion. Mediastinum/Nodes: Low internal jugular and mediastinal lymph nodes are not enlarged by CT size criteria. No hilar or axillary adenopathy. Esophagus is grossly unremarkable. Lungs/Pleura: Biapical pleuroparenchymal scarring. Centrilobular emphysema. Right upper lobectomy. Interval clearing of previously seen patchy/peribronchovascular ground-glass on 05/08/2021. Large right pleural effusion, slightly increased from 05/08/2021. Adjacent compressive atelectasis in the right lung. Clustered peribronchovascular nodularity in the lingula, unchanged and likely postinfectious in etiology. No new pulmonary nodules. No left pleural fluid. Airway  is otherwise unremarkable. Upper Abdomen: Liver margin is irregular. Visualized portions of the liver, adrenal glands, kidneys, spleen, pancreas and stomach are otherwise grossly unremarkable. Musculoskeletal: No worrisome lytic or sclerotic lesions. Right thoracotomy. IMPRESSION: 1. No evidence of recurrent or metastatic disease. 2. Interval clearing of patchy/peribronchovascular ground-glass seen on 05/08/2021, possibly due to therapy-related cessation of treatment. 3. Large right pleural effusion, increased from slightly increased from 05/08/2021. 4. Cirrhosis. 5. Aortic atherosclerosis (ICD10-I70.0). Coronary artery calcification. 6.  Emphysema (ICD10-J43.9). Electronically Signed   By: Lorin Picket M.D.   On: 08/13/2021 08:33   VAS US CAROTID  Result Date: 08/02/2021 Carotid  Arterial Duplex Study Patient Name:  CARALEE MOREA Ivie  Date of Exam:   08/01/2021 Medical Rec #: [id number]       Accession #:    [id number] Date of Birth: 02/24/1941       Patient Gender: F Patient Age:   68 years Exam Location:  [facility] Procedure:      VAS US CAROTID Referring Phys: Shelva Majestic --------------------------------------------------------------------------------  Indications:       Carotid artery disease. Risk Factors:      Hypertension, hyperlipidemia, past history of smoking,                    coronary artery disease, PAD. Other Factors:     Patient denies any cerebrovascular symptoms. Comparison Study:  07/5730-KGUR 427/06 cm/s; LICA 23/76 cm/s Performing Technologist: Wilkie Aye RVT  Examination Guidelines: A complete evaluation includes B-mode imaging, spectral Doppler, color Doppler, and power Doppler as needed of all accessible portions of each vessel. Bilateral testing is considered an integral part of a complete examination. Limited examinations for reoccurring indications may be performed as noted.  Right Carotid Findings: +----------+--------+--------+--------+--------------------------+--------+           PSV cm/sEDV cm/sStenosisPlaque Description        Comments +----------+--------+--------+--------+--------------------------+--------+ CCA Prox  78      11              heterogenous                       +----------+--------+--------+--------+--------------------------+--------+ CCA Mid   55      10      <50%    heterogenous                       +----------+--------+--------+--------+--------------------------+--------+ CCA Distal26      4               heterogenous                       +----------+--------+--------+--------+--------------------------+--------+ ICA Prox  208     55      40-59%  heterogenous and irregular         +----------+--------+--------+--------+--------------------------+--------+ ICA Mid   120     33                                                  +----------+--------+--------+--------+--------------------------+--------+ ICA Distal73      25                                                 +----------+--------+--------+--------+--------------------------+--------+  ECA       79      2               heterogenous                       +----------+--------+--------+--------+--------------------------+--------+ +----------+--------+-------+----------------+-------------------+           PSV cm/sEDV cmsDescribe        Arm Pressure (mmHG) +----------+--------+-------+----------------+-------------------+ [id number]            Multiphasic, [id number]                 +----------+--------+-------+----------------+-------------------+ +---------+--------+--+--------+-+----------------------------+ VertebralPSV cm/s20EDV cm/s4Antegrade and High resistant +---------+--------+--+--------+-+----------------------------+  Left Carotid Findings: +----------+--------+--------+--------+--------------------------+--------+           PSV cm/sEDV cm/sStenosisPlaque Description        Comments +----------+--------+--------+--------+--------------------------+--------+ CCA Prox  32      7               heterogenous                       +----------+--------+--------+--------+--------------------------+--------+ CCA Mid   30      7       <50%    heterogenous                       +----------+--------+--------+--------+--------------------------+--------+ CCA Distal34      11              heterogenous                       +----------+--------+--------+--------+--------------------------+--------+ ICA Prox  75      15      1-39%   heterogenous and irregular         +----------+--------+--------+--------+--------------------------+--------+ ICA Mid   63      20                                                 +----------+--------+--------+--------+--------------------------+--------+ ICA  Distal74      20                                                 +----------+--------+--------+--------+--------------------------+--------+ ECA       240     29      >50%    heterogenous and irregular         +----------+--------+--------+--------+--------------------------+--------+ +----------+--------+--------+----------------+-------------------+           PSV cm/sEDV cm/sDescribe        Arm Pressure (mmHG) +----------+--------+--------+----------------+-------------------+ [id number]             Multiphasic, [id number]                 +----------+--------+--------+----------------+-------------------+ +---------+--------+--+--------+-+----------------------------+ VertebralPSV cm/s26EDV cm/s6Antegrade and High resistant +---------+--------+--+--------+-+----------------------------+   Summary: Right Carotid: Velocities in the right ICA are consistent with a 40-59%                stenosis. Non-hemodynamically significant plaque <50% noted in                the CCA. Left Carotid: Velocities in the left ICA are consistent with a 1-39% stenosis.  Non-hemodynamically significant plaque <50% noted in the CCA. The               ECA appears >50% stenosed. Vertebrals:  Bilateral vertebral arteries demonstrate antegrade flow. Bilateral              vertebral arteries demonstrate high resistant flow. Subclavians: Normal flow hemodynamics were seen in bilateral subclavian              arteries. *See table(s) above for measurements and observations.  Electronically signed by Carlyle Dolly MD on 08/02/2021 at 3:45:09 PM.    Final       EKG EKG Interpretation  Date/Time:  Monday August 18 2021 09:52:56 EDT Ventricular Rate:  118 PR Interval:    QRS Duration: 160 QT Interval:  405 QTC Calculation: 568 R Axis:   -71 Text Interpretation: Atrial fibrillation Left bundle branch block Non-specific ST-t changes Confirmed by Lajean Saver [phone]) on 08/18/2021 10:38:59  AM  Radiology No results found.  Procedures Procedures    Medications Ordered in ED Medications  sodium chloride 0.9 % bolus 1,000 mL (has no administration in time range)  ondansetron (ZOFRAN) injection 4 mg (has no administration in time range)  glucagon (human recombinant) (GLUCAGEN) injection 1 mg (has no administration in time range)    ED Course/ Medical Decision Making/ A&P                           Medical Decision Making Problems Addressed: Anorexia: chronic illness or injury with exacerbation, progression, or side effects of treatment Dehydration: acute illness or injury with systemic symptoms that poses a threat to life or bodily functions Esophageal dysphagia: acute illness or injury with systemic symptoms that poses a threat to life or bodily functions Generalized weakness: acute illness or injury with systemic symptoms Warfarin-induced coagulopathy ([place]): acute illness or injury that poses a threat to life or bodily functions  Amount and/or Complexity of Data Reviewed Independent Historian:     Details: family, hx External Data Reviewed: notes. Labs: ordered. Decision-making details documented in ED Course. ECG/medicine tests: ordered and independent interpretation performed. Decision-making details documented in ED Course. Discussion of management or test interpretation with external provider(s): Gi/hospitalists consulted, discussed pt.   Risk Prescription drug management. Decision regarding hospitalization.   Iv ns. Continuous pulse ox and cardiac monitoring. Labs ordered/sent.   Reviewed nursing notes and prior charts for additional history. External reports reviewed. Pt with EGD two months ago for dysphagia - neg for acute process then. Additional history from: family.   Cardiac monitor: sinus rhythm, rate 110.   Labs reviewed/interpreted by me - INR is high/supratherapeutic.   Vit K iv.   Iv ns bolus. Glucagon iv. Zofran iv.   Pt is able to  tolerate small amounts clear liquid but has persistent fb sensation. Gi consulted - discussed pt - they will see in consult.  Gi requests medical admission, ivf, improve INR, and they will follow, with possible EGD if symptoms persist/fail to improve.   Hospitalists consulted for admission.           Final Clinical Impression(s) / ED Diagnoses Final diagnoses:  None    Rx / DC Orders ED Discharge Orders     None         Lajean Saver, MD 08/18/21 1402

## 2021-08-18 NOTE — Assessment & Plan Note (Signed)
-   Continue Synthroid -Check TSH in the a.m. 

## 2021-08-18 NOTE — Assessment & Plan Note (Addendum)
-   INR 7.7 -Hold warfarin -INR 1.9 in AM 08/19/2021 -GI requested 1 unit FFP which was ordered in the a.m. 08/19/2021 -Warfarin has been restarted, managed by pharm D

## 2021-08-19 ENCOUNTER — Inpatient Hospital Stay (HOSPITAL_COMMUNITY): Payer: Medicare Other | Admitting: Anesthesiology

## 2021-08-19 ENCOUNTER — Encounter (HOSPITAL_COMMUNITY)
Admission: EM | Disposition: A | Discharge status: Hospice | Payer: Self-pay | Source: Home / Self Care | Attending: Family Medicine

## 2021-08-19 ENCOUNTER — Telehealth: Payer: Self-pay

## 2021-08-19 ENCOUNTER — Telehealth (INDEPENDENT_AMBULATORY_CARE_PROVIDER_SITE_OTHER): Payer: Self-pay | Admitting: Gastroenterology

## 2021-08-19 DIAGNOSIS — K2970 Gastritis, unspecified, without bleeding: Secondary | ICD-10-CM

## 2021-08-19 DIAGNOSIS — R1314 Dysphagia, pharyngoesophageal phase: Secondary | ICD-10-CM | POA: Diagnosis present

## 2021-08-19 DIAGNOSIS — R630 Anorexia: Secondary | ICD-10-CM | POA: Diagnosis present

## 2021-08-19 DIAGNOSIS — K7460 Unspecified cirrhosis of liver: Secondary | ICD-10-CM | POA: Diagnosis present

## 2021-08-19 DIAGNOSIS — E038 Other specified hypothyroidism: Secondary | ICD-10-CM

## 2021-08-19 DIAGNOSIS — I129 Hypertensive chronic kidney disease with stage 1 through stage 4 chronic kidney disease, or unspecified chronic kidney disease: Secondary | ICD-10-CM | POA: Diagnosis present

## 2021-08-19 DIAGNOSIS — Z952 Presence of prosthetic heart valve: Secondary | ICD-10-CM

## 2021-08-19 DIAGNOSIS — R1319 Other dysphagia: Secondary | ICD-10-CM | POA: Diagnosis not present

## 2021-08-19 DIAGNOSIS — R000 Tachycardia, unspecified: Secondary | ICD-10-CM | POA: Diagnosis not present

## 2021-08-19 DIAGNOSIS — R1310 Dysphagia, unspecified: Secondary | ICD-10-CM | POA: Diagnosis not present

## 2021-08-19 DIAGNOSIS — Z681 Body mass index (BMI) 19 or less, adult: Secondary | ICD-10-CM | POA: Diagnosis not present

## 2021-08-19 DIAGNOSIS — K222 Esophageal obstruction: Secondary | ICD-10-CM

## 2021-08-19 DIAGNOSIS — Z7952 Long term (current) use of systemic steroids: Secondary | ICD-10-CM | POA: Diagnosis not present

## 2021-08-19 DIAGNOSIS — Z953 Presence of xenogenic heart valve: Secondary | ICD-10-CM | POA: Diagnosis not present

## 2021-08-19 DIAGNOSIS — Y929 Unspecified place or not applicable: Secondary | ICD-10-CM | POA: Diagnosis not present

## 2021-08-19 DIAGNOSIS — Z66 Do not resuscitate: Secondary | ICD-10-CM | POA: Diagnosis present

## 2021-08-19 DIAGNOSIS — T18128A Food in esophagus causing other injury, initial encounter: Secondary | ICD-10-CM | POA: Diagnosis present

## 2021-08-19 DIAGNOSIS — Z791 Long term (current) use of non-steroidal anti-inflammatories (NSAID): Secondary | ICD-10-CM | POA: Diagnosis not present

## 2021-08-19 DIAGNOSIS — E860 Dehydration: Secondary | ICD-10-CM | POA: Diagnosis present

## 2021-08-19 DIAGNOSIS — W44F3XA Food entering into or through a natural orifice, initial encounter: Secondary | ICD-10-CM | POA: Diagnosis present

## 2021-08-19 DIAGNOSIS — J189 Pneumonia, unspecified organism: Secondary | ICD-10-CM | POA: Diagnosis not present

## 2021-08-19 DIAGNOSIS — I10 Essential (primary) hypertension: Secondary | ICD-10-CM | POA: Diagnosis not present

## 2021-08-19 DIAGNOSIS — E162 Hypoglycemia, unspecified: Secondary | ICD-10-CM | POA: Diagnosis not present

## 2021-08-19 DIAGNOSIS — X58XXXA Exposure to other specified factors, initial encounter: Secondary | ICD-10-CM | POA: Diagnosis present

## 2021-08-19 DIAGNOSIS — I2510 Atherosclerotic heart disease of native coronary artery without angina pectoris: Secondary | ICD-10-CM | POA: Diagnosis not present

## 2021-08-19 DIAGNOSIS — N1831 Chronic kidney disease, stage 3a: Secondary | ICD-10-CM | POA: Diagnosis present

## 2021-08-19 DIAGNOSIS — E039 Hypothyroidism, unspecified: Secondary | ICD-10-CM | POA: Diagnosis present

## 2021-08-19 DIAGNOSIS — R791 Abnormal coagulation profile: Secondary | ICD-10-CM | POA: Diagnosis not present

## 2021-08-19 DIAGNOSIS — T18108A Unspecified foreign body in esophagus causing other injury, initial encounter: Secondary | ICD-10-CM

## 2021-08-19 DIAGNOSIS — I739 Peripheral vascular disease, unspecified: Secondary | ICD-10-CM | POA: Diagnosis present

## 2021-08-19 DIAGNOSIS — E782 Mixed hyperlipidemia: Secondary | ICD-10-CM | POA: Diagnosis present

## 2021-08-19 DIAGNOSIS — E161 Other hypoglycemia: Secondary | ICD-10-CM | POA: Diagnosis not present

## 2021-08-19 DIAGNOSIS — Z515 Encounter for palliative care: Secondary | ICD-10-CM | POA: Diagnosis not present

## 2021-08-19 DIAGNOSIS — J440 Chronic obstructive pulmonary disease with acute lower respiratory infection: Secondary | ICD-10-CM | POA: Diagnosis present

## 2021-08-19 DIAGNOSIS — D696 Thrombocytopenia, unspecified: Secondary | ICD-10-CM

## 2021-08-19 DIAGNOSIS — J90 Pleural effusion, not elsewhere classified: Secondary | ICD-10-CM | POA: Diagnosis present

## 2021-08-19 DIAGNOSIS — E440 Moderate protein-calorie malnutrition: Secondary | ICD-10-CM | POA: Diagnosis present

## 2021-08-19 DIAGNOSIS — T18128D Food in esophagus causing other injury, subsequent encounter: Secondary | ICD-10-CM | POA: Diagnosis not present

## 2021-08-19 DIAGNOSIS — I4819 Other persistent atrial fibrillation: Secondary | ICD-10-CM | POA: Diagnosis present

## 2021-08-19 DIAGNOSIS — K224 Dyskinesia of esophagus: Secondary | ICD-10-CM | POA: Diagnosis not present

## 2021-08-19 DIAGNOSIS — Z7901 Long term (current) use of anticoagulants: Secondary | ICD-10-CM | POA: Diagnosis not present

## 2021-08-19 DIAGNOSIS — Z7189 Other specified counseling: Secondary | ICD-10-CM | POA: Diagnosis not present

## 2021-08-19 DIAGNOSIS — Z91040 Latex allergy status: Secondary | ICD-10-CM | POA: Diagnosis not present

## 2021-08-19 DIAGNOSIS — Z7401 Bed confinement status: Secondary | ICD-10-CM | POA: Diagnosis not present

## 2021-08-19 DIAGNOSIS — Z79899 Other long term (current) drug therapy: Secondary | ICD-10-CM | POA: Diagnosis not present

## 2021-08-19 HISTORY — PX: ESOPHAGEAL DILATION: SHX303

## 2021-08-19 HISTORY — PX: ESOPHAGOGASTRODUODENOSCOPY (EGD) WITH PROPOFOL: SHX5813

## 2021-08-19 LAB — CBC WITH DIFFERENTIAL/PLATELET
Abs Immature Granulocytes: 0.06 10*3/uL (ref 0.00–0.07)
Basophils Absolute: 0 10*3/uL (ref 0.0–0.1)
Basophils Relative: 0 %
Eosinophils Absolute: 0 10*3/uL (ref 0.0–0.5)
Eosinophils Relative: 0 %
HCT: 40.5 % (ref 36.0–46.0)
Hemoglobin: 12.6 g/dL (ref 12.0–15.0)
Immature Granulocytes: 1 %
Lymphocytes Relative: 4 %
Lymphs Abs: 0.3 10*3/uL — ABNORMAL LOW (ref 0.7–4.0)
MCH: 30.5 pg (ref 26.0–34.0)
MCHC: 31.1 g/dL (ref 30.0–36.0)
MCV: 98.1 fL (ref 80.0–100.0)
Monocytes Absolute: 0.7 10*3/uL (ref 0.1–1.0)
Monocytes Relative: 8 %
Neutro Abs: 7.5 10*3/uL (ref 1.7–7.7)
Neutrophils Relative %: 87 %
Platelets: 114 10*3/uL — ABNORMAL LOW (ref 150–400)
RBC: 4.13 MIL/uL (ref 3.87–5.11)
RDW: 20.8 % — ABNORMAL HIGH (ref 11.5–15.5)
WBC: 8.7 10*3/uL (ref 4.0–10.5)
nRBC: 0 % (ref 0.0–0.2)

## 2021-08-19 LAB — TSH: TSH: 3.309 u[IU]/mL (ref 0.350–4.500)

## 2021-08-19 LAB — ABO/RH: ABO/RH(D): B POS

## 2021-08-19 LAB — MAGNESIUM: Magnesium: 1.7 mg/dL (ref 1.7–2.4)

## 2021-08-19 LAB — BILIRUBIN, FRACTIONATED(TOT/DIR/INDIR)
Bilirubin, Direct: 0.4 mg/dL — ABNORMAL HIGH (ref 0.0–0.2)
Indirect Bilirubin: 1.4 mg/dL — ABNORMAL HIGH (ref 0.3–0.9)
Total Bilirubin: 1.8 mg/dL — ABNORMAL HIGH (ref 0.3–1.2)

## 2021-08-19 LAB — COMPREHENSIVE METABOLIC PANEL
ALT: 17 U/L (ref 0–44)
AST: 14 U/L — ABNORMAL LOW (ref 15–41)
Albumin: 2.6 g/dL — ABNORMAL LOW (ref 3.5–5.0)
Alkaline Phosphatase: 58 U/L (ref 38–126)
Anion gap: 6 (ref 5–15)
BUN: 21 mg/dL (ref 8–23)
CO2: 22 mmol/L (ref 22–32)
Calcium: 8.1 mg/dL — ABNORMAL LOW (ref 8.9–10.3)
Chloride: 111 mmol/L (ref 98–111)
Creatinine, Ser: 0.87 mg/dL (ref 0.44–1.00)
GFR, Estimated: >60 mL/min (ref >60)
Glucose, Bld: 67 mg/dL — ABNORMAL LOW (ref 70–99)
Potassium: 3.6 mmol/L (ref 3.5–5.1)
Sodium: 139 mmol/L (ref 135–145)
Total Bilirubin: 1.3 mg/dL — ABNORMAL HIGH (ref 0.3–1.2)
Total Protein: 4.9 g/dL — ABNORMAL LOW (ref 6.5–8.1)

## 2021-08-19 LAB — PROTIME-INR
INR: 1.9 — ABNORMAL HIGH (ref 0.8–1.2)
INR: 1.4 — ABNORMAL HIGH (ref 0.8–1.2)
Prothrombin Time: 21.3 seconds — ABNORMAL HIGH (ref 11.4–15.2)
Prothrombin Time: 17.1 seconds — ABNORMAL HIGH (ref 11.4–15.2)

## 2021-08-19 LAB — GLUCOSE, CAPILLARY: Glucose-Capillary: 102 mg/dL — ABNORMAL HIGH (ref 70–99)

## 2021-08-19 SURGERY — ESOPHAGOGASTRODUODENOSCOPY (EGD) WITH PROPOFOL
Anesthesia: General

## 2021-08-19 MED ORDER — WARFARIN SODIUM 2 MG PO TABS
4.0000 mg | ORAL_TABLET | Freq: Once | ORAL | Status: DC
  Filled 2021-08-19: qty 2

## 2021-08-19 MED ORDER — LIDOCAINE VISCOUS HCL 2 % MT SOLN
OROMUCOSAL | Status: AC
  Administered 2021-08-19: 15 mL via OROMUCOSAL
  Filled 2021-08-19: qty 15

## 2021-08-19 MED ORDER — LACTATED RINGERS IV BOLUS
1000.0000 mL | Freq: Once | INTRAVENOUS | Status: AC
  Administered 2021-08-19: 1000 mL via INTRAVENOUS

## 2021-08-19 MED ORDER — LIDOCAINE HCL (CARDIAC) PF 100 MG/5ML IV SOSY
PREFILLED_SYRINGE | INTRAVENOUS | Status: DC | PRN
  Administered 2021-08-19: 60 mg via INTRATRACHEAL

## 2021-08-19 MED ORDER — SODIUM CHLORIDE 0.9% IV SOLUTION: Freq: Once | INTRAVENOUS | Status: AC

## 2021-08-19 MED ORDER — LACTATED RINGERS IV SOLN
INTRAVENOUS | Status: DC
Start: 2021-08-19 — End: 2021-08-19

## 2021-08-19 MED ORDER — PROPOFOL 10 MG/ML IV BOLUS
INTRAVENOUS | Status: DC | PRN
  Administered 2021-08-19: 50 mg via INTRAVENOUS
  Administered 2021-08-19: 20 mg via INTRAVENOUS
  Administered 2021-08-19: 30 mg via INTRAVENOUS

## 2021-08-19 MED ORDER — DEXTROSE-NACL 5-0.9 % IV SOLN: INTRAVENOUS | Status: DC

## 2021-08-19 MED ORDER — PROPOFOL 10 MG/ML IV BOLUS
INTRAVENOUS | Status: AC
  Filled 2021-08-19: qty 20

## 2021-08-19 MED ORDER — SODIUM CHLORIDE 0.9 % IV SOLN: INTRAVENOUS | Status: DC

## 2021-08-19 MED ORDER — PHENYLEPHRINE 80 MCG/ML (10ML) SYRINGE FOR IV PUSH (FOR BLOOD PRESSURE SUPPORT)
PREFILLED_SYRINGE | INTRAVENOUS | Status: AC
  Filled 2021-08-19: qty 10

## 2021-08-19 MED ORDER — METOPROLOL TARTRATE 5 MG/5ML IV SOLN
INTRAVENOUS | Status: AC
  Filled 2021-08-19: qty 5

## 2021-08-19 MED ORDER — METOPROLOL TARTRATE 5 MG/5ML IV SOLN
INTRAVENOUS | Status: DC | PRN
  Administered 2021-08-19: 2 mg via INTRAVENOUS
  Administered 2021-08-19: 3 mg via INTRAVENOUS

## 2021-08-19 MED ORDER — LABETALOL HCL 5 MG/ML IV SOLN
INTRAVENOUS | Status: AC
  Filled 2021-08-19: qty 4

## 2021-08-19 MED ORDER — WARFARIN - PHARMACIST DOSING INPATIENT
Freq: Every day | Status: DC
Start: 2021-08-20 — End: 2021-08-25

## 2021-08-19 MED ORDER — LIDOCAINE VISCOUS HCL 2 % MT SOLN: 15.0000 mL | Freq: Once | OROMUCOSAL | Status: AC

## 2021-08-19 NOTE — Progress Notes (Signed)
Subjective: Feels improved this morning.  States sensation of food being stuck in her esophagus has resolved.  She is tolerating oral secretions without any problems.  She is still NPO.  Denies nausea, vomiting, abdominal pain, abdominal distention, lower extremity edema, BRBPR, melena, mental status changes.  Objective: Vital signs in last 24 hours: Temp:  [97.8 F (36.6 C)-98.4 F (36.9 C)] 98.3 F (36.8 C) (06/20 0945) Pulse Rate:  [89-129] 89 (06/20 0945) Resp:  [13-21] 20 (06/20 0945) BP: (115-179)/(81-99) 160/89 (06/20 0945) SpO2:  [93 %-100 %] 97 % (06/20 0945) FiO2 (%):  [21 %] 21 % (06/19 1916) Last BM Date : 08/17/21 General:   Alert and oriented, pleasant, no acute distress. Head:  Normocephalic and atraumatic. Eyes:  No icterus, sclera clear. Conjuctiva pink.  Abdomen:  Bowel sounds present, soft, non-tender, non-distended. No HSM or hernias noted. No rebound or guarding. No masses appreciated  Msk:  Symmetrical without gross deformities. Normal posture. Extremities:  Without edema. Neurologic:  Alert and  oriented x4;  grossly normal neurologically. Skin:  Warm and dry, intact without significant lesions.  Psych:  Normal mood and affect.  Intake/Output from previous day: 06/19 0701 - 06/20 0700 In: 2350 [P.O.:300; I.V.:1000; IV Piggyback:1050] Out: 300 [Urine:300] Intake/Output this shift: No intake/output data recorded.  Lab Results: Recent Labs    08/18/21 1059 08/19/21 0454  WBC 10.4 8.7  HGB 15.0 12.6  HCT 47.7* 40.5  PLT 135* 114*   BMET Recent Labs    08/18/21 1059 08/19/21 0454  NA 140 139  K 3.5 3.6  CL 105 111  CO2 27 22  GLUCOSE 101* 67*  BUN 21 21  CREATININE 1.08* 0.87  CALCIUM 8.7* 8.1*   LFT Recent Labs    08/18/21 1059 08/19/21 0454  PROT 6.5 4.9*  ALBUMIN 3.4* 2.6*  AST 18 14*  ALT 22 17  ALKPHOS 77 90  BILITOT 1.0 1.3*   PT/INR Recent Labs    08/18/21 1059 08/19/21 0454  LABPROT 64.8* 21.3*  INR 7.7* 1.9*     Assessment: 81 y.o. year old female history of Afib on warfarin, arthritis, CAD s/p TAVR 2016, lung cancer, PVD, HTN, hypothyroid, HLD, GERD, COPD, CKD, stroke in 1982, and chronic dysphagia who comes to the hospital for evaluation of suspected food impaction and also found to have supratherapeutic INR. She was admitted for further management, GI consulted for dysphagia.   Dysphagia/suspected food impaction:  Reports eating chicken wings on 6/18 and felt the food get stuck in the retrosternal area with inability to keep solids or liquids down thereafter which continues into yesterday prompting ER evaluation.  Due to supratherapeutic INR of 7.7, unable to perform EGD yesterday. She received vitamin K yesterday with INR improved to 1.9 this morning. INR needs to be 1.5 or less to proceed with EGD. She is receiving a unit of FFP today. Will plan to recheck INR around noon and if improved to 1.5 or less, we will proceed with EGD today. Clinically, she is feeling improved this morning and no longer has sensation of food bolus in her esophagus. I suspect the food may have passed, but we should still take a look to ensure no underlying pathology causing recent impaction. Notably, her last EGD was in April 2023 with normal appearing esophagus s/p empiric dilation. She reported improvement in dysphagia symptoms thereafter until this episode. Query whether she may have an underlying esophageal motility disorder.   Cirrhosis:  Noted on recent chest CT imaging  on 6/12. LFTs within normal limits. Slight elevation of t bili today at 1.3. Will fractionate. She has mild chronic thrombocytopenia. No evidence of cirrhosis or fatty liver on prior imaging. Recent PET scan in March 2023 without hypermetabolic activity in the liver. Clinically doing well without decompensation. Will defer further workup/management to outpatient setting.   Plan: Keep NPO.  Continue to hold Warfarin.  1 unit FFP this morning.  Repeat INR  at noon.  If INR improved to 1.5 or less, will proceed with EGD with possible esophageal dilation with Dr. Jenetta Downer today.  Continue PPI BID for now.  Fractionate bilirubin.  Further cirrhosis evaluation/management outpatient.    LOS: 0 days    08/19/2021, 10:51 AM   Katelyn Altes, PA-C [facility]

## 2021-08-19 NOTE — Hospital Course (Signed)
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  The patient had a similar presentation was here was admitted to the hospital from 06/20/2021 to 06/27/2021.  She underwent EGD on 06/26/2021 which showed a normal esophagus and normal stomach.  Her esophagus was dilated.  The patient denies any fevers, chills, chest pain, shortness breath, abdominal pain, diarrhea, hematochezia, melena. She suffers from chronic dysphagia and usually has to chew her food completely.  She had numerous episodes of emesis in the emergency department.  She was given some glucagon with some relief of her symptoms.  She was able to tolerate some sips of water currently. In the ED, the patient was afebrile hemodynamically stable with oxygen saturation 90% room air.  WBC 10.4, hemoglobin 15.0, platelets 135,000.  Sodium 139, potassium 3.6, serum creatinine 1.08.  LFTs were unremarkable.  INR was 7.7.  GI was consulted to assist with management. The patient states that she normally checks her INR at home.  She has noticed her INR has been climbing on her last check.  She claims that she did notify her PCP, but continue to take her warfarin.  She has not been on any recent antibiotics nor has been any changes on her medicines.  Patient has not complained of dysphagia since her dilation in April. She continues to lose weight due to lack of appetite. She follows with Dr. Jamey Reas for her right upper lobe lung cancer and was put on megace for appetite stimulation that has been unhelpful. She has been drinking 2-3 ensure a day at home   Recent chest imaging on 6/12 with no evidence of recurrent or metastatic disease, clearing of patchy/peribronchovascular ground-glass , large right pleural effusion, and new cirrhosis not mentioned on  prior imaging.

## 2021-08-19 NOTE — Assessment & Plan Note (Addendum)
Recent CT scan June 2023 suggest cirrhosis of the liver warfarin may also contribute Monitor for signs of bleeding

## 2021-08-19 NOTE — Assessment & Plan Note (Signed)
08/14/2021 echo EF 50-55%, no WMA, normal TAVR function Follow-up cardiology, Dr. Claiborne Billings

## 2021-08-19 NOTE — Assessment & Plan Note (Signed)
Baseline creatinine 0.8-1.1 Stable at this time

## 2021-08-19 NOTE — Progress Notes (Signed)
RN called due to family having a consult for risk of aspiration in patient due to have a discomfort and nausea after trying to eat.  Patient was made n.p.o., D5 NS was started and SLP eval and treat will be done in the morning.  CBG monitoring was also placed.

## 2021-08-19 NOTE — Progress Notes (Signed)
Initial Nutrition Assessment  DOCUMENTATION CODES:   Non-severe (moderate) malnutrition in context of chronic illness  INTERVENTION:  Monitor for diet advancement Once diet resumes, will add nutrition supplements "High Calorie, High Protein Nutrition Therapy" handout provided for patient  NUTRITION DIAGNOSIS:   Moderate Malnutrition related to chronic illness (lung cancer) as evidenced by moderate fat depletion, moderate muscle depletion, mild muscle depletion, percent weight loss.  GOAL:   Patient will meet greater than or equal to 90% of their needs  MONITOR:   Diet advancement, I & O's, Labs, Weight trends  REASON FOR ASSESSMENT:   Malnutrition Screening Tool    ASSESSMENT:   Pt admitted with food impaction of esophagus d/t dysphagia. PMH significant for upper lobe lung cancer s/p R upper lobectomy (08/15/20) afib, AS s/p TAVR, CAD, GERD, HLD, hypothyroidism, HTN and stroke.  Pt familiar to this RD from prior admission in April.  Pt states that she has not had very much PO intake since her last admission and continues with poor appetite. Noted she was started on megace PTA which she endorses taking only for a short while but did not notice improvements.  She recalls intake of mainly snacks such as jello and yogurt, as well as chicken, corn dogs and up to 3 Ensure daily. She denies difficulty swallowing up until this point, since her esophageal dilation in April. She has had limited mobility and utilizes a walker for assistance. She mentions considering hospice care. Spoke with her about resources available for further discussion if desired as well as spiritual care.   Pt is planned for EGD with dilation today and reports feeling hungry as she has not eaten in 2 days.  She is hopeful for diet advancement after procedure.   She suspects additional ongoing wt loss but is uncertain how much. Within the last 3 months (between 03/28-06/19) she has had a 9.2% wt loss which is  significant for time frame.  Medications: synthroid, protonix IV drips: NaCl @20ml /hr, LR @ 42ml/hr  Labs: AST 14, total bilirubin 1.8  NUTRITION - FOCUSED PHYSICAL EXAM:  Flowsheet Row Most Recent Value  Orbital Region Mild depletion  Upper Arm Region Moderate depletion  Thoracic and Lumbar Region Moderate depletion  Buccal Region Moderate depletion  Temple Region Mild depletion  Clavicle Bone Region Moderate depletion  Clavicle and Acromion Bone Region Moderate depletion  Scapular Bone Region Mild depletion  Dorsal Hand Mild depletion  Patellar Region Moderate depletion  Anterior Thigh Region Moderate depletion  Posterior Calf Region Mild depletion  Edema (RD Assessment) None  Hair Reviewed  Eyes Reviewed  Mouth Reviewed  Skin Reviewed  Nails Reviewed       Diet Order:   Diet Order             Diet NPO time specified  Diet effective now                   EDUCATION NEEDS:   Education needs have been addressed  Skin:  Skin Assessment: Reviewed RN Assessment  Last BM:  6/18  Height:   Ht Readings from Last 1 Encounters:  08/18/21 5\' 10"  (1.778 m)    Weight:   Wt Readings from Last 1 Encounters:  08/18/21 61.2 kg    Ideal Body Weight:     BMI:  Body mass index is 19.37 kg/m.  Estimated Nutritional Needs:   Kcal:  1800-2000  Protein:  90-105g  Fluid:  >/=1.8L  Clayborne Dana, RDN, LDN Clinical Nutrition

## 2021-08-19 NOTE — Progress Notes (Signed)
Repeat INR after FFP improved to 1.4. We will proceed with EGD with possible dilation today with Dr. Jenetta Downer.   Aliene Altes, PA-C [facility]

## 2021-08-19 NOTE — Progress Notes (Signed)
SLP Cancellation Note  Patient Details Name: Katelyn [name] MRN: [id number] DOB: [birth year]   Cancelled treatment:       Reason Eval/Treat Not Completed: Other (comment) (Pt NPO pending EGD later today. SLP will check back tomorrow.)  Thank you,  Genene Churn, [place] 08/19/2021, 1:38 PM

## 2021-08-19 NOTE — Telephone Encounter (Signed)
Received call from [facility] to report INR of 7.7 on 6/19.    Patient was admitted to hospital on 6/19 and was administered Vitamin K 5 mg IV and INR today 4/20 was 1.9.  She is presently admitted.

## 2021-08-19 NOTE — Progress Notes (Addendum)
ANTICOAGULATION CONSULT NOTE - Initial Consult  Pharmacy Consult for coumadin Indication: atrial fibrillation  Allergies  Allergen Reactions   Adhesive [Tape] Rash   Latex Rash   Tetanus Toxoids Rash   Wound Dressing Adhesive Rash    Patient Measurements: Height: 5\' 10"  (177.8 cm) Weight: 61.2 kg (135 lb) IBW/kg (Calculated) : 68.5 Heparin Dosing Weight:   Vital Signs: Temp: 98 F (36.7 C) (06/20 1816) Temp Source: Oral (06/20 1816) BP: 159/93 (06/20 1817) Pulse Rate: 99 (06/20 1817)  Labs: Recent Labs    08/18/21 1059 08/18/21 2027 08/19/21 0454 08/19/21 1320  HGB 15.0  --  12.6  --   HCT 47.7*  --  40.5  --   PLT 135*  --  114*  --   LABPROT 64.8*  --  21.3* 17.1*  INR 7.7*  --  1.9* 1.4*  CREATININE 1.08*  --  0.87  --   TROPONINIHS 26* 25*  --   --     Estimated Creatinine Clearance: 51.5 mL/min (by C-G formula based on SCr of 0.87 mg/dL).   Medical History: Past Medical History:  Diagnosis Date   A-fib [facility])    Allergy    rhinitis   Aortic stenosis, severe    a. s/p TAVR 08/2014.   Arthritis    spine and various joints   Cancer of upper lobe of right lung ([place]) 09/04/2020   Carotid artery disease ([place])    Cataract    Cerebrovascular disease 12/05/2018   Childhood asthma    Chronic bronchitis ([place])        Chronic kidney disease 02/17/2021   Claudication ([place])    COPD GOLD II 07/18/2020   Coronary artery disease    a. April 2012 which revealed mid RCA occlusion with collaterals, 50% LAD stenosis, 30% circumflex stenosis, and 40% marginal stenosis   First degree AV block    GERD (gastroesophageal reflux disease)    Heart murmur    Hyperlipidemia    Hypertension    Hypothyroidism 11/14/2020   Left bundle branch block    LUMBAR RADICULOPATHY, RIGHT    PAF (paroxysmal atrial fibrillation) ([place])    Peripheral vascular disease ([place])    a. s/p aortobifem bypass graft surgery and bilateral fundoplasty by [facility].   Renal artery stenosis ([place])    a.  renal artery stenosis (>60% L renal artery) by duplex 04/2020.   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 bilateral subclavian stenosis with antegrade flow by duplex 07/2020    Medications:  Medications Prior to Admission  Medication Sig Dispense Refill Last Dose   acetaminophen (TYLENOL) 500 MG tablet Take 500 mg by mouth every 6 (six) hours as needed for moderate pain.   UNK   albuterol (VENTOLIN HFA) 108 (90 Base) MCG/ACT inhaler Inhale 2 puffs into the lungs 2 (two) times daily as needed for wheezing or shortness of breath. 8 g 2 UNK   amiodarone (PACERONE) 200 MG tablet Take 1 tablet (200 mg total) by mouth 2 (two) times daily. 60 tablet 6 08/16/2021   estradiol (ESTRACE) 2 MG tablet Take 1 tablet (2 mg total) by mouth daily. 90 tablet 0 08/16/2021   furosemide (LASIX) 20  MG tablet Take 1 tablet (20 mg total) by mouth daily. 30 tablet 4 08/16/2021   ipratropium (ATROVENT) 0.06 % nasal spray USE 1 SPRAY(S) IN EACH NOSTRIL EVERY 6 HOURS AS NEEDED FOR RUNNY NOSE (Patient taking differently: Place 1 spray into both nostrils every 6 (six) hours as needed for rhinitis.) 15 mL 2 Past Week   irbesartan (AVAPRO) 300 MG tablet Take 0.5 tablets (150 mg total) by mouth daily. (Patient taking differently: Take 150 mg by mouth in the morning and at bedtime.) 90 tablet 0 08/16/2021   levothyroxine (SYNTHROID) 50 MCG tablet Take 1 tablet (50 mcg total) by mouth daily before breakfast. 30 tablet 3 08/16/2021   meloxicam (MOBIC) 15 MG tablet Take 15 mg by mouth daily as needed for pain.   Past Week   metoCLOPramide (REGLAN) 5 MG tablet Take 2 tablets (10 mg total) by mouth every 8 (eight) hours as needed for nausea, vomiting or refractory  nausea / vomiting. (Patient taking differently: Take 5 mg by mouth every 8 (eight) hours as needed for nausea, vomiting or refractory nausea / vomiting.) 60 tablet 3 08/16/2021   metoprolol succinate (TOPROL-XL) 25 MG 24 hr tablet Take 1 tablet by mouth once daily (Patient taking differently: Take 25 mg by mouth daily.) 90 tablet 0 08/16/2021 at pm   pantoprazole (PROTONIX) 40 MG tablet TAKE 1 TABLET BY MOUTH ONCE DAILY BEFORE SUPPER (Patient taking differently: Take 40 mg by mouth daily.) 90 tablet 3 08/16/2021   predniSONE (DELTASONE) 20 MG tablet Take 1 tablet (20 mg total) by mouth daily with breakfast. (Patient taking differently: Take 10 mg by mouth daily with breakfast.) 30 tablet 0 08/16/2021   prochlorperazine (COMPAZINE) 10 MG tablet Take 1 tablet (10 mg total) by mouth every 6 (six) hours as needed (Nausea or vomiting). 60 tablet 3 Past Week   rosuvastatin (CRESTOR) 20 MG tablet Take 1 tablet (20 mg total) by mouth at bedtime. 90 tablet 0 08/16/2021   warfarin (COUMADIN) 2.5 MG tablet Take 1 tablet (2.5 mg total) by mouth at bedtime.   08/16/2021 at 1900   lidocaine-prilocaine (EMLA) cream Apply a small amount to port a cath site and cover with plastic wrap 1 hour prior to infusion appointments 30 g 3    megestrol (MEGACE) 400 MG/10ML suspension Take 10 mLs (400 mg total) by mouth 2 (two) times daily. (Patient not taking: Reported on 08/18/2021) 480 mL 3 Not Taking   nitroGLYCERIN (NITROSTAT) 0.4 MG SL tablet Place 0.4 mg under the tongue every 5 (five) minutes as needed for chest pain.   UNK   Scheduled:   amiodarone  200 mg Oral BID   irbesartan  150 mg Oral Daily   levothyroxine  50 mcg Oral QAC breakfast   metoprolol succinate  25 mg Oral Daily   pantoprazole (PROTONIX) IV  40 mg Intravenous Q12H   rosuvastatin  20 mg Oral QHS   warfarin  4 mg Oral Once   [START ON 08/20/2021] Warfarin - Pharmacist Dosing Inpatient   Does not apply q1600   Infusions:   Assessment: Pt has been on  coumadin for her AF/CVA prior to admission. Her admission INR was 7.7. She presented with dysphagia. EGD today didn't show any specific cause. She was give 5mg  of vitamin k on 6/19. Coumadin is being restarted today per protocol. INR will likely to continue to trend down so we will give a small load.   INR 1.4  Goal of Therapy:  INR 2-3 Monitor platelets  by anticoagulation protocol: Yes   Plan:  Coumadin 4mg  PO x1 Daily INR  Onnie Boer, PharmD, [place], AAHIVP, CPP Infectious Disease Pharmacist 08/19/2021 7:28 PM   Addendum  Rn said pt failed swallow so couldn't take coumadin tonight.  Onnie Boer, PharmD, BCIDP, AAHIVP, CPP Infectious Disease Pharmacist 08/19/2021 9:44 PM

## 2021-08-19 NOTE — Anesthesia Preprocedure Evaluation (Signed)
Anesthesia Evaluation  Patient identified by MRN, date of birth, ID band Patient awake    Reviewed: Allergy & Precautions, NPO status , Patient's Chart, lab work & pertinent test results, reviewed documented beta blocker date and time   Airway Mallampati: II  TM Distance: >3 FB Neck ROM: Full    Dental  (+) Dental Advisory Given, Missing   Pulmonary shortness of breath and with exertion, asthma , COPD, former smoker,  Right lung cancer, Upper right lobectomy   breath sounds clear to auscultation + decreased breath sounds      Cardiovascular hypertension, Pt. on medications and Pt. on home beta blockers + CAD and + Peripheral Vascular Disease  + dysrhythmias Atrial Fibrillation + Valvular Problems/Murmurs (TAVR)  Rhythm:Regular Rate:Tachycardia + Systolic murmurs    Neuro/Psych  Neuromuscular disease CVA negative psych ROS   GI/Hepatic Neg liver ROS, GERD  Medicated and Poorly Controlled,  Endo/Other  Hypothyroidism   Renal/GU Renal InsufficiencyRenal disease  negative genitourinary   Musculoskeletal  (+) Arthritis , Osteoarthritis,    Abdominal   Peds negative pediatric ROS (+)  Hematology negative hematology ROS (+)   Anesthesia Other Findings   Reproductive/Obstetrics negative OB ROS                             Anesthesia Physical  Anesthesia Plan  ASA: 4  Anesthesia Plan: General   Post-op Pain Management: Minimal or no pain anticipated   Induction: Intravenous  PONV Risk Score and Plan: Propofol infusion  Airway Management Planned: Natural Airway and Nasal Cannula  Additional Equipment:   Intra-op Plan:   Post-operative Plan: Possible Post-op intubation/ventilation  Informed Consent: I have reviewed the patients History and Physical, chart, labs and discussed the procedure including the risks, benefits and alternatives for the proposed anesthesia with the patient or  authorized representative who has indicated his/her understanding and acceptance.    Discussed DNR with patient and Suspend DNR.   Dental advisory given  Plan Discussed with: CRNA and Surgeon  Anesthesia Plan Comments:         Anesthesia Quick Evaluation

## 2021-08-19 NOTE — Assessment & Plan Note (Addendum)
GI consulted Pt was sent for EGD and tolerated well, diet has been resumed, dysphagia 2 Continue protonix

## 2021-08-19 NOTE — Assessment & Plan Note (Addendum)
Related to Keytruda Continue prednisone per Dr. Ellin Saba Follow up with Dr. Ellin Saba

## 2021-08-19 NOTE — Progress Notes (Signed)
This writer was called to patients room, patient reported a bubbling feeling after eating dinner, noted patient lying low in the bed. Patient reported feeling SOB and nauseas. Oxygen saturation 92% at Room air. Family at bedside concerned about aspiration.  Patient given PRN Zofran for nausea. Lung sounds clear, patient pulled up in bed. MD Josephine Cables made aware and reported to on-coming nurse.

## 2021-08-19 NOTE — Progress Notes (Signed)
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  The patient had a similar presentation was here was admitted to the hospital from 06/20/2021 to 06/27/2021.  She underwent EGD on 06/26/2021 which showed a normal esophagus and normal stomach.  Her esophagus was dilated.  The patient denies any fevers, chills, chest pain, shortness breath, abdominal pain, diarrhea, hematochezia, melena. She suffers from chronic dysphagia and usually has to chew her food completely.  She had numerous episodes of emesis in the emergency department.  She was given some glucagon with some relief of her symptoms.  She was able to tolerate some sips of water currently. In the ED, the patient was afebrile hemodynamically stable with oxygen saturation 90% room air.  WBC 10.4, hemoglobin 15.0, platelets 135,000.  Sodium 139, potassium 3.6, serum creatinine 1.08.  LFTs were unremarkable.  INR was 7.7.  GI was consulted to assist with management. The patient states that she normally checks her INR at home.  She has noticed her INR has been climbing on her last check.  She claims that she did notify her PCP, but continue to take her warfarin.  She has not been on any recent antibiotics nor has been any changes on her medicines.  Patient has not complained of dysphagia since her dilation in April. She continues to lose weight due to lack of appetite. She follows with Dr. Jamey Reas for her right upper lobe lung cancer and was put on megace for appetite stimulation that has been unhelpful. She has been drinking 2-3 ensure a day at home   Recent chest imaging on 6/12 with no evidence of recurrent or  metastatic disease, clearing of patchy/peribronchovascular ground-glass , large right pleural effusion, and new cirrhosis not mentioned on prior imaging.       Assessment and Plan: * Food impaction of esophagus GI consulted Plans for EGD once INR is corrected Patient was given vitamin K 5 mg IV x1 on 08/18/2021 Continue protonix  Supratherapeutic INR - INR 7.7 -Hold warfarin -INR 1.9 in AM 08/19/2021 -GI requested 1 unit FFP which was ordered in the a.m. 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   Objective: Vitals:   08/19/21  0604 08/19/21 0840 08/19/21 0911 08/19/21 0945  BP: (!) 179/95 (!) 153/95 (!) 162/92 (!) 160/89  Pulse: 98 91 95 89  Resp: 13  19 20   Temp: 98.4 F (36.9 C)  98.2 F (36.8 C) 98.3 F (36.8 C)  TempSrc: Oral  Oral Oral  SpO2: 97%  97% 97%  Weight:      Height:        Intake/Output Summary (Last 24 hours) at 08/19/2021 0953 Last data filed at 08/19/2021 0400 Gross per 24 hour  Intake 2350 ml  Output 300 ml  Net 2050 ml   Weight change:  Exam:  General:  Pt is alert, follows commands appropriately, not in acute distress HEENT: No icterus, No thrush, No neck mass, [place]/AT Cardiovascular: RRR, S1/S2, no rubs, no gallops Respiratory: bibasilar crackles R>L Abdomen: Soft/+BS, non tender, non distended, no guarding Extremities: No edema, No lymphangitis, No petechiae, No rashes, no synovitis   Data Reviewed: I have personally reviewed following labs and imaging studies Basic Metabolic Panel: Recent Labs  Lab 08/18/21 1059 08/19/21 0454  NA 140 139  K 3.5 3.6  CL 105 111  CO2 27 22  GLUCOSE 101* 67*  BUN 21 21  CREATININE 1.08* 0.87  CALCIUM 8.7* 8.1*  MG  --  1.7   Liver Function Tests: Recent Labs  Lab 08/18/21 1059 08/19/21 0454  AST 18 14*  ALT 22 17  ALKPHOS 77 58  BILITOT 1.0 1.3*  PROT 6.5 4.9*  ALBUMIN 3.4* 2.6*   No results for input(s): "LIPASE", "AMYLASE" in the last 168 hours. No results for input(s): "AMMONIA" in the last 168 hours. Coagulation Profile: Recent Labs  Lab 08/18/21 1059 08/19/21 0454  INR 7.7* 1.9*   CBC: Recent Labs  Lab 08/18/21 1059 08/19/21 0454  WBC 10.4 8.7  NEUTROABS  --  7.5  HGB 15.0 12.6  HCT 47.7* 40.5  MCV 96.2 98.1  PLT 135* 114*   Cardiac Enzymes: No results for input(s): "CKTOTAL", "CKMB", "CKMBINDEX", "TROPONINI" in the last 168 hours. BNP: Invalid input(s): "POCBNP" CBG: No results for input(s): "GLUCAP" in the last 168 hours. HbA1C: No results for input(s): "HGBA1C" in the last 72 hours. Urine  analysis:    Component Value Date/Time   COLORURINE YELLOW 08/18/2021 1405   APPEARANCEUR HAZY (A) 08/18/2021 1405   LABSPEC 1.018 08/18/2021 1405   PHURINE 5.0 08/18/2021 1405   GLUCOSEU NEGATIVE 08/18/2021 1405   HGBUR MODERATE (A) 08/18/2021 1405   BILIRUBINUR NEGATIVE 08/18/2021 1405   KETONESUR 5 (A) 08/18/2021 1405   PROTEINUR 30 (A) 08/18/2021 1405   UROBILINOGEN 0.2 08/29/2014 1151   NITRITE NEGATIVE 08/18/2021 1405   LEUKOCYTESUR NEGATIVE 08/18/2021 1405   Sepsis Labs: @LABRCNTIP (procalcitonin:4,lacticidven:4) )No results found for this or any previous visit (from the past 240 hour(s)).   Scheduled Meds:  amiodarone  200 mg Oral BID   irbesartan  150 mg Oral Daily   levothyroxine  50 mcg Oral QAC breakfast   metoprolol succinate  25 mg Oral Daily   pantoprazole (PROTONIX) IV  40 mg Intravenous Q12H   rosuvastatin  20 mg Oral QHS   Continuous Infusions:  Procedures/Studies: ECHOCARDIOGRAM COMPLETE  Result Date: 08/14/2021    ECHOCARDIOGRAM REPORT   Patient Name:   TAMBER BURTCH Gerdeman Date of Exam: 08/14/2021 Medical Rec #:  [id number]  Height:       70.0 in Accession #:    [id number]     Weight:       135.5 lb Date of Birth:  23-Dec-1940      BSA:          1.769 m Patient Age:    50 years       BP:           95/69 mmHg Patient Gender: F              HR:           113 bpm. 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 CAD, COPD, Arrythmias:Atrial Fibrillation,                 Signs/Symptoms:Murmur; Risk Factors:Hypertension and                 Dyslipidemia. Aortic stenosis. Chronic bronchitis. Cancer of                 upper lobe of right lung ([place]). Chronic  anticoagulation. S/P                 TAVR (transcatheter aortic valve replacement)- 26 mm Edwards                 Sapien.                 Aortic Valve: 26 mm Sapien prosthetic, stented (TAVR) valve is                 present in the aortic position.  Sonographer:    BERNARD WHITE RCS Referring Phys: [place]  1. Left ventricular ejection fraction, by estimation, is 50 to 55%. The left ventricle has low normal function. The left ventricle has no regional wall motion abnormalities. There is severe left ventricular hypertrophy. Left ventricular diastolic function could not be evaluated.  2. Right ventricular systolic function is normal. The right ventricular size is normal. There is normal pulmonary artery systolic pressure. The estimated right ventricular systolic pressure is 32.6 mmHg.  3. Left atrial size was moderately dilated.  4. The pericardial effusion is posterior to the left ventricle.  5. The mitral valve is grossly normal. Trivial mitral valve regurgitation.  6. The aortic valve has been repaired/replaced. Aortic valve regurgitation is not visualized. No aortic stenosis is present. There is a 26 mm Sapien prosthetic (TAVR) valve present in the aortic position. Echo findings are consistent with normal structure and function of the aortic valve prosthesis.  7. The inferior vena cava is normal in size with greater than 50% respiratory variability, suggesting right atrial pressure of 3 mmHg. Comparison(s): Changes from prior study are noted. 06/22/2019: LVEF 60-65%, TAVR with normal function. FINDINGS  Left Ventricle: Left ventricular ejection fraction, by estimation, is 50 to 55%. The left ventricle has low normal function. The left ventricle has no regional wall motion abnormalities. The left  ventricular internal cavity size was normal in size. There is severe left ventricular hypertrophy. Left ventricular diastolic function could not be evaluated due to atrial fibrillation. Left  ventricular diastolic function could not be evaluated. Right Ventricle: The right ventricular size is normal. No increase in right ventricular wall thickness. Right ventricular systolic function is normal. There is normal pulmonary artery systolic pressure. The tricuspid regurgitant velocity is 2.75 m/s, and  with an assumed right atrial pressure of 3 mmHg, the estimated right ventricular systolic pressure is 50.0 mmHg. Left Atrium: Left atrial size was moderately dilated. Right Atrium: Right atrial size was normal in size. Pericardium: Trivial pericardial effusion is present. The pericardial effusion is posterior to the left ventricle. Mitral Valve: The mitral valve is grossly normal. Trivial mitral valve regurgitation. Tricuspid Valve: The tricuspid valve is grossly normal. Tricuspid valve regurgitation is mild. Aortic Valve: The aortic valve has been repaired/replaced. Aortic valve regurgitation is not visualized. No aortic stenosis is present. There is a 26 mm Sapien prosthetic, stented (TAVR) valve present in the aortic position. Echo findings are consistent with normal structure and function of the aortic valve prosthesis. Pulmonic Valve: The pulmonic valve was grossly normal. Pulmonic valve regurgitation is not visualized. Aorta: The aortic root and ascending aorta are structurally normal, with no evidence of dilitation. Venous: The inferior vena cava is normal in size with greater than 50% respiratory variability, suggesting right atrial pressure of 3 mmHg. IAS/Shunts: No atrial level shunt detected by color flow Doppler. Additional Comments: A venous catheter is visualized.  LEFT VENTRICLE PLAX 2D LVIDd:         3.40 cm LVIDs:         2.50 cm LV PW:         1.30 cm LV IVS:        1.80 cm LVOT diam:     1.50 cm LV SV:         23 LV SV Index:   13 LVOT Area:     1.77 cm  RIGHT VENTRICLE TAPSE (M-mode): 1.4 cm LEFT ATRIUM             Index        RIGHT ATRIUM           Index LA diam:        3.70 cm 2.09 cm/m    RA Area:     20.20 cm LA Vol (A2C):   90.4 ml 51.10 ml/m  RA Volume:   56.80 ml  32.11 ml/m LA Vol (A4C):   59.4 ml 33.58 ml/m LA Biplane Vol: 72.9 ml 41.21 ml/m  AORTIC VALVE LVOT Vmax:   89.67 cm/s LVOT Vmean:  59.300 cm/s LVOT VTI:    0.131 m  AORTA Ao Root diam: 3.10 cm MITRAL VALVE                TRICUSPID VALVE MV Area (PHT): 6.17 cm     TR Peak grad:   30.2 mmHg MV Decel Time: 123 msec     TR Vmax:        275.00 cm/s MV E velocity: 130.00 cm/s                             SHUNTS                             Systemic VTI:  0.13 m  Systemic Diam: 1.50 cm Lyman Bishop MD Electronically signed by Lyman Bishop MD Signature Date/Time: 08/14/2021/5:03:38 PM    Final    CT Chest W Contrast  Result Date: 08/13/2021 CLINICAL DATA:  Non-small cell lung cancer, recently stopped chemotherapy. * Tracking Code: BO * EXAM: CT CHEST WITH CONTRAST TECHNIQUE: Multidetector CT imaging of the chest was performed during intravenous contrast administration. RADIATION DOSE REDUCTION: This exam was performed according to the departmental dose-optimization program which includes automated exposure control, adjustment of the mA and/or kV according to patient size and/or use of iterative reconstruction technique. CONTRAST:  58mL OMNIPAQUE IOHEXOL 300 MG/ML  SOLN COMPARISON:  PET 05/08/2021 and CT chest 12/16/2020. FINDINGS: Cardiovascular: Left IJ Port-A-Cath terminates in the SVC. Atherosclerotic calcification of the aorta and coronary arteries. Aortic valve replacement. Heart is enlarged. No pericardial effusion. Mediastinum/Nodes: Low internal jugular and mediastinal lymph nodes are not enlarged by CT size criteria. No hilar or axillary adenopathy. Esophagus is grossly unremarkable. Lungs/Pleura: Biapical pleuroparenchymal scarring. Centrilobular emphysema. Right upper lobectomy. Interval clearing of previously seen patchy/peribronchovascular ground-glass on 05/08/2021. Large right pleural  effusion, slightly increased from 05/08/2021. Adjacent compressive atelectasis in the right lung. Clustered peribronchovascular nodularity in the lingula, unchanged and likely postinfectious in etiology. No new pulmonary nodules. No left pleural fluid. Airway is otherwise unremarkable. Upper Abdomen: Liver margin is irregular. Visualized portions of the liver, adrenal glands, kidneys, spleen, pancreas and stomach are otherwise grossly unremarkable. Musculoskeletal: No worrisome lytic or sclerotic lesions. Right thoracotomy. IMPRESSION: 1. No evidence of recurrent or metastatic disease. 2. Interval clearing of patchy/peribronchovascular ground-glass seen on 05/08/2021, possibly due to therapy-related cessation of treatment. 3. Large right pleural effusion, increased from slightly increased from 05/08/2021. 4. Cirrhosis. 5. Aortic atherosclerosis (ICD10-I70.0). Coronary artery calcification. 6.  Emphysema (ICD10-J43.9). Electronically Signed   By: Lorin Picket M.D.   On: 08/13/2021 08:33   VAS US CAROTID  Result Date: 08/02/2021 Carotid Arterial Duplex Study Patient Name:  KENNADIE BRENNER Romney  Date of Exam:   08/01/2021 Medical Rec #: [id number]       Accession #:    [id number] Date of Birth: August 09, 1940       Patient Gender: F Patient Age:   37 years Exam Location:  [facility] Procedure:      VAS US CAROTID Referring Phys: Shelva Majestic --------------------------------------------------------------------------------  Indications:       Carotid artery disease. Risk Factors:      Hypertension, hyperlipidemia, past history of smoking,                    coronary artery disease, PAD. Other Factors:     Patient denies any cerebrovascular symptoms. Comparison Study:  04/2977-GXQJ 194/17 cm/s; LICA 40/81 cm/s Performing Technologist: Wilkie Aye RVT  Examination Guidelines: A complete evaluation includes B-mode imaging, spectral Doppler, color Doppler, and power Doppler as needed of all accessible portions of each vessel.  Bilateral testing is considered an integral part of a complete examination. Limited examinations for reoccurring indications may be performed as noted.  Right Carotid Findings: +----------+--------+--------+--------+--------------------------+--------+           PSV cm/sEDV cm/sStenosisPlaque Description        Comments +----------+--------+--------+--------+--------------------------+--------+ CCA Prox  78      11              heterogenous                       +----------+--------+--------+--------+--------------------------+--------+ CCA Mid   55  10      <50%    heterogenous                       +----------+--------+--------+--------+--------------------------+--------+ CCA Distal26      4               heterogenous                       +----------+--------+--------+--------+--------------------------+--------+ ICA Prox  208     55      40-59%  heterogenous and irregular         +----------+--------+--------+--------+--------------------------+--------+ ICA Mid   120     33                                                 +----------+--------+--------+--------+--------------------------+--------+ ICA Distal73      25                                                 +----------+--------+--------+--------+--------------------------+--------+ ECA       79      2               heterogenous                       +----------+--------+--------+--------+--------------------------+--------+ +----------+--------+-------+----------------+-------------------+           PSV cm/sEDV cmsDescribe        Arm Pressure (mmHG) +----------+--------+-------+----------------+-------------------+ [id number]            Multiphasic, [id number]                 +----------+--------+-------+----------------+-------------------+ +---------+--------+--+--------+-+----------------------------+ VertebralPSV cm/s20EDV cm/s4Antegrade and High resistant  +---------+--------+--+--------+-+----------------------------+  Left Carotid Findings: +----------+--------+--------+--------+--------------------------+--------+           PSV cm/sEDV cm/sStenosisPlaque Description        Comments +----------+--------+--------+--------+--------------------------+--------+ CCA Prox  32      7               heterogenous                       +----------+--------+--------+--------+--------------------------+--------+ CCA Mid   30      7       <50%    heterogenous                       +----------+--------+--------+--------+--------------------------+--------+ CCA Distal34      11              heterogenous                       +----------+--------+--------+--------+--------------------------+--------+ ICA Prox  75      15      1-39%   heterogenous and irregular         +----------+--------+--------+--------+--------------------------+--------+ ICA Mid   63      20                                                 +----------+--------+--------+--------+--------------------------+--------+ ICA Distal74  20                                                 +----------+--------+--------+--------+--------------------------+--------+ ECA       240     29      >50%    heterogenous and irregular         +----------+--------+--------+--------+--------------------------+--------+ +----------+--------+--------+----------------+-------------------+           PSV cm/sEDV cm/sDescribe        Arm Pressure (mmHG) +----------+--------+--------+----------------+-------------------+ [id number]             Multiphasic, [id number]                 +----------+--------+--------+----------------+-------------------+ +---------+--------+--+--------+-+----------------------------+ VertebralPSV cm/s26EDV cm/s6Antegrade and High resistant +---------+--------+--+--------+-+----------------------------+   Summary: Right Carotid: Velocities  in the right ICA are consistent with a 40-59%                stenosis. Non-hemodynamically significant plaque <50% noted in                the CCA. Left Carotid: Velocities in the left ICA are consistent with a 1-39% stenosis.               Non-hemodynamically significant plaque <50% noted in the CCA. The               ECA appears >50% stenosed. Vertebrals:  Bilateral vertebral arteries demonstrate antegrade flow. Bilateral              vertebral arteries demonstrate high resistant flow. Subclavians: Normal flow hemodynamics were seen in bilateral subclavian              arteries. *See table(s) above for measurements and observations.  Electronically signed by Carlyle Dolly MD on 08/02/2021 at 3:45:09 PM.    Final     Orson Eva, DO  [facility]  If 7PM-7AM, please contact night-coverage [email or web address] Password [email or web address] 08/19/2021, 9:53 AM   LOS: 0 days

## 2021-08-19 NOTE — Telephone Encounter (Signed)
Routing to [place] to schedule follow-up.   Katelyn [name], please see message from Dr. Jenetta Downer below.

## 2021-08-19 NOTE — Transfer of Care (Signed)
Immediate Anesthesia Transfer of Care Note  Patient: Katelyn [name]  Procedure(s) Performed: ESOPHAGOGASTRODUODENOSCOPY (EGD) WITH PROPOFOL ESOPHAGEAL DILATION  Patient Location: PACU  Anesthesia Type:General  Level of Consciousness: awake, alert , oriented, sedated, drowsy and patient cooperative  Airway & Oxygen Therapy: Patient Spontanous Breathing and Patient connected to nasal cannula oxygen  Post-op Assessment: Report given to RN and Post -op Vital signs reviewed and stable  Post vital signs: Reviewed and stable  Last Vitals:  Vitals Value Taken Time  BP 99/49 08/19/21 1742  Temp 36.5 C 08/19/21 1742  Pulse 83 08/19/21 1742  Resp 22 08/19/21 1742  SpO2 100 % 08/19/21 1742    Last Pain:  Vitals:   08/19/21 1742  TempSrc:   PainSc: 0-No pain         Complications: No notable events documented.

## 2021-08-19 NOTE — Progress Notes (Signed)
Patient completed plasma transfusion with no complaints, noted patients BP 184/94 after transfusion. MD Tat made aware.

## 2021-08-19 NOTE — Telephone Encounter (Signed)
Hi Mitzie,  Can you please schedule a follow up appointment for this patient in next 3-4 weeks at [facility]? Dx: dysphagia.  Thanks,  Maylon Peppers, MD Gastroenterology and Hepatology [facility]

## 2021-08-19 NOTE — Telephone Encounter (Signed)
Lpm informing her that she does not need to report INR values while admitted to the hospital.  I told her to call us once discharged and we will f/u then.

## 2021-08-19 NOTE — Assessment & Plan Note (Addendum)
Held warfarin temporarily but now has been restarted Restart warfarin per GI Continue amiodarone and metoprolol XL

## 2021-08-19 NOTE — Op Note (Signed)
[facility] Patient Name: Katelyn [name] Procedure Date: 08/19/2021 4:59 PM MRN: [id number] Date of Birth: [birth year] Attending MD: Maylon Peppers ,  CSN: [id number] Age: 81 Admit Type: Inpatient Procedure:                Upper GI endoscopy Indications:              Dysphagia, Foreign body in the esophagus Providers:                Maylon Peppers, Lurline Del, RN, Casimer Bilis, Technician Referring MD:              Medicines:                Monitored Anesthesia Care Complications:            No immediate complications. Estimated Blood Loss:     Estimated blood loss: none. Procedure:                Pre-Anesthesia Assessment:                           - Prior to the procedure, a History and Physical                            was performed, and patient medications, allergies                            and sensitivities were reviewed. The patient's                            tolerance of previous anesthesia was reviewed.                           - The risks and benefits of the procedure and the                            sedation options and risks were discussed with the                            patient. All questions were answered and informed                            consent was obtained.                           - ASA Grade Assessment: III - A patient with severe                            systemic disease.                           After obtaining informed consent, the endoscope was                            passed under direct vision. Throughout the  procedure, the patient's blood pressure, pulse, and                            oxygen saturations were monitored continuously. The                            GIF-H190 ([id number]) scope was introduced through the                            mouth, and advanced to the second part of duodenum.                            The upper GI endoscopy was accomplished without                             difficulty. The patient tolerated the procedure                            well. Scope In: 5:24:31 PM Scope Out: 5:30:18 PM Total Procedure Duration: 0 hours 5 minutes 47 seconds  Findings:      No endoscopic abnormality was evident in the esophagus to explain the       patient's complaint of dysphagia. It was decided, however, to proceed       with dilation of the entire esophagus. A guidewire was placed and the       scope was withdrawn. Dilation was performed with a Savary dilator with       no resistance at 18 mm. Mild mucosal disruption of the UES was seen upon       reinspection.      Segmental mild inflammation characterized by erythema was found in the       gastric body and in the gastric antrum.      The examined duodenum was normal. Impression:               - No endoscopic esophageal abnormality to explain                            patient's dysphagia. Esophagus dilated. Dilated.                           - Gastritis.                           - Normal examined duodenum.                           - No specimens collected. Moderate Sedation:      Per Anesthesia Care Recommendation:           - Resume previous diet - need to chew food                            thoroughly and cut it in small pieces.                           - Continue present medications.                           -  Return patient to hospital ward for ongoing care.                           - Can restart Coumadin tonight.                           - Follow up in GI clinic - may consider outpatient                            manometry for dysphagia evaluation. Procedure Code(s):        --- Professional ---                           [phone], Esophagogastroduodenoscopy, flexible,                            transoral; with insertion of guide wire followed by                            passage of dilator(s) through esophagus over guide                            wire Diagnosis  Code(s):        --- Professional ---                           R13.10, Dysphagia, unspecified                           K29.70, Gastritis, unspecified, without bleeding                           T18.108A, Unspecified foreign body in esophagus                            causing other injury, initial encounter CPT copyright 2019 [employer]. All rights reserved. The codes documented in this report are preliminary and upon coder review may  be revised to meet current compliance requirements. Maylon Peppers, MD Maylon Peppers,  08/19/2021 5:38:33 PM This report has been signed electronically. Number of Addenda: 0

## 2021-08-19 NOTE — Anesthesia Postprocedure Evaluation (Signed)
Anesthesia Post Note  Patient: YURIDIANA FORMANEK  Procedure(s) Performed: ESOPHAGOGASTRODUODENOSCOPY (EGD) WITH PROPOFOL ESOPHAGEAL DILATION  Patient location during evaluation: PACU Anesthesia Type: General Level of consciousness: awake and alert and oriented Pain management: pain level controlled Vital Signs Assessment: post-procedure vital signs reviewed and stable Respiratory status: spontaneous breathing, nonlabored ventilation, respiratory function stable and patient connected to nasal cannula oxygen Cardiovascular status: blood pressure returned to baseline and stable Postop Assessment: no apparent nausea or vomiting Anesthetic complications: no   No notable events documented.   Last Vitals:  Vitals:   08/19/21 1745 08/19/21 1754  BP:  (!) 92/51  Pulse: 99   Resp:  17  Temp:    SpO2:  99%    Last Pain:  Vitals:   08/19/21 1742  TempSrc:   PainSc: 0-No pain                 Asar Evilsizer C Wilmarie Sparlin

## 2021-08-19 NOTE — Brief Op Note (Signed)
08/18/2021 - 08/19/2021  5:35 PM  PATIENT:  Katelyn [name]  81 y.o. female  PRE-OPERATIVE DIAGNOSIS:  Esophageal dysphagia, recent food impaction  POST-OPERATIVE DIAGNOSIS:  hiatal, hernia;  PROCEDURE:  Procedure(s): ESOPHAGOGASTRODUODENOSCOPY (EGD) WITH PROPOFOL (N/A) ESOPHAGEAL DILATION (N/A)  SURGEON:  Surgeon(s) and Role:    * Harvel Quale, MD - Primary  Patient underwent EGD under propofol sedation.  Tolerated the procedure adequately.  No endoscopic abnormality was evident in the esophagus to explain the patient's complaint of dysphagia.  It was decided, however, to proceed with dilation of the entire esophagus.  A guidewire was placed and the scope was withdrawn.  Dilation was performed with a Savary dilator with no resistance at 18 mm.  Mild mucosal disruption of the UES was seen upon reinspection. Segmental mild inflammation characterized by erythema was found in the gastric body and in the gastric antrum.  The examined duodenum was normal.   RECOMMENDATIONS - Resume previous diet - need to chew food thoroughly and cut it in small pieces. - Continue present medications.  - Return patient to hospital ward for ongoing care.  - Can restart Coumadin tonight. - Follow up in GI clinic - may consider outpatient manometry for dysphagia evaluation. - GI service will sign-off, please call us back if you have any more questions.  Maylon Peppers, MD Gastroenterology and Hepatology [facility]

## 2021-08-19 NOTE — Progress Notes (Signed)
  Transition of Care [facility]) Screening Note   Patient Details  Name: Katelyn [name] Date of Birth: 09-13-1940   Transition of Care [facility]) CM/SW Contact:    Ihor Gully, LCSW Phone Number: 08/19/2021, 1:38 PM    Transition of Care Department [facility]) has reviewed patient and no TOC needs have been identified at this time. We will continue to monitor patient advancement through interdisciplinary progression rounds. If new patient transition needs arise, please place a TOC consult.

## 2021-08-19 NOTE — Progress Notes (Signed)
Notified Dr. Josephine Cables r/t pt failed bedside swallow screen, and pt unable to swallow scheduled po meds.

## 2021-08-20 ENCOUNTER — Encounter: Payer: Self-pay | Admitting: Internal Medicine

## 2021-08-20 ENCOUNTER — Encounter (HOSPITAL_COMMUNITY): Payer: Self-pay | Admitting: Internal Medicine

## 2021-08-20 DIAGNOSIS — T18128D Food in esophagus causing other injury, subsequent encounter: Secondary | ICD-10-CM | POA: Diagnosis not present

## 2021-08-20 DIAGNOSIS — R630 Anorexia: Secondary | ICD-10-CM | POA: Diagnosis not present

## 2021-08-20 DIAGNOSIS — T18128A Food in esophagus causing other injury, initial encounter: Secondary | ICD-10-CM

## 2021-08-20 DIAGNOSIS — Z515 Encounter for palliative care: Secondary | ICD-10-CM

## 2021-08-20 DIAGNOSIS — Z7189 Other specified counseling: Secondary | ICD-10-CM | POA: Diagnosis not present

## 2021-08-20 DIAGNOSIS — J189 Pneumonia, unspecified organism: Secondary | ICD-10-CM

## 2021-08-20 DIAGNOSIS — R1319 Other dysphagia: Secondary | ICD-10-CM | POA: Diagnosis not present

## 2021-08-20 DIAGNOSIS — N1831 Chronic kidney disease, stage 3a: Secondary | ICD-10-CM | POA: Diagnosis not present

## 2021-08-20 DIAGNOSIS — K222 Esophageal obstruction: Secondary | ICD-10-CM

## 2021-08-20 LAB — BPAM FFP
Blood Product Expiration Date: 202306252359
ISSUE DATE / TIME: 202306200918
Unit Type and Rh: 7300

## 2021-08-20 LAB — CBC
HCT: 41.3 % (ref 36.0–46.0)
Hemoglobin: 12.6 g/dL (ref 12.0–15.0)
MCH: 29.9 pg (ref 26.0–34.0)
MCHC: 30.5 g/dL (ref 30.0–36.0)
MCV: 97.9 fL (ref 80.0–100.0)
Platelets: 107 10*3/uL — ABNORMAL LOW (ref 150–400)
RBC: 4.22 MIL/uL (ref 3.87–5.11)
RDW: 20.4 % — ABNORMAL HIGH (ref 11.5–15.5)
WBC: 8.4 10*3/uL (ref 4.0–10.5)
nRBC: 0 % (ref 0.0–0.2)

## 2021-08-20 LAB — BASIC METABOLIC PANEL
Anion gap: 7 (ref 5–15)
BUN: 19 mg/dL (ref 8–23)
CO2: 22 mmol/L (ref 22–32)
Calcium: 8.2 mg/dL — ABNORMAL LOW (ref 8.9–10.3)
Chloride: 109 mmol/L (ref 98–111)
Creatinine, Ser: 0.8 mg/dL (ref 0.44–1.00)
GFR, Estimated: >60 mL/min (ref >60)
Glucose, Bld: 96 mg/dL (ref 70–99)
Potassium: 3.6 mmol/L (ref 3.5–5.1)
Sodium: 138 mmol/L (ref 135–145)

## 2021-08-20 LAB — PREPARE FRESH FROZEN PLASMA: Unit division: 0

## 2021-08-20 LAB — PROTIME-INR
INR: 2.4 — ABNORMAL HIGH (ref 0.8–1.2)
Prothrombin Time: 25.8 seconds — ABNORMAL HIGH (ref 11.4–15.2)

## 2021-08-20 LAB — GLUCOSE, CAPILLARY
Glucose-Capillary: 108 mg/dL — ABNORMAL HIGH (ref 70–99)
Glucose-Capillary: 111 mg/dL — ABNORMAL HIGH (ref 70–99)
Glucose-Capillary: 139 mg/dL — ABNORMAL HIGH (ref 70–99)
Glucose-Capillary: 98 mg/dL (ref 70–99)
Glucose-Capillary: 99 mg/dL (ref 70–99)

## 2021-08-20 LAB — MAGNESIUM: Magnesium: 1.7 mg/dL (ref 1.7–2.4)

## 2021-08-20 MED ORDER — METOPROLOL TARTRATE 5 MG/5ML IV SOLN
5.0000 mg | Freq: Once | INTRAVENOUS | Status: AC
  Administered 2021-08-20: 5 mg via INTRAVENOUS
  Filled 2021-08-20: qty 5

## 2021-08-20 MED ORDER — WARFARIN SODIUM 1 MG PO TABS
1.0000 mg | ORAL_TABLET | Freq: Once | ORAL | Status: AC
  Administered 2021-08-20: 1 mg via ORAL
  Filled 2021-08-20: qty 1

## 2021-08-20 MED ORDER — METOPROLOL TARTRATE 5 MG/5ML IV SOLN
5.0000 mg | Freq: Once | INTRAVENOUS | Status: DC
Start: 2021-08-20 — End: 2021-08-21

## 2021-08-20 NOTE — Progress Notes (Signed)
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  The patient had a similar presentation was here was admitted to the hospital from 06/20/2021 to 06/27/2021.  She underwent EGD on 06/26/2021 which showed a normal esophagus and normal stomach.  Her esophagus was dilated.  The patient denies any fevers, chills, chest pain, shortness breath, abdominal pain, diarrhea, hematochezia, melena. She suffers from chronic dysphagia and usually has to chew her food completely.  She had numerous episodes of emesis in the emergency department.  She was given some glucagon with some relief of her symptoms.  She was able to tolerate some sips of water currently. In the ED, the patient was afebrile hemodynamically stable with oxygen saturation 90% room air.  WBC 10.4, hemoglobin 15.0, platelets 135,000.  Sodium 139, potassium 3.6, serum creatinine 1.08.  LFTs were unremarkable.  INR was 7.7.  GI was consulted to assist with management. The patient states that she normally checks her INR at home.  She has noticed her INR has been climbing on her last check.  She claims that she did notify her PCP, but continue to take her warfarin.  She has not been on any recent antibiotics nor has been any changes on her medicines.  Patient has not complained of dysphagia since her dilation in April. She continues to lose weight due to lack of appetite. She follows with Dr. Jamey Reas for her right upper lobe lung cancer and was put on megace for appetite stimulation that has been unhelpful. She has been drinking 2-3 ensure a day at home   Recent chest imaging on 6/12 with no evidence of recurrent or  metastatic disease, clearing of patchy/peribronchovascular ground-glass , large right pleural effusion, and new cirrhosis not mentioned on prior imaging.    Assessment and Plan: * Food impaction of esophagus GI consulted Plans for EGD once INR is corrected Patient was given vitamin K 5 mg IV x1 on 08/18/2021 Continue protonix  Anorexia -- dietitian consult requested -- SLP eval requested for swallow function evaluation   Esophageal obstruction due to food impaction -- Pt had EGD with Dr. Jenetta Downer on 6/20  Pneumonitis Related to Keytruda Continue prednisone  Chronic kidney disease, stage 3a ([place]) Baseline creatinine 0.8-1.1 Stable at this time   Supratherapeutic INR - INR 7.7 -Hold warfarin -INR 1.9 in AM 08/19/2021 -GI requested 1 unit FFP which was ordered in the a.m. 08/19/2021 -Plan for EGD when INR is trending down  Hypothyroidism - Continue Synthroid  Thrombocytopenia ([place]) Recent CT scan June 2023 suggest cirrhosis of the liver warfarin may also contribute Monitor for signs of bleeding  Esophageal dysphagia -- pt / family concerned about swallow function, made NPO overnight -- SLP evaluation requested.  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    Objective: Vitals:   08/20/21 1050 08/20/21 1051 08/20/21 1323 08/20/21 1324  BP: (!) 180/97 (!) 170/100 (!) 142/101 (!) 160/100  Pulse: (!) 120  (!) 114 (!) 112  Resp: (!) 21 (!) 21 (!) 21 (!) 21  Temp: 97.7 F (36.5 C)  98 F (36.7 C)   TempSrc: Oral  Oral   SpO2: (!) 88% 95% 98%   Weight:      Height:        Intake/Output Summary (Last 24 hours) at 08/20/2021 1442 Last data filed at 08/20/2021 0600 Gross per 24 hour  Intake 1600 ml  Output --  Net 1600 ml   Weight change:  Exam:  General:  Pt is alert, follows commands appropriately, not in acute distress HEENT: No icterus, No thrush, No neck mass, [place]/AT Cardiovascular: normal S1/S2, no rubs, no gallops Respiratory: bibasilar crackles R>L Abdomen: Soft/+BS, non tender, non distended, no guarding Extremities: No edema, No lymphangitis, No petechiae, No rashes, no synovitis  Data Reviewed: I have personally reviewed following labs and imaging studies Basic Metabolic Panel: Recent Labs  Lab 08/18/21 1059 08/19/21 0454 08/20/21 0435  NA 140 139 138  K 3.5 3.6 3.6  CL 105 111 109  CO2 27 22 22   GLUCOSE 101* 67* 96  BUN 21 21 19   CREATININE 1.08* 0.87 0.80  CALCIUM 8.7* 8.1* 8.2*  MG  --  1.7 1.7   Liver Function Tests: Recent Labs  Lab 08/18/21 1059 08/19/21 0454 08/19/21 1320  AST 18 14*  --   ALT 22 17  --   ALKPHOS 77 58  --   BILITOT 1.0 1.3* 1.8*  PROT 6.5 4.9*  --   ALBUMIN 3.4* 2.6*  --    No results for input(s): "LIPASE", "AMYLASE" in the last 168 hours. No results for input(s): "AMMONIA" in the last 168 hours. Coagulation Profile: Recent Labs  Lab 08/18/21 1059 08/19/21 0454 08/19/21 1320 08/20/21 0435  INR 7.7* 1.9* 1.4* 2.4*   CBC: Recent Labs  Lab 08/18/21 1059 08/19/21 0454  08/20/21 0435  WBC 10.4 8.7 8.4  NEUTROABS  --  7.5  --   HGB 15.0 12.6 12.6  HCT 47.7* 40.5 41.3  MCV 96.2 98.1 97.9  PLT 135* 114* 107*   Cardiac Enzymes: No results for input(s): "CKTOTAL", "CKMB", "CKMBINDEX", "TROPONINI" in the last 168 hours. BNP: Invalid input(s): "POCBNP" CBG: Recent Labs  Lab 08/19/21 1959 08/20/21 0000 08/20/21 0508 08/20/21 0731 08/20/21 1139  GLUCAP 102* 98 99 108* 139*   HbA1C: No results for input(s): "HGBA1C" in the last 72 hours. Urine analysis:    Component Value Date/Time   COLORURINE YELLOW 08/18/2021 1405   APPEARANCEUR HAZY (A) 08/18/2021 1405   LABSPEC 1.018 08/18/2021 1405   PHURINE 5.0 08/18/2021 1405   GLUCOSEU NEGATIVE 08/18/2021 1405   HGBUR MODERATE (A) 08/18/2021 1405   BILIRUBINUR NEGATIVE 08/18/2021 1405   KETONESUR 5 (A) 08/18/2021 1405   PROTEINUR 30 (A) 08/18/2021 1405   UROBILINOGEN 0.2 08/29/2014 1151   NITRITE NEGATIVE 08/18/2021 1405   LEUKOCYTESUR NEGATIVE 08/18/2021 1405  Sepsis Labs:  Scheduled Meds:  amiodarone  200 mg Oral BID   irbesartan  150 mg Oral Daily   levothyroxine  50 mcg Oral QAC breakfast   metoprolol succinate  25 mg Oral Daily   metoprolol tartrate  5 mg Intravenous Once   pantoprazole (PROTONIX) IV  40 mg Intravenous Q12H   rosuvastatin  20 mg Oral QHS   warfarin  1 mg Oral ONCE-1600   Warfarin - Pharmacist Dosing Inpatient   Does not apply q1600   Continuous Infusions:  dextrose 5 % and 0.9% NaCl 50 mL/hr at 08/19/21 1957    Procedures/Studies: ECHOCARDIOGRAM COMPLETE  Result Date: 08/14/2021    ECHOCARDIOGRAM REPORT   Patient Name:   Katelyn [name] Date of Exam: 08/14/2021 Medical Rec #:  [id number]      Height:       70.0 in Accession #:    [id number]     Weight:       135.5 lb Date of Birth:  Jun 06, 1940      BSA:          1.769 m Patient Age:    44 years       BP:           95/69 mmHg Patient Gender: F              HR:           113 bpm. Exam Location:  [facility] Procedure: 2D  Echo, Cardiac Doppler and Color Doppler Indications:    I48.19 (ICD-10-CM) - Persistent atrial fibrillation (HCC)                 Z95.2 (ICD-10-CM) - S/P TAVR (transcatheter aortic valve                 replacement)  History:        Patient has prior history of Echocardiogram examinations, most                 recent 06/22/2019. CAD, COPD, Arrythmias:Atrial Fibrillation,                 Signs/Symptoms:Murmur; Risk Factors:Hypertension and                 Dyslipidemia. Aortic stenosis. Chronic bronchitis. Cancer of                 upper lobe of right lung ([place]). Chronic anticoagulation. S/P                 TAVR (transcatheter aortic valve replacement)- 26 mm Edwards                 Sapien.                 Aortic Valve: 26 mm Sapien prosthetic, stented (TAVR) valve is                 present in the aortic position.  Sonographer:    BERNARD WHITE RCS Referring Phys: [place]  1. Left ventricular ejection fraction, by estimation, is 50 to 55%. The left ventricle has low normal function. The left ventricle has no regional wall motion abnormalities. There is severe left ventricular hypertrophy. Left ventricular diastolic function could not be evaluated.  2. Right ventricular systolic function is normal. The right ventricular size is normal. There is normal pulmonary artery systolic pressure. The estimated right ventricular systolic pressure is 08.6 mmHg.  3. Left atrial size was moderately  dilated.  4. The pericardial effusion is posterior to the left ventricle.  5. The mitral valve is grossly normal. Trivial mitral valve regurgitation.  6. The aortic valve has been repaired/replaced. Aortic valve regurgitation is not visualized. No aortic stenosis is present. There is a 26 mm Sapien prosthetic (TAVR) valve present in the aortic position. Echo findings are consistent with normal structure and function of the aortic valve prosthesis.  7. The inferior vena cava is normal in size with greater than 50%  respiratory variability, suggesting right atrial pressure of 3 mmHg. Comparison(s): Changes from prior study are noted. 06/22/2019: LVEF 60-65%, TAVR with normal function. FINDINGS  Left Ventricle: Left ventricular ejection fraction, by estimation, is 50 to 55%. The left ventricle has low normal function. The left ventricle has no regional wall motion abnormalities. The left ventricular internal cavity size was normal in size. There is severe left ventricular hypertrophy. Left ventricular diastolic function could not be evaluated due to atrial fibrillation. Left ventricular diastolic function could not be evaluated. Right Ventricle: The right ventricular size is normal. No increase in right ventricular wall thickness. Right ventricular systolic function is normal. There is normal pulmonary artery systolic pressure. The tricuspid regurgitant velocity is 2.75 m/s, and  with an assumed right atrial pressure of 3 mmHg, the estimated right ventricular systolic pressure is 65.4 mmHg. Left Atrium: Left atrial size was moderately dilated. Right Atrium: Right atrial size was normal in size. Pericardium: Trivial pericardial effusion is present. The pericardial effusion is posterior to the left ventricle. Mitral Valve: The mitral valve is grossly normal. Trivial mitral valve regurgitation. Tricuspid Valve: The tricuspid valve is grossly normal. Tricuspid valve regurgitation is mild. Aortic Valve: The aortic valve has been repaired/replaced. Aortic valve regurgitation is not visualized. No aortic stenosis is present. There is a 26 mm Sapien prosthetic, stented (TAVR) valve present in the aortic position. Echo findings are consistent with normal structure and function of the aortic valve prosthesis. Pulmonic Valve: The pulmonic valve was grossly normal. Pulmonic valve regurgitation is not visualized. Aorta: The aortic root and ascending aorta are structurally normal, with no evidence of dilitation. Venous: The inferior vena cava  is normal in size with greater than 50% respiratory variability, suggesting right atrial pressure of 3 mmHg. IAS/Shunts: No atrial level shunt detected by color flow Doppler. Additional Comments: A venous catheter is visualized.  LEFT VENTRICLE PLAX 2D LVIDd:         3.40 cm LVIDs:         2.50 cm LV PW:         1.30 cm LV IVS:        1.80 cm LVOT diam:     1.50 cm LV SV:         23 LV SV Index:   13 LVOT Area:     1.77 cm  RIGHT VENTRICLE TAPSE (M-mode): 1.4 cm LEFT ATRIUM             Index        RIGHT ATRIUM           Index LA diam:        3.70 cm 2.09 cm/m   RA Area:     20.20 cm LA Vol (A2C):   90.4 ml 51.10 ml/m  RA Volume:   56.80 ml  32.11 ml/m LA Vol (A4C):   59.4 ml 33.58 ml/m LA Biplane Vol: 72.9 ml 41.21 ml/m  AORTIC VALVE LVOT Vmax:   89.67 cm/s LVOT Vmean:  59.300  cm/s LVOT VTI:    0.131 m  AORTA Ao Root diam: 3.10 cm MITRAL VALVE                TRICUSPID VALVE MV Area (PHT): 6.17 cm     TR Peak grad:   30.2 mmHg MV Decel Time: 123 msec     TR Vmax:        275.00 cm/s MV E velocity: 130.00 cm/s                             SHUNTS                             Systemic VTI:  0.13 m                             Systemic Diam: 1.50 cm Lyman Bishop MD Electronically signed by Lyman Bishop MD Signature Date/Time: 08/14/2021/5:03:38 PM    Final    CT Chest W Contrast  Result Date: 08/13/2021 CLINICAL DATA:  Non-small cell lung cancer, recently stopped chemotherapy. * Tracking Code: BO * EXAM: CT CHEST WITH CONTRAST TECHNIQUE: Multidetector CT imaging of the chest was performed during intravenous contrast administration. RADIATION DOSE REDUCTION: This exam was performed according to the departmental dose-optimization program which includes automated exposure control, adjustment of the mA and/or kV according to patient size and/or use of iterative reconstruction technique. CONTRAST:  24mL OMNIPAQUE IOHEXOL 300 MG/ML  SOLN COMPARISON:  PET 05/08/2021 and CT chest 12/16/2020. FINDINGS: Cardiovascular:  Left IJ Port-A-Cath terminates in the SVC. Atherosclerotic calcification of the aorta and coronary arteries. Aortic valve replacement. Heart is enlarged. No pericardial effusion. Mediastinum/Nodes: Low internal jugular and mediastinal lymph nodes are not enlarged by CT size criteria. No hilar or axillary adenopathy. Esophagus is grossly unremarkable. Lungs/Pleura: Biapical pleuroparenchymal scarring. Centrilobular emphysema. Right upper lobectomy. Interval clearing of previously seen patchy/peribronchovascular ground-glass on 05/08/2021. Large right pleural effusion, slightly increased from 05/08/2021. Adjacent compressive atelectasis in the right lung. Clustered peribronchovascular nodularity in the lingula, unchanged and likely postinfectious in etiology. No new pulmonary nodules. No left pleural fluid. Airway is otherwise unremarkable. Upper Abdomen: Liver margin is irregular. Visualized portions of the liver, adrenal glands, kidneys, spleen, pancreas and stomach are otherwise grossly unremarkable. Musculoskeletal: No worrisome lytic or sclerotic lesions. Right thoracotomy. IMPRESSION: 1. No evidence of recurrent or metastatic disease. 2. Interval clearing of patchy/peribronchovascular ground-glass seen on 05/08/2021, possibly due to therapy-related cessation of treatment. 3. Large right pleural effusion, increased from slightly increased from 05/08/2021. 4. Cirrhosis. 5. Aortic atherosclerosis (ICD10-I70.0). Coronary artery calcification. 6.  Emphysema (ICD10-J43.9). Electronically Signed   By: Lorin Picket M.D.   On: 08/13/2021 08:33   VAS US CAROTID  Result Date: 08/02/2021 Carotid Arterial Duplex Study Patient Name:  Katelyn [name]  Date of Exam:   08/01/2021 Medical Rec #: [id number]       Accession #:    [id number] Date of Birth: Jul 27, 1940       Patient Gender: F Patient Age:   57 years Exam Location:  [facility] Procedure:      VAS US CAROTID Referring Phys: Shelva Majestic  --------------------------------------------------------------------------------  Indications:       Carotid artery disease. Risk Factors:      Hypertension, hyperlipidemia, past history of smoking,  coronary artery disease, PAD. Other Factors:     Patient denies any cerebrovascular symptoms. Comparison Study:  07/4006-QPYP 950/93 cm/s; LICA 26/71 cm/s Performing Technologist: Wilkie Aye RVT  Examination Guidelines: A complete evaluation includes B-mode imaging, spectral Doppler, color Doppler, and power Doppler as needed of all accessible portions of each vessel. Bilateral testing is considered an integral part of a complete examination. Limited examinations for reoccurring indications may be performed as noted.  Right Carotid Findings: +----------+--------+--------+--------+--------------------------+--------+           PSV cm/sEDV cm/sStenosisPlaque Description        Comments +----------+--------+--------+--------+--------------------------+--------+ CCA Prox  78      11              heterogenous                       +----------+--------+--------+--------+--------------------------+--------+ CCA Mid   55      10      <50%    heterogenous                       +----------+--------+--------+--------+--------------------------+--------+ CCA Distal26      4               heterogenous                       +----------+--------+--------+--------+--------------------------+--------+ ICA Prox  208     55      40-59%  heterogenous and irregular         +----------+--------+--------+--------+--------------------------+--------+ ICA Mid   120     33                                                 +----------+--------+--------+--------+--------------------------+--------+ ICA Distal73      25                                                 +----------+--------+--------+--------+--------------------------+--------+ ECA       79      2                heterogenous                       +----------+--------+--------+--------+--------------------------+--------+ +----------+--------+-------+----------------+-------------------+           PSV cm/sEDV cmsDescribe        Arm Pressure (mmHG) +----------+--------+-------+----------------+-------------------+ [id number]            Multiphasic, [id number]                 +----------+--------+-------+----------------+-------------------+ +---------+--------+--+--------+-+----------------------------+ VertebralPSV cm/s20EDV cm/s4Antegrade and High resistant +---------+--------+--+--------+-+----------------------------+  Left Carotid Findings: +----------+--------+--------+--------+--------------------------+--------+           PSV cm/sEDV cm/sStenosisPlaque Description        Comments +----------+--------+--------+--------+--------------------------+--------+ CCA Prox  32      7               heterogenous                       +----------+--------+--------+--------+--------------------------+--------+ CCA Mid   30      7       <50%    heterogenous                       +----------+--------+--------+--------+--------------------------+--------+  CCA Distal34      11              heterogenous                       +----------+--------+--------+--------+--------------------------+--------+ ICA Prox  75      15      1-39%   heterogenous and irregular         +----------+--------+--------+--------+--------------------------+--------+ ICA Mid   63      20                                                 +----------+--------+--------+--------+--------------------------+--------+ ICA Distal74      20                                                 +----------+--------+--------+--------+--------------------------+--------+ ECA       240     29      >50%    heterogenous and irregular          +----------+--------+--------+--------+--------------------------+--------+ +----------+--------+--------+----------------+-------------------+           PSV cm/sEDV cm/sDescribe        Arm Pressure (mmHG) +----------+--------+--------+----------------+-------------------+ [id number]             Multiphasic, [id number]                 +----------+--------+--------+----------------+-------------------+ +---------+--------+--+--------+-+----------------------------+ VertebralPSV cm/s26EDV cm/s6Antegrade and High resistant +---------+--------+--+--------+-+----------------------------+   Summary: Right Carotid: Velocities in the right ICA are consistent with a 40-59%                stenosis. Non-hemodynamically significant plaque <50% noted in                the CCA. Left Carotid: Velocities in the left ICA are consistent with a 1-39% stenosis.               Non-hemodynamically significant plaque <50% noted in the CCA. The               ECA appears >50% stenosed. Vertebrals:  Bilateral vertebral arteries demonstrate antegrade flow. Bilateral              vertebral arteries demonstrate high resistant flow. Subclavians: Normal flow hemodynamics were seen in bilateral subclavian              arteries. *See table(s) above for measurements and observations.  Electronically signed by Carlyle Dolly MD on 08/02/2021 at 3:45:09 PM.    Final     Irwin Brakeman, MD  [facility]  If 7PM-7AM, please contact night-coverage [email or web address] Password [email or web address] 08/20/2021, 2:42 PM   LOS: 1 day

## 2021-08-20 NOTE — Plan of Care (Signed)

## 2021-08-20 NOTE — Assessment & Plan Note (Signed)
--   Pt had EGD with Dr. Jenetta Downer on 6/20

## 2021-08-20 NOTE — Progress Notes (Signed)
Rechecked patients vital signs due to patient yellow MEWS, T-98.0, R-21, BP-142/101, O2-98% at 1 liter, HR between 105/120, checked BP manually was 160/100. MD Wynetta Emery made aware. New orders placed.

## 2021-08-20 NOTE — Progress Notes (Signed)
ANTICOAGULATION CONSULT NOTE -  Pharmacy Consult for coumadin Indication: atrial fibrillation  Allergies  Allergen Reactions   Adhesive [Tape] Rash   Latex Rash   Tetanus Toxoids Rash   Wound Dressing Adhesive Rash    Patient Measurements: Height: 5\' 10"  (177.8 cm) Weight: 61.2 kg (135 lb) IBW/kg (Calculated) : 68.5 Heparin Dosing Weight:   Vital Signs: Temp: 98.5 F (36.9 C) (06/21 0422) BP: 149/84 (06/21 0422) Pulse Rate: 92 (06/21 0422)  Labs: Recent Labs    08/18/21 1059 08/18/21 2027 08/19/21 0454 08/19/21 1320 08/20/21 0435  HGB 15.0  --  12.6  --  12.6  HCT 47.7*  --  40.5  --  41.3  PLT 135*  --  114*  --  107*  LABPROT 64.8*  --  21.3* 17.1* 25.8*  INR 7.7*  --  1.9* 1.4* 2.4*  CREATININE 1.08*  --  0.87  --  0.80  TROPONINIHS 26* 25*  --   --   --      Estimated Creatinine Clearance: 56 mL/min (by C-G formula based on SCr of 0.8 mg/dL).   Medical History: Past Medical History:  Diagnosis Date   A-fib [facility])    Allergy    rhinitis   Aortic stenosis, severe    a. s/p TAVR 08/2014.   Arthritis    spine and various joints   Cancer of upper lobe of right lung ([place]) 09/04/2020   Carotid artery disease ([place])    Cataract    Cerebrovascular disease 12/05/2018   Childhood asthma    Chronic bronchitis ([place])        Chronic kidney disease 02/17/2021   Claudication ([place])    COPD GOLD II 07/18/2020   Coronary artery disease    a. April 2012 which revealed mid RCA occlusion with collaterals, 50% LAD stenosis, 30% circumflex stenosis, and 40% marginal stenosis   First degree AV block    GERD (gastroesophageal reflux disease)    Heart murmur    Hyperlipidemia    Hypertension    Hypothyroidism 11/14/2020   Left bundle branch block    LUMBAR RADICULOPATHY, RIGHT    PAF (paroxysmal atrial fibrillation) ([place])    Peripheral vascular disease ([place])    a. s/p aortobifem bypass graft surgery and bilateral fundoplasty by [facility].   Renal artery stenosis ([place])     a. renal artery stenosis (>60% L renal artery) by duplex 04/2020.   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 bilateral subclavian stenosis with antegrade flow by duplex 07/2020    Medications:  Medications Prior to Admission  Medication Sig Dispense Refill Last Dose   acetaminophen (TYLENOL) 500 MG tablet Take 500 mg by mouth every 6 (six) hours as needed for moderate pain.   UNK   albuterol (VENTOLIN HFA) 108 (90 Base) MCG/ACT inhaler Inhale 2 puffs into the lungs 2 (two) times daily as needed for wheezing or shortness of breath. 8 g 2 UNK   amiodarone (PACERONE) 200 MG tablet Take 1 tablet (200 mg total) by mouth 2 (two) times daily. 60 tablet 6 08/16/2021   estradiol (ESTRACE) 2 MG tablet Take 1 tablet (2 mg total) by mouth daily. 90 tablet 0 08/16/2021  furosemide (LASIX) 20 MG tablet Take 1 tablet (20 mg total) by mouth daily. 30 tablet 4 08/16/2021   ipratropium (ATROVENT) 0.06 % nasal spray USE 1 SPRAY(S) IN EACH NOSTRIL EVERY 6 HOURS AS NEEDED FOR RUNNY NOSE (Patient taking differently: Place 1 spray into both nostrils every 6 (six) hours as needed for rhinitis.) 15 mL 2 Past Week   irbesartan (AVAPRO) 300 MG tablet Take 0.5 tablets (150 mg total) by mouth daily. (Patient taking differently: Take 150 mg by mouth in the morning and at bedtime.) 90 tablet 0 08/16/2021   levothyroxine (SYNTHROID) 50 MCG tablet Take 1 tablet (50 mcg total) by mouth daily before breakfast. 30 tablet 3 08/16/2021   meloxicam (MOBIC) 15 MG tablet Take 15 mg by mouth daily as needed for pain.   Past Week   metoCLOPramide (REGLAN) 5 MG tablet Take 2 tablets (10 mg total) by mouth every 8 (eight) hours as needed for nausea, vomiting or  refractory nausea / vomiting. (Patient taking differently: Take 5 mg by mouth every 8 (eight) hours as needed for nausea, vomiting or refractory nausea / vomiting.) 60 tablet 3 08/16/2021   metoprolol succinate (TOPROL-XL) 25 MG 24 hr tablet Take 1 tablet by mouth once daily (Patient taking differently: Take 25 mg by mouth daily.) 90 tablet 0 08/16/2021 at pm   pantoprazole (PROTONIX) 40 MG tablet TAKE 1 TABLET BY MOUTH ONCE DAILY BEFORE SUPPER (Patient taking differently: Take 40 mg by mouth daily.) 90 tablet 3 08/16/2021   predniSONE (DELTASONE) 20 MG tablet Take 1 tablet (20 mg total) by mouth daily with breakfast. (Patient taking differently: Take 10 mg by mouth daily with breakfast.) 30 tablet 0 08/16/2021   prochlorperazine (COMPAZINE) 10 MG tablet Take 1 tablet (10 mg total) by mouth every 6 (six) hours as needed (Nausea or vomiting). 60 tablet 3 Past Week   rosuvastatin (CRESTOR) 20 MG tablet Take 1 tablet (20 mg total) by mouth at bedtime. 90 tablet 0 08/16/2021   warfarin (COUMADIN) 2.5 MG tablet Take 1 tablet (2.5 mg total) by mouth at bedtime.   08/16/2021 at 1900   lidocaine-prilocaine (EMLA) cream Apply a small amount to port a cath site and cover with plastic wrap 1 hour prior to infusion appointments 30 g 3    megestrol (MEGACE) 400 MG/10ML suspension Take 10 mLs (400 mg total) by mouth 2 (two) times daily. (Patient not taking: Reported on 08/18/2021) 480 mL 3 Not Taking   nitroGLYCERIN (NITROSTAT) 0.4 MG SL tablet Place 0.4 mg under the tongue every 5 (five) minutes as needed for chest pain.   UNK   Scheduled:   amiodarone  200 mg Oral BID   irbesartan  150 mg Oral Daily   levothyroxine  50 mcg Oral QAC breakfast   metoprolol succinate  25 mg Oral Daily   pantoprazole (PROTONIX) IV  40 mg Intravenous Q12H   rosuvastatin  20 mg Oral QHS   warfarin  4 mg Oral Once   Warfarin - Pharmacist Dosing Inpatient   Does not apply q1600   Infusions:   dextrose 5 % and 0.9% NaCl 50 mL/hr at  08/19/21 1957    Assessment: Pt has been on coumadin for her AF/CVA prior to admission. Her admission INR was 7.7. She presented with dysphagia. EGD today didn't show any specific cause. She was give 5mg  of vitamin k on 6/19. Coumadin is being restarted today per protocol.   INR 1.4 > 2.4. warfarin ordered for 6/20 but not given due  to patient unable to swallow. Unclear reason for INR increase.  Patient also on amiodarone 200 mg twice daily but has been on that prior to admission- dose recently increased in may from once daily to twice daily.  Goal of Therapy:  INR 2-3 Monitor platelets by anticoagulation protocol: Yes   Plan:  Coumadin 1 mg PO x1 Daily INR   Margot Ables, PharmD Clinical Pharmacist 08/20/2021 10:35 AM

## 2021-08-20 NOTE — Progress Notes (Signed)
   08/20/21 1458  Assess: MEWS Score  Temp 98.4 F (36.9 C)  BP 125/80  MAP (mmHg) 94  Pulse Rate 95  Resp 20  SpO2 97 %  O2 Device Nasal Cannula  O2 Flow Rate (L/min) 1 L/min  Assess: MEWS Score  MEWS Temp 0  MEWS Systolic 0  MEWS Pulse 0  MEWS RR 0  MEWS LOC 0  MEWS Score 0  MEWS Score Color Green  Assess: if the MEWS score is Yellow or Red  Were vital signs taken at a resting state? Yes  Focused Assessment Change from prior assessment (see assessment flowsheet)  Treat  Pain Scale 0-10  Pain Score 0  Document  Patient Outcome Stabilized after interventions  Assess: SIRS CRITERIA  SIRS Temperature  0  SIRS Pulse 1  SIRS Respirations  0  SIRS WBC 0  SIRS Score Sum  1

## 2021-08-20 NOTE — Consult Note (Cosign Needed)
Consultation Note Date: 08/20/2021   Patient Name: Katelyn [name]  DOB: August 07, 1940  MRN: [id number]  Age / Sex: 81 y.o., female  PCP: Katelyn Squibb, MD Referring Physician: Murlean Iba, MD  Reason for Consultation: Establishing goals of care  HPI/Patient Profile: 81 y.o. female  with past medical history of cancer of upper lobe of right lung followed by Katelyn [name] stopped Beryle Flock due to pneumonitis, not currently taking treatments, chronic dysphagia since dilation in April of this year with continued weight loss and poor appetite/Megace and helpful, A-fib, aortic stenosis status post TAVR, CAD, GERD, HTN/HLD, hypothyroid, history of stroke, PVD, admitted on 08/18/2021 with food impaction of esophagus, anorexia.   Clinical Assessment and Goals of Care: I have reviewed medical records including EPIC notes, labs and imaging, received report from RN, assessed the patient.  Katelyn [name] is sitting up quietly in bed.  She greets me, making and mostly keeping eye contact.  She is alert and oriented, able to make her basic needs known.  Her stepdaughter, Katelyn [name], is present at bedside.  We meet at the bedside to discuss diagnosis prognosis, GOC, EOL wishes, disposition and options.  I introduced Palliative Medicine as specialized medical care for people living with serious illness. It focuses on providing relief from the symptoms and stress of a serious illness. The goal is to improve quality of life for both the patient and the family.  We talked about her acute health problems including, but not limited to, speech therapy consult and recommendations, GI consult, EGD, recommendations, pured diet, nutritional supplements.  We also talk about her lung cancer, treatments with Katelyn [name] her trusted oncologist, not currently taking chemo/immunotherapy.  The natural disease trajectory and expectations at EOL were  discussed.  Advanced directives, concepts specific to code status, artifical feeding and hydration, and rehospitalization were considered and discussed.  Katelyn [name] endorses DNR  Hospice and Palliative Care services outpatient were explained and offered.  Katelyn [name] shares that her husband had hospice type care in their home.  She states that she is agreeable to [name] for outpatient palliative/hospice services.  Discussed the importance of continued conversation with family and the medical providers regarding overall plan of care and treatment options, ensuring decisions are within the context of the patient's values and GOCs.  Questions and concerns were addressed.  The family was encouraged to call with questions or concerns.  PMT will continue to support holistically.  Conference with attending, bedside nursing staff, transition of care team related to patient condition, needs, goals of care, disposition.   HCPOA HCPOA -granddaughter, Katelyn [name].  Paperwork noted in ACP tab of epic chart.  Paperwork completed June 2022.    SUMMARY OF RECOMMENDATIONS   At this point continue to treat the treatable but no CPR or intubation.   Time for outcomes. ST recommendations for barium pill swallow study Outpatient palliative/hospice care with [facility] [place] Planning: DNR  Symptom Management:  Per hospitalist, no additional needs  at this time.  Palliative Prophylaxis:  Frequent Pain Assessment and Oral Care  Additional Recommendations (Limitations, Scope, Preferences): Continue to treat the treatable but no CPR or intubation.  Psycho-social/Spiritual:  Desire for further Chaplaincy support:yes Additional Recommendations: Caregiving  Support/Resources  Prognosis:  Unable to determine, based on outcomes.  Discharge Planning:  To be determined, anticipate home with home health.       Primary Diagnoses: Present on  Admission:  Supratherapeutic INR  Mixed hyperlipidemia  Hypothyroidism  Essential hypertension  Food impaction of esophagus  Persistent atrial fibrillation (HCC)  Chronic kidney disease, stage 3a (HCC)  Thrombocytopenia (HCC)  Anorexia  Esophageal dysphagia   I have reviewed the medical record, interviewed the patient and family, and examined the patient. The following aspects are pertinent.  Past Medical History:  Diagnosis Date   A-fib [name])    Allergy    rhinitis   Aortic stenosis, severe    a. s/p TAVR 08/2014.   Arthritis    spine and various joints   Cancer of upper lobe of right lung (Katelyn [name]) 09/04/2020   Carotid artery disease (Katelyn [name])    Cataract    Cerebrovascular disease 12/05/2018   Childhood asthma    Chronic bronchitis (Katelyn [name])        Chronic kidney disease 02/17/2021   Claudication (Katelyn [name])    COPD GOLD II 07/18/2020   Coronary artery disease    a. April 2012 which revealed mid RCA occlusion with collaterals, 50% LAD stenosis, 30% circumflex stenosis, and 40% marginal stenosis   First degree AV block    GERD (gastroesophageal reflux disease)    Heart murmur    Hyperlipidemia    Hypertension    Hypothyroidism 11/14/2020   Left bundle branch block    LUMBAR RADICULOPATHY, RIGHT    PAF (paroxysmal atrial fibrillation) (Katelyn [name])    Peripheral vascular disease (Katelyn [name])    a. s/p aortobifem bypass graft surgery and bilateral fundoplasty by [facility].   Renal artery stenosis (Katelyn [name])    a. renal artery stenosis (>60% L renal artery) by duplex 04/2020.   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 Retired,used to work at [employer].Lives alone.  Patient has one daughter, 3 grands   Likes to crochet   Works in [employer].    Social Determinants of Health   Financial Resource Strain: Low Risk  (03/10/2017)   Overall Financial Resource Strain (CARDIA)    Difficulty of Paying Living Expenses: Not very hard  Food Insecurity: No Food Insecurity (12/17/2020)   Hunger Vital Sign    Worried About Running Out of Food in the Last Year: Never true    Ran Out of Food in the Last Year: Never true  Transportation Needs: No Transportation Needs (12/17/2020)   PRAPARE - Hydrologist (Medical): No    Lack of Transportation (Non-Medical): No  Physical Activity: Inactive (03/10/2017)   Exercise Vital Sign    Days of Exercise per Week: 0 days    Minutes of Exercise per Session: 0 min  Stress: No Stress Concern Present (03/10/2017)   [place]    Feeling of Stress : Only a little  Social Connections: Unknown (03/10/2017)   Social Connection and Isolation Panel [NHANES]    Frequency of Communication with Friends and Family: More than three times a week    Frequency of Social Gatherings with Friends  and Family: More than three times a week    Attends Religious Services: Patient refused    Marine scientist or Organizations: Patient refused    Attends Archivist Meetings: Never    Marital Status: Married   Family History  Problem Relation Age of Onset   Heart disease Mother 58       died young   Varicose Veins Mother    Stroke Father    Hypertension Father    Heart disease Father 66       Aneurysm   Heart attack Father    Arthritis Brother    Stroke Brother    Diabetes Daughter    Arthritis Daughter    Heart attack Brother        unsure   Allergic rhinitis Neg Hx    Angioedema Neg Hx    Asthma Neg Hx    Atopy Neg Hx    Eczema Neg Hx    Immunodeficiency Neg Hx    Urticaria Neg Hx    Scheduled Meds:  amiodarone  200 mg Oral BID   irbesartan  150 mg Oral Daily   levothyroxine  50 mcg Oral QAC breakfast   metoprolol succinate  25 mg Oral Daily   metoprolol tartrate  5 mg Intravenous Once   pantoprazole (PROTONIX) IV  40 mg Intravenous Q12H   rosuvastatin  20 mg Oral QHS   Warfarin - Pharmacist Dosing Inpatient   Does not apply q1600   Continuous Infusions:  dextrose 5 % and 0.9% NaCl Stopped (08/20/21 1514)   PRN Meds:.acetaminophen **OR** acetaminophen, albuterol, ondansetron **OR** ondansetron (ZOFRAN) IV, oxyCODONE, prochlorperazine Medications Prior to Admission:  Prior to Admission medications   Medication Sig Start Date End Date Taking? Authorizing Provider  acetaminophen (TYLENOL) 500 MG tablet Take 500 mg by mouth every 6 (six) hours as needed for moderate pain.   Yes [provider]  albuterol (VENTOLIN HFA) 108 (90 Base) MCG/ACT inhaler Inhale 2 puffs into the lungs 2 (two) times daily as needed for wheezing or shortness of breath. 06/12/21  Yes Derek Jack, MD  amiodarone (PACERONE) 200 MG tablet Take 1 tablet (200 mg total) by mouth 2 (two) times daily. 07/23/21  Yes Shelva Majestic  A, MD  estradiol (ESTRACE) 2 MG tablet Take  1 tablet (2 mg total) by mouth daily. 10/23/20  Yes Ailene Ards, NP  furosemide (LASIX) 20 MG tablet Take 1 tablet (20 mg total) by mouth daily. 07/04/21  Yes Pennington, Rebekah M, PA-C  ipratropium (ATROVENT) 0.06 % nasal spray USE 1 SPRAY(S) IN EACH NOSTRIL EVERY 6 HOURS AS NEEDED FOR RUNNY NOSE Patient taking differently: Place 1 spray into both nostrils every 6 (six) hours as needed for rhinitis. 10/22/20  Yes Ailene Ards, NP  irbesartan (AVAPRO) 300 MG tablet Take 0.5 tablets (150 mg total) by mouth daily. Patient taking differently: Take 150 mg by mouth in the morning and at bedtime. 06/27/21  Yes Johnson, Clanford L, MD  levothyroxine (SYNTHROID) 50 MCG tablet Take 1 tablet (50 mcg total) by mouth daily before breakfast. 04/22/21  Yes Derek Jack, MD  meloxicam (MOBIC) 15 MG tablet Take 15 mg by mouth daily as needed for pain. 08/11/21  Yes [provider]  metoCLOPramide (REGLAN) 5 MG tablet Take 2 tablets (10 mg total) by mouth every 8 (eight) hours as needed for nausea, vomiting or refractory nausea / vomiting. Patient taking differently: Take 5 mg by mouth every 8 (eight) hours as needed for nausea, vomiting or refractory nausea / vomiting. 08/04/21  Yes Derek Jack, MD  metoprolol succinate (TOPROL-XL) 25 MG 24 hr tablet Take 1 tablet by mouth once daily Patient taking differently: Take 25 mg by mouth daily. 04/18/21  Yes Troy Sine, MD  pantoprazole (PROTONIX) 40 MG tablet TAKE 1 TABLET BY MOUTH ONCE DAILY BEFORE SUPPER Patient taking differently: Take 40 mg by mouth daily. 07/16/20  Yes Erenest Rasher, PA-C  predniSONE (DELTASONE) 20 MG tablet Take 1 tablet (20 mg total) by mouth daily with breakfast. Patient taking differently: Take 10 mg by mouth daily with breakfast. 08/04/21  Yes Derek Jack, MD  prochlorperazine (COMPAZINE) 10 MG tablet Take 1 tablet (10 mg total) by mouth every 6 (six) hours as needed (Nausea or vomiting). 08/04/21  Yes  Derek Jack, MD  rosuvastatin (CRESTOR) 20 MG tablet Take 1 tablet (20 mg total) by mouth at bedtime. 10/23/20  Yes Ailene Ards, NP  warfarin (COUMADIN) 2.5 MG tablet Take 1 tablet (2.5 mg total) by mouth at bedtime. 06/27/21  Yes Johnson, Clanford L, MD  lidocaine-prilocaine (EMLA) cream Apply a small amount to port a cath site and cover with plastic wrap 1 hour prior to infusion appointments 12/16/20   Derek Jack, MD  megestrol (MEGACE) 400 MG/10ML suspension Take 10 mLs (400 mg total) by mouth 2 (two) times daily. Patient not taking: Reported on 08/18/2021 08/04/21   Derek Jack, MD  nitroGLYCERIN (NITROSTAT) 0.4 MG SL tablet Place 0.4 mg under the tongue every 5 (five) minutes as needed for chest pain. 04/04/20   [provider]   Allergies  Allergen Reactions   Adhesive [Tape] Rash   Latex Rash   Tetanus Toxoids Rash   Wound Dressing Adhesive Rash   Review of Systems  Unable to perform ROS: Other    Physical Exam Vitals and nursing note reviewed.  Constitutional:      General: She is not in acute distress.    Appearance: She is ill-appearing.  HENT:     Mouth/Throat:     Mouth: Mucous membranes are moist.  Cardiovascular:     Rate and Rhythm: Normal rate.  Pulmonary:     Effort: Pulmonary effort is normal. No respiratory distress.  Skin:    General: Skin is warm and dry.  Neurological:     Mental Status: She is alert and oriented to person, place, and time.  Psychiatric:        Mood and Affect: Mood normal.        Behavior: Behavior normal.     Vital Signs: BP 125/80 (BP Location: Left Arm)   Pulse 95   Temp 98.4 F (36.9 C) (Oral)   Resp 20   Ht 5\' 10"  (1.778 m)   Wt 61.2 kg   SpO2 97%   BMI 19.37 kg/m  Pain Scale: 0-10   Pain Score: 0-No pain   SpO2: SpO2: 97 % O2 Device:SpO2: 97 % O2 Flow Rate: .O2 Flow Rate (L/min): 1 L/min  IO: Intake/output summary:  Intake/Output Summary (Last 24 hours) at 08/20/2021 1557 Last  data filed at 08/20/2021 1515 Gross per 24 hour  Intake 2050.09 ml  Output --  Net 2050.09 ml    LBM: Last BM Date : 08/17/21 Baseline Weight: Weight: 61.2 kg Most recent weight: Weight: 61.2 kg     Palliative Assessment/Data:   Flowsheet Rows    Flowsheet Row Most Recent Value  Intake Tab   Referral Department Hospitalist  Unit at Time of Referral Cardiac/Telemetry Unit  Palliative Care Primary Diagnosis Other (Comment)  Date Notified 08/20/21  Palliative Care Type New Palliative care  Reason for referral Clarify Goals of Care  Date of Admission 08/18/21  Date first seen by Palliative Care 08/20/21  # of days Palliative referral response time 0 Day(s)  # of days [name] prior to Palliative referral 2  Clinical Assessment   Palliative Performance Scale Score 40%  Pain Max last 24 hours Not able to report  Pain Min Last 24 hours Not able to report  Dyspnea Max Last 24 Hours Not able to report  Dyspnea Min Last 24 hours Not able to report  Psychosocial & Spiritual Assessment   Palliative Care Outcomes        Time In: 1400 Time Out: 1515 Time Total: 75 minutes   Greater than 50%  of this time was spent counseling and coordinating care related to the above assessment and plan.  Signed by: Drue Novel, NP   Please contact Palliative Medicine Team phone at [phone] for questions and concerns.  For individual provider: See Shea Evans

## 2021-08-20 NOTE — Progress Notes (Signed)
Checked vitals prior to IV Lopressor order due at 1430, vital signs: BP-125/80, R-20, T-98.4, P-95, O2-97% at 1 liter. MD Wynetta Emery made aware. New orders given, give patient morning amiodarone and metoprolol PO. Lopressor IV not given.

## 2021-08-20 NOTE — Assessment & Plan Note (Signed)
--   pt / family concerned about swallow function, made NPO overnight -- SLP evaluation requested.  I reached out to GI service again to make them aware on 6/21  -- follow up SLP evaluation

## 2021-08-20 NOTE — Progress Notes (Signed)
   08/20/21 1050  Assess: MEWS Score  Temp 97.7 F (36.5 C)  BP (!) 180/97  MAP (mmHg) 121  Pulse Rate (!) 120  Resp (!) 21  SpO2 (!) 88 %  O2 Device Room Air  Assess: MEWS Score  MEWS Temp 0  MEWS Systolic 0  MEWS Pulse 2  MEWS RR 1  MEWS LOC 0  MEWS Score 3  MEWS Score Color Yellow  Assess: if the MEWS score is Yellow or Red  Were vital signs taken at a resting state? Yes  Focused Assessment Change from prior assessment (see assessment flowsheet)  Treat  Pain Scale 0-10  Pain Score 0  Take Vital Signs  Increase Vital Sign Frequency  Yellow: Q 2hr X 2 then Q 4hr X 2, if remains yellow, continue Q 4hrs  Escalate  MEWS: Escalate Yellow: discuss with charge nurse/RN and consider discussing with provider and RRT  Notify: Charge Nurse/RN  Name of Charge Nurse/RN Notified Engineer, materials  Date Charge Nurse/RN Notified 08/20/21  Time Charge Nurse/RN Notified 1100  Notify: Provider  Provider Name/Title MD Wynetta Emery  Date Provider Notified 08/20/21  Time Provider Notified 1100  Method of Notification Page (Secure chat)  Notification Reason Other (Comment) (Yellow MEWS)  Provider response See new orders  Date of Provider Response 08/20/21  Time of Provider Response 1105  Assess: SIRS CRITERIA  SIRS Temperature  0  SIRS Pulse 1  SIRS Respirations  1  SIRS WBC 0  SIRS Score Sum  2

## 2021-08-20 NOTE — Assessment & Plan Note (Signed)
--   dietitian consult requested -- SLP eval requested for swallow function evaluation

## 2021-08-20 NOTE — Progress Notes (Signed)
Patient complaining of SOB, vital signs: T-97.7, P-120, R-21, BP-180/97, O2-88% on RA. Placed patient on 1 liter of oxygen, oxygen saturation 95%. Manually checked BP was 170/100. Noted patients pulse between 114-130 via tele monitor. EKG obtained and placed in chart. Patient reported difficulty voiding. Bladder scanned patient only 255 in bladder. MD Wynetta Emery made aware. New orders placed, patient given Lopressor 5 mg IV and in and out cath patient. Output for in/out cath was 300 ml.

## 2021-08-20 NOTE — TOC Initial Note (Signed)
Transition of Care [facility]) - Initial/Assessment Note    Patient Details  Name: Katelyn [name] MRN: [id number] Date of Birth: 1940/04/22  Transition of Care [facility]) CM/SW Contact:    Ihor Gully, LCSW Phone Number: 08/20/2021, 2:10 PM  Clinical Narrative:                 Patient from home with step-daughter. Ambulates with a walker. Having a lift chair delivered on Thursday. Her granddaughter's husband provides transportation to appointments. [place] services with [facility] recently closed in May.  TOC will follow and address needs as they arise.   Expected Discharge Plan: [place] Barriers to Discharge: Continued Medical Work up   Patient Goals and CMS Choice Patient states their goals for this hospitalization and ongoing recovery are:: to go home      Expected Discharge Plan and Services Expected Discharge Plan: [place]       Living arrangements for the past 2 months: Single Family Home                                      Prior Living Arrangements/Services Living arrangements for the past 2 months: Single Family Home Lives with:: Adult Children Patient language and need for interpreter reviewed:: Yes        Need for Family Participation in Patient Care: Yes (Comment) Care giver support system in place?: Yes (comment) Current home services: DME (walker) Criminal Activity/Legal Involvement Pertinent to Current Situation/Hospitalization: No - Comment as needed  Activities of Daily Living Home Assistive Devices/Equipment: Raised toilet seat with rails ADL Screening (condition at time of admission) Patient's cognitive ability adequate to safely complete daily activities?: Yes Is the patient deaf or have difficulty hearing?: No Does the patient have difficulty seeing, even when wearing glasses/contacts?: No Does the patient have difficulty concentrating, remembering, or making decisions?: No Patient able to express need for  assistance with ADLs?: Yes Does the patient have difficulty dressing or bathing?: Yes Independently performs ADLs?: No Communication: Independent Dressing (OT): Needs assistance Is this a change from baseline?: Pre-admission baseline Grooming: Needs assistance Is this a change from baseline?: Pre-admission baseline Feeding: Independent Bathing: Needs assistance Is this a change from baseline?: Pre-admission baseline Toileting: Needs assistance Is this a change from baseline?: Pre-admission baseline In/Out Bed: Needs assistance Is this a change from baseline?: Pre-admission baseline Walks in Home: Independent Does the patient have difficulty walking or climbing stairs?: No Weakness of Legs: Both Weakness of Arms/Hands: None  Permission Sought/Granted                  Emotional Assessment     Affect (typically observed): Appropriate Orientation: : Oriented to Self, Oriented to Place, Oriented to  Time, Oriented to Situation Alcohol / Substance Use: Not Applicable    Admission diagnosis:  Anorexia [R63.0] Dehydration [E86.0] Esophageal dysphagia [R13.19] Warfarin-induced coagulopathy (HCC) [S01.09, T45.515A] Generalized weakness [R53.1] Dysphagia [R13.10] Esophageal obstruction due to food impaction [K22.2, T18.128A] Patient Active Problem List   Diagnosis Date Noted   Food impaction of esophagus 08/19/2021   Chronic kidney disease, stage 3a ([place]) 08/19/2021   Pneumonitis 08/19/2021   Esophageal obstruction due to food impaction 08/19/2021   Anorexia    Malnutrition of moderate degree 06/23/2021   Nausea and vomiting 06/20/2021   Generalized weakness 06/20/2021   Diarrhea 06/20/2021   Leukocytosis 06/20/2021   Hypoalbuminemia 06/20/2021  Supratherapeutic INR 06/20/2021   Seasonal allergic rhinitis 02/26/2021   Chronic kidney disease 02/17/2021   Basal cell carcinoma of face 11/15/2020   Menopausal and postmenopausal disorder 11/15/2020   Atherosclerosis of  coronary artery without angina pectoris 11/14/2020   Hypothyroidism 11/14/2020   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[place]) 11/14/2020   Nausea 11/14/2020   Osteoarthritis 11/14/2020   Vitamin D deficiency 11/14/2020   Long term (current) use of anticoagulants 10/22/2020   Pleural effusion on right 09/18/2020   Cancer of upper lobe of right lung ([place]) 09/04/2020   S/P Robotic Assisted Right Video Thoracoscopy with Right Upper Lobectomy 08/15/2020   Status post thoracotomy 08/15/2020   COPD GOLD II 07/18/2020   Mass of upper lobe of right lung 07/11/2020   Thrombocytopenia ([place]) 03/20/2019   Cerebrovascular disease 12/05/2018   Environmental and seasonal allergies 10/13/2017   Gastroesophageal reflux disease 04/13/2017   Esophageal dysphagia 04/13/2017   Chronic anticoagulation 03/10/2017   Current long-term use of postmenopausal hormone replacement therapy 03/10/2017   Non-allergic rhinitis 11/10/2016   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   Tachycardia-bradycardia syndrome ([place])    Persistent atrial fibrillation ([place]) 08/29/2014   LBBB (left bundle branch block), chronic 08/29/2014   CAD in native artery    Peripheral vascular disease ([place]) 12/13/2012   Essential hypertension 12/13/2012   Mixed hyperlipidemia 12/13/2012   Atherosclerosis of native arteries of extremity with intermittent claudication ([place]) 08/10/2011   PCP:  Celene Squibb, MD Pharmacy:   [place], [place] - [place] [address] [place] [address] Phone: [phone] Fax: [phone]     Social Determinants of Health (SDOH) Interventions    Readmission Risk Interventions    06/23/2021   10:49 AM  Readmission Risk Prevention Plan  Transportation Screening Complete  Palliative Care Screening Not Applicable  Medication Review (RN Care Manager) Complete

## 2021-08-20 NOTE — Evaluation (Signed)
Clinical/Bedside Swallow Evaluation Patient Details  Name: Katelyn [name] MRN: [id number] Date of Birth: 12-Nov-1940  Today's Date: 08/20/2021 Time: SLP Start Time (ACUTE ONLY): 1150 SLP Stop Time (ACUTE ONLY): 1214 SLP Time Calculation (min) (ACUTE ONLY): 24 min  Past Medical History:  Past Medical History:  Diagnosis Date   A-fib ([place])    Allergy    rhinitis   Aortic stenosis, severe    a. s/p TAVR 08/2014.   Arthritis    spine and various joints   Cancer of upper lobe of right lung ([place]) 09/04/2020   Carotid artery disease ([place])    Cataract    Cerebrovascular disease 12/05/2018   Childhood asthma    Chronic bronchitis ([place])        Chronic kidney disease 02/17/2021   Claudication ([place])    COPD GOLD II 07/18/2020   Coronary artery disease    a. April 2012 which revealed mid RCA occlusion with collaterals, 50% LAD stenosis, 30% circumflex stenosis, and 40% marginal stenosis   First degree AV block    GERD (gastroesophageal reflux disease)    Heart murmur    Hyperlipidemia    Hypertension    Hypothyroidism 11/14/2020   Left bundle branch block    LUMBAR RADICULOPATHY, RIGHT    PAF (paroxysmal atrial fibrillation) ([place])    Peripheral vascular disease ([place])    a. s/p aortobifem bypass graft surgery and bilateral fundoplasty by VVS.   Renal artery stenosis ([place])    a. renal artery stenosis (>60% L renal artery) by duplex 04/2020.   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 bilateral subclavian stenosis with antegrade flow by duplex 07/2020   Past Surgical History:  Past Surgical History:  Procedure Laterality Date   AORTO-FEMORAL BYPASS GRAFT Bilateral 06/27/10   BASAL CELL CARCINOMA EXCISION Left     face   CARDIAC CATHETERIZATION N/A 07/25/2014   Procedure: Right/Left Heart Cath and Coronary Angiography;  Surgeon: Troy Sine, MD;  Location: [facility];  Service: Cardiovascular;  Laterality: N/A;   CATARACT EXTRACTION, BILATERAL Bilateral 09/29/2016   COLONOSCOPY  2008   Dr. Laural Golden: normal.    DILATION AND CURETTAGE OF UTERUS  "2 or 3"   ESOPHAGEAL DILATION N/A 06/26/2021   Procedure: ESOPHAGEAL DILATION;  Surgeon: Daneil Dolin, MD;  [facility];  Service: Endoscopy;  Laterality: N/A;   ESOPHAGOGASTRODUODENOSCOPY N/A 08/18/2017   Dr. Gala Romney: Normal-appearing esophagus status post dilation.  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  Small hiatal hernia.   ESOPHAGOGASTRODUODENOSCOPY (EGD) WITH PROPOFOL N/A 06/26/2021   Procedure: ESOPHAGOGASTRODUODENOSCOPY (EGD) WITH PROPOFOL;  Surgeon: Daneil Dolin, MD;  [facility];  Service: Endoscopy;  Laterality: N/A;   INTERCOSTAL NERVE BLOCK Right 08/15/2020   Procedure: INTERCOSTAL NERVE BLOCK RIGHT;  Surgeon: [place] Nakayama, MD;  Location: [facility];  Service: Thoracic;  Laterality: Right;   IR THORACENTESIS ASP PLEURAL SPACE W/IMG GUIDE  10/07/2020   MALONEY DILATION N/A 08/18/2017   Procedure: Venia Minks DILATION;  Surgeon: Daneil Dolin, MD;  [facility];  Service: Endoscopy;  Laterality: N/A;   MULTIPLE TOOTH EXTRACTIONS     NODE DISSECTION Right 08/15/2020   Procedure:  NODE DISSECTION;  Surgeon: [place] Nakayama, MD;  Location: [facility];  Service: Thoracic;  Laterality: Right;   PORTACATH PLACEMENT Left 01/15/2021   Procedure: INSERTION PORT-A-CATH;  Surgeon: Aviva Signs, MD;  Location: [facility];  Service: General;  Laterality: Left;   PR VEIN BYPASS GRAFT,AORTO-FEM-POP  06/12/10   TEE WITHOUT CARDIOVERSION N/A 08/31/2014   Procedure: TRANSESOPHAGEAL ECHOCARDIOGRAM (TEE);  Surgeon: Rexene Alberts, MD;  Location: [facility];  Service: Open Heart Surgery;  Laterality: N/A;   TRANSCATHETER AORTIC  VALVE REPLACEMENT, TRANSFEMORAL N/A 08/31/2014   Procedure: TRANSCATHETER AORTIC VALVE REPLACEMENT, TRANSFEMORAL approach;  Surgeon: Rexene Alberts, MD;  Location: [facility];  Service: Open Heart Surgery;  Laterality: N/A;   TUBAL LIGATION  1970's   VAGINAL HYSTERECTOMY  1970's   Partial    HPI:  Pt admitted with food impaction of esophagus d/t dysphagia. PMH significant for upper lobe lung cancer s/p R upper lobectomy (08/15/20) afib, AS s/p TAVR, CAD, GERD, HLD, hypothyroidism, HTN and stroke. Pt underwent EGD yesterday. She had trouble eating a [place] sandwhich last night and BSE was ordered.    Assessment / Plan / Recommendation  Clinical Impression  Oral motor examination is WNL. Pt wears U/L dentures. Pt assessed with ice chips, thin water via cup/straw, puree, and graham crackers. Pt slowly consumed all trials withoug incident, until a few minutes after having solids (graham cracker) when she reported "bubbling" in her chest and felt regurgitation. She expectorated some frothy material. In light of essentially normal EGD yesterday, but persistant symptoms of regurgitation and seemingly adequate oropharyngeal phase, question whether Pt may have esophageal dysmotility. She indicates that her swallowing felt "normal" prior to consuming chicken wings on Sunday. She now reports early satiety and regurgitation. Consider barium pill esophagram while in acute setting. Pt appears to tolerate purees and liquids at this time, so could initiate D1/puree and thin liquids with aspiration and reflux precautions. Will message MD. SLP Visit Diagnosis: Dysphagia, unspecified (R13.10)    Aspiration Risk  Mild aspiration risk    Diet Recommendation Dysphagia 1 (Puree);Thin liquid   Liquid Administration via: Straw;Cup Medication Administration: Whole meds with liquid Supervision: Patient able to self feed;Intermittent supervision to cue for compensatory strategies Compensations: Slow rate;Small  sips/bites Postural Changes: Seated upright at 90 degrees;Remain upright for at least 30 minutes after po intake    Other  Recommendations Recommended Consults: Consider esophageal assessment Oral Care Recommendations: Oral care BID Other Recommendations: Clarify dietary restrictions    Recommendations for follow up therapy are one component of a multi-disciplinary discharge planning process, led by the attending physician.  Recommendations may be updated based on patient status, additional functional criteria and insurance authorization.  Follow up Recommendations No SLP follow up      Assistance Recommended at Discharge Intermittent Supervision/Assistance  Functional Status Assessment Patient has had a recent decline in their functional status and demonstrates the ability to make significant improvements in function in a reasonable and predictable amount of time.  Frequency and Duration min 2x/week  1 week       Prognosis Prognosis for Safe Diet Advancement: Fair Barriers to Reach Goals: Severity of deficits Barriers/Prognosis Comment: suspect esophageal component      Swallow Study   General Date of Onset: 08/19/21 HPI: Pt admitted with food impaction of esophagus d/t dysphagia. PMH significant for upper lobe lung cancer s/p R upper lobectomy (08/15/20) afib, AS s/p TAVR, CAD, GERD, HLD, hypothyroidism, HTN and stroke. Pt underwent EGD yesterday. She had trouble eating  a [place] sandwhich last night and BSE was ordered. Type of Study: Bedside Swallow Evaluation Previous Swallow Assessment: N/A Diet Prior to this Study: NPO Temperature Spikes Noted: No Respiratory Status: Nasal cannula History of Recent Intubation: No Behavior/Cognition: Alert;Cooperative;Pleasant mood Oral Cavity Assessment: Within Functional Limits Oral Care Completed by SLP: Yes Oral Cavity - Dentition: Dentures, top;Dentures, bottom Vision: Functional for self-feeding Self-Feeding Abilities: Able to feed  self Patient Positioning: Upright in bed Baseline Vocal Quality: Normal Volitional Cough: Strong Volitional Swallow: Able to elicit    Oral/Motor/Sensory Function Overall Oral Motor/Sensory Function: Within functional limits   Ice Chips Ice chips: Within functional limits Presentation: Spoon   Thin Liquid Thin Liquid: Within functional limits Presentation: Cup;Self Fed;Straw    Nectar Thick Nectar Thick Liquid: Not tested   Honey Thick Honey Thick Liquid: Not tested   Puree Puree: Within functional limits Presentation: Spoon;Self Fed   Solid     Solid: Impaired Presentation: Self Fed Pharyngeal Phase Impairments: Other (comments) (Pt with regurgitation a few minutes after having solid foods)     Thank you,  Genene Churn, [place] 08/20/2021,2:33 PM

## 2021-08-21 ENCOUNTER — Inpatient Hospital Stay (HOSPITAL_COMMUNITY): Payer: Medicare Other

## 2021-08-21 DIAGNOSIS — R630 Anorexia: Secondary | ICD-10-CM | POA: Diagnosis not present

## 2021-08-21 DIAGNOSIS — E860 Dehydration: Secondary | ICD-10-CM

## 2021-08-21 DIAGNOSIS — Z515 Encounter for palliative care: Secondary | ICD-10-CM | POA: Diagnosis not present

## 2021-08-21 DIAGNOSIS — T18128D Food in esophagus causing other injury, subsequent encounter: Secondary | ICD-10-CM | POA: Diagnosis not present

## 2021-08-21 DIAGNOSIS — R1319 Other dysphagia: Secondary | ICD-10-CM | POA: Diagnosis not present

## 2021-08-21 DIAGNOSIS — Z7189 Other specified counseling: Secondary | ICD-10-CM | POA: Diagnosis not present

## 2021-08-21 LAB — GLUCOSE, CAPILLARY
Glucose-Capillary: 107 mg/dL — ABNORMAL HIGH (ref 70–99)
Glucose-Capillary: 164 mg/dL — ABNORMAL HIGH (ref 70–99)
Glucose-Capillary: 97 mg/dL (ref 70–99)

## 2021-08-21 LAB — PROTIME-INR
INR: 3.5 — ABNORMAL HIGH (ref 0.8–1.2)
Prothrombin Time: 35.2 seconds — ABNORMAL HIGH (ref 11.4–15.2)

## 2021-08-21 MED ORDER — CHLORHEXIDINE GLUCONATE CLOTH 2 % EX PADS
6.0000 | MEDICATED_PAD | Freq: Every day | CUTANEOUS | Status: DC
  Administered 2021-08-21 – 2021-08-24 (×4): 6 via TOPICAL

## 2021-08-21 NOTE — Progress Notes (Signed)
Palliative: Katelyn [name] is sitting up in bed.  She greets me, making and mostly keeping eye contact.  She appears chronically ill and somewhat frail.  She is alert and oriented x3, able to make her needs known.  There is no family present at bedside today.  She tells me that she is doing better today.  We talked about the treatment plan for barium pill study.  She seems knowledgeable about the treatment plan and timelines.  She has no questions at this time.  We talk about her choices for palliative versus hospice care.  Katelyn [name] shares that since she is not interested in taking cancer treatments, she would prefer hospice.  Provider choice offered, hospice of [facility].  Conference with attending, bedside nursing staff, transition of care team related to patient condition, needs, goals of care, disposition.  Plan:   At this point continue to treat the treatable but no CPR or intubation.  No chemotherapy or cancer treatment.  Home with the benefits of hospice of [facility]  35 minutes  Quinn Axe, NP Palliative medicine team Team phone [phone] Greater than 50% of this time was spent counseling and coordinating care related to the above assessment and plan.

## 2021-08-21 NOTE — Progress Notes (Signed)
ANTICOAGULATION CONSULT NOTE -  Pharmacy Consult for coumadin Indication: atrial fibrillation  Allergies  Allergen Reactions   Adhesive [Tape] Rash   Latex Rash   Tetanus Toxoids Rash   Wound Dressing Adhesive Rash    Patient Measurements: Height: 5\' 10"  (177.8 cm) Weight: 61.2 kg (135 lb) IBW/kg (Calculated) : 68.5 Heparin Dosing Weight:   Vital Signs: Temp: 97.8 F (36.6 C) (06/22 0450) Temp Source: Oral (06/21 2321) BP: 161/102 (06/22 0450) Pulse Rate: 110 (06/22 0450)  Labs: Recent Labs    08/18/21 1059 08/18/21 2027 08/19/21 0454 08/19/21 1320 08/20/21 0435 08/21/21 0510  HGB 15.0  --  12.6  --  12.6  --   HCT 47.7*  --  40.5  --  41.3  --   PLT 135*  --  114*  --  107*  --   LABPROT 64.8*  --  21.3* 17.1* 25.8* 35.2*  INR 7.7*  --  1.9* 1.4* 2.4* 3.5*  CREATININE 1.08*  --  0.87  --  0.80  --   TROPONINIHS 26* 25*  --   --   --   --      Estimated Creatinine Clearance: 56 mL/min (by C-G formula based on SCr of 0.8 mg/dL).   Medical History: Past Medical History:  Diagnosis Date   A-fib [facility])    Allergy    rhinitis   Aortic stenosis, severe    a. s/p TAVR 08/2014.   Arthritis    spine and various joints   Cancer of upper lobe of right lung ([place]) 09/04/2020   Carotid artery disease ([place])    Cataract    Cerebrovascular disease 12/05/2018   Childhood asthma    Chronic bronchitis ([place])        Chronic kidney disease 02/17/2021   Claudication ([place])    COPD GOLD II 07/18/2020   Coronary artery disease    a. April 2012 which revealed mid RCA occlusion with collaterals, 50% LAD stenosis, 30% circumflex stenosis, and 40% marginal stenosis   First degree AV block    GERD (gastroesophageal reflux disease)    Heart murmur    Hyperlipidemia    Hypertension    Hypothyroidism 11/14/2020   Left bundle branch block    LUMBAR RADICULOPATHY, RIGHT    PAF (paroxysmal atrial fibrillation) ([place])    Peripheral vascular disease ([place])    a. s/p aortobifem  bypass graft surgery and bilateral fundoplasty by VVS.   Renal artery stenosis ([place])    a. renal artery stenosis (>60% L renal artery) by duplex 04/2020.   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 bilateral subclavian stenosis with antegrade flow by duplex 07/2020    Medications:  Medications Prior to Admission  Medication Sig Dispense Refill Last Dose   acetaminophen (TYLENOL) 500 MG tablet Take 500 mg by mouth every 6 (six) hours as needed for moderate pain.   UNK   albuterol (VENTOLIN HFA) 108 (90 Base) MCG/ACT inhaler Inhale 2 puffs into the lungs 2 (two) times daily as needed for wheezing or shortness of breath. 8 g 2 UNK   amiodarone (PACERONE) 200 MG tablet Take 1 tablet (200 mg total) by mouth 2 (two) times daily.  60 tablet 6 08/16/2021   estradiol (ESTRACE) 2 MG tablet Take 1 tablet (2 mg total) by mouth daily. 90 tablet 0 08/16/2021   furosemide (LASIX) 20 MG tablet Take 1 tablet (20 mg total) by mouth daily. 30 tablet 4 08/16/2021   ipratropium (ATROVENT) 0.06 % nasal spray USE 1 SPRAY(S) IN EACH NOSTRIL EVERY 6 HOURS AS NEEDED FOR RUNNY NOSE (Patient taking differently: Place 1 spray into both nostrils every 6 (six) hours as needed for rhinitis.) 15 mL 2 Past Week   irbesartan (AVAPRO) 300 MG tablet Take 0.5 tablets (150 mg total) by mouth daily. (Patient taking differently: Take 150 mg by mouth in the morning and at bedtime.) 90 tablet 0 08/16/2021   levothyroxine (SYNTHROID) 50 MCG tablet Take 1 tablet (50 mcg total) by mouth daily before breakfast. 30 tablet 3 08/16/2021   meloxicam (MOBIC) 15 MG tablet Take 15 mg by mouth daily as needed for pain.   Past Week   metoCLOPramide (REGLAN) 5 MG tablet Take 2 tablets  (10 mg total) by mouth every 8 (eight) hours as needed for nausea, vomiting or refractory nausea / vomiting. (Patient taking differently: Take 5 mg by mouth every 8 (eight) hours as needed for nausea, vomiting or refractory nausea / vomiting.) 60 tablet 3 08/16/2021   metoprolol succinate (TOPROL-XL) 25 MG 24 hr tablet Take 1 tablet by mouth once daily (Patient taking differently: Take 25 mg by mouth daily.) 90 tablet 0 08/16/2021 at pm   pantoprazole (PROTONIX) 40 MG tablet TAKE 1 TABLET BY MOUTH ONCE DAILY BEFORE SUPPER (Patient taking differently: Take 40 mg by mouth daily.) 90 tablet 3 08/16/2021   predniSONE (DELTASONE) 20 MG tablet Take 1 tablet (20 mg total) by mouth daily with breakfast. (Patient taking differently: Take 10 mg by mouth daily with breakfast.) 30 tablet 0 08/16/2021   prochlorperazine (COMPAZINE) 10 MG tablet Take 1 tablet (10 mg total) by mouth every 6 (six) hours as needed (Nausea or vomiting). 60 tablet 3 Past Week   rosuvastatin (CRESTOR) 20 MG tablet Take 1 tablet (20 mg total) by mouth at bedtime. 90 tablet 0 08/16/2021   warfarin (COUMADIN) 2.5 MG tablet Take 1 tablet (2.5 mg total) by mouth at bedtime.   08/16/2021 at 1900   lidocaine-prilocaine (EMLA) cream Apply a small amount to port a cath site and cover with plastic wrap 1 hour prior to infusion appointments 30 g 3    megestrol (MEGACE) 400 MG/10ML suspension Take 10 mLs (400 mg total) by mouth 2 (two) times daily. (Patient not taking: Reported on 08/18/2021) 480 mL 3 Not Taking   nitroGLYCERIN (NITROSTAT) 0.4 MG SL tablet Place 0.4 mg under the tongue every 5 (five) minutes as needed for chest pain.   UNK   Scheduled:   amiodarone  200 mg Oral BID   Chlorhexidine Gluconate Cloth  6 each Topical Daily   irbesartan  150 mg Oral Daily   levothyroxine  50 mcg Oral QAC breakfast   metoprolol succinate  25 mg Oral Daily   metoprolol tartrate  5 mg Intravenous Once   pantoprazole (PROTONIX) IV  40 mg Intravenous Q12H    rosuvastatin  20 mg Oral QHS   Warfarin - Pharmacist Dosing Inpatient   Does not apply q1600   Infusions:    Assessment: Pt has been on coumadin for her AF/CVA prior to admission. Her admission INR was 7.7. She presented with dysphagia. EGD today didn't show any specific cause. She was give 5mg  of vitamin  k on 6/19. Coumadin is being restarted today per protocol.   INR 1.4 > 2.4> 3.5 . warfarin ordered for 6/20 but not given due to patient unable to swallow. Unclear reason for INR increase.  Patient also on amiodarone 200 mg twice daily but has been on that prior to admission- dose recently increased in may from once daily to twice daily.  Goal of Therapy:  INR 2-3 Monitor platelets by anticoagulation protocol: Yes   Plan:  Hold warfarin x  1 dose Daily INR   Margot Ables, PharmD Clinical Pharmacist 08/21/2021 7:53 AM

## 2021-08-21 NOTE — Progress Notes (Signed)
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  The patient had a similar presentation was here was admitted to the hospital from 06/20/2021 to 06/27/2021.  She underwent EGD on 06/26/2021 which showed a normal esophagus and normal stomach.  Her esophagus was dilated.  The patient denies any fevers, chills, chest pain, shortness breath, abdominal pain, diarrhea, hematochezia, melena. She suffers from chronic dysphagia and usually has to chew her food completely.  She had numerous episodes of emesis in the emergency department.  She was given some glucagon with some relief of her symptoms.  She was able to tolerate some sips of water currently. In the ED, the patient was afebrile hemodynamically stable with oxygen saturation 90% room air.  WBC 10.4, hemoglobin 15.0, platelets 135,000.  Sodium 139, potassium 3.6, serum creatinine 1.08.  LFTs were unremarkable.  INR was 7.7.  GI was consulted to assist with management. The patient states that she normally checks her INR at home.  She has noticed her INR has been climbing on her last check.  She claims that she did notify her PCP, but continue to take her warfarin.  She has not been on any recent antibiotics nor has been any changes on her medicines.  Patient has not complained of dysphagia since her dilation in April. She continues to lose weight due to lack of appetite. She follows with Dr. Jamey Reas for her right upper lobe lung cancer and was put on megace for appetite stimulation that has been unhelpful. She has been drinking 2-3 ensure a day at home   Recent chest imaging on 6/12 with no evidence of recurrent or  metastatic disease, clearing of patchy/peribronchovascular ground-glass , large right pleural effusion, and new cirrhosis not mentioned on prior imaging.    Assessment and Plan: * Food impaction of esophagus GI consulted Plans for EGD once INR is corrected Patient was given vitamin K 5 mg IV x1 on 08/18/2021 Continue protonix  Anorexia -- dietitian consult requested -- SLP eval requested for swallow function evaluation   Esophageal obstruction due to food impaction -- Pt had EGD with Dr. Jenetta Downer on 6/20  Pneumonitis Related to Keytruda Continue prednisone  Chronic kidney disease, stage 3a ([place]) Baseline creatinine 0.8-1.1 Stable at this time   Supratherapeutic INR - INR 7.7 -Hold warfarin -INR 1.9 in AM 08/19/2021 -GI requested 1 unit FFP which was ordered in the a.m. 08/19/2021 -Warfarin has been restarted, managed by pharm D  Hypothyroidism - Continue Synthroid  Thrombocytopenia ([place]) Recent CT scan June 2023 suggest cirrhosis of the liver warfarin may also contribute Monitor for signs of bleeding  Esophageal dysphagia -- pt / family concerned about swallow function, made NPO overnight -- SLP evaluation requested.  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     Objective: Vitals:   08/20/21 1856 08/20/21 2321 08/21/21 0450 08/21/21 1241  BP: (!) 160/92 133/84 (!) 161/102 (!) 98/54  Pulse: (!) 107 85 (!) 110 73  Resp: 20 20 20 18   Temp: 97.6 F (36.4 C) 98.2 F (36.8 C) 97.8 F (36.6 C) 97.6 F (36.4 C)  TempSrc: Oral Oral  Oral  SpO2: 100% 98% 92% 96%  Weight:      Height:        Intake/Output Summary (Last 24 hours) at 08/21/2021 1709 Last data filed at 08/21/2021 0600 Gross per 24 hour  Intake 50 ml  Output 300 ml  Net -250 ml   Weight change:  Exam:  General:  Pt is alert, follows commands appropriately, not in acute distress HEENT: No icterus, No thrush, No neck mass, /AT Cardiovascular: normal S1/S2, no rubs, no gallops Respiratory: bibasilar crackles R>L Abdomen: Soft/+BS, non tender, non distended, no guarding Extremities: No edema, No lymphangitis, No petechiae, No rashes, no synovitis  Data Reviewed: I have personally reviewed following labs and imaging studies Basic Metabolic Panel: Recent Labs  Lab 08/18/21 1059 08/19/21 0454 08/20/21 0435  NA 140 139 138  K 3.5 3.6 3.6  CL 105 111 109  CO2 27 22 22   GLUCOSE 101* 67* 96  BUN 21 21 19   CREATININE 1.08* 0.87 0.80  CALCIUM 8.7* 8.1* 8.2*  MG  --  1.7 1.7   Liver Function Tests: Recent Labs  Lab 08/18/21 1059 08/19/21 0454 08/19/21 1320  AST 18 14*  --   ALT 22 17  --   ALKPHOS 77 58  --   BILITOT 1.0 1.3* 1.8*  PROT 6.5 4.9*  --   ALBUMIN 3.4* 2.6*  --    No results for input(s): "LIPASE", "AMYLASE" in the last 168 hours. No results for input(s): "AMMONIA" in the last 168 hours. Coagulation Profile: Recent Labs  Lab 08/18/21 1059 08/19/21 0454 08/19/21 1320 08/20/21 0435 08/21/21 0510  INR 7.7* 1.9* 1.4* 2.4* 3.5*   CBC: Recent  Labs  Lab 08/18/21 1059 08/19/21 0454 08/20/21 0435  WBC 10.4 8.7 8.4  NEUTROABS  --  7.5  --   HGB 15.0 12.6 12.6  HCT 47.7* 40.5 41.3  MCV 96.2 98.1 97.9  PLT 135* 114* 107*   Cardiac Enzymes: No results for input(s): "CKTOTAL", "CKMB", "CKMBINDEX", "TROPONINI" in the last 168 hours. BNP: Invalid input(s): "POCBNP" CBG: Recent Labs  Lab 08/20/21 1139 08/20/21 1657 08/21/21 0740 08/21/21 1129 08/21/21 1607  GLUCAP 139* 111* 107* 164* 97   HbA1C: No results for input(s): "HGBA1C" in the last 72 hours. Urine analysis:    Component Value Date/Time   COLORURINE YELLOW 08/18/2021 1405   APPEARANCEUR HAZY (A) 08/18/2021 1405   LABSPEC 1.018 08/18/2021 1405   PHURINE 5.0 08/18/2021 1405   GLUCOSEU NEGATIVE 08/18/2021 1405   HGBUR MODERATE (A) 08/18/2021 1405   BILIRUBINUR NEGATIVE 08/18/2021 1405   KETONESUR 5 (A) 08/18/2021 1405   PROTEINUR 30 (A) 08/18/2021 1405   UROBILINOGEN 0.2 08/29/2014 1151   NITRITE NEGATIVE 08/18/2021 1405   LEUKOCYTESUR NEGATIVE 08/18/2021 1405  Sepsis Labs:  Scheduled Meds:  amiodarone  200 mg Oral BID   Chlorhexidine Gluconate Cloth  6 each Topical Daily   irbesartan  150 mg Oral Daily   levothyroxine  50 mcg Oral QAC breakfast   metoprolol succinate  25 mg Oral Daily   pantoprazole (PROTONIX) IV  40 mg Intravenous Q12H   rosuvastatin  20 mg Oral QHS   Warfarin - Pharmacist Dosing Inpatient   Does not apply q1600   Continuous Infusions:  Procedures/Studies: DG ESOPHAGUS W SINGLE CM (SOL OR THIN BA)  Result Date: 08/21/2021 CLINICAL DATA:  Dysphagia for solids EXAM: ESOPHOGRAM/BARIUM SWALLOW TECHNIQUE: Single contrast examination was performed using thin barium. Patient swallowed a 12.5 mm diameter barium tablet as well. Patient unable to stand, worse at without assistance; exam performed LPO and RPO with head of bed elevated 25 degrees. FLUOROSCOPY: Radiation Exposure Index (as provided by the fluoroscopic device): 43.2 mGy Kerma  COMPARISON:  None Available. FINDINGS: Esophageal distention: Normal without mass or stricture Filling defects:  None 12.5 mm barium tablet: Passed from oral cavity to stomach without obstruction Motility: Markedly impaired esophageal motility with poor clearance of barium by primary and secondary waves. Intermittent tertiary waves and retrograde peristalsis noted. Mucosa:  Smooth without irregularity or ulceration Hypopharynx/cervical esophagus: Difficult to assess with patient laying down, no gross laryngeal penetration or aspiration seen. Hiatal hernia:  Absent GE reflux:  Not witnessed during exam Other:  N/A IMPRESSION: Severe esophageal dysmotility. Electronically Signed   By: Lavonia Dana M.D.   On: 08/21/2021 13:50   ECHOCARDIOGRAM COMPLETE  Result Date: 08/14/2021    ECHOCARDIOGRAM REPORT   Patient Name:   Katelyn [name] Date of Exam: 08/14/2021 Medical Rec #:  [id number]      Height:       70.0 in Accession #:    [id number]     Weight:       135.5 lb Date of Birth:  03-Dec-1940      BSA:          1.769 m Patient Age:    73 years       BP:           95/69 mmHg Patient Gender: F              HR:           113 bpm. 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 CAD, COPD, Arrythmias:Atrial Fibrillation,                 Signs/Symptoms:Murmur; Risk Factors:Hypertension and                 Dyslipidemia. Aortic stenosis. Chronic bronchitis. Cancer of                 upper lobe of right lung ([place]). Chronic anticoagulation. S/P                 TAVR (transcatheter aortic valve replacement)- 26  mm Edwards                 Sapien.                 Aortic Valve: 26 mm Sapien prosthetic, stented (TAVR)  valve is                 present in the aortic position.  Sonographer:    BERNARD WHITE RCS Referring Phys: [place]  1. Left ventricular ejection fraction, by estimation, is 50 to 55%. The left ventricle has low normal function. The left ventricle has no regional wall motion abnormalities. There is severe left ventricular hypertrophy. Left ventricular diastolic function could not be evaluated.  2. Right ventricular systolic function is normal. The right ventricular size is normal. There is normal pulmonary artery systolic pressure. The estimated right ventricular systolic pressure is 84.1 mmHg.  3. Left atrial size was moderately dilated.  4. The pericardial effusion is posterior to the left ventricle.  5. The mitral valve is grossly normal. Trivial mitral valve regurgitation.  6. The aortic valve has been repaired/replaced. Aortic valve regurgitation is not visualized. No aortic stenosis is present. There is a 26 mm Sapien prosthetic (TAVR) valve present in the aortic position. Echo findings are consistent with normal structure and function of the aortic valve prosthesis.  7. The inferior vena cava is normal in size with greater than 50% respiratory variability, suggesting right atrial pressure of 3 mmHg. Comparison(s): Changes from prior study are noted. 06/22/2019: LVEF 60-65%, TAVR with normal function. FINDINGS  Left Ventricle: Left ventricular ejection fraction, by estimation, is 50 to 55%. The left ventricle has low normal function. The left ventricle has no regional wall motion abnormalities. The left ventricular internal cavity size was normal in size. There is severe left ventricular hypertrophy. Left ventricular diastolic function could not be evaluated due to atrial fibrillation. Left ventricular diastolic function could not be evaluated. Right Ventricle: The right ventricular size is normal. No increase in right ventricular wall thickness. Right ventricular systolic function is  normal. There is normal pulmonary artery systolic pressure. The tricuspid regurgitant velocity is 2.75 m/s, and  with an assumed right atrial pressure of 3 mmHg, the estimated right ventricular systolic pressure is 66.0 mmHg. Left Atrium: Left atrial size was moderately dilated. Right Atrium: Right atrial size was normal in size. Pericardium: Trivial pericardial effusion is present. The pericardial effusion is posterior to the left ventricle. Mitral Valve: The mitral valve is grossly normal. Trivial mitral valve regurgitation. Tricuspid Valve: The tricuspid valve is grossly normal. Tricuspid valve regurgitation is mild. Aortic Valve: The aortic valve has been repaired/replaced. Aortic valve regurgitation is not visualized. No aortic stenosis is present. There is a 26 mm Sapien prosthetic, stented (TAVR) valve present in the aortic position. Echo findings are consistent with normal structure and function of the aortic valve prosthesis. Pulmonic Valve: The pulmonic valve was grossly normal. Pulmonic valve regurgitation is not visualized. Aorta: The aortic root and ascending aorta are structurally normal, with no evidence of dilitation. Venous: The inferior vena cava is normal in size with greater than 50% respiratory variability, suggesting right atrial pressure of 3 mmHg. IAS/Shunts: No atrial level shunt detected by color flow Doppler. Additional Comments: A venous catheter is visualized.  LEFT VENTRICLE PLAX 2D LVIDd:         3.40 cm LVIDs:         2.50 cm LV PW:  1.30 cm LV IVS:        1.80 cm LVOT diam:     1.50 cm LV SV:         23 LV SV Index:   13 LVOT Area:     1.77 cm  RIGHT VENTRICLE TAPSE (M-mode): 1.4 cm LEFT ATRIUM             Index        RIGHT ATRIUM           Index LA diam:        3.70 cm 2.09 cm/m   RA Area:     20.20 cm LA Vol (A2C):   90.4 ml 51.10 ml/m  RA Volume:   56.80 ml  32.11 ml/m LA Vol (A4C):   59.4 ml 33.58 ml/m LA Biplane Vol: 72.9 ml 41.21 ml/m  AORTIC VALVE LVOT Vmax:    89.67 cm/s LVOT Vmean:  59.300 cm/s LVOT VTI:    0.131 m  AORTA Ao Root diam: 3.10 cm MITRAL VALVE                TRICUSPID VALVE MV Area (PHT): 6.17 cm     TR Peak grad:   30.2 mmHg MV Decel Time: 123 msec     TR Vmax:        275.00 cm/s MV E velocity: 130.00 cm/s                             SHUNTS                             Systemic VTI:  0.13 m                             Systemic Diam: 1.50 cm Lyman Bishop MD Electronically signed by Lyman Bishop MD Signature Date/Time: 08/14/2021/5:03:38 PM    Final    CT Chest W Contrast  Result Date: 08/13/2021 CLINICAL DATA:  Non-small cell lung cancer, recently stopped chemotherapy. * Tracking Code: BO * EXAM: CT CHEST WITH CONTRAST TECHNIQUE: Multidetector CT imaging of the chest was performed during intravenous contrast administration. RADIATION DOSE REDUCTION: This exam was performed according to the departmental dose-optimization program which includes automated exposure control, adjustment of the mA and/or kV according to patient size and/or use of iterative reconstruction technique. CONTRAST:  85mL OMNIPAQUE IOHEXOL 300 MG/ML  SOLN COMPARISON:  PET 05/08/2021 and CT chest 12/16/2020. FINDINGS: Cardiovascular: Left IJ Port-A-Cath terminates in the SVC. Atherosclerotic calcification of the aorta and coronary arteries. Aortic valve replacement. Heart is enlarged. No pericardial effusion. Mediastinum/Nodes: Low internal jugular and mediastinal lymph nodes are not enlarged by CT size criteria. No hilar or axillary adenopathy. Esophagus is grossly unremarkable. Lungs/Pleura: Biapical pleuroparenchymal scarring. Centrilobular emphysema. Right upper lobectomy. Interval clearing of previously seen patchy/peribronchovascular ground-glass on 05/08/2021. Large right pleural effusion, slightly increased from 05/08/2021. Adjacent compressive atelectasis in the right lung. Clustered peribronchovascular nodularity in the lingula, unchanged and likely postinfectious in  etiology. No new pulmonary nodules. No left pleural fluid. Airway is otherwise unremarkable. Upper Abdomen: Liver margin is irregular. Visualized portions of the liver, adrenal glands, kidneys, spleen, pancreas and stomach are otherwise grossly unremarkable. Musculoskeletal: No worrisome lytic or sclerotic lesions. Right thoracotomy. IMPRESSION: 1. No evidence of recurrent or metastatic disease. 2. Interval clearing of patchy/peribronchovascular ground-glass seen on 05/08/2021, possibly due  to therapy-related cessation of treatment. 3. Large right pleural effusion, increased from slightly increased from 05/08/2021. 4. Cirrhosis. 5. Aortic atherosclerosis (ICD10-I70.0). Coronary artery calcification. 6.  Emphysema (ICD10-J43.9). Electronically Signed   By: Lorin Picket M.D.   On: 08/13/2021 08:33   VAS US CAROTID  Result Date: 08/02/2021 Carotid Arterial Duplex Study Patient Name:  Katelyn [name]  Date of Exam:   08/01/2021 Medical Rec #: [id number]       Accession #:    [id number] Date of Birth: November 15, 1940       Patient Gender: F Patient Age:   45 years Exam Location:  [facility] Procedure:      VAS US CAROTID Referring Phys: Shelva Majestic --------------------------------------------------------------------------------  Indications:       Carotid artery disease. Risk Factors:      Hypertension, hyperlipidemia, past history of smoking,                    coronary artery disease, PAD. Other Factors:     Patient denies any cerebrovascular symptoms. Comparison Study:  09/5629-SHFW 263/78 cm/s; LICA 58/85 cm/s Performing Technologist: Wilkie Aye RVT  Examination Guidelines: A complete evaluation includes B-mode imaging, spectral Doppler, color Doppler, and power Doppler as needed of all accessible portions of each vessel. Bilateral testing is considered an integral part of a complete examination. Limited examinations for reoccurring indications may be performed as noted.  Right Carotid Findings:  +----------+--------+--------+--------+--------------------------+--------+           PSV cm/sEDV cm/sStenosisPlaque Description        Comments +----------+--------+--------+--------+--------------------------+--------+ CCA Prox  78      11              heterogenous                       +----------+--------+--------+--------+--------------------------+--------+ CCA Mid   55      10      <50%    heterogenous                       +----------+--------+--------+--------+--------------------------+--------+ CCA Distal26      4               heterogenous                       +----------+--------+--------+--------+--------------------------+--------+ ICA Prox  208     55      40-59%  heterogenous and irregular         +----------+--------+--------+--------+--------------------------+--------+ ICA Mid   120     33                                                 +----------+--------+--------+--------+--------------------------+--------+ ICA Distal73      25                                                 +----------+--------+--------+--------+--------------------------+--------+ ECA       79      2               heterogenous                       +----------+--------+--------+--------+--------------------------+--------+ +----------+--------+-------+----------------+-------------------+  PSV cm/sEDV cmsDescribe        Arm Pressure (mmHG) +----------+--------+-------+----------------+-------------------+ [id number]            Multiphasic, [id number]                 +----------+--------+-------+----------------+-------------------+ +---------+--------+--+--------+-+----------------------------+ VertebralPSV cm/s20EDV cm/s4Antegrade and High resistant +---------+--------+--+--------+-+----------------------------+  Left Carotid Findings: +----------+--------+--------+--------+--------------------------+--------+           PSV cm/sEDV  cm/sStenosisPlaque Description        Comments +----------+--------+--------+--------+--------------------------+--------+ CCA Prox  32      7               heterogenous                       +----------+--------+--------+--------+--------------------------+--------+ CCA Mid   30      7       <50%    heterogenous                       +----------+--------+--------+--------+--------------------------+--------+ CCA Distal34      11              heterogenous                       +----------+--------+--------+--------+--------------------------+--------+ ICA Prox  75      15      1-39%   heterogenous and irregular         +----------+--------+--------+--------+--------------------------+--------+ ICA Mid   63      20                                                 +----------+--------+--------+--------+--------------------------+--------+ ICA Distal74      20                                                 +----------+--------+--------+--------+--------------------------+--------+ ECA       240     29      >50%    heterogenous and irregular         +----------+--------+--------+--------+--------------------------+--------+ +----------+--------+--------+----------------+-------------------+           PSV cm/sEDV cm/sDescribe        Arm Pressure (mmHG) +----------+--------+--------+----------------+-------------------+ [id number]             Multiphasic, [id number]                 +----------+--------+--------+----------------+-------------------+ +---------+--------+--+--------+-+----------------------------+ VertebralPSV cm/s26EDV cm/s6Antegrade and High resistant +---------+--------+--+--------+-+----------------------------+   Summary: Right Carotid: Velocities in the right ICA are consistent with a 40-59%                stenosis. Non-hemodynamically significant plaque <50% noted in                the CCA. Left Carotid: Velocities in the left ICA  are consistent with a 1-39% stenosis.               Non-hemodynamically significant plaque <50% noted in the CCA. The               ECA appears >50% stenosed. Vertebrals:  Bilateral vertebral arteries demonstrate antegrade flow. Bilateral              vertebral arteries demonstrate  high resistant flow. Subclavians: Normal flow hemodynamics were seen in bilateral subclavian              arteries. *See table(s) above for measurements and observations.  Electronically signed by Carlyle Dolly MD on 08/02/2021 at 3:45:09 PM.    Final     Irwin Brakeman, MD  [facility]  If 7PM-7AM, please contact night-coverage [email or web address] Password [email or web address] 08/21/2021, 5:09 PM   LOS: 2 days

## 2021-08-21 NOTE — TOC Progression Note (Signed)
Transition of Care [facility]) - Progression Note    Patient Details  Name: Katelyn [name] MRN: [id number] Date of Birth: 06/29/1940  [facility] CM/SW Contact  Ihor Gully, LCSW Phone Number: 08/21/2021, 2:17 PM  Clinical Narrative:    Per palliative care NP request, referral made to [facility].    Expected Discharge Plan: [place] Barriers to Discharge: Continued Medical Work up  Expected Discharge Plan and Services Expected Discharge Plan: [place] arrangements for the past 2 months: Single Family Home                                       Social Determinants of Health (SDOH) Interventions    Readmission Risk Interventions    06/23/2021   10:49 AM  Readmission Risk Prevention Plan  Transportation Screening Complete  Palliative Care Screening Not Applicable  Medication Review (RN Care Manager) Complete

## 2021-08-22 DIAGNOSIS — N1831 Chronic kidney disease, stage 3a: Secondary | ICD-10-CM | POA: Diagnosis not present

## 2021-08-22 DIAGNOSIS — E782 Mixed hyperlipidemia: Secondary | ICD-10-CM

## 2021-08-22 DIAGNOSIS — R630 Anorexia: Secondary | ICD-10-CM | POA: Diagnosis not present

## 2021-08-22 DIAGNOSIS — R1319 Other dysphagia: Secondary | ICD-10-CM | POA: Diagnosis not present

## 2021-08-22 DIAGNOSIS — T18128D Food in esophagus causing other injury, subsequent encounter: Secondary | ICD-10-CM | POA: Diagnosis not present

## 2021-08-22 LAB — GLUCOSE, CAPILLARY
Glucose-Capillary: 98 mg/dL (ref 70–99)
Glucose-Capillary: 108 mg/dL — ABNORMAL HIGH (ref 70–99)
Glucose-Capillary: 90 mg/dL (ref 70–99)
Glucose-Capillary: 109 mg/dL — ABNORMAL HIGH (ref 70–99)

## 2021-08-22 LAB — PROTIME-INR
INR: 3.9 — ABNORMAL HIGH (ref 0.8–1.2)
Prothrombin Time: 37.8 seconds — ABNORMAL HIGH (ref 11.4–15.2)

## 2021-08-22 MED ORDER — WARFARIN SODIUM 2 MG PO TABS: 2.0000 mg | ORAL_TABLET | Freq: Every day | ORAL | Status: DC

## 2021-08-22 MED ORDER — IRBESARTAN 300 MG PO TABS: 150.0000 mg | ORAL_TABLET | Freq: Two times a day (BID) | ORAL | Status: DC

## 2021-08-22 MED ORDER — PREDNISONE 20 MG PO TABS: 10.0000 mg | ORAL_TABLET | Freq: Every day | ORAL | Status: AC

## 2021-08-22 MED ORDER — FUROSEMIDE 20 MG PO TABS: 20.0000 mg | ORAL_TABLET | ORAL | 4 refills | Status: AC

## 2021-08-22 MED ORDER — METOCLOPRAMIDE HCL 5 MG PO TABS: 5.0000 mg | ORAL_TABLET | Freq: Three times a day (TID) | ORAL | Status: AC | PRN

## 2021-08-22 NOTE — Assessment & Plan Note (Signed)
--   reduced lasix to every other day based on poor oral intake

## 2021-08-22 NOTE — Discharge Summary (Addendum)
Physician Discharge Summary  TIEARA AREVALOS VWU:[id number] DOB: 11/12/1940 DOA: 08/18/2021  PCP: Benita Stabile, MD  Admit date: 08/18/2021 Discharge date: 08/22/2021  Admitted From:  Home  Disposition: (declined SNF)  HOME WITH HOSPICE OF [facility]  Recommendations for Outpatient Follow-up:  Follow up with PCP in 3-5 days to check PT/INR Home Hospice with [facility].   Pt to call Hospice RN first with any problems or concerns Dysphagia 2 diet with aspiration precautions Please follow up with [facility] GI clinic in 2-3 weeks  Please follow up with cardiology as scheduled  Please follow up with [facility] in 1 week for foley removal and voiding trial.   Please adjust warfarin dose as needed to keep INR therapeutic 2-3.   Home Health:  PT, RN  HOME HEALTH RN TO TEST PT/INR ON 6/26 AND 6/29 AND REPORT TO PCP/CARDIOLOGY CLINIC MANAGING WARFARIN .  HOLD WARFARIN IF INR>3  Discharge Condition: STABLE   CODE STATUS: DNR  Renette Butters Rod form completed in hospital  DIET: DYSPHAGIA 2    Brief Hospitalization Summary: Please see all hospital notes, images, labs for full details of the hospitalization. 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  The patient had a similar presentation was here was admitted to the hospital from 06/20/2021 to 06/27/2021.  She underwent EGD on 06/26/2021 which showed a normal esophagus and normal stomach.  Her esophagus was dilated.  The patient denies any fevers, chills, chest pain, shortness breath, abdominal pain, diarrhea, hematochezia, melena. She suffers from chronic dysphagia and usually has to chew her food completely.  She had numerous episodes of emesis in the emergency department.  She was given some glucagon with some relief of her  symptoms.  She was able to tolerate some sips of water currently. In the ED, the patient was afebrile hemodynamically stable with oxygen saturation 90% room air.  WBC 10.4, hemoglobin 15.0, platelets 135,000.  Sodium 139, potassium 3.6, serum creatinine 1.08.  LFTs were unremarkable.  INR was 7.7.  GI was consulted to assist with management.  The patient states that she normally checks her INR at home.  She has noticed her INR has been climbing on her last check.  She claims that she did notify her PCP, but continue to take her warfarin.  She has not been on any recent antibiotics nor has been any changes on her medicines.  Patient has not complained of dysphagia since her dilation in April. She continues to lose weight due to lack of appetite. She follows with Dr. Caren Hazy for her right upper lobe lung cancer and was put on megace for appetite stimulation that has been unhelpful. She has been drinking 2-3 ensure a day at home   Recent chest imaging on 6/12 with no evidence of recurrent or metastatic disease, clearing of patchy/peribronchovascular ground-glass , large right pleural effusion, and new cirrhosis not mentioned on prior imaging.    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   -- pt qualifies for hospice and after speaking with palliative care patient has decided to go home with hospice of [facility].   Esophageal obstruction due to food impaction -- Pt had EGD with Dr. Levon Hedger on 6/20 - see EGD report   Pneumonitis Related to Keytruda Continue prednisone per Dr.  Ellin Saba Follow up with Dr. Ellin Saba    Chronic kidney disease, stage 3a (HCC) Baseline creatinine 0.8-1.1 Stable at this time   Supratherapeutic INR - INR 3.9 -Hold warfarin -INR 3.9 in AM 08/22/2021.  Holding warfarin thru 6/26.  Restart at lower dose 2 mg.  [facility] RN requested to test PT/INR at home on 6/26, 6/29 and report results to PCP/cardiology clinic.   Hypothyroidism - Continue Synthroid  Thrombocytopenia (HCC) Recent CT scan June 2023 suggest cirrhosis of the liver warfarin may also contribute Monitor for signs of bleeding  Esophageal dysphagia -- pt / family concerned about swallow function, made NPO overnight -- SLP evaluation requested.  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 Tresa Endo  Persistent atrial fibrillation (HCC) Held warfarin temporarily due to elevated INR last 2 days up to 3.9 today. No bleeding seen.  Hold warfarin for now, restart warfarin on 6/26 at reduced dose of 2 mg daily.   HHRN requested to test PT/INR on 6/26 and 6/29 and report to PCP/cardiology clinic. Ok to restart warfarin per GI Continue amiodarone and metoprolol XL  Mixed hyperlipidemia - Continue Crestor  Essential hypertension - Continue irbesartan and beta-blocker   Discharge Diagnoses:  Principal Problem:   Food impaction of esophagus Active Problems:   Essential hypertension   Mixed hyperlipidemia   Persistent atrial fibrillation (HCC)   S/P TAVR (transcatheter aortic valve replacement)   Esophageal dysphagia   Thrombocytopenia (HCC)   Hypothyroidism   Supratherapeutic INR   Chronic kidney disease, stage 3a (HCC)   Pneumonitis   Esophageal obstruction due to food impaction   Anorexia    Dehydration   Discharge Instructions: Discharge Instructions     Ambulatory referral to Urology   Complete by: As directed       Allergies as of 08/23/2021       Reactions   Adhesive [tape] Rash   Latex Rash   Tetanus Toxoids Rash   Wound Dressing Adhesive Rash        Medication List     STOP taking these medications    megestrol 400 MG/10ML suspension Commonly known as: MEGACE   meloxicam 15 MG tablet Commonly known as: MOBIC       TAKE these medications    acetaminophen 500 MG tablet Commonly known as: TYLENOL Take 500 mg by mouth every 6 (six) hours as needed for moderate pain.   albuterol 108 (90 Base) MCG/ACT inhaler Commonly known as: VENTOLIN HFA Inhale 2 puffs into the lungs 2 (two) times daily as needed for wheezing or shortness of breath.   amiodarone 200 MG tablet Commonly known as: PACERONE Take 1 tablet (200 mg total) by mouth 2 (two) times daily.   estradiol 2 MG tablet Commonly known as: ESTRACE Take 1 tablet (2 mg total) by mouth daily.   furosemide 20 MG tablet Commonly known as: LASIX Take 1 tablet (  20 mg total) by mouth every other day. Start taking on: August 24, 2021 What changed: when to take this   ipratropium 0.06 % nasal spray Commonly known as: ATROVENT USE 1 SPRAY(S) IN EACH NOSTRIL EVERY 6 HOURS AS NEEDED FOR RUNNY NOSE What changed:  how much to take how to take this when to take this reasons to take this additional instructions   irbesartan 300 MG tablet Commonly known as: AVAPRO Take 0.5 tablets (150 mg total) by mouth in the morning and at bedtime.   levothyroxine 50 MCG tablet Commonly known as: Synthroid Take 1 tablet (50 mcg total) by mouth daily before breakfast.   lidocaine-prilocaine cream Commonly known as: EMLA Apply a small amount to port a cath site and cover with plastic wrap 1 hour prior to infusion appointments   metoCLOPramide 5 MG tablet Commonly known as: REGLAN Take 1 tablet (5 mg total)  by mouth every 8 (eight) hours as needed for nausea, vomiting or refractory nausea / vomiting.   metoprolol succinate 25 MG 24 hr tablet Commonly known as: TOPROL-XL Take 1 tablet by mouth once daily   nitroGLYCERIN 0.4 MG SL tablet Commonly known as: NITROSTAT Place 0.4 mg under the tongue every 5 (five) minutes as needed for chest pain.   pantoprazole 40 MG tablet Commonly known as: PROTONIX TAKE 1 TABLET BY MOUTH ONCE DAILY BEFORE SUPPER What changed: See the new instructions.   predniSONE 20 MG tablet Commonly known as: DELTASONE Take 0.5 tablets (10 mg total) by mouth daily with breakfast.   prochlorperazine 10 MG tablet Commonly known as: COMPAZINE Take 1 tablet (10 mg total) by mouth every 6 (six) hours as needed (Nausea or vomiting).   rosuvastatin 20 MG tablet Commonly known as: CRESTOR Take 1 tablet (20 mg total) by mouth at bedtime.   warfarin 1 MG tablet Commonly known as: COUMADIN Take as directed. If you are unsure how to take this medication, talk to your nurse or doctor. Original instructions: Take 1 tablet (1 mg total) by mouth at bedtime. Don't  take if INR>3 or take as directed by your provider Start taking on: August 26, 2021 What changed:  medication strength how much to take additional instructions These instructions start on August 26, 2021. If you are unsure what to do until then, ask your doctor or other care provider.               Durable Medical Equipment  (From admission, onward)           Start     Ordered   08/23/21 1200  For home use only DME Hospital bed  Once       Question Answer Comment  Length of Need Lifetime   Bed type Semi-electric      08/23/21 1159            Follow-up Information     Benita Stabile, MD. Schedule an appointment as soon as possible for a visit in 5 day(s).   Specialty: Internal Medicine Why: Hospital Follow Up and to check PT/INR Contact information: [address] [place] [facility]  [address] [phone]         Lennette Bihari, MD. Schedule an appointment as soon as possible for a visit in 2 week(s).   Specialty: Cardiology Contact information: [address] [place] [address] [phone]         Corbin Ade, MD. Schedule an appointment as soon as possible for a  visit in 2 week(s).   Specialty: Gastroenterology Why: Hospital Follow Up Contact information: [address] [place] [address] [phone]         [facility] Follow up.   Why: Will contact you within 48 hours for home health physical therapy and skilled nurse visit scheduling.        [facility]. Schedule an appointment as soon as possible for a visit in 1 week(s).   Specialty: Urology Why: Hospital Follow Up Foley removal and voiding trial Contact information: [address] [place] [address] [phone]               Allergies  Allergen Reactions   Adhesive [Tape] Rash   Latex Rash   Tetanus Toxoids Rash   Wound Dressing Adhesive Rash   Allergies as of 08/23/2021       Reactions   Adhesive [tape] Rash   Latex Rash   Tetanus Toxoids Rash   Wound Dressing Adhesive Rash        Medication List     STOP taking these medications    megestrol 400 MG/10ML suspension Commonly known as: MEGACE   meloxicam 15 MG tablet Commonly known as: MOBIC       TAKE these medications    acetaminophen 500 MG tablet Commonly known as: TYLENOL Take 500 mg by mouth every 6 (six) hours as needed for moderate pain.   albuterol 108 (90 Base) MCG/ACT inhaler Commonly known as: VENTOLIN HFA Inhale 2 puffs into the lungs 2 (two) times daily as needed for wheezing or shortness of breath.   amiodarone 200 MG tablet Commonly known as: PACERONE Take 1 tablet (200 mg total) by mouth 2 (two) times daily.   estradiol 2 MG tablet Commonly known as: ESTRACE Take 1 tablet (2 mg total) by mouth  daily.   furosemide 20 MG tablet Commonly known as: LASIX Take 1 tablet (20 mg total) by mouth every other day. Start taking on: August 24, 2021 What changed: when to take this   ipratropium 0.06 % nasal spray Commonly known as: ATROVENT USE 1 SPRAY(S) IN EACH NOSTRIL EVERY 6 HOURS AS NEEDED FOR RUNNY NOSE What changed:  how much to take how to take this when to take this reasons to take this additional instructions   irbesartan 300 MG tablet Commonly known as: AVAPRO Take 0.5 tablets (150 mg total) by mouth in the morning and at bedtime.   levothyroxine 50 MCG tablet Commonly known as: Synthroid Take 1 tablet (50 mcg total) by mouth daily before breakfast.   lidocaine-prilocaine cream Commonly known as: EMLA Apply a small amount to port a cath site and cover with plastic wrap 1 hour prior to infusion appointments   metoCLOPramide 5 MG tablet Commonly known as: REGLAN Take 1 tablet (5 mg total) by mouth every 8 (eight) hours as needed for nausea, vomiting or refractory nausea / vomiting.   metoprolol succinate 25 MG 24 hr tablet Commonly known as: TOPROL-XL Take 1 tablet by mouth once daily   nitroGLYCERIN 0.4 MG SL tablet Commonly known as: NITROSTAT Place 0.4 mg under the tongue every 5 (five) minutes as needed for chest pain.   pantoprazole 40 MG tablet Commonly known as: PROTONIX TAKE 1 TABLET BY MOUTH ONCE DAILY BEFORE SUPPER What changed: See the new instructions.   predniSONE 20 MG tablet Commonly known as: DELTASONE Take 0.5 tablets (10 mg total) by mouth daily with breakfast.   prochlorperazine 10 MG tablet  Commonly known as: COMPAZINE Take 1 tablet (10 mg total) by mouth every 6 (six) hours as needed (Nausea or vomiting).   rosuvastatin 20 MG tablet Commonly known as: CRESTOR Take 1 tablet (20 mg total) by mouth at bedtime.   warfarin 1 MG tablet Commonly known as: COUMADIN Take as directed. If you are unsure how to take this medication, talk to  your nurse or doctor. Original instructions: Take 1 tablet (1 mg total) by mouth at bedtime. Don't  take if INR>3 or take as directed by your provider Start taking on: August 26, 2021 What changed:  medication strength how much to take additional instructions These instructions start on August 26, 2021. If you are unsure what to do until then, ask your doctor or other care provider.               Durable Medical Equipment  (From admission, onward)           Start     Ordered   08/23/21 1200  For home use only DME Hospital bed  Once       Question Answer Comment  Length of Need Lifetime   Bed type Semi-electric      08/23/21 1159            Procedures/Studies: DG ESOPHAGUS W SINGLE CM (SOL OR THIN BA)  Result Date: 08/21/2021 CLINICAL DATA:  Dysphagia for solids EXAM: ESOPHOGRAM/BARIUM SWALLOW TECHNIQUE: Single contrast examination was performed using thin barium. Patient swallowed a 12.5 mm diameter barium tablet as well. Patient unable to stand, worse at without assistance; exam performed LPO and RPO with head of bed elevated 25 degrees. FLUOROSCOPY: Radiation Exposure Index (as provided by the fluoroscopic device): 43.2 mGy Kerma COMPARISON:  None Available. FINDINGS: Esophageal distention: Normal without mass or stricture Filling defects:  None 12.5 mm barium tablet: Passed from oral cavity to stomach without obstruction Motility: Markedly impaired esophageal motility with poor clearance of barium by primary and secondary waves. Intermittent tertiary waves and retrograde peristalsis noted. Mucosa:  Smooth without irregularity or ulceration Hypopharynx/cervical esophagus: Difficult to assess with patient laying down, no gross laryngeal penetration or aspiration seen. Hiatal hernia:  Absent GE reflux:  Not witnessed during exam Other:  N/A IMPRESSION: Severe esophageal dysmotility. Electronically Signed   By: Ulyses Southward M.D.   On: 08/21/2021 13:50   ECHOCARDIOGRAM  COMPLETE  Result Date: 08/14/2021    ECHOCARDIOGRAM REPORT   Patient Name:   Katelyn [name] Date of Exam: 08/14/2021 Medical Rec #:  [id number]      Height:       70.0 in Accession #:    [id number]     Weight:       135.5 lb Date of Birth:  1940-08-14      BSA:          1.769 m Patient Age:    80 years       BP:           95/69 mmHg Patient Gender: F              HR:           113 bpm. [facility] Procedure: 2D Echo, Cardiac Doppler and Color Doppler Indications:    I48.19 (ICD-10-CM) - Persistent atrial fibrillation (HCC)                 Z95.2 (ICD-10-CM) - S/P TAVR (transcatheter aortic valve  replacement)  History:        Patient has prior history of Echocardiogram examinations, most                 recent 06/22/2019. CAD, COPD, Arrythmias:Atrial Fibrillation,                 Signs/Symptoms:Murmur; Risk Factors:Hypertension and                 Dyslipidemia. Aortic stenosis. Chronic bronchitis. Cancer of                 upper lobe of right lung (HCC). Chronic anticoagulation. S/P                 TAVR (transcatheter aortic valve replacement)- 26 mm Edwards                 Sapien.                 Aortic Valve: 26 mm Sapien prosthetic, stented (TAVR) valve is                 present in the aortic position.  Sonographer:    BERNARD WHITE RCS Referring Phys: [id number] THOMAS A KELLY IMPRESSIONS  1. Left ventricular ejection fraction, by estimation, is 50 to 55%. The left ventricle has low normal function. The left ventricle has no regional wall motion abnormalities. There is severe left ventricular hypertrophy. Left ventricular diastolic function could not be evaluated.  2. Right ventricular systolic function is normal. The right ventricular size is normal. There is normal pulmonary artery systolic pressure. The estimated right ventricular systolic pressure is 33.2 mmHg.  3. Left atrial size was moderately dilated.  4. The pericardial effusion is posterior to the left ventricle.  5. The mitral  valve is grossly normal. Trivial mitral valve regurgitation.  6. The aortic valve has been repaired/replaced. Aortic valve regurgitation is not visualized. No aortic stenosis is present. There is a 26 mm Sapien prosthetic (TAVR) valve present in the aortic position. Echo findings are consistent with normal structure and function of the aortic valve prosthesis.  7. The inferior vena cava is normal in size with greater than 50% respiratory variability, suggesting right atrial pressure of 3 mmHg. Comparison(s): Changes from prior study are noted. 06/22/2019: LVEF 60-65%, TAVR with normal function. FINDINGS  Left Ventricle: Left ventricular ejection fraction, by estimation, is 50 to 55%. The left ventricle has low normal function. The left ventricle has no regional wall motion abnormalities. The left ventricular internal cavity size was normal in size. There is severe left ventricular hypertrophy. Left ventricular diastolic function could not be evaluated due to atrial fibrillation. Left ventricular diastolic function could not be evaluated. Right Ventricle: The right ventricular size is normal. No increase in right ventricular wall thickness. Right ventricular systolic function is normal. There is normal pulmonary artery systolic pressure. The tricuspid regurgitant velocity is 2.75 m/s, and  with an assumed right atrial pressure of 3 mmHg, the estimated right ventricular systolic pressure is 33.2 mmHg. Left Atrium: Left atrial size was moderately dilated. Right Atrium: Right atrial size was normal in size. Pericardium: Trivial pericardial effusion is present. The pericardial effusion is posterior to the left ventricle. Mitral Valve: The mitral valve is grossly normal. Trivial mitral valve regurgitation. Tricuspid Valve: The tricuspid valve is grossly normal. Tricuspid valve regurgitation is mild. Aortic Valve: The aortic valve has been repaired/replaced. Aortic valve regurgitation is not visualized. No aortic stenosis  is present. There is a  26 mm Sapien prosthetic, stented (TAVR) valve present in the aortic position. Echo findings are consistent with normal structure and function of the aortic valve prosthesis. Pulmonic Valve: The pulmonic valve was grossly normal. Pulmonic valve regurgitation is not visualized. Aorta: The aortic root and ascending aorta are structurally normal, with no evidence of dilitation. Venous: The inferior vena cava is normal in size with greater than 50% respiratory variability, suggesting right atrial pressure of 3 mmHg. IAS/Shunts: No atrial level shunt detected by color flow Doppler. Additional Comments: A venous catheter is visualized.  LEFT VENTRICLE PLAX 2D LVIDd:         3.40 cm LVIDs:         2.50 cm LV PW:         1.30 cm LV IVS:        1.80 cm LVOT diam:     1.50 cm LV SV:         23 LV SV Index:   13 LVOT Area:     1.77 cm  RIGHT VENTRICLE TAPSE (M-mode): 1.4 cm LEFT ATRIUM             Index        RIGHT ATRIUM           Index LA diam:        3.70 cm 2.09 cm/m   RA Area:     20.20 cm LA Vol (A2C):   90.4 ml 51.10 ml/m  RA Volume:   56.80 ml  32.11 ml/m LA Vol (A4C):   59.4 ml 33.58 ml/m LA Biplane Vol: 72.9 ml 41.21 ml/m  AORTIC VALVE LVOT Vmax:   89.67 cm/s LVOT Vmean:  59.300 cm/s LVOT VTI:    0.131 m  AORTA Ao Root diam: 3.10 cm MITRAL VALVE                TRICUSPID VALVE MV Area (PHT): 6.17 cm     TR Peak grad:   30.2 mmHg MV Decel Time: 123 msec     TR Vmax:        275.00 cm/s MV E velocity: 130.00 cm/s                             SHUNTS                             Systemic VTI:  0.13 m                             Systemic Diam: 1.50 cm Zoila Shutter MD Electronically signed by Zoila Shutter MD Signature Date/Time: 08/14/2021/5:03:38 PM    Final    CT Chest W Contrast  Result Date: 08/13/2021 CLINICAL DATA:  Non-small cell lung cancer, recently stopped chemotherapy. * Tracking Code: BO * EXAM: CT CHEST WITH CONTRAST TECHNIQUE: Multidetector CT imaging of the chest was performed  during intravenous contrast administration. RADIATION DOSE REDUCTION: This exam was performed according to the departmental dose-optimization program which includes automated exposure control, adjustment of the mA and/or kV according to patient size and/or use of iterative reconstruction technique. CONTRAST:  80mL OMNIPAQUE IOHEXOL 300 MG/ML  SOLN COMPARISON:  PET 05/08/2021 and CT chest 12/16/2020. FINDINGS: Cardiovascular: Left IJ Port-A-Cath terminates in the SVC. Atherosclerotic calcification of the aorta and coronary arteries. Aortic valve replacement. Heart is enlarged. No pericardial effusion. Mediastinum/Nodes: Low  internal jugular and mediastinal lymph nodes are not enlarged by CT size criteria. No hilar or axillary adenopathy. Esophagus is grossly unremarkable. Lungs/Pleura: Biapical pleuroparenchymal scarring. Centrilobular emphysema. Right upper lobectomy. Interval clearing of previously seen patchy/peribronchovascular ground-glass on 05/08/2021. Large right pleural effusion, slightly increased from 05/08/2021. Adjacent compressive atelectasis in the right lung. Clustered peribronchovascular nodularity in the lingula, unchanged and likely postinfectious in etiology. No new pulmonary nodules. No left pleural fluid. Airway is otherwise unremarkable. Upper Abdomen: Liver margin is irregular. Visualized portions of the liver, adrenal glands, kidneys, spleen, pancreas and stomach are otherwise grossly unremarkable. Musculoskeletal: No worrisome lytic or sclerotic lesions. Right thoracotomy. IMPRESSION: 1. No evidence of recurrent or metastatic disease. 2. Interval clearing of patchy/peribronchovascular ground-glass seen on 05/08/2021, possibly due to therapy-related cessation of treatment. 3. Large right pleural effusion, increased from slightly increased from 05/08/2021. 4. Cirrhosis. 5. Aortic atherosclerosis (ICD10-I70.0). Coronary artery calcification. 6.  Emphysema (ICD10-J43.9). Electronically Signed    By: Leanna Battles M.D.   On: 08/13/2021 08:33   VAS US CAROTID  Result Date: 08/02/2021 Carotid Arterial Duplex Study Patient Name:  DYANNE PARRIS Lapinski  Date of Exam:   08/01/2021 Medical Rec #: [id number]       Accession #:    [id number] Date of Birth: 1940-07-08       Patient Gender: F Patient Age:   28 years Exam Location:  [facility] Procedure:      VAS US CAROTID Referring Phys: Nicki Guadalajara --------------------------------------------------------------------------------  Indications:       Carotid artery disease. Risk Factors:      Hypertension, hyperlipidemia, past history of smoking,                    coronary artery disease, PAD. Other Factors:     Patient denies any cerebrovascular symptoms. Comparison Study:  07/2020-RICA 364/60 cm/s; LICA 90/13 cm/s Performing Technologist: Dondra Prader RVT  Examination Guidelines: A complete evaluation includes B-mode imaging, spectral Doppler, color Doppler, and power Doppler as needed of all accessible portions of each vessel. Bilateral testing is considered an integral part of a complete examination. Limited examinations for reoccurring indications may be performed as noted.  Right Carotid Findings: +----------+--------+--------+--------+--------------------------+--------+           PSV cm/sEDV cm/sStenosisPlaque Description        Comments +----------+--------+--------+--------+--------------------------+--------+ CCA Prox  78      11              heterogenous                       +----------+--------+--------+--------+--------------------------+--------+ CCA Mid   55      10      <50%    heterogenous                       +----------+--------+--------+--------+--------------------------+--------+ CCA Distal26      4               heterogenous                       +----------+--------+--------+--------+--------------------------+--------+ ICA Prox  208     55      40-59%  heterogenous and irregular          +----------+--------+--------+--------+--------------------------+--------+ ICA Mid   120     33                                                 +----------+--------+--------+--------+--------------------------+--------+  ICA Distal73      25                                                 +----------+--------+--------+--------+--------------------------+--------+ ECA       79      2               heterogenous                       +----------+--------+--------+--------+--------------------------+--------+ +----------+--------+-------+----------------+-------------------+           PSV cm/sEDV cmsDescribe        Arm Pressure (mmHG) +----------+--------+-------+----------------+-------------------+ [id number]            Multiphasic, [id number]                 +----------+--------+-------+----------------+-------------------+ +---------+--------+--+--------+-+----------------------------+ VertebralPSV cm/s20EDV cm/s4Antegrade and High resistant +---------+--------+--+--------+-+----------------------------+  Left Carotid Findings: +----------+--------+--------+--------+--------------------------+--------+           PSV cm/sEDV cm/sStenosisPlaque Description        Comments +----------+--------+--------+--------+--------------------------+--------+ CCA Prox  32      7               heterogenous                       +----------+--------+--------+--------+--------------------------+--------+ CCA Mid   30      7       <50%    heterogenous                       +----------+--------+--------+--------+--------------------------+--------+ CCA Distal34      11              heterogenous                       +----------+--------+--------+--------+--------------------------+--------+ ICA Prox  75      15      1-39%   heterogenous and irregular         +----------+--------+--------+--------+--------------------------+--------+ ICA Mid   63      20                                                  +----------+--------+--------+--------+--------------------------+--------+ ICA Distal74      20                                                 +----------+--------+--------+--------+--------------------------+--------+ ECA       240     29      >50%    heterogenous and irregular         +----------+--------+--------+--------+--------------------------+--------+ +----------+--------+--------+----------------+-------------------+           PSV cm/sEDV cm/sDescribe        Arm Pressure (mmHG) +----------+--------+--------+----------------+-------------------+ [id number]             Multiphasic, [id number]                 +----------+--------+--------+----------------+-------------------+ +---------+--------+--+--------+-+----------------------------+ VertebralPSV cm/s26EDV cm/s6Antegrade and High resistant +---------+--------+--+--------+-+----------------------------+   Summary: Right Carotid: Velocities in the right ICA are consistent with a  40-59%                stenosis. Non-hemodynamically significant plaque <50% noted in                the CCA. Left Carotid: Velocities in the left ICA are consistent with a 1-39% stenosis.               Non-hemodynamically significant plaque <50% noted in the CCA. The               ECA appears >50% stenosed. Vertebrals:  Bilateral vertebral arteries demonstrate antegrade flow. Bilateral              vertebral arteries demonstrate high resistant flow. Subclavians: Normal flow hemodynamics were seen in bilateral subclavian              arteries. *See table(s) above for measurements and observations.  Electronically signed by Dina Rich MD on 08/02/2021 at 3:45:09 PM.    Final      Subjective: Pt reports that she has decided to go home with hospice versus going to SNF.   Discharge Exam: Vitals:   08/23/21 0910 08/23/21 1350  BP: (!) 160/103 (!) 150/98  Pulse: (!) 120 (!) 109  Resp:  17  Temp:   97.9 F (36.6 C)  SpO2:  92%   Vitals:   08/22/21 2125 08/23/21 0505 08/23/21 0910 08/23/21 1350  BP: 127/89 (!) 163/95 (!) 160/103 (!) 150/98  Pulse: (!) 104 88 (!) 120 (!) 109  Resp: 20 18  17   Temp: 98.3 F (36.8 C) 98.6 F (37 C)  97.9 F (36.6 C)  TempSrc: Oral Oral  Oral  SpO2: 92% 93%  92%  Weight:      Height:       General: frail, emaciated female, awake, oriented x 3, not in acute distress Cardiovascular: RRR, S1/S2 +, no rubs, no gallops Respiratory: CTA bilaterally, no wheezing, no rhonchi Abdominal: Soft, NT, ND, bowel sounds + Extremities: no edema, no cyanosis   The results of significant diagnostics from this hospitalization (including imaging, microbiology, ancillary and laboratory) are listed below for reference.     Microbiology: No results found for this or any previous visit (from the past 240 hour(s)).   Labs: BNP (last 3 results) No results for input(s): "BNP" in the last 8760 hours. Basic Metabolic Panel: Recent Labs  Lab 08/18/21 1059 08/19/21 0454 08/20/21 0435  NA 140 139 138  K 3.5 3.6 3.6  CL 105 111 109  CO2 27 22 22   GLUCOSE 101* 67* 96  BUN 21 21 19   CREATININE 1.08* 0.87 0.80  CALCIUM 8.7* 8.1* 8.2*  MG  --  1.7 1.7   Liver Function Tests: Recent Labs  Lab 08/18/21 1059 08/19/21 0454 08/19/21 1320  AST 18 14*  --   ALT 22 17  --   ALKPHOS 77 58  --   BILITOT 1.0 1.3* 1.8*  PROT 6.5 4.9*  --   ALBUMIN 3.4* 2.6*  --    No results for input(s): "LIPASE", "AMYLASE" in the last 168 hours. No results for input(s): "AMMONIA" in the last 168 hours. CBC: Recent Labs  Lab 08/18/21 1059 08/19/21 0454 08/20/21 0435  WBC 10.4 8.7 8.4  NEUTROABS  --  7.5  --   HGB 15.0 12.6 12.6  HCT 47.7* 40.5 41.3  MCV 96.2 98.1 97.9  PLT 135* 114* 107*   Cardiac Enzymes: No results for input(s): "CKTOTAL", "CKMB", "CKMBINDEX", "TROPONINI" in the last  168 hours. BNP: Invalid input(s): "POCBNP" CBG: Recent Labs  Lab 08/22/21 1127  08/22/21 1619 08/22/21 2128 08/23/21 0738 08/23/21 1140  GLUCAP 108* 90 109* 79 101*   D-Dimer No results for input(s): "DDIMER" in the last 72 hours. Hgb A1c No results for input(s): "HGBA1C" in the last 72 hours. Lipid Profile No results for input(s): "CHOL", "HDL", "LDLCALC", "TRIG", "CHOLHDL", "LDLDIRECT" in the last 72 hours. Thyroid function studies No results for input(s): "TSH", "T4TOTAL", "T3FREE", "THYROIDAB" in the last 72 hours.  Invalid input(s): "FREET3" Anemia work up No results for input(s): "VITAMINB12", "FOLATE", "FERRITIN", "TIBC", "IRON", "RETICCTPCT" in the last 72 hours. Urinalysis    Component Value Date/Time   COLORURINE YELLOW 08/18/2021 1405   APPEARANCEUR HAZY (A) 08/18/2021 1405   LABSPEC 1.018 08/18/2021 1405   PHURINE 5.0 08/18/2021 1405   GLUCOSEU NEGATIVE 08/18/2021 1405   HGBUR MODERATE (A) 08/18/2021 1405   BILIRUBINUR NEGATIVE 08/18/2021 1405   KETONESUR 5 (A) 08/18/2021 1405   PROTEINUR 30 (A) 08/18/2021 1405   UROBILINOGEN 0.2 08/29/2014 1151   NITRITE NEGATIVE 08/18/2021 1405   LEUKOCYTESUR NEGATIVE 08/18/2021 1405   Sepsis Labs Recent Labs  Lab 08/18/21 1059 08/19/21 0454 08/20/21 0435  WBC 10.4 8.7 8.4   Microbiology No results found for this or any previous visit (from the past 240 hour(s)).  Time coordinating discharge: 43 mins  SIGNED:  Standley Dakins, MD  [facility] 08/23/2021, 1:58 PM How to contact the [facility] Attending or Consulting provider 7A - 7P or covering provider during after hours 7P -7A, for this patient?  Check the care team in [facility] and look for a) attending/consulting [facility] provider listed and b) the [facility] team listed Log into [email or web address] and use [place]'s universal password to access. If you do not have the password, please contact the hospital operator. Locate the [facility] provider you are looking for under [facility] and page to a number that you can be directly reached. If you still have  difficulty reaching the provider, please page the [facility] (Director on Call) for the Hospitalists listed on amion for assistance.

## 2021-08-22 NOTE — Progress Notes (Addendum)
Foley reinserted due to patient not being able to void and going home.  Granddaugher arrived totake patient home and she feels that there is not enough help at home to care for patient.  Contacted Dr. Laural Benes by secure chat .     Granddaughter is POA and wants to be contacted for future decisions  [facility]   [phone]

## 2021-08-23 LAB — GLUCOSE, CAPILLARY
Glucose-Capillary: 79 mg/dL (ref 70–99)
Glucose-Capillary: 101 mg/dL — ABNORMAL HIGH (ref 70–99)
Glucose-Capillary: 140 mg/dL — ABNORMAL HIGH (ref 70–99)

## 2021-08-23 LAB — PROTIME-INR
INR: 4.3 (ref 0.8–1.2)
Prothrombin Time: 41.1 seconds — ABNORMAL HIGH (ref 11.4–15.2)

## 2021-08-23 MED ORDER — WARFARIN SODIUM 1 MG PO TABS: 1.0000 mg | ORAL_TABLET | Freq: Every day | ORAL | 0 refills | Status: AC

## 2021-08-23 MED ORDER — PHYTONADIONE 5 MG PO TABS
2.5000 mg | ORAL_TABLET | Freq: Once | ORAL | Status: AC
Start: 2021-08-23 — End: 2021-08-23
  Administered 2021-08-23: 2.5 mg via ORAL
  Filled 2021-08-23: qty 1

## 2021-08-23 MED ORDER — PHYTONADIONE 1 MG/0.5 ML ORAL SOLUTION: 1.0000 mg | Freq: Once | ORAL | Status: DC

## 2021-08-23 MED ORDER — PANTOPRAZOLE SODIUM 40 MG PO TBEC
40.0000 mg | DELAYED_RELEASE_TABLET | Freq: Every day | ORAL | Status: DC
Start: 2021-08-24 — End: 2021-08-25
  Administered 2021-08-24 (×2): 40 mg via ORAL
  Filled 2021-08-23 (×2): qty 1

## 2021-08-23 NOTE — Progress Notes (Signed)
Spoke with [facility] about the hold up on the hospital bed. They state they had to redo the ticket for the hospital bed and that they will give the patient and grand daughter a call when they are going to deliver.

## 2021-08-23 NOTE — Progress Notes (Signed)
Patient's bp is 160/103 and hr 120 making patient a yellow mews. MD Laural Benes notified.

## 2021-08-24 LAB — GLUCOSE, CAPILLARY
Glucose-Capillary: 104 mg/dL — ABNORMAL HIGH (ref 70–99)
Glucose-Capillary: 107 mg/dL — ABNORMAL HIGH (ref 70–99)
Glucose-Capillary: 89 mg/dL (ref 70–99)

## 2021-08-24 LAB — PROTIME-INR
INR: 2.3 — ABNORMAL HIGH (ref 0.8–1.2)
Prothrombin Time: 25.5 seconds — ABNORMAL HIGH (ref 11.4–15.2)

## 2021-08-24 MED ORDER — WARFARIN SODIUM 1 MG PO TABS
1.0000 mg | ORAL_TABLET | Freq: Once | ORAL | Status: AC
  Administered 2021-08-24: 1 mg via ORAL
  Filled 2021-08-24: qty 1

## 2021-08-25 ENCOUNTER — Other Ambulatory Visit: Payer: Self-pay | Admitting: Cardiovascular Disease

## 2021-08-25 ENCOUNTER — Encounter (HOSPITAL_COMMUNITY): Payer: Self-pay | Admitting: Gastroenterology

## 2021-08-25 DIAGNOSIS — M1990 Unspecified osteoarthritis, unspecified site: Secondary | ICD-10-CM | POA: Diagnosis not present

## 2021-08-25 DIAGNOSIS — R531 Weakness: Secondary | ICD-10-CM | POA: Diagnosis not present

## 2021-08-25 DIAGNOSIS — I4891 Unspecified atrial fibrillation: Secondary | ICD-10-CM | POA: Diagnosis not present

## 2021-08-25 DIAGNOSIS — I639 Cerebral infarction, unspecified: Secondary | ICD-10-CM | POA: Diagnosis not present

## 2021-08-25 DIAGNOSIS — E039 Hypothyroidism, unspecified: Secondary | ICD-10-CM | POA: Diagnosis not present

## 2021-08-25 DIAGNOSIS — R1310 Dysphagia, unspecified: Secondary | ICD-10-CM | POA: Diagnosis not present

## 2021-08-25 DIAGNOSIS — C7951 Secondary malignant neoplasm of bone: Secondary | ICD-10-CM | POA: Diagnosis not present

## 2021-08-25 DIAGNOSIS — I739 Peripheral vascular disease, unspecified: Secondary | ICD-10-CM | POA: Diagnosis not present

## 2021-08-25 DIAGNOSIS — C3411 Malignant neoplasm of upper lobe, right bronchus or lung: Secondary | ICD-10-CM | POA: Diagnosis not present

## 2021-08-25 DIAGNOSIS — I519 Heart disease, unspecified: Secondary | ICD-10-CM | POA: Diagnosis not present

## 2021-08-25 DIAGNOSIS — I10 Essential (primary) hypertension: Secondary | ICD-10-CM | POA: Diagnosis not present

## 2021-08-25 DIAGNOSIS — K219 Gastro-esophageal reflux disease without esophagitis: Secondary | ICD-10-CM | POA: Diagnosis not present

## 2021-08-25 DIAGNOSIS — E785 Hyperlipidemia, unspecified: Secondary | ICD-10-CM | POA: Diagnosis not present

## 2021-08-26 DIAGNOSIS — C3411 Malignant neoplasm of upper lobe, right bronchus or lung: Secondary | ICD-10-CM | POA: Diagnosis not present

## 2021-08-26 DIAGNOSIS — C7951 Secondary malignant neoplasm of bone: Secondary | ICD-10-CM | POA: Diagnosis not present

## 2021-08-26 DIAGNOSIS — I4891 Unspecified atrial fibrillation: Secondary | ICD-10-CM | POA: Diagnosis not present

## 2021-08-26 DIAGNOSIS — K219 Gastro-esophageal reflux disease without esophagitis: Secondary | ICD-10-CM | POA: Diagnosis not present

## 2021-08-26 DIAGNOSIS — I519 Heart disease, unspecified: Secondary | ICD-10-CM | POA: Diagnosis not present

## 2021-08-26 DIAGNOSIS — E785 Hyperlipidemia, unspecified: Secondary | ICD-10-CM | POA: Diagnosis not present

## 2021-08-26 LAB — POCT INR: INR: 2.7 (ref 2.0–3.0)

## 2021-08-27 ENCOUNTER — Ambulatory Visit (INDEPENDENT_AMBULATORY_CARE_PROVIDER_SITE_OTHER): Payer: Medicare Other | Admitting: *Deleted

## 2021-08-27 DIAGNOSIS — Z7901 Long term (current) use of anticoagulants: Secondary | ICD-10-CM

## 2021-08-27 DIAGNOSIS — I4819 Other persistent atrial fibrillation: Secondary | ICD-10-CM

## 2021-08-27 NOTE — Patient Instructions (Signed)
Description   Spoke with pt and advised to continue taking the dose you have been taking, which is 1.25mg  daily (1/2 of 2.5mg  tablet). Recheck INR on Friday due to Vit K & dose change (weekly tester). [facility] [phone]

## 2021-08-28 ENCOUNTER — Encounter: Payer: Self-pay | Admitting: *Deleted

## 2021-08-28 DIAGNOSIS — E785 Hyperlipidemia, unspecified: Secondary | ICD-10-CM | POA: Diagnosis not present

## 2021-08-28 DIAGNOSIS — K219 Gastro-esophageal reflux disease without esophagitis: Secondary | ICD-10-CM | POA: Diagnosis not present

## 2021-08-28 DIAGNOSIS — I519 Heart disease, unspecified: Secondary | ICD-10-CM | POA: Diagnosis not present

## 2021-08-28 DIAGNOSIS — C7951 Secondary malignant neoplasm of bone: Secondary | ICD-10-CM | POA: Diagnosis not present

## 2021-08-28 DIAGNOSIS — C3411 Malignant neoplasm of upper lobe, right bronchus or lung: Secondary | ICD-10-CM | POA: Diagnosis not present

## 2021-08-28 DIAGNOSIS — I4891 Unspecified atrial fibrillation: Secondary | ICD-10-CM | POA: Diagnosis not present

## 2021-08-29 ENCOUNTER — Ambulatory Visit (INDEPENDENT_AMBULATORY_CARE_PROVIDER_SITE_OTHER): Payer: Medicare Other | Admitting: Cardiovascular Disease

## 2021-08-29 DIAGNOSIS — C3411 Malignant neoplasm of upper lobe, right bronchus or lung: Secondary | ICD-10-CM | POA: Diagnosis not present

## 2021-08-29 DIAGNOSIS — I4891 Unspecified atrial fibrillation: Secondary | ICD-10-CM | POA: Diagnosis not present

## 2021-08-29 DIAGNOSIS — I4819 Other persistent atrial fibrillation: Secondary | ICD-10-CM

## 2021-08-29 DIAGNOSIS — Z5181 Encounter for therapeutic drug level monitoring: Secondary | ICD-10-CM

## 2021-08-29 DIAGNOSIS — C7951 Secondary malignant neoplasm of bone: Secondary | ICD-10-CM | POA: Diagnosis not present

## 2021-08-29 DIAGNOSIS — I519 Heart disease, unspecified: Secondary | ICD-10-CM | POA: Diagnosis not present

## 2021-08-29 DIAGNOSIS — K219 Gastro-esophageal reflux disease without esophagitis: Secondary | ICD-10-CM | POA: Diagnosis not present

## 2021-08-29 DIAGNOSIS — E785 Hyperlipidemia, unspecified: Secondary | ICD-10-CM | POA: Diagnosis not present

## 2021-08-29 DIAGNOSIS — Z7901 Long term (current) use of anticoagulants: Secondary | ICD-10-CM

## 2021-08-29 LAB — POCT INR: INR: 3.5 — AB (ref 2.0–3.0)

## 2021-08-30 DIAGNOSIS — M1990 Unspecified osteoarthritis, unspecified site: Secondary | ICD-10-CM | POA: Diagnosis not present

## 2021-08-30 DIAGNOSIS — R531 Weakness: Secondary | ICD-10-CM | POA: Diagnosis not present

## 2021-08-30 DIAGNOSIS — E039 Hypothyroidism, unspecified: Secondary | ICD-10-CM | POA: Diagnosis not present

## 2021-08-30 DIAGNOSIS — E785 Hyperlipidemia, unspecified: Secondary | ICD-10-CM | POA: Diagnosis not present

## 2021-08-30 DIAGNOSIS — I639 Cerebral infarction, unspecified: Secondary | ICD-10-CM | POA: Diagnosis not present

## 2021-08-30 DIAGNOSIS — I10 Essential (primary) hypertension: Secondary | ICD-10-CM | POA: Diagnosis not present

## 2021-08-30 DIAGNOSIS — C7951 Secondary malignant neoplasm of bone: Secondary | ICD-10-CM | POA: Diagnosis not present

## 2021-08-30 DIAGNOSIS — I519 Heart disease, unspecified: Secondary | ICD-10-CM | POA: Diagnosis not present

## 2021-08-30 DIAGNOSIS — I739 Peripheral vascular disease, unspecified: Secondary | ICD-10-CM | POA: Diagnosis not present

## 2021-08-30 DIAGNOSIS — I4891 Unspecified atrial fibrillation: Secondary | ICD-10-CM | POA: Diagnosis not present

## 2021-08-30 DIAGNOSIS — K219 Gastro-esophageal reflux disease without esophagitis: Secondary | ICD-10-CM | POA: Diagnosis not present

## 2021-08-30 DIAGNOSIS — C3411 Malignant neoplasm of upper lobe, right bronchus or lung: Secondary | ICD-10-CM | POA: Diagnosis not present

## 2021-08-30 DIAGNOSIS — R1310 Dysphagia, unspecified: Secondary | ICD-10-CM | POA: Diagnosis not present

## 2021-09-01 DIAGNOSIS — I4891 Unspecified atrial fibrillation: Secondary | ICD-10-CM | POA: Diagnosis not present

## 2021-09-01 DIAGNOSIS — K219 Gastro-esophageal reflux disease without esophagitis: Secondary | ICD-10-CM | POA: Diagnosis not present

## 2021-09-01 DIAGNOSIS — C3411 Malignant neoplasm of upper lobe, right bronchus or lung: Secondary | ICD-10-CM | POA: Diagnosis not present

## 2021-09-01 DIAGNOSIS — C7951 Secondary malignant neoplasm of bone: Secondary | ICD-10-CM | POA: Diagnosis not present

## 2021-09-01 DIAGNOSIS — I519 Heart disease, unspecified: Secondary | ICD-10-CM | POA: Diagnosis not present

## 2021-09-01 DIAGNOSIS — E785 Hyperlipidemia, unspecified: Secondary | ICD-10-CM | POA: Diagnosis not present

## 2021-09-02 LAB — POCT INR: INR: 2.7 (ref 2.0–3.0)

## 2021-09-03 ENCOUNTER — Ambulatory Visit (INDEPENDENT_AMBULATORY_CARE_PROVIDER_SITE_OTHER): Payer: Medicare Other | Admitting: Internal Medicine

## 2021-09-03 DIAGNOSIS — Z5181 Encounter for therapeutic drug level monitoring: Secondary | ICD-10-CM

## 2021-09-03 NOTE — Progress Notes (Deleted)
Cardiology Clinic Note   Patient Name: Katelyn [name] Date of Encounter: 09/03/2021  Primary Care Provider:  Celene Squibb, MD Primary Cardiologist:  Shelva Majestic, MD  Patient Profile    Katelyn [name] presents to the clinic today for follow-up evaluation of her persistent atrial fibrillation, peripheral vascular disease, and aortic stenosis status post TAVR.  Past Medical History    Past Medical History:  Diagnosis Date   A-fib Katelyn [name])    Allergy    rhinitis   Aortic stenosis, severe    a. s/p TAVR 08/2014.   Arthritis    spine and various joints   Cancer of upper lobe of right lung ([place]) 09/04/2020   Carotid artery disease ([place])    Cataract    Cerebrovascular disease 12/05/2018   Childhood asthma    Chronic bronchitis ([place])        Chronic kidney disease 02/17/2021   Claudication ([place])    COPD GOLD II 07/18/2020   Coronary artery disease    a. April 2012 which revealed mid RCA occlusion with collaterals, 50% LAD stenosis, 30% circumflex stenosis, and 40% marginal stenosis   First degree AV block    GERD (gastroesophageal reflux disease)    Heart murmur    Hyperlipidemia    Hypertension    Hypothyroidism 11/14/2020   Left bundle branch block    LUMBAR RADICULOPATHY, RIGHT    PAF (paroxysmal atrial fibrillation) ([place])    Peripheral vascular disease ([place])    a. s/p aortobifem bypass graft surgery and bilateral fundoplasty by [facility].   Renal artery stenosis ([place])    a. renal artery stenosis (>60% L renal artery) by duplex 04/2020.   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 bilateral subclavian stenosis with antegrade flow by duplex 07/2020   Past Surgical History:  Procedure Laterality Date    AORTO-FEMORAL BYPASS GRAFT Bilateral 06/27/10   BASAL CELL CARCINOMA EXCISION Left    face   CARDIAC CATHETERIZATION N/A 07/25/2014   Procedure: Right/Left Heart Cath and Coronary Angiography;  Surgeon: Troy Sine, MD;  Location: [facility];  Service: Cardiovascular;  Laterality: N/A;   CATARACT EXTRACTION, BILATERAL Bilateral 09/29/2016   COLONOSCOPY  2008   Dr. Laural Golden: normal.    DILATION AND CURETTAGE OF UTERUS  "2 or 3"   ESOPHAGEAL DILATION N/A 06/26/2021   Procedure: ESOPHAGEAL DILATION;  Surgeon: Daneil Dolin, MD;  [facility];  Service: Endoscopy;  Laterality: N/A;   ESOPHAGEAL DILATION N/A 08/19/2021   Procedure: ESOPHAGEAL DILATION;  Surgeon: Harvel Quale, MD;  [facility];  Service: Gastroenterology;  Laterality: N/A;   ESOPHAGOGASTRODUODENOSCOPY N/A 08/18/2017   Dr. Gala Romney: Normal-appearing esophagus status post dilation.  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  Small hiatal hernia.   ESOPHAGOGASTRODUODENOSCOPY (EGD) WITH PROPOFOL N/A 06/26/2021   Procedure: ESOPHAGOGASTRODUODENOSCOPY (EGD) WITH PROPOFOL;  Surgeon: Daneil Dolin, MD;  [facility];  Service: Endoscopy;  Laterality: N/A;   ESOPHAGOGASTRODUODENOSCOPY (EGD) WITH PROPOFOL N/A 08/19/2021   Procedure: ESOPHAGOGASTRODUODENOSCOPY (EGD) WITH PROPOFOL;  Surgeon: Harvel Quale, MD;  [facility];  Service: Gastroenterology;  Laterality: N/A;   INTERCOSTAL NERVE BLOCK Right  08/15/2020   Procedure: INTERCOSTAL NERVE BLOCK RIGHT;  Surgeon: Melrose Nakayama, MD;  Location: [facility];  Service: Thoracic;  Laterality: Right;   IR THORACENTESIS ASP PLEURAL SPACE W/IMG GUIDE  10/07/2020   MALONEY DILATION N/A 08/18/2017   Procedure: Venia Minks DILATION;  Surgeon: Daneil Dolin, MD;  [facility];  Service: Endoscopy;  Laterality: N/A;   MULTIPLE TOOTH EXTRACTIONS     NODE DISSECTION Right 08/15/2020   Procedure: NODE DISSECTION;  Surgeon:  Melrose Nakayama, MD;  Location: [facility];  Service: Thoracic;  Laterality: Right;   PORTACATH PLACEMENT Left 01/15/2021   Procedure: INSERTION PORT-A-CATH;  Surgeon: Aviva Signs, MD;  Location: [facility];  Service: General;  Laterality: Left;   PR VEIN BYPASS GRAFT,AORTO-FEM-POP  06/12/10   TEE WITHOUT CARDIOVERSION N/A 08/31/2014   Procedure: TRANSESOPHAGEAL ECHOCARDIOGRAM (TEE);  Surgeon: Rexene Alberts, MD;  Location: [facility];  Service: Open Heart Surgery;  Laterality: N/A;   TRANSCATHETER AORTIC VALVE REPLACEMENT, TRANSFEMORAL N/A 08/31/2014   Procedure: TRANSCATHETER AORTIC VALVE REPLACEMENT, TRANSFEMORAL approach;  Surgeon: Rexene Alberts, MD;  Location: [facility];  Service: Open Heart Surgery;  Laterality: N/A;   TUBAL LIGATION  1970's   VAGINAL HYSTERECTOMY  1970's   Partial     Allergies  Allergies  Allergen Reactions   Adhesive [Tape] Rash   Latex Rash   Tetanus Toxoids Rash   Wound Dressing Adhesive Rash    History of Present Illness    Katelyn [name] has a PMH of coronary artery disease, peripheral vascular disease, persistent atrial fibrillation, aortic stenosis status post TAVR (7/16), stage III CKD, hyperlipidemia, lung CA right upper lobe (right upper lobe lobectomy with lymph node excision 6/22), and LBBB.  Post TAVR she noted increased energy and was unaware of recurrent atrial fibrillation.  She denied chest tightness and fatigue.  Her echocardiogram 8/16 showed an EF of 60 to 65% with a mean gradient of 16 and a peak gradient of 26, trivial aortic valve insufficiency.  She was seen in follow-up by Dr. Meda Coffee 2/19.  Her INR was 2.6.  She was continued on amlodipine amlodipine and furosemide as needed.  Her irbesartan was continued as well as her metoprolol and rosuvastatin.  She followed up with Dr. Claiborne Billings 2/19.  Her EKG showed atrial fibrillation.  She remained asymptomatic.  Her metoprolol was increased to 100 mg daily.  Her echocardiogram 3/19 showed an EF of 60-65% and  G2 DD.  Her TAVR was well-seated and functioning well.  She was seen in follow-up by Roby Lofts, PA-C 10/22.  During that time she remained unaware of her atrial fibrillation.  She was continued on amiodarone, beta-blocker, diltiazem, and warfarin.  She is followed by Dr. Shon Millet for her aortobifemoral bypass.  Her ABIs 3/23 were normal bilaterally. She was hospitalized 4/21-4 2823.  During that time she remained in atrial fibrillation and was on amiodarone and metoprolol for rate control.  She followed up with Dr. Claiborne Billings 07/23/2021.  During that time she was using a home monitor for her INR.  It was noted that 2 days prior her INR was supratherapeutic and she was instructed to hold her warfarin for 2 days.  She was continued on chemotherapy for her right lung CVA.  Her echocardiogram 08/14/2021 showed LVEF of 50-55% moderately dilated left atria, well-functioning TAVR.  She presents to the clinic today for follow-up evaluation states***  *** denies chest pain, shortness of breath, lower extremity edema, fatigue, palpitations, melena, hematuria, hemoptysis, diaphoresis,  weakness, presyncope, syncope, orthopnea, and PND.  Persistent atrial fibrillation-heart rate today irregularly irregular***.  Cardiac unaware.  Echocardiogram showed normal LVEF and well-functioning TAVR valve.  Reports compliance with warfarin and denies bleeding issues. Continue Coumadin, amiodarone, metoprolol, diltiazem Heart healthy low-sodium diet-salty 6 given Increase physical activity as tolerated  Status post TAVR-no increased DOE or activity intolerance.  Echocardiogram 08/14/2021 showed LVEF of 50-55% and well-functioning TAVR valve. Continue SBE prophylaxis  Essential hypertension-BP today***.  Well-controlled at home. Continue irbesartan, metoprolol, chlorthalidone, hydralazine, Heart healthy low-sodium diet-salty 6 given Increase physical activity as tolerated  Hyperlipidemia-LDL*** Continue  rosuvastatin Heart healthy low-sodium high-fiber diet Increase physical activity as tolerated  Disposition: Follow-up with Dr. Claiborne Billings in 6 months.  Home Medications    Prior to Admission medications   Medication Sig Start Date End Date Taking? Authorizing Provider  acetaminophen (TYLENOL) 500 MG tablet Take 500 mg by mouth every 6 (six) hours as needed for moderate pain.    [provider]  albuterol (VENTOLIN HFA) 108 (90 Base) MCG/ACT inhaler Inhale 2 puffs into the lungs 2 (two) times daily as needed for wheezing or shortness of breath. 06/12/21   Derek Jack, MD  amiodarone (PACERONE) 200 MG tablet Take 1 tablet (200 mg total) by mouth 2 (two) times daily. 07/23/21   Troy Sine, MD  estradiol (ESTRACE) 2 MG tablet Take 1 tablet (2 mg total) by mouth daily. 10/23/20   Ailene Ards, NP  furosemide (LASIX) 20 MG tablet Take 1 tablet (20 mg total) by mouth every other day. 08/24/21   Johnson, Clanford L, MD  ipratropium (ATROVENT) 0.06 % nasal spray USE 1 SPRAY(S) IN EACH NOSTRIL EVERY 6 HOURS AS NEEDED FOR RUNNY NOSE Patient taking differently: Place 1 spray into both nostrils every 6 (six) hours as needed for rhinitis. 10/22/20   Ailene Ards, NP  irbesartan (AVAPRO) 300 MG tablet Take 0.5 tablets (150 mg total) by mouth 2 (two) times daily. 08/25/21   Troy Sine, MD  levothyroxine (SYNTHROID) 50 MCG tablet Take 1 tablet (50 mcg total) by mouth daily before breakfast. 04/22/21   Derek Jack, MD  lidocaine-prilocaine (EMLA) cream Apply a small amount to port a cath site and cover with plastic wrap 1 hour prior to infusion appointments 12/16/20   Derek Jack, MD  metoCLOPramide (REGLAN) 5 MG tablet Take 1 tablet (5 mg total) by mouth every 8 (eight) hours as needed for nausea, vomiting or refractory nausea / vomiting. 08/22/21   Wynetta Emery, Clanford L, MD  metoprolol succinate (TOPROL-XL) 25 MG 24 hr tablet Take 1 tablet by mouth once daily Patient taking  differently: Take 25 mg by mouth daily. 04/18/21   Troy Sine, MD  nitroGLYCERIN (NITROSTAT) 0.4 MG SL tablet Place 0.4 mg under the tongue every 5 (five) minutes as needed for chest pain. 04/04/20   [provider]  pantoprazole (PROTONIX) 40 MG tablet TAKE 1 TABLET BY MOUTH ONCE DAILY BEFORE SUPPER Patient taking differently: Take 40 mg by mouth daily. 07/16/20   Erenest Rasher, PA-C  predniSONE (DELTASONE) 20 MG tablet Take 0.5 tablets (10 mg total) by mouth daily with breakfast. 08/22/21   Murlean Iba, MD  prochlorperazine (COMPAZINE) 10 MG tablet Take 1 tablet (10 mg total) by mouth every 6 (six) hours as needed (Nausea or vomiting). 08/04/21   Derek Jack, MD  rosuvastatin (CRESTOR) 20 MG tablet Take 1 tablet (20 mg total) by mouth at bedtime. 10/23/20   Ailene Ards, NP  warfarin (COUMADIN) 1 MG tablet Take 1 tablet (1 mg total) by mouth at bedtime. Don't  take if INR>3 or take as directed by your provider 08/26/21   Murlean Iba, MD    Family History    Family History  Problem Relation Age of Onset   Heart disease Mother 93       died young   Varicose Veins Mother    Stroke Father    Hypertension Father    Heart disease Father 49       Aneurysm   Heart attack Father    Arthritis Brother    Stroke Brother    Diabetes Daughter    Arthritis Daughter    Heart attack Brother        unsure   Allergic rhinitis Neg Hx    Angioedema Neg Hx    Asthma Neg Hx    Atopy Neg Hx    Eczema Neg Hx    Immunodeficiency Neg Hx    Urticaria Neg Hx    She indicated that her mother is deceased. She indicated that her father is deceased. She indicated that both of her brothers are deceased. She indicated that her daughter is alive. She indicated that the status of her neg hx is unknown.  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 Retired,used to work at [employer].Lives alone.         Patient has one daughter, 3 grands   Likes to crochet   Works in [employer].    Social Determinants of Health   Financial Resource Strain: Low Risk  (03/10/2017)   Overall Financial Resource Strain (CARDIA)    Difficulty of Paying Living Expenses: Not very hard  Food Insecurity: No Food Insecurity (12/17/2020)   Hunger Vital Sign    Worried About Running Out of Food in the Last Year: Never true    Ran Out of Food in the Last Year: Never true  Transportation Needs: No Transportation Needs (12/17/2020)   PRAPARE - Hydrologist (Medical): No    Lack of Transportation (Non-Medical): No  Physical Activity: Inactive (03/10/2017)   Exercise Vital Sign    Days of Exercise per Week: 0 days    Minutes of Exercise per Session: 0 min  Stress: No Stress Concern Present (03/10/2017)   [place]    Feeling of Stress : Only a little  Social Connections: Unknown (03/10/2017)   Social Connection and Isolation Panel [NHANES]    Frequency of Communication with Friends and Family:  More than three times a week    Frequency of Social Gatherings with Friends and Family: More than three times a week    Attends Religious Services: Patient refused    Active Member of  Clubs or Organizations: Patient refused    Attends Archivist Meetings: Never    Marital Status: Married  Human resources officer Violence: Not At Risk (03/10/2017)   Humiliation, Afraid, Rape, and Kick questionnaire    Fear of Current or Ex-Partner: No    Emotionally Abused: No    Physically Abused: No    Sexually Abused: No     Review of Systems    General:  No chills, fever, night sweats or weight changes.  Cardiovascular:  No chest pain, dyspnea on exertion, edema, orthopnea, palpitations, paroxysmal nocturnal dyspnea. Dermatological: No rash, lesions/masses Respiratory: No cough, dyspnea Urologic: No hematuria, dysuria Abdominal:   No nausea, vomiting, diarrhea, bright red blood per rectum, melena, or hematemesis Neurologic:  No visual changes, wkns, changes in mental status. All other systems reviewed and are otherwise negative except as noted above.  Physical Exam    VS:  There were no vitals taken for this visit. , BMI There is no height or weight on file to calculate BMI. GEN: Well nourished, well developed, in no acute distress. HEENT: normal. Neck: Supple, no JVD, carotid bruits, or masses. Cardiac: RRR, no murmurs, rubs, or gallops. No clubbing, cyanosis, edema.  Radials/DP/PT 2+ and equal bilaterally.  Respiratory:  Respirations regular and unlabored, clear to auscultation bilaterally. GI: Soft, nontender, nondistended, BS + x 4. MS: no deformity or atrophy. Skin: warm and dry, no rash. Neuro:  Strength and sensation are intact. Psych: Normal affect.  Accessory Clinical Findings    Recent Labs: 08/19/2021: ALT 17; TSH 3.309 08/20/2021: BUN 19; Creatinine, Ser 0.80; Hemoglobin 12.6; Magnesium 1.7; Platelets 107; Potassium 3.6; Sodium 138   Recent Lipid Panel    Component Value Date/Time   CHOL 133 12/14/2019 1317   CHOL 126 11/21/2018 1146   TRIG 105 12/14/2019 1317   HDL 57 12/14/2019 1317   HDL 51 11/21/2018 1146   CHOLHDL 2.3 12/14/2019 1317   VLDL 40  (H) 01/16/2015 1028   LDLCALC 57 12/14/2019 1317    ECG personally reviewed by me today- *** - No acute changes  Echocardiogram 08/14/2021  IMPRESSIONS     1. Left ventricular ejection fraction, by estimation, is 50 to 55%. The  left ventricle has low normal function. The left ventricle has no regional  wall motion abnormalities. There is severe left ventricular hypertrophy.  Left ventricular diastolic  function could not be evaluated.   2. Right ventricular systolic function is normal. The right ventricular  size is normal. There is normal pulmonary artery systolic pressure. The  estimated right ventricular systolic pressure is 36.6 mmHg.   3. Left atrial size was moderately dilated.   4. The pericardial effusion is posterior to the left ventricle.   5. The mitral valve is grossly normal. Trivial mitral valve  regurgitation.   6. The aortic valve has been repaired/replaced. Aortic valve  regurgitation is not visualized. No aortic stenosis is present. There is a  26 mm Sapien prosthetic (TAVR) valve present in the aortic position. Echo  findings are consistent with normal  structure and function of the aortic valve prosthesis.   7. The inferior vena cava is normal in size with greater than 50%  respiratory variability, suggesting right atrial pressure of 3 mmHg.   Comparison(s): Changes from prior  study are noted. 06/22/2019: LVEF 60-65%,  TAVR with normal function.  Assessment & Plan   1.  ***   Jossie Ng. Trinitee Horgan NP-C     09/03/2021, 1:16 PM [place] [address] Office [phone] Fax [phone]  Notice: This dictation was prepared with Dragon dictation along with smaller phrase technology. Any transcriptional errors that result from this process are unintentional and may not be corrected upon review.  I spent***minutes examining this patient, reviewing medications, and using patient centered shared decision making involving  her cardiac care.  Prior to her visit I spent greater than 20 minutes reviewing her past medical history,  medications, and prior cardiac tests.

## 2021-09-03 NOTE — Patient Instructions (Signed)
Description   Called and spoke to pt and instructed her to hold warfarin today Then continue to take 1/2 a tablet of warfarin daily. Recheck INR on 7/10 [facility] [phone].

## 2021-09-04 DIAGNOSIS — I519 Heart disease, unspecified: Secondary | ICD-10-CM | POA: Diagnosis not present

## 2021-09-04 DIAGNOSIS — C7951 Secondary malignant neoplasm of bone: Secondary | ICD-10-CM | POA: Diagnosis not present

## 2021-09-04 DIAGNOSIS — E785 Hyperlipidemia, unspecified: Secondary | ICD-10-CM | POA: Diagnosis not present

## 2021-09-04 DIAGNOSIS — C3411 Malignant neoplasm of upper lobe, right bronchus or lung: Secondary | ICD-10-CM | POA: Diagnosis not present

## 2021-09-04 DIAGNOSIS — K219 Gastro-esophageal reflux disease without esophagitis: Secondary | ICD-10-CM | POA: Diagnosis not present

## 2021-09-04 DIAGNOSIS — I4891 Unspecified atrial fibrillation: Secondary | ICD-10-CM | POA: Diagnosis not present

## 2021-09-05 ENCOUNTER — Ambulatory Visit (INDEPENDENT_AMBULATORY_CARE_PROVIDER_SITE_OTHER): Payer: Medicare Other | Admitting: Internal Medicine

## 2021-09-05 ENCOUNTER — Ambulatory Visit: Payer: Medicare Other | Admitting: General Practice

## 2021-09-05 DIAGNOSIS — Z952 Presence of prosthetic heart valve: Secondary | ICD-10-CM | POA: Diagnosis not present

## 2021-09-05 DIAGNOSIS — Z5181 Encounter for therapeutic drug level monitoring: Secondary | ICD-10-CM | POA: Diagnosis not present

## 2021-09-05 LAB — POCT INR: INR: 3.1 — AB (ref 2.0–3.0)

## 2021-09-05 NOTE — Patient Instructions (Signed)
Description   Called and spoke to pt and instructed her to hold warfarin today Then continue to take 1/2 a tablet of warfarin daily. Recheck INR on 7/10 [facility] [phone].   Pt is on bactrim and should be finished by 7/10

## 2021-09-08 ENCOUNTER — Ambulatory Visit (INDEPENDENT_AMBULATORY_CARE_PROVIDER_SITE_OTHER): Payer: Medicare Other

## 2021-09-08 DIAGNOSIS — Z952 Presence of prosthetic heart valve: Secondary | ICD-10-CM | POA: Diagnosis not present

## 2021-09-08 DIAGNOSIS — I4819 Other persistent atrial fibrillation: Secondary | ICD-10-CM

## 2021-09-08 DIAGNOSIS — Z7901 Long term (current) use of anticoagulants: Secondary | ICD-10-CM

## 2021-09-08 LAB — POCT INR: INR: 2.9 (ref 2.0–3.0)

## 2021-09-08 NOTE — Patient Instructions (Signed)
Description   Called and spoke to pt and instructed continue to take 1/2 a tablet of warfarin daily. Recheck INR in 1 week. [facility] [phone].  Bactrim finished on 7/10

## 2021-09-09 DIAGNOSIS — C7951 Secondary malignant neoplasm of bone: Secondary | ICD-10-CM | POA: Diagnosis not present

## 2021-09-09 DIAGNOSIS — E785 Hyperlipidemia, unspecified: Secondary | ICD-10-CM | POA: Diagnosis not present

## 2021-09-09 DIAGNOSIS — C3411 Malignant neoplasm of upper lobe, right bronchus or lung: Secondary | ICD-10-CM | POA: Diagnosis not present

## 2021-09-09 DIAGNOSIS — I519 Heart disease, unspecified: Secondary | ICD-10-CM | POA: Diagnosis not present

## 2021-09-09 DIAGNOSIS — I4891 Unspecified atrial fibrillation: Secondary | ICD-10-CM | POA: Diagnosis not present

## 2021-09-09 DIAGNOSIS — K219 Gastro-esophageal reflux disease without esophagitis: Secondary | ICD-10-CM | POA: Diagnosis not present

## 2021-09-10 DIAGNOSIS — E785 Hyperlipidemia, unspecified: Secondary | ICD-10-CM | POA: Diagnosis not present

## 2021-09-10 DIAGNOSIS — C7951 Secondary malignant neoplasm of bone: Secondary | ICD-10-CM | POA: Diagnosis not present

## 2021-09-10 DIAGNOSIS — K219 Gastro-esophageal reflux disease without esophagitis: Secondary | ICD-10-CM | POA: Diagnosis not present

## 2021-09-10 DIAGNOSIS — I4891 Unspecified atrial fibrillation: Secondary | ICD-10-CM | POA: Diagnosis not present

## 2021-09-10 DIAGNOSIS — C3411 Malignant neoplasm of upper lobe, right bronchus or lung: Secondary | ICD-10-CM | POA: Diagnosis not present

## 2021-09-10 DIAGNOSIS — I519 Heart disease, unspecified: Secondary | ICD-10-CM | POA: Diagnosis not present

## 2021-09-11 DIAGNOSIS — K219 Gastro-esophageal reflux disease without esophagitis: Secondary | ICD-10-CM | POA: Diagnosis not present

## 2021-09-11 DIAGNOSIS — E785 Hyperlipidemia, unspecified: Secondary | ICD-10-CM | POA: Diagnosis not present

## 2021-09-11 DIAGNOSIS — C3411 Malignant neoplasm of upper lobe, right bronchus or lung: Secondary | ICD-10-CM | POA: Diagnosis not present

## 2021-09-11 DIAGNOSIS — C7951 Secondary malignant neoplasm of bone: Secondary | ICD-10-CM | POA: Diagnosis not present

## 2021-09-11 DIAGNOSIS — I4891 Unspecified atrial fibrillation: Secondary | ICD-10-CM | POA: Diagnosis not present

## 2021-09-11 DIAGNOSIS — I519 Heart disease, unspecified: Secondary | ICD-10-CM | POA: Diagnosis not present

## 2021-09-12 ENCOUNTER — Encounter: Payer: Self-pay | Admitting: General Practice

## 2021-09-13 DIAGNOSIS — I519 Heart disease, unspecified: Secondary | ICD-10-CM | POA: Diagnosis not present

## 2021-09-13 DIAGNOSIS — I4891 Unspecified atrial fibrillation: Secondary | ICD-10-CM | POA: Diagnosis not present

## 2021-09-13 DIAGNOSIS — E785 Hyperlipidemia, unspecified: Secondary | ICD-10-CM | POA: Diagnosis not present

## 2021-09-13 DIAGNOSIS — C3411 Malignant neoplasm of upper lobe, right bronchus or lung: Secondary | ICD-10-CM | POA: Diagnosis not present

## 2021-09-13 DIAGNOSIS — K219 Gastro-esophageal reflux disease without esophagitis: Secondary | ICD-10-CM | POA: Diagnosis not present

## 2021-09-13 DIAGNOSIS — C7951 Secondary malignant neoplasm of bone: Secondary | ICD-10-CM | POA: Diagnosis not present

## 2021-09-15 ENCOUNTER — Ambulatory Visit (INDEPENDENT_AMBULATORY_CARE_PROVIDER_SITE_OTHER): Payer: Medicare Other

## 2021-09-15 DIAGNOSIS — I4819 Other persistent atrial fibrillation: Secondary | ICD-10-CM

## 2021-09-15 DIAGNOSIS — Z7901 Long term (current) use of anticoagulants: Secondary | ICD-10-CM

## 2021-09-15 LAB — POCT INR: INR: 2.1 (ref 2.0–3.0)

## 2021-09-15 NOTE — Patient Instructions (Signed)
Description   Called and spoke to pt and instructed continue to take 1/2 a tablet of warfarin daily. Recheck INR in 1 week. [facility] [phone].

## 2021-09-16 ENCOUNTER — Inpatient Hospital Stay: Payer: Medicare Other | Admitting: Gastroenterology

## 2021-09-16 DIAGNOSIS — C7951 Secondary malignant neoplasm of bone: Secondary | ICD-10-CM | POA: Diagnosis not present

## 2021-09-16 DIAGNOSIS — C3411 Malignant neoplasm of upper lobe, right bronchus or lung: Secondary | ICD-10-CM | POA: Diagnosis not present

## 2021-09-16 DIAGNOSIS — K219 Gastro-esophageal reflux disease without esophagitis: Secondary | ICD-10-CM | POA: Diagnosis not present

## 2021-09-16 DIAGNOSIS — I519 Heart disease, unspecified: Secondary | ICD-10-CM | POA: Diagnosis not present

## 2021-09-16 DIAGNOSIS — E785 Hyperlipidemia, unspecified: Secondary | ICD-10-CM | POA: Diagnosis not present

## 2021-09-16 DIAGNOSIS — I4891 Unspecified atrial fibrillation: Secondary | ICD-10-CM | POA: Diagnosis not present

## 2021-09-17 DIAGNOSIS — C7951 Secondary malignant neoplasm of bone: Secondary | ICD-10-CM | POA: Diagnosis not present

## 2021-09-17 DIAGNOSIS — I519 Heart disease, unspecified: Secondary | ICD-10-CM | POA: Diagnosis not present

## 2021-09-17 DIAGNOSIS — I4891 Unspecified atrial fibrillation: Secondary | ICD-10-CM | POA: Diagnosis not present

## 2021-09-17 DIAGNOSIS — K219 Gastro-esophageal reflux disease without esophagitis: Secondary | ICD-10-CM | POA: Diagnosis not present

## 2021-09-17 DIAGNOSIS — E785 Hyperlipidemia, unspecified: Secondary | ICD-10-CM | POA: Diagnosis not present

## 2021-09-17 DIAGNOSIS — C3411 Malignant neoplasm of upper lobe, right bronchus or lung: Secondary | ICD-10-CM | POA: Diagnosis not present

## 2021-09-23 ENCOUNTER — Ambulatory Visit: Payer: Medicare Other | Admitting: Thoracic Surgery (Cardiothoracic Vascular Surgery)

## 2021-09-25 ENCOUNTER — Other Ambulatory Visit (HOSPITAL_COMMUNITY): Payer: Medicare Other

## 2021-09-25 ENCOUNTER — Ambulatory Visit (HOSPITAL_COMMUNITY): Payer: Medicare Other | Admitting: Hematology

## 2021-09-30 DEATH — deceased

## 2022-10-13 IMAGING — DX DG CHEST 2V
2 series · 2 of 2 positions shown · non-contrast
Comparison: Chest x-ray 03/18/2021, PET-CT 05/08/2021

CLINICAL DATA: 80-year-old female with cough and shortness of
breath

EXAM:
CHEST - 2 VIEW

[chest lat]
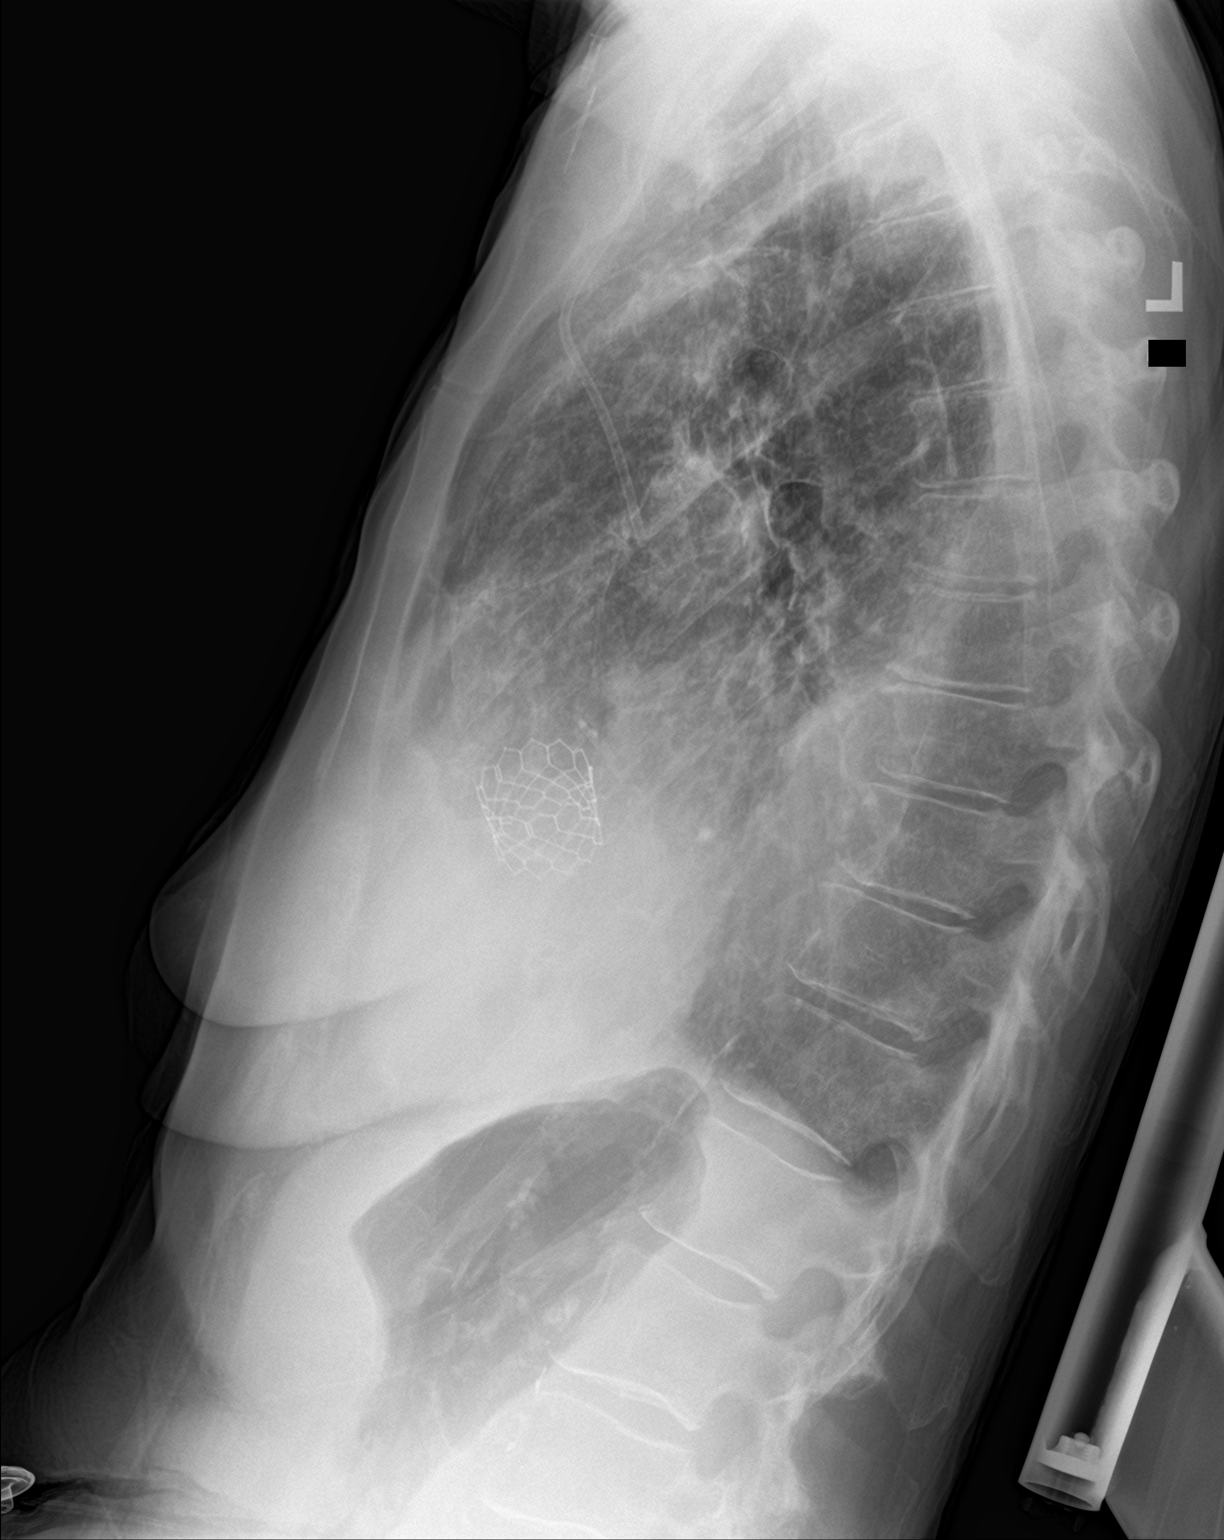

[chest ap]
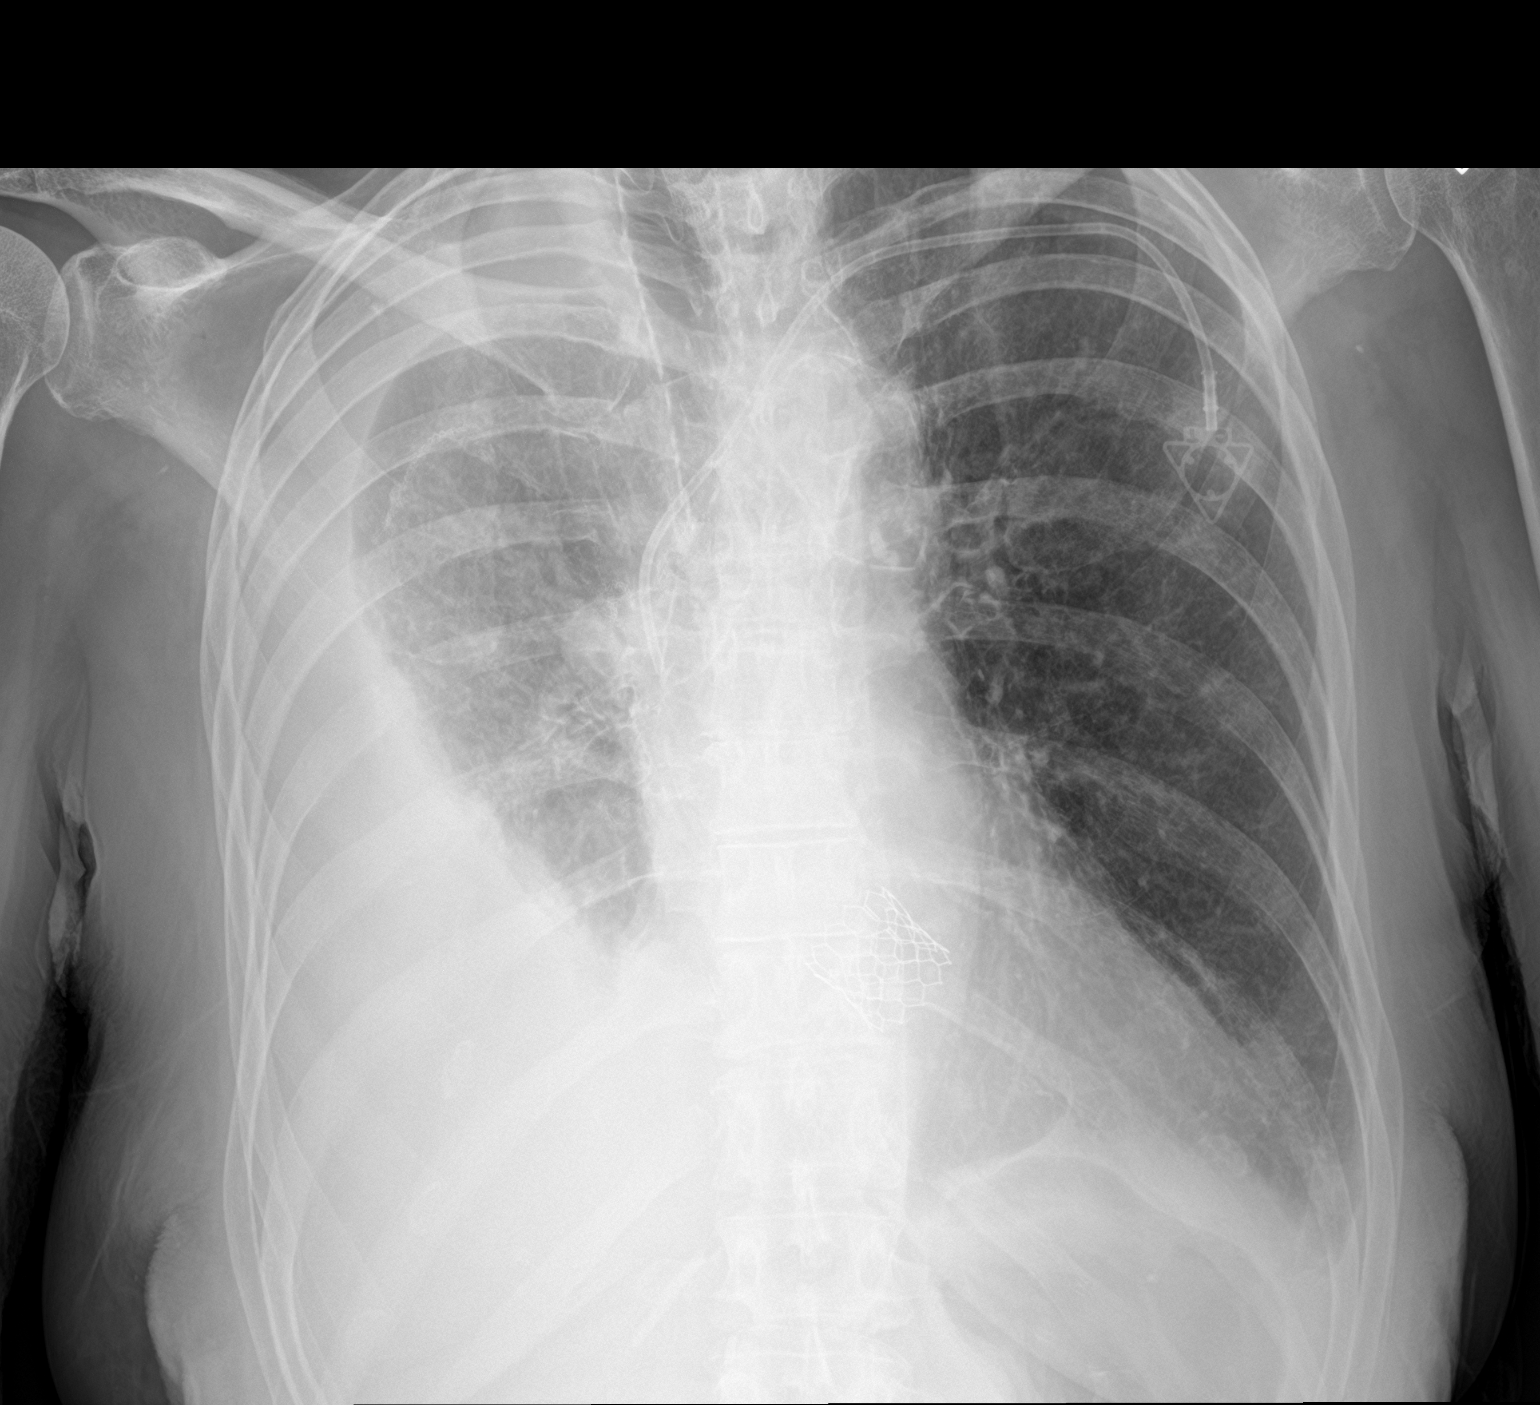

[2 of 2 positions shown; findings below may reference images not displayed]

FINDINGS: Cardiomediastinal silhouette unchanged in size and contour. Surgical
changes of prior TAVR. Unchanged left subclavian port catheter.

Increasing opacity at the periphery of the right lung with
pleuroparenchymal thickening, opacification of the base of the lung
with obscuration the right hemidiaphragm and the right heart border.
Interval resolution of the previous gas component of the
hydropneumothorax.

Surgical changes at the apex of the right lung.

Aeration of the left lung is relatively similar to the chest x-ray
of 03/18/2021.

Trace blunting at the left costophrenic angle and sulcus on the
lateral view.
IMPRESSION: Increasing right-sided pleural fluid/pleural disease, with
decreasing aeration of the right lung secondary to
atelectasis/consolidation. Progressing pleural malignancy not
excluded.

Left lung relatively well aerated, without evidence of lobar
pneumonia. Trace left-sided pleural effusion.

## 2022-10-21 IMAGING — DX DG CHEST 1V PORT
1 series · 1 of 1 positions shown · non-contrast
Comparison: June 12, 2021.

CLINICAL DATA: Recent pneumonia in a 80-year-old female.

EXAM:
PORTABLE CHEST 1 VIEW

[chest ap]
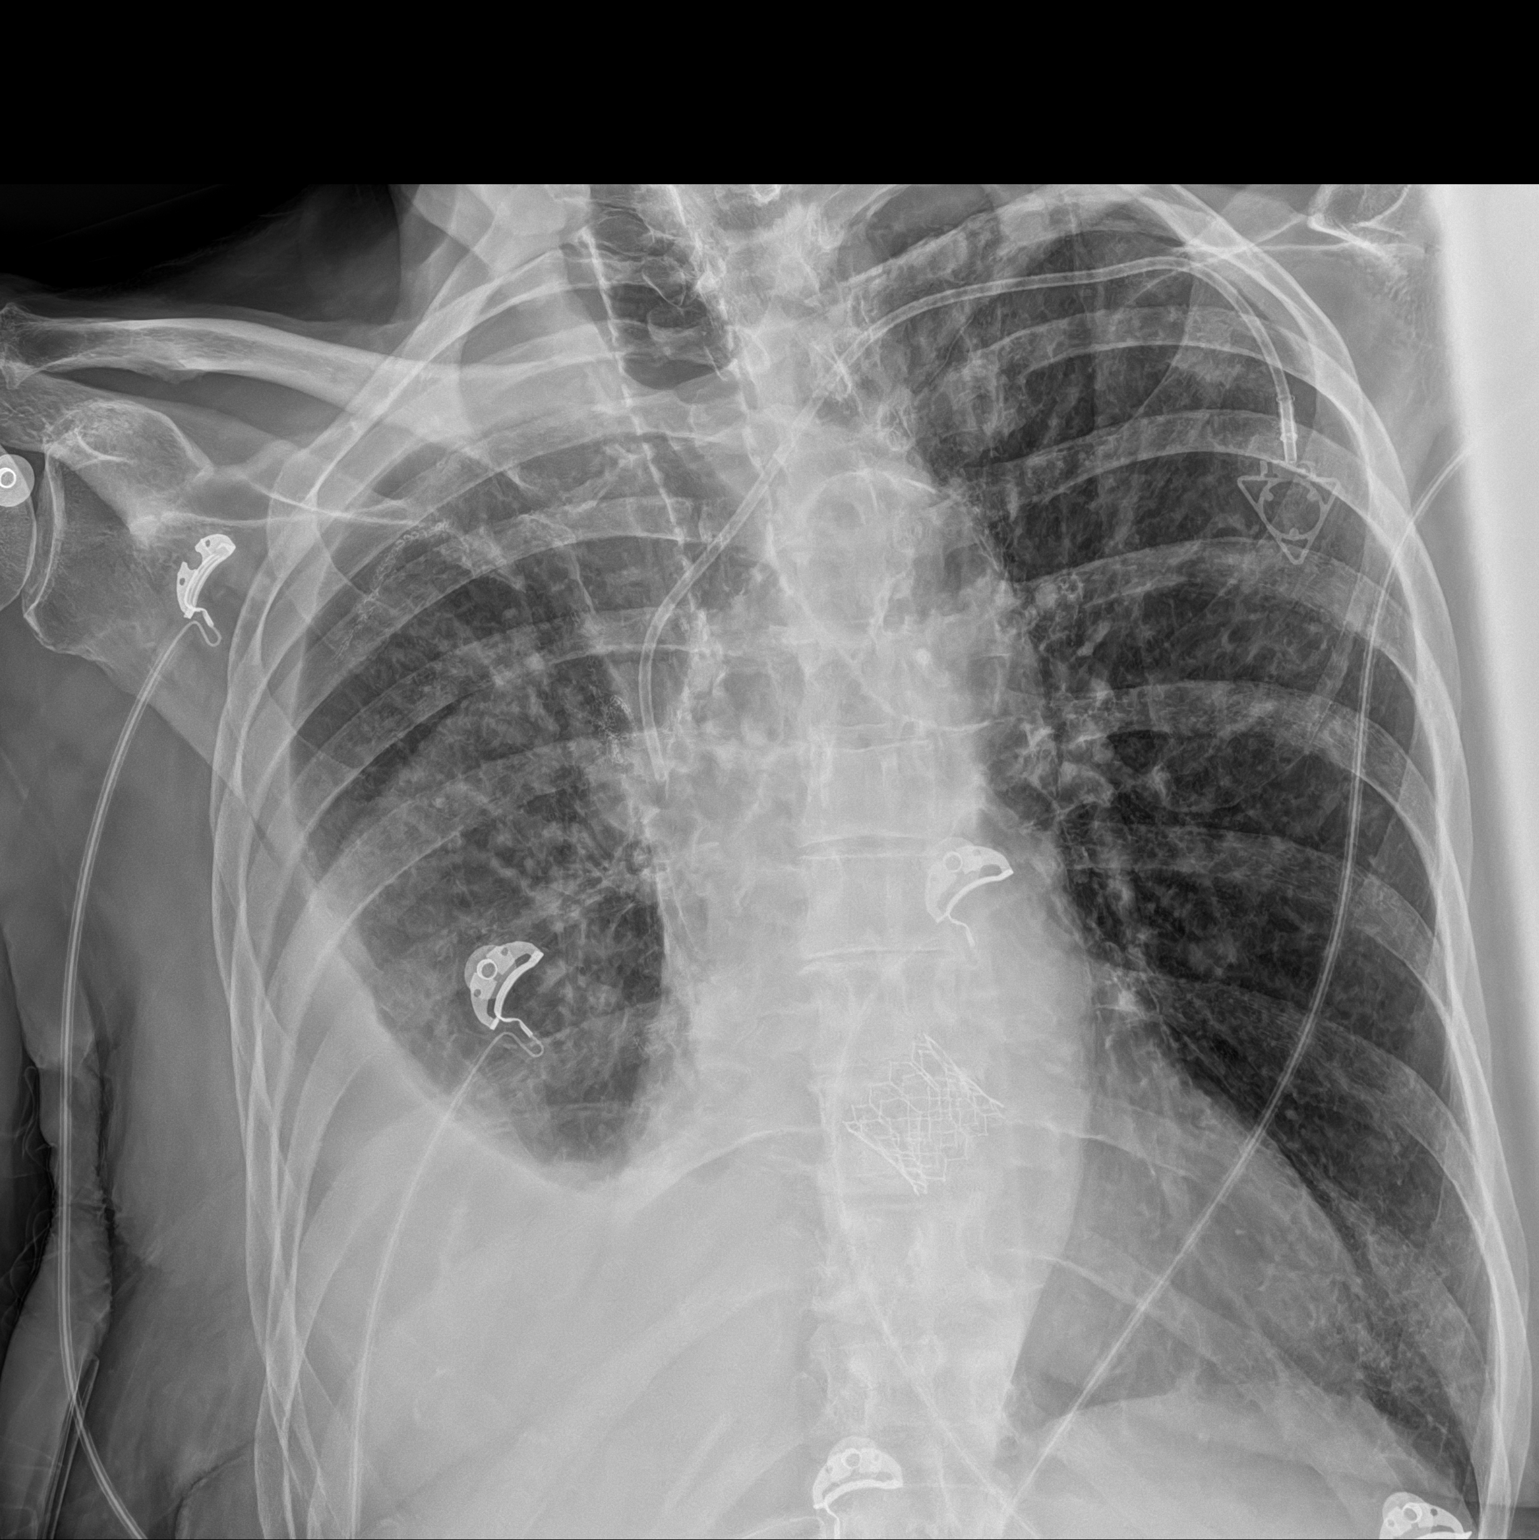

[1 of 1 positions shown; findings below may reference images not displayed]

FINDINGS: LEFT Port-A-Cath terminates at the distal aspect of the superior
vena cava as before.

Signs of TAVR and aortic atherosclerosis with stable
cardiomediastinal contours.

Moderate to large RIGHT-sided effusion is diminished in size.

Postoperative changes with suture line in the RIGHT upper lobe.

Associated airspace disease at the RIGHT lung base is improved
though still with considerable opacification and obscured RIGHT
hemidiaphragm. The LEFT chest is clear.

On limited assessment there is no acute skeletal finding with EKG
leads projecting over the patient's chest.
IMPRESSION: 1. Diminished size of RIGHT-sided effusion and associated airspace
disease at the RIGHT lung base.
2. No new abnormalities.

## 2022-12-22 IMAGING — RF DG ESOPHAGUS
9 series · 14 of 24 positions shown · non-contrast
Comparison: None Available.

CLINICAL DATA: Dysphagia for solids

EXAM:
ESOPHOGRAM/BARIUM SWALLOW
TECHNIQUE: Single contrast examination was performed using thin barium. Patient
swallowed a 12.5 mm diameter barium tablet as well. Patient unable
to stand, worse at without assistance; exam performed LPO and RPO
with head of bed elevated 25 degrees.
FLUOROSCOPY:
Radiation Exposure Index (as provided by the fluoroscopic device):
43.2 mGy Kerma

[Series 1: cp_standard · 0.17mm/px · 2 of 91 frames shown (1 of 9)]
[frame 14/91]
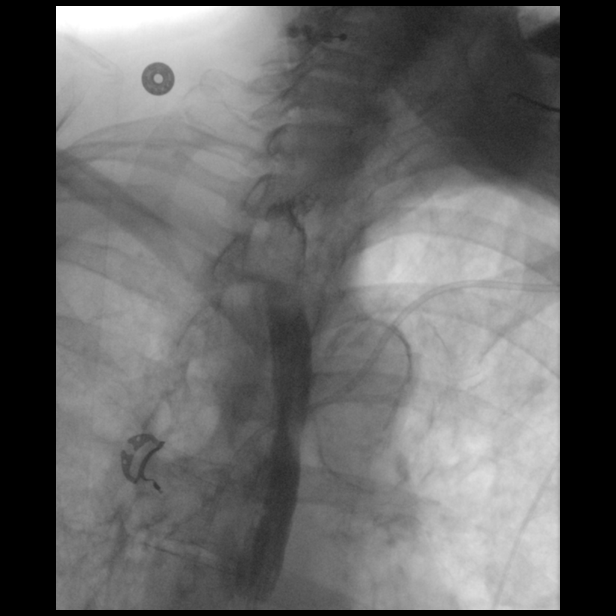
[frame 78/91]
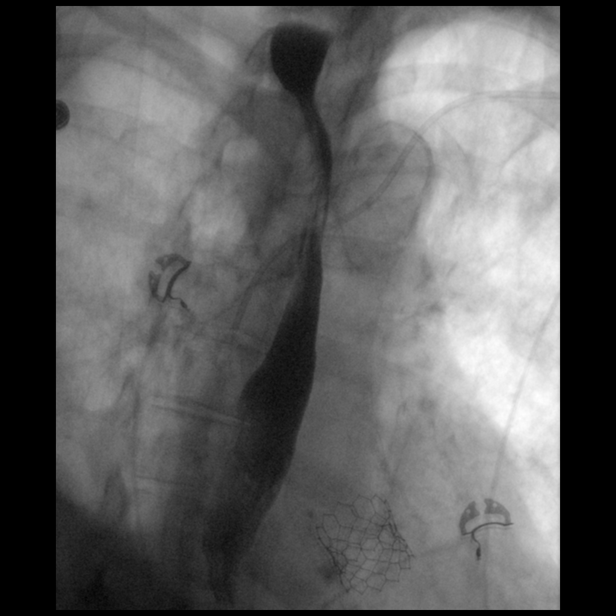

[Series 2: cp_standard · 0.18mm/px · 1 of 80 frames shown (2 of 9)]
[frame 41/80]
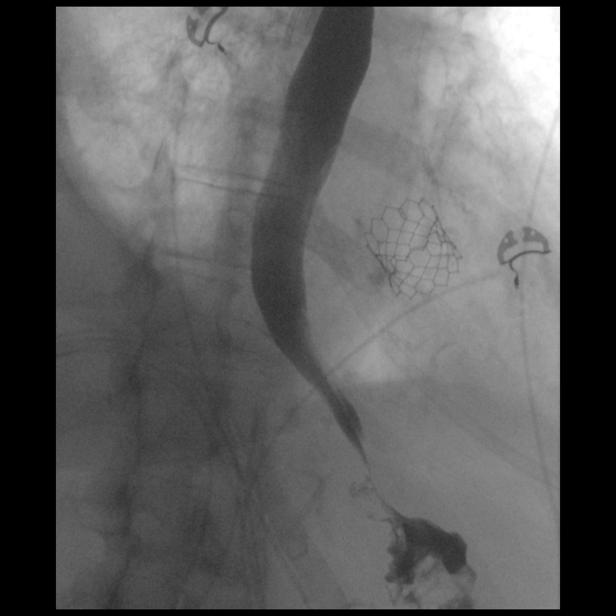

[Series 3: cp_standard · 0.18mm/px · 2 of 116 frames shown (3 of 9)]
[frame 18/116]
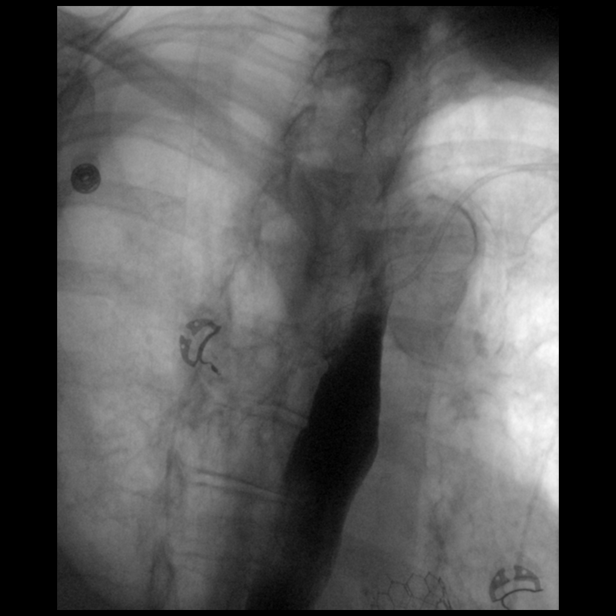
[frame 28/116]
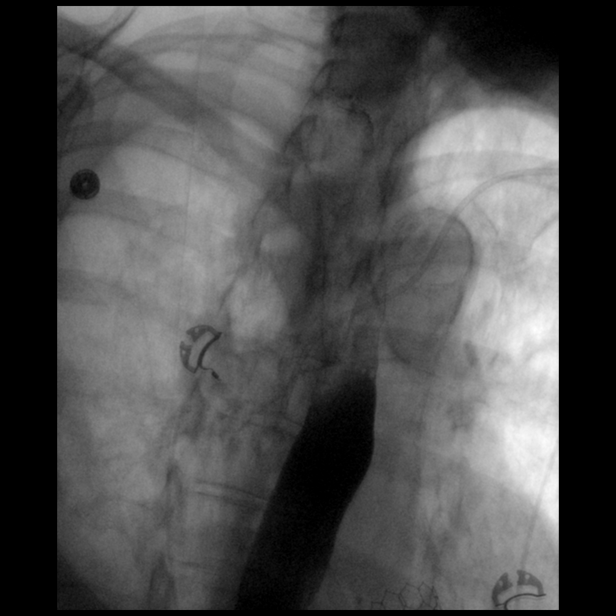

[Series 4: cp_standard · 0.17mm/px · 2 of 218 frames shown (4 of 9)]
[frame 33/218]
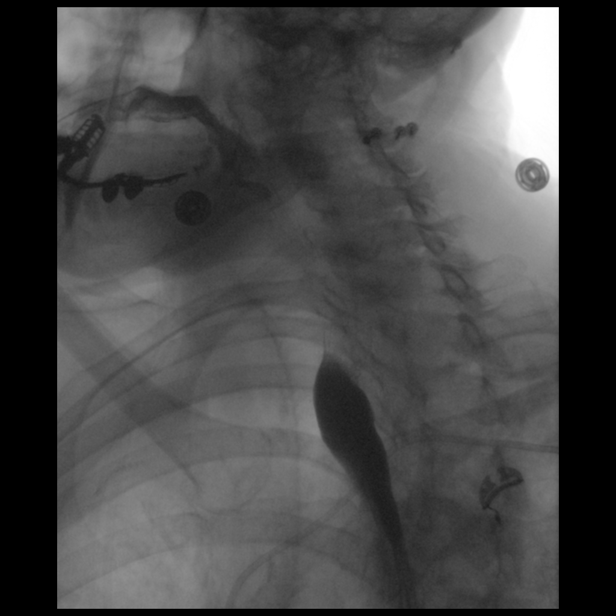
[frame 186/218]
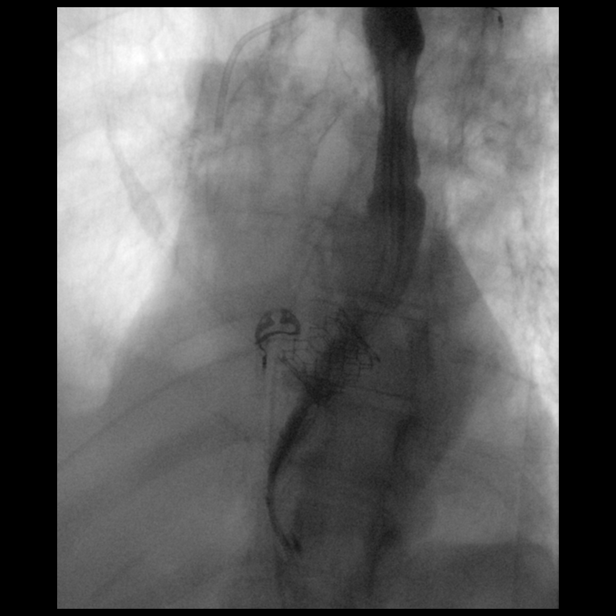

[Series 5: cp_standard · 0.18mm/px · 2 of 146 frames shown (5 of 9)]
[frame 22/146]
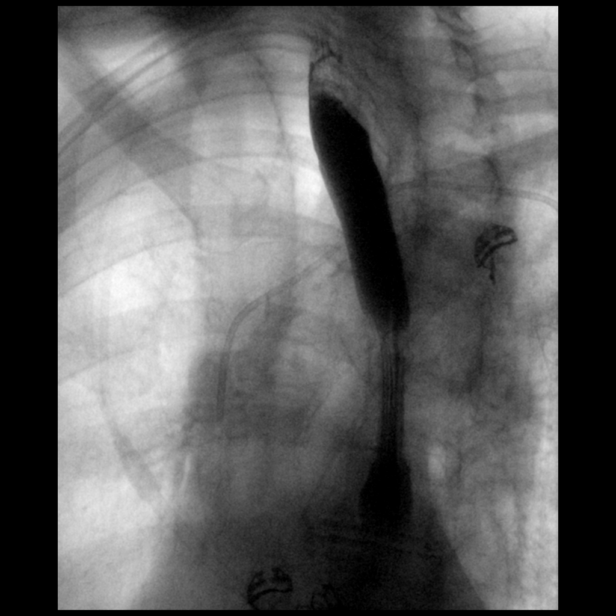
[frame 125/146]
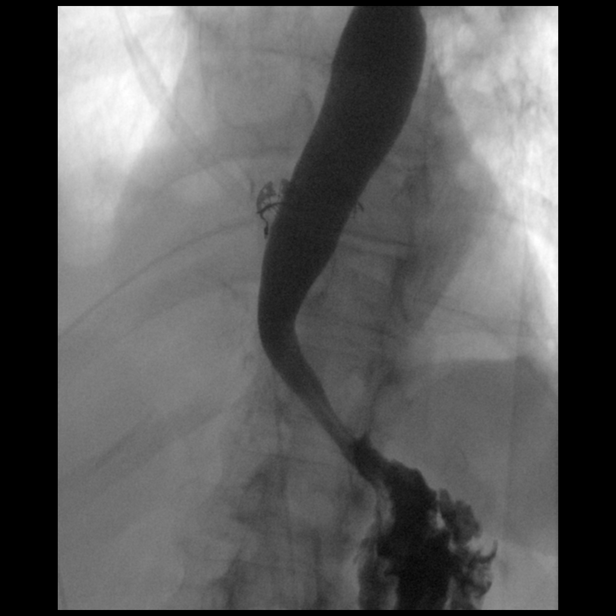

[Series 6: cp_standard · 0.18mm/px · 1 of 248 frames shown (6 of 9)]
[frame 162/248]
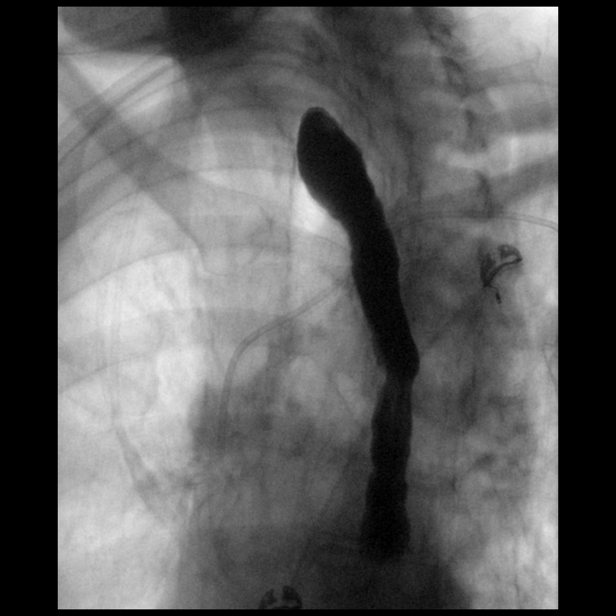

[Series 7: cp_standard · 0.18mm/px · 2 of 64 frames shown (7 of 9)]
[frame 3/64]
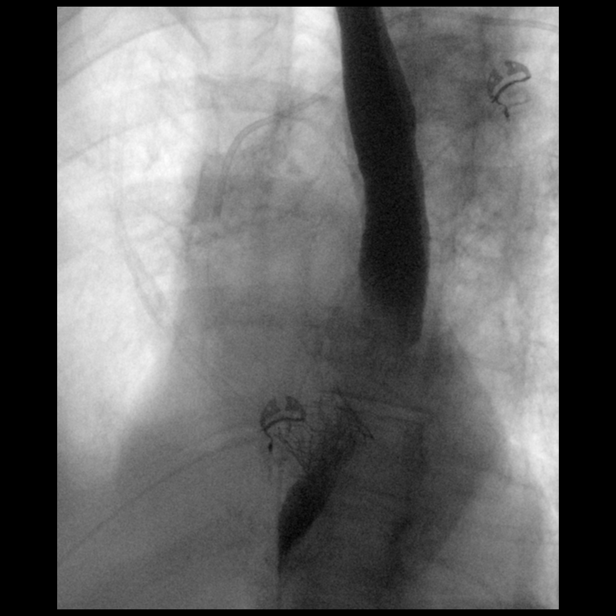
[frame 33/64]
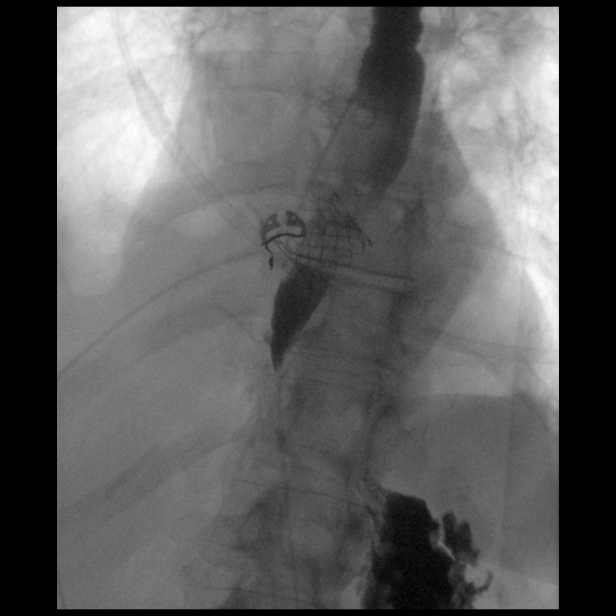

[Series 8: cp_standard · 0.17mm/px · 1 of 55 frames shown (8 of 9)]
[frame 9/55]
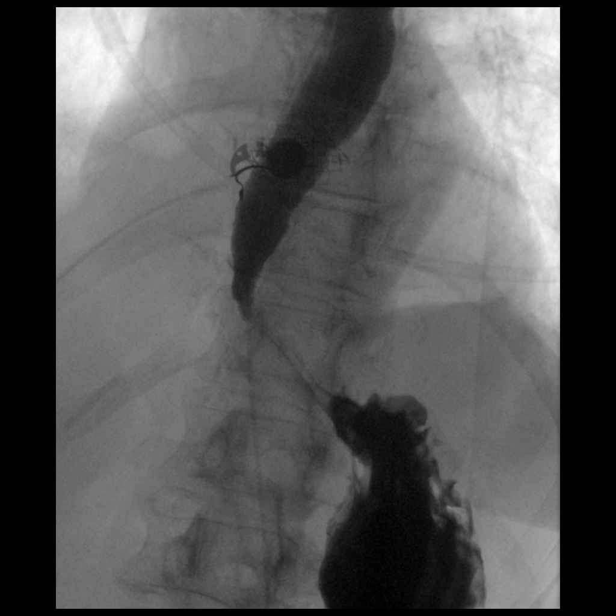

[Series 9: cp_standard · 0.18mm/px · 1 of 1 slices shown (9 of 9)]
[im 1/1]
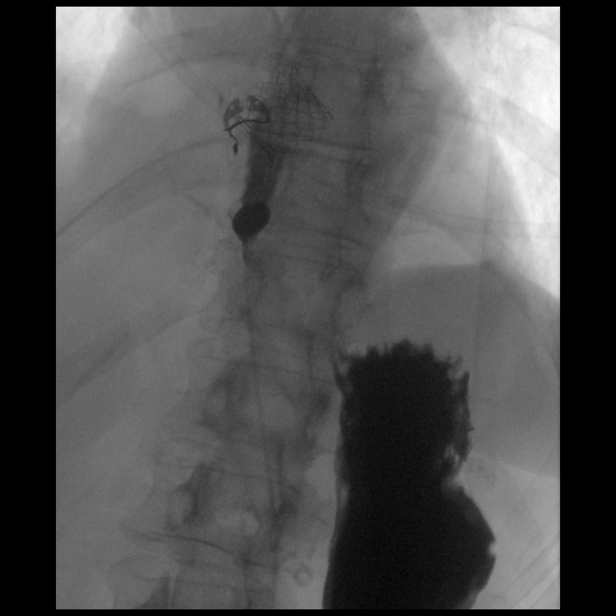

[14 of 24 positions shown; findings below may reference images not displayed]

FINDINGS: Esophageal distention: Normal without mass or stricture

Filling defects:  None

12.5 mm barium tablet: Passed from oral cavity to stomach without
obstruction

Motility: Markedly impaired esophageal motility with poor clearance
of barium by primary and secondary waves. Intermittent tertiary
waves and retrograde peristalsis noted.

Mucosa:  Smooth without irregularity or ulceration

Hypopharynx/cervical esophagus: Difficult to assess with patient
laying down, no gross laryngeal penetration or aspiration seen.

Hiatal hernia:  Absent

GE reflux:  Not witnessed during exam

Other:  N/A
IMPRESSION: Severe esophageal dysmotility.
